# Patient Record
Sex: Female | Born: 1955 | Race: White | Hispanic: No | Smoking: Never smoker
Health system: Southern US, Community
[De-identification: ages and names within clinical notes are randomized; demographics above are authoritative.]

## PROBLEM LIST (undated history)

## (undated) DIAGNOSIS — G4730 Sleep apnea, unspecified: Secondary | ICD-10-CM

## (undated) DIAGNOSIS — B019 Varicella without complication: Secondary | ICD-10-CM

## (undated) DIAGNOSIS — M1990 Unspecified osteoarthritis, unspecified site: Secondary | ICD-10-CM

## (undated) DIAGNOSIS — K219 Gastro-esophageal reflux disease without esophagitis: Secondary | ICD-10-CM

## (undated) DIAGNOSIS — R011 Cardiac murmur, unspecified: Secondary | ICD-10-CM

## (undated) DIAGNOSIS — I340 Nonrheumatic mitral (valve) insufficiency: Secondary | ICD-10-CM

## (undated) DIAGNOSIS — I4819 Other persistent atrial fibrillation: Secondary | ICD-10-CM

## (undated) DIAGNOSIS — G4733 Obstructive sleep apnea (adult) (pediatric): Secondary | ICD-10-CM

## (undated) DIAGNOSIS — I4891 Unspecified atrial fibrillation: Secondary | ICD-10-CM

## (undated) DIAGNOSIS — I428 Other cardiomyopathies: Secondary | ICD-10-CM

## (undated) DIAGNOSIS — K3189 Other diseases of stomach and duodenum: Secondary | ICD-10-CM

## (undated) DIAGNOSIS — Z87442 Personal history of urinary calculi: Secondary | ICD-10-CM

## (undated) DIAGNOSIS — T753XXA Motion sickness, initial encounter: Secondary | ICD-10-CM

## (undated) DIAGNOSIS — E119 Type 2 diabetes mellitus without complications: Secondary | ICD-10-CM

## (undated) DIAGNOSIS — M25559 Pain in unspecified hip: Secondary | ICD-10-CM

## (undated) DIAGNOSIS — E669 Obesity, unspecified: Secondary | ICD-10-CM

## (undated) DIAGNOSIS — I10 Essential (primary) hypertension: Secondary | ICD-10-CM

## (undated) DIAGNOSIS — I499 Cardiac arrhythmia, unspecified: Secondary | ICD-10-CM

## (undated) DIAGNOSIS — I5022 Chronic systolic (congestive) heart failure: Secondary | ICD-10-CM

## (undated) HISTORY — DX: Chronic systolic (congestive) heart failure: I50.22

## (undated) HISTORY — DX: Essential (primary) hypertension: I10

## (undated) HISTORY — DX: Nonrheumatic mitral (valve) insufficiency: I34.0

## (undated) HISTORY — DX: Unspecified osteoarthritis, unspecified site: M19.90

## (undated) HISTORY — DX: Pain in unspecified hip: M25.559

## (undated) HISTORY — DX: Sleep apnea, unspecified: G47.30

## (undated) HISTORY — DX: Other diseases of stomach and duodenum: K31.89

## (undated) HISTORY — DX: Other persistent atrial fibrillation: I48.19

## (undated) HISTORY — DX: Type 2 diabetes mellitus without complications: E11.9

## (undated) HISTORY — DX: Other cardiomyopathies: I42.8

## (undated) HISTORY — DX: Varicella without complication: B01.9

## (undated) HISTORY — DX: Unspecified atrial fibrillation: I48.91

## (undated) HISTORY — DX: Obstructive sleep apnea (adult) (pediatric): G47.33

## (undated) HISTORY — DX: Personal history of urinary calculi: Z87.442

## (undated) HISTORY — PX: CHOLECYSTECTOMY: SHX55

---

## 2009-05-05 ENCOUNTER — Ambulatory Visit: Payer: Self-pay | Admitting: Gastroenterology

## 2010-11-06 LAB — HM MAMMOGRAPHY

## 2011-07-24 ENCOUNTER — Ambulatory Visit: Payer: Self-pay

## 2011-07-24 LAB — RAPID INFLUENZA A&B ANTIGENS

## 2011-07-31 ENCOUNTER — Ambulatory Visit: Payer: Self-pay

## 2011-07-31 LAB — CBC WITH DIFFERENTIAL/PLATELET
Basophil #: 0.1 10*3/uL (ref 0.0–0.1)
Basophil %: 0.9 %
Eosinophil #: 0.2 10*3/uL (ref 0.0–0.7)
Eosinophil %: 2 %
HCT: 50 % — ABNORMAL HIGH (ref 35.0–47.0)
HGB: 17 g/dL — ABNORMAL HIGH (ref 12.0–16.0)
Lymphocyte #: 2.4 10*3/uL (ref 1.0–3.6)
Lymphocyte %: 30.1 %
MCH: 30.6 pg (ref 26.0–34.0)
MCHC: 34 g/dL (ref 32.0–36.0)
MCV: 90 fL (ref 80–100)
Monocyte #: 0.5 10*3/uL (ref 0.0–0.7)
Monocyte %: 6.5 %
Neutrophil #: 4.6 10*3/uL (ref 1.4–6.5)
Neutrophil %: 60.5 %
Platelet: 301 10*3/uL (ref 150–440)
RBC: 5.57 10*6/uL — ABNORMAL HIGH (ref 3.80–5.20)
RDW: 11.8 % (ref 11.5–14.5)
WBC: 7.8 10*3/uL (ref 3.6–11.0)

## 2011-07-31 LAB — COMPREHENSIVE METABOLIC PANEL
Albumin: 4.5 g/dL (ref 3.4–5.0)
Alkaline Phosphatase: 108 U/L (ref 50–136)
Anion Gap: 9 (ref 7–16)
BUN: 11 mg/dL (ref 7–18)
Bilirubin,Total: 0.5 mg/dL (ref 0.2–1.0)
Calcium, Total: 9.7 mg/dL (ref 8.5–10.1)
Chloride: 104 mmol/L (ref 98–107)
Co2: 28 mmol/L (ref 21–32)
Creatinine: 1.04 mg/dL (ref 0.60–1.30)
EGFR (African American): >60
EGFR (Non-African Amer.): 58 — ABNORMAL LOW
Glucose: 122 mg/dL — ABNORMAL HIGH (ref 65–99)
Osmolality: 282 (ref 275–301)
Potassium: 4.8 mmol/L (ref 3.5–5.1)
SGOT(AST): 27 U/L (ref 15–37)
SGPT (ALT): 63 U/L
Sodium: 141 mmol/L (ref 136–145)
Total Protein: 8.4 g/dL — ABNORMAL HIGH (ref 6.4–8.2)

## 2011-07-31 LAB — RAPID INFLUENZA A&B ANTIGENS

## 2012-08-28 ENCOUNTER — Ambulatory Visit: Payer: Self-pay | Admitting: Family Medicine

## 2012-08-28 LAB — RAPID INFLUENZA A&B ANTIGENS

## 2013-07-14 ENCOUNTER — Emergency Department: Payer: Self-pay | Admitting: Emergency Medicine

## 2013-08-19 ENCOUNTER — Ambulatory Visit: Payer: Self-pay | Admitting: Emergency Medicine

## 2013-08-19 LAB — RAPID STREP-A WITH REFLX: Micro Text Report: NEGATIVE

## 2013-08-21 LAB — BETA STREP CULTURE(ARMC)

## 2014-10-14 LAB — HM PAP SMEAR: HM Pap smear: NEGATIVE

## 2016-04-07 ENCOUNTER — Ambulatory Visit
Admission: EM | Admit: 2016-04-07 | Discharge: 2016-04-07 | Disposition: A | Discharge status: Home / Self Care | Payer: BC Managed Care – PPO | Attending: Family Medicine | Admitting: Family Medicine

## 2016-04-07 ENCOUNTER — Encounter: Payer: Self-pay | Admitting: Emergency Medicine

## 2016-04-07 DIAGNOSIS — J40 Bronchitis, not specified as acute or chronic: Secondary | ICD-10-CM

## 2016-04-07 MED ORDER — AZITHROMYCIN 250 MG PO TABS: 250.0000 mg | ORAL_TABLET | Freq: Every day | ORAL | 0 refills | Status: DC

## 2016-04-07 NOTE — ED Triage Notes (Signed)
Patient c/o cough and chest congestion since Tuesday.  Patient denies fevers.

## 2016-04-07 NOTE — ED Provider Notes (Signed)
[facility]    CSN: [id number] Arrival date & time: 04/07/16  [id number]     History   Chief Complaint Chief Complaint  Patient presents with  . Cough    HPI Brandy [name] is a 60 y.o. female.   Subjective:   Brandy [name] is a 60 y.o. female who presents for evaluation of productive cough. Symptoms began 3 day ago. Symptoms have been gradually worsening since that time. Past history is significant for nothing.  The following portions of the patient's history were reviewed and updated as appropriate: allergies, current medications, past family history, past medical history, past social history, past surgical history and problem list.  Review of Systems Pertinent items noted in HPI and remainder of comprehensive ROS otherwise negative.   Objective:  BP (!) 120/98 (BP Location: Left Arm)   Pulse 72   Temp 98.7 F (37.1 C) (Oral)   Resp 16   Ht 5\' 7"  (1.702 m)   Wt 250 lb (113.4 kg)   SpO2 98%   BMI 39.16 kg/m   General Appearance:    Alert, cooperative, no distress, appears stated age Head:    Normocephalic, without obvious abnormality, atraumatic Eyes:    PERRL, conjunctiva/corneas clear, EOM's intact, fundi    benign, both eyes Ears:    Normal TM's and external ear canals, both ears Nose:   Nares normal, septum midline, mucosa normal, no drainage    or sinus tenderness Throat:   Lips, mucosa, and tongue normal; teeth and gums normal Neck:   Supple, symmetrical, trachea midline, no adenopathy;    thyroid:  no enlargement/tenderness/nodules; no carotid   bruit or JVD  Lungs:     Clear to auscultation bilaterally, respirations unlabored Chest Wall:    No tenderness or deformity  Heart:    Regular rate and rhythm, S1 and S2 normal, no murmur, rub   or gallop  Abdomen:     Soft, non-tender, bowel sounds active all four quadrants,    no masses, no organomegaly   Extremities:   Extremities normal, atraumatic, no cyanosis or edema Pulses:   2+ and symmetric all  extremities Skin:   Skin color, texture, turgor normal, no rashes or lesions Lymph nodes:   Cervical, supraclavicular, and axillary nodes norm      Assessment:  Acute Bronchitis   Plan:  Worsening signs and symptoms discussed. Rest, fluids, acetaminophen, and humidification. Follow up as needed for persistent, worsening cough, or appearance of new symptoms.       History reviewed. No pertinent past medical history.  There are no active problems to display for this patient.   Past Surgical History:  Procedure Laterality Date  . CHOLECYSTECTOMY      OB History    No data available       Home Medications    Prior to Admission medications   Medication Sig Start Date End Date Taking? Authorizing Provider  azithromycin (ZITHROMAX) 250 MG tablet Take 1 tablet (250 mg total) by mouth daily. Take first 2 tablets together, then 1 every day until finished. 04/07/16   Juline Patch, MD    Family History History reviewed. No pertinent family history.  Social History Social History  Substance Use Topics  . Smoking status: Never Smoker  . Smokeless tobacco: Never Used  . Alcohol use No     Allergies   Patient has no known allergies.   Review of Systems Review of Systems  Constitutional: Negative for chills, fatigue and fever.  HENT: Negative for congestion, postnasal drip, sinus pain, sinus pressure, sneezing and sore throat.   Eyes: Negative for discharge and redness.  Respiratory: Negative for apnea, choking, chest tightness, shortness of breath and wheezing.   Cardiovascular: Negative for chest pain and leg swelling.  Gastrointestinal: Negative for abdominal distention and abdominal pain.  Endocrine: Negative.   Neurological: Negative for dizziness.  Psychiatric/Behavioral: The patient is not nervous/anxious.      Physical Exam Triage Vital Signs ED Triage Vitals  Enc Vitals Group     BP 04/07/16 0838 (!) 120/98     Pulse Rate 04/07/16 0838 72      Resp 04/07/16 0838 16     Temp 04/07/16 0838 98.7 F (37.1 C)     Temp Source 04/07/16 0838 Oral     SpO2 04/07/16 0838 98 %     Weight 04/07/16 0837 250 lb (113.4 kg)     Height 04/07/16 0837 5\' 7"  (1.702 m)     Head Circumference --      Peak Flow --      Pain Score 04/07/16 0840 2     Pain Loc --      Pain Edu? --      Excl. in [place]? --    No data found.   Updated Vital Signs BP (!) 120/98 (BP Location: Left Arm)   Pulse 72   Temp 98.7 F (37.1 C) (Oral)   Resp 16   Ht 5\' 7"  (1.702 m)   Wt 250 lb (113.4 kg)   SpO2 98%   BMI 39.16 kg/m   Visual Acuity Right Eye Distance:   Left Eye Distance:   Bilateral Distance:    Right Eye Near:   Left Eye Near:    Bilateral Near:     Physical Exam  Constitutional: She appears well-developed.  HENT:  Head: Normocephalic.  Right Ear: External ear normal.  Left Ear: External ear normal.  Nose: Nose normal.  Mouth/Throat: Oropharynx is clear and moist.  Eyes: Conjunctivae and EOM are normal. Pupils are equal, round, and reactive to light.  Neck: Normal range of motion. Neck supple.  Cardiovascular: Normal rate, regular rhythm, normal heart sounds and intact distal pulses.   Pulmonary/Chest: Effort normal and breath sounds normal.  Abdominal: Soft. Bowel sounds are normal. She exhibits no mass. There is no tenderness.     UC Treatments / Results  Labs (all labs ordered are listed, but only abnormal results are displayed) Labs Reviewed - No data to display  EKG  EKG Interpretation None       Radiology No results found.  Procedures Procedures (including critical care time)  Medications Ordered in UC Medications - No data to display   Initial Impression / Assessment and Plan / UC Course  I have reviewed the triage vital signs and the nursing notes.  Pertinent labs & imaging results that were available during my care of the patient were reviewed by me and considered in my medical decision making (see chart for  details).  Clinical Course       Final Clinical Impressions(s) / UC Diagnoses   Final diagnoses:  Bronchitis    New Prescriptions New Prescriptions   AZITHROMYCIN (ZITHROMAX) 250 MG TABLET    Take 1 tablet (250 mg total) by mouth daily. Take first 2 tablets together, then 1 every day until finished.     Juline Patch, MD 04/07/16 [phone]

## 2016-04-12 ENCOUNTER — Emergency Department: Payer: BC Managed Care – PPO

## 2016-04-12 ENCOUNTER — Inpatient Hospital Stay
Admission: EM | Admit: 2016-04-12 | Discharge: 2016-04-18 | DRG: 286 | Disposition: A | Discharge status: Home / Self Care | Payer: BC Managed Care – PPO | Attending: Internal Medicine | Admitting: Internal Medicine

## 2016-04-12 DIAGNOSIS — I428 Other cardiomyopathies: Secondary | ICD-10-CM

## 2016-04-12 DIAGNOSIS — R000 Tachycardia, unspecified: Secondary | ICD-10-CM | POA: Diagnosis present

## 2016-04-12 DIAGNOSIS — J40 Bronchitis, not specified as acute or chronic: Secondary | ICD-10-CM | POA: Diagnosis not present

## 2016-04-12 DIAGNOSIS — I5021 Acute systolic (congestive) heart failure: Secondary | ICD-10-CM | POA: Diagnosis present

## 2016-04-12 DIAGNOSIS — Z6833 Body mass index (BMI) 33.0-33.9, adult: Secondary | ICD-10-CM

## 2016-04-12 DIAGNOSIS — R0603 Acute respiratory distress: Secondary | ICD-10-CM | POA: Diagnosis present

## 2016-04-12 DIAGNOSIS — I4819 Other persistent atrial fibrillation: Secondary | ICD-10-CM | POA: Diagnosis present

## 2016-04-12 DIAGNOSIS — Z6838 Body mass index (BMI) 38.0-38.9, adult: Secondary | ICD-10-CM

## 2016-04-12 DIAGNOSIS — I481 Persistent atrial fibrillation: Secondary | ICD-10-CM | POA: Diagnosis not present

## 2016-04-12 DIAGNOSIS — J811 Chronic pulmonary edema: Secondary | ICD-10-CM | POA: Diagnosis present

## 2016-04-12 DIAGNOSIS — I4891 Unspecified atrial fibrillation: Secondary | ICD-10-CM | POA: Diagnosis not present

## 2016-04-12 DIAGNOSIS — R0602 Shortness of breath: Secondary | ICD-10-CM

## 2016-04-12 DIAGNOSIS — I5042 Chronic combined systolic (congestive) and diastolic (congestive) heart failure: Secondary | ICD-10-CM

## 2016-04-12 DIAGNOSIS — I420 Dilated cardiomyopathy: Secondary | ICD-10-CM | POA: Diagnosis not present

## 2016-04-12 DIAGNOSIS — J810 Acute pulmonary edema: Secondary | ICD-10-CM | POA: Diagnosis not present

## 2016-04-12 DIAGNOSIS — Z9049 Acquired absence of other specified parts of digestive tract: Secondary | ICD-10-CM | POA: Diagnosis not present

## 2016-04-12 HISTORY — DX: Obesity, unspecified: E66.9

## 2016-04-12 LAB — CBC WITH DIFFERENTIAL/PLATELET
Basophils Absolute: 0.1 10*3/uL (ref 0–0.1)
Basophils Relative: 1 %
Eosinophils Absolute: 0.1 10*3/uL (ref 0–0.7)
Eosinophils Relative: 1 %
HCT: 42.7 % (ref 35.0–47.0)
Hemoglobin: 14.6 g/dL (ref 12.0–16.0)
Lymphocytes Relative: 25 %
Lymphs Abs: 2.5 10*3/uL (ref 1.0–3.6)
MCH: 30.3 pg (ref 26.0–34.0)
MCHC: 34.2 g/dL (ref 32.0–36.0)
MCV: 88.7 fL (ref 80.0–100.0)
Monocytes Absolute: 1.1 10*3/uL — ABNORMAL HIGH (ref 0.2–0.9)
Monocytes Relative: 11 %
Neutro Abs: 6.1 10*3/uL (ref 1.4–6.5)
Neutrophils Relative %: 62 %
Platelets: 340 10*3/uL (ref 150–440)
RBC: 4.82 MIL/uL (ref 3.80–5.20)
RDW: 13.2 % (ref 11.5–14.5)
WBC: 10 10*3/uL (ref 3.6–11.0)

## 2016-04-12 LAB — BASIC METABOLIC PANEL
Anion gap: 7 (ref 5–15)
BUN: 16 mg/dL (ref 6–20)
CO2: 25 mmol/L (ref 22–32)
Calcium: 9.2 mg/dL (ref 8.9–10.3)
Chloride: 106 mmol/L (ref 101–111)
Creatinine, Ser: 1.08 mg/dL — ABNORMAL HIGH (ref 0.44–1.00)
GFR calc Af Amer: >60 mL/min (ref >60)
GFR calc non Af Amer: 54 mL/min — ABNORMAL LOW (ref >60)
Glucose, Bld: 122 mg/dL — ABNORMAL HIGH (ref 65–99)
Potassium: 4.1 mmol/L (ref 3.5–5.1)
Sodium: 138 mmol/L (ref 135–145)

## 2016-04-12 LAB — GLUCOSE, CAPILLARY: Glucose-Capillary: 123 mg/dL — ABNORMAL HIGH (ref 65–99)

## 2016-04-12 LAB — TROPONIN I
Troponin I: <0.03 ng/mL (ref <0.03)
Troponin I: <0.03 ng/mL (ref <0.03)

## 2016-04-12 LAB — BRAIN NATRIURETIC PEPTIDE: B Natriuretic Peptide: 297 pg/mL — ABNORMAL HIGH (ref 0.0–100.0)

## 2016-04-12 LAB — TSH: TSH: 3.04 u[IU]/mL (ref 0.350–4.500)

## 2016-04-12 MED ORDER — ASPIRIN 81 MG PO CHEW
324.0000 mg | CHEWABLE_TABLET | Freq: Once | ORAL | Status: AC
  Administered 2016-04-12: 324 mg via ORAL
  Filled 2016-04-12: qty 4

## 2016-04-12 MED ORDER — DILTIAZEM HCL 25 MG/5ML IV SOLN
20.0000 mg | Freq: Once | INTRAVENOUS | Status: AC
  Administered 2016-04-12: 20 mg via INTRAVENOUS
  Filled 2016-04-12: qty 5

## 2016-04-12 MED ORDER — ACETAMINOPHEN 325 MG PO TABS
650.0000 mg | ORAL_TABLET | Freq: Four times a day (QID) | ORAL | Status: DC | PRN
  Administered 2016-04-17: 650 mg via ORAL
  Filled 2016-04-12: qty 2

## 2016-04-12 MED ORDER — ALBUTEROL SULFATE (2.5 MG/3ML) 0.083% IN NEBU
5.0000 mg | INHALATION_SOLUTION | Freq: Once | RESPIRATORY_TRACT | Status: AC
  Administered 2016-04-12: 5 mg via RESPIRATORY_TRACT
  Filled 2016-04-12: qty 6

## 2016-04-12 MED ORDER — ONDANSETRON HCL 4 MG PO TABS: 4.0000 mg | ORAL_TABLET | Freq: Four times a day (QID) | ORAL | Status: DC | PRN

## 2016-04-12 MED ORDER — GUAIFENESIN-DM 100-10 MG/5ML PO SYRP
5.0000 mL | ORAL_SOLUTION | ORAL | Status: DC | PRN
  Administered 2016-04-12 – 2016-04-13 (×3): 5 mL via ORAL
  Filled 2016-04-12 (×3): qty 5

## 2016-04-12 MED ORDER — FUROSEMIDE 10 MG/ML IJ SOLN: 20.0000 mg | Freq: Once | INTRAMUSCULAR | Status: DC

## 2016-04-12 MED ORDER — ONDANSETRON HCL 4 MG/2ML IJ SOLN
4.0000 mg | Freq: Four times a day (QID) | INTRAMUSCULAR | Status: DC | PRN
Start: 2016-04-12 — End: 2016-04-18

## 2016-04-12 MED ORDER — ACETAMINOPHEN 650 MG RE SUPP: 650.0000 mg | Freq: Four times a day (QID) | RECTAL | Status: DC | PRN

## 2016-04-12 MED ORDER — DILTIAZEM HCL 100 MG IV SOLR
5.0000 mg/h | INTRAVENOUS | Status: DC
  Administered 2016-04-12: 5 mg/h via INTRAVENOUS
  Administered 2016-04-13: 13 mg/h via INTRAVENOUS
  Filled 2016-04-12 (×2): qty 100

## 2016-04-12 MED ORDER — IPRATROPIUM-ALBUTEROL 0.5-2.5 (3) MG/3ML IN SOLN
3.0000 mL | RESPIRATORY_TRACT | Status: DC | PRN
  Administered 2016-04-13 (×2): 3 mL via RESPIRATORY_TRACT
  Filled 2016-04-12 (×2): qty 3

## 2016-04-12 MED ORDER — OXYCODONE HCL 5 MG PO TABS: 5.0000 mg | ORAL_TABLET | ORAL | Status: DC | PRN

## 2016-04-12 MED ORDER — HYDROCORTISONE 1 % EX CREA
TOPICAL_CREAM | CUTANEOUS | Status: DC | PRN
  Administered 2016-04-12: via TOPICAL
  Filled 2016-04-12: qty 28

## 2016-04-12 MED ORDER — SODIUM CHLORIDE 0.9 % IV BOLUS (SEPSIS)
500.0000 mL | Freq: Once | INTRAVENOUS | Status: AC
  Administered 2016-04-12: 500 mL via INTRAVENOUS

## 2016-04-12 MED ORDER — SODIUM CHLORIDE 0.9% FLUSH
3.0000 mL | Freq: Two times a day (BID) | INTRAVENOUS | Status: DC
  Administered 2016-04-12 – 2016-04-18 (×9): 3 mL via INTRAVENOUS

## 2016-04-12 MED ORDER — DILTIAZEM HCL 100 MG IV SOLR
5.0000 mg/h | Freq: Once | INTRAVENOUS | Status: DC
  Filled 2016-04-12: qty 100

## 2016-04-12 MED ORDER — ENOXAPARIN SODIUM 120 MG/0.8ML ~~LOC~~ SOLN: 1.0000 mg/kg | Freq: Two times a day (BID) | SUBCUTANEOUS | Status: DC

## 2016-04-12 NOTE — H&P (Signed)
[facility] [place] NAME: Brandy [name]    MR#:  [id number]  DATE OF BIRTH:  1956-04-04   DATE OF ADMISSION:  04/12/2016  PRIMARY CARE PHYSICIAN: Elgie Collard, MD   REQUESTING/REFERRING PHYSICIAN: McShane  CHIEF COMPLAINT:   Chief Complaint  Patient presents with  . Shortness of Breath    HISTORY OF PRESENT ILLNESS:  Brandy [name]  is a 60 y.o. female without significant medical history who is presenting with shortness of breath. She states she recently has recovered from bronchitis received a Z-Pak but shortness of breath continued mainly dyspnea on exertion worsening over the past 2-3 days. She also states "I had a funny feeling in my chest" as her breathing became more symptomatic she is at present to Hospital further workup and evaluation where she was found to have heart rates in the 140s to 150s.    PAST MEDICAL HISTORY:  History reviewed. No pertinent past medical history.  PAST SURGICAL HISTORY:   Past Surgical History:  Procedure Laterality Date  . CHOLECYSTECTOMY      SOCIAL HISTORY:   Social History  Substance Use Topics  . Smoking status: Never Smoker  . Smokeless tobacco: Never Used  . Alcohol use No    FAMILY HISTORY:   Family History  Problem Relation Age of Onset  . Diabetes Neg Hx     DRUG ALLERGIES:  No Known Allergies  REVIEW OF SYSTEMS:  REVIEW OF SYSTEMS:  CONSTITUTIONAL: Denies fevers, chills, fatigue, weakness.  EYES: Denies blurred vision, double vision, or eye pain.  EARS, NOSE, THROAT: Denies tinnitus, ear pain, hearing loss.  RESPIRATORY: denies cough, shortness of breath, wheezing Positive dyspnea on exertion CARDIOVASCULAR: Denies chest pain, positive palpitations, denies edema.  GASTROINTESTINAL: Denies nausea, vomiting, diarrhea, abdominal pain.  GENITOURINARY: Denies dysuria, hematuria.  ENDOCRINE: Denies nocturia or thyroid problems. HEMATOLOGIC AND LYMPHATIC: Denies easy  bruising or bleeding.  SKIN: Denies rash or lesions.  MUSCULOSKELETAL: Denies pain in neck, back, shoulder, knees, hips, or further arthritic symptoms.  NEUROLOGIC: Denies paralysis, paresthesias.  PSYCHIATRIC: Denies anxiety or depressive symptoms. Otherwise full review of systems performed by me is negative.   MEDICATIONS AT HOME:   Prior to Admission medications   Medication Sig Start Date End Date Taking? Authorizing Provider  azithromycin (ZITHROMAX) 250 MG tablet Take 1 tablet (250 mg total) by mouth daily. Take first 2 tablets together, then 1 every day until finished. 04/07/16   Juline Patch, MD      VITAL SIGNS:  Blood pressure 118/89, pulse (!) 106, temperature 97.5 F (36.4 C), temperature source Oral, resp. rate 10, height 5\' 7"  (1.702 m), weight 112.5 kg (248 lb), SpO2 100 %.  PHYSICAL EXAMINATION:  VITAL SIGNS: Vitals:   04/12/16 1859 04/12/16 1901  BP:  118/89  Pulse: (!) 124 (!) 106  Resp: 17 10  Temp:     GENERAL:60 y.o.female currently in no acute distress.  HEAD: Normocephalic, atraumatic.  EYES: Pupils equal, round, reactive to light. Extraocular muscles intact. No scleral icterus.  MOUTH: Moist mucosal membrane. Dentition intact. No abscess noted.  EAR, NOSE, THROAT: Clear without exudates. No external lesions.  NECK: Supple. No thyromegaly. No nodules. No JVD.  PULMONARY: Clear to ascultation, without wheeze rails or rhonci. No use of accessory muscles, Good respiratory effort. good air entry bilaterally CHEST: Nontender to palpation.  CARDIOVASCULAR: S1 and S2. Irregular rate and rhythm. No murmurs, rubs, or gallops. No edema. Pedal pulses 2+ bilaterally.  GASTROINTESTINAL:  Soft, nontender, nondistended. No masses. Positive bowel sounds. No hepatosplenomegaly.  MUSCULOSKELETAL: No swelling, clubbing, or edema. Range of motion full in all extremities.  NEUROLOGIC: Cranial nerves II through XII are intact. No gross focal neurological deficits. Sensation  intact. Reflexes intact.  SKIN: No ulceration, lesions, rashes, or cyanosis. Skin warm and dry. Turgor intact.  PSYCHIATRIC: Mood, affect within normal limits. The patient is awake, alert and oriented x 3. Insight, judgment intact.    LABORATORY PANEL:   CBC  Recent Labs Lab 04/12/16 1818  WBC 10.0  HGB 14.6  HCT 42.7  PLT 340   ------------------------------------------------------------------------------------------------------------------  Chemistries   Recent Labs Lab 04/12/16 1818  NA 138  K 4.1  CL 106  CO2 25  GLUCOSE 122*  BUN 16  CREATININE 1.08*  CALCIUM 9.2   ------------------------------------------------------------------------------------------------------------------  Cardiac Enzymes  Recent Labs Lab 04/12/16 1818  TROPONINI <0.03   ------------------------------------------------------------------------------------------------------------------  RADIOLOGY:  Dg Chest Port 1 View  Result Date: 04/12/2016 CLINICAL DATA:  Bronchitis.  Shortness of breath. EXAM: PORTABLE CHEST 1 VIEW COMPARISON:  Two-view chest x-ray 07/31/2011. FINDINGS: The heart size is exaggerated by low lung volumes. Moderate pulmonary vascular congestion and mild edema are present. Bibasilar airspace disease likely reflects atelectasis. The visualized soft tissues and bony thorax are unremarkable. IMPRESSION: 1. Borderline cardiomegaly with moderate pulmonary vascular congestion and mild bilateral edema. 2. Mild bibasilar airspace disease likely reflects atelectasis. Electronically Signed   By: San Morelle M.D.   On: 04/12/2016 18:48    EKG:   Orders placed or performed during the hospital encounter of 04/12/16  . EKG 12-Lead  . EKG 12-Lead  . ED EKG  . ED EKG    IMPRESSION AND PLAN:   60 year old Caucasian female without significant history presenting with A. fib RVR  1. New onset atrial fibrillation rapid ventricular response: chadsvasc: 1-low risk, start  Cardizem for rate control, cardiology consult, echocardiogram, troponins    All the records are reviewed and case discussed with ED provider. Management plans discussed with the patient, family and they are in agreement.  CODE STATUS: Full  TOTAL TIME TAKING CARE OF THIS PATIENT: 33 critical minutes.    Hower,  Karenann Cai.D on 04/12/2016 at 8:15 PM  Between 7am to 6pm - Pager - [phone]  After 6pm: House Pager: - [phone]  [facility]  Office  [phone]  CC: Primary care physician; Elgie Collard, MD

## 2016-04-12 NOTE — ED Triage Notes (Signed)
Pt states last week she was dx with bronchitis and given z-pack. Finished zpack yesterday. Still have SOB with exertion. Taking mucinex. Has not taken today. HR jumping around in triage, from 120-140. Pt denies CP, N&V, diarrhea, fever. Says if she sits still she is fine.

## 2016-04-12 NOTE — ED Notes (Signed)
Pt lying on side, resting comfortably. Daughter in room.

## 2016-04-12 NOTE — ED Provider Notes (Signed)
[facility] Emergency Department Provider Note  ____________________________________________   I have reviewed the triage vital signs and the nursing notes.   HISTORY  Chief Complaint Shortness of Breath    HPI Brandy [name] is a 60 y.o. female was baseline healthy. Patient has had a cough for the last week. She took antibiotics for this. The cough is actually improving, however, today she noted palpitations. Patient has had palpitations in the past.  She has never had palpitations last for any length of time, however she's felt "weird since this morning and had definite palpitations since this afternoon at around noon. She's had no chest pain. However she does have dyspnea on exertion which is worse than she has had with this cold. She denies fever or chills.      History reviewed. No pertinent past medical history.  There are no active problems to display for this patient.   Past Surgical History:  Procedure Laterality Date  . CHOLECYSTECTOMY      Prior to Admission medications   Medication Sig Start Date End Date Taking? Authorizing Provider  azithromycin (ZITHROMAX) 250 MG tablet Take 1 tablet (250 mg total) by mouth daily. Take first 2 tablets together, then 1 every day until finished. 04/07/16   Juline Patch, MD    Allergies Patient has no known allergies.  History reviewed. No pertinent family history.  Social History Social History  Substance Use Topics  . Smoking status: Never Smoker  . Smokeless tobacco: Never Used  . Alcohol use No    Review of Systems }Constitutional: No fever/chills Eyes: No visual changes. ENT: No sore throat. No stiff neck no neck pain Cardiovascular: Denies chest pain. Respiratory: Positive shortness of breath. Gastrointestinal:   no vomiting.  No diarrhea.  No constipation. Genitourinary: Negative for dysuria. Musculoskeletal: Negative lower extremity swelling Skin: Negative for  rash. Neurological: Negative for severe headaches, focal weakness or numbness. 10-point ROS otherwise negative.  ____________________________________________   PHYSICAL EXAM:  VITAL SIGNS: ED Triage Vitals [04/12/16 1815]  Enc Vitals Group     BP 129/85     Pulse Rate (!) 141     Resp 18     Temp 97.5 F (36.4 C)     Temp Source Oral     SpO2 98 %     Weight 248 lb (112.5 kg)     Height 5\' 7"  (1.702 m)     Head Circumference      Peak Flow      Pain Score 0     Pain Loc      Pain Edu?      Excl. in [place]?     Constitutional: Alert and oriented. Well appearing and in no acute distress. Eyes: Conjunctivae are normal. PERRL. EOMI. Head: Atraumatic. Nose: No congestion/rhinnorhea. Mouth/Throat: Mucous membranes are moist.  Oropharynx non-erythematous. Neck: No stridor.   Nontender with no meningismus Cardiovascular: Riccardi irregularly irregular. Grossly normal heart sounds.  Good peripheral circulation. Respiratory: Normal respiratory effort.  No retractions. Lungs CTAB. Abdominal: Soft and nontender. No distention. No guarding no rebound Back:  There is no focal tenderness or step off.  there is no midline tenderness there are no lesions noted. there is no CVA tenderness Musculoskeletal: No lower extremity tenderness, no upper extremity tenderness. No joint effusions, no DVT signs strong distal pulses no edema Neurologic:  Normal speech and language. No gross focal neurologic deficits are appreciated.  Skin:  Skin is warm, dry and intact. No  rash noted. Psychiatric: Mood and affect are normal. Speech and behavior are normal.  ____________________________________________   LABS (all labs ordered are listed, but only abnormal results are displayed)  Labs Reviewed  TROPONIN I  BRAIN NATRIURETIC PEPTIDE  CBC WITH DIFFERENTIAL/PLATELET  BASIC METABOLIC PANEL   ____________________________________________  EKG  I personally interpreted any EKGs ordered by me or  triage Atrial fibrillation rate 134 bpm no acute ST elevation or acute ST depression normal axis ____________________________________________  RADIOLOGY  I reviewed any imaging ordered by me or triage that were performed during my shift and, if possible, patient and/or family made aware of any abnormal findings. ____________________________________________   PROCEDURES  Procedure(s) performed: None  Procedures  Critical Care performed: None  ____________________________________________   INITIAL IMPRESSION / ASSESSMENT AND PLAN / ED COURSE  Pertinent labs & imaging results that were available during my care of the patient were reviewed by me and considered in my medical decision making (see chart for details).  Patient in A. fib with RVR, has had apparently proximal A. fib in the past presents again with A. fib at this time. She's not having chest pain. She is however having exertional dyspnea. This apparently comes shortly after a URI which is resolving. We will obtain customary blood work including TSH, I will give her Cardizem to bring her heart rate down, her pressures are good. We will give her aspirin to start with that she has a low chads score and we'll reassess.  Clinical Course    ____________________________________________   FINAL CLINICAL IMPRESSION(S) / ED DIAGNOSES  Final diagnoses:  SOB (shortness of breath)      This chart was dictated using voice recognition software.  Despite best efforts to proofread,  errors can occur which can change meaning.      Schuyler Amor, MD 04/12/16 [phone]

## 2016-04-13 ENCOUNTER — Inpatient Hospital Stay (HOSPITAL_COMMUNITY)
Admit: 2016-04-13 | Discharge: 2016-04-13 | Disposition: A | Discharge status: Home / Self Care | Payer: BC Managed Care – PPO | Attending: Internal Medicine | Admitting: Internal Medicine

## 2016-04-13 ENCOUNTER — Encounter: Payer: Self-pay | Admitting: Physician Assistant

## 2016-04-13 DIAGNOSIS — J810 Acute pulmonary edema: Secondary | ICD-10-CM

## 2016-04-13 DIAGNOSIS — J811 Chronic pulmonary edema: Secondary | ICD-10-CM | POA: Diagnosis present

## 2016-04-13 DIAGNOSIS — I4891 Unspecified atrial fibrillation: Secondary | ICD-10-CM

## 2016-04-13 DIAGNOSIS — J40 Bronchitis, not specified as acute or chronic: Secondary | ICD-10-CM | POA: Diagnosis present

## 2016-04-13 DIAGNOSIS — R0603 Acute respiratory distress: Secondary | ICD-10-CM | POA: Diagnosis present

## 2016-04-13 LAB — MRSA PCR SCREENING: MRSA by PCR: NEGATIVE

## 2016-04-13 LAB — CBC
HCT: 42.7 % (ref 35.0–47.0)
Hemoglobin: 14.4 g/dL (ref 12.0–16.0)
MCH: 29.8 pg (ref 26.0–34.0)
MCHC: 33.6 g/dL (ref 32.0–36.0)
MCV: 88.7 fL (ref 80.0–100.0)
Platelets: 310 10*3/uL (ref 150–440)
RBC: 4.82 MIL/uL (ref 3.80–5.20)
RDW: 13.7 % (ref 11.5–14.5)
WBC: 12.6 10*3/uL — ABNORMAL HIGH (ref 3.6–11.0)

## 2016-04-13 LAB — ECHOCARDIOGRAM COMPLETE
Height: 67 in
Weight: 3950.64 oz

## 2016-04-13 LAB — MAGNESIUM: Magnesium: 2.1 mg/dL (ref 1.7–2.4)

## 2016-04-13 LAB — BASIC METABOLIC PANEL
Anion gap: 8 (ref 5–15)
BUN: 15 mg/dL (ref 6–20)
CO2: 20 mmol/L — ABNORMAL LOW (ref 22–32)
Calcium: 8.6 mg/dL — ABNORMAL LOW (ref 8.9–10.3)
Chloride: 111 mmol/L (ref 101–111)
Creatinine, Ser: 0.98 mg/dL (ref 0.44–1.00)
GFR calc Af Amer: >60 mL/min (ref >60)
GFR calc non Af Amer: >60 mL/min (ref >60)
Glucose, Bld: 149 mg/dL — ABNORMAL HIGH (ref 65–99)
Potassium: 3.7 mmol/L (ref 3.5–5.1)
Sodium: 139 mmol/L (ref 135–145)

## 2016-04-13 LAB — TROPONIN I
Troponin I: <0.03 ng/mL (ref <0.03)
Troponin I: <0.03 ng/mL (ref <0.03)

## 2016-04-13 LAB — PROTIME-INR
INR: 1.15
Prothrombin Time: 14.8 seconds (ref 11.4–15.2)

## 2016-04-13 LAB — APTT: aPTT: 31 seconds (ref 24–36)

## 2016-04-13 LAB — HEPARIN LEVEL (UNFRACTIONATED): Heparin Unfractionated: 0.19 IU/mL — ABNORMAL LOW (ref 0.30–0.70)

## 2016-04-13 MED ORDER — HEPARIN BOLUS VIA INFUSION
5000.0000 [IU] | Freq: Once | INTRAVENOUS | Status: AC
  Administered 2016-04-13: 5000 [IU] via INTRAVENOUS
  Filled 2016-04-13: qty 5000

## 2016-04-13 MED ORDER — FUROSEMIDE 10 MG/ML IJ SOLN
20.0000 mg | Freq: Two times a day (BID) | INTRAMUSCULAR | Status: DC
  Administered 2016-04-13 – 2016-04-16 (×7): 20 mg via INTRAVENOUS
  Filled 2016-04-13 (×7): qty 2

## 2016-04-13 MED ORDER — METOPROLOL TARTRATE 25 MG PO TABS
12.5000 mg | ORAL_TABLET | Freq: Four times a day (QID) | ORAL | Status: DC
  Administered 2016-04-13: 12.5 mg via ORAL
  Filled 2016-04-13: qty 1

## 2016-04-13 MED ORDER — POTASSIUM CHLORIDE CRYS ER 10 MEQ PO TBCR
10.0000 meq | EXTENDED_RELEASE_TABLET | Freq: Two times a day (BID) | ORAL | Status: DC
  Administered 2016-04-13 – 2016-04-16 (×8): 10 meq via ORAL
  Filled 2016-04-13 (×8): qty 1

## 2016-04-13 MED ORDER — HEPARIN (PORCINE) IN NACL 100-0.45 UNIT/ML-% IJ SOLN
1600.0000 [IU]/h | INTRAMUSCULAR | Status: DC
  Administered 2016-04-13: 1300 [IU]/h via INTRAVENOUS
  Administered 2016-04-14 – 2016-04-16 (×5): 1600 [IU]/h via INTRAVENOUS
  Filled 2016-04-13 (×7): qty 250

## 2016-04-13 MED ORDER — LORAZEPAM 0.5 MG PO TABS
0.5000 mg | ORAL_TABLET | ORAL | Status: DC | PRN
  Administered 2016-04-13 (×2): 0.5 mg via ORAL
  Filled 2016-04-13 (×2): qty 1

## 2016-04-13 MED ORDER — ESMOLOL HCL-SODIUM CHLORIDE 2000 MG/100ML IV SOLN
25.0000 ug/kg/min | INTRAVENOUS | Status: DC
  Administered 2016-04-13: 50 ug/kg/min via INTRAVENOUS
  Administered 2016-04-13: 500 ug/kg/min via INTRAVENOUS
  Administered 2016-04-13: 25 ug/kg/min via INTRAVENOUS
  Administered 2016-04-14: 50 ug/kg/min via INTRAVENOUS
  Administered 2016-04-14: 25 ug/kg/min via INTRAVENOUS
  Administered 2016-04-14: 50 ug/kg/min via INTRAVENOUS
  Filled 2016-04-13 (×3): qty 100

## 2016-04-13 MED ORDER — HEPARIN BOLUS VIA INFUSION
2600.0000 [IU] | Freq: Once | INTRAVENOUS | Status: AC
  Administered 2016-04-13: 2600 [IU] via INTRAVENOUS
  Filled 2016-04-13: qty 2600

## 2016-04-13 NOTE — Progress Notes (Signed)
   EF found to be reduced at 15% on echo. Discussed with MD. Plan to start esmolol infusion in place of Cardizem gtt and PO Lopressor. Titrate to goal HR < 100 bpm. Will need to continue IV Lasix bid with KCl repletion given pleural effusion seen on echo. Will need R/LHC prior to discharge after her heart rate is improved as well as she has been diuresed. Cardiac cath likely on Monday 12/4.

## 2016-04-13 NOTE — Progress Notes (Signed)
ANTICOAGULATION CONSULT NOTE - Initial Consult  Pharmacy Consult for heparin bolus and drip Indication: atrial fibrillation  No Known Allergies  Patient Measurements: Height: 5\' 7"  (170.2 cm) Weight: 246 lb 14.6 oz (112 kg) IBW/kg (Calculated) : 61.6 Heparin Dosing Weight: 87.5 kg  Vital Signs: Temp: 97.2 F (36.2 C) (11/30 1907) Temp Source: Oral (11/30 1907) BP: 99/70 (11/30 1930) Pulse Rate: 124 (11/30 1930)  Labs:  Recent Labs  04/12/16 1818 04/12/16 2246 04/13/16 0450 04/13/16 1033 04/13/16 1845  HGB 14.6  --  14.4  --   --   HCT 42.7  --  42.7  --   --   PLT 340  --  310  --   --   APTT  --   --   --  31  --   LABPROT  --   --   --  14.8  --   INR  --   --   --  1.15  --   HEPARINUNFRC  --   --   --   --  0.19*  CREATININE 1.08*  --  0.98  --   --   TROPONINI <0.03 <0.03 <0.03 <0.03  --     Estimated Creatinine Clearance: 77.8 mL/min (by C-G formula based on SCr of 0.98 mg/dL).   Medical History: Past Medical History:  Diagnosis Date  . Obesity     Medications:  Scheduled:  . furosemide  20 mg Intravenous BID  . potassium chloride  10 mEq Oral BID  . sodium chloride flush  3 mL Intravenous Q12H   Infusions:  . esmolol 50 mcg/kg/min (04/13/16 1907)  . heparin 1,300 Units/hr (04/13/16 1907)    Assessment: Pharmacy has been consulted to dose heparin bolus and drip in this 17 yoF with ?new onset afib with RVR, uncertain when pt went into Afib as she was having increased SOB throughout the previous week. Rate has been difficult to control. Per cardio note, heparin has been initiated in case pt requires DCCV prior to discharge and due to low CHADS2VASc (1), pt should not require long term anticoagulation. According to PTA medication list, patient was not taking OC prior to admission. Baseline labs WNL.  Goal of Therapy:  Heparin level 0.3-0.7 units/ml Monitor platelets by anticoagulation protocol: Yes   Plan:  Give 5000 units bolus x 1 Start  heparin infusion at 1300 units/hr Check anti-Xa level in 6 hours and daily while on heparin Continue to monitor H&H and platelets   11/30:  HL @ 17:00 = 0.19 Will order heparin 2600 units IV X 1 bolus and increase drip rate to 1600 units.  Will recheck HL 6 hrs after rate change.   [place] Resident 04/13/2016 9:49 PM

## 2016-04-13 NOTE — Progress Notes (Signed)
[facility] [place] NAME: Brandy [name]    MR#:  [id number]  DATE OF BIRTH:  06-16-1955  SUBJECTIVE:seen at bed side.admitted for SOB,afib with RVR.still having cough.HR not controlled with cardizem drip.  CHIEF COMPLAINT:   Chief Complaint  Patient presents with  . Shortness of Breath    REVIEW OF SYSTEMS:   ROS CONSTITUTIONAL: fatigue. EYES: No blurred or double vision.  EARS, NOSE, AND THROAT: No tinnitus or ear pain.  RESPIRATORY: No cough, shortness of breath, wheezing or hemoptysis.  CARDIOVASCULAR: No chest pain, orthopnea, edema.  GASTROINTESTINAL: No nausea, vomiting, diarrhea or abdominal pain.  GENITOURINARY: No dysuria, hematuria.  ENDOCRINE: No polyuria, nocturia,  HEMATOLOGY: No anemia, easy bruising or bleeding SKIN: No rash or lesion. MUSCULOSKELETAL: No joint pain or arthritis.   NEUROLOGIC: No tingling, numbness, weakness.  PSYCHIATRY: No anxiety or depression.   DRUG ALLERGIES:  No Known Allergies  VITALS:  Blood pressure 102/60, pulse (!) 135, temperature 98.3 F (36.8 C), temperature source Axillary, resp. rate (!) 25, height 5\' 7"  (1.702 m), weight 112 kg (246 lb 14.6 oz), SpO2 94 %.  PHYSICAL EXAMINATION:  GENERAL:  60 y.o.-year-old patient lying in the bed with no acute distress.  EYES: Pupils equal, round, reactive to light and accommodation. No scleral icterus. Extraocular muscles intact.  HEENT: Head atraumatic, normocephalic. Oropharynx and nasopharynx clear.  NECK:  Supple, no jugular venous distention. No thyroid enlargement, no tenderness.  LUNGS: bilateral basal creps .  CARDIOVASCULAR: S1, S2 irregular,tachycardic.[name] No murmurs, rubs, or gallops.  ABDOMEN: Soft, nontender, nondistended. Bowel sounds present. No organomegaly or mass.  EXTREMITIES: No pedal edema, cyanosis, or clubbing.  NEUROLOGIC: Cranial nerves II through XII are intact. Muscle strength 5/5 in all extremities. Sensation  intact. Gait not checked.  PSYCHIATRIC: The patient is alert and oriented x 3.  SKIN: No obvious rash, lesion, or ulcer.    LABORATORY PANEL:   CBC  Recent Labs Lab 04/13/16 0450  WBC 12.6*  HGB 14.4  HCT 42.7  PLT 310   ------------------------------------------------------------------------------------------------------------------  Chemistries   Recent Labs Lab 04/13/16 0450 04/13/16 1033  NA 139  --   K 3.7  --   CL 111  --   CO2 20*  --   GLUCOSE 149*  --   BUN 15  --   CREATININE 0.98  --   CALCIUM 8.6*  --   MG  --  2.1   ------------------------------------------------------------------------------------------------------------------  Cardiac Enzymes  Recent Labs Lab 04/13/16 1033  TROPONINI <0.03   ------------------------------------------------------------------------------------------------------------------  RADIOLOGY:  Dg Chest Port 1 View  Result Date: 04/12/2016 CLINICAL DATA:  Bronchitis.  Shortness of breath. EXAM: PORTABLE CHEST 1 VIEW COMPARISON:  Two-view chest x-ray 07/31/2011. FINDINGS: The heart size is exaggerated by low lung volumes. Moderate pulmonary vascular congestion and mild edema are present. Bibasilar airspace disease likely reflects atelectasis. The visualized soft tissues and bony thorax are unremarkable. IMPRESSION: 1. Borderline cardiomegaly with moderate pulmonary vascular congestion and mild bilateral edema. 2. Mild bibasilar airspace disease likely reflects atelectasis. Electronically Signed   By: San Morelle M.D.   On: 04/12/2016 18:48    EKG:   Orders placed or performed during the hospital encounter of 04/12/16  . EKG 12-Lead  . EKG 12-Lead  . ED EKG  . ED EKG    ASSESSMENT AND PLAN:  1.acute systolic chf;EF 0000000.pt needs cadiac cath after  Her chf and afib gets  Better. 2.afib with RVR;started on esmolol  drip.on heparin drip. 3.pulmonary edema;on lasix,replace k 4.acute respiratory distress;;due to  afib with RVR,pulmonary edema. Critical care time;35 min  Appreciate cardio following   All the records are reviewed and case discussed with Care Management/Social Workerr. Management plans discussed with the patient, family and they are in agreement.  CODE STATUS: full  TOTAL TIME TAKING CARE OF THIS PATIENT:35 minutes. CCT  POSSIBLE D/C IN  1-2DAYS, DEPENDING ON CLINICAL CONDITION.   Epifanio Lesches M.D on 04/13/2016 at 5:12 PM  Between 7am to 6pm - Pager - [phone]  After 6pm go to [email or web address] - password [email or web address] [facility]  Office  [phone]  CC: Primary care physician; Elgie Collard, MD   Note: This dictation was prepared with Dragon dictation along with smaller phrase technology. Any transcriptional errors that result from this process are unintentional.

## 2016-04-13 NOTE — Progress Notes (Signed)
Patient had spell of severe shortness of breath while resting in bed. Patient was experiencing tachypnea and anxiety she felt. Patient was reassured and nasal cannula was placed back on patient (patient had removed). Assisted patient to sit on side of bed and dangle. Upon respiratory ausculation the had clear lung sounds, visual increased work of breathing was noted. Reassured and stayed with patient until the shortness of breath was resolved. Respiratory therapy gave an intervention to patient (see eMAR) I notified the hospitalist of the change in patient condition. Will await for new order to be placed in computer. Patient has vital signs within normal limits and resting in bed.

## 2016-04-13 NOTE — Progress Notes (Signed)
ANTICOAGULATION CONSULT NOTE - Initial Consult  Pharmacy Consult for heparin bolus and drip Indication: atrial fibrillation  No Known Allergies  Patient Measurements: Height: 5\' 7"  (170.2 cm) Weight: 246 lb 14.6 oz (112 kg) IBW/kg (Calculated) : 61.6 Heparin Dosing Weight: 87.5 kg  Vital Signs: Temp: 98.3 F (36.8 C) (11/30 1000) Temp Source: Axillary (11/30 1000) BP: 120/62 (11/30 0600) Pulse Rate: 105 (11/30 0600)  Labs:  Recent Labs  04/12/16 1818 04/12/16 2246 04/13/16 0450 04/13/16 1033  HGB 14.6  --  14.4  --   HCT 42.7  --  42.7  --   PLT 340  --  310  --   APTT  --   --   --  31  LABPROT  --   --   --  14.8  INR  --   --   --  1.15  CREATININE 1.08*  --  0.98  --   TROPONINI <0.03 <0.03 <0.03  --     Estimated Creatinine Clearance: 77.8 mL/min (by C-G formula based on SCr of 0.98 mg/dL).   Medical History: Past Medical History:  Diagnosis Date  . Obesity     Medications:  Scheduled:  . diltiazem (CARDIZEM) infusion  5-15 mg/hr Intravenous Once  . furosemide  20 mg Intravenous BID  . heparin  5,000 Units Intravenous Once  . metoprolol tartrate  12.5 mg Oral Q6H  . potassium chloride  10 mEq Oral BID  . sodium chloride flush  3 mL Intravenous Q12H   Infusions:  . diltiazem (CARDIZEM) infusion 15 mg/hr (04/13/16 0625)  . heparin      Assessment: Pharmacy has been consulted to dose heparin bolus and drip in this 19 yoF with ?new onset afib with RVR, uncertain when pt went into Afib as she was having increased SOB throughout the previous week. Rate has been difficult to control. Per cardio note, heparin has been initiated in case pt requires DCCV prior to discharge and due to low CHADS2VASc (1), pt should not require long term anticoagulation. According to PTA medication list, patient was not taking OC prior to admission. Baseline labs WNL.  Goal of Therapy:  Heparin level 0.3-0.7 units/ml Monitor platelets by anticoagulation protocol: Yes    Plan:  Give 5000 units bolus x 1 Start heparin infusion at 1300 units/hr Check anti-Xa level in 6 hours and daily while on heparin Continue to monitor H&H and platelets  Darrow Bussing, PharmD Pharmacy Resident 04/13/2016 11:09 AM

## 2016-04-13 NOTE — Consult Note (Signed)
Cardiology Consultation Note  Patient ID: Brandy [name], MRN: [id number], DOB/AGE: 1955-06-27 60 y.o. Admit date: 04/12/2016   Date of Consult: 04/13/2016 Primary Physician: Elgie Collard, MD Primary Cardiologist: New to [facility] Requesting Physician: Dr. Lavetta Nielsen, MD  Chief Complaint: SOB Reason for Consult: New onset Afib with RVR  HPI: 60 y.o. female with h/o obesity who does not seek medical attention in the outpatient setting routinely was recently diagnosed with bronchitis on 11/24 and started on a Z pack. 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  No previously known cardiac history. Patient noted a URI during the first part of the prior week. Cough and SOB worsened over the Thanksgiving holiday prompting her to be seen as an outpatient on 11/24 and she was diagnosed with bronchitis and started on a Z pack which she finished on 11/28. Cough has improved some. 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 No palpitations, chest pain, diaphoresis, nausea, vomiting, dizziness, presyncope or syncope. Never with orthopnea, early satiety, or LE swelling. Because of her SOB requiring frequent breaks she presented to the hospital for further evaluation. Never with similar symptoms. No recent long travels. No history of blood clots.   Upon the patient's arrival to [facility] they were found to have troponin negative x 3, SCr 0.98, K+ 3.7, WBC 12.6, hgb 14.4, plt 310, tsh 3.040, BNP 297. ECG showed Afib with RVR, 134 bpm, no acute st/t changes, CXR showed moderate pulmonary vascular congestion. She was ordered to received IV Lasix 20 mg x 1 on 11/29, though it appears this was not given upon chart review.  Magnesium is pending. She was started on IV Cardizem gtt currently at 15 mg/hr with heart rates continuing to run in the 1-teen to 130 bpm range. No palpitations this morning. Remains on nasal cannula at 2 L with some SOB.    Past Medical History:  Diagnosis Date  . Obesity       Most Recent Cardiac Studies: Echo pending   Surgical History:  Past Surgical History:  Procedure Laterality Date  . CHOLECYSTECTOMY       Home Meds: Prior to Admission medications   Medication Sig Start Date End Date Taking? Authorizing Provider  Barberry-Oreg Grape-Goldenseal (BERBERINE COMPLEX PO) Take 1 capsule by mouth 2 (two) times daily.   Yes Historical Provider, MD  Cholecalciferol (VITAMIN D3) 5000 units CAPS Take 1 capsule by mouth daily.   Yes Historical Provider, MD  Cranberry 250 MG CAPS Take 1 capsule by mouth 2 (two) times daily.   Yes Historical Provider, MD  Menaquinone-7 (VITAMIN K2 PO) Take 1 tablet by mouth 2 (two) times daily.   Yes Historical Provider, MD    Inpatient Medications:  . diltiazem (CARDIZEM) infusion  5-15 mg/hr Intravenous Once  . furosemide  20 mg Intravenous BID  . heparin  5,000 Units Intravenous Once  . metoprolol tartrate  12.5 mg Oral Q6H  . potassium chloride  10 mEq Oral BID  . sodium chloride flush  3 mL Intravenous Q12H   . diltiazem (CARDIZEM) infusion 15 mg/hr (04/13/16 0625)  . heparin      Allergies: No Known Allergies  Social History  Social History  . Marital status: Widowed    Spouse name: N/A  . Number of children: N/A  . Years of education: N/A   Occupational History  . Not on file.   Social History Main Topics  . Smoking status: Never Smoker  . Smokeless tobacco: Never Used  . Alcohol use No  . Drug use: Unknown  . Sexual activity: Not on file   Other Topics Concern  . Not on file   Social History Narrative  . No narrative on file     Family History  Problem Relation Age of Onset  . COPD Mother   . COPD Father   .  Heart disease Father   . Diabetes Neg Hx      Review of Systems: Review of Systems  Constitutional: Positive for malaise/fatigue. Negative for chills, diaphoresis, fever and weight loss.  HENT: Negative for congestion.   Eyes: Negative for discharge and redness.  Respiratory: Positive for cough, sputum production and shortness of breath. Negative for hemoptysis and wheezing.   Cardiovascular: Negative for chest pain, palpitations, orthopnea, claudication, leg swelling and PND.  Gastrointestinal: Negative for abdominal pain, blood in stool, heartburn, melena, nausea and vomiting.  Genitourinary: Negative for hematuria.  Musculoskeletal: Negative for falls and myalgias.  Skin: Negative for rash.  Neurological: Positive for weakness. Negative for dizziness, tingling, tremors, sensory change, speech change, focal weakness and loss of consciousness.  Endo/Heme/Allergies: Does not bruise/bleed easily.  Psychiatric/Behavioral: Negative for substance abuse. The patient is not nervous/anxious.   All other systems reviewed and are negative.   Labs:  Recent Labs  04/12/16 1818 04/12/16 2246 04/13/16 0450  TROPONINI <0.03 <0.03 <0.03   Lab Results  Component Value Date   WBC 12.6 (H) 04/13/2016   HGB 14.4 04/13/2016   HCT 42.7 04/13/2016   MCV 88.7 04/13/2016   PLT 310 04/13/2016     Recent Labs Lab 04/13/16 0450  NA 139  K 3.7  CL 111  CO2 20*  BUN 15  CREATININE 0.98  CALCIUM 8.6*  GLUCOSE 149*   No results found for: CHOL, HDL, LDLCALC, TRIG No results found for: DDIMER  Radiology/Studies:  Dg Chest Port 1 View  Result Date: 04/12/2016 CLINICAL DATA:  Bronchitis.  Shortness of breath. EXAM: PORTABLE CHEST 1 VIEW COMPARISON:  Two-view chest x-ray 07/31/2011. FINDINGS: The heart size is exaggerated by low lung volumes. Moderate pulmonary vascular congestion and mild edema are present. Bibasilar airspace disease likely reflects atelectasis. The visualized soft tissues  and bony thorax are unremarkable. IMPRESSION: 1. Borderline cardiomegaly with moderate pulmonary vascular congestion and mild bilateral edema. 2. Mild bibasilar airspace disease likely reflects atelectasis. Electronically Signed   By: San Morelle M.D.   On: 04/12/2016 18:48    EKG: Interpreted by me showed: Afib with RVR, 134 bpm, no acute st/t changes Telemetry: Interpreted by me showed: Afib with RVR, 1-teens to 130's bpm  Weights: Filed Weights   04/12/16 1815 04/12/16 2200  Weight: 248 lb (112.5 kg) 246 lb 14.6 oz (112 kg)     Physical Exam: Blood pressure 120/62, pulse (!) 105, temperature 98.1 F (36.7 C), temperature source Axillary, resp. rate (!) 38, height 5\' 7"  (1.702 m), weight 246 lb 14.6 oz (112 kg), SpO2 93 %. Body mass index is 38.67 kg/m. General: Well developed, well nourished, in no acute distress. Head: Normocephalic, atraumatic, sclera non-icteric, no xanthomas, nares are without discharge.  Neck: Negative for carotid bruits. JVD not elevated. Lungs: Clear bilaterally to auscultation without  wheezes, rales, or rhonchi. Breathing is unlabored. On nasal cannula at 2 L.  Heart: Tachycardic, irregularly irregular with S1 S2. No murmurs, rubs, or gallops appreciated. Abdomen: Soft, non-tender, non-distended with normoactive bowel sounds. No hepatomegaly. No rebound/guarding. No obvious abdominal masses. Msk:  Strength and tone appear normal for age. Extremities: No clubbing or cyanosis. No edema. Distal pedal pulses are 2+ and equal bilaterally. Neuro: Alert and oriented X 3. No facial asymmetry. No focal deficit. Moves all extremities spontaneously. Psych:  Responds to questions appropriately with a normal affect.    Assessment and Plan:  Principal Problem:   New onset atrial fibrillation (HCC) Active Problems:   Acute respiratory distress   Pulmonary edema   Bronchitis    1. Acute respiratory distress: -Likely multifactorial including bronchitis,  Afib with RVR, and pulmonary edema -Remains on 2 L oxygen via nasal cannula, wean as able per primary team -No known risk factors for DVT/PE at this time  2. New onset Afib with RVR: -Likely in the setting of recent bronchitis and volume overload with pulmonary edema -Rate is somewhat difficult to control at this time -Currently on IV Cardizem gtt at 15 mg/hr with heart rates continuing to run in the 1-teen to 130 bpm range -Add Lopressor 12.5 mg q 6 hours for added rate control -Uncertain exactly when she went into Afib as she was dealing with increase SOB into the prior week, though this significantly worsened on 11/29 we cannot provide this is when she went into this rhythm -Start heparin gtt in case she requires DCCV prior to discharge -Should she require DCCV to restore sinus rhythm she will require short-term DOAC -In the long run, she should not require long term anticoagulation given her current CHADS2VASc of 1 (sex category) -If echo shows reduced EF she would require anticoagulation until this improves with restoration of sinus rhythm   3. Pulmonary edema: -Likely exacerbated by bronchitis and Afib with RVR -IV Lasix 20 mg bid today with KCl repletion  4. Bronchitis: -Per IM    Signed, Christell Faith, PA-C [facility] Pager: [phone] 04/13/2016, 10:18 AM

## 2016-04-13 NOTE — Progress Notes (Signed)
Spoke with lab regarding HL timed for 1700 - has not been drawn yet.  Lenis Noon, PharmD Clinical Pharmacist 04/13/16 6:59 PM

## 2016-04-13 NOTE — Progress Notes (Signed)
*  PRELIMINARY RESULTS* Echocardiogram 2D Echocardiogram has been performed.  Brandy [name] 04/13/2016, 11:52 AM

## 2016-04-13 NOTE — Plan of Care (Signed)
Problem: Education: Goal: Knowledge of [facility] Education information/materials will improve Outcome: Progressing BP 102/60   Pulse (!) 135   Temp 98.3 F (36.8 C) (Axillary)   Resp (!) 25   Ht 5\' 7"  (1.702 m)   Wt 112 kg (246 lb 14.6 oz)   SpO2 94%   BMI 38.67 kg/m   POC reviewed with patient, cont on q2 turns, vital signs closely monitored and any concerns addressed at this time. Will continue to monitor.    Mental Orientation: A&O x 4 Telemetry: Pt placed on monitor. Central tele and Elink aware of pt's transfer Assessment: Completed Skin: wnl  IV: flushes easily, no pain, no blood return Pain: no pain  Environmental changes completed to facilitate rest and relaxation.  Safety Measures: Bed alarm obn, 2/4 bed rails up.  Unit Orientation: Pt and family oriented to room, has received patient guide, and taught how to use call bell system.  Family: Family  at bedside with pt.   Dc cardizem Will start osmolol

## 2016-04-14 DIAGNOSIS — I428 Other cardiomyopathies: Secondary | ICD-10-CM

## 2016-04-14 DIAGNOSIS — I5042 Chronic combined systolic (congestive) and diastolic (congestive) heart failure: Secondary | ICD-10-CM

## 2016-04-14 DIAGNOSIS — I5021 Acute systolic (congestive) heart failure: Secondary | ICD-10-CM

## 2016-04-14 DIAGNOSIS — Z6833 Body mass index (BMI) 33.0-33.9, adult: Secondary | ICD-10-CM

## 2016-04-14 LAB — BASIC METABOLIC PANEL
Anion gap: 7 (ref 5–15)
BUN: 13 mg/dL (ref 6–20)
CO2: 25 mmol/L (ref 22–32)
Calcium: 8.8 mg/dL — ABNORMAL LOW (ref 8.9–10.3)
Chloride: 107 mmol/L (ref 101–111)
Creatinine, Ser: 0.95 mg/dL (ref 0.44–1.00)
GFR calc Af Amer: >60 mL/min (ref >60)
GFR calc non Af Amer: >60 mL/min (ref >60)
Glucose, Bld: 122 mg/dL — ABNORMAL HIGH (ref 65–99)
Potassium: 3.9 mmol/L (ref 3.5–5.1)
Sodium: 139 mmol/L (ref 135–145)

## 2016-04-14 LAB — CBC
HCT: 41.7 % (ref 35.0–47.0)
Hemoglobin: 14.3 g/dL (ref 12.0–16.0)
MCH: 30.5 pg (ref 26.0–34.0)
MCHC: 34.2 g/dL (ref 32.0–36.0)
MCV: 89.2 fL (ref 80.0–100.0)
Platelets: 259 10*3/uL (ref 150–440)
RBC: 4.68 MIL/uL (ref 3.80–5.20)
RDW: 13.6 % (ref 11.5–14.5)
WBC: 10 10*3/uL (ref 3.6–11.0)

## 2016-04-14 LAB — HEPARIN LEVEL (UNFRACTIONATED)
Heparin Unfractionated: 0.55 IU/mL (ref 0.30–0.70)
Heparin Unfractionated: 0.51 IU/mL (ref 0.30–0.70)

## 2016-04-14 LAB — HEMOGLOBIN A1C
Hgb A1c MFr Bld: 6 % — ABNORMAL HIGH (ref 4.8–5.6)
Mean Plasma Glucose: 126 mg/dL

## 2016-04-14 MED ORDER — METOPROLOL TARTRATE 25 MG PO TABS
12.5000 mg | ORAL_TABLET | Freq: Four times a day (QID) | ORAL | Status: DC
  Administered 2016-04-14 – 2016-04-16 (×9): 12.5 mg via ORAL
  Filled 2016-04-14 (×9): qty 1

## 2016-04-14 MED ORDER — DIGOXIN 250 MCG PO TABS
0.2500 mg | ORAL_TABLET | Freq: Every day | ORAL | Status: DC
Start: 2016-04-15 — End: 2016-04-16
  Administered 2016-04-15 – 2016-04-16 (×2): 0.25 mg via ORAL
  Filled 2016-04-14: qty 1
  Filled 2016-04-14: qty 2

## 2016-04-14 MED ORDER — DIGOXIN 0.25 MG/ML IJ SOLN
0.2500 mg | INTRAMUSCULAR | Status: AC
  Administered 2016-04-14 (×2): 0.25 mg via INTRAVENOUS
  Filled 2016-04-14 (×2): qty 2

## 2016-04-14 NOTE — Progress Notes (Signed)
Patient Name: Brandy [name] Date of Encounter: 04/14/2016  Primary Cardiologist: New (Dr. Fletcher Anon)  [facility] Problem List     Principal Problem:   New onset atrial fibrillation [facility]) Active Problems:   Acute respiratory distress   Pulmonary edema   Bronchitis   Acute systolic CHF (congestive heart failure) (HCC)   Congestive dilated cardiomyopathy ([place])   Morbid obesity ([place])     Subjective   The patient reports improvement in shortness of breath. Ventricular rate was reasonably controlled on esmolol drip but that was discontinued early morning due to blood pressure 82/45. Blood pressure is significantly increased this morning back to the 100 4150 range.   Inpatient Medications    Scheduled Meds: . digoxin  0.25 mg Intravenous Q4H  . [START ON 04/15/2016] digoxin  0.25 mg Oral Daily  . furosemide  20 mg Intravenous BID  . metoprolol tartrate  12.5 mg Oral Q6H  . potassium chloride  10 mEq Oral BID  . sodium chloride flush  3 mL Intravenous Q12H   Continuous Infusions: . esmolol Stopped (04/14/16 0150)  . heparin 1,600 Units/hr (04/14/16 0104)   PRN Meds: acetaminophen **OR** acetaminophen, guaiFENesin-dextromethorphan, hydrocortisone cream, ipratropium-albuterol, LORazepam, ondansetron **OR** ondansetron (ZOFRAN) IV, oxyCODONE   Vital Signs    Vitals:   04/14/16 0230 04/14/16 0300 04/14/16 0330 04/14/16 0600  BP:    123/67  Pulse: (!) 110 (!) 108 (!) 109 (!) 117  Resp: 19 17 19  (!) 21  Temp:      TempSrc:      SpO2: 96% 93% 96% 92%  Weight:      Height:        Intake/Output Summary (Last 24 hours) at 04/14/16 0846 Last data filed at 04/14/16 0600  Gross per 24 hour  Intake          1288.83 ml  Output             3175 ml  Net         -1886.17 ml   Filed Weights   04/12/16 1815 04/12/16 2200  Weight: 248 lb (112.5 kg) 246 lb 14.6 oz (112 kg)    Physical Exam    GEN: Well nourished, well developed, in no acute distress.  HEENT: Grossly normal.  Neck:  Supple, no JVD, carotid bruits, or masses. Cardiac: Irregularly irregular and tachycardic, no murmurs, rubs, or gallops. No clubbing, cyanosis, edema.  Radials/DP/PT 2+ and equal bilaterally.  Respiratory:  Respirations regular and unlabored, slightly diminished breath sounds at the bases GI: Soft, nontender, nondistended, BS + x 4. MS: no deformity or atrophy. Skin: warm and dry, no rash. Neuro:  Strength and sensation are intact. Psych: AAOx3.  Normal affect.  Labs    CBC  Recent Labs  04/12/16 1818 04/13/16 0450 04/14/16 0317  WBC 10.0 12.6* 10.0  NEUTROABS 6.1  --   --   HGB 14.6 14.4 14.3  HCT 42.7 42.7 41.7  MCV 88.7 88.7 89.2  PLT 340 310 [id number]   Basic Metabolic Panel  Recent Labs  04/12/16 1818 04/13/16 0450 04/13/16 1033  NA 138 139  --   K 4.1 3.7  --   CL 106 111  --   CO2 25 20*  --   GLUCOSE 122* 149*  --   BUN 16 15  --   CREATININE 1.08* 0.98  --   CALCIUM 9.2 8.6*  --   MG  --   --  2.1   Liver Function Tests No results  for input(s): AST, ALT, ALKPHOS, BILITOT, PROT, ALBUMIN in the last 72 hours. No results for input(s): LIPASE, AMYLASE in the last 72 hours. Cardiac Enzymes  Recent Labs  04/12/16 2246 04/13/16 0450 04/13/16 1033  TROPONINI <0.03 <0.03 <0.03   BNP Invalid input(s): POCBNP D-Dimer No results for input(s): DDIMER in the last 72 hours. Hemoglobin A1C  Recent Labs  04/12/16 2029  HGBA1C 6.0*   Fasting Lipid Panel No results for input(s): CHOL, HDL, LDLCALC, TRIG, CHOLHDL, LDLDIRECT in the last 72 hours. Thyroid Function Tests  Recent Labs  04/12/16 1818  TSH 3.040    Telemetry    Atrial fibrillation with rapid ventricular response - Personally Reviewed  ECG    Atrial fibrillation with rapid ventricular response. - Personally Reviewed  Radiology    Dg Chest Port 1 View  Result Date: 04/12/2016 CLINICAL DATA:  Bronchitis.  Shortness of breath. EXAM: PORTABLE CHEST 1 VIEW COMPARISON:  Two-view chest x-ray  07/31/2011. FINDINGS: The heart size is exaggerated by low lung volumes. Moderate pulmonary vascular congestion and mild edema are present. Bibasilar airspace disease likely reflects atelectasis. The visualized soft tissues and bony thorax are unremarkable. IMPRESSION: 1. Borderline cardiomegaly with moderate pulmonary vascular congestion and mild bilateral edema. 2. Mild bibasilar airspace disease likely reflects atelectasis. Electronically Signed   By: San Morelle M.D.   On: 04/12/2016 18:48    Cardiac Studies   Echocardiogram was personally reviewed by me which showed moderately dilated left ventricle with severely reduced LV systolic function with an EF of 15% with akinesis of the anterior myocardium, moderate mitral regurgitation, mildly dilated left atrium and left pleural effusion.  Patient Profile     60 year old female with no previous cardiac history who was treated recently for bronchitis and presented with worsening dyspnea. She was found to be in A. fib with RVR and echo showed severely reduced LV systolic function.  Assessment & Plan    1. Atrial fibrillation with rapid ventricular response: Ventricular rate is elevated today after stopping Esmolol drip. We have been somewhat limited by relatively low blood pressure. I resumed Esmolol drip.  I elected to load the patient with IV digoxin and start small dose metoprolol to see if we can wean off the drip. I want to avoid amiodarone for now given that it appears that she has been in atrial fibrillation for a while and I want to avoid the risk of chemical cardioversion without knowing if she has an intracardiac thrombus. Continue anticoagulation with heparin for now. If ventricular rate continues to be difficult to control with medications, she will require a TEE guided cardioversion.  2. Acute systolic heart failure with severely reduced LV systolic function and moderate mitral regurgitation: Most likely, she has  tachycardia-induced cardiomyopathy. However, she does have wall motion abnormalities with akinesis of the anterior myocardium. Due to that, we have to exclude underlying coronary artery disease. I recommend proceeding with a right and left cardiac catheterization which can likely be done on Monday as long as ventricular rate is controlled with no significant orthopnea. Continue gentle diuresis for now. No ACE inhibitor or ARB due to low blood pressure. The priority now is to control ventricular rate.  Signed, Kathlyn Sacramento, MD  04/14/2016, 8:46 AM

## 2016-04-14 NOTE — Progress Notes (Signed)
RN spoke first with Dr. Fletcher Anon with update on patient's condition off esmolol infusion (HR maintaining in 90s-100s, up to 110s when stood up to [facility]). MD wanted RN to wait until 18:00 to move patient but did discontinue esmolol. Then RN spoke with Dr. Vianne Bulls who despite improvement wanted patient to be step down status and re-evualuate tomorrow. Team will continue to monitor.

## 2016-04-14 NOTE — Progress Notes (Signed)
[place] at [place] NAME: Madilyne Limbert    MR#:  [id number]  DATE OF BIRTH:  04-12-1956 says that her SOB is better.esmolol stopped this am due to hypotension.HR up this morning.     Chief Complaint  Patient presents with  . Shortness of Breath    REVIEW OF SYSTEMS:   ROS CONSTITUTIONAL: fatigue. EYES: No blurred or double vision.  EARS, NOSE, AND THROAT: No tinnitus or ear pain.  RESPIRATORY: No cough, shortness of breath, wheezing or hemoptysis.  CARDIOVASCULAR: No chest pain, orthopnea, edema.  GASTROINTESTINAL: No nausea, vomiting, diarrhea or abdominal pain.  GENITOURINARY: No dysuria, hematuria.  ENDOCRINE: No polyuria, nocturia,  HEMATOLOGY: No anemia, easy bruising or bleeding SKIN: No rash or lesion. MUSCULOSKELETAL: No joint pain or arthritis.   NEUROLOGIC: No tingling, numbness, weakness.  PSYCHIATRY: No anxiety or depression.   DRUG ALLERGIES:  No Known Allergies  VITALS:  Blood pressure (!) 96/54, pulse 98, temperature 97.9 F (36.6 C), temperature source Oral, resp. rate (!) 21, height 5\' 7"  (1.702 m), weight 112 kg (246 lb 14.6 oz), SpO2 96 %.  PHYSICAL EXAMINATION:  GENERAL:  60 y.o.-year-old patient lying in the bed with no acute distress.  EYES: Pupils equal, round, reactive to light and accommodation. No scleral icterus. Extraocular muscles intact.  HEENT: Head atraumatic, normocephalic. Oropharynx and nasopharynx clear.  NECK:  Supple, no jugular venous distention. No thyroid enlargement, no tenderness.  LUNGS: bilateral basal creps .  CARDIOVASCULAR: S1, S2 irregular,tachycardic.[name] No murmurs, rubs, or gallops.  ABDOMEN: Soft, nontender, nondistended. Bowel sounds present. No organomegaly or mass.  EXTREMITIES: No pedal edema, cyanosis, or clubbing.  NEUROLOGIC: Cranial nerves II through XII are intact. Muscle strength 5/5 in all extremities. Sensation intact. Gait not checked.  PSYCHIATRIC: The  patient is alert and oriented x 3.  SKIN: No obvious rash, lesion, or ulcer.    LABORATORY PANEL:   CBC  Recent Labs Lab 04/14/16 0317  WBC 10.0  HGB 14.3  HCT 41.7  PLT 259   ------------------------------------------------------------------------------------------------------------------  Chemistries   Recent Labs Lab 04/13/16 1033 04/14/16 0851  NA  --  139  K  --  3.9  CL  --  107  CO2  --  25  GLUCOSE  --  122*  BUN  --  13  CREATININE  --  0.95  CALCIUM  --  8.8*  MG 2.1  --    ------------------------------------------------------------------------------------------------------------------  Cardiac Enzymes  Recent Labs Lab 04/13/16 1033  TROPONINI <0.03   ------------------------------------------------------------------------------------------------------------------  RADIOLOGY:  Dg Chest Port 1 View  Result Date: 04/12/2016 CLINICAL DATA:  Bronchitis.  Shortness of breath. EXAM: PORTABLE CHEST 1 VIEW COMPARISON:  Two-view chest x-ray 07/31/2011. FINDINGS: The heart size is exaggerated by low lung volumes. Moderate pulmonary vascular congestion and mild edema are present. Bibasilar airspace disease likely reflects atelectasis. The visualized soft tissues and bony thorax are unremarkable. IMPRESSION: 1. Borderline cardiomegaly with moderate pulmonary vascular congestion and mild bilateral edema. 2. Mild bibasilar airspace disease likely reflects atelectasis. Electronically Signed   By: San Morelle M.D.   On: 04/12/2016 18:48    EKG:   Orders placed or performed during the hospital encounter of 04/12/16  . EKG 12-Lead  . EKG 12-Lead  . ED EKG  . ED EKG    ASSESSMENT AND PLAN:  1.acute systolic chf;EF 0000000.pt needs cadiac cath after  Her chf and afib gets  Better.no ACe or ARB due to low BP>  2.afib with RVR;started on esmolol drip.on heparin drip.IV digoxin loading.  3.pulmonary edema;on lasix,replace k 4.acute respiratory distress;;due to  afib with RVR,pulmonary edema. Critical care time;35 min  Appreciate cardio following   All the records are reviewed and case discussed with Care Management/Social Workerr. Management plans discussed with the patient, family and they are in agreement.  CODE STATUS: full  no ACE inhibitors or BECAUSE of low blood pressure.  TOTAL TIME TAKING CARE OF THIS PATIENT:35 minutes. CCT  POSSIBLE D/C IN  1-2DAYS, DEPENDING ON CLINICAL CONDITION.   Epifanio Lesches M.D on 04/14/2016 at 3:00 PM  Between 7am to 6pm - Pager - [phone]  After 6pm go to [email or web address] - password [email or web address] [place] [facility]  Office  [phone]  CC: Primary care physician; Elgie Collard, MD   Note: This dictation was prepared with Dragon dictation along with smaller phrase technology. Any transcriptional errors that result from this process are unintentional.

## 2016-04-14 NOTE — Progress Notes (Signed)
Esmolol stopped for SBP 82/48 MAP 59, HR 97.  Spoke to Dr. Ubaldo Glassing. and updated, No new orders at this time.

## 2016-04-14 NOTE — Progress Notes (Signed)
ANTICOAGULATION CONSULT NOTE - Follow Up Consult  Pharmacy Consult for Heparin Drip Indication: atrial fibrillation  No Known Allergies  Patient Measurements: Height: 5\' 7"  (170.2 cm) Weight: 246 lb 14.6 oz (112 kg) IBW/kg (Calculated) : 61.6 Heparin Dosing Weight: 87.5 kg  Vital Signs: BP: 123/67 (12/01 0600) Pulse Rate: 117 (12/01 0600)  Labs:  Recent Labs  04/12/16 1818 04/12/16 2246 04/13/16 0450 04/13/16 1033 04/13/16 1845 04/14/16 0317 04/14/16 0851  HGB 14.6  --  14.4  --   --  14.3  --   HCT 42.7  --  42.7  --   --  41.7  --   PLT 340  --  310  --   --  259  --   APTT  --   --   --  31  --   --   --   LABPROT  --   --   --  14.8  --   --   --   INR  --   --   --  1.15  --   --   --   HEPARINUNFRC  --   --   --   --  0.19* 0.55 0.51  CREATININE 1.08*  --  0.98  --   --   --  0.95  TROPONINI <0.03 <0.03 <0.03 <0.03  --   --   --     Estimated Creatinine Clearance: 80.3 mL/min (by C-G formula based on SCr of 0.95 mg/dL).   Medications:  Infusions:  . esmolol 50 mcg/kg/min (04/14/16 0853)  . heparin 1,600 Units/hr (04/14/16 0104)    Assessment: Pharmacy has been consulted to dose heparin bolus and drip in this 89 yoF with ?new onset afib with RVR, uncertain when pt went into Afib as she was having increased SOB throughout the previous week. Rate has been difficult to control. Per cardio note, heparin has been initiated in case pt requires DCCV prior to discharge and due to low CHADS2VASc (1), pt should not require long term anticoagulation. According to PTA medication list, patient was not taking OC prior to admission. Baseline labs WNL.  12/1 08:51`HL resulted at 0.51  Goal of Therapy:  Heparin level 0.3-0.7 units/ml Monitor platelets by anticoagulation protocol: Yes   Plan:  Will continue with current rate of 1600 units/hr. This is second therapeutic HL, will move to daily monitoring of HL and CBC.  Paulina Fusi, PharmD, BCPS 04/14/2016 9:42 AM

## 2016-04-14 NOTE — Progress Notes (Signed)
ANTICOAGULATION CONSULT NOTE - Initial Consult  Pharmacy Consult for heparin bolus and drip Indication: atrial fibrillation  No Known Allergies  Patient Measurements: Height: 5\' 7"  (170.2 cm) Weight: 246 lb 14.6 oz (112 kg) IBW/kg (Calculated) : 61.6 Heparin Dosing Weight: 87.5 kg  Vital Signs: Temp: 97.2 F (36.2 C) (11/30 1907) Temp Source: Oral (11/30 1907) BP: 133/79 (12/01 0200) Pulse Rate: 110 (12/01 0230)  Labs:  Recent Labs  04/12/16 1818 04/12/16 2246 04/13/16 0450 04/13/16 1033 04/13/16 1845 04/14/16 0317  HGB 14.6  --  14.4  --   --  14.3  HCT 42.7  --  42.7  --   --  41.7  PLT 340  --  310  --   --  259  APTT  --   --   --  31  --   --   LABPROT  --   --   --  14.8  --   --   INR  --   --   --  1.15  --   --   HEPARINUNFRC  --   --   --   --  0.19* 0.55  CREATININE 1.08*  --  0.98  --   --   --   TROPONINI <0.03 <0.03 <0.03 <0.03  --   --     Estimated Creatinine Clearance: 77.8 mL/min (by C-G formula based on SCr of 0.98 mg/dL).   Medical History: Past Medical History:  Diagnosis Date  . Obesity     Medications:  Scheduled:  . furosemide  20 mg Intravenous BID  . potassium chloride  10 mEq Oral BID  . sodium chloride flush  3 mL Intravenous Q12H   Infusions:  . esmolol Stopped (04/14/16 0150)  . heparin 1,600 Units/hr (04/14/16 0104)    Assessment: Pharmacy has been consulted to dose heparin bolus and drip in this 12 yoF with ?new onset afib with RVR, uncertain when pt went into Afib as she was having increased SOB throughout the previous week. Rate has been difficult to control. Per cardio note, heparin has been initiated in case pt requires DCCV prior to discharge and due to low CHADS2VASc (1), pt should not require long term anticoagulation. According to PTA medication list, patient was not taking OC prior to admission. Baseline labs WNL.  Goal of Therapy:  Heparin level 0.3-0.7 units/ml Monitor platelets by anticoagulation protocol:  Yes   Plan:  Give 5000 units bolus x 1 Start heparin infusion at 1300 units/hr Check anti-Xa level in 6 hours and daily while on heparin Continue to monitor H&H and platelets   11/30:  HL @ 17:00 = 0.19 Will order heparin 2600 units IV X 1 bolus and increase drip rate to 1600 units.  Will recheck HL 6 hrs after rate change.   12/1 03:00 heparin level 0.55. Continue current regimen. Recheck in 6 hours to confirm.  [place] Resident 04/14/2016 4:30 AM

## 2016-04-15 DIAGNOSIS — I420 Dilated cardiomyopathy: Secondary | ICD-10-CM

## 2016-04-15 LAB — CBC
HCT: 43.9 % (ref 35.0–47.0)
Hemoglobin: 15 g/dL (ref 12.0–16.0)
MCH: 30.2 pg (ref 26.0–34.0)
MCHC: 34.2 g/dL (ref 32.0–36.0)
MCV: 88.3 fL (ref 80.0–100.0)
Platelets: 283 10*3/uL (ref 150–440)
RBC: 4.97 MIL/uL (ref 3.80–5.20)
RDW: 13.2 % (ref 11.5–14.5)
WBC: 10.1 10*3/uL (ref 3.6–11.0)

## 2016-04-15 LAB — BASIC METABOLIC PANEL
Anion gap: 8 (ref 5–15)
BUN: 16 mg/dL (ref 6–20)
CO2: 24 mmol/L (ref 22–32)
Calcium: 8.8 mg/dL — ABNORMAL LOW (ref 8.9–10.3)
Chloride: 107 mmol/L (ref 101–111)
Creatinine, Ser: 0.89 mg/dL (ref 0.44–1.00)
GFR calc Af Amer: >60 mL/min (ref >60)
GFR calc non Af Amer: >60 mL/min (ref >60)
Glucose, Bld: 103 mg/dL — ABNORMAL HIGH (ref 65–99)
Potassium: 3.6 mmol/L (ref 3.5–5.1)
Sodium: 139 mmol/L (ref 135–145)

## 2016-04-15 LAB — HEPARIN LEVEL (UNFRACTIONATED): Heparin Unfractionated: 0.44 IU/mL (ref 0.30–0.70)

## 2016-04-15 LAB — DIGOXIN LEVEL: Digoxin Level: 0.5 ng/mL — ABNORMAL LOW (ref 0.8–2.0)

## 2016-04-15 NOTE — Progress Notes (Signed)
Pt transferred to room from ICU. Tele box verified with Jeri NT and CCMD #[id number]. Skin assessment with Casey Burkitt. Patient oriented to room, staff, and equipment. Safety plan discussed. Patient has IV heparin drip infusing, but refuses second IV site. Patient resting quietly. Will continue to monitor.

## 2016-04-15 NOTE — Progress Notes (Signed)
[place] at [place] NAME: Brandy [name]    MR#:  [id number]  DATE OF BIRTH:  Jul 15, 1955  SUBJECTIVE:  CHIEF COMPLAINT:   Chief Complaint  Patient presents with  . Shortness of Breath   - admitted with new onset afib, HR still elevated - BP is softer, so meds cannot be increased now - feels asymptomatic - ECHO with wall low EF and wall motion changes- so cath scheduled for Monday - on heparin drip  REVIEW OF SYSTEMS:  Review of Systems  Constitutional: Negative for chills, fever and malaise/fatigue.  HENT: Negative for congestion, ear discharge, hearing loss, nosebleeds and sore throat.   Respiratory: Negative for cough, shortness of breath and wheezing.   Cardiovascular: Negative for chest pain, palpitations and leg swelling.  Gastrointestinal: Negative for abdominal pain, constipation, diarrhea, nausea and vomiting.  Genitourinary: Negative for dysuria and urgency.  Musculoskeletal: Negative for myalgias.  Neurological: Negative for dizziness, sensory change, speech change, focal weakness, seizures and headaches.  Psychiatric/Behavioral: Negative for depression.    DRUG ALLERGIES:  No Known Allergies  VITALS:  Blood pressure 97/65, pulse 96, temperature 98 F (36.7 C), temperature source Oral, resp. rate (!) 22, height 5\' 7"  (1.702 m), weight 112 kg (246 lb 14.6 oz), SpO2 96 %.  PHYSICAL EXAMINATION:  Physical Exam  GENERAL:  60 y.o.-year-old patient lying in the bed with no acute distress.  EYES: Pupils equal, round, reactive to light and accommodation. No scleral icterus. Extraocular muscles intact.  HEENT: Head atraumatic, normocephalic. Oropharynx and nasopharynx clear.  NECK:  Supple, no jugular venous distention. No thyroid enlargement, no tenderness.  LUNGS: Normal breath sounds bilaterally, no wheezing, rales,rhonchi or crepitation. No use of accessory muscles of respiration. Decreased bibasilar breath  sounds CARDIOVASCULAR: S1, S2 normal. No murmurs, rubs, or gallops.  ABDOMEN: Soft, nontender, nondistended. Bowel sounds present. No organomegaly or mass.  EXTREMITIES: No cyanosis, or clubbing. Varicose veins noted on legs. NEUROLOGIC: Cranial nerves II through XII are intact. Muscle strength 5/5 in all extremities. Sensation intact. Gait not checked.  PSYCHIATRIC: The patient is alert and oriented x 3.  SKIN: No obvious rash, lesion, or ulcer.    LABORATORY PANEL:   CBC  Recent Labs Lab 04/15/16 0431  WBC 10.1  HGB 15.0  HCT 43.9  PLT 283   ------------------------------------------------------------------------------------------------------------------  Chemistries   Recent Labs Lab 04/13/16 1033  04/15/16 0431  NA  --   < > 139  K  --   < > 3.6  CL  --   < > 107  CO2  --   < > 24  GLUCOSE  --   < > 103*  BUN  --   < > 16  CREATININE  --   < > 0.89  CALCIUM  --   < > 8.8*  MG 2.1  --   --   < > = values in this interval not displayed. ------------------------------------------------------------------------------------------------------------------  Cardiac Enzymes  Recent Labs Lab 04/13/16 1033  TROPONINI <0.03   ------------------------------------------------------------------------------------------------------------------  RADIOLOGY:  No results found.  EKG:   Orders placed or performed during the hospital encounter of 04/12/16  . EKG 12-Lead  . EKG 12-Lead    ASSESSMENT AND PLAN:   60y/o F with PMH of Obesity and no other medical problems presents to hospital secondary to worsening shortness of breath and noted to have new onset A. fib with RVR.  #1 new onset atrial fibrillation with rapid ventricular response-started after  a viral bronchitis. -Rate is still elevated. Currently on metoprolol low-dose 12.5 mg every 6 hours.  -dose can be adjusted if blood pressure stays up -Currently on IV heparin for anticoagulation. We'll change to oral  agent after cardiac catheterization on Monday. -If rate cannot be controlled, after adequate anticoagulation, cardioversion can be planned -Appreciate cardiology consult. -Also on digoxin.  #2 acute CHF-systolic dysfunction, echo with wall motion abnormalities and EF is 15%. -Was on esmolol drip in the ICU. Will need a right and left heart catheterization, scheduled for Monday. -Since blood pressure is low, currently only on metoprolol. Medications can be adjusted later. -Continue diuresis with Lasix twice a day  #3 acute respiratory distress on admission-secondary to acute pulmonary edema and CHF exacerbation. -Management as mentioned above. On Lasix. Much improved breathing. Currently on room air.  #4 DVT prophylaxis-on heparin drip     All the records are reviewed and case discussed with Care Management/Social Workerr. Management plans discussed with the patient, family and they are in agreement.  CODE STATUS: Full Code  TOTAL TIME TAKING CARE OF THIS PATIENT: 38 minutes.   POSSIBLE D/C IN 2-3 DAYS, DEPENDING ON CLINICAL CONDITION.   [name] M.D on 04/15/2016 at 2:03 PM  Between 7am to 6pm - Pager - [phone]  After 6pm go to [email or web address] - password [email or web address] [place] [facility]  Office  [phone]  CC: Primary care physician; Elgie Collard, MD

## 2016-04-15 NOTE — Progress Notes (Signed)
ANTICOAGULATION CONSULT NOTE - Follow Up Consult  Pharmacy Consult for Heparin Drip Indication: atrial fibrillation  No Known Allergies  Patient Measurements: Height: 5\' 7"  (170.2 cm) Weight: 246 lb 14.6 oz (112 kg) IBW/kg (Calculated) : 61.6 Heparin Dosing Weight: 87.5 kg  Vital Signs: Temp: 97.7 F (36.5 C) (12/02 0200) Temp Source: Oral (12/02 0200) BP: 97/64 (12/02 0600) Pulse Rate: 92 (12/02 0600)  Labs:  Recent Labs  04/12/16 2246 04/13/16 0450 04/13/16 1033  04/14/16 0317 04/14/16 0851 04/15/16 0431  HGB  --  14.4  --   --  14.3  --  15.0  HCT  --  42.7  --   --  41.7  --  43.9  PLT  --  310  --   --  259  --  283  APTT  --   --  31  --   --   --   --   LABPROT  --   --  14.8  --   --   --   --   INR  --   --  1.15  --   --   --   --   HEPARINUNFRC  --   --   --   < > 0.55 0.51 0.44  CREATININE  --  0.98  --   --   --  0.95 0.89  TROPONINI <0.03 <0.03 <0.03  --   --   --   --   < > = values in this interval not displayed.  Estimated Creatinine Clearance: 85.7 mL/min (by C-G formula based on SCr of 0.89 mg/dL).   Medications:  Infusions:  . heparin 1,600 Units/hr (04/15/16 0600)    Assessment: Pharmacy has been consulted to dose heparin bolus and drip in this 67 yoF with ?new onset afib with RVR, uncertain when pt went into Afib as she was having increased SOB throughout the previous week. Rate has been difficult to control. Per cardio note, heparin has been initiated in case pt requires DCCV prior to discharge and due to low CHADS2VASc (1), pt should not require long term anticoagulation. According to PTA medication list, patient was not taking OC prior to admission. Baseline labs WNL.  12/1 08:51`HL resulted at 0.51  Goal of Therapy:  Heparin level 0.3-0.7 units/ml Monitor platelets by anticoagulation protocol: Yes   Plan:  Will continue with current rate of 1600 units/hr. This is second therapeutic HL, will move to daily monitoring of HL and  CBC.  12/2 AM heparin level 0.44. Continue current regimen. Recheck heparin level and CBC tomorrow AM.  Paulina Fusi, PharmD, BCPS 04/15/2016 6:36 AM

## 2016-04-15 NOTE — Progress Notes (Signed)
Patient transferred to room 236. Report called to [employer]. Patient's medications sent through tube system.

## 2016-04-15 NOTE — Progress Notes (Signed)
Patient Name: Brandy [name] Date of Encounter: 04/15/2016  Primary Cardiologist: new (Dr. Fletcher Anon)  [employer] Problem List     Principal Problem:   New onset atrial fibrillation [facility]) Active Problems:   Acute respiratory distress   Pulmonary edema   Bronchitis   Acute systolic CHF (congestive heart failure) (HCC)   Congestive dilated cardiomyopathy (HCC)   Morbid obesity ([place])    Subjective   Feeling well.  Denies lower extremity edema, orthopnea or shortness of breath  Inpatient Medications    Scheduled Meds: . digoxin  0.25 mg Oral Daily  . furosemide  20 mg Intravenous BID  . metoprolol tartrate  12.5 mg Oral Q6H  . potassium chloride  10 mEq Oral BID  . sodium chloride flush  3 mL Intravenous Q12H   Continuous Infusions: . heparin 1,600 Units/hr (04/15/16 1013)   PRN Meds: acetaminophen **OR** acetaminophen, guaiFENesin-dextromethorphan, hydrocortisone cream, ipratropium-albuterol, LORazepam, ondansetron **OR** ondansetron (ZOFRAN) IV, oxyCODONE   Vital Signs    Vitals:   04/15/16 0901 04/15/16 1030 04/15/16 1100 04/15/16 1145  BP: (!) 101/55 111/78 106/65 97/65  Pulse: 99 (!) 106 (!) 101 96  Resp: (!) 31 18 (!) 27 (!) 22  Temp:    98 F (36.7 C)  TempSrc:    Oral  SpO2: 91% 95% 94% 96%  Weight:      Height:        Intake/Output Summary (Last 24 hours) at 04/15/16 1233 Last data filed at 04/15/16 1000  Gross per 24 hour  Intake           828.32 ml  Output             2552 ml  Net         -1723.68 ml   Filed Weights   04/12/16 1815 04/12/16 2200  Weight: 112.5 kg (248 lb) 112 kg (246 lb 14.6 oz)    Physical Exam   GEN: Well nourished, well developed, in no acute distress.  HEENT: Grossly normal.  Neck: Supple, no JVD, carotid bruits, or masses. Cardiac: Irregularly irregular, no murmurs, rubs, or gallops. No clubbing, cyanosis, edema.  Radials/DP/PT 2+ and equal bilaterally.  Respiratory:  Respirations regular and unlabored, clear to auscultation  bilaterally. GI: Soft, nontender, nondistended, BS + x 4. MS: no deformity or atrophy. Skin: warm and dry, no rash. Neuro:  Strength and sensation are intact. Psych: AAOx3.  Normal affect.  Labs    CBC  Recent Labs  04/12/16 1818  04/14/16 0317 04/15/16 0431  WBC 10.0  < > 10.0 10.1  NEUTROABS 6.1  --   --   --   HGB 14.6  < > 14.3 15.0  HCT 42.7  < > 41.7 43.9  MCV 88.7  < > 89.2 88.3  PLT 340  < > 259 283  < > = values in this interval not displayed. Basic Metabolic Panel  Recent Labs  04/13/16 1033 04/14/16 0851 04/15/16 0431  NA  --  139 139  K  --  3.9 3.6  CL  --  107 107  CO2  --  25 24  GLUCOSE  --  122* 103*  BUN  --  13 16  CREATININE  --  0.95 0.89  CALCIUM  --  8.8* 8.8*  MG 2.1  --   --    Liver Function Tests No results for input(s): AST, ALT, ALKPHOS, BILITOT, PROT, ALBUMIN in the last 72 hours. No results for input(s): LIPASE, AMYLASE in the  last 72 hours. Cardiac Enzymes  Recent Labs  04/12/16 2246 04/13/16 0450 04/13/16 1033  TROPONINI <0.03 <0.03 <0.03   BNP Invalid input(s): POCBNP D-Dimer No results for input(s): DDIMER in the last 72 hours. Hemoglobin A1C  Recent Labs  04/12/16 2029  HGBA1C 6.0*   Fasting Lipid Panel No results for input(s): CHOL, HDL, LDLCALC, TRIG, CHOLHDL, LDLDIRECT in the last 72 hours. Thyroid Function Tests  Recent Labs  04/12/16 1818  TSH 3.040    Telemetry    Atrial fibrillation.  Rates 90s-120s.  Today in the 90s.- Personally Reviewed  ECG    n/a  Radiology    No results found.  Cardiac Studies   Echo 04/13/16: Study Conclusions  - Left ventricle: The cavity size was moderately dilated. Wall   thickness was normal. Systolic function was severely reduced. The   estimated ejection fraction was in the range of 15% to 20%.   Akinesis of the anterior myocardium. Diffuse hypokinesis of other   walls. - Mitral valve: There was moderate regurgitation. - Left atrium: The atrium was  moderately dilated. - Right atrium: The atrium was mildly dilated. - Pericardium, extracardiac: There was a left pleural effusion.  Patient Profile     Brandy [name] is a 1F with no known past medical history who presented with shortness of breath in the setting of URI symptoms and newly diagnosed with atrial fibrillation with RVR and acute systolic and diastolic heart failure.  Assessment & Plan    # Newly diagnosed atrial fibrillation:  Brandy [name]'s heart rates are better controlled.  BP remains soft.  She was started with IV digoxin yesterday and received her first oral dose today.  We will check a level on Monday.  Continue heparin for anticoagulation.  She will need to start an oral agent after her heart cath on Monday.   Continue metoprolol 12.5mg  q6h.   This patients CHA2DS2-VASc Score and unadjusted Ischemic Stroke Rate (% per year) is equal to 2.2 % stroke rate/year from a score of 2  Above score calculated as 1 point each if present [CHF, HTN, DM, Vascular=MI/PAD/Aortic Plaque, Age if 65-74, or Female] Above score calculated as 2 points each if present [Age > 75, or Stroke/TIA/TE]  # Acute systolic and diastolic heart failure:  Brandy [name] was found to have LVEF 15-20% this admission.  It is unclear if this is due to viral cardiomyopathy, tachycardia-induced cardiomyopathy, or ischemia.  Plan for [facility] on Monday.  Continue diuresis with IV lasix given that her renal function has been stable and she is diuresing -1.2L.  Likely switch to oral lasix tomorrow.  She is not on an ACE-I/ARB 2/2 hypotension.   Signed, Brandy Latch, MD  04/15/2016, 12:33 PM

## 2016-04-16 LAB — CBC
HCT: 49.6 % — ABNORMAL HIGH (ref 35.0–47.0)
Hemoglobin: 17 g/dL — ABNORMAL HIGH (ref 12.0–16.0)
MCH: 30.1 pg (ref 26.0–34.0)
MCHC: 34.2 g/dL (ref 32.0–36.0)
MCV: 87.9 fL (ref 80.0–100.0)
Platelets: 333 10*3/uL (ref 150–440)
RBC: 5.64 MIL/uL — ABNORMAL HIGH (ref 3.80–5.20)
RDW: 13.8 % (ref 11.5–14.5)
WBC: 11.2 10*3/uL — ABNORMAL HIGH (ref 3.6–11.0)

## 2016-04-16 LAB — BASIC METABOLIC PANEL
Anion gap: 8 (ref 5–15)
BUN: 18 mg/dL (ref 6–20)
CO2: 24 mmol/L (ref 22–32)
Calcium: 9.4 mg/dL (ref 8.9–10.3)
Chloride: 106 mmol/L (ref 101–111)
Creatinine, Ser: 0.95 mg/dL (ref 0.44–1.00)
GFR calc Af Amer: >60 mL/min (ref >60)
GFR calc non Af Amer: >60 mL/min (ref >60)
Glucose, Bld: 107 mg/dL — ABNORMAL HIGH (ref 65–99)
Potassium: 3.8 mmol/L (ref 3.5–5.1)
Sodium: 138 mmol/L (ref 135–145)

## 2016-04-16 LAB — HEPARIN LEVEL (UNFRACTIONATED): Heparin Unfractionated: 0.54 IU/mL (ref 0.30–0.70)

## 2016-04-16 MED ORDER — DIGOXIN 250 MCG PO TABS
0.3750 mg | ORAL_TABLET | Freq: Every day | ORAL | Status: DC
  Administered 2016-04-17: 0.375 mg via ORAL
  Filled 2016-04-16: qty 1

## 2016-04-16 MED ORDER — DIGOXIN 125 MCG PO TABS
0.1250 mg | ORAL_TABLET | Freq: Once | ORAL | Status: AC
Start: 2016-04-16 — End: 2016-04-16
  Administered 2016-04-16: 0.125 mg via ORAL
  Filled 2016-04-16: qty 1

## 2016-04-16 MED ORDER — METOPROLOL TARTRATE 25 MG PO TABS
25.0000 mg | ORAL_TABLET | Freq: Four times a day (QID) | ORAL | Status: DC
  Administered 2016-04-16 – 2016-04-18 (×7): 25 mg via ORAL
  Filled 2016-04-16 (×7): qty 1

## 2016-04-16 MED ORDER — FUROSEMIDE 40 MG PO TABS: 40.0000 mg | ORAL_TABLET | Freq: Every day | ORAL | Status: DC

## 2016-04-16 NOTE — Progress Notes (Signed)
[place] [facility] [place] NAME: Brandy [name]    MR#:  [id number]  DATE OF BIRTH:  12/27/1955  SUBJECTIVE:  CHIEF COMPLAINT:   Chief Complaint  Patient presents with  . Shortness of Breath   - Scheduled for cardiac catheterization tomorrow. -Heart rate still elevated on minimal movement. Remains in A. Fib. -Currently asymptomatic  REVIEW OF SYSTEMS:  Review of Systems  Constitutional: Negative for chills, fever and malaise/fatigue.  HENT: Negative for congestion, ear discharge, hearing loss, nosebleeds and sore throat.   Eyes: Negative for blurred vision and double vision.  Respiratory: Negative for cough, shortness of breath and wheezing.   Cardiovascular: Negative for chest pain, palpitations and leg swelling.  Gastrointestinal: Negative for abdominal pain, constipation, diarrhea, nausea and vomiting.  Genitourinary: Negative for dysuria and urgency.  Musculoskeletal: Negative for myalgias.  Neurological: Negative for dizziness, sensory change, speech change, focal weakness, seizures and headaches.  Psychiatric/Behavioral: Negative for depression.    DRUG ALLERGIES:  No Known Allergies  VITALS:  Blood pressure (!) 117/55, pulse 88, temperature 97.5 F (36.4 C), temperature source Oral, resp. rate 18, height 5\' 7"  (1.702 m), weight 112 kg (246 lb 14.6 oz), SpO2 93 %.  PHYSICAL EXAMINATION:  Physical Exam  GENERAL:  60 y.o.-year-old patient lying in the bed with no acute distress.  EYES: Pupils equal, round, reactive to light and accommodation. No scleral icterus. Extraocular muscles intact.  HEENT: Head atraumatic, normocephalic. Oropharynx and nasopharynx clear.  NECK:  Supple, no jugular venous distention. No thyroid enlargement, no tenderness.  LUNGS: Normal breath sounds bilaterally, no wheezing, rales,rhonchi or crepitation. No use of accessory muscles of respiration. Decreased bibasilar breath sounds CARDIOVASCULAR: S1, S2  Irregular. No murmurs, rubs, or gallops.  ABDOMEN: Soft, nontender, nondistended. Bowel sounds present. No organomegaly or mass.  EXTREMITIES: No cyanosis, or clubbing. Varicose veins noted on legs. NEUROLOGIC: Cranial nerves II through XII are intact. Muscle strength 5/5 in all extremities. Sensation intact. Gait not checked.  PSYCHIATRIC: The patient is alert and oriented x 3.  SKIN: No obvious rash, lesion, or ulcer.    LABORATORY PANEL:   CBC  Recent Labs Lab 04/16/16 0435  WBC 11.2*  HGB 17.0*  HCT 49.6*  PLT 333   ------------------------------------------------------------------------------------------------------------------  Chemistries   Recent Labs Lab 04/13/16 1033  04/16/16 0435  NA  --   < > 138  K  --   < > 3.8  CL  --   < > 106  CO2  --   < > 24  GLUCOSE  --   < > 107*  BUN  --   < > 18  CREATININE  --   < > 0.95  CALCIUM  --   < > 9.4  MG 2.1  --   --   < > = values in this interval not displayed. ------------------------------------------------------------------------------------------------------------------  Cardiac Enzymes  Recent Labs Lab 04/13/16 1033  TROPONINI <0.03   ------------------------------------------------------------------------------------------------------------------  RADIOLOGY:  No results found.  EKG:   Orders placed or performed during the hospital encounter of 04/12/16  . EKG 12-Lead  . EKG 12-Lead    ASSESSMENT AND PLAN:   60y/o F with PMH of Obesity and no other medical problems presents to hospital secondary to worsening shortness of breath and noted to have new onset A. fib with RVR.  #1 new onset atrial fibrillation with rapid ventricular response-started after a viral bronchitis. -Rate is still elevated. Currently on metoprolol low-dose 12.5 mg  every 6 hours.  -dose can be adjusted if blood pressure stays up -Also on IV heparin for anticoagulation. We'll change to oral agent after cardiac  catheterization on Monday. -If rate cannot be controlled, after adequate anticoagulation, cardioversion can be planned -Appreciate cardiology consult. -Also on digoxin.  #2 acute CHF-systolic dysfunction, echo with wall motion abnormalities and EF is 15%. -Was on esmolol drip in the ICU. Will need a right and left heart catheterization, scheduled for Monday to rule out ischemic causes. -Since blood pressure is low, currently only on metoprolol. Medications can be adjusted once blood pressure is improved. -Continue diuresis with Lasix twice a day  #3 acute respiratory distress on admission-secondary to acute pulmonary edema and CHF exacerbation. -Management as mentioned above. On Lasix. Much improved breathing. Currently on room air.  #4 DVT prophylaxis-on heparin drip  Appreciate cardiology consult    All the records are reviewed and case discussed with Care Management/Social Workerr. Management plans discussed with the patient, family and they are in agreement.  CODE STATUS: Full Code  TOTAL TIME TAKING CARE OF THIS PATIENT: 38 minutes.   POSSIBLE D/C IN 1-2 DAYS, DEPENDING ON CLINICAL CONDITION.   [name] M.D on 04/16/2016 at 10:56 AM  Between 7am to 6pm - Pager - [phone]  After 6pm go to [email or web address] - password [email or web address] [facility]  Office  [phone]  CC: Primary care physician; Elgie Collard, MD

## 2016-04-16 NOTE — Progress Notes (Signed)
ANTICOAGULATION CONSULT NOTE - Follow Up Consult  Pharmacy Consult for Heparin Drip Indication: atrial fibrillation  No Known Allergies  Patient Measurements: Height: 5\' 7"  (170.2 cm) Weight: 246 lb 14.6 oz (112 kg) IBW/kg (Calculated) : 61.6 Heparin Dosing Weight: 87.5 kg  Vital Signs: Temp: 97.5 F (36.4 C) (12/03 0428) Temp Source: Oral (12/03 0428) BP: 104/49 (12/03 0428) Pulse Rate: 87 (12/03 0428)  Labs:  Recent Labs  04/13/16 1033  04/14/16 0317 04/14/16 0851 04/15/16 0431 04/16/16 0435  HGB  --   < > 14.3  --  15.0 17.0*  HCT  --   --  41.7  --  43.9 49.6*  PLT  --   --  259  --  283 333  APTT 31  --   --   --   --   --   LABPROT 14.8  --   --   --   --   --   INR 1.15  --   --   --   --   --   HEPARINUNFRC  --   < > 0.55 0.51 0.44 0.54  CREATININE  --   --   --  0.95 0.89 0.95  TROPONINI <0.03  --   --   --   --   --   < > = values in this interval not displayed.  Estimated Creatinine Clearance: 80.3 mL/min (by C-G formula based on SCr of 0.95 mg/dL).   Medications:  Infusions:  . heparin 1,600 Units/hr (04/16/16 0329)    Assessment: Pharmacy has been consulted to dose heparin bolus and drip in this 44 yoF with ?new onset afib with RVR, uncertain when pt went into Afib as she was having increased SOB throughout the previous week. Rate has been difficult to control. Per cardio note, heparin has been initiated in case pt requires DCCV prior to discharge and due to low CHADS2VASc (1), pt should not require long term anticoagulation. According to PTA medication list, patient was not taking OC prior to admission. Baseline labs WNL.  12/1 08:51`HL resulted at 0.51  Goal of Therapy:  Heparin level 0.3-0.7 units/ml Monitor platelets by anticoagulation protocol: Yes   Plan:  Will continue with current rate of 1600 units/hr. This is second therapeutic HL, will move to daily monitoring of HL and CBC.  12/2 AM heparin level 0.44. Continue current regimen.  Recheck heparin level and CBC tomorrow AM.  12/3 AM heparin level 0.54. Continue current regimen. Recheck heparin level and CBC tomorrow AM.  Paulina Fusi, PharmD, BCPS 04/16/2016 5:23 AM

## 2016-04-16 NOTE — Progress Notes (Signed)
Patient Name: Brandy [name] Date of Encounter: 04/16/2016  Primary Cardiologist: new (Dr. Fletcher Anon)  [facility] Problem List     Principal Problem:   New onset atrial fibrillation [facility]) Active Problems:   Acute respiratory distress   Pulmonary edema   Bronchitis   Acute systolic CHF (congestive heart failure) (HCC)   Congestive dilated cardiomyopathy (HCC)   Morbid obesity ([place])    Subjective   Feeling well.  Heart rates increasing with minimal movement. She feels uneasy when her heart rate is elevated. Denies chest pain or shortness of breath.  Inpatient Medications    Scheduled Meds: . digoxin  0.25 mg Oral Daily  . furosemide  20 mg Intravenous BID  . metoprolol tartrate  12.5 mg Oral Q6H  . potassium chloride  10 mEq Oral BID  . sodium chloride flush  3 mL Intravenous Q12H   Continuous Infusions: . heparin 1,600 Units/hr (04/16/16 0329)   PRN Meds: acetaminophen **OR** acetaminophen, guaiFENesin-dextromethorphan, hydrocortisone cream, ipratropium-albuterol, LORazepam, ondansetron **OR** ondansetron (ZOFRAN) IV, oxyCODONE   Vital Signs    Vitals:   04/16/16 0428 04/16/16 0925 04/16/16 1034 04/16/16 1203  BP: (!) 104/49 (!) 104/58 (!) 117/55 112/80  Pulse: 87 97 88 (!) 45  Resp: 18   20  Temp: 97.5 F (36.4 C)   97.7 F (36.5 C)  TempSrc: Oral   Oral  SpO2: 93%   90%  Weight:      Height:        Intake/Output Summary (Last 24 hours) at 04/16/16 1229 Last data filed at 04/16/16 1222  Gross per 24 hour  Intake           498.93 ml  Output             1750 ml  Net         -1251.07 ml   Filed Weights   04/12/16 1815 04/12/16 2200  Weight: 112.5 kg (248 lb) 112 kg (246 lb 14.6 oz)    Physical Exam   GEN: Well nourished, well developed, in no acute distress.  HEENT: Grossly normal.  Neck: Supple, no JVD, carotid bruits, or masses. Cardiac: Irregularly irregular, no murmurs, rubs, or gallops. No clubbing, cyanosis, edema.  Radials/DP/PT 2+ and equal  bilaterally.  Respiratory:  Respirations regular and unlabored, clear to auscultation bilaterally. GI: Soft, nontender, nondistended, BS + x 4. MS: no deformity or atrophy. Skin: warm and dry, no rash. Neuro:  Strength and sensation are intact. Psych: AAOx3.  Normal affect.  Labs    CBC  Recent Labs  04/15/16 0431 04/16/16 0435  WBC 10.1 11.2*  HGB 15.0 17.0*  HCT 43.9 49.6*  MCV 88.3 87.9  PLT 283 0000000   Basic Metabolic Panel  Recent Labs  04/15/16 0431 04/16/16 0435  NA 139 138  K 3.6 3.8  CL 107 106  CO2 24 24  GLUCOSE 103* 107*  BUN 16 18  CREATININE 0.89 0.95  CALCIUM 8.8* 9.4   Liver Function Tests No results for input(s): AST, ALT, ALKPHOS, BILITOT, PROT, ALBUMIN in the last 72 hours. No results for input(s): LIPASE, AMYLASE in the last 72 hours. Cardiac Enzymes No results for input(s): CKTOTAL, CKMB, CKMBINDEX, TROPONINI in the last 72 hours. BNP Invalid input(s): POCBNP D-Dimer No results for input(s): DDIMER in the last 72 hours. Hemoglobin A1C No results for input(s): HGBA1C in the last 72 hours. Fasting Lipid Panel No results for input(s): CHOL, HDL, LDLCALC, TRIG, CHOLHDL, LDLDIRECT in the last 72 hours. Thyroid  Function Tests No results for input(s): TSH, T4TOTAL, T3FREE, THYROIDAB in the last 72 hours.  Invalid input(s): FREET3  Telemetry    Atrial fibrillation.  Rates 90s-120s.  Today in the 90s.- Personally Reviewed  ECG    n/a  Radiology    No results found.  Cardiac Studies   Echo 04/13/16: Study Conclusions  - Left ventricle: The cavity size was moderately dilated. Wall   thickness was normal. Systolic function was severely reduced. The   estimated ejection fraction was in the range of 15% to 20%.   Akinesis of the anterior myocardium. Diffuse hypokinesis of other   walls. - Mitral valve: There was moderate regurgitation. - Left atrium: The atrium was moderately dilated. - Right atrium: The atrium was mildly  dilated. - Pericardium, extracardiac: There was a left pleural effusion.  Patient Profile     Brandy [name] is a 65F with no known past medical history who presented with shortness of breath in the setting of URI symptoms and newly diagnosed with atrial fibrillation with RVR and acute systolic and diastolic heart failure.  Assessment & Plan    # Newly diagnosed atrial fibrillation:  Brandy [name]'s heart rates are not as well-controlled today. Her heart rate is around 100 at rest and increases up to the 140s with exertion. It was better controlled on IV diltiazem. I suspect that her oral dose is inadequate.  We will give an additional dose of 0.125mg  now and increase to 0.375mg  starting tomorrow.  Digoxin level is pending for the morning. This will need to be repeated in a few days if she continues on this higher dose. We will also increase her metoprolol to 25 mg every 6 hours given that her blood pressure is now better. If her heart rates remained poorly controlled she will need TEE and cardioversion this admission. Continue heparin for anticoagulation.  She will need to start an oral agent after her heart cath on Monday.     This patients CHA2DS2-VASc Score and unadjusted Ischemic Stroke Rate (% per year) is equal to 2.2 % stroke rate/year from a score of 2  Above score calculated as 1 point each if present [CHF, HTN, DM, Vascular=MI/PAD/Aortic Plaque, Age if 65-74, or Female] Above score calculated as 2 points each if present [Age > 75, or Stroke/TIA/TE]  # Acute systolic and diastolic heart failure:  Brandy [name] was found to have LVEF 15-20% this admission.  It is unclear if this is due to viral cardiomyopathy, tachycardia-induced cardiomyopathy, or ischemia.  Plan for [facility] tomorrow.  She appears to be euvolemic and her creatinine is starting to rise slightly. We will switch her Lasix from IV to 40 mg oral daily.  She is not on an ACE-I/ARB 2/2 hypotension and the need to titrate her beta blocker.  She  will need a repeat echo in 3 months to assess for improvement in LVEF.   Signed, Skeet Latch, MD  04/16/2016, 12:29 PM

## 2016-04-16 NOTE — Plan of Care (Signed)
Problem: Activity: Goal: Ability to tolerate increased activity will improve Outcome: Progressing Heart rate continues to fluctuate with activity, improved with resting periods.

## 2016-04-17 ENCOUNTER — Encounter: Payer: Self-pay | Admitting: Cardiovascular Disease

## 2016-04-17 ENCOUNTER — Encounter
Admission: EM | Disposition: A | Discharge status: Home / Self Care | Payer: Self-pay | Source: Home / Self Care | Attending: Internal Medicine

## 2016-04-17 HISTORY — PX: CARDIAC CATHETERIZATION: SHX172

## 2016-04-17 LAB — CBC
HCT: 49.7 % — ABNORMAL HIGH (ref 35.0–47.0)
Hemoglobin: 16.6 g/dL — ABNORMAL HIGH (ref 12.0–16.0)
MCH: 29.7 pg (ref 26.0–34.0)
MCHC: 33.5 g/dL (ref 32.0–36.0)
MCV: 88.8 fL (ref 80.0–100.0)
Platelets: 305 10*3/uL (ref 150–440)
RBC: 5.6 MIL/uL — ABNORMAL HIGH (ref 3.80–5.20)
RDW: 13.6 % (ref 11.5–14.5)
WBC: 9.8 10*3/uL (ref 3.6–11.0)

## 2016-04-17 LAB — BASIC METABOLIC PANEL
Anion gap: 6 (ref 5–15)
BUN: 14 mg/dL (ref 6–20)
CO2: 23 mmol/L (ref 22–32)
Calcium: 9.1 mg/dL (ref 8.9–10.3)
Chloride: 107 mmol/L (ref 101–111)
Creatinine, Ser: 0.83 mg/dL (ref 0.44–1.00)
GFR calc Af Amer: >60 mL/min (ref >60)
GFR calc non Af Amer: >60 mL/min (ref >60)
Glucose, Bld: 95 mg/dL (ref 65–99)
Potassium: 4.2 mmol/L (ref 3.5–5.1)
Sodium: 136 mmol/L (ref 135–145)

## 2016-04-17 LAB — HEPARIN LEVEL (UNFRACTIONATED): Heparin Unfractionated: 0.59 [IU]/mL (ref 0.30–0.70)

## 2016-04-17 LAB — DIGOXIN LEVEL: Digoxin Level: 0.7 ng/mL — ABNORMAL LOW (ref 0.8–2.0)

## 2016-04-17 LAB — PROTIME-INR
INR: 1.08
Prothrombin Time: 14 s (ref 11.4–15.2)

## 2016-04-17 SURGERY — LEFT HEART CATH AND CORONARY ANGIOGRAPHY
Anesthesia: Moderate Sedation

## 2016-04-17 MED ORDER — DIGOXIN 250 MCG PO TABS
0.2500 mg | ORAL_TABLET | Freq: Every day | ORAL | Status: DC
  Administered 2016-04-18: 0.25 mg via ORAL
  Filled 2016-04-17 (×2): qty 1

## 2016-04-17 MED ORDER — SODIUM CHLORIDE 0.9% FLUSH: 3.0000 mL | INTRAVENOUS | Status: DC | PRN

## 2016-04-17 MED ORDER — SODIUM CHLORIDE 0.9% FLUSH: 3.0000 mL | Freq: Two times a day (BID) | INTRAVENOUS | Status: DC

## 2016-04-17 MED ORDER — SODIUM CHLORIDE 0.9% FLUSH
3.0000 mL | Freq: Two times a day (BID) | INTRAVENOUS | Status: DC
  Administered 2016-04-17 (×2): 3 mL via INTRAVENOUS

## 2016-04-17 MED ORDER — SODIUM CHLORIDE 0.9 % IV SOLN: INTRAVENOUS | Status: DC

## 2016-04-17 MED ORDER — FENTANYL CITRATE (PF) 100 MCG/2ML IJ SOLN
INTRAMUSCULAR | Status: AC
  Filled 2016-04-17: qty 2

## 2016-04-17 MED ORDER — MIDAZOLAM HCL 2 MG/2ML IJ SOLN
INTRAMUSCULAR | Status: DC | PRN
  Administered 2016-04-17 (×3): 1 mg via INTRAVENOUS

## 2016-04-17 MED ORDER — VERAPAMIL HCL 2.5 MG/ML IV SOLN
INTRAVENOUS | Status: AC
  Filled 2016-04-17: qty 2

## 2016-04-17 MED ORDER — HEPARIN (PORCINE) IN NACL 2-0.9 UNIT/ML-% IJ SOLN
INTRAMUSCULAR | Status: AC
  Filled 2016-04-17: qty 500

## 2016-04-17 MED ORDER — APIXABAN 5 MG PO TABS
5.0000 mg | ORAL_TABLET | Freq: Two times a day (BID) | ORAL | Status: DC
  Administered 2016-04-17 – 2016-04-18 (×2): 5 mg via ORAL
  Filled 2016-04-17 (×2): qty 1

## 2016-04-17 MED ORDER — SODIUM CHLORIDE 0.9 % IV SOLN
INTRAVENOUS | Status: AC
  Administered 2016-04-17: 13:00:00 via INTRAVENOUS

## 2016-04-17 MED ORDER — ASPIRIN 81 MG PO CHEW
81.0000 mg | CHEWABLE_TABLET | ORAL | Status: DC
Start: 2016-04-18 — End: 2016-04-17

## 2016-04-17 MED ORDER — FENTANYL CITRATE (PF) 100 MCG/2ML IJ SOLN
INTRAMUSCULAR | Status: DC | PRN
  Administered 2016-04-17 (×2): 25 ug via INTRAVENOUS

## 2016-04-17 MED ORDER — SODIUM CHLORIDE 0.9 % IV SOLN: 250.0000 mL | INTRAVENOUS | Status: DC | PRN

## 2016-04-17 MED ORDER — HEPARIN SODIUM (PORCINE) 1000 UNIT/ML IJ SOLN
INTRAMUSCULAR | Status: AC
  Filled 2016-04-17: qty 1

## 2016-04-17 MED ORDER — HEPARIN SODIUM (PORCINE) 1000 UNIT/ML IJ SOLN
INTRAMUSCULAR | Status: DC | PRN
  Administered 2016-04-17: 3000 [IU] via INTRAVENOUS

## 2016-04-17 MED ORDER — MIDAZOLAM HCL 2 MG/2ML IJ SOLN
INTRAMUSCULAR | Status: AC
  Filled 2016-04-17: qty 2

## 2016-04-17 MED ORDER — IOPAMIDOL (ISOVUE-300) INJECTION 61%
INTRAVENOUS | Status: DC | PRN
  Administered 2016-04-17: 40 mL via INTRA_ARTERIAL

## 2016-04-17 MED ORDER — FUROSEMIDE 20 MG PO TABS
20.0000 mg | ORAL_TABLET | Freq: Every day | ORAL | Status: DC
  Administered 2016-04-18: 20 mg via ORAL
  Filled 2016-04-17 (×2): qty 1

## 2016-04-17 MED ORDER — POTASSIUM CHLORIDE CRYS ER 20 MEQ PO TBCR
10.0000 meq | EXTENDED_RELEASE_TABLET | Freq: Every day | ORAL | Status: DC
  Administered 2016-04-18: 10 meq via ORAL
  Filled 2016-04-17: qty 1

## 2016-04-17 SURGICAL SUPPLY — 10 items
CATH OPTITORQUE JACKY 4.0 5F (CATHETERS) ×2 IMPLANT
CATH SWANZ 7F THERMO (CATHETERS) IMPLANT
DEVICE RAD TR BAND REGULAR (VASCULAR PRODUCTS) ×2 IMPLANT
GLIDESHEATH SLEND SS 6F .021 (SHEATH) ×2 IMPLANT
KIT MANI 3VAL PERCEP (MISCELLANEOUS) IMPLANT
KIT RIGHT HEART (MISCELLANEOUS) IMPLANT
NEEDLE PERC 18GX7CM (NEEDLE) ×2 IMPLANT
PACK CARDIAC CATH (CUSTOM PROCEDURE TRAY) ×2 IMPLANT
SHEATH PINNACLE 7F 10CM (SHEATH) IMPLANT
WIRE ROSEN-J .035X260CM (WIRE) ×2 IMPLANT

## 2016-04-17 NOTE — Progress Notes (Signed)
ANTICOAGULATION CONSULT NOTE - Follow Up Consult  Pharmacy Consult for Heparin Drip Indication: atrial fibrillation  No Known Allergies  Patient Measurements: Height: 5\' 7"  (170.2 cm) Weight: 246 lb 14.6 oz (112 kg) IBW/kg (Calculated) : 61.6 Heparin Dosing Weight: 87.5 kg  Vital Signs: Temp: 98.2 F (36.8 C) (12/04 0418) Temp Source: Oral (12/04 0418) BP: 108/58 (12/04 0418) Pulse Rate: 86 (12/04 0418)  Labs:  Recent Labs  04/14/16 0851  04/15/16 0431 04/16/16 0435 04/17/16 0420  HGB  --   < > 15.0 17.0* 16.6*  HCT  --   --  43.9 49.6* 49.7*  PLT  --   --  283 333 305  HEPARINUNFRC 0.51  --  0.44 0.54 0.59  CREATININE 0.95  --  0.89 0.95  --   < > = values in this interval not displayed.  Estimated Creatinine Clearance: 80.3 mL/min (by C-G formula based on SCr of 0.95 mg/dL).   Medications:  Infusions:  . [START ON 04/18/2016] sodium chloride    . heparin 1,600 Units/hr (04/16/16 2110)    Assessment: Pharmacy has been consulted to dose heparin bolus and drip in this 29 yoF with ?new onset afib with RVR, uncertain when pt went into Afib as she was having increased SOB throughout the previous week. Rate has been difficult to control. Per cardio note, heparin has been initiated in case pt requires DCCV prior to discharge and due to low CHADS2VASc (1), pt should not require long term anticoagulation. According to PTA medication list, patient was not taking OC prior to admission. Baseline labs WNL.  12/1 08:51`HL resulted at 0.51  Goal of Therapy:  Heparin level 0.3-0.7 units/ml Monitor platelets by anticoagulation protocol: Yes   Plan:  Will continue with current rate of 1600 units/hr. This is second therapeutic HL, will move to daily monitoring of HL and CBC.  12/2 AM heparin level 0.44. Continue current regimen. Recheck heparin level and CBC tomorrow AM.  12/3 AM heparin level 0.54. Continue current regimen. Recheck heparin level and CBC tomorrow AM.  12/4 AM  heparin level 0.59. Continue current regimen. Recheck heparin level and CBC tomorrow AM.  Paulina Fusi, PharmD, BCPS 04/17/2016 6:35 AM

## 2016-04-17 NOTE — Progress Notes (Signed)
Attempted multiple times with another nurse to start second IV for cardiac cath with no success. Will pass along to day shift

## 2016-04-17 NOTE — Care Management (Signed)
Admitted with new onset atrial fib with rvr and  acute diastolic and systolic congestive heart failure.  Required cardizem continuous infusion. Echo showed EF 15-20%  Cath this morning. Received verbal report that coronary arteries were normal.  Anticipate patient will have cardioversion 11.5

## 2016-04-17 NOTE — Progress Notes (Signed)
Pt clinically stable post heart cath, tr band off no bleeding at this time, vitals stable, afib per monitor,

## 2016-04-17 NOTE — Progress Notes (Signed)
Pt clinically stable post heart cath,per DR Fletcher Anon, denies complaints,vitals remain stable, tr band remains on right,no bleeding nor hematoma visible at this time, report called to Georga Hacking on telemetry with plan reviewed, afib per monitor,

## 2016-04-17 NOTE — Progress Notes (Signed)
    Patient with heart rate in the 80's bpm currently while sleeping. With ambulation she still becomes tachycardic with heart rates in the 130's bpm. Small amount of blood noted with urination this afternoon. Continue to monitor this. Have patient ambulate this evening to assess heart rate. If still uncontrolled will need to discuss with MD and anesthesia regarding possible TEE/DCCV prior to discharge.

## 2016-04-17 NOTE — Plan of Care (Addendum)
Patient has vaginal bring red blood (about 1 teaspoon) when urinating?  Dr notified.Dr. Annie Main call - since patient has been on Heparin drip and it's now [facility], RN to continue to assess bleeding amounts. Patient also  informed of need to see and record.

## 2016-04-17 NOTE — Progress Notes (Signed)
[facility] [place] NAME: Brandy [name]    MR#:  [id number]  DATE OF BIRTH:  1955/12/05  SUBJECTIVE:  CHIEF COMPLAINT:   Chief Complaint  Patient presents with  . Shortness of Breath   - Scheduled for cardiac catheterization this morning. -Heart rate still elevated with minimal exertion.   REVIEW OF SYSTEMS:  Review of Systems  Constitutional: Negative for chills, fever and malaise/fatigue.  HENT: Negative for congestion, ear discharge, hearing loss, nosebleeds and sore throat.   Eyes: Negative for blurred vision and double vision.  Respiratory: Negative for cough, shortness of breath and wheezing.   Cardiovascular: Negative for chest pain, palpitations and leg swelling.  Gastrointestinal: Negative for abdominal pain, constipation, diarrhea, nausea and vomiting.  Genitourinary: Negative for dysuria and urgency.  Musculoskeletal: Negative for myalgias.  Neurological: Negative for dizziness, sensory change, speech change, focal weakness, seizures and headaches.  Psychiatric/Behavioral: Negative for depression.    DRUG ALLERGIES:  No Known Allergies  VITALS:  Blood pressure (!) 108/58, pulse 86, temperature 98.2 F (36.8 C), temperature source Oral, resp. rate 18, height 5\' 7"  (1.702 m), weight 112 kg (246 lb 14.6 oz), SpO2 98 %.  PHYSICAL EXAMINATION:  Physical Exam  GENERAL:  60 y.o.-year-old patient lying in the bed with no acute distress.  EYES: Pupils equal, round, reactive to light and accommodation. No scleral icterus. Extraocular muscles intact.  HEENT: Head atraumatic, normocephalic. Oropharynx and nasopharynx clear.  NECK:  Supple, no jugular venous distention. No thyroid enlargement, no tenderness.  LUNGS: Normal breath sounds bilaterally, no wheezing, rales,rhonchi or crepitation. No use of accessory muscles of respiration. Decreased bibasilar breath sounds CARDIOVASCULAR: S1, S2 Irregular. No murmurs, rubs, or gallops.    ABDOMEN: Soft, nontender, nondistended. Bowel sounds present. No organomegaly or mass.  EXTREMITIES: No cyanosis, or clubbing. Varicose veins noted on legs. NEUROLOGIC: Cranial nerves II through XII are intact. Muscle strength 5/5 in all extremities. Sensation intact. Gait not checked.  PSYCHIATRIC: The patient is alert and oriented x 3.  SKIN: No obvious rash, lesion, or ulcer.    LABORATORY PANEL:   CBC  Recent Labs Lab 04/17/16 0420  WBC 9.8  HGB 16.6*  HCT 49.7*  PLT 305   ------------------------------------------------------------------------------------------------------------------  Chemistries   Recent Labs Lab 04/13/16 1033  04/16/16 0435  NA  --   < > 138  K  --   < > 3.8  CL  --   < > 106  CO2  --   < > 24  GLUCOSE  --   < > 107*  BUN  --   < > 18  CREATININE  --   < > 0.95  CALCIUM  --   < > 9.4  MG 2.1  --   --   < > = values in this interval not displayed. ------------------------------------------------------------------------------------------------------------------  Cardiac Enzymes  Recent Labs Lab 04/13/16 1033  TROPONINI <0.03   ------------------------------------------------------------------------------------------------------------------  RADIOLOGY:  No results found.  EKG:   Orders placed or performed during the hospital encounter of 04/12/16  . EKG 12-Lead  . EKG 12-Lead    ASSESSMENT AND PLAN:   60y/o F with PMH of Obesity and no other medical problems presents to hospital secondary to worsening shortness of breath and noted to have new onset A. fib with RVR.  #1 new onset atrial fibrillation with rapid ventricular response-started after a viral bronchitis. -Rate is still elevated. Currently on metoprolol low-dose -dose can be adjusted if  blood pressure stays up -Also on IV heparin for anticoagulation. We'll change to oral agent after cardiac catheterization today -If rate cannot be controlled, after adequate  anticoagulation, cardioversion can be planned -Appreciate cardiology consult. -Also on digoxin.   #2 acute CHF-systolic dysfunction, echo with wall motion abnormalities and EF is 15%. -Was on esmolol drip in the ICU. Will need a right and left heart catheterization, scheduled for today, to rule out ischemic causes. -Since blood pressure is low, currently only on metoprolol. Medications can be adjusted once blood pressure is improved. -Continue diuresis with Lasix  #3 acute respiratory distress on admission-secondary to acute pulmonary edema and CHF exacerbation. -Management as mentioned above. On Lasix. Much improved breathing. Currently on room air.  #4 DVT prophylaxis-on heparin drip  Appreciate cardiology consult.    All the records are reviewed and case discussed with Care Management/Social Workerr. Management plans discussed with the patient, family and they are in agreement.  CODE STATUS: Full Code  TOTAL TIME TAKING CARE OF THIS PATIENT: 38 minutes.   POSSIBLE D/C IN 1-2 DAYS, DEPENDING ON CLINICAL CONDITION.   [name] M.D on 04/17/2016 at 8:12 AM  Between 7am to 6pm - Pager - [phone]  After 6pm go to [email or web address] - password [email or web address]  Office  [phone]  CC: Primary care physician; Elgie Collard, MD

## 2016-04-17 NOTE — Plan of Care (Addendum)
Patient going to specials for Cardiac cath about 0930.  Patient given Digoxin and metoprolol with sip of water.  Patient shaved and CHGx 2 has been completed. All jewelery removed and consents signed. Has been NPO since midnight. Report given to Karin Golden, RN and Hand off completed.  RN assessment and VS revealed stability for transport.

## 2016-04-17 NOTE — Progress Notes (Signed)
Patient Name: Brandy [name] Date of Encounter: 04/17/2016  Primary Cardiologist: new (Dr. Fletcher [name])  Brandy [name] Problem List     Principal Problem:   New onset atrial fibrillation Brandy [name]) Active Problems:   Acute respiratory distress   Pulmonary edema   Bronchitis   Acute systolic CHF (congestive heart failure) (HCC)   Congestive dilated cardiomyopathy (Brandy [name])   Morbid obesity (Brandy [name])    Subjective   He is better overall with less shortness of breath and cough. She slept well overnight. She continues [name] be in atrial fibrillation. Ventricular rate is controlled at rest but not with activities.  Inpatient Medications    Scheduled Meds: . [START ON 04/18/2016] aspirin  81 mg Oral Pre-Cath  . digoxin  0.375 mg Oral Daily  . furosemide  40 mg Oral Daily  . metoprolol tartrate  25 mg Oral Q6H  . potassium chloride  10 mEq Oral BID  . sodium chloride flush  3 mL Intravenous Q12H  . sodium chloride flush  3 mL Intravenous Q12H   Continuous Infusions: . [START ON 04/18/2016] sodium chloride    . heparin 1,600 Units/hr (04/16/16 2110)   PRN Meds: sodium chloride, acetaminophen **OR** acetaminophen, guaiFENesin-dextromethorphan, hydrocortisone cream, ipratropium-albuterol, LORazepam, ondansetron **OR** ondansetron (ZOFRAN) IV, oxyCODONE, sodium chloride flush   Vital Signs    Vitals:   04/16/16 1203 04/16/16 1610 04/16/16 1953 04/17/16 0418  BP: 112/80 (!) 112/56 96/63 (!) 108/58  Pulse: (!) 45 99 85 86  Resp: 20  18 18   Temp: 97.7 F (36.5 C)  97.8 F (36.6 C) 98.2 F (36.8 C)  TempSrc: Oral  Oral Oral  SpO2: 90%  96% 98%  Weight:      Height:        Intake/Output Summary (Last 24 hours) at 04/17/16 0816 Last data filed at 04/17/16 0600  Gross per 24 hour  Intake           735.47 ml  Output             2900 ml  Net         -2164.53 ml   Filed Weights   04/12/16 1815 04/12/16 2200  Weight: 248 lb (112.5 kg) 246 lb 14.6 oz (112 kg)    Physical Exam   GEN: Well  nourished, well developed, in no acute distress.  HEENT: Grossly normal.  Neck: Supple, no JVD, carotid bruits, or masses. Cardiac: Irregularly irregular, no murmurs, rubs, or gallops. No clubbing, cyanosis, edema.  Radials/DP/PT 2+ and equal bilaterally.  Respiratory:  Respirations regular and unlabored, clear [name] auscultation bilaterally. GI: Soft, nontender, nondistended, BS + x 4. MS: no deformity or atrophy. Skin: warm and dry, no rash. Neuro:  Strength and sensation are intact. Psych: AAOx3.  Normal affect.  Labs    CBC  Recent Labs  04/16/16 0435 04/17/16 0420  WBC 11.2* 9.8  HGB 17.0* 16.6*  HCT 49.6* 49.7*  MCV 87.9 88.8  PLT 333 123456   Basic Metabolic Panel  Recent Labs  04/15/16 0431 04/16/16 0435  NA 139 138  K 3.6 3.8  CL 107 106  CO2 24 24  GLUCOSE 103* 107*  BUN 16 18  CREATININE 0.89 0.95  CALCIUM 8.8* 9.4   Liver Function Tests No results for input(s): AST, ALT, ALKPHOS, BILITOT, PROT, ALBUMIN in the last 72 hours. No results for input(s): LIPASE, AMYLASE in the last 72 hours. Cardiac Enzymes No results for input(s): CKTOTAL, CKMB, CKMBINDEX, TROPONINI in the last 72 hours. BNP Invalid  input(s): POCBNP D-Dimer No results for input(s): DDIMER in the last 72 hours. Hemoglobin A1C No results for input(s): HGBA1C in the last 72 hours. Fasting Lipid Panel No results for input(s): CHOL, HDL, LDLCALC, TRIG, CHOLHDL, LDLDIRECT in the last 72 hours. Thyroid Function Tests No results for input(s): TSH, T4TOTAL, T3FREE, THYROIDAB in the last 72 hours.  Invalid input(s): FREET3  Telemetry    Atrial fibrillation.  Rates 90s-120s.  Today in the 90s.- Personally Reviewed  ECG    n/a  Radiology    No results found.  Cardiac Studies   Echo 04/13/16: Study Conclusions  - Left ventricle: The cavity size was moderately dilated. Wall   thickness was normal. Systolic function was severely reduced. The   estimated ejection fraction was in the  range of 15% [name] 20%.   Akinesis of the anterior myocardium. Diffuse hypokinesis of other   walls. - Mitral valve: There was moderate regurgitation. - Left atrium: The atrium was moderately dilated. - Right atrium: The atrium was mildly dilated. - Pericardium, extracardiac: There was a left pleural effusion.  Patient Profile     Brandy [name] is a 64F with no known past medical history who presented with shortness of breath in the setting of URI symptoms and newly diagnosed with atrial fibrillation with RVR and acute systolic and diastolic heart failure.  Assessment & Plan    # Newly diagnosed atrial fibrillation:   Continue rate control and  Anticoagulation for now. She is currently on metoprolol and digoxin. Ventricular rate is not well controlled with activities. Most likely, she will TEE guided cardioversion tomorrow.  This patients CHA2DS2-VASc Score and unadjusted Ischemic Stroke Rate (% per year) is equal [name] 2.2 % stroke rate/year from a score of 2  Above score calculated as 1 point each if present [CHF, HTN, DM, Vascular=MI/PAD/Aortic Plaque, Age if 65-74, or Female] Above score calculated as 2 points each if present [Age > 75, or Stroke/TIA/TE]  # Acute systolic and diastolic heart failure:  Brandy [name] was found [name] have LVEF 15-20% this admission.  It is unclear if this is due [name] viral cardiomyopathy, tachycardia-induced cardiomyopathy, or ischemia.  Plan for [name] today.  She appears [name] be euvolemic. She is not on an ACE-I/ARB 2/2 hypotension and the need [name] titrate her beta blocker.  She will need a repeat echo in 3 months [name] assess for improvement in LVEF.   Signed, Brandy Sacramento, MD  04/17/2016, 8:16 AM

## 2016-04-18 DIAGNOSIS — I4891 Unspecified atrial fibrillation: Secondary | ICD-10-CM

## 2016-04-18 DIAGNOSIS — R0603 Acute respiratory distress: Secondary | ICD-10-CM

## 2016-04-18 DIAGNOSIS — R0602 Shortness of breath: Secondary | ICD-10-CM

## 2016-04-18 DIAGNOSIS — J40 Bronchitis, not specified as acute or chronic: Secondary | ICD-10-CM

## 2016-04-18 DIAGNOSIS — I428 Other cardiomyopathies: Secondary | ICD-10-CM

## 2016-04-18 LAB — BASIC METABOLIC PANEL
Anion gap: 6 (ref 5–15)
BUN: 15 mg/dL (ref 6–20)
CO2: 25 mmol/L (ref 22–32)
Calcium: 9.1 mg/dL (ref 8.9–10.3)
Chloride: 109 mmol/L (ref 101–111)
Creatinine, Ser: 0.82 mg/dL (ref 0.44–1.00)
GFR calc Af Amer: >60 mL/min (ref >60)
GFR calc non Af Amer: >60 mL/min (ref >60)
Glucose, Bld: 95 mg/dL (ref 65–99)
Potassium: 4.1 mmol/L (ref 3.5–5.1)
Sodium: 140 mmol/L (ref 135–145)

## 2016-04-18 LAB — CBC
HCT: 47.3 % — ABNORMAL HIGH (ref 35.0–47.0)
Hemoglobin: 16.1 g/dL — ABNORMAL HIGH (ref 12.0–16.0)
MCH: 30 pg (ref 26.0–34.0)
MCHC: 34.1 g/dL (ref 32.0–36.0)
MCV: 87.9 fL (ref 80.0–100.0)
Platelets: 293 10*3/uL (ref 150–440)
RBC: 5.38 MIL/uL — ABNORMAL HIGH (ref 3.80–5.20)
RDW: 13.6 % (ref 11.5–14.5)
WBC: 7 10*3/uL (ref 3.6–11.0)

## 2016-04-18 MED ORDER — FUROSEMIDE 20 MG PO TABS: 20.0000 mg | ORAL_TABLET | Freq: Every day | ORAL | 2 refills | Status: DC

## 2016-04-18 MED ORDER — APIXABAN 5 MG PO TABS: 5.0000 mg | ORAL_TABLET | Freq: Two times a day (BID) | ORAL | 2 refills | Status: DC

## 2016-04-18 MED ORDER — DIGOXIN 250 MCG PO TABS: 0.2500 mg | ORAL_TABLET | Freq: Every day | ORAL | 2 refills | Status: DC

## 2016-04-18 MED ORDER — METOPROLOL TARTRATE 50 MG PO TABS: 50.0000 mg | ORAL_TABLET | Freq: Two times a day (BID) | ORAL | Status: DC

## 2016-04-18 MED ORDER — METOPROLOL TARTRATE 50 MG PO TABS: 50.0000 mg | ORAL_TABLET | Freq: Two times a day (BID) | ORAL | 2 refills | Status: DC

## 2016-04-18 MED ORDER — METOPROLOL TARTRATE 25 MG PO TABS
25.0000 mg | ORAL_TABLET | Freq: Once | ORAL | Status: AC
Start: 2016-04-18 — End: 2016-04-18
  Administered 2016-04-18: 25 mg via ORAL
  Filled 2016-04-18: qty 1

## 2016-04-18 NOTE — Progress Notes (Signed)
     Brandy [name] was admitted to the [facility] on 04/12/2016 for an acute medical condition and is being Discharged on  04/18/2016 . She will need another 5 days for recovery and so advised to stay away from work until then. So please excuse her from work for the above mentioned  Days. Should be able to return to work without any restrictions from 04/24/16.  Call [name]  MD, [facility] at  [phone] with questions.  [name] M.D on 04/18/2016,at 9:15 AM  [facility] [address] [place] [address]

## 2016-04-18 NOTE — Progress Notes (Signed)
Patient Name: Brandy [name] Date of Encounter: 04/18/2016  Primary Cardiologist: new [facility])  Hospital Problem List     Principal Problem:   New onset atrial fibrillation [facility]) Active Problems:   Acute respiratory distress   Pulmonary edema   Bronchitis   Acute systolic CHF (congestive heart failure) (HCC)   Congestive dilated cardiomyopathy (HCC)   Morbid obesity (HCC)     Subjective   Overall, feels much better. Less SOB. Ambulated in the hallways in the evening of 12/4 with a reasonably well controlled heart rate into the low 100's to 1-teens. Asymptomatic. Ventricular rate better controlled. Labs stable. Status psot R/LHC on 12/4 that showed normal coronary arteries, severely dilated LV with normal LVEDP.  Inpatient Medications    Scheduled Meds: . apixaban  5 mg Oral BID  . digoxin  0.25 mg Oral Daily  . furosemide  20 mg Oral Daily  . metoprolol tartrate  25 mg Oral Q6H  . potassium chloride  10 mEq Oral Daily  . sodium chloride flush  3 mL Intravenous Q12H  . sodium chloride flush  3 mL Intravenous Q12H   Continuous Infusions:  PRN Meds: sodium chloride, acetaminophen **OR** acetaminophen, guaiFENesin-dextromethorphan, hydrocortisone cream, ipratropium-albuterol, LORazepam, ondansetron **OR** ondansetron (ZOFRAN) IV, oxyCODONE, sodium chloride flush   Vital Signs    Vitals:   04/17/16 1449 04/17/16 2003 04/18/16 0020 04/18/16 0453  BP: 110/72 98/65  (!) 119/48  Pulse: 84 84 74 85  Resp:  16  16  Temp:  98 F (36.7 C)  98.2 F (36.8 C)  TempSrc:  Oral  Oral  SpO2:  99%  97%  Weight:      Height:        Intake/Output Summary (Last 24 hours) at 04/18/16 0726 Last data filed at 04/18/16 0457  Gross per 24 hour  Intake              240 ml  Output             1950 ml  Net            -1710 ml   Filed Weights   04/12/16 1815 04/12/16 2200 04/17/16 0845  Weight: 248 lb (112.5 kg) 246 lb 14.6 oz (112 kg) 246 lb (111.6 kg)    Physical Exam    GEN:  Well nourished, well developed, in no acute distress.  HEENT: Grossly normal.  Neck: Supple, no JVD, carotid bruits, or masses. Cardiac: Irregularly irregular, no murmurs, rubs, or gallops. No clubbing, cyanosis, edema.  Radials/DP/PT 2+ and equal bilaterally.  Respiratory:  Respirations regular and unlabored, clear to auscultation bilaterally. GI: Soft, nontender, nondistended, BS + x 4. MS: no deformity or atrophy. Skin: warm and dry, no rash. Neuro:  Strength and sensation are intact. Psych: AAOx3.  Normal affect.  Labs    CBC  Recent Labs  04/17/16 0420 04/18/16 0340  WBC 9.8 7.0  HGB 16.6* 16.1*  HCT 49.7* 47.3*  MCV 88.8 87.9  PLT 305 0000000   Basic Metabolic Panel  Recent Labs  04/17/16 1228 04/18/16 0340  NA 136 140  K 4.2 4.1  CL 107 109  CO2 23 25  GLUCOSE 95 95  BUN 14 15  CREATININE 0.83 0.82  CALCIUM 9.1 9.1   Liver Function Tests No results for input(s): AST, ALT, ALKPHOS, BILITOT, PROT, ALBUMIN in the last 72 hours. No results for input(s): LIPASE, AMYLASE in the last 72 hours. Cardiac Enzymes No results for input(s): CKTOTAL, CKMB,  CKMBINDEX, TROPONINI in the last 72 hours. BNP Invalid input(s): POCBNP D-Dimer No results for input(s): DDIMER in the last 72 hours. Hemoglobin A1C No results for input(s): HGBA1C in the last 72 hours. Fasting Lipid Panel No results for input(s): CHOL, HDL, LDLCALC, TRIG, CHOLHDL, LDLDIRECT in the last 72 hours. Thyroid Function Tests No results for input(s): TSH, T4TOTAL, T3FREE, THYROIDAB in the last 72 hours.  Invalid input(s): FREET3  Telemetry    Afib, 80's bpm currently, short episodes into the 120's bpm, 14 beats of WCT - Personally Reviewed  ECG    n/a - Personally Reviewed  Radiology    No results found.  Cardiac Studies   LHC 04/17/16: Conclusion     LV end diastolic pressure is normal.   1. Normal coronary arteries. 2. Severely dilated left ventricle with Normal left ventricular  end-diastolic pressure. Left ventricular angiography was not performed. EF was severely dilated by echo.  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 Continue medical therapy. I am going to start anticoagulation with Eliquis this evening. If ventricular rate is controlled, the patient can be discharged home with rate control and anticoagulation with plans for outpatient cardioversion in 3 weeks. Otherwise, if the rate is not controlled, recommend TEE guided cardioversion tomorrow.   Echo 04/13/16: Study Conclusions  - Left ventricle: The cavity size was moderately dilated. Wall   thickness was normal. Systolic function was severely reduced. The   estimated ejection fraction was in the range of 15% to 20%.   Akinesis of the anterior myocardium. Diffuse hypokinesis of other   walls. - Mitral valve: There was moderate regurgitation. - Left atrium: The atrium was moderately dilated. - Right atrium: The atrium was mildly dilated. - Pericardium, extracardiac: There was a left pleural effusion.  Patient Profile     60 y.o. female with no known PMH who presented with shortness of breath in the setting of URI symptoms and newly diagnosed with atrial fibrillation with RVR and acute systolic and diastolic heart failure.  Assessment & Plan    1. Newly diagnosed Afib with RVR: -Ventricular rates are better controlled at rest and with ambulation this morning -She will ambulate in the hallway again this morning, if ventricular rate remains reasonably well controlled could possibly go home with planned DCCV s/p 3 weeks of uninterrupted anticoagulation  -Continue rate control with metoprolol and digoxin -Continue Eliquis (no further bleeding) -CHADS2VASc at least 2 (CHF, female)  2. Acute systolic and diastolic CHF: -She was found to have LVEF of 15-20% this admission -Unclear if this was viral cardiomyopathy vs tachycardia  induced -Ischemia was ruled out by cardiac cath on 12/4 -She appears euvolemic  -Not on an ACEi/ARB 2/2 hypotension and need for titration of her beta blocker -Needs repeat echo in 3 months to assess for LVEF improvement   Signed, Christell Faith, PA-C [place] Pager: [phone] 04/18/2016, 7:26 AM

## 2016-04-18 NOTE — Care Management (Signed)
Provide patient with Eliquis 30 day and copay assist coupon

## 2016-04-18 NOTE — Progress Notes (Signed)
Discharge instructions given to patient. Patient verbalized understanding. No further questions.  IV and tele removed. No acute distress at this time. Friend is at bedside and will be transporting patient home.

## 2016-04-20 NOTE — Discharge Summary (Signed)
[facility] [place] NAME: Brandy [name]    MR#:  [id number]  DATE OF BIRTH:  [birth year]  DATE OF ADMISSION:  04/12/2016   ADMITTING PHYSICIAN: Lytle Butte, MD  DATE OF DISCHARGE: 04/18/2016 12:37 PM  PRIMARY CARE PHYSICIAN: Elgie Collard, MD   ADMISSION DIAGNOSIS:   SOB (shortness of breath) [R06.02] Atrial fibrillation with RVR ([place]) [I48.91]  DISCHARGE DIAGNOSIS:   Principal Problem:   New onset atrial fibrillation (HCC) Active Problems:   Acute respiratory distress   Pulmonary edema   Bronchitis   Acute systolic CHF (congestive heart failure) (HCC)   Nonischemic cardiomyopathy (HCC)   Morbid obesity (HCC)   Atrial fibrillation with RVR (HCC)   SOB (shortness of breath)   SECONDARY DIAGNOSIS:   Past Medical History:  Diagnosis Date  . Obesity     HOSPITAL COURSE:   60y/o F with PMH of Obesity and no other medical problems presents to hospital secondary to worsening shortness of breath and noted to have new onset A. fib with RVR.  #1 new onset atrial fibrillation with rapid ventricular response-started after a viral bronchitis. -Rate was hard to control initially. Now much better on metoprolol orally even with ambulation. -Was on IV heparin for anticoagulation which has been changed to eliquis prior to discharge. -Follow up with cardiology as outpatient and if still remains in atrial fibrillation, anti-arrhythmic medication versus cardioversion can be attempted. -Appreciate cardiology consult. -Also on digoxin.   #2 acute CHF-systolic dysfunction, echo with wall motion abnormalities and EF is 15%. -Nonischemic cardiomyopathy as left heart catheterization did not reveal any obstructive coronary artery disease. 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 Patient is on low dose Lasix. Daily weight checks, salt restriction, fluid restriction have been discussed. -Also on metoprolol. Cannot start ACE inhibitor or  ARB due to blood pressure being low and patient might titration and off for metoprolol to control the heart rate better.  #3 acute respiratory distress on admission-secondary to acute pulmonary edema and CHF exacerbation. -Management as mentioned above. On Lasix. Much improved breathing. Currently on room air.  Patient is doing well and will be discharged home today.  DISCHARGE CONDITIONS:   Stable  CONSULTS OBTAINED:   Treatment Team:  Lytle Butte, MD Wellington Hampshire, MD  DRUG ALLERGIES:   No Known Allergies DISCHARGE MEDICATIONS:     Medication List    STOP taking these medications   Cranberry 250 MG Caps   VITAMIN K2 PO     TAKE these medications   apixaban 5 MG Tabs tablet Commonly known as:  ELIQUIS Take 1 tablet (5 mg total) by mouth 2 (two) times daily.   BERBERINE COMPLEX PO Take 1 capsule by mouth 2 (two) times daily.   digoxin 0.25 MG tablet Commonly known as:  LANOXIN Take 1 tablet (0.25 mg total) by mouth daily.   furosemide 20 MG tablet Commonly known as:  LASIX Take 1 tablet (20 mg total) by mouth daily. In the afternoon   metoprolol 50 MG tablet Commonly known as:  LOPRESSOR Take 1 tablet (50 mg total) by mouth 2 (two) times daily.   Vitamin D3 5000 units Caps Take 1 capsule by mouth daily.        DISCHARGE INSTRUCTIONS:   1. Cardiology follow up in 2 weeks 2. PCP follow-up in 1-2 weeks  DIET:   Cardiac diet  ACTIVITY:   Activity as tolerated  OXYGEN:   Home Oxygen: No.  Oxygen Delivery: room air  DISCHARGE LOCATION:   home   If you experience worsening of your admission symptoms, develop shortness of breath, life threatening emergency, suicidal or homicidal thoughts you must seek medical attention immediately by calling 911 or calling your MD immediately  if symptoms less severe.  You Must read complete instructions/literature along with all the possible adverse reactions/side effects for all the Medicines you take  and that have been prescribed to you. Take any new Medicines after you have completely understood and accpet all the possible adverse reactions/side effects.   Please note  You were cared for by a hospitalist during your hospital stay. If you have any questions about your discharge medications or the care you received while you were in the hospital after you are discharged, you can call the unit and asked to speak with the hospitalist on call if the hospitalist that took care of you is not available. Once you are discharged, your primary care physician will handle any further medical issues. Please note that NO REFILLS for any discharge medications will be authorized once you are discharged, as it is imperative that you return to your primary care physician (or establish a relationship with a primary care physician if you do not have one) for your aftercare needs so that they can reassess your need for medications and monitor your lab values.    On the day of Discharge:  VITAL SIGNS:   Blood pressure 122/64, pulse 83, temperature 98.2 F (36.8 C), temperature source Oral, resp. rate 16, height 5\' 7"  (1.702 m), weight 111.6 kg (246 lb), SpO2 98 %.  PHYSICAL EXAMINATION:    GENERAL:  60 y.o.-year-old patient lying in the bed with no acute distress.  EYES: Pupils equal, round, reactive to light and accommodation. No scleral icterus. Extraocular muscles intact.  HEENT: Head atraumatic, normocephalic. Oropharynx and nasopharynx clear.  NECK:  Supple, no jugular venous distention. No thyroid enlargement, no tenderness.  LUNGS: Normal breath sounds bilaterally, no wheezing, rales,rhonchi or crepitation. No use of accessory muscles of respiration. Decreased bibasilar breath sounds CARDIOVASCULAR: S1, S2 Irregular. No murmurs, rubs, or gallops.  ABDOMEN: Soft, nontender, nondistended. Bowel sounds present. No organomegaly or mass.  EXTREMITIES: No cyanosis, or clubbing. Varicose veins noted on  legs. NEUROLOGIC: Cranial nerves II through XII are intact. Muscle strength 5/5 in all extremities. Sensation intact. Gait not checked.  PSYCHIATRIC: The patient is alert and oriented x 3.  SKIN: No obvious rash, lesion, or ulcer.   DATA REVIEW:   CBC  Recent Labs Lab 04/18/16 0340  WBC 7.0  HGB 16.1*  HCT 47.3*  PLT 293    Chemistries   Recent Labs Lab 04/18/16 0340  NA 140  K 4.1  CL 109  CO2 25  GLUCOSE 95  BUN 15  CREATININE 0.82  CALCIUM 9.1     Microbiology Results  Results for orders placed or performed during the hospital encounter of 04/12/16  MRSA PCR Screening     Status: None   Collection Time: 04/12/16 10:40 PM  Result Value Ref Range Status   MRSA by PCR NEGATIVE NEGATIVE Final    Comment:        The GeneXpert MRSA Assay (FDA approved for NASAL specimens only), is one component of a comprehensive MRSA colonization surveillance program. It is not intended to diagnose MRSA infection nor to guide or monitor treatment for MRSA infections.     RADIOLOGY:  No results found.   Management plans discussed with the patient, family and they are  in agreement.  CODE STATUS:  Code Status History    Date Active Date Inactive Code Status Order ID Comments User Context   04/12/2016  7:55 PM 04/18/2016  3:43 PM Full Code [id number]  Lytle Butte, MD ED    Advance Directive Documentation   [place] Most Recent Value  Type of Advance Directive  Living will  Pre-existing out of facility DNR order (yellow form or pink MOST form)  No data  "MOST" Form in Place?  No data      TOTAL TIME TAKING CARE OF THIS PATIENT: 37 minutes.    [name] M.D on 04/20/2016 at 11:21 AM  Between 7am to 6pm - Pager - [phone]  After 6pm go to [email or web address] - Proofreader  [facility]  Office  [phone]  CC: Primary care physician; Elgie Collard, MD   Note: This dictation was prepared with Dragon  dictation along with smaller phrase technology. Any transcriptional errors that result from this process are unintentional.

## 2016-05-01 ENCOUNTER — Ambulatory Visit (INDEPENDENT_AMBULATORY_CARE_PROVIDER_SITE_OTHER): Payer: BC Managed Care – PPO | Admitting: Nurse Practitioner

## 2016-05-01 ENCOUNTER — Encounter: Payer: Self-pay | Admitting: Nurse Practitioner

## 2016-05-01 VITALS — BP 120/78 | HR 81 | Ht 67.0 in | Wt 245.8 lb

## 2016-05-01 DIAGNOSIS — I4819 Other persistent atrial fibrillation: Secondary | ICD-10-CM

## 2016-05-01 DIAGNOSIS — I481 Persistent atrial fibrillation: Secondary | ICD-10-CM | POA: Diagnosis not present

## 2016-05-01 DIAGNOSIS — H539 Unspecified visual disturbance: Secondary | ICD-10-CM

## 2016-05-01 DIAGNOSIS — I5032 Chronic diastolic (congestive) heart failure: Secondary | ICD-10-CM | POA: Diagnosis not present

## 2016-05-01 DIAGNOSIS — I340 Nonrheumatic mitral (valve) insufficiency: Secondary | ICD-10-CM

## 2016-05-01 DIAGNOSIS — I428 Other cardiomyopathies: Secondary | ICD-10-CM | POA: Diagnosis not present

## 2016-05-01 MED ORDER — METOPROLOL SUCCINATE ER 100 MG PO TB24: 100.0000 mg | ORAL_TABLET | Freq: Every day | ORAL | 6 refills | Status: DC

## 2016-05-01 NOTE — Patient Instructions (Signed)
Medication Instructions:  Your physician has recommended you make the following change in your medication:  1. STOP metoprolol 2. START Toprol XL 100 mg Once daily   Labwork: Basic metabolic panel and digoxin levels checked today.   Follow-Up: Your physician recommends that you schedule a follow-up appointment May 16, 2016 with Christell Faith PA, Dr. Fletcher Anon, or Ignacia Bayley NP  It was a pleasure seeing you today here in the office. Please do not hesitate to give [place] a call back if you have any further questions. [place], BSN

## 2016-05-01 NOTE — Progress Notes (Signed)
Office Visit    Patient Name: RHAELYN HAMBLETON Date of Encounter: 05/01/2016  Primary Care Provider:  Elgie Collard, MD Primary Cardiologist:  Jerilynn Mages. Fletcher Anon, MD   Chief Complaint    60 year old female status post recent admission for atrial fibrillation and nonischemic cardiomyopathy, who presents for follow-up.  Past Medical History    Past Medical History:  Diagnosis Date  . Chronic systolic CHF (congestive heart failure) ([facility])    a. 03/2016 Echo: Ef 15-20%, diff HK, ant AK.  . Moderate mitral regurgitation    a. 03/2016 Echo: mod MR in setting of LV dysfxn.  [name] NICM (nonischemic cardiomyopathy) ([place])    a. 03/2016 Echo: EF 15-20%, diff HK, ant AK, mod MR, mod dil LA, mildly dil RA;  b. 04/2016 Cath: nl cors.  . Obesity   . Persistent atrial fibrillation ([place])    a. Dx 03/2016;  b. CHA2DS2VASc = 2-->Eliquis 5mg  BID. Initial plan for rate control and DCCV later if doesn't convert.   Past Surgical History:  Procedure Laterality Date  . CARDIAC CATHETERIZATION N/A 04/17/2016   Procedure: Left Heart Cath and Coronary Angiography;  Surgeon: Wellington Hampshire, MD;  Location: [facility];  Service: Cardiovascular;  Laterality: N/A;  . CHOLECYSTECTOMY      Allergies  No Known Allergies  History of Present Illness    60 year old female with the above complex past medical history. She was recently admitted to [facility] in the setting of progressive dyspnea, which was initially treated as bronchitis. At [facility], she was found be in rapid atrial fibrillation. Echo showed LV dysfunction with an EF of 15-20% with anterior hypokinesis and moderate mitral regurgitation. She underwent diagnostic catheterization revealing normal coronary arteries. Was ultimately felt that her current myopathy was likely tachycardia induced. She was able to be easily rate controlled on beta blocker and digoxin therapy and was placed on eliquis for anticoagulation with a plan for outpatient  cardioversion in 4 weeks if she remained in A. Fib.  Since her discharge, she has done quite well. Initially, she felt somewhat weak and on two separate occasions briefly experience a "geometric" visual field defect effecting the right upper outer quadrant.  Both times that this occurred, it was only fleeting in nature and was not associated with any other neurologic Ss.  Over the past 3 days, she says she feels as though she was never sick. She denies dyspnea on exertion, PND, orthopnea, dizziness, syncope, edema, chest pain, palpitations, or early satiety. Her weight has been stable at home. She also tracks her heart rate using a wrist-based heart rate monitor and she typically runs in the 70s to 80s. She is tolerating her medications well. She does not routinely check her blood pressure at home and therefore does not know how it runs.  Home Medications    Prior to Admission medications   Medication Sig Start Date End Date Taking? Authorizing Provider  apixaban (ELIQUIS) 5 MG TABS tablet Take 1 tablet (5 mg total) by mouth 2 (two) times daily. 04/18/16  Yes [name], MD  Barberry-Oreg Grape-Goldenseal (BERBERINE COMPLEX PO) Take 1 capsule by mouth 2 (two) times daily.   Yes Historical Provider, MD  Cholecalciferol (VITAMIN D3) 5000 units CAPS Take 1 capsule by mouth daily.   Yes Historical Provider, MD  digoxin (LANOXIN) 0.25 MG tablet Take 1 tablet (0.25 mg total) by mouth daily. 04/19/16  Yes [name], MD  furosemide (LASIX) 20 MG tablet Take 1 tablet (20 mg total) by  mouth daily. In the afternoon 04/18/16  Yes [name], MD  metoprolol (LOPRESSOR) 50 MG tablet Take 1 tablet (50 mg total) by mouth 2 (two) times daily. 04/18/16  Yes [name], MD    Review of Systems    Doing well as above.  She denies chest pain, palpitations, dyspnea, pnd, orthopnea, n, v, dizziness, syncope, edema, weight gain, or early satiety.  All other systems reviewed and are otherwise  negative except as noted above.  Physical Exam    VS:  BP 120/78 (BP Location: Left Arm, Patient Position: Sitting, Cuff Size: Large)   Pulse 81   Ht 5\' 7"  (1.702 m)   Wt 245 lb 12 oz (111.5 kg)   BMI 38.49 kg/m  , BMI Body mass index is 38.49 kg/m. GEN: Well nourished, well developed, in no acute distress.  HEENT: normal.  Neck: Supple, obese, difficult to gauge jvp, no carotid bruits, or masses. Cardiac: IR, IR, no murmurs, rubs, or gallops. No clubbing, cyanosis, edema.  Radials/DP/PT 2+ and equal bilaterally.  Respiratory:  Respirations regular and unlabored, clear to auscultation bilaterally. GI: Soft, nontender, nondistended, BS + x 4. MS: no deformity or atrophy. Skin: warm and dry, no rash. Neuro:  Strength and sensation are intact. Psych: Normal affect.  Accessory Clinical Findings    ECG - Afib, 81, PVC, inferolateral twi   Assessment & Plan    1.  Persistent atrial fibrillation:  Pt was recently admitted with progressive dyspnea and rapid AFib.  She has also been found to have LV dysfxn.  Cath showed nl cors.  She remained in Afib and has been rate controlled on  blocker and digoxin.  She is anticoagulated w/ eliquis in the setting of a CHA2DS2VASc of 2.  She is well rate-controlled today.  I am switching lopressor to toprol xl in the setting of LV dysfxn.  Continue digoxin and check level today.  Continue anticoagulation and f/u on 1/2 with plan to arrange DCCV if still in Afib @ that point.  2. Visual disturbance:  Pt noted two very brief periods of a "geometric" visual field defect impacting the right, upper, outer field following discharge.  No recent symptoms.  No associated weakness or other neurologic Ss.  We discussed imaging to assess for CVA but at this point, she wishes to defer.  Cont eliquis in setting of Afib.  She knows that if this happens again, she should contact us sooner and if Ss persist, she should present to the ED for evaluation.   3.  Nonischemic  cardiomyopathy/chronic systolic congestive heart failure: Euvolemic on exam today.  Wt has been stable @ home.  As above, I am switching her from lopressor to toprol xl.  Since I am making that change, I will not yet add low dose ARB.  We can readdress ARB therapy @ her next visit.  Hopefully EF will improve following DCCV, but if it does not, we should look to changing to [facility] @ some point (assuming she tolerates ARB).  She remains on lasix 20 daily.  I will f/u a bmet today.  4. Moderate mitral regurgitation: Potentially functional in the setting of LV dysfunction. Follow-up echo following restoration of sinus rhythm.  5.  Dispo:  She will have completed 3 wks of uninterrupted anticoagulation on 12/26.  She will f/u 1/2 and if she remains in AF @ that point, we can schedule DCCV.   Murray Hodgkins, NP 05/01/2016, 2:29 PM

## 2016-05-02 ENCOUNTER — Telehealth: Payer: Self-pay | Admitting: *Deleted

## 2016-05-02 LAB — BASIC METABOLIC PANEL
BUN/Creatinine Ratio: 12 (ref 12–28)
BUN: 11 mg/dL (ref 8–27)
CO2: 22 mmol/L (ref 18–29)
Calcium: 9.3 mg/dL (ref 8.7–10.3)
Chloride: 105 mmol/L (ref 96–106)
Creatinine, Ser: 0.92 mg/dL (ref 0.57–1.00)
GFR calc Af Amer: 78 mL/min/{1.73_m2} (ref >59)
GFR calc non Af Amer: 67 mL/min/{1.73_m2} (ref >59)
Glucose: 111 mg/dL — ABNORMAL HIGH (ref 65–99)
Potassium: 4.2 mmol/L (ref 3.5–5.2)
Sodium: 139 mmol/L (ref 134–144)

## 2016-05-02 LAB — DIGOXIN LEVEL: Digoxin, Serum: 0.9 ng/mL (ref 0.5–0.9)

## 2016-05-02 NOTE — Telephone Encounter (Signed)
Left voicemail message to call back  

## 2016-05-02 NOTE — Telephone Encounter (Signed)
-----   Message from Rogelia Mire, NP sent at 05/02/2016  7:51 AM EST ----- Renal fxn stable.  Lytes wnl.  Digoxin level right @ the cusp of too high.  Please have her reduce her digoxin dose to 0.125 mg daily (she can likely just halve her current tabs).

## 2016-05-03 MED ORDER — DIGOXIN 125 MCG PO TABS: 0.1250 mg | ORAL_TABLET | Freq: Every day | ORAL | 3 refills | Status: DC

## 2016-05-03 NOTE — Telephone Encounter (Signed)
Reviewed results and recommendations with patient and instructed her to take 1/2 tablet of her current digoxin and then sent in new prescription for her to start on once she runs out of that. She does have upcoming appointment with Christell Faith PA in January and let her know to call if she has any questions or problems before then. She verbalized understanding of our conversation and had no further questions at this time.

## 2016-05-09 ENCOUNTER — Telehealth: Payer: Self-pay | Admitting: Nurse Practitioner

## 2016-05-09 NOTE — Telephone Encounter (Signed)
Spoke w/ pt. She reports 3rd episode of distorted vision while she was walking in [place]. She felt her HR "jump up", so she stopped and tried to relax. Her HR at that time was 112 and her vision was "like looking though glass". She reports that this was very similar to previous episodes, sx only lasted about 5 mins. Her daughter took her home and she feels fine. HR now 77. She reports that sx did not persist, that they seem to come and go. She reports she has not missed any doses of her Eliquis and was advised to call and let Brandy Bayley, Brandy [name] know if her sx recurred. She wanted to call and have this documented in her chart, as she was advised that she would not need to proceed to the ED unless her sx persisted. Advised her that I will make Brandy [name] aware and call her back if he has any further recommendations.  She is sched to see Brandy Faith, Brandy [name] on 05/16/16 for EKG and plan on proceeding w/ DCCV if she is in afib at that time.

## 2016-05-09 NOTE — Telephone Encounter (Signed)
Patient called and vision is blurred and distorted. She was told to call if this happened again and heart rate is up to 112.

## 2016-05-16 ENCOUNTER — Encounter: Payer: Self-pay | Admitting: Physician Assistant

## 2016-05-16 ENCOUNTER — Ambulatory Visit (INDEPENDENT_AMBULATORY_CARE_PROVIDER_SITE_OTHER): Payer: BC Managed Care – PPO | Admitting: Physician Assistant

## 2016-05-16 VITALS — BP 126/62 | HR 99 | Ht 67.5 in | Wt 248.5 lb

## 2016-05-16 DIAGNOSIS — H539 Unspecified visual disturbance: Secondary | ICD-10-CM

## 2016-05-16 DIAGNOSIS — I340 Nonrheumatic mitral (valve) insufficiency: Secondary | ICD-10-CM

## 2016-05-16 DIAGNOSIS — I481 Persistent atrial fibrillation: Secondary | ICD-10-CM | POA: Diagnosis not present

## 2016-05-16 DIAGNOSIS — I428 Other cardiomyopathies: Secondary | ICD-10-CM | POA: Diagnosis not present

## 2016-05-16 DIAGNOSIS — I5022 Chronic systolic (congestive) heart failure: Secondary | ICD-10-CM

## 2016-05-16 DIAGNOSIS — Z01812 Encounter for preprocedural laboratory examination: Secondary | ICD-10-CM

## 2016-05-16 DIAGNOSIS — I4891 Unspecified atrial fibrillation: Secondary | ICD-10-CM

## 2016-05-16 DIAGNOSIS — I4819 Other persistent atrial fibrillation: Secondary | ICD-10-CM

## 2016-05-16 MED ORDER — FUROSEMIDE 20 MG PO TABS: 20.0000 mg | ORAL_TABLET | Freq: Every day | ORAL | 3 refills | Status: DC

## 2016-05-16 MED ORDER — LOSARTAN POTASSIUM 25 MG PO TABS: 25.0000 mg | ORAL_TABLET | Freq: Every day | ORAL | 3 refills | Status: DC

## 2016-05-16 MED ORDER — APIXABAN 5 MG PO TABS
5.0000 mg | ORAL_TABLET | Freq: Two times a day (BID) | ORAL | 3 refills | Status: DC
Start: 2016-05-16 — End: 2016-08-28

## 2016-05-16 NOTE — Patient Instructions (Addendum)
Medication Instructions:  Your physician has recommended you make the following change in your medication:  START taking losartan 25mg  once daily   Labwork: BMET, CBC, PT/INR, digoxin  Testing/Procedures: Your physician has recommended that you have a Cardioversion (DCCV). Electrical Cardioversion uses a jolt of electricity to your heart either through paddles or wired patches attached to your chest. This is a controlled, usually prescheduled, procedure. Defibrillation is done under light anesthesia in the hospital, and you usually go home the day of the procedure. This is done to get your heart back into a normal rhythm. You are not awake for the procedure. Please see the instruction sheet given to you today.  Friday, January 12 Arrival time: 6:30am at [facility] in at registration (first desk on the right) Nothing to eat or drink after midnight You should take your morning medications (except lasix) with a sip of water. You may take lasix after your procedure. Please have someone with you to drive you home.    Your physician has requested that you have an echocardiogram in 5-6 weeks. Echocardiography is a painless test that uses sound waves to create images of your heart. It provides your doctor with information about the size and shape of your heart and how well your heart's chambers and valves are working. This procedure takes approximately one hour. There are no restrictions for this procedure.    Follow-Up: Your physician recommends that you schedule a follow-up appointment in: 6 weeks (after echo) with Dr. Fletcher Anon.    Any Other Special Instructions Will Be Listed Below (If Applicable).     If you need a refill on your cardiac medications before your next appointment, please call your pharmacy.  Echocardiogram An echocardiogram, or echocardiography, uses sound waves (ultrasound) to produce an image of your heart. The echocardiogram is simple, painless,  obtained within a short period of time, and offers valuable information to your health care provider. The images from an echocardiogram can provide information such as:  Evidence of coronary artery disease (CAD).  Heart size.  Heart muscle function.  Heart valve function.  Aneurysm detection.  Evidence of a past heart attack.  Fluid buildup around the heart.  Heart muscle thickening.  Assess heart valve function. Tell a health care provider about:  Any allergies you have.  All medicines you are taking, including vitamins, herbs, eye drops, creams, and over-the-counter medicines.  Any problems you or family members have had with anesthetic medicines.  Any blood disorders you have.  Any surgeries you have had.  Any medical conditions you have.  Whether you are pregnant or may be pregnant. What happens before the procedure? No special preparation is needed. Eat and drink normally. What happens during the procedure?  In order to produce an image of your heart, gel will be applied to your chest and a wand-like tool (transducer) will be moved over your chest. The gel will help transmit the sound waves from the transducer. The sound waves will harmlessly bounce off your heart to allow the heart images to be captured in real-time motion. These images will then be recorded.  You may need an IV to receive a medicine that improves the quality of the pictures. What happens after the procedure? You may return to your normal schedule including diet, activities, and medicines, unless your health care provider tells you otherwise. This information is not intended to replace advice given to you by your health care provider. Make sure you discuss any questions you  have with your health care provider. Document Released: 04/28/2000 Document Revised: 12/18/2015 Document Reviewed: 01/06/2013 Elsevier Interactive Patient Education  2017 [employer].  Hospital doctor  cardioversion is the delivery of a jolt of electricity to restore a normal rhythm to the heart. A rhythm that is too fast or is not regular keeps the heart from pumping well. In this procedure, sticky patches or metal paddles are placed on the chest to deliver electricity to the heart from a device. This procedure may be done in an emergency if:  There is low or no blood pressure as a result of the heart rhythm.  Normal rhythm must be restored as fast as possible to protect the brain and heart from further damage.  It may save a life. This procedure may also be done for irregular or fast heart rhythms that are not immediately life-threatening. Tell a health care provider about:  Any allergies you have.  All medicines you are taking, including vitamins, herbs, eye drops, creams, and over-the-counter medicines.  Any problems you or family members have had with anesthetic medicines.  Any blood disorders you have.  Any surgeries you have had.  Any medical conditions you have.  Whether you are pregnant or may be pregnant. What are the risks? Generally, this is a safe procedure. However, problems may occur, including:  Allergic reactions to medicines.  A blood clot that breaks free and travels to other parts of your body.  The possible return of an abnormal heart rhythm within hours or days after the procedure.  Your heart stopping (cardiac arrest). This is rare. What happens before the procedure? Medicines  Your health care provider may have you start taking:  Blood-thinning medicines (anticoagulants) so your blood does not clot as easily.  Medicines may be given to help stabilize your heart rate and rhythm.  Ask your health care provider about changing or stopping your regular medicines. This is especially important if you are taking diabetes medicines or blood thinners. General instructions  Plan to have someone take you home from the hospital or clinic.  If you will be  going home right after the procedure, plan to have someone with you for 24 hours.  Follow instructions from your health care provider about eating or drinking restrictions. What happens during the procedure?  To lower your risk of infection:  Your health care team will wash or sanitize their hands.  Your skin will be washed with soap.  An IV tube will be inserted into one of your veins.  You will be given a medicine to help you relax (sedative).  Sticky patches (electrodes) or metal paddles may be placed on your chest.  An electrical shock will be delivered. The procedure may vary among health care providers and hospitals. What happens after the procedure?  Your blood pressure, heart rate, breathing rate, and blood oxygen level will be monitored until the medicines you were given have worn off.  Do not drive for 24 hours if you were given a sedative.  Your heart rhythm will be watched to make sure it does not change. This information is not intended to replace advice given to you by your health care provider. Make sure you discuss any questions you have with your health care provider. Document Released: 04/21/2002 Document Revised: 12/29/2015 Document Reviewed: 11/05/2015 Elsevier Interactive Patient Education  2017 [employer].  Electrical Cardioversion, Care After This sheet gives you information about how to care for yourself after your procedure. Your health care provider  may also give you more specific instructions. If you have problems or questions, contact your health care provider. What can I expect after the procedure? After the procedure, it is common to have:  Some redness on the skin where the shocks were given. Follow these instructions at home:  Do not drive for 24 hours if you were given a medicine to help you relax (sedative).  Take over-the-counter and prescription medicines only as told by your health care provider.  Ask your health care provider how to  check your pulse. Check it often.  Rest for 48 hours after the procedure or as told by your health care provider.  Avoid or limit your caffeine use as told by your health care provider. Contact a health care provider if:  You feel like your heart is beating too quickly or your pulse is not regular.  You have a serious muscle cramp that does not go away. Get help right away if:  You have discomfort in your chest.  You are dizzy or you feel faint.  You have trouble breathing or you are short of breath.  Your speech is slurred.  You have trouble moving an arm or leg on one side of your body.  Your fingers or toes turn cold or blue. This information is not intended to replace advice given to you by your health care provider. Make sure you discuss any questions you have with your health care provider. Document Released: 02/19/2013 Document Revised: 12/03/2015 Document Reviewed: 11/05/2015 Elsevier Interactive Patient Education  2017 [employer].

## 2016-05-16 NOTE — Progress Notes (Signed)
Cardiology Office Note Date:  05/16/2016  Patient ID:  Brandy [name], Brandy [name] [birth year], MRN [id number] PCP:  Elgie Collard, MD  Cardiologist:  Dr. Fletcher Anon, MD    Chief Complaint: Afib follow up  History of Present Illness: Brandy [name] is a 61 y.o. female with history of persistent Afib on Eliquis, NICM/chronic systolic CHF, moderate mitral regurgitation, and obesity who presents for follow up of her Afib. She was admitted to [facility] in 03/2016 for progressive dyspnea that was initially treated as bronchitis. In the hospital she was noted to be in rapid Afib. Echo showed LV dysfunction with an EF of 15-20% with anterior hypokinesis and moderate mitral regurgitation. She underwent diagnostic cardiac cath that revealed normal coronary arteries. It was ultimately felt her cardiomyopathy was likely tachycardia induced. Her heart rate was well controlled with beta blocker and digoxin. She was started on Eliquis for anticoagulation. In follow up on 05/01/16 she had done quite well. She did note some "geometric" visual field disturbances in the right upper quadrant of her vision x 2 without any other symptoms. She declined imaging. She remained in Afib with controlled ventricular response. Her Lopressor was changed to Toprol XL given her cardiomyopathy. Digoxin level was checked and found to be 0.9 leading to a reduced dose of digoxin of 0.125 mg daily. She called on 12/26 with a reported 3rd episode of distorted vision. Symptoms lasted approximately 1-2 minutes and self resolved.  She comes in today doing well. Heart rate at home has ranged from the 80's bpm to 110's bpm with exertion. Asymptomatic. No further vision disturbances. Has not missed any doses of Eliquis and is taking this medication twice daily. Tolerating Toprol XL and digoxin without issues. BP at home has been in the [id number] to [id number] systolic. Weight stable. She does snore at nighttime and has never had a sleep study. Rarely drinks alcohol. Drinks  one cup of coffee on the weekends. Enjoys chocolate. She does relate a significant amount of stress at work when she was diagnosed with Afib. Overall, feels much better than when she was admitted to [facility].     Past Medical History:  Diagnosis Date  . Chronic systolic CHF (congestive heart failure) ([place])    a. 03/2016 Echo: Ef 15-20%, diff HK, ant AK.  . Moderate mitral regurgitation    a. 03/2016 Echo: mod MR in setting of LV dysfxn.  Brandy [name] NICM (nonischemic cardiomyopathy) ([place])    a. 03/2016 Echo: EF 15-20%, diff HK, ant AK, mod MR, mod dil LA, mildly dil RA;  b. 04/2016 Cath: nl cors.  . Obesity   . Persistent atrial fibrillation (Brandy [name])    a. Dx 03/2016;  b. CHA2DS2VASc = 2-->Eliquis 5mg  BID. Initial plan for rate control and DCCV later if doesn't convert.    Past Surgical History:  Procedure Laterality Date  . CARDIAC CATHETERIZATION N/A 04/17/2016   Procedure: Left Heart Cath and Coronary Angiography;  Surgeon: Wellington Hampshire, MD;  Location: [facility];  Service: Cardiovascular;  Laterality: N/A;  . CHOLECYSTECTOMY      Current Outpatient Prescriptions  Medication Sig Dispense Refill  . apixaban (ELIQUIS) 5 MG TABS tablet Take 1 tablet (5 mg total) by mouth 2 (two) times daily. 60 tablet 2  . Barberry-Oreg Grape-Goldenseal (BERBERINE COMPLEX PO) Take 1 capsule by mouth 2 (two) times daily.    . Cholecalciferol (VITAMIN D3) 5000 units CAPS Take 1 capsule by mouth daily.    . digoxin (LANOXIN) 0.125 MG tablet Take 1  tablet (0.125 mg total) by mouth daily. 30 tablet 3  . furosemide (LASIX) 20 MG tablet Take 1 tablet (20 mg total) by mouth daily. In the afternoon 30 tablet 2  . metoprolol succinate (TOPROL-XL) 100 MG 24 hr tablet Take 1 tablet (100 mg total) by mouth daily. Take with or immediately following a meal. 30 tablet 6   No current facility-administered medications for this visit.     Allergies:   Patient has no known allergies.   Social History:  The patient   reports that she has never smoked. She has never used smokeless tobacco. She reports that she does not drink alcohol.   Family History:  The patient's family history includes COPD in her father and mother; Heart disease in her father.  ROS:   Review of Systems  Constitutional: Positive for malaise/fatigue. Negative for chills, diaphoresis, fever and weight loss.  HENT: Negative for congestion.   Eyes: Negative for discharge and redness.  Respiratory: Negative for cough, hemoptysis, sputum production, shortness of breath and wheezing.   Cardiovascular: Positive for leg swelling. Negative for chest pain, palpitations, orthopnea, claudication and PND.  Gastrointestinal: Negative for abdominal pain, blood in stool, heartburn, melena, nausea and vomiting.  Genitourinary: Negative for hematuria.  Musculoskeletal: Negative for falls and myalgias.  Skin: Negative for rash.  Neurological: Positive for weakness. Negative for dizziness, tingling, tremors, sensory change, speech change, focal weakness and loss of consciousness.  Endo/Heme/Allergies: Does not bruise/bleed easily.  Psychiatric/Behavioral: Negative for substance abuse. The patient is not nervous/anxious.   All other systems reviewed and are negative.    PHYSICAL EXAM:  VS:  BP 126/62 (BP Location: Left Arm, Patient Position: Sitting, Cuff Size: Normal)   Pulse 99   Ht 5' 7.5" (1.715 m)   Wt 248 lb 8 oz (112.7 kg)   BMI 38.35 kg/m  BMI: Body mass index is 38.35 kg/m.  Physical Exam  Constitutional: She is oriented to person, place, and time. She appears well-developed and well-nourished.  HENT:  Head: Normocephalic and atraumatic.  Eyes: Right eye exhibits no discharge. Left eye exhibits no discharge.  Neck: Normal range of motion. No JVD present.  Cardiovascular: Normal rate, S1 normal and S2 normal.  An irregularly irregular rhythm present. Exam reveals no distant heart sounds, no friction rub, no midsystolic click and no  opening snap.   Murmur heard. High-pitched blowing holosystolic murmur is present with a grade of 2/6  at the apex Pulmonary/Chest: Effort normal and breath sounds normal. No respiratory distress. She has no decreased breath sounds. She has no wheezes. She has no rales. She exhibits no tenderness.  Abdominal: Soft. She exhibits no distension. There is no tenderness.  Musculoskeletal: She exhibits no edema.  Trace bilateral pre-tibial edema.   Neurological: She is alert and oriented to person, place, and time.  Skin: Skin is warm and dry. No cyanosis. Nails show no clubbing.  Psychiatric: She has a normal mood and affect. Her speech is normal and behavior is normal. Judgment and thought content normal.     EKG:  Was ordered and interpreted by me today. Shows Afib, 99 bpm, low voltage QRS, non specific inferolateral st/t changes (old)  Recent Labs: 04/12/2016: B Natriuretic Peptide 297.0; TSH 3.040 04/13/2016: Magnesium 2.1 04/18/2016: Hemoglobin 16.1; Platelets 293 05/01/2016: BUN 11; Creatinine, Ser 0.92; Potassium 4.2; Sodium 139    Wt Readings from Last 3 Encounters:  05/16/16 248 lb 8 oz (112.7 kg)  05/01/16 245 lb 12 oz (111.5 kg)  04/17/16  246 lb (111.6 kg)     Other studies reviewed: Additional studies/records reviewed today include: summarized above  ASSESSMENT AND PLAN:  1. Persistent Afib: Currently in Afib with heart rates in the 80's to 90's bpm. Continue Toprol XL 100 mg daily and digoxin 0.125 mg daily for rate control. Continue Eliquis 5 mg bid (she has not missed any doses of this medications and has been taking it twice daily). CHADS2VASc at least 2 (CHF, sex category). Plan for DCCV the following week for restore sinus rhythm. I did not titrate rate controlling medication at this time as we do not know what her sinus rate is at baseline and there is concern for possible bradycardia upon restoring sinus rhythm. I did not add CCB for the above reason as well as 2/2 her  cardiomyopathy. Schedule pulmonary evaluation for possible sleep apnea. Weight loss is advised. Check digoxin level today.   2. NICM/chronic systolic CHF: She does not appear to be volume overloaded at this time. Continue Toprol XL 100 mg daily and digoxin. Add losartan 25 mg daily. Plan to recheck echo 4 weeks after cardioversion to evaluate for improved LV systolic function. If her EF remains reduced at less than 40% plan for placing on Entresto in place of losartan. Continue Lasix 20 mg daily. Check renal function today.   3. Vision disturbances: Resolved. Does not want workup at this time.   4. Moderate mitral regurgitation: Check echo as above.   Disposition: F/u with me in 5-6 weeks s/p DCCV and follow up echo as above.   Current medicines are reviewed at length with the patient today.  The patient did not have any concerns regarding medicines.  Melvern Banker PA-C 05/16/2016 3:27 PM     [address] [place], [place] [address] [phone]

## 2016-05-17 LAB — BASIC METABOLIC PANEL
BUN/Creatinine Ratio: 16 (ref 12–28)
BUN: 12 mg/dL (ref 8–27)
CO2: 22 mmol/L (ref 18–29)
Calcium: 9.8 mg/dL (ref 8.7–10.3)
Chloride: 104 mmol/L (ref 96–106)
Creatinine, Ser: 0.77 mg/dL (ref 0.57–1.00)
GFR calc Af Amer: 96 mL/min/{1.73_m2} (ref >59)
GFR calc non Af Amer: 84 mL/min/{1.73_m2} (ref >59)
Glucose: 120 mg/dL — ABNORMAL HIGH (ref 65–99)
Potassium: 4.2 mmol/L (ref 3.5–5.2)
Sodium: 142 mmol/L (ref 134–144)

## 2016-05-17 LAB — PROTIME-INR
INR: 1 (ref 0.8–1.2)
Prothrombin Time: 10.8 s (ref 9.1–12.0)

## 2016-05-17 LAB — CBC
Hematocrit: 44 % (ref 34.0–46.6)
Hemoglobin: 14.7 g/dL (ref 11.1–15.9)
MCH: 29.2 pg (ref 26.6–33.0)
MCHC: 33.4 g/dL (ref 31.5–35.7)
MCV: 88 fL (ref 79–97)
Platelets: 258 10*3/uL (ref 150–379)
RBC: 5.03 x10E6/uL (ref 3.77–5.28)
RDW: 13.9 % (ref 12.3–15.4)
WBC: 9.8 10*3/uL (ref 3.4–10.8)

## 2016-05-17 LAB — DIGOXIN LEVEL: Digoxin, Serum: 0.4 ng/mL — ABNORMAL LOW (ref 0.5–0.9)

## 2016-05-25 ENCOUNTER — Telehealth: Payer: Self-pay | Admitting: *Deleted

## 2016-05-25 NOTE — Telephone Encounter (Signed)
No answer. Left detailed message, ok per DPR, with instructions for DCCV tomorrow and to arrive at 0630 at [facility] mall and to call back if she has any questions.

## 2016-05-26 ENCOUNTER — Ambulatory Visit: Payer: BC Managed Care – PPO | Admitting: Anesthesiology

## 2016-05-26 ENCOUNTER — Ambulatory Visit
Admission: RE | Admit: 2016-05-26 | Discharge: 2016-05-26 | Disposition: A | Discharge status: Home / Self Care | Payer: BC Managed Care – PPO | Source: Ambulatory Visit | Attending: Cardiovascular Disease | Admitting: Cardiovascular Disease

## 2016-05-26 ENCOUNTER — Encounter
Admission: RE | Disposition: A | Discharge status: Home / Self Care | Payer: Self-pay | Source: Ambulatory Visit | Attending: Cardiovascular Disease

## 2016-05-26 DIAGNOSIS — Z7901 Long term (current) use of anticoagulants: Secondary | ICD-10-CM | POA: Insufficient documentation

## 2016-05-26 DIAGNOSIS — H539 Unspecified visual disturbance: Secondary | ICD-10-CM | POA: Insufficient documentation

## 2016-05-26 DIAGNOSIS — E669 Obesity, unspecified: Secondary | ICD-10-CM | POA: Diagnosis not present

## 2016-05-26 DIAGNOSIS — I4811 Longstanding persistent atrial fibrillation: Secondary | ICD-10-CM

## 2016-05-26 DIAGNOSIS — I340 Nonrheumatic mitral (valve) insufficiency: Secondary | ICD-10-CM | POA: Insufficient documentation

## 2016-05-26 DIAGNOSIS — I5022 Chronic systolic (congestive) heart failure: Secondary | ICD-10-CM | POA: Diagnosis not present

## 2016-05-26 DIAGNOSIS — Z6838 Body mass index (BMI) 38.0-38.9, adult: Secondary | ICD-10-CM | POA: Diagnosis not present

## 2016-05-26 DIAGNOSIS — I429 Cardiomyopathy, unspecified: Secondary | ICD-10-CM | POA: Insufficient documentation

## 2016-05-26 DIAGNOSIS — I4819 Other persistent atrial fibrillation: Secondary | ICD-10-CM

## 2016-05-26 DIAGNOSIS — I481 Persistent atrial fibrillation: Secondary | ICD-10-CM

## 2016-05-26 HISTORY — PX: ELECTROPHYSIOLOGIC STUDY: SHX172A

## 2016-05-26 SURGERY — CARDIOVERSION (CATH LAB)
Anesthesia: General

## 2016-05-26 MED ORDER — PROPOFOL 10 MG/ML IV BOLUS
INTRAVENOUS | Status: AC
  Filled 2016-05-26: qty 20

## 2016-05-26 MED ORDER — SODIUM CHLORIDE 0.9 % IV SOLN
INTRAVENOUS | Status: DC
  Administered 2016-05-26: 07:00:00 via INTRAVENOUS

## 2016-05-26 MED ORDER — LIDOCAINE 2% (20 MG/ML) 5 ML SYRINGE
INTRAMUSCULAR | Status: AC
  Filled 2016-05-26: qty 5

## 2016-05-26 MED ORDER — LIDOCAINE HCL (PF) 2 % IJ SOLN
INTRAMUSCULAR | Status: DC | PRN
  Administered 2016-05-26: 60 mL via INTRADERMAL

## 2016-05-26 MED ORDER — PROPOFOL 10 MG/ML IV BOLUS
INTRAVENOUS | Status: DC | PRN
Start: 2016-05-26 — End: 2016-05-26
  Administered 2016-05-26: 30 mg via INTRAVENOUS
  Administered 2016-05-26: 50 mg via INTRAVENOUS
  Administered 2016-05-26: 30 mg via INTRAVENOUS

## 2016-05-26 NOTE — CV Procedure (Signed)
Cardioversion note: A standard informed consent was obtained. Timeout was performed. The pads were placed in the anterior posterior fashion. The patient was given propofol by the anesthesia team.  Attempted cardioversion was performed with a 200 J X 4 without restoration of sinus rhythm. The patient tolerated the procedure with no immediate complications.  Recommendations:  Unsuccessful cardioversion to sinus rhythm after 4 X 200 J shocks. After the first 2 shocks, the patient remained in atrial fibrillation. After the third and fourth shocks, the patient converted to sinus rhythm for only about 5 beats and then went back in atrial fibrillation.  I recommend continuing current medications and referring the patient to EP for evaluation. She will probably require an antiarrhythmic medication.

## 2016-05-26 NOTE — Interval H&P Note (Signed)
History and Physical Interval Note:  05/26/2016 8:15 AM  Brandy [name]  has presented today for surgery, with the diagnosis of Cardioversion due to A-Fib  The various methods of treatment have been discussed with the patient and family. After consideration of risks, benefits and other options for treatment, the patient has consented to  Procedure(s): Cardioversion (N/A) as a surgical intervention .  The patient's history has been reviewed, patient examined, no change in status, stable for surgery.  I have reviewed the patient's chart and labs.  Questions were answered to the patient's satisfaction.     Kathlyn [place]

## 2016-05-26 NOTE — Anesthesia Preprocedure Evaluation (Signed)
Anesthesia Evaluation  Patient identified by MRN, date of birth, ID band Patient awake    Reviewed: Allergy & Precautions, NPO status , Patient's Chart, lab work & pertinent test results  History of Anesthesia Complications Negative for: history of anesthetic complications  Airway Mallampati: III  TM Distance: >3 FB Neck ROM: Full    Dental no notable dental hx.    Pulmonary neg pulmonary ROS, neg sleep apnea, neg COPD,    breath sounds clear to auscultation- rhonchi (-) wheezing      Cardiovascular Exercise Tolerance: Good hypertension, Pt. on medications (-) CAD and (-) Past MI + dysrhythmias Atrial Fibrillation  Rhythm:Regular Rate:Normal - Systolic murmurs and - Diastolic murmurs L heart cath 04/17/16: 1. Normal coronary arteries. 2. Severely dilated left ventricle with Normal left ventricular end-diastolic pressure. Left ventricular angiography was not performed. EF was severely dilated by echo.  Echo 04/13/16: - Left ventricle: The cavity size was moderately dilated. Wall   thickness was normal. Systolic function was severely reduced. The   estimated ejection fraction was in the range of 15% to 20%.   Akinesis of the anterior myocardium. Diffuse hypokinesis of other   walls. - Mitral valve: There was moderate regurgitation. - Left atrium: The atrium was moderately dilated. - Right atrium: The atrium was mildly dilated. - Pericardium, extracardiac: There was a left pleural effusion.   Neuro/Psych negative neurological ROS  negative psych ROS   GI/Hepatic negative GI ROS, Neg liver ROS,   Endo/Other  negative endocrine ROSneg diabetes  Renal/GU negative Renal ROS     Musculoskeletal negative musculoskeletal ROS (+)   Abdominal (+) + obese,   Peds  Hematology negative hematology ROS (+)   Anesthesia Other Findings Past Medical History: No date: Chronic systolic CHF (congestive heart failure*     Comment:  a. 03/2016 Echo: Ef 15-20%, diff HK, ant AK. No date: Moderate mitral regurgitation     Comment: a. 03/2016 Echo: mod MR in setting of LV               dysfxn. No date: NICM (nonischemic cardiomyopathy) ([place])     Comment: a. 03/2016 Echo: EF 15-20%, diff HK, ant AK,               mod MR, mod dil LA, mildly dil RA;  b. 04/2016               Cath: nl cors. No date: Obesity No date: Persistent atrial fibrillation ([place])     Comment: a. Dx 03/2016;  b. CHA2DS2VASc = 2-->Eliquis               5mg  BID. Initial plan for rate control and DCCV              later if doesn't convert.   Reproductive/Obstetrics                             Anesthesia Physical Anesthesia Plan  ASA: III  Anesthesia Plan: General   Post-op Pain Management:    Induction: Intravenous  Airway Management Planned: Natural Airway  Additional Equipment:   Intra-op Plan:   Post-operative Plan:   Informed Consent: I have reviewed the patients History and Physical, chart, labs and discussed the procedure including the risks, benefits and alternatives for the proposed anesthesia with the patient or authorized representative who has indicated his/her understanding and acceptance.   Dental advisory given  Plan Discussed with: CRNA and Anesthesiologist  Anesthesia Plan Comments:         Anesthesia Quick Evaluation

## 2016-05-26 NOTE — Progress Notes (Signed)
MD notified that orders need to be placed for procedure. Acknowledged.

## 2016-05-26 NOTE — Discharge Instructions (Signed)
moder Moderate Conscious Sedation, Adult, Care After These instructions provide you with information about caring for yourself after your procedure. Your health care provider may also give you more specific instructions. Your treatment has been planned according to current medical practices, but problems sometimes occur. Call your health care provider if you have any problems or questions after your procedure. What can I expect after the procedure? After your procedure, it is common:  To feel sleepy for several hours.  To feel clumsy and have poor balance for several hours.  To have poor judgment for several hours.  To vomit if you eat too soon. Follow these instructions at home: For at least 24 hours after the procedure:   Do not:  Participate in activities where you could fall or become injured.  Drive.  Use heavy machinery.  Drink alcohol.  Take sleeping pills or medicines that cause drowsiness.  Make important decisions or sign legal documents.  Take care of children on your own.  Rest. Eating and drinking  Follow the diet recommended by your health care provider.  If you vomit:  Drink water, juice, or soup when you can drink without vomiting.  Make sure you have little or no nausea before eating solid foods. General instructions  Have a responsible adult stay with you until you are awake and alert.  Take over-the-counter and prescription medicines only as told by your health care provider.  If you smoke, do not smoke without supervision.  Keep all follow-up visits as told by your health care provider. This is important. Contact a health care provider if:  You keep feeling nauseous or you keep vomiting.  You feel light-headed.  You develop a rash.  You have a fever. Get help right away if:  You have trouble breathing. This information is not intended to replace advice given to you by your health care provider. Make sure you discuss any questions you  have with your health care provider. Document Released: 02/19/2013 Document Revised: 10/04/2015 Document Reviewed: 08/21/2015 Elsevier Interactive Patient Education  2017 [employer]. Electrical Cardioversion, Care After This sheet gives you information about how to care for yourself after your procedure. Your health care provider may also give you more specific instructions. If you have problems or questions, contact your health care provider. What can I expect after the procedure? After the procedure, it is common to have:  Some redness on the skin where the shocks were given. Follow these instructions at home:  Do not drive for 24 hours if you were given a medicine to help you relax (sedative).  Take over-the-counter and prescription medicines only as told by your health care provider.  Ask your health care provider how to check your pulse. Check it often.  Rest for 48 hours after the procedure or as told by your health care provider.  Avoid or limit your caffeine use as told by your health care provider. Contact a health care provider if:  You feel like your heart is beating too quickly or your pulse is not regular.  You have a serious muscle cramp that does not go away. Get help right away if:  You have discomfort in your chest.  You are dizzy or you feel faint.  You have trouble breathing or you are short of breath.  Your speech is slurred.  You have trouble moving an arm or leg on one side of your body.  Your fingers or toes turn cold or blue. This information is not intended to  replace advice given to you by your health care provider. Make sure you discuss any questions you have with your health care provider. Document Released: 02/19/2013 Document Revised: 12/03/2015 Document Reviewed: 11/05/2015 Elsevier Interactive Patient Education  2017 [employer].

## 2016-05-26 NOTE — H&P (View-Only) (Signed)
Cardiology Office Note Date:  05/16/2016  Patient ID:  Brandy, [name] February 15, 1956, MRN [id number] PCP:  Brandy Collard, MD  Cardiologist:  Dr. Fletcher Anon, MD    Chief Complaint: Afib follow up  History of Present Illness: Brandy [name] is a 61 y.o. female with history of persistent Afib on Eliquis, NICM/chronic systolic CHF, moderate mitral regurgitation, and obesity who presents for follow up of her Afib. She was admitted to [facility] in 03/2016 for progressive dyspnea that was initially treated as bronchitis. In the hospital she was noted to be in rapid Afib. Echo showed LV dysfunction with an EF of 15-20% with anterior hypokinesis and moderate mitral regurgitation. She underwent diagnostic cardiac cath that revealed normal coronary arteries. It was ultimately felt her cardiomyopathy was likely tachycardia induced. Her heart rate was well controlled with beta blocker and digoxin. She was started on Eliquis for anticoagulation. In follow up on 05/01/16 she had done quite well. She did note some "geometric" visual field disturbances in the right upper quadrant of her vision x 2 without any other symptoms. She declined imaging. She remained in Afib with controlled ventricular response. Her Lopressor was changed to Toprol XL given her cardiomyopathy. Digoxin level was checked and found to be 0.9 leading to a reduced dose of digoxin of 0.125 mg daily. She called on 12/26 with a reported 3rd episode of distorted vision. Symptoms lasted approximately 1-2 minutes and self resolved.  She comes in today doing well. Heart rate at home has ranged from the 80's bpm to 110's bpm with exertion. Asymptomatic. No further vision disturbances. Has not missed any doses of Eliquis and is taking this medication twice daily. Tolerating Toprol XL and digoxin without issues. BP at home has been in the [id number] to [id number] systolic. Weight stable. She does snore at nighttime and has never had a sleep study. Rarely drinks alcohol. Drinks  one cup of coffee on the weekends. Enjoys chocolate. She does relate a significant amount of stress at work when she was diagnosed with Afib. Overall, feels much better than when she was admitted to [facility].     Past Medical History:  Diagnosis Date  . Chronic systolic CHF (congestive heart failure) ([place])    a. 03/2016 Echo: Ef 15-20%, diff HK, ant AK.  . Moderate mitral regurgitation    a. 03/2016 Echo: mod MR in setting of LV dysfxn.  [name] NICM (nonischemic cardiomyopathy) ([place])    a. 03/2016 Echo: EF 15-20%, diff HK, ant AK, mod MR, mod dil LA, mildly dil RA;  b. 04/2016 Cath: nl cors.  . Obesity   . Persistent atrial fibrillation ([place])    a. Dx 03/2016;  b. CHA2DS2VASc = 2-->Eliquis 5mg  BID. Initial plan for rate control and DCCV later if doesn't convert.    Past Surgical History:  Procedure Laterality Date  . CARDIAC CATHETERIZATION N/A 04/17/2016   Procedure: Left Heart Cath and Coronary Angiography;  Surgeon: [place] Hampshire, MD;  Location: [place] [facility];  Service: Cardiovascular;  Laterality: N/A;  . CHOLECYSTECTOMY      Current Outpatient Prescriptions  Medication Sig Dispense Refill  . apixaban (ELIQUIS) 5 MG TABS tablet Take 1 tablet (5 mg total) by mouth 2 (two) times daily. 60 tablet 2  . Barberry-Oreg Grape-Goldenseal (BERBERINE COMPLEX PO) Take 1 capsule by mouth 2 (two) times daily.    . Cholecalciferol (VITAMIN D3) 5000 units CAPS Take 1 capsule by mouth daily.    . digoxin (LANOXIN) 0.125 MG tablet Take 1  tablet (0.125 mg total) by mouth daily. 30 tablet 3  . furosemide (LASIX) 20 MG tablet Take 1 tablet (20 mg total) by mouth daily. In the afternoon 30 tablet 2  . metoprolol succinate (TOPROL-XL) 100 MG 24 hr tablet Take 1 tablet (100 mg total) by mouth daily. Take with or immediately following a meal. 30 tablet 6   No current facility-administered medications for this visit.     Allergies:   Patient has no known allergies.   Social History:  The patient   reports that she has never smoked. She has never used smokeless tobacco. She reports that she does not drink alcohol.   Family History:  The patient's family history includes COPD in her father and mother; Heart disease in her father.  ROS:   Review of Systems  Constitutional: Positive for malaise/fatigue. Negative for chills, diaphoresis, fever and weight loss.  HENT: Negative for congestion.   Eyes: Negative for discharge and redness.  Respiratory: Negative for cough, hemoptysis, sputum production, shortness of breath and wheezing.   Cardiovascular: Positive for leg swelling. Negative for chest pain, palpitations, orthopnea, claudication and PND.  Gastrointestinal: Negative for abdominal pain, blood in stool, heartburn, melena, nausea and vomiting.  Genitourinary: Negative for hematuria.  Musculoskeletal: Negative for falls and myalgias.  Skin: Negative for rash.  Neurological: Positive for weakness. Negative for dizziness, tingling, tremors, sensory change, speech change, focal weakness and loss of consciousness.  Endo/Heme/Allergies: Does not bruise/bleed easily.  Psychiatric/Behavioral: Negative for substance abuse. The patient is not nervous/anxious.   All other systems reviewed and are negative.    PHYSICAL EXAM:  VS:  BP 126/62 (BP Location: Left Arm, Patient Position: Sitting, Cuff Size: Normal)   Pulse 99   Ht 5' 7.5" (1.715 m)   Wt 248 lb 8 oz (112.7 kg)   BMI 38.35 kg/m  BMI: Body mass index is 38.35 kg/m.  Physical Exam  Constitutional: She is oriented to person, place, and time. She appears well-developed and well-nourished.  HENT:  Head: Normocephalic and atraumatic.  Eyes: Right eye exhibits no discharge. Left eye exhibits no discharge.  Neck: Normal range of motion. No JVD present.  Cardiovascular: Normal rate, S1 normal and S2 normal.  An irregularly irregular rhythm present. Exam reveals no distant heart sounds, no friction rub, no midsystolic click and no  opening snap.   Murmur heard. High-pitched blowing holosystolic murmur is present with a grade of 2/6  at the apex Pulmonary/Chest: Effort normal and breath sounds normal. No respiratory distress. She has no decreased breath sounds. She has no wheezes. She has no rales. She exhibits no tenderness.  Abdominal: Soft. She exhibits no distension. There is no tenderness.  Musculoskeletal: She exhibits no edema.  Trace bilateral pre-tibial edema.   Neurological: She is alert and oriented to person, place, and time.  Skin: Skin is warm and dry. No cyanosis. Nails show no clubbing.  Psychiatric: She has a normal mood and affect. Her speech is normal and behavior is normal. Judgment and thought content normal.     EKG:  Was ordered and interpreted by me today. Shows Afib, 99 bpm, low voltage QRS, non specific inferolateral st/t changes (old)  Recent Labs: 04/12/2016: B Natriuretic Peptide 297.0; TSH 3.040 04/13/2016: Magnesium 2.1 04/18/2016: Hemoglobin 16.1; Platelets 293 05/01/2016: BUN 11; Creatinine, Ser 0.92; Potassium 4.2; Sodium 139    Wt Readings from Last 3 Encounters:  05/16/16 248 lb 8 oz (112.7 kg)  05/01/16 245 lb 12 oz (111.5 kg)  04/17/16  246 lb (111.6 kg)     Other studies reviewed: Additional studies/records reviewed today include: summarized above  ASSESSMENT AND PLAN:  1. Persistent Afib: Currently in Afib with heart rates in the 80's to 90's bpm. Continue Toprol XL 100 mg daily and digoxin 0.125 mg daily for rate control. Continue Eliquis 5 mg bid (she has not missed any doses of this medications and has been taking it twice daily). CHADS2VASc at least 2 (CHF, sex category). Plan for DCCV the following week for restore sinus rhythm. I did not titrate rate controlling medication at this time as we do not know what her sinus rate is at baseline and there is concern for possible bradycardia upon restoring sinus rhythm. I did not add CCB for the above reason as well as 2/2 her  cardiomyopathy. Schedule pulmonary evaluation for possible sleep apnea. Weight loss is advised. Check digoxin level today.   2. NICM/chronic systolic CHF: She does not appear to be volume overloaded at this time. Continue Toprol XL 100 mg daily and digoxin. Add losartan 25 mg daily. Plan to recheck echo 4 weeks after cardioversion to evaluate for improved LV systolic function. If her EF remains reduced at less than 40% plan for placing on Entresto in place of losartan. Continue Lasix 20 mg daily. Check renal function today.   3. Vision disturbances: Resolved. Does not want workup at this time.   4. Moderate mitral regurgitation: Check echo as above.   Disposition: F/u with me in 5-6 weeks s/p DCCV and follow up echo as above.   Current medicines are reviewed at length with the patient today.  The patient did not have any concerns regarding medicines.  Melvern Banker PA-C 05/16/2016 3:27 PM     [address], [place] [address] [phone]

## 2016-05-26 NOTE — Progress Notes (Signed)
Dr. Fletcher Anon in to talk to pt and daughter. Discharge instructions given to pt and daughter. Follow up plan of care discussed. meds discussed. All questions answered pt states she understands. Discharge instructions and conscious sedation instructions given to pt and daughter. Pt tol procedure well. Home with daughter who is the driver.

## 2016-05-26 NOTE — Transfer of Care (Signed)
Immediate Anesthesia Transfer of Care Note  Patient: Brandy [name]  Procedure(s) Performed: Procedure(s): Cardioversion (N/A)  Patient Location: PACU and Nursing Unit  Anesthesia Type:General  Level of Consciousness: awake, alert  and oriented  Airway & Oxygen Therapy: Patient Spontanous Breathing and Patient connected to nasal cannula oxygen  Post-op Assessment: Report given to RN and Post -op Vital signs reviewed and stable  Post vital signs: Reviewed and stable  Last Vitals:  Vitals:   05/26/16 0758 05/26/16 0759  BP:  115/68  Pulse: 92 95  Resp: (!) 28 (!) 25  Temp:      Last Pain:  Vitals:   05/26/16 0643  TempSrc: Oral  PainSc: 1          Complications: No apparent anesthesia complications

## 2016-05-26 NOTE — Anesthesia Postprocedure Evaluation (Signed)
Anesthesia Post Note  Patient: Brandy [name]  Procedure(s) Performed: Procedure(s) (LRB): Cardioversion (N/A)  Patient location during evaluation: Other Anesthesia Type: General Level of consciousness: awake and alert and oriented Pain management: pain level controlled Vital Signs Assessment: post-procedure vital signs reviewed and stable Respiratory status: spontaneous breathing, nonlabored ventilation and respiratory function stable Cardiovascular status: blood pressure returned to baseline and stable Postop Assessment: no signs of nausea or vomiting Anesthetic complications: no     Last Vitals:  Vitals:   05/26/16 0759 05/26/16 0800  BP: 115/68 106/80  Pulse: 95 88  Resp: (!) 25 (!) 24  Temp:      Last Pain:  Vitals:   05/26/16 0643  TempSrc: Oral  PainSc: 1                  Verta Riedlinger

## 2016-05-30 ENCOUNTER — Other Ambulatory Visit: Payer: Self-pay

## 2016-05-30 ENCOUNTER — Ambulatory Visit (INDEPENDENT_AMBULATORY_CARE_PROVIDER_SITE_OTHER): Payer: BC Managed Care – PPO

## 2016-05-30 DIAGNOSIS — I4819 Other persistent atrial fibrillation: Secondary | ICD-10-CM

## 2016-05-30 DIAGNOSIS — I481 Persistent atrial fibrillation: Secondary | ICD-10-CM

## 2016-06-01 ENCOUNTER — Ambulatory Visit: Payer: BC Managed Care – PPO | Admitting: Internal Medicine

## 2016-06-02 ENCOUNTER — Encounter: Payer: Self-pay | Admitting: Nurse Practitioner

## 2016-06-02 ENCOUNTER — Ambulatory Visit (INDEPENDENT_AMBULATORY_CARE_PROVIDER_SITE_OTHER): Payer: BC Managed Care – PPO | Admitting: Nurse Practitioner

## 2016-06-02 ENCOUNTER — Telehealth: Payer: Self-pay | Admitting: Nurse Practitioner

## 2016-06-02 VITALS — BP 120/60 | HR 106 | Ht 67.5 in | Wt 249.5 lb

## 2016-06-02 DIAGNOSIS — I428 Other cardiomyopathies: Secondary | ICD-10-CM

## 2016-06-02 DIAGNOSIS — I4819 Other persistent atrial fibrillation: Secondary | ICD-10-CM

## 2016-06-02 DIAGNOSIS — I5022 Chronic systolic (congestive) heart failure: Secondary | ICD-10-CM | POA: Diagnosis not present

## 2016-06-02 DIAGNOSIS — I481 Persistent atrial fibrillation: Secondary | ICD-10-CM | POA: Diagnosis not present

## 2016-06-02 NOTE — Telephone Encounter (Signed)
Pt forgot to ask a question when she was here today, she would like to know if it is ok for her to eat grapefruit with the medication she is taking. Please call and advise

## 2016-06-02 NOTE — Patient Instructions (Addendum)
Medication Instructions:  Your physician recommends that you continue on your current medications as directed. Please refer to the Current Medication list given to you today.   Labwork: none  Testing/Procedures: none  Follow-Up: Keep your appointment with Dr. Caryl Comes on February 8 at 11am.  Any Other Special Instructions Will Be Listed Below (If Applicable).     If you need a refill on your cardiac medications before your next appointment, please call your pharmacy.

## 2016-06-02 NOTE — Progress Notes (Signed)
Office Visit    Patient Name: Brandy [name] Date of Encounter: 06/02/2016  Primary Care Provider:  Elgie Collard, MD Primary Cardiologist:  Johnny Bridge, MD   Chief Complaint    61 y/o ? dx with AF and NICM in late 2017, who presents for f/u after recent failed DCCV.  Past Medical History    Past Medical History:  Diagnosis Date  . Chronic systolic CHF (congestive heart failure) ([place])    a. 03/2016 Echo: Ef 15-20%, diff HK, ant AK;  b. 05/2016 Echo: Ef 30-35%, diff HK, mildly dil LA/RA.  [name] Moderate mitral regurgitation    a. 03/2016 Echo: mod MR in setting of LV dysfxn.  [name] NICM (nonischemic cardiomyopathy) ([place])    a. 03/2016 Echo: EF 15-20%, diff HK, ant AK, mod MR, mod dil LA, mildly dil RA;  b. 04/2016 Cath: nl cors;  c. 05/2016 Echo: EF 30-35%, diff HK.  . Obesity   . Persistent atrial fibrillation ([place])    a. Dx 03/2016;  b. CHA2DS2VASc = 2-->Eliquis 5mg  BID;  b. 05/2016 Failed DCCV x 4.   Past Surgical History:  Procedure Laterality Date  . CARDIAC CATHETERIZATION N/A 04/17/2016   Procedure: Left Heart Cath and Coronary Angiography;  Surgeon: [place] Hampshire, MD;  Location: [place] [facility];  Service: Cardiovascular;  Laterality: N/A;  . CHOLECYSTECTOMY    . ELECTROPHYSIOLOGIC STUDY N/A 05/26/2016   Procedure: Cardioversion;  Surgeon: [place] Hampshire, MD;  Location: [facility];  Service: Cardiovascular;  Laterality: N/A;    Allergies  No Known Allergies  History of Present Illness    61 y/o ? with the above PMH.  She was admitted to [facility] in 04/2016 with dyspnea and found to be in Afib with LV dysfxn - initially EF 15-20%.  She was rate controlled on  blocker and digoxin and also placed on eliquis.  She did well following d/c, though did have 2 brief episodes of visual field disturbances, which she did not immediately report.  She f/u in early January and remained in AF and thus she underwent DCCV, which was unsuccessful x 4.  Since then, she has been feeling  reasonably well.  She does tire out easier than she might normally expect but says that if she paces herself, she does quite well.  She tracks her hr's with a fitness band and says that she usually trends in the 70's to mid 80's.  She denies chest pain, palpitations, dyspnea, pnd, orthopnea, n, v, dizziness, syncope, edema, weight gain, or early satiety.  She has f/u with Dr. Caryl Comes on 2/8 to discuss antiarrhythmic options.  This was originally scheduled for yesterday but was rescheduled due to snow/office closures.  Home Medications    Prior to Admission medications   Medication Sig Start Date End Date Taking? Authorizing Provider  apixaban (ELIQUIS) 5 MG TABS tablet Take 1 tablet (5 mg total) by mouth 2 (two) times daily. 05/16/16  Yes Ryan M Dunn, PA-C  Barberry-Oreg Grape-Goldenseal (BERBERINE COMPLEX PO) Take 1 capsule by mouth 2 (two) times daily.   Yes Historical Provider, MD  Cholecalciferol (VITAMIN D3) 5000 units CAPS Take 1 capsule by mouth daily.   Yes Historical Provider, MD  digoxin (LANOXIN) 0.125 MG tablet Take 1 tablet (0.125 mg total) by mouth daily. 05/03/16  Yes Rogelia Mire, NP  furosemide (LASIX) 20 MG tablet Take 1 tablet (20 mg total) by mouth daily. In the afternoon 05/16/16  Yes Ryan M Dunn, PA-C  losartan (COZAAR)  25 MG tablet Take 1 tablet (25 mg total) by mouth daily. 05/16/16 08/14/16 Yes Ryan M Dunn, PA-C  metoprolol succinate (TOPROL-XL) 100 MG 24 hr tablet Take 1 tablet (100 mg total) by mouth daily. Take with or immediately following a meal. 05/01/16 07/30/16 Yes Rogelia Mire, NP    Review of Systems    Tires more easily than she might normally expect but overall doing well.  She denies chest pain, palpitations, dyspnea, pnd, orthopnea, n, v, dizziness, syncope, edema, weight gain, or early satiety.  All other systems reviewed and are otherwise negative except as noted above.  Physical Exam    VS:  BP 120/60 (BP Location: Left Arm, Patient Position: Sitting,  Cuff Size: Large)   Pulse (!) 106   Ht 5' 7.5" (1.715 m)   Wt 249 lb 8 oz (113.2 kg)   BMI 38.50 kg/m  , BMI Body mass index is 38.5 kg/m. GEN: Well nourished, well developed, in no acute distress.  HEENT: normal.  Neck: Supple, no JVD, carotid bruits, or masses. Cardiac: IR, IR, no murmurs, rubs, or gallops. No clubbing, cyanosis, edema.  Radials/DP/PT 2+ and equal bilaterally.  Respiratory:  Respirations regular and unlabored, clear to auscultation bilaterally. GI: Soft, nontender, nondistended, BS + x 4. MS: no deformity or atrophy. Skin: warm and dry, no rash. Neuro:  Strength and sensation are intact. Psych: Normal affect.  Accessory Clinical Findings    ECG - afib, 106, pvc, non-specific st/t changes.  Assessment & Plan    1.  Persistent Afib:  First dx in 03/2016 with unsuccessful DCCV earlier this month.  Rates have been in the 70's to 80's @ home.  HR today 106, which she says is unusual.  BP 120/60.  We discussed possibly titrating  blocker but in light of nl HRs @ home, we will stick with current dose and if she notes higher trends @ home, she will contact us, @ which point, we can move to toprol xl 150 mg daily.  Cont eliquis (CHA2DS2VASc =   2 ).  F/u EP as scheduled to discuss antiarrhythmic options.  LV dysfxn makes her a poor candidate for flecainide, sotalol, dronedarone.  Youth makes amio poor choice.  QTc is nl.  May do well w/ tikosyn.  2.  NICM/chronic systolic chf:  Nl cors on cath in November. Euvolemic on exam.  Cont  blocker, arb, digoxin, and lasix.  EF improved some with rate control (30-35% by recent echo).  Would plan to f/u echo once she is in sinus rhythm.  3.  Mod MR:  Noted on echo in Nov.  Triv MR on f/u echo 1/16.  4.  Dispo:  F/u with Dr. Caryl Comes on 2/8 as scheduled.     Murray Hodgkins, NP 06/02/2016, 4:53 PM

## 2016-06-02 NOTE — Telephone Encounter (Signed)
Pt inquired if there is an interaction w/grapefruit and her cardiac medications. Ignacia Bayley, NP reviewed medication list and states it is acceptable to eat grapefruit. Reviewed w/pt who verbalized understanding.

## 2016-06-12 ENCOUNTER — Telehealth: Payer: Self-pay | Admitting: Cardiovascular Disease

## 2016-06-12 NOTE — Telephone Encounter (Signed)
Pt reports she read berberine could decrease effectiveness of losartan and questions if she should take both.   Losartan prescribed @ Jan 2 OV for CHF. Plans were to recheck EF after DCCV and switch to entresto if EF<40%. DCCV  unsuccessful x 4. Pt has Feb 8 appt w/Dr. Caryl Comes to discuss antiarrythmic.  Recent echo 30-35% w/plans to recheck once she is in NSR.  Advised pt to continue all medications as instructed and will forward to Christell Faith, PA-C to further advise.

## 2016-06-12 NOTE — Telephone Encounter (Signed)
Pt has a question regarding her Losartin. She wants to make sure she is going to continue to take this before she refills it. She states she has done some research on her Berberine, and saw that it affects the effectiveness of Losartan. Asks if she should keep taking the Berberine.   Please call.

## 2016-06-13 NOTE — Telephone Encounter (Signed)
Would recommend discussing this with her pharmacist.

## 2016-06-13 NOTE — Telephone Encounter (Signed)
Left detailed message w/CB number if questions on pt cell VM

## 2016-06-22 ENCOUNTER — Telehealth: Payer: Self-pay | Admitting: Pharmacist

## 2016-06-22 ENCOUNTER — Encounter: Payer: Self-pay | Admitting: Internal Medicine

## 2016-06-22 ENCOUNTER — Ambulatory Visit (INDEPENDENT_AMBULATORY_CARE_PROVIDER_SITE_OTHER): Payer: BC Managed Care – PPO | Admitting: Internal Medicine

## 2016-06-22 VITALS — BP 136/79 | HR 103 | Ht 67.5 in | Wt 247.8 lb

## 2016-06-22 DIAGNOSIS — I428 Other cardiomyopathies: Secondary | ICD-10-CM | POA: Diagnosis not present

## 2016-06-22 DIAGNOSIS — I481 Persistent atrial fibrillation: Secondary | ICD-10-CM | POA: Diagnosis not present

## 2016-06-22 DIAGNOSIS — I4819 Other persistent atrial fibrillation: Secondary | ICD-10-CM

## 2016-06-22 DIAGNOSIS — I5022 Chronic systolic (congestive) heart failure: Secondary | ICD-10-CM | POA: Diagnosis not present

## 2016-06-22 MED ORDER — METOPROLOL TARTRATE 50 MG PO TABS: ORAL_TABLET | ORAL | Status: DC

## 2016-06-22 NOTE — Progress Notes (Signed)
ELECTROPHYSIOLOGY CONSULT NOTE  Patient ID: Brandy [name], MRN: [id number], DOB/AGE: Jun 07, 1955 61 y.o. Admit date: (Not on file) Date of Consult: 06/22/2016  Primary Physician: Elgie Collard, MD Primary Cardiologist: MA Consulting Physician ma  Chief Complaint: aTRIAL FIBRILLATION   HPI Brandy [name] is a 61 y.o. female  Referred because of persistent atrial fibrillation.  This came to attention 11/17 when she presented with acute systolic heart failure atrial fibrillation and an ejection fraction of 20%. She was rate controlled with beta blockers and digoxin. She was treated with anticoagulation and underwent cardioversion which was unsuccessful.  Follow-up echocardiogram 1/18 demonstrated interval improvement  DATE TEST    12/17 Cath EF not measured Normal CAs   12/17    Echo   EF 20-25 %   1/18    Echo   EF 30-35 % L a E (44/1.86/30)        \  CHADS-VASc score 2 (gender-1 heart failure-1)  She's had no palpitations.  Functional status remains modestly impaired. She does not have peripheral edema orthopnea; mostly dyspnea on exertion   Past Medical History:  Diagnosis Date  . Chronic systolic CHF (congestive heart failure) ([place])    a. 03/2016 Echo: Ef 15-20%, diff HK, ant AK;  b. 05/2016 Echo: Ef 30-35%, diff HK, mildly dil LA/RA.  [name] Moderate mitral regurgitation    a. 03/2016 Echo: mod MR in setting of LV dysfxn.  [name] NICM (nonischemic cardiomyopathy) ([place])    a. 03/2016 Echo: EF 15-20%, diff HK, ant AK, mod MR, mod dil LA, mildly dil RA;  b. 04/2016 Cath: nl cors;  c. 05/2016 Echo: EF 30-35%, diff HK.  . Obesity   . Persistent atrial fibrillation ([place])    a. Dx 03/2016;  b. CHA2DS2VASc = 2-->Eliquis 5mg  BID;  b. 05/2016 Failed DCCV x 4.      Surgical History:  Past Surgical History:  Procedure Laterality Date  . CARDIAC CATHETERIZATION N/A 04/17/2016   Procedure: Left Heart Cath and Coronary Angiography;  Surgeon: Wellington Hampshire, MD;  Location: [place]  [facility];  Service: Cardiovascular;  Laterality: N/A;  . CHOLECYSTECTOMY    . ELECTROPHYSIOLOGIC STUDY N/A 05/26/2016   Procedure: Cardioversion;  Surgeon: Wellington Hampshire, MD;  Location: [facility] ORS;  Service: Cardiovascular;  Laterality: N/A;     Home Meds: Prior to Admission medications   Medication Sig Start Date End Date Taking? Authorizing Provider  apixaban (ELIQUIS) 5 MG TABS tablet Take 1 tablet (5 mg total) by mouth 2 (two) times daily. 05/16/16  Yes Ryan M Dunn, PA-C  Cholecalciferol (VITAMIN D3) 5000 units CAPS Take 1 capsule by mouth daily.   Yes Historical Provider, MD  digoxin (LANOXIN) 0.125 MG tablet Take 1 tablet (0.125 mg total) by mouth daily. 05/03/16  Yes Rogelia Mire, NP  furosemide (LASIX) 20 MG tablet Take 1 tablet (20 mg total) by mouth daily. In the afternoon 05/16/16  Yes Ryan M Dunn, PA-C  losartan (COZAAR) 25 MG tablet Take 1 tablet (25 mg total) by mouth daily. 05/16/16 08/14/16 Yes Ryan M Dunn, PA-C  metoprolol succinate (TOPROL-XL) 100 MG 24 hr tablet Take 1 tablet (100 mg total) by mouth daily. Take with or immediately following a meal. 05/01/16 07/30/16 Yes Rogelia Mire, NP  vitamin C (ASCORBIC ACID) 500 MG tablet Take 500 mg by mouth daily.   Yes Historical Provider, MD    Allergies: No Known Allergies  Social History   Social History  .  Marital status: Widowed    Spouse name: N/A  . Number of children: N/A  . Years of education: N/A   Occupational History  . Not on file.   Social History Main Topics  . Smoking status: Never Smoker  . Smokeless tobacco: Never Used  . Alcohol use No  . Drug use: No  . Sexual activity: Not on file   Other Topics Concern  . Not on file   Social History Narrative  . No narrative on file     Family History  Problem Relation Age of Onset  . COPD Mother   . COPD Father   . Heart disease Father   . Diabetes Neg Hx      ROS:  Please see the history of present illness.     All other systems reviewed and  negative.    Physical Exam: Blood pressure 136/79, pulse (!) 103, height 5' 7.5" (1.715 m), weight 247 lb 12 oz (112.4 kg). General: Well developed, well nourished female in no acute distress. Head: Normocephalic, atraumatic, sclera non-icteric, no xanthomas, nares are without discharge. EENT: normal  Lymph Nodes:  none Neck: Negative for carotid bruits. JVD not elevated. Back:without scoliosis kyphosis  Lungs: Clear bilaterally to auscultation without wheezes, rales, or rhonchi. Breathing is unlabored. Heart: Irregularly irregular rate and rhythm with normal S1 S2.  2/6 systolic  murmur . With RR interval. No rubs, or gallops appreciated. Abdomen: Soft, non-tender, non-distended with normoactive bowel sounds. No hepatomegaly. No rebound/guarding. No obvious abdominal masses. Msk:  Strength and tone appear normal for age. Extremities: No clubbing or cyanosis. No edema.  Distal pedal pulses are 2+ and equal bilaterally. Skin: Warm and Dry Neuro: Alert and oriented X 3. CN III-XII intact Grossly normal sensory and motor function . Psych:  Responds to questions appropriately with a normal affect.      Labs: Cardiac Enzymes No results for input(s): CKTOTAL, CKMB, TROPONINI in the last 72 hours. CBC Lab Results  Component Value Date   WBC 9.8 05/16/2016   HGB 16.1 (H) 04/18/2016   HCT 44.0 05/16/2016   MCV 88 05/16/2016   PLT 258 05/16/2016   PROTIME: No results for input(s): LABPROT, INR in the last 72 hours. Chemistry No results for input(s): NA, K, CL, CO2, BUN, CREATININE, CALCIUM, PROT, BILITOT, ALKPHOS, ALT, AST, GLUCOSE in the last 168 hours.  Invalid input(s): LABALBU Lipids No results found for: CHOL, HDL, LDLCALC, TRIG BNP No results found for: PROBNP Thyroid Function Tests: No results for input(s): TSH, T4TOTAL, T3FREE, THYROIDAB in the last 72 hours.  Invalid input(s): FREET3 Miscellaneous No results found for: DDIMER  Radiology/Studies:  No results  found.  EKG: Atrial fibrillation at 103 Intervals-/08/32   Assessment and Plan:  Atrial fibrillation with poorly contolled rates  Cardiomyopathy-- nonischemic prob rate related  CHF chronic systolic  Stage 2   Pt was unable to hold sinus, reviewed with Dr MA  Given the albeit brief restoration of sinus with DCCV, and ongoing CHF and cardiomyopathy in her young age, I would recommend cardioversion   If this is insufficient, would recommend for RFCA for Afib Will use dofetilide, have reviewed risks and benefits.  She is agreeable    Further, rates are uncontrolled and BP allows uptitration  Add metoprolol tartrate (as at home) 50 q am   Will continue current meds   Long term anticoagulation is appropriate      Virl Axe

## 2016-06-22 NOTE — Patient Instructions (Signed)
Medication Instructions: - Your physician has recommended you make the following change in your medication:  1) resume metoprolol tartrate 50 mg- take one tablet every morning with your long acting metoprolol.  Labwork: - none ordered  Procedures/Testing: - Please talk with you PCP about the possibility of arranging a sleep study for you  Follow-Up: - pending Tikosyn loading- please call Dr. Olin Pia nurse, Nira Conn, when you have some dates in mind for this.   Any Additional Special Instructions Will Be Listed Below (If Applicable).     If you need a refill on your cardiac medications before your next appointment, please call your pharmacy.

## 2016-06-22 NOTE — Telephone Encounter (Signed)
Called pt to schedule Tikosyn admit. She missed 1 dose of Eliquis 1 week ago. Earliest pt can come in while having 3 weeks of therapeutic anticoagulation is on March 12th. She will not miss any doses in the next month. No other contraindicating meds. Advised pt to call her pharmacy to make sure copay is affordable.

## 2016-06-23 ENCOUNTER — Emergency Department: Payer: BC Managed Care – PPO

## 2016-06-23 ENCOUNTER — Emergency Department
Admission: EM | Admit: 2016-06-23 | Discharge: 2016-06-23 | Disposition: A | Discharge status: Home / Self Care | Payer: BC Managed Care – PPO | Attending: Emergency Medicine | Admitting: Emergency Medicine

## 2016-06-23 ENCOUNTER — Encounter: Payer: Self-pay | Admitting: Emergency Medicine

## 2016-06-23 ENCOUNTER — Telehealth: Payer: Self-pay | Admitting: Internal Medicine

## 2016-06-23 ENCOUNTER — Ambulatory Visit (INDEPENDENT_AMBULATORY_CARE_PROVIDER_SITE_OTHER)
Admission: EM | Admit: 2016-06-23 | Discharge: 2016-06-23 | Disposition: A | Discharge status: Other Acute Inpatient Hospital | Payer: BC Managed Care – PPO | Source: Home / Self Care | Attending: Family Medicine | Admitting: Family Medicine

## 2016-06-23 DIAGNOSIS — Z79899 Other long term (current) drug therapy: Secondary | ICD-10-CM

## 2016-06-23 DIAGNOSIS — Z7901 Long term (current) use of anticoagulants: Secondary | ICD-10-CM

## 2016-06-23 DIAGNOSIS — J111 Influenza due to unidentified influenza virus with other respiratory manifestations: Secondary | ICD-10-CM | POA: Diagnosis not present

## 2016-06-23 DIAGNOSIS — I4891 Unspecified atrial fibrillation: Secondary | ICD-10-CM | POA: Diagnosis not present

## 2016-06-23 DIAGNOSIS — R0981 Nasal congestion: Secondary | ICD-10-CM | POA: Insufficient documentation

## 2016-06-23 DIAGNOSIS — R6889 Other general symptoms and signs: Secondary | ICD-10-CM

## 2016-06-23 DIAGNOSIS — I5022 Chronic systolic (congestive) heart failure: Secondary | ICD-10-CM | POA: Insufficient documentation

## 2016-06-23 DIAGNOSIS — I481 Persistent atrial fibrillation: Secondary | ICD-10-CM

## 2016-06-23 DIAGNOSIS — R05 Cough: Secondary | ICD-10-CM | POA: Insufficient documentation

## 2016-06-23 DIAGNOSIS — R509 Fever, unspecified: Secondary | ICD-10-CM | POA: Insufficient documentation

## 2016-06-23 LAB — CBC
HCT: 42 % (ref 35.0–47.0)
Hemoglobin: 14.5 g/dL (ref 12.0–16.0)
MCH: 30.1 pg (ref 26.0–34.0)
MCHC: 34.5 g/dL (ref 32.0–36.0)
MCV: 87.2 fL (ref 80.0–100.0)
Platelets: 279 10*3/uL (ref 150–440)
RBC: 4.82 MIL/uL (ref 3.80–5.20)
RDW: 13.6 % (ref 11.5–14.5)
WBC: 15.6 10*3/uL — ABNORMAL HIGH (ref 3.6–11.0)

## 2016-06-23 LAB — BASIC METABOLIC PANEL
Anion gap: 7 (ref 5–15)
BUN: 12 mg/dL (ref 6–20)
CO2: 26 mmol/L (ref 22–32)
Calcium: 9.2 mg/dL (ref 8.9–10.3)
Chloride: 102 mmol/L (ref 101–111)
Creatinine, Ser: 0.89 mg/dL (ref 0.44–1.00)
GFR calc Af Amer: >60 mL/min (ref >60)
GFR calc non Af Amer: >60 mL/min (ref >60)
Glucose, Bld: 133 mg/dL — ABNORMAL HIGH (ref 65–99)
Potassium: 3.7 mmol/L (ref 3.5–5.1)
Sodium: 135 mmol/L (ref 135–145)

## 2016-06-23 LAB — DIGOXIN LEVEL: Digoxin Level: 0.7 ng/mL — ABNORMAL LOW (ref 0.8–2.0)

## 2016-06-23 MED ORDER — SODIUM CHLORIDE 0.9 % IV BOLUS (SEPSIS)
500.0000 mL | Freq: Once | INTRAVENOUS | Status: AC
  Administered 2016-06-23: 500 mL via INTRAVENOUS

## 2016-06-23 MED ORDER — SODIUM CHLORIDE 0.9 % IV SOLN
Freq: Once | INTRAVENOUS | Status: AC
  Administered 2016-06-23: 23:00:00 via INTRAVENOUS

## 2016-06-23 MED ORDER — ACETAMINOPHEN 325 MG PO TABS
650.0000 mg | ORAL_TABLET | Freq: Once | ORAL | Status: AC
  Administered 2016-06-23: 650 mg via ORAL
  Filled 2016-06-23: qty 2

## 2016-06-23 NOTE — ED Provider Notes (Addendum)
[facility] Emergency Department Provider Note  ____________________________________________   I have reviewed the triage vital signs and the nursing notes.   HISTORY  Chief Complaint Irregular Heart Beat and Flu like symptoms    HPI Brandy [name] is a 61 y.o. female presents today complaining of flu symptoms. She has been on multiple different people with the flu. She has runny nose, cough, slight sore throat. Patient states that she has not yet taken her evening medications. She is on Lasix and beta blocker as well as a half dose of diltiazem. Patient does have a history of atrial fibrillation and is on anticoagulation for that. She has a history of nonischemic myopathy with an EF of approximately 30-35% as of January. HEENT states that she went to [facility] with her flu symptoms and was sent here because her heart rate was 118 or so. Patient has no chest pain or shortness of breath. She does as noted have a mild cough. Rhinorrhea, fever etc. Symptoms started last night and got worse today.      Past Medical History:  Diagnosis Date  . Chronic systolic CHF (congestive heart failure) ([place])    a. 03/2016 Echo: Ef 15-20%, diff HK, ant AK;  b. 05/2016 Echo: Ef 30-35%, diff HK, mildly dil LA/RA.  [name] Moderate mitral regurgitation    a. 03/2016 Echo: mod MR in setting of LV dysfxn.  [name] NICM (nonischemic cardiomyopathy) ([place])    a. 03/2016 Echo: EF 15-20%, diff HK, ant AK, mod MR, mod dil LA, mildly dil RA;  b. 04/2016 Cath: nl cors;  c. 05/2016 Echo: EF 30-35%, diff HK.  . Obesity   . Persistent atrial fibrillation ([place])    a. Dx 03/2016;  b. CHA2DS2VASc = 2-->Eliquis 5mg  BID;  b. 05/2016 Failed DCCV x 4.    Patient Active Problem List   Diagnosis Date Noted  . Persistent atrial fibrillation ([place])   . Atrial fibrillation with RVR ([place])   . SOB (shortness of breath)   . Acute systolic CHF (congestive heart failure) ([place]) 04/14/2016  . Nonischemic cardiomyopathy  ([place]) 04/14/2016  . Morbid obesity ([place]) 04/14/2016  . Acute respiratory distress 04/13/2016  . Pulmonary edema 04/13/2016  . Bronchitis 04/13/2016  . New onset atrial fibrillation ([place]) 04/12/2016    Past Surgical History:  Procedure Laterality Date  . CARDIAC CATHETERIZATION N/A 04/17/2016   Procedure: Left Heart Cath and Coronary Angiography;  Surgeon: Wellington Hampshire, MD;  Location: [place] [facility];  Service: Cardiovascular;  Laterality: N/A;  . CHOLECYSTECTOMY    . ELECTROPHYSIOLOGIC STUDY N/A 05/26/2016   Procedure: Cardioversion;  Surgeon: Wellington Hampshire, MD;  Location: [facility] ORS;  Service: Cardiovascular;  Laterality: N/A;    Prior to Admission medications   Medication Sig Start Date End Date Taking? Authorizing Provider  apixaban (ELIQUIS) 5 MG TABS tablet Take 1 tablet (5 mg total) by mouth 2 (two) times daily. 05/16/16   Rise Mu, PA-C  Cholecalciferol (VITAMIN D3) 5000 units CAPS Take 1 capsule by mouth daily.    Historical Provider, MD  digoxin (LANOXIN) 0.125 MG tablet Take 1 tablet (0.125 mg total) by mouth daily. 05/03/16   Rogelia Mire, NP  furosemide (LASIX) 20 MG tablet Take 1 tablet (20 mg total) by mouth daily. In the afternoon 05/16/16   Areta Haber Dunn, PA-C  losartan (COZAAR) 25 MG tablet Take 1 tablet (25 mg total) by mouth daily. 05/16/16 08/14/16  Areta Haber Dunn, PA-C  metoprolol (LOPRESSOR) 50  MG tablet Take one tablet (50 mg) by mouth once daily with your dose of long acting metoprolol 06/22/16   Deboraha Sprang, MD  vitamin C (ASCORBIC ACID) 500 MG tablet Take 500 mg by mouth daily.    Historical Provider, MD    Allergies Patient has no known allergies.  Family History  Problem Relation Age of Onset  . COPD Mother   . COPD Father   . Heart disease Father   . Diabetes Neg Hx     Social History Social History  Substance Use Topics  . Smoking status: Never Smoker  . Smokeless tobacco: Never Used  . Alcohol use No    Review of  Systems Constitutional: Positive fever/chills Eyes: No visual changes. ENT: Positive sore throat. No stiff neck no neck pain Cardiovascular: Denies chest pain. Respiratory: Denies shortness of breath. Positive cough Gastrointestinal:   no vomiting.  No diarrhea.  No constipation. Genitourinary: Negative for dysuria. Musculoskeletal: Negative lower extremity swelling Skin: Negative for rash. Neurological: Negative for severe headaches, focal weakness or numbness. 10-point ROS otherwise negative.  ____________________________________________   PHYSICAL EXAM:  VITAL SIGNS: ED Triage Vitals [06/23/16 1706]  Enc Vitals Group     BP 124/74     Pulse Rate (!) 108     Resp 18     Temp 100 F (37.8 C)     Temp Source Oral     SpO2 100 %     Weight 245 lb (111.1 kg)     Height 5\' 8"  (1.727 m)     Head Circumference      Peak Flow      Pain Score 8     Pain Loc      Pain Edu?      Excl. in [place]?     Constitutional: Alert and oriented. Well appearing and in no acute distress.Patient looks as if she has the flu but she doesn't look medically toxic Eyes: Conjunctivae are normal. PERRL. EOMI. Head: Atraumatic. Nose: No congestion/rhinnorhea. Mouth/Throat: Mucous membranes are moist.  Oropharynx non-erythematous. Neck: No stridor.   Nontender with no meningismus Cardiovascular: Regularly irregular, mild tachycardia noted rate 114. Grossly normal heart sounds.  Good peripheral circulation. Respiratory: Normal respiratory effort.  No retractions. Lungs CTAB. Abdominal: Soft and nontender. No distention. No guarding no rebound Back:  There is no focal tenderness or step off.  there is no midline tenderness there are no lesions noted. there is no CVA tenderness Musculoskeletal: No lower extremity tenderness, no upper extremity tenderness. No joint effusions, no DVT signs strong distal pulses no edema Neurologic:  Normal speech and language. No gross focal neurologic deficits are  appreciated.  Skin:  Skin is warm, dry and intact. No rash noted. Psychiatric: Mood and affect are normal. Speech and behavior are normal.  ____________________________________________   LABS (all labs ordered are listed, but only abnormal results are displayed)  Labs Reviewed  BASIC METABOLIC PANEL - Abnormal; Notable for the following:       Result Value   Glucose, Bld 133 (*)    All other components within normal limits  CBC - Abnormal; Notable for the following:    WBC 15.6 (*)    All other components within normal limits  DIGOXIN LEVEL - Abnormal; Notable for the following:    Digoxin Level 0.7 (*)    All other components within normal limits   ____________________________________________  EKG  I personally interpreted any EKGs ordered by me or triage A fibrillation rate 105  no acute ST elevation or depression is having ST changes ____________________________________________  RADIOLOGY  I reviewed any imaging ordered by me or triage that were performed during my shift and, if possible, patient and/or family made aware of any abnormal findings. ____________________________________________   PROCEDURES  Procedure(s) performed: None  Procedures  Critical Care performed: None  ____________________________________________   INITIAL IMPRESSION / ASSESSMENT AND PLAN / ED COURSE  Pertinent labs & imaging results that were available during my care of the patient were reviewed by me and considered in my medical decision making (see chart for details).  Patient with a known history of atrial fibrillation presents with mild tachycardia in the context of not having taken her blood pressure medications yet seeding and what appears clearly to be the flu with multiple different patients contacts and very large community burden of influenza. No evidence of acute bacterial infection such as strep, pneumonia, etc. Chest x-ray and blood work are reassuring. White count is elevated  as expected. Patient does not appear to be septic. We are going to give her ginger IV fluids in response to her elevated heart rate which is again multifactorial likely reactive. No evidence of a primary cardiac pathology today. Digoxin level is 107 but she takes a low dose, certainly nontoxic. It is my hope that we can get her feeling better with IV fluids. Patient would prefer to go home. We will start with a half liter and reassess. Chasing her heart rate around in this context and don't think is going to be of any great benefit to her depending on how fast it is and what her pressures are  ----------------------------------------- 10:52 PM on 06/23/2016 -----------------------------------------  Rate is 95 patient feels much better after IV fluid, eager to go home. Does likely have the flu. . No evidence of other acute pathology with runny nose, cough, fever, body aches etc. Is much better after IV fluid. Have encouraged return precautions and follow-up and advised her to take plenty of fluids at home.    ____________________________________________   FINAL CLINICAL IMPRESSION(S) / ED DIAGNOSES  Final diagnoses:  None      This chart was dictated using voice recognition software.  Despite best efforts to proofread,  errors can occur which can change meaning.      Schuyler Amor, MD 06/23/16 2114    Schuyler Amor, MD 06/23/16 [phone]

## 2016-06-23 NOTE — Telephone Encounter (Signed)
Spoke with patient and let her know that she may want to check with her primary care provider based on her symptoms. Let her know that since she is on blood pressure medications she could try over the counter coricidin HBP or generic equivalent since these are designed for patients with blood pressure problems. She was appreciative for the call back and had no further questions at this time.

## 2016-06-23 NOTE — ED Provider Notes (Signed)
CSN: [id number]     Arrival date & time 06/23/16  1549 History   First MD Initiated Contact with Patient 06/23/16 1610     Chief Complaint  Patient presents with  . Generalized Body Aches  . Cough  . Nasal Congestion   (Consider location/radiation/quality/duration/timing/severity/associated sxs/prior Treatment) Pt in for flu like sx. States that she has cough, congestion, fever, generalized body aches since yesterday. Has not taken anything today. Pt was seen at cardiology office yesterday for an echo. Has a significant hx of a fib and having cardioversion also a heart cath. Denies any current chest pain.       Past Medical History:  Diagnosis Date  . Chronic systolic CHF (congestive heart failure) ([place])    a. 03/2016 Echo: Ef 15-20%, diff HK, ant AK;  b. 05/2016 Echo: Ef 30-35%, diff HK, mildly dil LA/RA.  [name] Moderate mitral regurgitation    a. 03/2016 Echo: mod MR in setting of LV dysfxn.  [name] NICM (nonischemic cardiomyopathy) ([place])    a. 03/2016 Echo: EF 15-20%, diff HK, ant AK, mod MR, mod dil LA, mildly dil RA;  b. 04/2016 Cath: nl cors;  c. 05/2016 Echo: EF 30-35%, diff HK.  . Obesity   . Persistent atrial fibrillation ([place])    a. Dx 03/2016;  b. CHA2DS2VASc = 2-->Eliquis 5mg  BID;  b. 05/2016 Failed DCCV x 4.   Past Surgical History:  Procedure Laterality Date  . CARDIAC CATHETERIZATION N/A 04/17/2016   Procedure: Left Heart Cath and Coronary Angiography;  Surgeon: Wellington Hampshire, MD;  Location: [place] [facility];  Service: Cardiovascular;  Laterality: N/A;  . CHOLECYSTECTOMY    . ELECTROPHYSIOLOGIC STUDY N/A 05/26/2016   Procedure: Cardioversion;  Surgeon: Wellington Hampshire, MD;  Location: [facility] ORS;  Service: Cardiovascular;  Laterality: N/A;   Family History  Problem Relation Age of Onset  . COPD Mother   . COPD Father   . Heart disease Father   . Diabetes Neg Hx    Social History  Substance Use Topics  . Smoking status: Never Smoker  . Smokeless tobacco: Never Used   . Alcohol use No   OB History    No data available     Review of Systems  Constitutional: Positive for appetite change, chills, fatigue and fever.  HENT: Positive for postnasal drip, rhinorrhea, sneezing and sore throat.   Respiratory: Positive for cough and shortness of breath.   Cardiovascular: Negative.   Gastrointestinal: Positive for nausea.  Genitourinary: Negative.   Musculoskeletal:       Body ache  Skin: Negative.   Neurological: Positive for weakness.    Allergies  Patient has no known allergies.  Home Medications   Prior to Admission medications   Medication Sig Start Date End Date Taking? Authorizing Provider  apixaban (ELIQUIS) 5 MG TABS tablet Take 1 tablet (5 mg total) by mouth 2 (two) times daily. 05/16/16   Rise Mu, PA-C  Cholecalciferol (VITAMIN D3) 5000 units CAPS Take 1 capsule by mouth daily.    Historical Provider, MD  digoxin (LANOXIN) 0.125 MG tablet Take 1 tablet (0.125 mg total) by mouth daily. 05/03/16   Rogelia Mire, NP  furosemide (LASIX) 20 MG tablet Take 1 tablet (20 mg total) by mouth daily. In the afternoon 05/16/16   Areta Haber Dunn, PA-C  losartan (COZAAR) 25 MG tablet Take 1 tablet (25 mg total) by mouth daily. 05/16/16 08/14/16  Areta Haber Dunn, PA-C  metoprolol (LOPRESSOR) 50 MG tablet Take one  tablet (50 mg) by mouth once daily with your dose of long acting metoprolol 06/22/16   Deboraha Sprang, MD  vitamin C (ASCORBIC ACID) 500 MG tablet Take 500 mg by mouth daily.    Historical Provider, MD   Meds Ordered and Administered this Visit  Medications - No data to display  BP 132/66 (BP Location: Left Arm)   Pulse (!) 118   Temp 99.8 F (37.7 C) (Oral)   Resp 16   Ht 5' 7.5" (1.715 m)   Wt 247 lb (112 kg)   SpO2 100%   BMI 38.11 kg/m  No data found.   Physical Exam  Constitutional: She is oriented to person, place, and time. She appears well-developed.  HENT:  Head: Normocephalic.  Right Ear: External ear normal.  Left Ear: External  ear normal.  Post nasal drip   Eyes: Pupils are equal, round, and reactive to light.  Neck: Normal range of motion.  Cardiovascular:  Irregular rate, manual 120,   Pulmonary/Chest: Effort normal.  Abdominal: Soft. Bowel sounds are normal.  Musculoskeletal: Normal range of motion.  Neurological: She is alert and oriented to person, place, and time.  Skin: Skin is warm. Capillary refill takes less than 2 seconds.    Urgent Care Course     .EKG Date/Time: 06/23/2016 4:42 PM Performed by: Morley Kos A Authorized by: Frederich Cha   ECG reviewed by ED Physician in the absence of a cardiologist: yes   Previous ECG:    Previous ECG:  Compared to current   Similarity:  Changes noted Interpretation:    Interpretation: abnormal   Rate:    ECG rate:  118   ECG rate assessment: tachycardic   Rhythm:    Rhythm: atrial fibrillation   Ectopy:    Ectopy: none   Comments:     Reviewed previous charts and ekg of pt.    (including critical care time)  Labs Review Labs Reviewed - No data to display  Imaging Review No results found.           MDM   1. Flu-like symptoms   2. Atrial fibrillation with rapid ventricular response (HCC)    Go to the ER, offered to call transport pt wants to drive herself. Educated the risk of driving. Pt ekg shown a fib rvr needs to be evaluated and seen by cardiology Discussed pt with md wade and verified treatment  Reviewed previous charts and ekg of pt    Melanee Left, NP 06/23/16 1641    Melanee Left, NP 06/23/16 1644

## 2016-06-23 NOTE — Telephone Encounter (Signed)
Pt states she has a sore throat, neck ache, joints hurt, and is very cold, runny nose. She would like to know what she can take. Please advise.

## 2016-06-23 NOTE — ED Triage Notes (Signed)
Patient c/o bodyaches, chills, runny nose, and cough that started this morning.

## 2016-06-23 NOTE — ED Notes (Signed)
Patient has a hx of a-fib. Patient noticed last night throat was a little scratchy, this morning throat was worse, drank some hot tea. Patient overall has generalized body aches, scratchy throat, cough with yellow sputum. Patient was sent here from [facility] walk in due to her a-fib and possible flu.

## 2016-06-23 NOTE — Discharge Instructions (Signed)
Go to the ER, offered to call transport pt wants to drive herself. Educated the risk of driving.

## 2016-06-23 NOTE — ED Triage Notes (Signed)
Pt reports flu like sx, sore throat. Pt sent from urgent care for irregular HB. Pt hx of irregular HB, sees Dr. Fletcher Anon. Pt taking digoxin and eliquis. Denies CP or SOB. Pt alert and oriented X4, active, cooperative, pt in NAD. RR even and unlabored, color WNL.

## 2016-06-26 ENCOUNTER — Other Ambulatory Visit: Payer: BC Managed Care – PPO

## 2016-06-26 ENCOUNTER — Telehealth: Payer: Self-pay | Admitting: Cardiovascular Disease

## 2016-06-26 NOTE — Telephone Encounter (Signed)
Spoke with patient and let her know that heart rate can fluctuate especially when she is sick and not feeling well. Instructed her to monitor her blood pressure and heart rate and that hopefully as she starts to feel better that it will stabilize. She verbalized understanding of our conversation, agreement with plan, and has no further questions at this time.

## 2016-06-26 NOTE — Telephone Encounter (Signed)
Pt calling stating she had the flu over the weekend Her HR was 105  She was then sent to ER  She would like to know since she is a bit better now But is wanting to make sure if there is not anything she needs to do  Since her HR shot up that high Please advise

## 2016-07-03 ENCOUNTER — Ambulatory Visit: Payer: BC Managed Care – PPO | Admitting: Cardiovascular Disease

## 2016-07-11 ENCOUNTER — Ambulatory Visit: Payer: BC Managed Care – PPO

## 2016-07-24 ENCOUNTER — Inpatient Hospital Stay (HOSPITAL_COMMUNITY)
Admission: AD | Admit: 2016-07-24 | Discharge: 2016-07-27 | DRG: 309 | Disposition: A | Discharge status: Home / Self Care | Payer: BC Managed Care – PPO | Source: Ambulatory Visit | Attending: Internal Medicine | Admitting: Internal Medicine

## 2016-07-24 ENCOUNTER — Ambulatory Visit (HOSPITAL_BASED_OUTPATIENT_CLINIC_OR_DEPARTMENT_OTHER)
Admission: RE | Admit: 2016-07-24 | Discharge: 2016-07-24 | Disposition: A | Discharge status: Home / Self Care | Payer: BC Managed Care – PPO | Source: Ambulatory Visit | Attending: Nurse Practitioner | Admitting: Nurse Practitioner

## 2016-07-24 ENCOUNTER — Ambulatory Visit: Payer: BC Managed Care – PPO

## 2016-07-24 ENCOUNTER — Encounter (HOSPITAL_COMMUNITY): Payer: Self-pay | Admitting: Nurse Practitioner

## 2016-07-24 ENCOUNTER — Other Ambulatory Visit: Payer: Self-pay

## 2016-07-24 ENCOUNTER — Encounter (HOSPITAL_COMMUNITY): Payer: Self-pay | Admitting: General Practice

## 2016-07-24 VITALS — BP 110/68 | HR 88 | Ht 68.0 in | Wt 242.0 lb

## 2016-07-24 DIAGNOSIS — I5042 Chronic combined systolic (congestive) and diastolic (congestive) heart failure: Secondary | ICD-10-CM | POA: Diagnosis present

## 2016-07-24 DIAGNOSIS — I428 Other cardiomyopathies: Secondary | ICD-10-CM | POA: Diagnosis present

## 2016-07-24 DIAGNOSIS — I481 Persistent atrial fibrillation: Principal | ICD-10-CM | POA: Diagnosis present

## 2016-07-24 DIAGNOSIS — I4811 Longstanding persistent atrial fibrillation: Secondary | ICD-10-CM | POA: Diagnosis present

## 2016-07-24 DIAGNOSIS — E876 Hypokalemia: Secondary | ICD-10-CM

## 2016-07-24 DIAGNOSIS — Z7901 Long term (current) use of anticoagulants: Secondary | ICD-10-CM

## 2016-07-24 DIAGNOSIS — Z5181 Encounter for therapeutic drug level monitoring: Secondary | ICD-10-CM

## 2016-07-24 DIAGNOSIS — Z8249 Family history of ischemic heart disease and other diseases of the circulatory system: Secondary | ICD-10-CM | POA: Diagnosis not present

## 2016-07-24 DIAGNOSIS — Z6836 Body mass index (BMI) 36.0-36.9, adult: Secondary | ICD-10-CM

## 2016-07-24 DIAGNOSIS — E669 Obesity, unspecified: Secondary | ICD-10-CM | POA: Diagnosis present

## 2016-07-24 DIAGNOSIS — I5022 Chronic systolic (congestive) heart failure: Secondary | ICD-10-CM | POA: Insufficient documentation

## 2016-07-24 DIAGNOSIS — I4819 Other persistent atrial fibrillation: Secondary | ICD-10-CM

## 2016-07-24 DIAGNOSIS — Z79899 Other long term (current) drug therapy: Secondary | ICD-10-CM

## 2016-07-24 DIAGNOSIS — I340 Nonrheumatic mitral (valve) insufficiency: Secondary | ICD-10-CM | POA: Diagnosis present

## 2016-07-24 HISTORY — DX: Encounter for therapeutic drug level monitoring: Z51.81

## 2016-07-24 HISTORY — DX: Other long term (current) drug therapy: Z79.899

## 2016-07-24 LAB — MAGNESIUM: Magnesium: 2.2 mg/dL (ref 1.7–2.4)

## 2016-07-24 LAB — BASIC METABOLIC PANEL
Anion gap: 6 (ref 5–15)
BUN: 12 mg/dL (ref 6–20)
CO2: 24 mmol/L (ref 22–32)
Calcium: 9.4 mg/dL (ref 8.9–10.3)
Chloride: 107 mmol/L (ref 101–111)
Creatinine, Ser: 0.9 mg/dL (ref 0.44–1.00)
GFR calc Af Amer: >60 mL/min (ref >60)
GFR calc non Af Amer: >60 mL/min (ref >60)
Glucose, Bld: 109 mg/dL — ABNORMAL HIGH (ref 65–99)
Potassium: 4 mmol/L (ref 3.5–5.1)
Sodium: 137 mmol/L (ref 135–145)

## 2016-07-24 MED ORDER — FUROSEMIDE 20 MG PO TABS
20.0000 mg | ORAL_TABLET | Freq: Every day | ORAL | Status: DC
  Administered 2016-07-24 – 2016-07-25 (×2): 20 mg via ORAL
  Filled 2016-07-24 (×2): qty 1

## 2016-07-24 MED ORDER — LOSARTAN POTASSIUM 25 MG PO TABS
25.0000 mg | ORAL_TABLET | Freq: Every day | ORAL | Status: DC
  Administered 2016-07-25 – 2016-07-27 (×3): 25 mg via ORAL
  Filled 2016-07-24 (×3): qty 1

## 2016-07-24 MED ORDER — METOPROLOL SUCCINATE ER 100 MG PO TB24
100.0000 mg | ORAL_TABLET | Freq: Every day | ORAL | Status: DC
  Administered 2016-07-25 – 2016-07-26 (×2): 100 mg via ORAL
  Filled 2016-07-24 (×2): qty 1

## 2016-07-24 MED ORDER — METOPROLOL TARTRATE 50 MG PO TABS
50.0000 mg | ORAL_TABLET | Freq: Every day | ORAL | Status: DC
  Administered 2016-07-25: 50 mg via ORAL
  Filled 2016-07-24: qty 1

## 2016-07-24 MED ORDER — APIXABAN 5 MG PO TABS
5.0000 mg | ORAL_TABLET | Freq: Two times a day (BID) | ORAL | Status: DC
  Administered 2016-07-24 – 2016-07-27 (×6): 5 mg via ORAL
  Filled 2016-07-24 (×6): qty 1

## 2016-07-24 MED ORDER — DIGOXIN 125 MCG PO TABS
0.1250 mg | ORAL_TABLET | Freq: Every day | ORAL | Status: DC
  Administered 2016-07-25 – 2016-07-26 (×2): 0.125 mg via ORAL
  Filled 2016-07-24 (×2): qty 1

## 2016-07-24 MED ORDER — SODIUM CHLORIDE 0.9% FLUSH
3.0000 mL | Freq: Two times a day (BID) | INTRAVENOUS | Status: DC
  Administered 2016-07-24 – 2016-07-27 (×5): 3 mL via INTRAVENOUS

## 2016-07-24 MED ORDER — SODIUM CHLORIDE 0.9% FLUSH: 3.0000 mL | INTRAVENOUS | Status: DC | PRN

## 2016-07-24 MED ORDER — SODIUM CHLORIDE 0.9 % IV SOLN: 250.0000 mL | INTRAVENOUS | Status: DC | PRN

## 2016-07-24 MED ORDER — DOFETILIDE 500 MCG PO CAPS
500.0000 ug | ORAL_CAPSULE | Freq: Two times a day (BID) | ORAL | Status: DC
  Administered 2016-07-24 – 2016-07-27 (×6): 500 ug via ORAL
  Filled 2016-07-24 (×6): qty 1

## 2016-07-24 NOTE — H&P (Signed)
H&P    Patient ID: Brandy [name] MRN: [id number], DOB/AGE: 11/04/1955 61 y.o.  Admit date: 07/24/2016 Date of Admit: 07/24/2016  Primary Physician: Elgie Collard, MD Primary Cardiologist: Dr. Rockey Situ Electrophysiologist: Dr. Caryl Comes  Reason for visit: Tikosyn initiation  HPI: Brandy [name] is a 61 y.o. female with persistent AFib (failed DCCV), chronic CHF (systolic), NICM, obesity and is being admitted to undergo Tikosyn initiation. The patient was seen in the AFib clinic, she had teaching regarding Tikosyn, need for in-hospital start, and protocol, the patient confirmed no missed doses of her Eliquis in > 3 weeks.  AF clinic reports med list reviewed and cleared for Tikosyn with no QT prolonging medicines.   The patient has also confirmed cost of drug is acceptable.  The patient is feeling well today, denies any illness, fever of late, no N/V/D, no CP.  She does have DOE that is at her baseline. She again confirms with me, no missed doses of her a/c in > 3 weeks, we discussed Tikosyn protocol, plans for DCCV Wed if not in SR  Past Medical History:  Diagnosis Date  . Chronic systolic CHF (congestive heart failure) ([place])    a. 03/2016 Echo: Ef 15-20%, diff HK, ant AK;  b. 05/2016 Echo: Ef 30-35%, diff HK, mildly dil LA/RA.  [name] Moderate mitral regurgitation    a. 03/2016 Echo: mod MR in setting of LV dysfxn.  [name] NICM (nonischemic cardiomyopathy) ([place])    a. 03/2016 Echo: EF 15-20%, diff HK, ant AK, mod MR, mod dil LA, mildly dil RA;  b. 04/2016 Cath: nl cors;  c. 05/2016 Echo: EF 30-35%, diff HK.  . Obesity   . Persistent atrial fibrillation ([place])    a. Dx 03/2016;  b. CHA2DS2VASc = 2-->Eliquis 5mg  BID;  b. 05/2016 Failed DCCV x 4.     Surgical History:  Past Surgical History:  Procedure Laterality Date  . CARDIAC CATHETERIZATION N/A 04/17/2016   Procedure: Left Heart Cath and Coronary Angiography;  Surgeon: Wellington Hampshire, MD;  Location: [facility];  Service: Cardiovascular;   Laterality: N/A;  . CHOLECYSTECTOMY    . ELECTROPHYSIOLOGIC STUDY N/A 05/26/2016   Procedure: Cardioversion;  Surgeon: Wellington Hampshire, MD;  Location: [facility];  Service: Cardiovascular;  Laterality: N/A;     Prescriptions Prior to Admission  Medication Sig Dispense Refill Last Dose  . apixaban (ELIQUIS) 5 MG TABS tablet Take 1 tablet (5 mg total) by mouth 2 (two) times daily. 60 tablet 3 07/24/2016 at 0800  . Cholecalciferol (VITAMIN D3) 5000 units CAPS Take 5,000 Units by mouth daily.   Past Week at Unknown time  . digoxin (LANOXIN) 0.125 MG tablet Take 1 tablet (0.125 mg total) by mouth daily. 30 tablet 3 07/24/2016 at Unknown time  . furosemide (LASIX) 20 MG tablet Take 1 tablet (20 mg total) by mouth daily. In the afternoon 30 tablet 3 07/23/2016 at Unknown time  . losartan (COZAAR) 25 MG tablet Take 1 tablet (25 mg total) by mouth daily. 30 tablet 3 07/24/2016 at Unknown time  . metoprolol (LOPRESSOR) 50 MG tablet Take one tablet (50 mg) by mouth once daily with your dose of long acting metoprolol   07/24/2016 at Unknown time  . metoprolol succinate (TOPROL-XL) 100 MG 24 hr tablet Take 100 mg by mouth daily.    07/24/2016 at 0800  . OIL OF OREGANO PO Take 1 application by mouth daily. To left big toe   07/23/2016 at Unknown time  . vitamin  C (ASCORBIC ACID) 500 MG tablet Take 500 mg by mouth daily.   07/24/2016 at Unknown time    Inpatient Medications:   Allergies: No Known Allergies  Social History   Social History  . Marital status: Widowed    Spouse name: N/A  . Number of children: N/A  . Years of education: N/A   Occupational History  . Not on file.   Social History Main Topics  . Smoking status: Never Smoker  . Smokeless tobacco: Never Used  . Alcohol use No  . Drug use: No  . Sexual activity: Not on file   Other Topics Concern  . Not on file   Social History Narrative  . No narrative on file     Family History  Problem Relation Age of Onset  . COPD Mother   .  COPD Father   . Heart disease Father   . Diabetes Neg Hx      Review of Systems: All other systems reviewed and are otherwise negative except as noted above.  Physical Exam: Vitals:   07/24/16 1239 07/24/16 1247  BP:  (!) 100/57  Temp:  97.7 F (36.5 C)  TempSrc:  Oral  Weight: 241 lb 13.5 oz (109.7 kg)   Height: 5\' 8"  (1.727 m)     GEN- The patient is well appearing, alert and oriented x 3 today.   HEENT: normocephalic, atraumatic; sclera clear, conjunctiva pink; hearing intact; oropharynx clear; neck supple, no JVP Lymph- no cervical lymphadenopathy Lungs- CTA b/l, normal work of breathing.  No wheezes, rales, rhonchi Heart- IRRR, no murmurs, rubs or gallops, PMI not laterally displaced GI- soft, non-tender, non-distended Extremities- no clubbing, cyanosis, trace edema, chronic looking skin changes MS- no significant deformity or atrophy Skin- warm and dry, no rash or lesion Psych- euthymic mood, full affect Neuro- no gross deficits observed  Labs:   Lab Results  Component Value Date   WBC 15.6 (H) 06/23/2016   HGB 14.5 06/23/2016   HCT 42.0 06/23/2016   MCV 87.2 06/23/2016   PLT 279 06/23/2016    Recent Labs Lab 07/24/16 1012  NA 137  K 4.0  CL 107  CO2 24  BUN 12  CREATININE 0.90  CALCIUM 9.4  GLUCOSE 109*      Radiology/Studies: No results found.  Reviewed by myself EKG: AFib, 88bpm, QRS 23ms, QTc 360ms TELEMETRY: AFib, 70'-90's 05/30/16: TTE Study Conclusions - Left ventricle: The cavity size was mildly dilated. Wall   thickness was normal. Systolic function was moderately to   severely reduced. The estimated ejection fraction was in the   range of 30% to 35%. Diffuse hypokinesis. - Left atrium: The atrium was mildly dilated.(5mm, 70.34ml) - Right atrium: The atrium was mildly dilated.   Assessment and Plan:   1. Persistent AFib, Tikosyn initiation     CHA2DS2Vasc is 2, on Eliquis     K+ 4.0     Mag 2.2     Creat 0.90 (calc Cr. Cl =  114)     QTc is acceptable to start     Start at 597mcg BID  06/23/16: Dig level 0.7, discussed with [facility], Ok to start with her dig level, and historically 0.5/0.7.  Recommends AM draw tomorrow and OK to start Tikosyn tonight  2. NICM     Felt to be tachy mediated     Appears compensated by exam    Signed, Tommye Standard, PA-C 07/24/2016 1:50 PM    Pt interviewed and examined  NICM  presumably rate related with failed drug free cardioversion now admitted for dofetilide  Anticipate DCCV on Wed if not spontaneously before then

## 2016-07-24 NOTE — Progress Notes (Signed)
Primary Care Physician: Elgie Collard, MD Referring Physician:Dr. Caydence Koenig is a 61 y.o. female with a h/o persistent atrial fibrillation that is in the atrial fibrillation clinic for initiation of tikosyn. She has had persistent afib since last fall. Last Echo showed showed  EF of 30-35% with left atrium 44% mm. Current drug list has been screened by Fuller [place], PharmD, without any qtc prolonging drugs. Precautions with QTC drugs discussed with the pt as well as cost of drug. Labs checked today at 4.0 and mag at 2.2. Creat clear cal at 119.96. QTC acceptable. No missed doses of DOAC for at least 3 weeks. She is prepared for today's admission. Bed assignment as been requested.   Today, she denies symptoms of palpitations, chest pain, shortness of breath, orthopnea, PND, lower extremity edema, dizziness, presyncope, syncope, or neurologic sequela. The patient is tolerating medications without difficulties and is otherwise without complaint today.   Past Medical History:  Diagnosis Date  . Chronic systolic CHF (congestive heart failure) ([place])    a. 03/2016 Echo: Ef 15-20%, diff HK, ant AK;  b. 05/2016 Echo: Ef 30-35%, diff HK, mildly dil LA/RA.  [name] Moderate mitral regurgitation    a. 03/2016 Echo: mod MR in setting of LV dysfxn.  [name] NICM (nonischemic cardiomyopathy) ([place])    a. 03/2016 Echo: EF 15-20%, diff HK, ant AK, mod MR, mod dil LA, mildly dil RA;  b. 04/2016 Cath: nl cors;  c. 05/2016 Echo: EF 30-35%, diff HK.  . Obesity   . Persistent atrial fibrillation ([place])    a. Dx 03/2016;  b. CHA2DS2VASc = 2-->Eliquis 5mg  BID;  b. 05/2016 Failed DCCV x 4.   Past Surgical History:  Procedure Laterality Date  . CARDIAC CATHETERIZATION N/A 04/17/2016   Procedure: Left Heart Cath and Coronary Angiography;  Surgeon: Wellington Hampshire, MD;  Location: [facility];  Service: Cardiovascular;  Laterality: N/A;  . CHOLECYSTECTOMY    . ELECTROPHYSIOLOGIC STUDY N/A 05/26/2016   Procedure: Cardioversion;  Surgeon: Wellington Hampshire, MD;  Location: [facility];  Service: Cardiovascular;  Laterality: N/A;    Current Outpatient Prescriptions  Medication Sig Dispense Refill  . apixaban (ELIQUIS) 5 MG TABS tablet Take 1 tablet (5 mg total) by mouth 2 (two) times daily. 60 tablet 3  . digoxin (LANOXIN) 0.125 MG tablet Take 1 tablet (0.125 mg total) by mouth daily. 30 tablet 3  . furosemide (LASIX) 20 MG tablet Take 1 tablet (20 mg total) by mouth daily. In the afternoon 30 tablet 3  . losartan (COZAAR) 25 MG tablet Take 1 tablet (25 mg total) by mouth daily. 30 tablet 3  . metoprolol (LOPRESSOR) 50 MG tablet Take one tablet (50 mg) by mouth once daily with your dose of long acting metoprolol    . metoprolol succinate (TOPROL-XL) 100 MG 24 hr tablet Take 1 tablet by mouth daily.     . vitamin C (ASCORBIC ACID) 500 MG tablet Take 500 mg by mouth daily.     No current facility-administered medications for this encounter.     No Known Allergies  Social History   Social History  . Marital status: Widowed    Spouse name: N/A  . Number of children: N/A  . Years of education: N/A   Occupational History  . Not on file.   Social History Main Topics  . Smoking status: Never Smoker  . Smokeless tobacco: Never Used  . Alcohol use No  . Drug use: No  .  Sexual activity: Not on file   Other Topics Concern  . Not on file   Social History Narrative  . No narrative on file    Family History  Problem Relation Age of Onset  . COPD Mother   . COPD Father   . Heart disease Father   . Diabetes Neg Hx     ROS- All systems are reviewed and negative except as per the HPI above  Physical Exam: Vitals:   07/24/16 1013  BP: 110/68  Pulse: 88  Weight: 242 lb (109.8 kg)  Height: 5\' 8"  (1.727 m)   Wt Readings from Last 3 Encounters:  07/24/16 242 lb (109.8 kg)  06/23/16 245 lb (111.1 kg)  06/23/16 247 lb (112 kg)    Labs: Lab Results  Component Value Date   NA 137  07/24/2016   K 4.0 07/24/2016   CL 107 07/24/2016   CO2 24 07/24/2016   GLUCOSE 109 (H) 07/24/2016   BUN 12 07/24/2016   CREATININE 0.90 07/24/2016   CALCIUM 9.4 07/24/2016   MG 2.2 07/24/2016   Lab Results  Component Value Date   INR 1.0 05/16/2016   No results found for: CHOL, HDL, LDLCALC, TRIG   GEN- The patient is well appearing, alert and oriented x 3 today.   Head- normocephalic, atraumatic Eyes-  Sclera clear, conjunctiva pink Ears- hearing intact Oropharynx- clear Neck- supple, no JVP Lymph- no cervical lymphadenopathy Lungs- Clear to ausculation bilaterally, normal work of breathing Heart- irregular rate and rhythm, no murmurs, rubs or gallops, PMI not laterally displaced GI- soft, NT, ND, + BS Extremities- no clubbing, cyanosis, or edema MS- no significant deformity or atrophy Skin- no rash or lesion Psych- euthymic mood, full affect Neuro- strength and sensation are intact  EKG- afib at 88 bpm, qrs int 72 ms, qtc 396 ms Epic records reviewed Echo-Study Conclusions  - Left ventricle: The cavity size was mildly dilated. Wall   thickness was normal. Systolic function was moderately to   severely reduced. The estimated ejection fraction was in the   range of 30% to 35%. Diffuse hypokinesis. - Left atrium: The atrium was mildly dilated. - Right atrium: The atrium was mildly dilated.    Assessment and Plan: 1. Persistent afib To be admitted to hospital for tikosyn initiation  Bed requested Qtc and renal function acceptable K+/mag at appropriate levels No missed doses of Doac for at least 3 weeks Understands cost of drug Since she has private insurance, her insurance company has recommended for her to be d/c on brand tikosyn with co pay coupon(which she will print off from web site) to present to pharmacy Will probably need Metoprolol tartrate d/ced in hospital and continue on succinate  Afib clinic as needed  Butch Penny C. Destine Ambroise, [facility] [address] [place], [place] [address] [phone]

## 2016-07-24 NOTE — Progress Notes (Signed)
Pharmacy Review for Dofetilide (Tikosyn) Initiation  Admit Complaint: 61 y.o. female admitted 07/24/2016 with atrial fibrillation to be initiated on dofetilide.   Assessment: 62 y.o. female with persistent AFib (failed DCCV), chronic CHF (systolic), NICM, obesity and is being admitted to undergo Tikosyn initiation.  Patient Exclusion Criteria: If any screening criteria checked as "Yes", then  patient  should NOT receive dofetilide until criteria item is corrected. If "Yes" please indicate correction plan.  YES  NO Patient  Exclusion Criteria Correction Plan  []  [x]  Baseline QTc interval is greater than or equal to 440 msec. IF above YES box checked dofetilide contraindicated unless patient has ICD; then may proceed if QTc 500-550 msec or with known ventricular conduction abnormalities may proceed with QTc 550-600 msec. QTc = 396   []  [x]  Magnesium level is less than 1.8 mEq/l : Last magnesium:  Lab Results  Component Value Date   MG 2.2 07/24/2016         []  [x]  Potassium level is less than 4 mEq/l : Last potassium:  Lab Results  Component Value Date   K 4.0 07/24/2016         []  [x]  Patient is known or suspected to have a digoxin level greater than 2 ng/ml: Lab Results  Component Value Date   DIGOXIN 0.7 (L) 06/23/2016    New level being checked  []  [x]  Creatinine clearance less than 20 ml/min (calculated using Cockcroft-Gault, actual body weight and serum creatinine): Estimated Creatinine Clearance: 85.2 mL/min (by C-G formula based on SCr of 0.9 mg/dL).    []  [x]  Patient has received drugs known to prolong the QT intervals within the last 48 hours (phenothiazines, tricyclics or tetracyclic antidepressants, erythromycin, H-1 antihistamines, cisapride, fluoroquinolones, azithromycin). Drugs not listed above may have an, as yet, undetected potential to prolong the QT interval, updated information on QT prolonging agents is available at this website:QT prolonging agents   []  [x]   Patient received a dose of hydrochlorothiazide (Oretic) alone or in any combination including triamterene (Dyazide, Maxzide) in the last 48 hours.   []  [x]  Patient received a medication known to increase dofetilide plasma concentrations prior to initial dofetilide dose:  . Trimethoprim (Primsol, Proloprim) in the last 36 hours . Verapamil (Calan, Verelan) in the last 36 hours or a sustained release dose in the last 72 hours . Megestrol (Megace) in the last 5 days  . Cimetidine (Tagamet) in the last 6 hours . Ketoconazole (Nizoral) in the last 24 hours . Itraconazole (Sporanox) in the last 48 hours  . Prochlorperazine (Compazine) in the last 36 hours    []  [x]  Patient is known to have a history of torsades de pointes; congenital or acquired long QT syndromes.   []  [x]  Patient has received a Class 1 antiarrhythmic with less than 2 half-lives since last dose. (Disopyramide, Quinidine, Procainamide, Lidocaine, Mexiletine, Flecainide, Propafenone)   []  [x]  Patient has received amiodarone therapy in the past 3 months or amiodarone level is greater than 0.3 ng/ml.    Patient has been appropriately anticoagulated with apixaban.  Ordering provider was confirmed at [email or web address] if they are not listed on the [place] Prescribers list.  Goal of Therapy: Follow renal function, electrolytes, potential drug interactions, and dose adjustment. Provide education and 1 week supply at discharge.  Plan:  [x]   Physician selected initial dose within range recommended for patients level of renal function - will monitor for response.  []   Physician selected initial dose outside of range recommended for patients level  of renal function - will discuss if the dose should be altered at this time.   Select One Calculated CrCl  Dose q12h  [x]  > 60 ml/min 500 mcg  []  40-60 ml/min 250 mcg  []  20-40 ml/min 125 mcg   2. Follow up QTc after the first 5 doses, renal function, electrolytes (K & Mg) daily x  3 days, dose adjustment, success of initiation and facilitate 1 week discharge supply as clinically indicated.  Erin Hearing PharmD., BCPS Clinical Pharmacist Pager [phone] 07/24/2016 2:45 PM

## 2016-07-25 DIAGNOSIS — Z79899 Other long term (current) drug therapy: Secondary | ICD-10-CM

## 2016-07-25 DIAGNOSIS — Z5181 Encounter for therapeutic drug level monitoring: Secondary | ICD-10-CM

## 2016-07-25 LAB — DIGOXIN LEVEL: Digoxin Level: 0.4 ng/mL — ABNORMAL LOW (ref 0.8–2.0)

## 2016-07-25 LAB — BASIC METABOLIC PANEL
Anion gap: 8 (ref 5–15)
BUN: 12 mg/dL (ref 6–20)
CO2: 24 mmol/L (ref 22–32)
Calcium: 8.9 mg/dL (ref 8.9–10.3)
Chloride: 106 mmol/L (ref 101–111)
Creatinine, Ser: 0.85 mg/dL (ref 0.44–1.00)
GFR calc Af Amer: >60 mL/min (ref >60)
GFR calc non Af Amer: >60 mL/min (ref >60)
Glucose, Bld: 125 mg/dL — ABNORMAL HIGH (ref 65–99)
Potassium: 3.7 mmol/L (ref 3.5–5.1)
Sodium: 138 mmol/L (ref 135–145)

## 2016-07-25 LAB — MAGNESIUM: Magnesium: 2.2 mg/dL (ref 1.7–2.4)

## 2016-07-25 MED ORDER — POTASSIUM CHLORIDE CRYS ER 20 MEQ PO TBCR
20.0000 meq | EXTENDED_RELEASE_TABLET | Freq: Once | ORAL | Status: AC
Start: 2016-07-25 — End: 2016-07-25
  Administered 2016-07-25: 20 meq via ORAL

## 2016-07-25 NOTE — Progress Notes (Signed)
    SUBJECTIVE: The patient is doing well today.  At this time, she denies chest pain, shortness of breath, or any new concerns.  CURRENT MEDICATIONS: . apixaban  5 mg Oral BID  . digoxin  0.125 mg Oral Daily  . dofetilide  500 mcg Oral BID  . furosemide  20 mg Oral Daily  . losartan  25 mg Oral Daily  . metoprolol  50 mg Oral Daily  . metoprolol succinate  100 mg Oral Daily  . potassium chloride  20 mEq Oral Once  . sodium chloride flush  3 mL Intravenous Q12H     OBJECTIVE: Physical Exam: Vitals:   07/25/16 0051 07/25/16 0458 07/25/16 0754 07/25/16 0755  BP: 128/63 122/72 (!) 111/54 133/62  Pulse: 88 83 86 67  Resp:   18 (!) 26  Temp: 97.7 F (36.5 C) 97.5 F (36.4 C) 97.6 F (36.4 C) 97.8 F (36.6 C)  TempSrc: Oral Oral Oral Oral  SpO2: 96% 96% 99% 99%  Weight:  240 lb 8 oz (109.1 kg)    Height:        Intake/Output Summary (Last 24 hours) at 07/25/16 0855 Last data filed at 07/25/16 0757  Gross per 24 hour  Intake              240 ml  Output              100 ml  Net              140 ml    Telemetry reveals atrial fibrillation, V rates around 100  GEN- The patient is well appearing, alert and oriented x 3 today.   Head- normocephalic, atraumatic Eyes-  Sclera clear, conjunctiva pink Ears- hearing intact Oropharynx- clear Neck- supple Lungs- Clear to ausculation bilaterally, normal work of breathing Heart- Tachycardic irregular rate and rhythm, no murmurs, rubs or gallops GI- soft, NT, ND, + BS Extremities- no clubbing, cyanosis, or edema Skin- no rash or lesion Psych- euthymic mood, full affect Neuro- strength and sensation are intact  LABS: Basic Metabolic Panel:  Recent Labs  07/24/16 1012 07/25/16 0336  NA 137 138  K 4.0 3.7  CL 107 106  CO2 24 24  GLUCOSE 109* 125*  BUN 12 12  CREATININE 0.90 0.85  CALCIUM 9.4 8.9  MG 2.2 2.2    ASSESSMENT AND PLAN:  Active Problems:   Visit for monitoring Tikosyn therapy  1.  Persistent atrial  fibrillation Continue Tikosyn Supplement K this morning Mg, QTc ok Plan DCCV tomorrow if still in AF Continue Eliquis long term for CHADS2VASC of 2 NPO after midnight tonight   2. NICM Euvolemic on exam Hopefully EF will improve with restoration of SR Continue current therapy  Chanetta Marshall, NP 07/25/2016 8:59 AM  Patient seen and examined. Tolerating dofetilide Still in atrial fibrillation with somewhat rapid rates Will increase rate control Anticipate cardioversion tomorrow

## 2016-07-25 NOTE — Care Management Note (Addendum)
Case Management Note  Patient Details  Name: Brandy [name] MRN: [id number] Date of Birth: Apr 30, 1956  Subjective/Objective:  Pt Presented for Tikosyn Load. Benefits check completed and pt will need prior authorization. Please call [phone] in order for co pay to be provided.            Action/Plan: Pt will need Rx for 7 day supply of Tikosyn without refills. CM will assist with Rx from [facility]. Pt uses [facility] in [place] and medication can be ordered. CM will try to provide pt with the Tikosyn [employer] pay card to see if medication cost could be lowered to $4.00.  Pt can always sign up online. No further needs at this time.    Expected Discharge Date:                  Expected Discharge Plan:  Home/Self Care  In-House Referral:  NA  Discharge planning Services  CM Consult, Medication Assistance  Post Acute Care Choice:  NA Choice offered to:  NA  DME Arranged:  N/A DME Agency:  NA  HH Arranged:  NA HH Agency:  NA  Status of Service:  Completed, signed off  If discussed at [place] of Stay Meetings, dates discussed:    Additional Comments: 1052 07-27-16 Jacqlyn Krauss, RN,BSN [phone] CM did call [facility] in [place] and Rx for Tikosyn will be ordered. Pt has downloaded Tikosyn Co pay coupon for $4.00. No further needs from CM at this time.   Bethena Roys, RN 07/25/2016, 3:58 PM

## 2016-07-25 NOTE — Progress Notes (Signed)
Pt converted to SB Will stop short acting Metoprolol DCCV for tomorrow cancelled  Chanetta Marshall, NP 07/25/2016 3:13 PM

## 2016-07-25 NOTE — Progress Notes (Signed)
Pt converted to SB. Pt states "feeling like she is put together now". Pt walking in hall without difficulty. Cont to monitor. Carroll Kinds RN

## 2016-07-25 NOTE — Plan of Care (Signed)
Problem: Cardiac: Goal: Ability to achieve and maintain adequate cardiopulmonary perfusion will improve Outcome: Progressing Pt returned to SR. Cont to monitor. Pt given education on Tikosyn with understanding

## 2016-07-26 ENCOUNTER — Encounter (HOSPITAL_COMMUNITY)
Admission: AD | Disposition: A | Discharge status: Home / Self Care | Payer: Self-pay | Source: Ambulatory Visit | Attending: Internal Medicine

## 2016-07-26 DIAGNOSIS — I5042 Chronic combined systolic (congestive) and diastolic (congestive) heart failure: Secondary | ICD-10-CM

## 2016-07-26 DIAGNOSIS — E876 Hypokalemia: Secondary | ICD-10-CM

## 2016-07-26 LAB — BASIC METABOLIC PANEL WITH GFR
Anion gap: 7 (ref 5–15)
BUN: 12 mg/dL (ref 6–20)
CO2: 26 mmol/L (ref 22–32)
Calcium: 8.9 mg/dL (ref 8.9–10.3)
Chloride: 105 mmol/L (ref 101–111)
Creatinine, Ser: 0.85 mg/dL (ref 0.44–1.00)
GFR calc Af Amer: >60 mL/min
GFR calc non Af Amer: >60 mL/min
Glucose, Bld: 124 mg/dL — ABNORMAL HIGH (ref 65–99)
Potassium: 3.7 mmol/L (ref 3.5–5.1)
Sodium: 138 mmol/L (ref 135–145)

## 2016-07-26 LAB — MAGNESIUM: Magnesium: 2.2 mg/dL (ref 1.7–2.4)

## 2016-07-26 SURGERY — CARDIOVERSION
Anesthesia: Monitor Anesthesia Care

## 2016-07-26 MED ORDER — POTASSIUM CHLORIDE CRYS ER 20 MEQ PO TBCR
40.0000 meq | EXTENDED_RELEASE_TABLET | Freq: Once | ORAL | Status: AC
  Administered 2016-07-26: 40 meq via ORAL
  Filled 2016-07-26: qty 2

## 2016-07-26 NOTE — Progress Notes (Signed)
Pt's potassium is 3.7, MD on call notified, awaiting call back.

## 2016-07-26 NOTE — Progress Notes (Signed)
Pt given 40MEq of KCL PO given per order.

## 2016-07-26 NOTE — Plan of Care (Signed)
Problem: Physical Regulation: Goal: Ability to maintain clinical measurements within normal limits will improve Outcome: Progressing ADL's at baseline/independent

## 2016-07-26 NOTE — Discharge Instructions (Signed)
You have an appointment set up with the [facility].  Multiple studies have shown that being followed by a dedicated atrial fibrillation clinic in addition to the standard care you receive from your other physicians improves health. We believe that enrollment in the atrial fibrillation clinic will allow [place] to better care for you.   The phone number to the [facility] is [phone]. The clinic is staffed Monday through Friday from 8:30am to 5pm.  Parking Directions: The clinic is located in the Heart and Vascular Building connected to [facility]. 1)From [address] turn on to [address] and go to the 3rd entrance  (Heart and Vascular entrance) on the right. 2)Look to the right for Heart &Vascular Parking Garage. 3)A code for the entrance is 5002.   4)Take the elevators to the 1st floor. Registration is in the room with the glass walls at the end of the hallway.  If you have any trouble parking or locating the clinic, please dont hesitate to call [phone].

## 2016-07-26 NOTE — Progress Notes (Signed)
    SUBJECTIVE: The patient is doing well today.  At this time, she denies chest pain, shortness of breath, or any new concerns.  She can tell an improvement in her overall energy in SR  CURRENT MEDICATIONS: . apixaban  5 mg Oral BID  . digoxin  0.125 mg Oral Daily  . dofetilide  500 mcg Oral BID  . furosemide  20 mg Oral Daily  . losartan  25 mg Oral Daily  . metoprolol succinate  100 mg Oral Daily  . sodium chloride flush  3 mL Intravenous Q12H     OBJECTIVE: Physical Exam: Vitals:   07/25/16 1043 07/25/16 2014 07/26/16 0030 07/26/16 0500  BP: 110/64 (!) 113/54 (!) 114/42 (!) 102/37  Pulse: 65 67 62 (!) 59  Resp: 18 18 18 18   Temp: 97.5 F (36.4 C) 98.9 F (37.2 C) 98.7 F (37.1 C) 98.1 F (36.7 C)  TempSrc: Oral Oral Oral Oral  SpO2: 99% 99%  98%  Weight:    241 lb (109.3 kg)  Height:        Intake/Output Summary (Last 24 hours) at 07/26/16 1245 Last data filed at 07/26/16 0600  Gross per 24 hour  Intake              480 ml  Output                0 ml  Net              480 ml    Telemetry is reviewed by myself: SB/SR 50's-70's, no V ectopy or arrhythmia  GEN- The patient is well appearing, alert and oriented x 3 today.   Head- normocephalic, atraumatic Eyes-  Sclera clear, conjunctiva pink Ears- hearing intact Oropharynx- clear Neck- supple Lungs- CTA b/l, normal work of breathing Heart- RRR, no murmurs, rubs or gallops GI- soft, NT, ND Extremities- no clubbing, cyanosis, or edema Skin- no rash or lesion Psych- euthymic mood, full affect Neuro- strength and sensation are intact  LABS: Basic Metabolic Panel:  Recent Labs  07/25/16 0336 07/26/16 0333  NA 138 138  K 3.7 3.7  CL 106 105  CO2 24 26  GLUCOSE 125* 124*  BUN 12 12  CREATININE 0.85 0.85  CALCIUM 8.9 8.9  MG 2.2 2.2    ASSESSMENT AND PLAN:  Active Problems:   Chronic combined systolic and diastolic heart failure, NYHA class 2 (HCC)   Persistent atrial fibrillation (HCC)   Visit  for monitoring Tikosyn therapy   Hypokalemia  1.  Persistent atrial fibrillation Continue Tikosyn s/p 3 doses Supplement K again  this morning, may require going home Mg, QTc ok SR yesterday, remains SB/SR Continue Eliquis long term for CHADS2VASC of 2 Anticipate d/c tomorrow  Patient is OK with brand drug cost, she will print out co-pay coupon from home  2. NICM Euvolemic on exam Hopefully EF will improve with restoration of SR Continue current therapy  Tommye Standard, PA-C 07/26/2016 12:45 PM  PT seen and examined ECG personally reviewed QTc about 420 msec K low needs repletion  In sinus rhythm and feeling better Will discontinue diuretic feeding hypokalemia Can use prn on discharge Also discontinue dig-- more mortality risk data Anticipate repeat echo in about 8 weeks Sinus brad may prompt decrease of meto and uptitration of losartan  There may also be benefit for aldactone if recurrent hypokalemia and persistent LV dysfunction

## 2016-07-27 ENCOUNTER — Telehealth (HOSPITAL_COMMUNITY): Payer: Self-pay | Admitting: *Deleted

## 2016-07-27 LAB — BASIC METABOLIC PANEL
Anion gap: 7 (ref 5–15)
BUN: 8 mg/dL (ref 6–20)
CO2: 25 mmol/L (ref 22–32)
Calcium: 9 mg/dL (ref 8.9–10.3)
Chloride: 106 mmol/L (ref 101–111)
Creatinine, Ser: 0.84 mg/dL (ref 0.44–1.00)
GFR calc Af Amer: >60 mL/min (ref >60)
GFR calc non Af Amer: >60 mL/min (ref >60)
Glucose, Bld: 114 mg/dL — ABNORMAL HIGH (ref 65–99)
Potassium: 3.9 mmol/L (ref 3.5–5.1)
Sodium: 138 mmol/L (ref 135–145)

## 2016-07-27 LAB — MAGNESIUM: Magnesium: 2.1 mg/dL (ref 1.7–2.4)

## 2016-07-27 MED ORDER — METOPROLOL SUCCINATE ER 50 MG PO TB24: 50.0000 mg | ORAL_TABLET | Freq: Every day | ORAL | 3 refills | Status: DC

## 2016-07-27 MED ORDER — DOFETILIDE 500 MCG PO CAPS: 500.0000 ug | ORAL_CAPSULE | Freq: Two times a day (BID) | ORAL | 3 refills | Status: DC

## 2016-07-27 MED ORDER — POTASSIUM CHLORIDE CRYS ER 20 MEQ PO TBCR
40.0000 meq | EXTENDED_RELEASE_TABLET | Freq: Once | ORAL | Status: AC
  Administered 2016-07-27: 40 meq via ORAL
  Filled 2016-07-27: qty 2

## 2016-07-27 MED ORDER — POTASSIUM CHLORIDE CRYS ER 20 MEQ PO TBCR: 20.0000 meq | EXTENDED_RELEASE_TABLET | Freq: Every day | ORAL | 1 refills | Status: DC

## 2016-07-27 MED ORDER — POTASSIUM CHLORIDE CRYS ER 20 MEQ PO TBCR: 20.0000 meq | EXTENDED_RELEASE_TABLET | Freq: Every day | ORAL | Status: DC

## 2016-07-27 MED ORDER — METOPROLOL SUCCINATE ER 50 MG PO TB24
50.0000 mg | ORAL_TABLET | Freq: Every day | ORAL | Status: DC
  Administered 2016-07-27: 50 mg via ORAL
  Filled 2016-07-27: qty 1

## 2016-07-27 NOTE — Discharge Summary (Signed)
ELECTROPHYSIOLOGY PROCEDURE DISCHARGE SUMMARY    Patient ID: Brandy [name],  MRN: [id number], DOB/AGE: [birth year] 61 y.o.  Admit date: 07/24/2016 Discharge date: 07/27/2016  Primary Care Physician: Elgie Collard, MD Primary Cardiologist: Rockey Situ Electrophysiologist: Caryl Comes  Primary Discharge Diagnosis:  1.  Persistent atrial fibrillation status post Tikosyn loading this admission  Secondary Discharge Diagnosis:  1.  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  Obesity 3.  Moderate MR 4.  Chronic systolic heart failure  No Known Allergies   Procedures This Admission:  1.  Tikosyn loading  Brief HPI: Brandy [name] is a 61 y.o. female with a past medical history as noted above.  They were referred to EP in the outpatient setting for treatment options of atrial fibrillation.  Risks, benefits, and alternatives to Tikosyn were reviewed with the patient who wished to proceed.    Hospital Course:  The patient was admitted and Tikosyn was initiated.  Renal function and electrolytes were followed during the hospitalization.  Their QTc remained stable. They were monitored until discharge on telemetry which demonstrated sinus rhythm.  On the day of discharge, they were examined by Dr Caryl Comes who considered them stable for discharge to home.  Follow-up has been arranged with the AF clinic in 1 week and with Dr Caryl Comes in 4 weeks.   Will need repeat echo in 8 weeks.   Physical Exam: Vitals:   07/26/16 0500 07/26/16 1409 07/26/16 2225 07/27/16 0600  BP: (!) 102/37 (!) 111/42 (!) 94/40 (!) 95/32  Pulse: (!) 59 63    Resp: 18 20 16 16   Temp: 98.1 F (36.7 C) 98 F (36.7 C) 97.9 F (36.6 C) 97.8 F (36.6 C)  TempSrc: Oral Oral Oral Oral  SpO2: 98% 100% 99% 100%  Weight: 241 lb (109.3 kg)   240 lb 11.2 oz (109.2 kg)  Height:       Labs:   Lab Results  Component Value Date   WBC 15.6 (H) 06/23/2016   HGB 14.5 06/23/2016   HCT 42.0 06/23/2016   MCV 87.2 06/23/2016   PLT 279  06/23/2016     Recent Labs Lab 07/27/16 0305  NA 138  K 3.9  CL 106  CO2 25  BUN 8  CREATININE 0.84  CALCIUM 9.0  GLUCOSE 114*     Discharge Medications:  Allergies as of 07/27/2016   No Known Allergies     Medication List    STOP taking these medications   digoxin 0.125 MG tablet Commonly known as:  LANOXIN   metoprolol 50 MG tablet Commonly known as:  LOPRESSOR     TAKE these medications   apixaban 5 MG Tabs tablet Commonly known as:  ELIQUIS Take 1 tablet (5 mg total) by mouth 2 (two) times daily.   dofetilide 500 MCG capsule Commonly known as:  TIKOSYN Take 1 capsule (500 mcg total) by mouth 2 (two) times daily.   furosemide 20 MG tablet Commonly known as:  LASIX Take 1 tablet (20 mg total) by mouth daily. In the afternoon   losartan 25 MG tablet Commonly known as:  COZAAR Take 1 tablet (25 mg total) by mouth daily.   metoprolol succinate 50 MG 24 hr tablet Commonly known as:  TOPROL-XL Take 1 tablet (50 mg total) by mouth daily. Take with or immediately following a meal. What changed:  medication strength  how much to take  additional instructions   OIL OF OREGANO PO Take 1 application by mouth daily. To left big  toe   potassium chloride SA 20 MEQ tablet Commonly known as:  K-DUR,KLOR-CON Take 1 tablet (20 mEq total) by mouth daily.   vitamin C 500 MG tablet Commonly known as:  ASCORBIC ACID Take 500 mg by mouth daily.   Vitamin D3 5000 units Caps Take 5,000 Units by mouth daily.       Disposition:  Discharge Instructions    Diet - low sodium heart healthy    Complete by:  As directed    Increase activity slowly    Complete by:  As directed      Follow-up Information    [facility] Follow up on 08/03/2016.   Specialty:  Cardiology Why:  10:30AM Contact information: [address] [id number] mc [place] [phone]       Virl Axe, MD Follow up on 08/28/2016.    Specialty:  Cardiology Why:  9:30AM Contact information: [address] [place] [address] [phone]           Duration of Discharge Encounter: Greater than 30 minutes including physician time.  Signed, Chanetta Marshall, NP 07/27/2016 7:59 AM

## 2016-07-27 NOTE — Progress Notes (Signed)
NURSING PROGRESS NOTE  Brandy [name] [id number] Discharge Data: 07/27/2016 1:59 PM Attending Provider: No att. providers found IDC:[id number], MD     Tilda Franco to be D/C'd Home per MD order.  Discussed with the patient the After Visit Summary and all questions fully answered. All IV's discontinued with no bleeding noted. All belongings returned to patient for patient to take home. Pt given discharge instructions on her new medications. Pt taken to car via wheelchair accompanied by a staff member.  Last Vital Signs:  Blood pressure (!) 117/43, pulse 62, temperature 97.8 F (36.6 C), temperature source Oral, resp. rate 16, height 5\' 8"  (1.727 m), weight 109.2 kg (240 lb 11.2 oz), SpO2 100 %.  Discharge Medication List Allergies as of 07/27/2016   No Known Allergies     Medication List    STOP taking these medications   digoxin 0.125 MG tablet Commonly known as:  LANOXIN   metoprolol 50 MG tablet Commonly known as:  LOPRESSOR     TAKE these medications   apixaban 5 MG Tabs tablet Commonly known as:  ELIQUIS Take 1 tablet (5 mg total) by mouth 2 (two) times daily.   dofetilide 500 MCG capsule Commonly known as:  TIKOSYN Take 1 capsule (500 mcg total) by mouth 2 (two) times daily.   furosemide 20 MG tablet Commonly known as:  LASIX Take 1 tablet (20 mg total) by mouth daily. In the afternoon   losartan 25 MG tablet Commonly known as:  COZAAR Take 1 tablet (25 mg total) by mouth daily.   metoprolol succinate 50 MG 24 hr tablet Commonly known as:  TOPROL-XL Take 1 tablet (50 mg total) by mouth daily. Take with or immediately following a meal. What changed:  medication strength  how much to take  additional instructions   OIL OF OREGANO PO Take 1 application by mouth daily. To left big toe   potassium chloride SA 20 MEQ tablet Commonly known as:  K-DUR,KLOR-CON Take 1 tablet (20 mEq total) by mouth daily.   vitamin C 500 MG tablet Commonly known as:   ASCORBIC ACID Take 500 mg by mouth daily.   Vitamin D3 5000 units Caps Take 5,000 Units by mouth daily.

## 2016-07-27 NOTE — Progress Notes (Signed)
    SUBJECTIVE: Without complanint and without dizziness with lower than normal BP  Likes sinus    CURRENT MEDICATIONS: . apixaban  5 mg Oral BID  . dofetilide  500 mcg Oral BID  . losartan  25 mg Oral Daily  . metoprolol succinate  100 mg Oral Daily  . sodium chloride flush  3 mL Intravenous Q12H     OBJECTIVE: Physical Exam: Vitals:   07/26/16 0500 07/26/16 1409 07/26/16 2225 07/27/16 0600  BP: (!) 102/37 (!) 111/42 (!) 94/40 (!) 95/32  Pulse: (!) 59 63    Resp: 18 20 16 16   Temp: 98.1 F (36.7 C) 98 F (36.7 C) 97.9 F (36.6 C) 97.8 F (36.6 C)  TempSrc: Oral Oral Oral Oral  SpO2: 98% 100% 99% 100%  Weight: 241 lb (109.3 kg)   240 lb 11.2 oz (109.2 kg)  Height:        Intake/Output Summary (Last 24 hours) at 07/27/16 4650 Last data filed at 07/26/16 2100  Gross per 24 hour  Intake              720 ml  Output                0 ml  Net              720 ml    Telemetry Personally reviewed  -- sinus with VEA Well developed and nourished in no acute distress HENT normal Neck supple with JVP-flat Clear Regular rate and rhythm, no murmurs or gallops Abd-soft with active BS No Clubbing cyanosis edema Skin-warm and dry A & Oriented  Grossly normal sensory and motor function  LABS:  Personally reviewed   Basic Metabolic Panel:  Recent Labs  07/26/16 0333 07/27/16 0305  NA 138 138  K 3.7 3.9  CL 105 106  CO2 26 25  GLUCOSE 124* 114*  BUN 12 8  CREATININE 0.85 0.84  CALCIUM 8.9 9.0  MG 2.2 2.1    ECG personally reviewed QT 440 QTc 480   ASSESSMENT AND PLAN:  Active Problems:   Chronic combined systolic and diastolic heart failure, NYHA class 2 (HCC)   Persistent atrial fibrillation (HCC)   Visit for monitoring Tikosyn therapy   Hypokalemia  1.  Persistent atrial fibrillation Continue Tikosyn s/p 3 doses Supplement K again  this morning, may require going home  e will print out co-pay coupon from home  2. NICM Euvolemic on exam   PT seen  and examined ECG personally reviewed QTc about 480 msec K low needs repletion will replete further Will need BMet  At next week ECG followup    Can use diuretic prn on discharge Also discontinued dig-- more mortality risk data Anticipate repeat echo in about 8 weeks With low BP will decrease metoprolol  Keep losartan at present dose

## 2016-07-27 NOTE — Plan of Care (Signed)
Problem: Education: Goal: Understanding of medication regimen will improve Outcome: Completed/Met Date Met: 07/27/16 Patient has received education on Tikosyn and verbalizes understanding.

## 2016-07-27 NOTE — Telephone Encounter (Signed)
Preauthorization for dofetilide approved through 07/27/17. Insurance would not approve branded tikosyn.

## 2016-07-29 ENCOUNTER — Telehealth: Payer: Self-pay | Admitting: Cardiovascular Disease

## 2016-07-29 NOTE — Telephone Encounter (Signed)
Pt called to report that her HR is intermittently increasing to the 130s, staying there for several minutes, and then returning to the 70-80bpm range.  She feels "uneasy" during these periods, but denies SOB/CP/presyncopal symptoms.  I suggested that she take an additional 25mg  of Toprol XL this evening, but to report to the ED should her HR increase again to the 130-140s and not come back down.  She expressed understanding.  I have also routed this encounter to the pt's primary cardiologist and NP for further evaluation and management after the weekend.  Clayborne Dana MD

## 2016-07-31 ENCOUNTER — Telehealth (HOSPITAL_COMMUNITY): Payer: Self-pay | Admitting: *Deleted

## 2016-07-31 NOTE — Telephone Encounter (Signed)
I cld pt to check on her since she called the after hours Saturday not feeling well with racing heart rate.  Pt was told to take extra Toprol 25 mg.  Pt did this and reported that helped a lot.  She feels much better and her rates are normal.  She will call us if anything else should come up.

## 2016-08-03 ENCOUNTER — Inpatient Hospital Stay (HOSPITAL_COMMUNITY): Admit: 2016-08-03 | Payer: BC Managed Care – PPO | Admitting: Nurse Practitioner

## 2016-08-03 ENCOUNTER — Encounter (HOSPITAL_COMMUNITY): Payer: Self-pay | Admitting: Nurse Practitioner

## 2016-08-03 ENCOUNTER — Ambulatory Visit (HOSPITAL_COMMUNITY)
Admission: RE | Admit: 2016-08-03 | Discharge: 2016-08-03 | Disposition: A | Discharge status: Home / Self Care | Payer: BC Managed Care – PPO | Source: Ambulatory Visit | Attending: Nurse Practitioner | Admitting: Nurse Practitioner

## 2016-08-03 VITALS — BP 124/82 | HR 69 | Ht 68.0 in | Wt 245.6 lb

## 2016-08-03 DIAGNOSIS — Z79899 Other long term (current) drug therapy: Secondary | ICD-10-CM | POA: Diagnosis not present

## 2016-08-03 DIAGNOSIS — I4819 Other persistent atrial fibrillation: Secondary | ICD-10-CM

## 2016-08-03 DIAGNOSIS — I4891 Unspecified atrial fibrillation: Secondary | ICD-10-CM | POA: Diagnosis present

## 2016-08-03 DIAGNOSIS — I481 Persistent atrial fibrillation: Secondary | ICD-10-CM | POA: Diagnosis not present

## 2016-08-03 DIAGNOSIS — Z7901 Long term (current) use of anticoagulants: Secondary | ICD-10-CM | POA: Insufficient documentation

## 2016-08-03 LAB — BASIC METABOLIC PANEL
Anion gap: 8 (ref 5–15)
BUN: 13 mg/dL (ref 6–20)
CO2: 23 mmol/L (ref 22–32)
Calcium: 9.3 mg/dL (ref 8.9–10.3)
Chloride: 106 mmol/L (ref 101–111)
Creatinine, Ser: 0.76 mg/dL (ref 0.44–1.00)
GFR calc Af Amer: >60 mL/min (ref >60)
GFR calc non Af Amer: >60 mL/min (ref >60)
Glucose, Bld: 94 mg/dL (ref 65–99)
Potassium: 3.9 mmol/L (ref 3.5–5.1)
Sodium: 137 mmol/L (ref 135–145)

## 2016-08-03 LAB — MAGNESIUM: Magnesium: 2 mg/dL (ref 1.7–2.4)

## 2016-08-03 NOTE — Progress Notes (Signed)
Primary Care Physician: Elgie Collard, MD Referring Physician:Dr. Takesha Steger is a 61 y.o. female with a h/o persistent atrial fibrillation that is in the atrial fibrillation clinic for initiation of tikosyn. She has had persistent afib since last fall. Last Echo showed showed  EF of 30-35% with left atrium 44% mm. Current drug list has been screened by Fuller [place], PharmD, without any qtc prolonging drugs. Precautions with QTC drugs discussed with the pt as well as cost of drug. Labs checked today at 4.0 and mag at 2.2. Creat clear cal at 119.96. QTC acceptable. No missed doses of DOAC for at least 3 weeks. She is prepared for today's admission. Bed assignment as been requested.   F/u afib clinic after tikosyn load, 3/22. Drug converted patient, DCCV not required. QTc stayed stable. She had a episode of afib Saturday pm. Called on call line and was told to take an extra 1/2 tab of metoprolol with return of normal rhythm within 30 mins. Otherwise is doing well. Has tikosyn and knows to take on a regular basis, no missed doses.   Today, she denies symptoms of palpitations, chest pain, shortness of breath, orthopnea, PND, lower extremity edema, dizziness, presyncope, syncope, or neurologic sequela. The patient is tolerating medications without difficulties and is otherwise without complaint today.   Past Medical History:  Diagnosis Date  . Chronic systolic CHF (congestive heart failure) ([place])    a. 03/2016 Echo: Ef 15-20%, diff HK, ant AK;  b. 05/2016 Echo: Ef 30-35%, diff HK, mildly dil LA/RA.  [name] Moderate mitral regurgitation    a. 03/2016 Echo: mod MR in setting of LV dysfxn.  [name] NICM (nonischemic cardiomyopathy) ([place])    a. 03/2016 Echo: EF 15-20%, diff HK, ant AK, mod MR, mod dil LA, mildly dil RA;  b. 04/2016 Cath: nl cors;  c. 05/2016 Echo: EF 30-35%, diff HK.  . Obesity   . Persistent atrial fibrillation ([place])    a. Dx 03/2016;  b. CHA2DS2VASc = 2-->Eliquis 5mg  BID;  b.  05/2016 Failed DCCV x 4.   Past Surgical History:  Procedure Laterality Date  . CARDIAC CATHETERIZATION N/A 04/17/2016   Procedure: Left Heart Cath and Coronary Angiography;  Surgeon: Wellington Hampshire, MD;  Location: [facility];  Service: Cardiovascular;  Laterality: N/A;  . CHOLECYSTECTOMY    . ELECTROPHYSIOLOGIC STUDY N/A 05/26/2016   Procedure: Cardioversion;  Surgeon: Wellington Hampshire, MD;  Location: [facility];  Service: Cardiovascular;  Laterality: N/A;    Current Outpatient Prescriptions  Medication Sig Dispense Refill  . apixaban (ELIQUIS) 5 MG TABS tablet Take 1 tablet (5 mg total) by mouth 2 (two) times daily. 60 tablet 3  . Cholecalciferol (VITAMIN D3) 5000 units CAPS Take 5,000 Units by mouth daily.    [name] dofetilide (TIKOSYN) 500 MCG capsule Take 1 capsule (500 mcg total) by mouth 2 (two) times daily. 180 capsule 3  . furosemide (LASIX) 20 MG tablet Take 1 tablet (20 mg total) by mouth daily. In the afternoon 30 tablet 3  . losartan (COZAAR) 25 MG tablet Take 1 tablet (25 mg total) by mouth daily. 30 tablet 3  . metoprolol succinate (TOPROL-XL) 50 MG 24 hr tablet Take 1 tablet (50 mg total) by mouth daily. Take with or immediately following a meal. 90 tablet 3  . OIL OF OREGANO PO Take 1 application by mouth daily. To left big toe    . potassium chloride SA (K-DUR,KLOR-CON) 20 MEQ tablet Take 1 tablet (  20 mEq total) by mouth daily. 90 tablet 1  . vitamin C (ASCORBIC ACID) 500 MG tablet Take 500 mg by mouth daily.     No current facility-administered medications for this encounter.     No Known Allergies  Social History   Social History  . Marital status: Widowed    Spouse name: N/A  . Number of children: N/A  . Years of education: N/A   Occupational History  . Not on file.   Social History Main Topics  . Smoking status: Never Smoker  . Smokeless tobacco: Never Used  . Alcohol use No  . Drug use: No  . Sexual activity: Not on file   Other Topics Concern    . Not on file   Social History Narrative  . No narrative on file    Family History  Problem Relation Age of Onset  . COPD Mother   . COPD Father   . Heart disease Father   . Diabetes Neg Hx     ROS- All systems are reviewed and negative except as per the HPI above  Physical Exam: Vitals:   08/03/16 1546  BP: 124/82  Pulse: 69  Weight: 245 lb 9.6 oz (111.4 kg)  Height: 5\' 8"  (1.727 m)   Wt Readings from Last 3 Encounters:  08/03/16 245 lb 9.6 oz (111.4 kg)  07/27/16 240 lb 11.2 oz (109.2 kg)  07/24/16 242 lb (109.8 kg)    Labs: Lab Results  Component Value Date   NA 138 07/27/2016   K 3.9 07/27/2016   CL 106 07/27/2016   CO2 25 07/27/2016   GLUCOSE 114 (H) 07/27/2016   BUN 8 07/27/2016   CREATININE 0.84 07/27/2016   CALCIUM 9.0 07/27/2016   MG 2.1 07/27/2016   Lab Results  Component Value Date   INR 1.0 05/16/2016   No results found for: CHOL, HDL, LDLCALC, TRIG   GEN- The patient is well appearing, alert and oriented x 3 today.   Head- normocephalic, atraumatic Eyes-  Sclera clear, conjunctiva pink Ears- hearing intact Oropharynx- clear Neck- supple, no JVP Lymph- no cervical lymphadenopathy Lungs- Clear to ausculation bilaterally, normal work of breathing Heart- irregular rate and rhythm, no murmurs, rubs or gallops, PMI not laterally displaced GI- soft, NT, ND, + BS Extremities- no clubbing, cyanosis, or edema MS- no significant deformity or atrophy Skin- no rash or lesion Psych- euthymic mood, full affect Neuro- strength and sensation are intact  EKG-NSR at 67 bpm, pr int 178 ms, qrs int 80 ms, qtc 462 ms Epic records reviewed Echo-Study Conclusions  - Left ventricle: The cavity size was mildly dilated. Wall   thickness was normal. Systolic function was moderately to   severely reduced. The estimated ejection fraction was in the   range of 30% to 35%. Diffuse hypokinesis. - Left atrium: The atrium was mildly dilated. - Right atrium: The  atrium was mildly dilated.    Assessment and Plan: 1. Persistent afib  In SR after tikosyn initiation Qtc  stable Bmet/mag drawn Continue Eliquis without missed doses Assistance forms given to pt  for tikosyn price assistance Continue on metoprolol succinate 50 mg a day and can take an extra 1/2 dose  if needed for fast heart episodes  F/u with Dr. Caryl Comes 4/16 Afib clinic as needed  Butch Penny C. Aneliese Beaudry, [facility] [address] [place], [place] [address] [phone]

## 2016-08-04 ENCOUNTER — Ambulatory Visit: Payer: BC Managed Care – PPO | Attending: Internal Medicine

## 2016-08-04 DIAGNOSIS — G4733 Obstructive sleep apnea (adult) (pediatric): Secondary | ICD-10-CM | POA: Diagnosis not present

## 2016-08-04 DIAGNOSIS — I491 Atrial premature depolarization: Secondary | ICD-10-CM | POA: Diagnosis not present

## 2016-08-04 DIAGNOSIS — R0683 Snoring: Secondary | ICD-10-CM | POA: Diagnosis present

## 2016-08-07 NOTE — Telephone Encounter (Signed)
Noted  Seen in Afib cllinic after call

## 2016-08-09 ENCOUNTER — Telehealth: Payer: Self-pay | Admitting: Internal Medicine

## 2016-08-09 ENCOUNTER — Encounter: Payer: Self-pay | Admitting: Internal Medicine

## 2016-08-09 NOTE — Telephone Encounter (Signed)
Brandy [name] called the after hours cardiology service to discuss yellow flashes in her vision that has been going on for around an one hour when we spoke.  She denied any other symptoms such as blurry vision, weakness, or difficulty with speaking.  Patient reports she had similar symptoms before in the past but they only lasted a few minutes.  She did bring this up with her optometrist and primary care physician before in the past.  Informed patient that her symptoms are atypical for a stroke.  If she continues to have symptoms, I informed patient to potentially be seen in the ER for further evaluation.  Liborio Nixon, MD

## 2016-08-14 ENCOUNTER — Ambulatory Visit (HOSPITAL_COMMUNITY)
Admission: RE | Admit: 2016-08-14 | Discharge: 2016-08-14 | Disposition: A | Discharge status: Home / Self Care | Payer: BC Managed Care – PPO | Source: Ambulatory Visit | Attending: Nurse Practitioner | Admitting: Nurse Practitioner

## 2016-08-14 ENCOUNTER — Encounter: Payer: Self-pay | Admitting: Internal Medicine

## 2016-08-14 DIAGNOSIS — I481 Persistent atrial fibrillation: Secondary | ICD-10-CM | POA: Insufficient documentation

## 2016-08-14 LAB — BASIC METABOLIC PANEL
Anion gap: 5 (ref 5–15)
BUN: 8 mg/dL (ref 6–20)
CO2: 28 mmol/L (ref 22–32)
Calcium: 9.3 mg/dL (ref 8.9–10.3)
Chloride: 105 mmol/L (ref 101–111)
Creatinine, Ser: 0.76 mg/dL (ref 0.44–1.00)
GFR calc Af Amer: >60 mL/min (ref >60)
GFR calc non Af Amer: >60 mL/min (ref >60)
Glucose, Bld: 102 mg/dL — ABNORMAL HIGH (ref 65–99)
Potassium: 4.2 mmol/L (ref 3.5–5.1)
Sodium: 138 mmol/L (ref 135–145)

## 2016-08-15 ENCOUNTER — Other Ambulatory Visit (HOSPITAL_COMMUNITY): Payer: Self-pay | Admitting: *Deleted

## 2016-08-15 MED ORDER — POTASSIUM CHLORIDE CRYS ER 20 MEQ PO TBCR: 20.0000 meq | EXTENDED_RELEASE_TABLET | Freq: Two times a day (BID) | ORAL | 3 refills | Status: DC

## 2016-08-28 ENCOUNTER — Ambulatory Visit (INDEPENDENT_AMBULATORY_CARE_PROVIDER_SITE_OTHER): Payer: BC Managed Care – PPO | Admitting: Internal Medicine

## 2016-08-28 ENCOUNTER — Encounter: Payer: Self-pay | Admitting: Internal Medicine

## 2016-08-28 VITALS — BP 126/72 | HR 67 | Ht 67.0 in | Wt 246.0 lb

## 2016-08-28 DIAGNOSIS — Z79899 Other long term (current) drug therapy: Secondary | ICD-10-CM | POA: Diagnosis not present

## 2016-08-28 DIAGNOSIS — I5022 Chronic systolic (congestive) heart failure: Secondary | ICD-10-CM | POA: Diagnosis not present

## 2016-08-28 DIAGNOSIS — I481 Persistent atrial fibrillation: Secondary | ICD-10-CM | POA: Diagnosis not present

## 2016-08-28 DIAGNOSIS — I428 Other cardiomyopathies: Secondary | ICD-10-CM | POA: Diagnosis not present

## 2016-08-28 DIAGNOSIS — I4819 Other persistent atrial fibrillation: Secondary | ICD-10-CM

## 2016-08-28 MED ORDER — APIXABAN 5 MG PO TABS: 5.0000 mg | ORAL_TABLET | Freq: Two times a day (BID) | ORAL | 3 refills | Status: DC

## 2016-08-28 MED ORDER — LOSARTAN POTASSIUM 25 MG PO TABS: 25.0000 mg | ORAL_TABLET | Freq: Every day | ORAL | 3 refills | Status: DC

## 2016-08-28 NOTE — Progress Notes (Signed)
Patient Care Team: Elgie Collard, MD as PCP - General (Obstetrics and Gynecology)   HPI  Brandy [name] is a 61 y.o. female Seen in follow-up for atrial fibrillation for which dofetilide was started 2/18 with spontaneous conversion. She has a history of nonischemic cardiomyopathy with persistent left ventricular dysfunction occurring in the context of her atrial fibrillation with rapid and poorly controlled rates. The hope was that it was rate related.  She has had  3 interval episodes of tachypalpitations, most recently last Friday. She was left with some residual fatigue.  Otherwise she has felt quite good. There've been no peripheral edema. She underwent a sleep study but has not yet heard the results.  Date Cr K Mg  3/18  0.76 3.9    4/18  0.76 4.2 2.0        Past Medical History:  Diagnosis Date  . Chronic systolic CHF (congestive heart failure) ([place])    a. 03/2016 Echo: Ef 15-20%, diff HK, ant AK;  b. 05/2016 Echo: Ef 30-35%, diff HK, mildly dil LA/RA.  [name] Moderate mitral regurgitation    a. 03/2016 Echo: mod MR in setting of LV dysfxn.  [name] NICM (nonischemic cardiomyopathy) ([place])    a. 03/2016 Echo: EF 15-20%, diff HK, ant AK, mod MR, mod dil LA, mildly dil RA;  b. 04/2016 Cath: nl cors;  c. 05/2016 Echo: EF 30-35%, diff HK.  . Obesity   . Persistent atrial fibrillation ([place])    a. Dx 03/2016;  b. CHA2DS2VASc = 2-->Eliquis 5mg  BID;  b. 05/2016 Failed DCCV x 4.    Past Surgical History:  Procedure Laterality Date  . CARDIAC CATHETERIZATION N/A 04/17/2016   Procedure: Left Heart Cath and Coronary Angiography;  Surgeon: Wellington Hampshire, MD;  Location: [facility];  Service: Cardiovascular;  Laterality: N/A;  . CHOLECYSTECTOMY    . ELECTROPHYSIOLOGIC STUDY N/A 05/26/2016   Procedure: Cardioversion;  Surgeon: Wellington Hampshire, MD;  Location: [facility];  Service: Cardiovascular;  Laterality: N/A;    Current Outpatient Prescriptions  Medication Sig Dispense  Refill  . apixaban (ELIQUIS) 5 MG TABS tablet Take 1 tablet (5 mg total) by mouth 2 (two) times daily. 60 tablet 3  . Cholecalciferol (VITAMIN D3) 5000 units CAPS Take 2,000 Units by mouth daily.     [name] dofetilide (TIKOSYN) 500 MCG capsule Take 1 capsule (500 mcg total) by mouth 2 (two) times daily. 180 capsule 3  . furosemide (LASIX) 20 MG tablet Take 20 mg by mouth daily as needed for fluid or edema.    [name] losartan (COZAAR) 25 MG tablet Take 1 tablet (25 mg total) by mouth daily. 30 tablet 3  . metoprolol succinate (TOPROL-XL) 50 MG 24 hr tablet Take 1 tablet (50 mg total) by mouth daily. Take with or immediately following a meal. 90 tablet 3  . OIL OF OREGANO PO Take 1 application by mouth daily. To left big toe    . potassium chloride SA (K-DUR,KLOR-CON) 20 MEQ tablet Take 1 tablet (20 mEq total) by mouth 2 (two) times daily. 60 tablet 3  . vitamin C (ASCORBIC ACID) 500 MG tablet Take 500 mg by mouth daily.     No current facility-administered medications for this visit.     No Known Allergies    Review of Systems negative except from HPI and PMH  Physical Exam BP 126/72   Pulse 67   Ht 5\' 7"  (1.702 m)   Wt 246 lb (111.6  kg)   SpO2 92%   BMI 38.53 kg/m  Well developed and well nourished in no acute distress HENT normal E scleral and icterus clear Neck Supple JVP flat; carotids brisk and full Clear to ausculation  Regular rate and rhythm, no murmurs gallops or rub Soft with active bowel sounds No clubbing cyanosis  Edema Alert and oriented, grossly normal motor and sensory function Skin Warm and Dry  ECG demonstrates sinus rhythm at 61 Intervals 18/07/44   Assessment and  Plan  Atrial fibrillatio-persistent  Cardiomyopathy-- nonischemic prob rate related  CHF chronic systolic  Stage 2  High Risk Medication Surveillance  Potassium supplementation  She is now in sinus rhythm for most of 2 months. We will anticipate an echocardiogram to assess left ventricular  function  Euvolemic continue current meds  We'll try to simplify the regime once we get the echocardiogram. The hope is that her cardiomyopathy has improved. Spironolactone may be worth undertaking to avoid the need for potassium supplementation and the need for when necessary furosemide.  We wait the results of her sleep study.       Current medicines are reviewed at length with the patient today .  The patient does not  have concerns regarding medicines.

## 2016-08-28 NOTE — Patient Instructions (Signed)
Medication Instructions: - Your physician recommends that you continue on your current medications as directed. Please refer to the Current Medication list given to you today.  Labwork: - Your physician recommends that you return for lab work (the day of your echo in [place])- [employer]  Procedures/Testing: - Your physician has requested that you have an echocardiogram- in [place] (early AM or late day). Echocardiography is a painless test that uses sound waves to create images of your heart. It provides your doctor with information about the size and shape of your heart and how well your heart's chambers and valves are working. This procedure takes approximately one hour. There are no restrictions for this procedure.  Follow-Up: - Your physician wants you to follow-up in: 3-4 months with Dr. Caryl Comes in [place]. You will receive a reminder letter in the mail two months in advance. If you don't receive a letter, please call our office to schedule the follow-up appointment.   Any Additional Special Instructions Will Be Listed Below (If Applicable).     If you need a refill on your cardiac medications before your next appointment, please call your pharmacy.

## 2016-09-08 ENCOUNTER — Telehealth: Payer: Self-pay | Admitting: Internal Medicine

## 2016-09-08 DIAGNOSIS — R002 Palpitations: Secondary | ICD-10-CM

## 2016-09-08 NOTE — Telephone Encounter (Signed)
Returned call to patient-patient reports she is having increased episode of rapid heart rate/palpitations since she has started the [place].  Reports having episode Sunday, Monday, and today.  When episodes occur she feels "really weird, funny" and exhausted.   Sometimes feels "balloon in chest or palpitations".   Reports episode that occurred earlier today, Hr was registering at 78-120 on her activity tracker, went to see the nurse at work and HR was taken manually 78 and BP 134/75.   Reports she did not take extra 12.5 metoprolol (as previously instructed as needed) but reports she is feeling better, "more settled".   Reports she is concerned as this is happening more frequently.  States if she was home when she had an episode she would lay down and take a nap and it would resolve.     Offered to make appt with Afib clinic asap as seen previously and route to Dr. Caryl Comes to see what recommendations he suggest.  Patient request to monitor s/sx over the weekend and will call if she changes her mind.   Advised if symptoms become worsening to go to ER for evaluation.    Advised that we have a 24/7 hour line that she may call over the weekend if needed.    Routed to MD for advice.

## 2016-09-08 NOTE — Telephone Encounter (Signed)
New message   Patient calling C/O in Afib now . Heart rate is between 48 to 120. Very concerns wants to know should she take a half of metoprolol.   Blood pressure taken at work   Pt c/o BP issue: STAT if pt c/o blurred vision, one-sided weakness or slurred speech  1. What are your last 5 BP readings? 134/75 @ 12:05 pm taken by a school nurse.    2. Are you having any other symptoms (ex. Dizziness, headache, blurred vision, passed out)? Feel like she exhausted   3. What is your BP issue?  Wants to discuss with a nurse.

## 2016-09-11 NOTE — Telephone Encounter (Signed)
wopuld suggest either 30 d monitor ( or [employer]) to ascertain the rhythm issue

## 2016-09-12 ENCOUNTER — Other Ambulatory Visit (HOSPITAL_COMMUNITY): Payer: Self-pay | Admitting: *Deleted

## 2016-09-12 MED ORDER — POTASSIUM CHLORIDE CRYS ER 20 MEQ PO TBCR: 20.0000 meq | EXTENDED_RELEASE_TABLET | Freq: Two times a day (BID) | ORAL | 6 refills | Status: DC

## 2016-09-13 NOTE — Telephone Encounter (Signed)
I left a message for the patient to call. 

## 2016-09-13 NOTE — Telephone Encounter (Signed)
I spoke with the patient. She states she did not have another episode of palpitations/ rapid heart since calling the office on 4/27, until last night. She is aware of Dr. Olin Pia recommendations for an event monitor vs [employer]. She would prefer to wear an event monitor.  I advised her I will order this and this monitor should be shipped to her home. She is agreeable.

## 2016-09-13 NOTE — Telephone Encounter (Signed)
New Message ° ° pt verbalized that she is returning call for rn °

## 2016-09-21 ENCOUNTER — Ambulatory Visit: Payer: BC Managed Care – PPO | Attending: Neurology

## 2016-09-21 DIAGNOSIS — G4733 Obstructive sleep apnea (adult) (pediatric): Secondary | ICD-10-CM | POA: Insufficient documentation

## 2016-09-21 DIAGNOSIS — I4891 Unspecified atrial fibrillation: Secondary | ICD-10-CM | POA: Diagnosis present

## 2016-09-25 ENCOUNTER — Ambulatory Visit (INDEPENDENT_AMBULATORY_CARE_PROVIDER_SITE_OTHER): Payer: BC Managed Care – PPO

## 2016-09-25 DIAGNOSIS — R002 Palpitations: Secondary | ICD-10-CM

## 2016-09-26 ENCOUNTER — Telehealth: Payer: Self-pay | Admitting: Internal Medicine

## 2016-09-26 DIAGNOSIS — I4891 Unspecified atrial fibrillation: Secondary | ICD-10-CM

## 2016-09-26 DIAGNOSIS — Z01818 Encounter for other preprocedural examination: Secondary | ICD-10-CM

## 2016-09-26 MED ORDER — METOPROLOL SUCCINATE ER 100 MG PO TB24: 100.0000 mg | ORAL_TABLET | Freq: Every day | ORAL | 3 refills | Status: DC

## 2016-09-26 NOTE — Telephone Encounter (Signed)
[employer] calling with a Serious EKG  Please advise

## 2016-09-26 NOTE — Telephone Encounter (Signed)
Pt is calling regarding more information she has gotten regarding her Metroprolol.

## 2016-09-26 NOTE — Telephone Encounter (Signed)
Received incoming call from [employer]. Patient's monitor is transmitting Atrial flutter with rates between 160-170.  They are faxing over the rhythm strips shortly. They are in the process of contacting that patient now.

## 2016-09-26 NOTE — Telephone Encounter (Signed)
Discussed patient strips and reviewed patient's chart with Dr Caryl Comes. Rhythm strips from [employer] show patient is in Atrial fibrillation. Dr Caryl Comes advises for patient to increase Toprol to 100 mg daily and arrange for cardioversion.

## 2016-09-26 NOTE — Telephone Encounter (Signed)
No answer at scheduling. Left message to call back to schedule cardioversion.

## 2016-09-26 NOTE — Telephone Encounter (Signed)
Called patient to check on her. 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 She possible was having a coughing spell as she has had a cough since January when she had the flu. Denies chest pain, SOB, dizziness, palpitations, nausea, sweating or blurred vision. She feels hot but states her classroom does not have good air conditioning. Her activity tracker currently shows her heart rate is 90.  Her main complaint is she has not felt well since Sunday. She means she has been very tired and no energy. States she's not getting adequate sleep at night and is due for a sleep study. Her cough also keep her up at night so she took Robitussin last night to see if that would work.  [employer] strips received. Will show to Dr Caryl Comes and give him this information.

## 2016-09-26 NOTE — Telephone Encounter (Signed)
Spoke with patient. Gave her Dr Olin Pia recommendations and she verbalized understanding. New RX sent to patient's pharmacy. Patient stated her job and insurance end on October 27, 2016, so she would like to have the cardioversion prior to that. Advised patient I will be in the process of scheduling and notify her with date and time. She verbalized understanding to not miss any doses of her Eliquis and that she would need preoperative lab work.

## 2016-09-26 NOTE — Telephone Encounter (Signed)
Returned call to patient. She wanted to make sure I had sent an Rx for Toprol to her pharmacy because she only had a few pills left. A rx was sent to correct pharmacy, so she will go and pick it up tomorrow. She said 10/06/16 was a good day for her cardioversion. Let her know I will verify dates and times tomorrow with scheduling and give her a call.

## 2016-09-27 NOTE — Telephone Encounter (Signed)
Returned call to patient. Let her know recommendations. Patient is due to come in for echo and labs on 10/02/16 and would like to get 12-lead EKG that day if possible as she works the rest of this week. Patient is unable to come in tomorrow or Friday due to she is working. Patient added on for EKG on 10/02/16. Patient will notify us if she has any changes.

## 2016-09-27 NOTE — Telephone Encounter (Signed)
Spoke with scheduling. Cardioversion scheduled for 10/06/16 at 0730.

## 2016-09-27 NOTE — Telephone Encounter (Signed)
Noted  

## 2016-09-27 NOTE — Telephone Encounter (Signed)
Received updated EKG reports from [employer] from yesterday around 11:18 and today around 0840.  Rhythm strips shown to [employer], PA who determined patient is back in Sinus rhythm.  He advised to cancel the cardioversion and have patient come in for EKG check to confirm rhythm in office.

## 2016-09-27 NOTE — Telephone Encounter (Signed)
Spoke with patient. She verbalized understanding of the following instructions:  You are scheduled for a Cardioversion on __05/25/18______ with Dr.__Gollan__ Please arrive at the [place] [facility] at __06:30 a.m. on the day of your procedure.  DIET INSTRUCTIONS:  Nothing to eat or drink after midnight except your medications with a small sip of water.         1) Labs: _over the next week______  2) Medications:  YOU MAY TAKE ALL of your remaining medications with a small amount of water.  3) Must have a responsible person to drive you home.  4) Bring a current list of your medications and current insurance cards.    If you have any questions after you get home, please call the office at [phone]    Patient also noted this morning she woke up and "feels rested today." She feels she is back in rhythm because she feels better and her pulse is regular, not irregular and thready as it was over the past few days. Advised patient I would call [employer] to see if they can given me updated report of her heart rhythm and also will make Dr Caryl Comes aware of information.

## 2016-10-02 ENCOUNTER — Ambulatory Visit (INDEPENDENT_AMBULATORY_CARE_PROVIDER_SITE_OTHER): Payer: BC Managed Care – PPO

## 2016-10-02 ENCOUNTER — Other Ambulatory Visit (INDEPENDENT_AMBULATORY_CARE_PROVIDER_SITE_OTHER): Payer: BC Managed Care – PPO | Admitting: *Deleted

## 2016-10-02 ENCOUNTER — Ambulatory Visit (INDEPENDENT_AMBULATORY_CARE_PROVIDER_SITE_OTHER): Payer: BC Managed Care – PPO | Admitting: *Deleted

## 2016-10-02 ENCOUNTER — Other Ambulatory Visit: Payer: Self-pay

## 2016-10-02 VITALS — BP 112/72 | HR 56 | Ht 67.0 in | Wt 243.5 lb

## 2016-10-02 DIAGNOSIS — I4819 Other persistent atrial fibrillation: Secondary | ICD-10-CM

## 2016-10-02 DIAGNOSIS — I428 Other cardiomyopathies: Secondary | ICD-10-CM | POA: Diagnosis not present

## 2016-10-02 DIAGNOSIS — I4891 Unspecified atrial fibrillation: Secondary | ICD-10-CM

## 2016-10-02 DIAGNOSIS — Z79899 Other long term (current) drug therapy: Secondary | ICD-10-CM

## 2016-10-02 DIAGNOSIS — I481 Persistent atrial fibrillation: Secondary | ICD-10-CM | POA: Diagnosis not present

## 2016-10-02 NOTE — Progress Notes (Signed)
1.) Reason for visit: EKG visit  2.) Name of MD requesting visit: Dr Caryl Comes  3.) H&P: hx atrial fib, tachypalpitations  4.) ROS related to problem: Patient wearing 30-day event monitor that showed Atrial fib. Patient was scheduled for cardioversion; however, patient felt she was back in normal rhythm. [employer] monitoring then showed patient to be back in sinus rhythm when shown to PA. Christell Faith, PA advised for patient to come in for a 12-lead EKG to verify. Patient with no other complaints today and she is feeling rested and much better.  5.) Assessment and plan per MD: EKG performed and shown to Dr Rockey Situ who confirmed it to be sinus bradycardia. Patient to follow up with Dr Caryl Comes as planned in 3-4 months. Routing chart to Dr Caryl Comes for review.

## 2016-10-03 LAB — BASIC METABOLIC PANEL
BUN/Creatinine Ratio: 12 (ref 12–28)
BUN: 10 mg/dL (ref 8–27)
CO2: 17 mmol/L — ABNORMAL LOW (ref 18–29)
Calcium: 9.8 mg/dL (ref 8.7–10.3)
Chloride: 107 mmol/L — ABNORMAL HIGH (ref 96–106)
Creatinine, Ser: 0.83 mg/dL (ref 0.57–1.00)
GFR calc Af Amer: 87 mL/min/{1.73_m2} (ref >59)
GFR calc non Af Amer: 76 mL/min/{1.73_m2} (ref >59)
Glucose: 105 mg/dL — ABNORMAL HIGH (ref 65–99)
Potassium: 4.9 mmol/L (ref 3.5–5.2)
Sodium: 140 mmol/L (ref 134–144)

## 2016-10-06 ENCOUNTER — Ambulatory Visit
Admission: RE | Admit: 2016-10-06 | Payer: BC Managed Care – PPO | Source: Ambulatory Visit | Admitting: Internal Medicine

## 2016-10-06 ENCOUNTER — Encounter: Admission: RE | Payer: Self-pay | Source: Ambulatory Visit

## 2016-10-06 SURGERY — CARDIOVERSION (CATH LAB)
Anesthesia: General

## 2016-10-10 ENCOUNTER — Other Ambulatory Visit: Payer: Self-pay | Admitting: *Deleted

## 2016-10-10 DIAGNOSIS — I480 Paroxysmal atrial fibrillation: Secondary | ICD-10-CM

## 2016-10-31 ENCOUNTER — Encounter: Payer: Self-pay | Admitting: Family Medicine

## 2016-10-31 ENCOUNTER — Ambulatory Visit (INDEPENDENT_AMBULATORY_CARE_PROVIDER_SITE_OTHER): Payer: BC Managed Care – PPO

## 2016-10-31 ENCOUNTER — Ambulatory Visit (INDEPENDENT_AMBULATORY_CARE_PROVIDER_SITE_OTHER): Payer: BC Managed Care – PPO | Admitting: Family Medicine

## 2016-10-31 VITALS — BP 110/62 | HR 70 | Temp 98.3°F | Resp 12 | Ht 66.0 in | Wt 248.6 lb

## 2016-10-31 DIAGNOSIS — M19041 Primary osteoarthritis, right hand: Secondary | ICD-10-CM | POA: Diagnosis not present

## 2016-10-31 DIAGNOSIS — R5383 Other fatigue: Secondary | ICD-10-CM

## 2016-10-31 DIAGNOSIS — I481 Persistent atrial fibrillation: Secondary | ICD-10-CM

## 2016-10-31 DIAGNOSIS — I4819 Other persistent atrial fibrillation: Secondary | ICD-10-CM

## 2016-10-31 DIAGNOSIS — I5042 Chronic combined systolic (congestive) and diastolic (congestive) heart failure: Secondary | ICD-10-CM

## 2016-10-31 DIAGNOSIS — E559 Vitamin D deficiency, unspecified: Secondary | ICD-10-CM | POA: Diagnosis not present

## 2016-10-31 DIAGNOSIS — Z13220 Encounter for screening for lipoid disorders: Secondary | ICD-10-CM | POA: Diagnosis not present

## 2016-10-31 DIAGNOSIS — R7303 Prediabetes: Secondary | ICD-10-CM | POA: Diagnosis not present

## 2016-10-31 DIAGNOSIS — Z1211 Encounter for screening for malignant neoplasm of colon: Secondary | ICD-10-CM

## 2016-10-31 DIAGNOSIS — Z0000 Encounter for general adult medical examination without abnormal findings: Secondary | ICD-10-CM | POA: Insufficient documentation

## 2016-10-31 LAB — HEMOGLOBIN A1C: Hgb A1c MFr Bld: 6.2 % (ref 4.6–6.5)

## 2016-10-31 LAB — VITAMIN D 25 HYDROXY (VIT D DEFICIENCY, FRACTURES): VITD: 38.71 ng/mL (ref 30.00–100.00)

## 2016-10-31 LAB — LIPID PANEL
Cholesterol: 191 mg/dL (ref 0–200)
HDL: 41.3 mg/dL (ref >39.00)
NonHDL: 150.1
Total CHOL/HDL Ratio: 5
Triglycerides: 216 mg/dL — ABNORMAL HIGH (ref 0.0–149.0)
VLDL: 43.2 mg/dL — ABNORMAL HIGH (ref 0.0–40.0)

## 2016-10-31 LAB — VITAMIN B12: Vitamin B-12: 193 pg/mL — ABNORMAL LOW (ref 211–911)

## 2016-10-31 LAB — LDL CHOLESTEROL, DIRECT: Direct LDL: 131 mg/dL

## 2016-10-31 NOTE — Patient Instructions (Signed)
We will call regarding the [place] and the colonoscopy.  We will call with your lab results and the xray results.  Continue your medications.  Follow up in 6 months.  Take care  Dr. Adriana Simas   Health Maintenance, Female Adopting a healthy lifestyle and getting preventive care can go a long way to promote health and wellness. Talk with your health care provider about what schedule of regular examinations is right for you. This is a good chance for you to check in with your provider about disease prevention and staying healthy. In between checkups, there are plenty of things you can do on your own. Experts have done a lot of research about which lifestyle changes and preventive measures are most likely to keep you healthy. Ask your health care provider for more information. Weight and diet Eat a healthy diet  Be sure to include plenty of vegetables, fruits, low-fat dairy products, and lean protein.  Do not eat a lot of foods high in solid fats, added sugars, or salt.  Get regular exercise. This is one of the most important things you can do for your health. ? Most adults should exercise for at least 150 minutes each week. The exercise should increase your heart rate and make you sweat (moderate-intensity exercise). ? Most adults should also do strengthening exercises at least twice a week. This is in addition to the moderate-intensity exercise.  Maintain a healthy weight  Body mass index (BMI) is a measurement that can be used to identify possible weight problems. It estimates body fat based on height and weight. Your health care provider can help determine your BMI and help you achieve or maintain a healthy weight.  For females 57 years of age and older: ? A BMI below 18.5 is considered underweight. ? A BMI of 18.5 to 24.9 is normal. ? A BMI of 25 to 29.9 is considered overweight. ? A BMI of 30 and above is considered obese.  Watch levels of cholesterol and blood lipids  You should start  having your blood tested for lipids and cholesterol at 62 years of age, then have this test every 5 years.  You may need to have your cholesterol levels checked more often if: ? Your lipid or cholesterol levels are high. ? You are older than 62 years of age. ? You are at high risk for heart disease.  Cancer screening Lung Cancer  Lung cancer screening is recommended for adults 63-27 years old who are at high risk for lung cancer because of a history of smoking.  A yearly low-dose CT scan of the lungs is recommended for people who: ? Currently smoke. ? Have quit within the past 15 years. ? Have at least a 30-pack-year history of smoking. A pack year is smoking an average of one pack of cigarettes a day for 1 year.  Yearly screening should continue until it has been 15 years since you quit.  Yearly screening should stop if you develop a health problem that would prevent you from having lung cancer treatment.  Breast Cancer  Practice breast self-awareness. This means understanding how your breasts normally appear and feel.  It also means doing regular breast self-exams. Let your health care provider know about any changes, no matter how small.  If you are in your 20s or 30s, you should have a clinical breast exam (CBE) by a health care provider every 1-3 years as part of a regular health exam.  If you are 40 or older, have  a CBE every year. Also consider having a breast X-ray (mammogram) every year.  If you have a family history of breast cancer, talk to your health care provider about genetic screening.  If you are at high risk for breast cancer, talk to your health care provider about having an MRI and a mammogram every year.  Breast cancer gene (BRCA) assessment is recommended for women who have family members with BRCA-related cancers. BRCA-related cancers include: ? Breast. ? Ovarian. ? Tubal. ? Peritoneal cancers.  Results of the assessment will determine the need for  genetic counseling and BRCA1 and BRCA2 testing.  Cervical Cancer Your health care provider may recommend that you be screened regularly for cancer of the pelvic organs (ovaries, uterus, and vagina). This screening involves a pelvic examination, including checking for microscopic changes to the surface of your cervix (Pap test). You may be encouraged to have this screening done every 3 years, beginning at age 49.  For women ages 14-65, health care providers may recommend pelvic exams and Pap testing every 3 years, or they may recommend the Pap and pelvic exam, combined with testing for human papilloma virus (HPV), every 5 years. Some types of HPV increase your risk of cervical cancer. Testing for HPV may also be done on women of any age with unclear Pap test results.  Other health care providers may not recommend any screening for nonpregnant women who are considered low risk for pelvic cancer and who do not have symptoms. Ask your health care provider if a screening pelvic exam is right for you.  If you have had past treatment for cervical cancer or a condition that could lead to cancer, you need Pap tests and screening for cancer for at least 20 years after your treatment. If Pap tests have been discontinued, your risk factors (such as having a new sexual partner) need to be reassessed to determine if screening should resume. Some women have medical problems that increase the chance of getting cervical cancer. In these cases, your health care provider may recommend more frequent screening and Pap tests.  Colorectal Cancer  This type of cancer can be detected and often prevented.  Routine colorectal cancer screening usually begins at 62 years of age and continues through 62 years of age.  Your health care provider may recommend screening at an earlier age if you have risk factors for colon cancer.  Your health care provider may also recommend using home test kits to check for hidden blood in the  stool.  A small camera at the end of a tube can be used to examine your colon directly (sigmoidoscopy or colonoscopy). This is done to check for the earliest forms of colorectal cancer.  Routine screening usually begins at age 70.  Direct examination of the colon should be repeated every 5-10 years through 62 years of age. However, you may need to be screened more often if early forms of precancerous polyps or small growths are found.  Skin Cancer  Check your skin from head to toe regularly.  Tell your health care provider about any new moles or changes in moles, especially if there is a change in a mole's shape or color.  Also tell your health care provider if you have a mole that is larger than the size of a pencil eraser.  Always use sunscreen. Apply sunscreen liberally and repeatedly throughout the day.  Protect yourself by wearing long sleeves, pants, a wide-brimmed hat, and sunglasses whenever you are outside.  Heart  disease, diabetes, and high blood pressure  High blood pressure causes heart disease and increases the risk of stroke. High blood pressure is more likely to develop in: ? People who have blood pressure in the high end of the normal range (130-139/85-89 mm Hg). ? People who are overweight or obese. ? People who are African American.  If you are 86-35 years of age, have your blood pressure checked every 3-5 years. If you are 73 years of age or older, have your blood pressure checked every year. You should have your blood pressure measured twice-once when you are at a hospital or clinic, and once when you are not at a hospital or clinic. Record the average of the two measurements. To check your blood pressure when you are not at a hospital or clinic, you can use: ? An automated blood pressure machine at a pharmacy. ? A home blood pressure monitor.  If you are between 28 years and 37 years old, ask your health care provider if you should take aspirin to prevent  strokes.  Have regular diabetes screenings. This involves taking a blood sample to check your fasting blood sugar level. ? If you are at a normal weight and have a low risk for diabetes, have this test once every three years after 62 years of age. ? If you are overweight and have a high risk for diabetes, consider being tested at a younger age or more often. Preventing infection Hepatitis B  If you have a higher risk for hepatitis B, you should be screened for this virus. You are considered at high risk for hepatitis B if: ? You were born in a country where hepatitis B is common. Ask your health care provider which countries are considered high risk. ? Your parents were born in a high-risk country, and you have not been immunized against hepatitis B (hepatitis B vaccine). ? You have HIV or AIDS. ? You use needles to inject street drugs. ? You live with someone who has hepatitis B. ? You have had sex with someone who has hepatitis B. ? You get hemodialysis treatment. ? You take certain medicines for conditions, including cancer, organ transplantation, and autoimmune conditions.  Hepatitis C  Blood testing is recommended for: ? Everyone born from [birth year] through 1966. ? Anyone with known risk factors for hepatitis C.  Sexually transmitted infections (STIs)  You should be screened for sexually transmitted infections (STIs) including gonorrhea and chlamydia if: ? You are sexually active and are younger than 62 years of age. ? You are older than 62 years of age and your health care provider tells you that you are at risk for this type of infection. ? Your sexual activity has changed since you were last screened and you are at an increased risk for chlamydia or gonorrhea. Ask your health care provider if you are at risk.  If you do not have HIV, but are at risk, it may be recommended that you take a prescription medicine daily to prevent HIV infection. This is called pre-exposure prophylaxis  (PrEP). You are considered at risk if: ? You are sexually active and do not regularly use condoms or know the HIV status of your partner(s). ? You take drugs by injection. ? You are sexually active with a partner who has HIV.  Talk with your health care provider about whether you are at high risk of being infected with HIV. If you choose to begin PrEP, you should first be tested for HIV. You  should then be tested every 3 months for as long as you are taking PrEP. Pregnancy  If you are premenopausal and you may become pregnant, ask your health care provider about preconception counseling.  If you may become pregnant, take 400 to 800 micrograms (mcg) of folic acid every day.  If you want to prevent pregnancy, talk to your health care provider about birth control (contraception). Osteoporosis and menopause  Osteoporosis is a disease in which the bones lose minerals and strength with aging. This can result in serious bone fractures. Your risk for osteoporosis can be identified using a bone density scan.  If you are 69 years of age or older, or if you are at risk for osteoporosis and fractures, ask your health care provider if you should be screened.  Ask your health care provider whether you should take a calcium or vitamin D supplement to lower your risk for osteoporosis.  Menopause may have certain physical symptoms and risks.  Hormone replacement therapy may reduce some of these symptoms and risks. Talk to your health care provider about whether hormone replacement therapy is right for you. Follow these instructions at home:  Schedule regular health, dental, and eye exams.  Stay current with your immunizations.  Do not use any tobacco products including cigarettes, chewing tobacco, or electronic cigarettes.  If you are pregnant, do not drink alcohol.  If you are breastfeeding, limit how much and how often you drink alcohol.  Limit alcohol intake to no more than 1 drink per day for  nonpregnant women. One drink equals 12 ounces of beer, 5 ounces of wine, or 1 ounces of hard liquor.  Do not use street drugs.  Do not share needles.  Ask your health care provider for help if you need support or information about quitting drugs.  Tell your health care provider if you often feel depressed.  Tell your health care provider if you have ever been abused or do not feel safe at home. This information is not intended to replace advice given to you by your health care provider. Make sure you discuss any questions you have with your health care provider. Document Released: 11/14/2010 Document Revised: 10/07/2015 Document Reviewed: 02/02/2015 Elsevier Interactive Patient Education  [employer].

## 2016-10-31 NOTE — Progress Notes (Signed)
Subjective:  Patient ID: Brandy [name], female    DOB: Sep 18, 1955  Age: 61 y.o. MRN: [id number]  CC: Establish care  HPI Brandy [name] is a 61 y.o. female presents to the clinic today to establish care. Issues are below.  CHF  Stable currently  Ejection fraction markedly improved as of late. Current EF 50-55%.  Follows with cardiology.  Currently on metoprolol, losartan, Lasix.  Atrial fibrillation  Follows with the EP.  Currently on Tikosyn, metoprolol, Eliquis.  He currently feels poorly as she is not in sinus rhythm.  She states that EP is considering ablation.  Preventative health care  Unsure of last tetanus. Was done at the health department. We'll inquire about this.  Desires breast cancer screening but only wants ultrasound.  Needs colonoscopy and desires.  Declines pneumococcal vaccine as well as shingles vaccine.  Pap smear performed 3 years ago by GYN. She follows with GYN.  Needs screening lipid panel as well as follow-up A1c due to prediabetes.  Hand pain, particularly the PIP (right middle finger).  Has been going on for years.  Decreased range of motion of the middle finger. She also has intermittent pain of the other digits at times.  She is quite concerned about this. She wants to see what can be done.  Moderate in severity. Doesn't appear with activity. No known relieving factors.  PMH, Surgical Hx, Family Hx, Social History reviewed and updated as below.  Past Medical History:  Diagnosis Date  . Chicken pox   . Chronic systolic CHF (congestive heart failure) ([place])    a. 03/2016 Echo: Ef 15-20%, diff HK, ant AK;  b. 05/2016 Echo: Ef 30-35%, diff HK, mildly dil LA/RA.  [name] History of kidney stones   . Moderate mitral regurgitation    a. 03/2016 Echo: mod MR in setting of LV dysfxn.  [name] NICM (nonischemic cardiomyopathy) ([place])    a. 03/2016 Echo: EF 15-20%, diff HK, ant AK, mod MR, mod dil LA, mildly dil RA;  b. 04/2016 Cath: nl cors;  c. 05/2016  Echo: EF 30-35%, diff HK.  . Obesity   . Persistent atrial fibrillation ([place])    a. Dx 03/2016;  b. CHA2DS2VASc = 2-->Eliquis 5mg  BID;  b. 05/2016 Failed DCCV x 4.   Past Surgical History:  Procedure Laterality Date  . CARDIAC CATHETERIZATION N/A 04/17/2016   Procedure: Left Heart Cath and Coronary Angiography;  Surgeon: Wellington Hampshire, MD;  Location: [facility];  Service: Cardiovascular;  Laterality: N/A;  . CHOLECYSTECTOMY    . ELECTROPHYSIOLOGIC STUDY N/A 05/26/2016   Procedure: Cardioversion;  Surgeon: Wellington Hampshire, MD;  Location: [facility];  Service: Cardiovascular;  Laterality: N/A;   Family History  Problem Relation Age of Onset  . COPD Mother   . COPD Father   . Heart disease Father   . Diabetes Neg Hx    Social History  Substance Use Topics  . Smoking status: Never Smoker  . Smokeless tobacco: Never Used  . Alcohol use No    Review of Systems  Cardiovascular: Positive for palpitations.  Gastrointestinal: Positive for diarrhea.  Genitourinary:       Urinary incontinence.  Musculoskeletal: Positive for arthralgias.  Neurological: Positive for dizziness.  Psychiatric/Behavioral: The patient is nervous/anxious.   All other systems reviewed and are negative.   Objective:   Today's Vitals: BP 110/62 (BP Location: Left Arm, Patient Position: Sitting, Cuff Size: Large)   Pulse 70   Temp 98.3 F (36.8 C) (  Oral)   Resp 12   Ht 5\' 6"  (1.676 m)   Wt 248 lb 9.6 oz (112.8 kg)   SpO2 96%   BMI 40.13 kg/m   Physical Exam  Constitutional: She is oriented to person, place, and time.  Morbid obesity female in no acute distress.  HENT:  Head: Normocephalic and atraumatic.  Mouth/Throat: Oropharynx is clear and moist.  Normal TM's bilaterally.    Eyes: Conjunctivae are normal. No scleral icterus.  Neck: Neck supple. No thyromegaly present.  Cardiovascular: Normal rate.   Irregular.  Pulmonary/Chest: Effort normal. She has no wheezes. She has no rales.    Abdominal: Soft. She exhibits no distension. There is no tenderness. There is no rebound and no guarding.  Musculoskeletal:  Right hand - enlargement/swelling of the PIP joint of the right middle finger. Decreased range of motion particularly with flexion. No surrounding erythema or warmth.  Neurological: She is alert and oriented to person, place, and time.  Skin: Skin is warm. No rash noted.  Lower extremities with varicosities.  Psychiatric: She has a normal mood and affect.  Vitals reviewed.  Assessment & Plan:   Problem List Items Addressed This Visit      Cardiovascular and Mediastinum   Chronic combined systolic and diastolic heart failure, NYHA class 2 ([place]) - Primary    Currently stable and improving. Continue metoprolol and losartan as well as Lasix, per cardiology.      Persistent atrial fibrillation [facility])    Patient will likely need ablation. She is not in sinus today. Continue Tikosyn, metoprolol, Eliquis. Continue following with EP.        Musculoskeletal and Integument   Osteoarthritis of right hand    New problem. X-ray revealed mild to moderate osteoarthritis. Advising Tylenol for pain. Do not recommend NSAIDs given her cardiac history.      Relevant Orders   DG Hand Complete Right (Completed)     Other   Preventative health care    Labs today. Declines immunizations. Wants [place] for breast cancer screening. [facility] will not allow this (they state that they do not do complete ultrasounds). Referral coordinator to discuss with patient. Referring to GI for colonoscopy.       Other Visit Diagnoses    Screening, lipid       Relevant Orders   Lipid panel   Prediabetes       Relevant Orders   Hemoglobin A1c   Vitamin D deficiency       Relevant Orders   Vitamin D (25 hydroxy)   Fatigue, unspecified type       Relevant Orders   Vitamin B12   Colon cancer screening       Relevant Orders   Ambulatory referral to Gastroenterology     Follow-up: 6  months  [place]

## 2016-10-31 NOTE — Assessment & Plan Note (Signed)
Currently stable and improving. Continue metoprolol and losartan as well as Lasix, per cardiology.

## 2016-10-31 NOTE — Assessment & Plan Note (Addendum)
Labs today. Declines immunizations. Wants [place] for breast cancer screening. [facility] will not allow this (they state that they do not do complete ultrasounds). Referral coordinator to discuss with patient. Referring to GI for colonoscopy.

## 2016-10-31 NOTE — Progress Notes (Signed)
Pre-visit discussion using our clinic review tool. No additional management support is needed unless otherwise documented below in the visit note.  

## 2016-10-31 NOTE — Assessment & Plan Note (Signed)
New problem. X-ray revealed mild to moderate osteoarthritis. Advising Tylenol for pain. Do not recommend NSAIDs given her cardiac history.

## 2016-10-31 NOTE — Assessment & Plan Note (Signed)
Patient will likely need ablation. She is not in sinus today. Continue Tikosyn, metoprolol, Eliquis. Continue following with EP.

## 2016-11-01 ENCOUNTER — Telehealth: Payer: Self-pay | Admitting: Family Medicine

## 2016-11-01 NOTE — Telephone Encounter (Signed)
Patient notified

## 2016-11-01 NOTE — Telephone Encounter (Signed)
Pt called back returning your call. Please advise, thank you!  Call pt @ [phone]

## 2016-11-08 ENCOUNTER — Other Ambulatory Visit: Payer: Self-pay

## 2016-11-08 ENCOUNTER — Telehealth: Payer: Self-pay

## 2016-11-08 DIAGNOSIS — Z1211 Encounter for screening for malignant neoplasm of colon: Secondary | ICD-10-CM

## 2016-11-08 NOTE — Telephone Encounter (Signed)
Gastroenterology Pre-Procedure Review  Request Date:  Requesting Physician: Dr.   PATIENT REVIEW QUESTIONS: The patient responded to the following health history questions as indicated:    1. Are you having any GI issues? no 2. Do you have a personal history of Polyps? no 3. Do you have a family history of Colon Cancer or Polyps? no 4. Diabetes Mellitus? no 5. Joint replacements in the past 12 months?no 6. Major health problems in the past 3 months?no 7. Any artificial heart valves, MVP, or defibrillator?no    MEDICATIONS & ALLERGIES:    Patient reports the following regarding taking any anticoagulation/antiplatelet therapy:   Plavix, Coumadin, Eliquis, Xarelto, Lovenox, Pradaxa, Brilinta, or Effient? yes (Eliquis) Aspirin? no  Patient confirms/reports the following medications:  Current Outpatient Prescriptions  Medication Sig Dispense Refill  . apixaban (ELIQUIS) 5 MG TABS tablet Take 1 tablet (5 mg total) by mouth 2 (two) times daily. 180 tablet 3  . Cholecalciferol (VITAMIN D3) 5000 units CAPS Take 2,000 Units by mouth daily.     [name] dofetilide (TIKOSYN) 500 MCG capsule Take 1 capsule (500 mcg total) by mouth 2 (two) times daily. 180 capsule 3  . fluticasone (FLONASE) 50 MCG/ACT nasal spray Place into both nostrils 2 (two) times daily as needed for allergies or rhinitis.    . furosemide (LASIX) 20 MG tablet Take 20 mg by mouth daily as needed for fluid or edema.    [name] loratadine (CLARITIN) 10 MG tablet Take 10 mg by mouth daily.    [name] losartan (COZAAR) 25 MG tablet Take 1 tablet (25 mg total) by mouth daily. 90 tablet 3  . metoprolol succinate (TOPROL-XL) 100 MG 24 hr tablet Take 1 tablet (100 mg total) by mouth daily. Take with or immediately following a meal. 90 tablet 3  . OIL OF OREGANO PO Take 1 application by mouth daily. To left big toe    . potassium chloride SA (K-DUR,KLOR-CON) 20 MEQ tablet Take 1 tablet (20 mEq total) by mouth 2 (two) times daily. 60 tablet 6  . vitamin C  (ASCORBIC ACID) 500 MG tablet Take 500 mg by mouth daily.     No current facility-administered medications for this visit.     Patient confirms/reports the following allergies:  No Known Allergies  No orders of the defined types were placed in this encounter.   AUTHORIZATION INFORMATION Primary Insurance: 1D#: Group #:  Secondary Insurance: 1D#: Group #:  SCHEDULE INFORMATION: Date: 12/01/16 Time: Location: [place]

## 2016-11-30 ENCOUNTER — Ambulatory Visit (INDEPENDENT_AMBULATORY_CARE_PROVIDER_SITE_OTHER): Payer: BC Managed Care – PPO | Admitting: Internal Medicine

## 2016-11-30 ENCOUNTER — Encounter: Payer: Self-pay | Admitting: Internal Medicine

## 2016-11-30 VITALS — BP 130/62 | HR 62 | Ht 67.0 in | Wt 250.2 lb

## 2016-11-30 DIAGNOSIS — I428 Other cardiomyopathies: Secondary | ICD-10-CM | POA: Diagnosis not present

## 2016-11-30 DIAGNOSIS — I4819 Other persistent atrial fibrillation: Secondary | ICD-10-CM

## 2016-11-30 DIAGNOSIS — I5022 Chronic systolic (congestive) heart failure: Secondary | ICD-10-CM | POA: Diagnosis not present

## 2016-11-30 DIAGNOSIS — Z79899 Other long term (current) drug therapy: Secondary | ICD-10-CM

## 2016-11-30 DIAGNOSIS — I481 Persistent atrial fibrillation: Secondary | ICD-10-CM | POA: Diagnosis not present

## 2016-11-30 MED ORDER — METOPROLOL SUCCINATE ER 50 MG PO TB24: 50.0000 mg | ORAL_TABLET | Freq: Every day | ORAL | 3 refills | Status: DC

## 2016-11-30 NOTE — Progress Notes (Signed)
Patient Care Team: Coral Spikes, DO as PCP - General (Family Medicine)   HPI  Brandy [name] is a 61 y.o. female Seen in follow-up for atrial fibrillation for which dofetilide was started 2/18 with spontaneous conversion. She has a history of nonischemic cardiomyopathy with persistent left ventricular dysfunction occurring in the context of her atrial fibrillation with rapid and poorly controlled rates. The hope was that it was rate related. LV function has normalized as below  She has had  3 interval episodes of tachypalpitations, most recently last Friday. She was left with some residual fatigue.  Otherwise she has felt quite good. There've been no peripheral edema. She underwent a sleep study and is currently on CPAP. It has not made a significant difference yet    She is also complaining of fatigue. She wonders whether it is related to her metoprolol.  Date Cr K Mg Hgb  3/18  0.76 3.9   14.5 (2/18)  4/18  0.76 4.2 2.0   5/18 0.83 4.9           DATE TEST    11/17    Echo   EF 15-20 %   1/18  Echo   EF 35 %   5/18 Echo EF 50-55% LA size-normal     6/18 LDL 119   Thromboembolic risk factors ( HTN-1, CHF-1, Gender-1) for a CHADSVASc Score of  3  Past Medical History:  Diagnosis Date  . Chicken pox   . Chronic systolic CHF (congestive heart failure) ([place])    a. 03/2016 Echo: Ef 15-20%, diff HK, ant AK;  b. 05/2016 Echo: Ef 30-35%, diff HK, mildly dil LA/RA.  [name] History of kidney stones   . Moderate mitral regurgitation    a. 03/2016 Echo: mod MR in setting of LV dysfxn.  [name] NICM (nonischemic cardiomyopathy) ([place])    a. 03/2016 Echo: EF 15-20%, diff HK, ant AK, mod MR, mod dil LA, mildly dil RA;  b. 04/2016 Cath: nl cors;  c. 05/2016 Echo: EF 30-35%, diff HK.  . Obesity   . Persistent atrial fibrillation ([place])    a. Dx 03/2016;  b. CHA2DS2VASc = 2-->Eliquis 5mg  BID;  b. 05/2016 Failed DCCV x 4.    Past Surgical History:  Procedure Laterality Date  . CARDIAC  CATHETERIZATION N/A 04/17/2016   Procedure: Left Heart Cath and Coronary Angiography;  Surgeon: Wellington Hampshire, MD;  Location: [facility];  Service: Cardiovascular;  Laterality: N/A;  . CHOLECYSTECTOMY    . ELECTROPHYSIOLOGIC STUDY N/A 05/26/2016   Procedure: Cardioversion;  Surgeon: Wellington Hampshire, MD;  Location: [facility];  Service: Cardiovascular;  Laterality: N/A;    Current Outpatient Prescriptions  Medication Sig Dispense Refill  . apixaban (ELIQUIS) 5 MG TABS tablet Take 1 tablet (5 mg total) by mouth 2 (two) times daily. 180 tablet 3  . Cholecalciferol (VITAMIN D3) 5000 units CAPS Take 2,000 Units by mouth daily.     [name] dofetilide (TIKOSYN) 500 MCG capsule Take 1 capsule (500 mcg total) by mouth 2 (two) times daily. 180 capsule 3  . fluticasone (FLONASE) 50 MCG/ACT nasal spray Place into both nostrils 2 (two) times daily as needed for allergies or rhinitis.    . furosemide (LASIX) 20 MG tablet Take 20 mg by mouth daily as needed for fluid or edema.    [name] loratadine (CLARITIN) 10 MG tablet Take 10 mg by mouth daily.    . metoprolol succinate (TOPROL-XL) 100 MG 24  hr tablet Take 1 tablet (100 mg total) by mouth daily. Take with or immediately following a meal. 90 tablet 3  . OIL OF OREGANO PO Take 1 application by mouth daily. To left big toe    . potassium chloride SA (K-DUR,KLOR-CON) 20 MEQ tablet Take 1 tablet (20 mEq total) by mouth 2 (two) times daily. 60 tablet 6  . vitamin B-12 (CYANOCOBALAMIN) 1000 MCG tablet Take 1,000 mcg by mouth 2 (two) times daily.    . vitamin C (ASCORBIC ACID) 500 MG tablet Take 500 mg by mouth daily.    [name] losartan (COZAAR) 25 MG tablet Take 1 tablet (25 mg total) by mouth daily. 90 tablet 3   No current facility-administered medications for this visit.     No Known Allergies    Review of Systems negative except from HPI and PMH  Physical Exam BP 130/62 (BP Location: Left Arm, Patient Position: Sitting, Cuff Size: Normal)   Pulse 62    Ht 5\' 7"  (1.702 m)   Wt 250 lb 4 oz (113.5 kg)   BMI 39.19 kg/m  Well developed and nourished in no acute distress HENT normal Neck supple with JVP-flat Carotids brisk and full without bruits Clear Regular rate and rhythm, no murmurs or gallops Abd-soft with active BS without hepatomegaly No Clubbing cyanosis edema Skin-warm and dry A & Oriented  Grossly normal sensory and motor function y  ECG demonstrates sinus rhythm at 62 Intervals 16/07/45    Assessment and  Plan  Atrial fibrillation-persistent  Cardiomyopathy-- nonischemic-rate related resolved  High Risk Medication Surveillance  Hypertension  Hypokalemia intermittent  Fatigue  Acidemia   Fatigue may be secondary to her beta blocker. We will start by decreasing the dose 100--50. There may be a bradycardic components of this which should attenuate. If it does not resolve with this down titration we'll try different beta blocker.   Current medicines are reviewed at length with the patient today .  The patient does not  have concerns regarding medicines.  She would like to consider catheter ablation. Her left atrial size is normal. She has treated  OSA.  We will arrange a consultation with Dr. [name]  We will continue her on dofetilide for now. She will continue her anticoagulation  No bleeding issues  we will check her CBC and her magnesium  We will also continue her beta blocker/ARB given her cardiomyopathy which was so severe   Her last metabolic profile had a low bicarbonate. We will recheck that today. That was an isolated event.

## 2016-11-30 NOTE — Patient Instructions (Addendum)
Medication Instructions: - Your physician has recommended you make the following change in your medication:  1) Decrease toprol (metoprolol succinate) to 50 mg- take 1 tablet daily at BEDTIME   Labwork: - Your physician recommends that you have lab work today: BMP/ CBC/ Magnesium  Procedures/Testing: - none ordered  Follow-Up: - You have been referred to Dr. Rayann Heman (in [place])- evaluation for a-fib ablation  (Dr. Jackalyn Lombard scheduler, Lenna Sciara, will be in touch with you to arrange)   Any Additional Special Instructions Will Be Listed Below (If Applicable).     If you need a refill on your cardiac medications before your next appointment, please call your pharmacy.

## 2016-12-01 ENCOUNTER — Ambulatory Visit
Admission: RE | Admit: 2016-12-01 | Payer: BC Managed Care – PPO | Source: Ambulatory Visit | Admitting: Gastroenterology

## 2016-12-01 ENCOUNTER — Encounter: Admission: RE | Payer: Self-pay | Source: Ambulatory Visit

## 2016-12-01 LAB — CBC WITH DIFFERENTIAL/PLATELET
Basophils Absolute: 0.1 10*3/uL (ref 0.0–0.2)
Basos: 1 %
EOS (ABSOLUTE): 0.2 10*3/uL (ref 0.0–0.4)
Eos: 2 %
Hematocrit: 44.2 % (ref 34.0–46.6)
Hemoglobin: 15.1 g/dL (ref 11.1–15.9)
Immature Grans (Abs): 0 10*3/uL (ref 0.0–0.1)
Immature Granulocytes: 0 %
Lymphocytes Absolute: 2.7 10*3/uL (ref 0.7–3.1)
Lymphs: 40 %
MCH: 30.9 pg (ref 26.6–33.0)
MCHC: 34.2 g/dL (ref 31.5–35.7)
MCV: 90 fL (ref 79–97)
Monocytes Absolute: 0.8 10*3/uL (ref 0.1–0.9)
Monocytes: 12 %
Neutrophils Absolute: 3.1 10*3/uL (ref 1.4–7.0)
Neutrophils: 45 %
Platelets: 292 10*3/uL (ref 150–379)
RBC: 4.89 x10E6/uL (ref 3.77–5.28)
RDW: 13.3 % (ref 12.3–15.4)
WBC: 6.8 10*3/uL (ref 3.4–10.8)

## 2016-12-01 LAB — BASIC METABOLIC PANEL
BUN/Creatinine Ratio: 13 (ref 12–28)
BUN: 11 mg/dL (ref 8–27)
CO2: 21 mmol/L (ref 20–29)
Calcium: 9.8 mg/dL (ref 8.7–10.3)
Chloride: 103 mmol/L (ref 96–106)
Creatinine, Ser: 0.85 mg/dL (ref 0.57–1.00)
GFR calc Af Amer: 85 mL/min/{1.73_m2} (ref >59)
GFR calc non Af Amer: 74 mL/min/{1.73_m2} (ref >59)
Glucose: 100 mg/dL — ABNORMAL HIGH (ref 65–99)
Potassium: 4.9 mmol/L (ref 3.5–5.2)
Sodium: 142 mmol/L (ref 134–144)

## 2016-12-01 LAB — MAGNESIUM: Magnesium: 2.2 mg/dL (ref 1.6–2.3)

## 2016-12-01 SURGERY — COLONOSCOPY WITH PROPOFOL
Anesthesia: Choice

## 2016-12-04 ENCOUNTER — Telehealth: Payer: Self-pay

## 2016-12-04 NOTE — Telephone Encounter (Signed)
lmtcb for lab results

## 2016-12-04 NOTE — Telephone Encounter (Signed)
New message      Pt was calling back with lab results

## 2016-12-11 ENCOUNTER — Ambulatory Visit (INDEPENDENT_AMBULATORY_CARE_PROVIDER_SITE_OTHER): Payer: BC Managed Care – PPO | Admitting: Internal Medicine

## 2016-12-11 ENCOUNTER — Encounter: Payer: Self-pay | Admitting: Internal Medicine

## 2016-12-11 VITALS — BP 126/74 | HR 62 | Ht 67.0 in | Wt 253.6 lb

## 2016-12-11 DIAGNOSIS — I428 Other cardiomyopathies: Secondary | ICD-10-CM | POA: Diagnosis not present

## 2016-12-11 DIAGNOSIS — I4819 Other persistent atrial fibrillation: Secondary | ICD-10-CM

## 2016-12-11 DIAGNOSIS — I481 Persistent atrial fibrillation: Secondary | ICD-10-CM | POA: Diagnosis not present

## 2016-12-11 NOTE — Progress Notes (Signed)
Electrophysiology Office Note   Date:  12/11/2016   ID:  Brandy [name], DOB Sep 29, 1955, MRN [id number]  PCP:  Coral Spikes, DO   Primary Electrophysiologist:  Dr Caryl Comes   Chief Complaint  Patient presents with  . Atrial Fibrillation     History of Present Illness: Brandy [name] is a 61 y.o. female who presents today for electrophysiology evaluation.   She has recurrent symptomatic persistent afib.   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  She was evaluated by Dr Caryl Comes and initiated on tikosyn.  Her EF has recovered.  She has done well.  Unfortunately, she continues to have afib.  Recent event monitor revealed both afib and atrial flutter.  She has palpitations.  She is referred for discussions regarding ablation.  Today, she denies symptoms of palpitations, chest pain, shortness of breath, orthopnea, PND, lower extremity edema, claudication, dizziness, presyncope, syncope, bleeding, or neurologic sequela. The patient is tolerating medications without difficulties and is otherwise without complaint today.    Past Medical History:  Diagnosis Date  . Chicken pox   . Chronic systolic CHF (congestive heart failure) ([place])    a. 03/2016 Echo: Ef 15-20%, diff HK, ant AK;  b. 05/2016 Echo: Ef 30-35%, diff HK, mildly dil LA/RA.  [name] History of kidney stones   . Moderate mitral regurgitation    a. 03/2016 Echo: mod MR in setting of LV dysfxn.  [name] NICM (nonischemic cardiomyopathy) ([place])    a. 03/2016 Echo: EF 15-20%, diff HK, ant AK, mod MR, mod dil LA, mildly dil RA;  b. 04/2016 Cath: nl cors;  c. 05/2016 Echo: EF 30-35%, diff HK.  . Obesity   . Obstructive sleep apnea    compliant with CPAP  . Persistent atrial fibrillation ([place])    a. Dx 03/2016;  b. CHA2DS2VASc = 2-->Eliquis 5mg  BID;  b. 05/2016 Failed DCCV x 4.   Past Surgical History:  Procedure Laterality Date  . CARDIAC CATHETERIZATION N/A 04/17/2016   Procedure: Left Heart Cath and Coronary Angiography;  Surgeon: Wellington Hampshire, MD;  Location: [facility];  Service: Cardiovascular;  Laterality: N/A;  . CHOLECYSTECTOMY    . ELECTROPHYSIOLOGIC STUDY N/A 05/26/2016   Procedure: Cardioversion;  Surgeon: Wellington Hampshire, MD;  Location: [facility];  Service: Cardiovascular;  Laterality: N/A;     Current Outpatient Prescriptions  Medication Sig Dispense Refill  . apixaban (ELIQUIS) 5 MG TABS tablet Take 1 tablet (5 mg total) by mouth 2 (two) times daily. 180 tablet 3  . Cholecalciferol (VITAMIN D3) 1000 units CAPS Take 1 capsule by mouth daily.    [name] dofetilide (TIKOSYN) 500 MCG capsule Take 1 capsule (500 mcg total) by mouth 2 (two) times daily. 180 capsule 3  . fluticasone (FLONASE) 50 MCG/ACT nasal spray Place into both nostrils 2 (two) times daily as needed for allergies or rhinitis.    . furosemide (LASIX) 20 MG tablet Take 20 mg by mouth daily as needed for fluid or edema.    [name] loratadine (CLARITIN) 10 MG tablet Take 10 mg by mouth daily as needed for allergies.     [name] losartan (COZAAR) 25 MG tablet Take 1 tablet (25 mg total) by mouth daily. 90 tablet 3  . metoprolol succinate (TOPROL-XL) 50 MG 24 hr tablet Take 1 tablet (50 mg total) by mouth daily. Take with or immediately following a meal. 90 tablet 3  . OIL OF OREGANO PO Take 1 application by mouth daily. To left big toe    .  potassium chloride SA (K-DUR,KLOR-CON) 20 MEQ tablet Take 1 tablet (20 mEq total) by mouth 2 (two) times daily. 60 tablet 6  . vitamin B-12 (CYANOCOBALAMIN) 1000 MCG tablet Take 1,000 mcg by mouth 2 (two) times daily.    . vitamin C (ASCORBIC ACID) 500 MG tablet Take 500 mg by mouth daily.     No current facility-administered medications for this visit.     Allergies:   Patient has no known allergies.   Social History:  The patient  reports that she has never smoked. She has never used smokeless tobacco. She reports that she does not drink alcohol or use drugs.   Family History:  The patient's  family history includes COPD  in her father and mother; Heart disease in her father.    ROS:  Please see the history of present illness.   All other systems are personally reviewed and negative.    PHYSICAL EXAM: VS:  BP 126/74   Pulse 62   Ht 5\' 7"  (1.702 m)   Wt 253 lb 9.6 oz (115 kg)   SpO2 97%   BMI 39.72 kg/m  , BMI Body mass index is 39.72 kg/m. GEN: overweight, in no acute distress  HEENT: normal  Neck: no JVD, carotid bruits, or masses Cardiac: RRR; no murmurs, rubs, or gallops,no edema  Respiratory:  clear to auscultation bilaterally, normal work of breathing GI: soft, nontender, nondistended, + BS MS: no deformity or atrophy  Skin: warm and dry  Neuro:  Strength and sensation are intact Psych: euthymic mood, full affect  EKG:  EKG is ordered today. The ekg ordered today is personally reviewed and shows sinus rhythm 62 bpm< PR 194 msec, QRS 76 msec, otherwise normal ekg   Recent Labs: 04/12/2016: B Natriuretic Peptide 297.0; TSH 3.040 11/30/2016: BUN 11; Creatinine, Ser 0.85; Hemoglobin 15.1; Magnesium 2.2; Platelets 292; Potassium 4.9; Sodium 142  personally reviewed   Lipid Panel     Component Value Date/Time   CHOL 191 10/31/2016 1457   TRIG 216.0 (H) 10/31/2016 1457   HDL 41.30 10/31/2016 1457   CHOLHDL 5 10/31/2016 1457   VLDL 43.2 (H) 10/31/2016 1457   LDLDIRECT 131.0 10/31/2016 1457   personally reviewed   Wt Readings from Last 3 Encounters:  12/11/16 253 lb 9.6 oz (115 kg)  11/30/16 250 lb 4 oz (113.5 kg)  10/31/16 248 lb 9.6 oz (112.8 kg)      Other studies personally reviewed: Additional studies/ records that were reviewed today include: Dr Aquilla Hacker notes, recent event monitor, echo, sleep study   Review of the above records today demonstrates: as above   ASSESSMENT AND PLAN:  1.  Persistent afib/ atrial flutter The patient has symptomatic atrial arrhythmias.  She has failed medical therapy with tikosyn. Therapeutic strategies for afib/ atrial flutter including  medicine and ablation were discussed in detail with the patient today. Risk, benefits, and alternatives to EP study and radiofrequency ablation for afib were also discussed in detail today. These risks include but are not limited to stroke, bleeding, vascular damage, tamponade, perforation, damage to the esophagus, lungs, and other structures, pulmonary vein stenosis, worsening renal function, and death. The patient understands these risk and wishes to think about this further.  She will contact our office if she wishes to proceed.  We would plan cardiac CT within 1 week of ablation to exclude LAA thrombus should she decide to proceed.  2. Obesity Body mass index is 39.72 kg/m.  I have reviewed the patients BMI  and decreased success rates with ablation at length today.  Weight loss is strongly advised.  Per Guijian et al (PACE 2013; 36: 161-096), patients with BMI 25-29.9 (obese) have a 27% increase in AF recurrence post ablation.  Patients with BMI >30 have a 31% increase in AF recurrence post ablation when compared to those with BMI <25. Lifestyle modification is encouraged  3. OSA Compliant with CPAP  4. Nonischemic CM Resolved with maintenance of sinus rhythm   Follow-up with Dr Caryl Comes as scheduled I am happy to see her again should she decide to proceed with ablation.  Current medicines are reviewed at length with the patient today.   The patient does not have concerns regarding her medicines.  The following changes were made today:  none  Labs/ tests ordered today include:  Orders Placed This Encounter  Procedures  . EKG 12-Lead     Signed, Thompson Grayer, MD  12/11/2016 4:10 PM     [address] [place] [address] [phone] (office) [phone] (fax)

## 2016-12-11 NOTE — Patient Instructions (Addendum)
Medication Instructions:  Your physician recommends that you continue on your current medications as directed. Please refer to the Current Medication list given to you today.   Labwork: none  Testing/Procedures: none  Follow-Up: Please call our office if/when you so decided to schedule your Atrial Fib ablation. Phone: [phone]   Any Other Special Instructions Will Be Listed Below (If Applicable).     If you need a refill on your cardiac medications before your next appointment, please call your pharmacy.

## 2016-12-12 ENCOUNTER — Telehealth: Payer: Self-pay

## 2016-12-12 NOTE — Telephone Encounter (Signed)
Pt is aware and agreeable to normal results. Aware that blood sugar has been trending on the elevated side and she said she is trying to work on getting that down

## 2017-02-19 ENCOUNTER — Encounter: Payer: Self-pay | Admitting: Internal Medicine

## 2017-04-02 ENCOUNTER — Ambulatory Visit: Payer: BC Managed Care – PPO | Admitting: Internal Medicine

## 2017-04-02 NOTE — Progress Notes (Deleted)
Patient Care Team: Coral Spikes, DO as PCP - General (Family Medicine)   HPI  Brandy [name] is a 61 y.o. female Seen in follow-up for atrial fibrillation for which dofetilide was started 2/18 with spontaneous conversion. She has a history of nonischemic cardiomyopathy with persistent left ventricular dysfunction occurring in the context of her atrial fibrillation with rapid and poorly controlled rates. The hope was that it was rate related. LV function has normalized as below  She continued with Palps; referred for consideration of ablation, but was not ready to make decision  Date Cr K Mg Hgb  3/18  0.76 3.9   14.5 (2/18)  4/18  0.76 4.2 2.0   5/18 0.83 4.9           DATE TEST    11/17 Echo   EF 15-20 %   1/18 Echo   EF 35 %   5/18 Echo EF 50-55% LA size-normal     6/18 LDL 124   Thromboembolic risk factors ( HTN-1, CHF-1, Gender-1) for a CHADSVASc Score of  3  Past Medical History:  Diagnosis Date  . Chicken pox   . Chronic systolic CHF (congestive heart failure) ([place])    a. 03/2016 Echo: Ef 15-20%, diff HK, ant AK;  b. 05/2016 Echo: Ef 30-35%, diff HK, mildly dil LA/RA.  [name] History of kidney stones   . Moderate mitral regurgitation    a. 03/2016 Echo: mod MR in setting of LV dysfxn.  [name] NICM (nonischemic cardiomyopathy) ([place])    a. 03/2016 Echo: EF 15-20%, diff HK, ant AK, mod MR, mod dil LA, mildly dil RA;  b. 04/2016 Cath: nl cors;  c. 05/2016 Echo: EF 30-35%, diff HK.  . Obesity   . Obstructive sleep apnea    compliant with CPAP  . Persistent atrial fibrillation ([place])    a. Dx 03/2016;  b. CHA2DS2VASc = 2-->Eliquis 5mg  BID;  b. 05/2016 Failed DCCV x 4.    Past Surgical History:  Procedure Laterality Date  . Cardioversion N/A 05/26/2016   Performed by Wellington Hampshire, MD at [facility]  . CHOLECYSTECTOMY    . Left Heart Cath and Coronary Angiography N/A 04/17/2016   Performed by Wellington Hampshire, MD at [place] [facility]    Current Outpatient Medications    Medication Sig Dispense Refill  . apixaban (ELIQUIS) 5 MG TABS tablet Take 1 tablet (5 mg total) by mouth 2 (two) times daily. 180 tablet 3  . Cholecalciferol (VITAMIN D3) 1000 units CAPS Take 1 capsule by mouth daily.    [name] dofetilide (TIKOSYN) 500 MCG capsule Take 1 capsule (500 mcg total) by mouth 2 (two) times daily. 180 capsule 3  . fluticasone (FLONASE) 50 MCG/ACT nasal spray Place into both nostrils 2 (two) times daily as needed for allergies or rhinitis.    . furosemide (LASIX) 20 MG tablet Take 20 mg by mouth daily as needed for fluid or edema.    [name] loratadine (CLARITIN) 10 MG tablet Take 10 mg by mouth daily as needed for allergies.     [name] losartan (COZAAR) 25 MG tablet Take 1 tablet (25 mg total) by mouth daily. 90 tablet 3  . metoprolol succinate (TOPROL-XL) 50 MG 24 hr tablet Take 1 tablet (50 mg total) by mouth daily. Take with or immediately following a meal. 90 tablet 3  . OIL OF OREGANO PO Take 1 application by mouth daily. To left big toe    .  potassium chloride SA (K-DUR,KLOR-CON) 20 MEQ tablet Take 1 tablet (20 mEq total) by mouth 2 (two) times daily. 60 tablet 6  . vitamin B-12 (CYANOCOBALAMIN) 1000 MCG tablet Take 1,000 mcg by mouth 2 (two) times daily.    . vitamin C (ASCORBIC ACID) 500 MG tablet Take 500 mg by mouth daily.     No current facility-administered medications for this visit.     No Known Allergies    Review of Systems negative except from HPI and PMH  Physical Exam There were no vitals taken for this visit. Well developed and nourished in no acute distress HENT normal Neck supple with JVP-flat Carotids brisk and full without bruits Clear Regular rate and rhythm, no murmurs or gallops Abd-soft with active BS without hepatomegaly No Clubbing cyanosis edema Skin-warm and dry A & Oriented  Grossly normal sensory and motor function y  ECG demonstrates sinus rhythm at 62 Intervals 16/07/45    Assessment and  Plan  Atrial  fibrillation-persistent  Cardiomyopathy-- nonischemic-rate related resolved  High Risk Medication Surveillance  Hypertension  Hypokalemia intermittent  Fatigue  Acidemia       Fatigue may be secondary to her beta blocker. We will start by decreasing the dose 100--50. There may be a bradycardic components of this which should attenuate. If it does not resolve with this down titration we'll try different beta blocker.   Current medicines are reviewed at length with the patient today .  The patient does not  have concerns regarding medicines.  She would like to consider catheter ablation. Her left atrial size is normal. She has treated  OSA.  We will arrange a consultation with Dr. [name]  We will continue her on dofetilide for now. She will continue her anticoagulation  No bleeding issues  we will check her CBC and her magnesium  We will also continue her beta blocker/ARB given her cardiomyopathy which was so severe   Her last metabolic profile had a low bicarbonate. We will recheck that today. That was an isolated event.

## 2017-04-03 ENCOUNTER — Ambulatory Visit: Payer: BC Managed Care – PPO | Admitting: Internal Medicine

## 2017-04-03 ENCOUNTER — Encounter: Payer: Self-pay | Admitting: Internal Medicine

## 2017-04-03 VITALS — BP 122/64 | HR 100 | Ht 67.0 in | Wt 258.4 lb

## 2017-04-03 DIAGNOSIS — I428 Other cardiomyopathies: Secondary | ICD-10-CM | POA: Diagnosis not present

## 2017-04-03 DIAGNOSIS — I481 Persistent atrial fibrillation: Secondary | ICD-10-CM | POA: Diagnosis not present

## 2017-04-03 DIAGNOSIS — I4819 Other persistent atrial fibrillation: Secondary | ICD-10-CM

## 2017-04-03 NOTE — Patient Instructions (Signed)
Medication Instructions: Your physician recommends that you continue on your current medications as directed. Please refer to the Current Medication list given to you today.  Labwork: Your physician has recommended that you have lab work today: Art gallery manager and Mag  Procedures/Testing: None Ordered  Follow-Up: Your physician recommends that you schedule a follow-up appointment in: 3 MONTHS and Brandy Palau, NP   Any Additional Special Instructions Will Be Listed Below (If Applicable).  AliveCor  FDA-cleared EKG at your fingertips. - [employer]   Agricultural engineer, [employer]. [email or web address]   FDA-cleared, clinical grade mobile EKG monitor: Brandy [name] is the most clinically-validated mobile EKG used by the world's leading cardiac care medical professionals.  This may be useful in monitoring palpitations.  We do not have access to have them emailed and reviewed but will be glad to review while in the office.      If you need a refill on your cardiac medications before your next appointment, please call your pharmacy.

## 2017-04-03 NOTE — Progress Notes (Signed)
Patient Care Team: Coral Spikes, DO as PCP - General (Family Medicine)   HPI  Brandy [name] is a 61 y.o. female Seen in follow-up for atrial fibrillation for which dofetilide was started 2/18 with spontaneous conversion. She has a history of nonischemic cardiomyopathy with persistent left ventricular dysfunction occurring in the context of her atrial fibrillation with rapid and poorly controlled rates. The hope was that it was rate related. LV function has normalized as below  She had recurrent episodes of atrial fibrillation.  She was referred to Dr. [name] to consider ablation.  It was elected to defer.  Date Cr K Mg Hgb  3/18  0.76 3.9   14.5 (2/18)  4/18  0.76 4.2 2.0   5/18 0.83 4.9           DATE TEST    11/17    Echo   EF 15-20 %   1/18  Echo   EF 35 %   5/18 Echo EF 50-55% LA size-normal     6/18 LDL 510   Thromboembolic risk factors ( HTN-1, CHF-1, Gender-1) for a CHADSVASc Score of  3  Past Medical History:  Diagnosis Date  . Chicken pox   . Chronic systolic CHF (congestive heart failure) ([place])    a. 03/2016 Echo: Ef 15-20%, diff HK, ant AK;  b. 05/2016 Echo: Ef 30-35%, diff HK, mildly dil LA/RA.  [name] History of kidney stones   . Moderate mitral regurgitation    a. 03/2016 Echo: mod MR in setting of LV dysfxn.  [name] NICM (nonischemic cardiomyopathy) ([place])    a. 03/2016 Echo: EF 15-20%, diff HK, ant AK, mod MR, mod dil LA, mildly dil RA;  b. 04/2016 Cath: nl cors;  c. 05/2016 Echo: EF 30-35%, diff HK.  . Obesity   . Obstructive sleep apnea    compliant with CPAP  . Persistent atrial fibrillation ([place])    a. Dx 03/2016;  b. CHA2DS2VASc = 2-->Eliquis 5mg  BID;  b. 05/2016 Failed DCCV x 4.    Past Surgical History:  Procedure Laterality Date  . CARDIAC CATHETERIZATION N/A 04/17/2016   Procedure: Left Heart Cath and Coronary Angiography;  Surgeon: Wellington Hampshire, MD;  Location: [place] [facility];  Service: Cardiovascular;  Laterality: N/A;  . CHOLECYSTECTOMY    .  ELECTROPHYSIOLOGIC STUDY N/A 05/26/2016   Procedure: Cardioversion;  Surgeon: Wellington Hampshire, MD;  Location: [facility] ORS;  Service: Cardiovascular;  Laterality: N/A;    Current Outpatient Medications  Medication Sig Dispense Refill  . apixaban (ELIQUIS) 5 MG TABS tablet Take 1 tablet (5 mg total) by mouth 2 (two) times daily. 180 tablet 3  . Cholecalciferol (VITAMIN D3) 1000 units CAPS Take 1 capsule by mouth 2 (two) times daily.     [name] dofetilide (TIKOSYN) 500 MCG capsule Take 1 capsule (500 mcg total) by mouth 2 (two) times daily. 180 capsule 3  . fluticasone (FLONASE) 50 MCG/ACT nasal spray Place into both nostrils 2 (two) times daily as needed for allergies or rhinitis.    . furosemide (LASIX) 20 MG tablet Take 20 mg by mouth daily as needed for fluid or edema.    [name] loratadine (CLARITIN) 10 MG tablet Take 10 mg by mouth daily as needed for allergies.     . OIL OF OREGANO PO Take 1 application by mouth daily. To left big toe    . potassium chloride SA (K-DUR,KLOR-CON) 20 MEQ tablet Take 1 tablet (20 mEq total) by  mouth 2 (two) times daily. 60 tablet 6  . vitamin B-12 (CYANOCOBALAMIN) 1000 MCG tablet Take 1,000 mcg by mouth 2 (two) times daily.    . vitamin C (ASCORBIC ACID) 500 MG tablet Take 500 mg by mouth daily.    [name] zinc gluconate 50 MG tablet Take 50 mg by mouth daily.    [name] losartan (COZAAR) 25 MG tablet Take 1 tablet (25 mg total) by mouth daily. 90 tablet 3  . metoprolol succinate (TOPROL-XL) 50 MG 24 hr tablet Take 1 tablet (50 mg total) by mouth daily. Take with or immediately following a meal. 90 tablet 3   No current facility-administered medications for this visit.     No Known Allergies    Review of Systems negative except from HPI and PMH  Physical Exam BP 122/64   Pulse 100   Ht 5\' 7"  (1.702 m)   Wt 258 lb 6.4 oz (117.2 kg)   SpO2 98%   BMI 40.47 kg/m  Well developed and nourished in no acute distress HENT normal Neck supple with JVP-flat Carotids brisk and full  without bruits Clear Regular rate and rhythm, no murmurs or gallops Abd-soft with active BS without hepatomegaly No Clubbing cyanosis edema Skin-warm and dry A & Oriented  Grossly normal sensory and motor function y  ECG demonstrates atrial fib @ 100 -/07/35   Assessment and  Plan  Atrial fibrillation-persistent  Cardiomyopathy-- nonischemic-rate related resolved  High Risk Medication Surveillance  Hypertension  Hypokalemia intermittent  Fatigue  Acidemia   OSA on therapy  Obesity    The patient turns out to not be able to distinguish atrial fibrillation.  We reviewed this at some length.  We will use AliveCor to help augment her ability to discern the presence or absence of atrial fibrillation.  If atrial fibrillation is persisting, we will undertake cardioversion.  If it is in fact paroxysmal, we will use ranolazine to augment therapy.  The issue of her weight which was quite bothersome when she saw Dr. Rayann Heman we reviewed with data from 2017.  BMI is of greater than 35 for persistent atrial fibrillation and greater than 40 for paroxysmal atrial fibrillation are associated with a significantly decreased effectiveness of catheter ablation  She is currently unable to exercise because of the atrial fibrillation.  Hopefully we can restore sinus rhythm and she can work on that.  Blood pressure is reasonably controlled.  We will check dofetilide surveillance laboratories.  More than 50% of 40  min was spent in counseling related to the above

## 2017-04-04 LAB — BASIC METABOLIC PANEL
BUN/Creatinine Ratio: 13 (ref 12–28)
BUN: 11 mg/dL (ref 8–27)
CO2: 24 mmol/L (ref 20–29)
Calcium: 10.1 mg/dL (ref 8.7–10.3)
Chloride: 103 mmol/L (ref 96–106)
Creatinine, Ser: 0.84 mg/dL (ref 0.57–1.00)
GFR calc Af Amer: 86 mL/min/{1.73_m2} (ref >59)
GFR calc non Af Amer: 75 mL/min/{1.73_m2} (ref >59)
Glucose: 94 mg/dL (ref 65–99)
Potassium: 4.3 mmol/L (ref 3.5–5.2)
Sodium: 141 mmol/L (ref 134–144)

## 2017-04-04 LAB — MAGNESIUM: Magnesium: 2.2 mg/dL (ref 1.6–2.3)

## 2017-04-09 ENCOUNTER — Encounter: Payer: Self-pay | Admitting: Internal Medicine

## 2017-04-12 ENCOUNTER — Telehealth: Payer: Self-pay | Admitting: Internal Medicine

## 2017-04-12 NOTE — Telephone Encounter (Signed)
New message     Patient calling to request Alive Core monitor order. Please call

## 2017-04-13 NOTE — Telephone Encounter (Signed)
Pt asking for prescription for AliveCor to turn into her insurance to have them cover it.  Advised pt normally insurance will not cover these.  Pt states she will just plan to write it off on her taxes then.  Pt appreciative for call.

## 2017-05-04 ENCOUNTER — Telehealth: Payer: Self-pay | Admitting: Internal Medicine

## 2017-05-04 NOTE — Telephone Encounter (Signed)
New message   Patient calling to confirm best method to send over her at home EKG results.  Please call

## 2017-05-04 NOTE — Telephone Encounter (Signed)
afib lasts 12-36 hours most instances.  Has been experiencing this often.  She was troubleshooting AliveCor device.  Asked about a referral code.  Advised there is a way she could upload attachment into her MyChart message to Dr. Caryl Comes but that we are not able to provide direction for this. Gave her customer service number for [employer].

## 2017-07-04 ENCOUNTER — Ambulatory Visit (HOSPITAL_COMMUNITY)
Admission: RE | Admit: 2017-07-04 | Discharge: 2017-07-04 | Disposition: A | Discharge status: Home / Self Care | Payer: BLUE CROSS/BLUE SHIELD | Source: Ambulatory Visit | Attending: Nurse Practitioner | Admitting: Nurse Practitioner

## 2017-07-04 ENCOUNTER — Encounter (HOSPITAL_COMMUNITY): Payer: Self-pay | Admitting: Nurse Practitioner

## 2017-07-04 VITALS — BP 108/68 | HR 72 | Ht 67.0 in | Wt 264.4 lb

## 2017-07-04 DIAGNOSIS — Z79899 Other long term (current) drug therapy: Secondary | ICD-10-CM | POA: Insufficient documentation

## 2017-07-04 DIAGNOSIS — I480 Paroxysmal atrial fibrillation: Secondary | ICD-10-CM | POA: Diagnosis not present

## 2017-07-04 DIAGNOSIS — G4733 Obstructive sleep apnea (adult) (pediatric): Secondary | ICD-10-CM | POA: Insufficient documentation

## 2017-07-04 DIAGNOSIS — Z9889 Other specified postprocedural states: Secondary | ICD-10-CM | POA: Insufficient documentation

## 2017-07-04 DIAGNOSIS — Z8619 Personal history of other infectious and parasitic diseases: Secondary | ICD-10-CM | POA: Diagnosis not present

## 2017-07-04 DIAGNOSIS — Z955 Presence of coronary angioplasty implant and graft: Secondary | ICD-10-CM | POA: Diagnosis not present

## 2017-07-04 DIAGNOSIS — Z7902 Long term (current) use of antithrombotics/antiplatelets: Secondary | ICD-10-CM | POA: Insufficient documentation

## 2017-07-04 DIAGNOSIS — Z9049 Acquired absence of other specified parts of digestive tract: Secondary | ICD-10-CM | POA: Diagnosis not present

## 2017-07-04 DIAGNOSIS — Z8249 Family history of ischemic heart disease and other diseases of the circulatory system: Secondary | ICD-10-CM | POA: Diagnosis not present

## 2017-07-04 DIAGNOSIS — I5022 Chronic systolic (congestive) heart failure: Secondary | ICD-10-CM | POA: Diagnosis not present

## 2017-07-04 DIAGNOSIS — I481 Persistent atrial fibrillation: Secondary | ICD-10-CM | POA: Insufficient documentation

## 2017-07-04 DIAGNOSIS — Z836 Family history of other diseases of the respiratory system: Secondary | ICD-10-CM | POA: Insufficient documentation

## 2017-07-04 DIAGNOSIS — E669 Obesity, unspecified: Secondary | ICD-10-CM | POA: Diagnosis not present

## 2017-07-04 DIAGNOSIS — Z6841 Body Mass Index (BMI) 40.0 and over, adult: Secondary | ICD-10-CM | POA: Insufficient documentation

## 2017-07-04 DIAGNOSIS — Z87442 Personal history of urinary calculi: Secondary | ICD-10-CM | POA: Insufficient documentation

## 2017-07-04 LAB — BASIC METABOLIC PANEL
Anion gap: 10 (ref 5–15)
BUN: 10 mg/dL (ref 6–20)
CO2: 24 mmol/L (ref 22–32)
Calcium: 9.7 mg/dL (ref 8.9–10.3)
Chloride: 104 mmol/L (ref 101–111)
Creatinine, Ser: 0.84 mg/dL (ref 0.44–1.00)
GFR calc Af Amer: >60 mL/min (ref >60)
GFR calc non Af Amer: >60 mL/min (ref >60)
Glucose, Bld: 150 mg/dL — ABNORMAL HIGH (ref 65–99)
Potassium: 4.1 mmol/L (ref 3.5–5.1)
Sodium: 138 mmol/L (ref 135–145)

## 2017-07-04 LAB — MAGNESIUM: Magnesium: 2.1 mg/dL (ref 1.7–2.4)

## 2017-07-04 NOTE — Progress Notes (Signed)
Primary Care Physician: Coral Spikes, DO Referring Physician:Dr. [name] is a 62 y.o. female with a h/o persistent atrial fibrillation that is in the atrial fibrillation clinic for f/u on Tikosyn therapy. Previous tachycardia mediated cardiomyopathy. Previous EF reduced at 35% but since in SR, EF has increased to 50-55%.  She reports that Brandy [name] is working fairly  well but has afib 1-2 times a week, usually responds well with metoprolol. She has seen Dr. Rayann Heman  In the past for possibility of ablation. He offered ablation but she deferred and he encouraged her to lose weight. She is still not quite ready to proceed to ablation.  Today, she denies symptoms of palpitations, chest pain, shortness of breath, orthopnea, PND, lower extremity edema, dizziness, presyncope, syncope, or neurologic sequela. The patient is tolerating medications without difficulties and is otherwise without complaint today.   Past Medical History:  Diagnosis Date  . Chicken pox   . Chronic systolic CHF (congestive heart failure) ([place])    a. 03/2016 Echo: Ef 15-20%, diff HK, ant AK;  b. 05/2016 Echo: Ef 30-35%, diff HK, mildly dil LA/RA.  [name] History of kidney stones   . Moderate mitral regurgitation    a. 03/2016 Echo: mod MR in setting of LV dysfxn.  [name] NICM (nonischemic cardiomyopathy) ([place])    a. 03/2016 Echo: EF 15-20%, diff HK, ant AK, mod MR, mod dil LA, mildly dil RA;  b. 04/2016 Cath: nl cors;  c. 05/2016 Echo: EF 30-35%, diff HK.  . Obesity   . Obstructive sleep apnea    compliant with CPAP  . Persistent atrial fibrillation ([place])    a. Dx 03/2016;  b. CHA2DS2VASc = 2-->Eliquis 5mg  BID;  b. 05/2016 Failed DCCV x 4.   Past Surgical History:  Procedure Laterality Date  . CARDIAC CATHETERIZATION N/A 04/17/2016   Procedure: Left Heart Cath and Coronary Angiography;  Surgeon: Wellington Hampshire, MD;  Location: [place] [facility];  Service: Cardiovascular;  Laterality: N/A;  . CHOLECYSTECTOMY    .  ELECTROPHYSIOLOGIC STUDY N/A 05/26/2016   Procedure: Cardioversion;  Surgeon: Wellington Hampshire, MD;  Location: [facility] ORS;  Service: Cardiovascular;  Laterality: N/A;    Current Outpatient Medications  Medication Sig Dispense Refill  . apixaban (ELIQUIS) 5 MG TABS tablet Take 1 tablet (5 mg total) by mouth 2 (two) times daily. 180 tablet 3  . dofetilide (TIKOSYN) 500 MCG capsule Take 1 capsule (500 mcg total) by mouth 2 (two) times daily. 180 capsule 3  . fluticasone (FLONASE) 50 MCG/ACT nasal spray Place into both nostrils 2 (two) times daily as needed for allergies or rhinitis.    . furosemide (LASIX) 20 MG tablet Take 20 mg by mouth daily as needed for fluid or edema.    [name] loratadine (CLARITIN) 10 MG tablet Take 10 mg by mouth daily as needed for allergies.     [name] losartan (COZAAR) 25 MG tablet Take 1 tablet (25 mg total) by mouth daily. 90 tablet 3  . magnesium gluconate (MAGONATE) 500 MG tablet Take 500 mg by mouth daily.    . metoprolol succinate (TOPROL-XL) 50 MG 24 hr tablet Take 1 tablet (50 mg total) by mouth daily. Take with or immediately following a meal. 90 tablet 3  . potassium chloride SA (K-DUR,KLOR-CON) 20 MEQ tablet Take 1 tablet (20 mEq total) by mouth 2 (two) times daily. 60 tablet 6  . zinc gluconate 50 MG tablet Take 50 mg by mouth daily.    [name]  Cholecalciferol (VITAMIN D3) 1000 units CAPS Take 1 capsule by mouth 2 (two) times daily.     . OIL OF OREGANO PO Take 1 application by mouth daily. To left big toe    . vitamin B-12 (CYANOCOBALAMIN) 1000 MCG tablet Take 1,000 mcg by mouth 2 (two) times daily.    . vitamin C (ASCORBIC ACID) 500 MG tablet Take 500 mg by mouth daily.     No current facility-administered medications for this encounter.     No Known Allergies  Social History   Socioeconomic History  . Marital status: Widowed    Spouse name: Not on file  . Number of children: Not on file  . Years of education: Not on file  . Highest education level: Not on file    Social Needs  . Financial resource strain: Not on file  . Food insecurity - worry: Not on file  . Food insecurity - inability: Not on file  . Transportation needs - medical: Not on file  . Transportation needs - non-medical: Not on file  Occupational History  . Not on file  Tobacco Use  . Smoking status: Never Smoker  . Smokeless tobacco: Never Used  Substance and Sexual Activity  . Alcohol use: No  . Drug use: No  . Sexual activity: Not on file  Other Topics Concern  . Not on file  Social History Narrative  . Not on file    Family History  Problem Relation Age of Onset  . COPD Mother   . COPD Father   . Heart disease Father   . Diabetes Neg Hx     ROS- All systems are reviewed and negative except as per the HPI above  Physical Exam: Vitals:   07/04/17 1329  BP: 108/68  Pulse: 72  Weight: 264 lb 6.4 oz (119.9 kg)  Height: 5\' 7"  (1.702 m)   Wt Readings from Last 3 Encounters:  07/04/17 264 lb 6.4 oz (119.9 kg)  04/03/17 258 lb 6.4 oz (117.2 kg)  12/11/16 253 lb 9.6 oz (115 kg)    Labs: Lab Results  Component Value Date   NA 138 07/04/2017   K 4.1 07/04/2017   CL 104 07/04/2017   CO2 24 07/04/2017   GLUCOSE 150 (H) 07/04/2017   BUN 10 07/04/2017   CREATININE 0.84 07/04/2017   CALCIUM 9.7 07/04/2017   MG 2.1 07/04/2017   Lab Results  Component Value Date   INR 1.0 05/16/2016   Lab Results  Component Value Date   CHOL 191 10/31/2016   HDL 41.30 10/31/2016   TRIG 216.0 (H) 10/31/2016     GEN- The patient is well appearing, alert and oriented x 3 today.   Head- normocephalic, atraumatic Eyes-  Sclera clear, conjunctiva pink Ears- hearing intact Oropharynx- clear Neck- supple, no JVP Lymph- no cervical lymphadenopathy Lungs- Clear to ausculation bilaterally, normal work of breathing Heart-regular rate and rhythm, no murmurs, rubs or gallops, PMI not laterally displaced GI- soft, NT, ND, + BS Extremities- no clubbing, cyanosis, or edema MS-  no significant deformity or atrophy Skin- no rash or lesion Psych- euthymic mood, full affect Neuro- strength and sensation are intact  EKG-NSR at 72 bpm, pr int 176 ms, qrs int 70 ms, qtc 394 ms Epic records reviewed Echo-Study Conclusions  Study Conclusions  - Left ventricle: The cavity size was normal. Systolic function was   normal. The estimated ejection fraction was in the range of 50%   to 55%. Wall motion was normal;  there were no regional wall   motion abnormalities. Doppler parameters are consistent with   abnormal left ventricular relaxation (grade 1 diastolic   dysfunction). - Left atrium: The atrium was normal in size. - Right ventricle: Systolic function was normal. - Pulmonary arteries: Systolic pressure was within the normal   range.  Impressions:  - Rhythm is NSR.   Assessment and Plan: 1. Persistent afib Continues with dofetilide 500 mcg bid  In SR  Qtc  stable Bmet/mag   Continue Eliquis without missed doses for chadsvasc score of 2   Pt continues to get  dofetilide thru assistance program Continue on metoprolol succinate 50 mg a day and can take an extra 1/2 dose  if needed for fast heart episodes Dr. Caryl Comes mentioned in his last note, if afib burden increases he may try ranolazine, but pt does not wish to make change today   F/u with Dr. Caryl Comes in 3 months Afib clinic as needed  Butch Penny C. Ihor Meinzer, [facility] [address] [place], [place] [address] [phone]

## 2017-08-23 ENCOUNTER — Other Ambulatory Visit (HOSPITAL_COMMUNITY): Payer: Self-pay | Admitting: Nurse Practitioner

## 2017-09-19 ENCOUNTER — Other Ambulatory Visit: Payer: Self-pay | Admitting: Internal Medicine

## 2017-09-21 ENCOUNTER — Other Ambulatory Visit: Payer: Self-pay | Admitting: Internal Medicine

## 2017-11-20 ENCOUNTER — Other Ambulatory Visit: Payer: Self-pay | Admitting: Internal Medicine

## 2017-12-26 ENCOUNTER — Other Ambulatory Visit (HOSPITAL_COMMUNITY): Payer: Self-pay | Admitting: Nurse Practitioner

## 2018-01-23 ENCOUNTER — Encounter: Payer: Self-pay | Admitting: Internal Medicine

## 2018-01-23 ENCOUNTER — Ambulatory Visit: Payer: BLUE CROSS/BLUE SHIELD | Admitting: Internal Medicine

## 2018-01-23 VITALS — BP 120/68 | HR 113 | Ht 67.0 in | Wt 267.4 lb

## 2018-01-23 DIAGNOSIS — I4891 Unspecified atrial fibrillation: Secondary | ICD-10-CM | POA: Diagnosis not present

## 2018-01-23 DIAGNOSIS — Z79899 Other long term (current) drug therapy: Secondary | ICD-10-CM

## 2018-01-23 DIAGNOSIS — I428 Other cardiomyopathies: Secondary | ICD-10-CM | POA: Diagnosis not present

## 2018-01-23 MED ORDER — METOPROLOL SUCCINATE ER 50 MG PO TB24: 100.0000 mg | ORAL_TABLET | Freq: Every evening | ORAL | 2 refills | Status: DC | PRN

## 2018-01-23 NOTE — Progress Notes (Signed)
Patient Care Team: Coral Spikes, DO as PCP - General (Family Medicine)   HPI  Brandy [name] is a 62 y.o. female Seen in follow-up for atrial fibrillation for which dofetilide was started 2/18 with spontaneous conversion. She has a history of nonischemic cardiomyopathy with persistent left ventricular dysfunction occurring in the context of her atrial fibrillation with rapid and poorly controlled rates. The hope was that it was rate related. LV function has normalized as below  She had recurrent episodes of atrial fibrillation.  She was referred to Dr. [name] to consider ablation.  It was elected to defer.   She continues to have episodes of atrial fibrillation which are associated with impaired exercise tolerance.  These can be particularly aggravated at night.  Her heart rate monitor notes when she is in A. fib that her sleeping rates are in the 110 range.  She awakens unrested.  No edema.  No chest pain.  It is her impression that her stomach is related to her heart and her lungs may be contributing as well.  She would like to take some essential oils and deal with her stomach issues to see if her atrial fibrillation can be attenuated prior to initiating on any further therapies.  No bleeding issues  Date Cr K Mg Hgb  3/18  0.76 3.9   14.5 (2/18)  4/18  0.76 4.2 2.0   5/18 0.83 4.9  15.1  2/19 0.84 4.1 2.1    DATE TEST    11/17    Echo   EF 15-20 %   1/18  Echo   EF 35 %   5/18 Echo EF 50-55% LA size-normal       Thromboembolic risk factors ( HTN-1, CHF-1, Gender-1) for a CHADSVASc Score of  3  Past Medical History:  Diagnosis Date  . Chicken pox   . Chronic systolic CHF (congestive heart failure) ([place])    a. 03/2016 Echo: Ef 15-20%, diff HK, ant AK;  b. 05/2016 Echo: Ef 30-35%, diff HK, mildly dil LA/RA.  [name] History of kidney stones   . Moderate mitral regurgitation    a. 03/2016 Echo: mod MR in setting of LV dysfxn.  [name] NICM (nonischemic cardiomyopathy) ([place])    a.  03/2016 Echo: EF 15-20%, diff HK, ant AK, mod MR, mod dil LA, mildly dil RA;  b. 04/2016 Cath: nl cors;  c. 05/2016 Echo: EF 30-35%, diff HK.  . Obesity   . Obstructive sleep apnea    compliant with CPAP  . Persistent atrial fibrillation ([place])    a. Dx 03/2016;  b. CHA2DS2VASc = 2-->Eliquis 5mg  BID;  b. 05/2016 Failed DCCV x 4.    Past Surgical History:  Procedure Laterality Date  . CARDIAC CATHETERIZATION N/A 04/17/2016   Procedure: Left Heart Cath and Coronary Angiography;  Surgeon: Wellington Hampshire, MD;  Location: [place] [facility];  Service: Cardiovascular;  Laterality: N/A;  . CHOLECYSTECTOMY    . ELECTROPHYSIOLOGIC STUDY N/A 05/26/2016   Procedure: Cardioversion;  Surgeon: Wellington Hampshire, MD;  Location: [facility];  Service: Cardiovascular;  Laterality: N/A;    Current Outpatient Medications  Medication Sig Dispense Refill  . Cholecalciferol (VITAMIN D3) 1000 units CAPS Take 1 capsule by mouth 2 (two) times daily.     [name] dofetilide (TIKOSYN) 500 MCG capsule Take 1 capsule (500 mcg total) by mouth 2 (two) times daily. 180 capsule 3  . ELIQUIS 5 MG TABS tablet TAKE 1 TABLET BY MOUTH  TWICE DAILY 60 tablet 5  . furosemide (LASIX) 20 MG tablet Take 20 mg by mouth daily as needed for fluid or edema.    [name] losartan (COZAAR) 25 MG tablet TAKE 1 TABLET BY MOUTH ONCE DAILY 90 tablet 1  . magnesium gluconate (MAGONATE) 500 MG tablet Take 500 mg by mouth daily.    . metoprolol succinate (TOPROL-XL) 50 MG 24 hr tablet TAKE 1 TABLET BY MOUTH ONCE DAILY TAKE  WITH  OR  IMMEDIATELY  FOLLOWING  A  MEAL 90 tablet 2  . OIL OF OREGANO PO Take 1 application by mouth daily. To left big toe    . potassium chloride SA (K-DUR,KLOR-CON) 20 MEQ tablet Take 1 tablet (20 mEq total) by mouth 2 (two) times daily. 60 tablet 6  . vitamin B-12 (CYANOCOBALAMIN) 1000 MCG tablet Take 1,000 mcg by mouth 2 (two) times daily.    . vitamin C (ASCORBIC ACID) 500 MG tablet Take 500 mg by mouth daily.    [name] zinc gluconate 50 MG  tablet Take 50 mg by mouth daily.     No current facility-administered medications for this visit.     No Known Allergies    Review of Systems negative except from HPI and PMH  Physical Exam BP 120/68   Pulse (!) 113   Ht 5\' 7"  (1.702 m)   Wt 267 lb 6.4 oz (121.3 kg)   SpO2 95%   BMI 41.88 kg/m  Well developed and nourished in no acute distress HENT normal Neck supple with JVP-flat Carotids brisk and full without bruits Clear Irregularly irregular rate and rhythm with rapid ventricular response, no murmurs or gallops Abd-soft with active BS without hepatomegaly No Clubbing cyanosis edema Skin-warm and dry A & Oriented  Grossly normal sensory and motor function   ECG demonstrates atrial fibrillation at 113 Intervals-/zero 8/32  Assessment and  Plan  Atrial fibrillation-persistent  Cardiomyopathy-- nonischemic-rate related resolved  High Risk Medication Surveillance  Hypertension  Hypokalemia intermittent  Fatigue  Acidemia   OSA on therapy  Obesity     OSA therapy is compliant  Her atrial fibrillation remains intermittent and problematic.  As noted above, she has a holistic approach that she would like to undertake prior to any further medication changes.  We will plan to see her in January  We will need to check her surveillance laboratories and her hemoglobin.  Given her nocturnal tachycardia, I suggested she take an extra metoprolol succinate at bedtime when she is in atrial fibrillation.  Blood pressure is reasonably controlled  We spent more than 50% of our >25 min visit in face to face counseling regarding the above    t

## 2018-01-23 NOTE — Patient Instructions (Signed)
Medication Instructions:  Your physician has recommended you make the following change in your medication:   1. Increase your metoprolol to 100mg , two tablets, as needed at night for periods of AFib  Labwork: Your physician recommends that you return for lab work at any [facility]: CBC, BMP, Mg  Testing/Procedures: None ordered.  Follow-Up: Your physician recommends that you schedule a follow-up appointment in: January with Dr Caryl Comes.   Any Other Special Instructions Will Be Listed Below (If Applicable).     If you need a refill on your cardiac medications before your next appointment, please call your pharmacy. [name]

## 2018-01-27 ENCOUNTER — Other Ambulatory Visit (HOSPITAL_COMMUNITY): Payer: Self-pay | Admitting: Nurse Practitioner

## 2018-01-28 NOTE — Telephone Encounter (Signed)
Pt's pharmacy is requesting a refill on potassium meq 20 BID. This medication was D/C. I do not see where a provider D/C this medication. Would Dr. Caryl Comes like to refill this medication? Please address

## 2018-02-11 NOTE — Telephone Encounter (Signed)
Continue p.o. potassium as per previously prescribed.  Patient Brandy [name] need basic metabolic when Dr. Caryl Comes returns.

## 2018-02-25 ENCOUNTER — Other Ambulatory Visit (HOSPITAL_COMMUNITY): Payer: Self-pay | Admitting: Internal Medicine

## 2018-03-07 ENCOUNTER — Telehealth: Payer: Self-pay | Admitting: Internal Medicine

## 2018-03-07 NOTE — Telephone Encounter (Signed)
New Message:    Pt would like for you to mail her a lab order please, she lost the one she was given at the office.

## 2018-03-14 ENCOUNTER — Other Ambulatory Visit: Payer: Self-pay | Admitting: Internal Medicine

## 2018-03-15 LAB — BASIC METABOLIC PANEL
BUN/Creatinine Ratio: 10 — ABNORMAL LOW (ref 12–28)
BUN: 10 mg/dL (ref 8–27)
CO2: 23 mmol/L (ref 20–29)
Calcium: 9.6 mg/dL (ref 8.7–10.3)
Chloride: 103 mmol/L (ref 96–106)
Creatinine, Ser: 0.96 mg/dL (ref 0.57–1.00)
GFR calc Af Amer: 73 mL/min/{1.73_m2} (ref >59)
GFR calc non Af Amer: 63 mL/min/{1.73_m2} (ref >59)
Glucose: 118 mg/dL — ABNORMAL HIGH (ref 65–99)
Potassium: 4.5 mmol/L (ref 3.5–5.2)
Sodium: 140 mmol/L (ref 134–144)

## 2018-03-15 LAB — CBC
Hematocrit: 40.1 % (ref 34.0–46.6)
Hemoglobin: 13.6 g/dL (ref 11.1–15.9)
MCH: 30.1 pg (ref 26.6–33.0)
MCHC: 33.9 g/dL (ref 31.5–35.7)
MCV: 89 fL (ref 79–97)
Platelets: 288 10*3/uL (ref 150–450)
RBC: 4.52 x10E6/uL (ref 3.77–5.28)
RDW: 12.4 % (ref 12.3–15.4)
WBC: 8.1 10*3/uL (ref 3.4–10.8)

## 2018-03-15 LAB — MAGNESIUM: Magnesium: 2.3 mg/dL (ref 1.6–2.3)

## 2018-04-01 ENCOUNTER — Encounter (HOSPITAL_COMMUNITY): Payer: Self-pay

## 2018-04-02 ENCOUNTER — Other Ambulatory Visit: Payer: Self-pay | Admitting: Internal Medicine

## 2018-04-10 ENCOUNTER — Telehealth (HOSPITAL_COMMUNITY): Payer: Self-pay | Admitting: *Deleted

## 2018-04-10 MED ORDER — DOFETILIDE 500 MCG PO CAPS: 500.0000 ug | ORAL_CAPSULE | Freq: Two times a day (BID) | ORAL | 3 refills | Status: DC

## 2018-04-10 NOTE — Telephone Encounter (Signed)
Patient called in today stating that at her last visit with Dr. Caryl Comes she discussed possibly stopping tikosyn as she feels her afib and stomach issues are related to one another. Patient states she will be stopping tikosyn when she runs out of tikosyn the end of this week. Re-educated patient that once she stops tikosyn she cannot resume as an outpatient she must go back into the hospital to be loaded. Also reminded patient that she may go back into afib once she stops tikosyn. Patient verbalized understanding and would still prefer to stop tikosyn at this time. Patient has a follow up appointment with Dr. Caryl Comes in early January but will call if issues/questions arise in the interim.

## 2018-05-09 ENCOUNTER — Encounter: Payer: Self-pay | Admitting: Internal Medicine

## 2018-05-09 ENCOUNTER — Ambulatory Visit: Payer: BLUE CROSS/BLUE SHIELD | Admitting: Internal Medicine

## 2018-05-09 VITALS — BP 126/80 | HR 95 | Ht 67.0 in | Wt 267.0 lb

## 2018-05-09 DIAGNOSIS — I4819 Other persistent atrial fibrillation: Secondary | ICD-10-CM | POA: Diagnosis not present

## 2018-05-09 DIAGNOSIS — I5022 Chronic systolic (congestive) heart failure: Secondary | ICD-10-CM | POA: Diagnosis not present

## 2018-05-09 DIAGNOSIS — I428 Other cardiomyopathies: Secondary | ICD-10-CM | POA: Diagnosis not present

## 2018-05-09 MED ORDER — METOPROLOL SUCCINATE ER 50 MG PO TB24: ORAL_TABLET | ORAL | 3 refills | Status: DC

## 2018-05-09 NOTE — Progress Notes (Signed)
Patient Care Team: Coral Spikes, DO as PCP - General (Family Medicine)   HPI  Brandy [name] is a 62 y.o. female Seen in follow-up for atrial fibrillation for which dofetilide was started 2/18 with spontaneous conversion. She has a history of nonischemic cardiomyopathy with persistent left ventricular dysfunction occurring in the context of her atrial fibrillation with rapid and poorly controlled rates. The hope was that it was rate related. LV function has normalized as below  She had recurrent episodes of atrial fibrillation.  She was referred to Dr. [name] to consider ablation.  It was elected to defer.   Now with permanent atrial fibrillation so have stopped dofetilide    Still with some rapid HR and DOE \  Exercise limited by hip pain    Has been working on portion cont    Date Cr K Mg Hgb  3/18  0.76 3.9   14.5 (2/18)  4/18  0.76 4.2 2.0   5/18 0.83 4.9  15.1  2/19 0.84 4.1 2.1   10/19 0.96 4.5 2.3 13.6     DATE TEST EF   11/17 Echo  15-20 %   1/18 Echo  35 %   5/18 Echo  50-55% LA size-normal       Thromboembolic risk factors ( HTN-1, CHF-1, Gender-1) for a CHADSVASc Score of  3  Past Medical History:  Diagnosis Date  . Chicken pox   . Chronic systolic CHF (congestive heart failure) ([place])    a. 03/2016 Echo: Ef 15-20%, diff HK, ant AK;  b. 05/2016 Echo: Ef 30-35%, diff HK, mildly dil LA/RA.  [name] History of kidney stones   . Moderate mitral regurgitation    a. 03/2016 Echo: mod MR in setting of LV dysfxn.  [name] NICM (nonischemic cardiomyopathy) ([place])    a. 03/2016 Echo: EF 15-20%, diff HK, ant AK, mod MR, mod dil LA, mildly dil RA;  b. 04/2016 Cath: nl cors;  c. 05/2016 Echo: EF 30-35%, diff HK.  . Obesity   . Obstructive sleep apnea    compliant with CPAP  . Persistent atrial fibrillation    a. Dx 03/2016;  b. CHA2DS2VASc = 2-->Eliquis 5mg  BID;  b. 05/2016 Failed DCCV x 4.    Past Surgical History:  Procedure Laterality Date  . CARDIAC CATHETERIZATION N/A  04/17/2016   Procedure: Left Heart Cath and Coronary Angiography;  Surgeon: Wellington Hampshire, MD;  Location: [place] [facility];  Service: Cardiovascular;  Laterality: N/A;  . CHOLECYSTECTOMY    . ELECTROPHYSIOLOGIC STUDY N/A 05/26/2016   Procedure: Cardioversion;  Surgeon: Wellington Hampshire, MD;  Location: [facility];  Service: Cardiovascular;  Laterality: N/A;    Current Outpatient Medications  Medication Sig Dispense Refill  . Cholecalciferol (VITAMIN D3) 1000 units CAPS Take 1 capsule by mouth 2 (two) times daily.     [name] ELIQUIS 5 MG TABS tablet TAKE 1 TABLET BY MOUTH TWICE DAILY 60 tablet 5  . furosemide (LASIX) 20 MG tablet Take 20 mg by mouth daily as needed for fluid or edema.    [name] losartan (COZAAR) 25 MG tablet TAKE 1 TABLET BY MOUTH ONCE DAILY 90 tablet 2  . magnesium gluconate (MAGONATE) 500 MG tablet Take 500 mg by mouth daily.    . metoprolol succinate (TOPROL-XL) 50 MG 24 hr tablet Take 2 tablets (100 mg total) by mouth at bedtime as needed. Take with or immediately following a meal. 90 tablet 2  . OIL OF OREGANO PO  Take 1 application by mouth daily. To left big toe    . potassium chloride SA (K-DUR,KLOR-CON) 20 MEQ tablet TAKE 1 TABLET BY MOUTH TWICE DAILY. NEED APPOINTMENT WITH Dantavious Snowball 60 tablet 11  . vitamin B-12 (CYANOCOBALAMIN) 1000 MCG tablet Take 1,000 mcg by mouth 2 (two) times daily.    . vitamin C (ASCORBIC ACID) 500 MG tablet Take 500 mg by mouth daily.    [name] zinc gluconate 50 MG tablet Take 50 mg by mouth daily.     No current facility-administered medications for this visit.     No Known Allergies    Review of Systems negative except from HPI and PMH  Physical Exam BP 126/80   Pulse 95   Ht 5\' 7"  (1.702 m)   Wt 267 lb (121.1 kg)   SpO2 98%   BMI 41.82 kg/m  Well developed and nourished in no acute distress HENT normal Neck supple with JVP-flat Carotids brisk and full without bruits Clear Irregularly irregular rate and rhythm with controlled  ventricular  response, no murmurs or gallops Abd-soft with active BS without hepatomegaly No Clubbing cyanosis edema Skin-warm and dry A & Oriented  Grossly normal sensory and motor function   Grossly normal sensory and motor function   ECG afib 95 -/08/32  Assessment and  Plan  Atrial fibrillation-persistent  Cardiomyopathy-- nonischemic-rate related resolved  High Risk Medication Surveillance  Hypertension  Fatigue   OSA on therapy  Obesity   Compliant with CPAP  Working on weight loss by portion control.  Have encouraged her to undertake exercise.  Has stopped dofetilide; we will stop potassium  Exercise tolerance may be limited by heart rate excursion; we will undertake treadmill testing.  In anticipation we will re-dose her metoprolol from 25/50--50/25.   We spent more than 50% of our >25 min visit in face to face counseling regarding the above

## 2018-05-09 NOTE — Patient Instructions (Addendum)
Medication Instructions:  Your physician has recommended you make the following change in your medication:   1.  Stop taking potassium  2.  Change how you take your Toprol XL 50 mg tablets-- Take 50 mg tablet in the AM and 25 mg (1/2 tablet) in the PM  Labwork: None ordered.  Testing/Procedures: Your physician has requested that you have an exercise tolerance test. For further information please visit [email or web address]. Please also follow instruction sheet, as given.  Please schedule for treadmill stress test in [place].  Follow-Up: Your physician wants you to follow-up in: one year with Dr. Caryl Comes EP APP. You will receive a reminder letter in the mail two months in advance. If you don't receive a letter, please call our office to schedule the follow-up appointment.  Any Other Special Instructions Will Be Listed Below (If Applicable).  If you need a refill on your cardiac medications before your next appointment, please call your pharmacy.

## 2018-05-21 ENCOUNTER — Ambulatory Visit: Payer: BLUE CROSS/BLUE SHIELD | Admitting: Internal Medicine

## 2018-05-21 ENCOUNTER — Ambulatory Visit: Payer: BLUE CROSS/BLUE SHIELD

## 2018-05-21 ENCOUNTER — Encounter: Payer: Self-pay | Admitting: Internal Medicine

## 2018-05-21 VITALS — BP 103/72 | HR 123 | Ht 67.0 in | Wt 265.0 lb

## 2018-05-21 DIAGNOSIS — I4819 Other persistent atrial fibrillation: Secondary | ICD-10-CM | POA: Diagnosis not present

## 2018-05-21 MED ORDER — METOPROLOL SUCCINATE ER 100 MG PO TB24: 100.0000 mg | ORAL_TABLET | Freq: Every day | ORAL | Status: DC

## 2018-05-21 MED ORDER — METOPROLOL SUCCINATE ER 100 MG PO TB24: 100.0000 mg | ORAL_TABLET | Freq: Every day | ORAL | 3 refills | Status: DC

## 2018-05-21 NOTE — Patient Instructions (Addendum)
Medication Instructions:  - Your physician has recommended you make the following change in your medication:   1) Metoprolol succinate 100 mg- take 1 tablet by mouth every morning  2) STOP losartan  If you need a refill on your cardiac medications before your next appointment, please call your pharmacy.   Lab work: - none ordered  If you have labs (blood work) drawn today and your tests are completely normal, you will receive your results only by: [name] MyChart Message (if you have MyChart) OR . A paper copy in the mail If you have any lab test that is abnormal or we need to change your treatment, we will call you to review the results.  Testing/Procedures: - none ordered  Follow-Up: At [facility], you and your health needs are our priority.  As part of our continuing mission to provide you with exceptional heart care, we have created designated Provider Care Teams.  These Care Teams include your primary Cardiologist (physician) and Advanced Practice Providers (APPs -  Physician Assistants and Nurse Practitioners) who all work together to provide you with the care you need, when you need it. . 3 months wtih Dr. Caryl Comes  Any Other Special Instructions Will Be Listed Below (If Applicable). - N/A

## 2018-05-21 NOTE — Progress Notes (Signed)
Patient Care Team: Coral Spikes, DO as PCP - General (Family Medicine)   HPI  Brandy [name] is a 64 y.o. female Seen in follow-up for atrial fibrillation for which dofetilide was started 2/18 with spontaneous conversion. She has a history of nonischemic cardiomyopathy with persistent left ventricular dysfunction occurring in the context of her atrial fibrillation with rapid and poorly controlled rates. The hope was that it was rate related. LV function has normalized as below  She had recurrent episodes of atrial fibrillation.  She was referred to Dr. [name] to consider ablation.  It was elected to defer.   Now with permanent atrial fibrillation so have stopped dofetilide    Still with some rapid HR and DOE \  Exercise limited by hip pain       Date Cr K Mg Hgb  3/18  0.76 3.9   14.5 (2/18)  4/18  0.76 4.2 2.0   5/18 0.83 4.9  15.1  2/19 0.84 4.1 2.1   10/19 0.96 4.5 2.3 13.6     DATE TEST EF   11/17 Echo  15-20 %   1/18 Echo  35 %   5/18 Echo  50-55% LA size-normal    A little bit of interval weight loss.  Has been feeling much better on the metoprolol.  Came for treadmill testing today.  Resting heart rate was in the 110-20 range.  Canceled.   Thromboembolic risk factors ( HTN-1, CHF-1, Gender-1) for a CHADSVASc Score of  3  Past Medical History:  Diagnosis Date  . Chicken pox   . Chronic systolic CHF (congestive heart failure) ([place])    a. 03/2016 Echo: Ef 15-20%, diff HK, ant AK;  b. 05/2016 Echo: Ef 30-35%, diff HK, mildly dil LA/RA.  [name] History of kidney stones   . Moderate mitral regurgitation    a. 03/2016 Echo: mod MR in setting of LV dysfxn.  [name] NICM (nonischemic cardiomyopathy) ([place])    a. 03/2016 Echo: EF 15-20%, diff HK, ant AK, mod MR, mod dil LA, mildly dil RA;  b. 04/2016 Cath: nl cors;  c. 05/2016 Echo: EF 30-35%, diff HK.  . Obesity   . Obstructive sleep apnea    compliant with CPAP  . Persistent atrial fibrillation    a. Dx 03/2016;  b.  CHA2DS2VASc = 2-->Eliquis 5mg  BID;  b. 05/2016 Failed DCCV x 4.    Past Surgical History:  Procedure Laterality Date  . CARDIAC CATHETERIZATION N/A 04/17/2016   Procedure: Left Heart Cath and Coronary Angiography;  Surgeon: Wellington Hampshire, MD;  Location: [place] [facility];  Service: Cardiovascular;  Laterality: N/A;  . CHOLECYSTECTOMY    . ELECTROPHYSIOLOGIC STUDY N/A 05/26/2016   Procedure: Cardioversion;  Surgeon: Wellington Hampshire, MD;  Location: [facility];  Service: Cardiovascular;  Laterality: N/A;    Current Outpatient Medications  Medication Sig Dispense Refill  . Cholecalciferol (VITAMIN D3) 1000 units CAPS Take 1 capsule by mouth 2 (two) times daily.     [name] ELIQUIS 5 MG TABS tablet TAKE 1 TABLET BY MOUTH TWICE DAILY 60 tablet 5  . furosemide (LASIX) 20 MG tablet Take 20 mg by mouth daily as needed for fluid or edema.    [name] losartan (COZAAR) 25 MG tablet TAKE 1 TABLET BY MOUTH ONCE DAILY 90 tablet 2  . magnesium gluconate (MAGONATE) 500 MG tablet Take 500 mg by mouth daily.    . metoprolol succinate (TOPROL XL) 50 MG 24 hr tablet Take  50 mg in the morning and 25 mg in the evening 135 tablet 3  . OIL OF OREGANO PO Take 1 application by mouth daily. To left big toe    . vitamin C (ASCORBIC ACID) 500 MG tablet Take 500 mg by mouth daily.    . vitamin B-12 (CYANOCOBALAMIN) 1000 MCG tablet Take 1,000 mcg by mouth 2 (two) times daily.    [name] zinc gluconate 50 MG tablet Take 50 mg by mouth daily.     No current facility-administered medications for this visit.     No Known Allergies    Review of Systems negative except from HPI and PMH  Physical Exam BP 103/72 (BP Location: Left Arm, Patient Position: Standing, Cuff Size: Normal)   Pulse (!) 123   Ht 5\' 7"  (1.702 m)   Wt 265 lb (120.2 kg)   BMI 41.50 kg/m  Well developed and nourished in no acute distress HENT normal Neck supple with JVP-flat Carotids brisk and full without bruits Clear Irregularly irregular rate and rhythm  with controlled ventricular response, no murmurs or gallops Abd-soft with active BS without hepatomegaly No Clubbing cyanosis edema Skin-warm and dry A & Oriented  Grossly normal sensory and motor function sly normal sensory and motor function   ECG afib 123 Assessment and  Plan  Atrial fibrillation-persistent  Cardiomyopathy-- nonischemic-rate related resolved  High Risk Medication Surveillance  Hypertension  OSA on therapy  Obesity    She has felt better on the higher dose of metoprolol.  Heart rate excursion may be better; we will decrease her discontinue her losartan and increase her metoprolol to 75--100 mg a day.  Blood pressure is well controlled

## 2018-05-28 ENCOUNTER — Ambulatory Visit: Payer: BLUE CROSS/BLUE SHIELD | Admitting: Internal Medicine

## 2018-07-14 IMAGING — DX DG CHEST 1V PORT
1 series · 1 of 1 positions shown · non-contrast
Comparison: Two-view chest x-ray 07/31/2011.

CLINICAL DATA: Bronchitis.  Shortness of breath.

EXAM:
PORTABLE CHEST 1 VIEW

[chest ap]
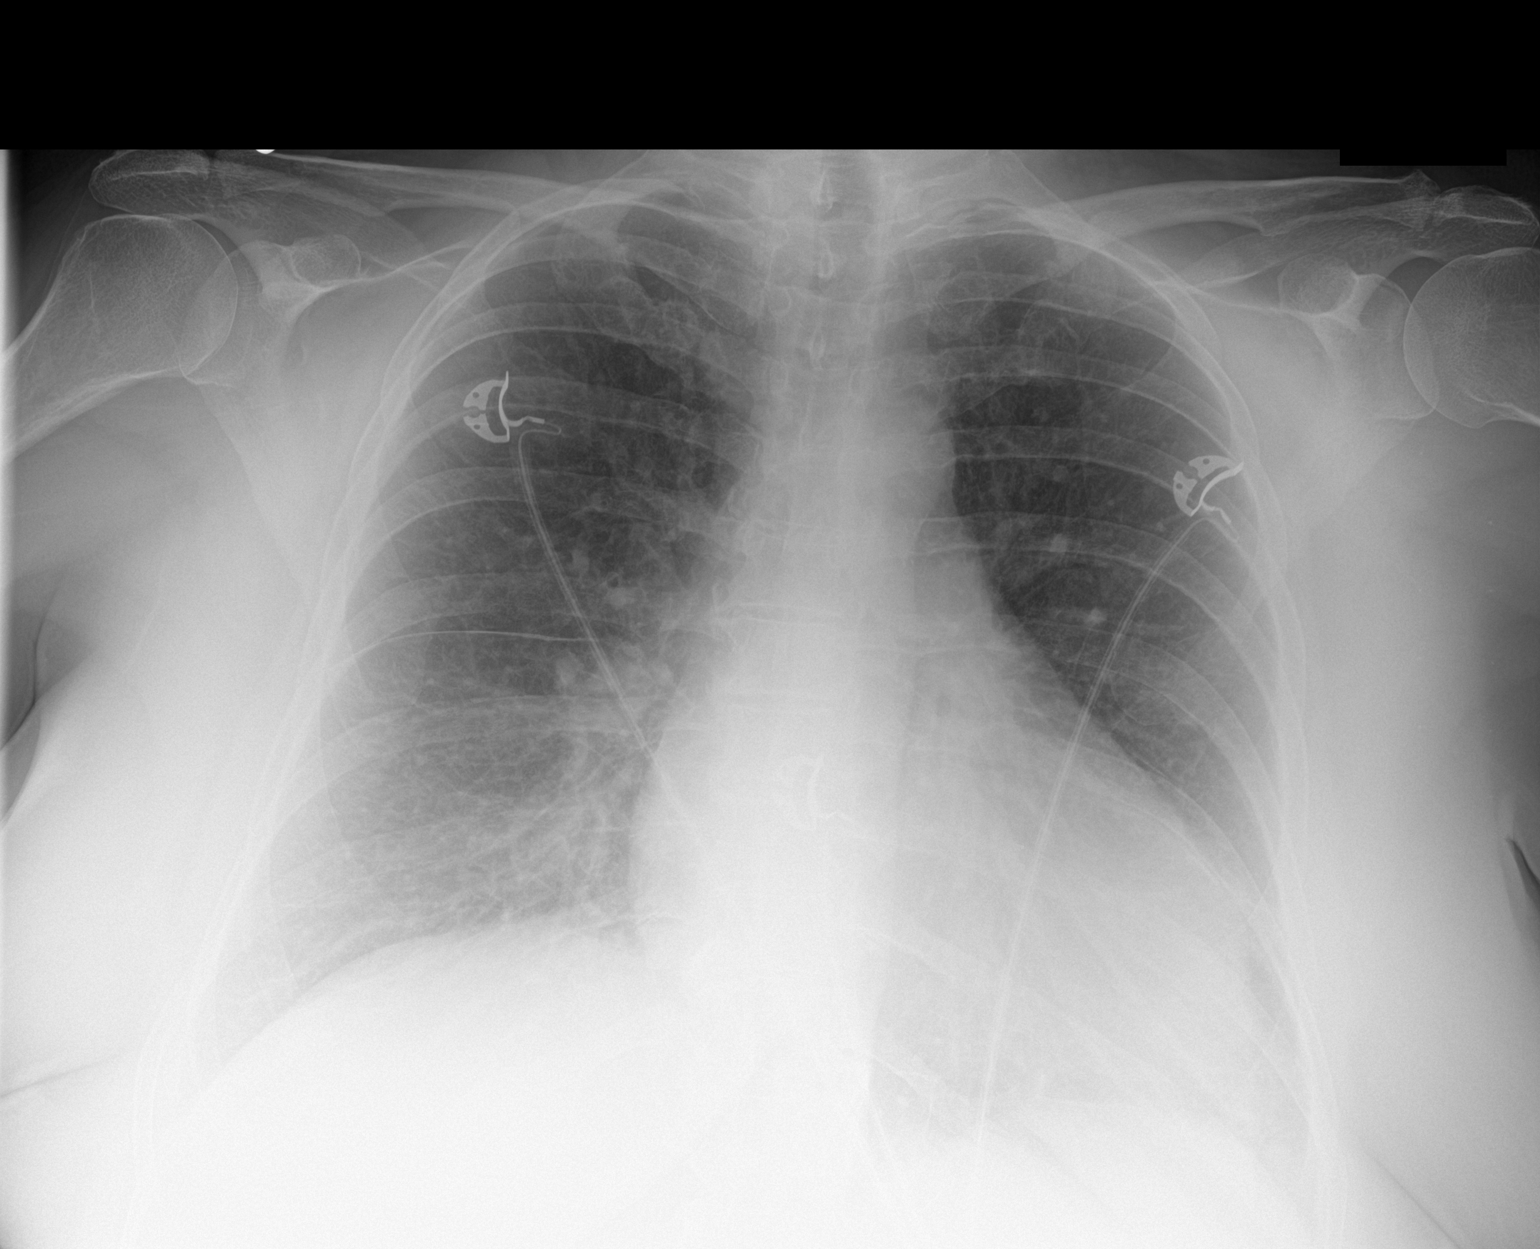

[1 of 1 positions shown; findings below may reference images not displayed]

FINDINGS: The heart size is exaggerated by low lung volumes. Moderate
pulmonary vascular congestion and mild edema are present. Bibasilar
airspace disease likely reflects atelectasis. The visualized soft
tissues and bony thorax are unremarkable.
IMPRESSION: 1. Borderline cardiomegaly with moderate pulmonary vascular
congestion and mild bilateral edema.
2. Mild bibasilar airspace disease likely reflects atelectasis.

## 2018-09-05 ENCOUNTER — Telehealth: Payer: Self-pay

## 2018-09-05 NOTE — Telephone Encounter (Signed)
Virtual Visit Pre-Appointment Phone Call   Confirm consent - "In the setting of the current Covid19 crisis, you are scheduled for a (phone or video) visit with your provider on (date) at (time).  Just as we do with many in-office visits, in order for you to participate in this visit, we must obtain consent.  If you'd like, I can send this to your mychart (if signed up) or email for you to review.  Otherwise, I can obtain your verbal consent now.  All virtual visits are billed to your insurance company just like a normal visit would be.  By agreeing to a virtual visit, we'd like you to understand that the technology does not allow for your provider to perform an examination, and thus may limit your provider's ability to fully assess your condition. If your provider identifies any concerns that need to be evaluated in person, we will make arrangements to do so.  Finally, though the technology is pretty good, we cannot assure that it will always work on either your or our end, and in the setting of a video visit, we may have to convert it to a phone-only visit.  In either situation, we cannot ensure that we have a secure connection.  Are you willing to proceed?"  YES   TELEPHONE CALL NOTE  Brandy [name] has been deemed a candidate for a follow-up tele-health visit to limit community exposure during the Covid-19 pandemic. I spoke with the patient via phone to ensure availability of phone/video source, confirm preferred email & phone number, and discuss instructions and expectations.  I reminded Brandy [name] to be prepared with any vital sign and/or heart rhythm information that could potentially be obtained via home monitoring, at the time of her visit. I reminded Brandy [name] to expect a phone call prior to her visit.  Alba Destine, RMA 09/05/2018 10:04 AM  IF USING DOXIMITY or DOXY.ME - The patient will receive a link just prior to their visit by text.     FULL LENGTH CONSENT FOR TELE-HEALTH  VISIT   I hereby voluntarily request, consent and authorize [facility] and its employed or contracted physicians, physician assistants, nurse practitioners or other licensed health care professionals (the Practitioner), to provide me with telemedicine health care services (the "Services") as deemed necessary by the treating Practitioner. I acknowledge and consent to receive the Services by the Practitioner via telemedicine. I understand that the telemedicine visit will involve communicating with the Practitioner through live audiovisual communication technology and the disclosure of certain medical information by electronic transmission. I acknowledge that I have been given the opportunity to request an in-person assessment or other available alternative prior to the telemedicine visit and am voluntarily participating in the telemedicine visit.  I understand that I have the right to withhold or withdraw my consent to the use of telemedicine in the course of my care at any time, without affecting my right to future care or treatment, and that the Practitioner or I may terminate the telemedicine visit at any time. I understand that I have the right to inspect all information obtained and/or recorded in the course of the telemedicine visit and may receive copies of available information for a reasonable fee.  I understand that some of the potential risks of receiving the Services via telemedicine include:  [name] Delay or interruption in medical evaluation due to technological equipment failure or disruption; . Information transmitted may not be sufficient (e.g. poor resolution of images)  to allow for appropriate medical decision making by the Practitioner; and/or  . In rare instances, security protocols could fail, causing a breach of personal health information.  Furthermore, I acknowledge that it is my responsibility to provide information about my medical history, conditions and care that is complete and  accurate to the best of my ability. I acknowledge that Practitioner's advice, recommendations, and/or decision may be based on factors not within their control, such as incomplete or inaccurate data provided by me or distortions of diagnostic images or specimens that may result from electronic transmissions. I understand that the practice of medicine is not an exact science and that Practitioner makes no warranties or guarantees regarding treatment outcomes. I acknowledge that I will receive a copy of this consent concurrently upon execution via email to the email address I last provided but may also request a printed copy by calling the office of [facility].    I understand that my insurance will be billed for this visit.   I have read or had this consent read to me. . I understand the contents of this consent, which adequately explains the benefits and risks of the Services being provided via telemedicine.  . I have been provided ample opportunity to ask questions regarding this consent and the Services and have had my questions answered to my satisfaction. . I give my informed consent for the services to be provided through the use of telemedicine in my medical care  By participating in this telemedicine visit I agree to the above.

## 2018-09-19 ENCOUNTER — Telehealth (INDEPENDENT_AMBULATORY_CARE_PROVIDER_SITE_OTHER): Payer: BLUE CROSS/BLUE SHIELD | Admitting: Internal Medicine

## 2018-09-19 ENCOUNTER — Other Ambulatory Visit: Payer: Self-pay

## 2018-09-19 VITALS — BP 117/75 | HR 96 | Wt 264.0 lb

## 2018-09-19 DIAGNOSIS — I480 Paroxysmal atrial fibrillation: Secondary | ICD-10-CM

## 2018-09-19 NOTE — Progress Notes (Signed)
Electrophysiology TeleHealth Note   Due to national recommendations of social distancing due to COVID 19, an audio/video telehealth visit is felt to be most appropriate for this patient at this time.  See MyChart message from today for the patient's consent to telehealth for [name].   Date:  09/19/2018   ID:  Brandy [name], DOB 12/29/1955, MRN [id number]  Location: patient's home  Provider location: [address] [place]  Evaluation Performed: Follow-up visit  PCP:  Coral Spikes, DO  Cardiologist:   * Electrophysiologist:  SK   Chief Complaint: atrial fibrillation   History of Present Illness:    Brandy [name] is a 63 y.o. female who presents via audio/video conferencing for a telehealth visit today.  Since last being seen in our clinic for permanent atrial fibrillation nonischemic cardiomyopathy with interval resolution, anticoagulation with apixoban   the patient reports mostly doing really quite well.  However, has noted increasing fatigue over the last few days.  This is unassociated with systemic symptoms peripheral edema orthopnea.  She does use CPAP; which she has not recently been read.  More significantly, however, she has noted that her heart rates are faster.  Today on her AliveCor band heart rates were recorded greater than 130.  They have been settled down into the high 90s.  The patient denies chest pain,  nocturnal dyspnea, orthopnea or peripheral edema.  There have been no   lightheadedness or syncope.    Labs See Below   The patient denies symptoms of fevers, chills, cough, or new SOB worrisome for COVID 19.    Past Medical History:  Diagnosis Date  . Chicken pox   . Chronic systolic CHF (congestive heart failure) ([place])    a. 03/2016 Echo: Ef 15-20%, diff HK, ant AK;  b. 05/2016 Echo: Ef 30-35%, diff HK, mildly dil LA/RA.  [name] History of kidney stones   . Moderate mitral regurgitation    a. 03/2016 Echo: mod MR in setting of LV dysfxn.  [name]  NICM (nonischemic cardiomyopathy) ([place])    a. 03/2016 Echo: EF 15-20%, diff HK, ant AK, mod MR, mod dil LA, mildly dil RA;  b. 04/2016 Cath: nl cors;  c. 05/2016 Echo: EF 30-35%, diff HK.  . Obesity   . Obstructive sleep apnea    compliant with CPAP  . Persistent atrial fibrillation    a. Dx 03/2016;  b. CHA2DS2VASc = 2-->Eliquis 5mg  BID;  b. 05/2016 Failed DCCV x 4.    Past Surgical History:  Procedure Laterality Date  . CARDIAC CATHETERIZATION N/A 04/17/2016   Procedure: Left Heart Cath and Coronary Angiography;  Surgeon: Wellington Hampshire, MD;  Location: [facility];  Service: Cardiovascular;  Laterality: N/A;  . CHOLECYSTECTOMY    . ELECTROPHYSIOLOGIC STUDY N/A 05/26/2016   Procedure: Cardioversion;  Surgeon: Wellington Hampshire, MD;  Location: [facility];  Service: Cardiovascular;  Laterality: N/A;    Current Outpatient Medications  Medication Sig Dispense Refill  . Cholecalciferol (VITAMIN D3) 1000 units CAPS Take 1 capsule by mouth 2 (two) times daily.     [name] ELIQUIS 5 MG TABS tablet TAKE 1 TABLET BY MOUTH TWICE DAILY 60 tablet 5  . furosemide (LASIX) 20 MG tablet Take 20 mg by mouth daily as needed for fluid or edema.    . magnesium gluconate (MAGONATE) 500 MG tablet Take 500 mg by mouth daily.    . metoprolol succinate (TOPROL-XL) 100 MG 24 hr tablet Take 1  tablet (100 mg total) by mouth daily. Take with or immediately following a meal. 90 tablet 3  . OIL OF OREGANO PO Take 1 application by mouth daily. To left big toe    . vitamin B-12 (CYANOCOBALAMIN) 1000 MCG tablet Take 1,000 mcg by mouth 2 (two) times daily.    . vitamin C (ASCORBIC ACID) 500 MG tablet Take 500 mg by mouth daily.    [name] zinc gluconate 50 MG tablet Take 50 mg by mouth daily.     No current facility-administered medications for this visit.     Allergies:   Patient has no known allergies.   Social History:  The patient  reports that she has never smoked. She has never used smokeless tobacco. She reports that  she does not drink alcohol or use drugs.   Family History:  The patient's   family history includes COPD in her father and mother; Heart disease in her father.   ROS:  Please see the history of present illness.   All other systems are personally reviewed and negative.    Exam:    Vital Signs:  BP 117/75 (BP Location: Left Arm, Patient Position: Sitting, Cuff Size: Normal)   Pulse 96   Wt 264 lb (119.7 kg)   BMI 41.35 kg/m     Well appearing, alert and conversant, regular work of breathing,  good skin color Eyes- anicteric, neuro- grossly intact, skin- no apparent rash or lesions or cyanosis, mouth- oral mucosa is pink   Labs/Other Tests and Data Reviewed:    Recent Labs: 03/14/2018: BUN 10; Creatinine, Ser 0.96; Hemoglobin 13.6; Magnesium 2.3; Platelets 288; Potassium 4.5; Sodium 140  Personally reviewed    Wt Readings from Last 3 Encounters:  09/19/18 264 lb (119.7 kg)  05/21/18 265 lb (120.2 kg)  05/09/18 267 lb (121.1 kg)     Other studies personally reviewed: Additional studies/ records that were reviewed today include: As above   Review of the above records today demonstrates: As above       ASSESSMENT & PLAN:   Atrial fibrillation permanent  Dyspnea on exertion  Nonischemic cardiomyopathy, interval resolution  Morbidly obese   The patient's resting heart rate and AliveCor readings are concerning related to adequacy of heart rate control.  Reviewing her monitor 7/18, sinus rates were slow but atrial fibrillation rates were 100-120; both these data points remained less of a concern of heart rate control.  It is hard to imagine that this is responsible for her acute deterioration; however, she has been extremely vigorous of late.  It may just be fatigue.  Degree of tachycardia is of heightened concern in the context of resolved cardiomyopathy.  Data have shown a high degree of susceptibility for recurrent LV dysfunction in patients with hsitory of rate related  myopathy;  I am not aware of data that this would apply to other causes of resolved nonischemic myopathy. Yet the concern is heightened  5-day ZIO monitor  Rate control decisions thereafter,  We will have a low threshold for obtaining an echo to look at LV function which we did for medication options both for rate control as well as risk reduction  COVID 19 screen The patient denies symptoms of COVID 19 at this time.  The importance of social distancing was discussed today.  Follow-up:  87m    Current medicines are reviewed at length with the patient today.   The patient has concerns regarding her medicines: Whether her metoprolol dose is sufficient for rate  control .  The following changes were made today none  Labs/ tests ordered today include:ZIO patch x 5 d   No orders of the defined types were placed in this encounter.    s  Patient Risk:  after full review of this patients clinical status, I feel that they are at moderate  risk at this time.  Today, I have spent 18  minutes with the patient with telehealth technology discussing the above.  Signed, Virl Axe, MD  09/19/2018 10:45 AM     [facility] [address] [place] [address] [phone] (office) [phone] (fax)

## 2018-09-19 NOTE — Patient Instructions (Signed)
Medication Instructions:  - Your physician recommends that you continue on your current medications as directed. Please refer to the Current Medication list given to you today.  If you need a refill on your cardiac medications before your next appointment, please call your pharmacy.   Lab work: - none ordered  If you have labs (blood work) drawn today and your tests are completely normal, you will receive your results only by: [name] MyChart Message (if you have MyChart) OR . A paper copy in the mail If you have any lab test that is abnormal or we need to change your treatment, we will call you to review the results.  Testing/Procedures: - Your physician has recommended that you wear a 5 day heart monitor (ZIO patch) - This will be mailed directly to your home address - You may receive a call directly from the company for [place], which is [employer] within the next few day- if you see an 800# or 224# calling, please answer - Once the monitor is applied and activated it will record your heart rate/ rhythm for the duration that you are wearing it - If you are having any symptoms (dizziness/ lightheadedness/ palpitations/ racing heart/ anything that just doesn't feel right) then you will push the button in the center of the monitor to mark you were having a symptom - Please DO NOT shower for 24 hours after the monitor is placed - No tub baths/ swimming pools/ hot tubs while wearing the monitor - Do not put any lotions, oils, or ointments around the monitor - If you have any issues with the monitor itself, then please call the 800 # for the company - After 5 days please take the monitor off at home and mail it back ([place] mail) in the box provided by [employer], postage is already paid.   Follow-Up: At [facility], you and your health needs are our priority.  As part of our continuing mission to provide you with exceptional heart care, we have created designated Provider Care Teams.  These Care Teams  include your primary Cardiologist (physician) and Advanced Practice Providers (APPs -  Physician Assistants and Nurse Practitioners) who all work together to provide you with the care you need, when you need it.  You will need a follow up appointment in 6 months (November) with Dr. Caryl Comes. Please call our office 2 months in advance to schedule this appointment. (call the office in early September to schedule)   Any Other Special Instructions Will Be Listed Below (If Applicable). - N/A

## 2018-09-28 ENCOUNTER — Ambulatory Visit (INDEPENDENT_AMBULATORY_CARE_PROVIDER_SITE_OTHER): Payer: BLUE CROSS/BLUE SHIELD

## 2018-09-28 DIAGNOSIS — I480 Paroxysmal atrial fibrillation: Secondary | ICD-10-CM

## 2018-10-23 ENCOUNTER — Other Ambulatory Visit: Payer: Self-pay | Admitting: Internal Medicine

## 2018-10-23 ENCOUNTER — Other Ambulatory Visit: Payer: Self-pay

## 2018-10-23 NOTE — Telephone Encounter (Signed)
Pt's wt 119.7, age 63, SCr 0.96, CrCl 111.87.

## 2018-11-25 ENCOUNTER — Telehealth: Payer: Self-pay | Admitting: Internal Medicine

## 2018-11-25 DIAGNOSIS — Z79899 Other long term (current) drug therapy: Secondary | ICD-10-CM

## 2018-11-25 DIAGNOSIS — I480 Paroxysmal atrial fibrillation: Secondary | ICD-10-CM

## 2018-11-25 NOTE — Telephone Encounter (Signed)
I spoke with the patient regarding her ZIO monitor.  She is aware of Dr. Olin Pia recommendations to:  1) Get an echo 2) Start digoxin 0.25 mg- BID x 2 days, then once daily 3) get a digoxin level/ BMP in 2 weeks  Per the patient, she has some digoxin at home from previous but cannot recall the dose- in reviewing her chart, she has had 0.25 mg & 0.125 mg tablets before. She will try to locate these and send me a MyChart message with the dose she has at home, or if she cannot find them.   She is aware we can set her echo for about 2 weeks out and get her BMP/ digoxin level the same day.   Will await a MyChart message from the patient prior to sending in more digoxin or placing her lab/ echo orders.

## 2018-11-25 NOTE — Telephone Encounter (Signed)
Reading Physician Reading Date Result Priority  Brandy Sprang, MD [phone] 11/22/2018 Routine    Narrative & Impression    Indication afib and rate control  Duration:5d  Findings Avg HR  102 , range 60-180 Mean during day and with activity 110-140  Rec  1) 2 d Echo to reassess LV function with rapid rates 2) begin dig 0.25 mg, bid x 2 days then qd 3) dig level and Bmet in 2 weeks

## 2018-11-29 MED ORDER — DIGOXIN 250 MCG PO TABS: ORAL_TABLET | ORAL | 1 refills | Status: DC

## 2018-11-29 NOTE — Telephone Encounter (Signed)
Patient called back and did not have digoxin at home. RX for digoxin 0.25 mg sent to the pharmacy.   Message to scheduling to please call to for an echo and labs in about 2 weeks.

## 2018-12-02 NOTE — Telephone Encounter (Signed)
She hasnt started dig yet . . . Going  to get it today and scheduled labs 8/4 echo 8/18

## 2018-12-02 NOTE — Telephone Encounter (Signed)
That's a bummer- she should at least get the labs done 2 weeks after starting the digoxin.  We need to make sure she is on the right dose.

## 2018-12-02 NOTE — Telephone Encounter (Signed)
Next echo is in 4 weeks.  Ok to wait for both ?

## 2018-12-03 NOTE — Telephone Encounter (Signed)
Patient calling to check on status  Please advise

## 2018-12-17 ENCOUNTER — Other Ambulatory Visit: Payer: BLUE CROSS/BLUE SHIELD

## 2018-12-31 ENCOUNTER — Ambulatory Visit (INDEPENDENT_AMBULATORY_CARE_PROVIDER_SITE_OTHER): Payer: BLUE CROSS/BLUE SHIELD

## 2018-12-31 ENCOUNTER — Other Ambulatory Visit: Payer: Self-pay

## 2018-12-31 DIAGNOSIS — Z79899 Other long term (current) drug therapy: Secondary | ICD-10-CM | POA: Diagnosis not present

## 2018-12-31 DIAGNOSIS — I480 Paroxysmal atrial fibrillation: Secondary | ICD-10-CM

## 2019-02-02 ENCOUNTER — Other Ambulatory Visit: Payer: Self-pay | Admitting: Internal Medicine

## 2019-03-10 ENCOUNTER — Other Ambulatory Visit: Payer: Self-pay

## 2019-03-11 ENCOUNTER — Telehealth: Payer: Self-pay | Admitting: Internal Medicine

## 2019-03-11 NOTE — Telephone Encounter (Signed)
Manufacturers has changed for Digoxin. Please call to discuss alternatives. Patient is watiting

## 2019-03-11 NOTE — Telephone Encounter (Signed)
I spoke with the pharmacy staff at [facility]. They are needing an ok from [place] for them to give her digoxin from a different manufacturer- for this particular drug they have to have an ok from the physician.   They have tried to obtain digoxin from her previous manufacturer and are unable to get this from them. She will need to be changed to the manufacturer- [employer].  Verbal ok given to give digoxin from a different manufacturer.

## 2019-04-23 ENCOUNTER — Telehealth: Payer: Self-pay | Admitting: Internal Medicine

## 2019-04-23 NOTE — Telephone Encounter (Signed)
I spoke with the patient. She states she will be going on Medicare in April. She is requesting a 3 month supply of Eliquis to be sent in now- this will get her to the middle of March. I advised I will pull about 3 weeks worth of eliquis samples for her to bridge the gap at the end of her refill.  The patient is also aware she is due for follow up with Dr. Caryl Comes. I have offered her to be seen this Friday morning at 8:40 am. The patient is agreeable.  She is aware I will give her her samples at that time.   Eliquis 5 mg Lot: [id number] Exp: 12/22 # 3 boxes given

## 2019-04-23 NOTE — Telephone Encounter (Signed)
Please call to discuss Eliquis. States her insurance is changing next year.

## 2019-04-23 NOTE — Telephone Encounter (Signed)
Attempted to call the patient. No answer- I left a message to please call back.  

## 2019-04-24 ENCOUNTER — Other Ambulatory Visit: Payer: Self-pay | Admitting: *Deleted

## 2019-04-24 MED ORDER — APIXABAN 5 MG PO TABS: 5.0000 mg | ORAL_TABLET | Freq: Two times a day (BID) | ORAL | 3 refills | Status: DC

## 2019-04-24 NOTE — Progress Notes (Signed)
Patient Care Team: Coral Spikes, DO as PCP - General (Family Medicine)   HPI  [name] [name] is a 63 y.o. female Seen in follow-up for atrial fibrillation for which dofetilide was started 2/18 with spontaneous conversion. She has a history of nonischemic cardiomyopathy with persistent left ventricular dysfunction occurring in the context of her atrial fibrillation with rapid and poorly controlled rates. The hope was that it was rate related. LV function has normalized as below  She had recurrent episodes of atrial fibrillation.  She was referred to Dr. Greggory [name] to consider ablation.  It was elected to defer.   Now with permanent atrial fibrillation so have stopped dofetilide     Exercise tolerance is improved although avg HR are still elevated ( resting 80s and exertional 120's) but trend over 6 months neg  The patient denies chest pain, nocturnal dyspnea, orthopnea.  There have been, lightheadedness or syncope  Scant palps, mostly at night; sob still with moderate exertion and has lost 30 lbs--" God is good."  Less stress    Tolerating metoprolol--- not sure she says benefit with dig, esp on exertional average     Date Cr K Mg Hgb  3/18  0.76 3.9   14.5 (2/18)  4/18  0.76 4.2 2.0   5/18 0.83 4.9  15.1  2/19 0.84 4.1 2.1   10/19 0.96 4.5 2.3 13.6           DATE TEST EF   11/17 Echo  15-20 %   1/18 Echo  35 %   5/18 Echo  50-55% LA size-normal   8/20 Echo 60-65%         Thromboembolic risk factors ( HTN-1, CHF-1, Gender-1) for a CHADSVASc Score of  3  Past Medical History:  Diagnosis Date  . Chicken pox   . Chronic systolic CHF (congestive heart failure) ([place])    a. 03/2016 Echo: Ef 15-20%, diff HK, ant AK;  b. 05/2016 Echo: Ef 30-35%, diff HK, mildly dil LA/RA.  [name] History of kidney stones   . Moderate mitral regurgitation    a. 03/2016 Echo: mod MR in setting of LV dysfxn.  [name] NICM (nonischemic cardiomyopathy) ([place])    a. 03/2016 Echo: EF 15-20%, diff HK, ant AK, mod  MR, mod dil LA, mildly dil RA;  b. 04/2016 Cath: nl cors;  c. 05/2016 Echo: EF 30-35%, diff HK.  . Obesity   . Obstructive sleep apnea    compliant with CPAP  . Persistent atrial fibrillation ([place])    a. Dx 03/2016;  b. CHA2DS2VASc = 2-->Eliquis 5mg  BID;  b. 05/2016 Failed DCCV x 4.    Past Surgical History:  Procedure Laterality Date  . CARDIAC CATHETERIZATION N/A 04/17/2016   Procedure: Left Heart Cath and Coronary Angiography;  Surgeon: [name] Hampshire, MD;  Location: [facility];  Service: Cardiovascular;  Laterality: N/A;  . CHOLECYSTECTOMY    . ELECTROPHYSIOLOGIC STUDY N/A 05/26/2016   Procedure: Cardioversion;  Surgeon: [name] Hampshire, MD;  Location: [facility];  Service: Cardiovascular;  Laterality: N/A;    Current Outpatient Medications  Medication Sig Dispense Refill  . apixaban (ELIQUIS) 5 MG TABS tablet Take 1 tablet (5 mg total) by mouth 2 (two) times daily. 180 tablet 3  . Cholecalciferol (VITAMIN D3) 1000 units CAPS Take 1 capsule by mouth 2 (two) times daily.     . digoxin (LANOXIN) 0.25 MG tablet Take 1 tablet (0.25 mg total) by mouth daily. [place]  tablet 8  . furosemide (LASIX) 20 MG tablet Take 20 mg by mouth daily as needed for fluid or edema.    . magnesium gluconate (MAGONATE) 500 MG tablet Take 500 mg by mouth daily.    . metoprolol succinate (TOPROL-XL) 100 MG 24 hr tablet Take 1 tablet (100 mg total) by mouth daily. Take with or immediately following a meal. 90 tablet 3  . OIL OF OREGANO PO Take 1 application by mouth daily. To left big toe    . vitamin B-12 (CYANOCOBALAMIN) 1000 MCG tablet Take 1,000 mcg by mouth 2 (two) times daily.    . vitamin C (ASCORBIC ACID) 500 MG tablet Take 500 mg by mouth daily.    [name] zinc gluconate 50 MG tablet Take 50 mg by mouth daily.     No current facility-administered medications for this visit.    No Known Allergies    Review of Systems negative except from HPI and PMH  Physical Exam BP 126/70 (BP Location: Left  Arm, Patient Position: Sitting, Cuff Size: Normal)   Pulse 86   Ht 5\' 7"  (1.702 m)   Wt 232 lb 8 oz (105.5 kg)   SpO2 98%   BMI 36.41 kg/m  Well developed and nourished in no acute distress HENT normal Neck supple with JVP-flat Carotids brisk and full without bruits Clear Irregularly irregular rate and rhythm with controlled ventricular response, no murmurs or gallops Abd-soft with active BS without hepatomegaly No Clubbing cyanosis edema Skin-warm and dry A & Oriented  Grossly normal sensory and motor function  ECG: AF  @ 86            Intervals  -/06/33  Axis 40      Assessment and  Plan  Atrial fibrillation-persistent  Cardiomyopathy-- nonischemic-rate related resolved  High Risk Medication Surveillance-dig  Hypertension  Obesity    BP is better  Will continue to increase metop 100>>150  Check dig level and CBC; BMET

## 2019-04-25 ENCOUNTER — Other Ambulatory Visit: Payer: Self-pay

## 2019-04-25 ENCOUNTER — Encounter: Payer: Self-pay | Admitting: Internal Medicine

## 2019-04-25 ENCOUNTER — Ambulatory Visit (INDEPENDENT_AMBULATORY_CARE_PROVIDER_SITE_OTHER): Payer: BLUE CROSS/BLUE SHIELD | Admitting: Internal Medicine

## 2019-04-25 VITALS — BP 126/70 | HR 86 | Ht 67.0 in | Wt 232.5 lb

## 2019-04-25 DIAGNOSIS — I428 Other cardiomyopathies: Secondary | ICD-10-CM

## 2019-04-25 DIAGNOSIS — I5022 Chronic systolic (congestive) heart failure: Secondary | ICD-10-CM

## 2019-04-25 DIAGNOSIS — I4821 Permanent atrial fibrillation: Secondary | ICD-10-CM | POA: Diagnosis not present

## 2019-04-25 DIAGNOSIS — Z79899 Other long term (current) drug therapy: Secondary | ICD-10-CM | POA: Diagnosis not present

## 2019-04-25 MED ORDER — METOPROLOL SUCCINATE ER 100 MG PO TB24: ORAL_TABLET | ORAL | 3 refills | Status: DC

## 2019-04-25 NOTE — Patient Instructions (Addendum)
Medication Instructions:  - Your physician has recommended you make the following change in your medication:   1) Increase metoprolol succinate 100 mg- take 1 & 1/2 tablets (150 mg) by mouth once daily  *If you need a refill on your cardiac medications before your next appointment, please call your pharmacy*  Lab Work: - Your physician recommends that you have lab work today: BMP/ CBC/ Digoxin  If you have labs (blood work) drawn today and your tests are completely normal, you will receive your results only by: [name] MyChart Message (if you have MyChart) OR . A paper copy in the mail If you have any lab test that is abnormal or we need to change your treatment, we will call you to review the results.  Testing/Procedures: - none ordered  Follow-Up: At [facility], you and your health needs are our priority.  As part of our continuing mission to provide you with exceptional heart care, we have created designated Provider Care Teams.  These Care Teams include your primary Cardiologist (physician) and Advanced Practice Providers (APPs -  Physician Assistants and Nurse Practitioners) who all work together to provide you with the care you need, when you need it.  Your next appointment:   8 week(s)  The format for your next appointment:   Virtual Visit   Provider:   Virl Axe, MD  Other Instructions n/a

## 2019-04-26 LAB — CBC WITH DIFFERENTIAL/PLATELET
Basophils Absolute: 0.1 10*3/uL (ref 0.0–0.2)
Basos: 1 %
EOS (ABSOLUTE): 0.2 10*3/uL (ref 0.0–0.4)
Eos: 3 %
Hematocrit: 44.2 % (ref 34.0–46.6)
Hemoglobin: 15.1 g/dL (ref 11.1–15.9)
Immature Grans (Abs): 0 10*3/uL (ref 0.0–0.1)
Immature Granulocytes: 0 %
Lymphocytes Absolute: 2.5 10*3/uL (ref 0.7–3.1)
Lymphs: 36 %
MCH: 30.9 pg (ref 26.6–33.0)
MCHC: 34.2 g/dL (ref 31.5–35.7)
MCV: 91 fL (ref 79–97)
Monocytes Absolute: 0.8 10*3/uL (ref 0.1–0.9)
Monocytes: 11 %
Neutrophils Absolute: 3.5 10*3/uL (ref 1.4–7.0)
Neutrophils: 49 %
Platelets: 266 10*3/uL (ref 150–450)
RBC: 4.88 x10E6/uL (ref 3.77–5.28)
RDW: 11.6 % — ABNORMAL LOW (ref 11.7–15.4)
WBC: 7.1 10*3/uL (ref 3.4–10.8)

## 2019-04-26 LAB — BASIC METABOLIC PANEL
BUN/Creatinine Ratio: 10 — ABNORMAL LOW (ref 12–28)
BUN: 8 mg/dL (ref 8–27)
CO2: 18 mmol/L — ABNORMAL LOW (ref 20–29)
Calcium: 9.5 mg/dL (ref 8.7–10.3)
Chloride: 99 mmol/L (ref 96–106)
Creatinine, Ser: 0.84 mg/dL (ref 0.57–1.00)
GFR calc Af Amer: 85 mL/min/{1.73_m2} (ref >59)
GFR calc non Af Amer: 74 mL/min/{1.73_m2} (ref >59)
Glucose: 388 mg/dL — ABNORMAL HIGH (ref 65–99)
Potassium: 4.4 mmol/L (ref 3.5–5.2)
Sodium: 134 mmol/L (ref 134–144)

## 2019-04-26 LAB — DIGOXIN LEVEL: Digoxin, Serum: 0.5 ng/mL (ref 0.5–0.9)

## 2019-05-16 HISTORY — PX: COLON SURGERY: SHX602

## 2019-06-19 ENCOUNTER — Telehealth: Payer: BLUE CROSS/BLUE SHIELD | Admitting: Internal Medicine

## 2019-08-12 ENCOUNTER — Telehealth: Payer: Self-pay | Admitting: Internal Medicine

## 2019-08-12 NOTE — Telephone Encounter (Signed)
Spoke with patient and she was returning a call from our billing office requesting updated insurance information. Reviewed that I work in [employer] office and so this was probably sent to wrong person but that I would forward to our billing team to see if they can assist. She was appreciative for the call back with no further questions at this time.

## 2019-08-12 NOTE — Telephone Encounter (Signed)
New Message  Patient called and said that she is calling back to speak with Pam.  Please call back

## 2019-08-21 ENCOUNTER — Other Ambulatory Visit: Payer: Self-pay

## 2019-08-21 ENCOUNTER — Telehealth: Payer: Self-pay

## 2019-08-21 ENCOUNTER — Telehealth (INDEPENDENT_AMBULATORY_CARE_PROVIDER_SITE_OTHER): Payer: Medicare Other | Admitting: Internal Medicine

## 2019-08-21 VITALS — BP 123/67 | HR 74 | Ht 67.0 in | Wt 223.0 lb

## 2019-08-21 DIAGNOSIS — E669 Obesity, unspecified: Secondary | ICD-10-CM

## 2019-08-21 DIAGNOSIS — I4819 Other persistent atrial fibrillation: Secondary | ICD-10-CM

## 2019-08-21 DIAGNOSIS — I10 Essential (primary) hypertension: Secondary | ICD-10-CM | POA: Diagnosis not present

## 2019-08-21 DIAGNOSIS — I428 Other cardiomyopathies: Secondary | ICD-10-CM

## 2019-08-21 NOTE — Progress Notes (Signed)
.does not have     Electrophysiology TeleHealth Note   Due to national recommendations of social distancing due to [place] 19, an audio/video telehealth visit is felt to be most appropriate for this patient at this time.  See MyChart message from today for the patient's consent to telehealth for [name].   Date:  08/21/2019   ID:  Brandy [name], DOB 09-13-1955, MRN [id number]  Location: patient's home  Provider location: [address] [place]  Evaluation Performed: Follow-up visit  PCP:  Coral Spikes, DO  Cardiologist:    Electrophysiologist:  SK   Chief Complaint:  Atrial fib  History of Present Illness:    Brandy [name] is a 64 y.o. female who presents via audio/video conferencing for a telehealth visit today.  Since last being seen in our clinic for atrial fib now permanent but with rapid rates and recent uptitration of betablockers  the patient reports improved HR resting at exertion More energy, not sure whether the sun but definitely progress   Date Cr K Dig Mg Hgb  3/18  0.76 3.9    14.5 (2/18)  4/18  0.76 4.2  2.0   5/18 0.83 4.9   15.1  2/19 0.84 4.1  2.1   10/19 0.96 4.5  2.3 13.6    12/20 0.84 4.4 0.4     DATE TEST EF   11/17 Echo  15-20 %   1/18 Echo  35 %   5/18 Echo  50-55% LA size-normal   8/20 Echo 60-65%         Thromboembolic risk factors ( HTN-1, CHF-1, Gender-1) for a CHADSVASc Score of  3       The patient denies symptoms of fevers, chills, cough, or new SOB worrisome for COVID 19.  *  Past Medical History:  Diagnosis Date  . Chicken pox   . Chronic systolic CHF (congestive heart failure) ([place])    a. 03/2016 Echo: Ef 15-20%, diff HK, ant AK;  b. 05/2016 Echo: Ef 30-35%, diff HK, mildly dil LA/RA.  [name] History of kidney stones   . Moderate mitral regurgitation    a. 03/2016 Echo: mod MR in setting of LV dysfxn.  [name] NICM (nonischemic cardiomyopathy) ([place])    a. 03/2016 Echo: EF 15-20%, diff HK, ant AK, mod MR, mod dil  LA, mildly dil RA;  b. 04/2016 Cath: nl cors;  c. 05/2016 Echo: EF 30-35%, diff HK.  . Obesity   . Obstructive sleep apnea    compliant with CPAP  . Persistent atrial fibrillation ([place])    a. Dx 03/2016;  b. CHA2DS2VASc = 2-->Eliquis 5mg  BID;  b. 05/2016 Failed DCCV x 4.    Past Surgical History:  Procedure Laterality Date  . CARDIAC CATHETERIZATION N/A 04/17/2016   Procedure: Left Heart Cath and Coronary Angiography;  Surgeon: Wellington Hampshire, MD;  Location: [facility];  Service: Cardiovascular;  Laterality: N/A;  . CHOLECYSTECTOMY    . ELECTROPHYSIOLOGIC STUDY N/A 05/26/2016   Procedure: Cardioversion;  Surgeon: Wellington Hampshire, MD;  Location: [facility] ORS;  Service: Cardiovascular;  Laterality: N/A;    Current Outpatient Medications  Medication Sig Dispense Refill  . apixaban (ELIQUIS) 5 MG TABS tablet Take 1 tablet (5 mg total) by mouth 2 (two) times daily. 180 tablet 3  . Cholecalciferol (VITAMIN D3) 1000 units CAPS Take 1 capsule by mouth 2 (two) times daily.     . digoxin (LANOXIN) 0.25 MG tablet Take 1 tablet (0.25  mg total) by mouth daily. 30 tablet 8  . furosemide (LASIX) 20 MG tablet Take 20 mg by mouth daily as needed for fluid or edema.    . magnesium gluconate (MAGONATE) 500 MG tablet Take 500 mg by mouth daily.    . metoprolol succinate (TOPROL-XL) 100 MG 24 hr tablet Take 1.5 tablets (150 mg) by mouth once daily. Take with or immediately following a meal. 135 tablet 3  . Multiple Vitamins-Minerals (MULTIVITAMIN WITH MINERALS) tablet Take 1 tablet by mouth daily.    . vitamin B-12 (CYANOCOBALAMIN) 1000 MCG tablet Take 1,000 mcg by mouth 2 (two) times daily.    . vitamin C (ASCORBIC ACID) 500 MG tablet Take 500 mg by mouth daily.    [name] zinc gluconate 50 MG tablet Take 50 mg by mouth daily.    . OIL OF OREGANO PO Take 1 application by mouth daily. To left big toe     No current facility-administered medications for this visit.    Allergies:   Patient has no known  allergies.   Social History:  The patient  reports that she has never smoked. She has never used smokeless tobacco. She reports that she does not drink alcohol or use drugs.   Family History:  The patient's   family history includes COPD in her father and mother; Heart disease in her father.   ROS:  Please see the history of present illness.   All other systems are personally reviewed and negative.    Exam:    Vital Signs:  BP 123/67   Pulse 74   Ht 5\' 7"  (1.702 m)   Wt 223 lb (101.2 kg)   BMI 34.93 kg/m       Labs/Other Tests and Data Reviewed:    Recent Labs: 04/25/2019: BUN 8; Creatinine, Ser 0.84; Hemoglobin 15.1; Platelets 266; Potassium 4.4; Sodium 134   Wt Readings from Last 3 Encounters:  08/21/19 223 lb (101.2 kg)  04/25/19 232 lb 8 oz (105.5 kg)  09/19/18 264 lb (119.7 kg)     Other studies personally reviewed: Additional studies/ records that were reviewed today include:  .    ASSESSMENT & PLAN:    Atrial fibrillation-persistent  Cardiomyopathy-- nonischemic-rate related resolved  High Risk Medication Surveillance-dig  Hypertension  Obesity   Discussed [place] as an alternative.  CHADSVAS score somewhere between 3-4 ( how do we consider previous LV dysfunction?)  Continue betablocker with improvement in rate control  Dig dose within range  BP better control, perhaps with wt loss  No bleeding on anticoagulation    COVID 19 screen The patient denies symptoms of COVID 19 at this time.  The importance of social distancing was discussed today.  Follow-up:  43m*   Current medicines are reviewed at length with the patient today.   The patient does not have concerns regarding her medicines.  The following changes were made today:  none  Labs/ tests ordered today include:   No orders of the defined types were placed in this encounter.   Future tests ( post COVID )     Patient Risk:  after full review of this patients clinical status, I  feel that they are at moderate  risk at this time.  Today, I have spent 16 minutes with the patient with telehealth technology discussing the above.  Signed, Virl Axe, MD  08/21/2019 3:35 PM     [address] [place] [address] [phone] (office) [phone] (fax)

## 2019-08-21 NOTE — Patient Instructions (Signed)
Medication Instructions:  Your physician recommends that you continue on your current medications as directed. Please refer to the Current Medication list given to you today.   *If you need a refill on your cardiac medications before your next appointment, please call your pharmacy*   Lab Work: None ordered.  If you have labs (blood work) drawn today and your tests are completely normal, you will receive your results only by: [name] MyChart Message (if you have MyChart) OR . A paper copy in the mail If you have any lab test that is abnormal or we need to change your treatment, we will call you to review the results.   Testing/Procedures: None ordered.    Follow-Up: At [facility], you and your health needs are our priority.  As part of our continuing mission to provide you with exceptional heart care, we have created designated Provider Care Teams.  These Care Teams include your primary Cardiologist (physician) and Advanced Practice Providers (APPs -  Physician Assistants and Nurse Practitioners) who all work together to provide you with the care you need, when you need it.  We recommend signing up for the patient portal called "MyChart".  Sign up information is provided on this After Visit Summary.  MyChart is used to connect with patients for Virtual Visits (Telemedicine).  Patients are able to view lab/test results, encounter notes, upcoming appointments, etc.  Non-urgent messages can be sent to your provider as well.   To learn more about what you can do with MyChart, go to [email or web address].    Your next appointment:   6 months with Dr Caryl Comes.  You will receive a letter to remind you to schedule.  The format for your next appointment:  In person

## 2019-08-21 NOTE — Telephone Encounter (Signed)
  Patient Consent for Virtual Visit         Brandy [name] has provided verbal consent on 08/21/2019 for a virtual visit (video or telephone).   CONSENT FOR VIRTUAL VISIT FOR:  Brandy [name]  By participating in this virtual visit I agree to the following:  I hereby voluntarily request, consent and authorize [facility] and its employed or contracted physicians, physician assistants, nurse practitioners or other licensed health care professionals (the Practitioner), to provide me with telemedicine health care services (the "Services") as deemed necessary by the treating Practitioner. I acknowledge and consent to receive the Services by the Practitioner via telemedicine. I understand that the telemedicine visit will involve communicating with the Practitioner through live audiovisual communication technology and the disclosure of certain medical information by electronic transmission. I acknowledge that I have been given the opportunity to request an in-person assessment or other available alternative prior to the telemedicine visit and am voluntarily participating in the telemedicine visit.  I understand that I have the right to withhold or withdraw my consent to the use of telemedicine in the course of my care at any time, without affecting my right to future care or treatment, and that the Practitioner or I may terminate the telemedicine visit at any time. I understand that I have the right to inspect all information obtained and/or recorded in the course of the telemedicine visit and may receive copies of available information for a reasonable fee.  I understand that some of the potential risks of receiving the Services via telemedicine include:  [name] Delay or interruption in medical evaluation due to technological equipment failure or disruption; . Information transmitted may not be sufficient (e.g. poor resolution of images) to allow for appropriate medical decision making by the Practitioner; and/or    . In rare instances, security protocols could fail, causing a breach of personal health information.  Furthermore, I acknowledge that it is my responsibility to provide information about my medical history, conditions and care that is complete and accurate to the best of my ability. I acknowledge that Practitioner's advice, recommendations, and/or decision may be based on factors not within their control, such as incomplete or inaccurate data provided by me or distortions of diagnostic images or specimens that may result from electronic transmissions. I understand that the practice of medicine is not an exact science and that Practitioner makes no warranties or guarantees regarding treatment outcomes. I acknowledge that a copy of this consent can be made available to me via my patient portal ([facility]), or I can request a printed copy by calling the office of [facility].    I understand that my insurance will be billed for this visit.   I have read or had this consent read to me. . I understand the contents of this consent, which adequately explains the benefits and risks of the Services being provided via telemedicine.  . I have been provided ample opportunity to ask questions regarding this consent and the Services and have had my questions answered to my satisfaction. . I give my informed consent for the services to be provided through the use of telemedicine in my medical care

## 2019-10-17 ENCOUNTER — Ambulatory Visit: Payer: Self-pay | Admitting: Internal Medicine

## 2019-10-20 ENCOUNTER — Other Ambulatory Visit: Payer: Self-pay

## 2019-10-20 ENCOUNTER — Ambulatory Visit (INDEPENDENT_AMBULATORY_CARE_PROVIDER_SITE_OTHER): Payer: Medicare Other | Admitting: Internal Medicine

## 2019-10-20 ENCOUNTER — Encounter: Payer: Self-pay | Admitting: Internal Medicine

## 2019-10-20 VITALS — BP 108/76 | HR 73 | Temp 98.5°F | Ht 67.0 in | Wt 217.0 lb

## 2019-10-20 DIAGNOSIS — R634 Abnormal weight loss: Secondary | ICD-10-CM

## 2019-10-20 DIAGNOSIS — Z1211 Encounter for screening for malignant neoplasm of colon: Secondary | ICD-10-CM | POA: Diagnosis not present

## 2019-10-20 DIAGNOSIS — R1310 Dysphagia, unspecified: Secondary | ICD-10-CM | POA: Insufficient documentation

## 2019-10-20 DIAGNOSIS — I4819 Other persistent atrial fibrillation: Secondary | ICD-10-CM

## 2019-10-20 DIAGNOSIS — R739 Hyperglycemia, unspecified: Secondary | ICD-10-CM

## 2019-10-20 DIAGNOSIS — R0981 Nasal congestion: Secondary | ICD-10-CM | POA: Insufficient documentation

## 2019-10-20 DIAGNOSIS — I8393 Asymptomatic varicose veins of bilateral lower extremities: Secondary | ICD-10-CM | POA: Insufficient documentation

## 2019-10-20 NOTE — Progress Notes (Signed)
Date:  10/20/2019   Name:  Brandy [name]   DOB:  03/06/1956   MRN:  [id number]   Chief Complaint: Establish Care (tdap/ colonscopy/ weight loss)  Hip Pain  There was no injury mechanism. The pain is present in the right hip. The quality of the pain is described as aching. The pain is mild. The pain has been fluctuating since onset. Pertinent negatives include no numbness. Treatments tried: topical essential oils and chiropractic care. The treatment provided moderate relief.    Atrial fibrillation - still having some Afib but it is improved. On metoprolol for rate control and also on Eliquis.  Also on digoxin with good effect.  Originally had cardiomyopathy and CHF but most recent ECHO showed EF 60-65%.  Weight loss - she reports losing 50 lbs in the past year with minimal effort.  She is concerned that there is some other problem.  Of note, her glucose was 388 on labs done by cardiology in December.  She denies blurred vision, excessive thirst, excessive urination, numbness or weakness.  Varicose veins - she has very large veins in the left leg.  Some have been there since she was in her 46's.  They are minimally symptomatic but they are continuing to enlarge and she would like to have an evaluation.  Dysphagia - she has noticed occasional food hanging up in her upper esophagus, especially with bread or chicken.  She can stop eating, relax and the food will move on down.  She has not had to regurgitate or go to the ED.  CRC screening - last colonoscopy was done in 2006 and was normal.  She is ready to do another.  Sinus congestion - she has some facial pressure and only clear nasal discharge. No productive cough, wheezing or shortness of breath.  Mild ear congestion but no pain.  Using essential oils - peppermint - with benefit.  Has not tried claritin or Flonase.  Lab Results  Component Value Date   CREATININE 0.84 04/25/2019   BUN 8 04/25/2019   NA 134 04/25/2019   K 4.4 04/25/2019   CL  99 04/25/2019   CO2 18 (L) 04/25/2019   Lab Results  Component Value Date   CHOL 191 10/31/2016   HDL 41.30 10/31/2016   LDLDIRECT 131.0 10/31/2016   TRIG 216.0 (H) 10/31/2016   CHOLHDL 5 10/31/2016   Lab Results  Component Value Date   TSH 3.040 04/12/2016   Lab Results  Component Value Date   HGBA1C 6.2 10/31/2016   Lab Results  Component Value Date   WBC 7.1 04/25/2019   HGB 15.1 04/25/2019   HCT 44.2 04/25/2019   MCV 91 04/25/2019   PLT 266 04/25/2019   Lab Results  Component Value Date   ALT 63 07/31/2011   AST 27 07/31/2011   ALKPHOS 108 07/31/2011   BILITOT 0.5 07/31/2011    Review of Systems  Constitutional: Positive for unexpected weight change. Negative for appetite change, chills, fatigue and fever.  HENT: Positive for congestion, sinus pressure and trouble swallowing. Negative for facial swelling, hearing loss and sore throat.   Eyes: Negative for visual disturbance.  Respiratory: Negative for cough, chest tightness and shortness of breath.   Cardiovascular: Positive for leg swelling. Negative for chest pain and palpitations.  Endocrine: Negative for polydipsia and polyuria.  Genitourinary: Negative for difficulty urinating, frequency and urgency.  Skin: Negative for color change and rash.  Neurological: Negative for dizziness, light-headedness, numbness and headaches.  Psychiatric/Behavioral:  Negative for dysphoric mood and sleep disturbance. The patient is not nervous/anxious.     Patient Active Problem List   Diagnosis Date Noted  . Preventative health care 10/31/2016  . Osteoarthritis of right hand 10/31/2016  . Visit for monitoring Tikosyn therapy 07/24/2016  . Persistent atrial fibrillation ([place])   . Chronic combined systolic and diastolic heart failure, NYHA class 2 ([place]) 04/14/2016  . Nonischemic cardiomyopathy ([place]) 04/14/2016  . Morbid obesity ([place]) 04/14/2016    No Known Allergies  Past Surgical History:  Procedure Laterality Date    . CARDIAC CATHETERIZATION N/A 04/17/2016   Procedure: Left Heart Cath and Coronary Angiography;  Surgeon: Wellington Hampshire, MD;  Location: [facility];  Service: Cardiovascular;  Laterality: N/A;  . CHOLECYSTECTOMY    . ELECTROPHYSIOLOGIC STUDY N/A 05/26/2016   Procedure: Cardioversion;  Surgeon: Wellington Hampshire, MD;  Location: [facility] ORS;  Service: Cardiovascular;  Laterality: N/A;    Social History   Tobacco Use  . Smoking status: Never Smoker  . Smokeless tobacco: Never Used  Substance Use Topics  . Alcohol use: No  . Drug use: No     Medication list has been reviewed and updated.  Current Meds  Medication Sig  . apixaban (ELIQUIS) 5 MG TABS tablet Take 1 tablet (5 mg total) by mouth 2 (two) times daily.  [name] CINNAMON PO Take by mouth.  . CRANBERRY PO Take 1 capsule by mouth. Soft gel  . digoxin (LANOXIN) 0.25 MG tablet Take 1 tablet (0.25 mg total) by mouth daily.  . furosemide (LASIX) 20 MG tablet Take 20 mg by mouth daily as needed for fluid or edema.  Laurina Bustle Oil OIL by Does not apply route.  Koren Bound Oil OIL by Does not apply route.  . Lemongrass Oil OIL by Does not apply route.  . magnesium gluconate (MAGONATE) 500 MG tablet Take 500 mg by mouth daily.  [name] MELALEUCA [place] Inject into the skin. Oil  . metoprolol succinate (TOPROL-XL) 100 MG 24 hr tablet Take 1.5 tablets (150 mg) by mouth once daily. Take with or immediately following a meal.  . Multiple Vitamins-Minerals (MULTIVITAMIN WITH MINERALS) tablet Take 1 tablet by mouth daily.  . peppermint oil liquid by Does not apply route as needed.  . Tea Tree Oil OIL by Does not apply route. On big toe  . zinc gluconate 50 MG tablet Take 50 mg by mouth daily.    PHQ 2/9 Scores 10/20/2019  PHQ - 2 Score 0  PHQ- 9 Score 0   GAD 7 : Generalized Anxiety Score 10/20/2019  Nervous, Anxious, on Edge 1  Control/stop worrying 0  Worry too much - different things 0  Trouble relaxing 0  Restless 0  Easily annoyed or  irritable 0  Afraid - awful might happen 0  Total GAD 7 Score 1  Anxiety Difficulty Not difficult at all      BP Readings from Last 3 Encounters:  10/20/19 108/76  08/21/19 123/67  04/25/19 126/70    Physical Exam Vitals and nursing note reviewed.  Constitutional:      General: She is not in acute distress.    Appearance: Normal appearance. She is well-developed.  HENT:     Head: Normocephalic and atraumatic.  Neck:     Vascular: No carotid bruit.  Cardiovascular:     Rate and Rhythm: Normal rate. Rhythm irregular.     Pulses: Normal pulses.  Pulmonary:     Effort: Pulmonary effort is normal. No  respiratory distress.     Breath sounds: No wheezing or rhonchi.  Musculoskeletal:     Cervical back: Normal range of motion.     Right lower leg: No edema.     Left lower leg: No edema.     Comments: Large VV on left LE  Lymphadenopathy:     Cervical: No cervical adenopathy.  Skin:    General: Skin is warm and dry.     Capillary Refill: Capillary refill takes less than 2 seconds.     Findings: No rash.  Neurological:     General: No focal deficit present.     Mental Status: She is alert and oriented to person, place, and time.     Gait: Gait normal.  Psychiatric:        Mood and Affect: Mood normal.        Behavior: Behavior normal.        Thought Content: Thought content normal.     Wt Readings from Last 3 Encounters:  10/20/19 217 lb (98.4 kg)  08/21/19 223 lb (101.2 kg)  04/25/19 232 lb 8 oz (105.5 kg)    BP 108/76   Pulse 73   Temp 98.5 F (36.9 C) (Oral)   Ht 5\' 7"  (1.702 m)   Wt 217 lb (98.4 kg)   SpO2 97%   BMI 33.99 kg/m   Assessment and Plan: 1. Persistent atrial fibrillation (HCC) In Afib today - suspect it is persistent Rate controlled; on DOAC anticoagulation for life with no bleeding issues  2. Dysphagia, unspecified type New problem that needs GI evaluation - will refer - Ambulatory referral to Gastroenterology - CBC with  Differential/Platelet  3. Colon cancer screening - Ambulatory referral to Gastroenterology  4. Varicose veins of both lower extremities, unspecified whether complicated Minimally sx - recommend compression stockings and elevation as needed - Ambulatory referral to Vascular Surgery  5. Hyperglycemia Along with significant unintended weight loss I suspect new onset DM Will obtain labs and instruct pt in appropriate follow up - Hemoglobin A1c - Comprehensive metabolic panel  6. Weight loss, non-intentional - TSH + free T4  7. Nasal sinus congestion No evidence of sinus infection Recommend Flonase Nasal spray as needed for congestion Return if sx worsen.   Partially dictated using Editor, commissioning. Any errors are unintentional.  Halina Maidens, MD [facility]  10/20/2019

## 2019-10-21 LAB — COMPREHENSIVE METABOLIC PANEL
ALT: 89 IU/L — ABNORMAL HIGH (ref 0–32)
AST: 87 IU/L — ABNORMAL HIGH (ref 0–40)
Albumin/Globulin Ratio: 1.6 (ref 1.2–2.2)
Albumin: 4.8 g/dL (ref 3.8–4.8)
Alkaline Phosphatase: 133 IU/L — ABNORMAL HIGH (ref 48–121)
BUN/Creatinine Ratio: 12 (ref 12–28)
BUN: 12 mg/dL (ref 8–27)
Bilirubin Total: 0.5 mg/dL (ref 0.0–1.2)
CO2: 23 mmol/L (ref 20–29)
Calcium: 10 mg/dL (ref 8.7–10.3)
Chloride: 95 mmol/L — ABNORMAL LOW (ref 96–106)
Creatinine, Ser: 1.01 mg/dL — ABNORMAL HIGH (ref 0.57–1.00)
GFR calc Af Amer: 68 mL/min/{1.73_m2} (ref >59)
GFR calc non Af Amer: 59 mL/min/{1.73_m2} — ABNORMAL LOW (ref >59)
Globulin, Total: 3 g/dL (ref 1.5–4.5)
Glucose: 365 mg/dL — ABNORMAL HIGH (ref 65–99)
Potassium: 4.7 mmol/L (ref 3.5–5.2)
Sodium: 133 mmol/L — ABNORMAL LOW (ref 134–144)
Total Protein: 7.8 g/dL (ref 6.0–8.5)

## 2019-10-21 LAB — CBC WITH DIFFERENTIAL/PLATELET
Basophils Absolute: 0.1 10*3/uL (ref 0.0–0.2)
Basos: 1 %
EOS (ABSOLUTE): 0.2 10*3/uL (ref 0.0–0.4)
Eos: 2 %
Hematocrit: 46.6 % (ref 34.0–46.6)
Hemoglobin: 16.1 g/dL — ABNORMAL HIGH (ref 11.1–15.9)
Immature Grans (Abs): 0 10*3/uL (ref 0.0–0.1)
Immature Granulocytes: 0 %
Lymphocytes Absolute: 3.4 10*3/uL — ABNORMAL HIGH (ref 0.7–3.1)
Lymphs: 37 %
MCH: 31.2 pg (ref 26.6–33.0)
MCHC: 34.5 g/dL (ref 31.5–35.7)
MCV: 90 fL (ref 79–97)
Monocytes Absolute: 0.9 10*3/uL (ref 0.1–0.9)
Monocytes: 10 %
Neutrophils Absolute: 4.5 10*3/uL (ref 1.4–7.0)
Neutrophils: 50 %
Platelets: 266 10*3/uL (ref 150–450)
RBC: 5.16 x10E6/uL (ref 3.77–5.28)
RDW: 11.7 % (ref 11.7–15.4)
WBC: 9.2 10*3/uL (ref 3.4–10.8)

## 2019-10-21 LAB — HEMOGLOBIN A1C
Est. average glucose Bld gHb Est-mCnc: 298 mg/dL
Hgb A1c MFr Bld: 12 % — ABNORMAL HIGH (ref 4.8–5.6)

## 2019-10-21 LAB — TSH+FREE T4
Free T4: 1.18 ng/dL (ref 0.82–1.77)
TSH: 1.88 u[IU]/mL (ref 0.450–4.500)

## 2019-10-24 ENCOUNTER — Encounter: Payer: Self-pay | Admitting: Internal Medicine

## 2019-10-24 ENCOUNTER — Ambulatory Visit (INDEPENDENT_AMBULATORY_CARE_PROVIDER_SITE_OTHER): Payer: Medicare Other | Admitting: Internal Medicine

## 2019-10-24 ENCOUNTER — Other Ambulatory Visit: Payer: Self-pay

## 2019-10-24 VITALS — BP 120/72 | HR 105 | Temp 97.9°F | Ht 67.0 in | Wt 215.0 lb

## 2019-10-24 DIAGNOSIS — Z23 Encounter for immunization: Secondary | ICD-10-CM

## 2019-10-24 DIAGNOSIS — E118 Type 2 diabetes mellitus with unspecified complications: Secondary | ICD-10-CM | POA: Diagnosis not present

## 2019-10-24 MED ORDER — BLOOD GLUCOSE MONITOR KIT: PACK | 0 refills | Status: DC

## 2019-10-24 MED ORDER — METFORMIN HCL ER 500 MG PO TB24: 500.0000 mg | ORAL_TABLET | Freq: Two times a day (BID) | ORAL | 2 refills | Status: DC

## 2019-10-24 NOTE — Patient Instructions (Signed)
Start Metformin with breakfast for about one week, then if no side effects, increase to twice a day.

## 2019-10-24 NOTE — Progress Notes (Signed)
Date:  10/24/2019   Name:  Brandy [name]   DOB:  1955/09/11   MRN:  [id number]   Chief Complaint: Diabetes (discuss diabetes/medication)  Diabetes She presents for her initial diabetic visit. She has type 2 diabetes mellitus. Pertinent negatives for hypoglycemia include no dizziness, headaches or nervousness/anxiousness. Associated symptoms include weight loss. Pertinent negatives for diabetes include no chest pain, no fatigue, no polydipsia and no polyuria.  New diagnosis from recent visit to establish care.  She is here to start medication and discuss the findings. Her brother has DM but lives [place] of state. She is not familiar with FSBS but is willing to learn. She does want DM education due to general lack of knowledge and many questions.  Lab Results  Component Value Date   CREATININE 1.01 (H) 10/20/2019   BUN 12 10/20/2019   NA 133 (L) 10/20/2019   K 4.7 10/20/2019   CL 95 (L) 10/20/2019   CO2 23 10/20/2019   Lab Results  Component Value Date   CHOL 191 10/31/2016   HDL 41.30 10/31/2016   LDLDIRECT 131.0 10/31/2016   TRIG 216.0 (H) 10/31/2016   CHOLHDL 5 10/31/2016   Lab Results  Component Value Date   TSH 1.880 10/20/2019   Lab Results  Component Value Date   HGBA1C 12.0 (H) 10/20/2019   Lab Results  Component Value Date   WBC 9.2 10/20/2019   HGB 16.1 (H) 10/20/2019   HCT 46.6 10/20/2019   MCV 90 10/20/2019   PLT 266 10/20/2019   Lab Results  Component Value Date   ALT 89 (H) 10/20/2019   AST 87 (H) 10/20/2019   ALKPHOS 133 (H) 10/20/2019   BILITOT 0.5 10/20/2019     Review of Systems  Constitutional: Positive for unexpected weight change and weight loss. Negative for appetite change, chills, fatigue and fever.  HENT: Positive for congestion and trouble swallowing. Negative for facial swelling, hearing loss and sore throat.   Eyes: Negative for visual disturbance.  Respiratory: Negative for cough, chest tightness and shortness of breath.     Cardiovascular: Positive for leg swelling. Negative for chest pain and palpitations.  Endocrine: Negative for polydipsia and polyuria.  Genitourinary: Negative for difficulty urinating, frequency and urgency.  Skin: Negative for color change and rash.  Neurological: Negative for dizziness, light-headedness, numbness and headaches.  Psychiatric/Behavioral: Negative for dysphoric mood and sleep disturbance. The patient is not nervous/anxious.     Patient Active Problem List   Diagnosis Date Noted  . Nasal sinus congestion 10/20/2019  . Weight loss, non-intentional 10/20/2019  . Hyperglycemia 10/20/2019  . Varicose veins of both lower extremities 10/20/2019  . Dysphagia 10/20/2019  . Osteoarthritis of right hand 10/31/2016  . Persistent atrial fibrillation ([place])   . Chronic combined systolic and diastolic heart failure, NYHA class 2 ([place]) 04/14/2016  . Nonischemic cardiomyopathy ([place]) 04/14/2016  . BMI 33.0-33.9,adult 04/14/2016    No Known Allergies  Past Surgical History:  Procedure Laterality Date  . CARDIAC CATHETERIZATION N/A 04/17/2016   Procedure: Left Heart Cath and Coronary Angiography;  Surgeon: Wellington Hampshire, MD;  Location: [facility];  Service: Cardiovascular;  Laterality: N/A;  . CHOLECYSTECTOMY    . ELECTROPHYSIOLOGIC STUDY N/A 05/26/2016   Procedure: Cardioversion;  Surgeon: Wellington Hampshire, MD;  Location: [facility];  Service: Cardiovascular;  Laterality: N/A;    Social History   Tobacco Use  . Smoking status: Never Smoker  . Smokeless tobacco: Never Used  Vaping Use  .  Vaping Use: Never used  Substance Use Topics  . Alcohol use: No  . Drug use: No     Medication list has been reviewed and updated.  Current Meds  Medication Sig  . apixaban (ELIQUIS) 5 MG TABS tablet Take 1 tablet (5 mg total) by mouth 2 (two) times daily.  . Cholecalciferol (VITAMIN D3) 1000 units CAPS Take 1 capsule by mouth 2 (two) times daily.   [name] CINNAMON PO Take by  mouth.  . CRANBERRY PO Take 1 capsule by mouth. Soft gel  . digoxin (LANOXIN) 0.25 MG tablet Take 1 tablet (0.25 mg total) by mouth daily.  . furosemide (LASIX) 20 MG tablet Take 20 mg by mouth daily as needed for fluid or edema.  Laurina Bustle Oil OIL by Does not apply route.  Koren Bound Oil OIL by Does not apply route.  . Lemongrass Oil OIL by Does not apply route.  . magnesium gluconate (MAGONATE) 500 MG tablet Take 500 mg by mouth daily.  [name] MELALEUCA [place] Inject into the skin. Oil  . metoprolol succinate (TOPROL-XL) 100 MG 24 hr tablet Take 1.5 tablets (150 mg) by mouth once daily. Take with or immediately following a meal.  . Multiple Vitamins-Minerals (MULTIVITAMIN WITH MINERALS) tablet Take 1 tablet by mouth daily.  . OIL OF OREGANO PO Take 1 application by mouth daily. To left big toe  . peppermint oil liquid by Does not apply route as needed.  . Tea Tree Oil OIL by Does not apply route. On big toe  . vitamin B-12 (CYANOCOBALAMIN) 1000 MCG tablet Take 1,000 mcg by mouth 2 (two) times daily.  . vitamin C (ASCORBIC ACID) 500 MG tablet Take 500 mg by mouth daily.  [name] zinc gluconate 50 MG tablet Take 50 mg by mouth daily.    PHQ 2/9 Scores 10/24/2019 10/20/2019  PHQ - 2 Score 0 0  PHQ- 9 Score 0 0    BP Readings from Last 3 Encounters:  10/24/19 120/72  10/20/19 108/76  08/21/19 123/67    Physical Exam Vitals and nursing note reviewed.  Constitutional:      General: She is not in acute distress.    Appearance: She is well-developed.  HENT:     Head: Normocephalic and atraumatic.  Pulmonary:     Effort: Pulmonary effort is normal. No respiratory distress.  Musculoskeletal:        General: Normal range of motion.  Skin:    General: Skin is warm and dry.     Findings: No rash.  Neurological:     Mental Status: She is alert and oriented to person, place, and time.  Psychiatric:        Attention and Perception: Attention normal.        Mood and Affect: Affect is tearful.         Behavior: Behavior normal.     Wt Readings from Last 3 Encounters:  10/24/19 215 lb (97.5 kg)  10/20/19 217 lb (98.4 kg)  08/21/19 223 lb (101.2 kg)    BP 120/72   Pulse (!) 105   Temp 97.9 F (36.6 C) (Oral)   Ht '5\' 7"'  (1.702 m)   Wt 215 lb (97.5 kg)   SpO2 95%   BMI 33.67 kg/m   Assessment and Plan: 1. Type II diabetes mellitus with complication (HCC) Discussed general diet changes needed and the rationale for starting metformin. She will call if the medication is not tolerated. Education referral done. At the next visit we will  obtain micro albumin and discuss statin therapy. - metFORMIN (GLUCOPHAGE-XR) 500 MG 24 hr tablet; Take 1 tablet (500 mg total) by mouth 2 (two) times daily before a meal.  Dispense: 60 tablet; Refill: 2 - blood glucose meter kit and supplies KIT; Dispense based on patient and insurance preference. Use 1-2 times per day to test BS  Dispense: 1 each; Refill: 0 - Ambulatory referral to diabetic education  2. Need for vaccination for pneumococcus Will give [id number] today and [id number] in one year - Pneumococcal conjugate vaccine 13-valent IM   Partially dictated using Editor, commissioning. Any errors are unintentional.  Halina Maidens, MD [facility]  10/24/2019

## 2019-11-03 ENCOUNTER — Telehealth: Payer: Self-pay | Admitting: Internal Medicine

## 2019-11-03 NOTE — Telephone Encounter (Signed)
Copied from [place] [id number]. Topic: General - Other >> Nov 03, 2019  4:14 PM Oneta Rack wrote: Reason for CRM:   patient received a letter from [facility] stating will cover Accu Check kit for 90 days. Once the 90 days is up insurance will cover [employer] and one touch glucose meter  Patient also wanted PCP to be aware her BS has dropped from 345 to 199 since her last appt

## 2019-11-04 NOTE — Telephone Encounter (Signed)
Noted  KP 

## 2019-11-14 ENCOUNTER — Encounter (INDEPENDENT_AMBULATORY_CARE_PROVIDER_SITE_OTHER): Payer: Self-pay

## 2019-11-14 ENCOUNTER — Encounter (INDEPENDENT_AMBULATORY_CARE_PROVIDER_SITE_OTHER): Payer: Self-pay | Admitting: Nurse Practitioner

## 2019-11-25 ENCOUNTER — Other Ambulatory Visit: Payer: Self-pay

## 2019-11-25 ENCOUNTER — Encounter: Payer: Medicare Other | Attending: Internal Medicine | Admitting: *Deleted

## 2019-11-25 ENCOUNTER — Encounter: Payer: Self-pay | Admitting: *Deleted

## 2019-11-25 VITALS — BP 118/74 | Ht 67.0 in | Wt 211.4 lb

## 2019-11-25 DIAGNOSIS — E118 Type 2 diabetes mellitus with unspecified complications: Secondary | ICD-10-CM | POA: Insufficient documentation

## 2019-11-25 DIAGNOSIS — E119 Type 2 diabetes mellitus without complications: Secondary | ICD-10-CM

## 2019-11-25 DIAGNOSIS — Z713 Dietary counseling and surveillance: Secondary | ICD-10-CM | POA: Insufficient documentation

## 2019-11-25 NOTE — Progress Notes (Signed)
Diabetes Self-Management Education  Visit Type: First/Initial  Appt. Start Time: 1045 Appt. End Time: 4128  11/25/2019  [name], identified by name and date of birth, is a 64 y.o. female with a diagnosis of Diabetes: Type 2.   ASSESSMENT  Blood pressure 118/74, height 5\' 7"  (1.702 m), weight 211 lb 6.4 oz (95.9 kg). Body mass index is 33.11 kg/m.   Diabetes Self-Management Education - 11/25/19 1340      Visit Information   Visit Type First/Initial      Initial Visit   Diabetes Type Type 2    Are you currently following a meal plan? Yes    What type of meal plan do you follow? "cut portions, more veggies, avoiding pure sugar"    Are you taking your medications as prescribed? Yes    Date Diagnosed October 20, 2019      Health Coping   How would you rate your overall health? Good      Psychosocial Assessment   Patient Belief/Attitude about Diabetes Motivated to manage diabetes   "defeated, ashamed, uncertain, celebratory when numbers are low"   Self-care barriers None    Self-management support Doctor's office    Patient Concerns Nutrition/Meal planning;Glycemic Control;Medication;Monitoring;Weight Control;Healthy Lifestyle    Special Needs None    Preferred Learning Style Visual;Hands on    Learning Readiness Change in progress    How often do you need to have someone help you when you read instructions, pamphlets, or other written materials from your doctor or pharmacy? 1 - Never    What is the last grade level you completed in school? BA      Pre-Education Assessment   Patient understands the diabetes disease and treatment process. Needs Instruction    Patient understands incorporating nutritional management into lifestyle. Needs Instruction    Patient undertands incorporating physical activity into lifestyle. Needs Instruction    Patient understands using medications safely. Needs Instruction    Patient understands monitoring blood glucose, interpreting and using  results Needs Review    Patient understands prevention, detection, and treatment of acute complications. Needs Instruction    Patient understands prevention, detection, and treatment of chronic complications. Needs Instruction    Patient understands how to develop strategies to address psychosocial issues. Needs Instruction    Patient understands how to develop strategies to promote health/change behavior. Needs Instruction      Complications   Last HgB A1C per patient/outside source 12 %   10/20/2019   How often do you check your blood sugar? 1-2 times/day    Fasting Blood glucose range (mg/dL) 130-179   Pt reports fasting blood sugars of 140-170 mg/dL.   Have you had a dilated eye exam in the past 12 months? No    Have you had a dental exam in the past 12 months? No    Are you checking your feet? Yes    How many days per week are you checking your feet? 1      Dietary Intake   Breakfast Greek yogurt with few blueberries and little ganola    Snack (morning) nuts, carrots, celery    Lunch 1/2 meat sandwich and fruit (cheeries, blueberries, peach, strawberries, cantaloupe    Snack (afternoon) fruit or nuts    Dinner beef, chicken, occasional fish with beans, peas, pasta, occaionsl potatoes, green beans, lettuce, tomatoes, broccoli, asparagus, zucchini, squash, brussells sprouts, cucumbers    Snack (evening) fruit or nuts, dry cereal    Beverage(s) water, coffee with minimal cream  Exercise   Exercise Type Light (walking / raking leaves)   walking, yardwork, stretching   How many days per week to you exercise? 2    How many minutes per day do you exercise? 15    Total minutes per week of exercise 30      Patient Education   Previous Diabetes Education No    Disease state  Definition of diabetes, type 1 and 2, and the diagnosis of diabetes;Factors that contribute to the development of diabetes    Nutrition management  Role of diet in the treatment of diabetes and the relationship  between the three main macronutrients and blood glucose level;Food label reading, portion sizes and measuring food.;Reviewed blood glucose goals for pre and post meals and how to evaluate the patients' food intake on their blood glucose level.    Physical activity and exercise  Role of exercise on diabetes management, blood pressure control and cardiac health.    Medications Reviewed patients medication for diabetes, action, purpose, timing of dose and side effects.    Monitoring Purpose and frequency of SMBG.;Taught/discussed recording of test results and interpretation of SMBG.;Identified appropriate SMBG and/or A1C goals.    Chronic complications Relationship between chronic complications and blood glucose control;Retinopathy and reason for yearly dilated eye exams    Psychosocial adjustment Identified and addressed patients feelings and concerns about diabetes      Individualized Goals (developed by patient)   Reducing Risk Other (comment)   improve blood sugars, decrease medications, prevent diabetes complications, lose weight, lead a healthier lifestyle, become more fit     Outcomes   Expected Outcomes Demonstrated interest in learning. Expect positive outcomes           Individualized Plan for Diabetes Self-Management Training:   Learning Objective:  Patient will have a greater understanding of diabetes self-management. Patient education plan is to attend individual and/or group sessions per assessed needs and concerns.   Plan:   Patient Instructions  Check blood sugars 1 x day before breakfast or 2 hrs after one meal every day Bring blood sugar records to the next class Exercise: Continue walking, yard work and stretching for  10-15 minutes 2-3 days a week and increase as tolerated Eat 3 meals day,  2  snacks a day Space meals 4-6 hours apart Limit fried foods May need to avoid fruit at bedtime if fasting blood sugars are elevated Make an eye doctor appointment  Expected  Outcomes:  Demonstrated interest in learning. Expect positive outcomes  Education material provided:  General Meal Planning Guidelines Simple Meal Plan  If problems or questions, patient to contact team via:  Johny Drilling, RN, [place] [phone]  Future DSME appointment:  July 19., 2021 for Diabetes Class 1

## 2019-11-25 NOTE — Patient Instructions (Signed)
Check blood sugars 1 x day before breakfast or 2 hrs after one meal every day Bring blood sugar records to the next class  Exercise: Continue walking, yard work and stretching for  10-15 minutes 2-3 days a week and increase as tolerated  Eat 3 meals day,  2  snacks a day Space meals 4-6 hours apart Limit fried foods May need to avoid fruit at bedtime if fasting blood sugars are elevated  Make an eye doctor appointment  Return for classes on:

## 2019-12-01 ENCOUNTER — Encounter: Payer: Self-pay | Admitting: Dietician

## 2019-12-01 ENCOUNTER — Encounter: Payer: Medicare Other | Admitting: Dietician

## 2019-12-01 ENCOUNTER — Other Ambulatory Visit: Payer: Self-pay

## 2019-12-01 VITALS — Ht 67.0 in | Wt 210.5 lb

## 2019-12-01 DIAGNOSIS — Z713 Dietary counseling and surveillance: Secondary | ICD-10-CM | POA: Diagnosis not present

## 2019-12-01 DIAGNOSIS — E118 Type 2 diabetes mellitus with unspecified complications: Secondary | ICD-10-CM | POA: Diagnosis not present

## 2019-12-01 DIAGNOSIS — E119 Type 2 diabetes mellitus without complications: Secondary | ICD-10-CM

## 2019-12-01 NOTE — Progress Notes (Signed)
Appt. Start Time: 1730 Appt. End Time: 2030  Class 1 Diabetes Overview - define DM; state own type of DM; identify functions of pancreas and insulin; define insulin deficiency vs insulin resistance  Psychosocial - identify DM as a source of stress; state the effects of stress on BG control  Nutritional Management - describe effects of food on blood glucose; identify sources of carbohydrate, protein and fat; verbalize the importance of balance meals in controlling blood glucose  Exercise - describe the effects of exercise on blood glucose and importance of regular exercise in controlling diabetes; state a plan for personal exercise; verbalize contraindications for exercise  Self-Monitoring - state importance of SMBG; use SMBG results to effectively manage diabetes; identify importance of regular HbA1C testing and goals for results  Acute Complications - recognize hyperglycemia and hypoglycemia with causes and effects; identify blood glucose results as high, low or in control; list steps in treating and preventing high and low blood glucose  Chronic Complications/Foot, Skin, Eye Dental Care - identify possible long-term complications of diabetes (retinopathy, neuropathy, nephropathy, cardiovascular disease, infections); state importance of daily self-foot exams; describe how to examine feet and what to look for; explain appropriate eye and dental care  Lifestyle Changes/Goals & Health/Community Resources - state benefits of making appropriate lifestyle changes; identify habits that need to change (meals, tobacco, alcohol); identify strategies to reduce risk factors for personal health  Pregnancy/Sexual Health - define gestational diabetes; state importance of good blood glucose control and birth control prior to pregnancy; state importance of good blood glucose control in preventing sexual problems (impotence, vaginal dryness, infections, loss of desire); state relationship of blood glucose control  and pregnancy outcome; describe risk of maternal and fetal complications  Teaching Materials Used: Class 1 Slides/Notebook Diabetes Booklet ID Card  Medic Alert/Medic ID Forms Sleep Evaluation Exercise Handout Planning a Balanced Meal Goals for Class 1   

## 2019-12-04 ENCOUNTER — Encounter (INDEPENDENT_AMBULATORY_CARE_PROVIDER_SITE_OTHER): Payer: Self-pay | Admitting: Nurse Practitioner

## 2019-12-04 ENCOUNTER — Encounter (INDEPENDENT_AMBULATORY_CARE_PROVIDER_SITE_OTHER): Payer: Self-pay

## 2019-12-08 ENCOUNTER — Encounter: Payer: Medicare Other | Admitting: *Deleted

## 2019-12-08 ENCOUNTER — Other Ambulatory Visit: Payer: Self-pay

## 2019-12-08 ENCOUNTER — Encounter: Payer: Self-pay | Admitting: *Deleted

## 2019-12-08 VITALS — Wt 212.9 lb

## 2019-12-08 DIAGNOSIS — Z713 Dietary counseling and surveillance: Secondary | ICD-10-CM | POA: Diagnosis not present

## 2019-12-08 DIAGNOSIS — E119 Type 2 diabetes mellitus without complications: Secondary | ICD-10-CM

## 2019-12-08 DIAGNOSIS — E118 Type 2 diabetes mellitus with unspecified complications: Secondary | ICD-10-CM | POA: Diagnosis not present

## 2019-12-08 NOTE — Progress Notes (Signed)
Appt. Start Time: 1730 Appt. End Time: 2030  Class 2 Nutritional Management - identify sources of carbohydrate, protein and fat; plan balanced meals; estimate servings of carbohydrates in meals  Psychosocial - identify DM as a source of stress; state the effects of stress on BG control  Exercise - describe the effects of exercise on blood glucose and importance of regular exercise in controlling diabetes; state a plan for personal exercise; verbalize contraindications for exercise  Self-Monitoring - state importance of SMBG; use SMBG results to effectively manage diabetes; identify importance of regular HbA1C testing and goals for results  Acute Complications - recognize hyperglycemia and hypoglycemia with causes and effects; identify blood glucose results as high, low or in control; list steps in treating and preventing high and low blood glucose  Sick Day Guidelines: state appropriate measure to manage blood glucose when ill (need for meds, HBGM plan, when to call physician, need for fluids)  Chronic Complications/Foot, Skin, Eye Dental Care - identify possible long-term complications of diabetes (retinopathy, neuropathy, nephropathy, cardiovascular disease, infections); explain steps in prevention and treatment of chronic complications; state importance of daily self-foot exams; describe how to examine feet and what to look for; explain appropriate eye and dental care  Lifestyle Changes/Goals - state benefits of making appropriate lifestyle changes; identify habits that need to change (meals, tobacco, alcohol); identify strategies to reduce risk factors for personal health  Pregnancy/Sexual Health - state importance of good blood glucose control in preventing sexual problems (impotence, vaginal dryness, infections, loss of desire)  Teaching Materials Used: Class 2 Slide Packet A1C Pamphlet Foot Care Literature Kidney Test Handout Stroke Card Quick and "Balanced" Meal Ideas Carb  Counting and Meal Planning Book Goals for Class 2 

## 2019-12-11 ENCOUNTER — Other Ambulatory Visit (INDEPENDENT_AMBULATORY_CARE_PROVIDER_SITE_OTHER): Payer: Self-pay | Admitting: Nurse Practitioner

## 2019-12-11 DIAGNOSIS — I8393 Asymptomatic varicose veins of bilateral lower extremities: Secondary | ICD-10-CM

## 2019-12-12 ENCOUNTER — Ambulatory Visit (INDEPENDENT_AMBULATORY_CARE_PROVIDER_SITE_OTHER): Payer: Medicare Other | Admitting: Nurse Practitioner

## 2019-12-12 ENCOUNTER — Encounter (INDEPENDENT_AMBULATORY_CARE_PROVIDER_SITE_OTHER): Payer: Self-pay | Admitting: Nurse Practitioner

## 2019-12-12 ENCOUNTER — Other Ambulatory Visit: Payer: Self-pay

## 2019-12-12 ENCOUNTER — Ambulatory Visit (INDEPENDENT_AMBULATORY_CARE_PROVIDER_SITE_OTHER): Payer: Medicare Other

## 2019-12-12 VITALS — BP 120/72 | HR 96 | Resp 16 | Ht 67.0 in | Wt 208.4 lb

## 2019-12-12 DIAGNOSIS — M19041 Primary osteoarthritis, right hand: Secondary | ICD-10-CM | POA: Diagnosis not present

## 2019-12-12 DIAGNOSIS — I8312 Varicose veins of left lower extremity with inflammation: Secondary | ICD-10-CM

## 2019-12-12 DIAGNOSIS — I8393 Asymptomatic varicose veins of bilateral lower extremities: Secondary | ICD-10-CM

## 2019-12-12 DIAGNOSIS — I8311 Varicose veins of right lower extremity with inflammation: Secondary | ICD-10-CM

## 2019-12-12 NOTE — Progress Notes (Signed)
Subjective:    Patient ID: Brandy [name], female    DOB: Apr 25, 1956, 64 y.o.   MRN: [id number] Chief Complaint  Patient presents with  . New Patient (Initial Visit)    ref [employer] [facility] varicose vein    The patient is seen for evaluation of symptomatic varicose veins. The patient relates burning and stinging which worsened steadily throughout the course of the day, particularly with standing. The patient also notes an aching and throbbing pain over the varicosities, particularly with prolonged dependent positions. The symptoms are significantly improved with elevation.  The patient also notes that during hot weather the symptoms are greatly intensified. The patient states the pain from the varicose veins interferes with work, daily exercise, shopping and household maintenance. At this point, the symptoms are persistent and severe enough that they're having a negative impact on lifestyle and are interfering with daily activities.  There is no history of DVT, PE or superficial thrombophlebitis. There is no history of ulceration or hemorrhage. The patient endorses a significant family history of varicose veins.   The patient has worn graduated compression in the past. At the present time the patient has been using over-the-counter analgesics.   Noninvasive studies today show no evidence of DVT or superficial venous thrombosis bilaterally.  The patient's right lower extremity has evidence of reflux in the great saphenous vein at the mid thigh extending to the proximal calf.  Vein diameters range from 0.21 cm to 0.33 cm.  The left lower extremity has reflux in the great saphenous vein at the saphenofemoral junction extending to the proximal calf.  The vein diameters range from 0.57 cm to 1.01.  There is also evidence of deep venous insufficiency in the left common femoral vein.   Review of Systems  Cardiovascular: Positive for leg swelling.  All other systems reviewed and are negative.        Objective:   Physical Exam Vitals reviewed.  HENT:     Head: Normocephalic.  Cardiovascular:     Rate and Rhythm: Normal rate and regular rhythm.     Pulses: Normal pulses.     Comments: Large varicosities on left leg from groin to ankle ranging from 4-5 mm Pulmonary:     Effort: Pulmonary effort is normal.  Neurological:     Mental Status: She is alert and oriented to person, place, and time.  Psychiatric:        Mood and Affect: Mood normal.        Behavior: Behavior normal.        Thought Content: Thought content normal.        Judgment: Judgment normal.     BP 120/72 (BP Location: Right Arm)   Pulse 96   Resp 16   Ht '5\' 7"'  (1.702 m)   Wt (!) 208 lb 6.4 oz (94.5 kg)   BMI 32.64 kg/m   Past Medical History:  Diagnosis Date  . Chicken pox   . Chronic systolic CHF (congestive heart failure) ([place])    a. 03/2016 Echo: Ef 15-20%, diff HK, ant AK;  b. 05/2016 Echo: Ef 30-35%, diff HK, mildly dil LA/RA.  . Diabetes mellitus without complication ([place])   . Hip discomfort    been going on for 40 years   . History of kidney stones   . Moderate mitral regurgitation    a. 03/2016 Echo: mod MR in setting of LV dysfxn.  [name] NICM (nonischemic cardiomyopathy) ([place])    a. 03/2016 Echo: EF 15-20%, diff HK,  ant AK, mod MR, mod dil LA, mildly dil RA;  b. 04/2016 Cath: nl cors;  c. 05/2016 Echo: EF 30-35%, diff HK.  . Obesity   . Obstructive sleep apnea    compliant with CPAP  . Persistent atrial fibrillation ([place])    a. Dx 03/2016;  b. CHA2DS2VASc = 2-->Eliquis 46m BID;  b. 05/2016 Failed DCCV x 4.  . Sleep apnea   . Visit for monitoring Tikosyn therapy 07/24/2016    Social History   Socioeconomic History  . Marital status: Widowed    Spouse name: Deceased  . Number of children: 1  . Years of education: Not on file  . Highest education level: Not on file  Occupational History  . Not on file  Tobacco Use  . Smoking status: Never Smoker  . Smokeless tobacco: Never Used  Vaping  Use  . Vaping Use: Never used  Substance and Sexual Activity  . Alcohol use: Not Currently  . Drug use: No  . Sexual activity: Not Currently    Partners: Male  Other Topics Concern  . Not on file  Social History Narrative   1 by birth 2 adopted   Social Determinants of Health   Financial Resource Strain:   . Difficulty of Paying Living Expenses:   Food Insecurity:   . Worried About RCharity fundraiserin the Last Year:   . RArboriculturistin the Last Year:   Transportation Needs:   . LFilm/video editor(Medical):   .Marland KitchenLack of Transportation (Non-Medical):   Physical Activity:   . Days of Exercise per Week:   . Minutes of Exercise per Session:   Stress:   . Feeling of Stress :   Social Connections:   . Frequency of Communication with Friends and Family:   . Frequency of Social Gatherings with Friends and Family:   . Attends Religious Services:   . Active Member of Clubs or Organizations:   . Attends CArchivistMeetings:   .Marland KitchenMarital Status:   Intimate Partner Violence:   . Fear of Current or Ex-Partner:   . Emotionally Abused:   .Marland KitchenPhysically Abused:   . Sexually Abused:     Past Surgical History:  Procedure Laterality Date  . CARDIAC CATHETERIZATION N/A 04/17/2016   Procedure: Left Heart Cath and Coronary Angiography;  Surgeon: MWellington Hampshire MD;  [facility];  Service: Cardiovascular;  Laterality: N/A;  . CHOLECYSTECTOMY    . ELECTROPHYSIOLOGIC STUDY N/A 05/26/2016   Procedure: Cardioversion;  Surgeon: MWellington Hampshire MD;  Location: [facility];  Service: Cardiovascular;  Laterality: N/A;    Family History  Problem Relation Age of Onset  . COPD Mother   . COPD Father   . Heart disease Father   . Hypertension Father   . Diabetes Paternal Aunt     No Known Allergies     Assessment & Plan:   1. Varicose veins of both lower extremities with inflammation Recommend  I have reviewed my previous  discussion with the patient  regarding  varicose veins and why they cause symptoms. Patient will continue  wearing graduated compression stockings class 1 on a daily basis, beginning first thing in the morning and removing them in the evening.    In addition, behavioral modification including elevation during the day was again discussed and this will continue.  The patient has utilized over the counter pain medications and has been exercising.  However, at this time conservative  therapy has not alleviated the patient's symptoms of leg pain and swelling  Recommend: laser ablation of the left followed by right great saphenous veins to eliminate the symptoms of pain and swelling of the lower extremities caused by the severe superficial venous reflux disease.   2. Osteoarthritis of right hand, unspecified osteoarthritis type Continue NSAID medications as already ordered, these medications have been reviewed and there are no changes at this time.  Continued activity and therapy was stressed.    Current Outpatient Medications on File Prior to Visit  Medication Sig Dispense Refill  . apixaban (ELIQUIS) 5 MG TABS tablet Take 1 tablet (5 mg total) by mouth 2 (two) times daily. 180 tablet 3  . blood glucose meter kit and supplies KIT Dispense based on patient and insurance preference. Use 1-2 times per day to test BS 1 each 0  . Cholecalciferol (VITAMIN D3) 1000 units CAPS Take 1 capsule by mouth 2 (two) times daily.     [name] CINNAMON PO Take 2 capsules by mouth daily.     [name] CRANBERRY PO Take 1 capsule by mouth daily as needed. Soft gel     . digoxin (LANOXIN) 0.25 MG tablet Take 1 tablet (0.25 mg total) by mouth daily. 30 tablet 8  . furosemide (LASIX) 20 MG tablet Take 20 mg by mouth daily as needed for fluid or edema.    Laurina Bustle Oil OIL Apply 1 application topically daily as needed.     Koren Bound Oil OIL Take 1 application by mouth daily as needed.     Cindy Hazy Oil OIL Apply 1 application topically daily as needed.     .  magnesium gluconate (MAGONATE) 500 MG tablet Take 200 mg by mouth daily.     [name] MELALEUCA [place] Apply 1 application topically daily as needed. Oil     . metFORMIN (GLUCOPHAGE-XR) 500 MG 24 hr tablet Take 1 tablet (500 mg total) by mouth 2 (two) times daily before a meal. 60 tablet 2  . metoprolol succinate (TOPROL-XL) 100 MG 24 hr tablet Take 1.5 tablets (150 mg) by mouth once daily. Take with or immediately following a meal. 135 tablet 3  . Multiple Vitamins-Minerals (MULTIVITAMIN WITH MINERALS) tablet Take 1 tablet by mouth daily.    . OIL OF OREGANO PO Take 1 application by mouth daily. To left big toe    . peppermint oil liquid Apply 1 application topically daily as needed.     . zinc gluconate 50 MG tablet Take 50 mg by mouth daily.    . vitamin C (ASCORBIC ACID) 500 MG tablet Take 500 mg by mouth daily. (Patient not taking: Reported on 11/25/2019)     No current facility-administered medications on file prior to visit.    There are no Patient Instructions on file for this visit. No follow-ups on file.   Kris Hartmann, NP

## 2019-12-15 ENCOUNTER — Telehealth: Payer: Self-pay

## 2019-12-15 ENCOUNTER — Encounter: Payer: Self-pay | Admitting: Gastroenterology

## 2019-12-15 ENCOUNTER — Encounter: Payer: Self-pay | Admitting: Dietician

## 2019-12-15 ENCOUNTER — Ambulatory Visit: Payer: Medicare Other | Admitting: Gastroenterology

## 2019-12-15 ENCOUNTER — Other Ambulatory Visit: Payer: Self-pay

## 2019-12-15 ENCOUNTER — Telehealth: Payer: Self-pay | Admitting: *Deleted

## 2019-12-15 ENCOUNTER — Encounter: Payer: Medicare Other | Attending: Internal Medicine | Admitting: Dietician

## 2019-12-15 VITALS — BP 100/70 | Ht 67.0 in | Wt 210.1 lb

## 2019-12-15 DIAGNOSIS — R748 Abnormal levels of other serum enzymes: Secondary | ICD-10-CM

## 2019-12-15 DIAGNOSIS — R1310 Dysphagia, unspecified: Secondary | ICD-10-CM

## 2019-12-15 DIAGNOSIS — R1319 Other dysphagia: Secondary | ICD-10-CM

## 2019-12-15 DIAGNOSIS — E118 Type 2 diabetes mellitus with unspecified complications: Secondary | ICD-10-CM | POA: Insufficient documentation

## 2019-12-15 DIAGNOSIS — E119 Type 2 diabetes mellitus without complications: Secondary | ICD-10-CM

## 2019-12-15 DIAGNOSIS — Z713 Dietary counseling and surveillance: Secondary | ICD-10-CM | POA: Diagnosis not present

## 2019-12-15 DIAGNOSIS — Z1211 Encounter for screening for malignant neoplasm of colon: Secondary | ICD-10-CM

## 2019-12-15 MED ORDER — PEG 3350-KCL-NA BICARB-NACL 420 G PO SOLR
ORAL | 0 refills | Status: DC
Start: 2019-12-15 — End: 2020-01-26

## 2019-12-15 NOTE — Telephone Encounter (Signed)
    [facility] Pre-operative Risk Assessment    HEARTCARE STAFF: - Please ensure there is not already an duplicate clearance open for this procedure. - Under Visit Info/Reason for Call, type in Other and utilize the format Clearance MM/DD/YY or Clearance TBD. Do not use dashes or single digits. - If request is for dental extraction, please clarify the # of teeth to be extracted.  Request for surgical clearance:  1. What type of surgery is being performed? EGD AND COLONOSCOPY   2. When is this surgery scheduled? 01/12/20   3. What type of clearance is required (medical clearance vs. Pharmacy clearance to hold med vs. Both)? PHARM  4. Are there any medications that need to be held prior to surgery and how long? ELIQUIS   5. Practice name and name of physician performing surgery? [facility]; DR. Margretta Sidle [name]   6. What is the office phone number? [phone]   7.   What is the office fax number? [place]: Brandy [name]  8.   Anesthesia type (None, local, MAC, general) ? NOT LISTED   Brandy [name] 12/15/2019, 5:44 PM  _________________________________________________________________   (provider comments below)

## 2019-12-15 NOTE — Telephone Encounter (Signed)
I faxed an Eliquis clearance to Dr. Loyal Jacobson office to stop patient's medication before her procedure. Now awaiting on his response and then I will call the patient to let her know.

## 2019-12-15 NOTE — Progress Notes (Signed)

## 2019-12-15 NOTE — Progress Notes (Signed)
Brandy [name] [address]  [place]  [place], [place] [address]  Main: [phone]  Fax: [phone]   Gastroenterology Consultation  Referring Provider:     Glean Hess, MD Primary Care Physician:  Glean Hess, MD Reason for Consultation:     Dysphagia        HPI:    Chief Complaint  Patient presents with  . Dysphagia    Patient has difficulty swallowing her food from time to time on/off.    Brandy [name] is a 64 y.o. y/o female referred for consultation & management  by Dr. Army Melia, Jesse Sans, MD.  Patient reports intermittent history of solid food dysphagia for 3 to 4 years intermittently.  Occurs to potatoes, meats.  No episodes of food impaction.  States it occurs she eventually passes.  Reports 60 pound intentional weight loss over the last 1 year, through portion control.  No blood in stool.  Reports history of a colonoscopy 15 years ago she was told she has hemorrhoids.  No family history of colon cancer.  No prior upper endoscopy.  Past Medical History:  Diagnosis Date  . Chicken pox   . Chronic systolic CHF (congestive heart failure) ([place])    a. 03/2016 Echo: Ef 15-20%, diff HK, ant AK;  b. 05/2016 Echo: Ef 30-35%, diff HK, mildly dil LA/RA.  . Diabetes mellitus without complication ([place])   . Hip discomfort    been going on for 40 years   . History of kidney stones   . Moderate mitral regurgitation    a. 03/2016 Echo: mod MR in setting of LV dysfxn.  [name] NICM (nonischemic cardiomyopathy) ([place])    a. 03/2016 Echo: EF 15-20%, diff HK, ant AK, mod MR, mod dil LA, mildly dil RA;  b. 04/2016 Cath: nl cors;  c. 05/2016 Echo: EF 30-35%, diff HK.  . Obesity   . Obstructive sleep apnea    compliant with CPAP  . Persistent atrial fibrillation ([place])    a. Dx 03/2016;  b. CHA2DS2VASc = 2-->Eliquis 75m BID;  b. 05/2016 Failed DCCV x 4.  . Sleep apnea   . Visit for monitoring Tikosyn therapy 07/24/2016    Past Surgical History:  Procedure Laterality Date   . CARDIAC CATHETERIZATION N/A 04/17/2016   Procedure: Left Heart Cath and Coronary Angiography;  Surgeon: MWellington Hampshire MD;  Location: [facility];  Service: Cardiovascular;  Laterality: N/A;  . CHOLECYSTECTOMY    . ELECTROPHYSIOLOGIC STUDY N/A 05/26/2016   Procedure: Cardioversion;  Surgeon: MWellington Hampshire MD;  Location: [facility];  Service: Cardiovascular;  Laterality: N/A;    Prior to Admission medications   Medication Sig Start Date End Date Taking? Authorizing Provider  apixaban (ELIQUIS) 5 MG TABS tablet Take 1 tablet (5 mg total) by mouth 2 (two) times daily. 04/24/19  Yes KDeboraha Sprang MD  blood glucose meter kit and supplies KIT Dispense based on patient and insurance preference. Use 1-2 times per day to test BS 10/24/19  Yes BGlean Hess MD  Cholecalciferol (VITAMIN D3) 1000 units CAPS Take 1 capsule by mouth 2 (two) times daily.    Yes [provider]  CINNAMON PO Take 2 capsules by mouth daily.    Yes [provider]  CRANBERRY PO Take 1 capsule by mouth daily as needed. Soft gel    Yes [provider]  digoxin (LANOXIN) 0.25 MG tablet Take 1 tablet (0.25 mg total) by mouth daily. 02/04/19  Yes Deboraha Sprang, MD  furosemide (LASIX) 20 MG tablet Take 20 mg by mouth daily as needed for fluid or edema.   Yes [provider]  Lavender Oil OIL Apply 1 application topically daily as needed.    Yes [provider]  Lovell Sheehan OIL Take 1 application by mouth daily as needed.    Yes [provider]  Lemongrass Oil OIL Apply 1 application topically daily as needed.    Yes [provider]  magnesium gluconate (MAGONATE) 500 MG tablet Take 200 mg by mouth daily.    Yes [provider]  MELALEUCA [place] Apply 1 application topically daily as needed. Oil    Yes [provider]  metFORMIN (GLUCOPHAGE-XR) 500 MG 24 hr tablet Take 1 tablet (500 mg total) by mouth 2 (two) times daily before a meal.  10/24/19  Yes Glean Hess, MD  metoprolol succinate (TOPROL-XL) 100 MG 24 hr tablet Take 1.5 tablets (150 mg) by mouth once daily. Take with or immediately following a meal. 04/25/19  Yes Deboraha Sprang, MD  Multiple Vitamins-Minerals (MULTIVITAMIN WITH MINERALS) tablet Take 1 tablet by mouth daily.   Yes [provider]  OIL OF OREGANO PO Take 1 application by mouth daily. To left big toe   Yes [provider]  peppermint oil liquid Apply 1 application topically daily as needed.    Yes [provider]  vitamin C (ASCORBIC ACID) 500 MG tablet Take 500 mg by mouth daily.    Yes [provider]  zinc gluconate 50 MG tablet Take 50 mg by mouth daily.   Yes [provider]  polyethylene glycol-electrolytes (NULYTELY) 420 g solution Prepare according to package instructions. Starting at 5:00 PM: Drink one 8 oz glass of mixture every 15 minutes until you finish half of the jug. Five hours prior to procedure, drink 8 oz glass of mixture every 15 minutes until it is all gone. Make sure you do not drink anything 4 hours prior to your procedure. 12/15/19   Virgel Manifold, MD    Family History  Problem Relation Age of Onset  . COPD Mother   . COPD Father   . Heart disease Father   . Hypertension Father   . Diabetes Paternal Aunt      Social History   Tobacco Use  . Smoking status: Never Smoker  . Smokeless tobacco: Never Used  Vaping Use  . Vaping Use: Never used  Substance Use Topics  . Alcohol use: Not Currently  . Drug use: No    Allergies as of 12/15/2019  . (No Known Allergies)    Review of Systems:    All systems reviewed and negative except where noted in HPI.   Physical Exam:  There were no vitals taken for this visit. No LMP recorded. Patient is postmenopausal. Psych:  Alert and cooperative. Normal mood and affect. General:   Alert,  Well-developed, well-nourished, pleasant and cooperative in NAD Head:  Normocephalic and  atraumatic. Eyes:  Sclera clear, no icterus.   Conjunctiva pink. Ears:  Normal auditory acuity. Nose:  No deformity, discharge, or lesions. Mouth:  No deformity or lesions,oropharynx pink & moist. Neck:  Supple; no masses or thyromegaly. Abdomen:  Normal bowel sounds.  No bruits.  Soft, non-tender and non-distended without masses, hepatosplenomegaly or hernias noted.  No guarding or rebound tenderness.    Msk:  Symmetrical without gross deformities. Good, equal movement & strength bilaterally. Pulses:  Normal pulses noted. Extremities:  No clubbing or edema.  No cyanosis. Neurologic:  Alert and oriented x3;  grossly normal neurologically. Skin:  Intact without significant lesions or rashes. No jaundice. Lymph Nodes:  No significant cervical adenopathy. Psych:  Alert and cooperative. Normal mood and affect.   Labs: CBC    Component Value Date/Time   WBC 9.2 10/20/2019 1520   WBC 15.6 (H) 06/23/2016 1704   RBC 5.16 10/20/2019 1520   RBC 4.82 06/23/2016 1704   HGB 16.1 (H) 10/20/2019 1520   HCT 46.6 10/20/2019 1520   PLT 266 10/20/2019 1520   MCV 90 10/20/2019 1520   MCV 90 07/31/2011 0930   MCH 31.2 10/20/2019 1520   MCH 30.1 06/23/2016 1704   MCHC 34.5 10/20/2019 1520   MCHC 34.5 06/23/2016 1704   RDW 11.7 10/20/2019 1520   RDW 11.8 07/31/2011 0930   LYMPHSABS 3.4 (H) 10/20/2019 1520   LYMPHSABS 2.4 07/31/2011 0930   MONOABS 1.1 (H) 04/12/2016 1818   MONOABS 0.5 07/31/2011 0930   EOSABS 0.2 10/20/2019 1520   EOSABS 0.2 07/31/2011 0930   BASOSABS 0.1 10/20/2019 1520   BASOSABS 0.1 07/31/2011 0930   CMP     Component Value Date/Time   NA 133 (L) 10/20/2019 1520   NA 141 07/31/2011 0930   K 4.7 10/20/2019 1520   K 4.8 07/31/2011 0930   CL 95 (L) 10/20/2019 1520   CL 104 07/31/2011 0930   CO2 23 10/20/2019 1520   CO2 28 07/31/2011 0930   GLUCOSE 365 (H) 10/20/2019 1520   GLUCOSE 150 (H) 07/04/2017 1325   GLUCOSE 122 (H) 07/31/2011 0930   BUN 12 10/20/2019 1520    BUN 11 07/31/2011 0930   CREATININE 1.01 (H) 10/20/2019 1520   CREATININE 1.04 07/31/2011 0930   CALCIUM 10.0 10/20/2019 1520   CALCIUM 9.7 07/31/2011 0930   PROT 7.8 10/20/2019 1520   PROT 8.4 (H) 07/31/2011 0930   ALBUMIN 4.8 10/20/2019 1520   ALBUMIN 4.5 07/31/2011 0930   AST 87 (H) 10/20/2019 1520   AST 27 07/31/2011 0930   ALT 89 (H) 10/20/2019 1520   ALT 63 07/31/2011 0930   ALKPHOS 133 (H) 10/20/2019 1520   ALKPHOS 108 07/31/2011 0930   BILITOT 0.5 10/20/2019 1520   BILITOT 0.5 07/31/2011 0930   GFRNONAA 59 (L) 10/20/2019 1520   GFRNONAA 58 (L) 07/31/2011 0930   GFRAA 68 10/20/2019 1520   GFRAA >60 07/31/2011 0930    Imaging Studies: VAS [place] LOWER EXTREMITY VENOUS REFLUX  Result Date: 12/12/2019  Lower Venous Reflux Study Indications: Varicosities.  Performing Technologist: Blondell Reveal RT, RDMS, RVT  Examination Guidelines: A complete evaluation includes B-mode imaging, spectral Doppler, color Doppler, and power Doppler as needed of all accessible portions of each vessel. Bilateral testing is considered an integral part of a complete examination. Limited examinations for reoccurring indications may be performed as noted. The reflux portion of the exam is performed with the patient in reverse Trendelenburg. Significant venous reflux is defined as >500 ms in the superficial venous system, and >1 second in the deep venous system.  Venous Reflux Times +--------------+---------+------+-----------+------------+--------+ RIGHT         Reflux NoRefluxReflux TimeDiameter cmsComments                         Yes                                  +--------------+---------+------+-----------+------------+--------+  CFV           no                                             +--------------+---------+------+-----------+------------+--------+ FV mid        no                                             +--------------+---------+------+-----------+------------+--------+  Popliteal     no                                             +--------------+---------+------+-----------+------------+--------+ GSV at [facility]    no                            0.51             +--------------+---------+------+-----------+------------+--------+ GSV prox thighno                            0.37             +--------------+---------+------+-----------+------------+--------+ GSV mid thigh           yes    >500 ms      0.33             +--------------+---------+------+-----------+------------+--------+ GSV dist thigh          yes    >500 ms      0.29             +--------------+---------+------+-----------+------------+--------+ GSV at knee             yes    >500 ms      0.27             +--------------+---------+------+-----------+------------+--------+ GSV prox calf           yes    >500 ms      0.21             +--------------+---------+------+-----------+------------+--------+ SSV prox calf no                                             +--------------+---------+------+-----------+------------+--------+  +--------------+---------+------+-----------+------------+---------------------+ LEFT          Reflux NoRefluxReflux TimeDiameter cmsComments                                      Yes                                               +--------------+---------+------+-----------+------------+---------------------+ CFV                     yes   >1 second                                   +--------------+---------+------+-----------+------------+---------------------+  FV mid        no                                                          +--------------+---------+------+-----------+------------+---------------------+ Popliteal     no                                                          +--------------+---------+------+-----------+------------+---------------------+ GSV at SFJ              yes    >500 ms       0.93                          +--------------+---------+------+-----------+------------+---------------------+ GSV prox thigh          yes    >500 ms      0.87                          +--------------+---------+------+-----------+------------+---------------------+ GSV mid thigh           yes    >500 ms      1.01    mid/distal thigh                                                          varicosities          +--------------+---------+------+-----------+------------+---------------------+ GSV dist thighno                            0.17                          +--------------+---------+------+-----------+------------+---------------------+ GSV at knee             yes    >500 ms      0.86                          +--------------+---------+------+-----------+------------+---------------------+ GSV prox calf           yes                 0.57    varicosities          +--------------+---------+------+-----------+------------+---------------------+ SSV prox calf no                                                          +--------------+---------+------+-----------+------------+---------------------+  Summary: Right: - No evidence of deep vein thrombosis seen in the right lower extremity, from the common femoral through the popliteal veins. - No evidence of superficial venous thrombosis in the right lower extremity. - No evidence of superficial venous reflux seen in the right short  saphenous vein. - Venous reflux is noted in the right greater saphenous vein in the thigh. - Venous reflux is noted in the right greater saphenous vein in the calf.  Left: - No evidence of deep vein thrombosis seen in the left lower extremity, from the common femoral through the popliteal veins. - No evidence of superficial venous thrombosis in the left lower extremity. - No evidence of superficial venous reflux seen in the left short saphenous vein. - Venous reflux is noted in the left  common femoral vein. - Venous reflux is noted in the left greater saphenous vein in the thigh. - Venous reflux is noted in the left greater saphenous vein in the calf.  *See table(s) above for measurements and observations. Electronically signed by Leotis Pain MD on 12/12/2019 at 10:51:23 AM.    Final     Assessment and Plan:   Brandy [name] is a 64 y.o. y/o female has been referred for dysphagia  EGD indicated to rule obstructive lesions  Colonoscopy indicated for screening  Patient also found to have elevated liver enzymes and has been taking over-the-counter supplements.  Doterra lifelong vitality pack is one thing she mentioned.   We discussed that over-the-counter supplements like these canhave unknown adverse effects including repeat liver enzymes.  His liver enzymes are still elevated on repeat testing, I would advise her to stop this supplement  Avoid hepatotoxic drugs.  Patient only reports very occasional alcohol use  I have discussed alternative options, risks & benefits,  which include, but are not limited to, bleeding, infection, perforation,respiratory complication & drug reaction.  The patient agrees with this plan & written consent will be obtained.      Dr Brandy [name]  Speech recognition software was used to dictate the above note.

## 2019-12-16 LAB — HEPATIC FUNCTION PANEL
ALT: 40 IU/L — ABNORMAL HIGH (ref 0–32)
AST: 41 IU/L — ABNORMAL HIGH (ref 0–40)
Albumin: 4.3 g/dL (ref 3.8–4.8)
Alkaline Phosphatase: 87 IU/L (ref 48–121)
Bilirubin Total: 0.5 mg/dL (ref 0.0–1.2)
Bilirubin, Direct: 0.11 mg/dL (ref 0.00–0.40)
Total Protein: 7.8 g/dL (ref 6.0–8.5)

## 2019-12-16 MED ORDER — APIXABAN 5 MG PO TABS: 5.0000 mg | ORAL_TABLET | Freq: Two times a day (BID) | ORAL | 1 refills | Status: DC

## 2019-12-16 NOTE — Telephone Encounter (Signed)
Patient with diagnosis of afib on Eliquis for anticoagulation.    Procedure: EGD and colonoscopy Date of procedure: 01/12/20  CHADS2-VASc score of 3 (age, sex, CHF)  CrCl 42mL/min Platelet count 266K  Per office protocol, patient can hold Eliquis for 1-2 days prior to procedure.

## 2019-12-16 NOTE — Telephone Encounter (Signed)
Clinical pharmacist to review Eliquis 

## 2019-12-17 ENCOUNTER — Encounter: Payer: Self-pay | Admitting: *Deleted

## 2019-12-17 NOTE — Telephone Encounter (Signed)
We received a letter from Dr. Olin Pia office letting us know that the patient is able to stop her Eliquis 1-2 days prior to her procedure on 01/12/2020 and restart after her procedure. I then called patient to let her know and she understood and had no further questions.

## 2019-12-19 ENCOUNTER — Telehealth: Payer: Self-pay

## 2019-12-19 DIAGNOSIS — R748 Abnormal levels of other serum enzymes: Secondary | ICD-10-CM

## 2019-12-19 NOTE — Telephone Encounter (Signed)
Called patient and left her a detailed message letting her know of her lab results and what Dr. Bonna Gains recommended for her. I will place the lab order today and patient is to have it drawn in 3-4 weeks. I will also send her a MyChart message with this information.

## 2019-12-19 NOTE — Telephone Encounter (Signed)
Your liver enzymes are still elevated, but have improved from before. I would recommend that you stop the dietary supplement that you have been taking. Plan would be to repeat your liver enzymes in the next few weeks and if still elevated, obtain further work-up.

## 2019-12-19 NOTE — Telephone Encounter (Signed)
-----   Message from Virgel Manifold, MD sent at 12/18/2019  1:02 PM EDT ----- Herb Grays please order CMP to be done in 3 to 4 weeks.  Please see my chart, comment that I attached to this result that I sent to the patient already

## 2019-12-23 ENCOUNTER — Telehealth: Payer: Self-pay | Admitting: Internal Medicine

## 2019-12-23 NOTE — Telephone Encounter (Signed)
Pt c/o medication issue:  1. Name of Medication: digoxin (LANOXIN) 0.25 MG tablet Metformin 500 MG  2. How are you currently taking this medication (dosage and times per day)? Digoxin 1 tablet by mouth daily & Metformin 1 tablet by mouth two times daily    3. Are you having a reaction (difficulty breathing--STAT)? No   4. What is your medication issue? Soo is calling stating she is about to run out of this medication and would like to know if Dr. Caryl Comes still feels she needs to be on it before having it filled. Annaleigh also wanted to inform Dr. Caryl Comes she is a diagnosed diabetic and has been taking metformin for about two months and it has been helping tremendously. Her next appointment with her Doctor in regards to this is in the beginning of September. Please advise.

## 2019-12-24 MED ORDER — DIGOXIN 250 MCG PO TABS: 0.2500 mg | ORAL_TABLET | Freq: Every day | ORAL | 11 refills | Status: DC

## 2019-12-24 NOTE — Telephone Encounter (Signed)
Spoke with pt who states she thought Digoxin was only a temporary medication.  Pt advised there is no mention in Dr Olin Pia last note from 08/2019 of plans to discontinue Digoxin.  Pt's next appointment scheduled for 03/2020. Pt advised to continue medication as prescribed and keep November appointment.  Will refill Digoxin.  Informed pt glad the Metformin is helping her as well.  Continue follow up appointment's with that prescribing provider as well.  Pt verbalizes understanding and agrees with current plan.

## 2020-01-01 ENCOUNTER — Other Ambulatory Visit: Payer: Self-pay

## 2020-01-01 ENCOUNTER — Encounter: Payer: Self-pay | Admitting: Gastroenterology

## 2020-01-02 ENCOUNTER — Telehealth: Payer: Self-pay | Admitting: Internal Medicine

## 2020-01-02 NOTE — Telephone Encounter (Signed)
Spoke with pt who reports her HR has been elevated between 110-130 at times since yesterday.  Pt reports she was receiving instructions yesterday for up coming EGD procedure 08/30 and feels the elevated heart rate could be related to anxiety but wanted to make Dr Caryl Comes aware so if she needed to cancel procedure she could.  Pt reports no additional symptoms at this time but did take an extra 1/4 tablet of her Metoprolol be cause she "just felt like she needed something extra."  Pt states she is taking other medications as prescribed without any missed doses.  Pt advised to continue to monitor HR and take medications as prescribed.  Will forward information to Dr Caryl Comes for review.  Reviewed ED precautions with pt who verbalizes understanding and agrees with current plan.

## 2020-01-02 NOTE — Telephone Encounter (Signed)
° ° °  STAT if HR is under 50 or over 120 (normal HR is 60-100 beats per minute)  1) What is your heart rate? 110  2) Do you have a log of your heart rate readings (document readings)?   3) Do you have any other symptoms? Pt feels shaky, she said she has upcoming procedure and thinking if she needs to see Dr. Caryl [name] first since her HR is elevated.

## 2020-01-06 NOTE — Telephone Encounter (Signed)
Ntoed

## 2020-01-08 ENCOUNTER — Other Ambulatory Visit: Payer: Self-pay

## 2020-01-08 ENCOUNTER — Other Ambulatory Visit
Admission: RE | Admit: 2020-01-08 | Discharge: 2020-01-08 | Disposition: A | Discharge status: Home / Self Care | Payer: Medicare Other | Source: Ambulatory Visit | Attending: Gastroenterology | Admitting: Gastroenterology

## 2020-01-08 DIAGNOSIS — Z01812 Encounter for preprocedural laboratory examination: Secondary | ICD-10-CM | POA: Insufficient documentation

## 2020-01-08 DIAGNOSIS — Z20822 Contact with and (suspected) exposure to covid-19: Secondary | ICD-10-CM | POA: Insufficient documentation

## 2020-01-08 LAB — SARS CORONAVIRUS 2 (TAT 6-24 HRS): SARS Coronavirus 2: NEGATIVE

## 2020-01-09 ENCOUNTER — Telehealth: Payer: Self-pay | Admitting: Internal Medicine

## 2020-01-09 NOTE — Telephone Encounter (Signed)
Called pt left VM to call back.  Need fax # to send it to. Last Mamm. Is from 04/2018.  Will route result note to [facility] Nurse Triage for follow up when patient returns call to clinic.    KP

## 2020-01-09 NOTE — Telephone Encounter (Signed)
Pt. Would like last mammogram and PAP faxed to [facility] [phone] to Dr. Georga Bora.

## 2020-01-09 NOTE — Telephone Encounter (Signed)
Copied from [place] [phone]. Topic: General - Other >> Jan 09, 2020 10:09 AM Keene Breath wrote: Reason for CRM: Patient would like the nurse to call her to have her recent mammo report faxed to a doctor's office.  Please advise and call patient to get more details.  CB# [phone]

## 2020-01-09 NOTE — Telephone Encounter (Signed)
Mamm. Is faxed.  KP

## 2020-01-09 NOTE — Discharge Instructions (Signed)
General Anesthesia, Adult, Care After This sheet gives you information about how to care for yourself after your procedure. Your health care provider may also give you more specific instructions. If you have problems or questions, contact your health care provider. What can I expect after the procedure? After the procedure, the following side effects are common:  Pain or discomfort at the IV site.  Nausea.  Vomiting.  Sore throat.  Trouble concentrating.  Feeling cold or chills.  Weak or tired.  Sleepiness and fatigue.  Soreness and body aches. These side effects can affect parts of the body that were not involved in surgery. Follow these instructions at home:  For at least 24 hours after the procedure:  Have a responsible adult stay with you. It is important to have someone help care for you until you are awake and alert.  Rest as needed.  Do not: ? Participate in activities in which you could fall or become injured. ? Drive. ? Use heavy machinery. ? Drink alcohol. ? Take sleeping pills or medicines that cause drowsiness. ? Make important decisions or sign legal documents. ? Take care of children on your own. Eating and drinking  Follow any instructions from your health care provider about eating or drinking restrictions.  When you feel hungry, start by eating small amounts of foods that are soft and easy to digest (bland), such as toast. Gradually return to your regular diet.  Drink enough fluid to keep your urine pale yellow.  If you vomit, rehydrate by drinking water, juice, or clear broth. General instructions  If you have sleep apnea, surgery and certain medicines can increase your risk for breathing problems. Follow instructions from your health care provider about wearing your sleep device: ? Anytime you are sleeping, including during daytime naps. ? While taking prescription pain medicines, sleeping medicines, or medicines that make you drowsy.  Return to  your normal activities as told by your health care provider. Ask your health care provider what activities are safe for you.  Take over-the-counter and prescription medicines only as told by your health care provider.  If you smoke, do not smoke without supervision.  Keep all follow-up visits as told by your health care provider. This is important. Contact a health care provider if:  You have nausea or vomiting that does not get better with medicine.  You cannot eat or drink without vomiting.  You have pain that does not get better with medicine.  You are unable to pass urine.  You develop a skin rash.  You have a fever.  You have redness around your IV site that gets worse. Get help right away if:  You have difficulty breathing.  You have chest pain.  You have blood in your urine or stool, or you vomit blood. Summary  After the procedure, it is common to have a sore throat or nausea. It is also common to feel tired.  Have a responsible adult stay with you for the first 24 hours after general anesthesia. It is important to have someone help care for you until you are awake and alert.  When you feel hungry, start by eating small amounts of foods that are soft and easy to digest (bland), such as toast. Gradually return to your regular diet.  Drink enough fluid to keep your urine pale yellow.  Return to your normal activities as told by your health care provider. Ask your health care provider what activities are safe for you. This information is not   intended to replace advice given to you by your health care provider. Make sure you discuss any questions you have with your health care provider. Document Revised: 05/04/2017 Document Reviewed: 12/15/2016 Elsevier Patient Education  2020 [employer]  

## 2020-01-12 ENCOUNTER — Ambulatory Visit
Admission: RE | Admit: 2020-01-12 | Discharge: 2020-01-12 | Disposition: A | Discharge status: Home / Self Care | Payer: Medicare Other | Attending: Gastroenterology | Admitting: Gastroenterology

## 2020-01-12 ENCOUNTER — Ambulatory Visit: Payer: Medicare Other | Admitting: Anesthesiology

## 2020-01-12 ENCOUNTER — Other Ambulatory Visit: Payer: Self-pay

## 2020-01-12 ENCOUNTER — Encounter
Admission: RE | Disposition: A | Discharge status: Home / Self Care | Payer: Self-pay | Source: Home / Self Care | Attending: Gastroenterology

## 2020-01-12 DIAGNOSIS — K3189 Other diseases of stomach and duodenum: Secondary | ICD-10-CM

## 2020-01-12 DIAGNOSIS — K5730 Diverticulosis of large intestine without perforation or abscess without bleeding: Secondary | ICD-10-CM | POA: Diagnosis not present

## 2020-01-12 DIAGNOSIS — E669 Obesity, unspecified: Secondary | ICD-10-CM | POA: Diagnosis not present

## 2020-01-12 DIAGNOSIS — Z6831 Body mass index (BMI) 31.0-31.9, adult: Secondary | ICD-10-CM | POA: Insufficient documentation

## 2020-01-12 DIAGNOSIS — Z1211 Encounter for screening for malignant neoplasm of colon: Secondary | ICD-10-CM | POA: Insufficient documentation

## 2020-01-12 DIAGNOSIS — D125 Benign neoplasm of sigmoid colon: Secondary | ICD-10-CM | POA: Insufficient documentation

## 2020-01-12 DIAGNOSIS — D120 Benign neoplasm of cecum: Secondary | ICD-10-CM | POA: Diagnosis not present

## 2020-01-12 DIAGNOSIS — E119 Type 2 diabetes mellitus without complications: Secondary | ICD-10-CM | POA: Diagnosis not present

## 2020-01-12 DIAGNOSIS — I5022 Chronic systolic (congestive) heart failure: Secondary | ICD-10-CM | POA: Insufficient documentation

## 2020-01-12 DIAGNOSIS — R1310 Dysphagia, unspecified: Secondary | ICD-10-CM

## 2020-01-12 DIAGNOSIS — G4733 Obstructive sleep apnea (adult) (pediatric): Secondary | ICD-10-CM | POA: Insufficient documentation

## 2020-01-12 DIAGNOSIS — D122 Benign neoplasm of ascending colon: Secondary | ICD-10-CM | POA: Diagnosis not present

## 2020-01-12 DIAGNOSIS — K2970 Gastritis, unspecified, without bleeding: Secondary | ICD-10-CM | POA: Diagnosis not present

## 2020-01-12 DIAGNOSIS — K317 Polyp of stomach and duodenum: Secondary | ICD-10-CM | POA: Diagnosis not present

## 2020-01-12 DIAGNOSIS — K635 Polyp of colon: Secondary | ICD-10-CM

## 2020-01-12 DIAGNOSIS — I4819 Other persistent atrial fibrillation: Secondary | ICD-10-CM | POA: Insufficient documentation

## 2020-01-12 HISTORY — PX: ESOPHAGOGASTRODUODENOSCOPY (EGD) WITH PROPOFOL: SHX5813

## 2020-01-12 HISTORY — PX: POLYPECTOMY: SHX5525

## 2020-01-12 HISTORY — PX: BIOPSY: SHX5522

## 2020-01-12 HISTORY — PX: COLONOSCOPY WITH PROPOFOL: SHX5780

## 2020-01-12 HISTORY — DX: Motion sickness, initial encounter: T75.3XXA

## 2020-01-12 LAB — GLUCOSE, CAPILLARY
Glucose-Capillary: 113 mg/dL — ABNORMAL HIGH (ref 70–99)
Glucose-Capillary: 111 mg/dL — ABNORMAL HIGH (ref 70–99)

## 2020-01-12 SURGERY — ESOPHAGOGASTRODUODENOSCOPY (EGD) WITH PROPOFOL
Anesthesia: General | Site: Throat

## 2020-01-12 MED ORDER — MEPERIDINE HCL 25 MG/ML IJ SOLN: 6.2500 mg | INTRAMUSCULAR | Status: DC | PRN

## 2020-01-12 MED ORDER — PROMETHAZINE HCL 25 MG/ML IJ SOLN: 6.2500 mg | INTRAMUSCULAR | Status: DC | PRN

## 2020-01-12 MED ORDER — LIDOCAINE HCL (CARDIAC) PF 100 MG/5ML IV SOSY
PREFILLED_SYRINGE | INTRAVENOUS | Status: DC | PRN
  Administered 2020-01-12: 40 mg via INTRAVENOUS

## 2020-01-12 MED ORDER — FENTANYL CITRATE (PF) 100 MCG/2ML IJ SOLN: 25.0000 ug | INTRAMUSCULAR | Status: DC | PRN

## 2020-01-12 MED ORDER — STERILE WATER FOR IRRIGATION IR SOLN
Status: DC | PRN
  Administered 2020-01-12: 100 mL

## 2020-01-12 MED ORDER — GLYCOPYRROLATE 0.2 MG/ML IJ SOLN
INTRAMUSCULAR | Status: DC | PRN
  Administered 2020-01-12: .1 mg via INTRAVENOUS

## 2020-01-12 MED ORDER — PROPOFOL 10 MG/ML IV BOLUS
INTRAVENOUS | Status: DC | PRN
  Administered 2020-01-12: 30 mg via INTRAVENOUS
  Administered 2020-01-12: 20 mg via INTRAVENOUS
  Administered 2020-01-12: 30 mg via INTRAVENOUS
  Administered 2020-01-12: 20 mg via INTRAVENOUS
  Administered 2020-01-12: 30 mg via INTRAVENOUS
  Administered 2020-01-12: 20 mg via INTRAVENOUS
  Administered 2020-01-12: 30 mg via INTRAVENOUS
  Administered 2020-01-12: 20 mg via INTRAVENOUS
  Administered 2020-01-12: 140 mg via INTRAVENOUS
  Administered 2020-01-12 (×2): 20 mg via INTRAVENOUS
  Administered 2020-01-12: 30 mg via INTRAVENOUS
  Administered 2020-01-12: 40 mg via INTRAVENOUS
  Administered 2020-01-12 (×2): 30 mg via INTRAVENOUS
  Administered 2020-01-12: 20 mg via INTRAVENOUS
  Administered 2020-01-12: 40 mg via INTRAVENOUS
  Administered 2020-01-12 (×2): 20 mg via INTRAVENOUS
  Administered 2020-01-12: 40 mg via INTRAVENOUS
  Administered 2020-01-12 (×2): 20 mg via INTRAVENOUS
  Administered 2020-01-12 (×2): 30 mg via INTRAVENOUS
  Administered 2020-01-12: 20 mg via INTRAVENOUS
  Administered 2020-01-12: 40 mg via INTRAVENOUS
  Administered 2020-01-12: 30 mg via INTRAVENOUS
  Administered 2020-01-12 (×3): 20 mg via INTRAVENOUS
  Administered 2020-01-12: 30 mg via INTRAVENOUS
  Administered 2020-01-12: 40 mg via INTRAVENOUS
  Administered 2020-01-12: 30 mg via INTRAVENOUS
  Administered 2020-01-12: 20 mg via INTRAVENOUS

## 2020-01-12 MED ORDER — OXYCODONE HCL 5 MG/5ML PO SOLN: 5.0000 mg | Freq: Once | ORAL | Status: DC | PRN

## 2020-01-12 MED ORDER — LACTATED RINGERS IV SOLN: INTRAVENOUS | Status: DC

## 2020-01-12 MED ORDER — OXYCODONE HCL 5 MG PO TABS: 5.0000 mg | ORAL_TABLET | Freq: Once | ORAL | Status: DC | PRN

## 2020-01-12 SURGICAL SUPPLY — 17 items
BLOCK BITE 60FR ADLT L/F GRN (MISCELLANEOUS) ×4 IMPLANT
ELECT REM PT RETURN 9FT ADLT (ELECTROSURGICAL) ×4
ELECTRODE REM PT RTRN 9FT ADLT (ELECTROSURGICAL) ×3 IMPLANT
FORCEPS BIOP RAD 4 LRG CAP 4 (CUTTING FORCEPS) ×4 IMPLANT
GOWN CVR UNV OPN BCK APRN NK (MISCELLANEOUS) ×6 IMPLANT
GOWN ISOL THUMB LOOP REG UNIV (MISCELLANEOUS) ×8
KIT ENDO PROCEDURE OLY (KITS) ×8 IMPLANT
MANIFOLD NEPTUNE II (INSTRUMENTS) ×4 IMPLANT
MARKER SPOT ENDO TATTOO 5ML (MISCELLANEOUS) ×3 IMPLANT
NEEDLE CARR LOCKE SCLERO (NEEDLE) ×4 IMPLANT
RETRIEVER NET ROTH 2.5X230 LF (MISCELLANEOUS) ×4 IMPLANT
SNARE COLD EXACTO (MISCELLANEOUS) ×4 IMPLANT
SNARE LASSO HEX 3 IN 1 (INSTRUMENTS) ×4 IMPLANT
SPONGE GAUZE 4X4 16PLY NS LF (MISCELLANEOUS) ×8 IMPLANT
SPOT EX ENDOSCOPIC TATTOO (MISCELLANEOUS) ×1
TRAP ETRAP POLY (MISCELLANEOUS) ×4 IMPLANT
WATER STERILE IRR 250ML POUR (IV SOLUTION) ×4 IMPLANT

## 2020-01-12 NOTE — Anesthesia Preprocedure Evaluation (Signed)
Anesthesia Evaluation  Patient identified by MRN, date of birth, ID band Patient awake    Reviewed: Allergy & Precautions, H&P , NPO status , Patient's Chart, lab work & pertinent test results, reviewed documented beta blocker date and time   Airway Mallampati: II  TM Distance: >3 FB Neck ROM: full    Dental no notable dental hx.    Pulmonary sleep apnea ,    Pulmonary exam normal breath sounds clear to auscultation       Cardiovascular Exercise Tolerance: Good +CHF   Rhythm:regular Rate:Normal     Neuro/Psych negative neurological ROS  negative psych ROS   GI/Hepatic negative GI ROS, Neg liver ROS,   Endo/Other  negative endocrine ROSdiabetes  Renal/GU negative Renal ROS  negative genitourinary   Musculoskeletal   Abdominal   Peds  Hematology negative hematology ROS (+)   Anesthesia Other Findings   Reproductive/Obstetrics negative OB ROS                             Anesthesia Physical Anesthesia Plan  ASA: II  Anesthesia Plan: General   Post-op Pain Management:    Induction:   PONV Risk Score and Plan: 3 and Propofol infusion and TIVA  Airway Management Planned:   Additional Equipment:   Intra-op Plan:   Post-operative Plan:   Informed Consent: I have reviewed the patients History and Physical, chart, labs and discussed the procedure including the risks, benefits and alternatives for the proposed anesthesia with the patient or authorized representative who has indicated his/her understanding and acceptance.     Dental Advisory Given  Plan Discussed with: CRNA  Anesthesia Plan Comments:         Anesthesia Quick Evaluation

## 2020-01-12 NOTE — Op Note (Signed)
[facility] Gastroenterology Patient Name: Brandy [name] Procedure Date: 01/12/2020 11:17 AM MRN: [id number] Account #: [id number] Date of Birth: [birth year] Admit Type: Outpatient Age: 64 Room: [facility] OR ROOM 01 Gender: Female Note Status: Finalized Procedure:             Colonoscopy Indications:           Screening for colorectal malignant neoplasm Providers:             Blake Goya B. Bonna Gains MD, MD Medicines:             Monitored Anesthesia Care Complications:         No immediate complications. Procedure:             Pre-Anesthesia Assessment:                        - ASA Grade Assessment: II - A patient with mild                         systemic disease.                        - Prior to the procedure, a History and Physical was                         performed, and patient medications, allergies and                         sensitivities were reviewed. The patient's tolerance                         of previous anesthesia was reviewed.                        - The risks and benefits of the procedure and the                         sedation options and risks were discussed with the                         patient. All questions were answered and informed                         consent was obtained.                        - Patient identification and proposed procedure were                         verified prior to the procedure by the physician, the                         nurse, the anesthesiologist, the anesthetist and the                         technician. The procedure was verified in the                         procedure room.  After obtaining informed consent, the colonoscope was                         passed under direct vision. Throughout the procedure,                         the patient's blood pressure, pulse, and oxygen                         saturations were monitored continuously. The was                         introduced  through the anus and advanced to the the                         cecum, identified by appendiceal orifice and ileocecal                         valve. The colonoscopy was performed with ease. The                         patient tolerated the procedure well. The quality of                         the bowel preparation was good. Findings:      The perianal and digital rectal examinations were normal.      A 8 mm polyp was found in the ileocecal valve. The polyp was sessile.       The polyp was removed with a cold snare. Resection and retrieval were       complete.      A 12 mm polyp was found in the ascending colon. The polyp was sessile.       The polyp was removed with a hot snare. Resection was complete, but the       polyp tissue was only partially retrieved. It was retrieved via roth net       but part of the polyp may have fallen off during retrieval      A large polyp was found in the ascending colon. The polyp was       multi-lobulated. Biopsies were taken with a cold forceps for histology.       Area was tattooed with an injection of Spot (carbon black). The biopsy       was taken from the top of the polyp and NOT at the base or edges to       prevent scarring from intervention.      A 15 mm polyp was found in the ascending colon. The polyp was flat. The       polyp was removed with a piecemeal technique using a cold snare.       Resection and retrieval were complete. Area was tattooed with an       injection of Spot (carbon black).      Two semi-pedunculated polyps were found in the sigmoid colon. The polyps       were 9 to 12 mm in size. These polyps were removed with a cold snare.       Resection and retrieval were complete.      Multiple diverticula were found in the sigmoid colon.  The exam was otherwise without abnormality.      The rectum, sigmoid colon, descending colon, transverse colon, ascending       colon and cecum appeared normal.      The retroflexed view of  the distal rectum and anal verge was normal and       showed no anal or rectal abnormalities. Impression:            - One 8 mm polyp at the ileocecal valve, removed with                         a cold snare. Resected and retrieved.                        - One 12 mm polyp in the ascending colon, removed with                         a hot snare. Complete resection. Partial retrieval.                        - One large polyp in the ascending colon. Biopsied.                         Tattooed.                        - One 15 mm polyp in the ascending colon, removed                         piecemeal using a cold snare. Resected and retrieved.                         Tattooed.                        - Two 9 to 12 mm polyps in the sigmoid colon, removed                         with a cold snare. Resected and retrieved.                        - Diverticulosis in the sigmoid colon.                        - The examination was otherwise normal.                        - The rectum, sigmoid colon, descending colon,                         transverse colon, ascending colon and cecum are normal.                        - The distal rectum and anal verge are normal on                         retroflexion view. Recommendation:        - Discharge patient to home (with escort).                        -  Advance diet as tolerated.                        - Continue present medications.                        - Await pathology results.                        - Repeat colonoscopy date to be determined after                         pending pathology results are reviewed.                        - The findings and recommendations were discussed with                         the patient.                        - The findings and recommendations were discussed with                         the patient's family.                        - Return to primary care physician as previously                          scheduled.                        - High fiber diet.                        - Pt will need referral to tertiary care center for                         polyp removal vs surgical intervention                        - Return to my office in 2 weeks. Procedure Code(s):     --- Professional ---                        [phone], Colonoscopy, flexible; with removal of                         tumor(s), polyp(s), or other lesion(s) by snare                         technique                        45381, Colonoscopy, flexible; with directed submucosal                         injection(s), any substance                        40086, 59, Colonoscopy, flexible; with biopsy, single  or multiple Diagnosis Code(s):     --- Professional ---                        K63.5, Polyp of colon                        Z12.11, Encounter for screening for malignant neoplasm                         of colon CPT copyright 2019 American Medical Association. All rights reserved. The codes documented in this report are preliminary and upon coder review may  be revised to meet current compliance requirements.  Vonda Antigua, MD Margretta Sidle B. Bonna Gains MD, MD 01/12/2020 12:59:26 PM This report has been signed electronically. Number of Addenda: 0 Note Initiated On: 01/12/2020 11:17 AM Scope Withdrawal Time: 0 hours 51 minutes 8 seconds  Total Procedure Duration: 0 hours 56 minutes 15 seconds  Estimated Blood Loss:  Estimated blood loss: none.      [facility]

## 2020-01-12 NOTE — Transfer of Care (Signed)
Immediate Anesthesia Transfer of Care Note  Patient: Brandy [name]  Procedure(s) Performed: ESOPHAGOGASTRODUODENOSCOPY (EGD) WITH PROPOFOL (N/A Throat) COLONOSCOPY WITH PROPOFOL (N/A ) BIOPSY (N/A Throat) POLYPECTOMY (N/A Rectum)  Patient Location: PACU  Anesthesia Type: General  Level of Consciousness: awake, alert  and patient cooperative  Airway and Oxygen Therapy: Patient Spontanous Breathing and Patient connected to supplemental oxygen  Post-op Assessment: Post-op Vital signs reviewed, Patient's Cardiovascular Status Stable, Respiratory Function Stable, Patent Airway and No signs of Nausea or vomiting  Post-op Vital Signs: Reviewed and stable  Complications: No complications documented.

## 2020-01-12 NOTE — H&P (Signed)
Vonda Antigua, MD [address] [place], [place], [place], [address] [address], [place], [place], [place], [address] Phone: [phone]  Fax: [phone]  Primary Care Physician:  Glean Hess, MD   Pre-Procedure History & Physical: HPI:  Brandy [name] is a 64 y.o. female is here for a colonoscopy and EGD.   Past Medical History:  Diagnosis Date  . Chicken pox   . Chronic systolic CHF (congestive heart failure) ([place])    a. 03/2016 Echo: Ef 15-20%, diff HK, ant AK;  b. 05/2016 Echo: Ef 30-35%, diff HK, mildly dil LA/RA.  . Diabetes mellitus without complication ([place])   . Hip discomfort    been going on for 40 years   . History of kidney stones   . Moderate mitral regurgitation    a. 03/2016 Echo: mod MR in setting of LV dysfxn.  . Motion sickness    car - back seat  . NICM (nonischemic cardiomyopathy) ([place])    a. 03/2016 Echo: EF 15-20%, diff HK, ant AK, mod MR, mod dil LA, mildly dil RA;  b. 04/2016 Cath: nl cors;  c. 05/2016 Echo: EF 30-35%, diff HK.  . Obesity   . Obstructive sleep apnea    compliant with CPAP  . Persistent atrial fibrillation ([place])    a. Dx 03/2016;  b. CHA2DS2VASc = 2-->Eliquis 13m BID;  b. 05/2016 Failed DCCV x 4.  . Sleep apnea   . Visit for monitoring Tikosyn therapy 07/24/2016    Past Surgical History:  Procedure Laterality Date  . CARDIAC CATHETERIZATION N/A 04/17/2016   Procedure: Left Heart Cath and Coronary Angiography;  Surgeon: MWellington Hampshire MD;  [facility];  Service: Cardiovascular;  Laterality: N/A;  . CHOLECYSTECTOMY    . ELECTROPHYSIOLOGIC STUDY N/A 05/26/2016   Procedure: Cardioversion;  Surgeon: MWellington Hampshire MD;  Location: [facility];  Service: Cardiovascular;  Laterality: N/A;    Prior to Admission medications   Medication Sig Start Date End Date Taking? Authorizing Provider  apixaban (ELIQUIS) 5 MG TABS tablet Take 1 tablet (5 mg total) by mouth 2 (two) times daily. 12/16/19  Yes KDeboraha Sprang  MD  Cholecalciferol (VITAMIN D3) 1000 units CAPS Take 1 capsule by mouth 2 (two) times daily.    Yes [provider]  CINNAMON PO Take 2 capsules by mouth daily.    Yes [provider]  CRANBERRY PO Take 1 capsule by mouth daily as needed. Soft gel    Yes [provider]  digoxin (LANOXIN) 0.25 MG tablet Take 1 tablet (0.25 mg total) by mouth daily. 12/24/19  Yes KDeboraha Sprang MD  furosemide (LASIX) 20 MG tablet Take 20 mg by mouth daily as needed for fluid or edema.   Yes [provider]  Lavender Oil OIL Apply 1 application topically daily as needed.    Yes [provider]  LLovell SheehanOIL Take 1 application by mouth daily as needed.    Yes [provider]  Lemongrass Oil OIL Apply 1 application topically daily as needed.    Yes [provider]  magnesium gluconate (MAGONATE) 500 MG tablet Take 200 mg by mouth daily.    Yes [provider]  MELALEUCA [place] Apply 1 application topically daily as needed. Oil    Yes [provider]  metFORMIN (GLUCOPHAGE-XR) 500 MG 24 hr tablet Take 1 tablet (500 mg total) by mouth 2 (two) times daily before a meal. 10/24/19  Yes BGlean Hess  MD  metoprolol succinate (TOPROL-XL) 100 MG 24 hr tablet Take 1.5 tablets (150 mg) by mouth once daily. Take with or immediately following a meal. 04/25/19  Yes Deboraha Sprang, MD  Multiple Vitamins-Minerals (MULTIVITAMIN WITH MINERALS) tablet Take 1 tablet by mouth daily.   Yes [provider]  OIL OF OREGANO PO Take 1 application by mouth daily. To left big toe   Yes [provider]  peppermint oil liquid Apply 1 application topically daily as needed.    Yes [provider]  vitamin C (ASCORBIC ACID) 500 MG tablet Take 500 mg by mouth daily.    Yes [provider]  zinc gluconate 50 MG tablet Take 50 mg by mouth daily.   Yes [provider]  blood glucose meter kit and supplies KIT Dispense based on  patient and insurance preference. Use 1-2 times per day to test [facility] 10/24/19   Glean Hess, MD  polyethylene glycol-electrolytes (NULYTELY) 420 g solution Prepare according to package instructions. Starting at 5:00 PM: Drink one 8 oz glass of mixture every 15 minutes until you finish half of the jug. Five hours prior to procedure, drink 8 oz glass of mixture every 15 minutes until it is all gone. Make sure you do not drink anything 4 hours prior to your procedure. 12/15/19   Virgel Manifold, MD    Allergies as of 12/15/2019  . (No Known Allergies)    Family History  Problem Relation Age of Onset  . COPD Mother   . COPD Father   . Heart disease Father   . Hypertension Father   . Diabetes Paternal Aunt     Social History   Socioeconomic History  . Marital status: Widowed    Spouse name: Deceased  . Number of children: 1  . Years of education: Not on file  . Highest education level: Not on file  Occupational History  . Not on file  Tobacco Use  . Smoking status: Never Smoker  . Smokeless tobacco: Never Used  Vaping Use  . Vaping Use: Never used  Substance and Sexual Activity  . Alcohol use: Not Currently  . Drug use: No  . Sexual activity: Not Currently    Partners: Male  Other Topics Concern  . Not on file  Social History Narrative   1 by birth 2 adopted   Social Determinants of Health   Financial Resource Strain:   . Difficulty of Paying Living Expenses: Not on file  Food Insecurity:   . Worried About Charity fundraiser in the Last Year: Not on file  . Ran Out of Food in the Last Year: Not on file  Transportation Needs:   . Lack of Transportation (Medical): Not on file  . Lack of Transportation (Non-Medical): Not on file  Physical Activity:   . Days of Exercise per Week: Not on file  . Minutes of Exercise per Session: Not on file  Stress:   . Feeling of Stress : Not on file  Social Connections:   . Frequency of Communication with Friends and Family:  Not on file  . Frequency of Social Gatherings with Friends and Family: Not on file  . Attends Religious Services: Not on file  . Active Member of Clubs or Organizations: Not on file  . Attends Archivist Meetings: Not on file  . Marital Status: Not on file  Intimate Partner Violence:   . Fear of Current or Ex-Partner: Not on file  . Emotionally  Abused: Not on file  . Physically Abused: Not on file  . Sexually Abused: Not on file    Review of Systems: See HPI, otherwise negative ROS  Physical Exam: BP 131/84   Pulse 87   Temp (!) 97.3 F (36.3 C) (Temporal)   Resp 18   Ht _0  (1.702 m)   Wt 92.1 kg   SpO2 97%   BMI 31.79 kg/m  General:   Alert,  pleasant and cooperative in NAD Head:  Normocephalic and atraumatic. Neck:  Supple; no masses or thyromegaly. Lungs:  Clear throughout to auscultation, normal respiratory effort.    Heart:  +S1, +S2, Regular rate and rhythm, No edema. Abdomen:  Soft, nontender and nondistended. Normal bowel sounds, without guarding, and without rebound.   Neurologic:  Alert and  oriented x4;  grossly normal neurologically.  Impression/Plan: Brandy [name] is here for a colonoscopy to be performed for average risk screening and EGD for dysphagia  Risks, benefits, limitations, and alternatives regarding the procedures have been reviewed with the patient.  Questions have been answered.  All parties agreeable.   Virgel Manifold, MD  01/12/2020, 11:25 AM

## 2020-01-12 NOTE — Anesthesia Procedure Notes (Signed)
Date/Time: 01/12/2020 11:35 AM Performed by: Cameron Ali, CRNA Pre-anesthesia Checklist: Patient identified, Emergency Drugs available, Suction available, Timeout performed and Patient being monitored Patient Re-evaluated:Patient Re-evaluated prior to induction Oxygen Delivery Method: Nasal cannula Placement Confirmation: positive ETCO2

## 2020-01-12 NOTE — Anesthesia Postprocedure Evaluation (Signed)
Anesthesia Post Note  Patient: Brandy [name]  Procedure(s) Performed: ESOPHAGOGASTRODUODENOSCOPY (EGD) WITH PROPOFOL (N/A Throat) COLONOSCOPY WITH PROPOFOL (N/A ) BIOPSY (N/A Throat) POLYPECTOMY (N/A Rectum)     Patient location during evaluation: PACU Anesthesia Type: General Level of consciousness: awake and alert Pain management: pain level controlled Vital Signs Assessment: post-procedure vital signs reviewed and stable Respiratory status: spontaneous breathing, nonlabored ventilation, respiratory function stable and patient connected to nasal cannula oxygen Cardiovascular status: blood pressure returned to baseline and stable Postop Assessment: no apparent nausea or vomiting Anesthetic complications: no   No complications documented.  Rody Keadle, [place]

## 2020-01-12 NOTE — Op Note (Signed)
[facility] Gastroenterology Patient Name: Brandy [name] Procedure Date: 01/12/2020 11:20 AM MRN: [id number] Account #: [id number] Date of Birth: 05/24/1955 Admit Type: Outpatient Age: 64 Room: [facility] OR ROOM 01 Gender: Female Note Status: Finalized Procedure:             Upper GI endoscopy Indications:           Dysphagia Providers:             Makynli Stills B. Bonna Gains MD, MD Referring MD:          Halina Maidens, MD (Referring MD) Medicines:             Monitored Anesthesia Care Complications:         No immediate complications. Procedure:             Pre-Anesthesia Assessment:                        - Prior to the procedure, a History and Physical was                         performed, and patient medications, allergies and                         sensitivities were reviewed. The patient's tolerance                         of previous anesthesia was reviewed.                        - The risks and benefits of the procedure and the                         sedation options and risks were discussed with the                         patient. All questions were answered and informed                         consent was obtained.                        - Patient identification and proposed procedure were                         verified prior to the procedure by the physician, the                         nurse, the anesthesiologist, the anesthetist and the                         technician. The procedure was verified in the                         procedure room.                        - ASA Grade Assessment: II - A patient with mild                         systemic disease.  After obtaining informed consent, the endoscope was                         passed under direct vision. Throughout the procedure,                         the patient's blood pressure, pulse, and oxygen                         saturations were monitored continuously. The was                          introduced through the mouth, and advanced to the                         second part of duodenum. The upper GI endoscopy was                         accomplished with ease. The patient tolerated the                         procedure well. Findings:      The examined esophagus was normal. Biopsies were obtained from the       proximal and distal esophagus with cold forceps for histology of       suspected eosinophilic esophagitis.      Patchy mildly erythematous mucosa without bleeding was found in the       gastric antrum. Biopsies were taken with a cold forceps for histology.       Biopsies were obtained in the gastric body, at the incisura and in the       gastric antrum with cold forceps for histology.      A few 3 to 4 mm sessile polyps with no bleeding and no stigmata of       recent bleeding were found in the gastric body. Biopsies were taken with       a cold forceps for histology.      The exam of the stomach was otherwise normal.      The duodenal bulb, second portion of the duodenum and examined duodenum       were normal. Impression:            - Normal esophagus. Biopsied.                        - Erythematous mucosa in the antrum. Biopsied.                        - A few gastric polyps. Biopsied.                        - Normal duodenal bulb, second portion of the duodenum                         and examined duodenum.                        - Biopsies were obtained in the gastric body, at the  incisura and in the gastric antrum. Recommendation:        - Await pathology results.                        - Discharge patient to home (with escort).                        - Advance diet as tolerated.                        - Continue present medications.                        - Patient has a contact number available for                         emergencies. The signs and symptoms of potential                         delayed complications  were discussed with the patient.                         Return to normal activities tomorrow. Written                         discharge instructions were provided to the patient.                        - Discharge patient to home (with escort).                        - The findings and recommendations were discussed with                         the patient.                        - The findings and recommendations were discussed with                         the patient's family. Procedure Code(s):     --- Professional ---                        [phone], Esophagogastroduodenoscopy, flexible,                         transoral; with biopsy, single or multiple Diagnosis Code(s):     --- Professional ---                        K31.89, Other diseases of stomach and duodenum                        K31.7, Polyp of stomach and duodenum                        R13.10, Dysphagia, unspecified CPT copyright 2019 American Medical Association. All rights reserved. The codes documented in this report are preliminary and upon coder review may  be revised to meet current compliance requirements.  Vonda Antigua, MD Margretta Sidle B. Iziah Cates MD,  MD 01/12/2020 11:51:38 AM This report has been signed electronically. Number of Addenda: 0 Note Initiated On: 01/12/2020 11:20 AM Total Procedure Duration: 0 hours 7 minutes 0 seconds  Estimated Blood Loss:  Estimated blood loss: none.      [facility]

## 2020-01-13 ENCOUNTER — Encounter: Payer: Self-pay | Admitting: Gastroenterology

## 2020-01-14 DIAGNOSIS — E119 Type 2 diabetes mellitus without complications: Secondary | ICD-10-CM | POA: Diagnosis not present

## 2020-01-14 LAB — SURGICAL PATHOLOGY

## 2020-01-15 ENCOUNTER — Telehealth: Payer: Self-pay

## 2020-01-15 NOTE — Telephone Encounter (Signed)
Called patient to let her know about her large ascending colon polyp needing to be removed at [facility]. I told her that I would be faxing the order form and that they will be contacting her to schedule it. Patient agreed.

## 2020-01-15 NOTE — Telephone Encounter (Signed)
-----   Message from Virgel Manifold, MD sent at 01/14/2020  3:18 PM EDT ----- Herb Grays please let the patient know, one of the polyps in her colon was particularly large and was not able to be removed. She will need to be referred to [facility] for removal of this polyp. Please refer to [facility] for large ascending colon polyp.

## 2020-01-16 ENCOUNTER — Ambulatory Visit: Payer: Medicare Other | Admitting: Internal Medicine

## 2020-01-18 ENCOUNTER — Other Ambulatory Visit: Payer: Self-pay

## 2020-01-18 ENCOUNTER — Encounter: Payer: Self-pay | Admitting: Gastroenterology

## 2020-01-18 DIAGNOSIS — D649 Anemia, unspecified: Secondary | ICD-10-CM | POA: Diagnosis present

## 2020-01-18 DIAGNOSIS — K91840 Postprocedural hemorrhage and hematoma of a digestive system organ or structure following a digestive system procedure: Principal | ICD-10-CM | POA: Diagnosis present

## 2020-01-18 DIAGNOSIS — I4821 Permanent atrial fibrillation: Secondary | ICD-10-CM | POA: Diagnosis not present

## 2020-01-18 DIAGNOSIS — I428 Other cardiomyopathies: Secondary | ICD-10-CM | POA: Diagnosis present

## 2020-01-18 DIAGNOSIS — I959 Hypotension, unspecified: Secondary | ICD-10-CM | POA: Diagnosis present

## 2020-01-18 DIAGNOSIS — I5042 Chronic combined systolic (congestive) and diastolic (congestive) heart failure: Secondary | ICD-10-CM | POA: Diagnosis not present

## 2020-01-18 DIAGNOSIS — Z9889 Other specified postprocedural states: Secondary | ICD-10-CM | POA: Diagnosis not present

## 2020-01-18 DIAGNOSIS — Z79899 Other long term (current) drug therapy: Secondary | ICD-10-CM | POA: Diagnosis not present

## 2020-01-18 DIAGNOSIS — Z7984 Long term (current) use of oral hypoglycemic drugs: Secondary | ICD-10-CM

## 2020-01-18 DIAGNOSIS — F419 Anxiety disorder, unspecified: Secondary | ICD-10-CM | POA: Diagnosis present

## 2020-01-18 DIAGNOSIS — G4733 Obstructive sleep apnea (adult) (pediatric): Secondary | ICD-10-CM | POA: Diagnosis not present

## 2020-01-18 DIAGNOSIS — K635 Polyp of colon: Secondary | ICD-10-CM | POA: Diagnosis present

## 2020-01-18 DIAGNOSIS — K625 Hemorrhage of anus and rectum: Secondary | ICD-10-CM | POA: Diagnosis not present

## 2020-01-18 DIAGNOSIS — Y838 Other surgical procedures as the cause of abnormal reaction of the patient, or of later complication, without mention of misadventure at the time of the procedure: Secondary | ICD-10-CM | POA: Diagnosis present

## 2020-01-18 DIAGNOSIS — K921 Melena: Secondary | ICD-10-CM | POA: Diagnosis present

## 2020-01-18 DIAGNOSIS — Z825 Family history of asthma and other chronic lower respiratory diseases: Secondary | ICD-10-CM

## 2020-01-18 DIAGNOSIS — K219 Gastro-esophageal reflux disease without esophagitis: Secondary | ICD-10-CM | POA: Diagnosis present

## 2020-01-18 DIAGNOSIS — I5022 Chronic systolic (congestive) heart failure: Secondary | ICD-10-CM | POA: Diagnosis not present

## 2020-01-18 DIAGNOSIS — Z9049 Acquired absence of other specified parts of digestive tract: Secondary | ICD-10-CM | POA: Diagnosis not present

## 2020-01-18 DIAGNOSIS — I950 Idiopathic hypotension: Secondary | ICD-10-CM | POA: Diagnosis not present

## 2020-01-18 DIAGNOSIS — I110 Hypertensive heart disease with heart failure: Secondary | ICD-10-CM | POA: Diagnosis present

## 2020-01-18 DIAGNOSIS — E669 Obesity, unspecified: Secondary | ICD-10-CM | POA: Diagnosis not present

## 2020-01-18 DIAGNOSIS — Z833 Family history of diabetes mellitus: Secondary | ICD-10-CM

## 2020-01-18 DIAGNOSIS — Z7901 Long term (current) use of anticoagulants: Secondary | ICD-10-CM | POA: Diagnosis not present

## 2020-01-18 DIAGNOSIS — Z87442 Personal history of urinary calculi: Secondary | ICD-10-CM | POA: Diagnosis not present

## 2020-01-18 DIAGNOSIS — K922 Gastrointestinal hemorrhage, unspecified: Secondary | ICD-10-CM | POA: Diagnosis not present

## 2020-01-18 DIAGNOSIS — E1169 Type 2 diabetes mellitus with other specified complication: Secondary | ICD-10-CM | POA: Diagnosis not present

## 2020-01-18 DIAGNOSIS — Z8249 Family history of ischemic heart disease and other diseases of the circulatory system: Secondary | ICD-10-CM | POA: Diagnosis not present

## 2020-01-18 DIAGNOSIS — I4891 Unspecified atrial fibrillation: Secondary | ICD-10-CM | POA: Diagnosis not present

## 2020-01-18 DIAGNOSIS — E119 Type 2 diabetes mellitus without complications: Secondary | ICD-10-CM | POA: Diagnosis present

## 2020-01-18 DIAGNOSIS — E876 Hypokalemia: Secondary | ICD-10-CM | POA: Diagnosis not present

## 2020-01-18 DIAGNOSIS — Z20822 Contact with and (suspected) exposure to covid-19: Secondary | ICD-10-CM | POA: Diagnosis not present

## 2020-01-18 DIAGNOSIS — M47816 Spondylosis without myelopathy or radiculopathy, lumbar region: Secondary | ICD-10-CM | POA: Diagnosis not present

## 2020-01-18 LAB — TYPE AND SCREEN
ABO/RH(D): O POS
Antibody Screen: NEGATIVE

## 2020-01-18 LAB — COMPREHENSIVE METABOLIC PANEL
ALT: 28 U/L (ref 0–44)
AST: 27 U/L (ref 15–41)
Albumin: 4.3 g/dL (ref 3.5–5.0)
Alkaline Phosphatase: 72 U/L (ref 38–126)
Anion gap: 10 (ref 5–15)
BUN: 19 mg/dL (ref 8–23)
CO2: 23 mmol/L (ref 22–32)
Calcium: 9.7 mg/dL (ref 8.9–10.3)
Chloride: 108 mmol/L (ref 98–111)
Creatinine, Ser: 0.84 mg/dL (ref 0.44–1.00)
GFR calc Af Amer: >60 mL/min (ref >60)
GFR calc non Af Amer: >60 mL/min (ref >60)
Glucose, Bld: 170 mg/dL — ABNORMAL HIGH (ref 70–99)
Potassium: 4 mmol/L (ref 3.5–5.1)
Sodium: 141 mmol/L (ref 135–145)
Total Bilirubin: 0.7 mg/dL (ref 0.3–1.2)
Total Protein: 8 g/dL (ref 6.5–8.1)

## 2020-01-18 LAB — CBC
HCT: 41.4 % (ref 36.0–46.0)
Hemoglobin: 14.3 g/dL (ref 12.0–15.0)
MCH: 31.4 pg (ref 26.0–34.0)
MCHC: 34.5 g/dL (ref 30.0–36.0)
MCV: 90.8 fL (ref 80.0–100.0)
Platelets: 284 10*3/uL (ref 150–400)
RBC: 4.56 MIL/uL (ref 3.87–5.11)
RDW: 12.4 % (ref 11.5–15.5)
WBC: 9.9 10*3/uL (ref 4.0–10.5)
nRBC: 0 % (ref 0.0–0.2)

## 2020-01-18 NOTE — ED Triage Notes (Signed)
Patient reports colonoscopy on Monday. Patient reports heavy rectal bleeding beginning at 1400 today. Patient c/o mild lower abdominal pain.

## 2020-01-18 NOTE — Progress Notes (Signed)
Received page from operator regarding this patient who had a colonoscopy on Monday by Dr Bonna Gains, several large polyps were removed, eliquis was resumed on Monday evening  Patient felt stomach cramps today evening, followed by 2 episodes of large bloody BMs. She also took eliquis today. Advices her to go to ER right away and she agreed to do so  Sherri Sear, MD

## 2020-01-19 ENCOUNTER — Emergency Department: Payer: Medicare Other

## 2020-01-19 ENCOUNTER — Encounter: Payer: Self-pay | Admitting: Radiology

## 2020-01-19 ENCOUNTER — Inpatient Hospital Stay
Admission: EM | Admit: 2020-01-19 | Discharge: 2020-01-24 | DRG: 920 | Disposition: A | Discharge status: Home / Self Care | Payer: Medicare Other | Attending: Internal Medicine | Admitting: Internal Medicine

## 2020-01-19 DIAGNOSIS — I959 Hypotension, unspecified: Secondary | ICD-10-CM

## 2020-01-19 DIAGNOSIS — Z9049 Acquired absence of other specified parts of digestive tract: Secondary | ICD-10-CM | POA: Diagnosis not present

## 2020-01-19 DIAGNOSIS — I5042 Chronic combined systolic (congestive) and diastolic (congestive) heart failure: Secondary | ICD-10-CM | POA: Diagnosis present

## 2020-01-19 DIAGNOSIS — K922 Gastrointestinal hemorrhage, unspecified: Secondary | ICD-10-CM

## 2020-01-19 DIAGNOSIS — I428 Other cardiomyopathies: Secondary | ICD-10-CM | POA: Diagnosis present

## 2020-01-19 DIAGNOSIS — I482 Chronic atrial fibrillation, unspecified: Secondary | ICD-10-CM | POA: Insufficient documentation

## 2020-01-19 DIAGNOSIS — E119 Type 2 diabetes mellitus without complications: Secondary | ICD-10-CM | POA: Diagnosis present

## 2020-01-19 DIAGNOSIS — D649 Anemia, unspecified: Secondary | ICD-10-CM | POA: Diagnosis present

## 2020-01-19 DIAGNOSIS — Z79899 Other long term (current) drug therapy: Secondary | ICD-10-CM | POA: Diagnosis not present

## 2020-01-19 DIAGNOSIS — Z8249 Family history of ischemic heart disease and other diseases of the circulatory system: Secondary | ICD-10-CM | POA: Diagnosis not present

## 2020-01-19 DIAGNOSIS — Z833 Family history of diabetes mellitus: Secondary | ICD-10-CM | POA: Diagnosis not present

## 2020-01-19 DIAGNOSIS — F419 Anxiety disorder, unspecified: Secondary | ICD-10-CM | POA: Diagnosis present

## 2020-01-19 DIAGNOSIS — Z7901 Long term (current) use of anticoagulants: Secondary | ICD-10-CM

## 2020-01-19 DIAGNOSIS — G4733 Obstructive sleep apnea (adult) (pediatric): Secondary | ICD-10-CM | POA: Diagnosis present

## 2020-01-19 DIAGNOSIS — I4891 Unspecified atrial fibrillation: Secondary | ICD-10-CM

## 2020-01-19 DIAGNOSIS — Z825 Family history of asthma and other chronic lower respiratory diseases: Secondary | ICD-10-CM | POA: Diagnosis not present

## 2020-01-19 DIAGNOSIS — K219 Gastro-esophageal reflux disease without esophagitis: Secondary | ICD-10-CM | POA: Diagnosis present

## 2020-01-19 DIAGNOSIS — Z87442 Personal history of urinary calculi: Secondary | ICD-10-CM | POA: Diagnosis not present

## 2020-01-19 DIAGNOSIS — E876 Hypokalemia: Secondary | ICD-10-CM | POA: Diagnosis not present

## 2020-01-19 DIAGNOSIS — K635 Polyp of colon: Secondary | ICD-10-CM | POA: Diagnosis present

## 2020-01-19 DIAGNOSIS — E118 Type 2 diabetes mellitus with unspecified complications: Secondary | ICD-10-CM | POA: Diagnosis present

## 2020-01-19 DIAGNOSIS — E669 Obesity, unspecified: Secondary | ICD-10-CM | POA: Diagnosis present

## 2020-01-19 DIAGNOSIS — Z20822 Contact with and (suspected) exposure to covid-19: Secondary | ICD-10-CM | POA: Diagnosis present

## 2020-01-19 DIAGNOSIS — I110 Hypertensive heart disease with heart failure: Secondary | ICD-10-CM | POA: Diagnosis present

## 2020-01-19 DIAGNOSIS — K625 Hemorrhage of anus and rectum: Secondary | ICD-10-CM | POA: Diagnosis present

## 2020-01-19 DIAGNOSIS — E1169 Type 2 diabetes mellitus with other specified complication: Secondary | ICD-10-CM | POA: Diagnosis present

## 2020-01-19 DIAGNOSIS — Z7984 Long term (current) use of oral hypoglycemic drugs: Secondary | ICD-10-CM | POA: Diagnosis not present

## 2020-01-19 DIAGNOSIS — K91840 Postprocedural hemorrhage and hematoma of a digestive system organ or structure following a digestive system procedure: Secondary | ICD-10-CM | POA: Diagnosis present

## 2020-01-19 DIAGNOSIS — Y838 Other surgical procedures as the cause of abnormal reaction of the patient, or of later complication, without mention of misadventure at the time of the procedure: Secondary | ICD-10-CM | POA: Diagnosis present

## 2020-01-19 DIAGNOSIS — I950 Idiopathic hypotension: Secondary | ICD-10-CM | POA: Diagnosis not present

## 2020-01-19 DIAGNOSIS — K921 Melena: Secondary | ICD-10-CM | POA: Diagnosis present

## 2020-01-19 DIAGNOSIS — Z6833 Body mass index (BMI) 33.0-33.9, adult: Secondary | ICD-10-CM

## 2020-01-19 DIAGNOSIS — I4821 Permanent atrial fibrillation: Secondary | ICD-10-CM | POA: Diagnosis present

## 2020-01-19 HISTORY — DX: Cardiac arrhythmia, unspecified: I49.9

## 2020-01-19 HISTORY — DX: Gastro-esophageal reflux disease without esophagitis: K21.9

## 2020-01-19 HISTORY — DX: Gastrointestinal hemorrhage, unspecified: K92.2

## 2020-01-19 HISTORY — DX: Cardiac murmur, unspecified: R01.1

## 2020-01-19 LAB — HEMOGLOBIN AND HEMATOCRIT, BLOOD
HCT: 37.1 % (ref 36.0–46.0)
HCT: 29.2 % — ABNORMAL LOW (ref 36.0–46.0)
HCT: 29.8 % — ABNORMAL LOW (ref 36.0–46.0)
Hemoglobin: 12.8 g/dL (ref 12.0–15.0)
Hemoglobin: 9.8 g/dL — ABNORMAL LOW (ref 12.0–15.0)
Hemoglobin: 10.3 g/dL — ABNORMAL LOW (ref 12.0–15.0)

## 2020-01-19 LAB — BASIC METABOLIC PANEL
Anion gap: 10 (ref 5–15)
BUN: 14 mg/dL (ref 8–23)
CO2: 20 mmol/L — ABNORMAL LOW (ref 22–32)
Calcium: 8.5 mg/dL — ABNORMAL LOW (ref 8.9–10.3)
Chloride: 107 mmol/L (ref 98–111)
Creatinine, Ser: 0.68 mg/dL (ref 0.44–1.00)
GFR calc Af Amer: >60 mL/min (ref >60)
GFR calc non Af Amer: >60 mL/min (ref >60)
Glucose, Bld: 182 mg/dL — ABNORMAL HIGH (ref 70–99)
Potassium: 3.9 mmol/L (ref 3.5–5.1)
Sodium: 137 mmol/L (ref 135–145)

## 2020-01-19 LAB — HEMOGLOBIN A1C
Hgb A1c MFr Bld: 6.5 % — ABNORMAL HIGH (ref 4.8–5.6)
Mean Plasma Glucose: 139.85 mg/dL

## 2020-01-19 LAB — MAGNESIUM: Magnesium: 2.1 mg/dL (ref 1.7–2.4)

## 2020-01-19 LAB — HIV ANTIBODY (ROUTINE TESTING W REFLEX): HIV Screen 4th Generation wRfx: NONREACTIVE

## 2020-01-19 LAB — SARS CORONAVIRUS 2 BY RT PCR (HOSPITAL ORDER, PERFORMED IN ~~LOC~~ HOSPITAL LAB): SARS Coronavirus 2: NEGATIVE

## 2020-01-19 LAB — GLUCOSE, CAPILLARY
Glucose-Capillary: 149 mg/dL — ABNORMAL HIGH (ref 70–99)
Glucose-Capillary: 189 mg/dL — ABNORMAL HIGH (ref 70–99)

## 2020-01-19 MED ORDER — POLYETHYLENE GLYCOL 3350 17 GM/SCOOP PO POWD
1.0000 | Freq: Once | ORAL | Status: AC
  Administered 2020-01-19: 255 g via ORAL
  Filled 2020-01-19: qty 255

## 2020-01-19 MED ORDER — METFORMIN HCL ER 500 MG PO TB24
500.0000 mg | ORAL_TABLET | Freq: Two times a day (BID) | ORAL | Status: DC
  Filled 2020-01-19 (×2): qty 1

## 2020-01-19 MED ORDER — MUPIROCIN 2 % EX OINT
1.0000 "application " | TOPICAL_OINTMENT | Freq: Two times a day (BID) | CUTANEOUS | Status: DC
  Administered 2020-01-20 – 2020-01-23 (×5): 1 via NASAL
  Filled 2020-01-19 (×2): qty 22

## 2020-01-19 MED ORDER — SODIUM CHLORIDE 0.9 % IV SOLN: Freq: Once | INTRAVENOUS | Status: AC

## 2020-01-19 MED ORDER — INSULIN ASPART 100 UNIT/ML ~~LOC~~ SOLN: 0.0000 [IU] | Freq: Every day | SUBCUTANEOUS | Status: DC

## 2020-01-19 MED ORDER — METOPROLOL SUCCINATE ER 50 MG PO TB24: 150.0000 mg | ORAL_TABLET | Freq: Every day | ORAL | Status: DC

## 2020-01-19 MED ORDER — AMIODARONE IV BOLUS ONLY 150 MG/100ML
150.0000 mg | Freq: Once | INTRAVENOUS | Status: AC
  Administered 2020-01-19: 150 mg via INTRAVENOUS
  Filled 2020-01-19: qty 100

## 2020-01-19 MED ORDER — INSULIN ASPART 100 UNIT/ML ~~LOC~~ SOLN
0.0000 [IU] | Freq: Three times a day (TID) | SUBCUTANEOUS | Status: DC
  Administered 2020-01-20 (×2): 3 [IU] via SUBCUTANEOUS
  Administered 2020-01-21 – 2020-01-24 (×6): 2 [IU] via SUBCUTANEOUS
  Administered 2020-01-24: 3 [IU] via SUBCUTANEOUS
  Filled 2020-01-19 (×9): qty 1

## 2020-01-19 MED ORDER — LACTATED RINGERS IV BOLUS
1000.0000 mL | Freq: Once | INTRAVENOUS | Status: AC
  Administered 2020-01-19: 1000 mL via INTRAVENOUS

## 2020-01-19 MED ORDER — DIGOXIN 250 MCG PO TABS
0.2500 mg | ORAL_TABLET | Freq: Every day | ORAL | Status: DC
  Administered 2020-01-19 – 2020-01-24 (×6): 0.25 mg via ORAL
  Filled 2020-01-19 (×7): qty 1

## 2020-01-19 MED ORDER — FUROSEMIDE 20 MG PO TABS: 20.0000 mg | ORAL_TABLET | Freq: Every day | ORAL | Status: DC | PRN

## 2020-01-19 MED ORDER — SODIUM CHLORIDE 0.9 % IV SOLN: INTRAVENOUS | Status: DC

## 2020-01-19 NOTE — Progress Notes (Signed)
PROGRESS NOTE    Brandy [name]  FTD:[id number] DOB: [birth year] DOA: 01/19/2020 PCP: Glean Hess, MD    Assessment & Plan:   Principal Problem:   Acute lower GI bleeding Active Problems:   Chronic combined systolic and diastolic heart failure, NYHA class 2 (HCC)   BMI 33.0-33.9,adult   Atrial fibrillation, chronic ([place])   Diabetes mellitus type 2 in obese [facility])    Brandy [name] is a 64 y.o. Caucasian female with medical history significant for atrial fibrillation, diastolic CHF, diabetes mellitus, GERD, obesity, OSA presents to the hospital for evaluation of bright red blood per rectum.  She reports she had a colonoscopy and upper endoscopy a week ago by gastroenterology as an outpatient.  She had polyps biopsied.  She has been doing well at home until last night when she developed bright red blood per rectum.   Acute lower GI bleeding 2/2 delayed post-polypectomy bleeding --initial hemoglobin was 14.3 and repeat hemoglobin 12.8 after having multiple bowel movements of blood and blood clots during the night in the emergency room. --GI consulted PLAN: --H&H q8h and transfuse for Hgb <7 --Hold home Eliquis --bowel prep and colonoscopy tomorrow --continue MIVF  Hx of combined systolic and diastolic heart failure, not currently active --Last TTE in Aug 2020 showed normal systolic fxn and undetermined diastolic fxn PLAN: --Hold home lasix PRN due to low BP  Atrial fibrillation, chronic (HCC) on Eliquis --Rate controlled. PLAN: --continue home digoxin --Hold home metop due to low BP --Hold home Eliquis due to bleeding    Diabetes mellitus type 2 ([place]) Patient was diagnosed with diabetes mellitus a few months ago.  She reports her hemoglobin A1c was over 12 in June when she was diagnosised.  --check A1c --SSI for now    BMI 33.0-33.9, adult Follow-up with PCP for dietary last intervention for weight loss.   DVT prophylaxis: SCD/Compression stockings Code Status: Full  code  Family Communication:  Status is: inpatient Dispo:   The patient is from: home Anticipated d/c is to: home Anticipated d/c date is: 2 days Patient currently is not medically stable to d/c due to: Active GI bleeding, pending colonoscopy   Subjective and Interval History:  Pt reported continued bloody BM's.  No abdominal pain.   Objective: Vitals:   01/19/20 1304 01/19/20 1345 01/19/20 1619 01/19/20 1658  BP:  (!) 99/56 113/68 (!) 97/59  Pulse: (!) 101 (!) 129 (!) 105 97  Resp:   18 16  Temp:    97.7 F (36.5 C)  TempSrc:    Oral  SpO2:  97% 96% 96%  Weight:      Height:        Intake/Output Summary (Last 24 hours) at 01/19/2020 1851 Last data filed at 01/19/2020 1800 Gross per 24 hour  Intake 1053.33 ml  Output --  Net 1053.33 ml   Filed Weights   01/18/20 2151  Weight: 92 kg    Examination:   Constitutional: NAD, AAOx3 HEENT: conjunctivae and lids normal, EOMI CV: No cyanosis.   RESP: normal respiratory effort, on RA Extremities: No effusions, edema in BLE SKIN: warm, dry and intact Neuro: II - XII grossly intact.   Psych: Normal mood and affect.  Appropriate judgement and reason   Data Reviewed: I have personally reviewed following labs and imaging studies  CBC: Recent Labs  Lab 01/18/20 2203 01/19/20 0511  WBC 9.9  --   HGB 14.3 12.8  HCT 41.4 37.1  MCV 90.8  --  PLT 284  --    Basic Metabolic Panel: Recent Labs  Lab 01/18/20 2203  NA 141  K 4.0  CL 108  CO2 23  GLUCOSE 170*  BUN 19  CREATININE 0.84  CALCIUM 9.7   GFR: Estimated Creatinine Clearance: 77.8 mL/min (by C-G formula based on SCr of 0.84 mg/dL). Liver Function Tests: Recent Labs  Lab 01/18/20 2203  AST 27  ALT 28  ALKPHOS 72  BILITOT 0.7  PROT 8.0  ALBUMIN 4.3   No results for input(s): LIPASE, AMYLASE in the last 168 hours. No results for input(s): AMMONIA in the last 168 hours. Coagulation Profile: No results for input(s): INR, PROTIME in the last 168  hours. Cardiac Enzymes: No results for input(s): CKTOTAL, CKMB, CKMBINDEX, TROPONINI in the last 168 hours. BNP (last 3 results) No results for input(s): PROBNP in the last 8760 hours. HbA1C: No results for input(s): HGBA1C in the last 72 hours. CBG: No results for input(s): GLUCAP in the last 168 hours. Lipid Profile: No results for input(s): CHOL, HDL, LDLCALC, TRIG, CHOLHDL, LDLDIRECT in the last 72 hours. Thyroid Function Tests: No results for input(s): TSH, T4TOTAL, FREET4, T3FREE, THYROIDAB in the last 72 hours. Anemia Panel: No results for input(s): VITAMINB12, FOLATE, FERRITIN, TIBC, IRON, RETICCTPCT in the last 72 hours. Sepsis Labs: No results for input(s): PROCALCITON, LATICACIDVEN in the last 168 hours.  Recent Results (from the past 240 hour(s))  SARS Coronavirus 2 by RT PCR (hospital order, performed in [facility] hospital lab) Nasopharyngeal Nasopharyngeal Swab     Status: None   Collection Time: 01/19/20  5:47 AM   Specimen: Nasopharyngeal Swab  Result Value Ref Range Status   SARS Coronavirus 2 NEGATIVE NEGATIVE Final    Comment: (NOTE) SARS-CoV-2 target nucleic acids are NOT DETECTED.  The SARS-CoV-2 RNA is generally detectable in upper and lower respiratory specimens during the acute phase of infection. The lowest concentration of SARS-CoV-2 viral copies this assay can detect is 250 copies / mL. A negative result does not preclude SARS-CoV-2 infection and should not be used as the sole basis for treatment or other patient management decisions.  A negative result may occur with improper specimen collection / handling, submission of specimen other than nasopharyngeal swab, presence of viral mutation(s) within the areas targeted by this assay, and inadequate number of viral copies (<250 copies / mL). A negative result must be combined with clinical observations, patient history, and epidemiological information.  Fact Sheet for Patients:     StrictlyIdeas.no  Fact Sheet for Healthcare Providers: BankingDealers.co.za  This test is not yet approved or  cleared by the [place] FDA and has been authorized for detection and/or diagnosis of SARS-CoV-2 by FDA under an Emergency Use Authorization (EUA).  This EUA will remain in effect (meaning this test can be used) for the duration of the COVID-19 declaration under Section 564(b)(1) of the Act, 21 U.S.C. section 360bbb-3(b)(1), unless the authorization is terminated or revoked sooner.  Performed at [facility], [address], [place], [place] [address]       Radiology Studies: DG Abdomen Acute W/Chest  Result Date: 01/19/2020 CLINICAL DATA:  Rectal bleeding after colonoscopy, evaluate for free air EXAM: DG ABDOMEN ACUTE W/ 1V CHEST COMPARISON:  None. FINDINGS: Lungs are clear.  No pleural effusion or pneumothorax. The heart is normal in size. Nonobstructive bowel gas pattern. No evidence of free air under the diaphragm on the upright view. Cholecystectomy clips. Degenerative changes of the lumbar spine. Visualized bony  pelvis appears intact. IMPRESSION: No evidence of acute cardiopulmonary disease. No evidence of small bowel obstruction or free air. Electronically Signed   By: Julian Hy M.D.   On: 01/19/2020 06:17     Scheduled Meds: . digoxin  0.25 mg Oral Daily  . [START ON 01/20/2020] insulin aspart  0-15 Units Subcutaneous TID WC  . insulin aspart  0-5 Units Subcutaneous QHS   Continuous Infusions: . sodium chloride 100 mL/hr at 01/19/20 1128     LOS: 0 days     Enzo Bi, MD [facility] If 7PM-7AM, please contact night-coverage 01/19/2020, 6:51 PM

## 2020-01-19 NOTE — ED Notes (Signed)
Patient resting quietly at this time.

## 2020-01-19 NOTE — Plan of Care (Signed)
Patient oriented to room an dcall bell.  Patient alert and oriented and verbalized understanding to call for help if needed.  Call bell within reach and telephone within reach.IVF infusing. Telemetry placed on patient. Denies pain at this time.

## 2020-01-19 NOTE — ED Notes (Signed)
Patient reports had colonoscopy week ago today and had some polyps removed.  States on Sunday had a normal bowel movement then after that noticed bright red blood instead of stool.  Patient also reports she has had several episodes of bleeding while awaiting treatment room.

## 2020-01-19 NOTE — H&P (Signed)
History and Physical    SARAY CAPASSO GLO:[id number] DOB: February 26, 1956 DOA: 01/19/2020  PCP: Glean Hess, MD   Patient coming from: Home  Chief Complaint: Bleeding from rectum  HPI: Brandy [name] is a 64 y.o. female with medical history significant for atrial fibrillation, diastolic CHF, diabetes mellitus, GERD, obesity, OSA presents to the hospital for evaluation of bright red blood per rectum.  She reports she had a colonoscopy and upper endoscopy a week ago by gastroenterology as an outpatient.  She had polyps biopsied.  She has been doing well at home until last night when she developed bright red blood per rectum.  She reports she had normal bowel movement yesterday evening and a few hours later had to go to the bathroom with a feeling of rectal urgency and she had bright red blood and blood clots per rectum.  She has had multiple episodes of bleeding per rectum and so came to the emergency room.  She has not had any abdominal pain, nausea, vomiting.  She denies any chest pain, palpitations, cough, shortness of breath.  She has not had any fever.  She denies any injury or trauma to her abdomen.  States she has never had GI bleeding in the past.  Her Eliquis was held for 2 days before colonoscopy and was resumed the night after no colonoscopy was performed.  ED Course: In the emergency room patient is a multiple episodes of bleeding with blood clots from her rectum.  Initial hemoglobin was 14.3.  Hemoglobin is rechecked and is now 12.8.  She has no abdominal pain.  ER provider discussed case with on-call gastroenterology who recommended holding Eliquis and having patient admitted to the hospitalist service for further evaluation and monitoring.  Review of Systems:  General: Denies weakness, fever, chills, weight loss, night sweats.  Denies dizziness.  Denies change in appetite HENT: Denies head trauma, headache, denies change in hearing, tinnitus.  Denies nasal congestion or bleeding.  Denies sore  throat, sores in mouth.  Denies difficulty swallowing Eyes: Denies blurry vision, pain in eye, drainage.  Denies discoloration of eyes. Neck: Denies pain.  Denies swelling.  Denies pain with movement. Cardiovascular: Denies chest pain, palpitations.  Denies edema.  Denies orthopnea Respiratory: Denies shortness of breath, cough.  Denies wheezing.  Denies sputum production Gastrointestinal: Reports multiple bouts of bright red bowel movements per rectum.  Denies abdominal pain, swelling.  Denies nausea, vomiting, diarrhea.  Denies melena.  Denies hematemesis. Musculoskeletal: Denies limitation of movement.  Denies deformity or swelling.  Denies pain.  Denies arthralgias or myalgias. Genitourinary: Denies pelvic pain.  Denies urinary frequency or hesitancy.  Denies dysuria.  Skin: Denies rash.  Denies petechiae, purpura, ecchymosis. Neurological: Denies headache.  Denies syncope.  Denies seizure activity.  Denies weakness or paresthesia.  Denies slurred speech, drooping face.  Denies visual change. Psychiatric: Denies depression, anxiety.  Denies suicidal thoughts or ideation.  Denies hallucinations.  Past Medical History:  Diagnosis Date  . Chicken pox   . Chronic systolic CHF (congestive heart failure) ([place])    a. 03/2016 Echo: Ef 15-20%, diff HK, ant AK;  b. 05/2016 Echo: Ef 30-35%, diff HK, mildly dil LA/RA.  . Diabetes mellitus without complication ([place])   . Dysrhythmia   . GERD (gastroesophageal reflux disease)   . Heart murmur   . Hip discomfort    been going on for 40 years   . History of kidney stones   . Moderate mitral regurgitation    a. 03/2016  Echo: mod MR in setting of LV dysfxn.  . Motion sickness    car - back seat  . NICM (nonischemic cardiomyopathy) ([place])    a. 03/2016 Echo: EF 15-20%, diff HK, ant AK, mod MR, mod dil LA, mildly dil RA;  b. 04/2016 Cath: nl cors;  c. 05/2016 Echo: EF 30-35%, diff HK.  . Obesity   . Obstructive sleep apnea    compliant with CPAP  .  Persistent atrial fibrillation ([place])    a. Dx 03/2016;  b. CHA2DS2VASc = 2-->Eliquis 35m BID;  b. 05/2016 Failed DCCV x 4.  . Sleep apnea   . Visit for monitoring Tikosyn therapy 07/24/2016    Past Surgical History:  Procedure Laterality Date  . BIOPSY N/A 01/12/2020   Procedure: BIOPSY;  Surgeon: TVirgel Manifold MD;  Location: [facility]  Service: Endoscopy;  Laterality: N/A;  . CARDIAC CATHETERIZATION N/A 04/17/2016   Procedure: Left Heart Cath and Coronary Angiography;  Surgeon: MWellington Hampshire MD;  [facility];  Service: Cardiovascular;  Laterality: N/A;  . CHOLECYSTECTOMY    . COLONOSCOPY WITH PROPOFOL N/A 01/12/2020   Procedure: COLONOSCOPY WITH PROPOFOL;  Surgeon: TVirgel Manifold MD;  [facility]  Service: Endoscopy;  Laterality: N/A;  Diabetic - oral meds sleep apnea  . ELECTROPHYSIOLOGIC STUDY N/A 05/26/2016   Procedure: Cardioversion;  Surgeon: MWellington Hampshire MD;  Location: [facility];  Service: Cardiovascular;  Laterality: N/A;  . ESOPHAGOGASTRODUODENOSCOPY (EGD) WITH PROPOFOL N/A 01/12/2020   Procedure: ESOPHAGOGASTRODUODENOSCOPY (EGD) WITH PROPOFOL;  Surgeon: TVirgel Manifold MD;  [facility]  Service: Endoscopy;  Laterality: N/A;  . POLYPECTOMY N/A 01/12/2020   Procedure: POLYPECTOMY;  Surgeon: TVirgel Manifold MD;  Location: [facility]  Service: Endoscopy;  Laterality: N/A;    Social History  reports that she has never smoked. She has never used smokeless tobacco. She reports previous alcohol use. She reports that she does not use drugs.  No Known Allergies  Family History  Problem Relation Age of Onset  . COPD Mother   . COPD Father   . Heart disease Father   . Hypertension Father   . Diabetes Paternal Aunt      Prior to Admission medications   Medication Sig Start Date End Date Taking? Authorizing Provider  apixaban (ELIQUIS) 5 MG TABS tablet Take 1 tablet (5 mg  total) by mouth 2 (two) times daily. 12/16/19  Yes KDeboraha Sprang MD  blood glucose meter kit and supplies KIT Dispense based on patient and insurance preference. Use 1-2 times per day to test BS 10/24/19  Yes BGlean Hess MD  digoxin (LANOXIN) 0.25 MG tablet Take 1 tablet (0.25 mg total) by mouth daily. 12/24/19  Yes KDeboraha Sprang MD  furosemide (LASIX) 20 MG tablet Take 20 mg by mouth daily as needed for fluid or edema.   Yes [provider]  MAGNESIUM GLUCONATE PO Take 200 mg by mouth daily.    Yes [provider]  metFORMIN (GLUCOPHAGE-XR) 500 MG 24 hr tablet Take 1 tablet (500 mg total) by mouth 2 (two) times daily before a meal. 10/24/19  Yes BGlean Hess MD  metoprolol succinate (TOPROL-XL) 100 MG 24 hr tablet Take 1.5 tablets (150 mg) by mouth once daily. Take with or immediately following a meal. 04/25/19  Yes KDeboraha Sprang MD  zinc gluconate 50 MG tablet Take 50 mg by mouth daily.   Yes [provider]  Cholecalciferol (VITAMIN D3) 1000 units CAPS Take 1 capsule by mouth 2 (two) times daily.  Patient not taking: Reported on 01/19/2020    [provider]  CINNAMON PO Take 2 capsules by mouth daily.  Patient not taking: Reported on 01/19/2020    [provider]  CRANBERRY PO Take 1 capsule by mouth daily as needed. Soft gel  Patient not taking: Reported on 01/19/2020    [provider]  [place] 1 application topically daily as needed.  Patient not taking: Reported on 01/19/2020    [provider]  Lovell [place] OIL Take 1 application by mouth daily as needed.  Patient not taking: Reported on 01/19/2020    [provider]  Lemongrass Oil OIL Apply 1 application topically daily as needed.  Patient not taking: Reported on 01/19/2020    [provider]  MELALEUCA [place] Apply 1 application topically daily as needed. Oil  Patient not taking: Reported on 01/19/2020    [provider]  Multiple  Vitamins-Minerals (MULTIVITAMIN WITH MINERALS) tablet Take 1 tablet by mouth daily. Patient not taking: Reported on 01/19/2020    [provider]  OIL OF [place]O PO Take 1 application by mouth daily. To left big toe    [provider]  peppermint oil liquid Apply 1 application topically daily as needed.  Patient not taking: Reported on 01/19/2020    [provider]  polyethylene glycol-electrolytes (NULYTELY) 420 g solution Prepare according to package instructions. Starting at 5:00 PM: Drink one 8 oz glass of mixture every 15 minutes until you finish half of the jug. Five hours prior to procedure, drink 8 oz glass of mixture every 15 minutes until it is all gone. Make sure you do not drink anything 4 hours prior to your procedure. Patient not taking: Reported on 01/19/2020 12/15/19   Virgel Manifold, MD  vitamin C (ASCORBIC ACID) 500 MG tablet Take 500 mg by mouth daily.  Patient not taking: Reported on 01/19/2020    [provider]    Physical Exam: Vitals:   01/18/20 2151 01/19/20 0037  BP: 139/60 (!) 132/59  Pulse: 90 76  Resp: 18 18  Temp: 98.3 F (36.8 C) 98.3 F (36.8 C)  TempSrc:  Oral  SpO2: 98% 97%  Weight: 92 kg   Height: 5' 7"  (1.702 m)     Constitutional: NAD, calm, comfortable Vitals:   01/18/20 2151 01/19/20 0037  BP: 139/60 (!) 132/59  Pulse: 90 76  Resp: 18 18  Temp: 98.3 F (36.8 C) 98.3 F (36.8 C)  TempSrc:  Oral  SpO2: 98% 97%  Weight: 92 kg   Height: 5' 7"  (1.702 m)    General: WDWN, Alert and oriented x3.  Eyes: EOMI, PERRL, lids and conjunctivae normal.  Sclera nonicteric HENT:  [place]/AT, external ears normal.  Nares patent without epistasis.  Mucous membranes are moist. Posterior pharynx clear of any exudate or lesions Neck: Soft, normal range of motion, supple, no masses, no thyromegaly.  Trachea midline Respiratory: clear to auscultation bilaterally, no wheezing, no crackles. Normal respiratory effort. No accessory  muscle use.  Cardiovascular: Irregular regular rhythm with normal rate. 2/6 systolic murmurs. No rubs / gallops. No extremity edema. 2+ pedal pulses Abdomen: Soft, no tenderness, nondistended, Obese. no rebound or guarding.  No masses palpated. Bowel sounds normoactive Musculoskeletal: FROM. no clubbing / cyanosis. No joint deformity upper and lower extremities. Normal muscle tone.  Skin: Warm, dry, intact no rashes, lesions, ulcers. No induration  Neurologic: CN 2-12 grossly intact.  Normal speech.  Sensation intact, patella DTR +1 bilaterally. Strength 5/5 in all extremities.   Psychiatric: Normal judgment and insight.  Normal mood.    Labs on Admission: I have personally reviewed following labs and imaging studies  CBC: Recent Labs  Lab 01/18/20 2203 01/19/20 0511  WBC 9.9  --   HGB 14.3 12.8  HCT 41.4 37.1  MCV 90.8  --   PLT 284  --     Basic Metabolic Panel: Recent Labs  Lab 01/18/20 2203  NA 141  K 4.0  CL 108  CO2 23  GLUCOSE 170*  BUN 19  CREATININE 0.84  CALCIUM 9.7    GFR: Estimated Creatinine Clearance: 77.8 mL/min (by C-G formula based on SCr of 0.84 mg/dL).  Liver Function Tests: Recent Labs  Lab 01/18/20 2203  AST 27  ALT 28  ALKPHOS 72  BILITOT 0.7  PROT 8.0  ALBUMIN 4.3    Urine analysis: No results found for: COLORURINE, APPEARANCEUR, LABSPEC, PHURINE, GLUCOSEU, HGBUR, BILIRUBINUR, KETONESUR, PROTEINUR, UROBILINOGEN, NITRITE, LEUKOCYTESUR  Radiological Exams on Admission: No results found.  Assessment/Plan Principal Problem:   Acute lower GI bleeding Ms. Pint is admitted to medical surgical floor.  His initial hemoglobin was 14.3 and repeat hemoglobin 12.8 after having multiple bowel movements of blood and blood clots during the night in the emergency room. ER provider discussed with on-call gastroenterology, Dr. Alice Reichert who stated he will see the patient in the morning for further evaluation. Eliquis is held with acute GI  bleeding. Will check H&H every 6 hours.  IVF hydration provided.  Pt will be kept NPO until GI evaluates pt in case intervention is required.   Active Problems:   Chronic combined systolic and diastolic heart failure, NYHA class 2 ([place]) Patient continued on Lasix and digoxin.  Continue metoprolol.  Patient not fluid overloaded at this time    Atrial fibrillation, chronic ([place]) Patient be continued on metoprolol and digoxin.  Eliquis is held with acute GI bleeding.  Patient understands the rationale for this and understands the risk of cardioembolic stroke while being off of anticoagulation and understands that while she is having an active GI bleed anticoagulation therapy needs to be held.    Diabetes mellitus type 2 in obese [facility]) Patient was diagnosed with diabetes mellitus a few months ago.  She reports her hemoglobin A1c was over 12 in June when she was diagnosised. Will recheck HgbA1c so that she will have value known when she has follow up with PCP.  She supposed to have a follow-up appointment with her PCP tomorrow for recheck but this will be rescheduled as she is being admitted for bleed    BMI 33.0-33.9,adult Follow-up with PCP for dietary last intervention for weight loss.    DVT prophylaxis: Ms. Weathersby has been on Eliquis which is now held secondary to GI bleed.  Placed on SCDs for DVT prophylaxis Code Status:   Full code Family Communication:  Diagnosis and plan discussed with patient.  Patient verbalized understanding agrees with plan.  Questions answered.  Further recommendation to follow as clinical indicated Disposition Plan:   Patient is from:  Home  Anticipated DC to:  Home  Anticipated DC date:  Anticipate 2 midnight stay in the hospital to treat acute medical condition  Anticipated DC barriers: No barriers to discharge identified at this time  Consults called:  Gastroenterology, Dr. Alice Reichert.  Consult orders placed and secure chart message sent to Dr. Alice Reichert.  ER  provider did discuss case with Dr. Alice Reichert Admission status:  Inpatient  Severity of Illness: The appropriate patient status for this patient is INPATIENT. Inpatient status is judged to be reasonable and necessary in order to provide the required intensity of service to ensure the patient's safety. The patient's presenting symptoms, physical exam findings, and initial radiographic and laboratory data in the context of their chronic comorbidities is felt to place them at high risk for further clinical deterioration. Furthermore, it is not anticipated that the patient will be medically stable for discharge from the hospital within 2 midnights of admission. The following factors support the patient status of inpatient.   * I certify that at the point of admission it is my clinical judgment that the patient will require inpatient hospital care spanning beyond 2 midnights from the point of admission due to high intensity of service, high risk for further deterioration and high frequency of surveillance required.Yevonne Aline Jetaun Colbath MD [facility]  How to contact the [facility] Attending or Consulting provider [place] or covering provider during after hours [place], for this patient?   1. Check the care team in [facility] and look for a) attending/consulting [facility] provider listed and b) the [facility] team listed 2. Log into [email or web address] and use [place] universal password to access. If you do not have the password, please contact the hospital operator. 3. Locate the [facility] provider you are looking for under [facility] and page to a number that you can be directly reached. 4. If you still have difficulty reaching the provider, please page the [facility] (Director on Call) for the Hospitalists listed on amion for assistance.  01/19/2020, 5:58 AM

## 2020-01-19 NOTE — ED Notes (Signed)
Pt had large amount of bright red blood during bowel movement with clots.

## 2020-01-19 NOTE — ED Provider Notes (Signed)
[facility] Emergency Department Provider Note  ____________________________________________   First MD Initiated Contact with Patient 01/19/20 [phone]     (approximate)  I have reviewed the triage vital signs and the nursing notes.   HISTORY  Chief Complaint Rectal Bleeding and Post-op Problem    HPI Brandy [name] is a 64 y.o. female with medical history as listed below which notably includes paroxysmal atrial fibrillation on Eliquis and colonoscopy by Dr. Bonna Gains about 5 to 6 days ago.  She presents tonight for acute onset and severe rectal bleeding.  She stopped taking the Eliquis about 2 days before her procedure and at the doctor's recommendation she resumed it the night after her procedure.  She reports that multiple polyps were removed.  She had no abnormalities of her bowel movements including no blood until yesterday when she had bowel urgency and some mild lower abdominal cramping.  She went to the bathroom and had a large volume bowel movement with gross blood.  She called the GI doctor on call and was told to come to the emergency department.  She waited for an extended period of time in the waiting room due to overwhelming ED and hospital patient volumes.  During that time she says that she had at least 5 bowel movements that were "pure blood".  She is not having any abdominal pain at this time although she has an occasional lower abdominal cramping.  She denies fever/chills, sore throat, chest pain, shortness of breath, nausea, and vomiting.  Nothing in particular makes the symptoms better or worse.  She last had a dose of Eliquis yesterday.         Past Medical History:  Diagnosis Date  . Chicken pox   . Chronic systolic CHF (congestive heart failure) ([place])    a. 03/2016 Echo: Ef 15-20%, diff HK, ant AK;  b. 05/2016 Echo: Ef 30-35%, diff HK, mildly dil LA/RA.  . Diabetes mellitus without complication ([place])   . Dysrhythmia   . GERD (gastroesophageal  reflux disease)   . Heart murmur   . Hip discomfort    been going on for 40 years   . History of kidney stones   . Moderate mitral regurgitation    a. 03/2016 Echo: mod MR in setting of LV dysfxn.  . Motion sickness    car - back seat  . NICM (nonischemic cardiomyopathy) ([place])    a. 03/2016 Echo: EF 15-20%, diff HK, ant AK, mod MR, mod dil LA, mildly dil RA;  b. 04/2016 Cath: nl cors;  c. 05/2016 Echo: EF 30-35%, diff HK.  . Obesity   . Obstructive sleep apnea    compliant with CPAP  . Persistent atrial fibrillation ([place])    a. Dx 03/2016;  b. CHA2DS2VASc = 2-->Eliquis 45m BID;  b. 05/2016 Failed DCCV x 4.  . Sleep apnea   . Visit for monitoring Tikosyn therapy 07/24/2016    Patient Active Problem List   Diagnosis Date Noted  . Acute lower GI bleeding 01/19/2020  . Atrial fibrillation, chronic ([place] 01/19/2020  . Diabetes mellitus type 2 in obese ([place] 01/19/2020  . Gastric polyp   . Stomach irritation   . Special screening for malignant neoplasms, colon   . Polyp of colon   . Nasal sinus congestion 10/20/2019  . Weight loss, non-intentional 10/20/2019  . Hyperglycemia 10/20/2019  . Varicose veins of both lower extremities 10/20/2019  . Dysphagia 10/20/2019  . Osteoarthritis of right hand 10/31/2016  . Persistent atrial  fibrillation ([place])   . Chronic combined systolic and diastolic heart failure, NYHA class 2 ([place]) 04/14/2016  . Nonischemic cardiomyopathy ([place]) 04/14/2016  . BMI 33.0-33.9,adult 04/14/2016    Past Surgical History:  Procedure Laterality Date  . BIOPSY N/A 01/12/2020   Procedure: BIOPSY;  Surgeon: Virgel Manifold, MD;  Location: [facility];  Service: Endoscopy;  Laterality: N/A;  . CARDIAC CATHETERIZATION N/A 04/17/2016   Procedure: Left Heart Cath and Coronary Angiography;  Surgeon: Wellington Hampshire, MD;  Location: [facility];  Service: Cardiovascular;  Laterality: N/A;  . CHOLECYSTECTOMY    . COLONOSCOPY WITH PROPOFOL N/A 01/12/2020     Procedure: COLONOSCOPY WITH PROPOFOL;  Surgeon: Virgel Manifold, MD;  Location: [place];  Service: Endoscopy;  Laterality: N/A;  Diabetic - oral meds sleep apnea  . ELECTROPHYSIOLOGIC STUDY N/A 05/26/2016   Procedure: Cardioversion;  Surgeon: Wellington Hampshire, MD;  Location: [facility];  Service: Cardiovascular;  Laterality: N/A;  . ESOPHAGOGASTRODUODENOSCOPY (EGD) WITH PROPOFOL N/A 01/12/2020   Procedure: ESOPHAGOGASTRODUODENOSCOPY (EGD) WITH PROPOFOL;  Surgeon: Virgel Manifold, MD;  Location: [facility];  Service: Endoscopy;  Laterality: N/A;  . POLYPECTOMY N/A 01/12/2020   Procedure: POLYPECTOMY;  Surgeon: Virgel Manifold, MD;  Location: [place];  Service: Endoscopy;  Laterality: N/A;    Prior to Admission medications   Medication Sig Start Date End Date Taking? Authorizing Provider  apixaban (ELIQUIS) 5 MG TABS tablet Take 1 tablet (5 mg total) by mouth 2 (two) times daily. 12/16/19  Yes Deboraha Sprang, MD  blood glucose meter kit and supplies KIT Dispense based on patient and insurance preference. Use 1-2 times per day to test BS 10/24/19  Yes Glean Hess, MD  digoxin (LANOXIN) 0.25 MG tablet Take 1 tablet (0.25 mg total) by mouth daily. 12/24/19  Yes Deboraha Sprang, MD  furosemide (LASIX) 20 MG tablet Take 20 mg by mouth daily as needed for fluid or edema.   Yes [provider]  MAGNESIUM GLUCONATE PO Take 200 mg by mouth daily.    Yes [provider]  metFORMIN (GLUCOPHAGE-XR) 500 MG 24 hr tablet Take 1 tablet (500 mg total) by mouth 2 (two) times daily before a meal. 10/24/19  Yes Glean Hess, MD  metoprolol succinate (TOPROL-XL) 100 MG 24 hr tablet Take 1.5 tablets (150 mg) by mouth once daily. Take with or immediately following a meal. 04/25/19  Yes Deboraha Sprang, MD  zinc gluconate 50 MG tablet Take 50 mg by mouth daily.   Yes [provider]  Cholecalciferol (VITAMIN D3) 1000 units CAPS Take 1 capsule  by mouth 2 (two) times daily.  Patient not taking: Reported on 01/19/2020    [provider]  CINNAMON PO Take 2 capsules by mouth daily.  Patient not taking: Reported on 01/19/2020    [provider]  CRANBERRY PO Take 1 capsule by mouth daily as needed. Soft gel  Patient not taking: Reported on 01/19/2020    [provider]  [place] 1 application topically daily as needed.  Patient not taking: Reported on 01/19/2020    [provider]  Lovell Sheehan OIL Take 1 application by mouth daily as needed.  Patient not taking: Reported on 01/19/2020    [provider]  [place]rass Oil OIL Apply 1 application topically daily as needed.  Patient not taking: Reported on 01/19/2020    [provider]  MELALEUCA [place] Apply 1 application topically  daily as needed. Oil  Patient not taking: Reported on 01/19/2020    [provider]  Multiple Vitamins-Minerals (MULTIVITAMIN WITH MINERALS) tablet Take 1 tablet by mouth daily. Patient not taking: Reported on 01/19/2020    [provider]  OIL OF OREGANO PO Take 1 application by mouth daily. To left big toe    [provider]  peppermint oil liquid Apply 1 application topically daily as needed.  Patient not taking: Reported on 01/19/2020    [provider]  polyethylene glycol-electrolytes (NULYTELY) 420 g solution Prepare according to package instructions. Starting at 5:00 PM: Drink one 8 oz glass of mixture every 15 minutes until you finish half of the jug. Five hours prior to procedure, drink 8 oz glass of mixture every 15 minutes until it is all gone. Make sure you do not drink anything 4 hours prior to your procedure. Patient not taking: Reported on 01/19/2020 12/15/19   Virgel Manifold, MD  vitamin C (ASCORBIC ACID) 500 MG tablet Take 500 mg by mouth daily.  Patient not taking: Reported on 01/19/2020    [provider]    Allergies Patient has no known  allergies.  Family History  Problem Relation Age of Onset  . COPD Mother   . COPD Father   . Heart disease Father   . Hypertension Father   . Diabetes Paternal Aunt     Social History Social History   Tobacco Use  . Smoking status: Never Smoker  . Smokeless tobacco: Never Used  Vaping Use  . Vaping Use: Never used  Substance Use Topics  . Alcohol use: Not Currently  . Drug use: No    Review of Systems Constitutional: No fever/chills Eyes: No visual changes. ENT: No sore throat. Cardiovascular: Denies chest pain. Respiratory: Denies shortness of breath. Gastrointestinal: Gross blood per rectum and bowel urgency.  Intermittent lower abdominal cramping. Genitourinary: Negative for dysuria. Musculoskeletal: Negative for neck pain.  Negative for back pain. Integumentary: Negative for rash. Neurological: Negative for headaches, focal weakness or numbness.   ____________________________________________   PHYSICAL EXAM:  VITAL SIGNS: ED Triage Vitals  Enc Vitals Group     BP 01/18/20 2151 139/60     Pulse Rate 01/18/20 2151 90     Resp 01/18/20 2151 18     Temp 01/18/20 2151 98.3 F (36.8 C)     Temp Source 01/19/20 0037 Oral     SpO2 01/18/20 2151 98 %     Weight 01/18/20 2151 92 kg (202 lb 13.2 oz)     Height 01/18/20 2151 1.702 m ('5\' 7"' )     Head Circumference --      Peak Flow --      Pain Score 01/18/20 2151 1     Pain Loc --      Pain Edu? --      Excl. in [place]? --     Constitutional: Alert and oriented.  Eyes: Conjunctivae are normal.  Head: Atraumatic. Nose: No congestion/rhinnorhea. Mouth/Throat: Patient is wearing a mask. Neck: No stridor.  No meningeal signs.   Cardiovascular: Normal rate, regular rhythm. Good peripheral circulation. Grossly normal heart sounds. Respiratory: Normal respiratory effort.  No retractions. Gastrointestinal: Soft and nontender. No distention.  Rectal exam is notable for bright red blood and clots, also some  melena. Musculoskeletal: No lower extremity tenderness nor edema. No gross deformities of extremities. Neurologic:  Normal speech and language. No gross focal neurologic deficits are appreciated.  Skin:  Skin is warm, dry  and intact. Psychiatric: Mood and affect are normal. Speech and behavior are normal.  ____________________________________________   LABS (all labs ordered are listed, but only abnormal results are displayed)  Labs Reviewed  COMPREHENSIVE METABOLIC PANEL - Abnormal; Notable for the following components:      Result Value   Glucose, Bld 170 (*)    All other components within normal limits  SARS CORONAVIRUS 2 BY RT PCR (HOSPITAL ORDER, [facility])  CBC  HEMOGLOBIN AND HEMATOCRIT, BLOOD  TYPE AND SCREEN   ____________________________________________  EKG  No indication for emergent EKG ____________________________________________  RADIOLOGY Ursula Alert, personally viewed and evaluated these images (plain radiographs) as part of my medical decision making, as well as reviewing the written report by the radiologist.  ED MD interpretation: No acute abnormalities identified on acute abdomen series of x-rays.  Specifically no evidence of free air or SBO.  Official radiology report(s): DG Abdomen Acute W/Chest  Result Date: 01/19/2020 CLINICAL DATA:  Rectal bleeding after colonoscopy, evaluate for free air EXAM: DG ABDOMEN ACUTE W/ 1V CHEST COMPARISON:  None. FINDINGS: Lungs are clear.  No pleural effusion or pneumothorax. The heart is normal in size. Nonobstructive bowel gas pattern. No evidence of free air under the diaphragm on the upright view. Cholecystectomy clips. Degenerative changes of the lumbar spine. Visualized bony pelvis appears intact. IMPRESSION: No evidence of acute cardiopulmonary disease. No evidence of small bowel obstruction or free air. Electronically Signed   By: Julian Hy M.D.   On: 01/19/2020 06:17     ____________________________________________   PROCEDURES   Procedure(s) performed (including Critical Care):  Procedures   ____________________________________________   INITIAL IMPRESSION / MDM / [place] / ED COURSE  As part of my medical decision making, I reviewed the following data within the [place] notes reviewed and incorporated, Labs reviewed , Old chart reviewed, Radiograph reviewed , Discussed with admitting physician (Dr. Tonie Griffith), Discussed with gastroenterologist (Dr. Alice Reichert) and Notes from prior ED visits   Differential diagnosis includes, but is not limited to, post polypectomy bleed, AV malformation, hemorrhoid, hemorrhagic shock, neoplasm.  The patient was fine post colonoscopy until the last 24 hours and she has had multiple episodes of bloody stool since then.  I suspect she has bleeding from one of her polypectomy sites.  She has no significant tenderness to palpation of the abdomen but I will obtain acute abdomen series to make sure there is no sign of free air.  I do not think she would benefit from a CT scan at this time.  She is on Eliquis for A. fib but given that she is hemodynamically stable with normal labs at triage and even after 9 hours in the emergency department she has no tachycardia and is normotensive, I do not believe she would benefit from emergent anticoagulation reversal.  I have asked her to remain n.p.o. and I will contact the gastroenterologist on call to discuss the case but anticipate admission for conservative management.  I will repeat a hemoglobin and hematocrit now that an extended period of time has passed since the initial study and she has had more bowel movements.       Clinical Course as of Jan 18 725  Mon Jan 19, 2020  7017 I discussed the case by phone with Dr. Alice Reichert who is on-call for gastroenterology.  After discussing the case he agreed with the plan for hospitalist admission and  holding Eliquis but he agreed that  no other aggressive measures such as anticoagulation reversal were indicated at this time.  He has the patient's name and is anticipating a consult in the morning.  Repeat H&H is pending.  I will keep the patient n.p.o. and start an infusion of normal saline for hydration.   [CF]  [id number] Consulted the hospitalist for admission.   [CF]  [phone] Discussed case by phone with Dr. Tonie Griffith who will admit.   [CF]  [phone] Patient has had a 1.5 g/dl drop in Hb since the first blood test.  Hemoglobin: 12.8 [CF]    Clinical Course User Index [CF] Hinda Kehr, MD     ____________________________________________  FINAL CLINICAL IMPRESSION(S) / ED DIAGNOSES  Final diagnoses:  Rectal bleeding     MEDICATIONS GIVEN DURING THIS VISIT:  Medications  0.9 %  sodium chloride infusion ( Intravenous New Bag/Given 01/19/20 0558)     ED Discharge Orders    None      *Please note:  Brandy [name] was evaluated in Emergency Department on 01/19/2020 for the symptoms described in the history of present illness. She was evaluated in the context of the global COVID-19 pandemic, which necessitated consideration that the patient might be at risk for infection with the SARS-CoV-2 virus that causes COVID-19. Institutional protocols and algorithms that pertain to the evaluation of patients at risk for COVID-19 are in a state of rapid change based on information released by regulatory bodies including the CDC and federal and state organizations. These policies and algorithms were followed during the patient's care in the ED.  Some ED evaluations and interventions may be delayed as a result of limited staffing during and after the pandemic.*  Note:  This document was prepared using Dragon voice recognition software and may include unintentional dictation errors.   Hinda Kehr, MD 01/19/20 [phone]

## 2020-01-19 NOTE — Consult Note (Signed)
GI Inpatient Consult Note  Reason for Consult: Lower GI Bleeding   Attending Requesting Consult: Dr. Enzo Bi  History of Present Illness: Brandy [name] is a 64 y.o. female seen for evaluation of lower GI bleed s/p recent colonoscopy at the request of Dr. Enzo Bi. Patient has a PMH of PAF on chronic anticoagulation with Eliquis, chronic systolic CHF, NICM, R4WN, obesity, and OSA. Patient had screening colonoscopy and EGD performed by Dr. Bonna Gains last Monday (08/30) at [facility] where four right-sided colon polyps were found and two polyps in sigmoid colon. One large polyp in ascending colon was not removed, but was tattooed. See below for full procedure details. She resumed her Eliquis on Monday night and her last dose was yesterday. She reports she started to feel some lower abdominal cramping and urgency to have a BM and had two episodes of hematochezia last night. She called in to her primary gastroenterology office and she was advised to go to the ED immediately for further evaluation by Dr. Marius Ditch. She unfortunately had to wait an extended amount of time (>7.5 hours) in the waiting room due to the overwhelming ED and hospital patient volumes. She reports while waiting in the ED she had at least 5 additional episodes of hematochezia where she was just passing frank blood. Upon admission to the ED, she had hemoglobin 14.3 and was rechecked hours later and found to be 12.8. She was admitted for further observation and evaluation. She reports she has had 5 additional episodes of hematochezia with passing gross blood and some clots. No further abdominal cramping. She denies fevers, chills, nausea, vomiting, chest pain, shortness of breath, or dizziness. She denies any previous episodes of GI bleeding. No known family history of colorectal cancer or advanced adenomas. She reports one previous colonoscopy at age 35 which was reportedly normal. She took her last dose of Eliquis around 6:30 PM last night. She reports  she was told she was referred to [facility] for polypectomy due to large polyp at advise of Dr. Bonna Gains. She has follow-up with her next week to go over the pathology results from her colonoscopy and endoscopy.     Last Colonoscopy:  01/12/20 (Dr. Bonna Gains) -one 8 mm polyp at IC valve removed with cold snare with path showing TA -one 12 mm polyp in ascending colon removed with hot snare with path showing TA -one large polyp in ascending colon. Tattooed. Path showing TA. -one 15 mm polyp in ascending colon removed piecemeal with cold snare. Tattooed. Path showing fragments of TVA. -two 9-12 mm polyps in sigmoid colon removed with cold snare with path showing TVA -sigmoid diverticulosis   Last Endoscopy:  01/12/20 (Dr. Bonna Gains) -Normal esophagus. Biopsies negative for EoE or intestinal metaplasia.  -Gastritis -Few benign gastric polyps -Normal duodenal bulb  Past Medical History:  Past Medical History:  Diagnosis Date  . Chicken pox   . Chronic systolic CHF (congestive heart failure) ([place])    a. 03/2016 Echo: Ef 15-20%, diff HK, ant AK;  b. 05/2016 Echo: Ef 30-35%, diff HK, mildly dil LA/RA.  . Diabetes mellitus without complication ([place])   . Dysrhythmia   . GERD (gastroesophageal reflux disease)   . Heart murmur   . Hip discomfort    been going on for 40 years   . History of kidney stones   . Moderate mitral regurgitation    a. 03/2016 Echo: mod MR in setting of LV dysfxn.  . Motion sickness    car - back seat  .  NICM (nonischemic cardiomyopathy) ([place])    a. 03/2016 Echo: EF 15-20%, diff HK, ant AK, mod MR, mod dil LA, mildly dil RA;  b. 04/2016 Cath: nl cors;  c. 05/2016 Echo: EF 30-35%, diff HK.  . Obesity   . Obstructive sleep apnea    compliant with CPAP  . Persistent atrial fibrillation ([place])    a. Dx 03/2016;  b. CHA2DS2VASc = 2-->Eliquis 5mg  BID;  b. 05/2016 Failed DCCV x 4.  . Sleep apnea   . Visit for monitoring Tikosyn therapy 07/24/2016    Problem List: Patient  Active Problem List   Diagnosis Date Noted  . Acute lower GI bleeding 01/19/2020  . Atrial fibrillation, chronic ([place]) 01/19/2020  . Diabetes mellitus type 2 in obese ([place]) 01/19/2020  . Gastric polyp   . Stomach irritation   . Special screening for malignant neoplasms, colon   . Polyp of colon   . Nasal sinus congestion 10/20/2019  . Weight loss, non-intentional 10/20/2019  . Hyperglycemia 10/20/2019  . Varicose veins of both lower extremities 10/20/2019  . Dysphagia 10/20/2019  . Osteoarthritis of right hand 10/31/2016  . Persistent atrial fibrillation ([place])   . Chronic combined systolic and diastolic heart failure, NYHA class 2 ([place]) 04/14/2016  . Nonischemic cardiomyopathy ([place]) 04/14/2016  . BMI 33.0-33.9,adult 04/14/2016    Past Surgical History: Past Surgical History:  Procedure Laterality Date  . BIOPSY N/A 01/12/2020   Procedure: BIOPSY;  Surgeon: Virgel Manifold, MD;  Location: [facility];  Service: Endoscopy;  Laterality: N/A;  . CARDIAC CATHETERIZATION N/A 04/17/2016   Procedure: Left Heart Cath and Coronary Angiography;  Surgeon: Wellington Hampshire, MD;  Location: [facility];  Service: Cardiovascular;  Laterality: N/A;  . CHOLECYSTECTOMY    . COLONOSCOPY WITH PROPOFOL N/A 01/12/2020   Procedure: COLONOSCOPY WITH PROPOFOL;  Surgeon: Virgel Manifold, MD;  Location: [facility];  Service: Endoscopy;  Laterality: N/A;  Diabetic - oral meds sleep apnea  . ELECTROPHYSIOLOGIC STUDY N/A 05/26/2016   Procedure: Cardioversion;  Surgeon: Wellington Hampshire, MD;  Location: [facility];  Service: Cardiovascular;  Laterality: N/A;  . ESOPHAGOGASTRODUODENOSCOPY (EGD) WITH PROPOFOL N/A 01/12/2020   Procedure: ESOPHAGOGASTRODUODENOSCOPY (EGD) WITH PROPOFOL;  Surgeon: Virgel Manifold, MD;  Location: [facility];  Service: Endoscopy;  Laterality: N/A;  . POLYPECTOMY N/A 01/12/2020   Procedure: POLYPECTOMY;  Surgeon: Virgel Manifold, MD;   Location: [facility];  Service: Endoscopy;  Laterality: N/A;    Allergies: No Known Allergies  Home Medications: (Not in a hospital admission)  Home medication reconciliation was completed with the patient.   Scheduled Inpatient Medications:    Continuous Inpatient Infusions:    PRN Inpatient Medications:    Family History: family history includes COPD in her father and mother; Diabetes in her paternal aunt; Heart disease in her father; Hypertension in her father.  The patient's family history is negative for inflammatory bowel disorders, GI malignancy, or solid organ transplantation.  Social History:   reports that she has never smoked. She has never used smokeless tobacco. She reports previous alcohol use. She reports that she does not use drugs. The patient denies ETOH, tobacco, or drug use.   Review of Systems: Constitutional: Weight is stable.  Eyes: No changes in vision. ENT: No oral lesions, sore throat.  GI: see HPI.  Heme/Lymph: No easy bruising.  CV: No chest pain.  GU: No hematuria.  Integumentary: No rashes.  Neuro: No headaches.  Psych: No depression/anxiety.  Endocrine:  No heat/cold intolerance.  Allergic/Immunologic: No urticaria.  Resp: No cough, SOB.  Musculoskeletal: No joint swelling.    Physical Examination: BP 108/67   Pulse (!) 105   Temp 98.3 F (36.8 C) (Oral)   Resp 18   Ht 5\' 7"  (1.702 m)   Wt 92 kg   SpO2 99%   BMI 31.77 kg/m  Gen: NAD, alert and oriented x 4 HEENT: PEERLA, EOMI, Neck: supple, no JVD or thyromegaly Chest: CTA bilaterally, no wheezes, crackles, or other adventitious sounds CV: RRR, no m/g/c/r Abd: soft, NT, ND, +BS in all four quadrants; no HSM, guarding, ridigity, or rebound tenderness Ext: no edema, well perfused with 2+ pulses, Skin: no rash or lesions noted Lymph: no LAD  Data: Lab Results  Component Value Date   WBC 9.9 01/18/2020   HGB 12.8 01/19/2020   HCT 37.1 01/19/2020   MCV 90.8  01/18/2020   PLT 284 01/18/2020   Recent Labs  Lab 01/18/20 2203 01/19/20 0511  HGB 14.3 12.8   Lab Results  Component Value Date   NA 141 01/18/2020   K 4.0 01/18/2020   CL 108 01/18/2020   CO2 23 01/18/2020   BUN 19 01/18/2020   CREATININE 0.84 01/18/2020   Lab Results  Component Value Date   ALT 28 01/18/2020   AST 27 01/18/2020   ALKPHOS 72 01/18/2020   BILITOT 0.7 01/18/2020   No results for input(s): APTT, INR, PTT in the last 168 hours.   Last Colonoscopy:  01/12/20 (Dr. Bonna Gains) -one 8 mm polyp at IC valve removed with cold snare with path showing TA -one 12 mm polyp in ascending colon removed with hot snare with path showing TA -one large polyp in ascending colon. Tattooed. Path showing TA. -one 15 mm polyp in ascending colon removed piecemeal with cold snare. Tattooed. Path showing fragments of TVA. -two 9-12 mm polyps in sigmoid colon removed with cold snare with path showing TVA -sigmoid diverticulosis   Last Endoscopy:  01/12/20 (Dr. Bonna Gains) -Normal esophagus. Biopsies negative for EoE or intestinal metaplasia.  -Gastritis -Few benign gastric polyps -Normal duodenal bulb  Assessment/Plan:  64 y/o Caucasian female with a PMH of PAF on chronic anticoagulation with Eliquis, chronic systolic CHF, NICM, G9JM, obesity, and OSA presents for post-polypectomy lower GI bleed  1. Lower GI bleeding 2/2 delayed post-polypectomy bleeding  2. Chronic anticoagulation - last dose Eliquis 6:30 PM last night  -Clinical presentation and history is highly consistent with delayed post-polypectomy bleeding. She has multiple risk factors for PPB, including size (>10 mm) and location (right-sided) colon polyps, age (64 years old), and chronic anticoagulation -No clips were placed at time of colonoscopy on 08/30 -She is hemodynamically stable with no significant drop in her hemoglobin -Agree with checking H&H q6h and transfuse for hemoglobin <7.0 -Hold Eliquis and all  NSAIDs -Advise colonoscopy for luminal evaluation and potentnial hemostasis. We discussed procedure details and indications in ED room today. She consents to proceed. -Clear liquid diet today. She will complete bowel prep 1600 today. -NPO after midnight -If she has signs of active bleeding, would advise NM GI Bleeding scan -Plan for colonoscopy tomorrow  I reviewed the risks (including bleeding, perforation, infection, anesthesia complications, cardiac/respiratory complications), benefits and alternatives of colonoscopy. Patient consents to proceed.     Thank you for the consult. Please call with questions or concerns.  [facility] For[facility] [phone] (Cell)

## 2020-01-19 NOTE — Progress Notes (Addendum)
    BRIEF OVERNIGHT PROGRESS REPORT  SUBJECTIVE: Rapid response initiated for hypertension and tachycardia following ambulation.  Patient was noted to be in A. fib with RVR heart rate in the upper 170s up to 180 bpm.  She was also hypotensive and noted to have large bloody stool.  Denies associated symptoms of shortness of breath, palpitation, chest pain, nausea or vomiting or dizziness.  She however endorses feeling lethargic.  OBJECTIVE: On arrival to the bedside, patient was sitting in a large pool of blood. She was afebrile with blood pressure 84/64 mm Hg and pulse rate 160 beats/min. There were no focal neurological deficits; she was alert and oriented x4.   ASSESSMENT AND PLAN:  AFib+RVR, Unstable (hypotensive) resolved with Amiodarone Bolus Patient with history of A. fib on anticoagulation currently being held for acute GI bleed. -Transfer to PCU  - Amiodarone 150mg  IV bolus administered with improvement, will consider starting Amiodarone gtt if persistent for rate control. - Cardiology consult  evaluation for procedure clearance. Patient is followed by [facility] Dr. Caryl Comes, Remo Lipps for persistent afib   Acute Lower GI Bleed Secondary to delayed post-polypectomy bleeding - IVF resuscitation to maintain MAP>65 - H&H monitoring q6h - Blood Consent.  Transfuse PRN Hgb<7 - NPO for pending Colonscopy for investigation and likely hemostasis of postpolyp sites in am - GI Consult for EGD/Colonoscopy - Continue Bowel prep  - Hold NSAIDs, steroids, ASA, Anticoagulation     Rufina Falco, BSN, MSN, DNP, CCRN,FNP-C  [facility] Nurse Practitioner  [facility]

## 2020-01-19 NOTE — Progress Notes (Signed)
   01/19/20 2155  Assess: MEWS Score  BP (!) 84/64  ECG Heart Rate (!) 160  Resp (!) 22  SpO2 98 %  Assess: MEWS Score  MEWS Temp 0  MEWS Systolic 1  MEWS Pulse 3  MEWS RR 1  MEWS LOC 0  MEWS Score 5  MEWS Score Color Red  Assess: if the MEWS score is Yellow or Red  Were vital signs taken at a resting state? No  Focused Assessment Change from prior assessment (see assessment flowsheet)  MEWS guidelines implemented *See Row Information* Yes  Treat  MEWS Interventions Escalated (See documentation below)  Pain Scale 0-10  Pain Score 0  Escalate  MEWS: Escalate Red: discuss with charge nurse/RN and provider, consider discussing with RRT  Notify: Charge Nurse/RN  Name of Charge Nurse/RN Notified Erika   Date Charge Nurse/RN Notified 01/19/20  Time Charge Nurse/RN Notified 2155  Notify: Provider  Provider Name/Title Rufina Falco, NP  Date Provider Notified 01/19/20  Time Provider Notified 2200  Notification Type Call  Notification Reason Change in status  Response See new orders  Date of Provider Response 01/19/20  Time of Provider Response 2205  Notify: Rapid Response  Name of Rapid Response RN Notified Harrell  Date Rapid Response Notified 01/19/20  Time Rapid Response Notified 2200  Document  Patient Outcome Transferred/level of care increased  Patient transferred to progressive care unit to monitor need for possible continuous drip

## 2020-01-19 NOTE — Progress Notes (Signed)
Ch arrived at room in response to RR page. RN reported that Pt was having heart rate concerns but was okay. No family around at this time. RN will page Ch if further support is needed.

## 2020-01-19 NOTE — Significant Event (Addendum)
Rapid Response Event Note   Reason for Call :  RR called for patient with hypotension and tachycardia after ambulating from bed and having large bloody BM.  Initial Focused Assessment:  Patient is alert and oriented, no complaints of pain, dizziness, or any distress.  EKG shows pt in afib RVR HR 160s-180s.  Initial BP 84/64 but increased to 112/65 shortly after seated in bed.    Interventions: EKG ordered per protocol.  Ouma, NP to bedside; orders for stat H&H, bmp, and mag. amio bolus ordered  Plan of Care: stayed in room to give amio bolus.  HR decreased from 170s to 100s after bolus given.    Event Summary:  Patient to transfer to PCU given history of afib.  Will follow up with patient once transferred.  MD Notified: 2157 Call Time: 2153 Arrival Time: 2155 End Time: 2240  Farris Has D, RN    FOLLOW UP:  Patient's HR remains controlled in the 90s. BP stable at 109/60.  Labs reviewed.  Continue to monitor and call if further assistance is needed.

## 2020-01-19 NOTE — ED Notes (Signed)
Pharmacy contacted to verify pt's digoxin.

## 2020-01-20 ENCOUNTER — Encounter: Payer: Self-pay | Admitting: Family Medicine

## 2020-01-20 ENCOUNTER — Inpatient Hospital Stay: Payer: Medicare Other | Admitting: Anesthesiology

## 2020-01-20 ENCOUNTER — Encounter
Admission: EM | Disposition: A | Discharge status: Home / Self Care | Payer: Self-pay | Source: Home / Self Care | Attending: Internal Medicine

## 2020-01-20 ENCOUNTER — Ambulatory Visit: Payer: Medicare Other | Admitting: Internal Medicine

## 2020-01-20 DIAGNOSIS — Z7901 Long term (current) use of anticoagulants: Secondary | ICD-10-CM

## 2020-01-20 DIAGNOSIS — I4821 Permanent atrial fibrillation: Secondary | ICD-10-CM

## 2020-01-20 DIAGNOSIS — K921 Melena: Secondary | ICD-10-CM

## 2020-01-20 HISTORY — PX: COLONOSCOPY: SHX5424

## 2020-01-20 LAB — BASIC METABOLIC PANEL
Anion gap: 7 (ref 5–15)
BUN: 10 mg/dL (ref 8–23)
CO2: 23 mmol/L (ref 22–32)
Calcium: 8.5 mg/dL — ABNORMAL LOW (ref 8.9–10.3)
Chloride: 109 mmol/L (ref 98–111)
Creatinine, Ser: 0.72 mg/dL (ref 0.44–1.00)
GFR calc Af Amer: >60 mL/min (ref >60)
GFR calc non Af Amer: >60 mL/min (ref >60)
Glucose, Bld: 123 mg/dL — ABNORMAL HIGH (ref 70–99)
Potassium: 3.8 mmol/L (ref 3.5–5.1)
Sodium: 139 mmol/L (ref 135–145)

## 2020-01-20 LAB — HEMOGLOBIN AND HEMATOCRIT, BLOOD
HCT: 25.4 % — ABNORMAL LOW (ref 36.0–46.0)
HCT: 24.4 % — ABNORMAL LOW (ref 36.0–46.0)
Hemoglobin: 9 g/dL — ABNORMAL LOW (ref 12.0–15.0)
Hemoglobin: 8.3 g/dL — ABNORMAL LOW (ref 12.0–15.0)

## 2020-01-20 LAB — GLUCOSE, CAPILLARY
Glucose-Capillary: 152 mg/dL — ABNORMAL HIGH (ref 70–99)
Glucose-Capillary: 118 mg/dL — ABNORMAL HIGH (ref 70–99)
Glucose-Capillary: 111 mg/dL — ABNORMAL HIGH (ref 70–99)
Glucose-Capillary: 127 mg/dL — ABNORMAL HIGH (ref 70–99)
Glucose-Capillary: 164 mg/dL — ABNORMAL HIGH (ref 70–99)

## 2020-01-20 LAB — MAGNESIUM: Magnesium: 2 mg/dL (ref 1.7–2.4)

## 2020-01-20 SURGERY — COLONOSCOPY
Anesthesia: General

## 2020-01-20 MED ORDER — LIDOCAINE HCL (CARDIAC) PF 100 MG/5ML IV SOSY
PREFILLED_SYRINGE | INTRAVENOUS | Status: DC | PRN
  Administered 2020-01-20: 100 mg via INTRAVENOUS

## 2020-01-20 MED ORDER — EPHEDRINE SULFATE 50 MG/ML IJ SOLN
INTRAMUSCULAR | Status: DC | PRN
  Administered 2020-01-20: 5 mg via INTRAVENOUS

## 2020-01-20 MED ORDER — PHENYLEPHRINE HCL (PRESSORS) 10 MG/ML IV SOLN
INTRAVENOUS | Status: DC | PRN
  Administered 2020-01-20 (×3): 100 ug via INTRAVENOUS

## 2020-01-20 MED ORDER — PROPOFOL 500 MG/50ML IV EMUL
INTRAVENOUS | Status: AC
  Filled 2020-01-20: qty 50

## 2020-01-20 MED ORDER — PROPOFOL 10 MG/ML IV BOLUS
INTRAVENOUS | Status: DC | PRN
  Administered 2020-01-20: 50 mg via INTRAVENOUS

## 2020-01-20 MED ORDER — PROPOFOL 500 MG/50ML IV EMUL
INTRAVENOUS | Status: DC | PRN
  Administered 2020-01-20: 155 ug/kg/min via INTRAVENOUS

## 2020-01-20 MED ORDER — GLYCOPYRROLATE 0.2 MG/ML IJ SOLN
INTRAMUSCULAR | Status: DC | PRN
  Administered 2020-01-20: .1 mg via INTRAVENOUS

## 2020-01-20 NOTE — Progress Notes (Signed)
Patient transferring to PCU as ordered, report called to receiving RN, Levada Dy

## 2020-01-20 NOTE — Interval H&P Note (Signed)
History and Physical Interval Note:  01/20/2020 3:27 PM  Brandy [name]  has presented today for surgery, with the diagnosis of Post-polypectomy bleed, hematochezia.  The various methods of treatment have been discussed with the patient and family. After consideration of risks, benefits and other options for treatment, the patient has consented to  Procedure(s): COLONOSCOPY (N/A) as a surgical intervention.  The patient's history has been reviewed, patient examined, no change in status, stable for surgery.  I have reviewed the patient's chart and labs.  Questions were answered to the patient's satisfaction.     Lesly Rubenstein  Ok to proceed with colonoscopy.

## 2020-01-20 NOTE — Progress Notes (Deleted)
Date:  01/20/2020   Name:  Brandy [name]   DOB:  [birth year]   MRN:  [id number]   Chief Complaint: No chief complaint on file.  Diabetes    Lab Results  Component Value Date   CREATININE 0.72 01/20/2020   BUN 10 01/20/2020   NA 139 01/20/2020   K 3.8 01/20/2020   CL 109 01/20/2020   CO2 23 01/20/2020   Lab Results  Component Value Date   CHOL 191 10/31/2016   HDL 41.30 10/31/2016   LDLDIRECT 131.0 10/31/2016   TRIG 216.0 (H) 10/31/2016   CHOLHDL 5 10/31/2016   Lab Results  Component Value Date   TSH 1.880 10/20/2019   Lab Results  Component Value Date   HGBA1C 6.5 (H) 01/19/2020   Lab Results  Component Value Date   WBC 9.9 01/18/2020   HGB 9.0 (L) 01/20/2020   HCT 25.4 (L) 01/20/2020   MCV 90.8 01/18/2020   PLT 284 01/18/2020   Lab Results  Component Value Date   ALT 28 01/18/2020   AST 27 01/18/2020   ALKPHOS 72 01/18/2020   BILITOT 0.7 01/18/2020     Review of Systems  Patient Active Problem List   Diagnosis Date Noted  . Acute lower GI bleeding 01/19/2020  . Atrial fibrillation, chronic ([place]) 01/19/2020  . Diabetes mellitus type 2 in obese ([place]) 01/19/2020  . Gastric polyp   . Stomach irritation   . Special screening for malignant neoplasms, colon   . Polyp of colon   . Nasal sinus congestion 10/20/2019  . Weight loss, non-intentional 10/20/2019  . Hyperglycemia 10/20/2019  . Varicose veins of both lower extremities 10/20/2019  . Dysphagia 10/20/2019  . Osteoarthritis of right hand 10/31/2016  . Persistent atrial fibrillation ([place])   . Nonischemic cardiomyopathy ([place]) 04/14/2016  . BMI 33.0-33.9,adult 04/14/2016    No Known Allergies  Past Surgical History:  Procedure Laterality Date  . BIOPSY N/A 01/12/2020   Procedure: BIOPSY;  Surgeon: Virgel Manifold, MD;  Location: [facility];  Service: Endoscopy;  Laterality: N/A;  . CARDIAC CATHETERIZATION N/A 04/17/2016   Procedure: Left Heart Cath and Coronary Angiography;   Surgeon: Wellington Hampshire, MD;  Location: [facility];  Service: Cardiovascular;  Laterality: N/A;  . CHOLECYSTECTOMY    . COLONOSCOPY WITH PROPOFOL N/A 01/12/2020   Procedure: COLONOSCOPY WITH PROPOFOL;  Surgeon: Virgel Manifold, MD;  Location: [facility];  Service: Endoscopy;  Laterality: N/A;  Diabetic - oral meds sleep apnea  . ELECTROPHYSIOLOGIC STUDY N/A 05/26/2016   Procedure: Cardioversion;  Surgeon: Wellington Hampshire, MD;  Location: [facility];  Service: Cardiovascular;  Laterality: N/A;  . ESOPHAGOGASTRODUODENOSCOPY (EGD) WITH PROPOFOL N/A 01/12/2020   Procedure: ESOPHAGOGASTRODUODENOSCOPY (EGD) WITH PROPOFOL;  Surgeon: Virgel Manifold, MD;  Location: [facility];  Service: Endoscopy;  Laterality: N/A;  . POLYPECTOMY N/A 01/12/2020   Procedure: POLYPECTOMY;  Surgeon: Virgel Manifold, MD;  Location: [facility];  Service: Endoscopy;  Laterality: N/A;    Social History   Tobacco Use  . Smoking status: Never Smoker  . Smokeless tobacco: Never Used  Vaping Use  . Vaping Use: Never used  Substance Use Topics  . Alcohol use: Not Currently  . Drug use: No     Medication list has been reviewed and updated.  No outpatient medications have been marked as taking for the 01/20/20 encounter (Appointment) with Glean Hess, MD.    [facility] 2/9 Scores 11/25/2019 10/24/2019 10/20/2019  PHQ - 2 Score 0 0 0  PHQ- 9 Score - 0 0    GAD 7 : Generalized Anxiety Score 10/24/2019 10/20/2019  Nervous, Anxious, on Edge 1 1  Control/stop worrying 0 0  Worry too much - different things 0 0  Trouble relaxing 0 0  Restless 0 0  Easily annoyed or irritable 0 0  Afraid - awful might happen 0 0  Total GAD 7 Score 1 1  Anxiety Difficulty Not difficult at all Not difficult at all    BP Readings from Last 3 Encounters:  01/20/20 116/65  01/12/20 (!) 106/54  12/15/19 100/70    Physical Exam  Wt Readings from Last 3 Encounters:  01/18/20 202 lb 13.2 oz  (92 kg)  01/12/20 203 lb (92.1 kg)  12/15/19 210 lb 1.6 oz (95.3 kg)    There were no vitals taken for this visit.  Assessment and Plan:

## 2020-01-20 NOTE — Progress Notes (Signed)
PROGRESS NOTE    Brandy [name]  NWG:[id number] DOB: October 26, 1955 DOA: 01/19/2020 PCP: Glean Hess, MD    Assessment & Plan:   Principal Problem:   Acute lower GI bleeding Active Problems:   BMI 33.0-33.9,adult   Atrial fibrillation, chronic ([name])   Diabetes mellitus type 2 in obese [facility])    Brandy [name] is a 64 y.o. Caucasian female with medical history significant for atrial fibrillation, diastolic CHF, diabetes mellitus, GERD, obesity, OSA presents to the hospital for evaluation of bright red blood per rectum.  She reports she had a colonoscopy and upper endoscopy a week ago by gastroenterology as an outpatient.  She had polyps biopsied.  She has been doing well at home until last night when she developed bright red blood per rectum.   Acute lower GI bleeding 2/2 delayed post-polypectomy bleeding --initial hemoglobin was 14.3 and repeat hemoglobin 12.8 after having multiple bowel movements of blood and blood clots during the night in the emergency room. --GI consulted PLAN: --H&H q8h and transfuse for Hgb <7 --Hold home Eliquis --colonoscopy today showed "Bleeding at the hepatic flexure secondary to previous polypectomy. Clips were placed." --continue MIVF for now at reduced 50 ml/hr --clear liquid diet today  Hx of combined systolic and diastolic heart failure, not currently active --Last TTE in Aug 2020 showed normal systolic fxn and undetermined diastolic fxn PLAN: --continue to hold home Lasix and metop due to low BP   Permanent Atrial fibrillation with RVR Chronic anticoagulation with Eliquis --went into Afib w RVR last night, s/p IV amio bolus.  Pt is on home digoxin and metop at home, however, metop was held on presentation due to low BP. PLAN: --Cardiology consult today, cleared pt for colonoscopy --continue home digoxin --check digoxin level --Hold home metop due to low BP --Hold home Eliquis due to bleeding --If pt went into RVR again, resume IV  amiodarine    Diabetes mellitus type 2 ([place]) Patient was diagnosed with diabetes mellitus a few months ago.  She reports her hemoglobin A1c was over 12 in June when she was diagnosised.  --A1c 6.5 during this admission. --SSI for now --follow up with outpatient doctor to recheck A1c in 3 months.  A1c >12 back in June 2021 may be false result.    BMI 33.0-33.9, adult Follow-up with PCP for dietary last intervention for weight loss.   DVT prophylaxis: SCD/Compression stockings Code Status: Full code  Family Communication:  Status is: inpatient Dispo:   The patient is from: home Anticipated d/c is to: home Anticipated d/c date is: tomorrow Patient currently is not medically stable to d/c due to: Need to ensure Hgb stable and no more lower GI bleeding.   Subjective and Interval History:  Pt went into Afib RVR last night, and hypotensive.  Rate controlled after IV amiodarone bolus.  Pt felt tired this morning.  No dyspnea or increased swelling.  Continued to have bloody BM's.    Went for colonoscopy today.   Objective: Vitals:   01/20/20 0500 01/20/20 0753 01/20/20 1125 01/20/20 1500  BP: (!) 108/59 116/65 (!) 125/48 (!) 128/45  Pulse: 91 88 89 92  Resp: 19 17 17 20   Temp:  (!) 97.5 F (36.4 C) 98.6 F (37 C) (!) 97 F (36.1 C)  TempSrc:  Oral Oral Temporal  SpO2: 96% 99% 100% 100%  Weight:      Height:        Intake/Output Summary (Last 24 hours) at 01/20/2020 1512 Last data  filed at 01/20/2020 0500 Gross per 24 hour  Intake 2222.16 ml  Output --  Net 2222.16 ml   Filed Weights   01/18/20 2151  Weight: 92 kg    Examination:   Constitutional: NAD, AAOx3 HEENT: conjunctivae and lids normal, EOMI CV: irregular. Distal pulses +2.  No cyanosis.   RESP: CTA B/L, normal respiratory effort  GI: +BS, NTND Extremities: No effusions, edema in BLE SKIN: warm, dry and intact Neuro: II - XII grossly intact.  Sensation intact Psych: Normal mood and affect.  Appropriate  judgement and reason   Data Reviewed: I have personally reviewed following labs and imaging studies  CBC: Recent Labs  Lab 01/18/20 2203 01/19/20 0511 01/19/20 1923 01/19/20 2214 01/20/20 0621  WBC 9.9  --   --   --   --   HGB 14.3 12.8 9.8* 10.3* 9.0*  HCT 41.4 37.1 29.2* 29.8* 25.4*  MCV 90.8  --   --   --   --   PLT 284  --   --   --   --    Basic Metabolic Panel: Recent Labs  Lab 01/18/20 2203 01/19/20 2217 01/20/20 0621  NA 141 137 139  K 4.0 3.9 3.8  CL 108 107 109  CO2 23 20* 23  GLUCOSE 170* 182* 123*  BUN 19 14 10   CREATININE 0.84 0.68 0.72  CALCIUM 9.7 8.5* 8.5*  MG  --  2.1 2.0   GFR: Estimated Creatinine Clearance: 81.7 mL/min (by C-G formula based on SCr of 0.72 mg/dL). Liver Function Tests: Recent Labs  Lab 01/18/20 2203  AST 27  ALT 28  ALKPHOS 72  BILITOT 0.7  PROT 8.0  ALBUMIN 4.3   No results for input(s): LIPASE, AMYLASE in the last 168 hours. No results for input(s): AMMONIA in the last 168 hours. Coagulation Profile: No results for input(s): INR, PROTIME in the last 168 hours. Cardiac Enzymes: No results for input(s): CKTOTAL, CKMB, CKMBINDEX, TROPONINI in the last 168 hours. BNP (last 3 results) No results for input(s): PROBNP in the last 8760 hours. HbA1C: Recent Labs    01/19/20 1129  HGBA1C 6.5*   CBG: Recent Labs  Lab 01/19/20 1755 01/19/20 2138 01/20/20 0754 01/20/20 1126 01/20/20 1457  GLUCAP 149* 189* 152* 118* 111*   Lipid Profile: No results for input(s): CHOL, HDL, LDLCALC, TRIG, CHOLHDL, LDLDIRECT in the last 72 hours. Thyroid Function Tests: No results for input(s): TSH, T4TOTAL, FREET4, T3FREE, THYROIDAB in the last 72 hours. Anemia Panel: No results for input(s): VITAMINB12, FOLATE, FERRITIN, TIBC, IRON, RETICCTPCT in the last 72 hours. Sepsis Labs: No results for input(s): PROCALCITON, LATICACIDVEN in the last 168 hours.  Recent Results (from the past 240 hour(s))  SARS Coronavirus 2 by RT PCR  (hospital order, performed in [facility] hospital lab) Nasopharyngeal Nasopharyngeal Swab     Status: None   Collection Time: 01/19/20  5:47 AM   Specimen: Nasopharyngeal Swab  Result Value Ref Range Status   SARS Coronavirus 2 NEGATIVE NEGATIVE Final    Comment: (NOTE) SARS-CoV-2 target nucleic acids are NOT DETECTED.  The SARS-CoV-2 RNA is generally detectable in upper and lower respiratory specimens during the acute phase of infection. The lowest concentration of SARS-CoV-2 viral copies this assay can detect is 250 copies / mL. A negative result does not preclude SARS-CoV-2 infection and should not be used as the sole basis for treatment or other patient management decisions.  A negative result may occur with improper specimen collection / handling,  submission of specimen other than nasopharyngeal swab, presence of viral mutation(s) within the areas targeted by this assay, and inadequate number of viral copies (<250 copies / mL). A negative result must be combined with clinical observations, patient history, and epidemiological information.  Fact Sheet for Patients:   StrictlyIdeas.no  Fact Sheet for Healthcare Providers: BankingDealers.co.za  This test is not yet approved or  cleared by the [place] FDA and has been authorized for detection and/or diagnosis of SARS-CoV-2 by FDA under an Emergency Use Authorization (EUA).  This EUA will remain in effect (meaning this test can be used) for the duration of the COVID-19 declaration under Section 564(b)(1) of the Act, 21 U.S.C. section 360bbb-3(b)(1), unless the authorization is terminated or revoked sooner.  Performed at [facility], [address] [place], [place] [address]       Radiology Studies: DG Abdomen Acute W/Chest  Result Date: 01/19/2020 CLINICAL DATA:  Rectal bleeding after colonoscopy, evaluate for free air EXAM: DG ABDOMEN ACUTE W/ 1V CHEST  COMPARISON:  None. FINDINGS: Lungs are clear.  No pleural effusion or pneumothorax. The heart is normal in size. Nonobstructive bowel gas pattern. No evidence of free air under the diaphragm on the upright view. Cholecystectomy clips. Degenerative changes of the lumbar spine. Visualized bony pelvis appears intact. IMPRESSION: No evidence of acute cardiopulmonary disease. No evidence of small bowel obstruction or free air. Electronically Signed   By: Julian Hy M.D.   On: 01/19/2020 06:17     Scheduled Meds: . [MAR Hold] digoxin  0.25 mg Oral Daily  . [MAR Hold] insulin aspart  0-15 Units Subcutaneous TID WC  . [MAR Hold] insulin aspart  0-5 Units Subcutaneous QHS  . [MAR Hold] mupirocin ointment  1 application Nasal BID   Continuous Infusions: . sodium chloride 100 mL/hr at 01/20/20 1038     LOS: 1 day     Enzo Bi, MD [facility] If 7PM-7AM, please contact night-coverage 01/20/2020, 3:12 PM

## 2020-01-20 NOTE — Anesthesia Procedure Notes (Signed)
Procedure Name: General with mask airway Performed by: Fletcher-Harrison, Alucard Fearnow, CRNA Pre-anesthesia Checklist: Patient identified, Emergency Drugs available, Suction available and Patient being monitored Patient Re-evaluated:Patient Re-evaluated prior to induction Oxygen Delivery Method: Simple face mask Induction Type: IV induction Placement Confirmation: positive ETCO2 and CO2 detector Dental Injury: Teeth and Oropharynx as per pre-operative assessment        

## 2020-01-20 NOTE — Anesthesia Postprocedure Evaluation (Signed)
Anesthesia Post Note  Patient: Brandy [name]  Procedure(s) Performed: COLONOSCOPY (N/A )  Patient location during evaluation: Endoscopy Anesthesia Type: General Level of consciousness: awake and alert Pain management: pain level controlled Vital Signs Assessment: post-procedure vital signs reviewed and stable Respiratory status: spontaneous breathing, nonlabored ventilation, respiratory function stable and patient connected to nasal cannula oxygen Cardiovascular status: blood pressure returned to baseline and stable Postop Assessment: no apparent nausea or vomiting Anesthetic complications: no   No complications documented.   Last Vitals:  Vitals:   01/20/20 1621 01/20/20 1637  BP: (!) 115/53 99/74  Pulse: (!) 108 81  Resp: (!) 23 17  Temp:  36.6 C  SpO2: 98% 95%    Last Pain:  Vitals:   01/20/20 1637  TempSrc: Oral  PainSc:                  Precious Haws Timmie Dugue

## 2020-01-20 NOTE — Consult Note (Signed)
Cardiology Consultation:   Patient ID: Brandy [name] MRN: [id number]; DOB: Mar 05, 1956  Admit date: 01/19/2020 Date of Consult: 01/20/2020  Primary Care Provider: Glean Hess, MD [facility] Cardiologist: Jolyn Nap, MD [facility] Electrophysiologist:  Richardson Landry   Patient Profile:   Brandy [name] is a 64 y.o. female with a hx of permanent atrial fibrillation, chronic systolic heart failure with normalization of LVEF due to nonischemic cardiomyopathy, and obstructive sleep apnea who is being seen today for the evaluation of atrial fibrillation at the request of Dr. Billie Ruddy.  History of Present Illness:   [name] underwent screening colonoscopy a week ago, during which polypectomy was performed.  Apixaban has been held for 2 days before the procedure and was restarted that night.  She did not have any bowel movements for several days.  2 days ago, she had a firm but otherwise normal-appearing bowel movement.  Shortly thereafter, she began having successive episodes of bright red blood per rectum, which have continued until today.  Other than feeling weak for the last 2 days, [name] has been in her usual state of health.  She denies chest pain, shortness of breath, and lightheadedness.  She also has not noticed any palpitations up until being hospitalized with the aforementioned rectal bleeding.  She typically takes digoxin and metoprolol at home for rate control, which she has been compliant with.  Overnight, [name] had rapid ventricular response for which she was given an amiodarone bolus.  She has been maintained on digoxin.  Metoprolol has been held in the setting of hypotension.  She is scheduled for repeat colonoscopy this afternoon.   Past Medical History:  Diagnosis Date  . Chicken pox   . Chronic systolic CHF (congestive heart failure) ([place])    a. 03/2016 Echo: Ef 15-20%, diff HK, ant AK;  b. 05/2016 Echo: Ef 30-35%, diff HK, mildly dil LA/RA.  . Diabetes mellitus without  complication ([place])   . Dysrhythmia   . GERD (gastroesophageal reflux disease)   . Heart murmur   . Hip discomfort    been going on for 40 years   . History of kidney stones   . Moderate mitral regurgitation    a. 03/2016 Echo: mod MR in setting of LV dysfxn.  . Motion sickness    car - back seat  . NICM (nonischemic cardiomyopathy) ([place])    a. 03/2016 Echo: EF 15-20%, diff HK, ant AK, mod MR, mod dil LA, mildly dil RA;  b. 04/2016 Cath: nl cors;  c. 05/2016 Echo: EF 30-35%, diff HK.  . Obesity   . Obstructive sleep apnea    compliant with CPAP  . Persistent atrial fibrillation ([place])    a. Dx 03/2016;  b. CHA2DS2VASc = 2-->Eliquis 73m BID;  b. 05/2016 Failed DCCV x 4.  . Sleep apnea   . Visit for monitoring Tikosyn therapy 07/24/2016    Past Surgical History:  Procedure Laterality Date  . BIOPSY N/A 01/12/2020   Procedure: BIOPSY;  Surgeon: TVirgel Manifold MD;  [facility] [facility]  Service: Endoscopy;  Laterality: N/A;  . CARDIAC CATHETERIZATION N/A 04/17/2016   Procedure: Left Heart Cath and Coronary Angiography;  Surgeon: MWellington Hampshire MD;  [place] [facility];  Service: Cardiovascular;  Laterality: N/A;  . CHOLECYSTECTOMY    . COLONOSCOPY WITH PROPOFOL N/A 01/12/2020   Procedure: COLONOSCOPY WITH PROPOFOL;  Surgeon: TVirgel Manifold MD;  [place] [facility]  Service: Endoscopy;  Laterality: N/A;  Diabetic -  oral meds sleep apnea  . ELECTROPHYSIOLOGIC STUDY N/A 05/26/2016   Procedure: Cardioversion;  Surgeon: Wellington Hampshire, MD;  Location: [facility];  Service: Cardiovascular;  Laterality: N/A;  . ESOPHAGOGASTRODUODENOSCOPY (EGD) WITH PROPOFOL N/A 01/12/2020   Procedure: ESOPHAGOGASTRODUODENOSCOPY (EGD) WITH PROPOFOL;  Surgeon: Virgel Manifold, MD;  Location: [place];  Service: Endoscopy;  Laterality: N/A;  . POLYPECTOMY N/A 01/12/2020   Procedure: POLYPECTOMY;  Surgeon: Virgel Manifold, MD;  Location: [place];  Service: Endoscopy;  Laterality: N/A;     Home Medications:  Prior to Admission medications   Medication Sig Start Date Ingram Onnen Date Taking? Authorizing Provider  apixaban (ELIQUIS) 5 MG TABS tablet Take 1 tablet (5 mg total) by mouth 2 (two) times daily. 12/16/19  Yes Deboraha Sprang, MD  blood glucose meter kit and supplies KIT Dispense based on patient and insurance preference. Use 1-2 times per day to test BS 10/24/19  Yes Glean Hess, MD  digoxin (LANOXIN) 0.25 MG tablet Take 1 tablet (0.25 mg total) by mouth daily. 12/24/19  Yes Deboraha Sprang, MD  furosemide (LASIX) 20 MG tablet Take 20 mg by mouth daily as needed for fluid or edema.   Yes [provider]  MAGNESIUM GLUCONATE PO Take 200 mg by mouth daily.    Yes [provider]  metFORMIN (GLUCOPHAGE-XR) 500 MG 24 hr tablet Take 1 tablet (500 mg total) by mouth 2 (two) times daily before a meal. 10/24/19  Yes Glean Hess, MD  metoprolol succinate (TOPROL-XL) 100 MG 24 hr tablet Take 1.5 tablets (150 mg) by mouth once daily. Take with or immediately following a meal. 04/25/19  Yes Deboraha Sprang, MD  zinc gluconate 50 MG tablet Take 50 mg by mouth daily.   Yes [provider]  Cholecalciferol (VITAMIN D3) 1000 units CAPS Take 1 capsule by mouth 2 (two) times daily.  Patient not taking: Reported on 01/19/2020    [provider]  CINNAMON PO Take 2 capsules by mouth daily.  Patient not taking: Reported on 01/19/2020    [provider]  CRANBERRY PO Take 1 capsule by mouth daily as needed. Soft gel  Patient not taking: Reported on 01/19/2020    [provider]  [place] 1 application topically daily as needed.  Patient not taking: Reported on 01/19/2020    [provider]  Lovell Sheehan OIL Take 1 application by mouth daily as needed.  Patient not taking: Reported on 01/19/2020    [provider]  Lemongrass Oil OIL Apply 1 application topically daily as  needed.  Patient not taking: Reported on 01/19/2020    [provider]  MELALEUCA [place] Apply 1 application topically daily as needed. Oil  Patient not taking: Reported on 01/19/2020    [provider]  Multiple Vitamins-Minerals (MULTIVITAMIN WITH MINERALS) tablet Take 1 tablet by mouth daily. Patient not taking: Reported on 01/19/2020    [provider]  OIL OF OREGANO PO Take 1 application by mouth daily. To left big toe    [provider]  peppermint oil liquid Apply 1 application topically daily as needed.  Patient not taking: Reported on 01/19/2020    [provider]  polyethylene glycol-electrolytes (NULYTELY) 420 g solution Prepare according to package instructions. Starting at 5:00 PM: Drink one 8 oz glass of mixture every 15 minutes until you finish half of the jug. Five hours prior to procedure, drink 8 oz glass of mixture every  15 minutes until it is all gone. Make sure you do not drink anything 4 hours prior to your procedure. Patient not taking: Reported on 01/19/2020 12/15/19   Virgel Manifold, MD  vitamin C (ASCORBIC ACID) 500 MG tablet Take 500 mg by mouth daily.  Patient not taking: Reported on 01/19/2020    [provider]    Inpatient Medications: Scheduled Meds: . digoxin  0.25 mg Oral Daily  . insulin aspart  0-15 Units Subcutaneous TID WC  . insulin aspart  0-5 Units Subcutaneous QHS  . mupirocin ointment  1 application Nasal BID   Continuous Infusions: . sodium chloride 100 mL/hr at 01/20/20 1038   PRN Meds: furosemide  Allergies:   No Known Allergies  Social History:   Social History   Tobacco Use  . Smoking status: Never Smoker  . Smokeless tobacco: Never Used  Vaping Use  . Vaping Use: Never used  Substance Use Topics  . Alcohol use: Not Currently  . Drug use: No    Family History:   Family History  Problem Relation Age of Onset  . COPD Mother   . COPD Father   . Heart disease Father   . Hypertension  Father   . Diabetes Paternal Aunt      ROS:  Please see the history of present illness. All other ROS reviewed and negative.     Physical Exam/Data:   Vitals:   01/20/20 0400 01/20/20 0500 01/20/20 0753 01/20/20 1125  BP: 98/60 (!) 108/59 116/65 (!) 125/48  Pulse: (!) 102 91 88 89  Resp: (!) '21 19 17 17  ' Temp:   (!) 97.5 F (36.4 C) 98.6 F (37 C)  TempSrc:   Oral Oral  SpO2: 96% 96% 99% 100%  Weight:      Height:        Intake/Output Summary (Last 24 hours) at 01/20/2020 1328 Last data filed at 01/20/2020 0500 Gross per 24 hour  Intake 2222.16 ml  Output --  Net 2222.16 ml   Last 3 Weights 01/18/2020 01/12/2020 01/01/2020  Weight (lbs) 202 lb 13.2 oz 203 lb 210 lb  Weight (kg) 92 kg 92.08 kg 95.255 kg     Body mass index is 31.77 kg/m.  General:  Well nourished, well developed, in no acute distress HEENT: normal Lymph: no adenopathy Neck: no JVD Endocrine:  No thryomegaly Vascular: No carotid bruits; FA pulses 2+ bilaterally without bruits  Cardiac: Tachycardic and irregularly irregular without murmurs, rubs, or gallops. Lungs:  clear to auscultation bilaterally, no wheezing, rhonchi or rales  Abd: soft, nontender, no hepatomegaly  Ext: no edema Musculoskeletal:  No deformities, BUE and BLE strength normal and equal Skin: warm and dry  Neuro:  CNs 2-12 intact, no focal abnormalities noted Psych:  Normal affect   EKG:  The EKG was personally reviewed and demonstrates: Atrial fibrillation with rapid ventricular response as well as anterolateral and inferior T wave inversions. Telemetry:  Telemetry was personally reviewed and demonstrates: Atrial fibrillation with ventricular rate of 90 to 185 bpm.  Most ventricular rates are in the 90 to 110 bpm range.  Relevant CV Studies: TTE (12/31/2018): 1. The left ventricle has normal systolic function with an ejection  fraction of 60-65%. The cavity size was normal. There is mildly increased  left ventricular wall thickness.  Left ventricular diastolic function could  not be evaluated secondary to atrial  fibrillation. No evidence of left ventricular regional wall motion  abnormalities.  2. The right ventricle has normal  systolic function. The cavity was  normal. There is no increase in right ventricular wall thickness. Right  ventricular systolic pressure is upper normal to mildly elevated (25-30  mmHg plus central venous pressure).  3. Right atrial size was mildly dilated.  4. The aortic valve is tricuspid.  5. The aorta is normal unless otherwise noted.  6. The interatrial septum was not well visualized.   Laboratory Data:  High Sensitivity Troponin:  No results for input(s): TROPONINIHS in the last 720 hours.   Chemistry Recent Labs  Lab 01/18/20 2203 01/19/20 2217 01/20/20 0621  NA 141 137 139  K 4.0 3.9 3.8  CL 108 107 109  CO2 23 20* 23  GLUCOSE 170* 182* 123*  BUN '19 14 10  ' CREATININE 0.84 0.68 0.72  CALCIUM 9.7 8.5* 8.5*  GFRNONAA >60 >60 >60  GFRAA >60 >60 >60  ANIONGAP '10 10 7    ' Recent Labs  Lab 01/18/20 2203  PROT 8.0  ALBUMIN 4.3  AST 27  ALT 28  ALKPHOS 72  BILITOT 0.7   Hematology Recent Labs  Lab 01/18/20 2203 01/19/20 0511 01/19/20 1923 01/19/20 2214 01/20/20 0621  WBC 9.9  --   --   --   --   RBC 4.56  --   --   --   --   HGB 14.3   < > 9.8* 10.3* 9.0*  HCT 41.4   < > 29.2* 29.8* 25.4*  MCV 90.8  --   --   --   --   MCH 31.4  --   --   --   --   MCHC 34.5  --   --   --   --   RDW 12.4  --   --   --   --   PLT 284  --   --   --   --    < > = values in this interval not displayed.   BNPNo results for input(s): BNP, PROBNP in the last 168 hours.  DDimer No results for input(s): DDIMER in the last 168 hours.   Radiology/Studies:  DG Abdomen Acute W/Chest  Result Date: 01/19/2020 CLINICAL DATA:  Rectal bleeding after colonoscopy, evaluate for free air EXAM: DG ABDOMEN ACUTE W/ 1V CHEST COMPARISON:  None. FINDINGS: Lungs are clear.  No pleural  effusion or pneumothorax. The heart is normal in size. Nonobstructive bowel gas pattern. No evidence of free air under the diaphragm on the upright view. Cholecystectomy clips. Degenerative changes of the lumbar spine. Visualized bony pelvis appears intact. IMPRESSION: No evidence of acute cardiopulmonary disease. No evidence of small bowel obstruction or free air. Electronically Signed   By: Julian Hy M.D.   On: 01/19/2020 06:17   Assessment and Plan:   Hematochezia: Patient with multiple episodes of bright red blood per rectum and declining hemoglobin in the setting of anticoagulation for permanent atrial fibrillation and colonoscopy with polypectomy just over a week ago.  Hold all anticoagulation.  Continue to monitor serial hemoglobins and transfuse for hemoglobin less than 8 or symptoms.  Agree with proceeding with colonoscopy as soon as possible.  Patient does not have any active cardiac symptoms that should prevent this emergent, low risk procedure.  Permanent atrial fibrillation: Patient typically rate controlled with metoprolol and digoxin.  Metoprolol currently on hold due to hypotension, which I agree with.  Continue digoxin 250 mcg daily.  Once blood pressure allows, recommend gentle reintroduction of metoprolol.  If patient develops rapid ventricular response,  short course of IV amiodarone for rate control would be reasonable.  Recommend deferring reinitiation of anticoagulation during this admission.  Risks and benefits of outpatient anticoagulation can be readdressed at follow-up.  Question if left atrial occlusion would be an answer down the road as well.  Check digoxin level with morning labs tomorrow.  For questions or updates, please contact [place] Please consult [email or web address] for contact info under [facility].  Signed, Nelva Bush, MD  01/20/2020 1:28 PM

## 2020-01-20 NOTE — Anesthesia Preprocedure Evaluation (Addendum)
Anesthesia Evaluation  Patient identified by MRN, date of birth, ID band Patient awake    Reviewed: Allergy & Precautions, NPO status , Patient's Chart, lab work & pertinent test results  History of Anesthesia Complications Negative for: history of anesthetic complications  Airway Mallampati: III       Dental   Pulmonary sleep apnea and Continuous Positive Airway Pressure Ventilation , neg COPD, Not current smoker,           Cardiovascular (-) hypertension+CHF  (-) Past MI + dysrhythmias Atrial Fibrillation      Neuro/Psych neg Seizures    GI/Hepatic Neg liver ROS, GERD  Medicated and Controlled,  Endo/Other  diabetes, Type 2, Oral Hypoglycemic Agents  Renal/GU negative Renal ROS     Musculoskeletal   Abdominal   Peds  Hematology   Anesthesia Other Findings   Reproductive/Obstetrics                            Anesthesia Physical Anesthesia Plan  ASA: III and emergent  Anesthesia Plan: General   Post-op Pain Management:    Induction: Intravenous  PONV Risk Score and Plan: 3 and Propofol infusion, TIVA and Treatment may vary due to age or medical condition  Airway Management Planned: Nasal Cannula  Additional Equipment:   Intra-op Plan:   Post-operative Plan:   Informed Consent: I have reviewed the patients History and Physical, chart, labs and discussed the procedure including the risks, benefits and alternatives for the proposed anesthesia with the patient or authorized representative who has indicated his/her understanding and acceptance.       Plan Discussed with:   Anesthesia Plan Comments:         Anesthesia Quick Evaluation

## 2020-01-20 NOTE — Op Note (Addendum)
[facility] Gastroenterology Patient Name: Annia Gomm Procedure Date: 01/20/2020 3:30 PM MRN: [id number] Account #: [id number] Date of Birth: 1955-06-20 Admit Type: Outpatient Age: 64 Room: [facility] ENDO ROOM 3 Gender: Female Note Status: Finalized Procedure:             Colonoscopy Indications:           Treatment of bleeding from polypectomy site Providers:             Andrey Farmer MD, MD Referring MD:          Halina Maidens, MD (Referring MD) Medicines:             Monitored Anesthesia Care Complications:         No immediate complications. Procedure:             Pre-Anesthesia Assessment:                        - Prior to the procedure, a History and Physical was                         performed, and patient medications and allergies were                         reviewed. The patient is competent. The risks and                         benefits of the procedure and the sedation options and                         risks were discussed with the patient. All questions                         were answered and informed consent was obtained.                         Patient identification and proposed procedure were                         verified by the physician, the nurse, the anesthetist                         and the technician in the endoscopy suite. Mental                         Status Examination: alert and oriented. Airway                         Examination: normal oropharyngeal airway and neck                         mobility. Respiratory Examination: clear to                         auscultation. CV Examination: normal. Prophylactic                         Antibiotics: The patient does not require prophylactic  antibiotics. Prior Anticoagulants: The patient has                         taken Eliquis (apixaban), last dose was 2 days prior                         to procedure. ASA Grade Assessment: III - A patient                          with severe systemic disease. After reviewing the                         risks and benefits, the patient was deemed in                         satisfactory condition to undergo the procedure. The                         anesthesia plan was to use monitored anesthesia care                         (MAC). Immediately prior to administration of                         medications, the patient was re-assessed for adequacy                         to receive sedatives. The heart rate, respiratory                         rate, oxygen saturations, blood pressure, adequacy of                         pulmonary ventilation, and response to care were                         monitored throughout the procedure. The physical                         status of the patient was re-assessed after the                         procedure.                        After obtaining informed consent, the colonoscope was                         passed under direct vision. Throughout the procedure,                         the patient's blood pressure, pulse, and oxygen                         saturations were monitored continuously. The                         Colonoscope was introduced through the anus and  advanced to the the cecum, identified by appendiceal                         orifice and ileocecal valve. The colonoscopy was                         technically difficult and complex due to inadequate                         bowel prep. The patient tolerated the procedure well.                         The quality of the bowel preparation was fair. Findings:      The perianal and digital rectal examinations were normal.      A large polyp was found in the ascending colon. The polyp was       multi-lobulated.      Stigmata of recent bleeding was seen at the hepatic flexure, secondary       to previous polypectomy procedure. For hemostasis, three hemostatic       clips were successfully  placed. There was no bleeding at the end of the       procedure.      A post polypectomy scar was found in the recto-sigmoid colon. The scar       tissue was healthy in appearance. Impression:            - Preparation of the colon was fair.                        - Bleeding at the hepatic flexure secondary to                         previous polypectomy. Clips were placed.                        - Post-polypectomy scar in the recto-sigmoid colon.                        - One large polyp in the ascending colon.                        - No specimens collected. Recommendation:        - Return patient to hospital ward for ongoing care.                        - Advance diet as tolerated. Would start clear liquids                         first.                        - Continue present medications. Would not restart                         anticoagulation at this time until no further episodes                         of bleeding. Procedure Code(s):     --- Professional ---  45382, Colonoscopy, flexible; with control of                         bleeding, any method Diagnosis Code(s):     --- Professional ---                        K91.840, Postprocedural hemorrhage of a digestive                         system organ or structure following a digestive system                         procedure                        Z98.890, Other specified postprocedural states                        K63.5, Polyp of colon CPT copyright 2019 American Medical Association. All rights reserved. The codes documented in this report are preliminary and upon coder review may  be revised to meet current compliance requirements. Andrey Farmer, MD Andrey Farmer MD, MD 01/20/2020 4:05:01 PM Number of Addenda: 0 Note Initiated On: 01/20/2020 3:30 PM Scope Withdrawal Time: 0 hours 13 minutes 41 seconds  Total Procedure Duration: 0 hours 19 minutes 21 seconds  Estimated Blood Loss:  Estimated blood  loss: none.      [facility]

## 2020-01-20 NOTE — Transfer of Care (Signed)
Immediate Anesthesia Transfer of Care Note  Patient: Brandy [name]  Procedure(s) Performed: COLONOSCOPY (N/A )  Patient Location: Endoscopy Unit  Anesthesia Type:General  Level of Consciousness: drowsy and patient cooperative  Airway & Oxygen Therapy: Patient Spontanous Breathing  Post-op Assessment: Report given to RN and Post -op Vital signs reviewed and stable  Post vital signs: Reviewed and stable  Last Vitals:  Vitals Value Taken Time  BP    Temp    Pulse 91 01/20/20 1602  Resp 20 01/20/20 1602  SpO2 98 % 01/20/20 1602  Vitals shown include unvalidated device data.  Last Pain:  Vitals:   01/20/20 1500  TempSrc: Temporal  PainSc: 0-No pain         Complications: No complications documented.

## 2020-01-20 NOTE — H&P (Signed)
Outpatient short stay form Pre-procedure 01/20/2020 3:24 PM Raylene Miyamoto MD, MPH  Primary Physician: Dr. Army Melia  Reason for visit:  Post-polypectomy bleeding  History of present illness:   64 y/o lady with colonoscopy on 8/30 where several polyps were removed. Started having post-procedural bleeding after resuming anticoagulation with drop in hemoglobin. Here for colonoscopy to locate site of bleeding. Last took blood thinner on Sunday night.    Current Facility-Administered Medications:  .  0.9 %  sodium chloride infusion, , Intravenous, Continuous, Chotiner, Yevonne Aline, MD, Last Rate: 100 mL/hr at 01/20/20 1038, New Bag at 01/20/20 1038 .  [MAR Hold] digoxin (LANOXIN) tablet 0.25 mg, 0.25 mg, Oral, Daily, Chotiner, Yevonne Aline, MD, 0.25 mg at 01/20/20 1050 .  [MAR Hold] furosemide (LASIX) tablet 20 mg, 20 mg, Oral, Daily PRN, Chotiner, Yevonne Aline, MD .  Doug Sou Hold] insulin aspart (novoLOG) injection 0-15 Units, 0-15 Units, Subcutaneous, TID WC, Enzo Bi, MD, 3 Units at 01/20/20 0813 .  [MAR Hold] insulin aspart (novoLOG) injection 0-5 Units, 0-5 Units, Subcutaneous, QHS, Enzo Bi, MD .  Doug Sou Hold] mupirocin ointment (BACTROBAN) 2 % 1 application, 1 application, Nasal, BID, Enzo Bi, MD, 1 application at 69/62/95 1045  Medications Prior to Admission  Medication Sig Dispense Refill Last Dose  . apixaban (ELIQUIS) 5 MG TABS tablet Take 1 tablet (5 mg total) by mouth 2 (two) times daily. 180 tablet 1 01/18/2020 at 1830  . blood glucose meter kit and supplies KIT Dispense based on patient and insurance preference. Use 1-2 times per day to test BS 1 each 0 01/18/2020 at Unknown time  . digoxin (LANOXIN) 0.25 MG tablet Take 1 tablet (0.25 mg total) by mouth daily. 30 tablet 11 01/20/2020 at 0730  . furosemide (LASIX) 20 MG tablet Take 20 mg by mouth daily as needed for fluid or edema.   prn at prn  . MAGNESIUM GLUCONATE PO Take 200 mg by mouth daily.    01/18/2020 at 1830  . metFORMIN (GLUCOPHAGE-XR)  500 MG 24 hr tablet Take 1 tablet (500 mg total) by mouth 2 (two) times daily before a meal. 60 tablet 2 01/18/2020 at 1830  . metoprolol succinate (TOPROL-XL) 100 MG 24 hr tablet Take 1.5 tablets (150 mg) by mouth once daily. Take with or immediately following a meal. 135 tablet 3 01/18/2020 at 0730  . zinc gluconate 50 MG tablet Take 50 mg by mouth daily.   Past Week at Unknown time  . Cholecalciferol (VITAMIN D3) 1000 units CAPS Take 1 capsule by mouth 2 (two) times daily.  (Patient not taking: Reported on 01/19/2020)   Not Taking at Unknown time  . CINNAMON PO Take 2 capsules by mouth daily.  (Patient not taking: Reported on 01/19/2020)   Not Taking at Unknown time  . CRANBERRY PO Take 1 capsule by mouth daily as needed. Soft gel  (Patient not taking: Reported on 01/19/2020)   Not Taking at Unknown time  . Lavender Oil OIL Apply 1 application topically daily as needed.  (Patient not taking: Reported on 01/19/2020)   Not Taking at Unknown time  . Lemon Oil OIL Take 1 application by mouth daily as needed.  (Patient not taking: Reported on 01/19/2020)   Not Taking at Unknown time  . Lemongrass Oil OIL Apply 1 application topically daily as needed.  (Patient not taking: Reported on 01/19/2020)   Not Taking at Unknown time  . MELALEUCA [place] Apply 1 application topically daily as needed. Oil  (Patient not taking: Reported  on 01/19/2020)   Not Taking at Unknown time  . Multiple Vitamins-Minerals (MULTIVITAMIN WITH MINERALS) tablet Take 1 tablet by mouth daily. (Patient not taking: Reported on 01/19/2020)   Not Taking at Unknown time  . OIL OF OREGANO PO Take 1 application by mouth daily. To left big toe     . peppermint oil liquid Apply 1 application topically daily as needed.  (Patient not taking: Reported on 01/19/2020)   Not Taking at Unknown time  . polyethylene glycol-electrolytes (NULYTELY) 420 g solution Prepare according to package instructions. Starting at 5:00 PM: Drink one 8 oz glass of mixture every 15 minutes until  you finish half of the jug. Five hours prior to procedure, drink 8 oz glass of mixture every 15 minutes until it is all gone. Make sure you do not drink anything 4 hours prior to your procedure. (Patient not taking: Reported on 01/19/2020) 4000 mL 0 Completed Course at Unknown time  . vitamin C (ASCORBIC ACID) 500 MG tablet Take 500 mg by mouth daily.  (Patient not taking: Reported on 01/19/2020)   Not Taking at Unknown time     No Known Allergies   Past Medical History:  Diagnosis Date  . Chicken pox   . Chronic systolic CHF (congestive heart failure) ([place])    a. 03/2016 Echo: Ef 15-20%, diff HK, ant AK;  b. 05/2016 Echo: Ef 30-35%, diff HK, mildly dil LA/RA.  . Diabetes mellitus without complication ([place])   . Dysrhythmia   . GERD (gastroesophageal reflux disease)   . Heart murmur   . Hip discomfort    been going on for 40 years   . History of kidney stones   . Moderate mitral regurgitation    a. 03/2016 Echo: mod MR in setting of LV dysfxn.  . Motion sickness    car - back seat  . NICM (nonischemic cardiomyopathy) ([employer])    a. 03/2016 Echo: EF 15-20%, diff HK, ant AK, mod MR, mod dil LA, mildly dil RA;  b. 04/2016 Cath: nl cors;  c. 05/2016 Echo: EF 30-35%, diff HK.  . Obesity   . Obstructive sleep apnea    compliant with CPAP  . Persistent atrial fibrillation ([place])    a. Dx 03/2016;  b. CHA2DS2VASc = 2-->Eliquis 64m BID;  b. 05/2016 Failed DCCV x 4.  . Sleep apnea   . Visit for monitoring Tikosyn therapy 07/24/2016    Review of systems:  Otherwise negative.    Physical Exam  Gen: Alert, oriented. Appears stated age.  HEENT: [place]/AT. PERRLA. Lungs: No respiratory distress Abd: soft, benign, no masses. BS+ Ext: No edema. Pulses 2+    Planned procedures: Proceed with colonoscopy. The patient understands the nature of the planned procedure, indications, risks, alternatives and potential complications including but not limited to bleeding, infection, perforation, damage to internal  organs and possible oversedation/side effects from anesthesia. The patient agrees and gives consent to proceed.  Please refer to procedure notes for findings, recommendations and patient disposition/instructions.     TRaylene MiyamotoMD, MPH Gastroenterology 01/20/2020  3:24 PM

## 2020-01-20 NOTE — Progress Notes (Signed)
Nutrition Brief Note  Patient identified on the Malnutrition Screening Tool (MST) Report  Met with pt bedside this afternoon. Patient endorses intentional weight loss after making healthy lifestyle changes s/p diabetes diagnosis a few months ago.  She is eating small frequent meals throughout the day, recalling fresh fruits and vegetables, whole grains, and lean meats, snacks on nuts and occasionally she has popcorn, drinks 3 bottles of water (20oz) daily and sun tea made with Stevia. Per labs A1c 12.0 on 10/20/19 significantly improved, noted 6.5 on 01/19/20.   Wt Readings from Last 15 Encounters:  01/18/20 92 kg  01/12/20 92.1 kg  12/15/19 95.3 kg  12/12/19 (!) 94.5 kg  12/08/19 (!) 96.6 kg  12/01/19 95.5 kg  11/25/19 95.9 kg  10/24/19 97.5 kg  10/20/19 98.4 kg  08/21/19 101.2 kg  04/25/19 105.5 kg  09/19/18 119.7 kg  05/21/18 120.2 kg  05/09/18 121.1 kg  01/23/18 121.3 kg    Body mass index is 31.77 kg/m. Patient meets criteria for overweight based on current BMI.   Patient is currently NPO for procedure. Labs and medications reviewed.   No nutrition interventions warranted at this time. If nutrition issues arise, please consult RD.   Lajuan Lines, RD, LDN Clinical Nutrition After Hours/Weekend Pager # in [place]

## 2020-01-21 ENCOUNTER — Encounter: Payer: Self-pay | Admitting: Gastroenterology

## 2020-01-21 DIAGNOSIS — I5042 Chronic combined systolic (congestive) and diastolic (congestive) heart failure: Secondary | ICD-10-CM

## 2020-01-21 DIAGNOSIS — I4891 Unspecified atrial fibrillation: Secondary | ICD-10-CM

## 2020-01-21 DIAGNOSIS — Z7901 Long term (current) use of anticoagulants: Secondary | ICD-10-CM

## 2020-01-21 LAB — VITAMIN B12: Vitamin B-12: 186 pg/mL (ref 180–914)

## 2020-01-21 LAB — HEMOGLOBIN AND HEMATOCRIT, BLOOD
HCT: 24.1 % — ABNORMAL LOW (ref 36.0–46.0)
HCT: 26.1 % — ABNORMAL LOW (ref 36.0–46.0)
Hemoglobin: 8.4 g/dL — ABNORMAL LOW (ref 12.0–15.0)
Hemoglobin: 9.2 g/dL — ABNORMAL LOW (ref 12.0–15.0)

## 2020-01-21 LAB — GLUCOSE, CAPILLARY
Glucose-Capillary: 128 mg/dL — ABNORMAL HIGH (ref 70–99)
Glucose-Capillary: 104 mg/dL — ABNORMAL HIGH (ref 70–99)
Glucose-Capillary: 109 mg/dL — ABNORMAL HIGH (ref 70–99)
Glucose-Capillary: 119 mg/dL — ABNORMAL HIGH (ref 70–99)

## 2020-01-21 LAB — RETICULOCYTES
Immature Retic Fract: 20.6 % — ABNORMAL HIGH (ref 2.3–15.9)
RBC.: 2.58 MIL/uL — ABNORMAL LOW (ref 3.87–5.11)
Retic Count, Absolute: 82 10*3/uL (ref 19.0–186.0)
Retic Ct Pct: 3.2 % — ABNORMAL HIGH (ref 0.4–3.1)

## 2020-01-21 LAB — BASIC METABOLIC PANEL
Anion gap: 7 (ref 5–15)
BUN: 5 mg/dL — ABNORMAL LOW (ref 8–23)
CO2: 25 mmol/L (ref 22–32)
Calcium: 8.4 mg/dL — ABNORMAL LOW (ref 8.9–10.3)
Chloride: 109 mmol/L (ref 98–111)
Creatinine, Ser: 0.6 mg/dL (ref 0.44–1.00)
GFR calc Af Amer: >60 mL/min (ref >60)
GFR calc non Af Amer: >60 mL/min (ref >60)
Glucose, Bld: 127 mg/dL — ABNORMAL HIGH (ref 70–99)
Potassium: 3.6 mmol/L (ref 3.5–5.1)
Sodium: 141 mmol/L (ref 135–145)

## 2020-01-21 LAB — IRON AND TIBC
Iron: 66 ug/dL (ref 28–170)
Saturation Ratios: 22 % (ref 10.4–31.8)
TIBC: 301 ug/dL (ref 250–450)
UIBC: 235 ug/dL

## 2020-01-21 LAB — DIGOXIN LEVEL: Digoxin Level: 0.8 ng/mL (ref 0.8–2.0)

## 2020-01-21 LAB — FERRITIN: Ferritin: 33 ng/mL (ref 11–307)

## 2020-01-21 LAB — FOLATE: Folate: 15.5 ng/mL (ref >5.9)

## 2020-01-21 LAB — MAGNESIUM: Magnesium: 2.2 mg/dL (ref 1.7–2.4)

## 2020-01-21 MED ORDER — VITAMIN D 25 MCG (1000 UNIT) PO TABS
1000.0000 [IU] | ORAL_TABLET | Freq: Two times a day (BID) | ORAL | Status: DC
  Administered 2020-01-21 – 2020-01-24 (×7): 1000 [IU] via ORAL
  Filled 2020-01-21 (×7): qty 1

## 2020-01-21 MED ORDER — METOPROLOL TARTRATE 25 MG PO TABS
12.5000 mg | ORAL_TABLET | Freq: Two times a day (BID) | ORAL | Status: DC
  Administered 2020-01-21 – 2020-01-23 (×5): 12.5 mg via ORAL
  Filled 2020-01-21 (×5): qty 1

## 2020-01-21 NOTE — Hospital Course (Signed)
Brandy [name] is a 64 y.o. Caucasian female with medical history significant for atrial fibrillation, diastolic CHF, diabetes mellitus, GERD, obesity, OSA presented to the ED on evening of 01/18/20 for evaluation of bright red blood per rectum in the setting of recent outpatient a colonoscopy with multiple polypectomies and EGD on 8/30.  Since the procedures, had been doing well at home until she developed bright red blood per rectum the night before presenting.  Patient takes Eliquis for stroke prevention given A-fib, it was held for 2 days prior to procedures and resume the night after colonoscopy was performed.     In the ED, patient hemodynamically stable with hemoglobin 12.8 (down from 14.3).  GI was consulted in the ED, recommended to hold Eliquis and admit for further evaluation and close monitoring.  Patient underwent repeat colonoscopy on 9/6 where bleeding was found at the hepatic flexure, hemostatic clips placed.

## 2020-01-21 NOTE — Progress Notes (Signed)
Pts resp and HR counted for a full minute and WDL. MEWS is green. I will continue to assess.

## 2020-01-21 NOTE — Progress Notes (Signed)
Physical Therapy Evaluation Patient Details Name: Brandy [name] MRN: [id number] DOB: 1955/11/23 Today's Date: 01/21/2020   History of Present Illness  Per MD note:64 y/o lady with colonoscopy on 8/30 where several polyps were removed. Started having post-procedural bleeding after resuming anticoagulation with drop in hemoglobin. Here for colonoscopy to locate site of bleeding. Last took blood thinner on Sunday night  Clinical Impression  Patient agrees to PT evaluation. Pt reports no pain. Pt lives alone with 5 steps into her home. Pt ambulated without AD prior to this hospital admission. Pt has WFL strength BLE hip and knee and is I for bed mobility, I for transfers sit to stand. Pt ambulates without AD 150 feet and no reports of pain. Pt has WNL static/dynamic standing balance. Pt has no skilled PT needs and no equipment needs.    Follow Up Recommendations No PT follow up    Equipment Recommendations  None recommended by PT    Recommendations for Other Services       Precautions / Restrictions Precautions Precautions: None Restrictions Weight Bearing Restrictions: No      Mobility  Bed Mobility Overal bed mobility: Independent                Transfers Overall transfer level: Independent               General transfer comment: no cues necessary  Ambulation/Gait Ambulation/Gait assistance: Independent Gait Distance (Feet): 150 Feet Assistive device: IV Pole Gait Pattern/deviations: Step-through pattern        Stairs            Wheelchair Mobility    Modified Rankin (Stroke Patients Only)       Balance Overall balance assessment: Independent                                           Pertinent Vitals/Pain Pain Assessment: No/denies pain    Home Living Family/patient expects to be discharged to:: Private residence Living Arrangements: Alone Available Help at Discharge: Friend(s) Type of Home: House Home Access: Stairs to  enter Entrance Stairs-Rails: Right Entrance Stairs-Number of Steps: 5   Home Equipment: None      Prior Function Level of Independence: Independent               Hand Dominance        Extremity/Trunk Assessment   Upper Extremity Assessment Upper Extremity Assessment: Overall WFL for tasks assessed    Lower Extremity Assessment Lower Extremity Assessment: Overall WFL for tasks assessed       Communication   Communication: No difficulties  Cognition Arousal/Alertness: Awake/alert Behavior During Therapy: WFL for tasks assessed/performed                                          General Comments      Exercises     Assessment/Plan    PT Assessment Patent does not need any further PT services  PT Problem List         PT Treatment Interventions      PT Goals (Current goals can be found in the Care Plan section)       Frequency     Barriers to discharge        Co-evaluation  AM-PAC PT "6 Clicks" Mobility  Outcome Measure Help needed turning from your back to your side while in a flat bed without using bedrails?: None Help needed moving from lying on your back to sitting on the side of a flat bed without using bedrails?: None Help needed moving to and from a bed to a chair (including a wheelchair)?: None Help needed standing up from a chair using your arms (e.g., wheelchair or bedside chair)?: None Help needed to walk in hospital room?: None Help needed climbing 3-5 steps with a railing? : None 6 Click Score: 24    End of Session Equipment Utilized During Treatment: Gait belt Activity Tolerance: Patient tolerated treatment well Patient left: in chair Nurse Communication: Mobility status      Time: 1545-1600 PT Time Calculation (min) (ACUTE ONLY): 15 min   Charges:   PT Evaluation $PT Eval Low Complexity: 1 Low           [place], Sherryl Barters, PT DPT 01/21/2020, 4:32 PM

## 2020-01-21 NOTE — Progress Notes (Signed)
Progress Note  Patient Name: Brandy [name] Date of Encounter: 01/21/2020  [facility] Cardiologist: Virl Axe, MD  Subjective   Patient feeling better compared to admission.  Had colonoscopy yesterday which showed bleeding from polypectomy site, hemostatic clips were successfully placed.  Inpatient Medications    Scheduled Meds: . digoxin  0.25 mg Oral Daily  . insulin aspart  0-15 Units Subcutaneous TID WC  . insulin aspart  0-5 Units Subcutaneous QHS  . metoprolol tartrate  12.5 mg Oral BID  . mupirocin ointment  1 application Nasal BID   Continuous Infusions: . sodium chloride 50 mL/hr at 01/20/20 2048   PRN Meds: furosemide   Vital Signs    Vitals:   01/20/20 2005 01/21/20 0555 01/21/20 0825 01/21/20 0830  BP: (!) 117/54 117/64 (!) 134/53   Pulse: 77 94    Resp: 20 20 (!) 27 17  Temp: 98.3 F (36.8 C) 97.7 F (36.5 C) 97.7 F (36.5 C)   TempSrc: Oral Oral Oral   SpO2: 97% 96% 99% 99%  Weight:      Height:        Intake/Output Summary (Last 24 hours) at 01/21/2020 1027 Last data filed at 01/21/2020 0554 Gross per 24 hour  Intake 976 ml  Output 500 ml  Net 476 ml   Last 3 Weights 01/18/2020 01/12/2020 01/01/2020  Weight (lbs) 202 lb 13.2 oz 203 lb 210 lb  Weight (kg) 92 kg 92.08 kg 95.255 kg      Telemetry    Atrial fibrillation heart rate 109-120- Personally Reviewed  ECG    No new tracing- Personally Reviewed  Physical Exam   GEN: No acute distress.   Neck: No JVD Cardiac:  Irregular irregular Respiratory: Clear to auscultation bilaterally. GI: Soft, nontender, non-distended  MS: No edema; No deformity. Neuro:  Nonfocal  Psych: Normal affect   Labs    High Sensitivity Troponin:  No results for input(s): TROPONINIHS in the last 720 hours.    Chemistry Recent Labs  Lab 01/18/20 2203 01/18/20 2203 01/19/20 2217 01/20/20 0621 01/21/20 0435  NA 141   < > 137 139 141  K 4.0   < > 3.9 3.8 3.6  CL 108   < > 107 109 109  CO2 23   < >  20* 23 25  GLUCOSE 170*   < > 182* 123* 127*  BUN 19   < > 14 10 5*  CREATININE 0.84   < > 0.68 0.72 0.60  CALCIUM 9.7   < > 8.5* 8.5* 8.4*  PROT 8.0  --   --   --   --   ALBUMIN 4.3  --   --   --   --   AST 27  --   --   --   --   ALT 28  --   --   --   --   ALKPHOS 72  --   --   --   --   BILITOT 0.7  --   --   --   --   GFRNONAA >60   < > >60 >60 >60  GFRAA >60   < > >60 >60 >60  ANIONGAP 10   < > 10 7 7    < > = values in this interval not displayed.     Hematology Recent Labs  Lab 01/18/20 2203 01/19/20 0511 01/20/20 0621 01/20/20 1906 01/21/20 0435  WBC 9.9  --   --   --   --  RBC 4.56  --   --   --   --   HGB 14.3   < > 9.0* 8.3* 8.4*  HCT 41.4   < > 25.4* 24.4* 24.1*  MCV 90.8  --   --   --   --   MCH 31.4  --   --   --   --   MCHC 34.5  --   --   --   --   RDW 12.4  --   --   --   --   PLT 284  --   --   --   --    < > = values in this interval not displayed.    BNPNo results for input(s): BNP, PROBNP in the last 168 hours.   DDimer No results for input(s): DDIMER in the last 168 hours.   Radiology    No results found.  Cardiac Studies   TTE (12/31/2018): 1. The left ventricle has normal systolic function with an ejection  fraction of 60-65%. The cavity size was normal. There is mildly increased  left ventricular wall thickness. Left ventricular diastolic function could  not be evaluated secondary to atrial  fibrillation. No evidence of left ventricular regional wall motion  abnormalities.  2. The right ventricle has normal systolic function. The cavity was  normal. There is no increase in right ventricular wall thickness. Right  ventricular systolic pressure is upper normal to mildly elevated (25-30  mmHg plus central venous pressure).  3. Right atrial size was mildly dilated.  4. The aortic valve is tricuspid.  5. The aorta is normal unless otherwise noted.  6. The interatrial septum was not well visualized.  Patient Profile     64  y.o. female with history of permanent A. fib on Eliquis, previous nonischemic cardiomyopathy last EF normal at 60 to 65%, OSA presenting with weakness, diagnosed with hematochezia being seen for A. fib RVR.  Assessment & Plan    1.  Permanent atrial fibrillation -Currently in atrial fibrillation -Heart rate elevated 117 to 120 bpm -Blood pressure better, start low-dose beta-blocker, Lopressor 12.5 mg twice daily. -Titrate Lopressor as blood pressure permits and for adequate heart rate control. -Anemia, hypotension also contributing to tachycardia. -Consider switch to Toprol XL upon discharge. -Continue PTA digoxin 0.25 mg daily. -Continue to hold Eliquis until okay given by gastroenterology.  2.  Hematochezia, recent polypectomy -Status post colonoscopy with hemostatic clips placed. -Monitor H&H -Follow GI recommendations.    Signed, Kate Sable, MD  01/21/2020, 10:27 AM

## 2020-01-21 NOTE — Progress Notes (Signed)
PROGRESS NOTE    DENECE SHEARER   KDT:[id number]  DOB: 03-26-1956  PCP: Glean Hess, MD    DOA: 01/19/2020 LOS: 2   Brief Narrative   Brandy [name] is a 64 y.o. Caucasian female with medical history significant for atrial fibrillation, diastolic CHF, diabetes mellitus, GERD, obesity, OSA presented to the ED on evening of 01/18/20 for evaluation of bright red blood per rectum in the setting of recent outpatient a colonoscopy with multiple polypectomies and EGD on 8/30.  Since the procedures, had been doing well at home until she developed bright red blood per rectum the night before presenting.  Patient takes Eliquis for stroke prevention given A-fib, it was held for 2 days prior to procedures and resume the night after colonoscopy was performed.     In the ED, patient hemodynamically stable with hemoglobin 12.8 (down from 14.3).  GI was consulted in the ED, recommended to hold Eliquis and admit for further evaluation and close monitoring.  Patient underwent repeat colonoscopy on 9/6 where bleeding was found at the hepatic flexure, hemostatic clips placed.     Assessment & Plan   Principal Problem:   Acute lower GI bleeding Active Problems:   BMI 33.0-33.9,adult   Atrial fibrillation with rapid ventricular response (HCC)   Colon polyps   Atrial fibrillation, chronic (HCC)   Diabetes mellitus type 2 in obese (HCC)   Chronic anticoagulation   Acute lower GI bleeding secondary to delayed post polypectomy bleeding -present on admission. Underwent colonoscopy on 9/7, showed bleeding at the hepatic flexure due to recent polypectomy, hemostasis clips were placed. Anemia panel was obtained, iron studies, folate normal, B12 pending. --GI is following --Trend H&H and transfuse for Hbg <7 --Hold Eliquis --Continue gentle maintenance IV fluid --Tolerating clear liquids, will advance to full liquids, possibly soft diet for dinner if tolerating  Generalized weakness -due to above.  Will  have PT evaluate to ensure safe discharge plans as patient does live alone.  Chronic combined systolic/diastolic CHF -not acutely decompensated.  Last echo in August 2020 showed normal EF and undetermined diastolic parameters. --Lasix and metoprolol are held due to low BP --Resume these once BP tolerates  Permanent A. Fib / A. fib with RVR -patient is on digoxin and metoprolol at home.  Metoprolol was held due to low BP and patient subsequently developed RVR.   Heart rates may remain elevated especially with any exertion.  Her anemia and hypotension are contributing to tachycardia. --Cardiology following --Continue home digoxin --Follow dig level --Eliquis on hold due to bleeding --Cardiology resumed low dose of Lopressor today with improved BP --Transition to Toprol-XL on discharge --If RVR recurs, resume IV amiodarone --Telemetry monitoring  Type 2 diabetes -recently diagnosed few months ago.  A1c in June was above 12, this admission A1c is 6.5%.  Cover with sliding scale NovoLog, titrate insulin as needed for goal of glucose 140-180 while inpatient.  Repeat A1c in 3 months with primary care.  Obesity: Body mass index is 31.77 kg/m.  Complicates overall care and prognosis.  Counseled on lifestyle modifications for weight loss and overall health.  DVT prophylaxis: Place and maintain sequential compression device Start: 01/20/20 1216 SCDs Start: 01/19/20 1118   Diet:  Diet Orders (From admission, onward)    Start     Ordered   01/20/20 1734  Diet clear liquid Room service appropriate? Yes; Fluid consistency: Thin  Diet effective now       Question Answer Comment  Room service appropriate?  Yes   Fluid consistency: Thin      01/20/20 1733            Code Status: Full Code    Subjective 01/21/20    Patient seen this morning at bedside.  Says she overall feels good but generally weak.  Has only been up on her feet to the sink and bedside commode.  Expresses some concern because  she lives alone.  She denies any blood in her stool.  No acute events reported.   Disposition Plan & Communication   Status is: Inpatient  Remains inpatient appropriate because:Inpatient level of care appropriate due to severity of illness, ongoing A. fib with RVR and declining hemoglobin.   Dispo: The patient is from: Home              Anticipated d/c is to: Home              Anticipated d/c date is: 2 days              Patient currently is not medically stable to d/c.   Family Communication: None at bedside during encounter, will attempt to call   Consults, Procedures, Significant Events   Consultants:   Cardiology  Gastroenterology  Procedures:   Colonoscopy on 9/7    Objective   Vitals:   01/20/20 2005 01/21/20 0555 01/21/20 0825 01/21/20 0830  BP: (!) 117/54 117/64 (!) 134/53   Pulse: 77 94    Resp: 20 20 (!) 27 17  Temp: 98.3 F (36.8 C) 97.7 F (36.5 C) 97.7 F (36.5 C)   TempSrc: Oral Oral Oral   SpO2: 97% 96% 99% 99%  Weight:      Height:        Intake/Output Summary (Last 24 hours) at 01/21/2020 0907 Last data filed at 01/21/2020 0554 Gross per 24 hour  Intake 976 ml  Output 500 ml  Net 476 ml   Filed Weights   01/18/20 2151  Weight: 92 kg    Physical Exam:  General exam: awake, alert, no acute distress Respiratory system: CTAB, no wheezes, rales or rhonchi, normal respiratory effort. Cardiovascular system: normal S1/S2, irregular rhythm, regular rate, no pedal edema.   Gastrointestinal system: soft, NT, ND, no HSM felt, +bowel sounds. Central nervous system: A&O x4. no gross focal neurologic deficits, normal speech Extremities: moves all, no cyanosis, normal tone Skin: dry, intact, normal temperature, normal color Psychiatry: normal mood, congruent affect, judgement and insight appear normal  Labs   Data Reviewed: I have personally reviewed following labs and imaging studies  CBC: Recent Labs  Lab 01/18/20 2203 01/19/20 0511  01/19/20 1923 01/19/20 2214 01/20/20 0621 01/20/20 1906 01/21/20 0435  WBC 9.9  --   --   --   --   --   --   HGB 14.3   < > 9.8* 10.3* 9.0* 8.3* 8.4*  HCT 41.4   < > 29.2* 29.8* 25.4* 24.4* 24.1*  MCV 90.8  --   --   --   --   --   --   PLT 284  --   --   --   --   --   --    < > = values in this interval not displayed.   Basic Metabolic Panel: Recent Labs  Lab 01/18/20 2203 01/19/20 2217 01/20/20 0621 01/21/20 0435  NA 141 137 139 141  K 4.0 3.9 3.8 3.6  CL 108 107 109 109  CO2 23 20* 23 25  GLUCOSE 170* 182* 123* 127*  BUN 19 14 10  5*  CREATININE 0.84 0.68 0.72 0.60  CALCIUM 9.7 8.5* 8.5* 8.4*  MG  --  2.1 2.0 2.2   GFR: Estimated Creatinine Clearance: 81.7 mL/min (by C-G formula based on SCr of 0.6 mg/dL). Liver Function Tests: Recent Labs  Lab 01/18/20 2203  AST 27  ALT 28  ALKPHOS 72  BILITOT 0.7  PROT 8.0  ALBUMIN 4.3   No results for input(s): LIPASE, AMYLASE in the last 168 hours. No results for input(s): AMMONIA in the last 168 hours. Coagulation Profile: No results for input(s): INR, PROTIME in the last 168 hours. Cardiac Enzymes: No results for input(s): CKTOTAL, CKMB, CKMBINDEX, TROPONINI in the last 168 hours. BNP (last 3 results) No results for input(s): PROBNP in the last 8760 hours. HbA1C: Recent Labs    01/19/20 1129  HGBA1C 6.5*   CBG: Recent Labs  Lab 01/20/20 1126 01/20/20 1457 01/20/20 1658 01/20/20 2205 01/21/20 0821  GLUCAP 118* 111* 127* 164* 128*   Lipid Profile: No results for input(s): CHOL, HDL, LDLCALC, TRIG, CHOLHDL, LDLDIRECT in the last 72 hours. Thyroid Function Tests: No results for input(s): TSH, T4TOTAL, FREET4, T3FREE, THYROIDAB in the last 72 hours. Anemia Panel: No results for input(s): VITAMINB12, FOLATE, FERRITIN, TIBC, IRON, RETICCTPCT in the last 72 hours. Sepsis Labs: No results for input(s): PROCALCITON, LATICACIDVEN in the last 168 hours.  Recent Results (from the past 240 hour(s))  SARS  Coronavirus 2 by RT PCR (hospital order, performed in [facility] hospital lab) Nasopharyngeal Nasopharyngeal Swab     Status: None   Collection Time: 01/19/20  5:47 AM   Specimen: Nasopharyngeal Swab  Result Value Ref Range Status   SARS Coronavirus 2 NEGATIVE NEGATIVE Final    Comment: (NOTE) SARS-CoV-2 target nucleic acids are NOT DETECTED.  The SARS-CoV-2 RNA is generally detectable in upper and lower respiratory specimens during the acute phase of infection. The lowest concentration of SARS-CoV-2 viral copies this assay can detect is 250 copies / mL. A negative result does not preclude SARS-CoV-2 infection and should not be used as the sole basis for treatment or other patient management decisions.  A negative result may occur with improper specimen collection / handling, submission of specimen other than nasopharyngeal swab, presence of viral mutation(s) within the areas targeted by this assay, and inadequate number of viral copies (<250 copies / mL). A negative result must be combined with clinical observations, patient history, and epidemiological information.  Fact Sheet for Patients:   StrictlyIdeas.no  Fact Sheet for Healthcare Providers: BankingDealers.co.za  This test is not yet approved or  cleared by the [place] FDA and has been authorized for detection and/or diagnosis of SARS-CoV-2 by FDA under an Emergency Use Authorization (EUA).  This EUA will remain in effect (meaning this test can be used) for the duration of the COVID-19 declaration under Section 564(b)(1) of the Act, 21 U.S.C. section 360bbb-3(b)(1), unless the authorization is terminated or revoked sooner.  Performed at [facility], [address], [place], [place] [address]       Imaging Studies   No results found.   Medications   Scheduled Meds: . digoxin  0.25 mg Oral Daily  . insulin aspart  0-15 Units Subcutaneous TID WC  .  insulin aspart  0-5 Units Subcutaneous QHS  . metoprolol tartrate  12.5 mg Oral BID  . mupirocin ointment  1 application Nasal BID   Continuous Infusions: . sodium chloride 50 mL/hr at  01/20/20 2048       LOS: 2 days    Time spent: 30 minutes    Ezekiel Slocumb, DO [facility]  01/21/2020, 9:07 AM    If 7PM-7AM, please contact night-coverage. How to contact the [facility] Attending or Consulting provider [place] or covering provider during after hours [place], for this patient?    1. Check the care team in [facility] and look for a) attending/consulting [facility] provider listed and b) the [facility] team listed 2. Log into [email or web address] and use [place]'s universal password to access. If you do not have the password, please contact the hospital operator. 3. Locate the [facility] provider you are looking for under [facility] and page to a number that you can be directly reached. 4. If you still have difficulty reaching the provider, please page the [facility] (Director on Call) for the Hospitalists listed on amion for assistance.

## 2020-01-22 DIAGNOSIS — E669 Obesity, unspecified: Secondary | ICD-10-CM

## 2020-01-22 DIAGNOSIS — E1169 Type 2 diabetes mellitus with other specified complication: Secondary | ICD-10-CM

## 2020-01-22 LAB — BASIC METABOLIC PANEL
Anion gap: 7 (ref 5–15)
BUN: <5 mg/dL — ABNORMAL LOW (ref 8–23)
CO2: 26 mmol/L (ref 22–32)
Calcium: 8.7 mg/dL — ABNORMAL LOW (ref 8.9–10.3)
Chloride: 108 mmol/L (ref 98–111)
Creatinine, Ser: 0.77 mg/dL (ref 0.44–1.00)
GFR calc Af Amer: >60 mL/min (ref >60)
GFR calc non Af Amer: >60 mL/min (ref >60)
Glucose, Bld: 141 mg/dL — ABNORMAL HIGH (ref 70–99)
Potassium: 3.5 mmol/L (ref 3.5–5.1)
Sodium: 141 mmol/L (ref 135–145)

## 2020-01-22 LAB — CBC
HCT: 26 % — ABNORMAL LOW (ref 36.0–46.0)
Hemoglobin: 8.8 g/dL — ABNORMAL LOW (ref 12.0–15.0)
MCH: 31.5 pg (ref 26.0–34.0)
MCHC: 33.8 g/dL (ref 30.0–36.0)
MCV: 93.2 fL (ref 80.0–100.0)
Platelets: 212 10*3/uL (ref 150–400)
RBC: 2.79 MIL/uL — ABNORMAL LOW (ref 3.87–5.11)
RDW: 13 % (ref 11.5–15.5)
WBC: 7.3 10*3/uL (ref 4.0–10.5)
nRBC: 0 % (ref 0.0–0.2)

## 2020-01-22 LAB — MAGNESIUM: Magnesium: 2.2 mg/dL (ref 1.7–2.4)

## 2020-01-22 LAB — GLUCOSE, CAPILLARY
Glucose-Capillary: 134 mg/dL — ABNORMAL HIGH (ref 70–99)
Glucose-Capillary: 142 mg/dL — ABNORMAL HIGH (ref 70–99)
Glucose-Capillary: 110 mg/dL — ABNORMAL HIGH (ref 70–99)
Glucose-Capillary: 109 mg/dL — ABNORMAL HIGH (ref 70–99)

## 2020-01-22 MED ORDER — MIDODRINE HCL 5 MG PO TABS
5.0000 mg | ORAL_TABLET | Freq: Two times a day (BID) | ORAL | Status: DC
  Administered 2020-01-22 – 2020-01-24 (×5): 5 mg via ORAL
  Filled 2020-01-22 (×5): qty 1

## 2020-01-22 NOTE — Progress Notes (Signed)
PROGRESS NOTE    Brandy [name]   AYT:[id number]  DOB: 05/05/1956  PCP: Glean Hess, MD    DOA: 01/19/2020 LOS: 3   Brief Narrative   Brandy [name] is a 64 y.o. Caucasian female with medical history significant for atrial fibrillation, diastolic CHF, diabetes mellitus, GERD, obesity, OSA presented to the ED on evening of 01/18/20 for evaluation of bright red blood per rectum in the setting of recent outpatient a colonoscopy with multiple polypectomies and EGD on 8/30.  Since the procedures, had been doing well at home until she developed bright red blood per rectum the night before presenting.  Patient takes Eliquis for stroke prevention given A-fib, it was held for 2 days prior to procedures and resume the night after colonoscopy was performed.     In the ED, patient hemodynamically stable with hemoglobin 12.8 (down from 14.3).  GI was consulted in the ED, recommended to hold Eliquis and admit for further evaluation and close monitoring.  Patient underwent repeat colonoscopy on 9/6 where bleeding was found at the hepatic flexure, hemostatic clips placed.     Assessment & Plan   Principal Problem:   Acute lower GI bleeding Active Problems:   BMI 33.0-33.9,adult   Atrial fibrillation with rapid ventricular response (HCC)   Colon polyps   Atrial fibrillation, chronic (HCC)   Diabetes mellitus type 2 in obese (HCC)   Chronic anticoagulation   Hypotension - initially was due to bleeding and acute anemia.  Bleeding has stopped, patient continues with intermittent hypotension, requiring rate control med to be held.  Started midodrine 5 mg BID WC today.  Monitor BP.  Maintain MAP>=65. Unclear exact etiology, but afebrile, no leukocytosis, and asymptomatic.  Acute lower GI bleeding secondary to delayed post polypectomy bleeding -present on admission. Underwent colonoscopy on 9/7, showed bleeding at the hepatic flexure due to recent polypectomy, hemostasis clips were placed. Anemia panel  was obtained, iron studies, folate normal, B12 pending. Hbg stable around 9 past couple days. --GI is following --Trend H&H and transfuse for Hbg <7 --Hold Eliquis --Tolerating diet, off IV fluids  Generalized weakness -due to above.  Will have PT evaluate to ensure safe discharge plans as patient does live alone.  Chronic combined systolic/diastolic CHF -not acutely decompensated.  Last echo in August 2020 showed normal EF and undetermined diastolic parameters. --Lasix and metoprolol are held due to low BP --Resume these once BP tolerates  Permanent A. Fib / A. fib with RVR -patient is on digoxin and metoprolol at home.  Metoprolol was held due to low BP and patient subsequently developed RVR.   Heart rates may remain elevated especially with any exertion.  Her anemia and hypotension are contributing to tachycardia.  Rate control currently limited by hypotension. --Cardiology following --Added lose dose midodrine for BP to tolerate beta blocker --Continue home digoxin --Follow dig level --Eliquis on hold due to bleeding --Low dose of Lopressor resumed 9/8, but unable to titrate up due to low BP (was on high dose metop at home) --Transition to Toprol-XL on discharge --If RVR recurs, resume IV amiodarone --Telemetry monitoring  Type 2 diabetes -recently diagnosed few months ago.  A1c in June was above 12, this admission A1c is 6.5%.  Cover with sliding scale NovoLog, titrate insulin as needed for goal of glucose 140-180 while inpatient.  Repeat A1c in 3 months with primary care.  Obesity: Body mass index is 31.77 kg/m.  Complicates overall care and prognosis.  Counseled on lifestyle modifications for  weight loss and overall health.  DVT prophylaxis: Place and maintain sequential compression device Start: 01/20/20 1216 SCDs Start: 01/19/20 1118   Diet:  Diet Orders (From admission, onward)    Start     Ordered   01/22/20 0813  Diet heart healthy/carb modified Room service  appropriate? Yes; Fluid consistency: Thin  Diet effective now       Question Answer Comment  Diet-HS Snack? Nothing   Room service appropriate? Yes   Fluid consistency: Thin      01/22/20 0812            Code Status: Full Code    Subjective 01/22/20    Patient seen up in chair this morning.  Reports that she feels well.  Tolerating diet without nausea or vomiting.  No bleeding seen.  Asks about going home.  We discussed her BP on the low side and continues to have HR's too high.  No chest pain, SOB or other complaints.  Does not feel dizzy or lightheaded.   Disposition Plan & Communication   Status is: Inpatient  Remains inpatient appropriate because:Inpatient level of care appropriate due to severity of illness, ongoing A. fib with RVR and hypotension.   Dispo: The patient is from: Home              Anticipated d/c is to: Home              Anticipated d/c date is: 1-2 days              Patient currently is not medically stable to d/c.   Family Communication: None at bedside during encounter, will attempt to call   Consults, Procedures, Significant Events   Consultants:   Cardiology  Gastroenterology  Procedures:   Colonoscopy on 9/7    Objective   Vitals:   01/21/20 2043 01/22/20 0611 01/22/20 0735 01/22/20 0804  BP: (!) 122/55 (!) 102/47 94/71   Pulse:    99  Resp: 20 11 17    Temp: 98.3 F (36.8 C)  98.1 F (36.7 C)   TempSrc: Oral  Oral   SpO2:      Weight:      Height:        Intake/Output Summary (Last 24 hours) at 01/22/2020 0847 Last data filed at 01/22/2020 0800 Gross per 24 hour  Intake 1486 ml  Output 0 ml  Net 1486 ml   Filed Weights   01/18/20 2151  Weight: 92 kg    Physical Exam:  General exam: awake, alert, no acute distress Respiratory system: CTAB, no wheezes, rales or rhonchi, normal respiratory effort. Cardiovascular system: normal S1/S2, irregular rhythm, regular rate, no pedal edema.   Gastrointestinal system: soft, NT,  ND, no HSM felt, +bowel sounds. Central nervous system: A&O x4. no gross focal neurologic deficits, normal speech Extremities: moves all, no cyanosis, normal tone Skin: dry, intact, normal temperature, normal color Psychiatry: normal mood, congruent affect, judgement and insight appear normal  Labs   Data Reviewed: I have personally reviewed following labs and imaging studies  CBC: Recent Labs  Lab 01/18/20 2203 01/19/20 0511 01/20/20 0621 01/20/20 1906 01/21/20 0435 01/21/20 1910 01/22/20 0542  WBC 9.9  --   --   --   --   --  7.3  HGB 14.3   < > 9.0* 8.3* 8.4* 9.2* 8.8*  HCT 41.4   < > 25.4* 24.4* 24.1* 26.1* 26.0*  MCV 90.8  --   --   --   --   --  93.2  PLT 284  --   --   --   --   --  212   < > = values in this interval not displayed.   Basic Metabolic Panel: Recent Labs  Lab 01/18/20 2203 01/19/20 2217 01/20/20 0621 01/21/20 0435 01/22/20 0542  NA 141 137 139 141 141  K 4.0 3.9 3.8 3.6 3.5  CL 108 107 109 109 108  CO2 23 20* 23 25 26   GLUCOSE 170* 182* 123* 127* 141*  BUN 19 14 10  5* <5*  CREATININE 0.84 0.68 0.72 0.60 0.77  CALCIUM 9.7 8.5* 8.5* 8.4* 8.7*  MG  --  2.1 2.0 2.2 2.2   GFR: Estimated Creatinine Clearance: 81.7 mL/min (by C-G formula based on SCr of 0.77 mg/dL). Liver Function Tests: Recent Labs  Lab 01/18/20 2203  AST 27  ALT 28  ALKPHOS 72  BILITOT 0.7  PROT 8.0  ALBUMIN 4.3   No results for input(s): LIPASE, AMYLASE in the last 168 hours. No results for input(s): AMMONIA in the last 168 hours. Coagulation Profile: No results for input(s): INR, PROTIME in the last 168 hours. Cardiac Enzymes: No results for input(s): CKTOTAL, CKMB, CKMBINDEX, TROPONINI in the last 168 hours. BNP (last 3 results) No results for input(s): PROBNP in the last 8760 hours. HbA1C: Recent Labs    01/19/20 1129  HGBA1C 6.5*   CBG: Recent Labs  Lab 01/21/20 0821 01/21/20 1151 01/21/20 1609 01/21/20 2206 01/22/20 0742  GLUCAP 128* 104* 109* 119*  134*   Lipid Profile: No results for input(s): CHOL, HDL, LDLCALC, TRIG, CHOLHDL, LDLDIRECT in the last 72 hours. Thyroid Function Tests: No results for input(s): TSH, T4TOTAL, FREET4, T3FREE, THYROIDAB in the last 72 hours. Anemia Panel: Recent Labs    01/21/20 0435  VITAMINB12 186  FOLATE 15.5  FERRITIN 33  TIBC 301  IRON 66  RETICCTPCT 3.2*   Sepsis Labs: No results for input(s): PROCALCITON, LATICACIDVEN in the last 168 hours.  Recent Results (from the past 240 hour(s))  SARS Coronavirus 2 by RT PCR (hospital order, performed in [facility] hospital lab) Nasopharyngeal Nasopharyngeal Swab     Status: None   Collection Time: 01/19/20  5:47 AM   Specimen: Nasopharyngeal Swab  Result Value Ref Range Status   SARS Coronavirus 2 NEGATIVE NEGATIVE Final    Comment: (NOTE) SARS-CoV-2 target nucleic acids are NOT DETECTED.  The SARS-CoV-2 RNA is generally detectable in upper and lower respiratory specimens during the acute phase of infection. The lowest concentration of SARS-CoV-2 viral copies this assay can detect is 250 copies / mL. A negative result does not preclude SARS-CoV-2 infection and should not be used as the sole basis for treatment or other patient management decisions.  A negative result may occur with improper specimen collection / handling, submission of specimen other than nasopharyngeal swab, presence of viral mutation(s) within the areas targeted by this assay, and inadequate number of viral copies (<250 copies / mL). A negative result must be combined with clinical observations, patient history, and epidemiological information.  Fact Sheet for Patients:   StrictlyIdeas.no  Fact Sheet for Healthcare Providers: BankingDealers.co.za  This test is not yet approved or  cleared by the [place] FDA and has been authorized for detection and/or diagnosis of SARS-CoV-2 by FDA under an Emergency Use Authorization  (EUA).  This EUA will remain in effect (meaning this test can be used) for the duration of the COVID-19 declaration under Section 564(b)(1) of the Act, 21 U.S.C. section  360bbb-3(b)(1), unless the authorization is terminated or revoked sooner.  Performed at [facility], [address] [place], [place] [address]       Imaging Studies   No results found.   Medications   Scheduled Meds: . cholecalciferol  1,000 Units Oral BID  . digoxin  0.25 mg Oral Daily  . insulin aspart  0-15 Units Subcutaneous TID WC  . insulin aspart  0-5 Units Subcutaneous QHS  . metoprolol tartrate  12.5 mg Oral BID  . midodrine  5 mg Oral BID WC  . mupirocin ointment  1 application Nasal BID   Continuous Infusions:      LOS: 3 days    Time spent: 25 minutes    Ezekiel Slocumb, DO [facility]  01/22/2020, 8:47 AM    If 7PM-7AM, please contact night-coverage. How to contact the [facility] Attending or Consulting provider [place] or covering provider during after hours [place], for this patient?    1. Check the care team in [facility] and look for a) attending/consulting [facility] provider listed and b) the [facility] team listed 2. Log into [email or web address] and use [place]'s universal password to access. If you do not have the password, please contact the hospital operator. 3. Locate the [facility] provider you are looking for under [facility] and page to a number that you can be directly reached. 4. If you still have difficulty reaching the provider, please page the [facility] (Director on Call) for the Hospitalists listed on amion for assistance.

## 2020-01-22 NOTE — Care Management Important Message (Signed)
Important Message  Patient Details  Name: Brandy [name] MRN: [id number] Date of Birth: 04-Mar-1956   Medicare Important Message Given:  Yes     Dannette Barbara 01/22/2020, 2:53 PM

## 2020-01-22 NOTE — Progress Notes (Signed)
Progress Note  Patient Name: Brandy [name] Date of Encounter: 01/22/2020  Primary Cardiologist: Dr. Caryl Comes, MD  Subjective   Denies current chest pain or shortness of breath.  Reports she feels her atrial fibrillation if rates high enough.    On review of telemetry, ventricular rates elevated into the 170s earlier this morning, which the patient states that she felt at that time.  Rates are currently well controlled wit pt asx.  Inpatient Medications    Scheduled Meds: . cholecalciferol  1,000 Units Oral BID  . digoxin  0.25 mg Oral Daily  . insulin aspart  0-15 Units Subcutaneous TID WC  . insulin aspart  0-5 Units Subcutaneous QHS  . metoprolol tartrate  12.5 mg Oral BID  . midodrine  5 mg Oral BID WC  . mupirocin ointment  1 application Nasal BID   Continuous Infusions:  PRN Meds: furosemide   Vital Signs    Vitals:   01/22/20 0735 01/22/20 0804 01/22/20 1005 01/22/20 1158  BP: 94/71  138/69 99/66  Pulse:  99  80  Resp: 17   18  Temp: 98.1 F (36.7 C)   97.9 F (36.6 C)  TempSrc: Oral   Oral  SpO2:    100%  Weight:      Height:        Intake/Output Summary (Last 24 hours) at 01/22/2020 1533 Last data filed at 01/22/2020 1410 Gross per 24 hour  Intake 1846 ml  Output 0 ml  Net 1846 ml   Last 3 Weights 01/18/2020 01/12/2020 01/01/2020  Weight (lbs) 202 lb 13.2 oz 203 lb 210 lb  Weight (kg) 92 kg 92.08 kg 95.255 kg      Telemetry    Atrial fibrillation with rates 170s to 70s- Personally Reviewed  ECG    No new tracings- Personally Reviewed  Physical Exam   GEN: No acute distress.  Lying in bed watching television Neck: No JVD Cardiac:  IR IR, no murmurs, rubs, or gallops.  Respiratory: Clear to auscultation bilaterally. GI: Soft, nontender, non-distended  MS: No edema with left leg larger than that of right and patient reporting that this is chronic; No deformity. Neuro:  Nonfocal  Psych: Normal affect   Labs    High Sensitivity Troponin:  No  results for input(s): TROPONINIHS in the last 720 hours.    Chemistry Recent Labs  Lab 01/18/20 2203 01/19/20 2217 01/20/20 0621 01/21/20 0435 01/22/20 0542  NA 141   < > 139 141 141  K 4.0   < > 3.8 3.6 3.5  CL 108   < > 109 109 108  CO2 23   < > 23 25 26   GLUCOSE 170*   < > 123* 127* 141*  BUN 19   < > 10 5* <5*  CREATININE 0.84   < > 0.72 0.60 0.77  CALCIUM 9.7   < > 8.5* 8.4* 8.7*  PROT 8.0  --   --   --   --   ALBUMIN 4.3  --   --   --   --   AST 27  --   --   --   --   ALT 28  --   --   --   --   ALKPHOS 72  --   --   --   --   BILITOT 0.7  --   --   --   --   GFRNONAA >60   < > >60 >60 >60  GFRAA >60   < > >60 >60 >60  ANIONGAP 10   < > 7 7 7    < > = values in this interval not displayed.     Hematology Recent Labs  Lab 01/18/20 2203 01/19/20 0511 01/21/20 0435 01/21/20 1910 01/22/20 0542  WBC 9.9  --   --   --  7.3  RBC 4.56  --  2.58*  --  2.79*  HGB 14.3   < > 8.4* 9.2* 8.8*  HCT 41.4   < > 24.1* 26.1* 26.0*  MCV 90.8  --   --   --  93.2  MCH 31.4  --   --   --  31.5  MCHC 34.5  --   --   --  33.8  RDW 12.4  --   --   --  13.0  PLT 284  --   --   --  212   < > = values in this interval not displayed.    BNPNo results for input(s): BNP, PROBNP in the last 168 hours.   DDimer No results for input(s): DDIMER in the last 168 hours.   Radiology    No results found.  Cardiac Studies   TTE (12/31/2018): 1. The left ventricle has normal systolic function with an ejection  fraction of 60-65%. The cavity size was normal. There is mildly increased  left ventricular wall thickness. Left ventricular diastolic function could  not be evaluated secondary to atrial  fibrillation. No evidence of left ventricular regional wall motion  abnormalities.  2. The right ventricle has normal systolic function. The cavity was  normal. There is no increase in right ventricular wall thickness. Right  ventricular systolic pressure is upper normal to mildly elevated  (25-30  mmHg plus central venous pressure).  3. Right atrial size was mildly dilated.  4. The aortic valve is tricuspid.  5. The aorta is normal unless otherwise noted.  6. The interatrial septum was not well visualized.  Patient Profile     64 y.o. female with history of permanent atrial fibrillation on Eliquis, previous nonischemic cardiomyopathy with last EF 60 to 65% 12/2018, OSA, and being evaluated today for atrial fibrillation with RVR in the setting of hematochezia/GI bleed.  Assessment & Plan    Permanent atrial fibrillation with RVR --Asymptomatic at this time.  Reports symptoms only when ventricular rates are elevated, such as earlier this morning. --Given soft BP, will need to monitor pressures and titrate Lopressor as BP allows for goal ventricular rates below 110.  Current rates well controlled in the 80s.  BP 110/54. If consistent SBP 90s, recommend reduce dose of Lopressor to avoid prerenal AKI. --Replete electrolytes. --Continue PTA digoxin 0.25 mg daily.  Renal function stable. --Anticoagulation on hold due to GI bleed.  Restart once safe to do so per GI.  HFpEF, NICM --Euvolemic on exam.  No shortness of breath currently.  Previous echo as above with EF 60 to 65%, mild LVH, RVSP mildly elevated 25 to 30 mmHg plus CVP and remainder of findings as above.  Continue as needed Lasix 20 mg as needed for lower extremity edema.  Hypokalemia --Potassium 3.5.  Replete with goal 4.0.  Magnesium at goal 2.2 with goal 2.0.  Hematochezia --S/p colonoscopy with hemostatic clips placed.  Continue to monitor H&H per CBC.  Further recommendations per GI.  OSA --CPAP recommended.  For questions or updates, please contact [place] Please consult [email or web address] for contact info under  Signed, Arvil Chaco, PA-C  01/22/2020, 3:33 PM

## 2020-01-23 LAB — CBC
HCT: 25.5 % — ABNORMAL LOW (ref 36.0–46.0)
Hemoglobin: 9 g/dL — ABNORMAL LOW (ref 12.0–15.0)
MCH: 31.7 pg (ref 26.0–34.0)
MCHC: 35.3 g/dL (ref 30.0–36.0)
MCV: 89.8 fL (ref 80.0–100.0)
Platelets: 232 10*3/uL (ref 150–400)
RBC: 2.84 MIL/uL — ABNORMAL LOW (ref 3.87–5.11)
RDW: 13.2 % (ref 11.5–15.5)
WBC: 7.1 10*3/uL (ref 4.0–10.5)
nRBC: 0 % (ref 0.0–0.2)

## 2020-01-23 LAB — BASIC METABOLIC PANEL
Anion gap: 7 (ref 5–15)
BUN: 8 mg/dL (ref 8–23)
CO2: 28 mmol/L (ref 22–32)
Calcium: 8.6 mg/dL — ABNORMAL LOW (ref 8.9–10.3)
Chloride: 107 mmol/L (ref 98–111)
Creatinine, Ser: 0.77 mg/dL (ref 0.44–1.00)
GFR calc Af Amer: >60 mL/min (ref >60)
GFR calc non Af Amer: >60 mL/min (ref >60)
Glucose, Bld: 136 mg/dL — ABNORMAL HIGH (ref 70–99)
Potassium: 3.5 mmol/L (ref 3.5–5.1)
Sodium: 142 mmol/L (ref 135–145)

## 2020-01-23 LAB — GLUCOSE, CAPILLARY
Glucose-Capillary: 128 mg/dL — ABNORMAL HIGH (ref 70–99)
Glucose-Capillary: 148 mg/dL — ABNORMAL HIGH (ref 70–99)
Glucose-Capillary: 104 mg/dL — ABNORMAL HIGH (ref 70–99)
Glucose-Capillary: 124 mg/dL — ABNORMAL HIGH (ref 70–99)

## 2020-01-23 LAB — DIGOXIN LEVEL: Digoxin Level: 0.7 ng/mL — ABNORMAL LOW (ref 0.8–2.0)

## 2020-01-23 MED ORDER — APIXABAN 5 MG PO TABS
5.0000 mg | ORAL_TABLET | Freq: Two times a day (BID) | ORAL | Status: DC
  Administered 2020-01-23 – 2020-01-24 (×2): 5 mg via ORAL
  Filled 2020-01-23 (×2): qty 1

## 2020-01-23 MED ORDER — METOPROLOL TARTRATE 25 MG PO TABS
25.0000 mg | ORAL_TABLET | Freq: Two times a day (BID) | ORAL | Status: DC
  Administered 2020-01-23 – 2020-01-24 (×2): 25 mg via ORAL
  Filled 2020-01-23 (×2): qty 1

## 2020-01-23 MED ORDER — POTASSIUM CHLORIDE CRYS ER 20 MEQ PO TBCR
40.0000 meq | EXTENDED_RELEASE_TABLET | Freq: Once | ORAL | Status: AC
  Administered 2020-01-23: 40 meq via ORAL
  Filled 2020-01-23: qty 2

## 2020-01-23 MED ORDER — METOPROLOL TARTRATE 25 MG PO TABS
12.5000 mg | ORAL_TABLET | Freq: Once | ORAL | Status: AC
  Administered 2020-01-23: 12.5 mg via ORAL
  Filled 2020-01-23: qty 1

## 2020-01-23 NOTE — Progress Notes (Signed)
Progress Note  Patient Name: Brandy [name] Date of Encounter: 01/23/2020  Primary Cardiologist: Jolyn Nap, MD  Subjective   No CP or SOB. Eating lunch. Slept well overnight.   Walked earlier in the day and did well with ventricular rates into the 120s max per patient.   Telemetry showed rates into the 160s earlier in the AM with pt confirming she did feel her heart racing earlier in the AM and at that time. She stated this was right before her medications, and since they have kicked in, she has been feeling fine with rates controlled.  Inpatient Medications    Scheduled Meds: . apixaban  5 mg Oral BID  . cholecalciferol  1,000 Units Oral BID  . digoxin  0.25 mg Oral Daily  . insulin aspart  0-15 Units Subcutaneous TID WC  . insulin aspart  0-5 Units Subcutaneous QHS  . metoprolol tartrate  25 mg Oral BID  . midodrine  5 mg Oral BID WC  . mupirocin ointment  1 application Nasal BID   Continuous Infusions:  PRN Meds: furosemide   Vital Signs    Vitals:   01/22/20 1551 01/22/20 1937 01/23/20 0352 01/23/20 0823  BP: (!) 110/54 123/64 (!) 108/91 117/63  Pulse: 87 98 74 82  Resp: 20   19  Temp: 98.5 F (36.9 C) 97.7 F (36.5 C) 98.2 F (36.8 C) 98.1 F (36.7 C)  TempSrc: Oral Oral Oral Oral  SpO2: 100% 100% 98% 97%  Weight:   91.6 kg   Height:        Intake/Output Summary (Last 24 hours) at 01/23/2020 1208 Last data filed at 01/23/2020 0126 Gross per 24 hour  Intake 240 ml  Output 0 ml  Net 240 ml   Last 3 Weights 01/23/2020 01/18/2020 01/12/2020  Weight (lbs) 202 lb 202 lb 13.2 oz 203 lb  Weight (kg) 91.627 kg 92 kg 92.08 kg      Telemetry    Atrial fibrillation with ventricular rates vary in the 70-80s and earlier episode of ventricular rates into the 170s- Personally Reviewed  ECG    No new tracings- Personally Reviewed  Physical Exam   GEN: No acute distress. Sitting in recliner next to bed and enjoying lunch. Neck: No JVD Cardiac: IRIR, no  murmurs, rubs, or gallops.  Respiratory: Clear to auscultation bilaterally. GI: Soft, nontender, non-distended  MS: No edema; No deformity. left leg larger than that of right and patient reporting that this is chronic Neuro:  Nonfocal  Psych: Normal affect   Labs    High Sensitivity Troponin:  No results for input(s): TROPONINIHS in the last 720 hours.    Chemistry Recent Labs  Lab 01/18/20 2203 01/19/20 2217 01/21/20 0435 01/22/20 0542 01/23/20 0557  NA 141   < > 141 141 142  K 4.0   < > 3.6 3.5 3.5  CL 108   < > 109 108 107  CO2 23   < > 25 26 28   GLUCOSE 170*   < > 127* 141* 136*  BUN 19   < > 5* <5* 8  CREATININE 0.84   < > 0.60 0.77 0.77  CALCIUM 9.7   < > 8.4* 8.7* 8.6*  PROT 8.0  --   --   --   --   ALBUMIN 4.3  --   --   --   --   AST 27  --   --   --   --   ALT 28  --   --   --   --  ALKPHOS 72  --   --   --   --   BILITOT 0.7  --   --   --   --   GFRNONAA >60   < > >60 >60 >60  GFRAA >60   < > >60 >60 >60  ANIONGAP 10   < > 7 7 7    < > = values in this interval not displayed.     Hematology Recent Labs  Lab 01/18/20 2203 01/19/20 0511 01/21/20 0435 01/21/20 0435 01/21/20 1910 01/22/20 0542 01/23/20 0557  WBC 9.9  --   --   --   --  7.3 7.1  RBC 4.56  --  2.58*  --   --  2.79* 2.84*  HGB 14.3   < > 8.4*   < > 9.2* 8.8* 9.0*  HCT 41.4   < > 24.1*   < > 26.1* 26.0* 25.5*  MCV 90.8  --   --   --   --  93.2 89.8  MCH 31.4  --   --   --   --  31.5 31.7  MCHC 34.5  --   --   --   --  33.8 35.3  RDW 12.4  --   --   --   --  13.0 13.2  PLT 284  --   --   --   --  212 232   < > = values in this interval not displayed.    BNPNo results for input(s): BNP, PROBNP in the last 168 hours.   DDimer No results for input(s): DDIMER in the last 168 hours.   Radiology    No results found.  Cardiac Studies   TTE (12/31/2018): 1. The left ventricle has normal systolic function with an ejection  fraction of 60-65%. The cavity size was normal. There is  mildly increased  left ventricular wall thickness. Left ventricular diastolic function could  not be evaluated secondary to atrial  fibrillation. No evidence of left ventricular regional wall motion  abnormalities.  2. The right ventricle has normal systolic function. The cavity was  normal. There is no increase in right ventricular wall thickness. Right  ventricular systolic pressure is upper normal to mildly elevated (25-30  mmHg plus central venous pressure).  3. Right atrial size was mildly dilated.  4. The aortic valve is tricuspid.  5. The aorta is normal unless otherwise noted.  6. The interatrial septum was not well visualized.  Patient Profile     64 y.o. female  with history of permanent atrial fibrillation on Eliquis, previous nonischemic cardiomyopathy with last EF 60 to 65% 12/2018, OSA, and being evaluated today for atrial fibrillation with RVR in the setting of hematochezia/GI bleed.  Assessment & Plan    Permanent atrial fibrillation with RVR --Asymptomatic with controlled rates. Sx earlier this morning with ventricular rates into the 160s at that time. Given soft BP, will need to monitor pressures and titrate Lopressor as BP allows for goal ventricular rates below 110.  Current rates well controlled in the 70-80s.  Ambulated earlier with rates max 120s. Replete electrolytes with potassium goal 4.0 magnesium goal 2.0. --Continue increased dose metoprolol tartrate 25 mg twice daily. Consider consolidation to Toprol-XL in the outpatient setting once rates are well controlled and appropriate dose of metoprolol established. --Continue digoxin 0.25 mg daily.  Renal function stable. --Restart Eliquis 5 mg twice daily GI and with first dose to occur at 1800 today. She will stay overnight while we monitor  her response with restart of anticoagulation and plan for a repeat CBC tomorrow morning.  HFpEF, NICM --Euvolemic on exam.  No shortness of breath currently.  Previous echo  as above with EF 60 to 65%, mild LVH, RVSP mildly elevated 25 to 30 mmHg plus CVP and remainder of findings as above.  Continue as needed Lasix 20 mg as needed for lower extremity edema.  Hypokalemia --K+ 3.5. Replete with goal 4.0. Magnesium goal 2.0..  Hematochezia --S/p colonoscopy with hemostatic clips placed.  Continue to monitor H&H per CBC, especially with restart of [place] as above. Further recommendations per GI.  OSA --CPAP recommended.  For questions or updates, please contact [place] Please consult [email or web address] for contact info under        Signed, Arvil Chaco, PA-C  01/23/2020, 12:08 PM

## 2020-01-23 NOTE — Progress Notes (Signed)
Pt ambulated around nurses station 4 times. No c/o of dizziness, chest pain or SOB. HR elevated to 127, then quickly recovered to 107 and sustaining. MD made aware.

## 2020-01-23 NOTE — Progress Notes (Addendum)
PROGRESS NOTE    Brandy [name]   KXF:[id number]  DOB: [birth year]  PCP: Glean Hess, MD    DOA: 01/19/2020 LOS: 4   Brief Narrative   Brandy [name] is a 64 y.o. Caucasian female with medical history significant for atrial fibrillation, diastolic CHF, diabetes mellitus, GERD, obesity, OSA presented to the ED on evening of 01/18/20 for evaluation of bright red blood per rectum in the setting of recent outpatient a colonoscopy with multiple polypectomies and EGD on 8/30.  Since the procedures, had been doing well at home until she developed bright red blood per rectum the night before presenting.  Patient takes Eliquis for stroke prevention given A-fib, it was held for 2 days prior to procedures and resume the night after colonoscopy was performed.     In the ED, patient hemodynamically stable with hemoglobin 12.8 (down from 14.3).  GI was consulted in the ED, recommended to hold Eliquis and admit for further evaluation and close monitoring.  Patient underwent repeat colonoscopy on 9/6 where bleeding was found at the hepatic flexure, hemostatic clips placed.     Assessment & Plan   Principal Problem:   Acute lower GI bleeding Active Problems:   BMI 33.0-33.9,adult   Atrial fibrillation with rapid ventricular response (HCC)   Colon polyps   Atrial fibrillation, chronic (HCC)   Diabetes mellitus type 2 in obese (HCC)   Chronic anticoagulation   Hypotension - Improved with midodrine.  Initially hypotension due to bleeding and acute anemia.  Bleeding has stopped, patient continues with intermittent hypotension, requiring rate control med to be held at times.  Unclear exact etiology, but afebrile, no leukocytosis, and asymptomatic so low suspicion for underlying infection.  Likely better diet control and less sodium intake here.   --Continue midodrine 5 mg BID WC  --Monitor BP.  Maintain MAP>=65. --Patient agrees to monitor BP at home and have close follow up   Acute lower GI bleeding  secondary to delayed post polypectomy bleeding -present on admission.  Resolved. Underwent colonoscopy on 9/7, showed bleeding at the hepatic flexure due to recent polypectomy, hemostasis clips were placed. Anemia panel was obtained, iron studies, folate normal, B12 pending. Hbg stable around 9 past several days. --GI consulted --Trend H&H and transfuse for Hbg <7 --Per GI, resume Eliquis this evening --Monitor overnight for any recurrent bleeding --CBC in AM   Permanent A. Fib / A. fib with RVR -patient is on digoxin and metoprolol at home.  Metoprolol was held due to low BP and patient subsequently developed RVR.   Heart rates may remain elevated especially with any exertion.  Her anemia and hypotension are contributing to tachycardia.  Rate control currently limited by hypotension. --Cardiology following --Added lose dose midodrine for BP to tolerate beta blocker --Continue home digoxin --Follow dig level --Eliquis on hold due to bleeding --Low dose of Lopressor resumed 9/8, but unable to titrate up due to low BP (was on high dose metop at home) --Transition to Toprol-XL on discharge --If RVR recurs, resume IV amiodarone --Telemetry monitoring   Chronic combined systolic/diastolic CHF -not acutely decompensated.  Last echo in August 2020 showed normal EF and undetermined diastolic parameters. --Lasix held due to low BP --Continue metoprolol --Resume these once BP tolerates   Generalized weakness -due to above.  PT evaluated, HH PT recommended.    Type 2 diabetes -recently diagnosed few months ago.  A1c in June was above 12, this admission A1c is 6.5%.  Cover with sliding scale NovoLog,  titrate insulin as needed for goal of glucose 140-180 while inpatient.  Repeat A1c in 3 months with primary care.  Obesity: Body mass index is 31.64 kg/m.  Complicates overall care and prognosis.  Counseled on lifestyle modifications for weight loss and overall health.   DVT prophylaxis: Place  and maintain sequential compression device Start: 01/20/20 1216 SCDs Start: 01/19/20 1118   Diet:  Diet Orders (From admission, onward)    Start     Ordered   01/22/20 0813  Diet heart healthy/carb modified Room service appropriate? Yes; Fluid consistency: Thin  Diet effective now       Question Answer Comment  Diet-HS Snack? Nothing   Room service appropriate? Yes   Fluid consistency: Thin      01/22/20 0812            Code Status: Full Code    Subjective 01/23/20    Patient seen up in chair this morning.  Says she feels well, no acute complaints.  We discussed BP monitoring at home, to which she is agreeable.  Does get rapid HR with ambulation, she reports that at home, she will commonly have HR 110's or so, but comes down quickly with rest.  With GI okay for resuming Eliquis, some worry about bleeding again.   Disposition Plan & Communication   Status is: Inpatient  Remains inpatient appropriate because:Inpatient level of care appropriate due to severity of illness, ongoing A. fib with RVR and hypotension.  Monitor for recurrence of bleeding with resumption of Eliquis this evening.   Dispo: The patient is from: Home              Anticipated d/c is to: Home              Anticipated d/c date is: 1 day              Patient currently is not medically stable to d/c.   Family Communication: None at bedside during encounter, will attempt to call   Consults, Procedures, Significant Events   Consultants:   Cardiology  Gastroenterology  Procedures:   Colonoscopy on 9/7    Objective   Vitals:   01/23/20 0352 01/23/20 0823 01/23/20 1219 01/23/20 1629  BP: (!) 108/91 117/63 (!) 101/54 115/69  Pulse: 74 82 82 79  Resp:  19 17 19   Temp: 98.2 F (36.8 C) 98.1 F (36.7 C) 98.2 F (36.8 C) 97.8 F (36.6 C)  TempSrc: Oral Oral Oral Oral  SpO2: 98% 97% 98% 100%  Weight: 91.6 kg     Height:        Intake/Output Summary (Last 24 hours) at 01/23/2020 1801 Last  data filed at 01/23/2020 0126 Gross per 24 hour  Intake --  Output 0 ml  Net 0 ml   Filed Weights   01/18/20 2151 01/23/20 0352  Weight: 92 kg 91.6 kg    Physical Exam:  General exam: awake, alert, no acute distress Respiratory system: CTAB, no wheezes, rales or rhonchi, normal respiratory effort. Cardiovascular system: normal S1/S2, irregular rhythm, regular rate, no pedal edema.   Extremities: moves all, no cyanosis, normal tone Psychiatry: normal mood, congruent affect, judgement and insight appear normal  Labs   Data Reviewed: I have personally reviewed following labs and imaging studies  CBC: Recent Labs  Lab 01/18/20 2203 01/19/20 0511 01/20/20 1906 01/21/20 0435 01/21/20 1910 01/22/20 0542 01/23/20 0557  WBC 9.9  --   --   --   --  7.3 7.1  HGB 14.3   < > 8.3* 8.4* 9.2* 8.8* 9.0*  HCT 41.4   < > 24.4* 24.1* 26.1* 26.0* 25.5*  MCV 90.8  --   --   --   --  93.2 89.8  PLT 284  --   --   --   --  212 232   < > = values in this interval not displayed.   Basic Metabolic Panel: Recent Labs  Lab 01/19/20 2217 01/20/20 0621 01/21/20 0435 01/22/20 0542 01/23/20 0557  NA 137 139 141 141 142  K 3.9 3.8 3.6 3.5 3.5  CL 107 109 109 108 107  CO2 20* 23 25 26 28   GLUCOSE 182* 123* 127* 141* 136*  BUN 14 10 5* <5* 8  CREATININE 0.68 0.72 0.60 0.77 0.77  CALCIUM 8.5* 8.5* 8.4* 8.7* 8.6*  MG 2.1 2.0 2.2 2.2  --    GFR: Estimated Creatinine Clearance: 81.5 mL/min (by C-G formula based on SCr of 0.77 mg/dL). Liver Function Tests: Recent Labs  Lab 01/18/20 2203  AST 27  ALT 28  ALKPHOS 72  BILITOT 0.7  PROT 8.0  ALBUMIN 4.3   No results for input(s): LIPASE, AMYLASE in the last 168 hours. No results for input(s): AMMONIA in the last 168 hours. Coagulation Profile: No results for input(s): INR, PROTIME in the last 168 hours. Cardiac Enzymes: No results for input(s): CKTOTAL, CKMB, CKMBINDEX, TROPONINI in the last 168 hours. BNP (last 3 results) No results  for input(s): PROBNP in the last 8760 hours. HbA1C: No results for input(s): HGBA1C in the last 72 hours. CBG: Recent Labs  Lab 01/22/20 1625 01/22/20 2110 01/23/20 0821 01/23/20 1220 01/23/20 1629  GLUCAP 110* 109* 128* 148* 104*   Lipid Profile: No results for input(s): CHOL, HDL, LDLCALC, TRIG, CHOLHDL, LDLDIRECT in the last 72 hours. Thyroid Function Tests: No results for input(s): TSH, T4TOTAL, FREET4, T3FREE, THYROIDAB in the last 72 hours. Anemia Panel: Recent Labs    01/21/20 0435  VITAMINB12 186  FOLATE 15.5  FERRITIN 33  TIBC 301  IRON 66  RETICCTPCT 3.2*   Sepsis Labs: No results for input(s): PROCALCITON, LATICACIDVEN in the last 168 hours.  Recent Results (from the past 240 hour(s))  SARS Coronavirus 2 by RT PCR (hospital order, performed in [facility] hospital lab) Nasopharyngeal Nasopharyngeal Swab     Status: None   Collection Time: 01/19/20  5:47 AM   Specimen: Nasopharyngeal Swab  Result Value Ref Range Status   SARS Coronavirus 2 NEGATIVE NEGATIVE Final    Comment: (NOTE) SARS-CoV-2 target nucleic acids are NOT DETECTED.  The SARS-CoV-2 RNA is generally detectable in upper and lower respiratory specimens during the acute phase of infection. The lowest concentration of SARS-CoV-2 viral copies this assay can detect is 250 copies / mL. A negative result does not preclude SARS-CoV-2 infection and should not be used as the sole basis for treatment or other patient management decisions.  A negative result may occur with improper specimen collection / handling, submission of specimen other than nasopharyngeal swab, presence of viral mutation(s) within the areas targeted by this assay, and inadequate number of viral copies (<250 copies / mL). A negative result must be combined with clinical observations, patient history, and epidemiological information.  Fact Sheet for Patients:   StrictlyIdeas.no  Fact Sheet for Healthcare  Providers: BankingDealers.co.za  This test is not yet approved or  cleared by the [place] FDA and has been authorized for detection and/or diagnosis of SARS-CoV-2 by FDA  under an Emergency Use Authorization (EUA).  This EUA will remain in effect (meaning this test can be used) for the duration of the COVID-19 declaration under Section 564(b)(1) of the Act, 21 U.S.C. section 360bbb-3(b)(1), unless the authorization is terminated or revoked sooner.  Performed at [facility], [address], [place], [place] [address]       Imaging Studies   No results found.   Medications   Scheduled Meds: . apixaban  5 mg Oral BID  . cholecalciferol  1,000 Units Oral BID  . digoxin  0.25 mg Oral Daily  . insulin aspart  0-15 Units Subcutaneous TID WC  . insulin aspart  0-5 Units Subcutaneous QHS  . metoprolol tartrate  25 mg Oral BID  . midodrine  5 mg Oral BID WC  . mupirocin ointment  1 application Nasal BID   Continuous Infusions:      LOS: 4 days    Time spent: 25 minutes with > 50% spent in coordination of care and direct patient contact.    Ezekiel Slocumb, DO [facility]  01/23/2020, 6:01 PM    If 7PM-7AM, please contact night-coverage. How to contact the [facility] Attending or Consulting provider [place] or covering provider during after hours [place], for this patient?    1. Check the care team in [facility] and look for a) attending/consulting TRH provider listed and b) the [facility] team listed 2. Log into [email or web address] and use [place]'s universal password to access. If you do not have the password, please contact the hospital operator. 3. Locate the [facility] provider you are looking for under [facility] and page to a number that you can be directly reached. 4. If you still have difficulty reaching the provider, please page the [facility] (Director on Call) for the Hospitalists listed on amion for assistance.

## 2020-01-23 NOTE — Progress Notes (Signed)
Physical Therapy Treatment Patient Details Name: Brandy [name] MRN: [id number] DOB: [birth year] Today's Date: 01/23/2020    History of Present Illness Per MD note:64 y/o lady with colonoscopy on 8/30 where several polyps were removed. Started having post-procedural bleeding after resuming anticoagulation with drop in hemoglobin. Here for colonoscopy to locate site of bleeding. Last took blood thinner on Sunday night    PT Comments    On arrival to room, patient was lying in bed with elevated head. Closely monitored vitals throughout session due to HR ranging from 110-157 seated with exercises and standing and RR around 27 standing. Did not ambulate due to sporadic vitals. Performed some seated therapeutic exercises with manual resistance and frequent rest breaks in between exercises. Patient ambulated to recliner chair where we left her with call bell. Pt making progress toward goals. Pt continues to benefit from skilled PT services to maximize return to PLOF and minimize risk of future falls, injury, and readmission. Will continue to follow POC. Discharge recommendation of Home Health remains appropriate.   Follow Up Recommendations  Home health PT     Equipment Recommendations  None recommended by PT    Recommendations for Other Services       Precautions / Restrictions Precautions Precautions: None Restrictions Weight Bearing Restrictions: No    Mobility  Bed Mobility Overal bed mobility: Modified Independent             General bed mobility comments: Uses bedrail and increased time to come into sitting.  Transfers Overall transfer level: Independent Equipment used: None Transfers: Sit to/from Stand Sit to Stand: Min guard         General transfer comment: no cues necessary, but CGA due to minimal reports for dizziness  Ambulation/Gait Ambulation/Gait assistance: Min guard (Took 2 steps to get from the bed to the chair) Gait Distance (Feet): 2 Feet Assistive  device: None       General Gait Details: Did not ambulate more than 2 feet due to fluctuating HR and RR   Stairs             Wheelchair Mobility    Modified Rankin (Stroke Patients Only)       Balance Overall balance assessment: Independent Sitting-balance support: Feet supported Sitting balance-Leahy Scale: Good Sitting balance - Comments: no overt LOB with unsupported back   Standing balance support: No upper extremity supported Standing balance-Leahy Scale: Good Standing balance comment: Able to stand with good balance, wide base of support                            Cognition Arousal/Alertness: Awake/alert Behavior During Therapy: WFL for tasks assessed/performed Overall Cognitive Status: Within Functional Limits for tasks assessed                                        Exercises Other Exercises Other Exercises: Pt performed all exercises sitting on edge of bed, alternating sitting marches x 15 each LE, LAQ x 15 with 3 sec holds, Clam shells with manual resistance x 15, Hip adductions c fist squeezes x 15, plantarflexion with manual resistance x 15 with legs straight, dorsiflexion with manual resistance x 15 with legs straight. Verbal cues utilized for proper positioning and to perform exercises slower.    General Comments        Pertinent Vitals/Pain Pain Assessment: No/denies pain  Home Living                      Prior Function            PT Goals (current goals can now be found in the care plan section) Acute Rehab PT Goals Patient Stated Goal: to go home PT Goal Formulation: With patient Time For Goal Achievement: 02/04/20 Potential to Achieve Goals: Good Progress towards PT goals: Progressing toward goals    Frequency    Min 2X/week      PT Plan Current plan remains appropriate    Co-evaluation              AM-PAC PT "6 Clicks" Mobility   Outcome Measure  Help needed turning from your  back to your side while in a flat bed without using bedrails?: None Help needed moving from lying on your back to sitting on the side of a flat bed without using bedrails?: None Help needed moving to and from a bed to a chair (including a wheelchair)?: A Little Help needed standing up from a chair using your arms (e.g., wheelchair or bedside chair)?: None Help needed to walk in hospital room?: A Little Help needed climbing 3-5 steps with a railing? : A Little 6 Click Score: 21    End of Session Equipment Utilized During Treatment: Gait belt Activity Tolerance: Patient tolerated treatment well Patient left: in chair   PT Visit Diagnosis: Unsteadiness on feet (R26.81);Muscle weakness (generalized) (M62.81);Dizziness and giddiness (R42)     Time: 8828-0034 PT Time Calculation (min) (ACUTE ONLY): 25 min  Charges:                        Noemi Chapel, SPT Bernita Raisin 01/23/2020, 12:17 PM

## 2020-01-23 NOTE — Progress Notes (Signed)
01/21/20 1035  PT Visit Information  Last PT Received On 01/21/20  Assistance Needed +1  History of Present Illness Per MD note:64 y/o lady with colonoscopy on 8/30 where several polyps were removed. Started having post-procedural bleeding after resuming anticoagulation with drop in hemoglobin. Here for colonoscopy to locate site of bleeding. Last took blood thinner on Sunday night  Precautions  Precautions Fall  Restrictions  Weight Bearing Restrictions No  Pain Assessment  Pain Assessment No/denies pain  Cognition  Arousal/Alertness Awake/alert  Behavior During Therapy WFL for tasks assessed/performed  Overall Cognitive Status Within Functional Limits for tasks assessed  Bed Mobility  Overal bed mobility Modified Independent  General bed mobility comments Nod I for use of bedrail and increased time to come into sitting.  Transfers  Overall transfer level Needs assistance  Equipment used None  Transfers Sit to/from Stand  Sit to Stand Min guard  General transfer comment CGA for initial sit <> stand transfer for steadying as pt with minimal reports of dizziness. Mod I for stand to sit for use of arms on armrests.  Ambulation/Gait  Ambulation/Gait assistance Min guard  Gait Distance (Feet) 75 Feet  Assistive device IV Pole  Gait Pattern/deviations Step-through pattern  General Gait Details Pt ambulated 75 feet pushing IV pole initially with CGa then progressed to SBA. Noted decreased gait speed and slightly guarded gait pattern. Pt deferred further ambulation distance secondary to fatigue.  Gait velocity decreased  Balance  Overall balance assessment Needs assistance  Sitting-balance support Feet supported  Sitting balance-Leahy Scale Good  Sitting balance - Comments no overt LOB with unsupported back  Standing balance support Single extremity supported  Standing balance-Leahy Scale Fair  Standing balance comment noted slight truncal sway requiring CGA for steadying and safety   Exercises  Exercises Other exercises  Other Exercises  Other Exercises pt performed alternating marches x 10 bilat and LAQ x 10 reps, 2 sets while sitting in chair; verbal cues for correct technique and controlled movements concentrically/eccentrically  Other Exercises 5xSTS performed in 20.12 sec; SBA for safety  PT - End of Session  Equipment Utilized During Treatment Gait belt  Activity Tolerance Patient limited by fatigue  Patient left in chair;with call bell/phone within reach;with SCD's reapplied  Nurse Communication Mobility status  PT Assessment  PT Recommendation/Assessment Patient needs continued PT services  PT Visit Diagnosis Unsteadiness on feet (R26.81);Muscle weakness (generalized) (M62.81);Dizziness and giddiness (R42)  PT Problem List Decreased strength;Decreased activity tolerance;Decreased balance;Decreased mobility;Cardiopulmonary status limiting activity  PT Plan  PT Frequency (ACUTE ONLY) Min 2X/week  PT Treatment/Interventions (ACUTE ONLY) DME instruction;Gait training;Stair training;Functional mobility training;Therapeutic activities;Therapeutic exercise;Balance training;Patient/family education  AM-PAC PT "6 Clicks" Mobility Outcome Measure (Version 2)  Help needed turning from your back to your side while in a flat bed without using bedrails? 4  Help needed moving from lying on your back to sitting on the side of a flat bed without using bedrails? 4  Help needed moving to and from a bed to a chair (including a wheelchair)? 3  Help needed standing up from a chair using your arms (e.g., wheelchair or bedside chair)? 3  Help needed to walk in hospital room? 3  Help needed climbing 3-5 steps with a railing?  3  6 Click Score 20  Consider Recommendation of Discharge To: Home with no services  PT Recommendation  Follow Up Recommendations Home health PT  PT equipment None recommended by PT  Individuals Consulted  Consulted and Agree with Results and  Recommendations  Patient  Acute Rehab PT Goals  Patient Stated Goal to go home  PT Goal Formulation With patient  Time For Goal Achievement 02/04/20  Potential to Achieve Goals Good  PT Time Calculation  PT Start Time (ACUTE ONLY) 1353  PT Stop Time (ACUTE ONLY) 1439  PT Time Calculation (min) (ACUTE ONLY) 46 min  PT General Charges  $$ ACUTE PT VISIT 1 Visit   Late entry  Clinical impression: Pt in bed upon arrival to room and agreeable to participate in PT session. Pt performed supine to sit with mod I for use of bedrails and pt required increased time to perform task. Pt with minimal reports of dizziness with bed mobility and transfers. Pt ambulated 75 feet pushing IV pole with CGA for steadying. Pt reported increased weakness and deferred further ambulation attempts. Pt's HR noted to reach 137 beats/minute during ambulation. Pt seated in recliner chair then performed therex for promotion of BLE strengthening. Pt with decreased functional activity tolerance today.  Recommend HHPT at discharge to optimize return to PLOF and maximize functional mobility.  Vitals: Semi supine: 89HR, 23RR, BP 113/61 Sitting: 105/57 Standing: 117/49  Vale Haven, SPT

## 2020-01-24 ENCOUNTER — Encounter: Payer: Self-pay | Admitting: Family Medicine

## 2020-01-24 ENCOUNTER — Other Ambulatory Visit: Payer: Self-pay | Admitting: Physician Assistant

## 2020-01-24 DIAGNOSIS — I950 Idiopathic hypotension: Secondary | ICD-10-CM

## 2020-01-24 DIAGNOSIS — K921 Melena: Secondary | ICD-10-CM

## 2020-01-24 DIAGNOSIS — I959 Hypotension, unspecified: Secondary | ICD-10-CM

## 2020-01-24 LAB — GLUCOSE, CAPILLARY
Glucose-Capillary: 162 mg/dL — ABNORMAL HIGH (ref 70–99)
Glucose-Capillary: 139 mg/dL — ABNORMAL HIGH (ref 70–99)

## 2020-01-24 LAB — BASIC METABOLIC PANEL
Anion gap: 8 (ref 5–15)
BUN: 11 mg/dL (ref 8–23)
CO2: 26 mmol/L (ref 22–32)
Calcium: 8.8 mg/dL — ABNORMAL LOW (ref 8.9–10.3)
Chloride: 105 mmol/L (ref 98–111)
Creatinine, Ser: 0.78 mg/dL (ref 0.44–1.00)
GFR calc Af Amer: >60 mL/min (ref >60)
GFR calc non Af Amer: >60 mL/min (ref >60)
Glucose, Bld: 121 mg/dL — ABNORMAL HIGH (ref 70–99)
Potassium: 3.7 mmol/L (ref 3.5–5.1)
Sodium: 139 mmol/L (ref 135–145)

## 2020-01-24 LAB — CBC
HCT: 25.3 % — ABNORMAL LOW (ref 36.0–46.0)
Hemoglobin: 8.9 g/dL — ABNORMAL LOW (ref 12.0–15.0)
MCH: 31.6 pg (ref 26.0–34.0)
MCHC: 35.2 g/dL (ref 30.0–36.0)
MCV: 89.7 fL (ref 80.0–100.0)
Platelets: 281 10*3/uL (ref 150–400)
RBC: 2.82 MIL/uL — ABNORMAL LOW (ref 3.87–5.11)
RDW: 13.5 % (ref 11.5–15.5)
WBC: 9.4 10*3/uL (ref 4.0–10.5)
nRBC: 0 % (ref 0.0–0.2)

## 2020-01-24 LAB — MAGNESIUM: Magnesium: 2.4 mg/dL (ref 1.7–2.4)

## 2020-01-24 MED ORDER — POTASSIUM CHLORIDE CRYS ER 20 MEQ PO TBCR
40.0000 meq | EXTENDED_RELEASE_TABLET | Freq: Once | ORAL | Status: AC
  Administered 2020-01-24: 40 meq via ORAL
  Filled 2020-01-24: qty 2

## 2020-01-24 MED ORDER — MIDODRINE HCL 5 MG PO TABS
5.0000 mg | ORAL_TABLET | Freq: Three times a day (TID) | ORAL | Status: DC
  Administered 2020-01-24: 5 mg via ORAL
  Filled 2020-01-24: qty 1

## 2020-01-24 MED ORDER — METOPROLOL TARTRATE 25 MG PO TABS: 25.0000 mg | ORAL_TABLET | Freq: Two times a day (BID) | ORAL | 2 refills | Status: DC

## 2020-01-24 MED ORDER — MIDODRINE HCL 5 MG PO TABS
5.0000 mg | ORAL_TABLET | Freq: Three times a day (TID) | ORAL | 2 refills | Status: DC
Start: 2020-01-24 — End: 2020-01-26

## 2020-01-24 NOTE — Progress Notes (Signed)
Patient discharged to home. Tele and IV d/c'd. Patient verbalizes understanding of discharge instructions. 

## 2020-01-24 NOTE — TOC Transition Note (Signed)
Transition of Care [facility]) - CM/SW Discharge Note   Patient Details  Name: Brandy [name] MRN: [id number] Date of Birth: Oct 06, 1955  [facility] CM/SW Contact:  Harriet Masson, RN Phone Number: 01/24/2020, 1:10 PM   Clinical Narrative:     Spoke with pt today and verified HHPT. Pt open to an agency. RN spoke with Jackquline [place] Tillie Rung) who accepted pt for HHPT to start on Thursday. Pt aware is anticipating a call for the initial home visit. MD and bedside RN aware. Pt has family to transport home the anticipated discharge day.    Final next level of care: [place] Barriers to Discharge: No Barriers Identified   Patient Goals and CMS Choice        Discharge Placement                       Discharge Plan and Services                        Representative spoke with at DME Agency: Tillie Rung HH Arranged: PT [facility] Agency: [facility] Date [place]: 01/24/20 Time Table [place]: 56 Representative spoke with at [place]: [place] ([place]) Interventions     Readmission Risk Interventions No flowsheet data found.

## 2020-01-24 NOTE — Discharge Summary (Signed)
Physician Discharge Summary  Brandy [name] BTD:[id number] DOB: 10-10-1955 DOA: 01/19/2020  PCP: Brandy Hess, MD  Admit date: 01/19/2020 Discharge date: 01/24/2020  Admitted From: home Disposition:  home  Recommendations for Outpatient Follow-up:  1. Follow up with PCP in 1-2 weeks 2. Please obtain BMP/CBC in one week - future order for CBC placed 3. Please follow up with cardiology next week 4. Follow up with gastroenterology 5. Please follow up on patient's blood pressure.  Her BP was low during admission, had to start midodrine for BP support in order to use metoprolol for HR control.  No sign of infection or other cause.  It is possible her recent weight loss and dietary improvements have improved her BP overall.  Follow up on whether midodrine is still needed.  Home Health: PT  Equipment/Devices: None   Discharge Condition: Stable  CODE STATUS: Full  Diet recommendation: Heart Healthy / Carb Modified  Discharge Diagnoses: Principal Problem:   Acute lower GI bleeding Active Problems:   BMI 33.0-33.9,adult   Atrial fibrillation with rapid ventricular response (HCC)   Colon polyps   Atrial fibrillation, chronic (HCC)   Diabetes mellitus type 2 in obese (HCC)   Chronic anticoagulation    Summary of HPI and Hospital Course:  Brandy [name] is a 64 y.o. Caucasian female with medical history significant for atrial fibrillation, diastolic CHF, diabetes mellitus, GERD, obesity, OSA presented to the ED on evening of 01/18/20 for evaluation of bright red blood per rectum in the setting of recent outpatient a colonoscopy with multiple polypectomies and EGD on 8/30.  Since the procedures, had been doing well at home until she developed bright red blood per rectum the night before presenting.  Patient takes Eliquis for stroke prevention given A-fib, it was held for 2 days prior to procedures and resume the night after colonoscopy was performed.     In the ED, patient hemodynamically stable  with hemoglobin 12.8 (down from 14.3).  GI was consulted in the ED, recommended to hold Eliquis and admit for further evaluation and close monitoring.  Patient underwent repeat colonoscopy on 9/6 where bleeding was found at the hepatic flexure, hemostatic clips placed.    Hypotension - Improved with midodrine.  Initially hypotension due to bleeding and acute anemia.  Once bleeding has stopped, patient continued with intermittent hypotension, requiring rate control med to be held at times.  Unclear exact etiology, but afebrile, no leukocytosis, and asymptomatic so low suspicion for underlying infection.  Likely better diet control and less sodium intake here.  Patient has been very focused on weight loss and nutrition since recent diabetes diagnosis.  Possibly lifestyle changes have improved her baseline BP.   --Continue midodrine 5 mg BID WC  --Monitor BP at home and bring to follow up.     Acute lower GI bleeding secondary to delayed post polypectomy bleeding -present on admission.  Resolved. GI was consulted. Underwent colonoscopy on 9/7, showed bleeding at the hepatic flexure due to recent polypectomy, hemostasis clips were placed. Anemia panel was obtained, iron studies, folate and B12 are normal.  Hbg remained stable around 9.   Permanent A. Fib / A. fib with RVR -patient is on digoxin and metoprolol at home.  Metoprolol was held due to low BP and patient subsequently developed RVR.   Anemia and hypotension are contributing to tachycardia.  Rate control was challenging due hypotension.  Patient was started on midodrine for BP to tolerate beta blocker.   --Cardiology was consulted --Continued  on digoxin --Eliquis held due to bleeding --Low dose of Lopressor resumed 9/8, but unable to titrate up due to low BP (was on high dose metop at home) --close cardiology follow up after d/c --patient to monitor BP and HR's at home  Chronic combined systolic/diastolic CHF -not acutely  decompensated.  Last echo in August 2020 showed normal EF and undetermined diastolic parameters. --Lasix held due to low BP --Continue metoprolol but reduced dose due to soft BP's  --Resume Lasix once BP tolerates   Generalized weakness -due to above.  PT evaluated, HH PT recommended.    Type 2 diabetes -recently diagnosed few months ago.   A1c in June was above 12, this admission A1c is 6.5%.   Covered with sliding scale insulin during admission. Repeat A1c in 3 months with primary care.  Obesity: Body mass index is 31.64 kg/m.  Complicates overall care and prognosis.  Patient actively engaged in lifestyle modifications and weight loss, encouraged to continue.    Discharge Instructions   Discharge Instructions    Call MD for:  extreme fatigue   Complete by: As directed    Call MD for:  persistant dizziness or light-headedness   Complete by: As directed    Call MD for:  temperature >100.4   Complete by: As directed    Diet - low sodium heart healthy   Complete by: As directed    Discharge instructions   Complete by: As directed    Please monitor your BP at home twice daily, before taking your metoprolol, write them down to bring to follow up with your doctors.  Dr. Rockey Situ with cardiology will follow up closely with you.    He recommended taking the midodrine (to help raise BP) first thing in the AM along with your metoprolol.    If your heart rate is fast, staying above 110-120 even when you are resting, please call cardiology office.   Increase activity slowly   Complete by: As directed      Allergies as of 01/24/2020   No Known Allergies     Medication List    STOP taking these medications   metoprolol succinate 100 MG 24 hr tablet Commonly known as: TOPROL-XL     TAKE these medications   apixaban 5 MG Tabs tablet Commonly known as: Eliquis Take 1 tablet (5 mg total) by mouth 2 (two) times daily.   blood glucose meter kit and supplies Kit Dispense based  on patient and insurance preference. Use 1-2 times per day to test BS   CRANBERRY PO Take 1 capsule by mouth daily as needed. Soft gel   digoxin 0.25 MG tablet Commonly known as: LANOXIN Take 1 tablet (0.25 mg total) by mouth daily.   furosemide 20 MG tablet Commonly known as: LASIX Take 20 mg by mouth daily as needed for fluid or edema.   Lavender Oil Oil Apply 1 application topically daily as needed.   Lemon Oil Oil Take 1 application by mouth daily as needed.   Lemongrass Oil Oil Apply 1 application topically daily as needed.   MAGNESIUM GLUCONATE PO Take 200 mg by mouth daily.   metFORMIN 500 MG 24 hr tablet Commonly known as: GLUCOPHAGE-XR Take 1 tablet (500 mg total) by mouth 2 (two) times daily before a meal.   metoprolol tartrate 25 MG tablet Commonly known as: LOPRESSOR Take 1 tablet (25 mg total) by mouth 2 (two) times daily.   midodrine 5 MG tablet Commonly known as: PROAMATINE Take 1 tablet (  5 mg total) by mouth 3 (three) times daily with meals.   multivitamin with minerals tablet Take 1 tablet by mouth daily.   OIL OF OREGANO PO Take 1 application by mouth daily. To left big toe   peppermint oil liquid Apply 1 application topically daily as needed.   polyethylene glycol-electrolytes 420 g solution Commonly known as: NuLYTELY Prepare according to package instructions. Starting at 5:00 PM: Drink one 8 oz glass of mixture every 15 minutes until you finish half of the jug. Five hours prior to procedure, drink 8 oz glass of mixture every 15 minutes until it is all gone. Make sure you do not drink anything 4 hours prior to your procedure.   vitamin C 500 MG tablet Commonly known as: ASCORBIC ACID Take 500 mg by mouth daily.   Vitamin D3 25 MCG (1000 UT) Caps Take 1 capsule by mouth 2 (two) times daily.   zinc gluconate 50 MG tablet Take 50 mg by mouth daily.       No Known  Allergies  Consultations:  Gastroenterology  Cardiology   Procedures/Studies: DG Abdomen Acute W/Chest  Result Date: 01/19/2020 CLINICAL DATA:  Rectal bleeding after colonoscopy, evaluate for free air EXAM: DG ABDOMEN ACUTE W/ 1V CHEST COMPARISON:  None. FINDINGS: Lungs are clear.  No pleural effusion or pneumothorax. The heart is normal in size. Nonobstructive bowel gas pattern. No evidence of free air under the diaphragm on the upright view. Cholecystectomy clips. Degenerative changes of the lumbar spine. Visualized bony pelvis appears intact. IMPRESSION: No evidence of acute cardiopulmonary disease. No evidence of small bowel obstruction or free air. Electronically Signed   By: Julian Hy M.D.   On: 01/19/2020 06:17       Subjective: Patient seen this AM.  Reports she feels well.  Heart rates better controlled.  Discussed keeping an eye on BP at home.     Discharge Exam: Vitals:   01/24/20 0853 01/24/20 1225  BP: (!) 109/47 (!) 108/46  Pulse: 74 75  Resp: 18 19  Temp: 98.1 F (36.7 C) 98.1 F (36.7 C)  SpO2: 100% 100%   Vitals:   01/23/20 1936 01/24/20 0333 01/24/20 0853 01/24/20 1225  BP: (!) 116/58 (!) 105/48 (!) 109/47 (!) 108/46  Pulse: 98 76 74 75  Resp: _0 Temp: 97.8 F (36.6 C) 98.1 F (36.7 C) 98.1 F (36.7 C) 98.1 F (36.7 C)  TempSrc: Oral Oral Oral Oral  SpO2: 98% 97% 100% 100%  Weight:  90.7 kg    Height:        General: Pt is alert, awake, not in acute distress Cardiovascular: irregular rhythm, regular rate, S1/S2 +, no rubs, no gallops Respiratory: CTA bilaterally, no wheezing, no rhonchi Abdominal: Soft, NT, ND, bowel sounds + Extremities: no edema, no cyanosis    The results of significant diagnostics from this hospitalization (including imaging, microbiology, ancillary and laboratory) are listed below for reference.     Microbiology: Recent Results (from the past 240 hour(s))  SARS Coronavirus 2 by RT PCR (hospital  order, performed in [facility] hospital lab) Nasopharyngeal Nasopharyngeal Swab     Status: None   Collection Time: 01/19/20  5:47 AM   Specimen: Nasopharyngeal Swab  Result Value Ref Range Status   SARS Coronavirus 2 NEGATIVE NEGATIVE Final    Comment: (NOTE) SARS-CoV-2 target nucleic acids are NOT DETECTED.  The SARS-CoV-2 RNA is generally detectable in upper and lower respiratory specimens during the acute phase of infection.  The lowest concentration of SARS-CoV-2 viral copies this assay can detect is 250 copies / mL. A negative result does not preclude SARS-CoV-2 infection and should not be used as the sole basis for treatment or other patient management decisions.  A negative result may occur with improper specimen collection / handling, submission of specimen other than nasopharyngeal swab, presence of viral mutation(s) within the areas targeted by this assay, and inadequate number of viral copies (<250 copies / mL). A negative result must be combined with clinical observations, patient history, and epidemiological information.  Fact Sheet for Patients:   StrictlyIdeas.no  Fact Sheet for Healthcare Providers: BankingDealers.co.za  This test is not yet approved or  cleared by the [place] FDA and has been authorized for detection and/or diagnosis of SARS-CoV-2 by FDA under an Emergency Use Authorization (EUA).  This EUA will remain in effect (meaning this test can be used) for the duration of the COVID-19 declaration under Section 564(b)(1) of the Act, 21 U.S.C. section 360bbb-3(b)(1), unless the authorization is terminated or revoked sooner.  Performed at [facility], [address] [place], [place] [address]      Labs: BNP (last 3 results) No results for input(s): BNP in the last 8760 hours. Basic Metabolic Panel: Recent Labs  Lab 01/19/20 2217 01/19/20 2217 01/20/20 0621 01/21/20 0435  01/22/20 0542 01/23/20 0557 01/24/20 0348  NA 137   < > 139 141 141 142 139  K 3.9   < > 3.8 3.6 3.5 3.5 3.7  CL 107   < > 109 109 108 107 105  CO2 20*   < > _0 GLUCOSE 182*   < > 123* 127* 141* 136* 121*  BUN 14   < > 10 5* <5* 8 11  CREATININE 0.68   < > 0.72 0.60 0.77 0.77 0.78  CALCIUM 8.5*   < > 8.5* 8.4* 8.7* 8.6* 8.8*  MG 2.1  --  2.0 2.2 2.2  --  2.4   < > = values in this interval not displayed.   Liver Function Tests: Recent Labs  Lab 01/18/20 2203  AST 27  ALT 28  ALKPHOS 72  BILITOT 0.7  PROT 8.0  ALBUMIN 4.3   No results for input(s): LIPASE, AMYLASE in the last 168 hours. No results for input(s): AMMONIA in the last 168 hours. CBC: Recent Labs  Lab 01/18/20 2203 01/19/20 0511 01/21/20 0435 01/21/20 1910 01/22/20 0542 01/23/20 0557 01/24/20 0348  WBC 9.9  --   --   --  7.3 7.1 9.4  HGB 14.3   < > 8.4* 9.2* 8.8* 9.0* 8.9*  HCT 41.4   < > 24.1* 26.1* 26.0* 25.5* 25.3*  MCV 90.8  --   --   --  93.2 89.8 89.7  PLT 284  --   --   --  212 232 281   < > = values in this interval not displayed.   Cardiac Enzymes: No results for input(s): CKTOTAL, CKMB, CKMBINDEX, TROPONINI in the last 168 hours. BNP: Invalid input(s): POCBNP CBG: Recent Labs  Lab 01/23/20 1220 01/23/20 1629 01/23/20 2105 01/24/20 0852 01/24/20 1219  GLUCAP 148* 104* 124* 162* 139*   D-Dimer No results for input(s): DDIMER in the last 72 hours. Hgb A1c No results for input(s): HGBA1C in the last 72 hours. Lipid Profile No results for input(s): CHOL, HDL, LDLCALC, TRIG, CHOLHDL, LDLDIRECT in the last 72 hours. Thyroid function studies No results for input(s): TSH, T4TOTAL,  T3FREE, THYROIDAB in the last 72 hours.  Invalid input(s): FREET3 Anemia work up No results for input(s): VITAMINB12, FOLATE, FERRITIN, TIBC, IRON, RETICCTPCT in the last 72 hours. Urinalysis No results found for: COLORURINE, APPEARANCEUR, [place], [place], [place], [place], [place],  [place], PROTEINUR, UROBILINOGEN, NITRITE, LEUKOCYTESUR Sepsis Labs Invalid input(s): PROCALCITONIN,  WBC,  LACTICIDVEN Microbiology Recent Results (from the past 240 hour(s))  SARS Coronavirus 2 by RT PCR (hospital order, performed in [facility] hospital lab) Nasopharyngeal Nasopharyngeal Swab     Status: None   Collection Time: 01/19/20  5:47 AM   Specimen: Nasopharyngeal Swab  Result Value Ref Range Status   SARS Coronavirus 2 NEGATIVE NEGATIVE Final    Comment: (NOTE) SARS-CoV-2 target nucleic acids are NOT DETECTED.  The SARS-CoV-2 RNA is generally detectable in upper and lower respiratory specimens during the acute phase of infection. The lowest concentration of SARS-CoV-2 viral copies this assay can detect is 250 copies / mL. A negative result does not preclude SARS-CoV-2 infection and should not be used as the sole basis for treatment or other patient management decisions.  A negative result may occur with improper specimen collection / handling, submission of specimen other than nasopharyngeal swab, presence of viral mutation(s) within the areas targeted by this assay, and inadequate number of viral copies (<250 copies / mL). A negative result must be combined with clinical observations, patient history, and epidemiological information.  Fact Sheet for Patients:   StrictlyIdeas.no  Fact Sheet for Healthcare Providers: BankingDealers.co.za  This test is not yet approved or  cleared by the [place] FDA and has been authorized for detection and/or diagnosis of SARS-CoV-2 by FDA under an Emergency Use Authorization (EUA).  This EUA will remain in effect (meaning this test can be used) for the duration of the COVID-19 declaration under Section 564(b)(1) of the Act, 21 U.S.C. section 360bbb-3(b)(1), unless the authorization is terminated or revoked sooner.  Performed at [facility], [address]  [place], [place] [address]      Time coordinating discharge: Over 30 minutes  SIGNED:   Ezekiel Slocumb, DO [facility] 01/24/2020, 1:14 PM   If 7PM-7AM, please contact night-coverage [email or web address]

## 2020-01-24 NOTE — Progress Notes (Signed)
Progress Note  Patient Name: Brandy [name] Date of Encounter: 01/24/2020  Primary Cardiologist: Caryl Comes  Subjective   Restarted Eliquis in the evening of 9/10 with stable HGB this morning of 8.9. Has ambulated around the nurse station without cardiac limitation. No chest pain, dyspnea, palpitations, hematochezia, melena, or dizziness.   Inpatient Medications    Scheduled Meds: . apixaban  5 mg Oral BID  . cholecalciferol  1,000 Units Oral BID  . digoxin  0.25 mg Oral Daily  . insulin aspart  0-15 Units Subcutaneous TID WC  . insulin aspart  0-5 Units Subcutaneous QHS  . metoprolol tartrate  25 mg Oral BID  . midodrine  5 mg Oral BID WC  . mupirocin ointment  1 application Nasal BID   Continuous Infusions:  PRN Meds: furosemide   Vital Signs    Vitals:   01/23/20 1219 01/23/20 1629 01/23/20 1936 01/24/20 0333  BP: (!) 101/54 115/69 (!) 116/58 (!) 105/48  Pulse: 82 79 98 76  Resp: 17 19 16 18   Temp: 98.2 F (36.8 C) 97.8 F (36.6 C) 97.8 F (36.6 C) 98.1 F (36.7 C)  TempSrc: Oral Oral Oral Oral  SpO2: 98% 100% 98% 97%  Weight:    90.7 kg  Height:        Intake/Output Summary (Last 24 hours) at 01/24/2020 0805 Last data filed at 01/24/2020 0115 Gross per 24 hour  Intake 240 ml  Output 0 ml  Net 240 ml   Filed Weights   01/18/20 2151 01/23/20 0352 01/24/20 0333  Weight: 92 kg 91.6 kg 90.7 kg    Telemetry    Afib 70s to 80s bpm with rare episodes into the 110s bpm - Personally Reviewed  ECG    No new tracings - Personally Reviewed  Physical Exam   GEN: No acute distress.   Neck: No JVD. Cardiac: Irregularly irregular, no murmurs, rubs, or gallops.  Respiratory: Clear to auscultation bilaterally.  GI: Soft, nontender, non-distended.   MS: No edema; No deformity. Neuro:  Alert and oriented x 3; Nonfocal.  Psych: Normal affect.  Labs    Chemistry Recent Labs  Lab 01/18/20 2203 01/19/20 2217 01/22/20 0542 01/23/20 0557 01/24/20 0348  NA  141   < > 141 142 139  K 4.0   < > 3.5 3.5 3.7  CL 108   < > 108 107 105  CO2 23   < > 26 28 26   GLUCOSE 170*   < > 141* 136* 121*  BUN 19   < > <5* 8 11  CREATININE 0.84   < > 0.77 0.77 0.78  CALCIUM 9.7   < > 8.7* 8.6* 8.8*  PROT 8.0  --   --   --   --   ALBUMIN 4.3  --   --   --   --   AST 27  --   --   --   --   ALT 28  --   --   --   --   ALKPHOS 72  --   --   --   --   BILITOT 0.7  --   --   --   --   GFRNONAA >60   < > >60 >60 >60  GFRAA >60   < > >60 >60 >60  ANIONGAP 10   < > 7 7 8    < > = values in this interval not displayed.     Hematology Recent Labs  Lab 01/22/20 0542 01/23/20 0557 01/24/20 0348  WBC 7.3 7.1 9.4  RBC 2.79* 2.84* 2.82*  HGB 8.8* 9.0* 8.9*  HCT 26.0* 25.5* 25.3*  MCV 93.2 89.8 89.7  MCH 31.5 31.7 31.6  MCHC 33.8 35.3 35.2  RDW 13.0 13.2 13.5  PLT 212 232 281    Cardiac EnzymesNo results for input(s): TROPONINI in the last 168 hours. No results for input(s): TROPIPOC in the last 168 hours.   BNPNo results for input(s): BNP, PROBNP in the last 168 hours.   DDimer No results for input(s): DDIMER in the last 168 hours.   Radiology    No results found.  Cardiac Studies   2D echo 12/2018: 1. The left ventricle has normal systolic function with an ejection  fraction of 60-65%. The cavity size was normal. There is mildly increased  left ventricular wall thickness. Left ventricular diastolic function could  not be evaluated secondary to atrial  fibrillation. No evidence of left ventricular regional wall motion  abnormalities.  2. The right ventricle has normal systolic function. The cavity was  normal. There is no increase in right ventricular wall thickness. Right  ventricular systolic pressure is upper normal to mildly elevated (25-30  mmHg plus central venous pressure).  3. Right atrial size was mildly dilated.  4. The aortic valve is tricuspid.  5. The aorta is normal unless otherwise noted.  6. The interatrial septum was not  well visualized.  Patient Profile     64 y.o. female with history of permanent Afib on Eliquis, HFrEF secondary to NICM with subsequent normalization of EF, DM, and OSA who we are seeing for Afib with RVR in the setting of GI bleeding.   Assessment & Plan    1. Permanent Afib with RVR: -Ventricular rates well controlled in the 70s to 80s bpm predominantly  -Continue rate control with Lopressor and digoxin -Last digoxin level 0.7 from 01/23/2020 -CHADS2VASc at least 4 (CHF, age x 1, DM, gender) -Eliquis 5 mg bid with close monitoring of HGB  2. HFrEF secondary to NICM with subsequent normalization of LVSF: -Euvolemic and well compensated  -Continue prn Lasix -Lopressor   3. Hematochezia: -S/p colonoscopy with hemostatic clips placed -HGB low, though stable s/p restarting of Eliquis on the evening of 9/10 -Will have the patient follow up with a CBC next week, future order placed  4. Hypotension: -Stable -Remains on midodrine, taper as able in the outpatient setting  -Metoprolol for rate control as above   Dispo: -Ok for discharge from a cardiac perspective today, once she has been seen by Dr. Rockey Situ  For questions or updates, please contact [facility] Please consult [email or web address] for contact info under Cardiology/STEMI.    Signed, Christell Faith, PA-C [place] Pager: [phone] 01/24/2020, 8:05 AM

## 2020-01-26 ENCOUNTER — Other Ambulatory Visit: Payer: Self-pay

## 2020-01-26 ENCOUNTER — Other Ambulatory Visit: Payer: Self-pay | Admitting: Internal Medicine

## 2020-01-26 ENCOUNTER — Encounter: Payer: Self-pay | Admitting: Gastroenterology

## 2020-01-26 ENCOUNTER — Telehealth: Payer: Self-pay | Admitting: Cardiovascular Disease

## 2020-01-26 ENCOUNTER — Telehealth (INDEPENDENT_AMBULATORY_CARE_PROVIDER_SITE_OTHER): Payer: Medicare Other | Admitting: Gastroenterology

## 2020-01-26 DIAGNOSIS — R1310 Dysphagia, unspecified: Secondary | ICD-10-CM | POA: Diagnosis not present

## 2020-01-26 DIAGNOSIS — E118 Type 2 diabetes mellitus with unspecified complications: Secondary | ICD-10-CM

## 2020-01-26 DIAGNOSIS — D122 Benign neoplasm of ascending colon: Secondary | ICD-10-CM

## 2020-01-26 MED ORDER — MIDODRINE HCL 5 MG PO TABS: 10.0000 mg | ORAL_TABLET | Freq: Three times a day (TID) | ORAL | 2 refills | Status: DC

## 2020-01-26 MED ORDER — METOPROLOL TARTRATE 25 MG PO TABS
25.0000 mg | ORAL_TABLET | Freq: Three times a day (TID) | ORAL | 2 refills | Status: DC
Start: 2020-01-26 — End: 2020-04-07

## 2020-01-26 NOTE — Telephone Encounter (Signed)
Requested Prescriptions  Pending Prescriptions Disp Refills  . metFORMIN (GLUCOPHAGE-XR) 500 MG 24 hr tablet [Pharmacy Med Name: metFORMIN HCl ER 500 MG Oral Tablet Extended Release 24 Hour] 180 tablet 0    Sig: TAKE 1 TABLET BY MOUTH TWICE DAILY BEFORE MEAL(S)     Endocrinology:  Diabetes - Biguanides Passed - 01/26/2020  5:30 AM      Passed - Cr in normal range and within 360 days    Creatinine  Date Value Ref Range Status  07/31/2011 1.04 0.60 - 1.30 mg/dL Final   Creatinine, Ser  Date Value Ref Range Status  01/24/2020 0.78 0.44 - 1.00 mg/dL Final         Passed - HBA1C is between 0 and 7.9 and within 180 days    Hgb A1c MFr Bld  Date Value Ref Range Status  01/19/2020 6.5 (H) 4.8 - 5.6 % Final    Comment:    (NOTE) Pre diabetes:          5.7%-6.4%  Diabetes:              >6.4%  Glycemic control for   <7.0% adults with diabetes          Passed - eGFR in normal range and within 360 days    EGFR (African American)  Date Value Ref Range Status  07/31/2011 >60 >20m/min Final   GFR calc Af Amer  Date Value Ref Range Status  01/24/2020 >60 >60 mL/min Final   EGFR (Non-African Amer.)  Date Value Ref Range Status  07/31/2011 58 (L) >670mmin Final    Comment:    eGFR values <6058min/1.73 m2 may be an indication of chronic kidney disease (CKD). Calculated eGFR, using the MRDR Study equation, is useful in  patients with stable renal function. The eGFR calculation will not be reliable in acutely ill patients when serum creatinine is changing rapidly. It is not useful in patients on dialysis. The eGFR calculation may not be applicable to patients at the low and high extremes of body sizes, pregnant women, and vegetarians.    GFR calc non Af Amer  Date Value Ref Range Status  01/24/2020 >60 >60 mL/min Final         Passed - Valid encounter within last 6 months    Recent Outpatient Visits          3 months ago [facility]   3 months [facility]      Future Appointments            In 1 week BerArmy Mel[facility]uJesse SansD [facility]

## 2020-01-26 NOTE — Telephone Encounter (Signed)
STAT if HR is under 50 or over 120 (normal HR is 60-100 beats per minute)  1) What is your heart rate? 100-120  2) Do you have a log of your heart rate readings (document readings)?   3) Do you have any other symptoms? Shaky, BP is 115/60   Patient saw Dr Rockey Situ in the hospital this weekend, states that HR has been high even with taking medications as directed.  Would like to discuss.

## 2020-01-26 NOTE — Telephone Encounter (Signed)
Call to patient to discuss 6 AM lying 116/61, 78 sitting 112/62, 93 standing 95/70 119 all before medication. 11A 115/60 HR 98. One episode at rest, HR at lunch 137.  At rest 100.  Having dizziness when standing too quickly, no chest pain or SOB.   Pt has appt next week.

## 2020-01-26 NOTE — Telephone Encounter (Signed)
Verbal discussion with Dr. Rockey Situ to add midday dose of lopressor and increase midodrine to 10 mg TID.  Pt to continue watching for melena and [facility]. Will bring BP values to next appt.   Will keep appt Monday and call if any other questions arise.

## 2020-01-26 NOTE — Telephone Encounter (Signed)
TCM....  Patient iDc 9/11 from [facility]    They are scheduled to see  Gilford Rile 9/20 at 0830   They were seen for PAF  They need to be seen within 7-10 days      Please call

## 2020-01-27 ENCOUNTER — Telehealth: Payer: Self-pay | Admitting: Internal Medicine

## 2020-01-27 ENCOUNTER — Other Ambulatory Visit
Admission: RE | Admit: 2020-01-27 | Discharge: 2020-01-27 | Disposition: A | Discharge status: Home / Self Care | Payer: Medicare Other | Attending: Physician Assistant | Admitting: Physician Assistant

## 2020-01-27 ENCOUNTER — Other Ambulatory Visit: Payer: Self-pay

## 2020-01-27 DIAGNOSIS — K921 Melena: Secondary | ICD-10-CM | POA: Insufficient documentation

## 2020-01-27 LAB — CBC
HCT: 28.3 % — ABNORMAL LOW (ref 36.0–46.0)
Hemoglobin: 9.4 g/dL — ABNORMAL LOW (ref 12.0–15.0)
MCH: 31 pg (ref 26.0–34.0)
MCHC: 33.2 g/dL (ref 30.0–36.0)
MCV: 93.4 fL (ref 80.0–100.0)
Platelets: 358 10*3/uL (ref 150–400)
RBC: 3.03 MIL/uL — ABNORMAL LOW (ref 3.87–5.11)
RDW: 13.5 % (ref 11.5–15.5)
WBC: 9.1 10*3/uL (ref 4.0–10.5)
nRBC: 0 % (ref 0.0–0.2)

## 2020-01-27 NOTE — Progress Notes (Signed)
Brandy Antigua, MD [address]  [place], [place] [address]  Main: [phone]  Fax: [phone]   Primary Care Physician: Glean Hess, MD  Virtual Visit via Telephone Note  I connected with patient on 01/27/20 at 11:15 AM EDT by telephone and verified that I am speaking with the correct person using two identifiers.   I discussed the limitations, risks, security and privacy concerns of performing an evaluation and management service by telephone and the availability of in person appointments. I also discussed with the patient that there may be a patient responsible charge related to this service. The patient expressed understanding and agreed to proceed.  Location of Patient: Home Location of Provider: Home Persons involved: Patient and provider only during the visit (nursing staff and front desk staff was involved in communicating with the patient prior to the appointment, reviewing medications and checking them in)   History of Present Illness: Chief Complaint  Patient presents with  . GI Bleeding     HPI: Brandy [name] is a 64 y.o. female here for follow-up after recent hospitalization for post polypectomy bleed.  Patient underwent screening colonoscopy on August 30th 2021 with multiple colon polyps removed.  Patient was admitted on January 20, 2020 as she started having bright red blood per rectum that day.  She had resumed her Eliquis post procedure.  She underwent colonoscopy during hospitalization and a right-sided colon polyp with stigmata of recent bleeding was seen at the hepatic flexure and 3 clips were placed.  Patient has not had any recurrent bleeding since the procedure.  Patient was discharged with a hemoglobin of 8.9 and this has improved to 9.4.  During her colonoscopy on August 30 a multilobulated large polyp was also seen that was not removed and a tattoo was placed at this site.  Patient has been referred to [facility] and is awaiting their  call.  Patient states her dysphagia has resolved since her last visit.  She underwent EGD on August 30 for this as well, with normal esophagus.  Gastric erythema and few gastric polyps were seen.  Pathology did not show EOE.  Current Outpatient Medications  Medication Sig Dispense Refill  . apixaban (ELIQUIS) 5 MG TABS tablet Take 1 tablet (5 mg total) by mouth 2 (two) times daily. 180 tablet 1  . blood glucose meter kit and supplies KIT Dispense based on patient and insurance preference. Use 1-2 times per day to test BS 1 each 0  . Cholecalciferol (VITAMIN D3) 1000 units CAPS Take 1 capsule by mouth 2 (two) times daily.     . digoxin (LANOXIN) 0.25 MG tablet Take 1 tablet (0.25 mg total) by mouth daily. 30 tablet 11  . MAGNESIUM GLUCONATE PO Take 200 mg by mouth daily.     . Multiple Vitamins-Minerals (MULTIVITAMIN WITH MINERALS) tablet Take 1 tablet by mouth daily.     . vitamin C (ASCORBIC ACID) 500 MG tablet Take 500 mg by mouth daily.     [name] zinc gluconate 50 MG tablet Take 50 mg by mouth daily.    [name] CRANBERRY PO Take 1 capsule by mouth daily as needed. Soft gel  (Patient not taking: Reported on 01/26/2020)    . furosemide (LASIX) 20 MG tablet Take 20 mg by mouth daily as needed for fluid or edema. (Patient not taking: Reported on 01/26/2020)    . Lavender Oil OIL Apply 1 application topically daily as needed.  (Patient not taking: Reported on 01/26/2020)    .  Lemon Oil OIL Take 1 application by mouth daily as needed.  (Patient not taking: Reported on 01/26/2020)    . Lemongrass Oil OIL Apply 1 application topically daily as needed.  (Patient not taking: Reported on 01/19/2020)    . metFORMIN (GLUCOPHAGE-XR) 500 MG 24 hr tablet TAKE 1 TABLET BY MOUTH TWICE DAILY BEFORE MEAL(S) 180 tablet 0  . metoprolol tartrate (LOPRESSOR) 25 MG tablet Take 1 tablet (25 mg total) by mouth in the morning, at noon, and at bedtime. 60 tablet 2  . midodrine (PROAMATINE) 5 MG tablet Take 2 tablets (10 mg total) by  mouth 3 (three) times daily with meals. 90 tablet 2  . OIL OF OREGANO PO Take 1 application by mouth daily. To left big toe (Patient not taking: Reported on 01/26/2020)    . peppermint oil liquid Apply 1 application topically daily as needed.  (Patient not taking: Reported on 01/19/2020)     No current facility-administered medications for this visit.    Allergies as of 01/26/2020  . (No Known Allergies)    Review of Systems:    All systems reviewed and negative except where noted in HPI.   Observations/Objective:  Labs: CMP     Component Value Date/Time   NA 139 01/24/2020 0348   NA 133 (L) 10/20/2019 1520   NA 141 07/31/2011 0930   K 3.7 01/24/2020 0348   K 4.8 07/31/2011 0930   CL 105 01/24/2020 0348   CL 104 07/31/2011 0930   CO2 26 01/24/2020 0348   CO2 28 07/31/2011 0930   GLUCOSE 121 (H) 01/24/2020 0348   GLUCOSE 122 (H) 07/31/2011 0930   BUN 11 01/24/2020 0348   BUN 12 10/20/2019 1520   BUN 11 07/31/2011 0930   CREATININE 0.78 01/24/2020 0348   CREATININE 1.04 07/31/2011 0930   CALCIUM 8.8 (L) 01/24/2020 0348   CALCIUM 9.7 07/31/2011 0930   PROT 8.0 01/18/2020 2203   PROT 7.8 12/15/2019 1148   PROT 8.4 (H) 07/31/2011 0930   ALBUMIN 4.3 01/18/2020 2203   ALBUMIN 4.3 12/15/2019 1148   ALBUMIN 4.5 07/31/2011 0930   AST 27 01/18/2020 2203   AST 27 07/31/2011 0930   ALT 28 01/18/2020 2203   ALT 63 07/31/2011 0930   ALKPHOS 72 01/18/2020 2203   ALKPHOS 108 07/31/2011 0930   BILITOT 0.7 01/18/2020 2203   BILITOT 0.5 12/15/2019 1148   BILITOT 0.5 07/31/2011 0930   GFRNONAA >60 01/24/2020 0348   GFRNONAA 58 (L) 07/31/2011 0930   GFRAA >60 01/24/2020 0348   GFRAA >60 07/31/2011 0930   Lab Results  Component Value Date   WBC 9.1 01/27/2020   HGB 9.4 (L) 01/27/2020   HCT 28.3 (L) 01/27/2020   MCV 93.4 01/27/2020   PLT 358 01/27/2020    Imaging Studies: DG Abdomen Acute W/Chest  Result Date: 01/19/2020 CLINICAL DATA:  Rectal bleeding after colonoscopy,  evaluate for free air EXAM: DG ABDOMEN ACUTE W/ 1V CHEST COMPARISON:  None. FINDINGS: Lungs are clear.  No pleural effusion or pneumothorax. The heart is normal in size. Nonobstructive bowel gas pattern. No evidence of free air under the diaphragm on the upright view. Cholecystectomy clips. Degenerative changes of the lumbar spine. Visualized bony pelvis appears intact. IMPRESSION: No evidence of acute cardiopulmonary disease. No evidence of small bowel obstruction or free air. Electronically Signed   By: Julian Hy M.D.   On: 01/19/2020 06:17    Assessment and Plan:   Brandy [name] is a 64 y.o.  y/o female here for follow-up of dysphagia, multiple colon polyps  Assessment and Plan: Dysphagia has resolved  Patient has a large multilobulated colon polyp that was not removed during August 30 colonoscopy.  Patient is awaiting to hear from [facility] as referral has been placed.  I have asked her to call us back next week if she does not hear from them by then.  Risk of this growing into cancer and possible cancer cells already present were discussed and importance of follow-up with [facility] discussed in detail and she verbalized understanding  After [facility] evaluate cerumen removed the above polyp opening (if possible), she will continue to need close surveillance colonoscopies, which is to be determined after her next colonoscopy at [facility].  No further evidence of bleeding since her hospital discharge.  Patient has resumed Eliquis  If active bleeding reoccurs, patient was advised to let us know immediately or go to the ER  Follow Up Instructions:    I discussed the assessment and treatment plan with the patient. The patient was provided an opportunity to ask questions and all were answered. The patient agreed with the plan and demonstrated an understanding of the instructions.   The patient was advised to call back or seek an in-person evaluation if the symptoms worsen or if the condition fails to improve as  anticipated.  I provided 22 minutes of non-face-to-face time during this encounter. Additional time was spent in reviewing patient's chart, placing orders etc.   Virgel Manifold, MD  Speech recognition software was used to dictate this note.

## 2020-01-27 NOTE — Telephone Encounter (Signed)
Copied from [place] [id number]. Topic: General - Other >> Jan 27, 2020  1:57 PM Celene Kras wrote: Reason for CRM: Pt called stating that when she was released from the hospital she was advised to contact PCP regarding outpatient therapy. Please advise.

## 2020-01-27 NOTE — Addendum Note (Signed)
Addended by: Santiago Bur on: 01/27/2020 11:21 AM   Modules accepted: Orders

## 2020-01-27 NOTE — Telephone Encounter (Signed)
Pt called stating that the current meter and strips that she is using the insurance is not going to cover them anymore. Pt is requesting to know which strips and meter she should be using from now on. Please advise.      [place], [place]  [place] [address]  Phone: [phone] Fax: [phone]  Hours: Not open 24 hours

## 2020-01-28 NOTE — Telephone Encounter (Signed)
Will cc Dr. Caryl [name] Seen in the hospital for GI bleed after colonoscopy, polypectomy Had repeat colonoscopy with clipping Persistent hypotension, atrial fibrillation with elevated rate, making titration of metoprolol challenging Placed on midodrine in the hospital She does report 50 to 60 pound weight loss over the past 6 months to year likely contributing to low blood pressure Scheduled to see our office next week, hospital follow-up and Dr. Caryl [name] in November

## 2020-01-29 DIAGNOSIS — L821 Other seborrheic keratosis: Secondary | ICD-10-CM | POA: Diagnosis not present

## 2020-01-29 DIAGNOSIS — Z872 Personal history of diseases of the skin and subcutaneous tissue: Secondary | ICD-10-CM | POA: Diagnosis not present

## 2020-01-29 DIAGNOSIS — Z86018 Personal history of other benign neoplasm: Secondary | ICD-10-CM | POA: Diagnosis not present

## 2020-01-29 DIAGNOSIS — L578 Other skin changes due to chronic exposure to nonionizing radiation: Secondary | ICD-10-CM | POA: Diagnosis not present

## 2020-02-02 ENCOUNTER — Ambulatory Visit: Payer: Medicare Other | Admitting: Family

## 2020-02-05 ENCOUNTER — Encounter: Payer: Self-pay | Admitting: Internal Medicine

## 2020-02-05 ENCOUNTER — Other Ambulatory Visit: Payer: Self-pay

## 2020-02-05 ENCOUNTER — Ambulatory Visit (INDEPENDENT_AMBULATORY_CARE_PROVIDER_SITE_OTHER): Payer: Medicare Other | Admitting: Internal Medicine

## 2020-02-05 VITALS — BP 110/70 | HR 58 | Ht 67.0 in | Wt 202.4 lb

## 2020-02-05 DIAGNOSIS — I428 Other cardiomyopathies: Secondary | ICD-10-CM | POA: Diagnosis not present

## 2020-02-05 DIAGNOSIS — I5022 Chronic systolic (congestive) heart failure: Secondary | ICD-10-CM | POA: Diagnosis not present

## 2020-02-05 DIAGNOSIS — I4819 Other persistent atrial fibrillation: Secondary | ICD-10-CM

## 2020-02-05 MED ORDER — MIDODRINE HCL 5 MG PO TABS: ORAL_TABLET | ORAL | 2 refills | Status: DC

## 2020-02-05 NOTE — Progress Notes (Signed)
Patient Care Team: Glean Hess, MD as PCP - General (Internal Medicine) Deboraha Sprang, MD as Consulting Physician (Cardiology) Lucilla Lame, MD as Consulting Physician (Gastroenterology) Elgie Collard, MD as Referring Physician (Obstetrics and Gynecology)   HPI  Brandy [name] is a 64 y.o. female Seen in follow-up for atrial fibrillation for which dofetilide was started 2/18 with spontaneous conversion. She has a history of nonischemic cardiomyopathy with persistent left ventricular dysfunction occurring in the context of her atrial fibrillation with rapid and poorly controlled rates. . LV function has normalized with rate control  She had recurrent episodes of atrial fibrillation.  She was referred to Dr. [name] to consider ablation.  It was elected to defer.  Now with permanent atrial fibrillation     Exercise tolerance is improved although avg HR are still elevated ( resting 80s and exertional 120's) but trend over 6 months neg  The patient denies chest pain, shortness of breath, nocturnal dyspnea, orthopnea or peripheral edema.  Some positional lightheadedness.  This is been worse since discharge from the hospital.  9/21 for hematochezia status post polypectomy Hypotension treated with midodrine; metoprolol decreased.  Dig continued  Has continued to lose weight now down about 70 pounds.  Target is another 25 pounds.  Date Cr K Mg Dig Hgb  3/18  0.76 3.9    14.5 (2/18)  4/18  0.76 4.2 2.0    5/18 0.83 4.9   15.1  2/19 0.84 4.1 2.1    10/19 0.96 4.5 2.3  13.6    9/21 0.78` 3.7  0.7 9.4   DATE TEST EF   11/17 Echo  15-20 %   1/18 Echo  35 %   5/18 Echo  50-55% LA size-normal   8/20 Echo 60-65%         Thromboembolic risk factors ( HTN-1, CHF-1, Gender-1) for a CHADSVASc Score of  3  Past Medical History:  Diagnosis Date  . Chicken pox   . Chronic systolic CHF (congestive heart failure) ([place])    a. 03/2016 Echo: Ef 15-20%, diff HK, ant AK;  b. 05/2016  Echo: Ef 30-35%, diff HK, mildly dil LA/RA.  . Diabetes mellitus without complication ([employer])   . GERD (gastroesophageal reflux disease)   . Heart murmur   . Hip discomfort    been going on for 40 years   . History of kidney stones   . Moderate mitral regurgitation    a. 03/2016 Echo: mod MR in setting of LV dysfxn.  . Motion sickness    car - back seat  . NICM (nonischemic cardiomyopathy) ([place])    a. 03/2016 Echo: EF 15-20%, diff HK, ant AK, mod MR, mod dil LA, mildly dil RA;  b. 04/2016 Cath: nl cors;  c. 05/2016 Echo: EF 30-35%, diff HK.  . Obesity   . Obstructive sleep apnea    compliant with CPAP  . Persistent atrial fibrillation ([place])    a. Dx 03/2016;  b. CHA2DS2VASc = 2-->Eliquis 66m BID;  b. 05/2016 Failed DCCV x 4.  . Sleep apnea   . Visit for monitoring Tikosyn therapy 07/24/2016    Past Surgical History:  Procedure Laterality Date  . BIOPSY N/A 01/12/2020   Procedure: BIOPSY;  Surgeon: TVirgel Manifold MD;  [place]  Service: Endoscopy;  Laterality: N/A;  . CARDIAC CATHETERIZATION N/A 04/17/2016   Procedure: Left Heart Cath and Coronary Angiography;  Surgeon: MWellington Hampshire MD;  [facility];  Service: Cardiovascular;  Laterality: N/A;  . CHOLECYSTECTOMY    . COLONOSCOPY N/A 01/20/2020   Procedure: COLONOSCOPY;  Surgeon: Lesly Rubenstein, MD;  [facility] ENDOSCOPY;  Service: Endoscopy;  Laterality: N/A;  . COLONOSCOPY WITH PROPOFOL N/A 01/12/2020   Procedure: COLONOSCOPY WITH PROPOFOL;  Surgeon: Virgel Manifold, MD;  Location: [place];  Service: Endoscopy;  Laterality: N/A;  Diabetic - oral meds sleep apnea  . ELECTROPHYSIOLOGIC STUDY N/A 05/26/2016   Procedure: Cardioversion;  Surgeon: Wellington Hampshire, MD;  Location: [facility] ORS;  Service: Cardiovascular;  Laterality: N/A;  . ESOPHAGOGASTRODUODENOSCOPY (EGD) WITH PROPOFOL N/A 01/12/2020   Procedure: ESOPHAGOGASTRODUODENOSCOPY (EGD) WITH PROPOFOL;  Surgeon:  Virgel Manifold, MD;  Location: [place];  Service: Endoscopy;  Laterality: N/A;  . POLYPECTOMY N/A 01/12/2020   Procedure: POLYPECTOMY;  Surgeon: Virgel Manifold, MD;  Location: [place];  Service: Endoscopy;  Laterality: N/A;    Current Outpatient Medications  Medication Sig Dispense Refill  . apixaban (ELIQUIS) 5 MG TABS tablet Take 1 tablet (5 mg total) by mouth 2 (two) times daily. 180 tablet 1  . blood glucose meter kit and supplies KIT Dispense based on patient and insurance preference. Use 1-2 times per day to test BS 1 each 0  . Cholecalciferol (VITAMIN D3) 1000 units CAPS Take 1 capsule by mouth 2 (two) times daily.     [name] CRANBERRY PO Take 1 capsule by mouth daily as needed. Soft gel     . digoxin (LANOXIN) 0.25 MG tablet Take 1 tablet (0.25 mg total) by mouth daily. 30 tablet 11  . furosemide (LASIX) 20 MG tablet Take 20 mg by mouth daily as needed for fluid or edema.     Laurina Bustle Oil OIL Apply 1 application topically daily as needed.     Koren Bound Oil OIL Take 1 application by mouth daily as needed.     Cindy Hazy Oil OIL Apply 1 application topically daily as needed.     [name] MAGNESIUM GLUCONATE PO Take 50 mg by mouth 4 (four) times daily.    . metFORMIN (GLUCOPHAGE-XR) 500 MG 24 hr tablet TAKE 1 TABLET BY MOUTH TWICE DAILY BEFORE MEAL(S) 180 tablet 0  . metoprolol tartrate (LOPRESSOR) 25 MG tablet Take 1 tablet (25 mg total) by mouth in the morning, at noon, and at bedtime. 60 tablet 2  . midodrine (PROAMATINE) 5 MG tablet Take 2 tablets (10 mg total) by mouth 3 (three) times daily with meals. 90 tablet 2  . Multiple Vitamins-Minerals (MULTIVITAMIN WITH MINERALS) tablet Take 1 tablet by mouth daily.     . OIL OF OREGANO PO Take 1 application by mouth daily. To left big toe     . peppermint oil liquid Apply 1 application topically daily as needed.     . vitamin C (ASCORBIC ACID) 500 MG tablet Take 500 mg by mouth daily.     [name] zinc gluconate 50 MG  tablet Take 50 mg by mouth daily.    [name] MAGNESIUM GLUCONATE PO Take 200 mg by mouth daily.      No current facility-administered medications for this visit.    No Known Allergies    Review of Systems negative except from HPI and PMH  Physical Exam BP 110/70 (BP Location: Right Arm, Patient Position: Sitting, Cuff Size: Normal)   Pulse (!) 58   Ht '5\' 7"'  (1.702 m)   Wt 202 lb 6 oz (91.8 kg)   SpO2 99%  BMI 31.70 kg/m  Well developed and nourished in no acute distress HENT normal Neck supple with JVP-  flat   Clear Regular rate and rhythm, no murmurs or gallops Abd-soft with active BS No Clubbing cyanosis edema Skin-warm and dry A & Oriented  Grossly normal sensory and motor function  ECG atrial fibrillation at 58 Intervals-/08/38    Assessment and  Plan  Atrial fibrillation-persistent  Cardiomyopathy-- nonischemic-rate related resolved  High Risk Medication Surveillance-dig  Hypertension  Anemia  Obesity resolving    Indeed sometimes low.  Heart rates are also better, but I cannot explain why it so much better in the context of her anemia.  I do not know to what degree the obesity contributed to the rapid rates.  We will decrease her ProAmatine from 10--5 started in the hospital.  Hypertension currently not an issue.  She remains quite anemic.  She is taking a homeopathic remedy the best that I can tell facilitates iron absorption.  She is increasing her iron oral intake; have asked her to speak with her PCP tomorrow regarding iron supplementation  Discussion regarding vaccine is currently not vaccinated.  Reviewed some of the data.

## 2020-02-05 NOTE — Patient Instructions (Signed)
Medication Instructions:  - Your physician has recommended you make the following change in your medication:   1) DECREASE midodrine 5 mg- take 1 tablet (5 mg) by mouth three times a day at 6 am, 10 am, & 2 pm  *If you need a refill on your cardiac medications before your next appointment, please call your pharmacy*   Lab Work: - none ordered  If you have labs (blood work) drawn today and your tests are completely normal, you will receive your results only by: [name] MyChart Message (if you have MyChart) OR . A paper copy in the mail If you have any lab test that is abnormal or we need to change your treatment, we will call you to review the results.   Testing/Procedures: - none ordered   Follow-Up: At [facility], you and your health needs are our priority.  As part of our continuing mission to provide you with exceptional heart care, we have created designated Provider Care Teams.  These Care Teams include your primary Cardiologist (physician) and Advanced Practice Providers (APPs -  Physician Assistants and Nurse Practitioners) who all work together to provide you with the care you need, when you need it.  We recommend signing up for the patient portal called "MyChart".  Sign up information is provided on this After Visit Summary.  MyChart is used to connect with patients for Virtual Visits (Telemedicine).  Patients are able to view lab/test results, encounter notes, upcoming appointments, etc.  Non-urgent messages can be sent to your provider as well.   To learn more about what you can do with MyChart, go to [email or web address].    Your next appointment:   As scheduled   The format for your next appointment:   In Person  Provider:   Virl Axe, MD   Other Instructions

## 2020-02-06 ENCOUNTER — Telehealth: Payer: Self-pay | Admitting: Internal Medicine

## 2020-02-06 ENCOUNTER — Other Ambulatory Visit: Payer: Self-pay

## 2020-02-06 ENCOUNTER — Encounter: Payer: Self-pay | Admitting: Internal Medicine

## 2020-02-06 ENCOUNTER — Ambulatory Visit (INDEPENDENT_AMBULATORY_CARE_PROVIDER_SITE_OTHER): Payer: Medicare Other | Admitting: Internal Medicine

## 2020-02-06 ENCOUNTER — Other Ambulatory Visit: Payer: Self-pay | Admitting: Internal Medicine

## 2020-02-06 VITALS — BP 122/82 | HR 76 | Temp 98.2°F | Ht 67.0 in | Wt 204.0 lb

## 2020-02-06 DIAGNOSIS — E118 Type 2 diabetes mellitus with unspecified complications: Secondary | ICD-10-CM | POA: Diagnosis not present

## 2020-02-06 DIAGNOSIS — K922 Gastrointestinal hemorrhage, unspecified: Secondary | ICD-10-CM | POA: Diagnosis not present

## 2020-02-06 MED ORDER — BLOOD GLUCOSE METER KIT: PACK | 0 refills | Status: DC

## 2020-02-06 MED ORDER — BLOOD GLUCOSE MONITOR KIT: PACK | 0 refills | Status: DC

## 2020-02-06 NOTE — Progress Notes (Signed)
Date:  02/06/2020   Name:  Brandy [name]   DOB:  1955/07/30   MRN:  [id number]   Chief Complaint: Diabetes (follow up/ foot exam/ eye exam done 01/14/2020/ has covid question / last night 148)  Diabetes She presents for her follow-up diabetic visit. She has type 2 diabetes mellitus. The initial diagnosis of diabetes was made 6 months ago. Her disease course has been improving. Pertinent negatives for hypoglycemia include no headaches or tremors. Pertinent negatives for diabetes include no chest pain, no fatigue, no polydipsia and no polyuria. Symptoms are stable. Current diabetic treatment includes oral agent (monotherapy) (metformin bid). She is following a generally healthy diet. Her home blood glucose trend is decreasing steadily. An ACE inhibitor/angiotensin II receptor blocker is not being taken. She does not see a podiatrist.Eye exam is not current.   GI bleed - from colopectomy; now resolved and back on Eliquis.  Stools are normal, not dark. She is eating an iron rich diet but not taking any supplements. Still has some anxiety about the bleeding but overall feels well.  Abnormal labs - done in initial visit with high glucose, abnormal LFTs, low calcium reviewed.  These were repeated recently in the hospital and were all normal.   Lab Results  Component Value Date   CREATININE 0.78 01/24/2020   BUN 11 01/24/2020   NA 139 01/24/2020   K 3.7 01/24/2020   CL 105 01/24/2020   CO2 26 01/24/2020   Lab Results  Component Value Date   CHOL 191 10/31/2016   HDL 41.30 10/31/2016   LDLDIRECT 131.0 10/31/2016   TRIG 216.0 (H) 10/31/2016   CHOLHDL 5 10/31/2016   Lab Results  Component Value Date   TSH 1.880 10/20/2019   Lab Results  Component Value Date   HGBA1C 6.5 (H) 01/19/2020   Lab Results  Component Value Date   WBC 9.1 01/27/2020   HGB 9.4 (L) 01/27/2020   HCT 28.3 (L) 01/27/2020   MCV 93.4 01/27/2020   PLT 358 01/27/2020   Lab Results  Component Value Date   ALT 28  01/18/2020   AST 27 01/18/2020   ALKPHOS 72 01/18/2020   BILITOT 0.7 01/18/2020     Review of Systems  Constitutional: Negative for appetite change, fatigue, fever and unexpected weight change.  HENT: Negative for tinnitus and trouble swallowing.   Eyes: Negative for visual disturbance.  Respiratory: Negative for cough, chest tightness and shortness of breath.   Cardiovascular: Negative for chest pain, palpitations and leg swelling.  Gastrointestinal: Negative for abdominal pain.  Endocrine: Negative for polydipsia and polyuria.  Genitourinary: Negative for dysuria and hematuria.  Musculoskeletal: Negative for arthralgias.  Neurological: Negative for tremors, numbness and headaches.  Psychiatric/Behavioral: Negative for dysphoric mood.    Patient Active Problem List   Diagnosis Date Noted  . Hypotension 01/24/2020  . Chronic anticoagulation 01/20/2020  . Acute lower GI bleeding 01/19/2020  . Atrial fibrillation, chronic ([place]) 01/19/2020  . Diabetes mellitus type 2 in obese ([place]) 01/19/2020  . Gastric polyp   . Stomach irritation   . Special screening for malignant neoplasms, colon   . Colon polyps   . Nasal sinus congestion 10/20/2019  . Weight loss, non-intentional 10/20/2019  . Hyperglycemia 10/20/2019  . Varicose veins of both lower extremities 10/20/2019  . Dysphagia 10/20/2019  . Osteoarthritis of right hand 10/31/2016  . Persistent atrial fibrillation ([place])   . Atrial fibrillation with rapid ventricular response ([place])   . Nonischemic cardiomyopathy ([place]) 04/14/2016  .  BMI 33.0-33.9,adult 04/14/2016    No Known Allergies  Past Surgical History:  Procedure Laterality Date  . BIOPSY N/A 01/12/2020   Procedure: BIOPSY;  Surgeon: Virgel Manifold, MD;  Location: [facility];  Service: Endoscopy;  Laterality: N/A;  . CARDIAC CATHETERIZATION N/A 04/17/2016   Procedure: Left Heart Cath and Coronary Angiography;  Surgeon: Wellington Hampshire, MD;  Location: [place] [facility];  Service: Cardiovascular;  Laterality: N/A;  . CHOLECYSTECTOMY    . COLONOSCOPY N/A 01/20/2020   Procedure: COLONOSCOPY;  Surgeon: Lesly Rubenstein, MD;  Location: [facility] ENDOSCOPY;  Service: Endoscopy;  Laterality: N/A;  . COLONOSCOPY WITH PROPOFOL N/A 01/12/2020   Procedure: COLONOSCOPY WITH PROPOFOL;  Surgeon: Virgel Manifold, MD;  Location: [place];  Service: Endoscopy;  Laterality: N/A;  Diabetic - oral meds sleep apnea  . ELECTROPHYSIOLOGIC STUDY N/A 05/26/2016   Procedure: Cardioversion;  Surgeon: Wellington Hampshire, MD;  Location: [facility];  Service: Cardiovascular;  Laterality: N/A;  . ESOPHAGOGASTRODUODENOSCOPY (EGD) WITH PROPOFOL N/A 01/12/2020   Procedure: ESOPHAGOGASTRODUODENOSCOPY (EGD) WITH PROPOFOL;  Surgeon: Virgel Manifold, MD;  Location: [place];  Service: Endoscopy;  Laterality: N/A;  . POLYPECTOMY N/A 01/12/2020   Procedure: POLYPECTOMY;  Surgeon: Virgel Manifold, MD;  Location: [place];  Service: Endoscopy;  Laterality: N/A;    Social History   Tobacco Use  . Smoking status: Never Smoker  . Smokeless tobacco: Never Used  Vaping Use  . Vaping Use: Never used  Substance Use Topics  . Alcohol use: Not Currently  . Drug use: No     Medication list has been reviewed and updated.  Current Meds  Medication Sig  . apixaban (ELIQUIS) 5 MG TABS tablet Take 1 tablet (5 mg total) by mouth 2 (two) times daily.  . blood glucose meter kit and supplies KIT Dispense based on patient and insurance preference. Use 1-2 times per day to test BS  . Cholecalciferol (VITAMIN D3) 1000 units CAPS Take 1 capsule by mouth 2 (two) times daily.   [name] CRANBERRY PO Take 1 capsule by mouth daily as needed. Soft gel   . digoxin (LANOXIN) 0.25 MG tablet Take 1 tablet (0.25 mg total) by mouth daily.  Laurina Bustle Oil OIL Apply 1 application topically daily as needed.   Koren Bound Oil OIL Take 1 application by mouth daily as needed.   Cindy Hazy Oil OIL Apply 1 application topically daily as needed.   [name] MAGNESIUM GLUCONATE PO Take 50 mg by mouth 4 (four) times daily.  . metFORMIN (GLUCOPHAGE-XR) 500 MG 24 hr tablet TAKE 1 TABLET BY MOUTH TWICE DAILY BEFORE MEAL(S)  . metoprolol tartrate (LOPRESSOR) 25 MG tablet Take 1 tablet (25 mg total) by mouth in the morning, at noon, and at bedtime.  . midodrine (PROAMATINE) 5 MG tablet Take 1 tablet (5 mg) by mouth three times a day at 6 am, 10 am, & 2 pm  . Multiple Vitamins-Minerals (MULTIVITAMIN WITH MINERALS) tablet Take 1 tablet by mouth daily.   . OIL OF OREGANO PO Take 1 application by mouth daily. To left big toe   . peppermint oil liquid Apply 1 application topically daily as needed.   . vitamin C (ASCORBIC ACID) 500 MG tablet Take 500 mg by mouth daily.   [name] zinc gluconate 50 MG tablet Take 50 mg by mouth daily.  . [DISCONTINUED] MAGNESIUM GLUCONATE PO Take 200 mg by mouth daily.     PHQ 2/9 Scores 11/25/2019  10/24/2019 10/20/2019  PHQ - 2 Score 0 0 0  PHQ- 9 Score - 0 0    GAD 7 : Generalized Anxiety Score 10/24/2019 10/20/2019  Nervous, Anxious, on Edge 1 1  Control/stop worrying 0 0  Worry too much - different things 0 0  Trouble relaxing 0 0  Restless 0 0  Easily annoyed or irritable 0 0  Afraid - awful might happen 0 0  Total GAD 7 Score 1 1  Anxiety Difficulty Not difficult at all Not difficult at all    BP Readings from Last 3 Encounters:  02/06/20 122/82  02/05/20 110/70  01/24/20 (!) 108/46    Physical Exam Vitals and nursing note reviewed.  Constitutional:      General: She is not in acute distress.    Appearance: She is well-developed.  HENT:     Head: Normocephalic and atraumatic.  Pulmonary:     Effort: Pulmonary effort is normal. No respiratory distress.  Musculoskeletal:        General: Normal range of motion.  Skin:    General: Skin is warm and dry.     Findings: No rash.  Neurological:     Mental Status: She is alert and oriented to  person, place, and time.  Psychiatric:        Behavior: Behavior normal.        Thought Content: Thought content normal.     Wt Readings from Last 3 Encounters:  02/06/20 204 lb (92.5 kg)  02/05/20 202 lb 6 oz (91.8 kg)  01/24/20 200 lb (90.7 kg)    BP 122/82 (BP Location: Right Arm, Patient Position: Sitting)   Pulse 76   Temp 98.2 F (36.8 C) (Oral)   Ht '5\' 7"'  (1.702 m)   Wt 204 lb (92.5 kg)   SpO2 98%   BMI 31.95 kg/m   Assessment and Plan: 1. Type II diabetes mellitus with complication (HCC) Much improved with metformin and diet changes Continue current regimen - Microalbumin / creatinine urine ratio - blood glucose meter kit and supplies KIT; Dispense based on patient and insurance preference. Use up to four times daily as directed. (FOR ICD-9 250.00, 250.01).  Dispense: 1 each; Refill: 0  2. Acute lower GI bleeding Check CBC in about 10 days Continue iron rich diet; can also get over the counter iron supplement - CBC with Differential/Platelet; Future   3. Morbid (severe) obesity due to excess calories ([place]) Improving with diet changes Recommend that she pursue getting the Covid-19 vaccines   Partially dictated using [place]. Any errors are unintentional.  Halina Maidens, MD [facility]  02/06/2020

## 2020-02-06 NOTE — Telephone Encounter (Signed)
Copied from [place] [id number]. Topic: General - Other >> Feb 06, 2020 12:20 PM Rainey Pines A wrote: Patient was seen today and stated that the prescription she was given for her diabetes supply did not include her lancets so the pharmacy was unable to fill the prescription. Patient would like lancets prescription sent to pharmacy today . [place], [place]  Phone:  [phone] Fax:  [phone]

## 2020-02-06 NOTE — Telephone Encounter (Signed)
Called pt and left VM informing of new RX faxed to the pharmacy for her.   CM

## 2020-02-09 LAB — MICROALBUMIN / CREATININE URINE RATIO
Creatinine, Urine: 125.3 mg/dL
Microalb/Creat Ratio: 227 mg/g creat — ABNORMAL HIGH (ref 0–29)
Microalbumin, Urine: 284.3 ug/mL

## 2020-02-10 ENCOUNTER — Telehealth: Payer: Self-pay

## 2020-02-10 NOTE — Telephone Encounter (Signed)
Brandy [name] from [facility] procedures called wanting results and pathology report to be faxed [phone]. This information was faxed.

## 2020-02-12 DIAGNOSIS — K922 Gastrointestinal hemorrhage, unspecified: Secondary | ICD-10-CM | POA: Diagnosis not present

## 2020-02-13 ENCOUNTER — Telehealth: Payer: Self-pay

## 2020-02-13 LAB — CBC WITH DIFFERENTIAL/PLATELET
Basophils Absolute: 0.1 10*3/uL (ref 0.0–0.2)
Basos: 1 %
EOS (ABSOLUTE): 0.2 10*3/uL (ref 0.0–0.4)
Eos: 3 %
Hematocrit: 33.9 % — ABNORMAL LOW (ref 34.0–46.6)
Hemoglobin: 10.7 g/dL — ABNORMAL LOW (ref 11.1–15.9)
Immature Grans (Abs): 0 10*3/uL (ref 0.0–0.1)
Immature Granulocytes: 0 %
Lymphocytes Absolute: 3.1 10*3/uL (ref 0.7–3.1)
Lymphs: 40 %
MCH: 28.5 pg (ref 26.6–33.0)
MCHC: 31.6 g/dL (ref 31.5–35.7)
MCV: 90 fL (ref 79–97)
Monocytes Absolute: 0.9 10*3/uL (ref 0.1–0.9)
Monocytes: 11 %
Neutrophils Absolute: 3.5 10*3/uL (ref 1.4–7.0)
Neutrophils: 45 %
Platelets: 301 10*3/uL (ref 150–450)
RBC: 3.76 x10E6/uL — ABNORMAL LOW (ref 3.77–5.28)
RDW: 13.6 % (ref 11.7–15.4)
WBC: 7.8 10*3/uL (ref 3.4–10.8)

## 2020-02-13 NOTE — Telephone Encounter (Unsigned)
Copied from [place] [phone]. Topic: General - Inquiry >> Feb 13, 2020 11:01 AM [name] wrote: Reason for CRM: Pt wants to know when she needs to get her hemoglobin drawn again.  She said she is trying to watch it to see how much it is going up.  She has an appt on Oct 12th for a consult to have more polyps removed CB#  [phone]

## 2020-02-16 ENCOUNTER — Other Ambulatory Visit: Payer: Self-pay | Admitting: Internal Medicine

## 2020-02-16 DIAGNOSIS — E1321 Other specified diabetes mellitus with diabetic nephropathy: Secondary | ICD-10-CM

## 2020-02-16 MED ORDER — LISINOPRIL 5 MG PO TABS: 5.0000 mg | ORAL_TABLET | Freq: Every day | ORAL | 3 refills | Status: DC

## 2020-02-16 NOTE — Telephone Encounter (Signed)
Left VM informing patient of this information.  CM

## 2020-02-16 NOTE — Telephone Encounter (Signed)
Hgb was almost normal last check so I do not think she needs another CBC until she has bleeding again.

## 2020-02-17 ENCOUNTER — Other Ambulatory Visit: Payer: Self-pay

## 2020-02-17 ENCOUNTER — Telehealth: Payer: Self-pay | Admitting: Internal Medicine

## 2020-02-17 ENCOUNTER — Ambulatory Visit: Payer: Self-pay

## 2020-02-17 DIAGNOSIS — E118 Type 2 diabetes mellitus with unspecified complications: Secondary | ICD-10-CM

## 2020-02-17 NOTE — Telephone Encounter (Signed)
It has been shown to be helpful for people with proteinuria to protect kidneys  Thanks SK

## 2020-02-17 NOTE — Telephone Encounter (Signed)
Provider reviewed and placed referral for patient to see Diabetes Management in [facility] for Dietitian help.  CM

## 2020-02-17 NOTE — Telephone Encounter (Signed)
To Dr. Klein to review. 

## 2020-02-17 NOTE — Telephone Encounter (Signed)
Pt c/o medication issue:  1. Name of Medication: lisinopril   2. How are you currently taking this medication (dosage and times per day)? 5 mg po q d   3. Are you having a reaction (difficulty breathing--STAT)? No   4. What is your medication issue? Per patient pcp started this new med for protein in urine   She wants Dr. Caryl Comes to be aware please call to discuss.

## 2020-02-17 NOTE — Telephone Encounter (Signed)
Pt stated that she is concerned about being on 2 HTN meds. Pt stated that she is not having dizziness or side effects. Pt stated that she didn't know why she was spilling protein in urine. Advised pt that diseases like diabetes and HTN can damage the blood vessels in the kidney that filters waste out of the blood. Protein can leak into urine if vessels are damaged. Advised pt that many doctors want to protect the kidneys and will add low dose high BP med to help prevent further damage. Advised pt that she needs to limit protein, potassium, sodium and phosphorus in diet. Examples foods of each nutrient given to pt. Pt advised to look at kidney friendly diet on [employer] site. Pt would like a referral to a dietician. Pt tearful during call and emotional support given. Routing to office for provider to review.  Reason for Disposition . Health Information question, no triage required and triager able to answer question  Answer Assessment - Initial Assessment Questions 1. REASON FOR CALL or QUESTION: "What is your reason for calling today?" or "How can I best help you?" or "What question do you have that I can help answer?"   Message from Oneta Rack sent at 02/17/2020 2:43 PM EDT  Summary: Clinical Advice   Patient is taking lisinopril (ZESTRIL) 5 MG tablet and unsure if she should be taking with metoprolol tartrate (LOPRESSOR) 25 MG tablet due to both medications being blood pressure, please advise   Patient would like to know if there is anything she can do to remove the protein from her urine.  Protocols used: INFORMATION ONLY CALL - NO TRIAGE-A-AH

## 2020-02-18 ENCOUNTER — Ambulatory Visit (INDEPENDENT_AMBULATORY_CARE_PROVIDER_SITE_OTHER): Payer: Medicare Other | Admitting: Vascular Surgery

## 2020-02-18 NOTE — Telephone Encounter (Signed)
I spoke with the patient. I have advised her Dr. Caryl Comes has reviewed her message about Dr. Army Melia starting her on lisinopril.  She is aware he is ok with her taking this as it does help people with proteinuria to protect the kidneys.   The patient voices understanding and was appreciative for the call back.

## 2020-02-24 ENCOUNTER — Telehealth: Payer: Self-pay

## 2020-02-24 NOTE — Telephone Encounter (Signed)
Copied from [place] [id number]. Topic: Referral - Medical Records >> Feb 24, 2020  9:56 AM [name] wrote: Reason for CRM: Brandy [name] GI procedures dept calling to get a copy of patients last Colonoscopy 01/14/20 and her medical history  Fax number [phone]  CB#  [phone]

## 2020-02-25 NOTE — Telephone Encounter (Signed)
Faxed patients Colonoscopy to [facility].  CM

## 2020-03-02 ENCOUNTER — Telehealth: Payer: Self-pay | Admitting: Internal Medicine

## 2020-03-02 NOTE — Telephone Encounter (Signed)
   Primary Cardiologist: Virl Axe, MD  Chart reviewed as part of pre-operative protocol coverage. Patient was contacted 03/02/2020 in reference to pre-operative risk assessment for pending surgery as outlined below.  Brandy [name] was last seen on 02/05/20 by Dr. Caryl Comes.  Since that day, Brandy [name] has done well.  Left heart cath 2017 with angiographically normal coronaries. She can complete more than 4.0 METS without angina.   Per our clinical pharmacist: Patient with diagnosis of A Fib on Eliquis for anticoagulation.    Procedure: colonoscopy Date of procedure: TBD      :[id number]}  CHA2DS2-VASc Score = 5  This indicates a 7.2% annual risk of stroke. The patient's score is based upon: CHF History: 1 HTN History: 1 Diabetes History: 1 Stroke History: 0 Vascular Disease History: 0 Age Score: 1 Gender Score: 1    CrCl 83.44mL/min using adjusted body weight Platelet count 301K  Per office protocol, patient can hold Eliquis for 2 days prior to procedure.  Therefore, based on ACC/AHA guidelines, the patient would be at acceptable risk for the planned procedure without further cardiovascular testing.   The patient was advised that if she develops new symptoms prior to surgery to contact our office to arrange for a follow-up visit, and she verbalized understanding.  I will route this recommendation to the requesting party via Epic fax function and remove from pre-op pool. Please call with questions.  Tami Lin Collyn Ribas, PA 03/02/2020, 2:47 PM

## 2020-03-02 NOTE — Telephone Encounter (Signed)
   [facility] Pre-operative Risk Assessment    HEARTCARE STAFF: - Please ensure there is not already an duplicate clearance open for this procedure. - Under Visit Info/Reason for Call, type in Other and utilize the format Clearance MM/DD/YY or Clearance TBD. Do not use dashes or single digits. - If request is for dental extraction, please clarify the # of teeth to be extracted.  Request for surgical clearance:  1. What type of surgery is being performed? Colonoscopy   2. When is this surgery scheduled? TBD   3. What type of clearance is required (medical clearance vs. Pharmacy clearance to hold med vs. Both)? BOTH  4. Are there any medications that need to be held prior to surgery and how long?please Eliquis please advise    5. Practice name and name of physician performing surgery? [facility]    6. What is the office phone number? [phone]   7.   What is the office fax number? [phone]  8.   Anesthesia type (None, local, MAC, general) ? Not noted    Clarisse Gouge 03/02/2020, 9:00 AM  _________________________________________________________________   (provider comments below)

## 2020-03-02 NOTE — Telephone Encounter (Signed)
Patient with diagnosis of A Fib on Eliquis for anticoagulation.    Procedure: colonoscopy Date of procedure: TBD      :[id number]}  CHA2DS2-VASc Score = 5  This indicates a 7.2% annual risk of stroke. The patient's score is based upon: CHF History: 1 HTN History: 1 Diabetes History: 1 Stroke History: 0 Vascular Disease History: 0 Age Score: 1 Gender Score: 1    CrCl 83.67mL/min using adjusted body weight Platelet count 301K  Per office protocol, patient can hold Eliquis for 2 days prior to procedure.

## 2020-03-09 ENCOUNTER — Encounter: Payer: Self-pay | Admitting: Dietician

## 2020-03-09 ENCOUNTER — Other Ambulatory Visit: Payer: Self-pay

## 2020-03-09 ENCOUNTER — Encounter: Payer: Medicare Other | Attending: Internal Medicine | Admitting: Dietician

## 2020-03-09 VITALS — Ht 67.0 in | Wt 197.8 lb

## 2020-03-09 DIAGNOSIS — E119 Type 2 diabetes mellitus without complications: Secondary | ICD-10-CM | POA: Diagnosis not present

## 2020-03-09 NOTE — Progress Notes (Signed)
Medical Nutrition Therapy: Visit start time: 1100  end time: 1230  Assessment:  Diagnosis: type 2 diabetes  Past medical history: sleep apnea Psychosocial issues/ stress concerns: pt rates stress level as "high" and feels "ok" about stress management skills   Preferred learning method:   Auditory  Visual  Hands-on  Current weight: 197.8 lbs  Height: 5'7" Medications, supplements: reconciled in medical record  Labs Microalb/Creat ratio 227 (ref range 0-29)  Progress and evaluation:   Pt checking BGs at least once a day; FBGs: 111- 130s  Pt diagnosed with type 2 diabetes in June 2021; HgA1c 12.0 on 10/20/2019  Pt completed the comprehensive diabetes education classes on site starting in late July 2021  HgA1c 6.5 as of 01/19/2020  Pt has not been formally diagnosed with kidney disease but states her nurse advised her to watch her potassium, phosphorus, and sodium intake due to lab work showing high levels protein metabolites in her urine   Pt states her goal is to learn how to manage diabetes and preserve kidney function/health  Pt weighed 240+ lbs about a year ago, stating she has lost about 60 lbs since starting her weight loss journey   Pt notes cutting out excess snacking and emotional eating have been some of the changes she has made   Physical activity: 10 min walking/streching, 2-3 days/week   Dietary Intake:  Usual eating pattern includes 3 meals and 1-2 snacks per day. Dining out frequency: <2 meals per month.  Breakfast: fruit with PB, yogurt  Snack: apple/fruit with PB; grapes with cheese  Lunch: 1/2 sandwich; baked chicken with brown rice and kidney bean salad; [employer] bread with PB   Snack: same as above  Supper: soup (vegetable soup, tomato soup) Beverages: 60+ oz water, <8 oz whole milk couple times a week, 1 cup coffee with cream    Nutrition Care Education: Basic nutrition: basic food groups, appropriate nutrient balance, appropriate meal and snack schedule,  general nutrition guidelines    Weight control: importance of low sugar and low fat choices, portion control strategies Diabetes:  goals for BGs, appropriate meal and snack schedule, appropriate carb intake and balance, healthy carb choices, role of fiber, protein, fat Renal nutrition: moderate protein intake, quality protein sources, identifying high sodium foods, identifying food sources of potassium and phosphorus  Nutritional Diagnosis:  NB-1.1 Food and nutrition-related knowledge deficit As related to lack of prior exposure to accurate nutrition-related information about renal nutrition.  As evidenced by pt discussion and diet recall. [place]-2.2 Altered nutrition-related laboratory As related to kidney function.  As evidenced by microalb/creat ratio of 227(H) on 02/06/2020.  Intervention:  Discussion and instruction as noted above.  Emphasized importance of keeping F/V in diet and how to manage kidney function along with diabetes management.  Established goals for additional change.   Maintain adequate fruit and vegetable intake   Incorporate vegetables at lunch and dinner   Incorporate more F/V at snacks  Try frozen veggies that come in microwaveable pouches (may be tender than fresh and low prep time)  Try canned fruit NOT in heavy syrup (mandarin oranges, peaches, pears, applesauce) for snacks + protein (cheese, PB)   Increase physical activity   150 minutes per week is recommendation    Increase fiber intake   Eat at least 3 servings of whole grains a day  Increase fruit and vegetable intake  Maintain fluid intake   Drink at least 64oz water daily   Decrease sodium intake   Reduce sodium intake  to recommended 1500mg /day   Read nutrition labels on packaged foods to monitor sodium intake   400-600 mg/meal  <200 mg/snack   Decrease fast food frequency   Avoid processed meats    Decrease saturated fat intake   Try more plant-based sources of protein  Limit  processed meats   Switch to low fat dairy products   Education Materials given:   General diet guidelines for Diabetes  Food lists for high sources of K, Phos, and Na  Mediterranean diet handout   My Personal Meal Plan  Goals/ instructions  Learner/ who was taught:   Patient   Level of understanding:  Verbalizes/ demonstrates competency  Demonstrated degree of understanding via:   Teach back Learning barriers:  None  Willingness to learn/ readiness for change:  Eager, change in progress  Monitoring and Evaluation:  Dietary intake, exercise, BGs and A1c, and body weight      follow up: December 21 at 10am

## 2020-03-09 NOTE — Patient Instructions (Addendum)
   Continue to avoid high sodium foods and read the labels for sodium   Focus on carbs that are complex not refined   Continue to listen to hunger/fullness cues

## 2020-03-29 DIAGNOSIS — G4733 Obstructive sleep apnea (adult) (pediatric): Secondary | ICD-10-CM | POA: Diagnosis not present

## 2020-04-07 ENCOUNTER — Encounter: Payer: Self-pay | Admitting: Internal Medicine

## 2020-04-07 ENCOUNTER — Other Ambulatory Visit: Payer: Self-pay

## 2020-04-07 ENCOUNTER — Ambulatory Visit: Payer: Medicare Other | Admitting: Internal Medicine

## 2020-04-07 VITALS — BP 100/64 | HR 78 | Ht 67.0 in | Wt 190.6 lb

## 2020-04-07 DIAGNOSIS — I428 Other cardiomyopathies: Secondary | ICD-10-CM | POA: Diagnosis not present

## 2020-04-07 DIAGNOSIS — E1321 Other specified diabetes mellitus with diabetic nephropathy: Secondary | ICD-10-CM

## 2020-04-07 DIAGNOSIS — I482 Chronic atrial fibrillation, unspecified: Secondary | ICD-10-CM

## 2020-04-07 DIAGNOSIS — E118 Type 2 diabetes mellitus with unspecified complications: Secondary | ICD-10-CM | POA: Diagnosis not present

## 2020-04-07 LAB — CBC
Hematocrit: 42.1 % (ref 34.0–46.6)
Hemoglobin: 14.6 g/dL (ref 11.1–15.9)
MCH: 30.5 pg (ref 26.6–33.0)
MCHC: 34.7 g/dL (ref 31.5–35.7)
MCV: 88 fL (ref 79–97)
Platelets: 308 10*3/uL (ref 150–450)
RBC: 4.79 x10E6/uL (ref 3.77–5.28)
RDW: 14.5 % (ref 11.7–15.4)
WBC: 10.1 10*3/uL (ref 3.4–10.8)

## 2020-04-07 MED ORDER — LISINOPRIL 2.5 MG PO TABS: 2.5000 mg | ORAL_TABLET | Freq: Every day | ORAL | 3 refills | Status: DC

## 2020-04-07 MED ORDER — METOPROLOL TARTRATE 50 MG PO TABS
50.0000 mg | ORAL_TABLET | Freq: Two times a day (BID) | ORAL | 3 refills | Status: DC
Start: 2020-04-07 — End: 2020-11-01

## 2020-04-07 MED ORDER — METFORMIN HCL ER 500 MG PO TB24: ORAL_TABLET | ORAL | 3 refills | Status: DC

## 2020-04-07 MED ORDER — MIDODRINE HCL 2.5 MG PO TABS
ORAL_TABLET | ORAL | Status: DC
Start: 2020-04-07 — End: 2020-04-13

## 2020-04-07 NOTE — Progress Notes (Signed)
Patient Care Team: Glean Hess, MD as PCP - General (Internal Medicine) Deboraha Sprang, MD as PCP - Cardiology (Cardiology) Deboraha Sprang, MD as Consulting Physician (Cardiology) Lucilla Lame, MD as Consulting Physician (Gastroenterology) Elgie Collard, MD as Referring Physician (Obstetrics and Gynecology)   HPI  Brandy [name] is a 64 y.o. female Seen in follow-up for atrial fibrillation for which dofetilide was started 2/18 with spontaneous conversion.She had recurrent episodes of atrial fibrillation.  She was referred to Dr. [name] to consider ablation.  It was elected to defer.  Now with permanent atrial fibrillation    She has a history of nonischemic cardiomyopathy with persistent left ventricular dysfunction occurring in the context of her atrial fibrillation with rapid and poorly controlled rates. . LV function has normalized with rate control  Exercising with heart rates in the 110s.  Minimal dyspnea.  No orthopnea nocturnal dyspnea or peripheral edema.  No chest pain.   Has continued to lose weight.  Most recent hemoglobin A1c about 6  GI blood loss related to a polypectomy.  Has been on iron.  Date Cr K Mg Dig Hgb  3/18  0.76 3.9    14.5 (2/18)  4/18  0.76 4.2 2.0    5/18 0.83 4.9   15.1  2/19 0.84 4.1 2.1    10/19 0.96 4.5 2.3  13.6    9/21 0.78 3.7  0.7 9.4 >>10.7   DATE TEST EF   11/17 Echo  15-20 %   1/18 Echo  35 %   5/18 Echo  50-55% LA size-normal   8/20 Echo 60-65%         Thromboembolic risk factors ( HTN-1, CHF-1, Gender-1) for a CHADSVASc Score of  3  Past Medical History:  Diagnosis Date  . Chicken pox   . Chronic systolic CHF (congestive heart failure) ([place])    a. 03/2016 Echo: Ef 15-20%, diff HK, ant AK;  b. 05/2016 Echo: Ef 30-35%, diff HK, mildly dil LA/RA.  . Diabetes mellitus without complication ([place])   . GERD (gastroesophageal reflux disease)   . Heart murmur   . Hip discomfort    been going on for 40 years   . History  of kidney stones   . Moderate mitral regurgitation    a. 03/2016 Echo: mod MR in setting of LV dysfxn.  . Motion sickness    car - back seat  . NICM (nonischemic cardiomyopathy) ([place])    a. 03/2016 Echo: EF 15-20%, diff HK, ant AK, mod MR, mod dil LA, mildly dil RA;  b. 04/2016 Cath: nl cors;  c. 05/2016 Echo: EF 30-35%, diff HK.  . Obesity   . Obstructive sleep apnea    compliant with CPAP  . Persistent atrial fibrillation ([place])    a. Dx 03/2016;  b. CHA2DS2VASc = 2-->Eliquis 31m BID;  b. 05/2016 Failed DCCV x 4.  . Sleep apnea   . Visit for monitoring Tikosyn therapy 07/24/2016    Past Surgical History:  Procedure Laterality Date  . BIOPSY N/A 01/12/2020   Procedure: BIOPSY;  Surgeon: TVirgel Manifold MD;  [facility]  Service: Endoscopy;  Laterality: N/A;  . CARDIAC CATHETERIZATION N/A 04/17/2016   Procedure: Left Heart Cath and Coronary Angiography;  Surgeon: MWellington Hampshire MD;  [facility];  Service: Cardiovascular;  Laterality: N/A;  . CHOLECYSTECTOMY    . COLONOSCOPY N/A 01/20/2020   Procedure: COLONOSCOPY;  Surgeon: LLesly Rubenstein MD;  Location: [facility];  Service: Endoscopy;  Laterality: N/A;  . COLONOSCOPY WITH PROPOFOL N/A 01/12/2020   Procedure: COLONOSCOPY WITH PROPOFOL;  Surgeon: Virgel Manifold, MD;  Location: [place];  Service: Endoscopy;  Laterality: N/A;  Diabetic - oral meds sleep apnea  . ELECTROPHYSIOLOGIC STUDY N/A 05/26/2016   Procedure: Cardioversion;  Surgeon: Wellington Hampshire, MD;  Location: [facility];  Service: Cardiovascular;  Laterality: N/A;  . ESOPHAGOGASTRODUODENOSCOPY (EGD) WITH PROPOFOL N/A 01/12/2020   Procedure: ESOPHAGOGASTRODUODENOSCOPY (EGD) WITH PROPOFOL;  Surgeon: Virgel Manifold, MD;  Location: [place];  Service: Endoscopy;  Laterality: N/A;  . POLYPECTOMY N/A 01/12/2020   Procedure: POLYPECTOMY;  Surgeon: Virgel Manifold, MD;  Location: [place];   Service: Endoscopy;  Laterality: N/A;    Current Outpatient Medications  Medication Sig Dispense Refill  . apixaban (ELIQUIS) 5 MG TABS tablet Take 1 tablet (5 mg total) by mouth 2 (two) times daily. 180 tablet 1  . blood glucose meter kit and supplies KIT Dispense based on patient and insurance preference. Use 1-2 times per day to test BS 1 each 0  . blood glucose meter kit and supplies Dispense based on patient and insurance preference. Use up to four times daily as directed. (FOR ICD-10 E10.9, E11.9). 1 each 0  . Cholecalciferol (VITAMIN D3) 1000 units CAPS Take 1 capsule by mouth 2 (two) times daily.     [name] CRANBERRY PO Take 1 capsule by mouth daily as needed. Soft gel     . digoxin (LANOXIN) 0.25 MG tablet Take 1 tablet (0.25 mg total) by mouth daily. 30 tablet 11  . furosemide (LASIX) 20 MG tablet Take 20 mg by mouth daily as needed for fluid or edema.     Laurina Bustle Oil OIL Apply 1 application topically daily as needed.     Koren Bound Oil OIL Take 1 application by mouth daily as needed.     Cindy Hazy Oil OIL Apply 1 application topically daily as needed.     [name] lisinopril (ZESTRIL) 5 MG tablet Take 1 tablet (5 mg total) by mouth daily. 30 tablet 3  . MAGNESIUM GLUCONATE PO Take 50 mg by mouth 4 (four) times daily.    . metFORMIN (GLUCOPHAGE-XR) 500 MG 24 hr tablet TAKE 1 TABLET BY MOUTH TWICE DAILY BEFORE MEAL(S) 180 tablet 0  . metoprolol tartrate (LOPRESSOR) 25 MG tablet Take 1 tablet (25 mg total) by mouth in the morning, at noon, and at bedtime. 60 tablet 2  . midodrine (PROAMATINE) 5 MG tablet Take 1 tablet (5 mg) by mouth three times a day at 6 am, 10 am, & 2 pm  2  . Multiple Vitamins-Minerals (MULTIVITAMIN WITH MINERALS) tablet Take 1 tablet by mouth daily.     . OIL OF OREGANO PO Take 1 application by mouth daily. To left big toe     . peppermint oil liquid Apply 1 application topically daily as needed.     . vitamin C (ASCORBIC ACID) 500 MG tablet Take 500 mg by mouth daily.       [name] zinc gluconate 50 MG tablet Take 50 mg by mouth daily.     No current facility-administered medications for this visit.    No Known Allergies    Review of Systems negative except from HPI and PMH  Physical Exam BP 100/64   Pulse 78   Ht '5\' 7"'  (1.702 m)   Wt 190 lb 9.6 oz (86.5 kg)   SpO2 95%  BMI 29.85 kg/m  Well developed and nourished in no acute distress HENT normal Neck supple with JVP-flat Carotids brisk and full without bruits Clear Irregularly irregular rate and rhythm with controlled  ventricular response, no murmurs or gallops Abd-soft with active BS without hepatomegaly No Clubbing cyanosis edema Skin-warm and dry A & Oriented  Grossly normal sensory and motor function  ECG demonstrates atrial fibrillation at 78 Intervals-/09/34 ST-T changes consistent with in affect   Assessment and  Plan  Atrial fibrillation-permanent  Cardiomyopathy-- nonischemic-rate related resolved  High Risk Medication Surveillance-dig  Hypertension  Anemia  Obesity resolving  Atrial fibrillation is permanent.  Rate control seems adequate.  We discussed the issue of plethysmography as a tool for measuring heart rate excursion.  She will let us know if her heart rates with exercise are the 130s-40s at which time either Zio patch or treadmill may be of value to assess heart rate excursion.  Given normalization of her LV function, will discontinue her digoxin.  We will decrease her lisinopril from 5--2.5 and increase her metoprolol from 25--50 to allow for adequate rate control.  She has been anemic.  We will reassess her IMA globin today and this may inform recommendations regarding iron.  I have asked her to decrease her Glucophage from 500--250 twice daily as with her weight loss she may well become nondiabetic.  Blood pressure is well controlled indeed low.

## 2020-04-07 NOTE — Patient Instructions (Signed)
Medication Instructions:  Your physician has recommended you make the following change in your medication:   1. STOP: digoxin  2. DECREASE: lisinopril to 2.5 mg once a day  3. DECREASE: metformin to 250 mg twice a day  4. INCREASE: metoprolol tartrate to 50 mg twice a day  5. DECREASE: midodrine to 2.5 mg three times a day   *If you need a refill on your cardiac medications before your next appointment, please call your pharmacy*   Lab Work: TODAY: CBC  If you have labs (blood work) drawn today and your tests are completely normal, you will receive your results only by:  [place] (if you have MyChart) OR  A paper copy in the mail If you have any lab test that is abnormal or we need to change your treatment, we will call you to review the results.   Testing/Procedures: None  Follow-Up: At [facility], you and your health needs are our priority.  As part of our continuing mission to provide you with exceptional heart care, we have created designated Provider Care Teams.  These Care Teams include your primary Cardiologist (physician) and Advanced Practice Providers (APPs -  Physician Assistants and Nurse Practitioners) who all work together to provide you with the care you need, when you need it.  We recommend signing up for the patient portal called "MyChart".  Sign up information is provided on this After Visit Summary.  MyChart is used to connect with patients for Virtual Visits (Telemedicine).  Patients are able to view lab/test results, encounter notes, upcoming appointments, etc.  Non-urgent messages can be sent to your provider as well.   To learn more about what you can do with MyChart, go to [email or web address].    Your next appointment:   6 month(s)  The format for your next appointment:   In Person  Provider:   You may see Jolyn Nap, MD or one of the following Advanced Practice Providers on your designated Care Team:    Chanetta Marshall, NP  Tommye Standard, [place]  Legrand Como "Jonni Sanger" Datto, [place]    Other Instructions None

## 2020-04-12 ENCOUNTER — Other Ambulatory Visit: Payer: Self-pay | Admitting: Internal Medicine

## 2020-04-12 NOTE — Telephone Encounter (Signed)
Requested medication (s) are due for refill today: na  Requested medication (s) are on the active medication list: no  Last refill:  na  Future visit scheduled: yes  Notes to clinic: Rx not on medication list for specific lancets and test strip supplies. Do you want to order onetouch delica lancet 69I mis and onetouch verico in vitro strips?     Requested Prescriptions  Pending Prescriptions Disp Refills   Lancets (ONETOUCH DELICA PLUS [id number]) [place] [Pharmacy Med Name: ONETOUCH DELICA LAN 03K MIS] 917 each 0    Sig: UP TO 4 TIMES DAILY AS DIRECTED      Endocrinology: Diabetes - Testing Supplies Passed - 04/12/2020  1:09 PM      Passed - Valid encounter within last 12 months    Recent Outpatient Visits           2 months ago Type II diabetes mellitus with complication [facility])   [facility] Glean Hess, MD   5 months ago Type II diabetes mellitus with complication [facility])   [facility] Glean Hess, MD   5 months ago Persistent atrial fibrillation [facility])   [facility] Glean Hess, MD       Future Appointments             In 2 months Army Melia Jesse Sans, MD [facility] [place] test strip [place] Med Name: OneTouch Verio In Vitro Strip] 100 each 0    Sig: USE UP TO 4 TIMES DAILY AS DIRECTED      Endocrinology: Diabetes - Testing Supplies Passed - 04/12/2020  1:09 PM      Passed - Valid encounter within last 12 months    Recent Outpatient Visits           2 months ago Type II diabetes mellitus with complication [facility])   [facility] Glean Hess, MD   5 months ago Type II diabetes mellitus with complication [facility])   [facility] Glean Hess, MD   5 months ago Persistent atrial fibrillation [facility])   [facility] Glean Hess, MD       Future Appointments             In 2 months Army Melia Jesse Sans, MD [facility], [facility]

## 2020-04-13 ENCOUNTER — Other Ambulatory Visit: Payer: Self-pay | Admitting: Internal Medicine

## 2020-04-13 MED ORDER — MIDODRINE HCL 2.5 MG PO TABS: ORAL_TABLET | ORAL | 2 refills | Status: DC

## 2020-04-13 NOTE — Telephone Encounter (Signed)
This is a [place] pt 

## 2020-04-13 NOTE — Telephone Encounter (Signed)
*  STAT* If patient is at the pharmacy, call can be transferred to refill team.   1. Which medications need to be refilled? (please list name of each medication and dose if known)  midodrine (PROAMATINE) 2.5 MG tablet  2. Which pharmacy/location (including street and city if local pharmacy) is medication to be sent to? [place], [place]  3. Do they need a 30 day or 90 day supply? 90 day

## 2020-04-13 NOTE — Telephone Encounter (Signed)
Requested Prescriptions   Signed Prescriptions Disp Refills   midodrine (PROAMATINE) 2.5 MG tablet 270 tablet 2    Sig: Take 1 tablet (2.5 mg) by mouth three times a day at 6 am, 10 am, & 2 pm    Authorizing Provider: Deboraha Sprang    Ordering User: Britt Bottom

## 2020-04-20 DIAGNOSIS — H524 Presbyopia: Secondary | ICD-10-CM | POA: Diagnosis not present

## 2020-04-29 DIAGNOSIS — Z9989 Dependence on other enabling machines and devices: Secondary | ICD-10-CM | POA: Diagnosis not present

## 2020-04-29 DIAGNOSIS — Z7984 Long term (current) use of oral hypoglycemic drugs: Secondary | ICD-10-CM | POA: Diagnosis not present

## 2020-04-29 DIAGNOSIS — I4891 Unspecified atrial fibrillation: Secondary | ICD-10-CM | POA: Diagnosis not present

## 2020-04-29 DIAGNOSIS — Z79899 Other long term (current) drug therapy: Secondary | ICD-10-CM | POA: Diagnosis not present

## 2020-04-29 DIAGNOSIS — K635 Polyp of colon: Secondary | ICD-10-CM | POA: Diagnosis not present

## 2020-04-29 DIAGNOSIS — D122 Benign neoplasm of ascending colon: Secondary | ICD-10-CM | POA: Diagnosis not present

## 2020-04-29 DIAGNOSIS — E119 Type 2 diabetes mellitus without complications: Secondary | ICD-10-CM | POA: Diagnosis not present

## 2020-04-29 DIAGNOSIS — Z7901 Long term (current) use of anticoagulants: Secondary | ICD-10-CM | POA: Diagnosis not present

## 2020-04-29 DIAGNOSIS — G4730 Sleep apnea, unspecified: Secondary | ICD-10-CM | POA: Diagnosis not present

## 2020-04-29 DIAGNOSIS — I509 Heart failure, unspecified: Secondary | ICD-10-CM | POA: Diagnosis not present

## 2020-04-29 DIAGNOSIS — D126 Benign neoplasm of colon, unspecified: Secondary | ICD-10-CM | POA: Diagnosis not present

## 2020-05-04 ENCOUNTER — Encounter: Payer: Medicare Other | Attending: Internal Medicine | Admitting: Dietician

## 2020-05-04 ENCOUNTER — Encounter: Payer: Self-pay | Admitting: Dietician

## 2020-05-04 ENCOUNTER — Other Ambulatory Visit: Payer: Self-pay

## 2020-05-04 VITALS — Ht 67.0 in | Wt 194.8 lb

## 2020-05-04 DIAGNOSIS — E119 Type 2 diabetes mellitus without complications: Secondary | ICD-10-CM | POA: Insufficient documentation

## 2020-05-04 DIAGNOSIS — Z01419 Encounter for gynecological examination (general) (routine) without abnormal findings: Secondary | ICD-10-CM | POA: Diagnosis not present

## 2020-05-04 NOTE — Progress Notes (Signed)
Medical Nutrition Therapy: Visit start time: 1000  end time: 1030  Assessment:  Diagnosis: Type 2 DM Medical history changes: recent removal of colon polyp Psychosocial issues/ stress concerns: none  Current weight: 194.8lbs Height: 5'7" Medications, supplement changes: reconciled list in medical record  Progress and evaluation:  . Patient reports fasting BGs ranging 97-123. She denies any hypoglycemia. Has occasionally felt like "needing to eat"  . Had polyp removal last week and sipped on regular soda  . She has now lost close to 80lbs in the past 6 months, since diabetes diagnosis.  . Due to HTN and recent detection of protein in urine, she is following a low sodium diet and restricting phosphorus from food additives-- has not had processed meats or canned soups. Eats frozen veg. Makes homemade soups. Uses pink salt d/t 1/3 less sodium.    Physical activity: walking, gradually resuming after surgical procedure  Dietary Intake:  Usual eating pattern includes 3 meals and 2-3 snacks per day. Dining out frequency:   Breakfast: yogurt with granola (usually also blueberries, flax seed, bran buds); cottage cheese; oatmeal when off work Snack: apple with crunchy peanut butter Lunch: leftovers; cottage cheese if not at breakfast Snack: cheese and crackers; mandarin oranges with pecans Supper: (eating earlier than in past, done by 7:30); soup; protein + brown rice + veg Snack: occ spoonful peanut butter; pecans Beverages: water, coffee in am with creamer (reduced portion)  Nutrition Care Education: Topics covered:     Weight control: reviewed progress since previous visit; general intuitive eating principles; appropriate nutritional balance Diabetes:  healthy carb choices, role of fiber, protein Renal health:  identifying high sodium foods, goals for sodium intake and low sodium options; identifying food sources of phosphorus  Nutritional Diagnosis:  [place]-2.2 Altered nutrition-related  laboratory As related to Type 2 diabetes.  As evidenced by patient with recent HbA1C of 6.5, improved from 12.0% in 6 months.  Intervention:  . Instruction and discussion as noted above. . Patient is doing well with managing her diet, she continues to lose weight and control food portions and choices.  . Her success so far has motivated her to continue with her healthy habits.  . No additional follow up needed at this time; patient will schedule later if needed.    Learner/ who was taught:  . Patient   Level of understanding: [name] Verbalizes/ demonstrates competency   Demonstrated degree of understanding via:   Teach back Learning barriers: . None  Willingness to learn/ readiness for change: . Eager, change in progress   Monitoring and Evaluation:  Dietary intake, exercise, BG control, renal health, and body weight      follow up: prn

## 2020-05-12 DIAGNOSIS — K635 Polyp of colon: Secondary | ICD-10-CM | POA: Diagnosis not present

## 2020-05-12 DIAGNOSIS — K921 Melena: Secondary | ICD-10-CM | POA: Diagnosis not present

## 2020-05-12 DIAGNOSIS — E119 Type 2 diabetes mellitus without complications: Secondary | ICD-10-CM | POA: Diagnosis not present

## 2020-05-12 DIAGNOSIS — R710 Precipitous drop in hematocrit: Secondary | ICD-10-CM | POA: Diagnosis not present

## 2020-05-12 DIAGNOSIS — Z7901 Long term (current) use of anticoagulants: Secondary | ICD-10-CM | POA: Diagnosis not present

## 2020-05-12 DIAGNOSIS — Z9049 Acquired absence of other specified parts of digestive tract: Secondary | ICD-10-CM | POA: Diagnosis not present

## 2020-05-12 DIAGNOSIS — K922 Gastrointestinal hemorrhage, unspecified: Secondary | ICD-10-CM | POA: Diagnosis not present

## 2020-05-12 DIAGNOSIS — D62 Acute posthemorrhagic anemia: Secondary | ICD-10-CM | POA: Diagnosis not present

## 2020-05-12 DIAGNOSIS — Z20822 Contact with and (suspected) exposure to covid-19: Secondary | ICD-10-CM | POA: Diagnosis not present

## 2020-05-12 DIAGNOSIS — K5730 Diverticulosis of large intestine without perforation or abscess without bleeding: Secondary | ICD-10-CM | POA: Diagnosis not present

## 2020-05-12 DIAGNOSIS — I10 Essential (primary) hypertension: Secondary | ICD-10-CM | POA: Diagnosis not present

## 2020-05-12 DIAGNOSIS — I4821 Permanent atrial fibrillation: Secondary | ICD-10-CM | POA: Diagnosis not present

## 2020-05-12 DIAGNOSIS — I4891 Unspecified atrial fibrillation: Secondary | ICD-10-CM | POA: Diagnosis not present

## 2020-05-12 DIAGNOSIS — K625 Hemorrhage of anus and rectum: Secondary | ICD-10-CM | POA: Diagnosis not present

## 2020-05-12 DIAGNOSIS — Z7984 Long term (current) use of oral hypoglycemic drugs: Secondary | ICD-10-CM | POA: Diagnosis not present

## 2020-05-13 DIAGNOSIS — K625 Hemorrhage of anus and rectum: Secondary | ICD-10-CM | POA: Diagnosis not present

## 2020-05-13 DIAGNOSIS — I4821 Permanent atrial fibrillation: Secondary | ICD-10-CM | POA: Diagnosis not present

## 2020-05-18 ENCOUNTER — Other Ambulatory Visit (INDEPENDENT_AMBULATORY_CARE_PROVIDER_SITE_OTHER): Payer: Medicare Other | Admitting: Vascular Surgery

## 2020-05-21 ENCOUNTER — Other Ambulatory Visit (INDEPENDENT_AMBULATORY_CARE_PROVIDER_SITE_OTHER): Payer: Medicare Other | Admitting: Vascular Surgery

## 2020-05-24 ENCOUNTER — Other Ambulatory Visit: Payer: Self-pay | Admitting: Internal Medicine

## 2020-05-24 ENCOUNTER — Encounter: Payer: Self-pay | Admitting: Internal Medicine

## 2020-05-24 ENCOUNTER — Encounter (INDEPENDENT_AMBULATORY_CARE_PROVIDER_SITE_OTHER): Payer: Medicare Other

## 2020-05-24 ENCOUNTER — Ambulatory Visit (INDEPENDENT_AMBULATORY_CARE_PROVIDER_SITE_OTHER): Payer: Medicare Other | Admitting: Internal Medicine

## 2020-05-24 ENCOUNTER — Other Ambulatory Visit: Payer: Self-pay

## 2020-05-24 VITALS — BP 116/78 | HR 84 | Ht 67.0 in | Wt 195.0 lb

## 2020-05-24 DIAGNOSIS — D6869 Other thrombophilia: Secondary | ICD-10-CM | POA: Diagnosis not present

## 2020-05-24 DIAGNOSIS — I4819 Other persistent atrial fibrillation: Secondary | ICD-10-CM

## 2020-05-24 DIAGNOSIS — R809 Proteinuria, unspecified: Secondary | ICD-10-CM | POA: Diagnosis not present

## 2020-05-24 DIAGNOSIS — B070 Plantar wart: Secondary | ICD-10-CM

## 2020-05-24 DIAGNOSIS — R609 Edema, unspecified: Secondary | ICD-10-CM

## 2020-05-24 DIAGNOSIS — Z8719 Personal history of other diseases of the digestive system: Secondary | ICD-10-CM

## 2020-05-24 DIAGNOSIS — E118 Type 2 diabetes mellitus with unspecified complications: Secondary | ICD-10-CM

## 2020-05-24 MED ORDER — FUROSEMIDE 20 MG PO TABS: 20.0000 mg | ORAL_TABLET | Freq: Every day | ORAL | 0 refills | Status: DC | PRN

## 2020-05-24 NOTE — Progress Notes (Signed)
Date:  05/24/2020   Name:  Brandy [name]   DOB:  1955/08/31   MRN:  [id number]   Chief Complaint: Hospitalization Follow-up and Proteinuria (Wants to recheck her urine since she has been taking her lisinopril since September.) Hospital follow up.  Admitted to [facility] 05/12/20 to 05/13/20 for GI bleed after a colonoscopy with polypectomy. She was monitored overnight and had colonoscopy planned for the next day but this was deferred when bleeding stopped. She did not receive a TOC call.  Discharge Diagnoses:  Principal Problem: Colonoscopy causing post-procedural bleeding POA: Unknown Active Problems: T2DM (type 2 diabetes mellitus) (CMS-HCC) POA: Unknown Permanent atrial fibrillation (CMS-HCC) POA: Unknown Gastrointestinal hemorrhage associated with anorectal source POA: Unknown Anticoagulated POA: Not Applicable Hypertension POA: Unknown  Discharge plan: [facility]  S/p polypectomy on 12/16 of several large polyps > 5cm, now presenting with bright/maroon colored stool after ramping back up to her full dose eliquis in recent days PTA. Had a similar presentation after polypectomy to OSH about 3 months ago when her hemoglobin dropped to 7. Reassuringly vitals remained stable . Initial plan was to trend H/H overnight with no planned intervention, however she had additional bleeding in ED and Hemoglobin down to 10.4 from 13.5, so she underwent bowel prep in anticipation for colonoscopy. Bleeding resolved with her prep and hemoglobin the following day returned to > 13, so colonoscopy was deferred. This now represents her 2nd admission for GI bleeding while on anticoagulation. She was advised to hold A/c for 7 more days then resume eliquis at 2.5mg  until she sees cardiology. Arranged for close cardiology follow up in 2 weeks to discuss anticoagulation further  >>> pt denies further bleeding since discharge.  Titrating back up on eliquis - now on half dose.   Diabetes She presents for her follow-up diabetic  visit. She has type 2 diabetes mellitus. Pertinent negatives for hypoglycemia include no headaches or tremors. Pertinent negatives for diabetes include no chest pain, no fatigue, no polydipsia and no polyuria. Current diabetic treatment includes oral agent (monotherapy). She is compliant with treatment all of the time. An ACE inhibitor/angiotensin II receptor blocker is being taken.  Foot Injury  There was no injury mechanism. The pain is present in the left foot. Pain severity now: callus or wart causing pressure. Pertinent negatives include no numbness. The symptoms are aggravated by weight bearing.    Lab Results  Component Value Date   CREATININE 0.78 01/24/2020   BUN 11 01/24/2020   NA 139 01/24/2020   K 3.7 01/24/2020   CL 105 01/24/2020   CO2 26 01/24/2020   Lab Results  Component Value Date   CHOL 191 10/31/2016   HDL 41.30 10/31/2016   LDLDIRECT 131.0 10/31/2016   TRIG 216.0 (H) 10/31/2016   CHOLHDL 5 10/31/2016   Lab Results  Component Value Date   TSH 1.880 10/20/2019   Lab Results  Component Value Date   HGBA1C 6.5 (H) 01/19/2020   Lab Results  Component Value Date   WBC 10.1 04/07/2020   HGB 14.6 04/07/2020   HCT 42.1 04/07/2020   MCV 88 04/07/2020   PLT 308 04/07/2020   Lab Results  Component Value Date   ALT 28 01/18/2020   AST 27 01/18/2020   ALKPHOS 72 01/18/2020   BILITOT 0.7 01/18/2020     Review of Systems  Constitutional: Negative for appetite change, fatigue, fever and unexpected weight change.  HENT: Negative for tinnitus and trouble swallowing.   Eyes: Negative for visual  disturbance.  Respiratory: Negative for cough, chest tightness and shortness of breath.   Cardiovascular: Negative for chest pain, palpitations and leg swelling.  Gastrointestinal: Negative for abdominal pain.  Endocrine: Negative for polydipsia and polyuria.  Genitourinary: Negative for dysuria and hematuria.  Musculoskeletal: Positive for arthralgias (left foot - ball  of foot uncomfortable).  Neurological: Negative for tremors, numbness and headaches.  Psychiatric/Behavioral: Negative for dysphoric mood.    Patient Active Problem List   Diagnosis Date Noted  . Acquired thrombophilia ([place]) 05/24/2020  . Morbid (severe) obesity due to excess calories ([place]) 02/06/2020  . Hypotension 01/24/2020  . Chronic anticoagulation 01/20/2020  . Acute lower GI bleeding 01/19/2020  . Atrial fibrillation, chronic ([place]) 01/19/2020  . Diabetes mellitus type 2 in obese ([place]) 01/19/2020  . Gastric polyp   . Stomach irritation   . Special screening for malignant neoplasms, colon   . Colon polyps   . Nasal sinus congestion 10/20/2019  . Weight loss, non-intentional 10/20/2019  . Hyperglycemia 10/20/2019  . Varicose veins of both lower extremities 10/20/2019  . Dysphagia 10/20/2019  . Osteoarthritis of right hand 10/31/2016  . Persistent atrial fibrillation ([place])   . Atrial fibrillation with rapid ventricular response ([place])   . Nonischemic cardiomyopathy ([place]) 04/14/2016  . BMI 33.0-33.9,adult 04/14/2016    No Known Allergies  Past Surgical History:  Procedure Laterality Date  . BIOPSY N/A 01/12/2020   Procedure: BIOPSY;  Surgeon: Virgel Manifold, MD;  Location: [place];  Service: Endoscopy;  Laterality: N/A;  . CARDIAC CATHETERIZATION N/A 04/17/2016   Procedure: Left Heart Cath and Coronary Angiography;  Surgeon: Wellington Hampshire, MD;  Location: [place] CV LAB;  Service: Cardiovascular;  Laterality: N/A;  . CHOLECYSTECTOMY    . COLONOSCOPY N/A 01/20/2020   Procedure: COLONOSCOPY;  Surgeon: Lesly Rubenstein, MD;  Location: [facility] ENDOSCOPY;  Service: Endoscopy;  Laterality: N/A;  . COLONOSCOPY WITH PROPOFOL N/A 01/12/2020   Procedure: COLONOSCOPY WITH PROPOFOL;  Surgeon: Virgel Manifold, MD;  Location: [place];  Service: Endoscopy;  Laterality: N/A;  Diabetic - oral meds sleep apnea  . ELECTROPHYSIOLOGIC STUDY N/A 05/26/2016    Procedure: Cardioversion;  Surgeon: Wellington Hampshire, MD;  Location: [facility];  Service: Cardiovascular;  Laterality: N/A;  . ESOPHAGOGASTRODUODENOSCOPY (EGD) WITH PROPOFOL N/A 01/12/2020   Procedure: ESOPHAGOGASTRODUODENOSCOPY (EGD) WITH PROPOFOL;  Surgeon: Virgel Manifold, MD;  Location: [place];  Service: Endoscopy;  Laterality: N/A;  . POLYPECTOMY N/A 01/12/2020   Procedure: POLYPECTOMY;  Surgeon: Virgel Manifold, MD;  Location: [place];  Service: Endoscopy;  Laterality: N/A;    Social History   Tobacco Use  . Smoking status: Never Smoker  . Smokeless tobacco: Never Used  Vaping Use  . Vaping Use: Never used  Substance Use Topics  . Alcohol use: Not Currently  . Drug use: No     Medication list has been reviewed and updated.  Current Meds  Medication Sig  . apixaban (ELIQUIS) 5 MG TABS tablet Take 1 tablet (5 mg total) by mouth 2 (two) times daily. (Patient taking differently: Take 2.5 mg by mouth 2 (two) times daily.)  . Cholecalciferol (VITAMIN D3) 1000 units CAPS Take 1 capsule by mouth 2 (two) times daily.   . furosemide (LASIX) 20 MG tablet Take 20 mg by mouth daily as needed for fluid or edema.   . Lancets (ONETOUCH DELICA PLUS [id number]) MISC UP TO 4 TIMES DAILY AS DIRECTED  . Lavender Oil OIL Apply 1 application  topically daily as needed.   Koren Bound Oil OIL Take 1 application by mouth daily as needed.   Cindy Hazy Oil OIL Apply 1 application topically daily as needed.   [name] lisinopril (ZESTRIL) 2.5 MG tablet Take 1 tablet (2.5 mg total) by mouth daily.  [name] MAGNESIUM GLUCONATE PO Take 50 mg by mouth 4 (four) times daily.  . metFORMIN (GLUCOPHAGE-XR) 500 MG 24 hr tablet Take 1/2 tablet by mouth twice a day  . metoprolol tartrate (LOPRESSOR) 50 MG tablet Take 1 tablet (50 mg total) by mouth 2 (two) times daily.  . midodrine (PROAMATINE) 2.5 MG tablet Take 1 tablet (2.5 mg) by mouth three times a day at 6 am, 10 am, & 2 pm  . Multiple  Vitamins-Minerals (MULTIVITAMIN WITH MINERALS) tablet Take 1 tablet by mouth daily.   . OIL OF OREGANO PO Take 1 application by mouth daily. To left big toe   . ONETOUCH VERIO test strip USE UP TO 4 TIMES DAILY AS DIRECTED  . peppermint oil liquid Apply 1 application topically daily as needed.   . vitamin C (ASCORBIC ACID) 500 MG tablet Take 500 mg by mouth daily.   [name] zinc gluconate 50 MG tablet Take 50 mg by mouth daily.    PHQ 2/9 Scores 05/24/2020 11/25/2019 10/24/2019 10/20/2019  PHQ - 2 Score 0 0 0 0  PHQ- 9 Score 0 - 0 0    GAD 7 : Generalized Anxiety Score 05/24/2020 10/24/2019 10/20/2019  Nervous, Anxious, on Edge 0 1 1  Control/stop worrying 0 0 0  Worry too much - different things 0 0 0  Trouble relaxing 0 0 0  Restless 0 0 0  Easily annoyed or irritable 0 0 0  Afraid - awful might happen 0 0 0  Total GAD 7 Score 0 1 1  Anxiety Difficulty Not difficult at all Not difficult at all Not difficult at all    BP Readings from Last 3 Encounters:  05/24/20 116/78  04/07/20 100/64  02/06/20 122/82    Physical Exam Vitals and nursing note reviewed.  Constitutional:      General: She is not in acute distress.    Appearance: Normal appearance. She is well-developed.  HENT:     Head: Normocephalic and atraumatic.  Cardiovascular:     Rate and Rhythm: Rhythm irregular.     Pulses: Normal pulses.  Pulmonary:     Effort: Pulmonary effort is normal. No respiratory distress.     Breath sounds: No wheezing or rhonchi.  Musculoskeletal:        General: Normal range of motion.     Cervical back: Normal range of motion.     Right lower leg: Edema present.     Left lower leg: Edema present.       Feet:  Feet:     Comments: Warty lesion   Lymphadenopathy:     Cervical: No cervical adenopathy.  Skin:    General: Skin is warm and dry.     Findings: No rash.  Neurological:     General: No focal deficit present.     Mental Status: She is alert and oriented to person, place, and  time.  Psychiatric:        Mood and Affect: Mood and affect and mood normal.     Wt Readings from Last 3 Encounters:  05/24/20 195 lb (88.5 kg)  05/04/20 194 lb 12.8 oz (88.4 kg)  04/07/20 190 lb 9.6 oz (86.5 kg)    BP  116/78   Pulse 84   Ht 5\' 7"  (1.702 m)   Wt 195 lb (88.5 kg)   SpO2 99%   BMI 30.54 kg/m   Assessment and Plan: 1. History of lower GI bleeding Now resolved; resumed 1/2 dose Eliquis - CBC with Differential/Platelet  2. Acquired thrombophilia ([place]) On Eliquis  3. Proteinuria, unspecified type Tolerating low dose ACEI for previous proteinuria - Microalbumin / creatinine urine ratio  4. Type II diabetes mellitus with complication (HCC) Clinically stable by exam and report without s/s of hypoglycemia. DM complicated by CAD. Tolerating medications - metformin 250 mg bid well without side effects or other concerns. - Hemoglobin A1c  5. Persistent atrial fibrillation (HCC) titrating back up on Eliquis Seeing Cardiology later this week  6. Dependent edema Continue lasix PRN - furosemide (LASIX) 20 MG tablet; Take 1 tablet (20 mg total) by mouth daily as needed for fluid or edema.  Dispense: 30 tablet; Refill: 0  7. Plantar wart of left foot - Ambulatory referral to Podiatry   Partially dictated using Editor, commissioning. Any errors are unintentional.  Halina Maidens, MD [facility]  05/24/2020

## 2020-05-25 ENCOUNTER — Ambulatory Visit (INDEPENDENT_AMBULATORY_CARE_PROVIDER_SITE_OTHER): Payer: Medicare Other | Admitting: Vascular Surgery

## 2020-05-25 ENCOUNTER — Encounter (INDEPENDENT_AMBULATORY_CARE_PROVIDER_SITE_OTHER): Payer: Self-pay | Admitting: Vascular Surgery

## 2020-05-25 VITALS — BP 105/69 | HR 101 | Resp 16 | Wt 192.4 lb

## 2020-05-25 DIAGNOSIS — I8312 Varicose veins of left lower extremity with inflammation: Secondary | ICD-10-CM

## 2020-05-25 DIAGNOSIS — I8311 Varicose veins of right lower extremity with inflammation: Secondary | ICD-10-CM | POA: Diagnosis not present

## 2020-05-25 LAB — HEMOGLOBIN A1C
Est. average glucose Bld gHb Est-mCnc: 128 mg/dL
Hgb A1c MFr Bld: 6.1 % — ABNORMAL HIGH (ref 4.8–5.6)

## 2020-05-25 LAB — CBC WITH DIFFERENTIAL/PLATELET
Basophils Absolute: 0.1 10*3/uL (ref 0.0–0.2)
Basos: 1 %
EOS (ABSOLUTE): 0.2 10*3/uL (ref 0.0–0.4)
Eos: 3 %
Hematocrit: 36.6 % (ref 34.0–46.6)
Hemoglobin: 12 g/dL (ref 11.1–15.9)
Immature Grans (Abs): 0 10*3/uL (ref 0.0–0.1)
Immature Granulocytes: 0 %
Lymphocytes Absolute: 3.8 10*3/uL — ABNORMAL HIGH (ref 0.7–3.1)
Lymphs: 46 %
MCH: 28.3 pg (ref 26.6–33.0)
MCHC: 32.8 g/dL (ref 31.5–35.7)
MCV: 86 fL (ref 79–97)
Monocytes Absolute: 0.8 10*3/uL (ref 0.1–0.9)
Monocytes: 10 %
Neutrophils Absolute: 3.2 10*3/uL (ref 1.4–7.0)
Neutrophils: 40 %
Platelets: 336 10*3/uL (ref 150–450)
RBC: 4.24 x10E6/uL (ref 3.77–5.28)
RDW: 14.8 % (ref 11.7–15.4)
WBC: 8 10*3/uL (ref 3.4–10.8)

## 2020-05-25 LAB — MICROALBUMIN / CREATININE URINE RATIO
Creatinine, Urine: 13.6 mg/dL
Microalb/Creat Ratio: <22 mg/g creat (ref 0–29)
Microalbumin, Urine: <3 ug/mL

## 2020-05-25 NOTE — Progress Notes (Signed)
Brandy [name] is a 65 y.o. female who presents with symptomatic venous reflux  Past Medical History:  Diagnosis Date  . Chicken pox   . Chronic systolic CHF (congestive heart failure) ([place])    a. 03/2016 Echo: Ef 15-20%, diff HK, ant AK;  b. 05/2016 Echo: Ef 30-35%, diff HK, mildly dil LA/RA.  . Diabetes mellitus without complication ([place])   . GERD (gastroesophageal reflux disease)   . Heart murmur   . Hip discomfort    been going on for 40 years   . History of kidney stones   . Moderate mitral regurgitation    a. 03/2016 Echo: mod MR in setting of LV dysfxn.  . Motion sickness    car - back seat  . NICM (nonischemic cardiomyopathy) ([place])    a. 03/2016 Echo: EF 15-20%, diff HK, ant AK, mod MR, mod dil LA, mildly dil RA;  b. 04/2016 Cath: nl cors;  c. 05/2016 Echo: EF 30-35%, diff HK.  . Obesity   . Obstructive sleep apnea    compliant with CPAP  . Persistent atrial fibrillation ([place])    a. Dx 03/2016;  b. CHA2DS2VASc = 2-->Eliquis 5mg  BID;  b. 05/2016 Failed DCCV x 4.  . Sleep apnea   . Visit for monitoring Tikosyn therapy 07/24/2016    Past Surgical History:  Procedure Laterality Date  . BIOPSY N/A 01/12/2020   Procedure: BIOPSY;  Surgeon: Virgel Manifold, MD;  Location: [place];  Service: Endoscopy;  Laterality: N/A;  . CARDIAC CATHETERIZATION N/A 04/17/2016   Procedure: Left Heart Cath and Coronary Angiography;  Surgeon: Wellington Hampshire, MD;  Location: [place] [facility];  Service: Cardiovascular;  Laterality: N/A;  . CHOLECYSTECTOMY    . COLONOSCOPY N/A 01/20/2020   Procedure: COLONOSCOPY;  Surgeon: Lesly Rubenstein, MD;  Location: [facility] ENDOSCOPY;  Service: Endoscopy;  Laterality: N/A;  . COLONOSCOPY WITH PROPOFOL N/A 01/12/2020   Procedure: COLONOSCOPY WITH PROPOFOL;  Surgeon: Virgel Manifold, MD;  Location: [place];  Service: Endoscopy;  Laterality: N/A;  Diabetic - oral meds sleep apnea  . ELECTROPHYSIOLOGIC STUDY N/A 05/26/2016    Procedure: Cardioversion;  Surgeon: Wellington Hampshire, MD;  Location: [facility] OR[facility];  Service: Cardiovascular;  Laterality: N/A;  . ESOPHAGOGASTRODUODENOSCOPY (EGD) WITH PROPOFOL N/A 01/12/2020   Procedure: ESOPHAGOGASTRODUODENOSCOPY (EGD) WITH PROPOFOL;  Surgeon: Virgel Manifold, MD;  Location: [place];  Service: Endoscopy;  Laterality: N/A;  . POLYPECTOMY N/A 01/12/2020   Procedure: POLYPECTOMY;  Surgeon: Virgel Manifold, MD;  Location: [place];  Service: Endoscopy;  Laterality: N/A;     Current Outpatient Medications:  .  apixaban (ELIQUIS) 5 MG TABS tablet, Take 1 tablet (5 mg total) by mouth 2 (two) times daily. (Patient taking differently: Take 2.5 mg by mouth 2 (two) times daily.), Disp: 180 tablet, Rfl: 1 .  Cholecalciferol (VITAMIN D3) 1000 units CAPS, Take 1 capsule by mouth 2 (two) times daily. , Disp: , Rfl:  .  furosemide (LASIX) 20 MG tablet, Take 1 tablet (20 mg total) by mouth daily as needed for fluid or edema., Disp: 30 tablet, Rfl: 0 .  Lancets (ONETOUCH DELICA PLUS [id number]) MISC, UP TO 4 TIMES DAILY AS DIRECTED, Disp: 100 each, Rfl: 0 .  Lavender Oil OIL, Apply 1 application topically daily as needed. , Disp: , Rfl:  .  Lemon Oil OIL, Take 1 application by mouth daily as needed. , Disp: , Rfl:  .  Lemongrass Oil OIL, Apply 1 application  topically daily as needed. , Disp: , Rfl:  .  lisinopril (ZESTRIL) 2.5 MG tablet, Take 1 tablet (2.5 mg total) by mouth daily., Disp: 90 tablet, Rfl: 3 .  MAGNESIUM GLUCONATE PO, Take 50 mg by mouth 4 (four) times daily., Disp: , Rfl:  .  metFORMIN (GLUCOPHAGE-XR) 500 MG 24 hr tablet, Take 1/2 tablet by mouth twice a day, Disp: 90 tablet, Rfl: 3 .  metoprolol tartrate (LOPRESSOR) 50 MG tablet, Take 1 tablet (50 mg total) by mouth 2 (two) times daily., Disp: 180 tablet, Rfl: 3 .  midodrine (PROAMATINE) 2.5 MG tablet, Take 1 tablet (2.5 mg) by mouth three times a day at 6 am, 10 am, & 2 pm, Disp: 270 tablet, Rfl: 2 .   Multiple Vitamins-Minerals (MULTIVITAMIN WITH MINERALS) tablet, Take 1 tablet by mouth daily. , Disp: , Rfl:  .  OIL OF OREGANO PO, Take 1 application by mouth daily. To left big toe , Disp: , Rfl:  .  ONETOUCH VERIO test strip, USE UP TO 4 TIMES DAILY AS DIRECTED, Disp: 100 each, Rfl: 0 .  peppermint oil liquid, Apply 1 application topically daily as needed. , Disp: , Rfl:  .  vitamin C (ASCORBIC ACID) 500 MG tablet, Take 500 mg by mouth daily. , Disp: , Rfl:  .  zinc gluconate 50 MG tablet, Take 50 mg by mouth daily., Disp: , Rfl:   No Known Allergies   Varicose veins of both lower extremities     PLAN: The patient's left lower extremity was sterilely prepped and draped. The ultrasound machine was used to visualize the saphenous vein throughout its course. A segment in the mid to upper calf was selected for access. The saphenous vein was accessed without difficulty using ultrasound guidance with a micropuncture needle. A 0.018 wire was then placed beyond the saphenofemoral junction and the needle was removed. The 65 cm sheath was then placed over the wire and the wire and dilator were removed. The laser fiber was then placed through the sheath and its tip was placed approximately 5 centimeters below the saphenofemoral junction. Tumescent anesthesia was then created with a dilute lidocaine solution. Laser energy was then delivered with constant withdrawal of the sheath and laser fiber. Approximately 1598 joules of energy were delivered over a length of 39 centimeters using a 1470 Hz VenaCure machine at 7 W. Sterile dressings were placed. The patient tolerated the procedure well without obvious complications.   Follow-up in 1 week with post-laser duplex.

## 2020-05-26 NOTE — Progress Notes (Signed)
Cardiology Office Note Date:  05/26/2020  Patient ID:  Shelbie, Mallette 09/29/1955, MRN [id number] PCP:  Glean Hess, MD  Cardiologist/Electrophysiologist: Dr. Caryl Comes    Chief Complaint:  post hospital  History of Present Illness: HARLEE ABERNATHEY is a 65 y.o. female with history of NICM with improvement in EF with rate control, permanent AFib, DM, OSA w/CPAP.  She comes in today to be seen for Dr. Caryl Comes, last seen by him Nov 2021, at that time doing well, losing weight and exercising.  Getting low BPs He discussed normalization of her LVEF and continued her dig. Her lisinopril reduced as well as her metoprolol. He also reduced her metformin, and planned labs to revisit her anemia.  She was in the ER at [facility] 05/12/20, s/p colonoscopy and removal of several  large polyps on 04/29/20, developed BRBPR. H/H dropped from 13 > 10. Planned for colonoscopy though deferred with no further bleeding She was discharged the folowing day with instructions to hold a/c for 7 days and resume Eliquis at 2.5mg  until seen by cardiology. They note this the 2nd hospitalization for GIB and recommend consideration for watchman/lariat or other intervention.  She saw PMD service 05/24/20, on Eliquis 2.5mg  BID she had not had further bleeding.  TODAY She reports that back in lat Aug had a colonoscopy and some small polyps removed, she was resumed on Eliquis some days later and a few days after that started to bleed. Some time after that back on her usual Eliquis dose. This time again, had gotten the large polyp removed and resumed her Eliquis, she can not recall but was some days after the procedure.  And then perhaps was a week-ish that she noted the bleeding. She did not get transfusion, was planned for repeat colonoscopy to intervene on the bleed though had no further bleeding after the bowel prep.  She has been a week on the low dose Eliquis with no signs of bleeding.  Prior to these events she had not had  any bleeding or issues with her Eliquis.  OUTSIDE of this, otherwise, she feels like the current regime of the lowered lisinopril and metoprolol dose with the midodrine has kept her at a pretty good steady state with her HR and BPs. No near syncope or syncope. She mentions her A1c is now in the 6's and very prod of her accomplishments.    Past Medical History:  Diagnosis Date   Chicken pox    Chronic systolic CHF (congestive heart failure) ([place])    a. 03/2016 Echo: Ef 15-20%, diff HK, ant AK;  b. 05/2016 Echo: Ef 30-35%, diff HK, mildly dil LA/RA.   Diabetes mellitus without complication (HCC)    GERD (gastroesophageal reflux disease)    Heart murmur    Hip discomfort    been going on for 40 years    History of kidney stones    Moderate mitral regurgitation    a. 03/2016 Echo: mod MR in setting of LV dysfxn.   Motion sickness    car - back seat   NICM (nonischemic cardiomyopathy) ([place])    a. 03/2016 Echo: EF 15-20%, diff HK, ant AK, mod MR, mod dil LA, mildly dil RA;  b. 04/2016 Cath: nl cors;  c. 05/2016 Echo: EF 30-35%, diff HK.   Obesity    Obstructive sleep apnea    compliant with CPAP   Persistent atrial fibrillation ([place])    a. Dx 03/2016;  b. CHA2DS2VASc = 2-->Eliquis 5mg  BID;  b. 05/2016 Failed DCCV x 4.   Sleep apnea    Visit for monitoring Tikosyn therapy 07/24/2016    Past Surgical History:  Procedure Laterality Date   BIOPSY N/A 01/12/2020   Procedure: BIOPSY;  Surgeon: Virgel Manifold, MD;  Location: [place];  Service: Endoscopy;  Laterality: N/A;   CARDIAC CATHETERIZATION N/A 04/17/2016   Procedure: Left Heart Cath and Coronary Angiography;  Surgeon: Wellington Hampshire, MD;  Location: [place] [facility];  Service: Cardiovascular;  Laterality: N/A;   CHOLECYSTECTOMY     COLONOSCOPY N/A 01/20/2020   Procedure: COLONOSCOPY;  Surgeon: Lesly Rubenstein, MD;  Location: [facility] ENDOSCOPY;  Service: Endoscopy;  Laterality: N/A;    COLONOSCOPY WITH PROPOFOL N/A 01/12/2020   Procedure: COLONOSCOPY WITH PROPOFOL;  Surgeon: Virgel Manifold, MD;  Location: [place];  Service: Endoscopy;  Laterality: N/A;  Diabetic - oral meds sleep apnea   ELECTROPHYSIOLOGIC STUDY N/A 05/26/2016   Procedure: Cardioversion;  Surgeon: Wellington Hampshire, MD;  Location: [facility];  Service: Cardiovascular;  Laterality: N/A;   ESOPHAGOGASTRODUODENOSCOPY (EGD) WITH PROPOFOL N/A 01/12/2020   Procedure: ESOPHAGOGASTRODUODENOSCOPY (EGD) WITH PROPOFOL;  Surgeon: Virgel Manifold, MD;  Location: [place];  Service: Endoscopy;  Laterality: N/A;   POLYPECTOMY N/A 01/12/2020   Procedure: POLYPECTOMY;  Surgeon: Virgel Manifold, MD;  Location: [place];  Service: Endoscopy;  Laterality: N/A;    Current Outpatient Medications  Medication Sig Dispense Refill   apixaban (ELIQUIS) 5 MG TABS tablet Take 1 tablet (5 mg total) by mouth 2 (two) times daily. (Patient taking differently: Take 2.5 mg by mouth 2 (two) times daily.) 180 tablet 1   Cholecalciferol (VITAMIN D3) 1000 units CAPS Take 1 capsule by mouth 2 (two) times daily.      furosemide (LASIX) 20 MG tablet Take 1 tablet (20 mg total) by mouth daily as needed for fluid or edema. 30 tablet 0   Lancets (ONETOUCH DELICA PLUS [id number]) MISC UP TO 4 TIMES DAILY AS DIRECTED 100 each 0   Lavender Oil OIL Apply 1 application topically daily as needed.      Lemon Oil OIL Take 1 application by mouth daily as needed.      Lemongrass Oil OIL Apply 1 application topically daily as needed.      lisinopril (ZESTRIL) 2.5 MG tablet Take 1 tablet (2.5 mg total) by mouth daily. 90 tablet 3   MAGNESIUM GLUCONATE PO Take 50 mg by mouth 4 (four) times daily.     metFORMIN (GLUCOPHAGE-XR) 500 MG 24 hr tablet Take 1/2 tablet by mouth twice a day 90 tablet 3   metoprolol tartrate (LOPRESSOR) 50 MG tablet Take 1 tablet (50 mg total) by mouth 2 (two) times daily. 180 tablet 3    midodrine (PROAMATINE) 2.5 MG tablet Take 1 tablet (2.5 mg) by mouth three times a day at 6 am, 10 am, & 2 pm 270 tablet 2   Multiple Vitamins-Minerals (MULTIVITAMIN WITH MINERALS) tablet Take 1 tablet by mouth daily.      OIL OF OREGANO PO Take 1 application by mouth daily. To left big toe      ONETOUCH VERIO test strip USE UP TO 4 TIMES DAILY AS DIRECTED 100 each 0   peppermint oil liquid Apply 1 application topically daily as needed.      vitamin C (ASCORBIC ACID) 500 MG tablet Take 500 mg by mouth daily.      zinc gluconate 50 MG tablet Take 50 mg by  mouth daily.     No current facility-administered medications for this visit.    Allergies:   Patient has no known allergies.   Social History:  The patient  reports that she has never smoked. She has never used smokeless tobacco. She reports previous alcohol use. She reports that she does not use drugs.   Family History:  The patient's family history includes COPD in her father and mother; Diabetes in her paternal aunt; Heart disease in her father; Hypertension in her father.  ROS:  Please see the history of present illness.    All other systems are reviewed and otherwise negative.   PHYSICAL EXAM:  VS:  There were no vitals taken for this visit. BMI: There is no height or weight on file to calculate BMI. Well nourished, well developed, in no acute distress HEENT: normocephalic, atraumatic Neck: no JVD, carotid bruits or masses Cardiac:  irreg-irreg; no significant murmurs, no rubs, or gallops Lungs:   CTA b/l, no wheezing, rhonchi or rales Abd: soft, nontender MS: no deformity or atrophy Ext:  no edema Skin: warm and dry, no rash Neuro:  No gross deficits appreciated Psych: euthymic mood, full affect   EKG:  Not done today 04/07/20 reviewed, was AFib 78bpm 05/12/20 in care everywhere is read as Afib 121bpm, unable to see image  12/31/2018; TTE IMPRESSIONS  1. The left ventricle has normal systolic function with an  ejection  fraction of 60-65%. The cavity size was normal. There is mildly increased  left ventricular wall thickness. Left ventricular diastolic function could  not be evaluated secondary to atrial  fibrillation. No evidence of left ventricular regional wall motion  abnormalities.  2. The right ventricle has normal systolic function. The cavity was  normal. There is no increase in right ventricular wall thickness. Right  ventricular systolic pressure is upper normal to mildly elevated (25-30  mmHg plus central venous pressure).  3. Right atrial size was mildly dilated.  4. The aortic valve is tricuspid.  5. The aorta is normal unless otherwise noted.  6. The interatrial septum was not well visualized.     04/17/2016; LHC  LV end diastolic pressure is normal.   1. Normal coronary arteries. 2. Severely dilated left ventricle with Normal left ventricular end-diastolic pressure. Left ventricular angiography was not performed. EF was severely dilated by echo.  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 Continue medical therapy. I am going to start anticoagulation with Eliquis this evening. If ventricular rate is controlled, the patient can be discharged home with rate control and anticoagulation with plans for outpatient cardioversion in 3 weeks. Otherwise, if the rate is not controlled, recommend TEE guided cardioversion tomorrow.    Recent Labs: 10/20/2019: TSH 1.880 01/18/2020: ALT 28 01/24/2020: BUN 11; Creatinine, Ser 0.78; Magnesium 2.4; Potassium 3.7; Sodium 139 05/24/2020: Hemoglobin 12.0; Platelets 336  No results found for requested labs within last 8760 hours.   CrCl cannot be calculated (Patient's most recent lab result is older than the maximum 21 days allowed.).   Wt Readings from Last 3 Encounters:  05/25/20 192 lb 6.4 oz (87.3 kg)  05/24/20 195 lb (88.5 kg)  05/04/20 194 lb 12.8 oz (88.4 kg)      Other studies reviewed: Additional studies/records reviewed today include: summarized above  ASSESSMENT AND PLAN:  1. Permanent AFib     CHA2DS2Vasc is 67. (age, female, DM, h/o NICM)     recurrent GIB      BOTH of her  LGIB occurred s/p polyp removal after restart of her a/c. The 1st about 4-5 days after the procedure and a couple days after restart of her eliquis The 2nd almost 2 weeks after the procedure and a week or so after restart of Eliquis, though (?was a large polyp and she says the GI MD did mention that bringing the edges together was incomplete given the size)  We discussed a few things 1. Eliquis 2.5 is not providing her stroke reduction given her appropriate dose is 5mg  BID 2. She has not had bleeding issues with eliquis outside of her polyp removals 3. Watchman     I am not entirely convinced she needs this at this juncture given it seems her LGIB were post procedure/polyp removal     To her knowledge she has no other polyps, and that pathology of her polyps were all benign  She asked 1. Is there any medicines/procedures that can be done to stop the AFib     I discussed with her that her Afib is felt to be permanent at this juncture anddid not think she had AAD or procedural options, and that we had no cure for AFib   2. She asks about vitamin supplement options instead of Eliquis, blood thinners and if she needs to stay on blood thinner, an she uses Omegas      I suggested that she not use Omegas       I told her there are no OTC/suplements that offer stroke reduction risk for Afib   At the end of our discussion, recommend now that she is nearly 2 weeks after her GIB and a month post procedure, she resume Eliquis 5mg  BID. She will have a virtual visit with Dr. Caryl Comes Monday to discuss further She is comfortable with the plan    2. NICM     Felt tachy-mediated     Normalization of her EF by last echo  3. Hypotension     She is on lisinopril it seems for renal  protection/diabetes     She is on metoprolol for rate copntrol     On midodrine for low BPs     She is happy with this regime, I will defer to Dr. Caryl Comes       Disposition: F/u with as above  Current medicines are reviewed at length with the patient today.  The patient did not have any concerns regarding medicines.  Venetia Night, PA-C 05/26/2020 7:29 PM     [place] [address] [phone] (office)  [phone] (fax)

## 2020-05-27 ENCOUNTER — Ambulatory Visit: Payer: Medicare Other | Admitting: Physician Assistant

## 2020-05-27 ENCOUNTER — Other Ambulatory Visit: Payer: Self-pay

## 2020-05-27 VITALS — BP 102/66 | HR 68 | Ht 67.0 in | Wt 192.0 lb

## 2020-05-27 DIAGNOSIS — I952 Hypotension due to drugs: Secondary | ICD-10-CM

## 2020-05-27 DIAGNOSIS — I428 Other cardiomyopathies: Secondary | ICD-10-CM | POA: Diagnosis not present

## 2020-05-27 DIAGNOSIS — I4821 Permanent atrial fibrillation: Secondary | ICD-10-CM

## 2020-05-27 NOTE — Patient Instructions (Signed)
Medication Instructions:    START TAKING ELIQUIS 5 MG TWICE A DAY   *If you need a refill on your cardiac medications before your next appointment, please call your pharmacy*   Lab Work: NONE ORDERED  TODAY   If you have labs (blood work) drawn today and your tests are completely normal, you will receive your results only by: [name] MyChart Message (if you have MyChart) OR . A paper copy in the mail If you have any lab test that is abnormal or we need to change your treatment, we will call you to review the results.   Testing/Procedures: NONE ORDERED  TODAY   Follow-Up: At [facility], you and your health needs are our priority.  As part of our continuing mission to provide you with exceptional heart care, we have created designated Provider Care Teams.  These Care Teams include your primary Cardiologist (physician) and Advanced Practice Providers (APPs -  Physician Assistants and Nurse Practitioners) who all work together to provide you with the care you need, when you need it.  We recommend signing up for the patient portal called "MyChart".  Sign up information is provided on this After Visit Summary.  MyChart is used to connect with patients for Virtual Visits (Telemedicine).  Patients are able to view lab/test results, encounter notes, upcoming appointments, etc.  Non-urgent messages can be sent to your provider as well.   To learn more about what you can do with MyChart, go to [email or web address].    Your next appointment:  AS SCHEDULED WITH DR Caryl Comes

## 2020-05-28 ENCOUNTER — Ambulatory Visit (INDEPENDENT_AMBULATORY_CARE_PROVIDER_SITE_OTHER): Payer: Medicare Other

## 2020-05-28 DIAGNOSIS — I8312 Varicose veins of left lower extremity with inflammation: Secondary | ICD-10-CM

## 2020-05-28 DIAGNOSIS — I8311 Varicose veins of right lower extremity with inflammation: Secondary | ICD-10-CM

## 2020-05-31 ENCOUNTER — Encounter: Payer: Self-pay | Admitting: Internal Medicine

## 2020-05-31 ENCOUNTER — Telehealth (INDEPENDENT_AMBULATORY_CARE_PROVIDER_SITE_OTHER): Payer: Medicare Other | Admitting: Internal Medicine

## 2020-05-31 ENCOUNTER — Other Ambulatory Visit: Payer: Self-pay

## 2020-05-31 VITALS — BP 117/71 | HR 87 | Ht 67.0 in | Wt 194.0 lb

## 2020-05-31 DIAGNOSIS — I4819 Other persistent atrial fibrillation: Secondary | ICD-10-CM | POA: Diagnosis not present

## 2020-05-31 DIAGNOSIS — I428 Other cardiomyopathies: Secondary | ICD-10-CM

## 2020-05-31 NOTE — Progress Notes (Signed)
Electrophysiology TeleHealth Note   Due to national recommendations of social distancing due to COVID 19, an audio/video telehealth visit is felt to be most appropriate for this patient at this time.  See MyChart message from today for the patient's consent to telehealth for [facility].   Date:  05/31/2020   ID:  Brandy [name], DOB 10/01/1955, MRN [id number]  Location: patient's home  Provider location: [address], [place]  Evaluation Performed: Follow-up visit  PCP:  Glean Hess, MD  Cardiologist:    Electrophysiologist:  SK   Chief Complaint:   atrail fib  History of Present Illness:    Brandy [name] is a 65 y.o. female who presents via audio/video conferencing for a telehealth visit today.  Since last being seen in our clinic for atrial fib, permanent v longstanding persistent  Afib and resolved cardiomyopathy the patient reports undergoing polypectomy 12/21 with loss of Hgb with instructions for low dose Apixoban    The patient denies chest pain , shortness of breath, nocturnal dyspnea , orthopnea  or peripheral edema  There have been some palpitations w exertion No lightheadedness  or syncope .   HR >> 135 with light activity    Date Cr K Mg Dig Hgb  3/18  0.76 3.9    14.5 (2/18)  4/18  0.76 4.2 2.0    5/18 0.83 4.9   15.1  2/19 0.84 4.1 2.1    10/19 0.96 4.5 2.3  13.6    9/21 0.78 3.7  0.7 9.4 >>10.7  1/22     12.0   DATE TEST EF   11/17 Echo  15-20 %   1/18 Echo  35 %   5/18 Echo  50-55% LA size-normal   8/20 Echo [id number]     Thromboembolic risk factors ( age -48, CHF +/-1 , Gender-1) for a CHADSVASc Score of >=2   The patient denies symptoms of fevers, chills, cough, or new SOB worrisome for COVID 19.    Past Medical History:  Diagnosis Date  . Chicken pox   . Chronic systolic CHF (congestive heart failure) ([place])    a. 03/2016 Echo: Ef 15-20%, diff HK, ant AK;  b. 05/2016 Echo: Ef 30-35%, diff HK, mildly dil LA/RA.  .  Diabetes mellitus without complication ([place])   . GERD (gastroesophageal reflux disease)   . Heart murmur   . Hip discomfort    been going on for 40 years   . History of kidney stones   . Moderate mitral regurgitation    a. 03/2016 Echo: mod MR in setting of LV dysfxn.  . Motion sickness    car - back seat  . NICM (nonischemic cardiomyopathy) ([place])    a. 03/2016 Echo: EF 15-20%, diff HK, ant AK, mod MR, mod dil LA, mildly dil RA;  b. 04/2016 Cath: nl cors;  c. 05/2016 Echo: EF 30-35%, diff HK.  . Obesity   . Obstructive sleep apnea    compliant with CPAP  . Persistent atrial fibrillation ([place])    a. Dx 03/2016;  b. CHA2DS2VASc = 2-->Eliquis 5mg  BID;  b. 05/2016 Failed DCCV x 4.  . Sleep apnea   . Visit for monitoring Tikosyn therapy 07/24/2016    Past Surgical History:  Procedure Laterality Date  . BIOPSY N/A 01/12/2020   Procedure: BIOPSY;  Surgeon: Virgel Manifold, MD;  Location: [place];  Service: Endoscopy;  Laterality: N/A;  . CARDIAC CATHETERIZATION N/A 04/17/2016  Procedure: Left Heart Cath and Coronary Angiography;  Surgeon: Wellington Hampshire, MD;  Location: [place] [facility];  Service: Cardiovascular;  Laterality: N/A;  . CHOLECYSTECTOMY    . COLONOSCOPY N/A 01/20/2020   Procedure: COLONOSCOPY;  Surgeon: Lesly Rubenstein, MD;  [facility]COPY;  Service: Endoscopy;  Laterality: N/A;  . COLONOSCOPY WITH PROPOFOL N/A 01/12/2020   Procedure: COLONOSCOPY WITH PROPOFOL;  Surgeon: Virgel Manifold, MD;  Location: [place];  Service: Endoscopy;  Laterality: N/A;  Diabetic - oral meds sleep apnea  . ELECTROPHYSIOLOGIC STUDY N/A 05/26/2016   Procedure: Cardioversion;  Surgeon: Wellington Hampshire, MD;  Location: [facility];  Service: Cardiovascular;  Laterality: N/A;  . ESOPHAGOGASTRODUODENOSCOPY (EGD) WITH PROPOFOL N/A 01/12/2020   Procedure: ESOPHAGOGASTRODUODENOSCOPY (EGD) WITH PROPOFOL;  Surgeon: Virgel Manifold, MD;  Location: [place];  Service: Endoscopy;  Laterality: N/A;  . POLYPECTOMY N/A 01/12/2020   Procedure: POLYPECTOMY;  Surgeon: Virgel Manifold, MD;  Location: [place];  Service: Endoscopy;  Laterality: N/A;    Current Outpatient Medications  Medication Sig Dispense Refill  . apixaban (ELIQUIS) 5 MG TABS tablet Take 1 tablet (5 mg total) by mouth 2 (two) times daily. 180 tablet 1  . Cholecalciferol (VITAMIN D3) 1000 units CAPS Take 1 capsule by mouth 2 (two) times daily.     . furosemide (LASIX) 20 MG tablet Take 1 tablet (20 mg total) by mouth daily as needed for fluid or edema. 30 tablet 0  . Lancets (ONETOUCH DELICA PLUS [id number]) MISC UP TO 4 TIMES DAILY AS DIRECTED 100 each 0  . Lavender Oil OIL Apply 1 application topically daily as needed.     Koren Bound Oil OIL Take 1 application by mouth daily as needed.     Cindy Hazy Oil OIL Apply 1 application topically daily as needed.     [name] lisinopril (ZESTRIL) 2.5 MG tablet Take 1 tablet (2.5 mg total) by mouth daily. 90 tablet 3  . MAGNESIUM GLUCONATE PO Take 50 mg by mouth 4 (four) times daily.    . metFORMIN (GLUCOPHAGE-XR) 500 MG 24 hr tablet Take 1/2 tablet by mouth twice a day 90 tablet 3  . metoprolol tartrate (LOPRESSOR) 50 MG tablet Take 1 tablet (50 mg total) by mouth 2 (two) times daily. 180 tablet 3  . midodrine (PROAMATINE) 2.5 MG tablet Take 1 tablet (2.5 mg) by mouth three times a day at 6 am, 10 am, & 2 pm 270 tablet 2  . Multiple Vitamins-Minerals (MULTIVITAMIN WITH MINERALS) tablet Take 1 tablet by mouth daily.     . OIL OF OREGANO PO Take 1 application by mouth daily. To left big toe     . ONETOUCH VERIO test strip USE UP TO 4 TIMES DAILY AS DIRECTED 100 each 0  . peppermint oil liquid Apply 1 application topically daily as needed.     . vitamin C (ASCORBIC ACID) 500 MG tablet Take 500 mg by mouth daily.     [name] zinc gluconate 50 MG tablet Take 50 mg by mouth daily.     No current facility-administered medications for this  visit.    Allergies:   Patient has no known allergies.   Social History:  The patient  reports that she has never smoked. She has never used smokeless tobacco. She reports previous alcohol use. She reports that she does not use drugs.   Family History:  The patient's   family history includes COPD in her father and mother;  Diabetes in her paternal aunt; Heart disease in her father; Hypertension in her father.   ROS:  Please see the history of present illness.   All other systems are personally reviewed and negative.    Exam:    Vital Signs:  BP 117/71   Pulse 87   Ht 5\' 7"  (1.702 m)   Wt 194 lb (88 kg)   BMI 30.38 kg/m     Well appearing, alert and conversant, regular work of breathing,  good skin color Eyes- anicteric, neuro- grossly intact, skin- no apparent rash or lesions or cyanosis, mouth- oral mucosa is pink   Labs/Other Tests and Data Reviewed:    Recent Labs: 10/20/2019: TSH 1.880 01/18/2020: ALT 28 01/24/2020: BUN 11; Creatinine, Ser 0.78; Magnesium 2.4; Potassium 3.7; Sodium 139 05/24/2020: Hemoglobin 12.0; Platelets 336   Wt Readings from Last 3 Encounters:  05/31/20 194 lb (88 kg)  05/27/20 192 lb (87.1 kg)  05/25/20 192 lb 6.4 oz (87.3 kg)     Other studies personally reviewed: Additional studies/ records that were reviewed today include:    ASSESSMENT & PLAN:    Atrial fibrillation-permanent  Cardiomyopathy-- nonischemic-rate related resolved  Anemia  Obesity resolving  Watchman lengthy discussion regarding the role v anticoagulation, particularly NOACs  Will plan to revisit when she returns to the office  AFib permanent v long standing persistent    ? Candidate for ablation  Will review LA size and LVEF   With rapid HR options inclu adjunctive CCB and the possibility of ablation in the setting of longstanding persistent afib  Hgb stable   The issue of anticoagulation long term is not clear to me,  Her CHADSVASC score currently measures is  1(2)-- NO HX  Of HTN as previously reported, and I dont know how to count TE risk wtith ecovered LVEF  Midodrine started in the context ofGI bleed assoc hypotension >>  will stop and monitor BP     COVID 19 screen The patient denies symptoms of COVID 19 at this time.  The importance of social distancing was discussed today.  Follow-up:  As Scheduled On recall for 5/22    Current medicines are reviewed at length with the patient today.   The patient has concerns regarding her medicines.  The following changes were made today: metoprolol 25 prn added to the metoprolol 50 bid Hold midodrine   Labs/ tests ordered today include: 2 d echo No orders of the defined types were placed in this encounter.   Future tests ( post COVID )     Patient Risk:  after full review of this patients clinical status, I feel that they are at moderate  risk at this time.  Today, I have spent  25   minutes with the patient with telehealth technology discussing the above.  Signed, Virl Axe, MD  05/31/2020 11:48 AM     [place] [address] [phone] (office) [phone] (fax)

## 2020-05-31 NOTE — Patient Instructions (Signed)
Medication Instructions:  **Hold Midodrine  **You may take Metoprolol 25mg  as needed for rapid heart rate.  *If you need a refill on your cardiac medications before your next appointment, please call your pharmacy*   Lab Work: None ordered.  If you have labs (blood work) drawn today and your tests are completely normal, you will receive your results only by: [name] MyChart Message (if you have MyChart) OR . A paper copy in the mail If you have any lab test that is abnormal or we need to change your treatment, we will call you to review the results.   Testing/Procedures:  You will receive a call to schedule echo Your physician has requested that you have an echocardiogram. Echocardiography is a painless test that uses sound waves to create images of your heart. It provides your doctor with information about the size and shape of your heart and how well your heart's chambers and valves are working. This procedure takes approximately one hour. There are no restrictions for this procedure.    Follow-Up: At [facility], you and your health needs are our priority.  As part of our continuing mission to provide you with exceptional heart care, we have created designated Provider Care Teams.  These Care Teams include your primary Cardiologist (physician) and Advanced Practice Providers (APPs -  Physician Assistants and Nurse Practitioners) who all work together to provide you with the care you need, when you need it.  We recommend signing up for the patient portal called "MyChart".  Sign up information is provided on this After Visit Summary.  MyChart is used to connect with patients for Virtual Visits (Telemedicine).  Patients are able to view lab/test results, encounter notes, upcoming appointments, etc.  Non-urgent messages can be sent to your provider as well.   To learn more about what you can do with MyChart, go to [email or web address].    Your next appointment:  Recall scheduled for May.   We will send you a reminder letter when it is time to schedule.

## 2020-06-04 ENCOUNTER — Other Ambulatory Visit: Payer: Self-pay

## 2020-06-04 ENCOUNTER — Encounter (INDEPENDENT_AMBULATORY_CARE_PROVIDER_SITE_OTHER): Payer: Self-pay | Admitting: Vascular Surgery

## 2020-06-04 ENCOUNTER — Ambulatory Visit (INDEPENDENT_AMBULATORY_CARE_PROVIDER_SITE_OTHER): Payer: Medicare Other | Admitting: Vascular Surgery

## 2020-06-04 VITALS — BP 103/66 | HR 89 | Ht 67.0 in | Wt 195.0 lb

## 2020-06-04 DIAGNOSIS — I8311 Varicose veins of right lower extremity with inflammation: Secondary | ICD-10-CM | POA: Diagnosis not present

## 2020-06-04 DIAGNOSIS — I8312 Varicose veins of left lower extremity with inflammation: Secondary | ICD-10-CM | POA: Diagnosis not present

## 2020-06-04 NOTE — Progress Notes (Signed)
Brandy [name] is a 65 y.o. female who presents with symptomatic venous reflux  Past Medical History:  Diagnosis Date  . Chicken pox   . Chronic systolic CHF (congestive heart failure) ([place])    a. 03/2016 Echo: Ef 15-20%, diff HK, ant AK;  b. 05/2016 Echo: Ef 30-35%, diff HK, mildly dil LA/RA.  . Diabetes mellitus without complication ([place])   . GERD (gastroesophageal reflux disease)   . Heart murmur   . Hip discomfort    been going on for 40 years   . History of kidney stones   . Moderate mitral regurgitation    a. 03/2016 Echo: mod MR in setting of LV dysfxn.  . Motion sickness    car - back seat  . NICM (nonischemic cardiomyopathy) ([place])    a. 03/2016 Echo: EF 15-20%, diff HK, ant AK, mod MR, mod dil LA, mildly dil RA;  b. 04/2016 Cath: nl cors;  c. 05/2016 Echo: EF 30-35%, diff HK.  . Obesity   . Obstructive sleep apnea    compliant with CPAP  . Persistent atrial fibrillation ([place])    a. Dx 03/2016;  b. CHA2DS2VASc = 2-->Eliquis 5mg  BID;  b. 05/2016 Failed DCCV x 4.  . Sleep apnea   . Visit for monitoring Tikosyn therapy 07/24/2016    Past Surgical History:  Procedure Laterality Date  . BIOPSY N/A 01/12/2020   Procedure: BIOPSY;  Surgeon: Virgel Manifold, MD;  Location: [place];  Service: Endoscopy;  Laterality: N/A;  . CARDIAC CATHETERIZATION N/A 04/17/2016   Procedure: Left Heart Cath and Coronary Angiography;  Surgeon: Wellington Hampshire, MD;  Location: [place] [facility];  Service: Cardiovascular;  Laterality: N/A;  . CHOLECYSTECTOMY    . COLONOSCOPY N/A 01/20/2020   Procedure: COLONOSCOPY;  Surgeon: Lesly Rubenstein, MD;  Location: [facility] END[facility]COPY;  Service: Endoscopy;  Laterality: N/A;  . COLONOSCOPY WITH PROPOFOL N/A 01/12/2020   Procedure: COLONOSCOPY WITH PROPOFOL;  Surgeon: Virgel Manifold, MD;  Location: [place];  Service: Endoscopy;  Laterality: N/A;  Diabetic - oral meds sleep apnea  . ELECTROPHYSIOLOGIC STUDY N/A 05/26/2016    Procedure: Cardioversion;  Surgeon: Wellington Hampshire, MD;  Location: [facility];  Service: Cardiovascular;  Laterality: N/A;  . ESOPHAGOGASTRODUODENOSCOPY (EGD) WITH PROPOFOL N/A 01/12/2020   Procedure: ESOPHAGOGASTRODUODENOSCOPY (EGD) WITH PROPOFOL;  Surgeon: Virgel Manifold, MD;  Location: [place];  Service: Endoscopy;  Laterality: N/A;  . POLYPECTOMY N/A 01/12/2020   Procedure: POLYPECTOMY;  Surgeon: Virgel Manifold, MD;  Location: [place];  Service: Endoscopy;  Laterality: N/A;     Current Outpatient Medications:  .  apixaban (ELIQUIS) 5 MG TABS tablet, Take 1 tablet (5 mg total) by mouth 2 (two) times daily., Disp: 180 tablet, Rfl: 1 .  Cholecalciferol (VITAMIN D3) 1000 units CAPS, Take 1 capsule by mouth 2 (two) times daily. , Disp: , Rfl:  .  furosemide (LASIX) 20 MG tablet, Take 1 tablet (20 mg total) by mouth daily as needed for fluid or edema., Disp: 30 tablet, Rfl: 0 .  Lancets (ONETOUCH DELICA PLUS [id number]) MISC, UP TO 4 TIMES DAILY AS DIRECTED, Disp: 100 each, Rfl: 0 .  Lavender Oil OIL, Apply 1 application topically daily as needed. , Disp: , Rfl:  .  Lemon Oil OIL, Take 1 application by mouth daily as needed. , Disp: , Rfl:  .  Lemongrass Oil OIL, Apply 1 application topically daily as needed. , Disp: , Rfl:  .  lisinopril (  ZESTRIL) 2.5 MG tablet, Take 1 tablet (2.5 mg total) by mouth daily., Disp: 90 tablet, Rfl: 3 .  MAGNESIUM GLUCONATE PO, Take 50 mg by mouth 4 (four) times daily., Disp: , Rfl:  .  metFORMIN (GLUCOPHAGE-XR) 500 MG 24 hr tablet, Take 1/2 tablet by mouth twice a day, Disp: 90 tablet, Rfl: 3 .  metoprolol tartrate (LOPRESSOR) 50 MG tablet, Take 1 tablet (50 mg total) by mouth 2 (two) times daily., Disp: 180 tablet, Rfl: 3 .  Multiple Vitamins-Minerals (MULTIVITAMIN WITH MINERALS) tablet, Take 1 tablet by mouth daily. , Disp: , Rfl:  .  OIL OF OREGANO PO, Take 1 application by mouth daily. To left big toe , Disp: , Rfl:  .  ONETOUCH  VERIO test strip, USE UP TO 4 TIMES DAILY AS DIRECTED, Disp: 100 each, Rfl: 0 .  peppermint oil liquid, Apply 1 application topically daily as needed. , Disp: , Rfl:  .  vitamin C (ASCORBIC ACID) 500 MG tablet, Take 500 mg by mouth daily. , Disp: , Rfl:  .  zinc gluconate 50 MG tablet, Take 50 mg by mouth daily., Disp: , Rfl:  .  midodrine (PROAMATINE) 2.5 MG tablet, Take 1 tablet (2.5 mg) by mouth three times a day at 6 am, 10 am, & 2 pm (Patient not taking: Reported on 06/04/2020), Disp: 270 tablet, Rfl: 2  No Known Allergies   Varicose veins of both lower extremities     PLAN: The patient's right lower extremity was sterilely prepped and draped. The ultrasound machine was used to visualize the saphenous vein throughout its course. A segment in the upper calf was selected for access. The saphenous vein was accessed without difficulty using ultrasound guidance with a micropuncture needle. A 0.018 wire was then placed beyond the saphenofemoral junction and the needle was removed. The 65 cm sheath was then placed over the wire and the wire and dilator were removed. The laser fiber was then placed through the sheath and its tip was placed approximately 5 centimeters below the saphenofemoral junction. Tumescent anesthesia was then created with a dilute lidocaine solution. Laser energy was then delivered with constant withdrawal of the sheath and laser fiber. Approximately 1444 joules of energy were delivered over a length of 35 centimeters using a 1470 Hz VenaCure machine at 7 W. Sterile dressings were placed. The patient tolerated the procedure well without obvious complications.   Follow-up in 1 week with post-laser duplex.

## 2020-06-07 ENCOUNTER — Other Ambulatory Visit: Payer: Self-pay

## 2020-06-07 ENCOUNTER — Ambulatory Visit (INDEPENDENT_AMBULATORY_CARE_PROVIDER_SITE_OTHER): Payer: Medicare Other

## 2020-06-07 DIAGNOSIS — I8312 Varicose veins of left lower extremity with inflammation: Secondary | ICD-10-CM

## 2020-06-07 DIAGNOSIS — I8311 Varicose veins of right lower extremity with inflammation: Secondary | ICD-10-CM

## 2020-06-20 ENCOUNTER — Other Ambulatory Visit: Payer: Self-pay | Admitting: Internal Medicine

## 2020-06-21 NOTE — Telephone Encounter (Signed)
Eliquis 5mg  refill request received. Patient is 65 years old, weight-88.5kg, Crea-0.78 on 01/24/2020, Diagnosis-Afib, and last seen by Dr. Caryl Comes on 05/31/2020 via Telemedicine Visit. Dose is appropriate based on dosing criteria. Will send in refill to requested pharmacy.

## 2020-06-25 ENCOUNTER — Ambulatory Visit: Payer: Medicare Other | Admitting: Internal Medicine

## 2020-07-01 DIAGNOSIS — L84 Corns and callosities: Secondary | ICD-10-CM | POA: Diagnosis not present

## 2020-07-01 DIAGNOSIS — L851 Acquired keratosis [keratoderma] palmaris et plantaris: Secondary | ICD-10-CM | POA: Diagnosis not present

## 2020-07-01 DIAGNOSIS — Q828 Other specified congenital malformations of skin: Secondary | ICD-10-CM | POA: Diagnosis not present

## 2020-07-01 DIAGNOSIS — D2372 Other benign neoplasm of skin of left lower limb, including hip: Secondary | ICD-10-CM | POA: Diagnosis not present

## 2020-07-02 ENCOUNTER — Telehealth: Payer: Self-pay | Admitting: Internal Medicine

## 2020-07-02 NOTE — Telephone Encounter (Signed)
R  good morning   you are seeing this lady next week and the issues relate to High HR and low BP   using midodrine started during hospitalization for GI bleed and tehn I stopped to see what happened and BP went down  so two thoughts 1) resume proamatine 2) an event recorder to assess HR excursion

## 2020-07-02 NOTE — Telephone Encounter (Signed)
Called patient about her message. Patient stated that she has been feeling like she is going to faint. She has been checking her BP, which has been running low and HR has been high. Patient stated she has been taking her metoprolol 50 mg BID, but was told to stop Midodrine 2.5 mg TID in January. Patient stated she felt fine when taking the midodrine and wondering if she could go back on it, and plus this would bring her BP up. Consulted DOD, Dr. Johney Frame, you recommend patient go back on midodrine and monitor her BPs and see Dr. Caryl Comes in the next week. Called patient back with recommendations. Patient agreed to plan. Will send message to Dr. Caryl Comes, his nurse and his scheduler to get patient in next week.

## 2020-07-02 NOTE — Telephone Encounter (Signed)
STAT if patient feels like he/she is going to faint   1) Are you dizzy now? No   2) Do you feel faint or have you passed out? No   3) Do you have any other symptoms? No   4) Have you checked your HR and BP (record if available)?   06/30/20: BP- 78/64 HR-96      BP-103/64  07/01/20: HR- 145  07/02/20: BP- 119/70 (patient states HR was normal)      BP- 98/62 (patient states HR was normal)      BP- 96/61 (patient states HR was normal)

## 2020-07-05 NOTE — Telephone Encounter (Signed)
Pt requested to schedule appointment for 07/26/2020 with APP due to just starting Midodrine.

## 2020-07-06 ENCOUNTER — Encounter (INDEPENDENT_AMBULATORY_CARE_PROVIDER_SITE_OTHER): Payer: Self-pay | Admitting: Vascular Surgery

## 2020-07-06 ENCOUNTER — Ambulatory Visit (INDEPENDENT_AMBULATORY_CARE_PROVIDER_SITE_OTHER): Payer: Medicare Other | Admitting: Vascular Surgery

## 2020-07-06 ENCOUNTER — Other Ambulatory Visit: Payer: Self-pay

## 2020-07-06 VITALS — BP 116/74 | HR 118 | Ht 67.0 in | Wt 194.0 lb

## 2020-07-06 DIAGNOSIS — I4819 Other persistent atrial fibrillation: Secondary | ICD-10-CM | POA: Diagnosis not present

## 2020-07-06 DIAGNOSIS — E669 Obesity, unspecified: Secondary | ICD-10-CM

## 2020-07-06 DIAGNOSIS — I83813 Varicose veins of bilateral lower extremities with pain: Secondary | ICD-10-CM | POA: Insufficient documentation

## 2020-07-06 DIAGNOSIS — E1169 Type 2 diabetes mellitus with other specified complication: Secondary | ICD-10-CM | POA: Diagnosis not present

## 2020-07-06 NOTE — Progress Notes (Signed)
MRN : [id number]  Brandy [name] is a 65 y.o. ([birth year]) female who presents with chief complaint of  Chief Complaint  Patient presents with  . Follow-up    4 wk post laser   .  History of Present Illness: Patient returns today in follow up of her venous disease.  She has undergone bilateral great saphenous vein laser ablations without complication.  She is doing well.  She has no specific complaints today other than residual painful varicosities in the lower leg bilaterally, worse on the left than the right.  Current Outpatient Medications  Medication Sig Dispense Refill  . Cholecalciferol (VITAMIN D3) 1000 units CAPS Take 1 capsule by mouth 2 (two) times daily.     [name] ELIQUIS 5 MG TABS tablet Take 1 tablet by mouth twice daily 180 tablet 1  . Flavoring Agent (LEMON FLAVOR) OIL Take by mouth.    . furosemide (LASIX) 20 MG tablet Take 1 tablet (20 mg total) by mouth daily as needed for fluid or edema. 30 tablet 0  . Lancets (ONETOUCH DELICA PLUS [id number]) MISC UP TO 4 TIMES DAILY AS DIRECTED 100 each 0  . Lavender Oil OIL Apply 1 application topically daily as needed.     Koren Bound Oil OIL Take 1 application by mouth daily as needed.     Cindy Hazy Oil OIL Apply 1 application topically daily as needed.     [name] lisinopril (ZESTRIL) 2.5 MG tablet Take 1 tablet (2.5 mg total) by mouth daily. 90 tablet 3  . Magnesium Bisglycinate (MAG GLYCINATE) 100 MG TABS Take by mouth.    [name] MAGNESIUM GLUCONATE PO Take 50 mg by mouth 4 (four) times daily.    . metFORMIN (GLUCOPHAGE-XR) 500 MG 24 hr tablet Take 1/2 tablet by mouth twice a day 90 tablet 3  . metoprolol tartrate (LOPRESSOR) 50 MG tablet Take 1 tablet (50 mg total) by mouth 2 (two) times daily. 180 tablet 3  . midodrine (PROAMATINE) 2.5 MG tablet Take 1 tablet (2.5 mg) by mouth three times a day at 6 am, 10 am, & 2 pm 270 tablet 2  . Multiple Vitamins-Minerals (MULTIVITAMIN ADULT EXTRA C PO) Take 1 tablet by mouth daily.    . Multiple  Vitamins-Minerals (MULTIVITAMIN WITH MINERALS) tablet Take 1 tablet by mouth daily.     . Oil of Oregano 1500 MG CAPS Take by mouth.    . OIL OF OREGANO PO Take 1 application by mouth daily. To left big toe     . ONETOUCH VERIO test strip USE UP TO 4 TIMES DAILY AS DIRECTED 100 each 0  . peppermint oil liquid Apply 1 application topically daily as needed.     . Tea Tree Oil OIL Apply topically.    . vitamin C (ASCORBIC ACID) 500 MG tablet Take 500 mg by mouth daily.     [name] zinc gluconate 50 MG tablet Take 50 mg by mouth daily.     No current facility-administered medications for this visit.    Past Medical History:  Diagnosis Date  . Chicken pox   . Chronic systolic CHF (congestive heart failure) ([place])    a. 03/2016 Echo: Ef 15-20%, diff HK, ant AK;  b. 05/2016 Echo: Ef 30-35%, diff HK, mildly dil LA/RA.  . Diabetes mellitus without complication ([place])   . GERD (gastroesophageal reflux disease)   . Heart murmur   . Hip discomfort    been going on for 40 years   . History of  kidney stones   . Moderate mitral regurgitation    a. 03/2016 Echo: mod MR in setting of LV dysfxn.  . Motion sickness    car - back seat  . NICM (nonischemic cardiomyopathy) ([place])    a. 03/2016 Echo: EF 15-20%, diff HK, ant AK, mod MR, mod dil LA, mildly dil RA;  b. 04/2016 Cath: nl cors;  c. 05/2016 Echo: EF 30-35%, diff HK.  . Obesity   . Obstructive sleep apnea    compliant with CPAP  . Persistent atrial fibrillation ([place])    a. Dx 03/2016;  b. CHA2DS2VASc = 2-->Eliquis 5mg  BID;  b. 05/2016 Failed DCCV x 4.  . Sleep apnea   . Visit for monitoring Tikosyn therapy 07/24/2016    Past Surgical History:  Procedure Laterality Date  . BIOPSY N/A 01/12/2020   Procedure: BIOPSY;  Surgeon: Virgel Manifold, MD;  Location: [place];  Service: Endoscopy;  Laterality: N/A;  . CARDIAC CATHETERIZATION N/A 04/17/2016   Procedure: Left Heart Cath and Coronary Angiography;  Surgeon: Wellington Hampshire, MD;   Location: [place] [facility];  Service: Cardiovascular;  Laterality: N/A;  . CHOLECYSTECTOMY    . COLONOSCOPY N/A 01/20/2020   Procedure: COLONOSCOPY;  Surgeon: Lesly Rubenstein, MD;  Location: [facility] ENDOSCOPY;  Service: Endoscopy;  Laterality: N/A;  . COLONOSCOPY WITH PROPOFOL N/A 01/12/2020   Procedure: COLONOSCOPY WITH PROPOFOL;  Surgeon: Virgel Manifold, MD;  Location: [place];  Service: Endoscopy;  Laterality: N/A;  Diabetic - oral meds sleep apnea  . ELECTROPHYSIOLOGIC STUDY N/A 05/26/2016   Procedure: Cardioversion;  Surgeon: Wellington Hampshire, MD;  Location: [facility];  Service: Cardiovascular;  Laterality: N/A;  . ESOPHAGOGASTRODUODENOSCOPY (EGD) WITH PROPOFOL N/A 01/12/2020   Procedure: ESOPHAGOGASTRODUODENOSCOPY (EGD) WITH PROPOFOL;  Surgeon: Virgel Manifold, MD;  Location: [place];  Service: Endoscopy;  Laterality: N/A;  . POLYPECTOMY N/A 01/12/2020   Procedure: POLYPECTOMY;  Surgeon: Virgel Manifold, MD;  Location: [place];  Service: Endoscopy;  Laterality: N/A;     Social History   Tobacco Use  . Smoking status: Never Smoker  . Smokeless tobacco: Never Used  Vaping Use  . Vaping Use: Never used  Substance Use Topics  . Alcohol use: Not Currently  . Drug use: No      Family History  Problem Relation Age of Onset  . COPD Mother   . COPD Father   . Heart disease Father   . Hypertension Father   . Diabetes Paternal Aunt      No Known Allergies   REVIEW OF SYSTEMS (Negative unless checked)  Constitutional: [] Weight loss  [] Fever  [] Chills Cardiac: [] Chest pain   [] Chest pressure   [x] Palpitations   [] Shortness of breath when laying flat   [] Shortness of breath at rest   [] Shortness of breath with exertion. Vascular:  [] Pain in legs with walking   [] Pain in legs at rest   [] Pain in legs when laying flat   [] Claudication   [] Pain in feet when walking  [] Pain in feet at rest  [] Pain in feet when laying flat    [] History of DVT   [] Phlebitis   [x] Swelling in legs   [x] Varicose veins   [] Non-healing ulcers Pulmonary:   [] Uses home oxygen   [] Productive cough   [] Hemoptysis   [] Wheeze  [] COPD   [] Asthma Neurologic:  [] Dizziness  [] Blackouts   [] Seizures   [] History of stroke   [] History of TIA  [] Aphasia   [] Temporary blindness   []   Dysphagia   [] Weakness or numbness in arms   [] Weakness or numbness in legs Musculoskeletal:  [x] Arthritis   [] Joint swelling   [] Joint pain   [] Low back pain Hematologic:  [] Easy bruising  [] Easy bleeding   [] Hypercoagulable state   [] Anemic   Gastrointestinal:  [] Blood in stool   [] Vomiting blood  [] Gastroesophageal reflux/heartburn   [] Abdominal pain Genitourinary:  [] Chronic kidney disease   [] Difficult urination  [] Frequent urination  [] Burning with urination   [] Hematuria Skin:  [] Rashes   [] Ulcers   [] Wounds Psychological:  [] History of anxiety   []  History of major depression.  Physical Examination  BP 116/74   Pulse (!) 118   Ht 5\' 7"  (1.702 m)   Wt 194 lb (88 kg)   BMI 30.38 kg/m  Gen:  WD/WN, NAD Head: [place]/AT, No temporalis wasting. Ear/Nose/Throat: Hearing grossly intact, nares w/o erythema or drainage Eyes: Conjunctiva clear. Sclera non-icteric Neck: Supple.  Trachea midline Pulmonary:  Good air movement, no use of accessory muscles.  Cardiac: irregular Vascular:  Vessel Right Left  Radial Palpable Palpable                          PT Palpable Palpable  DP Palpable Palpable   Gastrointestinal: soft, non-tender/non-distended. No guarding/reflex.  Musculoskeletal: M/S 5/5 throughout.  No deformity or atrophy.  Scattered varicosities on the right, diffuse and large varicosities on the left.  Trace lower extremity edema. Neurologic: Sensation grossly intact in extremities.  Symmetrical.  Speech is fluent.  Psychiatric: Judgment intact, Mood & affect appropriate for pt's clinical situation. Dermatologic: No rashes or ulcers noted.  No cellulitis or  open wounds.       Labs Recent Results (from the past 2160 hour(s))  Microalbumin / creatinine urine ratio     Status: None   Collection Time: 05/24/20 12:00 AM  Result Value Ref Range   Creatinine, Urine 13.6 Not Estab. mg/dL   Microalbumin, Urine <3.0 Not Estab. ug/mL   Microalb/Creat Ratio <22 0 - 29 mg/g creat    Comment:                        Normal:                0 -  29                        Moderately increased: 30 - 300                        Severely increased:       >300   Hemoglobin A1c     Status: Abnormal   Collection Time: 05/24/20  3:43 PM  Result Value Ref Range   Hgb A1c MFr Bld 6.1 (H) 4.8 - 5.6 %    Comment:          Prediabetes: 5.7 - 6.4          Diabetes: >6.4          Glycemic control for adults with diabetes: <7.0    Est. average glucose Bld gHb Est-mCnc 128 mg/dL  CBC with Differential/Platelet     Status: Abnormal   Collection Time: 05/24/20  3:43 PM  Result Value Ref Range   WBC 8.0 3.4 - 10.8 x10E3/uL   RBC 4.24 3.77 - 5.28 x10E6/uL   Hemoglobin 12.0 11.1 - 15.9 g/dL   Hematocrit 36.6  34.0 - 46.6 %   MCV 86 79 - 97 fL   MCH 28.3 26.6 - 33.0 pg   MCHC 32.8 31.5 - 35.7 g/dL   RDW 14.8 11.7 - 15.4 %   Platelets 336 150 - 450 x10E3/uL   Neutrophils 40 Not Estab. %   Lymphs 46 Not Estab. %   Monocytes 10 Not Estab. %   Eos 3 Not Estab. %   Basos 1 Not Estab. %   Neutrophils Absolute 3.2 1.4 - 7.0 x10E3/uL   Lymphocytes Absolute 3.8 (H) 0.7 - 3.1 x10E3/uL   Monocytes Absolute 0.8 0.1 - 0.9 x10E3/uL   EOS (ABSOLUTE) 0.2 0.0 - 0.4 x10E3/uL   Basophils Absolute 0.1 0.0 - 0.2 x10E3/uL   Immature Granulocytes 0 Not Estab. %   Immature Grans (Abs) 0.0 0.0 - 0.1 x10E3/uL    Radiology VAS [place] LASER ABLATION OF SUPERFICIAL VEI  Result Date: 06/08/2020  Lower Venous Study Other Indications: Rt gsv ablation. Performing Technologist: Concha Norway RVT  Examination Guidelines: A complete evaluation includes B-mode imaging, spectral Doppler, color  Doppler, and power Doppler as needed of all accessible portions of each vessel. Bilateral testing is considered an integral part of a complete examination. Limited examinations for reoccurring indications may be performed as noted.    Summary: Right: - There is no evidence of deep vein thrombosis in the lower extremity. However, portions of this examination were limited- see technologist comments above. Right GSV closed post ablation 2.85cm from SFJ.  *See table(s) above for measurements and observations. Electronically signed by Leotis Pain MD on 06/08/2020 at 1:29:34 PM.    Final     Assessment/Plan  Persistent atrial fibrillation ([place]) On anticoagulation  Diabetes mellitus type 2 in obese (HCC) blood glucose control important in reducing the progression of atherosclerotic disease. Also, involved in wound healing. On appropriate medications.   Varicose veins of leg with pain, bilateral Recommend:  The patient has had successful ablation of the previously incompetent saphenous venous system but still has persistent symptoms of pain and swelling that are having a negative impact on daily life and daily activities.  Patient should undergo injection sclerotherapy to treat the residual varicosities.  This will be a combination of foam sclerotherapy and conventional sclerotherapy.  The risks, benefits and alternative therapies were reviewed in detail with the patient.  All questions were answered.  The patient agrees to proceed with sclerotherapy at their convenience.  The patient will continue wearing the graduated compression stockings and using the over-the-counter pain medications to treat her symptoms.         Leotis Pain, MD  07/06/2020 4:02 PM    This note was created with Dragon medical transcription system.  Any errors from dictation are purely unintentional

## 2020-07-06 NOTE — Assessment & Plan Note (Signed)
blood glucose control important in reducing the progression of atherosclerotic disease. Also, involved in wound healing. On appropriate medications.  

## 2020-07-06 NOTE — Patient Instructions (Signed)
Sclerotherapy Sclerotherapy is a procedure that is done to improve the appearance of varicose veins and spider veins and to help relieve aching, swelling, cramping, and pain in the legs. Varicose veins are veins that have become enlarged, bulging, and twisted due to a damaged valve that causes blood to collect (pool) in the veins. Spider veins are small varicose veins. Sclerotherapy usually works best for smaller spider and varicose veins. This procedure involves injecting a chemical into the vein to close it off. You may need more than one treatment to close a vein all the way. Sclerotherapy is usually performed on the legs because that is where varicose and spider veins most often occur. Tell a health care provider about:  Any allergies you have.  All medicines you are taking, including vitamins, herbs, eye drops, creams, and over-the-counter medicines.  Any blood disorders you have.  Any surgeries you have had.  Any medical conditions you have.  Whether you are pregnant or may be pregnant. What are the risks? Generally, this is a safe procedure. However, problems may occur, including:  Infection.  Bleeding.  Allergic reactions to medicines or dyes.  Blood clots.  Nerve damage.  Bruising and scarring.  Darkened skin around the area. What happens before the procedure?  Do not use lotions or creams on your legs unless your health care provider approves.  Follow instructions from your health care provider about eating and drinking restrictions.  Do not use any products that contain nicotine or tobacco, such as cigarettes and e-cigarettes. If you need help quitting, ask your health care provider.  Ask your health care provider about: ? Changing or stopping your regular medicines. This is especially important if you are taking diabetes medicines or blood thinners. ? Taking medicines such as aspirin and ibuprofen. These medicines can thin your blood. Do not take these medicines  before your procedure if your health care provider instructs you not to.  You may have an ultrasound of the affected area to check for blood clots and to check blood flow.  In rare cases, you may have an X-ray procedure to check how blood flows through your veins (angiogram). For an angiogram, a dye is injected to outline your veins on X-rays. What happens during the procedure?  To lower your risk of infection: ? Your health care team will wash or sanitize their hands. ? Your skin will be washed with soap. ? Hair may be removed from the treatment area.  A small, thin needle will be used to inject a chemical (sclerosant) into your varicose vein. The sclerosant will irritate the lining of the vein and cause the vein to close below the injection site. You may feel some stinging, burning, or irritation.  The injection may be repeated for more than one varicose vein.  The injection area will be wrapped with elastic bandages. The procedure may vary among health care providers and hospitals.   What happens after the procedure?  Your injection area will be wrapped with elastic bandages. If there is bleeding, the bandages may be changed.  Do not drive until your health care provider approves. You may need to wait 1-2 days before driving.  You will need to wear compression stockings for about a week, or as long as your health care provider recommends. Summary  Sclerotherapy is a procedure that is done to improve the appearance of varicose veins and spider veins and to help relieve aching, swelling, cramping, and pain in the legs.  A small, thin needle   is used to inject a chemical (sclerosant) into a spider vein or varicose vein to close it off.  Elastic bandages will be wrapped around the injection area after the procedure. This information is not intended to replace advice given to you by your health care provider. Make sure you discuss any questions you have with your health care  provider. Document Revised: 09/15/2019 Document Reviewed: 09/15/2019 Elsevier Patient Education  2021 Elsevier Inc.  

## 2020-07-06 NOTE — Assessment & Plan Note (Signed)
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  Patient should undergo injection sclerotherapy to treat the residual varicosities.  This will be a combination of foam sclerotherapy and conventional sclerotherapy.  The risks, benefits and alternative therapies were reviewed in detail with the patient.  All questions were answered.  The patient agrees to proceed with sclerotherapy at their convenience.  The patient will continue wearing the graduated compression stockings and using the over-the-counter pain medications to treat her symptoms.

## 2020-07-06 NOTE — Assessment & Plan Note (Signed)
On anticoagulation 

## 2020-07-12 DIAGNOSIS — G4733 Obstructive sleep apnea (adult) (pediatric): Secondary | ICD-10-CM | POA: Diagnosis not present

## 2020-07-13 ENCOUNTER — Telehealth: Payer: Self-pay | Admitting: Internal Medicine

## 2020-07-13 MED ORDER — ONETOUCH DELICA PLUS LANCET33G MISC: 1 refills | Status: DC

## 2020-07-13 NOTE — Telephone Encounter (Signed)
Requested Prescriptions  Pending Prescriptions Disp Refills  . Lancets (ONETOUCH DELICA PLUS [id number]) [place] 100 each 1    Sig: UP TO 4 TIMES DAILY AS DIRECTED     Endocrinology: Diabetes - Testing Supplies Passed - 07/13/2020 10:28 AM      Passed - Valid encounter within last 12 months    Recent Outpatient Visits          1 month ago History of lower GI bleeding   [facility] Glean Hess, MD   5 months ago Type II diabetes mellitus with complication [facility])   [facility] Glean Hess, MD   8 months ago Type II diabetes mellitus with complication [facility])   [facility] Glean Hess, MD   8 months ago Persistent atrial fibrillation [facility])   [facility] Glean Hess, MD      Future Appointments            In 1 week Tillery, Satira Mccallum, PA-C [place]   In 4 months Army Melia, Jesse Sans, MD [facility], [place] [facility]

## 2020-07-13 NOTE — Telephone Encounter (Signed)
Medication Refill - Medication: Lancets (ONETOUCH DELICA PLUS [id number]) MISC ONETOUCH VERIO test strip     Preferred Pharmacy (with phone number or street name):  [place], [place] - [address] Phone:  [phone]  Fax:  [phone]       Agent: Please be advised that RX refills may take up to 3 business days. We ask that you follow-up with your pharmacy.

## 2020-07-19 ENCOUNTER — Other Ambulatory Visit: Payer: Self-pay

## 2020-07-19 MED ORDER — ONETOUCH VERIO VI STRP: ORAL_STRIP | 1 refills | Status: DC

## 2020-07-19 NOTE — Telephone Encounter (Signed)
Test strips sent to pharmacy.  KP

## 2020-07-19 NOTE — Telephone Encounter (Signed)
Pt calling again to request to have the one touch verio strips sent in for her as well. Please advise.

## 2020-07-20 DIAGNOSIS — L851 Acquired keratosis [keratoderma] palmaris et plantaris: Secondary | ICD-10-CM | POA: Diagnosis not present

## 2020-07-20 DIAGNOSIS — Q828 Other specified congenital malformations of skin: Secondary | ICD-10-CM | POA: Diagnosis not present

## 2020-07-20 DIAGNOSIS — D2372 Other benign neoplasm of skin of left lower limb, including hip: Secondary | ICD-10-CM | POA: Diagnosis not present

## 2020-07-20 DIAGNOSIS — M79672 Pain in left foot: Secondary | ICD-10-CM | POA: Diagnosis not present

## 2020-07-26 ENCOUNTER — Ambulatory Visit: Payer: Medicare Other | Admitting: Student

## 2020-07-26 ENCOUNTER — Encounter: Payer: Self-pay | Admitting: Student

## 2020-07-26 ENCOUNTER — Other Ambulatory Visit: Payer: Self-pay

## 2020-07-26 VITALS — BP 112/66 | HR 72 | Ht 67.0 in | Wt 198.0 lb

## 2020-07-26 DIAGNOSIS — I428 Other cardiomyopathies: Secondary | ICD-10-CM

## 2020-07-26 DIAGNOSIS — I4819 Other persistent atrial fibrillation: Secondary | ICD-10-CM | POA: Diagnosis not present

## 2020-07-26 DIAGNOSIS — Z79899 Other long term (current) drug therapy: Secondary | ICD-10-CM

## 2020-07-26 DIAGNOSIS — I952 Hypotension due to drugs: Secondary | ICD-10-CM

## 2020-07-26 DIAGNOSIS — E118 Type 2 diabetes mellitus with unspecified complications: Secondary | ICD-10-CM

## 2020-07-26 NOTE — Progress Notes (Signed)
PCP:  Glean Hess, MD Primary Cardiologist: Virl Axe, MD  Brandy [name] is a 65 y.o. female seen today for Virl Axe, MD for routine electrophysiology followup since restarting on midodrine.  Since last being seen in our clinic the patient reports doing better.  Her BP has been much better managed on midodrine. She has had pressures in the 90-110s for the post part. She is taking magnesium which has helped with her HR excursions. She is able to walk on the TM for 30 minutes with HRs up to the 120 range, she then slows down herself.  she denies chest pain, dyspnea, PND, orthopnea, nausea, vomiting, syncope, edema, weight gain, or early satiety.  Past Medical History:  Diagnosis Date  . Chicken pox   . Chronic systolic CHF (congestive heart failure) ([place])    a. 03/2016 Echo: Ef 15-20%, diff HK, ant AK;  b. 05/2016 Echo: Ef 30-35%, diff HK, mildly dil LA/RA.  . Diabetes mellitus without complication ([place])   . GERD (gastroesophageal reflux disease)   . Heart murmur   . Hip discomfort    been going on for 40 years   . History of kidney stones   . Moderate mitral regurgitation    a. 03/2016 Echo: mod MR in setting of LV dysfxn.  . Motion sickness    car - back seat  . NICM (nonischemic cardiomyopathy) ([place])    a. 03/2016 Echo: EF 15-20%, diff HK, ant AK, mod MR, mod dil LA, mildly dil RA;  b. 04/2016 Cath: nl cors;  c. 05/2016 Echo: EF 30-35%, diff HK.  . Obesity   . Obstructive sleep apnea    compliant with CPAP  . Persistent atrial fibrillation ([place])    a. Dx 03/2016;  b. CHA2DS2VASc = 2-->Eliquis 5mg  BID;  b. 05/2016 Failed DCCV x 4.  . Sleep apnea   . Visit for monitoring Tikosyn therapy 07/24/2016   Past Surgical History:  Procedure Laterality Date  . BIOPSY N/A 01/12/2020   Procedure: BIOPSY;  Surgeon: Virgel Manifold, MD;  Location: [place];  Service: Endoscopy;  Laterality: N/A;  . CARDIAC CATHETERIZATION N/A 04/17/2016   Procedure: Left Heart Cath and  Coronary Angiography;  Surgeon: Wellington Hampshire, MD;  Location: [place] [facility];  Service: Cardiovascular;  Laterality: N/A;  . CHOLECYSTECTOMY    . COLONOSCOPY N/A 01/20/2020   Procedure: COLONOSCOPY;  Surgeon: Lesly Rubenstein, MD;  Location[facility] END[facility]COPY;  Service: Endoscopy;  Laterality: N/A;  . COLONOSCOPY WITH PROPOFOL N/A 01/12/2020   Procedure: COLONOSCOPY WITH PROPOFOL;  Surgeon: Virgel Manifold, MD;  Location: [place];  Service: Endoscopy;  Laterality: N/A;  Diabetic - oral meds sleep apnea  . ELECTROPHYSIOLOGIC STUDY N/A 05/26/2016   Procedure: Cardioversion;  Surgeon: Wellington Hampshire, MD;  Location: [facility];  Service: Cardiovascular;  Laterality: N/A;  . ESOPHAGOGASTRODUODENOSCOPY (EGD) WITH PROPOFOL N/A 01/12/2020   Procedure: ESOPHAGOGASTRODUODENOSCOPY (EGD) WITH PROPOFOL;  Surgeon: Virgel Manifold, MD;  Location: [place];  Service: Endoscopy;  Laterality: N/A;  . POLYPECTOMY N/A 01/12/2020   Procedure: POLYPECTOMY;  Surgeon: Virgel Manifold, MD;  Location: [place];  Service: Endoscopy;  Laterality: N/A;    Current Outpatient Medications  Medication Sig Dispense Refill  . Cholecalciferol (VITAMIN D3) 1000 units CAPS Take 1 capsule by mouth 2 (two) times daily.     [name] ELIQUIS 5 MG TABS tablet Take 1 tablet by mouth twice daily 180 tablet 1  . Flavoring Agent (  LEMON FLAVOR) OIL Take by mouth.    . furosemide (LASIX) 20 MG tablet Take 1 tablet (20 mg total) by mouth daily as needed for fluid or edema. 30 tablet 0  . glucose blood (ONETOUCH VERIO) test strip USE UP TO 4 TIMES DAILY AS DIRECTED 100 each 1  . Lancets (ONETOUCH DELICA PLUS [id number]) MISC UP TO 4 TIMES DAILY AS DIRECTED 100 each 1  . Lavender Oil OIL Apply 1 application topically daily as needed.     Koren Bound Oil OIL Take 1 application by mouth daily as needed.     Cindy Hazy Oil OIL Apply 1 application topically daily as needed.     [name] lisinopril (ZESTRIL) 2.5  MG tablet Take 1 tablet (2.5 mg total) by mouth daily. 90 tablet 3  . Magnesium Bisglycinate (MAG GLYCINATE) 100 MG TABS Take 400 mg by mouth daily.    . metFORMIN (GLUCOPHAGE-XR) 500 MG 24 hr tablet Take 1/2 tablet by mouth twice a day 90 tablet 3  . metoprolol tartrate (LOPRESSOR) 50 MG tablet Take 1 tablet (50 mg total) by mouth 2 (two) times daily. 180 tablet 3  . midodrine (PROAMATINE) 2.5 MG tablet Take 1 tablet (2.5 mg) by mouth three times a day at 6 am, 10 am, & 2 pm 270 tablet 2  . Multiple Vitamins-Minerals (MULTIVITAMIN ADULT EXTRA C PO) Take 1 tablet by mouth daily.    . Multiple Vitamins-Minerals (MULTIVITAMIN WITH MINERALS) tablet Take 1 tablet by mouth daily.     . Oil of Oregano 1500 MG CAPS Take by mouth.    . peppermint oil liquid Apply 1 application topically daily as needed.     . Tea Tree Oil OIL Apply topically.    . vitamin C (ASCORBIC ACID) 500 MG tablet Take 500 mg by mouth daily.     [name] zinc gluconate 50 MG tablet Take 50 mg by mouth daily.     No current facility-administered medications for this visit.    No Known Allergies  Social History   Socioeconomic History  . Marital status: Widowed    Spouse name: Deceased  . Number of children: 1  . Years of education: Not on file  . Highest education level: Not on file  Occupational History  . Not on file  Tobacco Use  . Smoking status: Never Smoker  . Smokeless tobacco: Never Used  Vaping Use  . Vaping Use: Never used  Substance and Sexual Activity  . Alcohol use: Not Currently  . Drug use: No  . Sexual activity: Not Currently    Partners: Male  Other Topics Concern  . Not on file  Social History Narrative   1 by birth 2 adopted   Social Determinants of Health   Financial Resource Strain: Not on file  Food Insecurity: Not on file  Transportation Needs: Not on file  Physical Activity: Not on file  Stress: Not on file  Social Connections: Not on file  Intimate Partner Violence: Not on file    Review of Systems: All other systems reviewed and are otherwise negative except as noted above.  Physical Exam: Vitals:   07/26/20 1044  BP: 112/66  Pulse: 72  SpO2: 100%  Weight: 198 lb (89.8 kg)  Height: 5\' 7"  (1.702 m)    GEN- The patient is well appearing, alert and oriented x 3 today.   HEENT: normocephalic, atraumatic; sclera clear, conjunctiva pink; hearing intact; oropharynx clear; neck supple, no JVP Lymph- no cervical lymphadenopathy Lungs- Clear to  ausculation bilaterally, normal work of breathing.  No wheezes, rales, rhonchi Heart- Regular rate and rhythm, no murmurs, rubs or gallops, PMI not laterally displaced GI- soft, non-tender, non-distended, bowel sounds present, no hepatosplenomegaly Extremities- no clubbing, cyanosis, or edema; DP/PT/radial pulses 2+ bilaterally MS- no significant deformity or atrophy Skin- warm and dry, no rash or lesion Psych- euthymic mood, full affect Neuro- strength and sensation are intact  EKG is not ordered.   Additional studies reviewed include: Dr. Aquilla Hacker notes  Assessment and Plan:  Atrial fibrillation-permanent  Cardiomyopathy-- nonischemic-rate related resolved  Anemia  Obesity resolving  Dr. Caryl Comes and she have discussed [facility] and plan to revisit at next office visit. She is also in AFib permanent v long standing persistent and ? Ablation candidacy. CHA2DS2VASC of 3 (Female, DM, and 64). She is on metformin for her diabetes. (possible plus 1 with h/o NICM though EF had recovered, repeat Echo pending and +/- official diagnosis of HTN, but CHA2DS2VASC already >2 without either of these.)  Her HR excursions are now few and far between. She would like to defer monitoring and I agree. She can call back if she changes her mind.   Hgb stable.   BP had been low, now back on midodrine.   Keep call back with Dr. Caryl Comes 5/22. After echo to review candidacy for ablation.     Will add labs to Echo visit so that these can  be reviewed at Dr. Aquilla Hacker visit.    Shirley Friar, PA-C  07/26/20 12:38 PM

## 2020-07-26 NOTE — Patient Instructions (Signed)
Medication Instructions:  Your physician recommends that you continue on your current medications as directed. Please refer to the Current Medication list given to you today.  *If you need a refill on your cardiac medications before your next appointment, please call your pharmacy*   Lab Work: None If you have labs (blood work) drawn today and your tests are completely normal, you will receive your results only by: [name] MyChart Message (if you have MyChart) OR . A paper copy in the mail If you have any lab test that is abnormal or we need to change your treatment, we will call you to review the results.  Follow-Up: At [facility], you and your health needs are our priority.  As part of our continuing mission to provide you with exceptional heart care, we have created designated Provider Care Teams.  These Care Teams include your primary Cardiologist (physician) and Advanced Practice Providers (APPs -  Physician Assistants and Nurse Practitioners) who all work together to provide you with the care you need, when you need it.  Your next appointment:   11/01/2020  The format for your next appointment:   In Person  Provider:   Virl Axe, MD

## 2020-07-29 ENCOUNTER — Encounter: Payer: Self-pay | Admitting: *Deleted

## 2020-08-02 ENCOUNTER — Encounter: Payer: Self-pay | Admitting: Internal Medicine

## 2020-08-02 ENCOUNTER — Ambulatory Visit (INDEPENDENT_AMBULATORY_CARE_PROVIDER_SITE_OTHER): Payer: Medicare Other | Admitting: Internal Medicine

## 2020-08-02 ENCOUNTER — Other Ambulatory Visit: Payer: Self-pay

## 2020-08-02 VITALS — BP 130/74 | HR 106 | Temp 98.6°F | Ht 67.0 in | Wt 198.0 lb

## 2020-08-02 DIAGNOSIS — R059 Cough, unspecified: Secondary | ICD-10-CM

## 2020-08-02 DIAGNOSIS — Z20822 Contact with and (suspected) exposure to covid-19: Secondary | ICD-10-CM

## 2020-08-02 MED ORDER — AZITHROMYCIN 250 MG PO TABS: ORAL_TABLET | ORAL | 0 refills | Status: AC

## 2020-08-02 MED ORDER — PROMETHAZINE-DM 6.25-15 MG/5ML PO SYRP: 5.0000 mL | ORAL_SOLUTION | Freq: Four times a day (QID) | ORAL | 0 refills | Status: DC | PRN

## 2020-08-02 MED ORDER — ALBUTEROL SULFATE HFA 108 (90 BASE) MCG/ACT IN AERS: 2.0000 | INHALATION_SPRAY | Freq: Four times a day (QID) | RESPIRATORY_TRACT | 0 refills | Status: DC | PRN

## 2020-08-02 NOTE — Progress Notes (Signed)
Date:  08/02/2020   Name:  Brandy [name]   DOB:  08/01/1955   MRN:  [id number]   Chief Complaint: Cough (X4 days,Body aches, diarrhea, sore throat, runny nose 5 months, ear hurts, cough with clear mucous)  Cough This is a new problem. The current episode started in the past 7 days. The problem has been gradually worsening. The problem occurs every few minutes. The cough is productive of sputum (clear mucus now turning yellow). Associated symptoms include chills, ear congestion, nasal congestion, postnasal drip, a sore throat, shortness of breath, sweats and wheezing. Pertinent negatives include no chest pain, fever or headaches. Treatments tried: antihistamine. The treatment provided no relief. Her past medical history is significant for environmental allergies.    Lab Results  Component Value Date   CREATININE 0.78 01/24/2020   BUN 11 01/24/2020   NA 139 01/24/2020   K 3.7 01/24/2020   CL 105 01/24/2020   CO2 26 01/24/2020   Lab Results  Component Value Date   CHOL 191 10/31/2016   HDL 41.30 10/31/2016   LDLDIRECT 131.0 10/31/2016   TRIG 216.0 (H) 10/31/2016   CHOLHDL 5 10/31/2016   Lab Results  Component Value Date   TSH 1.880 10/20/2019   Lab Results  Component Value Date   HGBA1C 6.1 (H) 05/24/2020   Lab Results  Component Value Date   WBC 8.0 05/24/2020   HGB 12.0 05/24/2020   HCT 36.6 05/24/2020   MCV 86 05/24/2020   PLT 336 05/24/2020   Lab Results  Component Value Date   ALT 28 01/18/2020   AST 27 01/18/2020   ALKPHOS 72 01/18/2020   BILITOT 0.7 01/18/2020     Review of Systems  Constitutional: Positive for chills. Negative for fever.  HENT: Positive for congestion, postnasal drip and sore throat. Negative for trouble swallowing.   Eyes: Negative for visual disturbance.  Respiratory: Positive for cough, shortness of breath and wheezing.   Cardiovascular: Negative for chest pain, palpitations and leg swelling.  Gastrointestinal: Positive for diarrhea  (alternating with formed stools). Negative for abdominal pain.  Allergic/Immunologic: Positive for environmental allergies.  Neurological: Negative for numbness and headaches.    Patient Active Problem List   Diagnosis Date Noted  . Varicose veins of leg with pain, bilateral 07/06/2020  . Acquired thrombophilia ([place]) 05/24/2020  . Morbid (severe) obesity due to excess calories ([place]) 02/06/2020  . Hypotension 01/24/2020  . Chronic anticoagulation 01/20/2020  . Acute lower GI bleeding 01/19/2020  . Atrial fibrillation, chronic ([place]) 01/19/2020  . Diabetes mellitus type 2 in obese ([place]) 01/19/2020  . Gastric polyp   . Stomach irritation   . Special screening for malignant neoplasms, colon   . Colon polyps   . Nasal sinus congestion 10/20/2019  . Weight loss, non-intentional 10/20/2019  . Hyperglycemia 10/20/2019  . Varicose veins of both lower extremities 10/20/2019  . Dysphagia 10/20/2019  . Osteoarthritis of right hand 10/31/2016  . Persistent atrial fibrillation ([place])   . Atrial fibrillation with rapid ventricular response ([place])   . Nonischemic cardiomyopathy ([place]) 04/14/2016  . BMI 33.0-33.9,adult 04/14/2016    No Known Allergies  Past Surgical History:  Procedure Laterality Date  . BIOPSY N/A 01/12/2020   Procedure: BIOPSY;  Surgeon: Virgel Manifold, MD;  Location: [place];  Service: Endoscopy;  Laterality: N/A;  . CARDIAC CATHETERIZATION N/A 04/17/2016   Procedure: Left Heart Cath and Coronary Angiography;  Surgeon: Wellington Hampshire, MD;  Location: [place] [facility];  Service: Cardiovascular;  Laterality: N/A;  . CHOLECYSTECTOMY    . COLONOSCOPY N/A 01/20/2020   Procedure: COLONOSCOPY;  Surgeon: Lesly Rubenstein, MD;  Location[facility] ENDOSCOPY;  Service: Endoscopy;  Laterality: N/A;  . COLONOSCOPY WITH PROPOFOL N/A 01/12/2020   Procedure: COLONOSCOPY WITH PROPOFOL;  Surgeon: Virgel Manifold, MD;  Location: [facility];  Service: Endoscopy;   Laterality: N/A;  Diabetic - oral meds sleep apnea  . ELECTROPHYSIOLOGIC STUDY N/A 05/26/2016   Procedure: Cardioversion;  Surgeon: Wellington Hampshire, MD;  Location: [facility];  Service: Cardiovascular;  Laterality: N/A;  . ESOPHAGOGASTRODUODENOSCOPY (EGD) WITH PROPOFOL N/A 01/12/2020   Procedure: ESOPHAGOGASTRODUODENOSCOPY (EGD) WITH PROPOFOL;  Surgeon: Virgel Manifold, MD;  Location: [place];  Service: Endoscopy;  Laterality: N/A;  . POLYPECTOMY N/A 01/12/2020   Procedure: POLYPECTOMY;  Surgeon: Virgel Manifold, MD;  Location: [place];  Service: Endoscopy;  Laterality: N/A;    Social History   Tobacco Use  . Smoking status: Never Smoker  . Smokeless tobacco: Never Used  Vaping Use  . Vaping Use: Never used  Substance Use Topics  . Alcohol use: Not Currently  . Drug use: No     Medication list has been reviewed and updated.  Current Meds  Medication Sig  . Cholecalciferol (VITAMIN D3) 1000 units CAPS Take 1 capsule by mouth 2 (two) times daily.   [name] ELIQUIS 5 MG TABS tablet Take 1 tablet by mouth twice daily  . Flavoring Agent (LEMON FLAVOR) OIL Take by mouth.  . furosemide (LASIX) 20 MG tablet Take 1 tablet (20 mg total) by mouth daily as needed for fluid or edema.  [name] glucose blood (ONETOUCH VERIO) test strip USE UP TO 4 TIMES DAILY AS DIRECTED  . Lancets (ONETOUCH DELICA PLUS [id number]) MISC UP TO 4 TIMES DAILY AS DIRECTED  . Lavender Oil OIL Apply 1 application topically daily as needed.   Koren Bound Oil OIL Take 1 application by mouth daily as needed.   Cindy Hazy Oil OIL Apply 1 application topically daily as needed.   [name] lisinopril (ZESTRIL) 2.5 MG tablet Take 1 tablet (2.5 mg total) by mouth daily.  . Magnesium Bisglycinate (MAG GLYCINATE) 100 MG TABS Take 400 mg by mouth daily.  . metFORMIN (GLUCOPHAGE-XR) 500 MG 24 hr tablet Take 1/2 tablet by mouth twice a day  . metoprolol tartrate (LOPRESSOR) 50 MG tablet Take 1 tablet (50 mg total) by  mouth 2 (two) times daily.  . midodrine (PROAMATINE) 2.5 MG tablet Take 1 tablet (2.5 mg) by mouth three times a day at 6 am, 10 am, & 2 pm  . Multiple Vitamins-Minerals (MULTIVITAMIN ADULT EXTRA C PO) Take 1 tablet by mouth daily.  . Oil of Oregano 1500 MG CAPS Take by mouth.  . peppermint oil liquid Apply 1 application topically daily as needed.   . Tea Tree Oil OIL Apply topically.  . vitamin C (ASCORBIC ACID) 500 MG tablet Take 500 mg by mouth daily.   [name] zinc gluconate 50 MG tablet Take 50 mg by mouth daily.    PHQ 2/9 Scores 08/02/2020 05/24/2020 11/25/2019 10/24/2019  PHQ - 2 Score 0 0 0 0  PHQ- 9 Score 4 0 - 0    GAD 7 : Generalized Anxiety Score 08/02/2020 05/24/2020 10/24/2019 10/20/2019  Nervous, Anxious, on Edge 0 0 1 1  Control/stop worrying 0 0 0 0  Worry too much - different things 0 0 0 0  Trouble relaxing 0 0 0 0  Restless 0 0 0 0  Easily annoyed or irritable 0 0 0 0  Afraid - awful might happen 0 0 0 0  Total GAD 7 Score 0 0 1 1  Anxiety Difficulty - Not difficult at all Not difficult at all Not difficult at all    BP Readings from Last 3 Encounters:  08/02/20 130/74  07/26/20 112/66  07/06/20 116/74    Physical Exam Constitutional:      Appearance: She is ill-appearing.  HENT:     Right Ear: Tympanic membrane and ear canal normal.     Left Ear: Tympanic membrane and ear canal normal.     Nose:     Right Sinus: No maxillary sinus tenderness or frontal sinus tenderness.     Left Sinus: No maxillary sinus tenderness or frontal sinus tenderness.     Mouth/Throat:     Pharynx: Posterior oropharyngeal erythema present. No oropharyngeal exudate.  Cardiovascular:     Rate and Rhythm: Tachycardia present. Rhythm irregular.     Pulses: Normal pulses.  Pulmonary:     Effort: Pulmonary effort is normal.     Breath sounds: Normal air entry. Examination of the right-upper field reveals wheezing. Examination of the left-upper field reveals wheezing. Wheezing present. No  rhonchi.  Musculoskeletal:     Cervical back: Normal range of motion.     Right lower leg: No edema.     Left lower leg: No edema.  Lymphadenopathy:     Cervical: No cervical adenopathy.  Neurological:     Mental Status: She is alert.     Wt Readings from Last 3 Encounters:  08/02/20 198 lb (89.8 kg)  07/26/20 198 lb (89.8 kg)  07/06/20 194 lb (88 kg)    BP 130/74   Pulse (!) 106   Temp 98.6 F (37 C) (Oral)   Ht 5\' 7"  (1.702 m)   Wt 198 lb (89.8 kg)   SpO2 96%   BMI 31.01 kg/m   Assessment and Plan: 1. Suspected COVID-19 virus infection May also be side effects from the booster vaccine she received last week - Novel Coronavirus, NAA ([facility])  2. Cough Suspect sinus vs early bronchitis VS are good - will treat with broad spectrum antibiotics and cough suppressant Albuterol MDI to use as needed for wheezing Push fluids Call if not improving - promethazine-dextromethorphan (PROMETHAZINE-DM) 6.25-15 MG/5ML syrup; Take 5 mLs by mouth 4 (four) times daily as needed for cough.  Dispense: 118 mL; Refill: 0 - azithromycin (ZITHROMAX Z-PAK) 250 MG tablet; UAD  Dispense: 6 each; Refill: 0 - albuterol (VENTOLIN HFA) 108 (90 Base) MCG/ACT inhaler; Inhale 2 puffs into the lungs every 6 (six) hours as needed for wheezing or shortness of breath.  Dispense: 1 each; Refill: 0   Partially dictated using Editor, commissioning. Any errors are unintentional.  Halina Maidens, MD [facility]  08/02/2020

## 2020-08-02 NOTE — Patient Instructions (Signed)
Stop lisinopril for now.

## 2020-08-03 LAB — SARS-COV-2, NAA 2 DAY TAT

## 2020-08-03 LAB — NOVEL CORONAVIRUS, NAA: SARS-CoV-2, NAA: NOT DETECTED

## 2020-09-20 ENCOUNTER — Telehealth: Payer: Self-pay | Admitting: Internal Medicine

## 2020-09-20 NOTE — Telephone Encounter (Addendum)
Patient scheduled with Health Advisor on 09/22/2020 in office for her AWV

## 2020-09-20 NOTE — Telephone Encounter (Signed)
Copied from [place] [id number]. Topic: Medicare AWV >> Sep 20, 2020  1:58 PM Cher Nakai R wrote: Reason for CRM:  Left message for patient to call back and schedule Medicare Annual Wellness Visit (AWV) in office.   If unable to come into the office for AWV,  please offer to do virtually or by telephone.  No hx of AWV eligible as of 08/13/2020  Please schedule at anytime with [facility] Health Advisor.      40 Minutes appointment   Any questions, please call me at [phone]

## 2020-09-21 ENCOUNTER — Other Ambulatory Visit: Payer: Self-pay

## 2020-09-21 ENCOUNTER — Other Ambulatory Visit: Payer: Medicare Other | Admitting: *Deleted

## 2020-09-21 ENCOUNTER — Ambulatory Visit (HOSPITAL_COMMUNITY): Payer: Medicare Other | Attending: Cardiology

## 2020-09-21 DIAGNOSIS — I428 Other cardiomyopathies: Secondary | ICD-10-CM

## 2020-09-21 DIAGNOSIS — I4819 Other persistent atrial fibrillation: Secondary | ICD-10-CM

## 2020-09-21 DIAGNOSIS — Z79899 Other long term (current) drug therapy: Secondary | ICD-10-CM | POA: Diagnosis not present

## 2020-09-21 DIAGNOSIS — I952 Hypotension due to drugs: Secondary | ICD-10-CM

## 2020-09-21 DIAGNOSIS — E118 Type 2 diabetes mellitus with unspecified complications: Secondary | ICD-10-CM

## 2020-09-21 LAB — CBC
Hematocrit: 47.8 % — ABNORMAL HIGH (ref 34.0–46.6)
Hemoglobin: 16.1 g/dL — ABNORMAL HIGH (ref 11.1–15.9)
MCH: 30.1 pg (ref 26.6–33.0)
MCHC: 33.7 g/dL (ref 31.5–35.7)
MCV: 89 fL (ref 79–97)
Platelets: 310 10*3/uL (ref 150–450)
RBC: 5.35 x10E6/uL — ABNORMAL HIGH (ref 3.77–5.28)
RDW: 13.2 % (ref 11.7–15.4)
WBC: 8 10*3/uL (ref 3.4–10.8)

## 2020-09-21 LAB — BASIC METABOLIC PANEL
BUN/Creatinine Ratio: 17 (ref 12–28)
BUN: 15 mg/dL (ref 8–27)
CO2: 22 mmol/L (ref 20–29)
Calcium: 10.1 mg/dL (ref 8.7–10.3)
Chloride: 102 mmol/L (ref 96–106)
Creatinine, Ser: 0.88 mg/dL (ref 0.57–1.00)
Glucose: 95 mg/dL (ref 65–99)
Potassium: 4.6 mmol/L (ref 3.5–5.2)
Sodium: 139 mmol/L (ref 134–144)
eGFR: 72 mL/min/{1.73_m2} (ref >59)

## 2020-09-21 LAB — ECHOCARDIOGRAM COMPLETE: S' Lateral: 3.1 cm

## 2020-09-21 LAB — MAGNESIUM: Magnesium: 2.3 mg/dL (ref 1.6–2.3)

## 2020-09-22 ENCOUNTER — Ambulatory Visit (INDEPENDENT_AMBULATORY_CARE_PROVIDER_SITE_OTHER): Payer: Medicare Other

## 2020-09-22 VITALS — BP 112/72 | HR 92 | Temp 98.1°F | Resp 16 | Ht 67.0 in | Wt 208.0 lb

## 2020-09-22 DIAGNOSIS — Z0000 Encounter for general adult medical examination without abnormal findings: Secondary | ICD-10-CM

## 2020-09-22 NOTE — Patient Instructions (Signed)
Brandy [name] , Thank you for taking time to come for your Medicare Wellness Visit. I appreciate your ongoing commitment to your health goals. Please review the following plan we discussed and let me know if I can assist you in the future.   Screening recommendations/referrals: Colonoscopy: done 01/20/20. Follow up with [facility] as recommended.  Mammogram: done 05/13/18; due Bone Density: done 05/13/18; due Recommended yearly ophthalmology/optometry visit for glaucoma screening and checkup Recommended yearly dental visit for hygiene and checkup  Vaccinations: Influenza vaccine: declined Pneumococcal vaccine: done 10/24/19 Tdap vaccine: due Shingles vaccine: Shingrix discussed. Please contact your pharmacy for coverage information.  Covid-19: done 02/06/20, 02/27/20, 07/29/20  Conditions/risks identified: Recommend healthy eating and physical activity for desired weight loss  Next appointment: Follow up in one year for your annual wellness visit    Preventive Care 65 Years and Older, Female Preventive care refers to lifestyle choices and visits with your health care provider that can promote health and wellness. What does preventive care include?  A yearly physical exam. This is also called an annual well check.  Dental exams once or twice a year.  Routine eye exams. Ask your health care provider how often you should have your eyes checked.  Personal lifestyle choices, including:  Daily care of your teeth and gums.  Regular physical activity.  Eating a healthy diet.  Avoiding tobacco and drug use.  Limiting alcohol use.  Practicing safe sex.  Taking low-dose aspirin every day.  Taking vitamin and mineral supplements as recommended by your health care provider. What happens during an annual well check? The services and screenings done by your health care provider during your annual well check will depend on your age, overall health, lifestyle risk factors, and family history of  disease. Counseling  Your health care provider may ask you questions about your:  Alcohol use.  Tobacco use.  Drug use.  Emotional well-being.  Home and relationship well-being.  Sexual activity.  Eating habits.  History of falls.  Memory and ability to understand (cognition).  Work and work Statistician.  Reproductive health. Screening  You may have the following tests or measurements:  Height, weight, and BMI.  Blood pressure.  Lipid and cholesterol levels. These may be checked every 5 years, or more frequently if you are over 37 years old.  Skin check.  Lung cancer screening. You may have this screening every year starting at age 66 if you have a 30-pack-year history of smoking and currently smoke or have quit within the past 15 years.  Fecal occult blood test (FOBT) of the stool. You may have this test every year starting at age 60.  Flexible sigmoidoscopy or colonoscopy. You may have a sigmoidoscopy every 5 years or a colonoscopy every 10 years starting at age 71.  Hepatitis C blood test.  Hepatitis B blood test.  Sexually transmitted disease (STD) testing.  Diabetes screening. This is done by checking your blood sugar (glucose) after you have not eaten for a while (fasting). You may have this done every 1-3 years.  Bone density scan. This is done to screen for osteoporosis. You may have this done starting at age 61.  Mammogram. This may be done every 1-2 years. Talk to your health care provider about how often you should have regular mammograms. Talk with your health care provider about your test results, treatment options, and if necessary, the need for more tests. Vaccines  Your health care provider may recommend certain vaccines, such as:  Influenza vaccine.  This is recommended every year.  Tetanus, diphtheria, and acellular pertussis (Tdap, Td) vaccine. You may need a Td booster every 10 years.  Zoster vaccine. You may need this after age  40.  Pneumococcal 13-valent conjugate (PCV13) vaccine. One dose is recommended after age 73.  Pneumococcal polysaccharide (PPSV23) vaccine. One dose is recommended after age 31. Talk to your health care provider about which screenings and vaccines you need and how often you need them. This information is not intended to replace advice given to you by your health care provider. Make sure you discuss any questions you have with your health care provider. Document Released: 05/28/2015 Document Revised: 01/19/2016 Document Reviewed: 03/02/2015 Elsevier Interactive Patient Education  2017 [place] Prevention in the Home Falls can cause injuries. They can happen to people of all ages. There are many things you can do to make your home safe and to help prevent falls. What can I do on the outside of my home?  Regularly fix the edges of walkways and driveways and fix any cracks.  Remove anything that might make you trip as you walk through a door, such as a raised step or threshold.  Trim any bushes or trees on the path to your home.  Use bright outdoor lighting.  Clear any walking paths of anything that might make someone trip, such as rocks or tools.  Regularly check to see if handrails are loose or broken. Make sure that both sides of any steps have handrails.  Any raised decks and porches should have guardrails on the edges.  Have any leaves, snow, or ice cleared regularly.  Use sand or salt on walking paths during winter.  Clean up any spills in your garage right away. This includes oil or grease spills. What can I do in the bathroom?  Use night lights.  Install grab bars by the toilet and in the tub and shower. Do not use towel bars as grab bars.  Use non-skid mats or decals in the tub or shower.  If you need to sit down in the shower, use a plastic, non-slip stool.  Keep the floor dry. Clean up any water that spills on the floor as soon as it happens.  Remove  soap buildup in the tub or shower regularly.  Attach bath mats securely with double-sided non-slip rug tape.  Do not have throw rugs and other things on the floor that can make you trip. What can I do in the bedroom?  Use night lights.  Make sure that you have a light by your bed that is easy to reach.  Do not use any sheets or blankets that are too big for your bed. They should not hang down onto the floor.  Have a firm chair that has side arms. You can use this for support while you get dressed.  Do not have throw rugs and other things on the floor that can make you trip. What can I do in the kitchen?  Clean up any spills right away.  Avoid walking on wet floors.  Keep items that you use a lot in easy-to-reach places.  If you need to reach something above you, use a strong step stool that has a grab bar.  Keep electrical cords out of the way.  Do not use floor polish or wax that makes floors slippery. If you must use wax, use non-skid floor wax.  Do not have throw rugs and other things on the floor that can make  you trip. What can I do with my stairs?  Do not leave any items on the stairs.  Make sure that there are handrails on both sides of the stairs and use them. Fix handrails that are broken or loose. Make sure that handrails are as long as the stairways.  Check any carpeting to make sure that it is firmly attached to the stairs. Fix any carpet that is loose or worn.  Avoid having throw rugs at the top or bottom of the stairs. If you do have throw rugs, attach them to the floor with carpet tape.  Make sure that you have a light switch at the top of the stairs and the bottom of the stairs. If you do not have them, ask someone to add them for you. What else can I do to help prevent falls?  Wear shoes that:  Do not have high heels.  Have rubber bottoms.  Are comfortable and fit you well.  Are closed at the toe. Do not wear sandals.  If you use a  stepladder:  Make sure that it is fully opened. Do not climb a closed stepladder.  Make sure that both sides of the stepladder are locked into place.  Ask someone to hold it for you, if possible.  Clearly mark and make sure that you can see:  Any grab bars or handrails.  First and last steps.  Where the edge of each step is.  Use tools that help you move around (mobility aids) if they are needed. These include:  Canes.  Walkers.  Scooters.  Crutches.  Turn on the lights when you go into a dark area. Replace any light bulbs as soon as they burn out.  Set up your furniture so you have a clear path. Avoid moving your furniture around.  If any of your floors are uneven, fix them.  If there are any pets around you, be aware of where they are.  Review your medicines with your doctor. Some medicines can make you feel dizzy. This can increase your chance of falling. Ask your doctor what other things that you can do to help prevent falls. This information is not intended to replace advice given to you by your health care provider. Make sure you discuss any questions you have with your health care provider. Document Released: 02/25/2009 Document Revised: 10/07/2015 Document Reviewed: 06/05/2014 Elsevier Interactive Patient Education  2017 [employer].

## 2020-09-22 NOTE — Progress Notes (Signed)
Subjective:   Brandy [name] is a 65 y.o. female who presents for an Initial Medicare Annual Wellness Visit.  Review of Systems     Cardiac Risk Factors include: advanced age (>36men, >83 women);diabetes mellitus;hypertension     Objective:    Today's Vitals   09/22/20 1528  BP: 112/72  Pulse: 92  Resp: 16  Temp: 98.1 F (36.7 C)  TempSrc: Oral  SpO2: 98%  Weight: 208 lb (94.3 kg)  Height: 5\' 7"  (1.702 m)   Body mass index is 32.58 kg/m.  Advanced Directives 09/22/2020 01/20/2020 01/19/2020 01/19/2020 01/18/2020 11/25/2019 07/24/2016  Does Patient Have a Medical Advance Directive? Yes Yes Yes - Yes No Yes  Type of Paramedic of [place];Living will Healthcare Power of [place];Living will [place];Living will [place];Living will - Living will  Does patient want to make changes to medical advance directive? - - No - Patient declined - - - No - Patient declined  Copy of [place] in Chart? Yes - validated most recent copy scanned in chart (See row information) - No - copy requested - - - -  Would patient like information on creating a medical advance directive? - - - - - Yes (MAU/Ambulatory/Procedural Areas - Information given) -    Current Medications (verified) Outpatient Encounter Medications as of 09/22/2020  Medication Sig  . amoxicillin (AMOXIL) 500 MG capsule Take 500 mg by mouth every 8 (eight) hours.  . chlorhexidine (PERIDEX) 0.12 % solution 2 (two) times daily.  . Cholecalciferol (VITAMIN D3) 1000 units CAPS Take 1 capsule by mouth 2 (two) times daily.   [name] ELIQUIS 5 MG TABS tablet Take 1 tablet by mouth twice daily  . furosemide (LASIX) 20 MG tablet Take 1 tablet (20 mg total) by mouth daily as needed for fluid or edema.  [name] glucose blood (ONETOUCH VERIO) test strip USE UP TO 4 TIMES DAILY AS DIRECTED  . Lancets (ONETOUCH DELICA PLUS [id number]) MISC UP TO 4 TIMES  DAILY AS DIRECTED  . Lavender Oil OIL Apply 1 application topically daily as needed.   Koren Bound Oil OIL Take 1 application by mouth daily as needed.   Cindy Hazy Oil OIL Apply 1 application topically daily as needed.   . Magnesium Bisglycinate (MAG GLYCINATE) 100 MG TABS Take 400 mg by mouth daily.  . metFORMIN (GLUCOPHAGE-XR) 500 MG 24 hr tablet Take 1/2 tablet by mouth twice a day  . metoprolol tartrate (LOPRESSOR) 50 MG tablet Take 1 tablet (50 mg total) by mouth 2 (two) times daily.  . midodrine (PROAMATINE) 2.5 MG tablet Take 1 tablet (2.5 mg) by mouth three times a day at 6 am, 10 am, & 2 pm  . Multiple Vitamins-Minerals (MULTIVITAMIN ADULT EXTRA C PO) Take 1 tablet by mouth daily.  . Oil of Oregano 1500 MG CAPS Take by mouth.  . peppermint oil liquid Apply 1 application topically daily as needed.   . Tea Tree Oil OIL Apply topically.  . vitamin C (ASCORBIC ACID) 500 MG tablet Take 500 mg by mouth daily.   [name] zinc gluconate 50 MG tablet Take 50 mg by mouth daily.  . [DISCONTINUED] albuterol (VENTOLIN HFA) 108 (90 Base) MCG/ACT inhaler Inhale 2 puffs into the lungs every 6 (six) hours as needed for wheezing or shortness of breath.  . [DISCONTINUED] Flavoring Agent (LEMON FLAVOR) OIL Take by mouth.  . [DISCONTINUED] lisinopril (ZESTRIL) 2.5 MG tablet Take 1  tablet (2.5 mg total) by mouth daily.  . [DISCONTINUED] promethazine-dextromethorphan (PROMETHAZINE-DM) 6.25-15 MG/5ML syrup Take 5 mLs by mouth 4 (four) times daily as needed for cough.   No facility-administered encounter medications on file as of 09/22/2020.    Allergies (verified) Patient has no known allergies.   History: Past Medical History:  Diagnosis Date  . Arthritis   . Chicken pox   . Chronic systolic CHF (congestive heart failure) ([place])    a. 03/2016 Echo: Ef 15-20%, diff HK, ant AK;  b. 05/2016 Echo: Ef 30-35%, diff HK, mildly dil LA/RA.  . Diabetes mellitus without complication ([place])   . GERD (gastroesophageal  reflux disease)   . Heart murmur   . Hip discomfort    been going on for 40 years   . History of kidney stones   . Hypertension   . Moderate mitral regurgitation    a. 03/2016 Echo: mod MR in setting of LV dysfxn.  . Motion sickness    car - back seat  . NICM (nonischemic cardiomyopathy) ([place])    a. 03/2016 Echo: EF 15-20%, diff HK, ant AK, mod MR, mod dil LA, mildly dil RA;  b. 04/2016 Cath: nl cors;  c. 05/2016 Echo: EF 30-35%, diff HK.  . Obesity   . Obstructive sleep apnea    compliant with CPAP  . Persistent atrial fibrillation ([place])    a. Dx 03/2016;  b. CHA2DS2VASc = 2-->Eliquis 5mg  BID;  b. 05/2016 Failed DCCV x 4.  . Sleep apnea   . Visit for monitoring Tikosyn therapy 07/24/2016   Past Surgical History:  Procedure Laterality Date  . BIOPSY N/A 01/12/2020   Procedure: BIOPSY;  Surgeon: Virgel Manifold, MD;  Location: [place];  Service: Endoscopy;  Laterality: N/A;  . CARDIAC CATHETERIZATION N/A 04/17/2016   Procedure: Left Heart Cath and Coronary Angiography;  Surgeon: [place] Hampshire, MD;  Location: [place] [facility];  Service: Cardiovascular;  Laterality: N/A;  . CHOLECYSTECTOMY    . COLON SURGERY  2021  . COLONOSCOPY N/A 01/20/2020   Procedure: COLONOSCOPY;  Surgeon: Lesly Rubenstein, MD;  Location[facility] ENDOSCOPY;  Service: Endoscopy;  Laterality: N/A;  . COLONOSCOPY WITH PROPOFOL N/A 01/12/2020   Procedure: COLONOSCOPY WITH PROPOFOL;  Surgeon: Virgel Manifold, MD;  Location: [place];  Service: Endoscopy;  Laterality: N/A;  Diabetic - oral meds sleep apnea  . ELECTROPHYSIOLOGIC STUDY N/A 05/26/2016   Procedure: Cardioversion;  Surgeon: [place] Hampshire, MD;  Location: [facility];  Service: Cardiovascular;  Laterality: N/A;  . ESOPHAGOGASTRODUODENOSCOPY (EGD) WITH PROPOFOL N/A 01/12/2020   Procedure: ESOPHAGOGASTRODUODENOSCOPY (EGD) WITH PROPOFOL;  Surgeon: Virgel Manifold, MD;  Location: [place];  Service: Endoscopy;   Laterality: N/A;  . POLYPECTOMY N/A 01/12/2020   Procedure: POLYPECTOMY;  Surgeon: Virgel Manifold, MD;  Location: [place];  Service: Endoscopy;  Laterality: N/A;   Family History  Problem Relation Age of Onset  . COPD Mother   . Anxiety disorder Mother   . Arthritis Mother   . Asthma Mother   . Depression Mother   . COPD Father   . Heart disease Father   . Hypertension Father   . Arthritis Father   . Varicose Veins Father   . Diabetes Paternal Aunt   . Cancer Maternal Aunt    Social History   Socioeconomic History  . Marital status: Widowed    Spouse name: Deceased  . Number of children: 1  . Years of education: Not  on file  . Highest education level: Not on file  Occupational History  . Not on file  Tobacco Use  . Smoking status: Never Smoker  . Smokeless tobacco: Never Used  Vaping Use  . Vaping Use: Never used  Substance and Sexual Activity  . Alcohol use: Never  . Drug use: Never  . Sexual activity: Not Currently    Partners: Male  Other Topics Concern  . Not on file  Social History Narrative   1 by birth 2 adopted   Pt lives alone   Social Determinants of Health   Financial Resource Strain: Low Risk   . Difficulty of Paying Living Expenses: Not hard at all  Food Insecurity: No Food Insecurity  . Worried About Charity fundraiser in the Last Year: Never true  . Ran Out of Food in the Last Year: Never true  Transportation Needs: No Transportation Needs  . Lack of Transportation (Medical): No  . Lack of Transportation (Non-Medical): No  Physical Activity: Insufficiently Active  . Days of Exercise per Week: 2 days  . Minutes of Exercise per Session: 60 min  Stress: No Stress Concern Present  . Feeling of Stress : Only a little  Social Connections: Moderately Integrated  . Frequency of Communication with Friends and Family: More than three times a week  . Frequency of Social Gatherings with Friends and Family: More than three times a week   . Attends Religious Services: More than 4 times per year  . Active Member of Clubs or Organizations: Yes  . Attends Archivist Meetings: More than 4 times per year  . Marital Status: Widowed    Tobacco Counseling Counseling given: Not Answered   Clinical Intake:  Pre-visit preparation completed: Yes  Pain : No/denies pain     BMI - recorded: 32.58 Nutritional Status: BMI > 30  Obese Nutritional Risks: None Diabetes: Yes CBG done?: No Did pt. bring in CBG monitor from home?: No  How often do you need to have someone help you when you read instructions, pamphlets, or other written materials from your doctor or pharmacy?: 1 - Never  Nutrition Risk Assessment:  Has the patient had any N/V/D within the last 2 months?  No  Does the patient have any non-healing wounds?  No  Has the patient had any unintentional weight loss or weight gain?  No   Diabetes:  Is the patient diabetic?  Yes  If diabetic, was a CBG obtained today?  No  Did the patient bring in their glucometer from home?  No  How often do you monitor your CBG's? 1-2 times daily.   Financial Strains and Diabetes Management:  Are you having any financial strains with the device, your supplies or your medication? No .  Does the patient want to be seen by Chronic Care Management for management of their diabetes?  No  Would the patient like to be referred to a Nutritionist or for Diabetic Management?  No   Diabetic Exams:  Diabetic Eye Exam: Completed 01/14/20.  Diabetic Foot Exam: Completed 02/06/20.     Interpreter Needed?: No  Information entered by :: Clemetine Marker LPN   Activities of Daily Living In your present state of health, do you have any difficulty performing the following activities: 09/22/2020 01/19/2020  Hearing? N -  Comment declines hearing aids -  Vision? N -  Difficulty concentrating or making decisions? N -  Walking or climbing stairs? N -  Dressing or bathing? N -  Doing  errands, shopping? N N  Preparing Food and eating ? N -  Using the Toilet? N -  In the past six months, have you accidently leaked urine? N -  Do you have problems with loss of bowel control? N -  Managing your Medications? N -  Managing your Finances? N -  Housekeeping or managing your Housekeeping? N -  Some recent data might be hidden    Patient Care Team: Glean Hess, MD as PCP - General (Internal Medicine) Deboraha Sprang, MD as Consulting Physician (Cardiology) Elgie Collard, MD as Referring Physician (Obstetrics and Gynecology) Lesly Rubenstein, MD as Consulting Physician (Gastroenterology) Lucky Cowboy Erskine Squibb, MD as Referring Physician (Vascular Surgery) Caroline More, DPM as Consulting Physician (Podiatry) Arelia Sneddon, OD (Optometry)  Indicate any recent Medical Services you may have received from other than [facility] providers in the past year (date may be approximate).     Assessment:   This is a routine wellness examination for [facility].  Hearing/Vision screen  Hearing Screening   125Hz  250Hz  500Hz  1000Hz  2000Hz  3000Hz  4000Hz  6000Hz  8000Hz   Right ear:           Left ear:           Comments: Pt denies hearing difficulty  Vision Screening Comments: Annual vision screenings done at [facility] Dr. Annamaria [facility]  Dietary issues and exercise activities discussed: Current Exercise Habits: Home exercise routine, Type of exercise: strength training/weights;calisthenics, Time (Minutes): 60, Frequency (Times/Week): 2, Weekly Exercise (Minutes/Week): 120, Intensity: Moderate, Exercise limited by: None identified  Goals Addressed            This Visit's Progress   . Weight (lb) < 190 lb (86.2 kg)   208 lb (94.3 kg)    Pt states she would like to lose weight with healthy eating and physical activity       Depression Screen PHQ 2/9 Scores 09/22/2020 08/02/2020 05/24/2020 11/25/2019 10/24/2019 10/20/2019  PHQ - 2 Score 2 0 0 0 0 0  PHQ- 9 Score 3 4 0 - 0 0    Fall  Risk Fall Risk  09/22/2020 08/02/2020 05/24/2020 05/04/2020 12/15/2019  Falls in the past year? 0 0 0 0 0  Number falls in past yr: 0 - 0 - -  Injury with Fall? 0 - 0 - -  Risk for fall due to : No Fall Risks - - - -  Follow up Falls prevention discussed Falls evaluation completed Falls evaluation completed - -    FALL RISK PREVENTION PERTAINING TO THE HOME:  Any stairs in or around the home? Yes  If so, are there any without handrails? No  Home free of loose throw rugs in walkways, pet beds, electrical cords, etc? Yes  Adequate lighting in your home to reduce risk of falls? Yes   ASSISTIVE DEVICES UTILIZED TO PREVENT FALLS:  Life alert? No  Use of a cane, walker or w/c? No  Grab bars in the bathroom? No  Shower chair or bench in shower? No  Elevated toilet seat or a handicapped toilet? No   TIMED UP AND GO:  Was the test performed? Yes .  Length of time to ambulate 10 feet: 4 sec.   Gait steady and fast without use of assistive device  Cognitive Function: Normal cognitive status assessed by direct observation by this Nurse Health Advisor. No abnormalities found.          Immunizations Immunization History  Administered Date(s) Administered  . PFIZER(Purple Top)SARS-COV-2  Vaccination 02/06/2020, 02/27/2020, 07/29/2020  . Pneumococcal Conjugate-13 10/24/2019  . Td 12/10/2001    TDAP status: Due, Education has been provided regarding the importance of this vaccine. Advised may receive this vaccine at local pharmacy or Health Dept. Aware to provide a copy of the vaccination record if obtained from local pharmacy or Health Dept. Verbalized acceptance and understanding.  Flu Vaccine status: Declined, Education has been provided regarding the importance of this vaccine but patient still declined. Advised may receive this vaccine at local pharmacy or Health Dept. Aware to provide a copy of the vaccination record if obtained from local pharmacy or Health Dept. Verbalized acceptance  and understanding.  Pneumococcal vaccine status: Up to date  Covid-19 vaccine status: Completed vaccines  Qualifies for Shingles Vaccine? Yes   Zostavax completed No   Shingrix Completed?: No.    Education has been provided regarding the importance of this vaccine. Patient has been advised to call insurance company to determine out of pocket expense if they have not yet received this vaccine. Advised may also receive vaccine at local pharmacy or Health Dept. Verbalized acceptance and understanding.  Screening Tests Health Maintenance  Topic Date Due  . MAMMOGRAM  05/14/2020  . TETANUS/TDAP  02/05/2021 (Originally 12/11/2011)  . PNA vac Low Risk Adult (2 of 2 - PPSV23) 10/23/2020  . HEMOGLOBIN A1C  11/21/2020  . INFLUENZA VACCINE  12/13/2020  . OPHTHALMOLOGY EXAM  01/13/2021  . COLONOSCOPY (Pts 45-70yrs Insurance coverage will need to be confirmed)  01/19/2021  . COVID-19 Vaccine (4 - Booster for Pfizer series) 01/29/2021  . FOOT EXAM  02/05/2021  . URINE MICROALBUMIN  05/24/2021  . DEXA SCAN  Completed  . Hepatitis C Screening  Completed  . HPV VACCINES  Aged Out    Health Maintenance  Health Maintenance Due  Topic Date Due  . MAMMOGRAM  05/14/2020    Colorectal cancer screening: Type of screening: Colonoscopy. Completed 01/20/20. Repeat every 1 years  Mammogram status: Completed 05/13/18. Repeat every year  Bone Density status: Completed 05/13/18. Results reflect: Bone density results: OSTEOPENIA. Repeat every 2 years.  Lung Cancer Screening: (Low Dose CT Chest recommended if Age 56-80 years, 30 pack-year currently smoking OR have quit w/in 15years.) does not qualify.   Additional Screening:  Hepatitis C Screening: does qualify; Completed 10/31/16  Vision Screening: Recommended annual ophthalmology exams for early detection of glaucoma and other disorders of the eye. Is the patient up to date with their annual eye exam?  Yes  Who is the provider or what is the name of  the office in which the patient attends annual eye exams? [place] Screening: Recommended annual dental exams for proper oral hygiene  Community Resource Referral / Chronic Care Management: CRR required this visit?  No   CCM required this visit?  No      Plan:     I have personally reviewed and noted the following in the patient's chart:   . Medical and social history . Use of alcohol, tobacco or illicit drugs  . Current medications and supplements including opioid prescriptions. Patient is not currently taking opioid prescriptions. . Functional ability and status . Nutritional status . Physical activity . Advanced directives . List of other physicians . Hospitalizations, surgeries, and ER visits in previous 12 months . Vitals . Screenings to include cognitive, depression, and falls . Referrals and appointments  In addition, I have reviewed and discussed with patient certain preventive protocols, quality metrics, and best practice recommendations.  A written personalized care plan for preventive services as well as general preventive health recommendations were provided to patient.     Clemetine Marker, LPN   1/61/0960   Nurse Notes: none

## 2020-09-28 ENCOUNTER — Telehealth: Payer: Self-pay

## 2020-09-28 NOTE — Telephone Encounter (Signed)
Spoke with pt and advised per Dr Caryl Comes echo shows normal heart muscle function and normal LA size.  Pt will receive call from [facility], scheduler to arrange for appointment with Dr Rayann Heman or Dr Quentin Ore.  Pt verbalizes understanding and agrees with current plan.

## 2020-09-28 NOTE — Telephone Encounter (Signed)
-----   Message from Deboraha Sprang, MD sent at 09/27/2020  3:26 PM EDT ----- Please Inform Patient Echo showed  normal#  heart muscle function with norma LA szie  Please arrange a consult with either JA or CL  to consider ablation

## 2020-10-14 ENCOUNTER — Telehealth: Payer: Self-pay | Admitting: Internal Medicine

## 2020-10-14 NOTE — Telephone Encounter (Signed)
STAT if HR is under 50 or over 120 (normal HR is 60-100 beats per minute)  1) What is your heart rate? 111  2) Do you have a log of your heart rate readings (document readings)? 144, 111, 157  3) Do you have any other symptoms? Frequent palpitations, shaky

## 2020-10-14 NOTE — Telephone Encounter (Signed)
PT with known chronic AFIB and AFIB with RVR.  Spoke with pt who reports she has had episodes of HR running between 111 and 157 bpm over the past week.  Pt reports current resting HR is 102.  She has been taking an extra Metoprolol 50mg  1/2 tablet mid day but continues to have difficulty controlling her HR.  She denies any additional symptoms other that increased anxiousness.  B/P has been WNL and she is staying well hydrated.  She reports she is compliant with medications.  Pt does have an appointment with Dr Quentin Ore 10/20/2020 to discuss AFIB ablation and watchman device.  Pt advised Dr Caryl Comes is out of the office for the next couple of weeks and she requests that someone provide her with further advisement.  Will forward information to Tommye Standard, PA-C for further review.  Reviewed ED precautions.  Pt verbalizes understanding and agrees with current plan.

## 2020-10-15 ENCOUNTER — Ambulatory Visit (INDEPENDENT_AMBULATORY_CARE_PROVIDER_SITE_OTHER): Payer: Medicare Other | Admitting: Vascular Surgery

## 2020-10-15 ENCOUNTER — Encounter (INDEPENDENT_AMBULATORY_CARE_PROVIDER_SITE_OTHER): Payer: Self-pay | Admitting: Vascular Surgery

## 2020-10-15 ENCOUNTER — Other Ambulatory Visit: Payer: Self-pay

## 2020-10-15 VITALS — BP 104/68 | HR 79 | Resp 16 | Wt 205.0 lb

## 2020-10-15 DIAGNOSIS — I83813 Varicose veins of bilateral lower extremities with pain: Secondary | ICD-10-CM | POA: Diagnosis not present

## 2020-10-15 NOTE — Progress Notes (Signed)
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 Arthritis   . Chicken pox   . Chronic systolic CHF (congestive heart failure) ([place])    a. 03/2016 Echo: Ef 15-20%, diff HK, ant AK;  b. 05/2016 Echo: Ef 30-35%, diff HK, mildly dil LA/RA.  . Diabetes mellitus without complication ([place])   . GERD (gastroesophageal reflux disease)   . Heart murmur   . Hip discomfort    been going on for 40 years   . History of kidney stones   . Hypertension   . Moderate mitral regurgitation    a. 03/2016 Echo: mod MR in setting of LV dysfxn.  . Motion sickness    car - back seat  . NICM (nonischemic cardiomyopathy) ([place])    a. 03/2016 Echo: EF 15-20%, diff HK, ant AK, mod MR, mod dil LA, mildly dil RA;  b. 04/2016 Cath: nl cors;  c. 05/2016 Echo: EF 30-35%, diff HK.  . Obesity   . Obstructive sleep apnea    compliant with CPAP  . Persistent atrial fibrillation ([place])    a. Dx 03/2016;  b. CHA2DS2VASc = 2-->Eliquis 5mg  BID;  b. 05/2016 Failed DCCV x 4.  . Sleep apnea   . Visit for monitoring Tikosyn therapy 07/24/2016    Past Surgical History:  Procedure Laterality Date  . BIOPSY N/A 01/12/2020   Procedure: BIOPSY;  Surgeon: Virgel Manifold, MD;  Location: [place];  Service: Endoscopy;  Laterality: N/A;  . CARDIAC CATHETERIZATION N/A 04/17/2016   Procedure: Left Heart Cath and Coronary Angiography;  Surgeon: [place] Hampshire, MD;  Location: [place] CV LAB;  Service: Cardiovascular;  Laterality: N/A;  . CHOLECYSTECTOMY    . COLON SURGERY  2021  . COLONOSCOPY N/A 01/20/2020   Procedure: COLONOSCOPY;  Surgeon: Lesly Rubenstein, MD;  Location: [facility] ENDOSCOPY;  Service: Endoscopy;  Laterality: N/A;  . COLONOSCOPY WITH PROPOFOL N/A 01/12/2020   Procedure: COLONOSCOPY WITH PROPOFOL;  Surgeon: Virgel Manifold, MD;  Location: [place];  Service: Endoscopy;  Laterality: N/A;  Diabetic - oral  meds sleep apnea  . ELECTROPHYSIOLOGIC STUDY N/A 05/26/2016   Procedure: Cardioversion;  Surgeon: [place] Hampshire, MD;  Location: [facility] ORS;  Service: Cardiovascular;  Laterality: N/A;  . ESOPHAGOGASTRODUODENOSCOPY (EGD) WITH PROPOFOL N/A 01/12/2020   Procedure: ESOPHAGOGASTRODUODENOSCOPY (EGD) WITH PROPOFOL;  Surgeon: Virgel Manifold, MD;  Location: [place];  Service: Endoscopy;  Laterality: N/A;  . POLYPECTOMY N/A 01/12/2020   Procedure: POLYPECTOMY;  Surgeon: Virgel Manifold, MD;  Location: [place];  Service: Endoscopy;  Laterality: N/A;    Current Outpatient Medications  Medication Sig Dispense Refill  . amoxicillin (AMOXIL) 500 MG capsule Take 500 mg by mouth every 8 (eight) hours.    . chlorhexidine (PERIDEX) 0.12 % solution 2 (two) times daily.    . Cholecalciferol (VITAMIN D3) 1000 units CAPS Take 1 capsule by mouth 2 (two) times daily.     [name] ELIQUIS 5 MG TABS tablet Take 1 tablet by mouth twice daily 180 tablet 1  . furosemide (LASIX) 20 MG tablet Take 1 tablet (20 mg total) by mouth daily as needed for fluid or edema. 30 tablet 0  . glucose blood (ONETOUCH VERIO) test strip USE UP TO 4 TIMES DAILY AS DIRECTED 100 each 1  . Lancets (ONETOUCH DELICA PLUS [id number]) MISC UP TO 4 TIMES DAILY AS DIRECTED 100 each  1  . Lavender Oil OIL Apply 1 application topically daily as needed.     Koren Bound Oil OIL Take 1 application by mouth daily as needed.     Cindy Hazy Oil OIL Apply 1 application topically daily as needed.     . Magnesium Bisglycinate (MAG GLYCINATE) 100 MG TABS Take 400 mg by mouth daily.    . metFORMIN (GLUCOPHAGE-XR) 500 MG 24 hr tablet Take 1/2 tablet by mouth twice a day 90 tablet 3  . metoprolol tartrate (LOPRESSOR) 50 MG tablet Take 1 tablet (50 mg total) by mouth 2 (two) times daily. 180 tablet 3  . midodrine (PROAMATINE) 2.5 MG tablet Take 1 tablet (2.5 mg) by mouth three times a day at 6 am, 10 am, & 2 pm 270 tablet 2  . Multiple  Vitamins-Minerals (MULTIVITAMIN ADULT EXTRA C PO) Take 1 tablet by mouth daily.    . Oil of Oregano 1500 MG CAPS Take by mouth.    . peppermint oil liquid Apply 1 application topically daily as needed.     . Tea Tree Oil OIL Apply topically.    . vitamin C (ASCORBIC ACID) 500 MG tablet Take 500 mg by mouth daily.     [name] zinc gluconate 50 MG tablet Take 50 mg by mouth daily.     No current facility-administered medications for this visit.    No Known Allergies  Indication: Patient presents with symptomatic varicose veins of the left lower extremity.  Procedure: Foam sclerotherapy was performed on the left lower extremity. Using ultrasound guidance, 5 mL of foam Sotradecol was used to inject the varicosities of the left lower extremity. Compression wraps were placed. The patient tolerated the procedure well.

## 2020-10-15 NOTE — Telephone Encounter (Signed)
Spoke with Brandy [name] and advised of Brandy Standard PA's recommendations as below.  Brandy [name] states she finally feels heart rate is calming down as of today and she herself fells much calmer.  Brandy [name] is scheduled to see Dr Quentin Ore 10/20/2020 for consideration of ablation.  Brandy [name] verbalizes understanding of all instructions provided and will contact office if she feels she needs to be seen in AFIB clinic for further evaluation.  Per Brandy [name]: She can continue as she is with an additional 1/2 tab of her metoprolol.  If feeling poorly and persistent rates >110 at rest could take it 50mg  TID as long as her blood pressure tolerates.  She can also be seen in the AFib clinic if needed.   Renee

## 2020-10-17 ENCOUNTER — Telehealth: Payer: Self-pay | Admitting: Internal Medicine

## 2020-10-17 NOTE — Telephone Encounter (Signed)
Cardiology Moonl[facility] Note  Returned page from patient. HR has been in the 130's intermittently today. In AF. Having anxiety about her HR. No other symptoms. No CP, SOB, syncope, palpitations. Recommended patient take another 50mg  dose of metoprolol. SBP currently in the 120's. Advised her to monitor for symptoms. Do deep breathing exercises. Has appt on Wednesday of this week. She will call back if she develops symptoms or if HR does not improve.   Marcie Mowers, MD Cardiology Fellow, PGY-8

## 2020-10-19 ENCOUNTER — Telehealth: Payer: Self-pay | Admitting: Cardiology

## 2020-10-19 NOTE — Telephone Encounter (Signed)
  Patient Consent for Virtual Visit         [name] has provided verbal consent on 10/19/2020 for a virtual visit (video or telephone).   CONSENT FOR VIRTUAL VISIT FOR:  Brandy [name]  By participating in this virtual visit I agree to the following:  I hereby voluntarily request, consent and authorize [facility] and its employed or contracted physicians, physician assistants, nurse practitioners or other licensed health care professionals (the Practitioner), to provide me with telemedicine health care services (the "Services") as deemed necessary by the treating Practitioner. I acknowledge and consent to receive the Services by the Practitioner via telemedicine. I understand that the telemedicine visit will involve communicating with the Practitioner through live audiovisual communication technology and the disclosure of certain medical information by electronic transmission. I acknowledge that I have been given the opportunity to request an in-person assessment or other available alternative prior to the telemedicine visit and am voluntarily participating in the telemedicine visit.  I understand that I have the right to withhold or withdraw my consent to the use of telemedicine in the course of my care at any time, without affecting my right to future care or treatment, and that the Practitioner or I may terminate the telemedicine visit at any time. I understand that I have the right to inspect all information obtained and/or recorded in the course of the telemedicine visit and may receive copies of available information for a reasonable fee.  I understand that some of the potential risks of receiving the Services via telemedicine include:  [name] Delay or interruption in medical evaluation due to technological equipment failure or disruption; . Information transmitted may not be sufficient (e.g. poor resolution of images) to allow for appropriate medical decision making by the Practitioner; and/or   . In rare instances, security protocols could fail, causing a breach of personal health information.  Furthermore, I acknowledge that it is my responsibility to provide information about my medical history, conditions and care that is complete and accurate to the best of my ability. I acknowledge that Practitioner's advice, recommendations, and/or decision may be based on factors not within their control, such as incomplete or inaccurate data provided by me or distortions of diagnostic images or specimens that may result from electronic transmissions. I understand that the practice of medicine is not an exact science and that Practitioner makes no warranties or guarantees regarding treatment outcomes. I acknowledge that a copy of this consent can be made available to me via my patient portal ([place]), or I can request a printed copy by calling the office of [facility].    I understand that my insurance will be billed for this visit.   I have read or had this consent read to me. . I understand the contents of this consent, which adequately explains the benefits and risks of the Services being provided via telemedicine.  . I have been provided ample opportunity to ask questions regarding this consent and the Services and have had my questions answered to my satisfaction. . I give my informed consent for the services to be provided through the use of telemedicine in my medical care

## 2020-10-20 ENCOUNTER — Telehealth (INDEPENDENT_AMBULATORY_CARE_PROVIDER_SITE_OTHER): Payer: Medicare Other | Admitting: Cardiology

## 2020-10-20 ENCOUNTER — Other Ambulatory Visit
Admission: RE | Admit: 2020-10-20 | Discharge: 2020-10-20 | Disposition: A | Discharge status: Home / Self Care | Payer: Medicare Other | Attending: Cardiology | Admitting: Cardiology

## 2020-10-20 VITALS — BP 118/72 | HR 80

## 2020-10-20 DIAGNOSIS — I428 Other cardiomyopathies: Secondary | ICD-10-CM

## 2020-10-20 DIAGNOSIS — I4811 Longstanding persistent atrial fibrillation: Secondary | ICD-10-CM

## 2020-10-20 LAB — CBC WITH DIFFERENTIAL/PLATELET
Abs Immature Granulocytes: 0.01 10*3/uL (ref 0.00–0.07)
Basophils Absolute: 0.1 10*3/uL (ref 0.0–0.1)
Basophils Relative: 1 %
Eosinophils Absolute: 0.1 10*3/uL (ref 0.0–0.5)
Eosinophils Relative: 2 %
HCT: 42.3 % (ref 36.0–46.0)
Hemoglobin: 14.6 g/dL (ref 12.0–15.0)
Immature Granulocytes: 0 %
Lymphocytes Relative: 41 %
Lymphs Abs: 3.1 10*3/uL (ref 0.7–4.0)
MCH: 31 pg (ref 26.0–34.0)
MCHC: 34.5 g/dL (ref 30.0–36.0)
MCV: 89.8 fL (ref 80.0–100.0)
Monocytes Absolute: 1 10*3/uL (ref 0.1–1.0)
Monocytes Relative: 13 %
Neutro Abs: 3.3 10*3/uL (ref 1.7–7.7)
Neutrophils Relative %: 43 %
Platelets: 238 10*3/uL (ref 150–400)
RBC: 4.71 MIL/uL (ref 3.87–5.11)
RDW: 12.8 % (ref 11.5–15.5)
WBC: 7.6 10*3/uL (ref 4.0–10.5)
nRBC: 0 % (ref 0.0–0.2)

## 2020-10-20 LAB — BASIC METABOLIC PANEL
Anion gap: 8 (ref 5–15)
BUN: 15 mg/dL (ref 8–23)
CO2: 26 mmol/L (ref 22–32)
Calcium: 9.5 mg/dL (ref 8.9–10.3)
Chloride: 104 mmol/L (ref 98–111)
Creatinine, Ser: 0.77 mg/dL (ref 0.44–1.00)
GFR, Estimated: >60 mL/min (ref >60)
Glucose, Bld: 95 mg/dL (ref 70–99)
Potassium: 4.2 mmol/L (ref 3.5–5.1)
Sodium: 138 mmol/L (ref 135–145)

## 2020-10-20 NOTE — H&P (View-Only) (Signed)
Virtual Visit via Telephone Note   This visit type was conducted due to national recommendations for restrictions regarding the COVID-19 Pandemic (e.g. social distancing) in an effort to limit this patient's exposure and mitigate transmission in our community.  Due to her co-morbid illnesses, this patient is at least at moderate risk for complications without adequate follow up.  This format is felt to be most appropriate for this patient at this time.  The patient did not have access to video technology/had technical difficulties with video requiring transitioning to audio format only (telephone).  All issues noted in this document were discussed and addressed.  No physical exam could be performed with this format.  Please refer to the patient's chart for her  consent to telehealth for [facility]    Date:  10/20/2020   ID:  Brandy [name], DOB [birth year], MRN [id number] The patient was identified using 2 identifiers.  Patient Location: Home Provider Location: Home Office   PCP:  Glean Hess, MD   [facility] Providers Cardiologist:  Jolyn Nap, MD    Evaluation Performed:  Follow-Up Visit  Chief Complaint:  Atrial fibrillation  History of Present Illness:    Brandy [name] is a 65 y.o. female who presents for an evaluation of atrial fibrillation at the request of Dr Caryl Comes.  She has a history of nonischemic cardiomyopathy with a nadir ejection fraction of 15 to 20%.  She previously been evaluated with a left heart catheterization in 2017 which showed normal coronary arteries.  She also has a history of obstructive sleep apnea treated with CPAP.  Her history of atrial fibrillation dates back to November 2017.  She is maintained on Eliquis 5 mg twice daily for stroke prophylaxis.  Dr. Caryl Comes has had discussions with the patient in the past about her atrial fibrillation.  There have been discussions in the past about the possibility of restoring normal rhythm given her normal  left atrial dimensions on echo.  They have discussed ablation in the past and have referred to me to discuss this further.  Patient was last seen by Oda Kilts on July 26, 2020. She was seen by Dr. Caryl Comes on May 31, 2020 in a video visit.  At that appointment she reported having a polypectomy in December 2021 for loss of blood.  He had previously seen her in November 2021 in person.  At that visit he reported her atrial fibrillation is permanent.  Previously on dofetilide. Last cardioversion was in 2018 with 3 shocks that were not successful.  It was around the time that her ejection fraction was moderate to severely reduced at 30%. He is previously taken dofetilide but this medication was stopped sometime in early 2019 because of ineffectiveness.  Today she tells me she is overall doing okay.  She is quite active.  She tells me that sometimes she will notice that her heart rates are elevated greater than 130 bpm.  It is during these times that she has no symptoms of palpitations.  No syncope or presyncope.  She tells me that since her colonoscopy and polypectomy she has not had any more issues with bleeding.  She has been taking her Eliquis uninterrupted.  Past Medical History:  Diagnosis Date  . Arthritis   . Chicken pox   . Chronic systolic CHF (congestive heart failure) ([place])    a. 03/2016 Echo: Ef 15-20%, diff HK, ant AK;  b. 05/2016 Echo: Ef 30-35%, diff HK, mildly dil LA/RA.  [name]  Diabetes mellitus without complication ([place])   . GERD (gastroesophageal reflux disease)   . Heart murmur   . Hip discomfort    been going on for 40 years   . History of kidney stones   . Hypertension   . Moderate mitral regurgitation    a. 03/2016 Echo: mod MR in setting of LV dysfxn.  . Motion sickness    car - back seat  . NICM (nonischemic cardiomyopathy) ([place])    a. 03/2016 Echo: EF 15-20%, diff HK, ant AK, mod MR, mod dil LA, mildly dil RA;  b. 04/2016 Cath: nl cors;  c. 05/2016 Echo: EF 30-35%,  diff HK.  . Obesity   . Obstructive sleep apnea    compliant with CPAP  . Persistent atrial fibrillation ([place])    a. Dx 03/2016;  b. CHA2DS2VASc = 2-->Eliquis 5mg  BID;  b. 05/2016 Failed DCCV x 4.  . Sleep apnea   . Visit for monitoring Tikosyn therapy 07/24/2016   Past Surgical History:  Procedure Laterality Date  . BIOPSY N/A 01/12/2020   Procedure: BIOPSY;  Surgeon: Virgel Manifold, MD;  Location: [place];  Service: Endoscopy;  Laterality: N/A;  . CARDIAC CATHETERIZATION N/A 04/17/2016   Procedure: Left Heart Cath and Coronary Angiography;  Surgeon: Wellington Hampshire, MD;  Location: [facility];  Service: Cardiovascular;  Laterality: N/A;  . CHOLECYSTECTOMY    . COLON SURGERY  2021  . COLONOSCOPY N/A 01/20/2020   Procedure: COLONOSCOPY;  Surgeon: Lesly Rubenstein, MD;  [facility];  Service: Endoscopy;  Laterality: N/A;  . COLONOSCOPY WITH PROPOFOL N/A 01/12/2020   Procedure: COLONOSCOPY WITH PROPOFOL;  Surgeon: Virgel Manifold, MD;  Location: [place];  Service: Endoscopy;  Laterality: N/A;  Diabetic - oral meds sleep apnea  . ELECTROPHYSIOLOGIC STUDY N/A 05/26/2016   Procedure: Cardioversion;  Surgeon: Wellington Hampshire, MD;  Location: [facility];  Service: Cardiovascular;  Laterality: N/A;  . ESOPHAGOGASTRODUODENOSCOPY (EGD) WITH PROPOFOL N/A 01/12/2020   Procedure: ESOPHAGOGASTRODUODENOSCOPY (EGD) WITH PROPOFOL;  Surgeon: Virgel Manifold, MD;  Location: [place];  Service: Endoscopy;  Laterality: N/A;  . POLYPECTOMY N/A 01/12/2020   Procedure: POLYPECTOMY;  Surgeon: Virgel Manifold, MD;  Location: [place];  Service: Endoscopy;  Laterality: N/A;     Current Meds  Medication Sig  . Cholecalciferol (VITAMIN D3) 1000 units CAPS Take 1 capsule by mouth 2 (two) times daily.   [name] ELIQUIS 5 MG TABS tablet Take 1 tablet by mouth twice daily  . furosemide (LASIX) 20 MG tablet Take 1 tablet (20 mg total) by  mouth daily as needed for fluid or edema.  [name] glucose blood (ONETOUCH VERIO) test strip USE UP TO 4 TIMES DAILY AS DIRECTED  . Lancets (ONETOUCH DELICA PLUS [id number]) MISC UP TO 4 TIMES DAILY AS DIRECTED  . Lavender Oil OIL Apply 1 application topically daily as needed.   Koren Bound Oil OIL Take 1 application by mouth daily as needed.   Cindy Hazy Oil OIL Apply 1 application topically daily as needed.   . Magnesium Bisglycinate (MAG GLYCINATE) 100 MG TABS Take 600 mg by mouth daily.  . metFORMIN (GLUCOPHAGE-XR) 500 MG 24 hr tablet Take 1/2 tablet by mouth twice a day  . metoprolol tartrate (LOPRESSOR) 50 MG tablet Take 1 tablet (50 mg total) by mouth 2 (two) times daily.  . midodrine (PROAMATINE) 2.5 MG tablet Take 1 tablet (2.5 mg) by mouth three times a day at 6 am,  10 am, & 2 pm  . Multiple Vitamins-Minerals (MULTIVITAMIN ADULT EXTRA C PO) Take 1 tablet by mouth daily.  . Oil of Oregano 1500 MG CAPS Take by mouth.  . peppermint oil liquid Apply 1 application topically daily as needed.   . Tea Tree Oil OIL Apply topically.  . vitamin C (ASCORBIC ACID) 500 MG tablet Take 500 mg by mouth daily.      Allergies:   Patient has no known allergies.   Social History   Tobacco Use  . Smoking status: Never Smoker  . Smokeless tobacco: Never Used  Vaping Use  . Vaping Use: Never used  Substance Use Topics  . Alcohol use: Never  . Drug use: Never     Family Hx: The patient's family history includes Anxiety disorder in her mother; Arthritis in her father and mother; Asthma in her mother; COPD in her father and mother; Cancer in her maternal aunt; Depression in her mother; Diabetes in her paternal aunt; Heart disease in her father; Hypertension in her father; Varicose Veins in her father.  ROS:   Please see the history of present illness.     All other systems reviewed and are negative.   Prior CV studies:   The following studies were reviewed today:   09/21/2020 Echo personally  reviewed Left ventricular function normal, 55% Mild LVH Mildly reduced right ventricular function No MR Normal left and right atrial size  04/07/2020 ECG - AF 02/05/2020 ECG - AF 01/19/2020 ECG - AF w/ RVR 04/25/2019 ECG - AF 07/04/2017 ECG - sinus rhythm 04/03/2017 ECG - AF 12/11/2016 ECG - sinus 11/30/2016 ECG - sinus 10/02/2016 ECG - sinus  11/17/2016 Holter - AFL/AF    Labs/Other Tests and Data Reviewed:    Recent Labs: 01/18/2020: ALT 28 09/21/2020: BUN 15; Creatinine, Ser 0.88; Hemoglobin 16.1; Magnesium 2.3; Platelets 310; Potassium 4.6; Sodium 139   Recent Lipid Panel Lab Results  Component Value Date/Time   CHOL 191 10/31/2016 02:57 PM   TRIG 216.0 (H) 10/31/2016 02:57 PM   HDL 41.30 10/31/2016 02:57 PM   CHOLHDL 5 10/31/2016 02:57 PM   LDLDIRECT 131.0 10/31/2016 02:57 PM    Wt Readings from Last 3 Encounters:  10/15/20 205 lb (93 kg)  09/22/20 208 lb (94.3 kg)  08/02/20 198 lb (89.8 kg)      Objective:    Vital Signs:  BP 118/72   Pulse 80    VITAL SIGNS:  reviewed NEURO:  alert and oriented x 3, no obvious focal deficit PSYCH:  normal affect  ASSESSMENT & PLAN:    1. Atrial fibrillation Unclear to me at this point whether or not she has longstanding persistent atrial fibrillation or permanent atrial fibrillation.  Her last attempt at restoring normal rhythm was in January 2018.  This was around the time that her left ventricular function was reduced.  Her recent echocardiogram shows surprisingly normal left atrial size.  For this reason, I think it is reasonable to consider restoration of sinus rhythm.  To help guide her strategy, I would like to trial another cardioversion.  I will plan to see her back in clinic about 1 week after the cardioversion.  If she is able to have any sinus rhythm after the cardioversion, we will plan to proceed with atrial fibrillation ablation.  If the cardioversion is not acutely successful or if she has very early return atrial  fibrillation, I would deem her atrial fibrillation to be permanent and focus on rate control and stroke  prophylaxis.  We discussed stroke prophylaxis strategies for her atrial fibrillation during today's visit.  We discussed using anticoagulation and left atrial appendage occlusion.  She has experienced 1 episode of GI bleeding that occurred shortly after a polyp was removed during colonoscopy.  She thinks this was related to starting her blood thinner too soon after the colonoscopy.  Since it was restarted, she has not had any more trouble with GI bleeding.  Recent blood work shows a normal hemoglobin level.  I have recommended to Ms. Bert that we continue with oral anticoagulation for stroke prophylaxis.  If she develops recurrent issues with GI bleeding in the future, we can revisit left atrial appendage occlusion.  2.  Nonischemic cardiomyopathy Ejection fraction is normalized.  NYHA class II.  Warm and dry by history. Continue metoprolol and as needed Lasix   Follow-up 1 week after cardioversion.   Time:   Today, I have spent 25 minutes with the patient with telehealth technology discussing the above problems.     Medication Adjustments/Labs and Tests Ordered: Current medicines are reviewed at length with the patient today.  Concerns regarding medicines are outlined above.   Tests Ordered: Orders Placed This Encounter  Procedures  . Basic Metabolic Panel (BMET)  . CBC w/Diff    Medication Changes: No orders of the defined types were placed in this encounter.  Signed, Vickie Epley, MD  10/20/2020 12:08 PM    [facility]

## 2020-10-20 NOTE — Progress Notes (Signed)
Virtual Visit via Telephone Note   This visit type was conducted due to national recommendations for restrictions regarding the COVID-19 Pandemic (e.g. social distancing) in an effort to limit this patient's exposure and mitigate transmission in our community.  Due to her co-morbid illnesses, this patient is at least at moderate risk for complications without adequate follow up.  This format is felt to be most appropriate for this patient at this time.  The patient did not have access to video technology/had technical difficulties with video requiring transitioning to audio format only (telephone).  All issues noted in this document were discussed and addressed.  No physical exam could be performed with this format.  Please refer to the patient's chart for her  consent to telehealth for [facility].    Date:  10/20/2020   ID:  Brandy [name], DOB 05-10-1956, MRN [id number] The patient was identified using 2 identifiers.  Patient Location: Home Provider Location: Home Office   PCP:  Glean Hess, MD   [facility] Providers Cardiologist:  Jolyn Nap, MD    Evaluation Performed:  Follow-Up Visit  Chief Complaint:  Atrial fibrillation  History of Present Illness:    Brandy [name] is a 65 y.o. female who presents for an evaluation of atrial fibrillation at the request of Dr Caryl Comes.  She has a history of nonischemic cardiomyopathy with a nadir ejection fraction of 15 to 20%.  She previously been evaluated with a left heart catheterization in 2017 which showed normal coronary arteries.  She also has a history of obstructive sleep apnea treated with CPAP.  Her history of atrial fibrillation dates back to November 2017.  She is maintained on Eliquis 5 mg twice daily for stroke prophylaxis.  Dr. Caryl Comes has had discussions with the patient in the past about her atrial fibrillation.  There have been discussions in the past about the possibility of restoring normal rhythm given her normal  left atrial dimensions on echo.  They have discussed ablation in the past and have referred to me to discuss this further.  Patient was last seen by Oda Kilts on July 26, 2020. She was seen by Dr. Caryl Comes on May 31, 2020 in a video visit.  At that appointment she reported having a polypectomy in December 2021 for loss of blood.  He had previously seen her in November 2021 in person.  At that visit he reported her atrial fibrillation is permanent.  Previously on dofetilide. Last cardioversion was in 2018 with 3 shocks that were not successful.  It was around the time that her ejection fraction was moderate to severely reduced at 30%. He is previously taken dofetilide but this medication was stopped sometime in early 2019 because of ineffectiveness.  Today she tells me she is overall doing okay.  She is quite active.  She tells me that sometimes she will notice that her heart rates are elevated greater than 130 bpm.  It is during these times that she has no symptoms of palpitations.  No syncope or presyncope.  She tells me that since her colonoscopy and polypectomy she has not had any more issues with bleeding.  She has been taking her Eliquis uninterrupted.  Past Medical History:  Diagnosis Date  . Arthritis   . Chicken pox   . Chronic systolic CHF (congestive heart failure) ([place])    a. 03/2016 Echo: Ef 15-20%, diff HK, ant AK;  b. 05/2016 Echo: Ef 30-35%, diff HK, mildly dil LA/RA.  [name]  Diabetes mellitus without complication ([place])   . GERD (gastroesophageal reflux disease)   . Heart murmur   . Hip discomfort    been going on for 40 years   . History of kidney stones   . Hypertension   . Moderate mitral regurgitation    a. 03/2016 Echo: mod MR in setting of LV dysfxn.  . Motion sickness    car - back seat  . NICM (nonischemic cardiomyopathy) ([place])    a. 03/2016 Echo: EF 15-20%, diff HK, ant AK, mod MR, mod dil LA, mildly dil RA;  b. 04/2016 Cath: nl cors;  c. 05/2016 Echo: EF 30-35%,  diff HK.  . Obesity   . Obstructive sleep apnea    compliant with CPAP  . Persistent atrial fibrillation ([place])    a. Dx 03/2016;  b. CHA2DS2VASc = 2-->Eliquis 5mg  BID;  b. 05/2016 Failed DCCV x 4.  . Sleep apnea   . Visit for monitoring Tikosyn therapy 07/24/2016   Past Surgical History:  Procedure Laterality Date  . BIOPSY N/A 01/12/2020   Procedure: BIOPSY;  Surgeon: Virgel Manifold, MD;  Location: [place];  Service: Endoscopy;  Laterality: N/A;  . CARDIAC CATHETERIZATION N/A 04/17/2016   Procedure: Left Heart Cath and Coronary Angiography;  Surgeon: Wellington Hampshire, MD;  Location: [place] CV LAB;  Service: Cardiovascular;  Laterality: N/A;  . CHOLECYSTECTOMY    . COLON SURGERY  2021  . COLONOSCOPY N/A 01/20/2020   Procedure: COLONOSCOPY;  Surgeon: Lesly Rubenstein, MD;  Location: [facility] ENDOSCOPY;  Service: Endoscopy;  Laterality: N/A;  . COLONOSCOPY WITH PROPOFOL N/A 01/12/2020   Procedure: COLONOSCOPY WITH PROPOFOL;  Surgeon: Virgel Manifold, MD;  Location: [place];  Service: Endoscopy;  Laterality: N/A;  Diabetic - oral meds sleep apnea  . ELECTROPHYSIOLOGIC STUDY N/A 05/26/2016   Procedure: Cardioversion;  Surgeon: Wellington Hampshire, MD;  Location: [facility] ORS;  Service: Cardiovascular;  Laterality: N/A;  . ESOPHAGOGASTRODUODENOSCOPY (EGD) WITH PROPOFOL N/A 01/12/2020   Procedure: ESOPHAGOGASTRODUODENOSCOPY (EGD) WITH PROPOFOL;  Surgeon: Virgel Manifold, MD;  Location: [place];  Service: Endoscopy;  Laterality: N/A;  . POLYPECTOMY N/A 01/12/2020   Procedure: POLYPECTOMY;  Surgeon: Virgel Manifold, MD;  Location: [place];  Service: Endoscopy;  Laterality: N/A;     Current Meds  Medication Sig  . Cholecalciferol (VITAMIN D3) 1000 units CAPS Take 1 capsule by mouth 2 (two) times daily.   [name] ELIQUIS 5 MG TABS tablet Take 1 tablet by mouth twice daily  . furosemide (LASIX) 20 MG tablet Take 1 tablet (20 mg total) by  mouth daily as needed for fluid or edema.  [name] glucose blood (ONETOUCH VERIO) test strip USE UP TO 4 TIMES DAILY AS DIRECTED  . Lancets (ONETOUCH DELICA PLUS [id number]) MISC UP TO 4 TIMES DAILY AS DIRECTED  . Lavender Oil OIL Apply 1 application topically daily as needed.   Koren Bound Oil OIL Take 1 application by mouth daily as needed.   Cindy Hazy Oil OIL Apply 1 application topically daily as needed.   . Magnesium Bisglycinate (MAG GLYCINATE) 100 MG TABS Take 600 mg by mouth daily.  . metFORMIN (GLUCOPHAGE-XR) 500 MG 24 hr tablet Take 1/2 tablet by mouth twice a day  . metoprolol tartrate (LOPRESSOR) 50 MG tablet Take 1 tablet (50 mg total) by mouth 2 (two) times daily.  . midodrine (PROAMATINE) 2.5 MG tablet Take 1 tablet (2.5 mg) by mouth three times a day at 6 am,  10 am, & 2 pm  . Multiple Vitamins-Minerals (MULTIVITAMIN ADULT EXTRA C PO) Take 1 tablet by mouth daily.  . Oil of Oregano 1500 MG CAPS Take by mouth.  . peppermint oil liquid Apply 1 application topically daily as needed.   . Tea Tree Oil OIL Apply topically.  . vitamin C (ASCORBIC ACID) 500 MG tablet Take 500 mg by mouth daily.      Allergies:   Patient has no known allergies.   Social History   Tobacco Use  . Smoking status: Never Smoker  . Smokeless tobacco: Never Used  Vaping Use  . Vaping Use: Never used  Substance Use Topics  . Alcohol use: Never  . Drug use: Never     Family Hx: The patient's family history includes Anxiety disorder in her mother; Arthritis in her father and mother; Asthma in her mother; COPD in her father and mother; Cancer in her maternal aunt; Depression in her mother; Diabetes in her paternal aunt; Heart disease in her father; Hypertension in her father; Varicose Veins in her father.  ROS:   Please see the history of present illness.     All other systems reviewed and are negative.   Prior CV studies:   The following studies were reviewed today:   09/21/2020 Echo personally  reviewed Left ventricular function normal, 55% Mild LVH Mildly reduced right ventricular function No MR Normal left and right atrial size  04/07/2020 ECG - AF 02/05/2020 ECG - AF 01/19/2020 ECG - AF w/ RVR 04/25/2019 ECG - AF 07/04/2017 ECG - sinus rhythm 04/03/2017 ECG - AF 12/11/2016 ECG - sinus 11/30/2016 ECG - sinus 10/02/2016 ECG - sinus  11/17/2016 Holter - AFL/AF    Labs/Other Tests and Data Reviewed:    Recent Labs: 01/18/2020: ALT 28 09/21/2020: BUN 15; Creatinine, Ser 0.88; Hemoglobin 16.1; Magnesium 2.3; Platelets 310; Potassium 4.6; Sodium 139   Recent Lipid Panel Lab Results  Component Value Date/Time   CHOL 191 10/31/2016 02:57 PM   TRIG 216.0 (H) 10/31/2016 02:57 PM   HDL 41.30 10/31/2016 02:57 PM   CHOLHDL 5 10/31/2016 02:57 PM   LDLDIRECT 131.0 10/31/2016 02:57 PM    Wt Readings from Last 3 Encounters:  10/15/20 205 lb (93 kg)  09/22/20 208 lb (94.3 kg)  08/02/20 198 lb (89.8 kg)      Objective:    Vital Signs:  BP 118/72   Pulse 80    VITAL SIGNS:  reviewed NEURO:  alert and oriented x 3, no obvious focal deficit PSYCH:  normal affect  ASSESSMENT & PLAN:    1. Atrial fibrillation Unclear to me at this point whether or not she has longstanding persistent atrial fibrillation or permanent atrial fibrillation.  Her last attempt at restoring normal rhythm was in January 2018.  This was around the time that her left ventricular function was reduced.  Her recent echocardiogram shows surprisingly normal left atrial size.  For this reason, I think it is reasonable to consider restoration of sinus rhythm.  To help guide her strategy, I would like to trial another cardioversion.  I will plan to see her back in clinic about 1 week after the cardioversion.  If she is able to have any sinus rhythm after the cardioversion, we will plan to proceed with atrial fibrillation ablation.  If the cardioversion is not acutely successful or if she has very early return atrial  fibrillation, I would deem her atrial fibrillation to be permanent and focus on rate control and stroke  prophylaxis.  We discussed stroke prophylaxis strategies for her atrial fibrillation during today's visit.  We discussed using anticoagulation and left atrial appendage occlusion.  She has experienced 1 episode of GI bleeding that occurred shortly after a polyp was removed during colonoscopy.  She thinks this was related to starting her blood thinner too soon after the colonoscopy.  Since it was restarted, she has not had any more trouble with GI bleeding.  Recent blood work shows a normal hemoglobin level.  I have recommended to Ms. Andrades that we continue with oral anticoagulation for stroke prophylaxis.  If she develops recurrent issues with GI bleeding in the future, we can revisit left atrial appendage occlusion.  2.  Nonischemic cardiomyopathy Ejection fraction is normalized.  NYHA class II.  Warm and dry by history. Continue metoprolol and as needed Lasix   Follow-up 1 week after cardioversion.   Time:   Today, I have spent 25 minutes with the patient with telehealth technology discussing the above problems.     Medication Adjustments/Labs and Tests Ordered: Current medicines are reviewed at length with the patient today.  Concerns regarding medicines are outlined above.   Tests Ordered: Orders Placed This Encounter  Procedures  . Basic Metabolic Panel (BMET)  . CBC w/Diff    Medication Changes: No orders of the defined types were placed in this encounter.  Signed, Vickie Epley, MD  10/20/2020 12:08 PM    [facility]

## 2020-10-20 NOTE — Patient Instructions (Addendum)
Medication Instructions:  Your physician recommends that you continue on your current medications as directed. Please refer to the Current Medication list given to you today. *If you need a refill on your cardiac medications before your next appointment, please call your pharmacy*  Lab Work: You will get lab work at the [facility] June 8 or 9, 2022  If you have labs (blood work) drawn today and your tests are completely normal, you will receive your results only by: [name] MyChart Message (if you have MyChart) OR . A paper copy in the mail If you have any lab test that is abnormal or we need to change your treatment, we will call you to review the results.  Testing/Procedures: Your physician has recommended that you have a Cardioversion (DCCV). Electrical Cardioversion uses a jolt of electricity to your heart either through paddles or wired patches attached to your chest. This is a controlled, usually prescheduled, procedure. Defibrillation is done under light anesthesia in the hospital, and you usually go home the day of the procedure. This is done to get your heart back into a normal rhythm. You are not awake for the procedure. Please see the instruction sheet given to you today.  Follow-Up: At [facility], you and your health needs are our priority.  As part of our continuing mission to provide you with exceptional heart care, we have created designated Provider Care Teams.  These Care Teams include your primary Cardiologist (physician) and Advanced Practice Providers (APPs -  Physician Assistants and Nurse Practitioners) who all work together to provide you with the care you need, when you need it.  Your next appointment:   Your physician wants you to follow-up in: one week with Dr. Quentin Ore in [place].  You are scheduled for a Cardioversion on October 22, 2020 with Dr.End. Please arrive at the [place] [name] at 6:30 a.m. on the day of your procedure.  DIET INSTRUCTIONS:   Nothing to eat or drink after midnight except your medications with a              sip of water.         Labs: June 8 or 9  1) Medications:  YOU MAY TAKE ALL of your remaining medications with a small amount of water EXCEPT do not take your metformin until you are able to eat.  2) Must have a responsible person to drive you home.  3) Bring a current list of your medications and current insurance cards.    If you have any questions after you get home, please call the office at [phone]

## 2020-10-22 ENCOUNTER — Encounter
Admission: RE | Disposition: A | Discharge status: Home / Self Care | Payer: Self-pay | Source: Home / Self Care | Attending: Internal Medicine

## 2020-10-22 ENCOUNTER — Ambulatory Visit
Admission: RE | Admit: 2020-10-22 | Discharge: 2020-10-22 | Disposition: A | Discharge status: Home / Self Care | Payer: Medicare Other | Attending: Internal Medicine | Admitting: Internal Medicine

## 2020-10-22 ENCOUNTER — Encounter: Payer: Self-pay | Admitting: Internal Medicine

## 2020-10-22 ENCOUNTER — Ambulatory Visit: Payer: Medicare Other | Admitting: Anesthesiology

## 2020-10-22 DIAGNOSIS — E669 Obesity, unspecified: Secondary | ICD-10-CM | POA: Diagnosis not present

## 2020-10-22 DIAGNOSIS — Z809 Family history of malignant neoplasm, unspecified: Secondary | ICD-10-CM | POA: Diagnosis not present

## 2020-10-22 DIAGNOSIS — I5022 Chronic systolic (congestive) heart failure: Secondary | ICD-10-CM | POA: Diagnosis not present

## 2020-10-22 DIAGNOSIS — Z833 Family history of diabetes mellitus: Secondary | ICD-10-CM | POA: Diagnosis not present

## 2020-10-22 DIAGNOSIS — Z825 Family history of asthma and other chronic lower respiratory diseases: Secondary | ICD-10-CM | POA: Diagnosis not present

## 2020-10-22 DIAGNOSIS — I4811 Longstanding persistent atrial fibrillation: Secondary | ICD-10-CM

## 2020-10-22 DIAGNOSIS — I4891 Unspecified atrial fibrillation: Secondary | ICD-10-CM | POA: Diagnosis not present

## 2020-10-22 DIAGNOSIS — Z7984 Long term (current) use of oral hypoglycemic drugs: Secondary | ICD-10-CM | POA: Insufficient documentation

## 2020-10-22 DIAGNOSIS — I428 Other cardiomyopathies: Secondary | ICD-10-CM | POA: Diagnosis not present

## 2020-10-22 DIAGNOSIS — Z7901 Long term (current) use of anticoagulants: Secondary | ICD-10-CM | POA: Insufficient documentation

## 2020-10-22 DIAGNOSIS — K219 Gastro-esophageal reflux disease without esophagitis: Secondary | ICD-10-CM | POA: Insufficient documentation

## 2020-10-22 DIAGNOSIS — Z79899 Other long term (current) drug therapy: Secondary | ICD-10-CM | POA: Insufficient documentation

## 2020-10-22 DIAGNOSIS — I4819 Other persistent atrial fibrillation: Secondary | ICD-10-CM | POA: Diagnosis not present

## 2020-10-22 DIAGNOSIS — Z8249 Family history of ischemic heart disease and other diseases of the circulatory system: Secondary | ICD-10-CM | POA: Diagnosis not present

## 2020-10-22 DIAGNOSIS — I110 Hypertensive heart disease with heart failure: Secondary | ICD-10-CM | POA: Insufficient documentation

## 2020-10-22 DIAGNOSIS — Z6832 Body mass index (BMI) 32.0-32.9, adult: Secondary | ICD-10-CM | POA: Diagnosis not present

## 2020-10-22 DIAGNOSIS — E119 Type 2 diabetes mellitus without complications: Secondary | ICD-10-CM | POA: Insufficient documentation

## 2020-10-22 DIAGNOSIS — E118 Type 2 diabetes mellitus with unspecified complications: Secondary | ICD-10-CM

## 2020-10-22 DIAGNOSIS — Z8261 Family history of arthritis: Secondary | ICD-10-CM | POA: Diagnosis not present

## 2020-10-22 HISTORY — PX: CARDIOVERSION: SHX1299

## 2020-10-22 LAB — GLUCOSE, CAPILLARY: Glucose-Capillary: 101 mg/dL — ABNORMAL HIGH (ref 70–99)

## 2020-10-22 SURGERY — CARDIOVERSION
Anesthesia: General

## 2020-10-22 MED ORDER — ONDANSETRON HCL 4 MG/2ML IJ SOLN: 4.0000 mg | Freq: Once | INTRAMUSCULAR | Status: DC | PRN

## 2020-10-22 MED ORDER — PROPOFOL 10 MG/ML IV BOLUS
INTRAVENOUS | Status: AC
  Filled 2020-10-22: qty 20

## 2020-10-22 MED ORDER — PROPOFOL 10 MG/ML IV BOLUS
INTRAVENOUS | Status: DC | PRN
  Administered 2020-10-22: 70 mg via INTRAVENOUS

## 2020-10-22 MED ORDER — SODIUM CHLORIDE 0.9 % IV SOLN
INTRAVENOUS | Status: DC
  Administered 2020-10-22: 1000 mL via INTRAVENOUS

## 2020-10-22 NOTE — Interval H&P Note (Signed)
History and Physical Interval Note:  10/22/2020 7:26 AM  Brandy [name]  has presented today for surgery, with the diagnosis of atrial fibrillation.  The various methods of treatment have been discussed with the patient and family. After consideration of risks, benefits and other options for treatment, the patient has consented to  Procedure(s): CARDIOVERSION (N/A) as a surgical intervention.  The patient's history has been reviewed, patient examined, no change in status, stable for surgery.  I have reviewed the patient's chart and labs.  Questions were answered to the patient's satisfaction.     Lynnel Zanetti

## 2020-10-22 NOTE — Anesthesia Procedure Notes (Signed)
Procedure Name: MAC Date/Time: 10/22/2020 7:28 AM Performed by: Jerrye Noble, CRNA Pre-anesthesia Checklist: Patient identified, Emergency Drugs available, Suction available and Patient being monitored Patient Re-evaluated:Patient Re-evaluated prior to induction Oxygen Delivery Method: Nasal cannula

## 2020-10-22 NOTE — Anesthesia Preprocedure Evaluation (Addendum)
Anesthesia Evaluation  Patient identified by MRN, date of birth, ID band Patient awake    Reviewed: Allergy & Precautions, NPO status , Patient's Chart, lab work & pertinent test results  History of Anesthesia Complications Negative for: history of anesthetic complications  Airway Mallampati: II  TM Distance: >3 FB Neck ROM: Full    Dental no notable dental hx. (+) Teeth Intact   Pulmonary sleep apnea , neg COPD, Patient abstained from smoking.Not current smoker,    Pulmonary exam normal breath sounds clear to auscultation       Cardiovascular Exercise Tolerance: Good METShypertension, (-) CAD, (-) Past MI and (-) CHF + dysrhythmias Atrial Fibrillation  Rhythm:Regular Rate:Normal - Systolic murmurs 1. Left ventricular ejection fraction, by estimation, is 55 to 60%. Left  ventricular ejection fraction by 3D volume is 57 %. The left ventricle has  normal function. The left ventricle has no regional wall motion  abnormalities. There is mild left  ventricular hypertrophy. Left ventricular diastolic parameters are  indeterminate.  2. Right ventricular systolic function is mildly reduced. The right  ventricular size is normal. There is normal pulmonary artery systolic  pressure. The estimated right ventricular systolic pressure is 62.8 mmHg.  3. The mitral valve is normal in structure. No evidence of mitral valve  regurgitation.  4. The aortic valve is tricuspid. Aortic valve regurgitation is not  visualized. No aortic stenosis is present.  5. The inferior vena cava is dilated in size with >50% respiratory  variability, suggesting right atrial pressure of 8 mmHg.   Comparison(s): 12/31/18 EF 60-65%.    Neuro/Psych negative neurological ROS  negative psych ROS   GI/Hepatic GERD  ,(+)     (-) substance abuse  ,   Endo/Other  diabetes  Renal/GU negative Renal ROS     Musculoskeletal   Abdominal   Peds   Hematology   Anesthesia Other Findings Past Medical History: No date: Arthritis No date: Chicken pox No date: Chronic systolic CHF (congestive heart failure) ([place])     Comment:  a. 03/2016 Echo: Ef 15-20%, diff HK, ant AK;  b. 05/2016               Echo: Ef 30-35%, diff HK, mildly dil LA/RA. No date: Diabetes mellitus without complication (H[facility]) No date: Dysrhythmia No date: GERD (gastroesophageal reflux disease) No date: Heart murmur No date: Hip discomfort     Comment:  been going on for 40 years  No date: History of kidney stones No date: Hypertension No date: Moderate mitral regurgitation     Comment:  a. 03/2016 Echo: mod MR in setting of LV dysfxn. No date: Motion sickness     Comment:  car - back seat No date: NICM (nonischemic cardiomyopathy) ([place])     Comment:  a. 03/2016 Echo: EF 15-20%, diff HK, ant AK, mod MR, mod              dil LA, mildly dil RA;  b. 04/2016 Cath: nl cors;  c.               05/2016 Echo: EF 30-35%, diff HK. No date: Obesity No date: Obstructive sleep apnea     Comment:  compliant with CPAP No date: Persistent atrial fibrillation ([place])     Comment:  a. Dx 03/2016;  b. CHA2DS2VASc = 2-->Eliquis 5mg  BID;                b. 05/2016 Failed DCCV x 4. No date: Sleep apnea 07/24/2016: Visit  for monitoring Tikosyn therapy  Reproductive/Obstetrics                            Anesthesia Physical Anesthesia Plan  ASA: 3  Anesthesia Plan: General   Post-op Pain Management:    Induction: Intravenous  PONV Risk Score and Plan: 3 and Ondansetron, Propofol infusion and TIVA  Airway Management Planned: Nasal Cannula  Additional Equipment: None  Intra-op Plan:   Post-operative Plan:   Informed Consent: I have reviewed the patients History and Physical, chart, labs and discussed the procedure including the risks, benefits and alternatives for the proposed anesthesia with the patient or authorized representative who has indicated  his/her understanding and acceptance.     Dental advisory given  Plan Discussed with: CRNA and Surgeon  Anesthesia Plan Comments: (Discussed risks of anesthesia with patient, including possibility of difficulty with spontaneous ventilation under anesthesia necessitating airway intervention, PONV, and rare risks such as cardiac or respiratory or neurological events. Patient understands.)        Anesthesia Quick Evaluation

## 2020-10-22 NOTE — CV Procedure (Signed)
    Cardioversion Note  Brandy [name] [id number] [birth year]  Procedure: DC Cardioversion Indications: Persistent atrial fibrillation  Procedure Details Consent: Obtained Time Out: Verified patient identification, verified procedure, site/side was marked, verified correct patient position, special equipment/implants available, Radiology Safety Procedures followed,  medications/allergies/relevent history reviewed, required imaging and test results available.  Performed  The patient has been on adequate anticoagulation.  The patient received IV propofol by anesthesia services for sedation.  Synchronous cardioversion was performed at 200 joules x 1.  The cardioversion was successful with restoration of sinus rhythm.  Complications: No apparent complications Patient did tolerate procedure well.  Nelva Bush., MD 10/22/2020, 7:32 AM

## 2020-10-22 NOTE — Anesthesia Postprocedure Evaluation (Signed)
Anesthesia Post Note  Patient: Brandy [name]  Procedure(s) Performed: CARDIOVERSION  Patient location during evaluation: Specials Recovery Anesthesia Type: General Level of consciousness: awake and alert Pain management: pain level controlled Vital Signs Assessment: post-procedure vital signs reviewed and stable Respiratory status: spontaneous breathing, nonlabored ventilation, respiratory function stable and patient connected to nasal cannula oxygen Cardiovascular status: blood pressure returned to baseline and stable Postop Assessment: no apparent nausea or vomiting Anesthetic complications: no   No notable events documented.   Last Vitals:  Vitals:   10/22/20 0735 10/22/20 0745  BP: (!) 104/57 105/65  Pulse: (!) 53 (!) 56  Resp: (!) 22 19  Temp:    SpO2: 96% 96%    Last Pain:  Vitals:   10/22/20 0745  TempSrc:   PainSc: 0-No pain                 [name]

## 2020-10-22 NOTE — Transfer of Care (Signed)
Immediate Anesthesia Transfer of Care Note  Patient: Brandy [name]  Procedure(s) Performed: CARDIOVERSION  Patient Location: PACU and Endoscopy Unit  Anesthesia Type:General  Level of Consciousness: drowsy and patient cooperative  Airway & Oxygen Therapy: Patient Spontanous Breathing and Patient connected to nasal cannula oxygen  Post-op Assessment: Report given to RN and Post -op Vital signs reviewed and stable  Post vital signs: Reviewed and stable  Last Vitals:  Vitals Value Taken Time  BP 111/68 10/22/20 0730  Temp    Pulse 52 10/22/20 0733  Resp 22 10/22/20 0733  SpO2 96 % 10/22/20 0733  Vitals shown include unvalidated device data.  Last Pain:  Vitals:   10/22/20 0653  TempSrc: Oral  PainSc: 0-No pain         Complications: No notable events documented.

## 2020-10-23 ENCOUNTER — Other Ambulatory Visit: Payer: Self-pay

## 2020-10-23 ENCOUNTER — Encounter: Payer: Self-pay | Admitting: Internal Medicine

## 2020-10-26 ENCOUNTER — Encounter (INDEPENDENT_AMBULATORY_CARE_PROVIDER_SITE_OTHER): Payer: Self-pay | Admitting: Vascular Surgery

## 2020-10-26 ENCOUNTER — Ambulatory Visit (INDEPENDENT_AMBULATORY_CARE_PROVIDER_SITE_OTHER): Payer: Medicare Other | Admitting: Vascular Surgery

## 2020-10-26 ENCOUNTER — Other Ambulatory Visit: Payer: Self-pay

## 2020-10-26 VITALS — BP 107/60 | HR 54 | Resp 16 | Wt 207.8 lb

## 2020-10-26 DIAGNOSIS — I83813 Varicose veins of bilateral lower extremities with pain: Secondary | ICD-10-CM

## 2020-10-26 NOTE — Progress Notes (Signed)
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 03/2016 Echo: Ef 15-20%, diff HK, ant AK;  b. 05/2016 Echo: Ef 30-35%, diff HK, mildly dil LA/RA.   Diabetes mellitus without complication (HCC)    Dysrhythmia    GERD (gastroesophageal reflux disease)    Heart murmur    Hip discomfort    been going on for 40 years    History of kidney stones    Hypertension    Moderate mitral regurgitation    a. 03/2016 Echo: mod MR in setting of LV dysfxn.   Motion sickness    car - back seat   NICM (nonischemic cardiomyopathy) ([place])    a. 03/2016 Echo: EF 15-20%, diff HK, ant AK, mod MR, mod dil LA, mildly dil RA;  b. 04/2016 Cath: nl cors;  c. 05/2016 Echo: EF 30-35%, diff HK.   Obesity    Obstructive sleep apnea    compliant with CPAP   Persistent atrial fibrillation ([place])    a. Dx 03/2016;  b. CHA2DS2VASc = 2-->Eliquis 5mg  BID;  b. 05/2016 Failed DCCV x 4.   Sleep apnea    Visit for monitoring Tikosyn therapy 07/24/2016    Past Surgical History:  Procedure Laterality Date   BIOPSY N/A 01/12/2020   Procedure: BIOPSY;  Surgeon: Virgel Manifold, MD;  Location: [place];  Service: Endoscopy;  Laterality: N/A;   CARDIAC CATHETERIZATION N/A 04/17/2016   Procedure: Left Heart Cath and Coronary Angiography;  Surgeon: Wellington Hampshire, MD;  Location: [place] [facility];  Service: Cardiovascular;  Laterality: N/A;   CARDIOVERSION N/A 10/22/2020   Procedure: CARDIOVERSION;  Surgeon: Nelva Bush, MD;  Location: [facility];  Service: Cardiovascular;  Laterality: N/A;   CHOLECYSTECTOMY     COLON SURGERY  2021   COLONOSCOPY N/A 01/20/2020   Procedure: COLONOSCOPY;  Surgeon: Lesly Rubenstein, MD;  Location: [facility];  Service: Endoscopy;  Laterality: N/A;   COLONOSCOPY WITH PROPOFOL N/A 01/12/2020   Procedure:  COLONOSCOPY WITH PROPOFOL;  Surgeon: Virgel Manifold, MD;  Location: [place];  Service: Endoscopy;  Laterality: N/A;  Diabetic - oral meds sleep apnea   ELECTROPHYSIOLOGIC STUDY N/A 05/26/2016   Procedure: Cardioversion;  Surgeon: Wellington Hampshire, MD;  Location: [facility];  Service: Cardiovascular;  Laterality: N/A;   ESOPHAGOGASTRODUODENOSCOPY (EGD) WITH PROPOFOL N/A 01/12/2020   Procedure: ESOPHAGOGASTRODUODENOSCOPY (EGD) WITH PROPOFOL;  Surgeon: Virgel Manifold, MD;  Location: [place];  Service: Endoscopy;  Laterality: N/A;   POLYPECTOMY N/A 01/12/2020   Procedure: POLYPECTOMY;  Surgeon: Virgel Manifold, MD;  Location: [place];  Service: Endoscopy;  Laterality: N/A;    Current Outpatient Medications  Medication Sig Dispense Refill   Cholecalciferol (VITAMIN D3) 1000 units CAPS Take 1,000 Units by mouth 2 (two) times daily.     ELIQUIS 5 MG TABS tablet Take 1 tablet by mouth twice daily 180 tablet 1   furosemide (LASIX) 20 MG tablet Take 1 tablet (20 mg total) by mouth daily as needed for fluid or edema. 30 tablet 0   glucose blood (ONETOUCH VERIO) test strip USE UP TO 4 TIMES DAILY AS DIRECTED 100 each 1   Lancets (ONETOUCH DELICA PLUS [id number]) MISC UP TO 4 TIMES DAILY AS DIRECTED 100 each 1   Lavender  Oil OIL Apply 1 application topically daily as needed (stress).     Lemon Oil OIL Take 1 application by mouth daily as needed (add to water).     Lemongrass Oil OIL Apply 1 application topically daily as needed (multiple uses).     Magnesium Bisglycinate (MAG GLYCINATE) 100 MG TABS Take 200 mg by mouth in the morning, at noon, and at bedtime.     metFORMIN (GLUCOPHAGE-XR) 500 MG 24 hr tablet Take 1/2 tablet by mouth twice a day 90 tablet 3   metoprolol tartrate (LOPRESSOR) 50 MG tablet Take 1 tablet (50 mg total) by mouth 2 (two) times daily. 180 tablet 3   midodrine (PROAMATINE) 2.5 MG tablet Take 1 tablet (2.5 mg) by mouth three times a day  at 6 am, 10 am, & 2 pm 270 tablet 2   Multiple Vitamins-Minerals (MULTIVITAMIN WITH MINERALS) tablet Take 1 tablet by mouth daily.     Oil of Oregano 1500 MG CAPS Take 1,500 mg by mouth daily as needed (cold symptoms).     peppermint oil liquid Apply 1 application topically daily as needed (itchy/ cold symptoms).     Tea Tree Oil OIL Apply 1 application topically daily as needed (fungus).     vitamin C (ASCORBIC ACID) 500 MG tablet Take 500 mg by mouth daily.      No current facility-administered medications for this visit.    No Known Allergies  Indication: Patient presents with symptomatic varicose veins of the left lower extremity.  Procedure: Foam sclerotherapy was performed on the left lower extremity. Using ultrasound guidance, 5 mL of foam Sotradecol was used to inject the varicosities of the left lower extremity. Compression wraps were placed. The patient tolerated the procedure well.

## 2020-10-26 NOTE — Progress Notes (Signed)
Electrophysiology Office Follow up Visit Note:    Date:  10/27/2020   ID:  Brandy [name], DOB 02-Apr-1956, MRN [id number]  PCP:  Glean Hess, MD  [facility] Cardiologist:  None  [facility] Electrophysiologist: Dr. Caryl Comes   Interval History:    Brandy [name] is a 65 y.o. female who presents for a follow up visit. They were last seen in clinic October 20, 2020 for longstanding persistent atrial fibrillation.  At that appointment we scheduled her for a cardioversion which was performed on October 22, 2020 by Dr. Saunders Revel.  The patient successfully cardioverted to normal rhythm with 1 shock.  At the time of our last appointment, we elected to use the cardioversion to help determine whether or not she would stay in rhythm now that her left ventricular function had normalized.  We discussed potentially pursuing ablation if she was able to maintain normal rhythm for any significant period of time.  We also discussed using the cardioversion to determine whether or not she had symptoms attributable to her atrial fibrillation.  Today she presents normal sinus rhythm.  She is feeling stronger now that she is normal rhythm.  For several days after the cardioversion though she was weaker and she attributes that to the sedation/anesthesia.    Past Medical History:  Diagnosis Date   Arthritis    Chicken pox    Chronic systolic CHF (congestive heart failure) ([place])    a. 03/2016 Echo: Ef 15-20%, diff HK, ant AK;  b. 05/2016 Echo: Ef 30-35%, diff HK, mildly dil LA/RA.   Diabetes mellitus without complication (HCC)    Dysrhythmia    GERD (gastroesophageal reflux disease)    Heart murmur    Hip discomfort    been going on for 40 years    History of kidney stones    Hypertension    Moderate mitral regurgitation    a. 03/2016 Echo: mod MR in setting of LV dysfxn.   Motion sickness    car - back seat   NICM (nonischemic cardiomyopathy) ([place])    a. 03/2016 Echo: EF 15-20%, diff HK, ant AK, mod MR, mod dil LA,  mildly dil RA;  b. 04/2016 Cath: nl cors;  c. 05/2016 Echo: EF 30-35%, diff HK.   Obesity    Obstructive sleep apnea    compliant with CPAP   Persistent atrial fibrillation ([place])    a. Dx 03/2016;  b. CHA2DS2VASc = 2-->Eliquis 5mg  BID;  b. 05/2016 Failed DCCV x 4.   Sleep apnea    Visit for monitoring Tikosyn therapy 07/24/2016    Past Surgical History:  Procedure Laterality Date   BIOPSY N/A 01/12/2020   Procedure: BIOPSY;  Surgeon: Virgel Manifold, MD;  Location: [place];  Service: Endoscopy;  Laterality: N/A;   CARDIAC CATHETERIZATION N/A 04/17/2016   Procedure: Left Heart Cath and Coronary Angiography;  Surgeon: Wellington Hampshire, MD;  Location: [place] [facility];  Service: Cardiovascular;  Laterality: N/A;   CARDIOVERSION N/A 10/22/2020   Procedure: CARDIOVERSION;  Surgeon: Nelva Bush, MD;  Location: [facility];  Service: Cardiovascular;  Laterality: N/A;   CHOLECYSTECTOMY     COLON SURGERY  2021   COLONOSCOPY N/A 01/20/2020   Procedure: COLONOSCOPY;  Surgeon: Lesly Rubenstein, MD;  Location: [facility];  Service: Endoscopy;  Laterality: N/A;   COLONOSCOPY WITH PROPOFOL N/A 01/12/2020   Procedure: COLONOSCOPY WITH PROPOFOL;  Surgeon: Virgel Manifold, MD;  Location: [place];  Service: Endoscopy;  Laterality: N/A;  Diabetic - oral meds sleep apnea   ELECTROPHYSIOLOGIC STUDY N/A 05/26/2016   Procedure: Cardioversion;  Surgeon: Wellington Hampshire, MD;  Location: [facility];  Service: Cardiovascular;  Laterality: N/A;   ESOPHAGOGASTRODUODENOSCOPY (EGD) WITH PROPOFOL N/A 01/12/2020   Procedure: ESOPHAGOGASTRODUODENOSCOPY (EGD) WITH PROPOFOL;  Surgeon: Virgel Manifold, MD;  Location: [place];  Service: Endoscopy;  Laterality: N/A;   POLYPECTOMY N/A 01/12/2020   Procedure: POLYPECTOMY;  Surgeon: Virgel Manifold, MD;  Location: [place];  Service: Endoscopy;  Laterality: N/A;    Current Medications: Current Meds   Medication Sig   Cholecalciferol (VITAMIN D3) 1000 units CAPS Take 1,000 Units by mouth 2 (two) times daily.   ELIQUIS 5 MG TABS tablet Take 1 tablet by mouth twice daily   furosemide (LASIX) 20 MG tablet Take 1 tablet (20 mg total) by mouth daily as needed for fluid or edema.   glucose blood (ONETOUCH VERIO) test strip USE UP TO 4 TIMES DAILY AS DIRECTED   Lancets (ONETOUCH DELICA PLUS [id number]) MISC UP TO 4 TIMES DAILY AS DIRECTED   Lavender Oil OIL Apply 1 application topically daily as needed (stress).   Lemon Oil OIL Take 1 application by mouth daily as needed (add to water).   Lemongrass Oil OIL Apply 1 application topically daily as needed (multiple uses).   Magnesium Bisglycinate (MAG GLYCINATE) 100 MG TABS Take 200 mg by mouth in the morning, at noon, and at bedtime.   metFORMIN (GLUCOPHAGE-XR) 500 MG 24 hr tablet Take 1/2 tablet by mouth twice a day   metoprolol tartrate (LOPRESSOR) 50 MG tablet Take 1 tablet (50 mg total) by mouth 2 (two) times daily.   midodrine (PROAMATINE) 2.5 MG tablet Take 1 tablet (2.5 mg) by mouth three times a day at 6 am, 10 am, & 2 pm   Multiple Vitamins-Minerals (MULTIVITAMIN WITH MINERALS) tablet Take 1 tablet by mouth daily.   Oil of Oregano 1500 MG CAPS Take 1,500 mg by mouth daily as needed (cold symptoms).   peppermint oil liquid Apply 1 application topically daily as needed (itchy/ cold symptoms).   Tea Tree Oil OIL Apply 1 application topically daily as needed (fungus).   vitamin C (ASCORBIC ACID) 500 MG tablet Take 500 mg by mouth daily.      Allergies:   Patient has no known allergies.   Social History   Socioeconomic History   Marital status: Widowed    Spouse name: Deceased   Number of children: 1   Years of education: Not on file   Highest education level: Not on file  Occupational History   Not on file  Tobacco Use   Smoking status: Never   Smokeless tobacco: Never  Vaping Use   Vaping Use: Never used  Substance and Sexual  Activity   Alcohol use: Never   Drug use: Never   Sexual activity: Not Currently    Partners: Male  Other Topics Concern   Not on file  Social History Narrative   1 by birth 2 adopted   Pt lives alone   Social Determinants of Health   Financial Resource Strain: Low Risk    Difficulty of Paying Living Expenses: Not hard at all  Food Insecurity: No Food Insecurity   Worried About Charity fundraiser in the Last Year: Never true   Arboriculturist in the Last Year: Never true  Transportation Needs: No Transportation Needs   Lack of Transportation (Medical): No   Lack of Transportation (  Non-Medical): No  Physical Activity: Insufficiently Active   Days of Exercise per Week: 2 days   Minutes of Exercise per Session: 60 min  Stress: No Stress Concern Present   Feeling of Stress : Only a little  Social Connections: Moderately Integrated   Frequency of Communication with Friends and Family: More than three times a week   Frequency of Social Gatherings with Friends and Family: More than three times a week   Attends Religious Services: More than 4 times per year   Active Member of [employer] or Organizations: Yes   Attends Archivist Meetings: More than 4 times per year   Marital Status: Widowed     Family History: The patient's family history includes Anxiety disorder in her mother; Arthritis in her father and mother; Asthma in her mother; COPD in her father and mother; Cancer in her maternal aunt; Depression in her mother; Diabetes in her paternal aunt; Heart disease in her father; Hypertension in her father; Varicose Veins in her father.  ROS:   Please see the history of present illness.    All other systems reviewed and are negative.  EKGs/Labs/Other Studies Reviewed:    The following studies were reviewed today: Cardioversion records  EKG:  The ekg ordered today demonstrates sinus rhythm.  Ventricular rate 57 bpm.  Recent Labs: 01/18/2020: ALT 28 09/21/2020: Magnesium  2.3 10/20/2020: BUN 15; Creatinine, Ser 0.77; Hemoglobin 14.6; Platelets 238; Potassium 4.2; Sodium 138  Recent Lipid Panel    Component Value Date/Time   CHOL 191 10/31/2016 1457   TRIG 216.0 (H) 10/31/2016 1457   HDL 41.30 10/31/2016 1457   CHOLHDL 5 10/31/2016 1457   VLDL 43.2 (H) 10/31/2016 1457   LDLDIRECT 131.0 10/31/2016 1457    Physical Exam:    VS:  BP 130/72 (BP Location: Left Arm, Patient Position: Sitting, Cuff Size: Normal)   Pulse (!) 57   Ht 5\' 7"  (1.702 m)   Wt 209 lb (94.8 kg)   SpO2 98%   BMI 32.73 kg/m     Wt Readings from Last 3 Encounters:  10/27/20 209 lb (94.8 kg)  10/26/20 207 lb 12.8 oz (94.3 kg)  10/22/20 206 lb (93.4 kg)     GEN:  Well nourished, well developed in no acute distress.  Obese HEENT: Normal NECK: No JVD; No carotid bruits LYMPHATICS: No lymphadenopathy CARDIAC: RRR, no murmurs, rubs, gallops RESPIRATORY:  Clear to auscultation without rales, wheezing or rhonchi  ABDOMEN: Soft, non-tender, non-distended MUSCULOSKELETAL:  No edema; No deformity  SKIN: Warm and dry NEUROLOGIC:  Alert and oriented x 3 PSYCHIATRIC:  Normal affect   ASSESSMENT:    1. Longstanding persistent atrial fibrillation ([place])   2. NICM (nonischemic cardiomyopathy) ([place])   3. Pre-procedure lab exam   4. Persistent atrial fibrillation (HCC)    PLAN:    In order of problems listed above:  Persistent atrial fibrillation Patient is maintaining normal rhythm after recent cardioversion.  Given she has held onto normal rhythm for about a week now, I do not think she has permanent atrial fibrillation.  I suspect her 100 normalized left ventricular function is helping her maintain normal rhythm.  For this reason, I do think it is reasonable to pursue a rhythm control strategy.  We discussed the options as we had at our prior visit and the patient would like to pursue ablation.  I reviewed the ablation procedure, recovery, efficacy and risks.  I discussed the need for a  preprocedural CT pulmonary vein protocol  to assess her left atrial anatomy.  She would like to proceed with scheduling.  She is maintained on Eliquis for stroke prophylaxis.  Risk, benefits, and alternatives to EP study and radiofrequency ablation for afib were also discussed in detail today. These risks include but are not limited to stroke, bleeding, vascular damage, tamponade, perforation, damage to the esophagus, lungs, and other structures, pulmonary vein stenosis, worsening renal function, and death. The patient understands these risk and wishes to proceed.  We will therefore proceed with catheter ablation at the next available time.  Carto, ICE, anesthesia are requested for the procedure.  Will also obtain CT PV protocol prior to the procedure to exclude LAA thrombus and further evaluate atrial anatomy.  2.  Nonischemic cardiomyopathy Normalized left trickle function with most recent echo showing EF of 55%.  NYHA class II.  A rhythm control strategy is indicated because of her history of decreased ejection fraction.  Ablation as above.  Continue Lasix, metoprolol.   Total time spent with patient today 45 minutes. This includes reviewing records, evaluating the patient and coordinating care.   Medication Adjustments/Labs and Tests Ordered: Current medicines are reviewed at length with the patient today.  Concerns regarding medicines are outlined above.  Orders Placed This Encounter  Procedures   CT CARDIAC MORPH/PULM VEIN W/CM&W/O CA SCORE   Basic metabolic panel   CBC   EKG 12-Lead   No orders of the defined types were placed in this encounter.    Signed, Lars Mage, MD, [facility], [facility] 10/27/2020 6:49 PM    Electrophysiology [facility]

## 2020-10-27 ENCOUNTER — Ambulatory Visit: Payer: Medicare Other | Admitting: Cardiology

## 2020-10-27 ENCOUNTER — Encounter: Payer: Self-pay | Admitting: Cardiology

## 2020-10-27 VITALS — BP 130/72 | HR 57 | Ht 67.0 in | Wt 209.0 lb

## 2020-10-27 DIAGNOSIS — I4819 Other persistent atrial fibrillation: Secondary | ICD-10-CM | POA: Diagnosis not present

## 2020-10-27 DIAGNOSIS — Z01818 Encounter for other preprocedural examination: Secondary | ICD-10-CM

## 2020-10-27 DIAGNOSIS — I4811 Longstanding persistent atrial fibrillation: Secondary | ICD-10-CM | POA: Diagnosis not present

## 2020-10-27 DIAGNOSIS — Z01812 Encounter for preprocedural laboratory examination: Secondary | ICD-10-CM

## 2020-10-27 DIAGNOSIS — I428 Other cardiomyopathies: Secondary | ICD-10-CM

## 2020-10-27 NOTE — Patient Instructions (Addendum)
Medication Instructions:  Your physician recommends that you continue on your current medications as directed. Please refer to the Current Medication list given to you today.  *If you need a refill on your cardiac medications before your next appointment, please call your pharmacy*   Lab Work: None ordered If you have labs (blood work) drawn today and your tests are completely normal, you will receive your results only by: [place] (if you have MyChart) OR A paper copy in the mail If you have any lab test that is abnormal or we need to change your treatment, we will call you to review the results.   Testing/Procedures: Your physician has requested that you have cardiac CT (within 7 days PRIOR to ablation). Cardiac computed tomography (CT) is a painless test that uses an x-ray machine to take clear, detailed pictures of your heart. Please follow instruction sheet below under "other instructions".   Your physician has recommended that you have an ablation. Catheter ablation is a medical procedure used to treat some cardiac arrhythmias (irregular heartbeats). During catheter ablation, a long, thin, flexible tube is put into a blood vessel in your groin (upper thigh), or neck. This tube is called an ablation catheter. It is then guided to your heart through the blood vessel. Radio frequency waves destroy small areas of heart tissue where abnormal heartbeats may cause an arrhythmia to start. Please follow instruction sheet below under "other instructions".     Follow-Up: At [facility], you and your health needs are our priority.  As part of our continuing mission to provide you with exceptional heart care, we have created designated Provider Care Teams.  These Care Teams include your primary Cardiologist (physician) and Advanced Practice Providers (APPs -  Physician Assistants and Nurse Practitioners) who all work together to provide you with the care you need, when you need it.  Your  next appointment:   4 week(s) after your ablation  The format for your next appointment:   In Person  Provider:   You will follow up in the [facility] located at [facility]. Your provider will be: Roderic Palau, NP or Clint R. Fenton, PA-C    Thank you for choosing [facility]!!   Trinidad Curet, RN [phone]   Other Instructions  Your cardiac CT will be scheduled at one of the below locations:   [facility] [address] [place], [place] [address] [phone]  [place] [address] [place], [place] [address] [phone]  If scheduled at [facility], please arrive at the [facility] main entrance (entrance A) of [facility] 30 minutes prior to test start time. Proceed to the [facility] Radiology Department (first floor) to check-in and test prep.  If scheduled at [facility], please arrive 15 mins early for check-in and test prep.  Please follow these instructions carefully (unless otherwise directed):  On the Night Before the Test: Be sure to Drink plenty of water. Do not consume any caffeinated/decaffeinated beverages or chocolate 12 hours prior to your test. Do not take any antihistamines 12 hours prior to your test.  On the Day of the Test: Drink plenty of water until 1 hour prior to the test. Do not eat any food 4 hours prior to the test. You may take your regular medications prior to the test.  HOLD Furosemide/Hydrochlorothiazide morning of the test. FEMALES- please wear underwire-free bra if available  After the Test: Drink plenty of water. After receiving IV contrast, you may experience a mild flushed feeling. This is normal. On occasion, you may experience a mild rash up to 24 hours after the test. This is not dangerous. If this occurs, you can take Benadryl 25 mg and increase your fluid intake. If you  experience trouble breathing, this can be serious. If it is severe call 911 IMMEDIATELY. If it is mild, please call our office. If you take any of these medications: Glipizide/Metformin, Avandament, Glucavance, please do not take 48 hours after completing test unless otherwise instructed.   Once we have confirmed authorization from your insurance company, we will call you to set up a date and time for your test. Based on how quickly your insurance processes prior authorizations requests, please allow up to 4 weeks to be contacted for scheduling your Cardiac CT appointment. Be advised that routine Cardiac CT appointments could be scheduled as many as 8 weeks after your provider has ordered it.  For non-scheduling related questions, please contact the cardiac imaging nurse navigator should you have any questions/concerns: Marchia Bond, Cardiac Imaging Nurse Navigator Gordy Clement, Cardiac Imaging Nurse Navigator [facility] Direct Office Dial: [phone]   For scheduling needs, including cancellations and rescheduling, please call [place], [phone].      Electrophysiology/Ablation Procedure Instructions   You are scheduled for a(n)  ablation on 12/09/2020 with Dr. Allegra Lai.   1.   Pre procedure testing-             A.  LAB WORK --- between 7/5 - 7/15  for your pre procedure blood work.  You do NOT need to be fasting. You can stop by the medical mall.   PROCEDURE DAY: On the day of your procedure 12/09/2020 you will go to [facility] [phone]) at 5:30 am.  Dennis Bast will go to the main entrance A [employer]) and enter where the [employer] are.  Your driver will drop you off and you will head down the hallway to ADMITTING.  You may have one support person come in to the hospital with you.  They will be asked to wait in the waiting room.  It is OK to have someone drop you off and come back when you are ready to be discharged.   3.    Do not eat or drink after midnight prior to your procedure.   4.   Do not miss any doses of your blood thinner prior to the morning of your procedure or your procedure will need to be rescheduled.       Do NOT take any medications the morning of your procedure.   5.  Plan for an overnight stay, but you may be discharged home after your procedure. If you use your phone frequently bring your phone charger, in case you have to stay.  If you are discharged after your procedure you will need someone to drive you home and be with your for 24 hours after your procedure.   6. You will follow up with the AFIB clinic 4 weeks after your procedure.  You will follow up with Dr. Curt Bears  3 months after your procedure.  These appointments will be made for you.   * If you have ANY questions please call the office [phone] and ask for Dallana Mavity RN or send me a MyChart message   * Occasionally, EP Studies and ablations can become lengthy.  Please make your family aware of this before your procedure starts.  Average time ranges from 2-8 hours for EP studies/ablations.  Your physician will call your family after the procedure with the results.                                   Cardiac Ablation Cardiac ablation is a procedure to destroy (ablate) some heart tissue that is sending bad signals. These bad signals causeproblems in heart rhythm. The heart has many areas that make these signals. If there are problems in these areas, they can make the heart beat in a way that is not normal.Destroying some tissues can help make the heart rhythm normal. Tell your doctor about: Any allergies you have. All medicines you are taking. These include vitamins, herbs, eye drops, creams, and over-the-counter medicines. Any problems you or family members have had with medicines that make you fall asleep (anesthetics). Any blood disorders you have. Any surgeries you have had. Any medical conditions you have, such as kidney  failure. Whether you are pregnant or may be pregnant. What are the risks? This is a safe procedure. But problems may occur, including: Infection. Bruising and bleeding. Bleeding into the chest. Stroke or blood clots. Damage to nearby areas of your body. Allergies to medicines or dyes. The need for a pacemaker if the normal system is damaged. Failure of the procedure to treat the problem. What happens before the procedure? Medicines Ask your doctor about: Changing or stopping your normal medicines. This is important. Taking aspirin and ibuprofen. Do not take these medicines unless your doctor tells you to take them. Taking other medicines, vitamins, herbs, and supplements. General instructions Follow instructions from your doctor about what you cannot eat or drink. Plan to have someone take you home from the hospital or clinic. If you will be going home right after the procedure, plan to have someone with you for 24 hours. Ask your doctor what steps will be taken to prevent infection. What happens during the procedure?  An IV tube will be put into one of your veins. You will be given a medicine to help you relax. The skin on your neck or groin will be numbed. A cut (incision) will be made in your neck or groin. A needle will be put through your cut and into a large vein. A tube (catheter) will be put into the needle. The tube will be moved to your heart. Dye may be put through the tube. This helps your doctor see your heart. Small devices (electrodes) on the tube will send out signals. A type of energy will be used to destroy some heart tissue. The tube will be taken out. Pressure will be held on your cut. This helps stop bleeding. A bandage will be put over your cut. The exact procedure may vary among doctors and hospitals. What happens after the procedure? You will be watched until you leave the hospital or clinic. This includes checking your heart rate, breathing rate, oxygen,  and blood pressure. Your cut will be watched for bleeding. You will need to lie still for a few hours. Do not drive for 24 hours or as long as your doctor tells you. Summary Cardiac ablation is a procedure to destroy some heart tissue. This is done to treat heart rhythm problems. Tell your doctor about any medical conditions you may have. Tell him or her about all medicines you are  taking to treat them. This is a safe procedure. But problems may occur. These include infection, bruising, bleeding, and damage to nearby areas of your body. Follow what your doctor tells you about food and drink. You may also be told to change or stop some of your medicines. After the procedure, do not drive for 24 hours or as long as your doctor tells you. This information is not intended to replace advice given to you by your health care provider. Make sure you discuss any questions you have with your healthcare provider. Document Revised: 04/03/2019 Document Reviewed: 04/03/2019 Elsevier Patient Education  2022 [employer].

## 2020-11-01 ENCOUNTER — Encounter: Payer: Self-pay | Admitting: Internal Medicine

## 2020-11-01 ENCOUNTER — Other Ambulatory Visit: Payer: Self-pay

## 2020-11-01 ENCOUNTER — Ambulatory Visit: Payer: Medicare Other | Admitting: Internal Medicine

## 2020-11-01 VITALS — BP 106/66 | HR 54 | Ht 67.0 in | Wt 210.2 lb

## 2020-11-01 DIAGNOSIS — I428 Other cardiomyopathies: Secondary | ICD-10-CM

## 2020-11-01 DIAGNOSIS — I482 Chronic atrial fibrillation, unspecified: Secondary | ICD-10-CM | POA: Diagnosis not present

## 2020-11-01 MED ORDER — METOPROLOL TARTRATE 50 MG PO TABS: 25.0000 mg | ORAL_TABLET | Freq: Two times a day (BID) | ORAL | 3 refills | Status: DC

## 2020-11-01 NOTE — Progress Notes (Signed)
Patient ID: Brandy [name], female   DOB: [birth year], 65 y.o.   MRN: [id number]      Patient Care Team: Glean Hess, MD as PCP - General (Internal Medicine) Deboraha Sprang, MD as Consulting Physician (Cardiology) Elgie Collard, MD as Referring Physician (Obstetrics and Gynecology) Haig Prophet Hilton Cork, MD as Consulting Physician (Gastroenterology) Lucky Cowboy Erskine Squibb, MD as Referring Physician (Vascular Surgery) Caroline More, DPM as Consulting Physician (Podiatry) Arelia Sneddon, OD (Optometry)   HPI  Brandy [name] is a 65 y.o. female Seen in follow-up for atrial fibrillation for which dofetilide was started 2/18 with spontaneous conversion.She had recurrent episodes of atrial fibrillation.  She was referred to Dr. [name] to consider ablation.  It was elected to defer.  The decision was made for permanent atrial fibrillation.  There has been interval normalization of LV function with rate control.  She saw Dr. Daune Perch who undertook cardioversion.  She felt considerably better in sinus rhythm and the decision has been made to pursue catheter ablation  She has a history of nonischemic cardiomyopathy with persistent left ventricular dysfunction occurring in the context of her atrial fibrillation with rapid and poorly controlled rates. .     Today, the patient denies chest pain, nocturnal dyspnea, orthopnea or peripheral edema.  There have been no palpitations, lightheadedness or syncope.  Complains of occasional dizziness.and shortness of breath    She have been doing well but have experienced occasional dizziness and fatigue otherwise she is well and get excited when she notice she is in sinus rhythm via her I watch details  She notes her dizziness has improved  She does not have stairs ,she can walk 2/10 th of a mile to her mail box without getting exceptionally out of breath:she notes if she  mossies   stop to pick a weed or two while on her walk she does better  She notes she will start a  gym regimen 11/02/20  LV function has normalized with rate control Thromboembolic risk factors ( HTN-1, CHF-1, Gender-1) for a CHADSVASc Score of  3   She is a Pharmacist, 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  Date Cr K Mg Dig Hgb  3/18  0.76 3.9    14.5 (2/18)  4/18  0.76 4.2 2.0    5/18 0.83 4.9   15.1  2/19 0.84 4.1 2.1    10/19 0.96 4.5 2.3  13.6    9/21 0.78 3.7  0.7 9.4 >>10.7  6/22 0.77 4.2   14.6   DATE TEST EF   11/17 Echo  15-20 %   1/18 Echo  35 %   5/18 Echo  50-55% LA size-normal   8/20 Echo 60-65%   5/22 Echo 55-60%        Past Medical History:  Diagnosis Date   Arthritis    Chicken pox    Chronic systolic CHF (congestive heart failure) ([place])    a. 03/2016 Echo: Ef 15-20%, diff HK, ant AK;  b. 05/2016 Echo: Ef 30-35%, diff HK, mildly dil LA/RA.   Diabetes mellitus without complication (HCC)    Dysrhythmia    GERD (gastroesophageal reflux disease)    Heart murmur    Hip discomfort    been going on for 40 years    History of kidney stones    Hypertension    Moderate mitral regurgitation    a. 03/2016 Echo: mod MR in setting of LV dysfxn.   Motion sickness    car - back seat  NICM (nonischemic cardiomyopathy) ([place])    a. 03/2016 Echo: EF 15-20%, diff HK, ant AK, mod MR, mod dil LA, mildly dil RA;  b. 04/2016 Cath: nl cors;  c. 05/2016 Echo: EF 30-35%, diff HK.   Obesity    Obstructive sleep apnea    compliant with CPAP   Persistent atrial fibrillation ([place])    a. Dx 03/2016;  b. CHA2DS2VASc = 2-->Eliquis 5mg  BID;  b. 05/2016 Failed DCCV x 4.   Sleep apnea    Visit for monitoring Tikosyn therapy 07/24/2016    Past Surgical History:  Procedure Laterality Date   BIOPSY N/A 01/12/2020   Procedure: BIOPSY;  Surgeon: Virgel Manifold, MD;  Location: [facility];  Service: Endoscopy;  Laterality: N/A;   CARDIAC CATHETERIZATION N/A 04/17/2016   Procedure: Left Heart Cath and Coronary Angiography;  Surgeon: Wellington Hampshire, MD;  Location: [place]  [facility];  Service: Cardiovascular;  Laterality: N/A;   CARDIOVERSION N/A 10/22/2020   Procedure: CARDIOVERSION;  Surgeon: Nelva Bush, MD;  Location: [facility];  Service: Cardiovascular;  Laterality: N/A;   CHOLECYSTECTOMY     COLON SURGERY  2021   COLONOSCOPY N/A 01/20/2020   Procedure: COLONOSCOPY;  Surgeon: Lesly Rubenstein, MD;  Location: [facility];  Service: Endoscopy;  Laterality: N/A;   COLONOSCOPY WITH PROPOFOL N/A 01/12/2020   Procedure: COLONOSCOPY WITH PROPOFOL;  Surgeon: Virgel Manifold, MD;  Location: [facility];  Service: Endoscopy;  Laterality: N/A;  Diabetic - oral meds sleep apnea   ELECTROPHYSIOLOGIC STUDY N/A 05/26/2016   Procedure: Cardioversion;  Surgeon: Wellington Hampshire, MD;  Location: [facility];  Service: Cardiovascular;  Laterality: N/A;   ESOPHAGOGASTRODUODENOSCOPY (EGD) WITH PROPOFOL N/A 01/12/2020   Procedure: ESOPHAGOGASTRODUODENOSCOPY (EGD) WITH PROPOFOL;  Surgeon: Virgel Manifold, MD;  Location: [place] [facility];  Service: Endoscopy;  Laterality: N/A;   POLYPECTOMY N/A 01/12/2020   Procedure: POLYPECTOMY;  Surgeon: Virgel Manifold, MD;  Location: [facility];  Service: Endoscopy;  Laterality: N/A;    Current Outpatient Medications  Medication Sig Dispense Refill   Cholecalciferol (VITAMIN D3) 1000 units CAPS Take 1,000 Units by mouth 2 (two) times daily.     ELIQUIS 5 MG TABS tablet Take 1 tablet by mouth twice daily 180 tablet 1   furosemide (LASIX) 20 MG tablet Take 1 tablet (20 mg total) by mouth daily as needed for fluid or edema. 30 tablet 0   glucose blood (ONETOUCH VERIO) test strip USE UP TO 4 TIMES DAILY AS DIRECTED 100 each 1   Lancets (ONETOUCH DELICA PLUS [id number]) MISC UP TO 4 TIMES DAILY AS DIRECTED 100 each 1   Lavender Oil OIL Apply 1 application topically daily as needed (stress).     Lemon Oil OIL Take 1 application by mouth daily as needed (add to water).     Lemongrass Oil OIL Apply 1 application  topically daily as needed (multiple uses).     Magnesium Bisglycinate (MAG GLYCINATE) 100 MG TABS Take 200 mg by mouth in the morning, at noon, and at bedtime.     metFORMIN (GLUCOPHAGE-XR) 500 MG 24 hr tablet Take 1/2 tablet by mouth twice a day 90 tablet 3   metoprolol tartrate (LOPRESSOR) 50 MG tablet Take 1 tablet (50 mg total) by mouth 2 (two) times daily. 180 tablet 3   midodrine (PROAMATINE) 2.5 MG tablet Take 1 tablet (2.5 mg) by mouth three times a day at 6 am, 10 am, & 2 pm 270 tablet 2  Multiple Vitamins-Minerals (MULTIVITAMIN WITH MINERALS) tablet Take 1 tablet by mouth daily.     Oil of Oregano 1500 MG CAPS Take 1,500 mg by mouth daily as needed (cold symptoms).     peppermint oil liquid Apply 1 application topically daily as needed (itchy/ cold symptoms).     Tea Tree Oil OIL Apply 1 application topically daily as needed (fungus).     vitamin C (ASCORBIC ACID) 500 MG tablet Take 500 mg by mouth daily.      No current facility-administered medications for this visit.    No Known Allergies  Physical Exam: BP 106/66   Pulse (!) 54   Ht 5\' 7"  (1.702 m)   Wt 210 lb 3.2 oz (95.3 kg)   SpO2 92%   BMI 32.92 kg/m  Well developed and Morbidly obese in no acute distress HENT normal Neck supple with JVP-flat Lungs Clear Slow rate and regular rhythm,  No  gallop No  murmur Abd-soft with active BS No Clubbing cyanosis No edema Skin-warm and dry A & Oriented  Grossly normal sensory and motor function   EKG: Sinus at 54 122/07/43 Otherwise normal  Assessment and  Plan  Atrial fibrillation-persistent long-term   Cardiomyopathy-- nonischemic-rate related resolved  Hypertension/orthostasis  Sinus bradycardia  Obesity resolving  Atrial fibrillation previously thought to be permanent was successfully cardioverted, this in the context of interval normalization of LV function.  She feels considerably better.  She Dr. Quentin Ore and had discussions and will be proceeding with  catheter ablation 7/22.  We will continue her on her Eliquis 5 mg twice daily.  No clinical bleeding.  Lightheadedness is much improved with the restoration of sinus rhythm.  We will discontinue her ProAmatine.  Sinus bradycardia is now evident.  Not a big surprise after all these years of atrial fibrillation.  We will decrease her metoprolol from 50--25 mg twice daily.  I have asked her to follow-up with her PCP regarding switching her Glucophage to an SGLT2 for her diabetes given her dyspnea and her mild persistent left ventricular dysfunction    Acute   I,Stephanie Williams,acting as a scribe for Virl Axe, MD.,have documented all relevant documentation on the behalf of Virl Axe, MD,as directed by  Virl Axe, MD while in the presence of Virl Axe, MD.  I, Virl Axe, MD, have reviewed all documentation for this visit. The documentation on 11/01/20 for the exam, diagnosis, procedures, and orders are all accurate and complete.

## 2020-11-01 NOTE — Patient Instructions (Signed)
Medication Instructions:  Your physician has recommended you make the following change in your medication:  ** Stop Proamatine  ** Decrease Metoprolol Tart from 50mg  - 1 tablet twice daily to Maetoprolol Tart 25mg  - 1/2 tablet by mouth twice daily.  *If you need a refill on your cardiac medications before your next appointment, please call your pharmacy*   Lab Work: None ordered.  If you have labs (blood work) drawn today and your tests are completely normal, you will receive your results only by: [place] (if you have MyChart) OR A paper copy in the mail If you have any lab test that is abnormal or we need to change your treatment, we will call you to review the results.   Testing/Procedures: None ordered.    Follow-Up: At [facility], you and your health needs are our priority.  As part of our continuing mission to provide you with exceptional heart care, we have created designated Provider Care Teams.  These Care Teams include your primary Cardiologist (physician) and Advanced Practice Providers (APPs -  Physician Assistants and Nurse Practitioners) who all work together to provide you with the care you need, when you need it.  We recommend signing up for the patient portal called "MyChart".  Sign up information is provided on this After Visit Summary.  MyChart is used to connect with patients for Virtual Visits (Telemedicine).  Patients are able to view lab/test results, encounter notes, upcoming appointments, etc.  Non-urgent messages can be sent to your provider as well.   To learn more about what you can do with MyChart, go to [email or web address].    Your next appointment:   6 month(s)  The format for your next appointment:   In Person  Provider:   Virl Axe, MD

## 2020-11-16 ENCOUNTER — Other Ambulatory Visit
Admission: RE | Admit: 2020-11-16 | Discharge: 2020-11-16 | Disposition: A | Discharge status: Home / Self Care | Payer: Medicare Other | Attending: Cardiology | Admitting: Cardiology

## 2020-11-16 DIAGNOSIS — I4811 Longstanding persistent atrial fibrillation: Secondary | ICD-10-CM | POA: Diagnosis not present

## 2020-11-16 DIAGNOSIS — Z01812 Encounter for preprocedural laboratory examination: Secondary | ICD-10-CM

## 2020-11-16 LAB — BASIC METABOLIC PANEL
Anion gap: 4 — ABNORMAL LOW (ref 5–15)
BUN: 15 mg/dL (ref 8–23)
CO2: 27 mmol/L (ref 22–32)
Calcium: 9.4 mg/dL (ref 8.9–10.3)
Chloride: 109 mmol/L (ref 98–111)
Creatinine, Ser: 0.76 mg/dL (ref 0.44–1.00)
GFR, Estimated: >60 mL/min (ref >60)
Glucose, Bld: 89 mg/dL (ref 70–99)
Potassium: 3.9 mmol/L (ref 3.5–5.1)
Sodium: 140 mmol/L (ref 135–145)

## 2020-11-16 LAB — CBC
HCT: 42.7 % (ref 36.0–46.0)
Hemoglobin: 14.6 g/dL (ref 12.0–15.0)
MCH: 31.6 pg (ref 26.0–34.0)
MCHC: 34.2 g/dL (ref 30.0–36.0)
MCV: 92.4 fL (ref 80.0–100.0)
Platelets: 267 10*3/uL (ref 150–400)
RBC: 4.62 MIL/uL (ref 3.87–5.11)
RDW: 12.6 % (ref 11.5–15.5)
WBC: 6.5 10*3/uL (ref 4.0–10.5)
nRBC: 0 % (ref 0.0–0.2)

## 2020-11-22 ENCOUNTER — Ambulatory Visit (INDEPENDENT_AMBULATORY_CARE_PROVIDER_SITE_OTHER): Payer: Medicare Other | Admitting: Internal Medicine

## 2020-11-22 ENCOUNTER — Other Ambulatory Visit: Payer: Self-pay

## 2020-11-22 ENCOUNTER — Encounter: Payer: Self-pay | Admitting: Internal Medicine

## 2020-11-22 VITALS — BP 124/72 | HR 58 | Temp 97.9°F | Ht 67.0 in | Wt 207.0 lb

## 2020-11-22 DIAGNOSIS — I482 Chronic atrial fibrillation, unspecified: Secondary | ICD-10-CM

## 2020-11-22 DIAGNOSIS — D6869 Other thrombophilia: Secondary | ICD-10-CM

## 2020-11-22 DIAGNOSIS — E118 Type 2 diabetes mellitus with unspecified complications: Secondary | ICD-10-CM

## 2020-11-22 LAB — POCT GLYCOSYLATED HEMOGLOBIN (HGB A1C): Hemoglobin A1C: 5.3 % (ref 4.0–5.6)

## 2020-11-22 MED ORDER — ONETOUCH DELICA PLUS LANCET33G MISC: 1 refills | Status: DC

## 2020-11-22 MED ORDER — ONETOUCH VERIO VI STRP: ORAL_STRIP | 1 refills | Status: DC
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
## 2020-11-30 ENCOUNTER — Telehealth (HOSPITAL_COMMUNITY): Payer: Self-pay | Admitting: *Deleted

## 2020-11-30 NOTE — Telephone Encounter (Signed)
Reaching out to patient to offer assistance regarding upcoming cardiac imaging study; pt verbalizes understanding of appt date/time, parking situation and where to check in, pre-test NPO status, and verified current allergies; name and call back number provided for further questions should they arise  Annibelle Brazie RN Navigator Cardiac Imaging [facility] [phone] office [phone] cell  

## 2020-12-02 ENCOUNTER — Other Ambulatory Visit: Payer: Self-pay

## 2020-12-02 ENCOUNTER — Ambulatory Visit
Admission: RE | Admit: 2020-12-02 | Discharge: 2020-12-02 | Disposition: A | Discharge status: Home / Self Care | Payer: Medicare Other | Source: Ambulatory Visit | Attending: Cardiology | Admitting: Cardiology

## 2020-12-02 DIAGNOSIS — I4819 Other persistent atrial fibrillation: Secondary | ICD-10-CM | POA: Insufficient documentation

## 2020-12-02 MED ORDER — IOHEXOL 350 MG/ML SOLN
100.0000 mL | Freq: Once | INTRAVENOUS | Status: AC | PRN
  Administered 2020-12-02: 100 mL via INTRAVENOUS

## 2020-12-08 NOTE — Pre-Procedure Instructions (Signed)
Attempted to contact patient regarding procedure instructions for tomorrow. Left voicemail on the following items: Arrival time 0530 Nothing to eat or drink after midnight No meds AM of procedure Responsible person to drive you home and stay with you for 24 hrs  Have you missed any doses of anti-coagulant Eliquis- take both doses today

## 2020-12-09 ENCOUNTER — Encounter (HOSPITAL_COMMUNITY)
Admission: RE | Disposition: A | Discharge status: Home / Self Care | Payer: Medicare Other | Source: Home / Self Care | Attending: Cardiology

## 2020-12-09 ENCOUNTER — Other Ambulatory Visit: Payer: Self-pay

## 2020-12-09 ENCOUNTER — Ambulatory Visit (HOSPITAL_COMMUNITY): Payer: Medicare Other | Admitting: Anesthesiology

## 2020-12-09 ENCOUNTER — Encounter (HOSPITAL_COMMUNITY): Payer: Self-pay | Admitting: Cardiology

## 2020-12-09 ENCOUNTER — Ambulatory Visit (HOSPITAL_COMMUNITY)
Admission: RE | Admit: 2020-12-09 | Discharge: 2020-12-09 | Disposition: A | Discharge status: Home / Self Care | Payer: Medicare Other | Attending: Cardiology | Admitting: Cardiology

## 2020-12-09 DIAGNOSIS — I340 Nonrheumatic mitral (valve) insufficiency: Secondary | ICD-10-CM | POA: Insufficient documentation

## 2020-12-09 DIAGNOSIS — I110 Hypertensive heart disease with heart failure: Secondary | ICD-10-CM | POA: Diagnosis not present

## 2020-12-09 DIAGNOSIS — Z9049 Acquired absence of other specified parts of digestive tract: Secondary | ICD-10-CM | POA: Insufficient documentation

## 2020-12-09 DIAGNOSIS — Z6832 Body mass index (BMI) 32.0-32.9, adult: Secondary | ICD-10-CM | POA: Diagnosis not present

## 2020-12-09 DIAGNOSIS — I4819 Other persistent atrial fibrillation: Secondary | ICD-10-CM | POA: Diagnosis not present

## 2020-12-09 DIAGNOSIS — Z7901 Long term (current) use of anticoagulants: Secondary | ICD-10-CM | POA: Diagnosis not present

## 2020-12-09 DIAGNOSIS — Z79899 Other long term (current) drug therapy: Secondary | ICD-10-CM | POA: Insufficient documentation

## 2020-12-09 DIAGNOSIS — G4733 Obstructive sleep apnea (adult) (pediatric): Secondary | ICD-10-CM | POA: Insufficient documentation

## 2020-12-09 DIAGNOSIS — Z8619 Personal history of other infectious and parasitic diseases: Secondary | ICD-10-CM | POA: Diagnosis not present

## 2020-12-09 DIAGNOSIS — E119 Type 2 diabetes mellitus without complications: Secondary | ICD-10-CM | POA: Insufficient documentation

## 2020-12-09 DIAGNOSIS — K317 Polyp of stomach and duodenum: Secondary | ICD-10-CM | POA: Diagnosis not present

## 2020-12-09 DIAGNOSIS — I428 Other cardiomyopathies: Secondary | ICD-10-CM | POA: Diagnosis not present

## 2020-12-09 DIAGNOSIS — Z8249 Family history of ischemic heart disease and other diseases of the circulatory system: Secondary | ICD-10-CM | POA: Insufficient documentation

## 2020-12-09 DIAGNOSIS — I482 Chronic atrial fibrillation, unspecified: Secondary | ICD-10-CM | POA: Diagnosis not present

## 2020-12-09 DIAGNOSIS — I5022 Chronic systolic (congestive) heart failure: Secondary | ICD-10-CM | POA: Diagnosis not present

## 2020-12-09 DIAGNOSIS — E669 Obesity, unspecified: Secondary | ICD-10-CM | POA: Insufficient documentation

## 2020-12-09 DIAGNOSIS — Z833 Family history of diabetes mellitus: Secondary | ICD-10-CM | POA: Insufficient documentation

## 2020-12-09 DIAGNOSIS — K219 Gastro-esophageal reflux disease without esophagitis: Secondary | ICD-10-CM | POA: Diagnosis not present

## 2020-12-09 HISTORY — PX: ATRIAL FIBRILLATION ABLATION: EP1191

## 2020-12-09 LAB — GLUCOSE, CAPILLARY
Glucose-Capillary: 105 mg/dL — ABNORMAL HIGH (ref 70–99)
Glucose-Capillary: 141 mg/dL — ABNORMAL HIGH (ref 70–99)

## 2020-12-09 LAB — POCT ACTIVATED CLOTTING TIME
Activated Clotting Time: 317 seconds
Activated Clotting Time: 329 seconds
Activated Clotting Time: 347 seconds
Activated Clotting Time: 300 seconds

## 2020-12-09 SURGERY — ATRIAL FIBRILLATION ABLATION
Anesthesia: General

## 2020-12-09 MED ORDER — SODIUM CHLORIDE 0.9 % IV SOLN: INTRAVENOUS | Status: DC

## 2020-12-09 MED ORDER — ONDANSETRON HCL 4 MG/2ML IJ SOLN: 4.0000 mg | Freq: Four times a day (QID) | INTRAMUSCULAR | Status: DC | PRN

## 2020-12-09 MED ORDER — COLCHICINE 0.6 MG PO TABS
0.6000 mg | ORAL_TABLET | Freq: Two times a day (BID) | ORAL | Status: DC
  Administered 2020-12-09: 0.6 mg via ORAL
  Filled 2020-12-09: qty 1

## 2020-12-09 MED ORDER — HEPARIN SODIUM (PORCINE) 1000 UNIT/ML IJ SOLN
INTRAMUSCULAR | Status: DC | PRN
  Administered 2020-12-09: 3000 [IU] via INTRAVENOUS
  Administered 2020-12-09: 4000 [IU] via INTRAVENOUS
  Administered 2020-12-09: 16000 [IU] via INTRAVENOUS

## 2020-12-09 MED ORDER — ISOPROTERENOL HCL 0.2 MG/ML IJ SOLN
INTRAMUSCULAR | Status: AC
  Filled 2020-12-09: qty 5

## 2020-12-09 MED ORDER — SODIUM CHLORIDE 0.9% FLUSH: 3.0000 mL | Freq: Two times a day (BID) | INTRAVENOUS | Status: DC

## 2020-12-09 MED ORDER — HEPARIN SODIUM (PORCINE) 1000 UNIT/ML IJ SOLN
INTRAMUSCULAR | Status: AC
  Filled 2020-12-09: qty 1

## 2020-12-09 MED ORDER — MIDAZOLAM HCL 2 MG/2ML IJ SOLN
INTRAMUSCULAR | Status: DC | PRN
  Administered 2020-12-09: 2 mg via INTRAVENOUS

## 2020-12-09 MED ORDER — LIDOCAINE 2% (20 MG/ML) 5 ML SYRINGE
INTRAMUSCULAR | Status: DC | PRN
  Administered 2020-12-09: 60 mg via INTRAVENOUS

## 2020-12-09 MED ORDER — ONDANSETRON HCL 4 MG/2ML IJ SOLN
INTRAMUSCULAR | Status: DC | PRN
  Administered 2020-12-09: 4 mg via INTRAVENOUS

## 2020-12-09 MED ORDER — METOPROLOL TARTRATE 25 MG PO TABS
25.0000 mg | ORAL_TABLET | Freq: Two times a day (BID) | ORAL | Status: DC
  Administered 2020-12-09: 25 mg via ORAL
  Filled 2020-12-09: qty 1

## 2020-12-09 MED ORDER — PHENYLEPHRINE HCL-NACL 10-0.9 MG/250ML-% IV SOLN
INTRAVENOUS | Status: DC | PRN
  Administered 2020-12-09: 25 ug/min via INTRAVENOUS

## 2020-12-09 MED ORDER — PANTOPRAZOLE SODIUM 40 MG PO TBEC: 40.0000 mg | DELAYED_RELEASE_TABLET | Freq: Every day | ORAL | 0 refills | Status: DC

## 2020-12-09 MED ORDER — HEPARIN (PORCINE) IN NACL 1000-0.9 UT/500ML-% IV SOLN
INTRAVENOUS | Status: DC | PRN
  Administered 2020-12-09 (×4): 500 mL

## 2020-12-09 MED ORDER — APIXABAN 5 MG PO TABS
5.0000 mg | ORAL_TABLET | Freq: Two times a day (BID) | ORAL | Status: DC
  Administered 2020-12-09: 5 mg via ORAL
  Filled 2020-12-09: qty 1

## 2020-12-09 MED ORDER — ACETAMINOPHEN 325 MG PO TABS
650.0000 mg | ORAL_TABLET | ORAL | Status: DC | PRN
  Filled 2020-12-09: qty 2

## 2020-12-09 MED ORDER — FENTANYL CITRATE (PF) 250 MCG/5ML IJ SOLN
INTRAMUSCULAR | Status: DC | PRN
  Administered 2020-12-09: 100 ug via INTRAVENOUS

## 2020-12-09 MED ORDER — ROCURONIUM BROMIDE 10 MG/ML (PF) SYRINGE
PREFILLED_SYRINGE | INTRAVENOUS | Status: DC | PRN
  Administered 2020-12-09: 60 mg via INTRAVENOUS

## 2020-12-09 MED ORDER — COLCHICINE 0.6 MG PO TABS: 0.6000 mg | ORAL_TABLET | Freq: Two times a day (BID) | ORAL | 0 refills | Status: DC

## 2020-12-09 MED ORDER — DEXAMETHASONE SODIUM PHOSPHATE 10 MG/ML IJ SOLN
INTRAMUSCULAR | Status: DC | PRN
  Administered 2020-12-09: 5 mg via INTRAVENOUS

## 2020-12-09 MED ORDER — SODIUM CHLORIDE 0.9 % IV SOLN: 250.0000 mL | INTRAVENOUS | Status: DC | PRN

## 2020-12-09 MED ORDER — ISOPROTERENOL HCL 0.2 MG/ML IJ SOLN
INTRAVENOUS | Status: DC | PRN
  Administered 2020-12-09: 2 ug/min via INTRAVENOUS

## 2020-12-09 MED ORDER — PANTOPRAZOLE SODIUM 40 MG PO TBEC
40.0000 mg | DELAYED_RELEASE_TABLET | Freq: Every day | ORAL | Status: DC
  Administered 2020-12-09: 40 mg via ORAL
  Filled 2020-12-09: qty 1

## 2020-12-09 MED ORDER — SUGAMMADEX SODIUM 200 MG/2ML IV SOLN
INTRAVENOUS | Status: DC | PRN
  Administered 2020-12-09: 200 mg via INTRAVENOUS

## 2020-12-09 MED ORDER — SODIUM CHLORIDE 0.9% FLUSH: 3.0000 mL | INTRAVENOUS | Status: DC | PRN

## 2020-12-09 MED ORDER — ACETAMINOPHEN 325 MG PO TABS
ORAL_TABLET | ORAL | Status: AC
  Administered 2020-12-09: 650 mg via ORAL
  Filled 2020-12-09: qty 2

## 2020-12-09 MED ORDER — PROTAMINE SULFATE 10 MG/ML IV SOLN
INTRAVENOUS | Status: DC | PRN
  Administered 2020-12-09: 30 mg via INTRAVENOUS

## 2020-12-09 MED ORDER — HEPARIN (PORCINE) IN NACL 1000-0.9 UT/500ML-% IV SOLN
INTRAVENOUS | Status: AC
  Filled 2020-12-09: qty 1500

## 2020-12-09 MED ORDER — HEPARIN SODIUM (PORCINE) 1000 UNIT/ML IJ SOLN
INTRAMUSCULAR | Status: DC | PRN
  Administered 2020-12-09: 1000 [IU] via INTRAVENOUS

## 2020-12-09 MED ORDER — PROPOFOL 10 MG/ML IV BOLUS
INTRAVENOUS | Status: DC | PRN
  Administered 2020-12-09: 150 mg via INTRAVENOUS
  Administered 2020-12-09: 50 mg via INTRAVENOUS

## 2020-12-09 SURGICAL SUPPLY — 19 items
BLANKET WARM UNDERBOD FULL ACC (MISCELLANEOUS) ×2 IMPLANT
CATH OCTARAY 2.0 F 3-3-3-3-3 (CATHETERS) ×1 IMPLANT
CATH S CIRCA THERM PROBE 10F (CATHETERS) ×1 IMPLANT
CATH SMTCH THERMOCOOL SF DF (CATHETERS) ×1 IMPLANT
CATH SOUNDSTAR ECO 8FR (CATHETERS) ×1 IMPLANT
CATH WEBSTER BI DIR CS D-F CRV (CATHETERS) ×1 IMPLANT
CLOSURE PERCLOSE PROSTYLE (VASCULAR PRODUCTS) ×3 IMPLANT
COVER SWIFTLINK CONNECTOR (BAG) ×2 IMPLANT
KIT VERSACROSS LRG ACCESS (CATHETERS) ×1 IMPLANT
PACK EP LATEX FREE (CUSTOM PROCEDURE TRAY) ×2
PACK EP LF (CUSTOM PROCEDURE TRAY) ×1 IMPLANT
PAD PRO RADIOLUCENT 2001M-C (PAD) ×2 IMPLANT
PATCH CARTO3 (PAD) ×1 IMPLANT
SHEATH CARTO VIZIGO SM CVD (SHEATH) ×1 IMPLANT
SHEATH PINNACLE 7F 10CM (SHEATH) ×1 IMPLANT
SHEATH PINNACLE 8F 10CM (SHEATH) ×1 IMPLANT
SHEATH PINNACLE 9F 10CM (SHEATH) ×1 IMPLANT
SHEATH PROBE COVER 6X72 (BAG) ×1 IMPLANT
TUBING SMART ABLATE COOLFLOW (TUBING) ×1 IMPLANT

## 2020-12-09 NOTE — Progress Notes (Signed)
Pt Pt ambulated without difficulty or bleeding.   Discharged home with daughter who will drive and stay with pt x 24 hrs

## 2020-12-09 NOTE — H&P (Signed)
Electrophysiology Office Follow up Visit Note:    Date:  10/27/2020    ID:  Brandy [name], DOB January 25, 1956, MRN [id number]   PCP:  Glean Hess, MD             [facility] Cardiologist:  None  [facility] Electrophysiologist: Dr. Caryl Comes     Interval History:     Brandy [name] is a 65 y.o. female who presents for a follow up visit. They were last seen in clinic October 20, 2020 for longstanding persistent atrial fibrillation.  At that appointment we scheduled her for a cardioversion which was performed on October 22, 2020 by Dr. Saunders Revel.  The patient successfully cardioverted to normal rhythm with 1 shock.  At the time of our last appointment, we elected to use the cardioversion to help determine whether or not she would stay in rhythm now that her left ventricular function had normalized.  We discussed potentially pursuing ablation if she was able to maintain normal rhythm for any significant period of time.  We also discussed using the cardioversion to determine whether or not she had symptoms attributable to her atrial fibrillation.   Today she presents normal sinus rhythm.  She is feeling stronger now that she is normal rhythm.  For several days after the cardioversion though she was weaker and she attributes that to the sedation/anesthesia.           Past Medical History:  Diagnosis Date   Arthritis     Chicken pox     Chronic systolic CHF (congestive heart failure) ([place])      a. 03/2016 Echo: Ef 15-20%, diff HK, ant AK;  b. 05/2016 Echo: Ef 30-35%, diff HK, mildly dil LA/RA.   Diabetes mellitus without complication (HCC)     Dysrhythmia     GERD (gastroesophageal reflux disease)     Heart murmur     Hip discomfort      been going on for 40 years   History of kidney stones     Hypertension     Moderate mitral regurgitation      a. 03/2016 Echo: mod MR in setting of LV dysfxn.   Motion sickness      car - back seat   NICM (nonischemic cardiomyopathy) ([place])      a. 03/2016 Echo: EF  15-20%, diff HK, ant AK, mod MR, mod dil LA, mildly dil RA;  b. 04/2016 Cath: nl cors;  c. 05/2016 Echo: EF 30-35%, diff HK.   Obesity     Obstructive sleep apnea      compliant with CPAP   Persistent atrial fibrillation ([place])      a. Dx 03/2016;  b. CHA2DS2VASc = 2-->Eliquis '5mg'$  BID;  b. 05/2016 Failed DCCV x 4.   Sleep apnea     Visit for monitoring Tikosyn therapy 07/24/2016           Past Surgical History:  Procedure Laterality Date   BIOPSY N/A 01/12/2020    Procedure: BIOPSY;  Surgeon: Virgel Manifold, MD;  Location: [place];  Service: Endoscopy;  Laterality: N/A;   CARDIAC CATHETERIZATION N/A 04/17/2016    Procedure: Left Heart Cath and Coronary Angiography;  Surgeon: Wellington Hampshire, MD;  Location: [place] [facility];  Service: Cardiovascular;  Laterality: N/A;   CARDIOVERSION N/A 10/22/2020    Procedure: CARDIOVERSION;  Surgeon: Nelva Bush, MD;  Location: [facility];  Service: Cardiovascular;  Laterality: N/A;   CHOLECYSTECTOMY  COLON SURGERY   2021   COLONOSCOPY N/A 01/20/2020    Procedure: COLONOSCOPY;  Surgeon: Lesly Rubenstein, MD;  Location: [facility];  Service: Endoscopy;  Laterality: N/A;   COLONOSCOPY WITH PROPOFOL N/A 01/12/2020    Procedure: COLONOSCOPY WITH PROPOFOL;  Surgeon: Virgel Manifold, MD;  Location: [place];  Service: Endoscopy;  Laterality: N/A;  Diabetic - oral meds sleep apnea   ELECTROPHYSIOLOGIC STUDY N/A 05/26/2016    Procedure: Cardioversion;  Surgeon: Wellington Hampshire, MD;  Location: [facility];  Service: Cardiovascular;  Laterality: N/A;   ESOPHAGOGASTRODUODENOSCOPY (EGD) WITH PROPOFOL N/A 01/12/2020    Procedure: ESOPHAGOGASTRODUODENOSCOPY (EGD) WITH PROPOFOL;  Surgeon: Virgel Manifold, MD;  Location: [place];  Service: Endoscopy;  Laterality: N/A;   POLYPECTOMY N/A 01/12/2020    Procedure: POLYPECTOMY;  Surgeon: Virgel Manifold, MD;  Location: [place];  Service: Endoscopy;   Laterality: N/A;      Current Medications: Active Medications      Current Meds  Medication Sig   Cholecalciferol (VITAMIN D3) 1000 units CAPS Take 1,000 Units by mouth 2 (two) times daily.   ELIQUIS 5 MG TABS tablet Take 1 tablet by mouth twice daily   furosemide (LASIX) 20 MG tablet Take 1 tablet (20 mg total) by mouth daily as needed for fluid or edema.   glucose blood (ONETOUCH VERIO) test strip USE UP TO 4 TIMES DAILY AS DIRECTED   Lancets (ONETOUCH DELICA PLUS [id number]) MISC UP TO 4 TIMES DAILY AS DIRECTED   Lavender Oil OIL Apply 1 application topically daily as needed (stress).   Lemon Oil OIL Take 1 application by mouth daily as needed (add to water).   Lemongrass Oil OIL Apply 1 application topically daily as needed (multiple uses).   Magnesium Bisglycinate (MAG GLYCINATE) 100 MG TABS Take 200 mg by mouth in the morning, at noon, and at bedtime.   metFORMIN (GLUCOPHAGE-XR) 500 MG 24 hr tablet Take 1/2 tablet by mouth twice a day   metoprolol tartrate (LOPRESSOR) 50 MG tablet Take 1 tablet (50 mg total) by mouth 2 (two) times daily.   midodrine (PROAMATINE) 2.5 MG tablet Take 1 tablet (2.5 mg) by mouth three times a day at 6 am, 10 am, & 2 pm   Multiple Vitamins-Minerals (MULTIVITAMIN WITH MINERALS) tablet Take 1 tablet by mouth daily.   Oil of Oregano 1500 MG CAPS Take 1,500 mg by mouth daily as needed (cold symptoms).   peppermint oil liquid Apply 1 application topically daily as needed (itchy/ cold symptoms).   Tea Tree Oil OIL Apply 1 application topically daily as needed (fungus).   vitamin C (ASCORBIC ACID) 500 MG tablet Take 500 mg by mouth daily.         Allergies:   Patient has no known allergies.    Social History         Socioeconomic History   Marital status: Widowed      Spouse name: Deceased   Number of children: 1   Years of education: Not on file   Highest education level: Not on file  Occupational History   Not on file  Tobacco Use   Smoking  status: Never   Smokeless tobacco: Never  Vaping Use   Vaping Use: Never used  Substance and Sexual Activity   Alcohol use: Never   Drug use: Never   Sexual activity: Not Currently      Partners: Male  Other Topics Concern   Not on file  Social  History Narrative    1 by birth 2 adopted    Pt lives alone    Social Determinants of Health       Financial Resource Strain: Low Risk   Difficulty of Paying Living Expenses: Not hard at all  Food Insecurity: No Food Insecurity   Worried About Charity fundraiser in the Last Year: Never true   Arboriculturist in the Last Year: Never true  Transportation Needs: No Transportation Needs   Lack of Transportation (Medical): No   Lack of Transportation (Non-Medical): No  Physical Activity: Insufficiently Active   Days of Exercise per Week: 2 days   Minutes of Exercise per Session: 60 min  Stress: No Stress Concern Present   Feeling of Stress : Only a little  Social Connections: Moderately Integrated   Frequency of Communication with Friends and Family: More than three times a week   Frequency of Social Gatherings with Friends and Family: More than three times a week   Attends Religious Services: More than 4 times per year   Active Member of [employer] or Organizations: Yes   Attends Archivist Meetings: More than 4 times per year   Marital Status: Widowed      Family History: The patient's family history includes Anxiety disorder in her mother; Arthritis in her father and mother; Asthma in her mother; COPD in her father and mother; Cancer in her maternal aunt; Depression in her mother; Diabetes in her paternal aunt; Heart disease in her father; Hypertension in her father; Varicose Veins in her father.   ROS:   Please see the history of present illness.    All other systems reviewed and are negative.   EKGs/Labs/Other Studies Reviewed:     The following studies were reviewed today: Cardioversion records   EKG:  The ekg ordered  today demonstrates sinus rhythm.  Ventricular rate 57 bpm.   Recent Labs: 01/18/2020: ALT 28 09/21/2020: Magnesium 2.3 10/20/2020: BUN 15; Creatinine, Ser 0.77; Hemoglobin 14.6; Platelets 238; Potassium 4.2; Sodium 138  Recent Lipid Panel Labs (Brief)          Component Value Date/Time    CHOL 191 10/31/2016 1457    TRIG 216.0 (H) 10/31/2016 1457    HDL 41.30 10/31/2016 1457    CHOLHDL 5 10/31/2016 1457    VLDL 43.2 (H) 10/31/2016 1457    LDLDIRECT 131.0 10/31/2016 1457        Physical Exam:    VS:  BP 130/72 (BP Location: Left Arm, Patient Position: Sitting, Cuff Size: Normal)   Pulse (!) 57   Ht '5\' 7"'$  (1.702 m)   Wt 209 lb (94.8 kg)   SpO2 98%   BMI 32.73 kg/m         Wt Readings from Last 3 Encounters:  10/27/20 209 lb (94.8 kg)  10/26/20 207 lb 12.8 oz (94.3 kg)  10/22/20 206 lb (93.4 kg)      GEN:  Well nourished, well developed in no acute distress.  Obese HEENT: Normal NECK: No JVD; No carotid bruits LYMPHATICS: No lymphadenopathy CARDIAC: RRR, no murmurs, rubs, gallops RESPIRATORY:  Clear to auscultation without rales, wheezing or rhonchi ABDOMEN: Soft, non-tender, non-distended MUSCULOSKELETAL:  No edema; No deformity SKIN: Warm and dry NEUROLOGIC:  Alert and oriented x 3 PSYCHIATRIC:  Normal affect   ASSESSMENT:     1. Longstanding persistent atrial fibrillation ([place])  2. NICM (nonischemic cardiomyopathy) ([place])  3. Pre-procedure lab exam  4. Persistent atrial fibrillation ([place])  PLAN:    In order of problems listed above:   Persistent atrial fibrillation Patient is maintaining normal rhythm after recent cardioversion.  Given she has held onto normal rhythm for about a week now, I do not think she has permanent atrial fibrillation.  I suspect her 100 normalized left ventricular function is helping her maintain normal rhythm.  For this reason, I do think it is reasonable to pursue a rhythm control strategy.  We discussed the options as we had at our  prior visit and the patient would like to pursue ablation.  I reviewed the ablation procedure, recovery, efficacy and risks.  I discussed the need for a preprocedural CT pulmonary vein protocol to assess her left atrial anatomy.  She would like to proceed with scheduling.  She is maintained on Eliquis for stroke prophylaxis.   Risk, benefits, and alternatives to EP study and radiofrequency ablation for afib were also discussed in detail today. These risks include but are not limited to stroke, bleeding, vascular damage, tamponade, perforation, damage to the esophagus, lungs, and other structures, pulmonary vein stenosis, worsening renal function, and death. The patient understands these risk and wishes to proceed.  We will therefore proceed with catheter ablation at the next available time.  Carto, ICE, anesthesia are requested for the procedure.  Will also obtain CT PV protocol prior to the procedure to exclude LAA thrombus and further evaluate atrial anatomy.   2.  Nonischemic cardiomyopathy Normalized left trickle function with most recent echo showing EF of 55%.  NYHA class II.  A rhythm control strategy is indicated because of her history of decreased ejection fraction.  Ablation as above.  Continue Lasix, metoprolol.    Total time spent with patient today 45 minutes. This includes reviewing records, evaluating the patient and coordinating care.   Medication Adjustments/Labs and Tests Ordered: Current medicines are reviewed at length with the patient today.  Concerns regarding medicines are outlined above.    Orders Placed This Encounter  Procedures   CT CARDIAC MORPH/PULM VEIN W/CM&W/O CA SCORE   Basic metabolic panel   CBC   EKG 12-Lead    No orders of the defined types were placed in this encounter.       Signed, Lars Mage, MD, [facility], [facility] 10/27/2020 6:49 PM    Electrophysiology [facility]  HeartCare   --------------------------------------------------------------------------  I have seen, examined the patient, and reviewed the above assessment and plan.    Plan for PVI today.   Vickie Epley, MD 12/09/2020 7:26 AM

## 2020-12-09 NOTE — Progress Notes (Signed)
Pure-wick removed. Pt did not void but was able to ambulate to the BR and void without difficulty

## 2020-12-09 NOTE — Progress Notes (Signed)
Pt unable to void in bedpan/ Pure-wick applied

## 2020-12-09 NOTE — Discharge Instructions (Addendum)
Post procedure care instructions No driving for 4 days. No lifting over 5 lbs for 1 week. No vigorous or sexual activity for 1 week. You may return to work/your usual activities on 12/17/20. Keep procedure site clean & dry. If you notice increased pain, swelling, bleeding or pus, call/return!  You may shower after 24 hours, but no soaking in baths/hot tubs/pools for 1 week.    You have an appointment set up with the [facility].  Multiple studies have shown that being followed by a dedicated atrial fibrillation clinic in addition to the standard care you receive from your other physicians improves health. We believe that enrollment in the atrial fibrillation clinic will allow [place] to better care for you.   The phone number to the [facility] is [phone]. The clinic is staffed Monday through Friday from 8:30am to 5pm.  Parking Directions: The clinic is located in the Heart and Vascular Building connected to [facility]. 1)From [address] turn on to [address] and go to the 3rd entrance  (Heart and Vascular entrance) on the right. 2)Look to the right for Heart &Vascular Parking Garage. 3)A code for the entrance is require, for August is 5544.   4)Take the elevators to the 1st floor. Registration is in the room with the glass walls at the end of the hallway.  If you have any trouble parking or locating the clinic, please don't hesitate to call [phone].   Cardiac Ablation, Care After  This sheet gives you information about how to care for yourself after your procedure. Your health care provider may also give you more specific instructions. If you have problems or questions, contact your health care provider. What can I expect after the procedure? After the procedure, it is common to have: Bruising around your puncture site. Tenderness around your puncture site. Skipped heartbeats. Tiredness (fatigue).  Follow these instructions at  home: Puncture site care  Follow instructions from your health care provider about how to take care of your puncture site. Make sure you: If present, leave stitches (sutures), skin glue, or adhesive strips in place. These skin closures may need to stay in place for up to 2 weeks. If adhesive strip edges start to loosen and curl up, you may trim the loose edges. Do not remove adhesive strips completely unless your health care provider tells you to do that. If a large square bandage is present, this may be removed 24 hours after surgery.  Check your puncture site every day for signs of infection. Check for: Redness, swelling, or pain. Fluid or blood. If your puncture site starts to bleed, lie down on your back, apply firm pressure to the area, and contact your health care provider. Warmth. Pus or a bad smell. Driving Do not drive for at least 4 days after your procedure or however long your health care provider recommends. (Do not resume driving if you have previously been instructed not to drive for other health reasons.) Do not drive or use heavy machinery while taking prescription pain medicine. Activity Avoid activities that take a lot of effort for at least 7 days after your procedure. Do not lift anything that is heavier than 5 lb (4.5 kg) for one week.  No sexual activity for 1 week.  Return to your normal activities as told by your health care provider. Ask your health care provider what activities are safe for you. General instructions Take over-the-counter and prescription medicines only as told by your health care  provider. Do not use any products that contain nicotine or tobacco, such as cigarettes and e-cigarettes. If you need help quitting, ask your health care provider. You may shower after 24 hours, but Do not take baths, swim, or use a hot tub for 1 week.  Do not drink alcohol for 24 hours after your procedure. Keep all follow-up visits as told by your health care provider. This  is important. Contact a health care provider if: You have redness, mild swelling, or pain around your puncture site. You have fluid or blood coming from your puncture site that stops after applying firm pressure to the area. Your puncture site feels warm to the touch. You have pus or a bad smell coming from your puncture site. You have a fever. You have chest pain or discomfort that spreads to your neck, jaw, or arm. You are sweating a lot. You feel nauseous. You have a fast or irregular heartbeat. You have shortness of breath. You are dizzy or light-headed and feel the need to lie down. You have pain or numbness in the arm or leg closest to your puncture site. Get help right away if: Your puncture site suddenly swells. Your puncture site is bleeding and the bleeding does not stop after applying firm pressure to the area. These symptoms may represent a serious problem that is an emergency. Do not wait to see if the symptoms will go away. Get medical help right away. Call your local emergency services (911 in the [place] Do not drive yourself to the hospital. Summary After the procedure, it is normal to have bruising and tenderness at the puncture site in your groin, neck, or forearm. Check your puncture site every day for signs of infection. Get help right away if your puncture site is bleeding and the bleeding does not stop after applying firm pressure to the area. This is a medical emergency. This information is not intended to replace advice given to you by your health care provider. Make sure you discuss any questions you have with your health care provider.

## 2020-12-09 NOTE — Anesthesia Procedure Notes (Signed)
Procedure Name: Intubation Date/Time: 12/09/2020 7:36 AM Performed by: Rande Brunt, CRNA Pre-anesthesia Checklist: Patient identified, Emergency Drugs available, Suction available and Patient being monitored Patient Re-evaluated:Patient Re-evaluated prior to induction Oxygen Delivery Method: Circle System Utilized Preoxygenation: Pre-oxygenation with 100% oxygen Induction Type: IV induction Ventilation: Mask ventilation without difficulty Laryngoscope Size: Mac and 3 Grade View: Grade I Tube type: Oral Number of attempts: 1 Airway Equipment and Method: Stylet Placement Confirmation: ETT inserted through vocal cords under direct vision, positive ETCO2 and breath sounds checked- equal and bilateral Secured at: 23 cm Tube secured with: Tape Dental Injury: Teeth and Oropharynx as per pre-operative assessment

## 2020-12-09 NOTE — Transfer of Care (Signed)
Immediate Anesthesia Transfer of Care Note  Patient: Brandy [name]  Procedure(s) Performed: ATRIAL FIBRILLATION ABLATION  Patient Location: PACU  Anesthesia Type:General  Level of Consciousness: awake, alert  and patient cooperative  Airway & Oxygen Therapy: Patient Spontanous Breathing and Patient connected to nasal cannula oxygen  Post-op Assessment: Report given to RN, Post -op Vital signs reviewed and stable and Patient moving all extremities  Post vital signs: Reviewed and stable  Last Vitals:  Vitals Value Taken Time  BP 136/40 12/09/20 1027  Temp 36.7 C 12/09/20 1027  Pulse 94 12/09/20 1027  Resp 16 12/09/20 1028  SpO2 93 % 12/09/20 1027  Vitals shown include unvalidated device data.  Last Pain:  Vitals:   12/09/20 1027  TempSrc: Temporal  PainSc: Asleep      Patients Stated Pain Goal: 3 ([id number])  Complications: No notable events documented.

## 2020-12-09 NOTE — Anesthesia Preprocedure Evaluation (Signed)
Anesthesia Evaluation  Patient identified by MRN, date of birth, ID band Patient awake    Reviewed: Allergy & Precautions, H&P , NPO status , Patient's Chart, lab work & pertinent test results  Airway Mallampati: II   Neck ROM: full    Dental   Pulmonary sleep apnea ,    breath sounds clear to auscultation       Cardiovascular hypertension, +CHF  + dysrhythmias Atrial Fibrillation  Rhythm:irregular Rate:Normal  EF 30%   Neuro/Psych    GI/Hepatic GERD  ,  Endo/Other  diabetes, Type 2obese  Renal/GU      Musculoskeletal  (+) Arthritis ,   Abdominal   Peds  Hematology   Anesthesia Other Findings   Reproductive/Obstetrics                             Anesthesia Physical Anesthesia Plan  ASA: 3  Anesthesia Plan: General   Post-op Pain Management:    Induction: Intravenous  PONV Risk Score and Plan: 3 and Ondansetron, Dexamethasone and Treatment may vary due to age or medical condition  Airway Management Planned: Oral ETT  Additional Equipment:   Intra-op Plan:   Post-operative Plan: Extubation in OR  Informed Consent: I have reviewed the patients History and Physical, chart, labs and discussed the procedure including the risks, benefits and alternatives for the proposed anesthesia with the patient or authorized representative who has indicated his/her understanding and acceptance.     Dental advisory given  Plan Discussed with: CRNA, Anesthesiologist and Surgeon  Anesthesia Plan Comments:         Anesthesia Quick Evaluation

## 2020-12-10 NOTE — Anesthesia Postprocedure Evaluation (Signed)
Anesthesia Post Note  Patient: Brandy [name]  Procedure(s) Performed: ATRIAL FIBRILLATION ABLATION     Patient location during evaluation: Cath Lab Anesthesia Type: General Level of consciousness: awake and alert Pain management: pain level controlled Vital Signs Assessment: post-procedure vital signs reviewed and stable Respiratory status: spontaneous breathing, nonlabored ventilation, respiratory function stable and patient connected to nasal cannula oxygen Cardiovascular status: blood pressure returned to baseline and stable Postop Assessment: no apparent nausea or vomiting Anesthetic complications: no   No notable events documented.  Last Vitals:  Vitals:   12/09/20 1400 12/09/20 1440  BP: (!) 133/52 140/60  Pulse: 88 85  Resp: 18 (!) 29  Temp:    SpO2: 96% 94%    Last Pain:  Vitals:   12/09/20 1440  TempSrc:   PainSc: 0-No pain                 Reilly Blades S

## 2020-12-14 ENCOUNTER — Telehealth: Payer: Self-pay | Admitting: Cardiology

## 2020-12-14 NOTE — Telephone Encounter (Signed)
STAT if HR is under 50 or over 120 (normal HR is 60-100 beats per minute)  What is your heart rate? This morning 117  Do you have a log of your heart rate readings (document readings)? Yesterday 145  Do you have any other symptoms? BP this morning 79/67 HR 105. A little dizzy.   Patient had ablation on Thursday 7/28 by Dr. Quentin Ore

## 2020-12-14 NOTE — Telephone Encounter (Signed)
Returned call to Pt.  Pt has had some intermittent elevated heart rates that her Jodelle Red is classifying as "tachycardia".  Pt states she has felt fine since her ablation.  She states there are times where she feels she needs to stop and take a deep breath, as if she was holding her breath.  Pt has previously taken midodrine for low blood pressures, this was recently discontinued.  She will continue to monitor her BP and heart rates and send to this nurse via [place].  She will also send any kardia strips that are concerning.  She knows to call afib clinic if she has chest pain or sob.  Dr. Quentin Ore aware.  Will continue to monitor.

## 2020-12-20 ENCOUNTER — Other Ambulatory Visit: Payer: Self-pay | Admitting: Internal Medicine

## 2020-12-20 NOTE — Telephone Encounter (Signed)
Pt's age 65, wt 94.3 kg, SCr 0.76, CrCl 108.4, last ov w/ SK 11/01/20.

## 2020-12-22 NOTE — H&P (Signed)
Electrophysiology Office Follow up Visit Note:    Date:  10/27/2020    ID:  Brandy [name], DOB [birth year], MRN [id number]   PCP:  Glean Hess, MD             [facility] Cardiologist:  None  [facility] Electrophysiologist: Dr. Caryl Comes     Interval History:     Brandy [name] is a 65 y.o. female who presents for a follow up visit. They were last seen in clinic October 20, 2020 for longstanding persistent atrial fibrillation.  At that appointment we scheduled her for a cardioversion which was performed on October 22, 2020 by Dr. Saunders Revel.  The patient successfully cardioverted to normal rhythm with 1 shock.  At the time of our last appointment, we elected to use the cardioversion to help determine whether or not she would stay in rhythm now that her left ventricular function had normalized.  We discussed potentially pursuing ablation if she was able to maintain normal rhythm for any significant period of time.  We also discussed using the cardioversion to determine whether or not she had symptoms attributable to her atrial fibrillation.   Today she presents normal sinus rhythm.  She is feeling stronger now that she is normal rhythm.  For several days after the cardioversion though she was weaker and she attributes that to the sedation/anesthesia.           Past Medical History:  Diagnosis Date   Arthritis     Chicken pox     Chronic systolic CHF (congestive heart failure) ([place])      a. 03/2016 Echo: Ef 15-20%, diff HK, ant AK;  b. 05/2016 Echo: Ef 30-35%, diff HK, mildly dil LA/RA.   Diabetes mellitus without complication (HCC)     Dysrhythmia     GERD (gastroesophageal reflux disease)     Heart murmur     Hip discomfort      been going on for 40 years   History of kidney stones     Hypertension     Moderate mitral regurgitation      a. 03/2016 Echo: mod MR in setting of LV dysfxn.   Motion sickness      car - back seat   NICM (nonischemic cardiomyopathy) ([place])      a. 03/2016 Echo: EF  15-20%, diff HK, ant AK, mod MR, mod dil LA, mildly dil RA;  b. 04/2016 Cath: nl cors;  c. 05/2016 Echo: EF 30-35%, diff HK.   Obesity     Obstructive sleep apnea      compliant with CPAP   Persistent atrial fibrillation ([place])      a. Dx 03/2016;  b. CHA2DS2VASc = 2-->Eliquis '5mg'$  BID;  b. 05/2016 Failed DCCV x 4.   Sleep apnea     Visit for monitoring Tikosyn therapy 07/24/2016           Past Surgical History:  Procedure Laterality Date   BIOPSY N/A 01/12/2020    Procedure: BIOPSY;  Surgeon: Virgel Manifold, MD;  Location: [facility];  Service: Endoscopy;  Laterality: N/A;   CARDIAC CATHETERIZATION N/A 04/17/2016    Procedure: Left Heart Cath and Coronary Angiography;  Surgeon: Wellington Hampshire, MD;  Location: [facility];  Service: Cardiovascular;  Laterality: N/A;   CARDIOVERSION N/A 10/22/2020    Procedure: CARDIOVERSION;  Surgeon: Nelva Bush, MD;  Location: [facility];  Service: Cardiovascular;  Laterality: N/A;   CHOLECYSTECTOMY  COLON SURGERY   2021   COLONOSCOPY N/A 01/20/2020    Procedure: COLONOSCOPY;  Surgeon: Lesly Rubenstein, MD;  Location: [facility] ENDOSCOPY;  Service: Endoscopy;  Laterality: N/A;   COLONOSCOPY WITH PROPOFOL N/A 01/12/2020    Procedure: COLONOSCOPY WITH PROPOFOL;  Surgeon: Virgel Manifold, MD;  Location: [facility];  Service: Endoscopy;  Laterality: N/A;  Diabetic - oral meds sleep apnea   ELECTROPHYSIOLOGIC STUDY N/A 05/26/2016    Procedure: Cardioversion;  Surgeon: Wellington Hampshire, MD;  Location: [facility];  Service: Cardiovascular;  Laterality: N/A;   ESOPHAGOGASTRODUODENOSCOPY (EGD) WITH PROPOFOL N/A 01/12/2020    Procedure: ESOPHAGOGASTRODUODENOSCOPY (EGD) WITH PROPOFOL;  Surgeon: Virgel Manifold, MD;  Location: [facility];  Service: Endoscopy;  Laterality: N/A;   POLYPECTOMY N/A 01/12/2020    Procedure: POLYPECTOMY;  Surgeon: Virgel Manifold, MD;  Location: [facility];  Service: Endoscopy;   Laterality: N/A;      Current Medications: Active Medications      Current Meds  Medication Sig   Cholecalciferol (VITAMIN D3) 1000 units CAPS Take 1,000 Units by mouth 2 (two) times daily.   ELIQUIS 5 MG TABS tablet Take 1 tablet by mouth twice daily   furosemide (LASIX) 20 MG tablet Take 1 tablet (20 mg total) by mouth daily as needed for fluid or edema.   glucose blood (ONETOUCH VERIO) test strip USE UP TO 4 TIMES DAILY AS DIRECTED   Lancets (ONETOUCH DELICA PLUS 123XX123) MISC UP TO 4 TIMES DAILY AS DIRECTED   Lavender Oil OIL Apply 1 application topically daily as needed (stress).   Lemon Oil OIL Take 1 application by mouth daily as needed (add to water).   Lemongrass Oil OIL Apply 1 application topically daily as needed (multiple uses).   Magnesium Bisglycinate (MAG GLYCINATE) 100 MG TABS Take 200 mg by mouth in the morning, at noon, and at bedtime.   metFORMIN (GLUCOPHAGE-XR) 500 MG 24 hr tablet Take 1/2 tablet by mouth twice a day   metoprolol tartrate (LOPRESSOR) 50 MG tablet Take 1 tablet (50 mg total) by mouth 2 (two) times daily.   midodrine (PROAMATINE) 2.5 MG tablet Take 1 tablet (2.5 mg) by mouth three times a day at 6 am, 10 am, & 2 pm   Multiple Vitamins-Minerals (MULTIVITAMIN WITH MINERALS) tablet Take 1 tablet by mouth daily.   Oil of Oregano 1500 MG CAPS Take 1,500 mg by mouth daily as needed (cold symptoms).   peppermint oil liquid Apply 1 application topically daily as needed (itchy/ cold symptoms).   Tea Tree Oil OIL Apply 1 application topically daily as needed (fungus).   vitamin C (ASCORBIC ACID) 500 MG tablet Take 500 mg by mouth daily.         Allergies:   Patient has no known allergies.    Social History         Socioeconomic History   Marital status: Widowed      Spouse name: Deceased   Number of children: 1   Years of education: Not on file   Highest education level: Not on file  Occupational History   Not on file  Tobacco Use   Smoking  status: Never   Smokeless tobacco: Never  Vaping Use   Vaping Use: Never used  Substance and Sexual Activity   Alcohol use: Never   Drug use: Never   Sexual activity: Not Currently      Partners: Male  Other Topics Concern   Not on file  Social  History Narrative    1 by birth 2 adopted    Pt lives alone    Social Determinants of Health       Financial Resource Strain: Low Risk   Difficulty of Paying Living Expenses: Not hard at all  Food Insecurity: No Food Insecurity   Worried About Charity fundraiser in the Last Year: Never true   Arboriculturist in the Last Year: Never true  Transportation Needs: No Transportation Needs   Lack of Transportation (Medical): No   Lack of Transportation (Non-Medical): No  Physical Activity: Insufficiently Active   Days of Exercise per Week: 2 days   Minutes of Exercise per Session: 60 min  Stress: No Stress Concern Present   Feeling of Stress : Only a little  Social Connections: Moderately Integrated   Frequency of Communication with Friends and Family: More than three times a week   Frequency of Social Gatherings with Friends and Family: More than three times a week   Attends Religious Services: More than 4 times per year   Active Member of [employer] or Organizations: Yes   Attends Archivist Meetings: More than 4 times per year   Marital Status: Widowed      Family History: The patient's family history includes Anxiety disorder in her mother; Arthritis in her father and mother; Asthma in her mother; COPD in her father and mother; Cancer in her maternal aunt; Depression in her mother; Diabetes in her paternal aunt; Heart disease in her father; Hypertension in her father; Varicose Veins in her father.   ROS:   Please see the history of present illness.    All other systems reviewed and are negative.   EKGs/Labs/Other Studies Reviewed:     The following studies were reviewed today: Cardioversion records   EKG:  The ekg ordered  today demonstrates sinus rhythm.  Ventricular rate 57 bpm.   Recent Labs: 01/18/2020: ALT 28 09/21/2020: Magnesium 2.3 10/20/2020: BUN 15; Creatinine, Ser 0.77; Hemoglobin 14.6; Platelets 238; Potassium 4.2; Sodium 138  Recent Lipid Panel Labs (Brief)          Component Value Date/Time    CHOL 191 10/31/2016 1457    TRIG 216.0 (H) 10/31/2016 1457    HDL 41.30 10/31/2016 1457    CHOLHDL 5 10/31/2016 1457    VLDL 43.2 (H) 10/31/2016 1457    LDLDIRECT 131.0 10/31/2016 1457        Physical Exam:    VS:  BP 130/72 (BP Location: Left Arm, Patient Position: Sitting, Cuff Size: Normal)   Pulse (!) 57   Ht '5\' 7"'$  (1.702 m)   Wt 209 lb (94.8 kg)   SpO2 98%   BMI 32.73 kg/m         Wt Readings from Last 3 Encounters:  10/27/20 209 lb (94.8 kg)  10/26/20 207 lb 12.8 oz (94.3 kg)  10/22/20 206 lb (93.4 kg)      GEN:  Well nourished, well developed in no acute distress.  Obese HEENT: Normal NECK: No JVD; No carotid bruits LYMPHATICS: No lymphadenopathy CARDIAC: RRR, no murmurs, rubs, gallops RESPIRATORY:  Clear to auscultation without rales, wheezing or rhonchi ABDOMEN: Soft, non-tender, non-distended MUSCULOSKELETAL:  No edema; No deformity SKIN: Warm and dry NEUROLOGIC:  Alert and oriented x 3 PSYCHIATRIC:  Normal affect   ASSESSMENT:     1. Longstanding persistent atrial fibrillation ([place])  2. NICM (nonischemic cardiomyopathy) ([place])  3. Pre-procedure lab exam  4. Persistent atrial fibrillation ([place])  PLAN:    In order of problems listed above:   Persistent atrial fibrillation Patient is maintaining normal rhythm after recent cardioversion.  Given she has held onto normal rhythm for about a week now, I do not think she has permanent atrial fibrillation.  I suspect her 100 normalized left ventricular function is helping her maintain normal rhythm.  For this reason, I do think it is reasonable to pursue a rhythm control strategy.  We discussed the options as we had at our  prior visit and the patient would like to pursue ablation.  I reviewed the ablation procedure, recovery, efficacy and risks.  I discussed the need for a preprocedural CT pulmonary vein protocol to assess her left atrial anatomy.  She would like to proceed with scheduling.  She is maintained on Eliquis for stroke prophylaxis.   Risk, benefits, and alternatives to EP study and radiofrequency ablation for afib were also discussed in detail today. These risks include but are not limited to stroke, bleeding, vascular damage, tamponade, perforation, damage to the esophagus, lungs, and other structures, pulmonary vein stenosis, worsening renal function, and death. The patient understands these risk and wishes to proceed.  We will therefore proceed with catheter ablation at the next available time.  Carto, ICE, anesthesia are requested for the procedure.  Will also obtain CT PV protocol prior to the procedure to exclude LAA thrombus and further evaluate atrial anatomy.   2.  Nonischemic cardiomyopathy Normalized left trickle function with most recent echo showing EF of 55%.  NYHA class II.  A rhythm control strategy is indicated because of her history of decreased ejection fraction.  Ablation as above.  Continue Lasix, metoprolol.       ---------------------------------------------------  I have seen, examined the patient, and reviewed the above assessment and plan.    Date of service December 09, 2020.  Presents for pulmonary vein isolation procedure.   Vickie Epley, MD 12/22/2020 9:38 PM

## 2021-01-04 ENCOUNTER — Encounter: Payer: Self-pay | Admitting: Pharmacist

## 2021-01-04 NOTE — Progress Notes (Signed)
Dear Dr. Army Melia,  I am a [facility] clinical pharmacist that reviews patients for statin quality initiatives.     Per review of chart and payor information, patient has a diagnosis of diabetes but is not currently filling a statin prescription.  This places patient into the SUPD (Statin Use In Patients with Diabetes) measure for CMS.    I could not find any documentation of previous trial of a statin or a history of statin intolerance.    10-year ASCVD risk:  %  The ASCVD Risk score Mikey Bussing DC Jr., et al., 2013) failed to calculate for the following reasons:   Cannot find a previous HDL lab   Cannot find a previous total cholesterol lab  Please consider the following recommendations:   1) Consider a lipid panel for calculation of ASCVD risk and if applicable a trial of statin therapy. GDMT recommends at least a moderate intensity level statin in patients with diabetes and ASCVD risk > 7.5%.     Examples of moderate intensity statin therapy include the following:  Atorvastatin '10mg'$  once daily #90 3 refills  Rosuvastatin '5mg'$  once daily #90 3 refills  Simvastatin '20mg'$  once daily #90 3 refills  Pravastatin '40mg'$  once daily #90 3 refills  Lovastatin '40mg'$  once daily #90 3 refills  Fluvastatin '40mg'$  BID vs XL '80mg'$  once daily #90 3 refills   ------------------------------------------------------------------------------------   2) If intolerance is a concern, could consider a once, twice or three times weekly regimen as follows:  -Once weekly #13 (90DS) #3 refills  -Twice weekly #26 (90DS) 3 refills  -Three times weekly #39 (90DS) #3 refills   -----------------------------------------------------------------------------------   3) If appropriate, consider adding one the following SUPD exclusion CPT codes.  This is not an all inclusive list.    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  Loretha Brasil, PharmD [place] Clinical Pharmacist Direct Dial: [phone]

## 2021-01-06 ENCOUNTER — Other Ambulatory Visit: Payer: Self-pay

## 2021-01-06 ENCOUNTER — Encounter (HOSPITAL_COMMUNITY): Payer: Self-pay | Admitting: Nurse Practitioner

## 2021-01-06 ENCOUNTER — Ambulatory Visit (HOSPITAL_COMMUNITY)
Admission: RE | Admit: 2021-01-06 | Discharge: 2021-01-06 | Disposition: A | Discharge status: Home / Self Care | Payer: Medicare Other | Source: Ambulatory Visit | Attending: Nurse Practitioner | Admitting: Nurse Practitioner

## 2021-01-06 VITALS — BP 116/70 | HR 79 | Ht 67.0 in | Wt 214.0 lb

## 2021-01-06 DIAGNOSIS — I340 Nonrheumatic mitral (valve) insufficiency: Secondary | ICD-10-CM | POA: Insufficient documentation

## 2021-01-06 DIAGNOSIS — Z7901 Long term (current) use of anticoagulants: Secondary | ICD-10-CM | POA: Insufficient documentation

## 2021-01-06 DIAGNOSIS — I4819 Other persistent atrial fibrillation: Secondary | ICD-10-CM | POA: Diagnosis not present

## 2021-01-06 DIAGNOSIS — D6869 Other thrombophilia: Secondary | ICD-10-CM

## 2021-01-06 DIAGNOSIS — I5022 Chronic systolic (congestive) heart failure: Secondary | ICD-10-CM | POA: Insufficient documentation

## 2021-01-06 DIAGNOSIS — Z79899 Other long term (current) drug therapy: Secondary | ICD-10-CM | POA: Insufficient documentation

## 2021-01-06 DIAGNOSIS — I110 Hypertensive heart disease with heart failure: Secondary | ICD-10-CM | POA: Insufficient documentation

## 2021-01-06 DIAGNOSIS — I482 Chronic atrial fibrillation, unspecified: Secondary | ICD-10-CM

## 2021-01-06 DIAGNOSIS — Z8249 Family history of ischemic heart disease and other diseases of the circulatory system: Secondary | ICD-10-CM | POA: Diagnosis not present

## 2021-01-06 NOTE — Progress Notes (Signed)
Primary Care Physician: Glean Hess, MD Referring Physician:Dr.[name]   Brandy [name] is a 65 y.o. female with a h/o afib that is one month s/p ablation. She is in SR today. Has not noted any afib since ablation. Has some shortness of breath with exertion  since the procedure but is trying to go to the gym on a more regular basis to build up her conditioning. She is having some issues with constipation but is addressing this. No swallowing or groin issues.   Today, she denies symptoms of palpitations, chest pain, shortness of breath, orthopnea, PND, lower extremity edema, dizziness, presyncope, syncope, or neurologic sequela. The patient is tolerating medications without difficulties and is otherwise without complaint today.   Past Medical History:  Diagnosis Date   Acute lower GI bleeding 01/19/2020   Arthritis    Chicken pox    Chronic systolic CHF (congestive heart failure) ([place])    a. 03/2016 Echo: Ef 15-20%, diff HK, ant AK;  b. 05/2016 Echo: Ef 30-35%, diff HK, mildly dil LA/RA.   Diabetes mellitus without complication (HCC)    Dysrhythmia    GERD (gastroesophageal reflux disease)    Heart murmur    Hip discomfort    been going on for 40 years    History of kidney stones    Hypertension    Moderate mitral regurgitation    a. 03/2016 Echo: mod MR in setting of LV dysfxn.   Motion sickness    car - back seat   NICM (nonischemic cardiomyopathy) ([place])    a. 03/2016 Echo: EF 15-20%, diff HK, ant AK, mod MR, mod dil LA, mildly dil RA;  b. 04/2016 Cath: nl cors;  c. 05/2016 Echo: EF 30-35%, diff HK.   Obesity    Obstructive sleep apnea    compliant with CPAP   Persistent atrial fibrillation ([place])    a. Dx 03/2016;  b. CHA2DS2VASc = 2-->Eliquis '5mg'$  BID;  b. 05/2016 Failed DCCV x 4.   Sleep apnea    Visit for monitoring Tikosyn therapy 07/24/2016   Past Surgical History:  Procedure Laterality Date   ATRIAL FIBRILLATION ABLATION N/A 12/09/2020   Procedure: ATRIAL FIBRILLATION  ABLATION;  Surgeon: Vickie Epley, MD;  Location: [place] [facility];  Service: Cardiovascular;  Laterality: N/A;   BIOPSY N/A 01/12/2020   Procedure: BIOPSY;  Surgeon: Virgel Manifold, MD;  Location: [place];  Service: Endoscopy;  Laterality: N/A;   CARDIAC CATHETERIZATION N/A 04/17/2016   Procedure: Left Heart Cath and Coronary Angiography;  Surgeon: Wellington Hampshire, MD;  Location: [place] [facility];  Service: Cardiovascular;  Laterality: N/A;   CARDIOVERSION N/A 10/22/2020   Procedure: CARDIOVERSION;  Surgeon: Nelva Bush, MD;  Location: [place] [facility];  Service: Cardiovascular;  Laterality: N/A;   CHOLECYSTECTOMY     COLON SURGERY  2021   COLONOSCOPY N/A 01/20/2020   Procedure: COLONOSCOPY;  Surgeon: Lesly Rubenstein, MD;  Location: [facility];  Service: Endoscopy;  Laterality: N/A;   COLONOSCOPY WITH PROPOFOL N/A 01/12/2020   Procedure: COLONOSCOPY WITH PROPOFOL;  Surgeon: Virgel Manifold, MD;  Location: [place];  Service: Endoscopy;  Laterality: N/A;  Diabetic - oral meds sleep apnea   ELECTROPHYSIOLOGIC STUDY N/A 05/26/2016   Procedure: Cardioversion;  Surgeon: Wellington Hampshire, MD;  Location: [facility];  Service: Cardiovascular;  Laterality: N/A;   ESOPHAGOGASTRODUODENOSCOPY (EGD) WITH PROPOFOL N/A 01/12/2020   Procedure: ESOPHAGOGASTRODUODENOSCOPY (EGD) WITH PROPOFOL;  Surgeon: Virgel Manifold, MD;  Location: [place]  [facility];  Service: Endoscopy;  Laterality: N/A;   POLYPECTOMY N/A 01/12/2020   Procedure: POLYPECTOMY;  Surgeon: Virgel Manifold, MD;  Location: [facility];  Service: Endoscopy;  Laterality: N/A;    Current Outpatient Medications  Medication Sig Dispense Refill   apixaban (ELIQUIS) 5 MG TABS tablet Take 1 tablet by mouth twice daily 180 tablet 1   Celery Seed OIL Using as needed to help with constipation     Cholecalciferol (VITAMIN D3) 1000 units CAPS Take 1,000 Units by mouth 2 (two) times daily.      furosemide (LASIX) 20 MG tablet Take 1 tablet (20 mg total) by mouth daily as needed for fluid or edema. 30 tablet 0   glucose blood (ONETOUCH VERIO) test strip USE UP TO 4 TIMES DAILY AS DIRECTED 100 each 1   Lancets (ONETOUCH DELICA PLUS [id number]) MISC UP TO 4 TIMES DAILY AS DIRECTED 100 each 1   Lavender Oil OIL Apply 1 application topically daily as needed (stress).     Lemon Oil OIL Take 1 application by mouth daily as needed (add to water).     Lemongrass Oil OIL Apply 1 application topically daily as needed (multiple uses).     Magnesium Bisglycinate (MAG GLYCINATE) 100 MG TABS Take 270 mg by mouth in the morning, at noon, and at bedtime.     metFORMIN (GLUCOPHAGE-XR) 500 MG 24 hr tablet Take 1/2 tablet by mouth twice a day 90 tablet 3   metoprolol tartrate (LOPRESSOR) 50 MG tablet Take 0.5 tablets (25 mg total) by mouth 2 (two) times daily. 90 tablet 3   Multiple Vitamins-Minerals (MULTIVITAMIN WITH MINERALS) tablet Take 1 tablet by mouth daily.     Oil Base OIL Using breathe oil as needed for constipation     OIL OF OREGANO PO Uses as needed     pantoprazole (PROTONIX) 40 MG tablet Take 1 tablet (40 mg total) by mouth daily. 45 tablet 0   peppermint oil liquid Apply 1 application topically daily as needed (itchy/ cold symptoms).     Tea Tree Oil OIL Apply 1 application topically daily as needed (fungus).     vitamin C (ASCORBIC ACID) 500 MG tablet Take 500 mg by mouth daily.      No current facility-administered medications for this encounter.    No Known Allergies  Social History   Socioeconomic History   Marital status: Widowed    Spouse name: Deceased   Number of children: 1   Years of education: Not on file   Highest education level: Not on file  Occupational History   Not on file  Tobacco Use   Smoking status: Never   Smokeless tobacco: Never  Vaping Use   Vaping Use: Never used  Substance and Sexual Activity   Alcohol use: Never   Drug use: Never   Sexual  activity: Not Currently    Partners: Male  Other Topics Concern   Not on file  Social History Narrative   1 by birth 2 adopted   Pt lives alone   Social Determinants of Health   Financial Resource Strain: Low Risk    Difficulty of Paying Living Expenses: Not hard at all  Food Insecurity: No Food Insecurity   Worried About Charity fundraiser in the Last Year: Never true   Arboriculturist in the Last Year: Never true  Transportation Needs: No Transportation Needs   Lack of Transportation (Medical): No   Lack of Transportation (Non-Medical): No  Physical Activity: Insufficiently Active   Days of Exercise per Week: 2 days   Minutes of Exercise per Session: 60 min  Stress: No Stress Concern Present   Feeling of Stress : Only a little  Social Connections: Moderately Integrated   Frequency of Communication with Friends and Family: More than three times a week   Frequency of Social Gatherings with Friends and Family: More than three times a week   Attends Religious Services: More than 4 times per year   Active Member of [employer] or Organizations: Yes   Attends Archivist Meetings: More than 4 times per year   Marital Status: Widowed  Human resources officer Violence: Not At Risk   Fear of Current or Ex-Partner: No   Emotionally Abused: No   Physically Abused: No   Sexually Abused: No    Family History  Problem Relation Age of Onset   COPD Mother    Anxiety disorder Mother    Arthritis Mother    Asthma Mother    Depression Mother    COPD Father    Heart disease Father    Hypertension Father    Arthritis Father    Varicose Veins Father    Diabetes Paternal Aunt    Cancer Maternal Aunt     ROS- All systems are reviewed and negative except as per the HPI above  Physical Exam: Vitals:   01/06/21 1531  BP: 116/70  Pulse: 79  Weight: 97.1 kg  Height: '5\' 7"'$  (1.702 m)   Wt Readings from Last 3 Encounters:  01/06/21 97.1 kg  12/09/20 94.3 kg  11/22/20 93.9 kg     Labs: Lab Results  Component Value Date   NA 140 11/16/2020   K 3.9 11/16/2020   CL 109 11/16/2020   CO2 27 11/16/2020   GLUCOSE 89 11/16/2020   BUN 15 11/16/2020   CREATININE 0.76 11/16/2020   CALCIUM 9.4 11/16/2020   MG 2.3 09/21/2020   Lab Results  Component Value Date   INR 1.0 05/16/2016   Lab Results  Component Value Date   CHOL 191 10/31/2016   HDL 41.30 10/31/2016   TRIG 216.0 (H) 10/31/2016     GEN- The patient is well appearing, alert and oriented x 3 today.   Head- normocephalic, atraumatic Eyes-  Sclera clear, conjunctiva pink Ears- hearing intact Oropharynx- clear Neck- supple, no JVP Lymph- no cervical lymphadenopathy Lungs- Clear to ausculation bilaterally, normal work of breathing Heart- Regular rate and rhythm, no murmurs, rubs or gallops, PMI not laterally displaced GI- soft, NT, ND, + BS Extremities- no clubbing, cyanosis, or edema MS- no significant deformity or atrophy Skin- no rash or lesion Psych- euthymic mood, full affect Neuro- strength and sensation are intact  EKG-NSR at 79 bpm, pr int 182 ms, qrs int 74 ms, qtc 435 ms    Assessment and Plan:  1. Afib S/p ablation one month ago  Maintaining SR  Has not noted any afib  2. CHA2DS2VASc  score of at least 4 Continue eliquis 5 mg bid   Do not interrupt anticoagulation   F/u with Dr. Quentin Ore  03/16/21  Geroge Baseman. Natale Barba, [facility] [address] [place], [place] [address] [phone]

## 2021-02-10 DIAGNOSIS — E119 Type 2 diabetes mellitus without complications: Secondary | ICD-10-CM | POA: Diagnosis not present

## 2021-02-10 LAB — HM DIABETES EYE EXAM

## 2021-02-22 ENCOUNTER — Encounter: Payer: Self-pay | Admitting: Internal Medicine

## 2021-02-22 ENCOUNTER — Ambulatory Visit (INDEPENDENT_AMBULATORY_CARE_PROVIDER_SITE_OTHER): Payer: Medicare Other | Admitting: Internal Medicine

## 2021-02-22 ENCOUNTER — Other Ambulatory Visit: Payer: Self-pay

## 2021-02-22 VITALS — BP 116/64 | HR 83 | Temp 98.8°F | Ht 67.0 in | Wt 213.0 lb

## 2021-02-22 DIAGNOSIS — I428 Other cardiomyopathies: Secondary | ICD-10-CM

## 2021-02-22 DIAGNOSIS — Z23 Encounter for immunization: Secondary | ICD-10-CM

## 2021-02-22 DIAGNOSIS — E118 Type 2 diabetes mellitus with unspecified complications: Secondary | ICD-10-CM | POA: Diagnosis not present

## 2021-02-22 DIAGNOSIS — N3000 Acute cystitis without hematuria: Secondary | ICD-10-CM | POA: Diagnosis not present

## 2021-02-22 LAB — POC URINALYSIS WITH MICROSCOPIC (NON AUTO)MANUAL RESULT
Bilirubin, UA: NEGATIVE
Epithelial cells, urine per micros: 0
Glucose, UA: NEGATIVE
Ketones, UA: NEGATIVE
Leukocytes, UA: NEGATIVE
Mucus, UA: 0
Nitrite, UA: NEGATIVE
Protein, UA: NEGATIVE
RBC: 0 M/uL — AB (ref 4.04–5.48)
Spec Grav, UA: >=1.03 — AB (ref 1.010–1.025)
Urobilinogen, UA: 0.2 E.U./dL
WBC Casts, UA: 4
pH, UA: 5 (ref 5.0–8.0)

## 2021-02-22 MED ORDER — NITROFURANTOIN MONOHYD MACRO 100 MG PO CAPS: 100.0000 mg | ORAL_CAPSULE | Freq: Two times a day (BID) | ORAL | 0 refills | Status: AC

## 2021-02-22 NOTE — Progress Notes (Signed)
Date:  02/22/2021   Name:  Brandy [name]   DOB:  Oct 03, 1955   MRN:  [id number]   Chief Complaint: Urinary Tract Infection (Yesterday started with urgency but little output. Burning, and slight blood in urine. No discharge, no fever, but feels malaise. ) and Diabetes  Urinary Tract Infection  This is a new problem. The current episode started yesterday. The problem occurs intermittently. The problem has been waxing and waning. The quality of the pain is described as burning. The pain is moderate. There has been no fever. She is Not sexually active. There is No history of pyelonephritis. Associated symptoms include frequency, hematuria (none today) and urgency. Pertinent negatives include no chills, discharge, nausea or vomiting. She has tried nothing for the symptoms.  Diabetes She presents for her follow-up diabetic visit. She has type 2 diabetes mellitus. Her disease course has been stable. Pertinent negatives for diabetes include no chest pain and no fatigue. Current diabetic treatment includes oral agent (monotherapy) (on metformin but should consider change to [place]).  Nonischemic CM - on beta blocker, lasix and Eliquis.Cardiology would like her to be on an SGLT-2. We will discuss at next visit after A1C.  Lab Results  Component Value Date   CREATININE 0.76 11/16/2020   BUN 15 11/16/2020   NA 140 11/16/2020   K 3.9 11/16/2020   CL 109 11/16/2020   CO2 27 11/16/2020   Lab Results  Component Value Date   CHOL 191 10/31/2016   HDL 41.30 10/31/2016   LDLDIRECT 131.0 10/31/2016   TRIG 216.0 (H) 10/31/2016   CHOLHDL 5 10/31/2016   Lab Results  Component Value Date   TSH 1.880 10/20/2019   Lab Results  Component Value Date   HGBA1C 5.3 11/22/2020   Lab Results  Component Value Date   WBC 6.5 11/16/2020   HGB 14.6 11/16/2020   HCT 42.7 11/16/2020   MCV 92.4 11/16/2020   PLT 267 11/16/2020   Lab Results  Component Value Date   ALT 28 01/18/2020   AST 27 01/18/2020    ALKPHOS 72 01/18/2020   BILITOT 0.7 01/18/2020     Review of Systems  Constitutional:  Negative for chills, fatigue and fever.  Respiratory:  Positive for shortness of breath. Negative for chest tightness and wheezing.   Cardiovascular:  Positive for palpitations. Negative for chest pain.  Gastrointestinal:  Negative for nausea and vomiting.  Genitourinary:  Positive for frequency, hematuria (none today) and urgency.   Patient Active Problem List   Diagnosis Date Noted   Varicose veins of leg with pain, bilateral 07/06/2020   Acquired thrombophilia ([place]) 05/24/2020   Atrial fibrillation, chronic ([place]) 01/19/2020   Type II diabetes mellitus with complication ([place]) 29/51/8841   Gastric polyp    Stomach irritation    Special screening for malignant neoplasms, colon    Colon polyps    Varicose veins of both lower extremities 10/20/2019   Dysphagia 10/20/2019   Osteoarthritis of right hand 10/31/2016   Longstanding persistent atrial fibrillation ([place])    Atrial fibrillation with rapid ventricular response ([place])    Nonischemic cardiomyopathy ([place]) 04/14/2016   BMI 33.0-33.9,adult 04/14/2016    No Known Allergies  Past Surgical History:  Procedure Laterality Date   ATRIAL FIBRILLATION ABLATION N/A 12/09/2020   Procedure: ATRIAL FIBRILLATION ABLATION;  Surgeon: Vickie Epley, MD;  Location: [place] [facility];  Service: Cardiovascular;  Laterality: N/A;   BIOPSY N/A 01/12/2020   Procedure: BIOPSY;  Surgeon: Virgel Manifold,  MD;  Location: [place];  Service: Endoscopy;  Laterality: N/A;   CARDIAC CATHETERIZATION N/A 04/17/2016   Procedure: Left Heart Cath and Coronary Angiography;  Surgeon: Wellington Hampshire, MD;  Location: [place] [facility];  Service: Cardiovascular;  Laterality: N/A;   CARDIOVERSION N/A 10/22/2020   Procedure: CARDIOVERSION;  Surgeon: Nelva Bush, MD;  Location: [place] [facility];  Service: Cardiovascular;  Laterality: N/A;   CHOLECYSTECTOMY      COLON SURGERY  2021   COLONOSCOPY N/A 01/20/2020   Procedure: COLONOSCOPY;  Surgeon: Lesly Rubenstein, MD;  Location: [facility];  Service: Endoscopy;  Laterality: N/A;   COLONOSCOPY WITH PROPOFOL N/A 01/12/2020   Procedure: COLONOSCOPY WITH PROPOFOL;  Surgeon: Virgel Manifold, MD;  Location: [place];  Service: Endoscopy;  Laterality: N/A;  Diabetic - oral meds sleep apnea   ELECTROPHYSIOLOGIC STUDY N/A 05/26/2016   Procedure: Cardioversion;  Surgeon: Wellington Hampshire, MD;  Location: [facility];  Service: Cardiovascular;  Laterality: N/A;   ESOPHAGOGASTRODUODENOSCOPY (EGD) WITH PROPOFOL N/A 01/12/2020   Procedure: ESOPHAGOGASTRODUODENOSCOPY (EGD) WITH PROPOFOL;  Surgeon: Virgel Manifold, MD;  Location: [place];  Service: Endoscopy;  Laterality: N/A;   POLYPECTOMY N/A 01/12/2020   Procedure: POLYPECTOMY;  Surgeon: Virgel Manifold, MD;  Location: [place];  Service: Endoscopy;  Laterality: N/A;    Social History   Tobacco Use   Smoking status: Never   Smokeless tobacco: Never  Vaping Use   Vaping Use: Never used  Substance Use Topics   Alcohol use: Never   Drug use: Never     Medication list has been reviewed and updated.  Current Meds  Medication Sig   apixaban (ELIQUIS) 5 MG TABS tablet Take 1 tablet by mouth twice daily   Celery Seed OIL Using as needed to help with constipation   Cholecalciferol (VITAMIN D3) 1000 units CAPS Take 1,000 Units by mouth 2 (two) times daily.   furosemide (LASIX) 20 MG tablet Take 1 tablet (20 mg total) by mouth daily as needed for fluid or edema.   glucose blood (ONETOUCH VERIO) test strip USE UP TO 4 TIMES DAILY AS DIRECTED   Lancets (ONETOUCH DELICA PLUS [id number]) MISC UP TO 4 TIMES DAILY AS DIRECTED   Lavender Oil OIL Apply 1 application topically daily as needed (stress).   Lemon Oil OIL Take 1 application by mouth daily as needed (add to water).   Lemongrass Oil OIL Apply 1 application  topically daily as needed (multiple uses).   metFORMIN (GLUCOPHAGE-XR) 500 MG 24 hr tablet Take 1/2 tablet by mouth twice a day   metoprolol tartrate (LOPRESSOR) 50 MG tablet Take 0.5 tablets (25 mg total) by mouth 2 (two) times daily.   Multiple Vitamins-Minerals (MULTIVITAMIN WITH MINERALS) tablet Take 1 tablet by mouth daily.   Oil Base OIL Using breathe oil as needed for constipation   OIL OF OREGANO PO Uses as needed   peppermint oil liquid Apply 1 application topically daily as needed (itchy/ cold symptoms).   Tea Tree Oil OIL Apply 1 application topically daily as needed (fungus).   vitamin C (ASCORBIC ACID) 500 MG tablet Take 500 mg by mouth daily.     PHQ 2/9 Scores 02/22/2021 11/22/2020 09/22/2020 08/02/2020  PHQ - 2 Score 2 1 2  0  PHQ- 9 Score 7 2 3 4     GAD 7 : Generalized Anxiety Score 02/22/2021 11/22/2020 08/02/2020 05/24/2020  Nervous, Anxious, on Edge 1 1 0 0  Control/stop worrying 1 0 0 0  Worry too much - different things 1 0 0 0  Trouble relaxing 1 0 0 0  Restless 1 0 0 0  Easily annoyed or irritable 0 0 0 0  Afraid - awful might happen 1 0 0 0  Total GAD 7 Score 6 1 0 0  Anxiety Difficulty Not difficult at all Not difficult at all - Not difficult at all    BP Readings from Last 3 Encounters:  02/22/21 116/64  01/06/21 116/70  12/09/20 140/60    Physical Exam Vitals and nursing note reviewed.  Constitutional:      General: She is not in acute distress.    Appearance: Normal appearance. She is well-developed.  HENT:     Head: Normocephalic and atraumatic.  Cardiovascular:     Rate and Rhythm: Normal rate and regular rhythm.  Pulmonary:     Effort: Pulmonary effort is normal. No respiratory distress.     Breath sounds: No wheezing or rhonchi.  Abdominal:     Tenderness: There is no abdominal tenderness. There is no right CVA tenderness or left CVA tenderness.  Musculoskeletal:     Right lower leg: No edema.     Left lower leg: No edema.  Skin:     General: Skin is warm and dry.     Findings: No rash.  Neurological:     Mental Status: She is alert and oriented to person, place, and time.  Psychiatric:        Mood and Affect: Mood normal.        Behavior: Behavior normal.    Wt Readings from Last 3 Encounters:  02/22/21 213 lb (96.6 kg)  01/06/21 214 lb (97.1 kg)  12/09/20 208 lb (94.3 kg)    BP 116/64   Pulse 83   Temp 98.8 F (37.1 C) (Oral)   Ht 5\' 7"  (1.702 m)   Wt 213 lb (96.6 kg)   SpO2 94%   BMI 33.36 kg/m   Assessment and Plan: 1. Acute cystitis without hematuria Continue to push fluids Follow up if no improvement - POC urinalysis w microscopic (non auto) - nitrofurantoin, macrocrystal-monohydrate, (MACROBID) 100 MG capsule; Take 1 capsule (100 mg total) by mouth 2 (two) times daily for 5 days.  Dispense: 10 capsule; Refill: 0  2. Nonischemic cardiomyopathy ([place]) Followed by cardiology SGLT-2 therapy is recommended  3. Type II diabetes mellitus with complication (HCC) Continue metformin for now Will check A1C next visit and discuss change in therapy  4. Need for vaccination for pneumococcus - Pneumococcal conjugate vaccine 20-valent   Partially dictated using Editor, commissioning. Any errors are unintentional.  Halina Maidens, MD [facility]  02/22/2021

## 2021-03-15 NOTE — Progress Notes (Signed)
Electrophysiology Office Follow up Visit Note:    Date:  03/16/2021   ID:  Brandy [name], DOB 02-21-1956, MRN [id number]  PCP:  Glean Hess, MD  [facility] Cardiologist:  None  [facility] Electrophysiologist:  Vickie Epley, MD    Interval History:    Brandy [name] is a 65 y.o. female who presents for a follow up visit. She underwent a successful AF ablation on 12/09/2020 during which the PV and posterior wall were isolated. I also performed a CFAE ablation.  She saw Brandy [name] on 01/06/2021 and was maintaining sinus rhythm. She has done well since the ablation without recurrence of atrial fibrillation.  She continues to take Eliquis for stroke prevention without bleeding issues.  She brings her apple watch today and we reviewed the tracings.    Past Medical History:  Diagnosis Date   Acute lower GI bleeding 01/19/2020   Arthritis    Chicken pox    Chronic systolic CHF (congestive heart failure) ([place])    a. 03/2016 Echo: Ef 15-20%, diff HK, ant AK;  b. 05/2016 Echo: Ef 30-35%, diff HK, mildly dil LA/RA.   Diabetes mellitus without complication (HCC)    Dysrhythmia    GERD (gastroesophageal reflux disease)    Heart murmur    Hip discomfort    been going on for 40 years    History of kidney stones    Hypertension    Moderate mitral regurgitation    a. 03/2016 Echo: mod MR in setting of LV dysfxn.   Motion sickness    car - back seat   NICM (nonischemic cardiomyopathy) ([place])    a. 03/2016 Echo: EF 15-20%, diff HK, ant AK, mod MR, mod dil LA, mildly dil RA;  b. 04/2016 Cath: nl cors;  c. 05/2016 Echo: EF 30-35%, diff HK.   Obesity    Obstructive sleep apnea    compliant with CPAP   Persistent atrial fibrillation ([place])    a. Dx 03/2016;  b. CHA2DS2VASc = 2-->Eliquis 5mg  BID;  b. 05/2016 Failed DCCV x 4.   Sleep apnea    Visit for monitoring Tikosyn therapy 07/24/2016    Past Surgical History:  Procedure Laterality Date   ATRIAL FIBRILLATION ABLATION N/A 12/09/2020    Procedure: ATRIAL FIBRILLATION ABLATION;  Surgeon: Vickie Epley, MD;  Location: [place] [facility];  Service: Cardiovascular;  Laterality: N/A;   BIOPSY N/A 01/12/2020   Procedure: BIOPSY;  Surgeon: Virgel Manifold, MD;  Location: [place];  Service: Endoscopy;  Laterality: N/A;   CARDIAC CATHETERIZATION N/A 04/17/2016   Procedure: Left Heart Cath and Coronary Angiography;  Surgeon: Wellington Hampshire, MD;  Location: [place] [facility];  Service: Cardiovascular;  Laterality: N/A;   CARDIOVERSION N/A 10/22/2020   Procedure: CARDIOVERSION;  Surgeon: Nelva Bush, MD;  Location: [place] [facility];  Service: Cardiovascular;  Laterality: N/A;   CHOLECYSTECTOMY     COLON SURGERY  2021   COLONOSCOPY N/A 01/20/2020   Procedure: COLONOSCOPY;  Surgeon: Lesly Rubenstein, MD;  Location: [facility];  Service: Endoscopy;  Laterality: N/A;   COLONOSCOPY WITH PROPOFOL N/A 01/12/2020   Procedure: COLONOSCOPY WITH PROPOFOL;  Surgeon: Virgel Manifold, MD;  Location: [place];  Service: Endoscopy;  Laterality: N/A;  Diabetic - oral meds sleep apnea   ELECTROPHYSIOLOGIC STUDY N/A 05/26/2016   Procedure: Cardioversion;  Surgeon: Wellington Hampshire, MD;  Location: [facility];  Service: Cardiovascular;  Laterality: N/A;   ESOPHAGOGASTRODUODENOSCOPY (EGD) WITH PROPOFOL N/A 01/12/2020  Procedure: ESOPHAGOGASTRODUODENOSCOPY (EGD) WITH PROPOFOL;  Surgeon: Virgel Manifold, MD;  Location: [place];  Service: Endoscopy;  Laterality: N/A;   POLYPECTOMY N/A 01/12/2020   Procedure: POLYPECTOMY;  Surgeon: Virgel Manifold, MD;  Location: [place];  Service: Endoscopy;  Laterality: N/A;    Current Medications: Current Meds  Medication Sig   apixaban (ELIQUIS) 5 MG TABS tablet Take 1 tablet by mouth twice daily   Celery Seed OIL Using as needed to help with constipation   Cholecalciferol (VITAMIN D3) 1000 units CAPS Take 1,000 Units by mouth 2 (two) times daily.    furosemide (LASIX) 20 MG tablet Take 1 tablet (20 mg total) by mouth daily as needed for fluid or edema.   glucose blood (ONETOUCH VERIO) test strip USE UP TO 4 TIMES DAILY AS DIRECTED   Lancets (ONETOUCH DELICA PLUS [id number]) MISC UP TO 4 TIMES DAILY AS DIRECTED   Lavender Oil OIL Apply 1 application topically daily as needed (stress).   Lemon Oil OIL Take 1 application by mouth daily as needed (add to water).   Lemongrass Oil OIL Apply 1 application topically daily as needed (multiple uses).   Magnesium Bisglycinate (MAG GLYCINATE) 100 MG TABS Take 270 mg by mouth in the morning, at noon, and at bedtime.   metFORMIN (GLUCOPHAGE-XR) 500 MG 24 hr tablet Take 1/2 tablet by mouth twice a day   metoprolol tartrate (LOPRESSOR) 50 MG tablet Take 0.5 tablets (25 mg total) by mouth 2 (two) times daily.   Multiple Vitamins-Minerals (MULTIVITAMIN WITH MINERALS) tablet Take 1 tablet by mouth daily.   Oil Base OIL Using breathe oil as needed for congestion   OIL OF OREGANO PO Uses as needed   peppermint oil liquid Apply 1 application topically daily as needed (itchy/ cold symptoms).   Tea Tree Oil OIL Apply 1 application topically daily as needed (fungus).   vitamin C (ASCORBIC ACID) 500 MG tablet Take 500 mg by mouth daily.      Allergies:   Patient has no known allergies.   Social History   Socioeconomic History   Marital status: Widowed    Spouse name: Deceased   Number of children: 1   Years of education: Not on file   Highest education level: Not on file  Occupational History   Not on file  Tobacco Use   Smoking status: Never   Smokeless tobacco: Never  Vaping Use   Vaping Use: Never used  Substance and Sexual Activity   Alcohol use: Never   Drug use: Never   Sexual activity: Not Currently    Partners: Male  Other Topics Concern   Not on file  Social History Narrative   1 by birth 2 adopted   Pt lives alone   Social Determinants of Health   Financial Resource Strain: Low  Risk    Difficulty of Paying Living Expenses: Not hard at all  Food Insecurity: No Food Insecurity   Worried About Charity fundraiser in the Last Year: Never true   Arboriculturist in the Last Year: Never true  Transportation Needs: No Transportation Needs   Lack of Transportation (Medical): No   Lack of Transportation (Non-Medical): No  Physical Activity: Insufficiently Active   Days of Exercise per Week: 2 days   Minutes of Exercise per Session: 60 min  Stress: No Stress Concern Present   Feeling of Stress : Only a little  Social Connections: Moderately Integrated   Frequency of Communication with Friends  and Family: More than three times a week   Frequency of Social Gatherings with Friends and Family: More than three times a week   Attends Religious Services: More than 4 times per year   Active Member of Clubs or Organizations: Yes   Attends Archivist Meetings: More than 4 times per year   Marital Status: Widowed     Family History: The patient's family history includes Anxiety disorder in her mother; Arthritis in her father and mother; Asthma in her mother; COPD in her father and mother; Cancer in her maternal aunt; Depression in her mother; Diabetes in her paternal aunt; Heart disease in her father; Hypertension in her father; Varicose Veins in her father.  ROS:   Please see the history of present illness.    All other systems reviewed and are negative.  EKGs/Labs/Other Studies Reviewed:    The following studies were reviewed today:  Apple watch tracings reviewed  EKG:  The ekg ordered today demonstrates sinus rhythm  Recent Labs: 09/21/2020: Magnesium 2.3 11/16/2020: BUN 15; Creatinine, Ser 0.76; Hemoglobin 14.6; Platelets 267; Potassium 3.9; Sodium 140  Recent Lipid Panel    Component Value Date/Time   CHOL 191 10/31/2016 1457   TRIG 216.0 (H) 10/31/2016 1457   HDL 41.30 10/31/2016 1457   CHOLHDL 5 10/31/2016 1457   VLDL 43.2 (H) 10/31/2016 1457    LDLDIRECT 131.0 10/31/2016 1457    Physical Exam:    VS:  BP 128/80 (BP Location: Left Arm, Patient Position: Sitting, Cuff Size: Normal)   Pulse 67   Ht 5\' 7"  (1.702 m)   Wt 217 lb (98.4 kg)   SpO2 98%   BMI 33.99 kg/m     Wt Readings from Last 3 Encounters:  03/16/21 217 lb (98.4 kg)  02/22/21 213 lb (96.6 kg)  01/06/21 214 lb (97.1 kg)     GEN:  Well nourished, well developed in no acute distress HEENT: Normal NECK: No JVD; No carotid bruits LYMPHATICS: No lymphadenopathy CARDIAC: RRR, no murmurs, rubs, gallops RESPIRATORY:  Clear to auscultation without rales, wheezing or rhonchi  ABDOMEN: Soft, non-tender, non-distended MUSCULOSKELETAL:  No edema; No deformity  SKIN: Warm and dry NEUROLOGIC:  Alert and oriented x 3 PSYCHIATRIC:  Normal affect        ASSESSMENT:    1. Persistent atrial fibrillation ([place])   2. Chronic systolic CHF (congestive heart failure) ([place])   3. NICM (nonischemic cardiomyopathy) ([place])    PLAN:    In order of problems listed above:   #Persistent atrial fibrillation Doing well after her A. fib ablation.  Maintaining sinus rhythm.  Continue Eliquis 5 mg by mouth twice daily indefinitely.  #Chronic systolic heart failure NYHA class II.  Warm and dry on exam.  Continue Lasix, metoprolol.  Rhythm control is indicated.  Follow-up 9 months with APP.  Echo to be performed around that time.    Medication Adjustments/Labs and Tests Ordered: Current medicines are reviewed at length with the patient today.  Concerns regarding medicines are outlined above.  Orders Placed This Encounter  Procedures   EKG 12-Lead    No orders of the defined types were placed in this encounter.    Signed, Lars Mage, MD, [facility], [facility] 03/16/2021 10:34 AM    Electrophysiology [facility]

## 2021-03-16 ENCOUNTER — Encounter: Payer: Self-pay | Admitting: Cardiology

## 2021-03-16 ENCOUNTER — Other Ambulatory Visit: Payer: Self-pay

## 2021-03-16 ENCOUNTER — Ambulatory Visit: Payer: Medicare Other | Admitting: Cardiology

## 2021-03-16 VITALS — BP 128/80 | HR 67 | Ht 67.0 in | Wt 217.0 lb

## 2021-03-16 DIAGNOSIS — I4819 Other persistent atrial fibrillation: Secondary | ICD-10-CM | POA: Diagnosis not present

## 2021-03-16 DIAGNOSIS — I5022 Chronic systolic (congestive) heart failure: Secondary | ICD-10-CM

## 2021-03-16 DIAGNOSIS — I428 Other cardiomyopathies: Secondary | ICD-10-CM

## 2021-03-16 NOTE — Patient Instructions (Addendum)
Medication Instructions:  Your physician recommends that you continue on your current medications as directed. Please refer to the Current Medication list given to you today. *If you need a refill on your cardiac medications before your next appointment, please call your pharmacy*  Lab Work: None ordered. If you have labs (blood work) drawn today and your tests are completely normal, you will receive your results only by: [place] (if you have MyChart) OR A paper copy in the mail If you have any lab test that is abnormal or we need to change your treatment, we will call you to review the results.  Testing/Procedures: None ordered.  Follow-Up: At [facility], you and your health needs are our priority.  As part of our continuing mission to provide you with exceptional heart care, we have created designated Provider Care Teams.  These Care Teams include your primary Cardiologist (physician) and Advanced Practice Providers (APPs -  Physician Assistants and Nurse Practitioners) who all work together to provide you with the care you need, when you need it.  Your next appointment:   Your physician wants you to follow-up in: 9 months with one of the following Advanced Practice Providers on your designated Care Team:   Murray Hodgkins, NP Christell Faith, PA-C Marrianne Mood, PA-C Cadence Kathlen Mody, PA-C You will receive a reminder letter in the mail two months in advance. If you don't receive a letter, please call our office to schedule the follow-up appointment.

## 2021-04-01 ENCOUNTER — Other Ambulatory Visit: Payer: Self-pay

## 2021-04-01 ENCOUNTER — Encounter: Payer: Self-pay | Admitting: Internal Medicine

## 2021-04-01 ENCOUNTER — Ambulatory Visit (INDEPENDENT_AMBULATORY_CARE_PROVIDER_SITE_OTHER): Payer: Medicare Other | Admitting: Internal Medicine

## 2021-04-01 VITALS — BP 124/68 | HR 98 | Ht 67.0 in | Wt 216.8 lb

## 2021-04-01 DIAGNOSIS — M720 Palmar fascial fibromatosis [Dupuytren]: Secondary | ICD-10-CM

## 2021-04-01 DIAGNOSIS — I482 Chronic atrial fibrillation, unspecified: Secondary | ICD-10-CM | POA: Diagnosis not present

## 2021-04-01 DIAGNOSIS — E118 Type 2 diabetes mellitus with unspecified complications: Secondary | ICD-10-CM | POA: Diagnosis not present

## 2021-04-01 DIAGNOSIS — D126 Benign neoplasm of colon, unspecified: Secondary | ICD-10-CM | POA: Diagnosis not present

## 2021-04-01 DIAGNOSIS — Z23 Encounter for immunization: Secondary | ICD-10-CM

## 2021-04-01 DIAGNOSIS — Z13820 Encounter for screening for osteoporosis: Secondary | ICD-10-CM

## 2021-04-01 DIAGNOSIS — Z1231 Encounter for screening mammogram for malignant neoplasm of breast: Secondary | ICD-10-CM

## 2021-04-01 MED ORDER — SHINGRIX 50 MCG/0.5ML IM SUSR: 0.5000 mL | Freq: Once | INTRAMUSCULAR | 1 refills | Status: AC

## 2021-04-01 MED ORDER — METFORMIN HCL ER 500 MG PO TB24: ORAL_TABLET | ORAL | 3 refills | Status: DC

## 2021-04-01 NOTE — Progress Notes (Addendum)
Date:  04/01/2021   Name:  Brandy [name]   DOB:  [birth year]   MRN:  [id number]   Chief Complaint: Diabetes (BS this morning was 107.)  Diabetes She presents for her follow-up diabetic visit. She has type 2 diabetes mellitus. Her disease course has been stable. Pertinent negatives for hypoglycemia include no dizziness, headaches, nervousness/anxiousness or tremors. Pertinent negatives for diabetes include no chest pain, no fatigue, no polydipsia, no polyuria and no weakness. Current diabetic treatment includes oral agent (monotherapy). She is compliant with treatment all of the time. She is following a diabetic diet. Her breakfast blood glucose is taken between 7-8 am. Her breakfast blood glucose range is generally 110-130 mg/dl. An ACE inhibitor/angiotensin II receptor blocker is not being taken.  Chronic Afib - followed closely by cardiology.  On beta blocker and Eliquis.  No bleeding issues. Has been in [place] since a recent ablation.  Mild DOE which is chronic.  She would like a renewal on her handicapped parking. Hand pain - has some contractures of both palms, mild pain and unable to fully grip.  No previous evaluation or treatment.  Lab Results  Component Value Date   CREATININE 0.76 11/16/2020   BUN 15 11/16/2020   NA 140 11/16/2020   K 3.9 11/16/2020   CL 109 11/16/2020   CO2 27 11/16/2020   Lab Results  Component Value Date   CHOL 191 10/31/2016   HDL 41.30 10/31/2016   LDLDIRECT 131.0 10/31/2016   TRIG 216.0 (H) 10/31/2016   CHOLHDL 5 10/31/2016   Lab Results  Component Value Date   TSH 1.880 10/20/2019   Lab Results  Component Value Date   HGBA1C 5.3 11/22/2020   Lab Results  Component Value Date   WBC 6.5 11/16/2020   HGB 14.6 11/16/2020   HCT 42.7 11/16/2020   MCV 92.4 11/16/2020   PLT 267 11/16/2020   Lab Results  Component Value Date   ALT 28 01/18/2020   AST 27 01/18/2020   ALKPHOS 72 01/18/2020   BILITOT 0.7 01/18/2020   No components found for:  VITD  Review of Systems  Constitutional:  Negative for appetite change, fatigue, fever and unexpected weight change.  HENT:  Negative for nosebleeds, tinnitus and trouble swallowing.   Eyes:  Negative for visual disturbance.  Respiratory:  Positive for shortness of breath. Negative for cough, chest tightness and wheezing.   Cardiovascular:  Negative for chest pain, palpitations and leg swelling.  Gastrointestinal:  Negative for abdominal pain, constipation and diarrhea.  Endocrine: Negative for polydipsia and polyuria.  Genitourinary:  Negative for dysuria and hematuria.  Musculoskeletal:  Positive for arthralgias (both hands).  Neurological:  Negative for dizziness, tremors, weakness, light-headedness, numbness and headaches.  Psychiatric/Behavioral:  Negative for dysphoric mood and sleep disturbance. The patient is not nervous/anxious.    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   Varicose veins of leg with pain, bilateral 07/06/2020   Acquired thrombophilia ([place]) 05/24/2020   Atrial fibrillation, chronic ([place]) 01/19/2020   Type II diabetes mellitus with complication ([place]) 53/29/9242   Gastric polyp    Stomach irritation    Special screening for malignant neoplasms, colon    Colon polyps    Varicose veins of both lower extremities 10/20/2019   Dysphagia 10/20/2019   Osteoarthritis of right hand 10/31/2016   Longstanding persistent atrial fibrillation ([place])    Nonischemic cardiomyopathy ([place]) 04/14/2016   BMI 33.0-33.9,adult 04/14/2016  No Known Allergies  Past Surgical History:  Procedure Laterality Date   ATRIAL FIBRILLATION ABLATION N/A 12/09/2020   Procedure: ATRIAL FIBRILLATION ABLATION;  Surgeon: Vickie Epley, MD;  Location: [place] [facility];  Service: Cardiovascular;  Laterality: N/A;   BIOPSY N/A 01/12/2020   Procedure: BIOPSY;  Surgeon: Virgel Manifold, MD;  Location: [place];   Service: Endoscopy;  Laterality: N/A;   CARDIAC CATHETERIZATION N/A 04/17/2016   Procedure: Left Heart Cath and Coronary Angiography;  Surgeon: Wellington Hampshire, MD;  Location: [place] [facility];  Service: Cardiovascular;  Laterality: N/A;   CARDIOVERSION N/A 10/22/2020   Procedure: CARDIOVERSION;  Surgeon: Nelva Bush, MD;  Location: [place] [facility];  Service: Cardiovascular;  Laterality: N/A;   CHOLECYSTECTOMY     COLON SURGERY  2021   COLONOSCOPY N/A 01/20/2020   Procedure: COLONOSCOPY;  Surgeon: Lesly Rubenstein, MD;  Location: [facility];  Service: Endoscopy;  Laterality: N/A;   COLONOSCOPY WITH PROPOFOL N/A 01/12/2020   Procedure: COLONOSCOPY WITH PROPOFOL;  Surgeon: Virgel Manifold, MD;  Location: [place];  Service: Endoscopy;  Laterality: N/A;  Diabetic - oral meds sleep apnea   ELECTROPHYSIOLOGIC STUDY N/A 05/26/2016   Procedure: Cardioversion;  Surgeon: Wellington Hampshire, MD;  Location: [facility];  Service: Cardiovascular;  Laterality: N/A;   ESOPHAGOGASTRODUODENOSCOPY (EGD) WITH PROPOFOL N/A 01/12/2020   Procedure: ESOPHAGOGASTRODUODENOSCOPY (EGD) WITH PROPOFOL;  Surgeon: Virgel Manifold, MD;  Location: [place];  Service: Endoscopy;  Laterality: N/A;   POLYPECTOMY N/A 01/12/2020   Procedure: POLYPECTOMY;  Surgeon: Virgel Manifold, MD;  Location: [place];  Service: Endoscopy;  Laterality: N/A;    Social History   Tobacco Use   Smoking status: Never   Smokeless tobacco: Never  Vaping Use   Vaping Use: Never used  Substance Use Topics   Alcohol use: Never   Drug use: Never     Medication list has been reviewed and updated.  Current Meds  Medication Sig   apixaban (ELIQUIS) 5 MG TABS tablet Take 1 tablet by mouth twice daily   Celery Seed OIL Using as needed to help with constipation   Cholecalciferol (VITAMIN D3) 1000 units CAPS Take 1,000 Units by mouth 2 (two) times daily.   furosemide (LASIX) 20 MG tablet Take 1  tablet (20 mg total) by mouth daily as needed for fluid or edema.   glucose blood (ONETOUCH VERIO) test strip USE UP TO 4 TIMES DAILY AS DIRECTED   Lancets (ONETOUCH DELICA PLUS [id number]) MISC UP TO 4 TIMES DAILY AS DIRECTED   Lavender Oil OIL Apply 1 application topically daily as needed (stress).   Lemon Oil OIL Take 1 application by mouth daily as needed (add to water).   Lemongrass Oil OIL Apply 1 application topically daily as needed (multiple uses).   Magnesium Bisglycinate (MAG GLYCINATE) 100 MG TABS Take 270 mg by mouth in the morning, at noon, and at bedtime.   metoprolol tartrate (LOPRESSOR) 50 MG tablet Take 0.5 tablets (25 mg total) by mouth 2 (two) times daily.   Multiple Vitamins-Minerals (MULTIVITAMIN WITH MINERALS) tablet Take 1 tablet by mouth daily.   Oil Base OIL Using breathe oil as needed for congestion   OIL OF OREGANO PO Uses as needed   peppermint oil liquid Apply 1 application topically daily as needed (itchy/ cold symptoms).   Tea Tree Oil OIL Apply 1 application topically daily as needed (fungus).   vitamin C (ASCORBIC ACID) 500 MG tablet Take 500  mg by mouth daily.    Zoster Vaccine Adjuvanted [facility]) injection Inject 0.5 mLs into the muscle once for 1 dose.   [DISCONTINUED] metFORMIN (GLUCOPHAGE-XR) 500 MG 24 hr tablet Take 1/2 tablet by mouth twice a day    PHQ 2/9 Scores 04/01/2021 02/22/2021 11/22/2020 09/22/2020  PHQ - 2 Score 0 2 1 2   PHQ- 9 Score 2 7 2 3     GAD 7 : Generalized Anxiety Score 04/01/2021 02/22/2021 11/22/2020 08/02/2020  Nervous, Anxious, on Edge 1 1 1  0  Control/stop worrying 0 1 0 0  Worry too much - different things 0 1 0 0  Trouble relaxing 0 1 0 0  Restless 1 1 0 0  Easily annoyed or irritable 0 0 0 0  Afraid - awful might happen 0 1 0 0  Total GAD 7 Score 2 6 1  0  Anxiety Difficulty Not difficult at all Not difficult at all Not difficult at all -    BP Readings from Last 3 Encounters:  04/01/21 124/68  03/16/21 128/80   02/22/21 116/64    Physical Exam Vitals and nursing note reviewed.  Constitutional:      General: She is not in acute distress.    Appearance: She is well-developed.  HENT:     Head: Normocephalic and atraumatic.  Cardiovascular:     Rate and Rhythm: Normal rate and regular rhythm.     Pulses: Normal pulses.  Pulmonary:     Effort: Pulmonary effort is normal. No respiratory distress.     Breath sounds: No wheezing or rhonchi.  Musculoskeletal:     Right hand: Tenderness present. Decreased range of motion.     Left hand: Tenderness present. Decreased range of motion.     Right lower leg: No edema.     Left lower leg: No edema.     Comments: Nodules both palms - base of middle on right and base of ring bilaterally  Skin:    General: Skin is warm and dry.     Capillary Refill: Capillary refill takes less than 2 seconds.     Findings: No rash.  Neurological:     Mental Status: She is alert and oriented to person, place, and time.  Psychiatric:        Mood and Affect: Mood normal.        Behavior: Behavior normal.    Wt Readings from Last 3 Encounters:  04/01/21 216 lb 12.8 oz (98.3 kg)  03/16/21 217 lb (98.4 kg)  02/22/21 213 lb (96.6 kg)    BP 124/68   Pulse 98   Ht 5\' 7"  (1.702 m)   Wt 216 lb 12.8 oz (98.3 kg)   SpO2 98%   BMI 33.96 kg/m   Assessment and Plan: 1. Type II diabetes mellitus with complication (HCC) Clinically stable by exam and report without s/s of hypoglycemia. DM complicated by hypertension and dyslipidemia. Tolerating medications well without side effects or other concerns. Wilder Glade is too expensive for her at this time. - Hemoglobin A1c - Lipid panel - Comprehensive metabolic panel - metFORMIN (GLUCOPHAGE-XR) 500 MG 24 hr tablet; Take 1/2 tablet by mouth twice a day  Dispense: 90 tablet; Refill: 3  2. Adenomatous polyp of colon, unspecified part of colon Due for one year follow up due to the size of the polyp - Ambulatory referral to  Gastroenterology  3. Palmar fascial fibromatosis (dupuytren) Recommend Hand Orthopedic evaluation - she will call if referral is desired  4. Atrial fibrillation, chronic ([place])  Now in [place] and maintained s/p ablation Handicapped parking placard application signed  5. Need for shingles vaccine - Zoster Vaccine Adjuvanted [facility]) injection; Inject 0.5 mLs into the muscle once for 1 dose.  Dispense: 0.5 mL; Refill: 1  6. Encounter for screening mammogram for breast cancer Schedule at [place]  7. Encounter for screening for osteoporosis Schedule at [place]   Partially dictated using [employer]. Any errors are unintentional.  Halina Maidens, MD [facility]  04/01/2021

## 2021-04-02 LAB — COMPREHENSIVE METABOLIC PANEL
ALT: 18 IU/L (ref 0–32)
AST: 19 IU/L (ref 0–40)
Albumin/Globulin Ratio: 1.8 (ref 1.2–2.2)
Albumin: 4.6 g/dL (ref 3.8–4.8)
Alkaline Phosphatase: 111 IU/L (ref 44–121)
BUN/Creatinine Ratio: 12 (ref 12–28)
BUN: 11 mg/dL (ref 8–27)
Bilirubin Total: 0.4 mg/dL (ref 0.0–1.2)
CO2: 24 mmol/L (ref 20–29)
Calcium: 9.7 mg/dL (ref 8.7–10.3)
Chloride: 105 mmol/L (ref 96–106)
Creatinine, Ser: 0.9 mg/dL (ref 0.57–1.00)
Globulin, Total: 2.5 g/dL (ref 1.5–4.5)
Glucose: 105 mg/dL — ABNORMAL HIGH (ref 70–99)
Potassium: 4.5 mmol/L (ref 3.5–5.2)
Sodium: 141 mmol/L (ref 134–144)
Total Protein: 7.1 g/dL (ref 6.0–8.5)
eGFR: 71 mL/min/{1.73_m2} (ref >59)

## 2021-04-02 LAB — LIPID PANEL
Chol/HDL Ratio: 3.9 ratio (ref 0.0–4.4)
Cholesterol, Total: 166 mg/dL (ref 100–199)
HDL: 43 mg/dL (ref >39)
LDL Chol Calc (NIH): 103 mg/dL — ABNORMAL HIGH (ref 0–99)
Triglycerides: 112 mg/dL (ref 0–149)
VLDL Cholesterol Cal: 20 mg/dL (ref 5–40)

## 2021-04-02 LAB — HEMOGLOBIN A1C
Est. average glucose Bld gHb Est-mCnc: 114 mg/dL
Hgb A1c MFr Bld: 5.6 % (ref 4.8–5.6)

## 2021-04-11 ENCOUNTER — Telehealth: Payer: Self-pay

## 2021-04-11 NOTE — Telephone Encounter (Signed)
Copied from [place] [id number]. Topic: Referral - Status >> Apr 11, 2021 11:49 AM Robina Ade, Helene Kelp D wrote: Reason for CRM: Patient called and said that [facility] on [address]. Needs order fax to them so she can make an appt for it. Please call patient is any questions.

## 2021-04-13 NOTE — Telephone Encounter (Signed)
[facility] called and stated the order needed is for a bone density   Fax# [phone]

## 2021-04-13 NOTE — Telephone Encounter (Signed)
Faxed Dexa order to [facility]

## 2021-04-18 ENCOUNTER — Ambulatory Visit: Payer: Medicare Other | Admitting: Internal Medicine

## 2021-04-18 ENCOUNTER — Other Ambulatory Visit: Payer: Self-pay

## 2021-04-18 ENCOUNTER — Emergency Department
Admission: EM | Admit: 2021-04-18 | Discharge: 2021-04-18 | Disposition: A | Discharge status: Home / Self Care | Payer: Medicare Other | Attending: Emergency Medicine | Admitting: Emergency Medicine

## 2021-04-18 ENCOUNTER — Encounter: Payer: Self-pay | Admitting: Emergency Medicine

## 2021-04-18 ENCOUNTER — Emergency Department: Payer: Medicare Other

## 2021-04-18 ENCOUNTER — Ambulatory Visit: Payer: Self-pay

## 2021-04-18 DIAGNOSIS — I110 Hypertensive heart disease with heart failure: Secondary | ICD-10-CM | POA: Diagnosis not present

## 2021-04-18 DIAGNOSIS — R051 Acute cough: Secondary | ICD-10-CM

## 2021-04-18 DIAGNOSIS — Z7984 Long term (current) use of oral hypoglycemic drugs: Secondary | ICD-10-CM | POA: Insufficient documentation

## 2021-04-18 DIAGNOSIS — I5022 Chronic systolic (congestive) heart failure: Secondary | ICD-10-CM | POA: Insufficient documentation

## 2021-04-18 DIAGNOSIS — U071 COVID-19: Secondary | ICD-10-CM | POA: Diagnosis not present

## 2021-04-18 DIAGNOSIS — E119 Type 2 diabetes mellitus without complications: Secondary | ICD-10-CM | POA: Insufficient documentation

## 2021-04-18 DIAGNOSIS — Z7901 Long term (current) use of anticoagulants: Secondary | ICD-10-CM | POA: Insufficient documentation

## 2021-04-18 DIAGNOSIS — Z79899 Other long term (current) drug therapy: Secondary | ICD-10-CM | POA: Insufficient documentation

## 2021-04-18 DIAGNOSIS — R0981 Nasal congestion: Secondary | ICD-10-CM

## 2021-04-18 DIAGNOSIS — R059 Cough, unspecified: Secondary | ICD-10-CM | POA: Diagnosis not present

## 2021-04-18 NOTE — Discharge Instructions (Signed)
Use Tylenol for pain and fevers.  Up to 1000 mg per dose, up to 4 times per day.  Do not take more than 4000 mg of Tylenol/acetaminophen within 24 hours..  

## 2021-04-18 NOTE — ED Provider Notes (Signed)
[facility] Emergency Department Provider Note ____________________________________________   Event Date/Time   First MD Initiated Contact with Patient 04/18/21 1418     (approximate)  I have reviewed the triage vital signs and the nursing notes.  HISTORY  Chief Complaint Cough and Nasal Congestion   HPI Brandy [name] is a 65 y.o. femalewho presents to the ED for evaluation of cough, congestion and covid.   Chart review indicates A. fib on Eliquis.  CHF.  Patient presents to the ED for evaluation of finger pulse oximeter readings of 93-94% at home.  She reports testing positive for COVID over the weekend and has been feeling badly for about 4 to 5 days.  Reports atraumatic myalgias diffusely, upper respiratory congestion and nonproductive cough.  Poor appetite without emesis, abdominal pain, dysuria.  She has a finger pulse ox at home and reports readings as low as 93-94%.  She had called her doctors nurse line and she was urged to go to the ED for evaluation due to these "low" readings.  She reports that she really feels okay, just feels that she has a bad cold.  She thought the recommendations to go to the ED with a little aggressive, but she presents to the ED to be safe.  Denies orthopnea or increased swelling to her lower extremities.  Reports compliance with her Eliquis.  Denies chest pain, syncope or falls.  Past Medical History:  Diagnosis Date   Acute lower GI bleeding 01/19/2020   Arthritis    Atrial fibrillation with rapid ventricular response (HCC)    Chicken pox    Chronic systolic CHF (congestive heart failure) ([place])    a. 03/2016 Echo: Ef 15-20%, diff HK, ant AK;  b. 05/2016 Echo: Ef 30-35%, diff HK, mildly dil LA/RA.   Diabetes mellitus without complication (HCC)    Dysrhythmia    GERD (gastroesophageal reflux disease)    Heart murmur    Hip discomfort    been going on for 40 years    History of kidney stones    Hypertension    Moderate  mitral regurgitation    a. 03/2016 Echo: mod MR in setting of LV dysfxn.   Motion sickness    car - back seat   NICM (nonischemic cardiomyopathy) ([place])    a. 03/2016 Echo: EF 15-20%, diff HK, ant AK, mod MR, mod dil LA, mildly dil RA;  b. 04/2016 Cath: nl cors;  c. 05/2016 Echo: EF 30-35%, diff HK.   Obesity    Obstructive sleep apnea    compliant with CPAP   Persistent atrial fibrillation ([place])    a. Dx 03/2016;  b. CHA2DS2VASc = 2-->Eliquis 5mg  BID;  b. 05/2016 Failed DCCV x 4.   Sleep apnea    Visit for monitoring Tikosyn therapy 07/24/2016    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   Varicose veins of leg with pain, bilateral 07/06/2020   Acquired thrombophilia ([place]) 05/24/2020   Atrial fibrillation, chronic ([place]) 01/19/2020   Type II diabetes mellitus with complication ([place]) 81/27/5170   Gastric polyp    Stomach irritation    Special screening for malignant neoplasms, colon    Colon polyps    Varicose veins of both lower extremities 10/20/2019   Dysphagia 10/20/2019   Osteoarthritis of right hand 10/31/2016   Longstanding persistent atrial fibrillation ([place])    Nonischemic cardiomyopathy ([place]) 04/14/2016   BMI 33.0-33.9,adult 04/14/2016    Past Surgical  History:  Procedure Laterality Date   ATRIAL FIBRILLATION ABLATION N/A 12/09/2020   Procedure: ATRIAL FIBRILLATION ABLATION;  Surgeon: Vickie Epley, MD;  Location: [place] [facility];  Service: Cardiovascular;  Laterality: N/A;   BIOPSY N/A 01/12/2020   Procedure: BIOPSY;  Surgeon: Virgel Manifold, MD;  Location: [place];  Service: Endoscopy;  Laterality: N/A;   CARDIAC CATHETERIZATION N/A 04/17/2016   Procedure: Left Heart Cath and Coronary Angiography;  Surgeon: Wellington Hampshire, MD;  Location: [place] [facility];  Service: Cardiovascular;  Laterality: N/A;   CARDIOVERSION N/A 10/22/2020   Procedure: CARDIOVERSION;  Surgeon: Nelva Bush, MD;  Location: [place] [facility];  Service: Cardiovascular;  Laterality: N/A;   CHOLECYSTECTOMY     COLON SURGERY  2021   COLONOSCOPY N/A 01/20/2020   Procedure: COLONOSCOPY;  Surgeon: Lesly Rubenstein, MD;  Location: [facility];  Service: Endoscopy;  Laterality: N/A;   COLONOSCOPY WITH PROPOFOL N/A 01/12/2020   Procedure: COLONOSCOPY WITH PROPOFOL;  Surgeon: Virgel Manifold, MD;  Location: [place];  Service: Endoscopy;  Laterality: N/A;  Diabetic - oral meds sleep apnea   ELECTROPHYSIOLOGIC STUDY N/A 05/26/2016   Procedure: Cardioversion;  Surgeon: Wellington Hampshire, MD;  Location: [facility];  Service: Cardiovascular;  Laterality: N/A;   ESOPHAGOGASTRODUODENOSCOPY (EGD) WITH PROPOFOL N/A 01/12/2020   Procedure: ESOPHAGOGASTRODUODENOSCOPY (EGD) WITH PROPOFOL;  Surgeon: Virgel Manifold, MD;  Location: [place];  Service: Endoscopy;  Laterality: N/A;   POLYPECTOMY N/A 01/12/2020   Procedure: POLYPECTOMY;  Surgeon: Virgel Manifold, MD;  Location: [place];  Service: Endoscopy;  Laterality: N/A;    Prior to Admission medications   Medication Sig Start Date End Date Taking? Authorizing Provider  apixaban (ELIQUIS) 5 MG TABS tablet Take 1 tablet by mouth twice daily 12/20/20   Deboraha Sprang, MD  Celery Seed OIL Using as needed to help with constipation    [provider]  Cholecalciferol (VITAMIN D3) 1000 units CAPS Take 1,000 Units by mouth 2 (two) times daily.    [provider]  furosemide (LASIX) 20 MG tablet Take 1 tablet (20 mg total) by mouth daily as needed for fluid or edema. 05/24/20   Glean Hess, MD  glucose blood (ONETOUCH VERIO) test strip USE UP TO 4 TIMES DAILY AS DIRECTED 11/22/20   Glean Hess, MD  Lancets [facility] DELICA PLUS [id number]) [place] UP TO 4 TIMES DAILY AS DIRECTED 11/22/20   Glean Hess, MD  Lavender Oil OIL Apply 1 application topically daily as needed (stress).    [provider]  Lovell Sheehan OIL Take 1 application by mouth daily as needed (add to water).    [provider]  Lemongrass Oil OIL Apply 1 application topically daily as needed (multiple uses).    [provider]  Magnesium Bisglycinate (MAG GLYCINATE) 100 MG TABS Take 270 mg by mouth in the morning, at noon, and at bedtime.    [provider]  metFORMIN (GLUCOPHAGE-XR) 500 MG 24 hr tablet Take 1/2 tablet by mouth twice a day 04/01/21   Glean Hess, MD  metoprolol tartrate (LOPRESSOR) 50 MG tablet Take 0.5 tablets (25 mg total) by mouth 2 (two) times daily. 11/01/20   Deboraha Sprang, MD  Multiple Vitamins-Minerals (MULTIVITAMIN WITH MINERALS) tablet Take 1 tablet by mouth daily.    [provider]  Oil Base OIL Using breathe oil as needed for congestion    [provider]  OIL OF  OREGANO PO Uses as needed    [provider]  peppermint oil liquid Apply 1 application topically daily as needed (itchy/ cold symptoms).    [provider]  Tea Tree Oil OIL Apply 1 application topically daily as needed (fungus).    [provider]  vitamin C (ASCORBIC ACID) 500 MG tablet Take 500 mg by mouth daily.     [provider]    Allergies Patient has no known allergies.  Family History  Problem Relation Age of Onset   COPD Mother    Anxiety disorder Mother    Arthritis Mother    Asthma Mother    Depression Mother    COPD Father    Heart disease Father    Hypertension Father    Arthritis Father    Varicose Veins Father    Diabetes Paternal Aunt    Cancer Maternal Aunt     Social History Social History   Tobacco Use   Smoking status: Never   Smokeless tobacco: Never  Vaping Use   Vaping Use: Never used  Substance Use Topics   Alcohol use: Never   Drug use: Never    Review of Systems  Constitutional: No fever/chills Eyes: No visual changes. ENT: No sore throat. Cardiovascular: Denies chest  pain. Respiratory: Denies shortness of breath.  Positive for cough Gastrointestinal: No abdominal pain.  No nausea, no vomiting.  No diarrhea.  No constipation. Positive for poor appetite Genitourinary: Negative for dysuria. Musculoskeletal: Negative for back pain. Positive for diffuse atraumatic myalgias. Skin: Negative for rash. Neurological: Negative for headaches, focal weakness or numbness.  ____________________________________________   PHYSICAL EXAM:  VITAL SIGNS: Vitals:   04/18/21 1314  BP: 132/75  Pulse: 80  Resp: 20  Temp: 99.4 F (37.4 C)  SpO2: 96%    Constitutional: Alert and oriented. Well appearing and in no acute distress.  Pleasant and conversational in full sentences. Throughout our conversation and my evaluation, I placed her on finger pulse oximeter.  With good waveform, she maintains readings of 95-99%.  Same with standing. Eyes: Conjunctivae are normal. PERRL. EOMI. Head: Atraumatic. Nose: Some mild congestion/rhinnorhea, and occasional sniffling during our conversation. Mouth/Throat: Mucous membranes are moist.  Oropharynx non-erythematous. Neck: No stridor. No cervical spine tenderness to palpation. Cardiovascular: Normal rate, regular rhythm. Grossly normal heart sounds.  Good peripheral circulation. Respiratory: Normal respiratory effort.  No retractions. Lungs CTAB. Gastrointestinal: Soft , nondistended, nontender to palpation. No CVA tenderness. Musculoskeletal: No lower extremity tenderness nor edema.  No joint effusions. No signs of acute trauma. Neurologic:  Normal speech and language. No gross focal neurologic deficits are appreciated. No gait instability noted. Skin:  Skin is warm, dry and intact. No rash noted. Psychiatric: Mood and affect are normal. Speech and behavior are normal. ____________________________________________   LABS (all labs ordered are listed, but only abnormal results are displayed)  Labs Reviewed - No data to  display ____________________________________________  12 Lead EKG   ____________________________________________  RADIOLOGY  ED MD interpretation:  CXR reviewed by me without evidence of acute cardiopulmonary pathology.  Official radiology report(s): DG Chest 2 View  Result Date: 04/18/2021 CLINICAL DATA:  Body aches with cough and nasal congestion. EXAM: CHEST - 2 VIEW COMPARISON:  June 23, 2016 FINDINGS: The heart size and mediastinal contours are within normal limits. Both lungs are clear. The visualized skeletal structures are unremarkable. IMPRESSION: No active cardiopulmonary disease. Electronically Signed   By: Virgina Norfolk M.D.   On: 04/18/2021 15:09  ____________________________________________   PROCEDURES and INTERVENTIONS  Procedure(s) performed (including Critical Care):  Procedures  Medications - No data to display  ____________________________________________   MDM / ED COURSE   65 year old female presents to the ED to get checked out in setting of acute COVID-19 infection, well-appearing and without barriers to outpatient management.  Normal vitals on room air.  No hypoxia during our conversation or with ambulation.  CXR without infiltrate, PTX or evidence of pulmonary congestion.  No evidence of CHF exacerbation.  No barriers to outpatient management.  We reviewed management at home and return precautions for the ED.  Clinical Course as of 04/18/21 1522  Mon Apr 18, 2021  1521 Reassessed.  Feeling well.  We discussed reassuring x-ray and management at home.  Discussed return precautions. [DS]    Clinical Course User Index [DS] Vladimir Crofts, MD    ____________________________________________   FINAL CLINICAL IMPRESSION(S) / ED DIAGNOSES  Final diagnoses:  COVID-19  Nasal congestion  Acute cough     ED Discharge Orders     None        Eulalia Ellerman Tamala Julian   Note:  This document was prepared using Dragon voice recognition software and  may include unintentional dictation errors.    Vladimir Crofts, MD 04/18/21 [phone]

## 2021-04-18 NOTE — ED Notes (Signed)
COVID SWAB SENT TO LAB

## 2021-04-18 NOTE — Telephone Encounter (Signed)
Sx began Friday and self tested Friday. Pt c/o fatigue, chest congestion, SOB with activity and 93-94 % O2 saturation. Pt using acetaminophen for relief of body aches.  Care advice given and pt verbalized understanding. Appt accepted this afternoon with PCP.        Reason for Disposition  Oxygen level (e.g., pulse oximetry) 91 to 94 percent  Answer Assessment - Initial Assessment Questions 1. COVID-19 DIAGNOSIS: "Who made your COVID-19 diagnosis?" "Was it confirmed by a positive lab test or self-test?" If not diagnosed by a doctor (or NP/PA), ask "Are there lots of cases (community spread) where you live?" Note: See public health department website, if unsure.     Last Friday-elf test 2. COVID-19 EXPOSURE: "Was there any known exposure to COVID before the symptoms began?" CDC Definition of close contact: within 6 feet (2 meters) for a total of 15 minutes or more over a 24-hour period.      no 3. ONSET: "When did the COVID-19 symptoms start?"      Last Friday 4. WORST SYMPTOM: "What is your worst symptom?" (e.g., cough, fever, shortness of breath, muscle aches)    fatigue 5. COUGH: "Do you have a cough?" If Yes, ask: "How bad is the cough?"       Yes- productive cloudy to yellow then cloudy 6. FEVER: "Do you have a fever?" If Yes, ask: "What is your temperature, how was it measured, and when did it start?"     no 7. RESPIRATORY STATUS: "Describe your breathing?" (e.g., shortness of breath, wheezing, unable to speak)      SOB with activity, congested chest 8. BETTER-SAME-WORSE: "Are you getting better, staying the same or getting worse compared to yesterday?"  If getting worse, ask, "In what way?"     better 9. HIGH RISK DISEASE: "Do you have any chronic medical problems?" (e.g., asthma, heart or lung disease, weak immune system, obesity, etc.)     Diabetic, h/o afib, obesity 10. VACCINE: "Have you had the COVID-19 vaccine?" If Yes, ask: "Which one, how many shots, when did you get it?"        2- 2021 11. BOOSTER: "Have you received your COVID-19 booster?" If Yes, ask: "Which one and when did you get it?"       1 booster 2022 12. PREGNANCY: "Is there any chance you are pregnant?" "When was your last menstrual period?"       N/a 13. OTHER SYMPTOMS: "Do you have any other symptoms?"  (e.g., chills, fatigue, headache, loss of smell or taste, muscle pain, sore throat)       Runny nose, fatigue, chest congestion, joints ache and headaches 14. O2 SATURATION MONITOR:  "Do you use an oxygen saturation monitor (pulse oximeter) at home?" If Yes, ask "What is your reading (oxygen level) today?" "What is your usual oxygen saturation reading?" (e.g., 95%)       93%  Protocols used: Coronavirus (COVID-19) Diagnosed or Suspected-A-AH

## 2021-04-18 NOTE — ED Triage Notes (Signed)
Pt via POV from home. Pt c/o cough, nasal congestions, and generalized body aches since Saturday. Pt states she said her O2 was 93% so her PCP told her to come here. Pt O2 currently 96%. States she test positive for COVID with and at home test. Pt is A&OX4 and NAD.

## 2021-04-27 ENCOUNTER — Telehealth: Payer: Self-pay

## 2021-04-27 NOTE — Telephone Encounter (Signed)
Called pt as a reminder to call and schedule mammogram and bone density. Pt has appts scheduled for Jan. Pt verbalized understanding.  KP

## 2021-04-28 ENCOUNTER — Ambulatory Visit: Payer: Medicare Other | Admitting: Internal Medicine

## 2021-04-28 ENCOUNTER — Encounter: Payer: Self-pay | Admitting: Internal Medicine

## 2021-04-28 ENCOUNTER — Other Ambulatory Visit: Payer: Self-pay

## 2021-04-28 VITALS — BP 128/70 | HR 75 | Ht 67.0 in | Wt 220.0 lb

## 2021-04-28 DIAGNOSIS — I428 Other cardiomyopathies: Secondary | ICD-10-CM

## 2021-04-28 DIAGNOSIS — I5022 Chronic systolic (congestive) heart failure: Secondary | ICD-10-CM | POA: Diagnosis not present

## 2021-04-28 DIAGNOSIS — I4819 Other persistent atrial fibrillation: Secondary | ICD-10-CM

## 2021-04-28 NOTE — Progress Notes (Signed)
Patient ID: Brandy [name], female   DOB: 02/11/1956, 65 y.o.   MRN: [id number]      Patient Care Team: Glean Hess, MD as PCP - General (Internal Medicine) Vickie Epley, MD as PCP - Electrophysiology (Cardiology) Deboraha Sprang, MD as Consulting Physician (Cardiology) Elgie Collard, MD as Referring Physician (Obstetrics and Gynecology) Haig Prophet Hilton Cork, MD as Consulting Physician (Gastroenterology) Lucky Cowboy Erskine Squibb, MD as Referring Physician (Vascular Surgery) Caroline More, DPM as Consulting Physician (Podiatry) Arelia Sneddon, OD (Optometry)   HPI  Brandy [name] is a 65 y.o. female Seen in follow-up for atrial fibrillation for which dofetilide was started 2/18 with spontaneous conversion.She had recurrent episodes of atrial fibrillation.  She was referred to Dr. [name] to consider ablation.  It was elected to defer.  The decision >> permanent atrial fibrillation but...   Nonischemic cardiomyopathy with persistent left ventricular dysfunction occurring in the context of her atrial fibrillation with rapid and poorly controlled rates. .   There has been interval normalization of LV function with rate control.    She saw Dr. Daune Perch who undertook cardioversion.  She felt considerably better in sinus rhythm and the decision has been made to pursue catheter ablation 7/22 (PVI + CFAE)  anticoagulation with Apixoban  without bleeding  The patient denies chest pain, shortness of breath, nocturnal dyspnea, orthopnea or peripheral edema.  There have been no palpitations, lightheadedness or syncope.       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  Date Cr K Mg Dig Hgb  3/18  0.76 3.9    14.5 (2/18)  4/18  0.76 4.2 2.0    5/18 0.83 4.9   15.1  2/19 0.84 4.1 2.1    10/19 0.96 4.5 2.3  13.6    9/21 0.78 3.7  0.7 9.4 >>10.7  6/22 0.77 4.2    14.6   DATE TEST EF   11/17 Echo  15-20 %   1/18 Echo  35 %   5/18 Echo  50-55% LA size-normal   8/20 Echo 60-65%   5/22 Echo 55-60%        Past Medical History:  Diagnosis Date   Acute lower GI bleeding 01/19/2020   Arthritis    Atrial fibrillation with rapid ventricular response (HCC)    Chicken pox    Chronic systolic CHF (congestive heart failure) ([place])    a. 03/2016 Echo: Ef 15-20%, diff HK, ant AK;  b. 05/2016 Echo: Ef 30-35%, diff HK, mildly dil LA/RA.   Diabetes mellitus without complication (HCC)    Dysrhythmia    GERD (gastroesophageal reflux disease)    Heart murmur    Hip discomfort    been going on for 40 years    History of kidney stones    Hypertension    Moderate mitral regurgitation    a. 03/2016 Echo: mod MR in setting of LV dysfxn.   Motion sickness    car - back seat   NICM (nonischemic cardiomyopathy) ([place])    a. 03/2016 Echo: EF 15-20%, diff HK, ant AK, mod MR, mod dil LA, mildly dil RA;  b. 04/2016 Cath: nl cors;  c. 05/2016 Echo: EF 30-35%, diff HK.   Obesity    Obstructive sleep apnea    compliant with CPAP   Persistent atrial fibrillation ([place])    a. Dx  03/2016;  b. CHA2DS2VASc = 2-->Eliquis 5mg  BID;  b. 05/2016 Failed DCCV x 4.   Sleep apnea    Visit for monitoring Tikosyn therapy 07/24/2016    Past Surgical History:  Procedure Laterality Date   ATRIAL FIBRILLATION ABLATION N/A 12/09/2020   Procedure: ATRIAL FIBRILLATION ABLATION;  Surgeon: Vickie Epley, MD;  Location: [place] [facility];  Service: Cardiovascular;  Laterality: N/A;   BIOPSY N/A 01/12/2020   Procedure: BIOPSY;  Surgeon: Virgel Manifold, MD;  Location: [place];  Service: Endoscopy;  Laterality: N/A;   CARDIAC CATHETERIZATION N/A 04/17/2016   Procedure: Left Heart Cath and Coronary Angiography;  Surgeon: Wellington Hampshire, MD;  Location: [place] [facility];  Service: Cardiovascular;  Laterality: N/A;   CARDIOVERSION N/A 10/22/2020   Procedure: CARDIOVERSION;   Surgeon: Nelva Bush, MD;  Location: [place] [facility];  Service: Cardiovascular;  Laterality: N/A;   CHOLECYSTECTOMY     COLON SURGERY  2021   COLONOSCOPY N/A 01/20/2020   Procedure: COLONOSCOPY;  Surgeon: Lesly Rubenstein, MD;  Location: [facility];  Service: Endoscopy;  Laterality: N/A;   COLONOSCOPY WITH PROPOFOL N/A 01/12/2020   Procedure: COLONOSCOPY WITH PROPOFOL;  Surgeon: Virgel Manifold, MD;  Location: [place];  Service: Endoscopy;  Laterality: N/A;  Diabetic - oral meds sleep apnea   ELECTROPHYSIOLOGIC STUDY N/A 05/26/2016   Procedure: Cardioversion;  Surgeon: Wellington Hampshire, MD;  Location: [facility];  Service: Cardiovascular;  Laterality: N/A;   ESOPHAGOGASTRODUODENOSCOPY (EGD) WITH PROPOFOL N/A 01/12/2020   Procedure: ESOPHAGOGASTRODUODENOSCOPY (EGD) WITH PROPOFOL;  Surgeon: Virgel Manifold, MD;  Location: [place];  Service: Endoscopy;  Laterality: N/A;   POLYPECTOMY N/A 01/12/2020   Procedure: POLYPECTOMY;  Surgeon: Virgel Manifold, MD;  Location: [place];  Service: Endoscopy;  Laterality: N/A;    Current Outpatient Medications  Medication Sig Dispense Refill   apixaban (ELIQUIS) 5 MG TABS tablet Take 1 tablet by mouth twice daily 180 tablet 1   Celery Seed OIL Using as needed to help with constipation     Cholecalciferol (VITAMIN D3) 1000 units CAPS Take 1,000 Units by mouth 2 (two) times daily.     furosemide (LASIX) 20 MG tablet Take 1 tablet (20 mg total) by mouth daily as needed for fluid or edema. 30 tablet 0   glucose blood (ONETOUCH VERIO) test strip USE UP TO 4 TIMES DAILY AS DIRECTED 100 each 1   Lancets (ONETOUCH DELICA PLUS [id number]) M[place] UP TO 4 TIMES DAILY AS DIRECTED 100 each 1   Lavender Oil OIL Apply 1 application topically daily as needed (stress).     Lemon Oil OIL Take 1 application by mouth daily as needed (add to water).     Lemongrass Oil OIL Apply 1 application topically daily as needed (multiple uses).      Magnesium Bisglycinate (MAG GLYCINATE) 100 MG TABS Take 270 mg by mouth in the morning, at noon, and at bedtime.     metFORMIN (GLUCOPHAGE-XR) 500 MG 24 hr tablet Take 1/2 tablet by mouth twice a day 90 tablet 3   metoprolol tartrate (LOPRESSOR) 50 MG tablet Take 0.5 tablets (25 mg total) by mouth 2 (two) times daily. 90 tablet 3   Multiple Vitamins-Minerals (MULTIVITAMIN WITH MINERALS) tablet Take 1 tablet by mouth daily.     Oil Base OIL Breathe oil as needed for congestion     OIL OF OREGANO PO Uses as needed     peppermint oil liquid Apply 1 application topically  daily as needed (itchy/ cold symptoms).     Tea Tree Oil OIL Apply 1 application topically daily as needed (fungus).     vitamin C (ASCORBIC ACID) 500 MG tablet Take 500 mg by mouth daily.      No current facility-administered medications for this visit.    No Known Allergies  Physical Exam: BP 128/70 (BP Location: Left Arm, Patient Position: Sitting, Cuff Size: Normal)    Pulse 75    Ht 5\' 7"  (1.702 m)    Wt 220 lb (99.8 kg)    SpO2 98%    BMI 34.46 kg/m  Well developed and nourished in no acute distress HENT normal Neck supple with JVP-  flat   Clear Regular rate and rhythm, no murmurs or gallops Abd-soft with active BS No Clubbing cyanosis edema Skin-warm and dry A & Oriented  Grossly normal sensory and motor function  ECG sinus @ 75 19/07/38   Assessment and  Plan  Atrial fibrillation-persistent long-term   Cardiomyopathy-- nonischemic-rate related resolved  Hypertension/orthostasis  Sinus bradycardia  Obesity resolving    Atrial fibrillation has been quiescient even through recent COVID.  Dr. Quentin Ore has discussed watchman and his last note says Eliquis indefinitely.  Lengthy discussion today regarding the role of watchman and anticoagulation the patient's post ablation and observed that while this is part of the guidelines we continue to await data as to how we might better choose the subpopulations  post ablation who need anticoagulation.  Hence will defer any decisions regarding watchman at this time and continue anticoagulation with Eliquis 5 mg twice daily.  It is noteworthy that Pradaxa is currently generic but is still more than $200 a month given on [employer]  Blood pressure is well controlled.  Continue metoprolol 25 twice daily.

## 2021-04-28 NOTE — Patient Instructions (Signed)
Medication Instructions:  - Your physician recommends that you continue on your current medications as directed. Please refer to the Current Medication list given to you today.  *If you need a refill on your cardiac medications before your next appointment, please call your pharmacy*   Lab Work: - none ordered  If you have labs (blood work) drawn today and your tests are completely normal, you will receive your results only by: [place] (if you have MyChart) OR A paper copy in the mail If you have any lab test that is abnormal or we need to change your treatment, we will call you to review the results.   Testing/Procedures: - Your physician has requested that you have an echocardiogram- in 6 months. Echocardiography is a painless test that uses sound waves to create images of your heart. It provides your doctor with information about the size and shape of your heart and how well your hearts chambers and valves are working. This procedure takes approximately one hour. There are no restrictions for this procedure. There is a possibility that an IV may need to be started during your test to inject an image enhancing agent. This is done to obtain more optimal pictures of your heart. Therefore we ask that you do at least drink some water prior to coming in to hydrate your veins.     Follow-Up: At [facility], you and your health needs are our priority.  As part of our continuing mission to provide you with exceptional heart care, we have created designated Provider Care Teams.  These Care Teams include your primary Cardiologist (physician) and Advanced Practice Providers (APPs -  Physician Assistants and Nurse Practitioners) who all work together to provide you with the care you need, when you need it.  We recommend signing up for the patient portal called "MyChart".  Sign up information is provided on this After Visit Summary.  MyChart is used to connect with patients for Virtual Visits  (Telemedicine).  Patients are able to view lab/test results, encounter notes, upcoming appointments, etc.  Non-urgent messages can be sent to your provider as well.   To learn more about what you can do with MyChart, go to [email or web address].    Your next appointment:   6 month(s)/ after the echocardiogram is completed  The format for your next appointment:   In Person  Provider:   Virl Axe, MD    Other Instructions  Echocardiogram An echocardiogram is a test that uses sound waves (ultrasound) to produce images of the heart. Images from an echocardiogram can provide important information about: Heart size and shape. The size and thickness and movement of your heart's walls. Heart muscle function and strength. Heart valve function or if you have stenosis. Stenosis is when the heart valves are too narrow. If blood is flowing backward through the heart valves (regurgitation). A tumor or infectious growth around the heart valves. Areas of heart muscle that are not working well because of poor blood flow or injury from a heart attack. Aneurysm detection. An aneurysm is a weak or damaged part of an artery wall. The wall bulges out from the normal force of blood pumping through the body. Tell a health care provider about: Any allergies you have. All medicines you are taking, including vitamins, herbs, eye drops, creams, and over-the-counter medicines. Any blood disorders you have. Any surgeries you have had. Any medical conditions you have. Whether you are pregnant or may be pregnant. What are the risks? Generally, this is  a safe test. However, problems may occur, including an allergic reaction to dye (contrast) that may be used during the test. What happens before the test? No specific preparation is needed. You may eat and drink normally. What happens during the test?  You will take off your clothes from the waist up and put on a hospital gown. Electrodes or  electrocardiogram (ECG)patches may be placed on your chest. The electrodes or patches are then connected to a device that monitors your heart rate and rhythm. You will lie down on a table for an ultrasound exam. A gel will be applied to your chest to help sound waves pass through your skin. A handheld device, called a transducer, will be pressed against your chest and moved over your heart. The transducer produces sound waves that travel to your heart and bounce back (or "echo" back) to the transducer. These sound waves will be captured in real-time and changed into images of your heart that can be viewed on a video monitor. The images will be recorded on a computer and reviewed by your health care provider. You may be asked to change positions or hold your breath for a short time. This makes it easier to get different views or better views of your heart. In some cases, you may receive contrast through an IV in one of your veins. This can improve the quality of the pictures from your heart. The procedure may vary among health care providers and hospitals. What can I expect after the test? You may return to your normal, everyday life, including diet, activities, and medicines, unless your health care provider tells you not to do that. Follow these instructions at home: It is up to you to get the results of your test. Ask your health care provider, or the department that is doing the test, when your results will be ready. Keep all follow-up visits. This is important. Summary An echocardiogram is a test that uses sound waves (ultrasound) to produce images of the heart. Images from an echocardiogram can provide important information about the size and shape of your heart, heart muscle function, heart valve function, and other possible heart problems. You do not need to do anything to prepare before this test. You may eat and drink normally. After the echocardiogram is completed, you may return to your  normal, everyday life, unless your health care provider tells you not to do that. This information is not intended to replace advice given to you by your health care provider. Make sure you discuss any questions you have with your health care provider. Document Revised: 01/12/2021 Document Reviewed: 12/23/2019 Elsevier Patient Education  2022 [employer].

## 2021-05-04 DIAGNOSIS — Z124 Encounter for screening for malignant neoplasm of cervix: Secondary | ICD-10-CM | POA: Diagnosis not present

## 2021-05-10 LAB — HM PAP SMEAR: HM Pap smear: NEGATIVE

## 2021-05-11 DIAGNOSIS — M8588 Other specified disorders of bone density and structure, other site: Secondary | ICD-10-CM | POA: Diagnosis not present

## 2021-05-11 DIAGNOSIS — Z1231 Encounter for screening mammogram for malignant neoplasm of breast: Secondary | ICD-10-CM | POA: Diagnosis not present

## 2021-05-11 DIAGNOSIS — E2839 Other primary ovarian failure: Secondary | ICD-10-CM | POA: Diagnosis not present

## 2021-05-12 ENCOUNTER — Encounter: Payer: Self-pay | Admitting: Internal Medicine

## 2021-05-12 DIAGNOSIS — E1169 Type 2 diabetes mellitus with other specified complication: Secondary | ICD-10-CM

## 2021-05-12 DIAGNOSIS — E785 Hyperlipidemia, unspecified: Secondary | ICD-10-CM | POA: Insufficient documentation

## 2021-05-12 HISTORY — DX: Type 2 diabetes mellitus with other specified complication: E11.69

## 2021-05-13 ENCOUNTER — Telehealth: Payer: Self-pay | Admitting: Internal Medicine

## 2021-05-13 NOTE — Telephone Encounter (Signed)
Bone density is decreased, esp at the hip.  Recommend calcium 1200 mg per day and Vitamin D 1000 IU daily plus weight bearing exercise.  Will repeat DEXA in 2 years.

## 2021-05-13 NOTE — Telephone Encounter (Signed)
Patient informed - will only do Vitamin D due to kidney stones and calcium deposits.

## 2021-05-24 ENCOUNTER — Other Ambulatory Visit: Payer: Self-pay | Admitting: Internal Medicine

## 2021-05-24 ENCOUNTER — Telehealth: Payer: Self-pay | Admitting: Internal Medicine

## 2021-05-24 DIAGNOSIS — E1169 Type 2 diabetes mellitus with other specified complication: Secondary | ICD-10-CM

## 2021-05-24 DIAGNOSIS — E785 Hyperlipidemia, unspecified: Secondary | ICD-10-CM

## 2021-05-24 DIAGNOSIS — E118 Type 2 diabetes mellitus with unspecified complications: Secondary | ICD-10-CM

## 2021-05-24 NOTE — Telephone Encounter (Signed)
° ° ° °  Pre-operative Risk Assessment    Patient Name: Brandy [name]  DOB: 1955/12/13 MRN: [id number]      Request for Surgical Clearance    Procedure:   colonoscopy  Date of Surgery:  Clearance 05/31/21                                 Surgeon:  Dr. Stephanie Acre  Surgeon's Group or Practice Name: [facility] Phone number:  [phone] Fax number:  [phone]   Type of Clearance Requested:   - Medical  - Pharmacy:  Hold Apixaban (Eliquis) up to cards   Type of Anesthesia:   propofol   Additional requests/questions:    Signed, Selinda Orion   05/24/2021, 12:45 PM

## 2021-05-24 NOTE — Telephone Encounter (Signed)
Patient with diagnosis of A Fib on eliquis for anticoagulation.    Procedure: colonoscopy Date of procedure: 05/31/21   CHA2DS2-VASc Score = 6  This indicates a 9.7% annual risk of stroke. The patient's score is based upon: CHF History: 1 HTN History: 1 Diabetes History: 1 Stroke History: 0 Vascular Disease History: 1 Age Score: 1 Gender Score: 1   CrCl 75 mL/min using adjusted body weight Platelet count 267K   Per office protocol, patient can hold Eliquis for 1 day prior to procedure.

## 2021-05-24 NOTE — Telephone Encounter (Signed)
Requested Prescriptions  Pending Prescriptions Disp Refills   glucose blood (ONETOUCH VERIO) test strip [Pharmacy Med Name: OneTouch Verio In Vitro Strip] 100 each 0    Sig: USE 1 STRIP TO CHECK GLUCOSE UP TO 4 TIMES DAILY AS DIRECTED     Endocrinology: Diabetes - Testing Supplies Passed - 05/24/2021  9:42 AM      Passed - Valid encounter within last 12 months    Recent Outpatient Visits          1 month ago Type II diabetes mellitus with complication [facility])   [facility] Glean Hess, MD   3 months ago Acute cystitis without hematuria   [facility] Glean Hess, MD   6 months ago Type II diabetes mellitus with complication [facility])   [facility] Glean Hess, MD   9 months ago Suspected COVID-19 virus infection   [facility] Glean Hess, MD   1 year ago History of lower GI bleeding   [facility] Glean Hess, MD      Future Appointments            In 2 months Army Melia Jesse Sans, MD [facility], [place]   In 5 months Deboraha Sprang, MD [facility], [place] (ONETOUCH DELICA PLUS [id number]) [place] [Pharmacy Med Name: Jonetta Speak LAN 38O MIS] 875 each 0    Sig: USE  1 TO CHECK GLUCOSE UP TO 4 TIMES DAILY AS DIRECTED     Endocrinology: Diabetes - Testing Supplies Passed - 05/24/2021  9:42 AM      Passed - Valid encounter within last 12 months    Recent Outpatient Visits          1 month ago Type II diabetes mellitus with complication [facility])   [facility] Glean Hess, MD   3 months ago Acute cystitis without hematuria   [facility] Glean Hess, MD   6 months ago Type II diabetes mellitus with complication [facility])   [facility] Glean Hess, MD   9 months ago Suspected COVID-19 virus infection   [facility] Glean Hess, MD   1 year ago History of lower GI bleeding   [facility] Glean Hess, MD      Future Appointments            In 2 months Army Melia Jesse Sans, MD [facility], [place]   In 5 months Deboraha Sprang, MD [facility], [place]

## 2021-05-24 NOTE — Telephone Encounter (Signed)
° °  Primary Cardiologist: Vickie Epley, MD  Chart reviewed as part of pre-operative protocol coverage. Given past medical history and time since last visit, based on ACC/AHA guidelines, KELSHA OLDER would be at acceptable risk for the planned procedure without further cardiovascular testing.   Patient with diagnosis of A Fib on eliquis for anticoagulation.     Procedure: colonoscopy Date of procedure: 05/31/21     CHA2DS2-VASc Score = 6  This indicates a 9.7% annual risk of stroke. The patient's score is based upon: CHF History: 1 HTN History: 1 Diabetes History: 1 Stroke History: 0 Vascular Disease History: 1 Age Score: 1 Gender Score: 1   CrCl 75 mL/min using adjusted body weight Platelet count 267K     Per office protocol, patient can hold Eliquis for 1 day prior to procedure.  I will route this recommendation to the requesting party via Epic fax function and remove from pre-op pool.  Please call with questions.  Jossie Ng. Tyller Bowlby NP-C    05/24/2021, 3:32 PM [place] [address] Office [phone] Fax [phone]

## 2021-05-27 ENCOUNTER — Telehealth: Payer: Self-pay

## 2021-05-27 NOTE — Telephone Encounter (Signed)
Called pt left VM that he disability parking placard is ready for pick up.  PEC nurse may give results to patient if they return call to clinic, a CRM has been created.  KP

## 2021-05-31 DIAGNOSIS — E119 Type 2 diabetes mellitus without complications: Secondary | ICD-10-CM | POA: Diagnosis not present

## 2021-05-31 DIAGNOSIS — I4891 Unspecified atrial fibrillation: Secondary | ICD-10-CM | POA: Diagnosis not present

## 2021-05-31 DIAGNOSIS — I110 Hypertensive heart disease with heart failure: Secondary | ICD-10-CM | POA: Diagnosis not present

## 2021-05-31 DIAGNOSIS — Z1211 Encounter for screening for malignant neoplasm of colon: Secondary | ICD-10-CM | POA: Diagnosis not present

## 2021-05-31 DIAGNOSIS — Z6833 Body mass index (BMI) 33.0-33.9, adult: Secondary | ICD-10-CM | POA: Diagnosis not present

## 2021-05-31 DIAGNOSIS — M1990 Unspecified osteoarthritis, unspecified site: Secondary | ICD-10-CM | POA: Diagnosis not present

## 2021-05-31 DIAGNOSIS — K219 Gastro-esophageal reflux disease without esophagitis: Secondary | ICD-10-CM | POA: Diagnosis not present

## 2021-05-31 DIAGNOSIS — I509 Heart failure, unspecified: Secondary | ICD-10-CM | POA: Diagnosis not present

## 2021-05-31 DIAGNOSIS — Z86010 Personal history of colonic polyps: Secondary | ICD-10-CM | POA: Diagnosis not present

## 2021-05-31 DIAGNOSIS — Z79899 Other long term (current) drug therapy: Secondary | ICD-10-CM | POA: Diagnosis not present

## 2021-05-31 DIAGNOSIS — Z7901 Long term (current) use of anticoagulants: Secondary | ICD-10-CM | POA: Diagnosis not present

## 2021-05-31 DIAGNOSIS — G4730 Sleep apnea, unspecified: Secondary | ICD-10-CM | POA: Diagnosis not present

## 2021-05-31 DIAGNOSIS — Z7984 Long term (current) use of oral hypoglycemic drugs: Secondary | ICD-10-CM | POA: Diagnosis not present

## 2021-05-31 DIAGNOSIS — E669 Obesity, unspecified: Secondary | ICD-10-CM | POA: Diagnosis not present

## 2021-05-31 LAB — HM COLONOSCOPY

## 2021-06-20 ENCOUNTER — Other Ambulatory Visit: Payer: Self-pay | Admitting: Internal Medicine

## 2021-06-21 NOTE — Telephone Encounter (Signed)
Pt last saw Dr Caryl Comes 04/28/21, last labs 04/01/21 Creat 0.90, age 66, weight 99.8kg, based on specified criteria pt is on appropriate dosage of Eliquis 5mg  BID for afib.  Will refill rx.

## 2021-07-27 DIAGNOSIS — Z872 Personal history of diseases of the skin and subcutaneous tissue: Secondary | ICD-10-CM | POA: Diagnosis not present

## 2021-07-27 DIAGNOSIS — Z86018 Personal history of other benign neoplasm: Secondary | ICD-10-CM | POA: Diagnosis not present

## 2021-07-27 DIAGNOSIS — L920 Granuloma annulare: Secondary | ICD-10-CM | POA: Diagnosis not present

## 2021-07-27 DIAGNOSIS — L578 Other skin changes due to chronic exposure to nonionizing radiation: Secondary | ICD-10-CM | POA: Diagnosis not present

## 2021-08-01 ENCOUNTER — Other Ambulatory Visit: Payer: Self-pay

## 2021-08-01 ENCOUNTER — Encounter: Payer: Self-pay | Admitting: Internal Medicine

## 2021-08-01 ENCOUNTER — Ambulatory Visit (INDEPENDENT_AMBULATORY_CARE_PROVIDER_SITE_OTHER): Payer: Medicare Other | Admitting: Internal Medicine

## 2021-08-01 VITALS — BP 122/68 | HR 72 | Ht 67.0 in | Wt 220.0 lb

## 2021-08-01 DIAGNOSIS — D6869 Other thrombophilia: Secondary | ICD-10-CM | POA: Diagnosis not present

## 2021-08-01 DIAGNOSIS — E118 Type 2 diabetes mellitus with unspecified complications: Secondary | ICD-10-CM | POA: Diagnosis not present

## 2021-08-01 DIAGNOSIS — I428 Other cardiomyopathies: Secondary | ICD-10-CM

## 2021-08-01 DIAGNOSIS — E1169 Type 2 diabetes mellitus with other specified complication: Secondary | ICD-10-CM | POA: Diagnosis not present

## 2021-08-01 DIAGNOSIS — E785 Hyperlipidemia, unspecified: Secondary | ICD-10-CM

## 2021-08-01 MED ORDER — ONETOUCH VERIO VI STRP: ORAL_STRIP | 3 refills | Status: DC

## 2021-08-01 MED ORDER — ROSUVASTATIN CALCIUM 5 MG PO TABS: 5.0000 mg | ORAL_TABLET | ORAL | 3 refills | Status: DC

## 2021-08-01 MED ORDER — ONETOUCH DELICA PLUS LANCET33G MISC: 3 refills | Status: DC

## 2021-08-01 NOTE — Progress Notes (Signed)
? ? ?Date:  08/01/2021  ? ?Name:  Brandy [name]   DOB:  03/12/1956   MRN:  [id number] ? ? ?Chief Complaint: Diabetes (MICRO.) ? ?Diabetes ?She presents for her follow-up diabetic visit. She has type 2 diabetes mellitus. Her disease course has been stable. Pertinent negatives for hypoglycemia include no headaches or tremors. Pertinent negatives for diabetes include no chest pain, no fatigue, no polydipsia and no polyuria. Current diabetic treatment includes oral agent (monotherapy) (metformin). She is compliant with treatment all of the time. There is no change in her home blood glucose trend. Her breakfast blood glucose is taken between 6-7 am. Her breakfast blood glucose range is generally 110-130 mg/dl. An ACE inhibitor/angiotensin II receptor blocker is not being taken.  ?Hyperlipidemia ?This is a chronic problem. The problem is uncontrolled. Pertinent negatives include no chest pain or shortness of breath. She is currently on no antihyperlipidemic treatment.  ?Afib - has been doing well s/p ablation.  Still on Eliquis but may be able to stop it in the future. ? ?Lab Results  ?Component Value Date  ? NA 141 04/01/2021  ? K 4.5 04/01/2021  ? CO2 24 04/01/2021  ? GLUCOSE 105 (H) 04/01/2021  ? BUN 11 04/01/2021  ? CREATININE 0.90 04/01/2021  ? CALCIUM 9.7 04/01/2021  ? EGFR 71 04/01/2021  ? GFRNONAA >60 11/16/2020  ? ?Lab Results  ?Component Value Date  ? CHOL 166 04/01/2021  ? HDL 43 04/01/2021  ? LDLCALC 103 (H) 04/01/2021  ? LDLDIRECT 131.0 10/31/2016  ? TRIG 112 04/01/2021  ? CHOLHDL 3.9 04/01/2021  ? ?Lab Results  ?Component Value Date  ? TSH 1.880 10/20/2019  ? ?Lab Results  ?Component Value Date  ? HGBA1C 5.6 04/01/2021  ? ?Lab Results  ?Component Value Date  ? WBC 6.5 11/16/2020  ? HGB 14.6 11/16/2020  ? HCT 42.7 11/16/2020  ? MCV 92.4 11/16/2020  ? PLT 267 11/16/2020  ? ?Lab Results  ?Component Value Date  ? ALT 18 04/01/2021  ? AST 19 04/01/2021  ? ALKPHOS 111 04/01/2021  ? BILITOT 0.4 04/01/2021  ? ?Lab  Results  ?Component Value Date  ? VD25OH 38.71 10/31/2016  ?  ? ?Review of Systems  ?Constitutional:  Negative for appetite change, fatigue, fever and unexpected weight change.  ?HENT:  Negative for tinnitus and trouble swallowing.   ?Eyes:  Negative for visual disturbance.  ?Respiratory:  Negative for cough, chest tightness and shortness of breath.   ?Cardiovascular:  Negative for chest pain, palpitations and leg swelling.  ?Gastrointestinal:  Negative for abdominal pain.  ?Endocrine: Negative for polydipsia and polyuria.  ?Genitourinary:  Negative for dysuria and hematuria.  ?Musculoskeletal:  Negative for arthralgias.  ?Neurological:  Negative for tremors, numbness and headaches.  ?Psychiatric/Behavioral:  Negative for dysphoric mood.   ? ?Patient Active Problem List  ? Diagnosis Date Noted  ? Hyperlipidemia associated with type 2 diabetes mellitus ([place]) 05/12/2021  ? 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 Varicose veins of leg with pain, bilateral 07/06/2020  ? Acquired thrombophilia ([place]) 05/24/2020  ? Type II diabetes mellitus with complication ([place]) 58/85/0277  ? Gastric polyp   ? Colon polyps   ? Varicose veins of both lower extremities 10/20/2019  ? Dysphagia 10/20/2019  ? Osteoarthritis of right hand 10/31/2016  ? Longstanding persistent atrial fibrillation ([place])   ? Nonischemic cardiomyopathy ([place]) 04/14/2016  ? BMI 33.0-33.9,adult 04/14/2016  ? ? ?No Known Allergies ? ?Past Surgical History:  ?Procedure Laterality Date  ? ATRIAL FIBRILLATION  ABLATION N/A 12/09/2020  ? Procedure: ATRIAL FIBRILLATION ABLATION;  Surgeon: Vickie Epley, MD;  Location: [place] [facility];  Service: Cardiovascular;  Laterality: N/A;  ? BIOPSY N/A 01/12/2020  ? Procedure: BIOPSY;  Surgeon: Virgel Manifold, MD;  Location: [place];  Service: Endoscopy;  Laterality: N/A;  ? CARDIAC CATHETERIZATION N/A 04/17/2016  ? Procedure: Left Heart Cath and Coronary Angiography;  Surgeon: Wellington Hampshire,  MD;  Location: [place] [facility];  Service: Cardiovascular;  Laterality: N/A;  ? CARDIOVERSION N/A 10/22/2020  ? Procedure: CARDIOVERSION;  Surgeon: Nelva Bush, MD;  Location: [facility];  Service: Cardiovascular;  Laterality: N/A;  ? CHOLECYSTECTOMY    ? COLON SURGERY  2021  ? COLONOSCOPY N/A 01/20/2020  ? Procedure: COLONOSCOPY;  Surgeon: Lesly Rubenstein, MD;  Location: [facility];  Service: Endoscopy;  Laterality: N/A;  ? COLONOSCOPY WITH PROPOFOL N/A 01/12/2020  ? Procedure: COLONOSCOPY WITH PROPOFOL;  Surgeon: Virgel Manifold, MD;  Location: [place];  Service: Endoscopy;  Laterality: N/A;  Diabetic - oral meds ?sleep apnea  ? ELECTROPHYSIOLOGIC STUDY N/A 05/26/2016  ? Procedure: Cardioversion;  Surgeon: Wellington Hampshire, MD;  Location: [facility];  Service: Cardiovascular;  Laterality: N/A;  ? ESOPHAGOGASTRODUODENOSCOPY (EGD) WITH PROPOFOL N/A 01/12/2020  ? Procedure: ESOPHAGOGASTRODUODENOSCOPY (EGD) WITH PROPOFOL;  Surgeon: Virgel Manifold, MD;  Location: [place];  Service: Endoscopy;  Laterality: N/A;  ? POLYPECTOMY N/A 01/12/2020  ? Procedure: POLYPECTOMY;  Surgeon: Virgel Manifold, MD;  Location: [place];  Service: Endoscopy;  Laterality: N/A;  ? ? ?Social History  ? ?Tobacco Use  ? Smoking status: Never  ? Smokeless tobacco: Never  ?Vaping Use  ? Vaping Use: Never used  ?Substance Use Topics  ? Alcohol use: Never  ? Drug use: Never  ? ? ? ?Medication list has been reviewed and updated. ? ?Current Meds  ?Medication Sig  ? apixaban (ELIQUIS) 5 MG TABS tablet Take 1 tablet by mouth twice daily  ? Celery Seed OIL Using as needed to help with constipation  ? Cholecalciferol (VITAMIN D3) 1000 units CAPS Take 1,000 Units by mouth 2 (two) times daily.  ? furosemide (LASIX) 20 MG tablet Take 1 tablet (20 mg total) by mouth daily as needed for fluid or edema.  ? glucose blood (ONETOUCH VERIO) test strip USE 1 STRIP TO CHECK GLUCOSE UP TO 4 TIMES DAILY AS  DIRECTED  ? Lancets (ONETOUCH DELICA PLUS [id number]) MISC USE  1 TO CHECK GLUCOSE UP TO 4 TIMES DAILY AS DIRECTED  ? Lavender Oil OIL Apply 1 application topically daily as needed (stress).  ? Lemon Oil OIL Take 1 application by mouth daily as needed (add to water).  ? Lemongrass Oil OIL Apply 1 application topically daily as needed (multiple uses).  ? Magnesium Bisglycinate (MAG GLYCINATE) 100 MG TABS Take 270 mg by mouth in the morning, at noon, and at bedtime.  ? metFORMIN (GLUCOPHAGE-XR) 500 MG 24 hr tablet Take 1/2 tablet by mouth twice a day  ? metoprolol tartrate (LOPRESSOR) 50 MG tablet Take 0.5 tablets (25 mg total) by mouth 2 (two) times daily.  ? Multiple Vitamins-Minerals (MULTIVITAMIN WITH MINERALS) tablet Take 1 tablet by mouth daily.  ? Oil Base OIL Breathe oil as needed for congestion  ? OIL OF OREGANO PO Uses as needed  ? peppermint oil liquid Apply 1 application topically daily as needed (itchy/ cold symptoms).  ? Tea Tree Oil OIL Apply 1 application topically daily as needed (  fungus).  ? vitamin B-12 (CYANOCOBALAMIN) 500 MCG tablet Take 500 mcg by mouth daily.  ? vitamin C (ASCORBIC ACID) 500 MG tablet Take 500 mg by mouth daily.   ? ? ?PHQ 2/9 Scores 08/01/2021 04/01/2021 02/22/2021 11/22/2020  ?PHQ - 2 Score 0 0 2 1  ?PHQ- 9 Score '4 2 7 2  ' ? ? ?GAD 7 : Generalized Anxiety Score 08/01/2021 04/01/2021 02/22/2021 11/22/2020  ?Nervous, Anxious, on Edge '1 1 1 1  ' ?Control/stop worrying 1 0 1 0  ?Worry too much - different things 1 0 1 0  ?Trouble relaxing 0 0 1 0  ?Restless 0 1 1 0  ?Easily annoyed or irritable 0 0 0 0  ?Afraid - awful might happen 0 0 1 0  ?Total GAD 7 Score '3 2 6 1  ' ?Anxiety Difficulty Not difficult at all Not difficult at all Not difficult at all Not difficult at all  ? ? ?BP Readings from Last 3 Encounters:  ?08/01/21 122/68  ?04/28/21 128/70  ?04/18/21 132/75  ? ? ?Physical Exam ?Vitals and nursing note reviewed.  ?Constitutional:   ?   General: She is not in acute distress. ?    Appearance: She is well-developed.  ?HENT:  ?   Head: Normocephalic and atraumatic.  ?Cardiovascular:  ?   Rate and Rhythm: Normal rate and regular rhythm.  ?   Pulses: Normal pulses.  ?   Heart sounds: No murmur hear

## 2021-08-02 LAB — BASIC METABOLIC PANEL
BUN/Creatinine Ratio: 18 (ref 12–28)
BUN: 14 mg/dL (ref 8–27)
CO2: 24 mmol/L (ref 20–29)
Calcium: 9.8 mg/dL (ref 8.7–10.3)
Chloride: 101 mmol/L (ref 96–106)
Creatinine, Ser: 0.77 mg/dL (ref 0.57–1.00)
Glucose: 89 mg/dL (ref 70–99)
Potassium: 4.2 mmol/L (ref 3.5–5.2)
Sodium: 137 mmol/L (ref 134–144)
eGFR: 85 mL/min/{1.73_m2} (ref >59)

## 2021-08-02 LAB — MICROALBUMIN / CREATININE URINE RATIO
Creatinine, Urine: 87.1 mg/dL
Microalb/Creat Ratio: 4 mg/g creat (ref 0–29)
Microalbumin, Urine: 3.2 ug/mL

## 2021-08-02 LAB — HEMOGLOBIN A1C
Est. average glucose Bld gHb Est-mCnc: 114 mg/dL
Hgb A1c MFr Bld: 5.6 % (ref 4.8–5.6)

## 2021-08-02 LAB — TSH: TSH: 1.46 u[IU]/mL (ref 0.450–4.500)

## 2021-08-03 ENCOUNTER — Encounter: Payer: Self-pay | Admitting: Internal Medicine

## 2021-08-04 ENCOUNTER — Other Ambulatory Visit: Payer: Self-pay | Admitting: Internal Medicine

## 2021-09-26 ENCOUNTER — Ambulatory Visit (INDEPENDENT_AMBULATORY_CARE_PROVIDER_SITE_OTHER): Payer: Medicare Other

## 2021-09-26 DIAGNOSIS — Z0000 Encounter for general adult medical examination without abnormal findings: Secondary | ICD-10-CM

## 2021-09-26 NOTE — Progress Notes (Signed)
? ?Subjective:  ? Brandy [name] is a 66 y.o. female who presents for Medicare Annual (Subsequent) preventive examination. ? ?Virtual Visit via Telephone Note ? ?I connected with  Brandy [name] on 09/26/21 at  3:30 PM EDT by telephone and verified that I am speaking with the correct person using two identifiers. ? ?Location: ?Patient: home ?Provider: [facility] ?Persons participating in the virtual visit: patient/Nurse Health Advisor ?  ?I discussed the limitations, risks, security and privacy concerns of performing an evaluation and management service by telephone and the availability of in person appointments. The patient expressed understanding and agreed to proceed. ? ?Interactive audio and video telecommunications were attempted between this nurse and patient, however failed, due to patient having technical difficulties OR patient did not have access to video capability.  We continued and completed visit with audio only. ? ?Some vital signs may be absent or patient reported.  ? ?Clemetine Marker, LPN ? ? ?Review of Systems    ? ?Cardiac Risk Factors include: advanced age (>35mn, >>95women);diabetes mellitus;hypertension;dyslipidemia ? ?   ?Objective:  ?  ?Today's Vitals  ? 09/26/21 1542  ?PainSc: 5   ? ?There is no height or weight on file to calculate BMI. ? ? ?  09/26/2021  ?  3:46 PM 04/18/2021  ?  1:15 PM 12/09/2020  ?  5:44 AM 09/22/2020  ?  3:37 PM 01/20/2020  ?  2:57 PM 01/19/2020  ?  7:00 PM 01/19/2020  ?  6:55 PM  ?Advanced Directives  ?Does Patient Have a Medical Advance Directive? Yes No Yes Yes Yes Yes   ?Type of [place]amed[place]of [place] will  [place] will [place] will Healthcare Power of AFort Oglethor[place]Living will [place]Living will  ?Does patient want to make changes to medical advance directive?   No - Patient declined   No - Patient declined   ?Copy of HHowein Chart?  Yes - validated most recent copy scanned in chart (See row information)  No - copy requested Yes - validated most recent copy scanned in chart (See row information)  No - copy requested   ? ? ?Current Medications (verified) ?Outpatient Encounter Medications as of 09/26/2021  ?Medication Sig  ? apixaban (ELIQUIS) 5 MG TABS tablet Take 1 tablet by mouth twice daily  ? Celery Seed OIL Using as needed to help with constipation  ? Cholecalciferol (VITAMIN D3) 1000 units CAPS Take 1,000 Units by mouth 2 (two) times daily.  ? furosemide (LASIX) 20 MG tablet Take 1 tablet (20 mg total) by mouth daily as needed for fluid or edema.  ? glucose blood (ONETOUCH VERIO) test strip USE 1 STRIP TO CHECK GLUCOSE UP TO 4 TIMES DAILY AS DIRECTED  ? Lancets (ONETOUCH DELICA PLUS [id number] MISC USE  1 TO CHECK GLUCOSE UP TO 4 TIMES DAILY AS DIRECTED  ? Lavender Oil OIL Apply 1 application topically daily as needed (stress).  ? Lemon Oil OIL Take 1 application by mouth daily as needed (add to water).  ? Lemongrass Oil OIL Apply 1 application topically daily as needed (multiple uses).  ? Magnesium Bisglycinate (MAG GLYCINATE) 100 MG TABS Take 270 mg by mouth in the morning, at noon, and at bedtime.  ? metFORMIN (GLUCOPHAGE-XR) 500 MG 24 hr tablet Take 1/2 tablet by mouth twice a day  ? metoprolol tartrate (LOPRESSOR) 50 MG tablet Take 0.5 tablets (25 mg total) by mouth 2 (  two) times daily.  ? Multiple Vitamins-Minerals (MULTIVITAMIN WITH MINERALS) tablet Take 1 tablet by mouth daily.  ? Oil Base OIL Breathe oil as needed for congestion  ? OIL OF OREGANO PO Uses as needed  ? peppermint oil liquid Apply 1 application topically daily as needed (itchy/ cold symptoms).  ? rosuvastatin (CRESTOR) 5 MG tablet Take 1 tablet (5 mg total) by mouth once a week.  ? Tea Tree Oil OIL Apply 1 application topically daily as needed (fungus).  ? vitamin B-12 (CYANOCOBALAMIN) 500 MCG tablet Take 500 mcg by mouth daily.  ? vitamin C (ASCORBIC ACID) 500 MG  tablet Take 500 mg by mouth daily.   ? ?No facility-administered encounter medications on file as of 09/26/2021.  ? ? ?Allergies (verified) ?Patient has no known allergies.  ? ?History: ?Past Medical History:  ?Diagnosis Date  ? Acute lower GI bleeding 01/19/2020  ? Arthritis   ? Atrial fibrillation with rapid ventricular response ([place])   ? Chicken pox   ? Chronic systolic CHF (congestive heart failure) ([place])   ? a. 03/2016 Echo: Ef 15-20%, diff HK, ant AK;  b. 05/2016 Echo: Ef 30-35%, diff HK, mildly dil LA/RA.  ? Diabetes mellitus without complication ([place])   ? Dysrhythmia   ? GERD (gastroesophageal reflux disease)   ? Heart murmur   ? Hip discomfort   ? been going on for 40 years   ? History of kidney stones   ? Hyperlipidemia associated with type 2 diabetes mellitus ([place]) 05/12/2021  ? Hypertension   ? Moderate mitral regurgitation   ? a. 03/2016 Echo: mod MR in setting of LV dysfxn.  ? Motion sickness   ? car - back seat  ? NICM (nonischemic cardiomyopathy) ([place])   ? a. 03/2016 Echo: EF 15-20%, diff HK, ant AK, mod MR, mod dil LA, mildly dil RA;  b. 04/2016 Cath: nl cors;  c. 05/2016 Echo: EF 30-35%, diff HK.  ? Obesity   ? Obstructive sleep apnea   ? compliant with CPAP  ? Persistent atrial fibrillation ([place])   ? a. Dx 03/2016;  b. CHA2DS2VASc = 2-->Eliquis '5mg'$  BID;  b. 05/2016 Failed DCCV x 4.  ? Sleep apnea   ? Stomach irritation   ? Visit for monitoring Tikosyn therapy 07/24/2016  ? ?Past Surgical History:  ?Procedure Laterality Date  ? ATRIAL FIBRILLATION ABLATION N/A 12/09/2020  ? Procedure: ATRIAL FIBRILLATION ABLATION;  Surgeon: Vickie Epley, MD;  Location: [facility];  Service: Cardiovascular;  Laterality: N/A;  ? BIOPSY N/A 01/12/2020  ? Procedure: BIOPSY;  Surgeon: Virgel Manifold, MD;  Location: [place];  Service: Endoscopy;  Laterality: N/A;  ? CARDIAC CATHETERIZATION N/A 04/17/2016  ? Procedure: Left Heart Cath and Coronary Angiography;  Surgeon: Wellington Hampshire, MD;   Location: [facility];  Service: Cardiovascular;  Laterality: N/A;  ? CARDIOVERSION N/A 10/22/2020  ? Procedure: CARDIOVERSION;  Surgeon: Nelva Bush, MD;  Location: [facility];  Service: Cardiovascular;  Laterality: N/A;  ? CHOLECYSTECTOMY    ? COLON SURGERY  2021  ? COLONOSCOPY N/A 01/20/2020  ? Procedure: COLONOSCOPY;  Surgeon: Lesly Rubenstein, MD;  Location: [facility];  Service: Endoscopy;  Laterality: N/A;  ? COLONOSCOPY WITH PROPOFOL N/A 01/12/2020  ? Procedure: COLONOSCOPY WITH PROPOFOL;  Surgeon: Virgel Manifold, MD;  Location: [place];  Service: Endoscopy;  Laterality: N/A;  Diabetic - oral meds ?sleep apnea  ? ELECTROPHYSIOLOGIC STUDY N/A 05/26/2016  ? Procedure: Cardioversion;  Surgeon:  Wellington Hampshire, MD;  Location: [facility];  Service: Cardiovascular;  Laterality: N/A;  ? ESOPHAGOGASTRODUODENOSCOPY (EGD) WITH PROPOFOL N/A 01/12/2020  ? Procedure: ESOPHAGOGASTRODUODENOSCOPY (EGD) WITH PROPOFOL;  Surgeon: Virgel Manifold, MD;  Location: [place];  Service: Endoscopy;  Laterality: N/A;  ? POLYPECTOMY N/A 01/12/2020  ? Procedure: POLYPECTOMY;  Surgeon: Virgel Manifold, MD;  Location: [place];  Service: Endoscopy;  Laterality: N/A;  ? ?Family History  ?Problem Relation Age of Onset  ? COPD Mother   ? Anxiety disorder Mother   ? Arthritis Mother   ? Asthma Mother   ? Depression Mother   ? COPD Father   ? Heart disease Father   ? Hypertension Father   ? Arthritis Father   ? Varicose Veins Father   ? Diabetes Paternal Aunt   ? Cancer Maternal Aunt   ? ?Social History  ? ?Socioeconomic History  ? Marital status: Widowed  ?  Spouse name: Deceased  ? Number of children: 1  ? Years of education: Not on file  ? Highest education level: Not on file  ?Occupational History  ? Not on file  ?Tobacco Use  ? Smoking status: Never  ? Smokeless tobacco: Never  ?Vaping Use  ? Vaping Use: Never used  ?Substance and Sexual Activity  ? Alcohol use: Never  ? Drug  use: Never  ? Sexual activity: Not Currently  ?  Partners: Male  ?Other Topics Concern  ? Not on file  ?Social History Narrative  ? 1 by birth 2 adopted  ? Pt lives alone  ? ?Social Determinants of Health  ? ?[place]

## 2021-09-26 NOTE — Patient Instructions (Signed)
Brandy [name] , ?Thank you for taking time to come for your Medicare Wellness Visit. I appreciate your ongoing commitment to your health goals. Please review the following plan we discussed and let me know if I can assist you in the future.  ? ?Screening recommendations/referrals: ?Colonoscopy: done 05/31/21. Due 05/2024 ?Mammogram: done 05/11/21 ?Bone Density: done 05/11/21 ?Recommended yearly ophthalmology/optometry visit for glaucoma screening and checkup ?Recommended yearly dental visit for hygiene and checkup ? ?Vaccinations: ?Influenza vaccine: due fall 2023 ?Pneumococcal vaccine: done 10/11/2 ?Tdap vaccine: due ?Shingles vaccine: done 06/10/21; due for second dose   ?Covid-19:done 02/06/20, 02/27/20 & 07/29/20 ? ?Conditions/risks identified: Keep up the great work! ? ?Next appointment: Follow up in one year for your annual wellness visit  ? ? ?Preventive Care 40 Years and Older, Female ? ?Preventive care refers to lifestyle choices and visits with your health care provider that can promote health and wellness. ?What does preventive care include? ?A yearly physical exam. This is also called an annual well check. ?Dental exams once or twice a year. ?Routine eye exams. Ask your health care provider how often you should have your eyes checked. ?Personal lifestyle choices, including: ?Daily care of your teeth and gums. ?Regular physical activity. ?Eating a healthy diet. ?Avoiding tobacco and drug use. ?Limiting alcohol use. ?Practicing safe sex. ?Taking low-dose aspirin every day. ?Taking vitamin and mineral supplements as recommended by your health care provider. ?What happens during an annual well check? ?The services and screenings done by your health care provider during your annual well check will depend on your age, overall health, lifestyle risk factors, and family history of disease. ?Counseling  ?Your health care provider may ask you questions about your: ?Alcohol use. ?Tobacco use. ?Drug use. ?Emotional  well-being. ?Home and relationship well-being. ?Sexual activity. ?Eating habits. ?History of falls. ?Memory and ability to understand (cognition). ?Work and work Statistician. ?Reproductive health. ?Screening  ?You may have the following tests or measurements: ?Height, weight, and BMI. ?Blood pressure. ?Lipid and cholesterol levels. These may be checked every 5 years, or more frequently if you are over 36 years old. ?Skin check. ?Lung cancer screening. You may have this screening every year starting at age 43 if you have a 30-pack-year history of smoking and currently smoke or have quit within the past 15 years. ?Fecal occult blood test (FOBT) of the stool. You may have this test every year starting at age 64. ?Flexible sigmoidoscopy or colonoscopy. You may have a sigmoidoscopy every 5 years or a colonoscopy every 10 years starting at age 5. ?Hepatitis C blood test. ?Hepatitis B blood test. ?Sexually transmitted disease (STD) testing. ?Diabetes screening. This is done by checking your blood sugar (glucose) after you have not eaten for a while (fasting). You may have this done every 1-3 years. ?Bone density scan. This is done to screen for osteoporosis. You may have this done starting at age 72. ?Mammogram. This may be done every 1-2 years. Talk to your health care provider about how often you should have regular mammograms. ?Talk with your health care provider about your test results, treatment options, and if necessary, the need for more tests. ?Vaccines  ?Your health care provider may recommend certain vaccines, such as: ?Influenza vaccine. This is recommended every year. ?Tetanus, diphtheria, and acellular pertussis (Tdap, Td) vaccine. You may need a Td booster every 10 years. ?Zoster vaccine. You may need this after age 58. ?Pneumococcal 13-valent conjugate (PCV13) vaccine. One dose is recommended after age 76. ?Pneumococcal polysaccharide (PPSV23) vaccine. One  dose is recommended after age 65. ?Talk to your  health care provider about which screenings and vaccines you need and how often you need them. ?This information is not intended to replace advice given to you by your health care provider. Make sure you discuss any questions you have with your health care provider. ?Document Released: 05/28/2015 Document Revised: 01/19/2016 Document Reviewed: 03/02/2015 ?Elsevier Interactive Patient Education ? 2017 [place]. ? ?Fall Prevention in the Home ?Falls can cause injuries. They can happen to people of all ages. There are many things you can do to make your home safe and to help prevent falls. ?What can I do on the outside of my home? ?Regularly fix the edges of walkways and driveways and fix any cracks. ?Remove anything that might make you trip as you walk through a door, such as a raised step or threshold. ?Trim any bushes or trees on the path to your home. ?Use bright outdoor lighting. ?Clear any walking paths of anything that might make someone trip, such as rocks or tools. ?Regularly check to see if handrails are loose or broken. Make sure that both sides of any steps have handrails. ?Any raised decks and porches should have guardrails on the edges. ?Have any leaves, snow, or ice cleared regularly. ?Use sand or salt on walking paths during winter. ?Clean up any spills in your garage right away. This includes oil or grease spills. ?What can I do in the bathroom? ?Use night lights. ?Install grab bars by the toilet and in the tub and shower. Do not use towel bars as grab bars. ?Use non-skid mats or decals in the tub or shower. ?If you need to sit down in the shower, use a plastic, non-slip stool. ?Keep the floor dry. Clean up any water that spills on the floor as soon as it happens. ?Remove soap buildup in the tub or shower regularly. ?Attach bath mats securely with double-sided non-slip rug tape. ?Do not have throw rugs and other things on the floor that can make you trip. ?What can I do in the bedroom? ?Use night  lights. ?Make sure that you have a light by your bed that is easy to reach. ?Do not use any sheets or blankets that are too big for your bed. They should not hang down onto the floor. ?Have a firm chair that has side arms. You can use this for support while you get dressed. ?Do not have throw rugs and other things on the floor that can make you trip. ?What can I do in the kitchen? ?Clean up any spills right away. ?Avoid walking on wet floors. ?Keep items that you use a lot in easy-to-reach places. ?If you need to reach something above you, use a strong step stool that has a grab bar. ?Keep electrical cords out of the way. ?Do not use floor polish or wax that makes floors slippery. If you must use wax, use non-skid floor wax. ?Do not have throw rugs and other things on the floor that can make you trip. ?What can I do with my stairs? ?Do not leave any items on the stairs. ?Make sure that there are handrails on both sides of the stairs and use them. Fix handrails that are broken or loose. Make sure that handrails are as long as the stairways. ?Check any carpeting to make sure that it is firmly attached to the stairs. Fix any carpet that is loose or worn. ?Avoid having throw rugs at the top or bottom of the  stairs. If you do have throw rugs, attach them to the floor with carpet tape. ?Make sure that you have a light switch at the top of the stairs and the bottom of the stairs. If you do not have them, ask someone to add them for you. ?What else can I do to help prevent falls? ?Wear shoes that: ?Do not have high heels. ?Have rubber bottoms. ?Are comfortable and fit you well. ?Are closed at the toe. Do not wear sandals. ?If you use a stepladder: ?Make sure that it is fully opened. Do not climb a closed stepladder. ?Make sure that both sides of the stepladder are locked into place. ?Ask someone to hold it for you, if possible. ?Clearly mark and make sure that you can see: ?Any grab bars or handrails. ?First and last  steps. ?Where the edge of each step is. ?Use tools that help you move around (mobility aids) if they are needed. These include: ?Canes. ?Walkers. ?Scooters. ?Crutches. ?Turn on the lights when you go into a dark area.

## 2021-10-26 ENCOUNTER — Telehealth: Payer: Self-pay | Admitting: Internal Medicine

## 2021-10-26 NOTE — Telephone Encounter (Signed)
Copied from [place] [id number]. Topic: General - Other >> Oct 26, 2021  4:22 PM Rudene Anda wrote: Reason for CRM: Pt called in regarding her upcoming appt 6/16, pt wanted to know about bloodwork and if she needs to fast, please advise.

## 2021-10-28 ENCOUNTER — Encounter: Payer: Self-pay | Admitting: Internal Medicine

## 2021-10-28 ENCOUNTER — Ambulatory Visit (INDEPENDENT_AMBULATORY_CARE_PROVIDER_SITE_OTHER): Payer: Medicare Other | Admitting: Internal Medicine

## 2021-10-28 ENCOUNTER — Ambulatory Visit (INDEPENDENT_AMBULATORY_CARE_PROVIDER_SITE_OTHER): Payer: Medicare Other

## 2021-10-28 VITALS — BP 130/70 | HR 75 | Ht 67.0 in | Wt 225.0 lb

## 2021-10-28 DIAGNOSIS — E118 Type 2 diabetes mellitus with unspecified complications: Secondary | ICD-10-CM

## 2021-10-28 DIAGNOSIS — I4819 Other persistent atrial fibrillation: Secondary | ICD-10-CM

## 2021-10-28 DIAGNOSIS — I428 Other cardiomyopathies: Secondary | ICD-10-CM | POA: Diagnosis not present

## 2021-10-28 DIAGNOSIS — M720 Palmar fascial fibromatosis [Dupuytren]: Secondary | ICD-10-CM | POA: Diagnosis not present

## 2021-10-28 DIAGNOSIS — E1169 Type 2 diabetes mellitus with other specified complication: Secondary | ICD-10-CM

## 2021-10-28 DIAGNOSIS — E785 Hyperlipidemia, unspecified: Secondary | ICD-10-CM | POA: Diagnosis not present

## 2021-10-28 DIAGNOSIS — I5022 Chronic systolic (congestive) heart failure: Secondary | ICD-10-CM | POA: Diagnosis not present

## 2021-10-28 LAB — POCT GLYCOSYLATED HEMOGLOBIN (HGB A1C): Hemoglobin A1C: 5.5 % (ref 4.0–5.6)

## 2021-10-28 LAB — ECHOCARDIOGRAM COMPLETE
Area-P 1/2: 3.91 cm2
Calc EF: 52 %
S' Lateral: 3.2 cm
Single Plane A2C EF: 53.1 %
Single Plane A4C EF: 50.9 %

## 2021-10-28 MED ORDER — ROSUVASTATIN CALCIUM 5 MG PO TABS: 5.0000 mg | ORAL_TABLET | ORAL | 3 refills | Status: DC

## 2021-10-28 MED ORDER — METOPROLOL TARTRATE 50 MG PO TABS: 25.0000 mg | ORAL_TABLET | Freq: Two times a day (BID) | ORAL | 0 refills | Status: DC

## 2021-10-28 NOTE — Progress Notes (Signed)
Date:  10/28/2021   Name:  Brandy [name]   DOB:  06-May-1956   MRN:  [id number]   Chief Complaint: Diabetes and Hypertension  Diabetes She presents for her follow-up diabetic visit. She has type 2 diabetes mellitus. Her disease course has been stable. Pertinent negatives for hypoglycemia include no headaches, nervousness/anxiousness or tremors. Pertinent negatives for diabetes include no chest pain, no fatigue, no polydipsia and no polyuria. Pertinent negatives for diabetic complications include no CVA. Current diabetic treatment includes oral agent (monotherapy) (metformin 250 mg).  Hypertension The problem is controlled. Pertinent negatives include no chest pain, headaches, palpitations or shortness of breath. Past treatments include diuretics and beta blockers. Hypertensive end-organ damage includes CAD/MI. There is no history of kidney disease or CVA.  Hyperlipidemia This is a chronic problem. Pertinent negatives include no chest pain or shortness of breath. Current antihyperlipidemic treatment includes statins (tolerating once a week statin).  Hand pain - esp base of right 4th finger.  Lab Results  Component Value Date   NA 137 08/01/2021   K 4.2 08/01/2021   CO2 24 08/01/2021   GLUCOSE 89 08/01/2021   BUN 14 08/01/2021   CREATININE 0.77 08/01/2021   CALCIUM 9.8 08/01/2021   EGFR 85 08/01/2021   GFRNONAA >60 11/16/2020   Lab Results  Component Value Date   CHOL 166 04/01/2021   HDL 43 04/01/2021   LDLCALC 103 (H) 04/01/2021   LDLDIRECT 131.0 10/31/2016   TRIG 112 04/01/2021   CHOLHDL 3.9 04/01/2021   Lab Results  Component Value Date   TSH 1.460 08/01/2021   Lab Results  Component Value Date   HGBA1C 5.6 08/01/2021   Lab Results  Component Value Date   WBC 6.5 11/16/2020   HGB 14.6 11/16/2020   HCT 42.7 11/16/2020   MCV 92.4 11/16/2020   PLT 267 11/16/2020   Lab Results  Component Value Date   ALT 18 04/01/2021   AST 19 04/01/2021   ALKPHOS 111 04/01/2021    BILITOT 0.4 04/01/2021   Lab Results  Component Value Date   VD25OH 38.71 10/31/2016     Review of Systems  Constitutional:  Negative for appetite change, fatigue, fever and unexpected weight change.  HENT:  Negative for tinnitus and trouble swallowing.   Eyes:  Negative for visual disturbance.  Respiratory:  Negative for cough, chest tightness and shortness of breath.   Cardiovascular:  Negative for chest pain, palpitations and leg swelling.  Gastrointestinal:  Negative for abdominal pain.  Endocrine: Negative for polydipsia and polyuria.  Genitourinary:  Negative for dysuria and hematuria.  Musculoskeletal:  Positive for arthralgias (right hand).  Neurological:  Negative for tremors, numbness and headaches.  Psychiatric/Behavioral:  Negative for dysphoric mood and sleep disturbance. The patient is not nervous/anxious.     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   Varicose veins of leg with pain, bilateral 07/06/2020   Acquired thrombophilia ([place]) 05/24/2020   Type II diabetes mellitus with complication ([place]) 68/03/5725   Gastric polyp    Colon polyps    Varicose veins of both lower extremities 10/20/2019   Dysphagia 10/20/2019   Osteoarthritis of right hand 10/31/2016   Nonischemic cardiomyopathy ([place]) 04/14/2016   BMI 33.0-33.9,adult 04/14/2016    No Known Allergies  Past Surgical History:  Procedure Laterality Date   ATRIAL FIBRILLATION ABLATION N/A 12/09/2020   Procedure: ATRIAL FIBRILLATION  ABLATION;  Surgeon: Vickie Epley, MD;  Location: [place] [facility];  Service: Cardiovascular;  Laterality: N/A;   BIOPSY N/A 01/12/2020   Procedure: BIOPSY;  Surgeon: Virgel Manifold, MD;  Location: [place];  Service: Endoscopy;  Laterality: N/A;   CARDIAC CATHETERIZATION N/A 04/17/2016   Procedure: Left Heart Cath and  Coronary Angiography;  Surgeon: Wellington Hampshire, MD;  Location: [place] [facility];  Service: Cardiovascular;  Laterality: N/A;   CARDIOVERSION N/A 10/22/2020   Procedure: CARDIOVERSION;  Surgeon: Nelva Bush, MD;  Location: [place] [facility];  Service: Cardiovascular;  Laterality: N/A;   CHOLECYSTECTOMY     COLON SURGERY  2021   COLONOSCOPY N/A 01/20/2020   Procedure: COLONOSCOPY;  Surgeon: Lesly Rubenstein, MD;  Location: [facility];  Service: Endoscopy;  Laterality: N/A;   COLONOSCOPY WITH PROPOFOL N/A 01/12/2020   Procedure: COLONOSCOPY WITH PROPOFOL;  Surgeon: Virgel Manifold, MD;  Location: [place];  Service: Endoscopy;  Laterality: N/A;  Diabetic - oral meds sleep apnea   ELECTROPHYSIOLOGIC STUDY N/A 05/26/2016   Procedure: Cardioversion;  Surgeon: Wellington Hampshire, MD;  Location: [facility];  Service: Cardiovascular;  Laterality: N/A;   ESOPHAGOGASTRODUODENOSCOPY (EGD) WITH PROPOFOL N/A 01/12/2020   Procedure: ESOPHAGOGASTRODUODENOSCOPY (EGD) WITH PROPOFOL;  Surgeon: Virgel Manifold, MD;  Location: [place];  Service: Endoscopy;  Laterality: N/A;   POLYPECTOMY N/A 01/12/2020   Procedure: POLYPECTOMY;  Surgeon: Virgel Manifold, MD;  Location: [place];  Service: Endoscopy;  Laterality: N/A;    Social History   Tobacco Use   Smoking status: Never   Smokeless tobacco: Never  Vaping Use   Vaping Use: Never used  Substance Use Topics   Alcohol use: Never   Drug use: Never     Medication list has been reviewed and updated.  Current Meds  Medication Sig   apixaban (ELIQUIS) 5 MG TABS tablet Take 1 tablet by mouth twice daily   CALCIUM PO Take by mouth daily.   Celery Seed OIL Using as needed to help with constipation   Cholecalciferol (VITAMIN D3) 1000 units CAPS Take 1,000 Units by mouth 2 (two) times daily.   furosemide (LASIX) 20 MG tablet Take 1 tablet (20 mg total) by mouth daily as needed for fluid or edema.   glucose blood  (ONETOUCH VERIO) test strip USE 1 STRIP TO CHECK GLUCOSE UP TO 4 TIMES DAILY AS DIRECTED   Lancets (ONETOUCH DELICA PLUS [id number]) MISC USE  1 TO CHECK GLUCOSE UP TO 4 TIMES DAILY AS DIRECTED   Lavender Oil OIL Apply 1 application topically daily as needed (stress).   Lemon Oil OIL Take 1 application by mouth daily as needed (add to water).   Lemongrass Oil OIL Apply 1 application topically daily as needed (multiple uses).   Magnesium Bisglycinate (MAG GLYCINATE) 100 MG TABS Take 270 mg by mouth in the morning, at noon, and at bedtime.   metFORMIN (GLUCOPHAGE-XR) 500 MG 24 hr tablet Take 1/2 tablet by mouth twice a day   metoprolol tartrate (LOPRESSOR) 50 MG tablet Take 0.5 tablets (25 mg total) by mouth 2 (two) times daily.   Multiple Vitamins-Minerals (MULTIVITAMIN WITH MINERALS) tablet Take 1 tablet by mouth daily.   Oil Base OIL Breathe oil as needed for congestion   OIL OF OREGANO PO Uses as needed   peppermint oil liquid Apply 1 application topically daily as needed (itchy/ cold symptoms).   rosuvastatin (CRESTOR) 5 MG tablet Take 1 tablet (5 mg total)  by mouth once a week.   Tea Tree Oil OIL Apply 1 application topically daily as needed (fungus).   vitamin B-12 (CYANOCOBALAMIN) 500 MCG tablet Take 500 mcg by mouth daily.   vitamin C (ASCORBIC ACID) 500 MG tablet Take 500 mg by mouth daily.        10/28/2021    1:36 PM 08/01/2021    2:53 PM 04/01/2021    9:50 AM 02/22/2021    1:51 PM  GAD 7 : Generalized Anxiety Score  Nervous, Anxious, on Edge 0 _0 Control/stop worrying 0 1 0 1  Worry too much - different things 0 1 0 1  Trouble relaxing 0 0 0 1  Restless 0 0 1 1  Easily annoyed or irritable 0 0 0 0  Afraid - awful might happen 0 0 0 1  Total GAD 7 Score 0 _1 Anxiety Difficulty Not difficult at all Not difficult at all Not difficult at all Not difficult at all       10/28/2021    1:36 PM  Depression screen PHQ 2/9  Decreased Interest 1  Down, Depressed, Hopeless  1  PHQ - 2 Score 2  Altered sleeping 0  Tired, decreased energy 1  Change in appetite 1  Feeling bad or failure about yourself  0  Trouble concentrating 0  Moving slowly or fidgety/restless 0  Suicidal thoughts 0  PHQ-9 Score 4  Difficult doing work/chores Not difficult at all    BP Readings from Last 3 Encounters:  10/28/21 130/70  08/01/21 122/68  04/28/21 128/70    Physical Exam Vitals and nursing note reviewed.  Constitutional:      General: She is not in acute distress.    Appearance: Normal appearance. She is well-developed.  HENT:     Head: Normocephalic and atraumatic.  Cardiovascular:     Rate and Rhythm: Normal rate and regular rhythm.  Pulmonary:     Effort: Pulmonary effort is normal. No respiratory distress.     Breath sounds: No wheezing or rhonchi.  Musculoskeletal:     Right hand: Tenderness (base of 4th finger on the palm - no nodule or thickening, no triggering) present.     Cervical back: Normal range of motion.     Right lower leg: No edema.     Left lower leg: No edema.  Lymphadenopathy:     Cervical: No cervical adenopathy.  Skin:    General: Skin is warm and dry.     Findings: No rash.  Neurological:     Mental Status: She is alert and oriented to person, place, and time.  Psychiatric:        Mood and Affect: Mood normal.        Behavior: Behavior normal.     Wt Readings from Last 3 Encounters:  10/28/21 225 lb (102.1 kg)  08/01/21 220 lb (99.8 kg)  04/28/21 220 lb (99.8 kg)    BP 130/70   Pulse 75   Ht _2  (1.702 m)   Wt 225 lb (102.1 kg)   SpO2 95%   BMI 35.24 kg/m   Assessment and Plan: 1. Type II diabetes mellitus with complication (HCC) Clinically stable by exam and report without s/s of hypoglycemia. DM complicated by hypertension and dyslipidemia. Tolerating medications well without side effects or other concerns.  Continue metformin 250 mg daily. - POCT HgB A1C  2. Hyperlipidemia associated with type 2 diabetes  mellitus (HCC) Tolerating Crestor once a week.  Will check labs next visit. - rosuvastatin (CRESTOR) 5 MG tablet; Take 1 tablet (5 mg total) by mouth once a week.  Dispense: 13 tablet; Refill: 3  3. Palmar fascial fibromatosis (dupuytren) Recommend tylenol tid and topical rubs If needed, can refer to Ortho hand specialist.   Partially dictated using Editor, commissioning. Any errors are unintentional.  Halina Maidens, MD [facility]  10/28/2021

## 2021-11-01 ENCOUNTER — Other Ambulatory Visit: Payer: Self-pay | Admitting: Internal Medicine

## 2021-11-03 ENCOUNTER — Ambulatory Visit: Payer: Medicare Other | Admitting: Internal Medicine

## 2021-12-02 ENCOUNTER — Ambulatory Visit: Payer: Medicare Other | Admitting: Internal Medicine

## 2021-12-02 ENCOUNTER — Encounter: Payer: Self-pay | Admitting: Internal Medicine

## 2021-12-02 VITALS — BP 118/64 | HR 81 | Ht 67.0 in | Wt 227.0 lb

## 2021-12-02 DIAGNOSIS — Z79899 Other long term (current) drug therapy: Secondary | ICD-10-CM

## 2021-12-02 DIAGNOSIS — I4819 Other persistent atrial fibrillation: Secondary | ICD-10-CM

## 2021-12-02 DIAGNOSIS — I5022 Chronic systolic (congestive) heart failure: Secondary | ICD-10-CM | POA: Diagnosis not present

## 2021-12-02 DIAGNOSIS — I428 Other cardiomyopathies: Secondary | ICD-10-CM | POA: Diagnosis not present

## 2021-12-02 NOTE — Patient Instructions (Signed)
Medication Instructions:  - Your physician recommends that you continue on your current medications as directed. Please refer to the Current Medication list given to you today.  *If you need a refill on your cardiac medications before your next appointment, please call your pharmacy*   Lab Work: - none ordered  If you have labs (blood work) drawn today and your tests are completely normal, you will receive your results only by: [place] (if you have MyChart) OR A paper copy in the mail If you have any lab test that is abnormal or we need to change your treatment, we will call you to review the results.   Testing/Procedures:   Loop Recorder Implant in September with Dr. Caryl Comes - Your physician has recommended that you have an Implantable Loop Recorder (LINQ device) placed.   1) Please shower the night before and morning of your procedure with an antibacterial soap (any brand). 2) You may eat a light breakfast/ lunch prior to coming in. 3) You may take all of your regular medications as normal the day of your procedure 4) You may drive yourself home from the procedure 5) If you have someone with you, they will be asked to step out of the room during the implant    Follow-Up: At [facility], you and your health needs are our priority.  As part of our continuing mission to provide you with exceptional heart care, we have created designated Provider Care Teams.  These Care Teams include your primary Cardiologist (physician) and Advanced Practice Providers (APPs -  Physician Assistants and Nurse Practitioners) who all work together to provide you with the care you need, when you need it.  We recommend signing up for the patient portal called "MyChart".  Sign up information is provided on this After Visit Summary.  MyChart is used to connect with patients for Virtual Visits (Telemedicine).  Patients are able to view lab/test results, encounter notes, upcoming appointments, etc.   Non-urgent messages can be sent to your provider as well.   To learn more about what you can do with MyChart, go to [email or web address].    Your next appointment:   September 2023   The format for your next appointment:   In Person  Provider:   Virl Axe, MD    Other Instructions N/a  Important Information About Sugar

## 2021-12-02 NOTE — Progress Notes (Signed)
Patient ID: Brandy [name], female   DOB: [birth year], 66 y.o.   MRN: [id number]      Patient Care Team: Glean Hess, MD as PCP - General (Internal Medicine) Deboraha Sprang, MD as Consulting Physician (Cardiology) Haig Prophet Hilton Cork, MD as Consulting Physician (Gastroenterology) Arelia Sneddon, OD (Optometry)   HPI  Brandy [name] is a 66 y.o. female Seen in follow-up for atrial fibrillation for which dofetilide was started 2/18 with spontaneous conversion.She had recurrent episodes of atrial fibrillation.  She was referred to Dr. [name] to consider ablation.  It was elected to defer.  The decision >> permanent atrial fibrillation but... Underwent catheter ablation with Dr. Daune Perch 7/22 (PVI + CFAE)  Nonischemic cardiomyopathy with persistent left ventricular dysfunction occurring in the context of her atrial fibrillation with rapid and poorly controlled rates and subsequent normalization    anticoagulation with Apixoban without bleeding; worries about brain bleeds as her husband died of a brain bleed The patient denies chest pain, shortness of breath, nocturnal dyspnea, orthopnea or peripheral edema.  There have been no   lightheadedness or syncope.   Occasional palpitations that are fleeting feels in her throat as a bubble      Thromboembolic risk factors ( HTN-1, CHF-1, Gender-1) for a CHADSVASc Score of  3    Date Cr K Mg Dig Hgb  3/18  0.76 3.9    14.5 (2/18)  4/18  0.76 4.2 2.0    5/18 0.83 4.9   15.1  2/19 0.84 4.1 2.1    10/19 0.96 4.5 2.3  13.6    9/21 0.78 3.7  0.7 9.4 >>10.7  6/22 0.77 4.2   14.6  3/23 0.77 4.2      DATE TEST EF   11/17 Echo  15-20 %   1/18 Echo  35 %   5/18 Echo  50-55% LA size-normal   8/20 Echo 60-65%   5/22 Echo 55-60%   6/23 Echo  55-60%        Past Medical History:  Diagnosis Date   Acute lower GI bleeding 01/19/2020   Arthritis    Atrial fibrillation with rapid ventricular response (HCC)    Chicken pox    Chronic systolic CHF (congestive  heart failure) ([place])    a. 03/2016 Echo: Ef 15-20%, diff HK, ant AK;  b. 05/2016 Echo: Ef 30-35%, diff HK, mildly dil LA/RA.   Diabetes mellitus without complication (HCC)    Dysrhythmia    GERD (gastroesophageal reflux disease)    Heart murmur    Hip discomfort    been going on for 40 years    History of kidney stones    Hyperlipidemia associated with type 2 diabetes mellitus ([place]) 05/12/2021   Hypertension    Moderate mitral regurgitation    a. 03/2016 Echo: mod MR in setting of LV dysfxn.   Motion sickness    car - back seat   NICM (nonischemic cardiomyopathy) ([place])    a. 03/2016 Echo: EF 15-20%, diff HK, ant AK, mod MR, mod dil LA, mildly dil RA;  b. 04/2016 Cath: nl cors;  c. 05/2016 Echo: EF 30-35%, diff HK.   Obesity    Obstructive sleep apnea    compliant with CPAP   Persistent atrial fibrillation ([place])    a. Dx 03/2016;  b. CHA2DS2VASc = 2-->Eliquis '5mg'$  BID;  b. 05/2016 Failed DCCV x 4.   Sleep apnea    Stomach irritation    Visit for monitoring Tikosyn therapy 07/24/2016  Past Surgical History:  Procedure Laterality Date   ATRIAL FIBRILLATION ABLATION N/A 12/09/2020   Procedure: ATRIAL FIBRILLATION ABLATION;  Surgeon: Vickie Epley, MD;  Location: [place] [facility];  Service: Cardiovascular;  Laterality: N/A;   BIOPSY N/A 01/12/2020   Procedure: BIOPSY;  Surgeon: Virgel Manifold, MD;  Location: [place];  Service: Endoscopy;  Laterality: N/A;   CARDIAC CATHETERIZATION N/A 04/17/2016   Procedure: Left Heart Cath and Coronary Angiography;  Surgeon: Wellington Hampshire, MD;  Location: [place] [facility];  Service: Cardiovascular;  Laterality: N/A;   CARDIOVERSION N/A 10/22/2020   Procedure: CARDIOVERSION;  Surgeon: Nelva Bush, MD;  Location: [place] [facility];  Service: Cardiovascular;  Laterality: N/A;   CHOLECYSTECTOMY     COLON SURGERY  2021   COLONOSCOPY N/A 01/20/2020   Procedure: COLONOSCOPY;  Surgeon: Lesly Rubenstein, MD;  Location: [facility];   Service: Endoscopy;  Laterality: N/A;   COLONOSCOPY WITH PROPOFOL N/A 01/12/2020   Procedure: COLONOSCOPY WITH PROPOFOL;  Surgeon: Virgel Manifold, MD;  Location: [place];  Service: Endoscopy;  Laterality: N/A;  Diabetic - oral meds sleep apnea   ELECTROPHYSIOLOGIC STUDY N/A 05/26/2016   Procedure: Cardioversion;  Surgeon: Wellington Hampshire, MD;  Location: [facility];  Service: Cardiovascular;  Laterality: N/A;   ESOPHAGOGASTRODUODENOSCOPY (EGD) WITH PROPOFOL N/A 01/12/2020   Procedure: ESOPHAGOGASTRODUODENOSCOPY (EGD) WITH PROPOFOL;  Surgeon: Virgel Manifold, MD;  Location: [place];  Service: Endoscopy;  Laterality: N/A;   POLYPECTOMY N/A 01/12/2020   Procedure: POLYPECTOMY;  Surgeon: Virgel Manifold, MD;  Location: [place];  Service: Endoscopy;  Laterality: N/A;    Current Outpatient Medications  Medication Sig Dispense Refill   apixaban (ELIQUIS) 5 MG TABS tablet Take 1 tablet by mouth twice daily 180 tablet 1   CALCIUM PO Take by mouth daily.     Celery Seed OIL Using as needed to help with constipation     Cholecalciferol (VITAMIN D3) 1000 units CAPS Take 1,000 Units by mouth 2 (two) times daily.     furosemide (LASIX) 20 MG tablet Take 1 tablet (20 mg total) by mouth daily as needed for fluid or edema. 30 tablet 0   glucose blood (ONETOUCH VERIO) test strip USE 1 STRIP TO CHECK GLUCOSE UP TO 4 TIMES DAILY AS DIRECTED 100 each 3   Lancets (ONETOUCH DELICA PLUS [id number]) MISC USE  1 TO CHECK GLUCOSE UP TO 4 TIMES DAILY AS DIRECTED 100 each 3   Lavender Oil OIL Apply 1 application topically daily as needed (stress).     Lemon Oil OIL Take 1 application by mouth daily as needed (add to water).     Lemongrass Oil OIL Apply 1 application topically daily as needed (multiple uses).     Magnesium Bisglycinate (MAG GLYCINATE) 100 MG TABS Take 270 mg by mouth in the morning, at noon, and at bedtime.     metFORMIN (GLUCOPHAGE-XR) 500 MG 24 hr tablet  Take 1/2 tablet by mouth twice a day 90 tablet 3   metoprolol tartrate (LOPRESSOR) 50 MG tablet Take 0.5 tablets (25 mg total) by mouth 2 (two) times daily. 90 tablet 0   Multiple Vitamins-Minerals (MULTIVITAMIN WITH MINERALS) tablet Take 1 tablet by mouth daily.     Oil Base OIL Breathe oil as needed for congestion     OIL OF OREGANO PO Uses as needed     peppermint oil liquid Apply 1 application topically daily as needed (itchy/ cold symptoms).  rosuvastatin (CRESTOR) 5 MG tablet Take 1 tablet (5 mg total) by mouth once a week. 13 tablet 3   Tea Tree Oil OIL Apply 1 application topically daily as needed (fungus).     vitamin B-12 (CYANOCOBALAMIN) 500 MCG tablet Take 500 mcg by mouth daily.     vitamin C (ASCORBIC ACID) 500 MG tablet Take 500 mg by mouth daily.      No current facility-administered medications for this visit.    No Known Allergies  Physical Exam: BP 118/64   Pulse 81   Ht '5\' 7"'$  (1.702 m)   Wt 227 lb (103 kg)   SpO2 95%   BMI 35.55 kg/m  Well developed and nourished in no acute distress HENT normal Neck supple with JVP-  flat   Clear Regular rate and rhythm, no murmurs or gallops Abd-soft with active BS No Clubbing cyanosis edema Skin-warm and dry A & Oriented  Grossly normal sensory and motor function  ECG sinus at 81 Interval 17/08/39 Nonspecific T wave changes Inverted P waves inferiorly and a positive P wave in aVR  Assessment and  Plan  Atrial fibrillation-persistent status post ablation/PVI-caf without recurrence   Cardiomyopathy-- nonischemic-rate related resolved  Hypertension   Sinus bradycardia ectopic atrial rhythm  Obesity resolving  Palpitations  No interval atrial fibrillation of which she is aware.  No longer on dofetilide no bleeding on apixaban. Lengthy discussion regarding alternatives for long-term anticoagulation and the role of monitoring.  Recent study suggested that the Apple technology was not sufficient.  Implantable  loop recorder I think would be better. I wonder whether palpitations or PVCs.  If we end up with an implantable loop recorder we can identify that.  I have also suggested that she take her pulse as sometimes they come in concert with each other and a pause will confirm the diagnosis.  Blood pressure is well controlled.  Continue the metoprolol 25 twice daily

## 2021-12-26 ENCOUNTER — Other Ambulatory Visit: Payer: Self-pay

## 2021-12-26 MED ORDER — APIXABAN 5 MG PO TABS: 5.0000 mg | ORAL_TABLET | Freq: Two times a day (BID) | ORAL | 1 refills | Status: DC

## 2021-12-26 NOTE — Telephone Encounter (Signed)
Prescription refill request for Eliquis received. Indication:Afib Last office visit:7/23 Scr:0.7 Age: 66 Weight:103 kg  Prescription refilled

## 2022-02-07 ENCOUNTER — Other Ambulatory Visit: Payer: Self-pay | Admitting: Internal Medicine

## 2022-02-07 ENCOUNTER — Ambulatory Visit: Payer: Medicare Other | Admitting: Internal Medicine

## 2022-02-07 NOTE — Telephone Encounter (Signed)
Requested Prescriptions  Pending Prescriptions Disp Refills  . metoprolol tartrate (LOPRESSOR) 50 MG tablet [Pharmacy Med Name: Metoprolol Tartrate 50 MG Oral Tablet] 90 tablet 0    Sig: Take 1/2 (one-half) tablet by mouth twice daily     Cardiovascular:  Beta Blockers Passed - 02/07/2022  7:24 AM      Passed - Last BP in normal range    BP Readings from Last 1 Encounters:  12/02/21 118/64         Passed - Last Heart Rate in normal range    Pulse Readings from Last 1 Encounters:  12/02/21 81         Passed - Valid encounter within last 6 months    Recent Outpatient Visits          3 months ago Type II diabetes mellitus with complication ([name])   [name] [facility] at [facility], Jesse Sans, MD   6 months ago Type II diabetes mellitus with complication [name])   [name], Jesse Sans, MD   10 months ago Type II diabetes mellitus with complication [name])   [name], Jesse Sans, MD   11 months ago Acute cystitis without hematuria   [name] [facility] at [name], Jesse Sans, MD   1 year ago Type II diabetes mellitus with complication [name])   [name] [facility] at [facility], Jesse Sans, MD      Future Appointments            In 2 days Deboraha Sprang, MD [place]. [place]   In 3 weeks Army Melia, Jesse Sans, MD [facility] at [name]

## 2022-02-09 ENCOUNTER — Encounter: Payer: Self-pay | Admitting: Internal Medicine

## 2022-02-09 ENCOUNTER — Ambulatory Visit: Payer: Medicare Other | Attending: Internal Medicine | Admitting: Internal Medicine

## 2022-02-09 VITALS — BP 108/62 | HR 62 | Ht 66.0 in | Wt 225.5 lb

## 2022-02-09 DIAGNOSIS — I5022 Chronic systolic (congestive) heart failure: Secondary | ICD-10-CM | POA: Diagnosis not present

## 2022-02-09 DIAGNOSIS — I428 Other cardiomyopathies: Secondary | ICD-10-CM

## 2022-02-09 DIAGNOSIS — I4819 Other persistent atrial fibrillation: Secondary | ICD-10-CM | POA: Diagnosis not present

## 2022-02-09 NOTE — Patient Instructions (Addendum)
Medication Instructions:  - Your physician has recommended you make the following change in your medication:   1) STOP Eliquis  *If you need a refill on your cardiac medications before your next appointment, please call your pharmacy*   Lab Work: - none ordered  If you have labs (blood work) drawn today and your tests are completely normal, you will receive your results only by: [place] (if you have MyChart) OR A paper copy in the mail If you have any lab test that is abnormal or we need to change your treatment, we will call you to review the results.   Testing/Procedures: - none ordered   Follow-Up: At [facility], you and your health needs are our priority.  As part of our continuing mission to provide you with exceptional heart care, we have created designated Provider Care Teams.  These Care Teams include your primary Cardiologist (physician) and Advanced Practice Providers (APPs -  Physician Assistants and Nurse Practitioners) who all work together to provide you with the care you need, when you need it.  We recommend signing up for the patient portal called "MyChart".  Sign up information is provided on this After Visit Summary.  MyChart is used to connect with patients for Virtual Visits (Telemedicine).  Patients are able to view lab/test results, encounter notes, upcoming appointments, etc.  Non-urgent messages can be sent to your provider as well.   To learn more about what you can do with MyChart, go to [email or web address].    Your next appointment:   1 year(s)  The format for your next appointment:   In Person  Provider:   Virl Axe, MD    Other Instructions  Post Loop Recorder implant instructions:  1) You may shower tomorrow night 2) You may remove your tegaderm (top) dressing on Day 4 post procedure:02/13/22 3) You may remove your steri strips on Day 6 (02/15/22) post procedure if they have not fallen off on their own. 4) If you have  any bleeding issues or concerns with your implant site after getting home, please call our [facility] directly at [phone].    Important Information About Sugar

## 2022-02-09 NOTE — Progress Notes (Signed)
Patient ID: Brandy [name], female   DOB: 1955-09-21, 66 y.o.   MRN: [id number]      Patient Care Team: Glean Hess, MD as PCP - General (Internal Medicine) Deboraha Sprang, MD as Consulting Physician (Cardiology) Haig Prophet Hilton Cork, MD as Consulting Physician (Gastroenterology) Arelia Sneddon, OD (Optometry)   HPI  BRADEE COMMON is a 66 y.o. female Seen in follow-up for atrial fibrillation for which dofetilide was started 2/18 with spontaneous conversion.She had recurrent episodes of atrial fibrillation.  She was referred to Dr. [name] to consider ablation.  It was elected to defer.  The decision >> permanent atrial fibrillation but... Underwent catheter ablation with Dr. Daune Perch 7/22 (PVI + CFAE)  Nonischemic cardiomyopathy with persistent left ventricular dysfunction occurring in the context of her atrial fibrillation with rapid and poorly controlled rates and subsequent normalization    Anticoagulation with Apixoban without bleeding; worries about brain bleeds as her husband died of a brain bleed.  Given this, she comes in today for loop recorder insertion for follow-up of her atrial fibrillation burden in light of recent report suggesting that the sensitivity of wearable technology is inadequate    The patient denies chest pain, shortness of breath, nocturnal dyspnea, orthopnea or peripheral edema.  There have been no palpitations, lightheadedness or syncope.        Thromboembolic risk factors ( HTN-1, CHF-1, Gender-1) for a CHADSVASc Score of  3    Date Cr K Mg Dig Hgb  3/18  0.76 3.9    14.5 (2/18)  4/18  0.76 4.2 2.0    5/18 0.83 4.9   15.1  2/19 0.84 4.1 2.1    10/19 0.96 4.5 2.3  13.6    9/21 0.78 3.7  0.7 9.4 >>10.7  6/22 0.77 4.2   14.6  3/23 0.77 4.2      DATE TEST EF   11/17 Echo  15-20 %   1/18 Echo  35 %   5/18 Echo  50-55% LA size-normal   8/20 Echo 60-65%   5/22 Echo 55-60%   6/23 Echo  55-60%        Past Medical History:  Diagnosis Date   Acute lower GI  bleeding 01/19/2020   Arthritis    Atrial fibrillation with rapid ventricular response (HCC)    Chicken pox    Chronic systolic CHF (congestive heart failure) ([place])    a. 03/2016 Echo: Ef 15-20%, diff HK, ant AK;  b. 05/2016 Echo: Ef 30-35%, diff HK, mildly dil LA/RA.   Diabetes mellitus without complication (HCC)    Dysrhythmia    GERD (gastroesophageal reflux disease)    Heart murmur    Hip discomfort    been going on for 40 years    History of kidney stones    Hyperlipidemia associated with type 2 diabetes mellitus ([place]) 05/12/2021   Hypertension    Moderate mitral regurgitation    a. 03/2016 Echo: mod MR in setting of LV dysfxn.   Motion sickness    car - back seat   NICM (nonischemic cardiomyopathy) ([place])    a. 03/2016 Echo: EF 15-20%, diff HK, ant AK, mod MR, mod dil LA, mildly dil RA;  b. 04/2016 Cath: nl cors;  c. 05/2016 Echo: EF 30-35%, diff HK.   Obesity    Obstructive sleep apnea    compliant with CPAP   Persistent atrial fibrillation ([place])    a. Dx 03/2016;  b. CHA2DS2VASc = 2-->Eliquis '5mg'$  BID;  b. 05/2016 Failed DCCV  x 4.   Sleep apnea    Stomach irritation    Visit for monitoring Tikosyn therapy 07/24/2016    Past Surgical History:  Procedure Laterality Date   ATRIAL FIBRILLATION ABLATION N/A 12/09/2020   Procedure: ATRIAL FIBRILLATION ABLATION;  Surgeon: Vickie Epley, MD;  Location: [place] [facility];  Service: Cardiovascular;  Laterality: N/A;   BIOPSY N/A 01/12/2020   Procedure: BIOPSY;  Surgeon: Virgel Manifold, MD;  Location: [place];  Service: Endoscopy;  Laterality: N/A;   CARDIAC CATHETERIZATION N/A 04/17/2016   Procedure: Left Heart Cath and Coronary Angiography;  Surgeon: Wellington Hampshire, MD;  Location: [place] [facility];  Service: Cardiovascular;  Laterality: N/A;   CARDIOVERSION N/A 10/22/2020   Procedure: CARDIOVERSION;  Surgeon: Nelva Bush, MD;  Location: [facility];  Service: Cardiovascular;  Laterality: N/A;    CHOLECYSTECTOMY     COLON SURGERY  2021   COLONOSCOPY N/A 01/20/2020   Procedure: COLONOSCOPY;  Surgeon: Lesly Rubenstein, MD;  Location: [facility];  Service: Endoscopy;  Laterality: N/A;   COLONOSCOPY WITH PROPOFOL N/A 01/12/2020   Procedure: COLONOSCOPY WITH PROPOFOL;  Surgeon: Virgel Manifold, MD;  Location: [place];  Service: Endoscopy;  Laterality: N/A;  Diabetic - oral meds sleep apnea   ELECTROPHYSIOLOGIC STUDY N/A 05/26/2016   Procedure: Cardioversion;  Surgeon: Wellington Hampshire, MD;  Location: [facility];  Service: Cardiovascular;  Laterality: N/A;   ESOPHAGOGASTRODUODENOSCOPY (EGD) WITH PROPOFOL N/A 01/12/2020   Procedure: ESOPHAGOGASTRODUODENOSCOPY (EGD) WITH PROPOFOL;  Surgeon: Virgel Manifold, MD;  Location: [place];  Service: Endoscopy;  Laterality: N/A;   POLYPECTOMY N/A 01/12/2020   Procedure: POLYPECTOMY;  Surgeon: Virgel Manifold, MD;  Location: [place];  Service: Endoscopy;  Laterality: N/A;    Current Outpatient Medications  Medication Sig Dispense Refill   apixaban (ELIQUIS) 5 MG TABS tablet Take 1 tablet (5 mg total) by mouth 2 (two) times daily. 180 tablet 1   CALCIUM PO Take by mouth daily.     Celery Seed OIL Using as needed to help with constipation     Cholecalciferol (VITAMIN D3) 1000 units CAPS Take 1,000 Units by mouth 2 (two) times daily.     furosemide (LASIX) 20 MG tablet Take 1 tablet (20 mg total) by mouth daily as needed for fluid or edema. 30 tablet 0   glucose blood (ONETOUCH VERIO) test strip USE 1 STRIP TO CHECK GLUCOSE UP TO 4 TIMES DAILY AS DIRECTED 100 each 3   Lancets (ONETOUCH DELICA PLUS [id number]) MISC USE  1 TO CHECK GLUCOSE UP TO 4 TIMES DAILY AS DIRECTED 100 each 3   Lavender Oil OIL Apply 1 application topically daily as needed (stress).     Lemon Oil OIL Take 1 application by mouth daily as needed (add to water).     Lemongrass Oil OIL Apply 1 application topically daily as needed  (multiple uses).     Magnesium Bisglycinate (MAG GLYCINATE) 100 MG TABS Take 270 mg by mouth in the morning, at noon, and at bedtime.     metFORMIN (GLUCOPHAGE-XR) 500 MG 24 hr tablet Take 1/2 tablet by mouth twice a day 90 tablet 3   metoprolol tartrate (LOPRESSOR) 50 MG tablet Take 1/2 (one-half) tablet by mouth twice daily 90 tablet 0   Multiple Vitamins-Minerals (MULTIVITAMIN WITH MINERALS) tablet Take 1 tablet by mouth daily.     Oil Base OIL Breathe oil as needed for congestion     OIL OF OREGANO PO  Uses as needed     peppermint oil liquid Apply 1 application topically daily as needed (itchy/ cold symptoms).     rosuvastatin (CRESTOR) 5 MG tablet Take 1 tablet (5 mg total) by mouth once a week. 13 tablet 3   Tea Tree Oil OIL Apply 1 application topically daily as needed (fungus).     vitamin B-12 (CYANOCOBALAMIN) 500 MCG tablet Take 500 mcg by mouth daily.     vitamin C (ASCORBIC ACID) 500 MG tablet Take 500 mg by mouth daily.      No current facility-administered medications for this visit.    No Known Allergies  Physical Exam: BP 108/62 (BP Location: Left Arm, Patient Position: Sitting, Cuff Size: Normal)   Pulse 62   Ht '5\' 6"'$  (1.676 m)   Wt 225 lb 8 oz (102.3 kg)   SpO2 98%   BMI 36.40 kg/m  Well developed and nourished in no acute distress HENT normal Neck supple with JVP-  flat   Clear Regular rate and rhythm, no murmurs or gallops Abd-soft with active BS No Clubbing cyanosis edema Skin-warm and dry A & Oriented  Grossly normal sensory and motor function  ECG sinus @ 62 19/08/40   Assessment and  Plan  Atrial fibrillation-persistent status post ablation/PVI-caf without recurrence   Cardiomyopathy-- nonischemic-rate related resolved  Hypertension   Sinus bradycardia ectopic atrial rhythm  Obesity resolving  Palpitations  No symptoms of afib.  Will proceed with ILR implantation which we will use to guide the resuming of Apixaban   so will stop  today  BP well controlled continue metoprolol   She will resume her multiple supplements  In the past, ACE/ARB limited by hypotension,  with normal LVEF - albeit recovered will hold off on resuming for now, although would probably favor low dose ARB at return visit    Pre op Dx atrial fib Post op Dx Same  Procedure  Loop Recorder implantation  After routine prep and drape of the left parasternal area, a small incision was created. A Med[employer]onic LINQ Reveal Loop Recorder  Serial Number  [id number] was inserted.    SteriStrip dressing was  applied.  The patient tolerated the procedure without apparent complication.  EBL < 10cc

## 2022-02-10 DIAGNOSIS — H5213 Myopia, bilateral: Secondary | ICD-10-CM | POA: Diagnosis not present

## 2022-02-10 LAB — HM DIABETES EYE EXAM

## 2022-02-11 DIAGNOSIS — H524 Presbyopia: Secondary | ICD-10-CM | POA: Diagnosis not present

## 2022-02-28 ENCOUNTER — Ambulatory Visit: Payer: Medicare Other | Admitting: Internal Medicine

## 2022-03-10 ENCOUNTER — Telehealth: Payer: Self-pay | Admitting: Internal Medicine

## 2022-03-10 ENCOUNTER — Ambulatory Visit
Admission: RE | Admit: 2022-03-10 | Discharge: 2022-03-10 | Disposition: A | Discharge status: Home / Self Care | Payer: Medicare Other | Source: Ambulatory Visit | Attending: Internal Medicine | Admitting: Internal Medicine

## 2022-03-10 ENCOUNTER — Ambulatory Visit: Payer: Self-pay | Admitting: *Deleted

## 2022-03-10 ENCOUNTER — Encounter: Payer: Self-pay | Admitting: Internal Medicine

## 2022-03-10 ENCOUNTER — Ambulatory Visit (INDEPENDENT_AMBULATORY_CARE_PROVIDER_SITE_OTHER): Payer: Medicare Other | Admitting: Internal Medicine

## 2022-03-10 ENCOUNTER — Other Ambulatory Visit
Admission: RE | Admit: 2022-03-10 | Discharge: 2022-03-10 | Disposition: A | Discharge status: Home / Self Care | Payer: Medicare Other | Source: Home / Self Care | Attending: Internal Medicine | Admitting: Internal Medicine

## 2022-03-10 VITALS — BP 124/76 | HR 73 | Ht 66.0 in | Wt 223.0 lb

## 2022-03-10 DIAGNOSIS — M25561 Pain in right knee: Secondary | ICD-10-CM | POA: Diagnosis not present

## 2022-03-10 NOTE — Telephone Encounter (Signed)
Summary: med question   Patient called in asking if she should still take her meds, while fasting before her appt on 11/06.       Patient in office visit now. Requesting information regarding if she can take medications prior to fasting labs. Please advise while patient at appt now.

## 2022-03-10 NOTE — Telephone Encounter (Signed)
   Chief Complaint: R knee pain Symptoms: pain in knee- worse Frequency: worse over last 2 weeks Pertinent Negatives: Patient denies chest pain, difficulty breathing, fever, calf pain Disposition: '[]'$ ED /'[]'$ Urgent Care (no appt availability in office) / '[x]'$ Appointment(In office/virtual)/ '[]'$  [place] [facility]/ '[]'$ Home Care/ '[]'$ Refused Recommended Disposition /'[]'$ [place] [facility]/ '[]'$  Follow-up with PCP Additional Notes:    Reason for Disposition  [1] MILD pain (e.g., does not interfere with normal activities) AND [2] present > 7 days  Answer Assessment - Initial Assessment Questions 1. LOCATION and RADIATION: "Where is the pain located?"      Right knee- outer knee- below knee 2. QUALITY: "What does the pain feel like?"  (e.g., sharp, dull, aching, burning)     Shooting burning pain 3. SEVERITY: "How bad is the pain?" "What does it keep you from doing?"   (Scale 1-10; or mild, moderate, severe)   -  MILD (1-3): doesn't interfere with normal activities    -  MODERATE (4-7): interferes with normal activities (e.g., work or school) or awakens from sleep, limping    -  SEVERE (8-10): excruciating pain, unable to do any normal activities, unable to walk     mild 4. ONSET: "When did the pain start?" "Does it come and go, or is it there all the time?"     Last couple weeks- worse- at least 2 months 5. RECURRENT: "Have you had this pain before?" If Yes, ask: "When, and what happened then?"     Left knee has bothered her in the past- but not right 6. SETTING: "Has there been any recent work, exercise or other activity that involved that part of the body?"      no 7. AGGRAVATING FACTORS: "What makes the knee pain worse?" (e.g., walking, climbing stairs, running)     Driving makes it worse 8. ASSOCIATED SYMPTOMS: "Is there any swelling or redness of the knee?"     no 9. OTHER SYMPTOMS: "Do you have any other symptoms?" (e.g., chest pain, difficulty breathing, fever, calf pain)     No- thigh  muscle gets tight  Protocols used: Knee Pain-A-AH

## 2022-03-10 NOTE — Progress Notes (Signed)
Date:  03/10/2022   Name:  Brandy [name]   DOB:  [birth year]   MRN:  [id number]   Chief Complaint: Knee Pain  Knee Pain  Incident onset: 2 months. There was no injury mechanism. The pain is present in the right knee. The quality of the pain is described as burning, shooting and aching. The pain has been Intermittent since onset. Associated symptoms include an inability to bear weight. Pertinent negatives include no numbness or tingling.    Lab Results  Component Value Date   NA 137 08/01/2021   K 4.2 08/01/2021   CO2 24 08/01/2021   GLUCOSE 89 08/01/2021   BUN 14 08/01/2021   CREATININE 0.77 08/01/2021   CALCIUM 9.8 08/01/2021   EGFR 85 08/01/2021   GFRNONAA >60 11/16/2020   Lab Results  Component Value Date   CHOL 166 04/01/2021   HDL 43 04/01/2021   LDLCALC 103 (H) 04/01/2021   LDLDIRECT 131.0 10/31/2016   TRIG 112 04/01/2021   CHOLHDL 3.9 04/01/2021   Lab Results  Component Value Date   TSH 1.460 08/01/2021   Lab Results  Component Value Date   HGBA1C 5.5 10/28/2021   Lab Results  Component Value Date   WBC 6.5 11/16/2020   HGB 14.6 11/16/2020   HCT 42.7 11/16/2020   MCV 92.4 11/16/2020   PLT 267 11/16/2020   Lab Results  Component Value Date   ALT 18 04/01/2021   AST 19 04/01/2021   ALKPHOS 111 04/01/2021   BILITOT 0.4 04/01/2021   Lab Results  Component Value Date   VD25OH 38.71 10/31/2016     Review of Systems  Constitutional:  Negative for chills, fatigue and fever.  Respiratory:  Negative for chest tightness and shortness of breath.   Cardiovascular:  Negative for chest pain.  Musculoskeletal:  Positive for arthralgias (right knee pain).  Neurological:  Negative for tingling and numbness.    Patient Active Problem List   Diagnosis Date Noted   Hyperlipidemia associated with type 2 diabetes mellitus ([place]) 05/12/2021   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   Varicose veins of leg with pain, bilateral 07/06/2020   Acquired  thrombophilia ([place]) 05/24/2020   Type II diabetes mellitus with complication ([place]) 76/54/6503   Gastric polyp    Colon polyps    Varicose veins of both lower extremities 10/20/2019   Dysphagia 10/20/2019   Osteoarthritis of right hand 10/31/2016   Nonischemic cardiomyopathy ([place]) 04/14/2016   BMI 33.0-33.9,adult 04/14/2016    No Known Allergies  Past Surgical History:  Procedure Laterality Date   ATRIAL FIBRILLATION ABLATION N/A 12/09/2020   Procedure: ATRIAL FIBRILLATION ABLATION;  Surgeon: Vickie Epley, MD;  Location: [place] [facility];  Service: Cardiovascular;  Laterality: N/A;   BIOPSY N/A 01/12/2020   Procedure: BIOPSY;  Surgeon: Virgel Manifold, MD;  Location: [place];  Service: Endoscopy;  Laterality: N/A;   CARDIAC CATHETERIZATION N/A 04/17/2016   Procedure: Left Heart Cath and Coronary Angiography;  Surgeon: Wellington Hampshire, MD;  Location: [place] [facility];  Service: Cardiovascular;  Laterality: N/A;   CARDIOVERSION N/A 10/22/2020   Procedure: CARDIOVERSION;  Surgeon: Nelva Bush, MD;  Location: [facility];  Service: Cardiovascular;  Laterality: N/A;   CHOLECYSTECTOMY     COLON SURGERY  2021   COLONOSCOPY N/A 01/20/2020   Procedure: COLONOSCOPY;  Surgeon: Lesly Rubenstein, MD;  Location: [facility];  Service: Endoscopy;  Laterality: N/A;   COLONOSCOPY WITH PROPOFOL N/A 01/12/2020   Procedure:  COLONOSCOPY WITH PROPOFOL;  Surgeon: Virgel Manifold, MD;  Location: [place];  Service: Endoscopy;  Laterality: N/A;  Diabetic - oral meds sleep apnea   ELECTROPHYSIOLOGIC STUDY N/A 05/26/2016   Procedure: Cardioversion;  Surgeon: Wellington Hampshire, MD;  Location: [facility];  Service: Cardiovascular;  Laterality: N/A;   ESOPHAGOGASTRODUODENOSCOPY (EGD) WITH PROPOFOL N/A 01/12/2020   Procedure: ESOPHAGOGASTRODUODENOSCOPY (EGD) WITH PROPOFOL;  Surgeon: Virgel Manifold, MD;  Location: [place];  Service: Endoscopy;  Laterality:  N/A;   POLYPECTOMY N/A 01/12/2020   Procedure: POLYPECTOMY;  Surgeon: Virgel Manifold, MD;  Location: [place];  Service: Endoscopy;  Laterality: N/A;    Social History   Tobacco Use   Smoking status: Never   Smokeless tobacco: Never  Vaping Use   Vaping Use: Never used  Substance Use Topics   Alcohol use: Never   Drug use: Never     Medication list has been reviewed and updated.  Current Meds  Medication Sig   CALCIUM PO Take by mouth daily.   Celery Seed OIL Using as needed to help with constipation   Cholecalciferol (VITAMIN D3) 1000 units CAPS Take 1,000 Units by mouth 2 (two) times daily.   furosemide (LASIX) 20 MG tablet Take 1 tablet (20 mg total) by mouth daily as needed for fluid or edema.   glucose blood (ONETOUCH VERIO) test strip USE 1 STRIP TO CHECK GLUCOSE UP TO 4 TIMES DAILY AS DIRECTED   Lancets (ONETOUCH DELICA PLUS [id number]) MISC USE  1 TO CHECK GLUCOSE UP TO 4 TIMES DAILY AS DIRECTED   Lavender Oil OIL Apply 1 application topically daily as needed (stress).   Lemon Oil OIL Take 1 application by mouth daily as needed (add to water).   Lemongrass Oil OIL Apply 1 application topically daily as needed (multiple uses).   Magnesium Bisglycinate (MAG GLYCINATE) 100 MG TABS Take 270 mg by mouth in the morning, at noon, and at bedtime.   metFORMIN (GLUCOPHAGE-XR) 500 MG 24 hr tablet Take 1/2 tablet by mouth twice a day   metoprolol tartrate (LOPRESSOR) 50 MG tablet Take 1/2 (one-half) tablet by mouth twice daily   Multiple Vitamins-Minerals (MULTIVITAMIN WITH MINERALS) tablet Take 1 tablet by mouth daily.   Oil Base OIL Breathe oil as needed for congestion   OIL OF OREGANO PO Uses as needed   peppermint oil liquid Apply 1 application topically daily as needed (itchy/ cold symptoms).   Tea Tree Oil OIL Apply 1 application topically daily as needed (fungus).   vitamin B-12 (CYANOCOBALAMIN) 500 MCG tablet Take 500 mcg by mouth daily.   vitamin C  (ASCORBIC ACID) 500 MG tablet Take 500 mg by mouth daily.        03/10/2022   11:15 AM 10/28/2021    1:36 PM 08/01/2021    2:53 PM 04/01/2021    9:50 AM  GAD 7 : Generalized Anxiety Score  Nervous, Anxious, on Edge 0 0 1 1  Control/stop worrying 0 0 1 0  Worry too much - different things 0 0 1 0  Trouble relaxing 0 0 0 0  Restless 0 0 0 1  Easily annoyed or irritable 0 0 0 0  Afraid - awful might happen 0 0 0 0  Total GAD 7 Score 0 0 3 2  Anxiety Difficulty Not difficult at all Not difficult at all Not difficult at all Not difficult at all       03/10/2022   11:15 AM 10/28/2021  1:36 PM 09/26/2021    3:45 PM  Depression screen PHQ 2/9  Decreased Interest 1 1 0  Down, Depressed, Hopeless 1 1 0  PHQ - 2 Score 2 2 0  Altered sleeping 2 0 0  Tired, decreased energy 1 1 0  Change in appetite 0 1 0  Feeling bad or failure about yourself  1 0 0  Trouble concentrating 0 0 1  Moving slowly or fidgety/restless 0 0 0  Suicidal thoughts 0 0 0  PHQ-9 Score _0 Difficult doing work/chores Somewhat difficult Not difficult at all Not difficult at all    BP Readings from Last 3 Encounters:  03/10/22 124/76  02/09/22 108/62  12/02/21 118/64    Physical Exam Vitals and nursing note reviewed.  Constitutional:      General: She is not in acute distress.    Appearance: She is well-developed.  HENT:     Head: Normocephalic and atraumatic.  Pulmonary:     Effort: Pulmonary effort is normal. No respiratory distress.  Musculoskeletal:     Right knee: Swelling (laterally) and effusion (small) present. No crepitus. Normal range of motion. Tenderness present over the LCL and patellar tendon. Normal patellar mobility.     Instability Tests: Anterior drawer test negative.     Comments: No instability on exam  Skin:    General: Skin is warm and dry.     Findings: No rash.  Neurological:     Mental Status: She is alert and oriented to person, place, and time.  Psychiatric:         Mood and Affect: Mood normal.        Behavior: Behavior normal.     Wt Readings from Last 3 Encounters:  03/10/22 223 lb (101.2 kg)  02/09/22 225 lb 8 oz (102.3 kg)  12/02/21 227 lb (103 kg)    BP 124/76 (BP Location: Left Arm, Patient Position: Sitting, Cuff Size: Normal)   Pulse 73   Ht 5' 6" (1.676 m)   Wt 223 lb (101.2 kg)   SpO2 96%   BMI 35.99 kg/m   Assessment and Plan: 1. Acute pain of right knee Recommend imaging and NSAID, ice and brace (Advil 400 mg tid) Will make further recommendations based on imaging and response to Advil. - DG Knee Complete 4 Views Right   Partially dictated using Editor, commissioning. Any errors are unintentional.  Halina Maidens, MD [facility]  03/10/2022

## 2022-03-10 NOTE — Telephone Encounter (Signed)
Called pt left VM to that she can take her medication before her appt. Sated she can have water or black coffee without cream or sugar. Name stated on VM.  KP

## 2022-03-10 NOTE — Patient Instructions (Signed)
Take Advil 200 mg - 2 three times a day.

## 2022-03-13 ENCOUNTER — Encounter (INDEPENDENT_AMBULATORY_CARE_PROVIDER_SITE_OTHER): Payer: Self-pay

## 2022-03-14 ENCOUNTER — Ambulatory Visit (INDEPENDENT_AMBULATORY_CARE_PROVIDER_SITE_OTHER): Payer: Medicare Other

## 2022-03-14 DIAGNOSIS — I428 Other cardiomyopathies: Secondary | ICD-10-CM

## 2022-03-15 LAB — CUP PACEART REMOTE DEVICE CHECK
Date Time Interrogation Session: 20231031161212
Implantable Pulse Generator Implant Date: 20230928

## 2022-03-20 ENCOUNTER — Encounter: Payer: Self-pay | Admitting: Internal Medicine

## 2022-03-20 ENCOUNTER — Ambulatory Visit (INDEPENDENT_AMBULATORY_CARE_PROVIDER_SITE_OTHER): Payer: Medicare Other | Admitting: Internal Medicine

## 2022-03-20 VITALS — BP 125/70 | HR 74 | Ht 66.0 in | Wt 227.4 lb

## 2022-03-20 DIAGNOSIS — E785 Hyperlipidemia, unspecified: Secondary | ICD-10-CM | POA: Diagnosis not present

## 2022-03-20 DIAGNOSIS — E118 Type 2 diabetes mellitus with unspecified complications: Secondary | ICD-10-CM | POA: Diagnosis not present

## 2022-03-20 DIAGNOSIS — E1169 Type 2 diabetes mellitus with other specified complication: Secondary | ICD-10-CM

## 2022-03-20 DIAGNOSIS — M25551 Pain in right hip: Secondary | ICD-10-CM

## 2022-03-20 DIAGNOSIS — I428 Other cardiomyopathies: Secondary | ICD-10-CM

## 2022-03-20 NOTE — Progress Notes (Signed)
Date:  03/20/2022   Name:  Brandy [name]   DOB:  05-27-1955   MRN:  [id number]   Chief Complaint: Diabetes  Diabetes She presents for her follow-up diabetic visit. She has type 2 diabetes mellitus. Her disease course has been stable. Pertinent negatives for hypoglycemia include no headaches or tremors. Pertinent negatives for diabetes include no chest pain, no fatigue, no polydipsia and no polyuria. Current diabetic treatment includes oral agent (monotherapy) (metformin). She is compliant with treatment all of the time. Her home blood glucose trend is decreasing steadily. Her breakfast blood glucose is taken between 6-7 am.  Hyperlipidemia This is a chronic problem. The problem is controlled. Pertinent negatives include no chest pain or shortness of breath. Current antihyperlipidemic treatment includes statins. The current treatment provides significant improvement of lipids.  She stopped the Crestor when her knee started hurting.  It is improved and she will resume the Crestor in about another month if her hip and knee are doing well.   Lab Results  Component Value Date   NA 137 08/01/2021   K 4.2 08/01/2021   CO2 24 08/01/2021   GLUCOSE 89 08/01/2021   BUN 14 08/01/2021   CREATININE 0.77 08/01/2021   CALCIUM 9.8 08/01/2021   EGFR 85 08/01/2021   GFRNONAA >60 11/16/2020   Lab Results  Component Value Date   CHOL 166 04/01/2021   HDL 43 04/01/2021   LDLCALC 103 (H) 04/01/2021   LDLDIRECT 131.0 10/31/2016   TRIG 112 04/01/2021   CHOLHDL 3.9 04/01/2021   Lab Results  Component Value Date   TSH 1.460 08/01/2021   Lab Results  Component Value Date   HGBA1C 5.5 10/28/2021   Lab Results  Component Value Date   WBC 6.5 11/16/2020   HGB 14.6 11/16/2020   HCT 42.7 11/16/2020   MCV 92.4 11/16/2020   PLT 267 11/16/2020   Lab Results  Component Value Date   ALT 18 04/01/2021   AST 19 04/01/2021   ALKPHOS 111 04/01/2021   BILITOT 0.4 04/01/2021   Lab Results  Component  Value Date   VD25OH 38.71 10/31/2016     Review of Systems  Constitutional:  Negative for appetite change, fatigue, fever and unexpected weight change.  HENT:  Negative for tinnitus and trouble swallowing.   Eyes:  Negative for visual disturbance.  Respiratory:  Negative for cough, chest tightness, shortness of breath and wheezing.   Cardiovascular:  Positive for palpitations (occasional PVC). Negative for chest pain and leg swelling.  Gastrointestinal:  Negative for abdominal pain.  Endocrine: Negative for polydipsia and polyuria.  Genitourinary:  Negative for dysuria and hematuria.  Musculoskeletal:  Negative for arthralgias.  Neurological:  Negative for tremors, numbness and headaches.  Psychiatric/Behavioral:  Negative for dysphoric mood.     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   Varicose veins of leg with pain, bilateral 07/06/2020   Acquired thrombophilia ([place]) 05/24/2020   Type II diabetes mellitus with complication ([place]) 78/29/5621   Gastric polyp    Colon polyps    Varicose veins of both lower extremities 10/20/2019   Dysphagia 10/20/2019   Osteoarthritis of right hand 10/31/2016   Nonischemic cardiomyopathy ([place]) 04/14/2016   BMI 33.0-33.9,adult 04/14/2016    No Known Allergies  Past Surgical History:  Procedure Laterality Date   ATRIAL FIBRILLATION ABLATION N/A 12/09/2020   Procedure: ATRIAL FIBRILLATION ABLATION;  Surgeon: Vickie Epley, MD;  Location: [facility];  Service: Cardiovascular;  Laterality: N/A;   BIOPSY N/A 01/12/2020   Procedure: BIOPSY;  Surgeon: Virgel Manifold, MD;  Location: [facility];  Service: Endoscopy;  Laterality: N/A;   CARDIAC CATHETERIZATION N/A 04/17/2016   Procedure: Left Heart Cath and Coronary Angiography;  Surgeon: Wellington Hampshire, MD;  Location: [facility];  Service: Cardiovascular;  Laterality: N/A;   CARDIOVERSION N/A 10/22/2020   Procedure: CARDIOVERSION;  Surgeon: Nelva Bush, MD;  Location: [facility];  Service: Cardiovascular;  Laterality: N/A;   CHOLECYSTECTOMY     COLON SURGERY  2021   COLONOSCOPY N/A 01/20/2020   Procedure: COLONOSCOPY;  Surgeon: Lesly Rubenstein, MD;  Location: [facility];  Service: Endoscopy;  Laterality: N/A;   COLONOSCOPY WITH PROPOFOL N/A 01/12/2020   Procedure: COLONOSCOPY WITH PROPOFOL;  Surgeon: Virgel Manifold, MD;  Location: [facility];  Service: Endoscopy;  Laterality: N/A;  Diabetic - oral meds sleep apnea   ELECTROPHYSIOLOGIC STUDY N/A 05/26/2016   Procedure: Cardioversion;  Surgeon: Wellington Hampshire, MD;  Location: [facility];  Service: Cardiovascular;  Laterality: N/A;   ESOPHAGOGASTRODUODENOSCOPY (EGD) WITH PROPOFOL N/A 01/12/2020   Procedure: ESOPHAGOGASTRODUODENOSCOPY (EGD) WITH PROPOFOL;  Surgeon: Virgel Manifold, MD;  Location: [facility];  Service: Endoscopy;  Laterality: N/A;   POLYPECTOMY N/A 01/12/2020   Procedure: POLYPECTOMY;  Surgeon: Virgel Manifold, MD;  Location: [facility];  Service: Endoscopy;  Laterality: N/A;    Social History   Tobacco Use   Smoking status: Never   Smokeless tobacco: Never  Vaping Use   Vaping Use: Never used  Substance Use Topics   Alcohol use: Never   Drug use: Never     Medication list has been reviewed and updated.  Current Meds  Medication Sig   CALCIUM PO Take by mouth daily.   Celery Seed OIL Using as needed to help with constipation   Cholecalciferol (VITAMIN D3) 1000 units CAPS Take 1,000 Units by mouth 2 (two) times daily.   furosemide (LASIX) 20 MG tablet Take 1 tablet (20 mg total) by mouth daily as needed for fluid or edema.   glucose blood (ONETOUCH VERIO) test strip USE 1 STRIP TO CHECK GLUCOSE UP TO 4 TIMES DAILY AS DIRECTED   Lancets (ONETOUCH DELICA PLUS [id number]) MISC USE  1 TO CHECK  GLUCOSE UP TO 4 TIMES DAILY AS DIRECTED   Lavender Oil OIL Apply 1 application topically daily as needed (stress).   Lemon Oil OIL Take 1 application by mouth daily as needed (add to water).   Lemongrass Oil OIL Apply 1 application topically daily as needed (multiple uses).   Magnesium Bisglycinate (MAG GLYCINATE) 100 MG TABS Take 270 mg by mouth in the morning, at noon, and at bedtime.   metFORMIN (GLUCOPHAGE-XR) 500 MG 24 hr tablet Take 1/2 tablet by mouth twice a day   metoprolol tartrate (LOPRESSOR) 50 MG tablet Take 1/2 (one-half) tablet by mouth twice daily   Multiple Vitamins-Minerals (MULTIVITAMIN WITH MINERALS) tablet Take 1 tablet by mouth daily.   Oil Base OIL Breathe oil as needed for congestion   OIL OF OREGANO PO Uses as needed   peppermint oil liquid Apply 1 application topically daily as needed (itchy/ cold symptoms).   Tea Tree Oil OIL Apply 1 application topically daily as needed (fungus).   vitamin B-12 (CYANOCOBALAMIN) 500 MCG tablet Take 500 mcg by mouth daily.   vitamin C (ASCORBIC ACID)  500 MG tablet Take 500 mg by mouth daily.        03/20/2022    9:35 AM 03/10/2022   11:15 AM 10/28/2021    1:36 PM 08/01/2021    2:53 PM  GAD 7 : Generalized Anxiety Score  Nervous, Anxious, on Edge 1 0 0 1  Control/stop worrying 0 0 0 1  Worry too much - different things 0 0 0 1  Trouble relaxing 0 0 0 0  Restless 0 0 0 0  Easily annoyed or irritable 0 0 0 0  Afraid - awful might happen 0 0 0 0  Total GAD 7 Score 1 0 0 3  Anxiety Difficulty Not difficult at all Not difficult at all Not difficult at all Not difficult at all       03/20/2022    9:35 AM 03/10/2022   11:15 AM 10/28/2021    1:36 PM  Depression screen PHQ 2/9  Decreased Interest 0 1 1  Down, Depressed, Hopeless 0 1 1  PHQ - 2 Score 0 2 2  Altered sleeping 1 2 0  Tired, decreased energy 0 1 1  Change in appetite 0 0 1  Feeling bad or failure about yourself  0 1 0  Trouble concentrating 0 0 0  Moving slowly  or fidgety/restless 0 0 0  Suicidal thoughts 0 0 0  PHQ-9 Score _0 Difficult doing work/chores Not difficult at all Somewhat difficult Not difficult at all    BP Readings from Last 3 Encounters:  03/20/22 136/74  03/10/22 124/76  02/09/22 108/62    Physical Exam Vitals and nursing note reviewed.  Constitutional:      General: She is not in acute distress.    Appearance: She is well-developed.  HENT:     Head: Normocephalic and atraumatic.  Neck:     Vascular: No carotid bruit.  Cardiovascular:     Rate and Rhythm: Normal rate and regular rhythm.     Heart sounds: No murmur heard. Pulmonary:     Effort: Pulmonary effort is normal. No respiratory distress.     Breath sounds: No wheezing or rhonchi.  Musculoskeletal:     Cervical back: Normal range of motion.     Right lower leg: No edema.     Left lower leg: No edema.  Lymphadenopathy:     Cervical: No cervical adenopathy.  Skin:    General: Skin is warm and dry.     Findings: No rash.  Neurological:     Mental Status: She is alert and oriented to person, place, and time.  Psychiatric:        Mood and Affect: Mood normal.        Behavior: Behavior normal.     Wt Readings from Last 3 Encounters:  03/20/22 227 lb 6.4 oz (103.1 kg)  03/10/22 223 lb (101.2 kg)  02/09/22 225 lb 8 oz (102.3 kg)    BP 136/74 (BP Location: Right Arm, Patient Position: Sitting, Cuff Size: Large)   Pulse 74   Ht _1  (1.676 m)   Wt 227 lb 6.4 oz (103.1 kg)   SpO2 96%   BMI 36.70 kg/m   Assessment and Plan: 1. Type II diabetes mellitus with complication (HCC) Clinically stable by exam and report without s/s of hypoglycemia. DM complicated by dyslipidemia. Tolerating medications well without side effects or other concerns. Can consider cutting metformin back to 250 mg once a day - Comprehensive metabolic panel - Hemoglobin A1c  2. Hyperlipidemia associated with type 2 diabetes mellitus ([place]) Should resume Crestor once a week  in December - Lipid panel  3. Nonischemic cardiomyopathy ([place]) Followed by Cardiology On metoprolol bid Has implantable loop recorder - no events in the last 90 days.     Partially dictated using Editor, commissioning. Any errors are unintentional.  Halina Maidens, MD [facility]  03/20/2022

## 2022-03-22 ENCOUNTER — Ambulatory Visit: Payer: Self-pay | Admitting: *Deleted

## 2022-03-22 LAB — COMPREHENSIVE METABOLIC PANEL
ALT: 14 IU/L (ref 0–32)
AST: 14 IU/L (ref 0–40)
Albumin/Globulin Ratio: 1.7 (ref 1.2–2.2)
Albumin: 4.7 g/dL (ref 3.9–4.9)
Alkaline Phosphatase: 94 IU/L (ref 44–121)
BUN/Creatinine Ratio: 18 (ref 12–28)
BUN: 14 mg/dL (ref 8–27)
Bilirubin Total: 0.4 mg/dL (ref 0.0–1.2)
CO2: 21 mmol/L (ref 20–29)
Calcium: 9.4 mg/dL (ref 8.7–10.3)
Chloride: 106 mmol/L (ref 96–106)
Creatinine, Ser: 0.79 mg/dL (ref 0.57–1.00)
Globulin, Total: 2.8 g/dL (ref 1.5–4.5)
Glucose: 96 mg/dL (ref 70–99)
Potassium: 4.5 mmol/L (ref 3.5–5.2)
Sodium: 143 mmol/L (ref 134–144)
Total Protein: 7.5 g/dL (ref 6.0–8.5)
eGFR: 82 mL/min/{1.73_m2} (ref >59)

## 2022-03-22 LAB — LIPID PANEL
Chol/HDL Ratio: 3.6 ratio (ref 0.0–4.4)
Cholesterol, Total: 166 mg/dL (ref 100–199)
HDL: 46 mg/dL (ref >39)
LDL Chol Calc (NIH): 97 mg/dL (ref 0–99)
Triglycerides: 127 mg/dL (ref 0–149)
VLDL Cholesterol Cal: 23 mg/dL (ref 5–40)

## 2022-03-22 LAB — HEMOGLOBIN A1C
Est. average glucose Bld gHb Est-mCnc: 120 mg/dL
Hgb A1c MFr Bld: 5.8 % — ABNORMAL HIGH (ref 4.8–5.6)

## 2022-03-22 NOTE — Telephone Encounter (Signed)
Reason for Disposition  [1] Follow-up call to recent contact AND [2] information only call, no triage required  Answer Assessment - Initial Assessment Questions 1. REASON FOR CALL or QUESTION: "What is your reason for calling today?" or "How can I best help you?" or "What question do you have that I can help answer?"     Pt returned call and was given her lab result message from Dr. Army Melia dated 03/22/2022 at 9:12 AM.  She did want to know if Dr. Army Melia wanted to make any changes to her metformin since her A1C was slightly higher than before.   During the OV they talked about reducing her dose but she's not sure she wants to do that since her level is elevated a litte. She is ok with a detailed voicemail being left.  Protocols used: Information Only Call - No Triage-A-AH

## 2022-03-22 NOTE — Telephone Encounter (Signed)
Patient informed to stay on same dose of metformin at this time.  - Brandy [name]

## 2022-03-27 ENCOUNTER — Ambulatory Visit (INDEPENDENT_AMBULATORY_CARE_PROVIDER_SITE_OTHER): Payer: Medicare Other | Admitting: Family Medicine

## 2022-03-27 ENCOUNTER — Ambulatory Visit: Payer: Self-pay

## 2022-03-27 ENCOUNTER — Encounter: Payer: Self-pay | Admitting: Family Medicine

## 2022-03-27 VITALS — BP 130/80 | HR 88 | Ht 66.0 in | Wt 226.0 lb

## 2022-03-27 DIAGNOSIS — M25552 Pain in left hip: Secondary | ICD-10-CM | POA: Insufficient documentation

## 2022-03-27 DIAGNOSIS — M7631 Iliotibial band syndrome, right leg: Secondary | ICD-10-CM | POA: Diagnosis not present

## 2022-03-27 DIAGNOSIS — M1711 Unilateral primary osteoarthritis, right knee: Secondary | ICD-10-CM | POA: Diagnosis not present

## 2022-03-27 DIAGNOSIS — M25551 Pain in right hip: Secondary | ICD-10-CM

## 2022-03-27 DIAGNOSIS — M170 Bilateral primary osteoarthritis of knee: Secondary | ICD-10-CM | POA: Insufficient documentation

## 2022-03-27 MED ORDER — DICLOFENAC SODIUM 50 MG PO TBEC
50.0000 mg | DELAYED_RELEASE_TABLET | Freq: Two times a day (BID) | ORAL | 0 refills | Status: DC
Start: 2022-03-27 — End: 2022-04-10

## 2022-03-27 NOTE — Telephone Encounter (Signed)
  Chief Complaint: right knee/ leg pain Symptoms: pain with walking, limping Frequency: since yesterday Pertinent Negatives: Patient denies swelling redness Disposition: '[]'$ ED /'[]'$ Urgent Care (no appt availability in office) / '[x]'$ Appointment(In office/virtual)/ '[]'$  [facility]/ '[]'$ Home Care/ '[]'$ Refused Recommended Disposition /'[]'$ [facility]/ '[]'$  Follow-up with PCP Additional Notes: pt has appt 0945 to see Dr. Zigmund Daniel Reason for Disposition  [1] MODERATE pain (e.g., interferes with normal activities, limping) AND [2] present > 3 days  Answer Assessment - Initial Assessment Questions 1. LOCATION and RADIATION: "Where is the pain located?"      Right leg  and knee pain 2. QUALITY: "What does the pain feel like?"  (e.g., sharp, dull, aching, burning)     *No Answer* 3. SEVERITY: "How bad is the pain?" "What does it keep you from doing?"   (Scale 1-10; or mild, moderate, severe)   -  MILD (1-3): doesn't interfere with normal activities    -  MODERATE (4-7): interferes with normal activities (e.g., work or school) or awakens from sleep, limping    -  SEVERE (8-10): excruciating pain, unable to do any normal activities, unable to walk     Sharp-sitting 2  walking to 4 if catch pain 10  4. ONSET: "When did the pain start?" "Does it come and go, or is it there all the time?"     yesterday 5. RECURRENT: "Have you had this pain before?" If Yes, ask: "When, and what happened then?"     no 6. SETTING: "Has there been any recent work, exercise or other activity that involved that part of the body?"      tripped 7. AGGRAVATING FACTORS: "What makes the knee pain worse?" (e.g., walking, climbing stairs, running)     *No Answer* 8. ASSOCIATED SYMPTOMS: "Is there any swelling or redness of the knee?"     no 9. OTHER SYMPTOMS: "Do you have any other symptoms?" (e.g., chest pain, difficulty breathing, fever, calf pain)     limping 10. PREGNANCY: "Is there any chance you are pregnant?"  "When was your last menstrual period?"       N/a  Protocols used: Knee Pain-A-AH

## 2022-03-27 NOTE — Assessment & Plan Note (Addendum)
Pain to the anterolateral knee ongoing for a few months, atraumatic in onset radiation proximally to the lateral hip, mildly distal paresthesias.  Denies any similar symptoms in the past, has had contralateral symptoms.  Examination with tenderness along the lateral patellar facet, medial joint line, flexion beyond 110 degrees recreates pain, no ligamentous laxity, McMurray's negative.  She has a positive Ober's test, positive Faber's testing, tenderness at the greater trochanteric region ipsilaterally.  Clinical history and findings are most consistent with patellofemoral osteoarthritis related arthralgia, associated IT band and greater trochanteric pain syndrome next.  I reviewed the same with the patient as well as treatment strategies, will start scheduled diclofenac twice daily, wraparound patellar stabilizing brace, hold from aggravating tasks, and maintain close follow-up in 2 weeks with low threshold to advance to corticosteroid injections.

## 2022-03-27 NOTE — Patient Instructions (Signed)
-   Start diclofenac, take twice daily until return - Use knee brace while on your feet - Can use Tylenol and ice for additional pain control - Return in 2 weeks

## 2022-03-27 NOTE — Progress Notes (Signed)
Primary Care / Sports Medicine Office Visit  Patient Information:  Patient ID: Brandy [name], female DOB: 06/08/1955 Age: 66 y.o. MRN: [id number]   Brandy [name] is a pleasant 66 y.o. female presenting with the following:  Chief Complaint  Patient presents with   Knee Pain    Right knee for a couple months. Is taking meloxicam that she doesn't know the dose    Vitals:   03/27/22 0956  BP: 130/80  Pulse: 88  SpO2: 97%   Vitals:   03/27/22 0956  Weight: 226 lb (102.5 kg)  Height: '5\' 6"'$  (1.676 m)   Body mass index is 36.48 kg/m.  CUP PACEART REMOTE DEVICE CHECK  Result Date: 03/15/2022 ILR summary report received. Battery status OK. Normal device function. No new symptom, tachy, brady, or pause episodes. No new AF episodes. Monthly summary reports and ROV/PRN LA  DG Knee Complete 4 Views Right  Result Date: 03/14/2022 CLINICAL DATA:  Right knee pain and swelling. EXAM: RIGHT KNEE - COMPLETE 4 VIEW COMPARISON:  None Available. FINDINGS: Mild degenerative changes are present at the patellofemoral joint, worse laterally. Knee is located. No acute osseous lesion is present. No significant joint effusion is present. IMPRESSION: Mild degenerative changes at the patellofemoral joint, worse laterally. Electronically Signed   By: San Morelle M.D.   On: 03/14/2022 07:08     Independent interpretation of notes and tests performed by another provider:   Independent interpretation of right knee x-rays dated 03/10/2022 reveals primarily patellofemoral degenerative changes with lateral patellar tilt, lateral osteophytes, there is secondary medial and lateral tibiofemoral space narrowing, no acute osseous processes noted.  Enthesophyte formation at the tibial tuberosity, quadriceps tendon insertion, superior patellar osteophyte noted.  Procedures performed:   None  Pertinent History, Exam, Impression, and Recommendations:   Problem List Items Addressed This Visit        Musculoskeletal and Integument   Primary osteoarthritis of right knee - Primary    Pain to the anterolateral knee ongoing for a few months, atraumatic in onset radiation proximally to the lateral hip, mildly distal paresthesias.  Denies any similar symptoms in the past, has had contralateral symptoms.  Examination with tenderness along the lateral patellar facet, medial joint line, flexion beyond 110 degrees recreates pain, no ligamentous laxity, McMurray's negative.  She has a positive Ober's test, positive Faber's testing, tenderness at the greater trochanteric region ipsilaterally.  Clinical history and findings are most consistent with patellofemoral osteoarthritis related arthralgia, associated IT band and greater trochanteric pain syndrome next.  I reviewed the same with the patient as well as treatment strategies, will start scheduled diclofenac twice daily, wraparound patellar stabilizing brace, hold from aggravating tasks, and maintain close follow-up in 2 weeks with low threshold to advance to corticosteroid injections.      Relevant Medications   diclofenac (VOLTAREN) 50 MG EC tablet   Greater 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   Relevant Medications   diclofenac (VOLTAREN) 50 MG EC tablet   It band syndrome, right   Relevant Medications   diclofenac (VOLTAREN) 50 MG EC tablet     Orders & Medications Meds ordered this encounter  Medications   diclofenac (VOLTAREN) 50 MG EC tablet    Sig: Take 1 tablet (50 mg total) by mouth 2 (two) times daily.    Dispense:  30 tablet    Refill:  0   No orders of the defined types were placed in this encounter.    Return  in about 2 weeks (around 04/10/2022).     Brandy Culver, MD   Primary Care Sports Medicine [place]

## 2022-03-28 NOTE — Progress Notes (Signed)
Care[facility] Summary Report / Loop Recorder 

## 2022-04-10 ENCOUNTER — Ambulatory Visit (INDEPENDENT_AMBULATORY_CARE_PROVIDER_SITE_OTHER): Payer: Medicare Other | Admitting: Family Medicine

## 2022-04-10 ENCOUNTER — Encounter: Payer: Self-pay | Admitting: Family Medicine

## 2022-04-10 DIAGNOSIS — M7631 Iliotibial band syndrome, right leg: Secondary | ICD-10-CM

## 2022-04-10 DIAGNOSIS — M1711 Unilateral primary osteoarthritis, right knee: Secondary | ICD-10-CM | POA: Diagnosis not present

## 2022-04-10 DIAGNOSIS — M25551 Pain in right hip: Secondary | ICD-10-CM

## 2022-04-10 MED ORDER — DICLOFENAC SODIUM 50 MG PO TBEC: 50.0000 mg | DELAYED_RELEASE_TABLET | Freq: Two times a day (BID) | ORAL | 0 refills | Status: DC | PRN

## 2022-04-10 NOTE — Patient Instructions (Signed)
-   Transition to as needed dosing of diclofenac - Referral coordinator will contact to schedule physical therapy - Return for follow-up as scheduled, contact for questions between now and then

## 2022-04-10 NOTE — Assessment & Plan Note (Signed)
Chronic condition the setting of ipsilateral right knee osteoarthritis and IT band syndrome, still symptomatic with focal tenderness, will plan greater trochanteric corticosteroid injection, formal PT referral placed.

## 2022-04-10 NOTE — Progress Notes (Signed)
     Primary Care / Sports Medicine Office Visit  Patient Information:  Patient ID: Brandy [name], female DOB: 12/19/1955 Age: 66 y.o. MRN: [id number]   Brandy [name] is a pleasant 66 y.o. female presenting with the following:  Chief Complaint  Patient presents with   Primary osteoarthritis of right knee    Feels like the pain has shifted. Medicaiton has helped a lot.     Vitals:   04/10/22 1333  BP: 128/82  Pulse: 68  SpO2: 98%   Vitals:   04/10/22 1333  Weight: 228 lb (103.4 kg)  Height: '5\' 6"'$  (1.676 m)   Body mass index is 36.8 kg/m.     Independent interpretation of notes and tests performed by another provider:   None  Procedures performed:   None  Pertinent History, Exam, Impression, and Recommendations:   Problem List Items Addressed This Visit       Musculoskeletal and Integument   Primary osteoarthritis of right knee    Interim improvement noted however she is still symptomatic primarily at the medial joint line, as such we discussed continued diclofenac dosing, transition to a as needed basis, close follow-up with low threshold to advance to intra-articular cortisone injection, and a referral to formal PT has been placed.      Relevant Medications   diclofenac (VOLTAREN) 50 MG EC tablet   Other Relevant Orders   Ambulatory referral to Physical Therapy   Greater 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    Chronic condition the setting of ipsilateral right knee osteoarthritis and IT band syndrome, still symptomatic with focal tenderness, will plan greater trochanteric corticosteroid injection, formal PT referral placed.      Relevant Medications   diclofenac (VOLTAREN) 50 MG EC tablet   Other Relevant Orders   Ambulatory referral to Physical Therapy   It band syndrome, right    See additional assessment(s) for plan details.      Relevant Medications   diclofenac (VOLTAREN) 50 MG EC tablet   Other Relevant Orders   Ambulatory referral to  Physical Therapy     Orders & Medications Meds ordered this encounter  Medications   diclofenac (VOLTAREN) 50 MG EC tablet    Sig: Take 1 tablet (50 mg total) by mouth 2 (two) times daily as needed.    Dispense:  60 tablet    Refill:  0   Orders Placed This Encounter  Procedures   Ambulatory referral to Physical Therapy     Return for 40 minute procedure slot.     Montel Culver, MD   Primary Care Sports Medicine [place]

## 2022-04-10 NOTE — Assessment & Plan Note (Signed)
Interim improvement noted however she is still symptomatic primarily at the medial joint line, as such we discussed continued diclofenac dosing, transition to a as needed basis, close follow-up with low threshold to advance to intra-articular cortisone injection, and a referral to formal PT has been placed.

## 2022-04-10 NOTE — Assessment & Plan Note (Signed)
See additional assessment(s) for plan details. 

## 2022-04-11 ENCOUNTER — Telehealth: Payer: Self-pay | Admitting: Internal Medicine

## 2022-04-11 NOTE — Telephone Encounter (Signed)
Copied from [facility] #[id number]. Topic: General - Other >> Apr 11, 2022  9:28 AM Everette C wrote: Reason for CRM: The patient would like to be ensured by a member of clinical staff from the practice that their upcoming injection from Dr. Zigmund Daniel for their knee/hip will not affect their diabetes  Please contact the patient further when possible for confirmation and further discussion

## 2022-04-11 NOTE — Telephone Encounter (Signed)
PC from pt, explained sugars do tend to run high after receiving a cortisone injection, to keep taking the same medications she is taking. Pt voiced understanding.

## 2022-04-11 NOTE — Telephone Encounter (Signed)
LVM for pt to call back.

## 2022-04-17 ENCOUNTER — Ambulatory Visit (INDEPENDENT_AMBULATORY_CARE_PROVIDER_SITE_OTHER): Payer: Medicare Other

## 2022-04-17 DIAGNOSIS — I4819 Other persistent atrial fibrillation: Secondary | ICD-10-CM | POA: Diagnosis not present

## 2022-04-17 LAB — CUP PACEART REMOTE DEVICE CHECK
Date Time Interrogation Session: 20231203230744
Implantable Pulse Generator Implant Date: 20230928

## 2022-04-18 ENCOUNTER — Encounter: Payer: Self-pay | Admitting: Family Medicine

## 2022-04-18 ENCOUNTER — Ambulatory Visit (INDEPENDENT_AMBULATORY_CARE_PROVIDER_SITE_OTHER): Payer: Medicare Other | Admitting: Family Medicine

## 2022-04-18 ENCOUNTER — Inpatient Hospital Stay (INDEPENDENT_AMBULATORY_CARE_PROVIDER_SITE_OTHER): Payer: Medicare Other | Admitting: Radiology

## 2022-04-18 VITALS — BP 120/68 | HR 68 | Ht 66.0 in | Wt 227.0 lb

## 2022-04-18 DIAGNOSIS — M25551 Pain in right hip: Secondary | ICD-10-CM | POA: Diagnosis not present

## 2022-04-18 DIAGNOSIS — M7631 Iliotibial band syndrome, right leg: Secondary | ICD-10-CM | POA: Diagnosis not present

## 2022-04-18 DIAGNOSIS — M1711 Unilateral primary osteoarthritis, right knee: Secondary | ICD-10-CM | POA: Diagnosis not present

## 2022-04-18 DIAGNOSIS — M7051 Other bursitis of knee, right knee: Secondary | ICD-10-CM

## 2022-04-18 MED ORDER — TRIAMCINOLONE ACETONIDE 40 MG/ML IJ SUSP
120.0000 mg | Freq: Once | INTRAMUSCULAR | Status: AC
  Administered 2022-04-18: 120 mg via INTRAMUSCULAR

## 2022-04-18 NOTE — Patient Instructions (Addendum)
You have just been given a cortisone injection to reduce pain and inflammation. After the injection you may notice immediate relief of pain as a result of the Lidocaine. It is important to rest the area of the injection for 24 to 48 hours after the injection. There is a possibility of some temporary increased discomfort and swelling for up to 72 hours until the cortisone begins to work. If you do have pain, simply rest the joint and use ice. If you can tolerate over the counter medications, you can try Tylenol, Aleve, or Advil for added relief per package instructions. - Relative rest x 2 days then as-needed - Use diclofenac twice daily as-needed - Return in 3 months

## 2022-04-19 DIAGNOSIS — M7051 Other bursitis of knee, right knee: Secondary | ICD-10-CM | POA: Insufficient documentation

## 2022-04-19 NOTE — Assessment & Plan Note (Signed)
Returns for follow-up to right knee arthralgia in the setting of osteoarthritis, has noted some improvement with diclofenac. Examination with tenderness at the medial joint line, pes bursa which is considered secondary and compensatory. Given persistent symptoms, she did elect to proceed with ultrasound-guided cortisone injections at the right knee joint and pes bursa. Post-care reviewed, will consider PT. Advised discontinuation of diclofenac after symptoms respond to cortisone.

## 2022-04-19 NOTE — Progress Notes (Signed)
Primary Care / Sports Medicine Office Visit  Patient Information:  Patient ID: RUQAYA STRAUSS, female DOB: 1955-08-07 Age: 66 y.o. MRN: [id number]   EMALIE MCWETHY is a pleasant 66 y.o. female presenting with the following:  Chief Complaint  Patient presents with   Greater 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    Vitals:   04/18/22 0951  BP: 120/68  Pulse: 68  SpO2: 99%   Vitals:   04/18/22 0951  Weight: 227 lb (103 kg)  Height: '5\' 6"'$  (1.676 m)   Body mass index is 36.64 kg/m.     Independent interpretation of notes and tests performed by another provider:   None  Procedures performed:   Procedure:  Injection of right greater trochanter under ultrasound guidance. Ultrasound guidance utilized for out-of-plane injection, no sonographic derangement noted Samsung HS60 device utilized with permanent recording / reporting. Verbal informed consent obtained and verified. Skin prepped in a sterile fashion. Ethyl chloride for topical local analgesia.  Completed without difficulty and tolerated well. Medication: triamcinolone acetonide 40 mg/mL suspension for injection 1 mL total and 2 mL lidocaine 1% without epinephrine utilized for needle placement anesthetic Advised to contact for fevers/chills, erythema, induration, drainage, or persistent bleeding.  Procedure:  Injection of right knee intraarticular under ultrasound guidance. Ultrasound guidance utilized for out-of-plane injection, joint line visualized, no effusion Samsung HS60 device utilized with permanent recording / reporting. Verbal informed consent obtained and verified. Skin prepped in a sterile fashion. Ethyl chloride for topical local analgesia.  Completed without difficulty and tolerated well. Medication: triamcinolone acetonide 40 mg/mL suspension for injection 1 mL total and 2 mL lidocaine 1% without epinephrine utilized for needle placement anesthetic Advised to contact for fevers/chills,  erythema, induration, drainage, or persistent bleeding.  Procedure:  Injection of right pes bursa under ultrasound guidance. Ultrasound guidance utilized for out-of-plane approach Samsung HS60 device utilized with permanent recording / reporting. Verbal informed consent obtained and verified. Skin prepped in a sterile fashion. Ethyl chloride for topical local analgesia.  Completed without difficulty and tolerated well. Medication: triamcinolone acetonide 40 mg/mL suspension for injection 1 mL total and 2 mL lidocaine 1% without epinephrine utilized for needle placement anesthetic Advised to contact for fevers/chills, erythema, induration, drainage, or persistent bleeding.   Pertinent History, Exam, Impression, and Recommendations:   Problem List Items Addressed This Visit       Musculoskeletal and Integument   Primary osteoarthritis of right knee - Primary    Returns for follow-up to right knee arthralgia in the setting of osteoarthritis, has noted some improvement with diclofenac. Examination with tenderness at the medial joint line, pes bursa which is considered secondary and compensatory. Given persistent symptoms, she did elect to proceed with ultrasound-guided cortisone injections at the right knee joint and pes bursa. Post-care reviewed, will consider PT. Advised discontinuation of diclofenac after symptoms respond to cortisone. Return in 3 months.      Relevant Orders   [place] LIMITED JOINT SPACE STRUCTURES LOW RIGHT   Greater 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    Persistent symptomatology despite PRN diclofenac, given persistence, she did elect to proceed with ultrasound guided right greater trochanter cortisone injection. Post-care reviewed, advised discontinuation of diclofenac as symptoms respond to cortisone. PT referral placed at last visit, she will consider starting this in several weeks. Return in 3 months.      Relevant Orders   [place] LIMITED JOINT SPACE  STRUCTURES LOW RIGHT   Pes anserinus bursitis of right  knee    See additional assessment(s) for plan details.      Relevant Orders   [place] LIMITED JOINT SPACE STRUCTURES LOW RIGHT     Orders & Medications Meds ordered this encounter  Medications   triamcinolone acetonide (KENALOG-40) injection 120 mg   Orders Placed This Encounter  Procedures   [place] LIMITED JOINT SPACE STRUCTURES LOW RIGHT     Return in about 3 months (around 07/18/2022).     Montel Culver, MD, [facility]   Primary Care Sports Medicine Primary Care and Sports Medicine at [facility]

## 2022-04-19 NOTE — Assessment & Plan Note (Signed)
See additional assessment(s) for plan details. 

## 2022-04-19 NOTE — Assessment & Plan Note (Addendum)
Persistent symptomatology despite PRN diclofenac, given persistence, she did elect to proceed with ultrasound guided right greater trochanter cortisone injection. Post-care reviewed, advised discontinuation of diclofenac as symptoms respond to cortisone. PT referral placed at last visit, she will consider starting this in several weeks. Return in 3 months.

## 2022-04-21 IMAGING — CR DG ABDOMEN ACUTE W/ 1V CHEST
4 series · 4 of 4 positions shown · non-contrast
Comparison: None.

CLINICAL DATA: Rectal bleeding after colonoscopy, evaluate for free
air

EXAM:
DG ABDOMEN ACUTE W/ 1V CHEST

[chest pa]
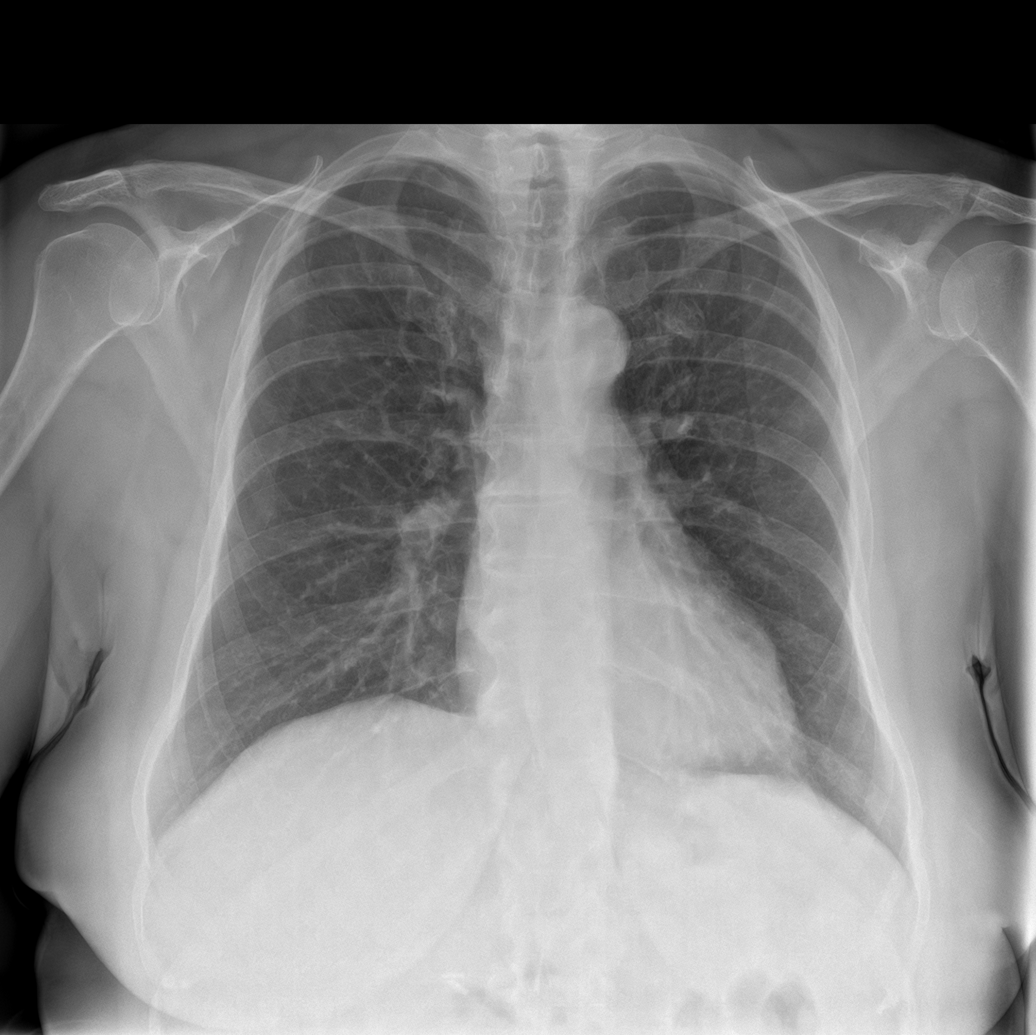

[abdomen erect]
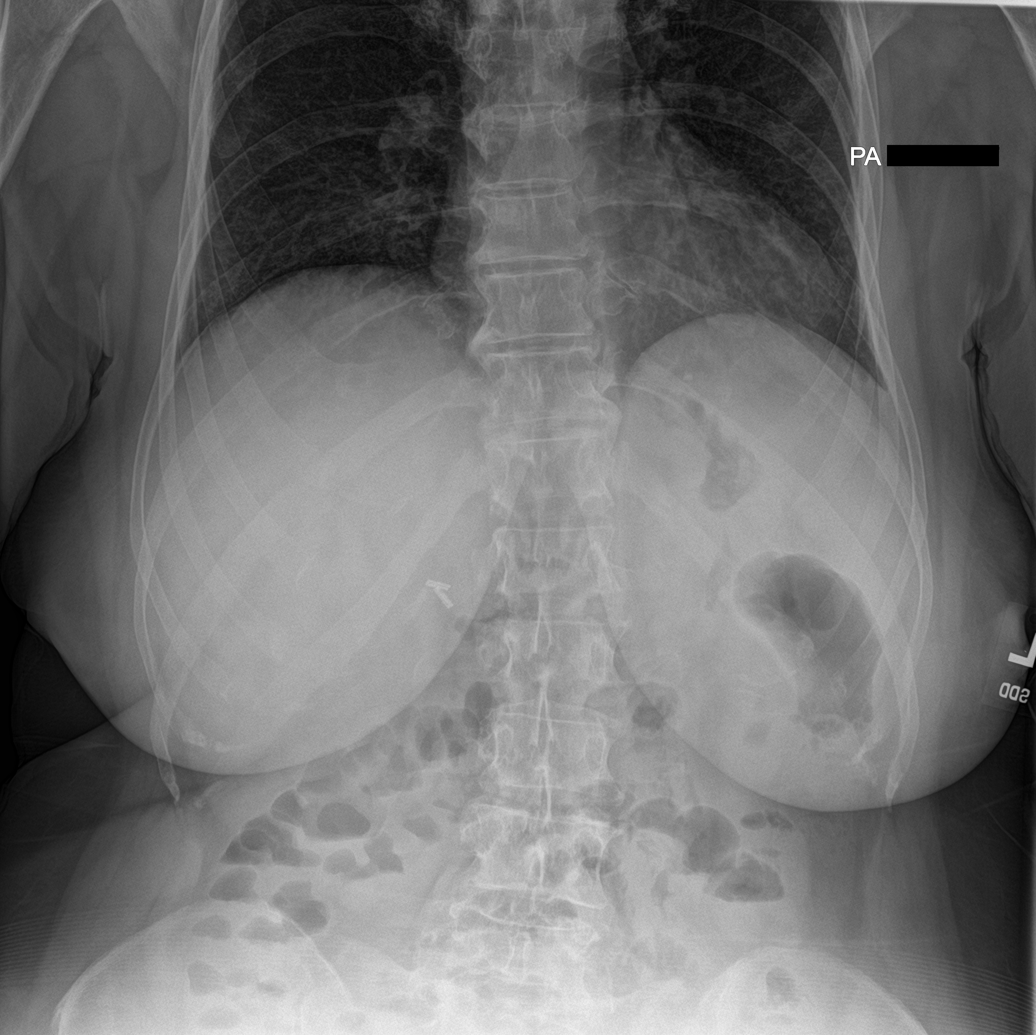

[abdomen kub (1 of 2)]
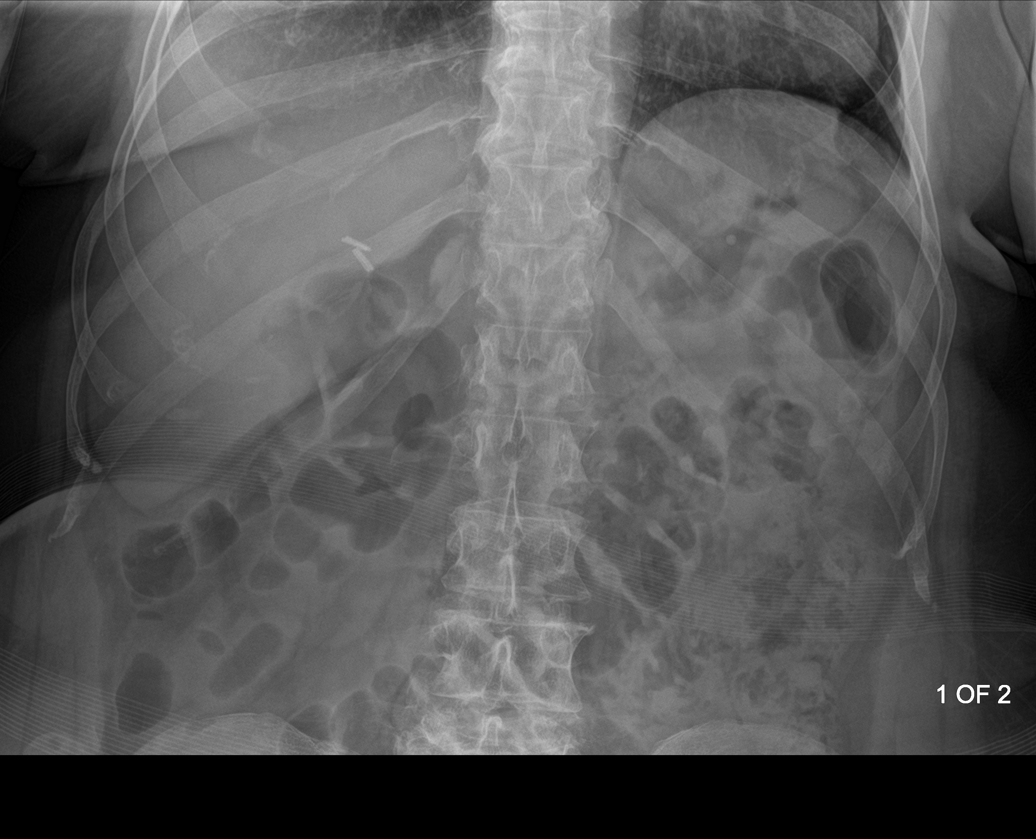

[abdomen kub (2 of 2)]
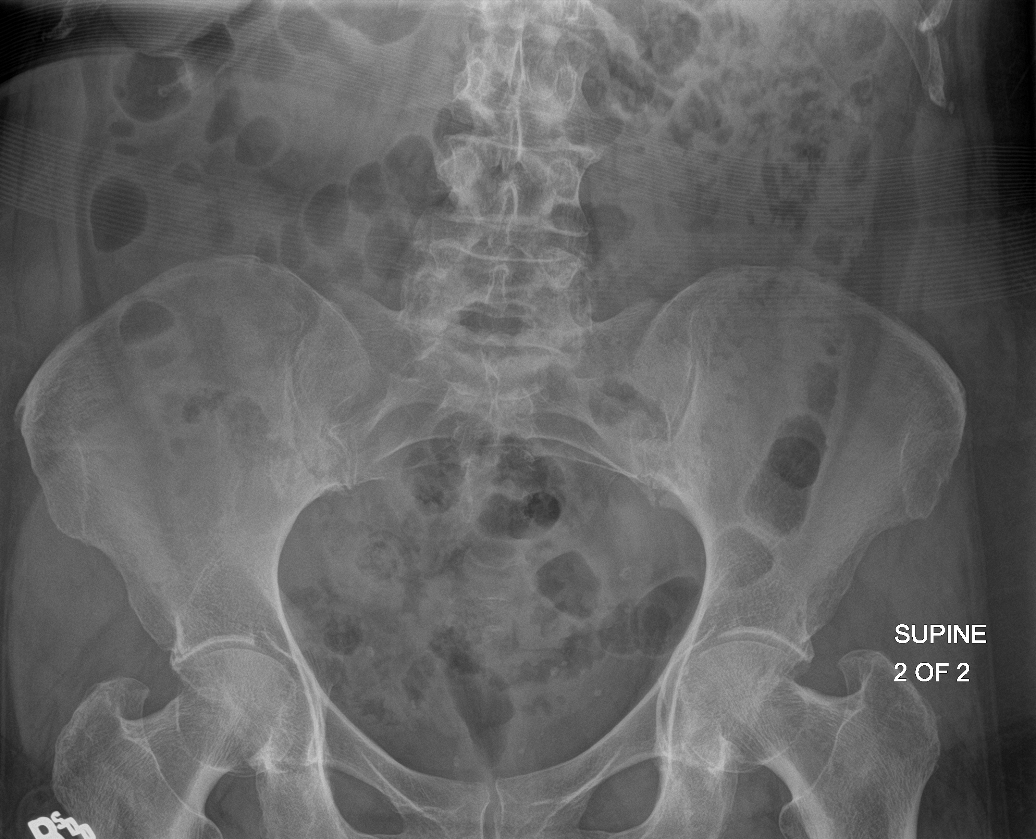

[4 of 4 positions shown; findings below may reference images not displayed]

FINDINGS: Lungs are clear.  No pleural effusion or pneumothorax.

The heart is normal in size.

Nonobstructive bowel gas pattern.

No evidence of free air under the diaphragm on the upright view.

Cholecystectomy clips.

Degenerative changes of the lumbar spine.

Visualized bony pelvis appears intact.
IMPRESSION: No evidence of acute cardiopulmonary disease.

No evidence of small bowel obstruction or free air.

## 2022-05-01 ENCOUNTER — Other Ambulatory Visit: Payer: Self-pay | Admitting: Internal Medicine

## 2022-05-01 DIAGNOSIS — E118 Type 2 diabetes mellitus with unspecified complications: Secondary | ICD-10-CM

## 2022-05-11 ENCOUNTER — Ambulatory Visit: Payer: Medicare Other | Attending: Family Medicine

## 2022-05-11 DIAGNOSIS — M7631 Iliotibial band syndrome, right leg: Secondary | ICD-10-CM | POA: Insufficient documentation

## 2022-05-11 DIAGNOSIS — M25551 Pain in right hip: Secondary | ICD-10-CM | POA: Diagnosis not present

## 2022-05-11 DIAGNOSIS — M1711 Unilateral primary osteoarthritis, right knee: Secondary | ICD-10-CM | POA: Insufficient documentation

## 2022-05-11 DIAGNOSIS — M25561 Pain in right knee: Secondary | ICD-10-CM | POA: Diagnosis not present

## 2022-05-11 DIAGNOSIS — G8929 Other chronic pain: Secondary | ICD-10-CM | POA: Insufficient documentation

## 2022-05-11 NOTE — Therapy (Signed)
OUTPATIENT PHYSICAL THERAPY KNEE EVALUATION   Patient Name: Brandy [name] MRN: [id number] DOB:1955/12/24, 66 y.o., female Today's Date: 05/11/2022  END OF SESSION:  PT End of Session - 05/11/22 1128     Visit Number 1    Number of Visits 17    Date for PT Re-Evaluation 07/06/22    Authorization Type eval: 05/11/22    PT Start Time 1145    PT Stop Time 1230    PT Time Calculation (min) 45 min    Activity Tolerance Patient tolerated treatment well    Behavior During Therapy [facility] for tasks assessed/performed            Past Medical History:  Diagnosis Date   Acute lower GI bleeding 01/19/2020   Arthritis    Atrial fibrillation with rapid ventricular response (HCC)    Chicken pox    Chronic systolic CHF (congestive heart failure) ([place])    a. 03/2016 Echo: Ef 15-20%, diff HK, ant AK;  b. 05/2016 Echo: Ef 30-35%, diff HK, mildly dil LA/RA.   Diabetes mellitus without complication (HCC)    Dysrhythmia    GERD (gastroesophageal reflux disease)    Heart murmur    Hip discomfort    been going on for 40 years    History of kidney stones    Hyperlipidemia associated with type 2 diabetes mellitus ([place]) 05/12/2021   Hypertension    Moderate mitral regurgitation    a. 03/2016 Echo: mod MR in setting of LV dysfxn.   Motion sickness    car - back seat   NICM (nonischemic cardiomyopathy) ([place])    a. 03/2016 Echo: EF 15-20%, diff HK, ant AK, mod MR, mod dil LA, mildly dil RA;  b. 04/2016 Cath: nl cors;  c. 05/2016 Echo: EF 30-35%, diff HK.   Obesity    Obstructive sleep apnea    compliant with CPAP   Persistent atrial fibrillation ([place])    a. Dx 03/2016;  b. CHA2DS2VASc = 2-->Eliquis '5mg'$  BID;  b. 05/2016 Failed DCCV x 4.   Sleep apnea    Stomach irritation    Visit for monitoring Tikosyn therapy 07/24/2016   Past Surgical History:  Procedure Laterality Date   ATRIAL FIBRILLATION ABLATION N/A 12/09/2020   Procedure: ATRIAL FIBRILLATION ABLATION;  Surgeon: Vickie Epley, MD;   Location: [place] [facility];  Service: Cardiovascular;  Laterality: N/A;   BIOPSY N/A 01/12/2020   Procedure: BIOPSY;  Surgeon: Virgel Manifold, MD;  Location: [place];  Service: Endoscopy;  Laterality: N/A;   CARDIAC CATHETERIZATION N/A 04/17/2016   Procedure: Left Heart Cath and Coronary Angiography;  Surgeon: Wellington Hampshire, MD;  Location: [place] [facility];  Service: Cardiovascular;  Laterality: N/A;   CARDIOVERSION N/A 10/22/2020   Procedure: CARDIOVERSION;  Surgeon: Nelva Bush, MD;  Location: [place] [facility];  Service: Cardiovascular;  Laterality: N/A;   CHOLECYSTECTOMY     COLON SURGERY  2021   COLONOSCOPY N/A 01/20/2020   Procedure: COLONOSCOPY;  Surgeon: Lesly Rubenstein, MD;  Location: [facility];  Service: Endoscopy;  Laterality: N/A;   COLONOSCOPY WITH PROPOFOL N/A 01/12/2020   Procedure: COLONOSCOPY WITH PROPOFOL;  Surgeon: Virgel Manifold, MD;  Location: [place];  Service: Endoscopy;  Laterality: N/A;  Diabetic - oral meds sleep apnea   ELECTROPHYSIOLOGIC STUDY N/A 05/26/2016   Procedure: Cardioversion;  Surgeon: Wellington Hampshire, MD;  Location: [facility];  Service: Cardiovascular;  Laterality: N/A;   ESOPHAGOGASTRODUODENOSCOPY (EGD) WITH PROPOFOL N/A 01/12/2020  Procedure: ESOPHAGOGASTRODUODENOSCOPY (EGD) WITH PROPOFOL;  Surgeon: Virgel Manifold, MD;  Location: [place];  Service: Endoscopy;  Laterality: N/A;   POLYPECTOMY N/A 01/12/2020   Procedure: POLYPECTOMY;  Surgeon: Virgel Manifold, MD;  Location: [place];  Service: Endoscopy;  Laterality: N/A;   Patient Active Problem List   Diagnosis Date Noted   Pes anserinus bursitis of right knee 04/19/2022   Primary osteoarthritis of right knee 03/27/2022   Greater 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 03/27/2022   It band syndrome, right 03/27/2022   Hyperlipidemia associated with type 2 diabetes mellitus ([place]) 05/12/2021   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 Halina Maidens  REFERRING PROVIDER: Dr. Rosette Reveal  REFERRING DIAG: M17.11 (ICD-10-CM) - Primary osteoarthritis of right knee, M76.31 (ICD-10-CM) - It band syndrome, right, M25.551 (ICD-10-CM) - Greater 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  RATIONALE FOR EVALUATION AND TREATMENT: Rehabilitation  THERAPY DIAG: Pain in right hip  Chronic pain of right knee  ONSET DATE: August 2023  FOLLOW-UP APPT SCHEDULED WITH REFERRING PROVIDER: Yes    SUBJECTIVE:                                                                                                                                                                                         SUBJECTIVE STATEMENT:  R hip and R knee pain  PERTINENT HISTORY:  Pt reports history of chronic R hip pain for decades with recent onset of R knee pain in August 2023. No known trauma but pt thinks she might have aggravated her knee walking across uneven grass. She saw Dr. Zigmund Daniel and eventually had steroid injections in her R greater trochanter, R knee injection, and R pes bursa on 04/18/22. Radiographs (03/14/22) of R knee showed mild degenerative changes at the patellofemoral joint, worse laterally. She receives chiropractic care every 2 weeks for her low back/hip pain and also sees massage therapy once/month.    PAIN:    Pain Intensity: Present: 1/10, Best: 0/10, Worst: 3/10 Pain location: Medial R knee pain Pain Quality: pressure, sharp Radiating: No  Numbness/Tingling: No Focal Weakness: Yes,  R knee feels unstable and feels like it will hyperextend Aggravating factors: Extended standing, standing after extended sitting,  squatting,  Relieving factors: resting, ice, elevation, heat, antiinflammatories, using cane and knee brace; 24-hour pain behavior: activity-dependent How long can you stand: 10 minutes History of prior back, hip, knee injury, pain, surgery, or therapy: Yes, history of chronic back and R hip pain. No prior history of orthopedic surgery to back, hip, or knee. Dominant hand: right Imaging: Yes, see history Red flags: Negative for bowel/bladder changes, saddle paresthesia, personal history of cancer, h/o spinal tumors, h/o compression fx, h/o abdominal aneurysm, abdominal pain, chills/fever, night sweats, nausea, vomiting, unrelenting pain, first onset of insidious LBP <20 y/o  PRECAUTIONS: None  WEIGHT BEARING RESTRICTIONS: No  FALLS: Has patient fallen in last 6 months? No  Living Environment Lives with: lives alone Lives in: House/apartment Stairs: Yes: External: 3 steps; can reach both Has following equipment at home: Single point cane  Prior level of function: Independent  Occupational demands: Pt is an assessment specialist at [employer] (has to walk a long way to get to the testing room).   Hobbies: Pottery (currently not limited but initially had trouble sitting at the wheel), gardening/yard work;  Patient Goals: walking across the yard with improved confidence, lifting heavier objects at home without pain in hip   OBJECTIVE:  Patient Surveys  LEFS 39/80 FOTO 52, predicted improvement to 73  Cognition Patient is oriented to person, place, and time.  Recent memory is intact.  Remote memory is intact.  Attention span and concentration are intact.  Expressive speech is intact.  Patient's fund of knowledge is within normal limits for educational level.    Gross Musculoskeletal Assessment Tremor: None Bulk: Normal Tone: Normal  GAIT: Full gait  assessment deferred however gait is notably antalgic on RLE  Posture: Forward head/rounded shoulders in sitting, full posture assessment deferre;  AROM AROM (Normal range in degrees) AROM    Hip Right Left  Flexion (125) WNL WNL  Extension (15)    Abduction (40)    Adduction     Internal Rotation (45)    External Rotation (45)        Knee    Flexion (135) WNL WNL  Extension (0) WNL WNL      Ankle    Dorsiflexion (20)    Plantarflexion (50)    Inversion (35)    Eversion (15    (* = pain; Blank rows = not tested)  LE MMT: MMT (out of 5) Right  Left   Hip flexion 4 4+  Hip extension 4- 4  Hip abduction 4- 4  Hip adduction 4- 4+  Hip internal rotation 4 5  Hip external rotation 4 5  Knee flexion 4 4+  Knee extension 5 5  Ankle dorsiflexion 4+ 4+  Ankle plantarflexion    Ankle inversion    Ankle eversion    (* = pain; Blank rows = not tested)  Sensation Pt reports decreased sensation in RLE L4-L5 but otherwise intact and symmetrical. Proprioception, and hot/cold testing deferred on this date.  Reflexes Deferred  Muscle Length Hamstrings: R: Positive for tightness at 80 degrees L: Positive for tightness at 80 degrees  Quadriceps Pat Patrick): R: Not examined L: Not examined Hip flexors Marcello Moores): R: Not examined L: Not examined IT band Nicoletta Dress): R: Not examined L: Not examined   Palpation Location LEFT  RIGHT           Quadriceps  0  Medial Hamstrings  0  Lateral Hamstrings  0  Lateral Hamstring tendon  0  Medial Hamstring tendon  1  Quadriceps tendon  0  Patella  0  Patellar Tendon  0  Tibial Tuberosity  0  Medial joint line  1  Lateral joint line  0  MCL  1  LCL  0  Adductor Tubercle  1  Pes Anserine tendon  1  Infrapatellar fat pad  0  Fibular head  0  Popliteal fossa  0  (Blank rows = not tested) Graded on 0-4 scale (0 = no pain, 1 = pain, 2 = pain with wincing/grimacing/flinching, 3 = pain with withdrawal, 4 = unwilling to allow palpation), (Blank  rows = not tested)  Passive Accessory Motion Deferred PAM testing of knee  VASCULAR Deferred  SPECIAL TESTS  Ligamentous Stability  ACL: Lachman's: R: Negative L: Not examined Anterior Drawer: R: Negative L: Not examined Pivot Shift: R: Negative L: Not examined  PCL: Posterior Drawer: R: Negative L: Not examined Reverse Lachman's: R: Negative L: Not examined Posterior Sag Sign: R: Negative L: Not examined  MCL: Valgus Stress (30 degrees flexion): R: Negative L: Not examined  LCL: Varus Stress (30 degrees flexion): R: Negative L: Not examined  Meniscus Tests McMurray's Test:  Medial Meniscus (Tibial ER): R: Negative L: Not examined Lateral Meniscus (Tibial IR): R: Negative L: Not examined Thessaly: R: Not examined L: Not examined Steinmann Sign I: R: Negative L: Not examined  Patellofemoral Pain Syndrome Patellar Tilt (Lateral): R: Negative L: Not examined Patellar Apprehension: R: Negative L: Not examined Squatting pain: R: Not examined L: Not examined Stair climbing pain: R: Negative L: Not examined Kneeling pain: R: Positive L: Not examined Resisted knee extension pain: R: Negative L: Not examined   Passive Accessory Intervertebral Motion Pt denies reproduction of hip or knee pain with CPA L1-L5 and UPA bilaterally L1-L5. Generally, hypomobile throughout with tenderness along spine.  Special Tests Lumbar Radiculopathy and Discogenic: Centralization and Peripheralization (SN 92, -LR 0.12): Not examined Slump (SN 83, -LR 0.32): R: Negative L: Negative SLR (SN 92, -LR 0.29): R: Negative L:  Negative Crossed SLR (SP 90): R: Negative L: Not examined  Facet Joint: Extension-Rotation (SN 100, -LR 0.0): R: Negative L: Negative  Lumbar Foraminal Stenosis: Lumbar quadrant (SN 70): R: Negative L: Negative  Hip: FABER (SN 81): R: Positive L: Negative FADIR (SN 94): R: Positive L: Negative Hip scour (SN 50): R: Negative L: Negative Figure 4: R: Negative L: Positive  for lateral L hip pain  SIJ:  Thigh Thrust (SN 88, -LR 0.18) : R: Not examined L: Not examined  Piriformis Syndrome: FAIR Test (SN 88, SP 83): R: Not examined L: Not examined  Functional Tasks Deferred  Patellar Tendinopathy Inferior pole palpation with anterior tilt: R: Negative L: Not examined   Beighton Scale: Deferred, denies history of hypermobility   TODAY'S TREATMENT  Deferred   PATIENT EDUCATION:  Education details: Plan of care Person educated: Patient Education method: Explanation Education comprehension: verbalized understanding   HOME EXERCISE PROGRAM:  None currently   GOALS: Goals reviewed with patient? Yes  SHORT TERM GOALS: Target date: 06/08/2022  Pt will be independent with HEP to improve strength and decrease knee pain to improve pain-free function at home and work. Baseline:  Goal status: INITIAL   LONG TERM GOALS: Target date: 07/06/2022  Pt will increase FOTO to at least 61 to demonstrate significant improvement in function at home and work related to knee pain  Baseline: 05/11/22: 52 Goal status: INITIAL  2.  Pt will be able to bend and squat without an increase  in her R knee pain in order to resume working in her yard/garden with less pain Baseline: 05/11/22: 3/10 R knee/hip pain Goal status: INITIAL  3.  Pt will increase LEFS score by at least 9 points in order demonstrate clinically significant reduction in knee pain/disability.       Baseline: 05/11/22: 39/80 Goal status: INITIAL  4.  Pt will increase strength of R hip abduction to at least 4+/5 MMT grade in order to demonstrate improvement in strength and function and improved stability at right knee . Baseline: 05/11/22: 4-/5 Goal status: INITIAL   ASSESSMENT:  CLINICAL IMPRESSION: Patient is a 66 y.o. female who was seen today for physical therapy evaluation and treatment for R hip and R knee pain. Evaluation focused more on knee pain but will return to assessing R hip at  future sessions. The most significant finding from the examination is notably less strength in R hip and knee compared to LLE.    OBJECTIVE IMPAIRMENTS: Abnormal gait, decreased strength, and pain.   ACTIVITY LIMITATIONS: lifting and squatting  PARTICIPATION LIMITATIONS: cleaning, shopping, community activity, occupation, and yard work  PERSONAL FACTORS: Age, Time since onset of injury/illness/exacerbation, and 3+ comorbidities: DM, obesity, OSA, and afib  are also affecting patient's functional outcome.   REHAB POTENTIAL: Fair    CLINICAL DECISION MAKING: Unstable/unpredictable  EVALUATION COMPLEXITY: High   PLAN: PT FREQUENCY: 1-2x/week  PT DURATION: 8 weeks  PLANNED INTERVENTIONS: Therapeutic exercises, Therapeutic activity, Neuromuscular re-education, Balance training, Gait training, Patient/Family education, Self Care, Joint mobilization, Joint manipulation, Vestibular training, Canalith repositioning, Orthotic/Fit training, DME instructions, Dry Needling, Electrical stimulation, Spinal manipulation, Spinal mobilization, Cryotherapy, Moist heat, Taping, Traction, Ultrasound, Ionotophoresis '4mg'$ /ml Dexamethasone, Manual therapy, and Re-evaluation.  PLAN FOR NEXT SESSION: Measure bilateral hip IR/ER, initiate strengthening for RLE, issue HEP;  Lyndel Safe English Tomer PT, DPT, GCS  Doyce Saling, PT 05/11/2022, 2:36 PM

## 2022-05-18 ENCOUNTER — Ambulatory Visit: Payer: Medicare Other | Attending: Family Medicine

## 2022-05-18 DIAGNOSIS — M25551 Pain in right hip: Secondary | ICD-10-CM | POA: Insufficient documentation

## 2022-05-18 DIAGNOSIS — G8929 Other chronic pain: Secondary | ICD-10-CM | POA: Diagnosis not present

## 2022-05-18 DIAGNOSIS — M25561 Pain in right knee: Secondary | ICD-10-CM | POA: Insufficient documentation

## 2022-05-18 NOTE — Therapy (Signed)
OUTPATIENT PHYSICAL THERAPY KNEE TREATMENT  Patient Name: Brandy [name] MRN: [id number] DOB:February 14, 1956, 67 y.o., female Today's Date: 05/19/2022  END OF SESSION:  PT End of Session - 05/18/22 1519     Visit Number 2    Number of Visits 17    Date for PT Re-Evaluation 07/06/22    Authorization Type eval: 05/11/22    PT Start Time 1530    PT Stop Time 1615    PT Time Calculation (min) 45 min    Activity Tolerance Patient tolerated treatment well    Behavior During Therapy [facility] for tasks assessed/performed            Past Medical History:  Diagnosis Date   Acute lower GI bleeding 01/19/2020   Arthritis    Atrial fibrillation with rapid ventricular response (HCC)    Chicken pox    Chronic systolic CHF (congestive heart failure) ([place])    a. 03/2016 Echo: Ef 15-20%, diff HK, ant AK;  b. 05/2016 Echo: Ef 30-35%, diff HK, mildly dil LA/RA.   Diabetes mellitus without complication (HCC)    Dysrhythmia    GERD (gastroesophageal reflux disease)    Heart murmur    Hip discomfort    been going on for 40 years    History of kidney stones    Hyperlipidemia associated with type 2 diabetes mellitus ([place]) 05/12/2021   Hypertension    Moderate mitral regurgitation    a. 03/2016 Echo: mod MR in setting of LV dysfxn.   Motion sickness    car - back seat   NICM (nonischemic cardiomyopathy) ([place])    a. 03/2016 Echo: EF 15-20%, diff HK, ant AK, mod MR, mod dil LA, mildly dil RA;  b. 04/2016 Cath: nl cors;  c. 05/2016 Echo: EF 30-35%, diff HK.   Obesity    Obstructive sleep apnea    compliant with CPAP   Persistent atrial fibrillation ([place])    a. Dx 03/2016;  b. CHA2DS2VASc = 2-->Eliquis '5mg'$  BID;  b. 05/2016 Failed DCCV x 4.   Sleep apnea    Stomach irritation    Visit for monitoring Tikosyn therapy 07/24/2016   Past Surgical History:  Procedure Laterality Date   ATRIAL FIBRILLATION ABLATION N/A 12/09/2020   Procedure: ATRIAL FIBRILLATION ABLATION;  Surgeon: Vickie Epley, MD;  Location:  [place] [facility];  Service: Cardiovascular;  Laterality: N/A;   BIOPSY N/A 01/12/2020   Procedure: BIOPSY;  Surgeon: Virgel Manifold, MD;  Location: [place];  Service: Endoscopy;  Laterality: N/A;   CARDIAC CATHETERIZATION N/A 04/17/2016   Procedure: Left Heart Cath and Coronary Angiography;  Surgeon: Wellington Hampshire, MD;  Location: [place] [facility];  Service: Cardiovascular;  Laterality: N/A;   CARDIOVERSION N/A 10/22/2020   Procedure: CARDIOVERSION;  Surgeon: Nelva Bush, MD;  Location: [facility];  Service: Cardiovascular;  Laterality: N/A;   CHOLECYSTECTOMY     COLON SURGERY  2021   COLONOSCOPY N/A 01/20/2020   Procedure: COLONOSCOPY;  Surgeon: Lesly Rubenstein, MD;  Location: [facility];  Service: Endoscopy;  Laterality: N/A;   COLONOSCOPY WITH PROPOFOL N/A 01/12/2020   Procedure: COLONOSCOPY WITH PROPOFOL;  Surgeon: Virgel Manifold, MD;  Location: [place];  Service: Endoscopy;  Laterality: N/A;  Diabetic - oral meds sleep apnea   ELECTROPHYSIOLOGIC STUDY N/A 05/26/2016   Procedure: Cardioversion;  Surgeon: Wellington Hampshire, MD;  Location: [facility];  Service: Cardiovascular;  Laterality: N/A;   ESOPHAGOGASTRODUODENOSCOPY (EGD) WITH PROPOFOL N/A 01/12/2020   Procedure:  ESOPHAGOGASTRODUODENOSCOPY (EGD) WITH PROPOFOL;  Surgeon: Virgel Manifold, MD;  Location: [place];  Service: Endoscopy;  Laterality: N/A;   POLYPECTOMY N/A 01/12/2020   Procedure: POLYPECTOMY;  Surgeon: Virgel Manifold, MD;  Location: [place];  Service: Endoscopy;  Laterality: N/A;   Patient Active Problem List   Diagnosis Date Noted   Pes anserinus bursitis of right knee 04/19/2022   Primary osteoarthritis of right knee 03/27/2022   Greater 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 03/27/2022   It band syndrome, right 03/27/2022   Hyperlipidemia associated with type 2 diabetes mellitus ([place]) 05/12/2021   Palmar fascial fibromatosis  ([place]) 04/01/2021   Varicose veins of leg with pain, bilateral 07/06/2020   Acquired thrombophilia ([name]) 05/24/2020   Type II diabetes mellitus with complication ([place]) 68/03/5725   Gastric polyp    Colon polyps    Varicose veins of both lower extremities 10/20/2019   Dysphagia 10/20/2019   Osteoarthritis of right hand 10/31/2016   Nonischemic cardiomyopathy ([place]) 04/14/2016   BMI 33.0-33.9,adult 04/14/2016   PCP: Dr. Halina Maidens  REFERRING PROVIDER: Dr. Rosette Reveal  REFERRING DIAG: M17.11 (ICD-10-CM) - Primary osteoarthritis of right knee, M76.31 (ICD-10-CM) - It band syndrome, right, M25.551 (ICD-10-CM) - Greater 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  RATIONALE FOR EVALUATION AND TREATMENT: Rehabilitation  THERAPY DIAG: Chronic pain of right knee  Pain in right hip  ONSET DATE: August 2023  FOLLOW-UP APPT SCHEDULED WITH REFERRING PROVIDER: Yes   FROM INITIAL EVALUATION SUBJECTIVE:                                                                                                                                                                                         SUBJECTIVE STATEMENT:  R hip and R knee pain  PERTINENT HISTORY:  Pt reports history of chronic R hip pain for decades with recent onset of R knee pain in August 2023. No known trauma but pt thinks she might have aggravated her knee walking across uneven grass. She saw Dr. Zigmund Daniel and eventually had steroid injections in her R greater trochanter, R knee injection, and R pes bursa on 04/18/22. Radiographs (03/14/22) of R knee showed mild degenerative changes at the patellofemoral joint, worse laterally. She receives chiropractic care every 2 weeks for her low back/hip pain and also sees massage therapy once/month.   PAIN:    Pain Intensity: Present: 1/10, Best: 0/10, Worst: 3/10 Pain location: Medial R knee pain Pain Quality: pressure, sharp Radiating: No  Numbness/Tingling: No Focal Weakness:  Yes, R knee feels unstable and feels like it will hyperextend Aggravating factors: Extended standing, standing after extended sitting, squatting,  Relieving factors: resting, ice, elevation, heat, antiinflammatories, using cane and knee brace; 24-hour pain behavior: activity-dependent How long can you stand: 10 minutes History of prior back, hip, knee injury, pain, surgery, or therapy: Yes, history of chronic back and R hip pain. No prior history of orthopedic surgery to back, hip, or knee. Dominant hand: right Imaging: Yes, see history Red flags: Negative for bowel/bladder changes, saddle paresthesia, personal history of cancer, h/o spinal tumors, h/o compression fx, h/o abdominal aneurysm, abdominal pain, chills/fever, night sweats, nausea, vomiting, unrelenting pain, first onset of insidious LBP <20 y/o  PRECAUTIONS: None  WEIGHT BEARING RESTRICTIONS: No  FALLS: Has patient fallen in last 6 months? No  Living Environment Lives with: lives alone Lives in: House/apartment Stairs: Yes: External: 3 steps; can reach both Has following equipment at home: Single point cane  Prior level of function: Independent  Occupational demands: Pt is an assessment specialist at [employer] (has to walk a long way to get to the testing room).   Hobbies: Pottery (currently not limited but initially had trouble sitting at the wheel), gardening/yard work;  Patient Goals: walking across the yard with improved confidence, lifting heavier objects at home without pain in hip  OBJECTIVE:  Patient Surveys  LEFS 39/80 FOTO 52, predicted improvement to 70  Cognition Patient is oriented to person, place, and time.  Recent memory is intact.  Remote memory is intact.  Attention span and concentration are intact.  Expressive speech is intact.  Patient's fund of knowledge is within normal limits for educational level.    Gross Musculoskeletal Assessment Tremor: None Bulk: Normal Tone: Normal  GAIT: Full  gait assessment deferred however gait is notably antalgic on RLE  Posture: Forward head/rounded shoulders in sitting, full posture assessment deferre;  AROM AROM (Normal range in degrees) AROM    Hip Right Left  Flexion (125) WNL WNL  Extension (15)    Abduction (40)    Adduction     Internal Rotation (45)    External Rotation (45)        Knee    Flexion (135) WNL WNL  Extension (0) WNL WNL      Ankle    Dorsiflexion (20)    Plantarflexion (50)    Inversion (35)    Eversion (15    (* = pain; Blank rows = not tested)  LE MMT: MMT (out of 5) Right  Left   Hip flexion 4 4+  Hip extension 4- 4  Hip abduction 4- 4  Hip adduction 4- 4+  Hip internal rotation 4 5  Hip external rotation 4 5  Knee flexion 4 4+  Knee extension 5 5  Ankle dorsiflexion 4+ 4+  Ankle plantarflexion    Ankle inversion    Ankle eversion    (* = pain; Blank rows = not tested)  Sensation Pt reports decreased sensation in RLE L4-L5 but otherwise intact and symmetrical. Proprioception, and hot/cold testing deferred on this date.  Reflexes Deferred  Muscle Length Hamstrings: R: Positive for tightness at 80 degrees L: Positive for tightness at 80 degrees  Quadriceps Pat Patrick): R: Not examined L: Not examined Hip flexors Marcello Moores): R: Not examined L: Not examined IT band Nicoletta Dress): R: Not examined L: Not examined  Palpation Location LEFT  RIGHT           Quadriceps  0  Medial Hamstrings  0  Lateral Hamstrings  0  Lateral Hamstring tendon  0  Medial Hamstring tendon  1  Quadriceps tendon  0  Patella  0  Patellar Tendon  0  Tibial Tuberosity  0  Medial joint line  1  Lateral joint line  0  MCL  1  LCL  0  Adductor Tubercle  1  Pes Anserine tendon  1  Infrapatellar fat pad  0  Fibular head  0  Popliteal fossa  0  (Blank rows = not tested) Graded on 0-4 scale (0 = no pain, 1 = pain, 2 = pain with wincing/grimacing/flinching, 3 = pain with withdrawal, 4 = unwilling to allow palpation), (Blank  rows = not tested)  Passive Accessory Motion Deferred PAM testing of knee  VASCULAR Deferred  SPECIAL TESTS  Ligamentous Stability  ACL: Lachman's: R: Negative L: Not examined Anterior Drawer: R: Negative L: Not examined Pivot Shift: R: Negative L: Not examined  PCL: Posterior Drawer: R: Negative L: Not examined Reverse Lachman's: R: Negative L: Not examined Posterior Sag Sign: R: Negative L: Not examined  MCL: Valgus Stress (30 degrees flexion): R: Negative L: Not examined  LCL: Varus Stress (30 degrees flexion): R: Negative L: Not examined  Meniscus Tests McMurray's Test:  Medial Meniscus (Tibial ER): R: Negative L: Not examined Lateral Meniscus (Tibial IR): R: Negative L: Not examined Thessaly: R: Not examined L: Not examined Steinmann Sign I: R: Negative L: Not examined  Patellofemoral Pain Syndrome Patellar Tilt (Lateral): R: Negative L: Not examined Patellar Apprehension: R: Negative L: Not examined Squatting pain: R: Not examined L: Not examined Stair climbing pain: R: Negative L: Not examined Kneeling pain: R: Positive L: Not examined Resisted knee extension pain: R: Negative L: Not examined  Passive Accessory Intervertebral Motion Pt denies reproduction of hip or knee pain with CPA L1-L5 and UPA bilaterally L1-L5. Generally, hypomobile throughout with tenderness along spine.  Special Tests Lumbar Radiculopathy and Discogenic: Centralization and Peripheralization (SN 92, -LR 0.12): Not examined Slump (SN 83, -LR 0.32): R: Negative L: Negative SLR (SN 92, -LR 0.29): R: Negative L:  Negative Crossed SLR (SP 90): R: Negative L: Not examined  Facet Joint: Extension-Rotation (SN 100, -LR 0.0): R: Negative L: Negative  Lumbar Foraminal Stenosis: Lumbar quadrant (SN 70): R: Negative L: Negative  Hip: FABER (SN 81): R: Positive L: Negative FADIR (SN 94): R: Positive L: Negative Hip scour (SN 50): R: Negative L: Negative Figure 4: R: Negative L: Positive  for lateral L hip pain  SIJ:  Thigh Thrust (SN 88, -LR 0.18) : R: Not examined L: Not examined  Piriformis Syndrome: FAIR Test (SN 88, SP 83): R: Not examined L: Not examined  Functional Tasks Deferred  Patellar Tendinopathy Inferior pole palpation with anterior tilt: R: Negative L: Not examined   Beighton Scale: Deferred, denies history of hypermobility   TODAY'S TREATMENT    SUBJECTIVE: Pt reports that today was the first day she put on a knee brace because she was having knee pain this morning. However she does not have any resting knee pain currently. Otherwise her knee has been doing well and she has been able to be active. She has started weaning herself off the anti-inflammatory medication. No specific questions or concerns.   PAIN: Denies pain   Ther-ex  NuStep L1-2 x 5 minutes  Measure bilateral hip ER/IR: R ER/IR: 50/27 L ER/IR: 50/23  Supine R quad set over towel roll 5s hold x 10; Supine R SLR x 5, discontinued secondary to R knee and hip pain; Hooklying R SAQ over bolster with manual resistance 2 x 10; Hooklying clams with green  tband 2 x 10 BLE; Hooklying adductor ball squeeze 2 x 10 BLE; Hooklying bridges with arms across chest 2 x 10; Hooklying marching with green tband around knees 2 x 10 BLE; Sidelying straight knee hip abduction 2 x 10 BLE; HEP issued and reviewed with patient;   PATIENT EDUCATION:  Education details: Pt educated throughout session about proper posture and technique with exercises. Improved exercise technique, movement at target joints, use of target muscles after min to mod verbal, visual, tactile cues. HEP Person educated: Patient Education method: Explanation and handout Education comprehension: verbalized understanding and returned demonstration   HOME EXERCISE PROGRAM:  Access Code: [email or web address] URL: [email or web address] Date: 05/18/2022 Prepared by: Roxana Hires  Exercises - Supine Bridge  - 1 x daily - 7 x  weekly - 2 sets - 10 reps - 2s hold - Hooklying Clamshell with Resistance  - 1 x daily - 7 x weekly - 2 sets - 10 reps - 2s hold - Supine March with Resistance Band  - 1 x daily - 7 x weekly - 2 sets - 10 reps - 2s hold - Seated Hip Adduction Isometrics with Ball  - 1 x daily - 7 x weekly - 2 sets - 10 reps - 2s hold - Supine Quad Set (Mirrored)  - 1 x daily - 7 x weekly - 2 sets - 10 reps - 5s hold   GOALS: Goals reviewed with patient? Yes  SHORT TERM GOALS: Target date: 06/08/2022  Pt will be independent with HEP to improve strength and decrease knee pain to improve pain-free function at home and work. Baseline:  Goal status: INITIAL   LONG TERM GOALS: Target date: 07/06/2022  Pt will increase FOTO to at least 61 to demonstrate significant improvement in function at home and work related to knee pain  Baseline: 05/11/22: 52 Goal status: INITIAL  2.  Pt will be able to bend and squat without an increase in her R knee pain in order to resume working in her yard/garden with less pain Baseline: 05/11/22: 3/10 R knee/hip pain Goal status: INITIAL  3.  Pt will increase LEFS score by at least 9 points in order demonstrate clinically significant reduction in knee pain/disability.       Baseline: 05/11/22: 39/80 Goal status: INITIAL  4.  Pt will increase strength of R hip abduction to at least 4+/5 MMT grade in order to demonstrate improvement in strength and function and improved stability at right knee . Baseline: 05/11/22: 4-/5 Goal status: INITIAL   ASSESSMENT:  CLINICAL IMPRESSION: Measured bilateral hip rotation today with notable deficits in internal rotation bilaterally. Initiated strengthening on mat table to limit WB through R hip and knee. Pt is able to complete all exercises with minimal pain. Issued HEP and reviewed with patient. Pt encouraged to follow-up as scheduled. Pt will benefit from PT services to address deficits in strength and range of motion in order to return  to full function at home with less pain.  OBJECTIVE IMPAIRMENTS: Abnormal gait, decreased strength, and pain.   ACTIVITY LIMITATIONS: lifting and squatting  PARTICIPATION LIMITATIONS: cleaning, shopping, community activity, occupation, and yard work  PERSONAL FACTORS: Age, Time since onset of injury/illness/exacerbation, and 3+ comorbidities: DM, obesity, OSA, and afib  are also affecting patient's functional outcome.   REHAB POTENTIAL: Fair    CLINICAL DECISION MAKING: Unstable/unpredictable  EVALUATION COMPLEXITY: High   PLAN: PT FREQUENCY: 1-2x/week  PT DURATION: 8 weeks  PLANNED INTERVENTIONS: Therapeutic exercises, Therapeutic activity, Neuromuscular re-education, Balance training, Gait  training, Patient/Family education, Self Care, Joint mobilization, Joint manipulation, Vestibular training, Canalith repositioning, Orthotic/Fit training, DME instructions, Dry Needling, Electrical stimulation, Spinal manipulation, Spinal mobilization, Cryotherapy, Moist heat, Taping, Traction, Ultrasound, Ionotophoresis '4mg'$ /ml Dexamethasone, Manual therapy, and Re-evaluation.  PLAN FOR NEXT SESSION: Progress strengthening for RLE, review/modify HEP as needed;  Lyndel Safe Denette Hass PT, DPT, GCS  Ashana Tullo, PT 05/19/2022, 8:27 AM

## 2022-05-22 ENCOUNTER — Ambulatory Visit (INDEPENDENT_AMBULATORY_CARE_PROVIDER_SITE_OTHER): Payer: Medicare Other

## 2022-05-22 DIAGNOSIS — I4819 Other persistent atrial fibrillation: Secondary | ICD-10-CM | POA: Diagnosis not present

## 2022-05-23 ENCOUNTER — Ambulatory Visit: Payer: Medicare Other

## 2022-05-23 DIAGNOSIS — G8929 Other chronic pain: Secondary | ICD-10-CM | POA: Diagnosis not present

## 2022-05-23 DIAGNOSIS — M25561 Pain in right knee: Secondary | ICD-10-CM | POA: Diagnosis not present

## 2022-05-23 DIAGNOSIS — M25551 Pain in right hip: Secondary | ICD-10-CM

## 2022-05-23 LAB — CUP PACEART REMOTE DEVICE CHECK
Date Time Interrogation Session: 20240107231046
Implantable Pulse Generator Implant Date: 20230928

## 2022-05-23 NOTE — Therapy (Signed)
OUTPATIENT PHYSICAL THERAPY KNEE TREATMENT  Patient Name: Brandy [name] MRN: [id number] DOB:1956/01/11, 67 y.o., female Today's Date: 05/23/2022  END OF SESSION:  PT End of Session - 05/23/22 1145     Visit Number 3    Number of Visits 17    Date for PT Re-Evaluation 07/06/22    Authorization Type eval: 05/11/22    PT Start Time 1145    PT Stop Time 1230    PT Time Calculation (min) 45 min    Activity Tolerance Patient tolerated treatment well    Behavior During Therapy [facility] for tasks assessed/performed            Past Medical History:  Diagnosis Date   Acute lower GI bleeding 01/19/2020   Arthritis    Atrial fibrillation with rapid ventricular response (HCC)    Chicken pox    Chronic systolic CHF (congestive heart failure) ([place])    a. 03/2016 Echo: Ef 15-20%, diff HK, ant AK;  b. 05/2016 Echo: Ef 30-35%, diff HK, mildly dil LA/RA.   Diabetes mellitus without complication (HCC)    Dysrhythmia    GERD (gastroesophageal reflux disease)    Heart murmur    Hip discomfort    been going on for 40 years    History of kidney stones    Hyperlipidemia associated with type 2 diabetes mellitus ([place]) 05/12/2021   Hypertension    Moderate mitral regurgitation    a. 03/2016 Echo: mod MR in setting of LV dysfxn.   Motion sickness    car - back seat   NICM (nonischemic cardiomyopathy) ([place])    a. 03/2016 Echo: EF 15-20%, diff HK, ant AK, mod MR, mod dil LA, mildly dil RA;  b. 04/2016 Cath: nl cors;  c. 05/2016 Echo: EF 30-35%, diff HK.   Obesity    Obstructive sleep apnea    compliant with CPAP   Persistent atrial fibrillation ([place])    a. Dx 03/2016;  b. CHA2DS2VASc = 2-->Eliquis '5mg'$  BID;  b. 05/2016 Failed DCCV x 4.   Sleep apnea    Stomach irritation    Visit for monitoring Tikosyn therapy 07/24/2016   Past Surgical History:  Procedure Laterality Date   ATRIAL FIBRILLATION ABLATION N/A 12/09/2020   Procedure: ATRIAL FIBRILLATION ABLATION;  Surgeon: Vickie Epley, MD;  Location:  [facility];  Service: Cardiovascular;  Laterality: N/A;   BIOPSY N/A 01/12/2020   Procedure: BIOPSY;  Surgeon: Virgel Manifold, MD;  Location: [place];  Service: Endoscopy;  Laterality: N/A;   CARDIAC CATHETERIZATION N/A 04/17/2016   Procedure: Left Heart Cath and Coronary Angiography;  Surgeon: Wellington Hampshire, MD;  Location: [facility] [facility];  Service: Cardiovascular;  Laterality: N/A;   CARDIOVERSION N/A 10/22/2020   Procedure: CARDIOVERSION;  Surgeon: Nelva Bush, MD;  Location: [facility];  Service: Cardiovascular;  Laterality: N/A;   CHOLECYSTECTOMY     COLON SURGERY  2021   COLONOSCOPY N/A 01/20/2020   Procedure: COLONOSCOPY;  Surgeon: Lesly Rubenstein, MD;  Location: [facility];  Service: Endoscopy;  Laterality: N/A;   COLONOSCOPY WITH PROPOFOL N/A 01/12/2020   Procedure: COLONOSCOPY WITH PROPOFOL;  Surgeon: Virgel Manifold, MD;  Location: [place];  Service: Endoscopy;  Laterality: N/A;  Diabetic - oral meds sleep apnea   ELECTROPHYSIOLOGIC STUDY N/A 05/26/2016   Procedure: Cardioversion;  Surgeon: Wellington Hampshire, MD;  Location: [facility];  Service: Cardiovascular;  Laterality: N/A;   ESOPHAGOGASTRODUODENOSCOPY (EGD) WITH PROPOFOL N/A 01/12/2020   Procedure:  ESOPHAGOGASTRODUODENOSCOPY (EGD) WITH PROPOFOL;  Surgeon: Virgel Manifold, MD;  Location: [place];  Service: Endoscopy;  Laterality: N/A;   POLYPECTOMY N/A 01/12/2020   Procedure: POLYPECTOMY;  Surgeon: Virgel Manifold, MD;  Location: [place];  Service: Endoscopy;  Laterality: N/A;   Patient Active Problem List   Diagnosis Date Noted   Pes anserinus bursitis of right knee 04/19/2022   Primary osteoarthritis of right knee 03/27/2022   Greater 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 03/27/2022   It band syndrome, right 03/27/2022   Hyperlipidemia associated with type 2 diabetes mellitus ([place]) 05/12/2021   Palmar fascial fibromatosis  (dupuytren) 04/01/2021   Varicose veins of leg with pain, bilateral 07/06/2020   Acquired thrombophilia ([place]) 05/24/2020   Type II diabetes mellitus with complication ([place]) 73/53/2992   Gastric polyp    Colon polyps    Varicose veins of both lower extremities 10/20/2019   Dysphagia 10/20/2019   Osteoarthritis of right hand 10/31/2016   Nonischemic cardiomyopathy ([place]) 04/14/2016   BMI 33.0-33.9,adult 04/14/2016   PCP: Dr. Halina Maidens  REFERRING PROVIDER: Dr. Rosette Reveal  REFERRING DIAG: M17.11 (ICD-10-CM) - Primary osteoarthritis of right knee, M76.31 (ICD-10-CM) - It band syndrome, right, M25.551 (ICD-10-CM) - Greater 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  RATIONALE FOR EVALUATION AND TREATMENT: Rehabilitation  THERAPY DIAG: Chronic pain of right knee  Pain in right hip  ONSET DATE: August 2023  FOLLOW-UP APPT SCHEDULED WITH REFERRING PROVIDER: Yes   FROM INITIAL EVALUATION SUBJECTIVE:                                                                                                                                                                                         SUBJECTIVE STATEMENT:  R hip and R knee pain  PERTINENT HISTORY:  Pt reports history of chronic R hip pain for decades with recent onset of R knee pain in August 2023. No known trauma but pt thinks she might have aggravated her knee walking across uneven grass. She saw Dr. Zigmund Daniel and eventually had steroid injections in her R greater trochanter, R knee injection, and R pes bursa on 04/18/22. Radiographs (03/14/22) of R knee showed mild degenerative changes at the patellofemoral joint, worse laterally. She receives chiropractic care every 2 weeks for her low back/hip pain and also sees massage therapy once/month.   PAIN:    Pain Intensity: Present: 1/10, Best: 0/10, Worst: 3/10 Pain location: Medial R knee pain Pain Quality: pressure, sharp Radiating: No  Numbness/Tingling: No Focal Weakness:  Yes, R knee feels unstable and feels like it will hyperextend Aggravating factors: Extended standing, standing after extended sitting, squatting,  Relieving factors: resting, ice, elevation, heat, antiinflammatories, using cane and knee brace; 24-hour pain behavior: activity-dependent How long can you stand: 10 minutes History of prior back, hip, knee injury, pain, surgery, or therapy: Yes, history of chronic back and R hip pain. No prior history of orthopedic surgery to back, hip, or knee. Dominant hand: right Imaging: Yes, see history Red flags: Negative for bowel/bladder changes, saddle paresthesia, personal history of cancer, h/o spinal tumors, h/o compression fx, h/o abdominal aneurysm, abdominal pain, chills/fever, night sweats, nausea, vomiting, unrelenting pain, first onset of insidious LBP <19 y/o  PRECAUTIONS: None  WEIGHT BEARING RESTRICTIONS: No  FALLS: Has patient fallen in last 6 months? 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   Hobbies: Pottery (currently not limited but initially had trouble sitting at the wheel), gardening/yard work;  Patient Goals: walking across the yard with improved confidence, lifting heavier objects at home without pain in hip  OBJECTIVE:  Patient Surveys  LEFS 39/80 FOTO 52, predicted improvement to 62  Cognition Patient is oriented to person, place, and time.  Recent memory is intact.  Remote memory is intact.  Attention span and concentration are intact.  Expressive speech is intact.  Patient's fund of knowledge is within normal limits for educational level.    Gross Musculoskeletal Assessment Tremor: None Bulk: Normal Tone: Normal  GAIT: Full  gait assessment deferred however gait is notably antalgic on RLE  Posture: Forward head/rounded shoulders in sitting, full posture assessment deferre;  AROM AROM (Normal range in degrees) AROM    Hip Right Left  Flexion (125) WNL WNL  Extension (15)    Abduction (40)    Adduction     Internal Rotation (45)    External Rotation (45)        Knee    Flexion (135) WNL WNL  Extension (0) WNL WNL      Ankle    Dorsiflexion (20)    Plantarflexion (50)    Inversion (35)    Eversion (15    (* = pain; Blank rows = not tested)  LE MMT: MMT (out of 5) Right  Left   Hip flexion 4 4+  Hip extension 4- 4  Hip abduction 4- 4  Hip adduction 4- 4+  Hip internal rotation 4 5  Hip external rotation 4 5  Knee flexion 4 4+  Knee extension 5 5  Ankle dorsiflexion 4+ 4+  Ankle plantarflexion    Ankle inversion    Ankle eversion    (* = pain; Blank rows = not tested)  Sensation Pt reports decreased sensation in RLE L4-L5 but otherwise intact and symmetrical. Proprioception, and hot/cold testing deferred on this date.  Reflexes Deferred  Muscle Length Hamstrings: R: Positive for tightness at 80 degrees L: Positive for tightness at 80 degrees  Quadriceps Pat Patrick): R: Not examined L: Not examined Hip flexors Marcello Moores): R: Not examined L: Not examined IT band Nicoletta Dress): R: Not examined L: Not examined  Palpation Location LEFT  RIGHT           Quadriceps  0  Medial Hamstrings  0  Lateral Hamstrings  0  Lateral Hamstring tendon  0  Medial Hamstring tendon  1  Quadriceps tendon  0  Patella  0  Patellar Tendon  0  Tibial Tuberosity  0  Medial joint line  1  Lateral joint line  0  MCL  1  LCL  0  Adductor Tubercle  1  Pes Anserine tendon  1  Infrapatellar fat pad  0  Fibular head  0  Popliteal fossa  0  (Blank rows = not tested) Graded on 0-4 scale (0 = no pain, 1 = pain, 2 = pain with wincing/grimacing/flinching, 3 = pain with withdrawal, 4 = unwilling to allow palpation), (Blank  rows = not tested)  Passive Accessory Motion Deferred PAM testing of knee  VASCULAR Deferred  SPECIAL TESTS  Ligamentous Stability  ACL: Lachman's: R: Negative L: Not examined Anterior Drawer: R: Negative L: Not examined Pivot Shift: R: Negative L: Not examined  PCL: Posterior Drawer: R: Negative L: Not examined Reverse Lachman's: R: Negative L: Not examined Posterior Sag Sign: R: Negative L: Not examined  MCL: Valgus Stress (30 degrees flexion): R: Negative L: Not examined  LCL: Varus Stress (30 degrees flexion): R: Negative L: Not examined  Meniscus Tests McMurray's Test:  Medial Meniscus (Tibial ER): R: Negative L: Not examined Lateral Meniscus (Tibial IR): R: Negative L: Not examined Thessaly: R: Not examined L: Not examined Steinmann Sign I: R: Negative L: Not examined  Patellofemoral Pain Syndrome Patellar Tilt (Lateral): R: Negative L: Not examined Patellar Apprehension: R: Negative L: Not examined Squatting pain: R: Not examined L: Not examined Stair climbing pain: R: Negative L: Not examined Kneeling pain: R: Positive L: Not examined Resisted knee extension pain: R: Negative L: Not examined  Passive Accessory Intervertebral Motion Pt denies reproduction of hip or knee pain with CPA L1-L5 and UPA bilaterally L1-L5. Generally, hypomobile throughout with tenderness along spine.  Special Tests Lumbar Radiculopathy and Discogenic: Centralization and Peripheralization (SN 92, -LR 0.12): Not examined Slump (SN 83, -LR 0.32): R: Negative L: Negative SLR (SN 92, -LR 0.29): R: Negative L:  Negative Crossed SLR (SP 90): R: Negative L: Not examined  Facet Joint: Extension-Rotation (SN 100, -LR 0.0): R: Negative L: Negative  Lumbar Foraminal Stenosis: Lumbar quadrant (SN 70): R: Negative L: Negative  Hip: FABER (SN 81): R: Positive L: Negative FADIR (SN 94): R: Positive L: Negative Hip scour (SN 50): R: Negative L: Negative Figure 4: R: Negative L: Positive  for lateral L hip pain  SIJ:  Thigh Thrust (SN 88, -LR 0.18) : R: Not examined L: Not examined  Piriformis Syndrome: FAIR Test (SN 88, SP 83): R: Not examined L: Not examined  Functional Tasks Deferred  Patellar Tendinopathy Inferior pole palpation with anterior tilt: R: Negative L: Not examined   Beighton Scale: Deferred, denies history of hypermobility   TODAY'S TREATMENT    SUBJECTIVE: Pt reports that she is doing well today. Notes continued improvement in RLE strength. Pt states she has not had any more episodes of buckling or R knee hyperextension since the last therapy session. She would like to know if there are some exercises she can perform in sitting while she is at work. No specific concerns currently.   PAIN: Denies pain   Ther-ex  NuStep L1 x 5 minutes   Supine R quad set over towel roll 5s hold x 10; Supine R SLR x 5, discontinued secondary to R knee and hip pain; Hooklying marching with green tband around knees 2 x 10 BLE; Hooklying R SAQ over bolster with manual resistance 2 x 10; Hooklying clams with green tband 2  x 10 BLE; Hooklying adductor ball squeeze 2 x 10 BLE; Hooklying bridges with arms across chest and green tband around knees 2 x 10; Seated LAQ with 5# ankle weights (AW) 2 x 10 BLE; Seated HS curls with green tband 2 x 10 BLE;  Standing exercises with 5# AW: Hip flexion marches x 10 BLE; Hip abduction x 10 BLE; HS curls x 10 BLE; Hip extension x 10 BLE;  HEP updated and reviewed with patient;  Not performed: Sidelying straight knee hip abduction 2 x 10 BLE;   PATIENT EDUCATION:  Education details: Pt educated throughout session about proper posture and technique with exercises. Improved exercise technique, movement at target joints, use of target muscles after min to mod verbal, visual, tactile cues. HEP Person educated: Patient Education method: Explanation and handout Education comprehension: verbalized understanding and returned  demonstration   HOME EXERCISE PROGRAM:  Access Code: [email or web address] URL: [email or web address] Date: 05/23/2022 Prepared by: Roxana Hires  Exercises - Supine Quad Set (Mirrored)  - 1 x daily - 7 x weekly - 2 sets - 10 reps - 5s hold - Supine Bridge  - 1 x daily - 7 x weekly - 2 sets - 10 reps - 2s hold - Supine March with Resistance Band  - 1 x daily - 7 x weekly - 2 sets - 10 reps - 2s hold - Seated Hip Adduction Isometrics with Ball  - 1 x daily - 7 x weekly - 2 sets - 10 reps - 2s hold - Seated Hip Abduction with Resistance  - 1 x daily - 7 x weekly - 2 sets - 10 reps - 2s hold - Standing Hip Abduction with Resistance at Ankles and Counter Support  - 1 x daily - 7 x weekly - 2 sets - 10 reps - 2s hold - Standing Hip Extension with Resistance at Counter Support  - 1 x daily - 7 x weekly - 2 sets - 10 reps - 2 hold   GOALS: Goals reviewed with patient? Yes  SHORT TERM GOALS: Target date: 06/08/2022  Pt will be independent with HEP to improve strength and decrease knee pain to improve pain-free function at home and work. Baseline:  Goal status: INITIAL   LONG TERM GOALS: Target date: 07/06/2022  Pt will increase FOTO to at least 61 to demonstrate significant improvement in function at home and work related to knee pain  Baseline: 05/11/22: 52 Goal status: INITIAL  2.  Pt will be able to bend and squat without an increase in her R knee pain in order to resume working in her yard/garden with less pain Baseline: 05/11/22: 3/10 R knee/hip pain Goal status: INITIAL  3.  Pt will increase LEFS score by at least 9 points in order demonstrate clinically significant reduction in knee pain/disability.       Baseline: 05/11/22: 39/80 Goal status: INITIAL  4.  Pt will increase strength of R hip abduction to at least 4+/5 MMT grade in order to demonstrate improvement in strength and function and improved stability at right knee . Baseline: 05/11/22: 4-/5 Goal status:  INITIAL   ASSESSMENT:  CLINICAL IMPRESSION: Pt demonstrates excellent motivation and effort during session today. Progressed strengthening to seated and standing exercises. Pt denies any increase in R knee or hip pain when in WB positions.  HEP updated with patient and discussed exercises she can perform while sitting at work. Pt encouraged to follow-up as scheduled. Pt will benefit from PT services to address deficits in strength and  range of motion in order to return to full function at home with less pain.  OBJECTIVE IMPAIRMENTS: Abnormal gait, decreased strength, and pain.   ACTIVITY LIMITATIONS: lifting and squatting  PARTICIPATION LIMITATIONS: cleaning, shopping, community activity, occupation, and yard work  PERSONAL FACTORS: Age, Time since onset of injury/illness/exacerbation, and 3+ comorbidities: DM, obesity, OSA, and afib  are also affecting patient's functional outcome.   REHAB POTENTIAL: Fair    CLINICAL DECISION MAKING: Unstable/unpredictable  EVALUATION COMPLEXITY: High   PLAN: PT FREQUENCY: 1-2x/week  PT DURATION: 8 weeks  PLANNED INTERVENTIONS: Therapeutic exercises, Therapeutic activity, Neuromuscular re-education, Balance training, Gait training, Patient/Family education, Self Care, Joint mobilization, Joint manipulation, Vestibular training, Canalith repositioning, Orthotic/Fit training, DME instructions, Dry Needling, Electrical stimulation, Spinal manipulation, Spinal mobilization, Cryotherapy, Moist heat, Taping, Traction, Ultrasound, Ionotophoresis '4mg'$ /ml Dexamethasone, Manual therapy, and Re-evaluation.  PLAN FOR NEXT SESSION: Progress strengthening for RLE, review/modify HEP as needed;  Lyndel Safe Tallie Hevia PT, DPT, GCS  Devonia Farro, PT 05/23/2022, 1:09 PM

## 2022-05-25 NOTE — Progress Notes (Signed)
Care[facility] Summary Report / Loop Recorder 

## 2022-05-26 ENCOUNTER — Ambulatory Visit: Payer: Medicare Other

## 2022-05-26 DIAGNOSIS — M25551 Pain in right hip: Secondary | ICD-10-CM | POA: Diagnosis not present

## 2022-05-26 DIAGNOSIS — G8929 Other chronic pain: Secondary | ICD-10-CM

## 2022-05-26 DIAGNOSIS — M25561 Pain in right knee: Secondary | ICD-10-CM | POA: Diagnosis not present

## 2022-05-26 NOTE — Therapy (Signed)
OUTPATIENT PHYSICAL THERAPY KNEE TREATMENT  Patient Name: TYREISHA UNGAR MRN: [id number] DOB:1956-03-16, 67 y.o., female Today's Date: 05/26/2022  END OF SESSION:  PT End of Session - 05/26/22 0939     Visit Number 4    Number of Visits 17    Date for PT Re-Evaluation 07/06/22    Authorization Type eval: 05/11/22    PT Start Time 0931    PT Stop Time 1015    PT Time Calculation (min) 44 min    Activity Tolerance Patient tolerated treatment well    Behavior During Therapy [facility] for tasks assessed/performed            Past Medical History:  Diagnosis Date   Acute lower GI bleeding 01/19/2020   Arthritis    Atrial fibrillation with rapid ventricular response (HCC)    Chicken pox    Chronic systolic CHF (congestive heart failure) ([place])    a. 03/2016 Echo: Ef 15-20%, diff HK, ant AK;  b. 05/2016 Echo: Ef 30-35%, diff HK, mildly dil LA/RA.   Diabetes mellitus without complication (HCC)    Dysrhythmia    GERD (gastroesophageal reflux disease)    Heart murmur    Hip discomfort    been going on for 40 years    History of kidney stones    Hyperlipidemia associated with type 2 diabetes mellitus ([place]) 05/12/2021   Hypertension    Moderate mitral regurgitation    a. 03/2016 Echo: mod MR in setting of LV dysfxn.   Motion sickness    car - back seat   NICM (nonischemic cardiomyopathy) ([place])    a. 03/2016 Echo: EF 15-20%, diff HK, ant AK, mod MR, mod dil LA, mildly dil RA;  b. 04/2016 Cath: nl cors;  c. 05/2016 Echo: EF 30-35%, diff HK.   Obesity    Obstructive sleep apnea    compliant with CPAP   Persistent atrial fibrillation ([place])    a. Dx 03/2016;  b. CHA2DS2VASc = 2-->Eliquis '5mg'$  BID;  b. 05/2016 Failed DCCV x 4.   Sleep apnea    Stomach irritation    Visit for monitoring Tikosyn therapy 07/24/2016   Past Surgical History:  Procedure Laterality Date   ATRIAL FIBRILLATION ABLATION N/A 12/09/2020   Procedure: ATRIAL FIBRILLATION ABLATION;  Surgeon: Vickie Epley, MD;  Location:  [place] [facility];  Service: Cardiovascular;  Laterality: N/A;   BIOPSY N/A 01/12/2020   Procedure: BIOPSY;  Surgeon: Virgel Manifold, MD;  Location: [place];  Service: Endoscopy;  Laterality: N/A;   CARDIAC CATHETERIZATION N/A 04/17/2016   Procedure: Left Heart Cath and Coronary Angiography;  Surgeon: Wellington Hampshire, MD;  Location: [place] [facility];  Service: Cardiovascular;  Laterality: N/A;   CARDIOVERSION N/A 10/22/2020   Procedure: CARDIOVERSION;  Surgeon: Nelva Bush, MD;  Location: [place] [facility];  Service: Cardiovascular;  Laterality: N/A;   CHOLECYSTECTOMY     COLON SURGERY  2021   COLONOSCOPY N/A 01/20/2020   Procedure: COLONOSCOPY;  Surgeon: Lesly Rubenstein, MD;  Location: [facility];  Service: Endoscopy;  Laterality: N/A;   COLONOSCOPY WITH PROPOFOL N/A 01/12/2020   Procedure: COLONOSCOPY WITH PROPOFOL;  Surgeon: Virgel Manifold, MD;  Location: [place];  Service: Endoscopy;  Laterality: N/A;  Diabetic - oral meds sleep apnea   ELECTROPHYSIOLOGIC STUDY N/A 05/26/2016   Procedure: Cardioversion;  Surgeon: Wellington Hampshire, MD;  Location: [facility];  Service: Cardiovascular;  Laterality: N/A;   ESOPHAGOGASTRODUODENOSCOPY (EGD) WITH PROPOFOL N/A 01/12/2020   Procedure:  ESOPHAGOGASTRODUODENOSCOPY (EGD) WITH PROPOFOL;  Surgeon: Virgel Manifold, MD;  Location: [place];  Service: Endoscopy;  Laterality: N/A;   POLYPECTOMY N/A 01/12/2020   Procedure: POLYPECTOMY;  Surgeon: Virgel Manifold, MD;  Location: [place];  Service: Endoscopy;  Laterality: N/A;   Patient Active Problem List   Diagnosis Date Noted   Pes anserinus bursitis of right knee 04/19/2022   Primary osteoarthritis of right knee 03/27/2022   Greater 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 03/27/2022   It band syndrome, right 03/27/2022   Hyperlipidemia associated with type 2 diabetes mellitus ([place]) 05/12/2021   Palmar fascial fibromatosis  ([place]) 04/01/2021   Varicose veins of leg with pain, bilateral 07/06/2020   Acquired thrombophilia ([place]) 05/24/2020   Type II diabetes mellitus with complication ([place]) 85/46/2703   Gastric polyp    Colon polyps    Varicose veins of both lower extremities 10/20/2019   Dysphagia 10/20/2019   Osteoarthritis of right hand 10/31/2016   Nonischemic cardiomyopathy ([place]) 04/14/2016   BMI 33.0-33.9,adult 04/14/2016   PCP: Dr. Halina Maidens  REFERRING PROVIDER: Dr. Rosette Reveal  REFERRING DIAG: M17.11 (ICD-10-CM) - Primary osteoarthritis of right knee, M76.31 (ICD-10-CM) - It band syndrome, right, M25.551 (ICD-10-CM) - Greater 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  RATIONALE FOR EVALUATION AND TREATMENT: Rehabilitation  THERAPY DIAG: Chronic pain of right knee  Pain in right hip  ONSET DATE: August 2023  FOLLOW-UP APPT SCHEDULED WITH REFERRING PROVIDER: Yes   FROM INITIAL EVALUATION SUBJECTIVE:                                                                                                                                                                                         SUBJECTIVE STATEMENT:  R hip and R knee pain  PERTINENT HISTORY:  Pt reports history of chronic R hip pain for decades with recent onset of R knee pain in August 2023. No known trauma but pt thinks she might have aggravated her knee walking across uneven grass. She saw Dr. Zigmund Daniel and eventually had steroid injections in her R greater trochanter, R knee injection, and R pes bursa on 04/18/22. Radiographs (03/14/22) of R knee showed mild degenerative changes at the patellofemoral joint, worse laterally. She receives chiropractic care every 2 weeks for her low back/hip pain and also sees massage therapy once/month.   PAIN:    Pain Intensity: Present: 1/10, Best: 0/10, Worst: 3/10 Pain location: Medial R knee pain Pain Quality: pressure, sharp Radiating: No  Numbness/Tingling: No Focal Weakness:  Yes, R knee feels unstable and feels like it will hyperextend Aggravating factors: Extended standing, standing after extended sitting, squatting,  Relieving factors: resting, ice, elevation, heat, antiinflammatories, using cane and knee brace; 24-hour pain behavior: activity-dependent How long can you stand: 10 minutes History of prior back, hip, knee injury, pain, surgery, or therapy: Yes, history of chronic back and R hip pain. No prior history of orthopedic surgery to back, hip, or knee. Dominant hand: right Imaging: Yes, see history Red flags: Negative for bowel/bladder changes, saddle paresthesia, personal history of cancer, h/o spinal tumors, h/o compression fx, h/o abdominal aneurysm, abdominal pain, chills/fever, night sweats, nausea, vomiting, unrelenting pain, first onset of insidious LBP <20 y/o  PRECAUTIONS: None  WEIGHT BEARING RESTRICTIONS: No  FALLS: Has patient fallen in last 6 months? No  Living Environment Lives with: lives alone Lives in: House/apartment Stairs: Yes: External: 3 steps; can reach both Has following equipment at home: Single point cane  Prior level of function: Independent  Occupational demands: Pt is an assessment specialist at [employer] (has to walk a long way to get to the testing room).   Hobbies: Pottery (currently not limited but initially had trouble sitting at the wheel), gardening/yard work;  Patient Goals: walking across the yard with improved confidence, lifting heavier objects at home without pain in hip  OBJECTIVE:  Patient Surveys  LEFS 39/80 FOTO 52, predicted improvement to 75  Cognition Patient is oriented to person, place, and time.  Recent memory is intact.  Remote memory is intact.  Attention span and concentration are intact.  Expressive speech is intact.  Patient's fund of knowledge is within normal limits for educational level.    Gross Musculoskeletal Assessment Tremor: None Bulk: Normal Tone: Normal  GAIT: Full  gait assessment deferred however gait is notably antalgic on RLE  Posture: Forward head/rounded shoulders in sitting, full posture assessment deferre;  AROM AROM (Normal range in degrees) AROM    Hip Right Left  Flexion (125) WNL WNL  Extension (15)    Abduction (40)    Adduction     Internal Rotation (45)    External Rotation (45)        Knee    Flexion (135) WNL WNL  Extension (0) WNL WNL      Ankle    Dorsiflexion (20)    Plantarflexion (50)    Inversion (35)    Eversion (15    (* = pain; Blank rows = not tested)  LE MMT: MMT (out of 5) Right  Left   Hip flexion 4 4+  Hip extension 4- 4  Hip abduction 4- 4  Hip adduction 4- 4+  Hip internal rotation 4 5  Hip external rotation 4 5  Knee flexion 4 4+  Knee extension 5 5  Ankle dorsiflexion 4+ 4+  Ankle plantarflexion    Ankle inversion    Ankle eversion    (* = pain; Blank rows = not tested)  Sensation Pt reports decreased sensation in RLE L4-L5 but otherwise intact and symmetrical. Proprioception, and hot/cold testing deferred on this date.  Reflexes Deferred  Muscle Length Hamstrings: R: Positive for tightness at 80 degrees L: Positive for tightness at 80 degrees  Quadriceps Pat Patrick): R: Not examined L: Not examined Hip flexors Marcello Moores): R: Not examined L: Not examined IT band Nicoletta Dress): R: Not examined L: Not examined  Palpation Location LEFT  RIGHT           Quadriceps  0  Medial Hamstrings  0  Lateral Hamstrings  0  Lateral Hamstring tendon  0  Medial Hamstring tendon  1  Quadriceps tendon  0  Patella  0  Patellar Tendon  0  Tibial Tuberosity  0  Medial joint line  1  Lateral joint line  0  MCL  1  LCL  0  Adductor Tubercle  1  Pes Anserine tendon  1  Infrapatellar fat pad  0  Fibular head  0  Popliteal fossa  0  (Blank rows = not tested) Graded on 0-4 scale (0 = no pain, 1 = pain, 2 = pain with wincing/grimacing/flinching, 3 = pain with withdrawal, 4 = unwilling to allow palpation), (Blank  rows = not tested)  Passive Accessory Motion Deferred PAM testing of knee  VASCULAR Deferred  SPECIAL TESTS  Ligamentous Stability  ACL: Lachman's: R: Negative L: Not examined Anterior Drawer: R: Negative L: Not examined Pivot Shift: R: Negative L: Not examined  PCL: Posterior Drawer: R: Negative L: Not examined Reverse Lachman's: R: Negative L: Not examined Posterior Sag Sign: R: Negative L: Not examined  MCL: Valgus Stress (30 degrees flexion): R: Negative L: Not examined  LCL: Varus Stress (30 degrees flexion): R: Negative L: Not examined  Meniscus Tests McMurray's Test:  Medial Meniscus (Tibial ER): R: Negative L: Not examined Lateral Meniscus (Tibial IR): R: Negative L: Not examined Thessaly: R: Not examined L: Not examined Steinmann Sign I: R: Negative L: Not examined  Patellofemoral Pain Syndrome Patellar Tilt (Lateral): R: Negative L: Not examined Patellar Apprehension: R: Negative L: Not examined Squatting pain: R: Not examined L: Not examined Stair climbing pain: R: Negative L: Not examined Kneeling pain: R: Positive L: Not examined Resisted knee extension pain: R: Negative L: Not examined  Passive Accessory Intervertebral Motion Pt denies reproduction of hip or knee pain with CPA L1-L5 and UPA bilaterally L1-L5. Generally, hypomobile throughout with tenderness along spine.  Special Tests Lumbar Radiculopathy and Discogenic: Centralization and Peripheralization (SN 92, -LR 0.12): Not examined Slump (SN 83, -LR 0.32): R: Negative L: Negative SLR (SN 92, -LR 0.29): R: Negative L:  Negative Crossed SLR (SP 90): R: Negative L: Not examined  Facet Joint: Extension-Rotation (SN 100, -LR 0.0): R: Negative L: Negative  Lumbar Foraminal Stenosis: Lumbar quadrant (SN 70): R: Negative L: Negative  Hip: FABER (SN 81): R: Positive L: Negative FADIR (SN 94): R: Positive L: Negative Hip scour (SN 50): R: Negative L: Negative Figure 4: R: Negative L: Positive  for lateral L hip pain  SIJ:  Thigh Thrust (SN 88, -LR 0.18) : R: Not examined L: Not examined  Piriformis Syndrome: FAIR Test (SN 88, SP 83): R: Not examined L: Not examined  Functional Tasks Deferred  Patellar Tendinopathy Inferior pole palpation with anterior tilt: R: Negative L: Not examined   Beighton Scale: Deferred, denies history of hypermobility   TODAY'S TREATMENT    SUBJECTIVE: Pt reports that she is doing well today. Notes bilateral knee soreness which she rates as 1/10 currently. No specific questions or concerns upon arrival today.   PAIN: 1/10 bilateral knee pain   Ther-ex  NuStep L1-3 x 6 minutes for LE strengthening and warm-up   Mini squats x 10, extensive verbal cues and demonstration for proper technique; Resisted side stepping with green tband around knees x 8 lengths; Seated clams with green tband 2 x 15 BLE; Seated adductor ball squeeze 2 x 15 BLE; Seated LAQ with 5# ankle weights (AW) 2 x 15 BLE; Seated HS curls with green tband 2 x 10 BLE;  Standing exercises with 5# AW: Hip flexion marches x 15 BLE; Hip abduction x 15  BLE; HS curls x 15 BLE; Hip extension x 15 BLE;  6" alternating forward step-ups with 5# AW x 10 with each LE; Forward BOSU (round side up) lunges x 10 with each foot forward; HEP reviewed with patient;   Not performed Supine R quad set over towel roll 5s hold x 10; Supine R SLR x 5, discontinued secondary to R knee and hip pain; Hooklying marching with green tband around knees 2 x 10 BLE; Hooklying R SAQ over bolster with manual resistance 2 x 10; Hooklying bridges with arms across chest and green tband around knees 2 x 10; Sidelying straight knee hip abduction 2 x 10 BLE;   PATIENT EDUCATION:  Education details: Pt educated throughout session about proper posture and technique with exercises. Improved exercise technique, movement at target joints, use of target muscles after min to mod verbal, visual, tactile cues.  HEP Person educated: Patient Education method: Explanation and handout Education comprehension: verbalized understanding and returned demonstration   HOME EXERCISE PROGRAM:  Access Code: [email or web address] URL: [email or web address] Date: 05/23/2022 Prepared by: Roxana Hires  Exercises - Supine Quad Set (Mirrored)  - 1 x daily - 7 x weekly - 2 sets - 10 reps - 5s hold - Supine Bridge  - 1 x daily - 7 x weekly - 2 sets - 10 reps - 2s hold - Supine March with Resistance Band  - 1 x daily - 7 x weekly - 2 sets - 10 reps - 2s hold - Seated Hip Adduction Isometrics with Ball  - 1 x daily - 7 x weekly - 2 sets - 10 reps - 2s hold - Seated Hip Abduction with Resistance  - 1 x daily - 7 x weekly - 2 sets - 10 reps - 2s hold - Standing Hip Abduction with Resistance at Ankles and Counter Support  - 1 x daily - 7 x weekly - 2 sets - 10 reps - 2s hold - Standing Hip Extension with Resistance at Counter Support  - 1 x daily - 7 x weekly - 2 sets - 10 reps - 2 hold   GOALS: Goals reviewed with patient? Yes  SHORT TERM GOALS: Target date: 06/08/2022  Pt will be independent with HEP to improve strength and decrease knee pain to improve pain-free function at home and work. Baseline:  Goal status: INITIAL   LONG TERM GOALS: Target date: 07/06/2022  Pt will increase FOTO to at least 61 to demonstrate significant improvement in function at home and work related to knee pain  Baseline: 05/11/22: 52 Goal status: INITIAL  2.  Pt will be able to bend and squat without an increase in her R knee pain in order to resume working in her yard/garden with less pain Baseline: 05/11/22: 3/10 R knee/hip pain Goal status: INITIAL  3.  Pt will increase LEFS score by at least 9 points in order demonstrate clinically significant reduction in knee pain/disability.       Baseline: 05/11/22: 39/80 Goal status: INITIAL  4.  Pt will increase strength of R hip abduction to at least 4+/5 MMT grade in order to  demonstrate improvement in strength and function and improved stability at right knee . Baseline: 05/11/22: 4-/5 Goal status: INITIAL   ASSESSMENT:  CLINICAL IMPRESSION: Pt demonstrates excellent motivation and effort during session today. Progressed strengthening and included additional standing exercises today. Pt denies any increase in knee or hip pain during session but does report muscle fatigue. No HEP updates at this time but reinforced current  program and will consider modifying HEP at next visit. Pt encouraged to follow-up as scheduled. Pt will benefit from PT services to address deficits in strength and range of motion in order to return to full function at home with less pain.  OBJECTIVE IMPAIRMENTS: Abnormal gait, decreased strength, and pain.   ACTIVITY LIMITATIONS: lifting and squatting  PARTICIPATION LIMITATIONS: cleaning, shopping, community activity, occupation, and yard work  PERSONAL FACTORS: Age, Time since onset of injury/illness/exacerbation, and 3+ comorbidities: DM, obesity, OSA, and afib  are also affecting patient's functional outcome.   REHAB POTENTIAL: Fair    CLINICAL DECISION MAKING: Unstable/unpredictable  EVALUATION COMPLEXITY: High   PLAN: PT FREQUENCY: 1-2x/week  PT DURATION: 8 weeks  PLANNED INTERVENTIONS: Therapeutic exercises, Therapeutic activity, Neuromuscular re-education, Balance training, Gait training, Patient/Family education, Self Care, Joint mobilization, Joint manipulation, Vestibular training, Canalith repositioning, Orthotic/Fit training, DME instructions, Dry Needling, Electrical stimulation, Spinal manipulation, Spinal mobilization, Cryotherapy, Moist heat, Taping, Traction, Ultrasound, Ionotophoresis '4mg'$ /ml Dexamethasone, Manual therapy, and Re-evaluation.  PLAN FOR NEXT SESSION: Progress strengthening for RLE, review/modify HEP as needed;  Lyndel Safe Wanette Robison PT, DPT, GCS  Kylen Schliep, PT 05/26/2022, 2:11 PM

## 2022-05-29 NOTE — Therapy (Signed)
OUTPATIENT PHYSICAL THERAPY KNEE TREATMENT  Patient Name: Brandy [name] MRN: [id number] DOB:[birth year], 67 y.o., female Today's Date: 05/30/2022  END OF SESSION:  PT End of Session - 05/30/22 1311     Visit Number 5    Number of Visits 17    Date for PT Re-Evaluation 07/06/22    Authorization Type eval: 05/11/22    PT Start Time 1315    PT Stop Time 1400    PT Time Calculation (min) 45 min    Activity Tolerance Patient tolerated treatment well    Behavior During Therapy [facility] for tasks assessed/performed            Past Medical History:  Diagnosis Date   Acute lower GI bleeding 01/19/2020   Arthritis    Atrial fibrillation with rapid ventricular response (HCC)    Chicken pox    Chronic systolic CHF (congestive heart failure) ([place])    a. 03/2016 Echo: Ef 15-20%, diff HK, ant AK;  b. 05/2016 Echo: Ef 30-35%, diff HK, mildly dil LA/RA.   Diabetes mellitus without complication (HCC)    Dysrhythmia    GERD (gastroesophageal reflux disease)    Heart murmur    Hip discomfort    been going on for 40 years    History of kidney stones    Hyperlipidemia associated with type 2 diabetes mellitus ([place]) 05/12/2021   Hypertension    Moderate mitral regurgitation    a. 03/2016 Echo: mod MR in setting of LV dysfxn.   Motion sickness    car - back seat   NICM (nonischemic cardiomyopathy) ([place])    a. 03/2016 Echo: EF 15-20%, diff HK, ant AK, mod MR, mod dil LA, mildly dil RA;  b. 04/2016 Cath: nl cors;  c. 05/2016 Echo: EF 30-35%, diff HK.   Obesity    Obstructive sleep apnea    compliant with CPAP   Persistent atrial fibrillation ([place])    a. Dx 03/2016;  b. CHA2DS2VASc = 2-->Eliquis '5mg'$  BID;  b. 05/2016 Failed DCCV x 4.   Sleep apnea    Stomach irritation    Visit for monitoring Tikosyn therapy 07/24/2016   Past Surgical History:  Procedure Laterality Date   ATRIAL FIBRILLATION ABLATION N/A 12/09/2020   Procedure: ATRIAL FIBRILLATION ABLATION;  Surgeon: Vickie Epley, MD;  Location:  [facility];  Service: Cardiovascular;  Laterality: N/A;   BIOPSY N/A 01/12/2020   Procedure: BIOPSY;  Surgeon: Virgel Manifold, MD;  Location: [facility];  Service: Endoscopy;  Laterality: N/A;   CARDIAC CATHETERIZATION N/A 04/17/2016   Procedure: Left Heart Cath and Coronary Angiography;  Surgeon: Wellington Hampshire, MD;  Location: [facility];  Service: Cardiovascular;  Laterality: N/A;   CARDIOVERSION N/A 10/22/2020   Procedure: CARDIOVERSION;  Surgeon: Nelva Bush, MD;  Location: [facility];  Service: Cardiovascular;  Laterality: N/A;   CHOLECYSTECTOMY     COLON SURGERY  2021   COLONOSCOPY N/A 01/20/2020   Procedure: COLONOSCOPY;  Surgeon: Lesly Rubenstein, MD;  Location: [facility];  Service: Endoscopy;  Laterality: N/A;   COLONOSCOPY WITH PROPOFOL N/A 01/12/2020   Procedure: COLONOSCOPY WITH PROPOFOL;  Surgeon: Virgel Manifold, MD;  Location: [facility];  Service: Endoscopy;  Laterality: N/A;  Diabetic - oral meds sleep apnea   ELECTROPHYSIOLOGIC STUDY N/A 05/26/2016   Procedure: Cardioversion;  Surgeon: Wellington Hampshire, MD;  Location: [facility];  Service: Cardiovascular;  Laterality: N/A;   ESOPHAGOGASTRODUODENOSCOPY (EGD) WITH PROPOFOL N/A 01/12/2020   Procedure:  ESOPHAGOGASTRODUODENOSCOPY (EGD) WITH PROPOFOL;  Surgeon: Virgel Manifold, MD;  Location: [facility];  Service: Endoscopy;  Laterality: N/A;   POLYPECTOMY N/A 01/12/2020   Procedure: POLYPECTOMY;  Surgeon: Virgel Manifold, MD;  Location: [place];  Service: Endoscopy;  Laterality: N/A;   Patient Active Problem List   Diagnosis Date Noted   Pes anserinus bursitis of right knee 04/19/2022   Primary osteoarthritis of right knee 03/27/2022   Greater 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 03/27/2022   It band syndrome, right 03/27/2022   Hyperlipidemia associated with type 2 diabetes mellitus ([place]) 05/12/2021   Palmar fascial fibromatosis  (dupuytren) 04/01/2021   Varicose veins of leg with pain, bilateral 07/06/2020   Acquired thrombophilia ([place]) 05/24/2020   Type II diabetes mellitus with complication ([place]) 60/02/9322   Gastric polyp    Colon polyps    Varicose veins of both lower extremities 10/20/2019   Dysphagia 10/20/2019   Osteoarthritis of right hand 10/31/2016   Nonischemic cardiomyopathy ([place]) 04/14/2016   BMI 33.0-33.9,adult 04/14/2016   PCP: Dr. Halina Maidens  REFERRING PROVIDER: Dr. Rosette Reveal  REFERRING DIAG: M17.11 (ICD-10-CM) - Primary osteoarthritis of right knee, M76.31 (ICD-10-CM) - It band syndrome, right, M25.551 (ICD-10-CM) - Greater 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  RATIONALE FOR EVALUATION AND TREATMENT: Rehabilitation  THERAPY DIAG: Chronic pain of right knee  Pain in right hip  ONSET DATE: August 2023  FOLLOW-UP APPT SCHEDULED WITH REFERRING PROVIDER: Yes   FROM INITIAL EVALUATION SUBJECTIVE:                                                                                                                                                                                         SUBJECTIVE STATEMENT:  R hip and R knee pain  PERTINENT HISTORY:  Pt reports history of chronic R hip pain for decades with recent onset of R knee pain in August 2023. No known trauma but pt thinks she might have aggravated her knee walking across uneven grass. She saw Dr. Zigmund Daniel and eventually had steroid injections in her R greater trochanter, R knee injection, and R pes bursa on 04/18/22. Radiographs (03/14/22) of R knee showed mild degenerative changes at the patellofemoral joint, worse laterally. She receives chiropractic care every 2 weeks for her low back/hip pain and also sees massage therapy once/month.   PAIN:    Pain Intensity: Present: 1/10, Best: 0/10, Worst: 3/10 Pain location: Medial R knee pain Pain Quality: pressure, sharp Radiating: No  Numbness/Tingling: No Focal Weakness:  Yes, R knee feels unstable and feels like it will hyperextend Aggravating factors: Extended standing, standing after extended sitting, squatting,  Relieving factors: resting, ice, elevation, heat, antiinflammatories, using cane and knee brace; 24-hour pain behavior: activity-dependent How long can you stand: 10 minutes History of prior back, hip, knee injury, pain, surgery, or therapy: Yes, history of chronic back and R hip pain. No prior history of orthopedic surgery to back, hip, or knee. Dominant hand: right Imaging: Yes, see history Red flags: Negative for bowel/bladder changes, saddle paresthesia, personal history of cancer, h/o spinal tumors, h/o compression fx, h/o abdominal aneurysm, abdominal pain, chills/fever, night sweats, nausea, vomiting, unrelenting pain, first onset of insidious LBP <20 y/o  PRECAUTIONS: None  WEIGHT BEARING RESTRICTIONS: No  FALLS: Has patient fallen in last 6 months? 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   Hobbies: Pottery (currently not limited but initially had trouble sitting at the wheel), gardening/yard work;  Patient Goals: walking across the yard with improved confidence, lifting heavier objects at home without pain in hip  OBJECTIVE:  Patient Surveys  LEFS 39/80 FOTO 52, predicted improvement to 34  Cognition Patient is oriented to person, place, and time.  Recent memory is intact.  Remote memory is intact.  Attention span and concentration are intact.  Expressive speech is intact.  Patient's fund of knowledge is within normal limits for educational level.    Gross Musculoskeletal Assessment Tremor: None Bulk: Normal Tone: Normal  GAIT: Full  gait assessment deferred however gait is notably antalgic on RLE  Posture: Forward head/rounded shoulders in sitting, full posture assessment deferre;  AROM AROM (Normal range in degrees) AROM    Hip Right Left  Flexion (125) WNL WNL  Extension (15)    Abduction (40)    Adduction     Internal Rotation (45)    External Rotation (45)        Knee    Flexion (135) WNL WNL  Extension (0) WNL WNL      Ankle    Dorsiflexion (20)    Plantarflexion (50)    Inversion (35)    Eversion (15    (* = pain; Blank rows = not tested)  LE MMT: MMT (out of 5) Right  Left   Hip flexion 4 4+  Hip extension 4- 4  Hip abduction 4- 4  Hip adduction 4- 4+  Hip internal rotation 4 5  Hip external rotation 4 5  Knee flexion 4 4+  Knee extension 5 5  Ankle dorsiflexion 4+ 4+  Ankle plantarflexion    Ankle inversion    Ankle eversion    (* = pain; Blank rows = not tested)  Sensation Pt reports decreased sensation in RLE L4-L5 but otherwise intact and symmetrical. Proprioception, and hot/cold testing deferred on this date.  Reflexes Deferred  Muscle Length Hamstrings: R: Positive for tightness at 80 degrees L: Positive for tightness at 80 degrees  Quadriceps Pat Patrick): R: Not examined L: Not examined Hip flexors Marcello Moores): R: Not examined L: Not examined IT band Nicoletta Dress): R: Not examined L: Not examined  Palpation Location LEFT  RIGHT           Quadriceps  0  Medial Hamstrings  0  Lateral Hamstrings  0  Lateral Hamstring tendon  0  Medial Hamstring tendon  1  Quadriceps tendon  0  Patella  0  Patellar Tendon  0  Tibial Tuberosity  0  Medial joint line  1  Lateral joint line  0  MCL  1  LCL  0  Adductor Tubercle  1  Pes Anserine tendon  1  Infrapatellar fat pad  0  Fibular head  0  Popliteal fossa  0  (Blank rows = not tested) Graded on 0-4 scale (0 = no pain, 1 = pain, 2 = pain with wincing/grimacing/flinching, 3 = pain with withdrawal, 4 = unwilling to allow palpation), (Blank  rows = not tested)  Passive Accessory Motion Deferred PAM testing of knee  VASCULAR Deferred  SPECIAL TESTS  Ligamentous Stability  ACL: Lachman's: R: Negative L: Not examined Anterior Drawer: R: Negative L: Not examined Pivot Shift: R: Negative L: Not examined  PCL: Posterior Drawer: R: Negative L: Not examined Reverse Lachman's: R: Negative L: Not examined Posterior Sag Sign: R: Negative L: Not examined  MCL: Valgus Stress (30 degrees flexion): R: Negative L: Not examined  LCL: Varus Stress (30 degrees flexion): R: Negative L: Not examined  Meniscus Tests McMurray's Test:  Medial Meniscus (Tibial ER): R: Negative L: Not examined Lateral Meniscus (Tibial IR): R: Negative L: Not examined Thessaly: R: Not examined L: Not examined Steinmann Sign I: R: Negative L: Not examined  Patellofemoral Pain Syndrome Patellar Tilt (Lateral): R: Negative L: Not examined Patellar Apprehension: R: Negative L: Not examined Squatting pain: R: Not examined L: Not examined Stair climbing pain: R: Negative L: Not examined Kneeling pain: R: Positive L: Not examined Resisted knee extension pain: R: Negative L: Not examined  Passive Accessory Intervertebral Motion Pt denies reproduction of hip or knee pain with CPA L1-L5 and UPA bilaterally L1-L5. Generally, hypomobile throughout with tenderness along spine.  Special Tests Lumbar Radiculopathy and Discogenic: Centralization and Peripheralization (SN 92, -LR 0.12): Not examined Slump (SN 83, -LR 0.32): R: Negative L: Negative SLR (SN 92, -LR 0.29): R: Negative L:  Negative Crossed SLR (SP 90): R: Negative L: Not examined  Facet Joint: Extension-Rotation (SN 100, -LR 0.0): R: Negative L: Negative  Lumbar Foraminal Stenosis: Lumbar quadrant (SN 70): R: Negative L: Negative  Hip: FABER (SN 81): R: Positive L: Negative FADIR (SN 94): R: Positive L: Negative Hip scour (SN 50): R: Negative L: Negative Figure 4: R: Negative L: Positive  for lateral L hip pain  SIJ:  Thigh Thrust (SN 88, -LR 0.18) : R: Not examined L: Not examined  Piriformis Syndrome: FAIR Test (SN 88, SP 83): R: Not examined L: Not examined  Functional Tasks Deferred  Patellar Tendinopathy Inferior pole palpation with anterior tilt: R: Negative L: Not examined   Beighton Scale: Deferred, denies history of hypermobility   TODAY'S TREATMENT    SUBJECTIVE: Pt reports that she is doing well today. She strained her R lower back this morning. L knee pain upon arrival which she rates as 4/10 currently. No specific questions upon arrival today.   PAIN: 4/10 L knee pain;   Ther-ex  NuStep L1-4 x 6 minutes for LE strengthening and warm-up with moist heat pack on low back while obtaining interval history;  Supine R quad set over towel roll 5s hold x 10; Supine R SLR x 5, discontinued secondary to R knee and hip pain; Hooklying marching with green tband around knees 2 x 10 BLE; Hooklying R SAQ over bolster with manual resistance 2 x 10; Hooklying clams with manual resistance from therapist 2 x 10; Hooklying adductor squeeze with  manual resistance from therapist 2 x 10; Hooklying bridges with arms across chest and green tband around knees 2 x 10; Supine heel slide with manual resistance from therapist 2 x 10; Sidelying straight knee hip abduction 2 x 10 BLE; Seated HS curls with blue tband 2 x 10 BLE; Seated heel raises with manual resistance 2 x 10 BLE; Total Gym Level 22 mini squats 2 x 10;   Not performed: Resisted side stepping with green tband around knees x 8 lengths; Seated clams with green tband 2 x 15 BLE; Seated adductor ball squeeze 2 x 15 BLE; Seated LAQ with 5# ankle weights (AW) 2 x 15 BLE; Standing exercises with 5# AW: Hip flexion marches x 15 BLE; Hip abduction x 15 BLE; HS curls x 15 BLE; Hip extension x 15 BLE; 6" alternating forward step-ups with 5# AW x 10 with each LE; Forward BOSU (round side up) lunges x 10 with each  foot forward;   PATIENT EDUCATION:  Education details: Pt educated throughout session about proper posture and technique with exercises. Improved exercise technique, movement at target joints, use of target muscles after min to mod verbal, visual, tactile cues. Person educated: Patient Education method: Explanation Education comprehension: verbalized understanding and returned demonstration   HOME EXERCISE PROGRAM:  Access Code: [email or web address] URL: [email or web address] Date: 05/23/2022 Prepared by: Roxana Hires  Exercises - Supine Quad Set (Mirrored)  - 1 x daily - 7 x weekly - 2 sets - 10 reps - 5s hold - Supine Bridge  - 1 x daily - 7 x weekly - 2 sets - 10 reps - 2s hold - Supine March with Resistance Band  - 1 x daily - 7 x weekly - 2 sets - 10 reps - 2s hold - Seated Hip Adduction Isometrics with Ball  - 1 x daily - 7 x weekly - 2 sets - 10 reps - 2s hold - Seated Hip Abduction with Resistance  - 1 x daily - 7 x weekly - 2 sets - 10 reps - 2s hold - Standing Hip Abduction with Resistance at Ankles and Counter Support  - 1 x daily - 7 x weekly - 2 sets - 10 reps - 2s hold - Standing Hip Extension with Resistance at Counter Support  - 1 x daily - 7 x weekly - 2 sets - 10 reps - 2 hold   GOALS: Goals reviewed with patient? Yes  SHORT TERM GOALS: Target date: 06/08/2022  Pt will be independent with HEP to improve strength and decrease knee pain to improve pain-free function at home and work. Baseline:  Goal status: INITIAL   LONG TERM GOALS: Target date: 07/06/2022  Pt will increase FOTO to at least 61 to demonstrate significant improvement in function at home and work related to knee pain  Baseline: 05/11/22: 52 Goal status: INITIAL  2.  Pt will be able to bend and squat without an increase in her R knee pain in order to resume working in her yard/garden with less pain Baseline: 05/11/22: 3/10 R knee/hip pain Goal status: INITIAL  3.  Pt will increase LEFS  score by at least 9 points in order demonstrate clinically significant reduction in knee pain/disability.       Baseline: 05/11/22: 39/80 Goal status: INITIAL  4.  Pt will increase strength of R hip abduction to at least 4+/5 MMT grade in order to demonstrate improvement in strength and function and improved stability at right knee . Baseline: 05/11/22: 4-/5 Goal status: INITIAL  ASSESSMENT:  CLINICAL IMPRESSION: Pt demonstrates excellent motivation and effort during session today. Due to back pain returned to mat table strengthening today. Pt denies any increase in knee or hip pain during session. No HEP updates at this time but reinforced current program and will consider modifying HEP at next visit. Pt encouraged to follow-up as scheduled. Pt will benefit from PT services to address deficits in strength and range of motion in order to return to full function at home with less pain.  OBJECTIVE IMPAIRMENTS: Abnormal gait, decreased strength, and pain.   ACTIVITY LIMITATIONS: lifting and squatting  PARTICIPATION LIMITATIONS: cleaning, shopping, community activity, occupation, and yard work  PERSONAL FACTORS: Age, Time since onset of injury/illness/exacerbation, and 3+ comorbidities: DM, obesity, OSA, and afib  are also affecting patient's functional outcome.   REHAB POTENTIAL: Fair    CLINICAL DECISION MAKING: Unstable/unpredictable  EVALUATION COMPLEXITY: High   PLAN: PT FREQUENCY: 1-2x/week  PT DURATION: 8 weeks  PLANNED INTERVENTIONS: Therapeutic exercises, Therapeutic activity, Neuromuscular re-education, Balance training, Gait training, Patient/Family education, Self Care, Joint mobilization, Joint manipulation, Vestibular training, Canalith repositioning, Orthotic/Fit training, DME instructions, Dry Needling, Electrical stimulation, Spinal manipulation, Spinal mobilization, Cryotherapy, Moist heat, Taping, Traction, Ultrasound, Ionotophoresis '4mg'$ /ml Dexamethasone, Manual  therapy, and Re-evaluation.  PLAN FOR NEXT SESSION: Progress strengthening for RLE, review/modify HEP as needed;  Lyndel Safe Eydan Chianese PT, DPT, GCS  Reyanna Baley, PT 05/30/2022, 1:57 PM

## 2022-05-30 ENCOUNTER — Ambulatory Visit: Payer: Medicare Other

## 2022-05-30 DIAGNOSIS — M25551 Pain in right hip: Secondary | ICD-10-CM | POA: Diagnosis not present

## 2022-05-30 DIAGNOSIS — M25561 Pain in right knee: Secondary | ICD-10-CM | POA: Diagnosis not present

## 2022-05-30 DIAGNOSIS — G8929 Other chronic pain: Secondary | ICD-10-CM

## 2022-06-01 NOTE — Therapy (Signed)
OUTPATIENT PHYSICAL THERAPY KNEE TREATMENT  Patient Name: Brandy [name] MRN: [id number] DOB:1955-12-24, 67 y.o., female Today's Date: 06/02/2022  END OF SESSION:  PT End of Session - 06/02/22 0927     Visit Number 6    Number of Visits 17    Date for PT Re-Evaluation 07/06/22    Authorization Type eval: 05/11/22    PT Start Time 0927    PT Stop Time 1015    PT Time Calculation (min) 48 min    Activity Tolerance Patient tolerated treatment well    Behavior During Therapy [facility] for tasks assessed/performed            Past Medical History:  Diagnosis Date   Acute lower GI bleeding 01/19/2020   Arthritis    Atrial fibrillation with rapid ventricular response (HCC)    Chicken pox    Chronic systolic CHF (congestive heart failure) ([place])    a. 03/2016 Echo: Ef 15-20%, diff HK, ant AK;  b. 05/2016 Echo: Ef 30-35%, diff HK, mildly dil LA/RA.   Diabetes mellitus without complication (HCC)    Dysrhythmia    GERD (gastroesophageal reflux disease)    Heart murmur    Hip discomfort    been going on for 40 years    History of kidney stones    Hyperlipidemia associated with type 2 diabetes mellitus ([place]) 05/12/2021   Hypertension    Moderate mitral regurgitation    a. 03/2016 Echo: mod MR in setting of LV dysfxn.   Motion sickness    car - back seat   NICM (nonischemic cardiomyopathy) ([place])    a. 03/2016 Echo: EF 15-20%, diff HK, ant AK, mod MR, mod dil LA, mildly dil RA;  b. 04/2016 Cath: nl cors;  c. 05/2016 Echo: EF 30-35%, diff HK.   Obesity    Obstructive sleep apnea    compliant with CPAP   Persistent atrial fibrillation ([place])    a. Dx 03/2016;  b. CHA2DS2VASc = 2-->Eliquis '5mg'$  BID;  b. 05/2016 Failed DCCV x 4.   Sleep apnea    Stomach irritation    Visit for monitoring Tikosyn therapy 07/24/2016   Past Surgical History:  Procedure Laterality Date   ATRIAL FIBRILLATION ABLATION N/A 12/09/2020   Procedure: ATRIAL FIBRILLATION ABLATION;  Surgeon: Vickie Epley, MD;  Location:  [place] [facility];  Service: Cardiovascular;  Laterality: N/A;   BIOPSY N/A 01/12/2020   Procedure: BIOPSY;  Surgeon: Virgel Manifold, MD;  Location: [place];  Service: Endoscopy;  Laterality: N/A;   CARDIAC CATHETERIZATION N/A 04/17/2016   Procedure: Left Heart Cath and Coronary Angiography;  Surgeon: Wellington Hampshire, MD;  Location: [place] [facility];  Service: Cardiovascular;  Laterality: N/A;   CARDIOVERSION N/A 10/22/2020   Procedure: CARDIOVERSION;  Surgeon: Nelva Bush, MD;  Location: [facility];  Service: Cardiovascular;  Laterality: N/A;   CHOLECYSTECTOMY     COLON SURGERY  2021   COLONOSCOPY N/A 01/20/2020   Procedure: COLONOSCOPY;  Surgeon: Lesly Rubenstein, MD;  Location: [facility];  Service: Endoscopy;  Laterality: N/A;   COLONOSCOPY WITH PROPOFOL N/A 01/12/2020   Procedure: COLONOSCOPY WITH PROPOFOL;  Surgeon: Virgel Manifold, MD;  Location: [place];  Service: Endoscopy;  Laterality: N/A;  Diabetic - oral meds sleep apnea   ELECTROPHYSIOLOGIC STUDY N/A 05/26/2016   Procedure: Cardioversion;  Surgeon: Wellington Hampshire, MD;  Location: [facility];  Service: Cardiovascular;  Laterality: N/A;   ESOPHAGOGASTRODUODENOSCOPY (EGD) WITH PROPOFOL N/A 01/12/2020   Procedure:  ESOPHAGOGASTRODUODENOSCOPY (EGD) WITH PROPOFOL;  Surgeon: Virgel Manifold, MD;  Location: [place];  Service: Endoscopy;  Laterality: N/A;   POLYPECTOMY N/A 01/12/2020   Procedure: POLYPECTOMY;  Surgeon: Virgel Manifold, MD;  Location: [place];  Service: Endoscopy;  Laterality: N/A;   Patient Active Problem List   Diagnosis Date Noted   Pes anserinus bursitis of right knee 04/19/2022   Primary osteoarthritis of right knee 03/27/2022   Greater 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 03/27/2022   It band syndrome, right 03/27/2022   Hyperlipidemia associated with type 2 diabetes mellitus ([place]) 05/12/2021   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 Halina Maidens  REFERRING PROVIDER: Dr. Rosette Reveal  REFERRING DIAG: M17.11 (ICD-10-CM) - Primary osteoarthritis of right knee, M76.31 (ICD-10-CM) - It band syndrome, right, M25.551 (ICD-10-CM) - Greater 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  RATIONALE FOR EVALUATION AND TREATMENT: Rehabilitation  THERAPY DIAG: Chronic pain of right knee  Pain in right hip  ONSET DATE: August 2023  FOLLOW-UP APPT SCHEDULED WITH REFERRING PROVIDER: Yes   FROM INITIAL EVALUATION SUBJECTIVE:                                                                                                                                                                                         SUBJECTIVE STATEMENT:  R hip and R knee pain  PERTINENT HISTORY:  Pt reports history of chronic R hip pain for decades with recent onset of R knee pain in August 2023. No known trauma but pt thinks she might have aggravated her knee walking across uneven grass. She saw Dr. Zigmund Daniel and eventually had steroid injections in her R greater trochanter, R knee injection, and R pes bursa on 04/18/22. Radiographs (03/14/22) of R knee showed mild degenerative changes at the patellofemoral joint, worse laterally. She receives chiropractic care every 2 weeks for her low back/hip pain and also sees massage therapy once/month.   PAIN:    Pain Intensity: Present: 1/10, Best: 0/10, Worst: 3/10 Pain location: Medial R knee pain Pain Quality: pressure, sharp Radiating: No  Numbness/Tingling: No Focal Weakness:  Yes, R knee feels unstable and feels like it will hyperextend Aggravating factors: Extended standing, standing after extended sitting, squatting,  Relieving factors: resting, ice, elevation, heat, antiinflammatories, using cane and knee brace; 24-hour pain behavior: activity-dependent How long can you stand: 10 minutes History of prior back, hip, knee injury, pain, surgery, or therapy: Yes, history of chronic back and R hip pain. No prior history of orthopedic surgery to back, hip, or knee. Dominant hand: right Imaging: Yes, see history Red flags: Negative for bowel/bladder changes, saddle paresthesia, personal history of cancer, h/o spinal tumors, h/o compression fx, h/o abdominal aneurysm, abdominal pain, chills/fever, night sweats, nausea, vomiting, unrelenting pain, first onset of insidious LBP <19 y/o  PRECAUTIONS: None  WEIGHT BEARING RESTRICTIONS: No  FALLS: Has patient fallen in last 6 months? No  Living Environment Lives with: lives alone Lives in: House/apartment Stairs: Yes: External: 3 steps; can reach both Has following equipment at home: Single point cane  Prior level of function: Independent  Occupational demands: Pt is an assessment specialist at [employer] (has to walk a long way to get to the testing room).   Hobbies: Pottery (currently not limited but initially had trouble sitting at the wheel), gardening/yard work;  Patient Goals: walking across the yard with improved confidence, lifting heavier objects at home without pain in hip  OBJECTIVE:  Patient Surveys  LEFS 39/80 FOTO 52, predicted improvement to 60  Cognition Patient is oriented to person, place, and time.  Recent memory is intact.  Remote memory is intact.  Attention span and concentration are intact.  Expressive speech is intact.  Patient's fund of knowledge is within normal limits for educational level.    Gross Musculoskeletal Assessment Tremor: None Bulk: Normal Tone: Normal  GAIT: Full  gait assessment deferred however gait is notably antalgic on RLE  Posture: Forward head/rounded shoulders in sitting, full posture assessment deferre;  AROM AROM (Normal range in degrees) AROM    Hip Right Left  Flexion (125) WNL WNL  Extension (15)    Abduction (40)    Adduction     Internal Rotation (45)    External Rotation (45)        Knee    Flexion (135) WNL WNL  Extension (0) WNL WNL      Ankle    Dorsiflexion (20)    Plantarflexion (50)    Inversion (35)    Eversion (15    (* = pain; Blank rows = not tested)  LE MMT: MMT (out of 5) Right  Left   Hip flexion 4 4+  Hip extension 4- 4  Hip abduction 4- 4  Hip adduction 4- 4+  Hip internal rotation 4 5  Hip external rotation 4 5  Knee flexion 4 4+  Knee extension 5 5  Ankle dorsiflexion 4+ 4+  Ankle plantarflexion    Ankle inversion    Ankle eversion    (* = pain; Blank rows = not tested)  Sensation Pt reports decreased sensation in RLE L4-L5 but otherwise intact and symmetrical. Proprioception, and hot/cold testing deferred on this date.  Reflexes Deferred  Muscle Length Hamstrings: R: Positive for tightness at 80 degrees L: Positive for tightness at 80 degrees  Quadriceps Pat Patrick): R: Not examined L: Not examined Hip flexors Marcello Moores): R: Not examined L: Not examined IT band Nicoletta Dress): R: Not examined L: Not examined  Palpation Location LEFT  RIGHT           Quadriceps  0  Medial Hamstrings  0  Lateral Hamstrings  0  Lateral Hamstring tendon  0  Medial Hamstring tendon  1  Quadriceps tendon  0  Patella  0  Patellar Tendon  0  Tibial Tuberosity  0  Medial joint line  1  Lateral joint line  0  MCL  1  LCL  0  Adductor Tubercle  1  Pes Anserine tendon  1  Infrapatellar fat pad  0  Fibular head  0  Popliteal fossa  0  (Blank rows = not tested) Graded on 0-4 scale (0 = no pain, 1 = pain, 2 = pain with wincing/grimacing/flinching, 3 = pain with withdrawal, 4 = unwilling to allow palpation), (Blank  rows = not tested)  Passive Accessory Motion Deferred PAM testing of knee  VASCULAR Deferred  SPECIAL TESTS  Ligamentous Stability  ACL: Lachman's: R: Negative L: Not examined Anterior Drawer: R: Negative L: Not examined Pivot Shift: R: Negative L: Not examined  PCL: Posterior Drawer: R: Negative L: Not examined Reverse Lachman's: R: Negative L: Not examined Posterior Sag Sign: R: Negative L: Not examined  MCL: Valgus Stress (30 degrees flexion): R: Negative L: Not examined  LCL: Varus Stress (30 degrees flexion): R: Negative L: Not examined  Meniscus Tests McMurray's Test:  Medial Meniscus (Tibial ER): R: Negative L: Not examined Lateral Meniscus (Tibial IR): R: Negative L: Not examined Thessaly: R: Not examined L: Not examined Steinmann Sign I: R: Negative L: Not examined  Patellofemoral Pain Syndrome Patellar Tilt (Lateral): R: Negative L: Not examined Patellar Apprehension: R: Negative L: Not examined Squatting pain: R: Not examined L: Not examined Stair climbing pain: R: Negative L: Not examined Kneeling pain: R: Positive L: Not examined Resisted knee extension pain: R: Negative L: Not examined  Passive Accessory Intervertebral Motion Pt denies reproduction of hip or knee pain with CPA L1-L5 and UPA bilaterally L1-L5. Generally, hypomobile throughout with tenderness along spine.  Special Tests Lumbar Radiculopathy and Discogenic: Centralization and Peripheralization (SN 92, -LR 0.12): Not examined Slump (SN 83, -LR 0.32): R: Negative L: Negative SLR (SN 92, -LR 0.29): R: Negative L:  Negative Crossed SLR (SP 90): R: Negative L: Not examined  Facet Joint: Extension-Rotation (SN 100, -LR 0.0): R: Negative L: Negative  Lumbar Foraminal Stenosis: Lumbar quadrant (SN 70): R: Negative L: Negative  Hip: FABER (SN 81): R: Positive L: Negative FADIR (SN 94): R: Positive L: Negative Hip scour (SN 50): R: Negative L: Negative Figure 4: R: Negative L: Positive  for lateral L hip pain  SIJ:  Thigh Thrust (SN 88, -LR 0.18) : R: Not examined L: Not examined  Piriformis Syndrome: FAIR Test (SN 88, SP 83): R: Not examined L: Not examined  Functional Tasks Deferred  Patellar Tendinopathy Inferior pole palpation with anterior tilt: R: Negative L: Not examined   Beighton Scale: Deferred, denies history of hypermobility   TODAY'S TREATMENT    SUBJECTIVE: Pt reports that she is doing alright today. L hip/thigh pain rated at 3/10 upon arrival. No specific questions upon arrival today.   PAIN: 3/10 L hip/thigh pain;   Ther-ex  NuStep L1-4 x 6 minutes for LE strengthening and warm-up with moist heat pack on low back while obtaining interval history;  Supine quad sets over towel roll 5s hold x 15 RLE, 10 LLE; Supine SLR x 10 BLE; Hooklying marching with green tband around knees 2 x 10 BLE; Hooklying clams with green tband 2 x 10; Hooklying adductor squeeze with manual resistance from therapist 2 x 10; Hooklying bridges with arms across chest and green tband around knees 2 x 10; Hooklying R SAQ over bolster with manual resistance 2  x 10; Supine heel slide with manual resistance from therapist 2 x 10 BLE; Sidelying straight knee hip abduction with 2# ankle weights (AW) 2 x 10 BLE; Sidelying reverse clams with 2# AW x 10 LLE, 2 x 10 RLE; Total Gym (TG) Level 22 (L22) mini squats 2 x 10; TG L22 heel raises 2 x 10;   Not performed: Resisted side stepping with green tband around knees x 8 lengths; Seated clams with green tband 2 x 15 BLE; Seated adductor ball squeeze 2 x 15 BLE; Seated LAQ with 5# ankle weights (AW) 2 x 15 BLE; Standing exercises with 5# AW: Hip flexion marches x 15 BLE; Hip abduction x 15 BLE; HS curls x 15 BLE; Hip extension x 15 BLE; 6" alternating forward step-ups with 5# AW x 10 with each LE; Forward BOSU (round side up) lunges x 10 with each foot forward;   PATIENT EDUCATION:  Education details: Pt educated  throughout session about proper posture and technique with exercises. Improved exercise technique, movement at target joints, use of target muscles after min to mod verbal, visual, tactile cues. Person educated: Patient Education method: Explanation Education comprehension: verbalized understanding and returned demonstration   HOME EXERCISE PROGRAM:  Access Code: [email or web address] URL: [email or web address] Date: 06/02/2022 Prepared by: Roxana Hires  Exercises - Supine Quad Set (Mirrored)  - 1 x daily - 7 x weekly - 2 sets - 10 reps - 5s hold - Supine Bridge  - 1 x daily - 7 x weekly - 2 sets - 10 reps - 2s hold - Supine March with Resistance Band  - 1 x daily - 7 x weekly - 2 sets - 10 reps - 2s hold - Seated Hip Adduction Isometrics with Ball  - 1 x daily - 7 x weekly - 2 sets - 10 reps - 2s hold - Seated Hip Abduction with Resistance  - 1 x daily - 7 x weekly - 2 sets - 10 reps - 2s hold - Standing Hip Abduction with Resistance at Ankles and Counter Support  - 1 x daily - 7 x weekly - 2 sets - 10 reps - 2s hold - Standing Hip Extension with Resistance at Counter Support  - 1 x daily - 7 x weekly - 2 sets - 10 reps - 2 hold - Mini Squat with Counter Support  - 1 x daily - 7 x weekly - 2 sets - 10 reps   GOALS: Goals reviewed with patient? Yes  SHORT TERM GOALS: Target date: 06/08/2022  Pt will be independent with HEP to improve strength and decrease knee pain to improve pain-free function at home and work. Baseline:  Goal status: INITIAL   LONG TERM GOALS: Target date: 07/06/2022  Pt will increase FOTO to at least 61 to demonstrate significant improvement in function at home and work related to knee pain  Baseline: 05/11/22: 52 Goal status: INITIAL  2.  Pt will be able to bend and squat without an increase in her R knee pain in order to resume working in her yard/garden with less pain Baseline: 05/11/22: 3/10 R knee/hip pain Goal status: INITIAL  3.  Pt will increase  LEFS score by at least 9 points in order demonstrate clinically significant reduction in knee pain/disability.       Baseline: 05/11/22: 39/80 Goal status: INITIAL  4.  Pt will increase strength of R hip abduction to at least 4+/5 MMT grade in order to demonstrate improvement in strength and function and improved stability at  right knee . Baseline: 05/11/22: 4-/5 Goal status: INITIAL   ASSESSMENT:  CLINICAL IMPRESSION: Pt demonstrates excellent motivation and effort during session today. She denies any increase in knee or hip pain during session. Progressed strengthening and updated HEP during session. Pt encouraged to follow-up as scheduled. She will benefit from PT services to address deficits in strength and range of motion in order to return to full function at home with less pain.  OBJECTIVE IMPAIRMENTS: Abnormal gait, decreased strength, and pain.   ACTIVITY LIMITATIONS: lifting and squatting  PARTICIPATION LIMITATIONS: cleaning, shopping, community activity, occupation, and yard work  PERSONAL FACTORS: Age, Time since onset of injury/illness/exacerbation, and 3+ comorbidities: DM, obesity, OSA, and afib  are also affecting patient's functional outcome.   REHAB POTENTIAL: Fair    CLINICAL DECISION MAKING: Unstable/unpredictable  EVALUATION COMPLEXITY: High   PLAN: PT FREQUENCY: 1-2x/week  PT DURATION: 8 weeks  PLANNED INTERVENTIONS: Therapeutic exercises, Therapeutic activity, Neuromuscular re-education, Balance training, Gait training, Patient/Family education, Self Care, Joint mobilization, Joint manipulation, Vestibular training, Canalith repositioning, Orthotic/Fit training, DME instructions, Dry Needling, Electrical stimulation, Spinal manipulation, Spinal mobilization, Cryotherapy, Moist heat, Taping, Traction, Ultrasound, Ionotophoresis '4mg'$ /ml Dexamethasone, Manual therapy, and Re-evaluation.  PLAN FOR NEXT SESSION: Progress strengthening for RLE, review/modify HEP as  needed;  Lyndel Safe Wynema Garoutte PT, DPT, GCS  Kayliee Atienza, PT 06/02/2022, 10:23 AM

## 2022-06-02 ENCOUNTER — Ambulatory Visit: Payer: Medicare Other

## 2022-06-02 DIAGNOSIS — M25561 Pain in right knee: Secondary | ICD-10-CM | POA: Diagnosis not present

## 2022-06-02 DIAGNOSIS — G8929 Other chronic pain: Secondary | ICD-10-CM | POA: Diagnosis not present

## 2022-06-02 DIAGNOSIS — M25551 Pain in right hip: Secondary | ICD-10-CM

## 2022-06-02 NOTE — Therapy (Incomplete)
OUTPATIENT PHYSICAL THERAPY KNEE TREATMENT  Patient Name: Brandy [name] MRN: [id number] DOB:10/02/1955, 67 y.o., female Today's Date: 06/02/2022  END OF SESSION:   Past Medical History:  Diagnosis Date   Acute lower GI bleeding 01/19/2020   Arthritis    Atrial fibrillation with rapid ventricular response (HCC)    Chicken pox    Chronic systolic CHF (congestive heart failure) ([place])    a. 03/2016 Echo: Ef 15-20%, diff HK, ant AK;  b. 05/2016 Echo: Ef 30-35%, diff HK, mildly dil LA/RA.   Diabetes mellitus without complication (HCC)    Dysrhythmia    GERD (gastroesophageal reflux disease)    Heart murmur    Hip discomfort    been going on for 40 years    History of kidney stones    Hyperlipidemia associated with type 2 diabetes mellitus ([place]) 05/12/2021   Hypertension    Moderate mitral regurgitation    a. 03/2016 Echo: mod MR in setting of LV dysfxn.   Motion sickness    car - back seat   NICM (nonischemic cardiomyopathy) ([place])    a. 03/2016 Echo: EF 15-20%, diff HK, ant AK, mod MR, mod dil LA, mildly dil RA;  b. 04/2016 Cath: nl cors;  c. 05/2016 Echo: EF 30-35%, diff HK.   Obesity    Obstructive sleep apnea    compliant with CPAP   Persistent atrial fibrillation ([place])    a. Dx 03/2016;  b. CHA2DS2VASc = 2-->Eliquis '5mg'$  BID;  b. 05/2016 Failed DCCV x 4.   Sleep apnea    Stomach irritation    Visit for monitoring Tikosyn therapy 07/24/2016   Past Surgical History:  Procedure Laterality Date   ATRIAL FIBRILLATION ABLATION N/A 12/09/2020   Procedure: ATRIAL FIBRILLATION ABLATION;  Surgeon: Vickie Epley, MD;  Location: [facility];  Service: Cardiovascular;  Laterality: N/A;   BIOPSY N/A 01/12/2020   Procedure: BIOPSY;  Surgeon: Virgel Manifold, MD;  Location: [facility];  Service: Endoscopy;  Laterality: N/A;   CARDIAC CATHETERIZATION N/A 04/17/2016   Procedure: Left Heart Cath and Coronary Angiography;  Surgeon: Wellington Hampshire, MD;  Location: [facility];  Service: Cardiovascular;  Laterality: N/A;   CARDIOVERSION N/A 10/22/2020   Procedure: CARDIOVERSION;  Surgeon: Nelva Bush, MD;  Location: [facility];  Service: Cardiovascular;  Laterality: N/A;   CHOLECYSTECTOMY     COLON SURGERY  2021   COLONOSCOPY N/A 01/20/2020   Procedure: COLONOSCOPY;  Surgeon: Lesly Rubenstein, MD;  Location: [facility];  Service: Endoscopy;  Laterality: N/A;   COLONOSCOPY WITH PROPOFOL N/A 01/12/2020   Procedure: COLONOSCOPY WITH PROPOFOL;  Surgeon: Virgel Manifold, MD;  Location: [place] [facility];  Service: Endoscopy;  Laterality: N/A;  Diabetic - oral meds sleep apnea   ELECTROPHYSIOLOGIC STUDY N/A 05/26/2016   Procedure: Cardioversion;  Surgeon: Wellington Hampshire, MD;  Location: [facility];  Service: Cardiovascular;  Laterality: N/A;   ESOPHAGOGASTRODUODENOSCOPY (EGD) WITH PROPOFOL N/A 01/12/2020   Procedure: ESOPHAGOGASTRODUODENOSCOPY (EGD) WITH PROPOFOL;  Surgeon: Virgel Manifold, MD;  Location: [place];  Service: Endoscopy;  Laterality: N/A;   POLYPECTOMY N/A 01/12/2020   Procedure: POLYPECTOMY;  Surgeon: Virgel Manifold, MD;  Location: [place];  Service: Endoscopy;  Laterality: N/A;   Patient Active Problem List   Diagnosis Date Noted   Pes anserinus bursitis of right knee 04/19/2022   Primary osteoarthritis of right knee 03/27/2022   Greater 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 03/27/2022  It band syndrome, right 03/27/2022   Hyperlipidemia associated with type 2 diabetes mellitus ([place]) 05/12/2021   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 Halina Maidens  REFERRING PROVIDER: Dr. Rosette Reveal  REFERRING DIAG: M17.11 (ICD-10-CM) - Primary osteoarthritis of right knee, M76.31 (ICD-10-CM) - It band syndrome, right, M25.551 (ICD-10-CM) - Greater 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  RATIONALE FOR EVALUATION AND TREATMENT: Rehabilitation  THERAPY DIAG: Chronic pain of right knee  Pain in right hip  ONSET DATE: August 2023  FOLLOW-UP APPT SCHEDULED WITH REFERRING PROVIDER: Yes   FROM INITIAL EVALUATION SUBJECTIVE:                                                                                                                                                                                         SUBJECTIVE STATEMENT:  R hip and R knee pain  PERTINENT HISTORY:  Pt reports history of chronic R hip pain for decades with recent onset of R knee pain in August 2023. No known trauma but pt thinks she might have aggravated her knee walking across uneven grass. She saw Dr. Zigmund Daniel and eventually had steroid injections in her R greater trochanter, R knee injection, and R pes bursa on 04/18/22. Radiographs (03/14/22) of R knee showed mild degenerative changes at the patellofemoral joint, worse laterally. She receives chiropractic care every 2 weeks for her low back/hip pain and also sees massage therapy once/month.   PAIN:    Pain Intensity: Present: 1/10, Best: 0/10, Worst: 3/10 Pain location: Medial R knee pain Pain Quality: pressure, sharp Radiating: No  Numbness/Tingling: No Focal Weakness: Yes, R knee feels unstable and feels like it will hyperextend Aggravating factors: Extended standing, standing after extended sitting, squatting,  Relieving factors: resting, ice, elevation, heat, antiinflammatories, using cane and knee brace; 24-hour pain behavior: activity-dependent How long can you stand: 10 minutes History of prior back, hip, knee  injury, pain, surgery, or therapy: Yes, history of chronic back and R hip pain. No prior history of orthopedic surgery to back, hip, or knee. Dominant hand: right Imaging: Yes, see history Red flags: Negative for bowel/bladder changes, saddle paresthesia, personal history of cancer, h/o spinal tumors, h/o compression fx, h/o abdominal aneurysm, abdominal pain, chills/fever, night sweats,  nausea, vomiting, unrelenting pain, first onset of insidious LBP <19 y/o  PRECAUTIONS: None  WEIGHT BEARING RESTRICTIONS: No  FALLS: Has patient fallen in last 6 months? No  Living Environment Lives with: lives alone Lives in: House/apartment Stairs: Yes: External: 3 steps; can reach both Has following equipment at home: Single point cane  Prior level of function: Independent  Occupational demands: Pt is an assessment specialist at [employer] (has to walk a long way to get to the testing room).   Hobbies: Pottery (currently not limited but initially had trouble sitting at the wheel), gardening/yard work;  Patient Goals: walking across the yard with improved confidence, lifting heavier objects at home without pain in hip  OBJECTIVE:  Patient Surveys  LEFS 39/80 FOTO 52, predicted improvement to 69  Cognition Patient is oriented to person, place, and time.  Recent memory is intact.  Remote memory is intact.  Attention span and concentration are intact.  Expressive speech is intact.  Patient's fund of knowledge is within normal limits for educational level.    Gross Musculoskeletal Assessment Tremor: None Bulk: Normal Tone: Normal  GAIT: Full gait assessment deferred however gait is notably antalgic on RLE  Posture: Forward head/rounded shoulders in sitting, full posture assessment deferre;  AROM AROM (Normal range in degrees) AROM    Hip Right Left  Flexion (125) WNL WNL  Extension (15)    Abduction (40)    Adduction     Internal Rotation (45)    External Rotation (45)        Knee     Flexion (135) WNL WNL  Extension (0) WNL WNL      Ankle    Dorsiflexion (20)    Plantarflexion (50)    Inversion (35)    Eversion (15    (* = pain; Blank rows = not tested)  LE MMT: MMT (out of 5) Right  Left   Hip flexion 4 4+  Hip extension 4- 4  Hip abduction 4- 4  Hip adduction 4- 4+  Hip internal rotation 4 5  Hip external rotation 4 5  Knee flexion 4 4+  Knee extension 5 5  Ankle dorsiflexion 4+ 4+  Ankle plantarflexion    Ankle inversion    Ankle eversion    (* = pain; Blank rows = not tested)  Sensation Pt reports decreased sensation in RLE L4-L5 but otherwise intact and symmetrical. Proprioception, and hot/cold testing deferred on this date.  Reflexes Deferred  Muscle Length Hamstrings: R: Positive for tightness at 80 degrees L: Positive for tightness at 80 degrees  Quadriceps Pat Patrick): R: Not examined L: Not examined Hip flexors Marcello Moores): R: Not examined L: Not examined IT band Nicoletta Dress): R: Not examined L: Not examined  Palpation Location LEFT  RIGHT           Quadriceps  0  Medial Hamstrings  0  Lateral Hamstrings  0  Lateral Hamstring tendon  0  Medial Hamstring tendon  1  Quadriceps tendon  0  Patella  0  Patellar Tendon  0  Tibial Tuberosity  0  Medial joint line  1  Lateral joint line  0  MCL  1  LCL  0  Adductor Tubercle  1  Pes Anserine tendon  1  Infrapatellar fat pad  0  Fibular head  0  Popliteal fossa  0  (Blank rows = not tested) Graded on 0-4 scale (0 = no pain, 1 = pain, 2 = pain with wincing/grimacing/flinching, 3 = pain with  withdrawal, 4 = unwilling to allow palpation), (Blank rows = not tested)  Passive Accessory Motion Deferred PAM testing of knee  VASCULAR Deferred  SPECIAL TESTS  Ligamentous Stability  ACL: Lachman's: R: Negative L: Not examined Anterior Drawer: R: Negative L: Not examined Pivot Shift: R: Negative L: Not examined  PCL: Posterior Drawer: R: Negative L: Not examined Reverse Lachman's: R: Negative  L: Not examined Posterior Sag Sign: R: Negative L: Not examined  MCL: Valgus Stress (30 degrees flexion): R: Negative L: Not examined  LCL: Varus Stress (30 degrees flexion): R: Negative L: Not examined  Meniscus Tests McMurray's Test:  Medial Meniscus (Tibial ER): R: Negative L: Not examined Lateral Meniscus (Tibial IR): R: Negative L: Not examined Thessaly: R: Not examined L: Not examined Steinmann Sign I: R: Negative L: Not examined  Patellofemoral Pain Syndrome Patellar Tilt (Lateral): R: Negative L: Not examined Patellar Apprehension: R: Negative L: Not examined Squatting pain: R: Not examined L: Not examined Stair climbing pain: R: Negative L: Not examined Kneeling pain: R: Positive L: Not examined Resisted knee extension pain: R: Negative L: Not examined  Passive Accessory Intervertebral Motion Pt denies reproduction of hip or knee pain with CPA L1-L5 and UPA bilaterally L1-L5. Generally, hypomobile throughout with tenderness along spine.  Special Tests Lumbar Radiculopathy and Discogenic: Centralization and Peripheralization (SN 92, -LR 0.12): Not examined Slump (SN 83, -LR 0.32): R: Negative L: Negative SLR (SN 92, -LR 0.29): R: Negative L:  Negative Crossed SLR (SP 90): R: Negative L: Not examined  Facet Joint: Extension-Rotation (SN 100, -LR 0.0): R: Negative L: Negative  Lumbar Foraminal Stenosis: Lumbar quadrant (SN 70): R: Negative L: Negative  Hip: FABER (SN 81): R: Positive L: Negative FADIR (SN 94): R: Positive L: Negative Hip scour (SN 50): R: Negative L: Negative Figure 4: R: Negative L: Positive for lateral L hip pain  SIJ:  Thigh Thrust (SN 88, -LR 0.18) : R: Not examined L: Not examined  Piriformis Syndrome: FAIR Test (SN 88, SP 83): R: Not examined L: Not examined  Functional Tasks Deferred  Patellar Tendinopathy Inferior pole palpation with anterior tilt: R: Negative L: Not examined   Beighton Scale: Deferred, denies history of  hypermobility   TODAY'S TREATMENT    SUBJECTIVE: Pt reports that she is doing alright today. L hip/thigh pain rated at 3/10 upon arrival. No specific questions upon arrival today.   PAIN: 3/10 L hip/thigh pain;   Ther-ex  NuStep L1-4 x 6 minutes for LE strengthening and warm-up with moist heat pack on low back while obtaining interval history;  Supine quad sets over towel roll 5s hold x 15 RLE, 10 LLE; Supine SLR x 10 BLE; Hooklying marching with green tband around knees 2 x 10 BLE; Hooklying clams with green tband 2 x 10; Hooklying adductor squeeze with manual resistance from therapist 2 x 10; Hooklying bridges with arms across chest and green tband around knees 2 x 10; Hooklying R SAQ over bolster with manual resistance 2 x 10; Supine heel slide with manual resistance from therapist 2 x 10 BLE; Sidelying straight knee hip abduction with 2# ankle weights (AW) 2 x 10 BLE; Sidelying reverse clams with 2# AW x 10 LLE, 2 x 10 RLE; Total Gym (TG) Level 22 (L22) mini squats 2 x 10; TG L22 heel raises 2 x 10;   Not performed: Resisted side stepping with green tband around knees x 8 lengths; Seated clams with green tband 2 x 15 BLE; Seated adductor ball  squeeze 2 x 15 BLE; Seated LAQ with 5# ankle weights (AW) 2 x 15 BLE; Standing exercises with 5# AW: Hip flexion marches x 15 BLE; Hip abduction x 15 BLE; HS curls x 15 BLE; Hip extension x 15 BLE; 6" alternating forward step-ups with 5# AW x 10 with each LE; Forward BOSU (round side up) lunges x 10 with each foot forward;   PATIENT EDUCATION:  Education details: Pt educated throughout session about proper posture and technique with exercises. Improved exercise technique, movement at target joints, use of target muscles after min to mod verbal, visual, tactile cues. Person educated: Patient Education method: Explanation Education comprehension: verbalized understanding and returned demonstration   HOME EXERCISE PROGRAM:   Access Code: [email or web address] URL: [email or web address] Date: 06/02/2022 Prepared by: Roxana Hires  Exercises - Supine Quad Set (Mirrored)  - 1 x daily - 7 x weekly - 2 sets - 10 reps - 5s hold - Supine Bridge  - 1 x daily - 7 x weekly - 2 sets - 10 reps - 2s hold - Supine March with Resistance Band  - 1 x daily - 7 x weekly - 2 sets - 10 reps - 2s hold - Seated Hip Adduction Isometrics with Ball  - 1 x daily - 7 x weekly - 2 sets - 10 reps - 2s hold - Seated Hip Abduction with Resistance  - 1 x daily - 7 x weekly - 2 sets - 10 reps - 2s hold - Standing Hip Abduction with Resistance at Ankles and Counter Support  - 1 x daily - 7 x weekly - 2 sets - 10 reps - 2s hold - Standing Hip Extension with Resistance at Counter Support  - 1 x daily - 7 x weekly - 2 sets - 10 reps - 2 hold - Mini Squat with Counter Support  - 1 x daily - 7 x weekly - 2 sets - 10 reps   GOALS: Goals reviewed with patient? Yes  SHORT TERM GOALS: Target date: 06/08/2022  Pt will be independent with HEP to improve strength and decrease knee pain to improve pain-free function at home and work. Baseline:  Goal status: INITIAL   LONG TERM GOALS: Target date: 07/06/2022  Pt will increase FOTO to at least 61 to demonstrate significant improvement in function at home and work related to knee pain  Baseline: 05/11/22: 52 Goal status: INITIAL  2.  Pt will be able to bend and squat without an increase in her R knee pain in order to resume working in her yard/garden with less pain Baseline: 05/11/22: 3/10 R knee/hip pain Goal status: INITIAL  3.  Pt will increase LEFS score by at least 9 points in order demonstrate clinically significant reduction in knee pain/disability.       Baseline: 05/11/22: 39/80 Goal status: INITIAL  4.  Pt will increase strength of R hip abduction to at least 4+/5 MMT grade in order to demonstrate improvement in strength and function and improved stability at right knee . Baseline:  05/11/22: 4-/5 Goal status: INITIAL   ASSESSMENT:  CLINICAL IMPRESSION: Pt demonstrates excellent motivation and effort during session today. She denies any increase in knee or hip pain during session. Progressed strengthening and updated HEP during session. Pt encouraged to follow-up as scheduled. She will benefit from PT services to address deficits in strength and range of motion in order to return to full function at home with less pain.  OBJECTIVE IMPAIRMENTS: Abnormal gait, decreased strength, and pain.  ACTIVITY LIMITATIONS: lifting and squatting  PARTICIPATION LIMITATIONS: cleaning, shopping, community activity, occupation, and yard work  PERSONAL FACTORS: Age, Time since onset of injury/illness/exacerbation, and 3+ comorbidities: DM, obesity, OSA, and afib  are also affecting patient's functional outcome.   REHAB POTENTIAL: Fair    CLINICAL DECISION MAKING: Unstable/unpredictable  EVALUATION COMPLEXITY: High   PLAN: PT FREQUENCY: 1-2x/week  PT DURATION: 8 weeks  PLANNED INTERVENTIONS: Therapeutic exercises, Therapeutic activity, Neuromuscular re-education, Balance training, Gait training, Patient/Family education, Self Care, Joint mobilization, Joint manipulation, Vestibular training, Canalith repositioning, Orthotic/Fit training, DME instructions, Dry Needling, Electrical stimulation, Spinal manipulation, Spinal mobilization, Cryotherapy, Moist heat, Taping, Traction, Ultrasound, Ionotophoresis '4mg'$ /ml Dexamethasone, Manual therapy, and Re-evaluation.  PLAN FOR NEXT SESSION: Progress strengthening for RLE, review/modify HEP as needed;  Lyndel Safe Lada Fulbright PT, DPT, GCS  Samad Thon, PT 06/02/2022, 2:42 PM

## 2022-06-06 ENCOUNTER — Ambulatory Visit: Payer: Medicare Other

## 2022-06-06 DIAGNOSIS — M25551 Pain in right hip: Secondary | ICD-10-CM

## 2022-06-06 DIAGNOSIS — M25561 Pain in right knee: Secondary | ICD-10-CM | POA: Diagnosis not present

## 2022-06-06 DIAGNOSIS — G8929 Other chronic pain: Secondary | ICD-10-CM

## 2022-06-06 NOTE — Therapy (Signed)
OUTPATIENT PHYSICAL THERAPY KNEE TREATMENT  Patient Name: Brandy [name] MRN: [id number] DOB:05/04/1956, 67 y.o., female Today's Date: 06/06/2022  END OF SESSION:  PT End of Session - 06/06/22 1153     Visit Number 7    Number of Visits 17    Date for PT Re-Evaluation 07/06/22    Authorization Type eval: 05/11/22    PT Start Time 1147    PT Stop Time 1230    PT Time Calculation (min) 43 min    Activity Tolerance Patient tolerated treatment well    Behavior During Therapy [facility] for tasks assessed/performed            Past Medical History:  Diagnosis Date   Acute lower GI bleeding 01/19/2020   Arthritis    Atrial fibrillation with rapid ventricular response (HCC)    Chicken pox    Chronic systolic CHF (congestive heart failure) ([place])    a. 03/2016 Echo: Ef 15-20%, diff HK, ant AK;  b. 05/2016 Echo: Ef 30-35%, diff HK, mildly dil LA/RA.   Diabetes mellitus without complication (HCC)    Dysrhythmia    GERD (gastroesophageal reflux disease)    Heart murmur    Hip discomfort    been going on for 40 years    History of kidney stones    Hyperlipidemia associated with type 2 diabetes mellitus ([place]) 05/12/2021   Hypertension    Moderate mitral regurgitation    a. 03/2016 Echo: mod MR in setting of LV dysfxn.   Motion sickness    car - back seat   NICM (nonischemic cardiomyopathy) ([place])    a. 03/2016 Echo: EF 15-20%, diff HK, ant AK, mod MR, mod dil LA, mildly dil RA;  b. 04/2016 Cath: nl cors;  c. 05/2016 Echo: EF 30-35%, diff HK.   Obesity    Obstructive sleep apnea    compliant with CPAP   Persistent atrial fibrillation ([place])    a. Dx 03/2016;  b. CHA2DS2VASc = 2-->Eliquis '5mg'$  BID;  b. 05/2016 Failed DCCV x 4.   Sleep apnea    Stomach irritation    Visit for monitoring Tikosyn therapy 07/24/2016   Past Surgical History:  Procedure Laterality Date   ATRIAL FIBRILLATION ABLATION N/A 12/09/2020   Procedure: ATRIAL FIBRILLATION ABLATION;  Surgeon: Vickie Epley, MD;  Location:  [place] [facility];  Service: Cardiovascular;  Laterality: N/A;   BIOPSY N/A 01/12/2020   Procedure: BIOPSY;  Surgeon: Virgel Manifold, MD;  Location: [place];  Service: Endoscopy;  Laterality: N/A;   CARDIAC CATHETERIZATION N/A 04/17/2016   Procedure: Left Heart Cath and Coronary Angiography;  Surgeon: Wellington Hampshire, MD;  Location: [place] [facility];  Service: Cardiovascular;  Laterality: N/A;   CARDIOVERSION N/A 10/22/2020   Procedure: CARDIOVERSION;  Surgeon: Nelva Bush, MD;  Location: [place] [facility];  Service: Cardiovascular;  Laterality: N/A;   CHOLECYSTECTOMY     COLON SURGERY  2021   COLONOSCOPY N/A 01/20/2020   Procedure: COLONOSCOPY;  Surgeon: Lesly Rubenstein, MD;  Location: [facility];  Service: Endoscopy;  Laterality: N/A;   COLONOSCOPY WITH PROPOFOL N/A 01/12/2020   Procedure: COLONOSCOPY WITH PROPOFOL;  Surgeon: Virgel Manifold, MD;  Location: [place];  Service: Endoscopy;  Laterality: N/A;  Diabetic - oral meds sleep apnea   ELECTROPHYSIOLOGIC STUDY N/A 05/26/2016   Procedure: Cardioversion;  Surgeon: Wellington Hampshire, MD;  Location: [facility];  Service: Cardiovascular;  Laterality: N/A;   ESOPHAGOGASTRODUODENOSCOPY (EGD) WITH PROPOFOL N/A 01/12/2020   Procedure:  ESOPHAGOGASTRODUODENOSCOPY (EGD) WITH PROPOFOL;  Surgeon: Virgel Manifold, MD;  Location: [place];  Service: Endoscopy;  Laterality: N/A;   POLYPECTOMY N/A 01/12/2020   Procedure: POLYPECTOMY;  Surgeon: Virgel Manifold, MD;  Location: [place];  Service: Endoscopy;  Laterality: N/A;   Patient Active Problem List   Diagnosis Date Noted   Pes anserinus bursitis of right knee 04/19/2022   Primary osteoarthritis of right knee 03/27/2022   Greater 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 03/27/2022   It band syndrome, right 03/27/2022   Hyperlipidemia associated with type 2 diabetes mellitus ([place]) 05/12/2021   Palmar fascial fibromatosis  (dupuytren) 04/01/2021   Varicose veins of leg with pain, bilateral 07/06/2020   Acquired thrombophilia ([place]) 05/24/2020   Type II diabetes mellitus with complication ([place]) 32/35/5732   Gastric polyp    Colon polyps    Varicose veins of both lower extremities 10/20/2019   Dysphagia 10/20/2019   Osteoarthritis of right hand 10/31/2016   Nonischemic cardiomyopathy ([place]) 04/14/2016   BMI 33.0-33.9,adult 04/14/2016   PCP: Dr. Halina Maidens  REFERRING PROVIDER: Dr. Rosette Reveal  REFERRING DIAG: M17.11 (ICD-10-CM) - Primary osteoarthritis of right knee, M76.31 (ICD-10-CM) - It band syndrome, right, M25.551 (ICD-10-CM) - Greater 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  RATIONALE FOR EVALUATION AND TREATMENT: Rehabilitation  THERAPY DIAG: Chronic pain of right knee  Pain in right hip  ONSET DATE: August 2023  FOLLOW-UP APPT SCHEDULED WITH REFERRING PROVIDER: Yes   FROM INITIAL EVALUATION SUBJECTIVE:                                                                                                                                                                                         SUBJECTIVE STATEMENT:  R hip and R knee pain  PERTINENT HISTORY:  Pt reports history of chronic R hip pain for decades with recent onset of R knee pain in August 2023. No known trauma but pt thinks she might have aggravated her knee walking across uneven grass. She saw Dr. Zigmund Daniel and eventually had steroid injections in her R greater trochanter, R knee injection, and R pes bursa on 04/18/22. Radiographs (03/14/22) of R knee showed mild degenerative changes at the patellofemoral joint, worse laterally. She receives chiropractic care every 2 weeks for her low back/hip pain and also sees massage therapy once/month.   PAIN:    Pain Intensity: Present: 1/10, Best: 0/10, Worst: 3/10 Pain location: Medial R knee pain Pain Quality: pressure, sharp Radiating: No  Numbness/Tingling: No Focal Weakness:  Yes, R knee feels unstable and feels like it will hyperextend Aggravating factors: Extended standing, standing after extended sitting, squatting,  Relieving factors: resting, ice, elevation, heat, antiinflammatories, using cane and knee brace; 24-hour pain behavior: activity-dependent How long can you stand: 10 minutes History of prior back, hip, knee injury, pain, surgery, or therapy: Yes, history of chronic back and R hip pain. No prior history of orthopedic surgery to back, hip, or knee. Dominant hand: right Imaging: Yes, see history Red flags: Negative for bowel/bladder changes, saddle paresthesia, personal history of cancer, h/o spinal tumors, h/o compression fx, h/o abdominal aneurysm, abdominal pain, chills/fever, night sweats, nausea, vomiting, unrelenting pain, first onset of insidious LBP <19 y/o  PRECAUTIONS: None  WEIGHT BEARING RESTRICTIONS: No  FALLS: Has patient fallen in last 6 months? No  Living Environment Lives with: lives alone Lives in: House/apartment Stairs: Yes: External: 3 steps; can reach both Has following equipment at home: Single point cane  Prior level of function: Independent  Occupational demands: Pt is an assessment specialist at [employer] (has to walk a long way to get to the testing room).   Hobbies: Pottery (currently not limited but initially had trouble sitting at the wheel), gardening/yard work;  Patient Goals: walking across the yard with improved confidence, lifting heavier objects at home without pain in hip  OBJECTIVE:  Patient Surveys  LEFS 39/80 FOTO 52, predicted improvement to 82  Cognition Patient is oriented to person, place, and time.  Recent memory is intact.  Remote memory is intact.  Attention span and concentration are intact.  Expressive speech is intact.  Patient's fund of knowledge is within normal limits for educational level.    Gross Musculoskeletal Assessment Tremor: None Bulk: Normal Tone: Normal  GAIT: Full  gait assessment deferred however gait is notably antalgic on RLE  Posture: Forward head/rounded shoulders in sitting, full posture assessment deferre;  AROM AROM (Normal range in degrees) AROM    Hip Right Left  Flexion (125) WNL WNL  Extension (15)    Abduction (40)    Adduction     Internal Rotation (45)    External Rotation (45)        Knee    Flexion (135) WNL WNL  Extension (0) WNL WNL      Ankle    Dorsiflexion (20)    Plantarflexion (50)    Inversion (35)    Eversion (15    (* = pain; Blank rows = not tested)  LE MMT: MMT (out of 5) Right  Left   Hip flexion 4 4+  Hip extension 4- 4  Hip abduction 4- 4  Hip adduction 4- 4+  Hip internal rotation 4 5  Hip external rotation 4 5  Knee flexion 4 4+  Knee extension 5 5  Ankle dorsiflexion 4+ 4+  Ankle plantarflexion    Ankle inversion    Ankle eversion    (* = pain; Blank rows = not tested)  Sensation Pt reports decreased sensation in RLE L4-L5 but otherwise intact and symmetrical. Proprioception, and hot/cold testing deferred on this date.  Reflexes Deferred  Muscle Length Hamstrings: R: Positive for tightness at 80 degrees L: Positive for tightness at 80 degrees  Quadriceps Pat Patrick): R: Not examined L: Not examined Hip flexors Marcello Moores): R: Not examined L: Not examined IT band Nicoletta Dress): R: Not examined L: Not examined  Palpation Location LEFT  RIGHT           Quadriceps  0  Medial Hamstrings  0  Lateral Hamstrings  0  Lateral Hamstring tendon  0  Medial Hamstring tendon  1  Quadriceps tendon  0  Patella  0  Patellar Tendon  0  Tibial Tuberosity  0  Medial joint line  1  Lateral joint line  0  MCL  1  LCL  0  Adductor Tubercle  1  Pes Anserine tendon  1  Infrapatellar fat pad  0  Fibular head  0  Popliteal fossa  0  (Blank rows = not tested) Graded on 0-4 scale (0 = no pain, 1 = pain, 2 = pain with wincing/grimacing/flinching, 3 = pain with withdrawal, 4 = unwilling to allow palpation), (Blank  rows = not tested)  Passive Accessory Motion Deferred PAM testing of knee  VASCULAR Deferred  SPECIAL TESTS  Ligamentous Stability  ACL: Lachman's: R: Negative L: Not examined Anterior Drawer: R: Negative L: Not examined Pivot Shift: R: Negative L: Not examined  PCL: Posterior Drawer: R: Negative L: Not examined Reverse Lachman's: R: Negative L: Not examined Posterior Sag Sign: R: Negative L: Not examined  MCL: Valgus Stress (30 degrees flexion): R: Negative L: Not examined  LCL: Varus Stress (30 degrees flexion): R: Negative L: Not examined  Meniscus Tests McMurray's Test:  Medial Meniscus (Tibial ER): R: Negative L: Not examined Lateral Meniscus (Tibial IR): R: Negative L: Not examined Thessaly: R: Not examined L: Not examined Steinmann Sign I: R: Negative L: Not examined  Patellofemoral Pain Syndrome Patellar Tilt (Lateral): R: Negative L: Not examined Patellar Apprehension: R: Negative L: Not examined Squatting pain: R: Not examined L: Not examined Stair climbing pain: R: Negative L: Not examined Kneeling pain: R: Positive L: Not examined Resisted knee extension pain: R: Negative L: Not examined  Passive Accessory Intervertebral Motion Pt denies reproduction of hip or knee pain with CPA L1-L5 and UPA bilaterally L1-L5. Generally, hypomobile throughout with tenderness along spine.  Special Tests Lumbar Radiculopathy and Discogenic: Centralization and Peripheralization (SN 92, -LR 0.12): Not examined Slump (SN 83, -LR 0.32): R: Negative L: Negative SLR (SN 92, -LR 0.29): R: Negative L:  Negative Crossed SLR (SP 90): R: Negative L: Not examined  Facet Joint: Extension-Rotation (SN 100, -LR 0.0): R: Negative L: Negative  Lumbar Foraminal Stenosis: Lumbar quadrant (SN 70): R: Negative L: Negative  Hip: FABER (SN 81): R: Positive L: Negative FADIR (SN 94): R: Positive L: Negative Hip scour (SN 50): R: Negative L: Negative Figure 4: R: Negative L: Positive  for lateral L hip pain  SIJ:  Thigh Thrust (SN 88, -LR 0.18) : R: Not examined L: Not examined  Piriformis Syndrome: FAIR Test (SN 88, SP 83): R: Not examined L: Not examined  Functional Tasks Deferred  Patellar Tendinopathy Inferior pole palpation with anterior tilt: R: Negative L: Not examined   Beighton Scale: Deferred, denies history of hypermobility   TODAY'S TREATMENT    SUBJECTIVE: Pt reports that she is doing alright today. "I did something to my L knee this weekend." Pt states she started having knee pain while walking this weekend but doesn't recall any specific injury. Pain starts just above the patella and extends down just below knee. She rates the pain as 1/10 upon arrival. No specific questions upon arrival today.   PAIN: 1/10 L knee pain   Ther-ex  NuStep L1-4 x 6 minutes for LE strengthening and warm-up while obtaining interval history and therapist adjusting resistance to alter difficulty;  Total Gym (TG) Level 22 (L22) mini squats 2 x 10; TG L22 heel raises 2 x 10;  Standing exercises with 5# ankle weights (AW)  Hip flexion marches x 10 BLE;  Hamstring curls x 10 BLE; Hip abduction x 10 BLE; Hip extension x 10 BLE;  Seated LAQ with 5# ankle weights (AW) 2 x 10 BLE; Seated clams with blue tband resistance 2 x 10; Seated ball squeeze 2 x 10;  Side stepping with 5# AW and blue tband around knees 12' x 4 lengths; Forward BOSU (round side up) step-ups x 10 with each LE; Forward BOSU (round side up) lunges x 10 with each foot forward;   Not performed: Supine quad sets over towel roll 5s hold x 15 RLE, 10 LLE; Supine SLR x 10 BLE; Hooklying marching with green tband around knees 2 x 10 BLE; Hooklying clams with green tband 2 x 10; Hooklying adductor squeeze with manual resistance from therapist 2 x 10; Hooklying bridges with arms across chest and green tband around knees 2 x 10; Hooklying R SAQ over bolster with manual resistance 2 x 10; Supine heel  slide with manual resistance from therapist 2 x 10 BLE; Sidelying straight knee hip abduction with 2# ankle weights (AW) 2 x 10 BLE; Sidelying reverse clams with 2# AW x 10 LLE, 2 x 10 RLE;    PATIENT EDUCATION:  Education details: Pt educated throughout session about proper posture and technique with exercises. Improved exercise technique, movement at target joints, use of target muscles after min to mod verbal, visual, tactile cues. Person educated: Patient Education method: Explanation Education comprehension: verbalized understanding and returned demonstration   HOME EXERCISE PROGRAM:  Access Code: [email or web address] URL: [email or web address] Date: 06/02/2022 Prepared by: Roxana Hires  Exercises - Supine Quad Set (Mirrored)  - 1 x daily - 7 x weekly - 2 sets - 10 reps - 5s hold - Supine Bridge  - 1 x daily - 7 x weekly - 2 sets - 10 reps - 2s hold - Supine March with Resistance Band  - 1 x daily - 7 x weekly - 2 sets - 10 reps - 2s hold - Seated Hip Adduction Isometrics with Ball  - 1 x daily - 7 x weekly - 2 sets - 10 reps - 2s hold - Seated Hip Abduction with Resistance  - 1 x daily - 7 x weekly - 2 sets - 10 reps - 2s hold - Standing Hip Abduction with Resistance at Ankles and Counter Support  - 1 x daily - 7 x weekly - 2 sets - 10 reps - 2s hold - Standing Hip Extension with Resistance at Counter Support  - 1 x daily - 7 x weekly - 2 sets - 10 reps - 2 hold - Mini Squat with Counter Support  - 1 x daily - 7 x weekly - 2 sets - 10 reps   GOALS: Goals reviewed with patient? Yes  SHORT TERM GOALS: Target date: 06/08/2022  Pt will be independent with HEP to improve strength and decrease knee pain to improve pain-free function at home and work. Baseline:  Goal status: INITIAL   LONG TERM GOALS: Target date: 07/06/2022  Pt will increase FOTO to at least 61 to demonstrate significant improvement in function at home and work related to knee pain  Baseline: 05/11/22:  52 Goal status: INITIAL  2.  Pt will be able to bend and squat without an increase in her R knee pain in order to resume working in her yard/garden with less pain Baseline: 05/11/22: 3/10 R knee/hip pain Goal status: INITIAL  3.  Pt will increase LEFS score by at least 9 points in order demonstrate clinically significant reduction  in knee pain/disability.       Baseline: 05/11/22: 39/80 Goal status: INITIAL  4.  Pt will increase strength of R hip abduction to at least 4+/5 MMT grade in order to demonstrate improvement in strength and function and improved stability at right knee . Baseline: 05/11/22: 4-/5 Goal status: INITIAL   ASSESSMENT:  CLINICAL IMPRESSION: Pt demonstrates excellent motivation and effort during session today. Progressed strengthening to additional upright exercises today and pt denies any increase in R hip or knee pain. She does continue with some L knee pain throughout session. No updates to HEP during session today. Pt encouraged to follow-up as scheduled. She will benefit from PT services to address deficits in strength and range of motion in order to return to full function at home with less pain.  OBJECTIVE IMPAIRMENTS: Abnormal gait, decreased strength, and pain.   ACTIVITY LIMITATIONS: lifting and squatting  PARTICIPATION LIMITATIONS: cleaning, shopping, community activity, occupation, and yard work  PERSONAL FACTORS: Age, Time since onset of injury/illness/exacerbation, and 3+ comorbidities: DM, obesity, OSA, and afib  are also affecting patient's functional outcome.   REHAB POTENTIAL: Fair    CLINICAL DECISION MAKING: Unstable/unpredictable  EVALUATION COMPLEXITY: High   PLAN: PT FREQUENCY: 1-2x/week  PT DURATION: 8 weeks  PLANNED INTERVENTIONS: Therapeutic exercises, Therapeutic activity, Neuromuscular re-education, Balance training, Gait training, Patient/Family education, Self Care, Joint mobilization, Joint manipulation, Vestibular training,  Canalith repositioning, Orthotic/Fit training, DME instructions, Dry Needling, Electrical stimulation, Spinal manipulation, Spinal mobilization, Cryotherapy, Moist heat, Taping, Traction, Ultrasound, Ionotophoresis '4mg'$ /ml Dexamethasone, Manual therapy, and Re-evaluation.  PLAN FOR NEXT SESSION: Progress strengthening for RLE, review/modify HEP as needed;  Lyndel Safe Eshani Maestre PT, DPT, GCS  Roddrick Sharron, PT 06/06/2022, 1:56 PM

## 2022-06-07 NOTE — Therapy (Incomplete)
OUTPATIENT PHYSICAL THERAPY KNEE TREATMENT  Patient Name: Brandy [name] MRN: [id number] DOB:06/08/1955, 67 y.o., female Today's Date: 06/07/2022  END OF SESSION:   Past Medical History:  Diagnosis Date   Acute lower GI bleeding 01/19/2020   Arthritis    Atrial fibrillation with rapid ventricular response (HCC)    Chicken pox    Chronic systolic CHF (congestive heart failure) ([place])    a. 03/2016 Echo: Ef 15-20%, diff HK, ant AK;  b. 05/2016 Echo: Ef 30-35%, diff HK, mildly dil LA/RA.   Diabetes mellitus without complication (HCC)    Dysrhythmia    GERD (gastroesophageal reflux disease)    Heart murmur    Hip discomfort    been going on for 40 years    History of kidney stones    Hyperlipidemia associated with type 2 diabetes mellitus ([place]) 05/12/2021   Hypertension    Moderate mitral regurgitation    a. 03/2016 Echo: mod MR in setting of LV dysfxn.   Motion sickness    car - back seat   NICM (nonischemic cardiomyopathy) ([place])    a. 03/2016 Echo: EF 15-20%, diff HK, ant AK, mod MR, mod dil LA, mildly dil RA;  b. 04/2016 Cath: nl cors;  c. 05/2016 Echo: EF 30-35%, diff HK.   Obesity    Obstructive sleep apnea    compliant with CPAP   Persistent atrial fibrillation ([place])    a. Dx 03/2016;  b. CHA2DS2VASc = 2-->Eliquis '5mg'$  BID;  b. 05/2016 Failed DCCV x 4.   Sleep apnea    Stomach irritation    Visit for monitoring Tikosyn therapy 07/24/2016   Past Surgical History:  Procedure Laterality Date   ATRIAL FIBRILLATION ABLATION N/A 12/09/2020   Procedure: ATRIAL FIBRILLATION ABLATION;  Surgeon: Vickie Epley, MD;  Location: [facility];  Service: Cardiovascular;  Laterality: N/A;   BIOPSY N/A 01/12/2020   Procedure: BIOPSY;  Surgeon: Virgel Manifold, MD;  Location: [facility];  Service: Endoscopy;  Laterality: N/A;   CARDIAC CATHETERIZATION N/A 04/17/2016   Procedure: Left Heart Cath and Coronary Angiography;  Surgeon: Wellington Hampshire, MD;  Location: [facility];  Service: Cardiovascular;  Laterality: N/A;   CARDIOVERSION N/A 10/22/2020   Procedure: CARDIOVERSION;  Surgeon: Nelva Bush, MD;  Location: [facility];  Service: Cardiovascular;  Laterality: N/A;   CHOLECYSTECTOMY     COLON SURGERY  2021   COLONOSCOPY N/A 01/20/2020   Procedure: COLONOSCOPY;  Surgeon: Lesly Rubenstein, MD;  Location: [facility];  Service: Endoscopy;  Laterality: N/A;   COLONOSCOPY WITH PROPOFOL N/A 01/12/2020   Procedure: COLONOSCOPY WITH PROPOFOL;  Surgeon: Virgel Manifold, MD;  Location: [facility];  Service: Endoscopy;  Laterality: N/A;  Diabetic - oral meds sleep apnea   ELECTROPHYSIOLOGIC STUDY N/A 05/26/2016   Procedure: Cardioversion;  Surgeon: Wellington Hampshire, MD;  Location: [facility];  Service: Cardiovascular;  Laterality: N/A;   ESOPHAGOGASTRODUODENOSCOPY (EGD) WITH PROPOFOL N/A 01/12/2020   Procedure: ESOPHAGOGASTRODUODENOSCOPY (EGD) WITH PROPOFOL;  Surgeon: Virgel Manifold, MD;  Location: [facility] [place];  Service: Endoscopy;  Laterality: N/A;   POLYPECTOMY N/A 01/12/2020   Procedure: POLYPECTOMY;  Surgeon: Virgel Manifold, MD;  Location: [facility];  Service: Endoscopy;  Laterality: N/A;   Patient Active Problem List   Diagnosis Date Noted   Pes anserinus bursitis of right knee 04/19/2022   Primary osteoarthritis of right knee 03/27/2022   Greater 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 03/27/2022  It band syndrome, right 03/27/2022   Hyperlipidemia associated with type 2 diabetes mellitus ([place]) 05/12/2021   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   Varicose veins of leg with pain, bilateral 07/06/2020   Acquired thrombophilia ([place]) 05/24/2020   Type II diabetes mellitus with complication ([place]) 82/99/3716   Gastric polyp    Colon polyps    Varicose veins of both lower extremities 10/20/2019   Dysphagia 10/20/2019   Osteoarthritis of right hand 10/31/2016   Nonischemic  cardiomyopathy ([place]) 04/14/2016   BMI 33.0-33.9,adult 04/14/2016   PCP: Dr. Halina Maidens  REFERRING PROVIDER: Dr. Rosette Reveal  REFERRING DIAG: M17.11 (ICD-10-CM) - Primary osteoarthritis of right knee, M76.31 (ICD-10-CM) - It band syndrome, right, M25.551 (ICD-10-CM) - Greater 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  RATIONALE FOR EVALUATION AND TREATMENT: Rehabilitation  THERAPY DIAG: Chronic pain of right knee  Pain in right hip  ONSET DATE: August 2023  FOLLOW-UP APPT SCHEDULED WITH REFERRING PROVIDER: Yes   FROM INITIAL EVALUATION SUBJECTIVE:                                                                                                                                                                                         SUBJECTIVE STATEMENT:  R hip and R knee pain  PERTINENT HISTORY:  Pt reports history of chronic R hip pain for decades with recent onset of R knee pain in August 2023. No known trauma but pt thinks she might have aggravated her knee walking across uneven grass. She saw Dr. Zigmund Daniel and eventually had steroid injections in her R greater trochanter, R knee injection, and R pes bursa on 04/18/22. Radiographs (03/14/22) of R knee showed mild degenerative changes at the patellofemoral joint, worse laterally. She receives chiropractic care every 2 weeks for her low back/hip pain and also sees massage therapy once/month.   PAIN:    Pain Intensity: Present: 1/10, Best: 0/10, Worst: 3/10 Pain location: Medial R knee pain Pain Quality: pressure, sharp Radiating: No  Numbness/Tingling: No Focal Weakness: Yes, R knee feels unstable and feels like it will hyperextend Aggravating factors: Extended standing, standing after extended sitting, squatting,  Relieving factors: resting, ice, elevation, heat, antiinflammatories, using cane and knee brace; 24-hour pain behavior: activity-dependent How long can you stand: 10 minutes History of prior back, hip, knee  injury, pain, surgery, or therapy: Yes, history of chronic back and R hip pain. No prior history of orthopedic surgery to back, hip, or knee. Dominant hand: right Imaging: Yes, see history Red flags: Negative for bowel/bladder changes, saddle paresthesia, personal history of cancer, h/o spinal tumors, h/o compression fx, h/o abdominal aneurysm, abdominal pain, chills/fever, night sweats,  nausea, vomiting, unrelenting pain, first onset of insidious LBP <19 y/o  PRECAUTIONS: None  WEIGHT BEARING RESTRICTIONS: No  FALLS: Has patient fallen in last 6 months? No  Living Environment Lives with: lives alone Lives in: House/apartment Stairs: Yes: External: 3 steps; can reach both Has following equipment at home: Single point cane  Prior level of function: Independent  Occupational demands: Pt is an assessment specialist at [employer] (has to walk a long way to get to the testing room).   Hobbies: Pottery (currently not limited but initially had trouble sitting at the wheel), gardening/yard work;  Patient Goals: walking across the yard with improved confidence, lifting heavier objects at home without pain in hip  OBJECTIVE:  Patient Surveys  LEFS 39/80 FOTO 52, predicted improvement to 14  Cognition Patient is oriented to person, place, and time.  Recent memory is intact.  Remote memory is intact.  Attention span and concentration are intact.  Expressive speech is intact.  Patient's fund of knowledge is within normal limits for educational level.    Gross Musculoskeletal Assessment Tremor: None Bulk: Normal Tone: Normal  GAIT: Full gait assessment deferred however gait is notably antalgic on RLE  Posture: Forward head/rounded shoulders in sitting, full posture assessment deferre;  AROM AROM (Normal range in degrees) AROM    Hip Right Left  Flexion (125) WNL WNL  Extension (15)    Abduction (40)    Adduction     Internal Rotation (45)    External Rotation (45)        Knee     Flexion (135) WNL WNL  Extension (0) WNL WNL      Ankle    Dorsiflexion (20)    Plantarflexion (50)    Inversion (35)    Eversion (15    (* = pain; Blank rows = not tested)  LE MMT: MMT (out of 5) Right  Left   Hip flexion 4 4+  Hip extension 4- 4  Hip abduction 4- 4  Hip adduction 4- 4+  Hip internal rotation 4 5  Hip external rotation 4 5  Knee flexion 4 4+  Knee extension 5 5  Ankle dorsiflexion 4+ 4+  Ankle plantarflexion    Ankle inversion    Ankle eversion    (* = pain; Blank rows = not tested)  Sensation Pt reports decreased sensation in RLE L4-L5 but otherwise intact and symmetrical. Proprioception, and hot/cold testing deferred on this date.  Reflexes Deferred  Muscle Length Hamstrings: R: Positive for tightness at 80 degrees L: Positive for tightness at 80 degrees  Quadriceps Pat Patrick): R: Not examined L: Not examined Hip flexors Marcello Moores): R: Not examined L: Not examined IT band Nicoletta Dress): R: Not examined L: Not examined  Palpation Location LEFT  RIGHT           Quadriceps  0  Medial Hamstrings  0  Lateral Hamstrings  0  Lateral Hamstring tendon  0  Medial Hamstring tendon  1  Quadriceps tendon  0  Patella  0  Patellar Tendon  0  Tibial Tuberosity  0  Medial joint line  1  Lateral joint line  0  MCL  1  LCL  0  Adductor Tubercle  1  Pes Anserine tendon  1  Infrapatellar fat pad  0  Fibular head  0  Popliteal fossa  0  (Blank rows = not tested) Graded on 0-4 scale (0 = no pain, 1 = pain, 2 = pain with wincing/grimacing/flinching, 3 = pain with  withdrawal, 4 = unwilling to allow palpation), (Blank rows = not tested)  Passive Accessory Motion Deferred PAM testing of knee  VASCULAR Deferred  SPECIAL TESTS  Ligamentous Stability  ACL: Lachman's: R: Negative L: Not examined Anterior Drawer: R: Negative L: Not examined Pivot Shift: R: Negative L: Not examined  PCL: Posterior Drawer: R: Negative L: Not examined Reverse Lachman's: R: Negative  L: Not examined Posterior Sag Sign: R: Negative L: Not examined  MCL: Valgus Stress (30 degrees flexion): R: Negative L: Not examined  LCL: Varus Stress (30 degrees flexion): R: Negative L: Not examined  Meniscus Tests McMurray's Test:  Medial Meniscus (Tibial ER): R: Negative L: Not examined Lateral Meniscus (Tibial IR): R: Negative L: Not examined Thessaly: R: Not examined L: Not examined Steinmann Sign I: R: Negative L: Not examined  Patellofemoral Pain Syndrome Patellar Tilt (Lateral): R: Negative L: Not examined Patellar Apprehension: R: Negative L: Not examined Squatting pain: R: Not examined L: Not examined Stair climbing pain: R: Negative L: Not examined Kneeling pain: R: Positive L: Not examined Resisted knee extension pain: R: Negative L: Not examined  Passive Accessory Intervertebral Motion Pt denies reproduction of hip or knee pain with CPA L1-L5 and UPA bilaterally L1-L5. Generally, hypomobile throughout with tenderness along spine.  Special Tests Lumbar Radiculopathy and Discogenic: Centralization and Peripheralization (SN 92, -LR 0.12): Not examined Slump (SN 83, -LR 0.32): R: Negative L: Negative SLR (SN 92, -LR 0.29): R: Negative L:  Negative Crossed SLR (SP 90): R: Negative L: Not examined  Facet Joint: Extension-Rotation (SN 100, -LR 0.0): R: Negative L: Negative  Lumbar Foraminal Stenosis: Lumbar quadrant (SN 70): R: Negative L: Negative  Hip: FABER (SN 81): R: Positive L: Negative FADIR (SN 94): R: Positive L: Negative Hip scour (SN 50): R: Negative L: Negative Figure 4: R: Negative L: Positive for lateral L hip pain  SIJ:  Thigh Thrust (SN 88, -LR 0.18) : R: Not examined L: Not examined  Piriformis Syndrome: FAIR Test (SN 88, SP 83): R: Not examined L: Not examined  Functional Tasks Deferred  Patellar Tendinopathy Inferior pole palpation with anterior tilt: R: Negative L: Not examined   Beighton Scale: Deferred, denies history of  hypermobility   TODAY'S TREATMENT    SUBJECTIVE: Pt reports that she is doing alright today. "I did something to my L knee this weekend." Pt states she started having knee pain while walking this weekend but doesn't recall any specific injury. Pain starts just above the patella and extends down just below knee. She rates the pain as 1/10 upon arrival. No specific questions upon arrival today.   PAIN: 1/10 L knee pain   Ther-ex  NuStep L1-4 x 6 minutes for LE strengthening and warm-up while obtaining interval history and therapist adjusting resistance to alter difficulty;  Total Gym (TG) Level 22 (L22) mini squats 2 x 10; TG L22 heel raises 2 x 10;  Standing exercises with 5# ankle weights (AW)  Hip flexion marches x 10 BLE; Hamstring curls x 10 BLE; Hip abduction x 10 BLE; Hip extension x 10 BLE;  Seated LAQ with 5# ankle weights (AW) 2 x 10 BLE; Seated clams with blue tband resistance 2 x 10; Seated ball squeeze 2 x 10;  Side stepping with 5# AW and blue tband around knees 12' x 4 lengths; Forward BOSU (round side up) step-ups x 10 with each LE; Forward BOSU (round side up) lunges x 10 with each foot forward;   Not performed: Supine quad  sets over towel roll 5s hold x 15 RLE, 10 LLE; Supine SLR x 10 BLE; Hooklying marching with green tband around knees 2 x 10 BLE; Hooklying clams with green tband 2 x 10; Hooklying adductor squeeze with manual resistance from therapist 2 x 10; Hooklying bridges with arms across chest and green tband around knees 2 x 10; Hooklying R SAQ over bolster with manual resistance 2 x 10; Supine heel slide with manual resistance from therapist 2 x 10 BLE; Sidelying straight knee hip abduction with 2# ankle weights (AW) 2 x 10 BLE; Sidelying reverse clams with 2# AW x 10 LLE, 2 x 10 RLE;    PATIENT EDUCATION:  Education details: Pt educated throughout session about proper posture and technique with exercises. Improved exercise technique, movement  at target joints, use of target muscles after min to mod verbal, visual, tactile cues. Person educated: Patient Education method: Explanation Education comprehension: verbalized understanding and returned demonstration   HOME EXERCISE PROGRAM:  Access Code: [email or web address] URL: [email or web address] Date: 06/02/2022 Prepared by: Roxana Hires  Exercises - Supine Quad Set (Mirrored)  - 1 x daily - 7 x weekly - 2 sets - 10 reps - 5s hold - Supine Bridge  - 1 x daily - 7 x weekly - 2 sets - 10 reps - 2s hold - Supine March with Resistance Band  - 1 x daily - 7 x weekly - 2 sets - 10 reps - 2s hold - Seated Hip Adduction Isometrics with Ball  - 1 x daily - 7 x weekly - 2 sets - 10 reps - 2s hold - Seated Hip Abduction with Resistance  - 1 x daily - 7 x weekly - 2 sets - 10 reps - 2s hold - Standing Hip Abduction with Resistance at Ankles and Counter Support  - 1 x daily - 7 x weekly - 2 sets - 10 reps - 2s hold - Standing Hip Extension with Resistance at Counter Support  - 1 x daily - 7 x weekly - 2 sets - 10 reps - 2 hold - Mini Squat with Counter Support  - 1 x daily - 7 x weekly - 2 sets - 10 reps   GOALS: Goals reviewed with patient? Yes  SHORT TERM GOALS: Target date: 06/08/2022  Pt will be independent with HEP to improve strength and decrease knee pain to improve pain-free function at home and work. Baseline:  Goal status: INITIAL   LONG TERM GOALS: Target date: 07/06/2022  Pt will increase FOTO to at least 61 to demonstrate significant improvement in function at home and work related to knee pain  Baseline: 05/11/22: 52 Goal status: INITIAL  2.  Pt will be able to bend and squat without an increase in her R knee pain in order to resume working in her yard/garden with less pain Baseline: 05/11/22: 3/10 R knee/hip pain Goal status: INITIAL  3.  Pt will increase LEFS score by at least 9 points in order demonstrate clinically significant reduction in knee  pain/disability.       Baseline: 05/11/22: 39/80 Goal status: INITIAL  4.  Pt will increase strength of R hip abduction to at least 4+/5 MMT grade in order to demonstrate improvement in strength and function and improved stability at right knee . Baseline: 05/11/22: 4-/5 Goal status: INITIAL   ASSESSMENT:  CLINICAL IMPRESSION: Pt demonstrates excellent motivation and effort during session today. Progressed strengthening to additional upright exercises today and pt denies any increase in R hip or knee  pain. She does continue with some L knee pain throughout session. No updates to HEP during session today. Pt encouraged to follow-up as scheduled. She will benefit from PT services to address deficits in strength and range of motion in order to return to full function at home with less pain.  OBJECTIVE IMPAIRMENTS: Abnormal gait, decreased strength, and pain.   ACTIVITY LIMITATIONS: lifting and squatting  PARTICIPATION LIMITATIONS: cleaning, shopping, community activity, occupation, and yard work  PERSONAL FACTORS: Age, Time since onset of injury/illness/exacerbation, and 3+ comorbidities: DM, obesity, OSA, and afib  are also affecting patient's functional outcome.   REHAB POTENTIAL: Fair    CLINICAL DECISION MAKING: Unstable/unpredictable  EVALUATION COMPLEXITY: High   PLAN: PT FREQUENCY: 1-2x/week  PT DURATION: 8 weeks  PLANNED INTERVENTIONS: Therapeutic exercises, Therapeutic activity, Neuromuscular re-education, Balance training, Gait training, Patient/Family education, Self Care, Joint mobilization, Joint manipulation, Vestibular training, Canalith repositioning, Orthotic/Fit training, DME instructions, Dry Needling, Electrical stimulation, Spinal manipulation, Spinal mobilization, Cryotherapy, Moist heat, Taping, Traction, Ultrasound, Ionotophoresis '4mg'$ /ml Dexamethasone, Manual therapy, and Re-evaluation.  PLAN FOR NEXT SESSION: Progress strengthening for RLE, review/modify HEP  as needed;  Lyndel Safe Kharee Lesesne PT, DPT, [facility]  Ariann Khaimov, PT 06/07/2022, 1:03 PM

## 2022-06-08 ENCOUNTER — Ambulatory Visit: Payer: Medicare Other

## 2022-06-08 DIAGNOSIS — M25561 Pain in right knee: Secondary | ICD-10-CM | POA: Diagnosis not present

## 2022-06-08 DIAGNOSIS — M25551 Pain in right hip: Secondary | ICD-10-CM

## 2022-06-08 DIAGNOSIS — G8929 Other chronic pain: Secondary | ICD-10-CM

## 2022-06-08 NOTE — Therapy (Signed)
OUTPATIENT PHYSICAL THERAPY KNEE TREATMENT  Patient Name: Brandy [name] MRN: [id number] DOB:[birth year], 67 y.o., female Today's Date: 06/08/2022  END OF SESSION:  PT End of Session - 06/08/22 1405     Visit Number 8    Number of Visits 17    Date for PT Re-Evaluation 07/06/22    Authorization Type [employer] [place]    Authorization Time Period 05/11/22-07/06/22    Progress Note Due on Visit 10    PT Start Time 1401    PT Stop Time 1440    PT Time Calculation (min) 39 min    Equipment Utilized During Treatment Gait belt    Activity Tolerance Patient tolerated treatment well;No increased pain    Behavior During Therapy [facility] for tasks assessed/performed            Past Medical History:  Diagnosis Date   Acute lower GI bleeding 01/19/2020   Arthritis    Atrial fibrillation with rapid ventricular response (HCC)    Chicken pox    Chronic systolic CHF (congestive heart failure) ([place])    a. 03/2016 Echo: Ef 15-20%, diff HK, ant AK;  b. 05/2016 Echo: Ef 30-35%, diff HK, mildly dil LA/RA.   Diabetes mellitus without complication (HCC)    Dysrhythmia    GERD (gastroesophageal reflux disease)    Heart murmur    Hip discomfort    been going on for 40 years    History of kidney stones    Hyperlipidemia associated with type 2 diabetes mellitus ([place]) 05/12/2021   Hypertension    Moderate mitral regurgitation    a. 03/2016 Echo: mod MR in setting of LV dysfxn.   Motion sickness    car - back seat   NICM (nonischemic cardiomyopathy) ([place])    a. 03/2016 Echo: EF 15-20%, diff HK, ant AK, mod MR, mod dil LA, mildly dil RA;  b. 04/2016 Cath: nl cors;  c. 05/2016 Echo: EF 30-35%, diff HK.   Obesity    Obstructive sleep apnea    compliant with CPAP   Persistent atrial fibrillation ([place])    a. Dx 03/2016;  b. CHA2DS2VASc = 2-->Eliquis '5mg'$  BID;  b. 05/2016 Failed DCCV x 4.   Sleep apnea    Stomach irritation    Visit for monitoring Tikosyn therapy 07/24/2016   Past Surgical History:   Procedure Laterality Date   ATRIAL FIBRILLATION ABLATION N/A 12/09/2020   Procedure: ATRIAL FIBRILLATION ABLATION;  Surgeon: Vickie Epley, MD;  Location: [place] [facility];  Service: Cardiovascular;  Laterality: N/A;   BIOPSY N/A 01/12/2020   Procedure: BIOPSY;  Surgeon: Virgel Manifold, MD;  Location: [place];  Service: Endoscopy;  Laterality: N/A;   CARDIAC CATHETERIZATION N/A 04/17/2016   Procedure: Left Heart Cath and Coronary Angiography;  Surgeon: Wellington Hampshire, MD;  Location: [place] [facility];  Service: Cardiovascular;  Laterality: N/A;   CARDIOVERSION N/A 10/22/2020   Procedure: CARDIOVERSION;  Surgeon: Nelva Bush, MD;  Location: [facility];  Service: Cardiovascular;  Laterality: N/A;   CHOLECYSTECTOMY     COLON SURGERY  2021   COLONOSCOPY N/A 01/20/2020   Procedure: COLONOSCOPY;  Surgeon: Lesly Rubenstein, MD;  Location: [facility];  Service: Endoscopy;  Laterality: N/A;   COLONOSCOPY WITH PROPOFOL N/A 01/12/2020   Procedure: COLONOSCOPY WITH PROPOFOL;  Surgeon: Virgel Manifold, MD;  Location: [place];  Service: Endoscopy;  Laterality: N/A;  Diabetic - oral meds sleep apnea   ELECTROPHYSIOLOGIC STUDY N/A 05/26/2016  Procedure: Cardioversion;  Surgeon: Wellington Hampshire, MD;  Location: [facility];  Service: Cardiovascular;  Laterality: N/A;   ESOPHAGOGASTRODUODENOSCOPY (EGD) WITH PROPOFOL N/A 01/12/2020   Procedure: ESOPHAGOGASTRODUODENOSCOPY (EGD) WITH PROPOFOL;  Surgeon: Virgel Manifold, MD;  Location: [place];  Service: Endoscopy;  Laterality: N/A;   POLYPECTOMY N/A 01/12/2020   Procedure: POLYPECTOMY;  Surgeon: Virgel Manifold, MD;  Location: [place];  Service: Endoscopy;  Laterality: N/A;   Patient Active Problem List   Diagnosis Date Noted   Pes anserinus bursitis of right knee 04/19/2022   Primary osteoarthritis of right knee 03/27/2022   Greater trochanteric pain syndrome of right lower  extremity 03/27/2022   It band syndrome, right 03/27/2022   Hyperlipidemia associated with type 2 diabetes mellitus ([place]) 05/12/2021   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   Varicose veins of leg with pain, bilateral 07/06/2020   Acquired thrombophilia ([place]) 05/24/2020   Type II diabetes mellitus with complication ([place]) 26/37/8588   Gastric polyp    Colon polyps    Varicose veins of both lower extremities 10/20/2019   Dysphagia 10/20/2019   Osteoarthritis of right hand 10/31/2016   Nonischemic cardiomyopathy ([place]) 04/14/2016   BMI 33.0-33.9,adult 04/14/2016   PCP: Dr. Halina Maidens  REFERRING PROVIDER: Dr. Rosette Reveal  REFERRING DIAG: M17.11 (ICD-10-CM) - Primary osteoarthritis of right knee, M76.31 (ICD-10-CM) - It band syndrome, right, M25.551 (ICD-10-CM) - Greater 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  RATIONALE FOR EVALUATION AND TREATMENT: Rehabilitation  THERAPY DIAG: Chronic pain of right knee  Pain in right hip  ONSET DATE: August 2023  FOLLOW-UP APPT SCHEDULED WITH REFERRING PROVIDER: Yes   FROM INITIAL EVALUATION SUBJECTIVE:                                                                                                                                                                                         SUBJECTIVE STATEMENT:  R hip and R knee pain  PERTINENT HISTORY:  Pt reports history of chronic R hip pain for decades with recent onset of R knee pain in August 2023. No known trauma but pt thinks she might have aggravated her knee walking across uneven grass. She saw Dr. Zigmund Daniel and eventually had steroid injections in her R greater trochanter, R knee injection, and R pes bursa on 04/18/22. Radiographs (03/14/22) of R knee showed mild degenerative changes at the patellofemoral joint, worse laterally. She receives chiropractic care every 2 weeks for her low back/hip pain and also sees massage therapy once/month.   PAIN:    0/10   PRECAUTIONS: None  WEIGHT BEARING RESTRICTIONS: No  FALLS: Has patient fallen in last  6 months? No   OBJECTIVE:  TODAY'S TREATMENT    SUBJECTIVE: No updates since prior session. Pt doing well as long as she keeps moving, avoids getting stiff at work.   PAIN: none  INTERVENTION THIS DATE 06/08/22   -NuStep L1-4 x 6 minutes for LE strengthening and warm-up while obtaining interval history and therapist adjusting resistance to alter difficulty; -Forward stepups BOSU 1x15 bilat, CL high knee swing, UE support -lateral side stepping 5lb AW bilat, 1x69f bilat    -STS from chair+ airex 1x10 (mil discomfort, no pain)   Standing exercises with 5# ankle weights (AW)  -Hip flexion marches x 10 BLE; -Hamstring curls x 10 BLE; -Hip abduction x 10 BLE; -Hip extension x 10 BLE; -Seated LAQ with 5# ankle weights (AW) 1 x 10 BLE; -Seated ball squeeze 1 x 10; -Seated clams with blue tband resistance 1 x 10; -Seated ball squeeze 1 x 10; -Seated LAQ with 5# ankle weights (AW) 1 x 10 BLE; -Hip extension x 10 BLE; -Hip abduction x 10 BLE; -Hamstring curls x 10 BLE; -Hip flexion marches x 10 BLE  PATIENT EDUCATION:  Education details: Pt educated throughout session about proper posture and technique with exercises. Improved exercise technique, movement at target joints, use of target muscles after min to mod verbal, visual, tactile cues. Person educated: Patient Education method: Explanation Education comprehension: verbalized understanding and returned demonstration   HOME EXERCISE PROGRAM:  Access Code: [email or web address] URL: [email or web address] Date: 06/02/2022 Prepared by: JRoxana Hires Exercises - Supine Quad Set (Mirrored)  - 1 x daily - 7 x weekly - 2 sets - 10 reps - 5s hold - Supine Bridge  - 1 x daily - 7 x weekly - 2 sets - 10 reps - 2s hold - Supine March with Resistance Band  - 1 x daily - 7 x weekly - 2 sets - 10 reps - 2s hold - Seated Hip Adduction  Isometrics with Ball  - 1 x daily - 7 x weekly - 2 sets - 10 reps - 2s hold - Seated Hip Abduction with Resistance  - 1 x daily - 7 x weekly - 2 sets - 10 reps - 2s hold - Standing Hip Abduction with Resistance at Ankles and Counter Support  - 1 x daily - 7 x weekly - 2 sets - 10 reps - 2s hold - Standing Hip Extension with Resistance at Counter Support  - 1 x daily - 7 x weekly - 2 sets - 10 reps - 2 hold - Mini Squat with Counter Support  - 1 x daily - 7 x weekly - 2 sets - 10 reps   GOALS: Goals reviewed with patient? Yes  SHORT TERM GOALS: Target date: 06/08/2022  Pt will be independent with HEP to improve strength and decrease knee pain to improve pain-free function at home and work. Baseline:  Goal status: INITIAL   LONG TERM GOALS: Target date: 07/06/2022  Pt will increase FOTO to at least 61 to demonstrate significant improvement in function at home and work related to knee pain  Baseline: 05/11/22: 52 Goal status: INITIAL  2.  Pt will be able to bend and squat without an increase in her R knee pain in order to resume working in her yard/garden with less pain Baseline: 05/11/22: 3/10 R knee/hip pain Goal status: INITIAL  3.  Pt will increase LEFS score by at least 9 points in order demonstrate clinically significant reduction in knee pain/disability.  Baseline: 05/11/22: 39/80 Goal status: INITIAL  4.  Pt will increase strength of R hip abduction to at least 4+/5 MMT grade in order to demonstrate improvement in strength and function and improved stability at right knee . Baseline: 05/11/22: 4-/5 Goal status: INITIAL   ASSESSMENT:  CLINICAL IMPRESSION: Pt demonstrates excellent motivation and effort during session today. Still working toward strength and load tolerance. No updates to HEP during session today. Pt encouraged to follow-up as scheduled. She will benefit from PT services to address deficits in strength and range of motion in order to return to full  function at home with less pain.  OBJECTIVE IMPAIRMENTS: Abnormal gait, decreased strength, and pain.   ACTIVITY LIMITATIONS: lifting and squatting  PARTICIPATION LIMITATIONS: cleaning, shopping, community activity, occupation, and yard work  PERSONAL FACTORS: Age, Time since onset of injury/illness/exacerbation, and 3+ comorbidities: DM, obesity, OSA, and afib  are also affecting patient's functional outcome.   REHAB POTENTIAL: Fair    CLINICAL DECISION MAKING: Unstable/unpredictable  EVALUATION COMPLEXITY: High   PLAN: PT FREQUENCY: 1-2x/week  PT DURATION: 8 weeks  PLANNED INTERVENTIONS: Therapeutic exercises, Therapeutic activity, Neuromuscular re-education, Balance training, Gait training, Patient/Family education, Self Care, Joint mobilization, Joint manipulation, Vestibular training, Canalith repositioning, Orthotic/Fit training, DME instructions, Dry Needling, Electrical stimulation, Spinal manipulation, Spinal mobilization, Cryotherapy, Moist heat, Taping, Traction, Ultrasound, Ionotophoresis '4mg'$ /ml Dexamethasone, Manual therapy, and Re-evaluation.  PLAN FOR NEXT SESSION: Progress strengthening for RLE, review/modify HEP as needed;  2:26 PM, 06/08/22 Etta Grandchild, PT, DPT Physical Therapist - [facility] in [address] (Office)       [place] C, PT 06/08/2022, 2:08 PM

## 2022-06-12 NOTE — Therapy (Incomplete)
OUTPATIENT PHYSICAL THERAPY KNEE TREATMENT  Patient Name: RODNEY WIGGER MRN: [id number] DOB:04/02/1956, 67 y.o., female Today's Date: 06/12/2022  END OF SESSION:   Past Medical History:  Diagnosis Date   Acute lower GI bleeding 01/19/2020   Arthritis    Atrial fibrillation with rapid ventricular response (HCC)    Chicken pox    Chronic systolic CHF (congestive heart failure) ([place])    a. 03/2016 Echo: Ef 15-20%, diff HK, ant AK;  b. 05/2016 Echo: Ef 30-35%, diff HK, mildly dil LA/RA.   Diabetes mellitus without complication (HCC)    Dysrhythmia    GERD (gastroesophageal reflux disease)    Heart murmur    Hip discomfort    been going on for 40 years    History of kidney stones    Hyperlipidemia associated with type 2 diabetes mellitus ([place]) 05/12/2021   Hypertension    Moderate mitral regurgitation    a. 03/2016 Echo: mod MR in setting of LV dysfxn.   Motion sickness    car - back seat   NICM (nonischemic cardiomyopathy) ([place])    a. 03/2016 Echo: EF 15-20%, diff HK, ant AK, mod MR, mod dil LA, mildly dil RA;  b. 04/2016 Cath: nl cors;  c. 05/2016 Echo: EF 30-35%, diff HK.   Obesity    Obstructive sleep apnea    compliant with CPAP   Persistent atrial fibrillation ([place])    a. Dx 03/2016;  b. CHA2DS2VASc = 2-->Eliquis '5mg'$  BID;  b. 05/2016 Failed DCCV x 4.   Sleep apnea    Stomach irritation    Visit for monitoring Tikosyn therapy 07/24/2016   Past Surgical History:  Procedure Laterality Date   ATRIAL FIBRILLATION ABLATION N/A 12/09/2020   Procedure: ATRIAL FIBRILLATION ABLATION;  Surgeon: Vickie Epley, MD;  Location: [place] [facility];  Service: Cardiovascular;  Laterality: N/A;   BIOPSY N/A 01/12/2020   Procedure: BIOPSY;  Surgeon: Virgel Manifold, MD;  Location: [place];  Service: Endoscopy;  Laterality: N/A;   CARDIAC CATHETERIZATION N/A 04/17/2016   Procedure: Left Heart Cath and Coronary Angiography;  Surgeon: Wellington Hampshire, MD;  Location: [place] [facility];  Service: Cardiovascular;  Laterality: N/A;   CARDIOVERSION N/A 10/22/2020   Procedure: CARDIOVERSION;  Surgeon: Nelva Bush, MD;  Location: [facility];  Service: Cardiovascular;  Laterality: N/A;   CHOLECYSTECTOMY     COLON SURGERY  2021   COLONOSCOPY N/A 01/20/2020   Procedure: COLONOSCOPY;  Surgeon: Lesly Rubenstein, MD;  Location: [facility];  Service: Endoscopy;  Laterality: N/A;   COLONOSCOPY WITH PROPOFOL N/A 01/12/2020   Procedure: COLONOSCOPY WITH PROPOFOL;  Surgeon: Virgel Manifold, MD;  Location: [place];  Service: Endoscopy;  Laterality: N/A;  Diabetic - oral meds sleep apnea   ELECTROPHYSIOLOGIC STUDY N/A 05/26/2016   Procedure: Cardioversion;  Surgeon: Wellington Hampshire, MD;  Location: [facility];  Service: Cardiovascular;  Laterality: N/A;   ESOPHAGOGASTRODUODENOSCOPY (EGD) WITH PROPOFOL N/A 01/12/2020   Procedure: ESOPHAGOGASTRODUODENOSCOPY (EGD) WITH PROPOFOL;  Surgeon: Virgel Manifold, MD;  Location: [place];  Service: Endoscopy;  Laterality: N/A;   POLYPECTOMY N/A 01/12/2020   Procedure: POLYPECTOMY;  Surgeon: Virgel Manifold, MD;  Location: [place];  Service: Endoscopy;  Laterality: N/A;   Patient Active Problem List   Diagnosis Date Noted   Pes anserinus bursitis of right knee 04/19/2022   Primary osteoarthritis of right knee 03/27/2022   Greater 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 03/27/2022  It band syndrome, right 03/27/2022   Hyperlipidemia associated with type 2 diabetes mellitus ([place]) 05/12/2021   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   Varicose veins of leg with pain, bilateral 07/06/2020   Acquired thrombophilia ([place]) 05/24/2020   Type II diabetes mellitus with complication ([place]) 86/57/8469   Gastric polyp    Colon polyps    Varicose veins of both lower extremities 10/20/2019   Dysphagia 10/20/2019   Osteoarthritis of right hand 10/31/2016   Nonischemic  cardiomyopathy ([place]) 04/14/2016   BMI 33.0-33.9,adult 04/14/2016   PCP: Dr. Halina Maidens  REFERRING PROVIDER: Dr. Rosette Reveal  REFERRING DIAG: M17.11 (ICD-10-CM) - Primary osteoarthritis of right knee, M76.31 (ICD-10-CM) - It band syndrome, right, M25.551 (ICD-10-CM) - Greater 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  RATIONALE FOR EVALUATION AND TREATMENT: Rehabilitation  THERAPY DIAG: Chronic pain of right knee  Pain in right hip  ONSET DATE: August 2023  FOLLOW-UP APPT SCHEDULED WITH REFERRING PROVIDER: Yes   FROM INITIAL EVALUATION SUBJECTIVE:                                                                                                                                                                                         SUBJECTIVE STATEMENT:  R hip and R knee pain  PERTINENT HISTORY:  Pt reports history of chronic R hip pain for decades with recent onset of R knee pain in August 2023. No known trauma but pt thinks she might have aggravated her knee walking across uneven grass. She saw Dr. Zigmund Daniel and eventually had steroid injections in her R greater trochanter, R knee injection, and R pes bursa on 04/18/22. Radiographs (03/14/22) of R knee showed mild degenerative changes at the patellofemoral joint, worse laterally. She receives chiropractic care every 2 weeks for her low back/hip pain and also sees massage therapy once/month.   PAIN:    0/10  PRECAUTIONS: None  WEIGHT BEARING RESTRICTIONS: No  FALLS: Has patient fallen in last 6 months? No   OBJECTIVE:  TODAY'S TREATMENT    SUBJECTIVE: No updates since prior session. Pt doing well as long as she keeps moving, avoids getting stiff at work.   PAIN: none  INTERVENTION THIS DATE 06/12/22   -NuStep L1-4 x 6 minutes for LE strengthening and warm-up while obtaining interval history and therapist adjusting resistance to alter difficulty; -Forward stepups BOSU 1x15 bilat, CL high knee swing, UE  support -lateral side stepping 5lb AW bilat, 1x68f bilat    -STS from chair+ airex 1x10 (mil discomfort, no pain)   Standing exercises with 5# ankle weights (AW)  -Hip flexion marches x 10 BLE; -Hamstring curls x 10 BLE; -Hip  abduction x 10 BLE; -Hip extension x 10 BLE; -Seated LAQ with 5# ankle weights (AW) 1 x 10 BLE; -Seated ball squeeze 1 x 10; -Seated clams with blue tband resistance 1 x 10; -Seated ball squeeze 1 x 10; -Seated LAQ with 5# ankle weights (AW) 1 x 10 BLE; -Hip extension x 10 BLE; -Hip abduction x 10 BLE; -Hamstring curls x 10 BLE; -Hip flexion marches x 10 BLE  PATIENT EDUCATION:  Education details: Pt educated throughout session about proper posture and technique with exercises. Improved exercise technique, movement at target joints, use of target muscles after min to mod verbal, visual, tactile cues. Person educated: Patient Education method: Explanation Education comprehension: verbalized understanding and returned demonstration   HOME EXERCISE PROGRAM:  Access Code: [email or web address] URL: [email or web address] Date: 06/02/2022 Prepared by: Roxana Hires  Exercises - Supine Quad Set (Mirrored)  - 1 x daily - 7 x weekly - 2 sets - 10 reps - 5s hold - Supine Bridge  - 1 x daily - 7 x weekly - 2 sets - 10 reps - 2s hold - Supine March with Resistance Band  - 1 x daily - 7 x weekly - 2 sets - 10 reps - 2s hold - Seated Hip Adduction Isometrics with Ball  - 1 x daily - 7 x weekly - 2 sets - 10 reps - 2s hold - Seated Hip Abduction with Resistance  - 1 x daily - 7 x weekly - 2 sets - 10 reps - 2s hold - Standing Hip Abduction with Resistance at Ankles and Counter Support  - 1 x daily - 7 x weekly - 2 sets - 10 reps - 2s hold - Standing Hip Extension with Resistance at Counter Support  - 1 x daily - 7 x weekly - 2 sets - 10 reps - 2 hold - Mini Squat with Counter Support  - 1 x daily - 7 x weekly - 2 sets - 10 reps   GOALS: Goals reviewed with  patient? Yes  SHORT TERM GOALS: Target date: 06/08/2022  Pt will be independent with HEP to improve strength and decrease knee pain to improve pain-free function at home and work. Baseline:  Goal status: INITIAL   LONG TERM GOALS: Target date: 07/06/2022  Pt will increase FOTO to at least 61 to demonstrate significant improvement in function at home and work related to knee pain  Baseline: 05/11/22: 52 Goal status: INITIAL  2.  Pt will be able to bend and squat without an increase in her R knee pain in order to resume working in her yard/garden with less pain Baseline: 05/11/22: 3/10 R knee/hip pain Goal status: INITIAL  3.  Pt will increase LEFS score by at least 9 points in order demonstrate clinically significant reduction in knee pain/disability.       Baseline: 05/11/22: 39/80 Goal status: INITIAL  4.  Pt will increase strength of R hip abduction to at least 4+/5 MMT grade in order to demonstrate improvement in strength and function and improved stability at right knee . Baseline: 05/11/22: 4-/5 Goal status: INITIAL   ASSESSMENT:  CLINICAL IMPRESSION: Pt demonstrates excellent motivation and effort during session today. Still working toward strength and load tolerance. No updates to HEP during session today. Pt encouraged to follow-up as scheduled. She will benefit from PT services to address deficits in strength and range of motion in order to return to full function at home with less pain.  OBJECTIVE IMPAIRMENTS: Abnormal gait, decreased strength, and pain.  ACTIVITY LIMITATIONS: lifting and squatting  PARTICIPATION LIMITATIONS: cleaning, shopping, community activity, occupation, and yard work  PERSONAL FACTORS: Age, Time since onset of injury/illness/exacerbation, and 3+ comorbidities: DM, obesity, OSA, and afib  are also affecting patient's functional outcome.   REHAB POTENTIAL: Fair    CLINICAL DECISION MAKING: Unstable/unpredictable  EVALUATION COMPLEXITY:  High   PLAN: PT FREQUENCY: 1-2x/week  PT DURATION: 8 weeks  PLANNED INTERVENTIONS: Therapeutic exercises, Therapeutic activity, Neuromuscular re-education, Balance training, Gait training, Patient/Family education, Self Care, Joint mobilization, Joint manipulation, Vestibular training, Canalith repositioning, Orthotic/Fit training, DME instructions, Dry Needling, Electrical stimulation, Spinal manipulation, Spinal mobilization, Cryotherapy, Moist heat, Taping, Traction, Ultrasound, Ionotophoresis '4mg'$ /ml Dexamethasone, Manual therapy, and Re-evaluation.  PLAN FOR NEXT SESSION: Progress strengthening for RLE, review/modify HEP as needed;   Lyndel Safe Jairen Goldfarb PT, DPT, GCS  Rosangelica Pevehouse, PT 06/12/2022, 8:44 AM

## 2022-06-13 ENCOUNTER — Ambulatory Visit: Payer: Medicare Other

## 2022-06-13 DIAGNOSIS — M25551 Pain in right hip: Secondary | ICD-10-CM

## 2022-06-13 DIAGNOSIS — G8929 Other chronic pain: Secondary | ICD-10-CM

## 2022-06-15 DIAGNOSIS — K080 Exfoliation of teeth due to systemic causes: Secondary | ICD-10-CM | POA: Diagnosis not present

## 2022-06-16 ENCOUNTER — Ambulatory Visit: Payer: Medicare Other | Attending: Family Medicine

## 2022-06-16 DIAGNOSIS — M25551 Pain in right hip: Secondary | ICD-10-CM | POA: Diagnosis not present

## 2022-06-16 DIAGNOSIS — M25561 Pain in right knee: Secondary | ICD-10-CM | POA: Diagnosis not present

## 2022-06-16 DIAGNOSIS — G8929 Other chronic pain: Secondary | ICD-10-CM | POA: Insufficient documentation

## 2022-06-16 NOTE — Therapy (Signed)
OUTPATIENT PHYSICAL THERAPY KNEE TREATMENT  Patient Name: Brandy [name] MRN: [id number] DOB:[birth year], 67 y.o., female Today's Date: 06/16/2022  END OF SESSION:  PT End of Session - 06/16/22 0937     Visit Number 9    Number of Visits 17    Date for PT Re-Evaluation 07/06/22    Authorization Type [employer] MCR    PT Start Time 0930    PT Stop Time 1015    PT Time Calculation (min) 45 min    Equipment Utilized During Treatment Gait belt    Activity Tolerance Patient tolerated treatment well;No increased pain    Behavior During Therapy [facility] for tasks assessed/performed            Past Medical History:  Diagnosis Date   Acute lower GI bleeding 01/19/2020   Arthritis    Atrial fibrillation with rapid ventricular response (HCC)    Chicken pox    Chronic systolic CHF (congestive heart failure) ([place])    a. 03/2016 Echo: Ef 15-20%, diff HK, ant AK;  b. 05/2016 Echo: Ef 30-35%, diff HK, mildly dil LA/RA.   Diabetes mellitus without complication (HCC)    Dysrhythmia    GERD (gastroesophageal reflux disease)    Heart murmur    Hip discomfort    been going on for 40 years    History of kidney stones    Hyperlipidemia associated with type 2 diabetes mellitus ([place]) 05/12/2021   Hypertension    Moderate mitral regurgitation    a. 03/2016 Echo: mod MR in setting of LV dysfxn.   Motion sickness    car - back seat   NICM (nonischemic cardiomyopathy) ([place])    a. 03/2016 Echo: EF 15-20%, diff HK, ant AK, mod MR, mod dil LA, mildly dil RA;  b. 04/2016 Cath: nl cors;  c. 05/2016 Echo: EF 30-35%, diff HK.   Obesity    Obstructive sleep apnea    compliant with CPAP   Persistent atrial fibrillation ([place])    a. Dx 03/2016;  b. CHA2DS2VASc = 2-->Eliquis '5mg'$  BID;  b. 05/2016 Failed DCCV x 4.   Sleep apnea    Stomach irritation    Visit for monitoring Tikosyn therapy 07/24/2016   Past Surgical History:  Procedure Laterality Date   ATRIAL FIBRILLATION ABLATION N/A 12/09/2020    Procedure: ATRIAL FIBRILLATION ABLATION;  Surgeon: Vickie Epley, MD;  Location: [place] [facility];  Service: Cardiovascular;  Laterality: N/A;   BIOPSY N/A 01/12/2020   Procedure: BIOPSY;  Surgeon: Virgel Manifold, MD;  Location: [place];  Service: Endoscopy;  Laterality: N/A;   CARDIAC CATHETERIZATION N/A 04/17/2016   Procedure: Left Heart Cath and Coronary Angiography;  Surgeon: Wellington Hampshire, MD;  Location: [place] [facility];  Service: Cardiovascular;  Laterality: N/A;   CARDIOVERSION N/A 10/22/2020   Procedure: CARDIOVERSION;  Surgeon: Nelva Bush, MD;  Location: [place] [facility];  Service: Cardiovascular;  Laterality: N/A;   CHOLECYSTECTOMY     COLON SURGERY  2021   COLONOSCOPY N/A 01/20/2020   Procedure: COLONOSCOPY;  Surgeon: Lesly Rubenstein, MD;  Location: [facility];  Service: Endoscopy;  Laterality: N/A;   COLONOSCOPY WITH PROPOFOL N/A 01/12/2020   Procedure: COLONOSCOPY WITH PROPOFOL;  Surgeon: Virgel Manifold, MD;  Location: [place];  Service: Endoscopy;  Laterality: N/A;  Diabetic - oral meds sleep apnea   ELECTROPHYSIOLOGIC STUDY N/A 05/26/2016   Procedure: Cardioversion;  Surgeon: Wellington Hampshire, MD;  Location: [facility];  Service: Cardiovascular;  Laterality: N/A;   ESOPHAGOGASTRODUODENOSCOPY (EGD) WITH PROPOFOL N/A 01/12/2020   Procedure: ESOPHAGOGASTRODUODENOSCOPY (EGD) WITH PROPOFOL;  Surgeon: Virgel Manifold, MD;  Location: [place];  Service: Endoscopy;  Laterality: N/A;   POLYPECTOMY N/A 01/12/2020   Procedure: POLYPECTOMY;  Surgeon: Virgel Manifold, MD;  Location: [place];  Service: Endoscopy;  Laterality: N/A;   Patient Active Problem List   Diagnosis Date Noted   Pes anserinus bursitis of right knee 04/19/2022   Primary osteoarthritis of right knee 03/27/2022   Greater 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 03/27/2022   It band syndrome, right 03/27/2022   Hyperlipidemia  associated with type 2 diabetes mellitus ([place]) 05/12/2021   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 Halina Maidens  REFERRING PROVIDER: Dr. Rosette Reveal  REFERRING DIAG: M17.11 (ICD-10-CM) - Primary osteoarthritis of right knee, M76.31 (ICD-10-CM) - It band syndrome, right, M25.551 (ICD-10-CM) - Greater 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  RATIONALE FOR EVALUATION AND TREATMENT: Rehabilitation  THERAPY DIAG: Chronic pain of right knee  Pain in right hip  ONSET DATE: August 2023  FOLLOW-UP APPT SCHEDULED WITH REFERRING PROVIDER: Yes   FROM INITIAL EVALUATION SUBJECTIVE:                                                                                                                                                                                         SUBJECTIVE STATEMENT:  R hip and R knee pain  PERTINENT HISTORY:  Pt reports history of chronic R hip pain for decades with recent onset of R knee pain in August 2023. No known trauma but pt thinks she might have aggravated her knee walking across uneven grass. She saw Dr. Zigmund Daniel and eventually had steroid injections in her R greater trochanter, R knee injection, and R pes bursa on 04/18/22. Radiographs (03/14/22) of R knee showed mild degenerative changes at the patellofemoral joint, worse laterally. She receives chiropractic care every 2 weeks for her low back/hip pain and also sees massage therapy once/month.   PAIN:    0/10   PRECAUTIONS: None  WEIGHT BEARING RESTRICTIONS: No  FALLS: Has patient  fallen in last 6 months? No   OBJECTIVE:  TODAY'S TREATMENT    SUBJECTIVE: No updates  since prior session. No resting pain upon arrival. She saw the chiropractor on Wednesday but denies any soreness afterwards. No specific questions or concern currently.   PAIN: none    Ther-ex  NuStep L1-4 x 6 minutes BLE only for LE strengthening and warm-up while obtaining interval history and therapist adjusting resistance to alter difficulty; Forward BOSU lunges x 10 with each foot forward; Lateral BOSU lunges x 10 toward each direction; Forward BOSU (round side up) step-ups x 10 with each LE; Standing heel raises x 20;  Standing exercises with 5# ankle weights (AW)  Hip flexion marches x 15 BLE; Hamstring curls x 15 BLE; Hip abduction x 15 BLE; Hip extension x 15 BLE;  Seated LAQ with 5# ankle weights (AW) 2 x 15 BLE; Seated clams with black tband resistance 2 x 15; Seated ball squeeze 2 x 15;  Side stepping with 5# AW and blue tband around knees 12' x 4 lengths; STS from chair+ airex with 8# overhead med ball lift 2 x 10;    PATIENT EDUCATION:  Education details: Pt educated throughout session about proper posture and technique with exercises. Improved exercise technique, movement at target joints, use of target muscles after min to mod verbal, visual, tactile cues. Person educated: Patient Education method: Explanation Education comprehension: verbalized understanding and returned demonstration   HOME EXERCISE PROGRAM:  Access Code: [email or web address] URL: [email or web address] Date: 06/02/2022 Prepared by: Roxana Hires  Exercises - Supine Quad Set (Mirrored)  - 1 x daily - 7 x weekly - 2 sets - 10 reps - 5s hold - Supine Bridge  - 1 x daily - 7 x weekly - 2 sets - 10 reps - 2s hold - Supine March with Resistance Band  - 1 x daily - 7 x weekly - 2 sets - 10 reps - 2s hold - Seated Hip Adduction Isometrics with Ball  - 1 x daily - 7 x weekly - 2 sets - 10 reps - 2s  hold - Seated Hip Abduction with Resistance  - 1 x daily - 7 x weekly - 2 sets - 10 reps - 2s hold - Standing Hip Abduction with Resistance at Ankles and Counter Support  - 1 x daily - 7 x weekly - 2 sets - 10 reps - 2s hold - Standing Hip Extension with Resistance at Counter Support  - 1 x daily - 7 x weekly - 2 sets - 10 reps - 2 hold - Mini Squat with Counter Support  - 1 x daily - 7 x weekly - 2 sets - 10 reps   GOALS: Goals reviewed with patient? Yes  SHORT TERM GOALS: Target date: 06/08/2022  Pt will be independent with HEP to improve strength and decrease knee pain to improve pain-free function at home and work. Baseline:  Goal status: INITIAL   LONG TERM GOALS: Target date: 07/06/2022  Pt will increase FOTO to at least 61 to demonstrate significant improvement in function at home and work related to knee pain  Baseline: 05/11/22: 52 Goal status: INITIAL  2.  Pt will be able to bend and squat without an increase in her R knee pain in order to resume working in her yard/garden with less pain Baseline: 05/11/22: 3/10 R knee/hip pain Goal status: INITIAL  3.  Pt will increase LEFS score by at least 9 points in order demonstrate clinically significant reduction in knee pain/disability.       Baseline: 05/11/22: 39/80 Goal status: INITIAL  4.  Pt will increase strength  of R hip abduction to at least 4+/5 MMT grade in order to demonstrate improvement in strength and function and improved stability at right knee . Baseline: 05/11/22: 4-/5 Goal status: INITIAL   ASSESSMENT:  CLINICAL IMPRESSION: Pt demonstrates excellent motivation and effort during session today. Progressed strengthening during session today and she denies any increase in pain. No updates to HEP during session today but will consider at future session. Pt encouraged to continue current program and follow-up as scheduled. She will need update outcome measures, goals, and progress note at next session. She will  benefit from PT services to address deficits in strength and range of motion in order to return to full function at home with less pain.  OBJECTIVE IMPAIRMENTS: Abnormal gait, decreased strength, and pain.   ACTIVITY LIMITATIONS: lifting and squatting  PARTICIPATION LIMITATIONS: cleaning, shopping, community activity, occupation, and yard work  PERSONAL FACTORS: Age, Time since onset of injury/illness/exacerbation, and 3+ comorbidities: DM, obesity, OSA, and afib  are also affecting patient's functional outcome.   REHAB POTENTIAL: Fair    CLINICAL DECISION MAKING: Unstable/unpredictable  EVALUATION COMPLEXITY: High   PLAN: PT FREQUENCY: 1-2x/week  PT DURATION: 8 weeks  PLANNED INTERVENTIONS: Therapeutic exercises, Therapeutic activity, Neuromuscular re-education, Balance training, Gait training, Patient/Family education, Self Care, Joint mobilization, Joint manipulation, Vestibular training, Canalith repositioning, Orthotic/Fit training, DME instructions, Dry Needling, Electrical stimulation, Spinal manipulation, Spinal mobilization, Cryotherapy, Moist heat, Taping, Traction, Ultrasound, Ionotophoresis '4mg'$ /ml Dexamethasone, Manual therapy, and Re-evaluation.  PLAN FOR NEXT SESSION: Update outcome measures/goals, progress note, progress strengthening for RLE, review/modify HEP as needed;   Lyndel Safe Brentin Shin PT, DPT, GCS  Brandy [name], PT 06/16/2022, 10:19 AM

## 2022-06-20 ENCOUNTER — Ambulatory Visit: Payer: Medicare Other

## 2022-06-20 DIAGNOSIS — M25561 Pain in right knee: Secondary | ICD-10-CM | POA: Diagnosis not present

## 2022-06-20 DIAGNOSIS — G8929 Other chronic pain: Secondary | ICD-10-CM | POA: Diagnosis not present

## 2022-06-20 DIAGNOSIS — M25551 Pain in right hip: Secondary | ICD-10-CM | POA: Diagnosis not present

## 2022-06-20 NOTE — Therapy (Signed)
OUTPATIENT PHYSICAL THERAPY KNEE TREATMENT/PROGRESS NOTE  Dates of reporting period  05/11/22   to   06/20/22   Patient Name: Brandy [name] MRN: [id number] DOB:26-Apr-1956, 67 y.o., female Today's Date: 06/21/2022  END OF SESSION:  PT End of Session - 06/20/22 1531     Visit Number 10    Number of Visits 17    Date for PT Re-Evaluation 07/06/22    Authorization Type [employer] [facility]    Authorization Time Period eval: 05/11/22    PT Start Time 1532    PT Stop Time 1615    PT Time Calculation (min) 43 min    Equipment Utilized During Treatment Gait belt    Activity Tolerance Patient tolerated treatment well;No increased pain    Behavior During Therapy [facility] for tasks assessed/performed            Past Medical History:  Diagnosis Date   Acute lower GI bleeding 01/19/2020   Arthritis    Atrial fibrillation with rapid ventricular response (HCC)    Chicken pox    Chronic systolic CHF (congestive heart failure) ([place])    a. 03/2016 Echo: Ef 15-20%, diff HK, ant AK;  b. 05/2016 Echo: Ef 30-35%, diff HK, mildly dil LA/RA.   Diabetes mellitus without complication (HCC)    Dysrhythmia    GERD (gastroesophageal reflux disease)    Heart murmur    Hip discomfort    been going on for 40 years    History of kidney stones    Hyperlipidemia associated with type 2 diabetes mellitus ([place]) 05/12/2021   Hypertension    Moderate mitral regurgitation    a. 03/2016 Echo: mod MR in setting of LV dysfxn.   Motion sickness    car - back seat   NICM (nonischemic cardiomyopathy) ([place])    a. 03/2016 Echo: EF 15-20%, diff HK, ant AK, mod MR, mod dil LA, mildly dil RA;  b. 04/2016 Cath: nl cors;  c. 05/2016 Echo: EF 30-35%, diff HK.   Obesity    Obstructive sleep apnea    compliant with CPAP   Persistent atrial fibrillation ([place])    a. Dx 03/2016;  b. CHA2DS2VASc = 2-->Eliquis '5mg'$  BID;  b. 05/2016 Failed DCCV x 4.   Sleep apnea    Stomach irritation    Visit for monitoring Tikosyn therapy  07/24/2016   Past Surgical History:  Procedure Laterality Date   ATRIAL FIBRILLATION ABLATION N/A 12/09/2020   Procedure: ATRIAL FIBRILLATION ABLATION;  Surgeon: Vickie Epley, MD;  Location: [place] [facility];  Service: Cardiovascular;  Laterality: N/A;   BIOPSY N/A 01/12/2020   Procedure: BIOPSY;  Surgeon: Virgel Manifold, MD;  Location: [place];  Service: Endoscopy;  Laterality: N/A;   CARDIAC CATHETERIZATION N/A 04/17/2016   Procedure: Left Heart Cath and Coronary Angiography;  Surgeon: Wellington Hampshire, MD;  Location: [place] [facility];  Service: Cardiovascular;  Laterality: N/A;   CARDIOVERSION N/A 10/22/2020   Procedure: CARDIOVERSION;  Surgeon: Nelva Bush, MD;  Location: [place];  Service: Cardiovascular;  Laterality: N/A;   CHOLECYSTECTOMY     COLON SURGERY  2021   COLONOSCOPY N/A 01/20/2020   Procedure: COLONOSCOPY;  Surgeon: Lesly Rubenstein, MD;  Location: [facility];  Service: Endoscopy;  Laterality: N/A;   COLONOSCOPY WITH PROPOFOL N/A 01/12/2020   Procedure: COLONOSCOPY WITH PROPOFOL;  Surgeon: Virgel Manifold, MD;  Location: [place];  Service: Endoscopy;  Laterality: N/A;  Diabetic - oral meds sleep apnea  ELECTROPHYSIOLOGIC STUDY N/A 05/26/2016   Procedure: Cardioversion;  Surgeon: Wellington Hampshire, MD;  Location: [facility];  Service: Cardiovascular;  Laterality: N/A;   ESOPHAGOGASTRODUODENOSCOPY (EGD) WITH PROPOFOL N/A 01/12/2020   Procedure: ESOPHAGOGASTRODUODENOSCOPY (EGD) WITH PROPOFOL;  Surgeon: Virgel Manifold, MD;  Location: [place];  Service: Endoscopy;  Laterality: N/A;   POLYPECTOMY N/A 01/12/2020   Procedure: POLYPECTOMY;  Surgeon: Virgel Manifold, MD;  Location: [place];  Service: Endoscopy;  Laterality: N/A;   Patient Active Problem List   Diagnosis Date Noted   Pes anserinus bursitis of right knee 04/19/2022   Primary osteoarthritis of right knee 03/27/2022   Greater  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 03/27/2022   It band syndrome, right 03/27/2022   Hyperlipidemia associated with type 2 diabetes mellitus ([place]) 05/12/2021   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   Varicose veins of leg with pain, bilateral 07/06/2020   Acquired thrombophilia ([place]) 05/24/2020   Type II diabetes mellitus with complication ([place]) 93/90/3009   Gastric polyp    Colon polyps    Varicose veins of both lower extremities 10/20/2019   Dysphagia 10/20/2019   Osteoarthritis of right hand 10/31/2016   Nonischemic cardiomyopathy ([place]) 04/14/2016   BMI 33.0-33.9,adult 04/14/2016   PCP: Dr. Halina Maidens  REFERRING PROVIDER: Dr. Rosette Reveal  REFERRING DIAG: M17.11 (ICD-10-CM) - Primary osteoarthritis of right knee, M76.31 (ICD-10-CM) - It band syndrome, right, M25.551 (ICD-10-CM) - Greater 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  RATIONALE FOR EVALUATION AND TREATMENT: Rehabilitation  THERAPY DIAG: Chronic pain of right knee  Pain in right hip  ONSET DATE: August 2023  FOLLOW-UP APPT SCHEDULED WITH REFERRING PROVIDER: Yes   FROM INITIAL EVALUATION SUBJECTIVE:                                                                                                                                                                                         SUBJECTIVE STATEMENT:  R hip and R knee pain  PERTINENT HISTORY:  Pt reports history of chronic R hip pain for decades with recent onset of R knee pain in August 2023. No known trauma but pt thinks she might have aggravated her knee walking across uneven grass. She saw Dr. Zigmund Daniel and eventually had steroid injections in her R greater trochanter, R knee injection, and R pes bursa on 04/18/22. Radiographs (03/14/22) of R knee showed mild degenerative changes at the patellofemoral joint, worse laterally. She receives chiropractic care every 2 weeks for her low back/hip pain and also sees massage  therapy once/month.   PAIN:    Pain Intensity: Present: 1/10, Best: 0/10, Worst: 3/10 Pain location:  Medial R knee pain Pain Quality: pressure, sharp Radiating: No  Numbness/Tingling: No Focal Weakness: Yes, R knee feels unstable and feels like it will hyperextend Aggravating factors: Extended standing, standing after extended sitting, squatting,  Relieving factors: resting, ice, elevation, heat, antiinflammatories, using cane and knee brace; 24-hour pain behavior: activity-dependent How long can you stand: 10 minutes History of prior back, hip, knee injury, pain, surgery, or therapy: Yes, history of chronic back and R hip pain. No prior history of orthopedic surgery to back, hip, or knee. Dominant hand: right Imaging: Yes, see history Red flags: Negative for bowel/bladder changes, saddle paresthesia, personal history of cancer, h/o spinal tumors, h/o compression fx, h/o abdominal aneurysm, abdominal pain, chills/fever, night sweats, nausea, vomiting, unrelenting pain, first onset of insidious LBP <20 y/o  PRECAUTIONS: None  WEIGHT BEARING RESTRICTIONS: No  FALLS: Has patient fallen in last 6 months? No  Living Environment Lives with: lives alone Lives in: House/apartment Stairs: Yes: External: 3 steps; can reach both Has following equipment at home: Single point cane  Prior level of function: Independent  Occupational demands: Pt is an assessment specialist at [employer] (has to walk a long way to get to the testing room).   Hobbies: Pottery (currently not limited but initially had trouble sitting at the wheel), gardening/yard work;  Patient Goals: walking across the yard with improved confidence, lifting heavier objects at home without pain in hip  OBJECTIVE:  Patient Surveys  LEFS 39/80 FOTO 52, predicted improvement to 80  Cognition Patient is oriented to person, place, and time.  Recent memory is intact.  Remote memory is intact.  Attention span and concentration are  intact.  Expressive speech is intact.  Patient's fund of knowledge is within normal limits for educational level.    Gross Musculoskeletal Assessment Tremor: None Bulk: Normal Tone: Normal  GAIT: Full gait assessment deferred however gait is notably antalgic on RLE  Posture: Forward head/rounded shoulders in sitting, full posture assessment deferre;  AROM AROM (Normal range in degrees) AROM    Hip Right Left  Flexion (125) WNL WNL  Extension (15)    Abduction (40)    Adduction     Internal Rotation (45)    External Rotation (45)        Knee    Flexion (135) WNL WNL  Extension (0) WNL WNL      Ankle    Dorsiflexion (20)    Plantarflexion (50)    Inversion (35)    Eversion (15    (* = pain; Blank rows = not tested)  LE MMT: MMT (out of 5) Right  Left   Hip flexion 4 4+  Hip extension 4- 4  Hip abduction 4- 4  Hip adduction 4- 4+  Hip internal rotation 4 5  Hip external rotation 4 5  Knee flexion 4 4+  Knee extension 5 5  Ankle dorsiflexion 4+ 4+  Ankle plantarflexion    Ankle inversion    Ankle eversion    (* = pain; Blank rows = not tested)  Sensation Pt reports decreased sensation in RLE L4-L5 but otherwise intact and symmetrical. Proprioception, and hot/cold testing deferred on this date.  Muscle Length Hamstrings: R: Positive for tightness at 80 degrees L: Positive for tightness at 80 degrees  Quadriceps Pat Patrick): R: Not examined L: Not examined Hip flexors Marcello Moores): R: Not examined L: Not examined IT band Nicoletta Dress): R: Not examined L: Not examined  Palpation Location LEFT  RIGHT  Quadriceps  0  Medial Hamstrings  0  Lateral Hamstrings  0  Lateral Hamstring tendon  0  Medial Hamstring tendon  1  Quadriceps tendon  0  Patella  0  Patellar Tendon  0  Tibial Tuberosity  0  Medial joint line  1  Lateral joint line  0  MCL  1  LCL  0  Adductor Tubercle  1  Pes Anserine tendon  1  Infrapatellar fat pad  0  Fibular head  0  Popliteal  fossa  0  (Blank rows = not tested) Graded on 0-4 scale (0 = no pain, 1 = pain, 2 = pain with wincing/grimacing/flinching, 3 = pain with withdrawal, 4 = unwilling to allow palpation), (Blank rows = not tested)  SPECIAL TESTS  Ligamentous Stability  ACL: Lachman's: R: Negative L: Not examined Anterior Drawer: R: Negative L: Not examined Pivot Shift: R: Negative L: Not examined  PCL: Posterior Drawer: R: Negative L: Not examined Reverse Lachman's: R: Negative L: Not examined Posterior Sag Sign: R: Negative L: Not examined  MCL: Valgus Stress (30 degrees flexion): R: Negative L: Not examined  LCL: Varus Stress (30 degrees flexion): R: Negative L: Not examined  Meniscus Tests McMurray's Test:  Medial Meniscus (Tibial ER): R: Negative L: Not examined Lateral Meniscus (Tibial IR): R: Negative L: Not examined Thessaly: R: Not examined L: Not examined Steinmann Sign I: R: Negative L: Not examined  Patellofemoral Pain Syndrome Patellar Tilt (Lateral): R: Negative L: Not examined Patellar Apprehension: R: Negative L: Not examined Squatting pain: R: Not examined L: Not examined Stair climbing pain: R: Negative L: Not examined Kneeling pain: R: Positive L: Not examined Resisted knee extension pain: R: Negative L: Not examined  Passive Accessory Intervertebral Motion Pt denies reproduction of hip or knee pain with CPA L1-L5 and UPA bilaterally L1-L5. Generally, hypomobile throughout with tenderness along spine.  Special Tests Lumbar Radiculopathy and Discogenic: Centralization and Peripheralization (SN 92, -LR 0.12): Not examined Slump (SN 83, -LR 0.32): R: Negative L: Negative SLR (SN 92, -LR 0.29): R: Negative L:  Negative Crossed SLR (SP 90): R: Negative L: Not examined  Facet Joint: Extension-Rotation (SN 100, -LR 0.0): R: Negative L: Negative  Lumbar Foraminal Stenosis: Lumbar quadrant (SN 70): R: Negative L: Negative  Hip: FABER (SN 81): R: Positive L: Negative FADIR  (SN 94): R: Positive L: Negative Hip scour (SN 50): R: Negative L: Negative Figure 4: R: Negative L: Positive for lateral L hip pain  SIJ:  Thigh Thrust (SN 88, -LR 0.18) : R: Not examined L: Not examined  Piriformis Syndrome: FAIR Test (SN 88, SP 83): R: Not examined L: Not examined  Patellar Tendinopathy Inferior pole palpation with anterior tilt: R: Negative L: Not examined     TODAY'S TREATMENT    SUBJECTIVE: No updates since prior session. No resting pain upon arrival. No specific questions or concern currently.   PAIN: none    Ther-ex  NuStep L1-4 x 6 minutes BLE only for LE strengthening and warm-up while obtaining interval history and therapist adjusting resistance to alter difficulty;  Updated outcome measures: LEFS: 39/80 FOTO: 55 R hip abduction strength: 4/5 (15# on dynamometer); Worst pain with squatting: 3/10;  Supine R SLR with 3# ankle weight (AW) RLE 2 x 15, LLE 2 x 13; Supine heel slide with manually resisted extension using bodyweight 2 x 10 BLE; Hooklying bridges 2 x 10; Hooklying clams 2 x 10; Hooklying adductor squeezes 2 x 10;  Sidelying hip  abduction with 3# AW 2 x 20 RLE, 2 x 10 LLE; Sidelying hip reverse clam with 3# AW 2 x 15 RLE, 2 x 10 LLE;   Not performed: Seated LAQ with 5# ankle weights (AW) 2 x 15 BLE; Seated clams with black tband resistance 2 x 15; Seated ball squeeze 2 x 15; Side stepping with 5# AW and blue tband around knees 12' x 4 lengths; STS from chair+ airex with 8# overhead med ball lift 2 x 10;  Forward BOSU lunges x 10 with each foot forward; Lateral BOSU lunges x 10 toward each direction; Forward BOSU (round side up) step-ups x 10 with each LE; Standing heel raises x 20; Standing exercises with 5# ankle weights (AW)  Hip flexion marches x 15 BLE; Hamstring curls x 15 BLE; Hip abduction x 15 BLE; Hip extension x 15 BLE;   PATIENT EDUCATION:  Education details: Pt educated throughout session about proper posture  and technique with exercises. Improved exercise technique, movement at target joints, use of target muscles after min to mod verbal, visual, tactile cues. Person educated: Patient Education method: Explanation Education comprehension: verbalized understanding and returned demonstration   HOME EXERCISE PROGRAM:  Access Code: [email or web address] URL: [email or web address] Date: 06/02/2022 Prepared by: Roxana Hires  Exercises - Supine Quad Set (Mirrored)  - 1 x daily - 7 x weekly - 2 sets - 10 reps - 5s hold - Supine Bridge  - 1 x daily - 7 x weekly - 2 sets - 10 reps - 2s hold - Supine March with Resistance Band  - 1 x daily - 7 x weekly - 2 sets - 10 reps - 2s hold - Seated Hip Adduction Isometrics with Ball  - 1 x daily - 7 x weekly - 2 sets - 10 reps - 2s hold - Seated Hip Abduction with Resistance  - 1 x daily - 7 x weekly - 2 sets - 10 reps - 2s hold - Standing Hip Abduction with Resistance at Ankles and Counter Support  - 1 x daily - 7 x weekly - 2 sets - 10 reps - 2s hold - Standing Hip Extension with Resistance at Counter Support  - 1 x daily - 7 x weekly - 2 sets - 10 reps - 2 hold - Mini Squat with Counter Support  - 1 x daily - 7 x weekly - 2 sets - 10 reps   GOALS: Goals reviewed with patient? Yes  SHORT TERM GOALS: Target date: 06/08/2022  Pt will be independent with HEP to improve strength and decrease knee pain to improve pain-free function at home and work. Baseline:  Goal status: INITIAL   LONG TERM GOALS: Target date: 07/06/2022  Pt will increase FOTO to at least 61 to demonstrate significant improvement in function at home and work related to knee pain  Baseline: 05/11/22: 52; 06/20/22: 55; Goal status: PARTIALLY MET  2.  Pt will be able to bend and squat without an increase in her R knee pain in order to resume working in her yard/garden with less pain Baseline: 05/11/22: 3/10 R knee/hip pain; 06/20/22: 3/10 R knee/hip pain; Goal status: ONGOING  3.  Pt will  increase LEFS score by at least 9 points in order demonstrate clinically significant reduction in knee pain/disability.       Baseline: 05/11/22: 39/80; 06/20/22: 39/80; Goal status: ONGOING  4.  Pt will increase strength of R hip abduction to at least 4+/5 MMT grade in order to demonstrate improvement in strength and  function and improved stability at right knee . Baseline: 05/11/22: 4-/5; 06/20/22: 4/5 (15# on dynamometer); Goal status: INITIAL   ASSESSMENT:  CLINICAL IMPRESSION: Updated outcome measures during visit today. Her R knee worst pain with squatting and her LEFS remain unchanged from the initial evaluation. Slight improvement in her FOTO score as well as her R hip abduction strength (improved from 4/5 to 4+/5).  Pt demonstrates excellent motivation and effort during session today. Progressed strengthening during session today and she denies any increase in pain. No updates to HEP during session today but will consider at future session. Pt encouraged to continue current program and follow-up as scheduled. She will need update outcome measures, goals, and progress note at next session. She will benefit from PT services to address deficits in strength and range of motion in order to return to full function at home with less pain.  OBJECTIVE IMPAIRMENTS: Abnormal gait, decreased strength, and pain.   ACTIVITY LIMITATIONS: lifting and squatting  PARTICIPATION LIMITATIONS: cleaning, shopping, community activity, occupation, and yard work  PERSONAL FACTORS: Age, Time since onset of injury/illness/exacerbation, and 3+ comorbidities: DM, obesity, OSA, and afib  are also affecting patient's functional outcome.   REHAB POTENTIAL: Fair    CLINICAL DECISION MAKING: Unstable/unpredictable  EVALUATION COMPLEXITY: High   PLAN: PT FREQUENCY: 1-2x/week  PT DURATION: 8 weeks  PLANNED INTERVENTIONS: Therapeutic exercises, Therapeutic activity, Neuromuscular re-education, Balance training,  Gait training, Patient/Family education, Self Care, Joint mobilization, Joint manipulation, Vestibular training, Canalith repositioning, Orthotic/Fit training, DME instructions, Dry Needling, Electrical stimulation, Spinal manipulation, Spinal mobilization, Cryotherapy, Moist heat, Taping, Traction, Ultrasound, Ionotophoresis '4mg'$ /ml Dexamethasone, Manual therapy, and Re-evaluation.  PLAN FOR NEXT SESSION:  progress strengthening for RLE, review/modify HEP as needed;   Lyndel Safe Rosemae Mcquown PT, DPT, GCS  River Ambrosio, PT 06/21/2022, 1:11 PM

## 2022-06-22 ENCOUNTER — Encounter (HOSPITAL_COMMUNITY): Payer: Self-pay | Admitting: *Deleted

## 2022-06-22 NOTE — Therapy (Signed)
OUTPATIENT PHYSICAL THERAPY KNEE TREATMENT  Patient Name: Brandy [name] MRN: [id number] DOB:June 28, 1955, 67 y.o., female Today's Date: 06/23/2022  END OF SESSION:   PT End of Session - 06/23/22 0939     Visit Number 11    Number of Visits 17    Date for PT Re-Evaluation 07/06/22    Authorization Type [employer] [facility] Shield [facility]    Authorization Time Period eval: 05/11/22    PT Start Time 0930    PT Stop Time 1015    PT Time Calculation (min) 45 min    Equipment Utilized During Treatment Gait belt    Activity Tolerance Patient tolerated treatment well;No increased pain    Behavior During Therapy [facility] for tasks assessed/performed             Past Medical History:  Diagnosis Date   Acute lower GI bleeding 01/19/2020   Arthritis    Atrial fibrillation with rapid ventricular response (HCC)    Chicken pox    Chronic systolic CHF (congestive heart failure) ([place])    a. 03/2016 Echo: Ef 15-20%, diff HK, ant AK;  b. 05/2016 Echo: Ef 30-35%, diff HK, mildly dil LA/RA.   Diabetes mellitus without complication (HCC)    Dysrhythmia    GERD (gastroesophageal reflux disease)    Heart murmur    Hip discomfort    been going on for 40 years    History of kidney stones    Hyperlipidemia associated with type 2 diabetes mellitus ([place]) 05/12/2021   Hypertension    Moderate mitral regurgitation    a. 03/2016 Echo: mod MR in setting of LV dysfxn.   Motion sickness    car - back seat   NICM (nonischemic cardiomyopathy) ([place])    a. 03/2016 Echo: EF 15-20%, diff HK, ant AK, mod MR, mod dil LA, mildly dil RA;  b. 04/2016 Cath: nl cors;  c. 05/2016 Echo: EF 30-35%, diff HK.   Obesity    Obstructive sleep apnea    compliant with CPAP   Persistent atrial fibrillation ([place])    a. Dx 03/2016;  b. CHA2DS2VASc = 2-->Eliquis 13m BID;  b. 05/2016 Failed DCCV x 4.   Sleep apnea    Stomach irritation    Visit for monitoring Tikosyn therapy 07/24/2016   Past Surgical History:  Procedure Laterality Date    ATRIAL FIBRILLATION ABLATION N/A 12/09/2020   Procedure: ATRIAL FIBRILLATION ABLATION;  Surgeon: LVickie Epley MD;  [facility];  Service: Cardiovascular;  Laterality: N/A;   BIOPSY N/A 01/12/2020   Procedure: BIOPSY;  Surgeon: TVirgel Manifold MD;  [place] [place]  Service: Endoscopy;  Laterality: N/A;   CARDIAC CATHETERIZATION N/A 04/17/2016   Procedure: Left Heart Cath and Coronary Angiography;  Surgeon: MWellington Hampshire MD;  [facility];  Service: Cardiovascular;  Laterality: N/A;   CARDIOVERSION N/A 10/22/2020   Procedure: CARDIOVERSION;  Surgeon: ENelva Bush MD;  [facility];  Service: Cardiovascular;  Laterality: N/A;   CHOLECYSTECTOMY     COLON SURGERY  2021   COLONOSCOPY N/A 01/20/2020   Procedure: COLONOSCOPY;  Surgeon: LLesly Rubenstein MD;  Location: [facility];  Service: Endoscopy;  Laterality: N/A;   COLONOSCOPY WITH PROPOFOL N/A 01/12/2020   Procedure: COLONOSCOPY WITH PROPOFOL;  Surgeon: TVirgel Manifold MD;  [place] [facility]  Service: Endoscopy;  Laterality: N/A;  Diabetic - oral meds sleep apnea   ELECTROPHYSIOLOGIC STUDY N/A 05/26/2016   Procedure: Cardioversion;  Surgeon: MRogue Jury  Ferne Reus, MD;  Location: [facility];  Service: Cardiovascular;  Laterality: N/A;   ESOPHAGOGASTRODUODENOSCOPY (EGD) WITH PROPOFOL N/A 01/12/2020   Procedure: ESOPHAGOGASTRODUODENOSCOPY (EGD) WITH PROPOFOL;  Surgeon: Virgel Manifold, MD;  Location: [place];  Service: Endoscopy;  Laterality: N/A;   POLYPECTOMY N/A 01/12/2020   Procedure: POLYPECTOMY;  Surgeon: Virgel Manifold, MD;  Location: [place];  Service: Endoscopy;  Laterality: N/A;   Patient Active Problem List   Diagnosis Date Noted   Pes anserinus bursitis of right knee 04/19/2022   Primary osteoarthritis of right knee 03/27/2022   Greater 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 03/27/2022   It band  syndrome, right 03/27/2022   Hyperlipidemia associated with type 2 diabetes mellitus ([place]) 05/12/2021   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   Varicose veins of leg with pain, bilateral 07/06/2020   Acquired thrombophilia ([place]) 05/24/2020   Type II diabetes mellitus with complication ([place]) 123456   Gastric polyp    Colon polyps    Varicose veins of both lower extremities 10/20/2019   Dysphagia 10/20/2019   Osteoarthritis of right hand 10/31/2016   Nonischemic cardiomyopathy ([place]) 04/14/2016   BMI 33.0-33.9,adult 04/14/2016   PCP: Dr. Halina Maidens  REFERRING PROVIDER: Dr. Rosette Reveal  REFERRING DIAG: M17.11 (ICD-10-CM) - Primary osteoarthritis of right knee, M76.31 (ICD-10-CM) - It band syndrome, right, M25.551 (ICD-10-CM) - Greater 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  RATIONALE FOR EVALUATION AND TREATMENT: Rehabilitation  THERAPY DIAG: Chronic pain of right knee  Pain in right hip  ONSET DATE: August 2023  FOLLOW-UP APPT SCHEDULED WITH REFERRING PROVIDER: Yes   FROM INITIAL EVALUATION SUBJECTIVE:                                                                                                                                                                                         SUBJECTIVE STATEMENT:  R hip and R knee pain  PERTINENT HISTORY:  Pt reports history of chronic R hip pain for decades with recent onset of R knee pain in August 2023. No known trauma but pt thinks she might have aggravated her knee walking across uneven grass. She saw Dr. Zigmund Daniel and eventually had steroid injections in her R greater trochanter, R knee injection, and R pes bursa on 04/18/22. Radiographs (03/14/22) of R knee showed mild degenerative changes at the patellofemoral joint, worse laterally. She receives chiropractic care every 2 weeks for her low back/hip pain and also sees massage therapy once/month.   PAIN:    Pain Intensity: Present: 1/10, Best: 0/10,  Worst: 3/10 Pain location: Medial R knee pain Pain Quality: pressure, sharp Radiating: No  Numbness/Tingling: No Focal Weakness: Yes, R knee feels unstable and feels like it will hyperextend Aggravating factors: Extended standing, standing after extended sitting, squatting,  Relieving factors: resting, ice, elevation, heat, antiinflammatories, using cane and knee brace; 24-hour pain behavior: activity-dependent How long can you stand: 10 minutes History of prior back, hip, knee injury, pain, surgery, or therapy: Yes, history of chronic back and R hip pain. No prior history of orthopedic surgery to back, hip, or knee. Dominant hand: right Imaging: Yes, see history Red flags: Negative for bowel/bladder changes, saddle paresthesia, personal history of cancer, h/o spinal tumors, h/o compression fx, h/o abdominal aneurysm, abdominal pain, chills/fever, night sweats, nausea, vomiting, unrelenting pain, first onset of insidious LBP <19 y/o  PRECAUTIONS: None  WEIGHT BEARING RESTRICTIONS: No  FALLS: Has patient fallen in last 6 months? No  Living Environment Lives with: lives alone Lives in: House/apartment Stairs: Yes: External: 3 steps; can reach both Has following equipment at home: Single point cane  Prior level of function: Independent  Occupational demands: Pt is an assessment specialist at [employer] (has to walk a long way to get to the testing room).   Hobbies: Pottery (currently not limited but initially had trouble sitting at the wheel), gardening/yard work;  Patient Goals: walking across the yard with improved confidence, lifting heavier objects at home without pain in hip  OBJECTIVE:  Patient Surveys  LEFS 39/80 FOTO 52, predicted improvement to 44  Cognition Patient is oriented to person, place, and time.  Recent memory is intact.  Remote memory is intact.  Attention span and concentration are intact.  Expressive speech is intact.  Patient's fund of knowledge is within  normal limits for educational level.    Gross Musculoskeletal Assessment Tremor: None Bulk: Normal Tone: Normal  GAIT: Full gait assessment deferred however gait is notably antalgic on RLE  Posture: Forward head/rounded shoulders in sitting, full posture assessment deferre;  AROM AROM (Normal range in degrees) AROM    Hip Right Left  Flexion (125) WNL WNL  Extension (15)    Abduction (40)    Adduction     Internal Rotation (45)    External Rotation (45)        Knee    Flexion (135) WNL WNL  Extension (0) WNL WNL      Ankle    Dorsiflexion (20)    Plantarflexion (50)    Inversion (35)    Eversion (15    (* = pain; Blank rows = not tested)  LE MMT: MMT (out of 5) Right  Left   Hip flexion 4 4+  Hip extension 4- 4  Hip abduction 4- 4  Hip adduction 4- 4+  Hip internal rotation 4 5  Hip external rotation 4 5  Knee flexion 4 4+  Knee extension 5 5  Ankle dorsiflexion 4+ 4+  Ankle plantarflexion    Ankle inversion    Ankle eversion    (* = pain; Blank rows = not tested)  Sensation Pt reports decreased sensation in RLE L4-L5 but otherwise intact and symmetrical. Proprioception, and hot/cold testing deferred on this date.  Muscle Length Hamstrings: R: Positive for tightness at 80 degrees L: Positive for tightness at 80 degrees  Quadriceps Pat Patrick): R: Not examined L: Not examined Hip flexors Marcello Moores): R: Not examined L: Not examined IT band Nicoletta Dress): R: Not examined L: Not examined  Palpation Location LEFT  RIGHT           Quadriceps  0  Medial Hamstrings  0  Lateral Hamstrings  0  Lateral Hamstring tendon  0  Medial Hamstring tendon  1  Quadriceps tendon  0  Patella  0  Patellar Tendon  0  Tibial Tuberosity  0  Medial joint line  1  Lateral joint line  0  MCL  1  LCL  0  Adductor Tubercle  1  Pes Anserine tendon  1  Infrapatellar fat pad  0  Fibular head  0  Popliteal fossa  0  (Blank rows = not tested) Graded on 0-4 scale (0 = no pain, 1 = pain,  2 = pain with wincing/grimacing/flinching, 3 = pain with withdrawal, 4 = unwilling to allow palpation), (Blank rows = not tested)  SPECIAL TESTS  Ligamentous Stability  ACL: Lachman's: R: Negative L: Not examined Anterior Drawer: R: Negative L: Not examined Pivot Shift: R: Negative L: Not examined  PCL: Posterior Drawer: R: Negative L: Not examined Reverse Lachman's: R: Negative L: Not examined Posterior Sag Sign: R: Negative L: Not examined  MCL: Valgus Stress (30 degrees flexion): R: Negative L: Not examined  LCL: Varus Stress (30 degrees flexion): R: Negative L: Not examined  Meniscus Tests McMurray's Test:  Medial Meniscus (Tibial ER): R: Negative L: Not examined Lateral Meniscus (Tibial IR): R: Negative L: Not examined Thessaly: R: Not examined L: Not examined Steinmann Sign I: R: Negative L: Not examined  Patellofemoral Pain Syndrome Patellar Tilt (Lateral): R: Negative L: Not examined Patellar Apprehension: R: Negative L: Not examined Squatting pain: R: Not examined L: Not examined Stair climbing pain: R: Negative L: Not examined Kneeling pain: R: Positive L: Not examined Resisted knee extension pain: R: Negative L: Not examined  Passive Accessory Intervertebral Motion Pt denies reproduction of hip or knee pain with CPA L1-L5 and UPA bilaterally L1-L5. Generally, hypomobile throughout with tenderness along spine.  Special Tests Lumbar Radiculopathy and Discogenic: Centralization and Peripheralization (SN 92, -LR 0.12): Not examined Slump (SN 83, -LR 0.32): R: Negative L: Negative SLR (SN 92, -LR 0.29): R: Negative L:  Negative Crossed SLR (SP 90): R: Negative L: Not examined  Facet Joint: Extension-Rotation (SN 100, -LR 0.0): R: Negative L: Negative  Lumbar Foraminal Stenosis: Lumbar quadrant (SN 70): R: Negative L: Negative  Hip: FABER (SN 81): R: Positive L: Negative FADIR (SN 94): R: Positive L: Negative Hip scour (SN 50): R: Negative L:  Negative Figure 4: R: Negative L: Positive for lateral L hip pain  SIJ:  Thigh Thrust (SN 88, -LR 0.18) : R: Not examined L: Not examined  Piriformis Syndrome: FAIR Test (SN 88, SP 83): R: Not examined L: Not examined  Patellar Tendinopathy Inferior pole palpation with anterior tilt: R: Negative L: Not examined     TODAY'S TREATMENT    SUBJECTIVE: No updates since prior session. No resting pain upon arrival. No specific questions or concern currently.   PAIN: none    Ther-ex  NuStep L1-3 x 6 minutes BLE only for LE strengthening and warm-up while obtaining interval history and therapist adjusting resistance to alter difficulty;  Total Gym Level 22 squats 2 x 15; TG L13 single leg squats x 10 BLE; Resisted side stepping with blue tband x multiple bouts; Seated clams with blue tband 2 x 15; Seated adductor ball squeeze 2 x 15; Seated LAQ with 5# ankle weights (AW) x 25 BLE;  Standing exercises with 5# ankle weights (AW)  Hip flexion marches x 15 BLE; Hamstring curls x 15 BLE; Hip abduction x 15 BLE; Hip extension x  15 BLE;  6" step-ups alternating leading LE x 10 BLE; STS from chair+ airex with 8# overhead med ball lift 2 x 10;    Not performed: Forward BOSU lunges x 10 with each foot forward; Lateral BOSU lunges x 10 toward each direction; Forward BOSU (round side up) step-ups x 10 with each LE; Standing heel raises x 20; Supine R SLR with 3# ankle weight (AW) RLE 2 x 15, LLE 2 x 13; Supine heel slide with manually resisted extension using bodyweight 2 x 10 BLE; Hooklying bridges 2 x 10; Hooklying clams 2 x 10; Hooklying adductor squeezes 2 x 10; Sidelying hip abduction with 3# AW 2 x 20 RLE, 2 x 10 LLE; Sidelying hip reverse clam with 3# AW 2 x 15 RLE, 2 x 10 LLE;    PATIENT EDUCATION:  Education details: Pt educated throughout session about proper posture and technique with exercises. Improved exercise technique, movement at target joints, use of target  muscles after min to mod verbal, visual, tactile cues. Person educated: Patient Education method: Explanation Education comprehension: verbalized understanding and returned demonstration   HOME EXERCISE PROGRAM:  Access Code: [email or web address] URL: [email or web address] Date: 06/02/2022 Prepared by: Roxana Hires  Exercises - Supine Quad Set (Mirrored)  - 1 x daily - 7 x weekly - 2 sets - 10 reps - 5s hold - Supine Bridge  - 1 x daily - 7 x weekly - 2 sets - 10 reps - 2s hold - Supine March with Resistance Band  - 1 x daily - 7 x weekly - 2 sets - 10 reps - 2s hold - Seated Hip Adduction Isometrics with Ball  - 1 x daily - 7 x weekly - 2 sets - 10 reps - 2s hold - Seated Hip Abduction with Resistance  - 1 x daily - 7 x weekly - 2 sets - 10 reps - 2s hold - Standing Hip Abduction with Resistance at Ankles and Counter Support  - 1 x daily - 7 x weekly - 2 sets - 10 reps - 2s hold - Standing Hip Extension with Resistance at Counter Support  - 1 x daily - 7 x weekly - 2 sets - 10 reps - 2 hold - Mini Squat with Counter Support  - 1 x daily - 7 x weekly - 2 sets - 10 reps   GOALS: Goals reviewed with patient? Yes  SHORT TERM GOALS: Target date: 06/08/2022  Pt will be independent with HEP to improve strength and decrease knee pain to improve pain-free function at home and work. Baseline:  Goal status: ONGOING   LONG TERM GOALS: Target date: 07/06/2022  Pt will increase FOTO to at least 61 to demonstrate significant improvement in function at home and work related to knee pain  Baseline: 05/11/22: 52; 06/20/22: 55; Goal status: PARTIALLY MET  2.  Pt will be able to bend and squat without an increase in her R knee pain in order to resume working in her yard/garden with less pain Baseline: 05/11/22: 3/10 R knee/hip pain; 06/20/22: 3/10 R knee/hip pain; Goal status: ONGOING  3.  Pt will increase LEFS score by at least 9 points in order demonstrate clinically significant reduction in  knee pain/disability.       Baseline: 05/11/22: 39/80; 06/20/22: 39/80; Goal status: ONGOING  4.  Pt will increase strength of R hip abduction to at least 4+/5 MMT grade in order to demonstrate improvement in strength and function and improved stability at right knee . Baseline: 05/11/22:  4-/5; 06/20/22: 4/5 (15# on dynamometer); Goal status: INITIAL   ASSESSMENT:  CLINICAL IMPRESSION: Pt demonstrates excellent motivation and effort during session today. Progressed strengthening during session and she denies any increase in pain. No updates to HEP during session today but will consider at future session. Pt encouraged to continue current program and follow-up as scheduled. She will benefit from PT services to address deficits in strength and range of motion in order to return to full function at home with less pain.  OBJECTIVE IMPAIRMENTS: Abnormal gait, decreased strength, and pain.   ACTIVITY LIMITATIONS: lifting and squatting  PARTICIPATION LIMITATIONS: cleaning, shopping, community activity, occupation, and yard work  PERSONAL FACTORS: Age, Time since onset of injury/illness/exacerbation, and 3+ comorbidities: DM, obesity, OSA, and afib  are also affecting patient's functional outcome.   REHAB POTENTIAL: Fair    CLINICAL DECISION MAKING: Unstable/unpredictable  EVALUATION COMPLEXITY: High   PLAN: PT FREQUENCY: 1-2x/week  PT DURATION: 8 weeks  PLANNED INTERVENTIONS: Therapeutic exercises, Therapeutic activity, Neuromuscular re-education, Balance training, Gait training, Patient/Family education, Self Care, Joint mobilization, Joint manipulation, Vestibular training, Canalith repositioning, Orthotic/Fit training, DME instructions, Dry Needling, Electrical stimulation, Spinal manipulation, Spinal mobilization, Cryotherapy, Moist heat, Taping, Traction, Ultrasound, Ionotophoresis 65m/ml Dexamethasone, Manual therapy, and Re-evaluation.  PLAN FOR NEXT SESSION:  progress strengthening  for RLE, review/modify HEP as needed;   JLyndel SafeHuprich PT, DPT, GCS  Dera Vanaken, PT 06/23/2022, 9:59 PM

## 2022-06-23 ENCOUNTER — Ambulatory Visit: Payer: Medicare Other | Attending: Family Medicine

## 2022-06-23 DIAGNOSIS — M25551 Pain in right hip: Secondary | ICD-10-CM | POA: Insufficient documentation

## 2022-06-23 DIAGNOSIS — M25561 Pain in right knee: Secondary | ICD-10-CM | POA: Diagnosis not present

## 2022-06-23 DIAGNOSIS — G8929 Other chronic pain: Secondary | ICD-10-CM | POA: Diagnosis not present

## 2022-06-26 ENCOUNTER — Ambulatory Visit: Payer: Medicare Other

## 2022-06-26 DIAGNOSIS — I4819 Other persistent atrial fibrillation: Secondary | ICD-10-CM

## 2022-06-26 LAB — CUP PACEART REMOTE DEVICE CHECK
Date Time Interrogation Session: 20240209230840
Implantable Pulse Generator Implant Date: 20230928

## 2022-06-27 ENCOUNTER — Ambulatory Visit: Payer: Medicare Other

## 2022-06-27 DIAGNOSIS — K080 Exfoliation of teeth due to systemic causes: Secondary | ICD-10-CM | POA: Diagnosis not present

## 2022-06-27 DIAGNOSIS — G8929 Other chronic pain: Secondary | ICD-10-CM | POA: Diagnosis not present

## 2022-06-27 DIAGNOSIS — M25551 Pain in right hip: Secondary | ICD-10-CM

## 2022-06-27 DIAGNOSIS — M25561 Pain in right knee: Secondary | ICD-10-CM | POA: Diagnosis not present

## 2022-06-27 NOTE — Therapy (Unsigned)
OUTPATIENT PHYSICAL THERAPY KNEE TREATMENT  Patient Name: Brandy [name] MRN: [id number] DOB:1955/06/09, 67 y.o., female Today's Date: 06/29/2022  END OF SESSION:   PT End of Session - 06/29/22 1029     Visit Number 12    Number of Visits 17    Date for PT Re-Evaluation 07/06/22    Authorization Type [employer] [facility]    Authorization Time Period eval: 05/11/22    PT Start Time 1703    PT Stop Time 1745    PT Time Calculation (min) 42 min    Equipment Utilized During Treatment Gait belt    Activity Tolerance Patient tolerated treatment well;No increased pain    Behavior During Therapy [facility] for tasks assessed/performed            Past Medical History:  Diagnosis Date   Acute lower GI bleeding 01/19/2020   Arthritis    Atrial fibrillation with rapid ventricular response (HCC)    Chicken pox    Chronic systolic CHF (congestive heart failure) ([place])    a. 03/2016 Echo: Ef 15-20%, diff HK, ant AK;  b. 05/2016 Echo: Ef 30-35%, diff HK, mildly dil LA/RA.   Diabetes mellitus without complication (HCC)    Dysrhythmia    GERD (gastroesophageal reflux disease)    Heart murmur    Hip discomfort    been going on for 40 years    History of kidney stones    Hyperlipidemia associated with type 2 diabetes mellitus ([place]) 05/12/2021   Hypertension    Moderate mitral regurgitation    a. 03/2016 Echo: mod MR in setting of LV dysfxn.   Motion sickness    car - back seat   NICM (nonischemic cardiomyopathy) ([place])    a. 03/2016 Echo: EF 15-20%, diff HK, ant AK, mod MR, mod dil LA, mildly dil RA;  b. 04/2016 Cath: nl cors;  c. 05/2016 Echo: EF 30-35%, diff HK.   Obesity    Obstructive sleep apnea    compliant with CPAP   Persistent atrial fibrillation ([place])    a. Dx 03/2016;  b. CHA2DS2VASc = 2-->Eliquis 21m BID;  b. 05/2016 Failed DCCV x 4.   Sleep apnea    Stomach irritation    Visit for monitoring Tikosyn therapy 07/24/2016   Past Surgical History:  Procedure Laterality Date    ATRIAL FIBRILLATION ABLATION N/A 12/09/2020   Procedure: ATRIAL FIBRILLATION ABLATION;  Surgeon: LVickie Epley MD;  [place] [facility];  Service: Cardiovascular;  Laterality: N/A;   BIOPSY N/A 01/12/2020   Procedure: BIOPSY;  Surgeon: TVirgel Manifold MD;  [place]  Service: Endoscopy;  Laterality: N/A;   CARDIAC CATHETERIZATION N/A 04/17/2016   Procedure: Left Heart Cath and Coronary Angiography;  Surgeon: MWellington Hampshire MD;  [place] [facility];  Service: Cardiovascular;  Laterality: N/A;   CARDIOVERSION N/A 10/22/2020   Procedure: CARDIOVERSION;  Surgeon: ENelva Bush MD;  [facility];  Service: Cardiovascular;  Laterality: N/A;   CHOLECYSTECTOMY     COLON SURGERY  2021   COLONOSCOPY N/A 01/20/2020   Procedure: COLONOSCOPY;  Surgeon: LLesly Rubenstein MD;  Location: [facility];  Service: Endoscopy;  Laterality: N/A;   COLONOSCOPY WITH PROPOFOL N/A 01/12/2020   Procedure: COLONOSCOPY WITH PROPOFOL;  Surgeon: TVirgel Manifold MD;  [place]  Service: Endoscopy;  Laterality: N/A;  Diabetic - oral meds sleep apnea   ELECTROPHYSIOLOGIC STUDY N/A 05/26/2016   Procedure: Cardioversion;  Surgeon: MMertie Clause  Fletcher Anon, MD;  Location: [facility];  Service: Cardiovascular;  Laterality: N/A;   ESOPHAGOGASTRODUODENOSCOPY (EGD) WITH PROPOFOL N/A 01/12/2020   Procedure: ESOPHAGOGASTRODUODENOSCOPY (EGD) WITH PROPOFOL;  Surgeon: Virgel Manifold, MD;  Location: [place];  Service: Endoscopy;  Laterality: N/A;   POLYPECTOMY N/A 01/12/2020   Procedure: POLYPECTOMY;  Surgeon: Virgel Manifold, MD;  Location: [place];  Service: Endoscopy;  Laterality: N/A;   Patient Active Problem List   Diagnosis Date Noted   Pes anserinus bursitis of right knee 04/19/2022   Primary osteoarthritis of right knee 03/27/2022   Greater 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 03/27/2022   It band  syndrome, right 03/27/2022   Hyperlipidemia associated with type 2 diabetes mellitus ([place]) 05/12/2021   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   Varicose veins of leg with pain, bilateral 07/06/2020   Acquired thrombophilia ([place]) 05/24/2020   Type II diabetes mellitus with complication ([place]) 123456   Gastric polyp    Colon polyps    Varicose veins of both lower extremities 10/20/2019   Dysphagia 10/20/2019   Osteoarthritis of right hand 10/31/2016   Nonischemic cardiomyopathy ([place]) 04/14/2016   BMI 33.0-33.9,adult 04/14/2016   PCP: Dr. Halina Maidens  REFERRING PROVIDER: Dr. Rosette Reveal  REFERRING DIAG: M17.11 (ICD-10-CM) - Primary osteoarthritis of right knee, M76.31 (ICD-10-CM) - It band syndrome, right, M25.551 (ICD-10-CM) - Greater 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  RATIONALE FOR EVALUATION AND TREATMENT: Rehabilitation  THERAPY DIAG: Chronic pain of right knee  Pain in right hip  ONSET DATE: August 2023  FOLLOW-UP APPT SCHEDULED WITH REFERRING PROVIDER: Yes   FROM INITIAL EVALUATION SUBJECTIVE:                                                                                                                                                                                         SUBJECTIVE STATEMENT:  R hip and R knee pain  PERTINENT HISTORY:  Pt reports history of chronic R hip pain for decades with recent onset of R knee pain in August 2023. No known trauma but pt thinks she might have aggravated her knee walking across uneven grass. She saw Dr. Zigmund Daniel and eventually had steroid injections in her R greater trochanter, R knee injection, and R pes bursa on 04/18/22. Radiographs (03/14/22) of R knee showed mild degenerative changes at the patellofemoral joint, worse laterally. She receives chiropractic care every 2 weeks for her low back/hip pain and also sees massage therapy once/month.   PAIN:    Pain Intensity: Present: 1/10, Best: 0/10,  Worst: 3/10 Pain location: Medial R knee pain Pain Quality: pressure, sharp Radiating: No  Numbness/Tingling:  No Focal Weakness: Yes, R knee feels unstable and feels like it will hyperextend Aggravating factors: Extended standing, standing after extended sitting, squatting,  Relieving factors: resting, ice, elevation, heat, antiinflammatories, using cane and knee brace; 24-hour pain behavior: activity-dependent How long can you stand: 10 minutes History of prior back, hip, knee injury, pain, surgery, or therapy: Yes, history of chronic back and R hip pain. No prior history of orthopedic surgery to back, hip, or knee. Dominant hand: right Imaging: Yes, see history Red flags: Negative for bowel/bladder changes, saddle paresthesia, personal history of cancer, h/o spinal tumors, h/o compression fx, h/o abdominal aneurysm, abdominal pain, chills/fever, night sweats, nausea, vomiting, unrelenting pain, first onset of insidious LBP <19 y/o  PRECAUTIONS: None  WEIGHT BEARING RESTRICTIONS: No  FALLS: Has patient fallen in last 6 months? No  Living Environment Lives with: lives alone Lives in: House/apartment Stairs: Yes: External: 3 steps; can reach both Has following equipment at home: Single point cane  Prior level of function: Independent  Occupational demands: Pt is an assessment specialist at [employer] (has to walk a long way to get to the testing room).   Hobbies: Pottery (currently not limited but initially had trouble sitting at the wheel), gardening/yard work;  Patient Goals: walking across the yard with improved confidence, lifting heavier objects at home without pain in hip  OBJECTIVE:  Patient Surveys  LEFS 39/80 FOTO 52, predicted improvement to 76  Cognition Patient is oriented to person, place, and time.  Recent memory is intact.  Remote memory is intact.  Attention span and concentration are intact.  Expressive speech is intact.  Patient's fund of knowledge is within  normal limits for educational level.    Gross Musculoskeletal Assessment Tremor: None Bulk: Normal Tone: Normal  GAIT: Full gait assessment deferred however gait is notably antalgic on RLE  Posture: Forward head/rounded shoulders in sitting, full posture assessment deferre;  AROM AROM (Normal range in degrees) AROM    Hip Right Left  Flexion (125) WNL WNL  Extension (15)    Abduction (40)    Adduction     Internal Rotation (45)    External Rotation (45)        Knee    Flexion (135) WNL WNL  Extension (0) WNL WNL      Ankle    Dorsiflexion (20)    Plantarflexion (50)    Inversion (35)    Eversion (15    (* = pain; Blank rows = not tested)  LE MMT: MMT (out of 5) Right  Left   Hip flexion 4 4+  Hip extension 4- 4  Hip abduction 4- 4  Hip adduction 4- 4+  Hip internal rotation 4 5  Hip external rotation 4 5  Knee flexion 4 4+  Knee extension 5 5  Ankle dorsiflexion 4+ 4+  Ankle plantarflexion    Ankle inversion    Ankle eversion    (* = pain; Blank rows = not tested)  Sensation Pt reports decreased sensation in RLE L4-L5 but otherwise intact and symmetrical. Proprioception, and hot/cold testing deferred on this date.  Muscle Length Hamstrings: R: Positive for tightness at 80 degrees L: Positive for tightness at 80 degrees  Quadriceps Pat Patrick): R: Not examined L: Not examined Hip flexors Marcello Moores): R: Not examined L: Not examined IT band Nicoletta Dress): R: Not examined L: Not examined  Palpation Location LEFT  RIGHT           Quadriceps  0  Medial Hamstrings  0  Lateral Hamstrings  0  Lateral Hamstring tendon  0  Medial Hamstring tendon  1  Quadriceps tendon  0  Patella  0  Patellar Tendon  0  Tibial Tuberosity  0  Medial joint line  1  Lateral joint line  0  MCL  1  LCL  0  Adductor Tubercle  1  Pes Anserine tendon  1  Infrapatellar fat pad  0  Fibular head  0  Popliteal fossa  0  (Blank rows = not tested) Graded on 0-4 scale (0 = no pain, 1 = pain,  2 = pain with wincing/grimacing/flinching, 3 = pain with withdrawal, 4 = unwilling to allow palpation), (Blank rows = not tested)  SPECIAL TESTS  Ligamentous Stability  ACL: Lachman's: R: Negative L: Not examined Anterior Drawer: R: Negative L: Not examined Pivot Shift: R: Negative L: Not examined  PCL: Posterior Drawer: R: Negative L: Not examined Reverse Lachman's: R: Negative L: Not examined Posterior Sag Sign: R: Negative L: Not examined  MCL: Valgus Stress (30 degrees flexion): R: Negative L: Not examined  LCL: Varus Stress (30 degrees flexion): R: Negative L: Not examined  Meniscus Tests McMurray's Test:  Medial Meniscus (Tibial ER): R: Negative L: Not examined Lateral Meniscus (Tibial IR): R: Negative L: Not examined Thessaly: R: Not examined L: Not examined Steinmann Sign I: R: Negative L: Not examined  Patellofemoral Pain Syndrome Patellar Tilt (Lateral): R: Negative L: Not examined Patellar Apprehension: R: Negative L: Not examined Squatting pain: R: Not examined L: Not examined Stair climbing pain: R: Negative L: Not examined Kneeling pain: R: Positive L: Not examined Resisted knee extension pain: R: Negative L: Not examined  Passive Accessory Intervertebral Motion Pt denies reproduction of hip or knee pain with CPA L1-L5 and UPA bilaterally L1-L5. Generally, hypomobile throughout with tenderness along spine.  Special Tests Lumbar Radiculopathy and Discogenic: Centralization and Peripheralization (SN 92, -LR 0.12): Not examined Slump (SN 83, -LR 0.32): R: Negative L: Negative SLR (SN 92, -LR 0.29): R: Negative L:  Negative Crossed SLR (SP 90): R: Negative L: Not examined  Facet Joint: Extension-Rotation (SN 100, -LR 0.0): R: Negative L: Negative  Lumbar Foraminal Stenosis: Lumbar quadrant (SN 70): R: Negative L: Negative  Hip: FABER (SN 81): R: Positive L: Negative FADIR (SN 94): R: Positive L: Negative Hip scour (SN 50): R: Negative L:  Negative Figure 4: R: Negative L: Positive for lateral L hip pain  SIJ:  Thigh Thrust (SN 88, -LR 0.18) : R: Not examined L: Not examined  Piriformis Syndrome: FAIR Test (SN 88, SP 83): R: Not examined L: Not examined  Patellar Tendinopathy Inferior pole palpation with anterior tilt: R: Negative L: Not examined     TODAY'S TREATMENT    SUBJECTIVE: Reports that L knee is slightly more aggravated than R knee today, but states that pain is "not too bad." No resting pain upon arrival. No specific questions or concern currently.   PAIN: Bilateral knee pain "not too bad" and no resting pain;    Ther-ex  NuStep L1-3 x 6 minutes BLE only for LE strengthening and warm-up while obtaining interval history and therapist adjusting resistance to alter difficulty;  Resisted side stepping with blue tband x multiple bouts; Seated LAQ with 5# ankle weights (AW) x 25 BLE;  Standing exercises with 5# ankle weights (AW)  BLE; Hamstring curls x 15 BLE;  6" step-ups alternating leading LE 2 x 10 BLE; STS 2x10 Split squats x10 Calf raises 2x10 Lateral lunges  1x10  Not performed: Forward BOSU lunges x 10 with each foot forward; Lateral BOSU lunges x 10 toward each direction; Forward BOSU (round side up) step-ups x 10 with each LE; Standing heel raises x 20; Supine R SLR with 3# ankle weight (AW) RLE 2 x 15, LLE 2 x 13; Supine heel slide with manually resisted extension using bodyweight 2 x 10 BLE; Hooklying bridges 2 x 10; Hooklying clams 2 x 10; Hooklying adductor squeezes 2 x 10; Sidelying hip abduction with 3# AW 2 x 20 RLE, 2 x 10 LLE; Sidelying hip reverse clam with 3# AW 2 x 15 RLE, 2 x 10 LLE; Seated clams with blue tband 2 x 15; Seated adductor ball squeeze 2 x 15; Total Gym Level 22 squats 2 x 15; TG L13 single leg squats x 10 BLE; Hip abduction x 15 BLE; Hip extension x 15 BLE;    PATIENT EDUCATION:  Education details: Pt educated throughout session about proper posture  and technique with exercises. Improved exercise technique, movement at target joints, use of target muscles after min to mod verbal, visual, tactile cues. Person educated: Patient Education method: Explanation Education comprehension: verbalized understanding and returned demonstration   HOME EXERCISE PROGRAM:  Access Code: [email or web address] URL: [email or web address] Date: 06/02/2022 Prepared by: Roxana Hires  Exercises - Supine Quad Set (Mirrored)  - 1 x daily - 7 x weekly - 2 sets - 10 reps - 5s hold - Supine Bridge  - 1 x daily - 7 x weekly - 2 sets - 10 reps - 2s hold - Supine March with Resistance Band  - 1 x daily - 7 x weekly - 2 sets - 10 reps - 2s hold - Seated Hip Adduction Isometrics with Ball  - 1 x daily - 7 x weekly - 2 sets - 10 reps - 2s hold - Seated Hip Abduction with Resistance  - 1 x daily - 7 x weekly - 2 sets - 10 reps - 2s hold - Standing Hip Abduction with Resistance at Ankles and Counter Support  - 1 x daily - 7 x weekly - 2 sets - 10 reps - 2s hold - Standing Hip Extension with Resistance at Counter Support  - 1 x daily - 7 x weekly - 2 sets - 10 reps - 2 hold - Mini Squat with Counter Support  - 1 x daily - 7 x weekly - 2 sets - 10 reps   GOALS: Goals reviewed with patient? Yes  SHORT TERM GOALS: Target date: 06/08/2022  Pt will be independent with HEP to improve strength and decrease knee pain to improve pain-free function at home and work. Baseline:  Goal status: ONGOING   LONG TERM GOALS: Target date: 07/06/2022  Pt will increase FOTO to at least 61 to demonstrate significant improvement in function at home and work related to knee pain  Baseline: 05/11/22: 52; 06/20/22: 55; Goal status: PARTIALLY MET  2.  Pt will be able to bend and squat without an increase in her R knee pain in order to resume working in her yard/garden with less pain Baseline: 05/11/22: 3/10 R knee/hip pain; 06/20/22: 3/10 R knee/hip pain; Goal status: ONGOING  3.  Pt will  increase LEFS score by at least 9 points in order demonstrate clinically significant reduction in knee pain/disability.       Baseline: 05/11/22: 39/80; 06/20/22: 39/80; Goal status: ONGOING  4.  Pt will increase strength of R hip abduction to at least 4+/5 MMT grade in order  to demonstrate improvement in strength and function and improved stability at right knee . Baseline: 05/11/22: 4-/5; 06/20/22: 4/5 (15# on dynamometer); Goal status: INITIAL   ASSESSMENT:  CLINICAL IMPRESSION: Pt demonstrates excellent motivation and effort during session today. Able to participate in all therapeutic interventions today with no reports of increased knee pain. Does require verbal cueing to maintain proper form for lateral lunges and split squats. Will continue to benefit from continued skilled PT intervention in order to address limitations in strength, gait, and pain in order to promote optimal return to PLOF.  OBJECTIVE IMPAIRMENTS: Abnormal gait, decreased strength, and pain.   ACTIVITY LIMITATIONS: lifting and squatting  PARTICIPATION LIMITATIONS: cleaning, shopping, community activity, occupation, and yard work  PERSONAL FACTORS: Age, Time since onset of injury/illness/exacerbation, and 3+ comorbidities: DM, obesity, OSA, and afib  are also affecting patient's functional outcome.   REHAB POTENTIAL: Fair    CLINICAL DECISION MAKING: Unstable/unpredictable  EVALUATION COMPLEXITY: High   PLAN: PT FREQUENCY: 1-2x/week  PT DURATION: 8 weeks  PLANNED INTERVENTIONS: Therapeutic exercises, Therapeutic activity, Neuromuscular re-education, Balance training, Gait training, Patient/Family education, Self Care, Joint mobilization, Joint manipulation, Vestibular training, Canalith repositioning, Orthotic/Fit training, DME instructions, Dry Needling, Electrical stimulation, Spinal manipulation, Spinal mobilization, Cryotherapy, Moist heat, Taping, Traction, Ultrasound, Ionotophoresis 32m/ml Dexamethasone,  Manual therapy, and Re-evaluation.  PLAN FOR NEXT SESSION:  progress strengthening for RLE, review/modify HEP as needed;   JLyndel SafeHuprich PT, DPT, GCS  Aala Ransom, PT 06/29/2022, 10:55 AM

## 2022-06-27 NOTE — Progress Notes (Signed)
Care[facility] Summary Report / Loop Recorder 

## 2022-06-30 ENCOUNTER — Ambulatory Visit: Payer: Medicare Other

## 2022-06-30 DIAGNOSIS — G8929 Other chronic pain: Secondary | ICD-10-CM | POA: Diagnosis not present

## 2022-06-30 DIAGNOSIS — M25551 Pain in right hip: Secondary | ICD-10-CM | POA: Diagnosis not present

## 2022-06-30 DIAGNOSIS — M25561 Pain in right knee: Secondary | ICD-10-CM | POA: Diagnosis not present

## 2022-06-30 NOTE — Therapy (Signed)
OUTPATIENT PHYSICAL THERAPY KNEE TREATMENT  Patient Name: Brandy [name] MRN: [id number] DOB:1956-03-14, 67 y.o., female Today's Date: 06/30/2022  END OF SESSION:   PT End of Session - 06/30/22 0947     Visit Number 13    Number of Visits 17    Date for PT Re-Evaluation 07/06/22    Authorization Type [employer] [facility]    Authorization Time Period eval: 05/11/22    PT Start Time 0935    PT Stop Time 1020    PT Time Calculation (min) 45 min    Equipment Utilized During Treatment Gait belt    Activity Tolerance Patient tolerated treatment well;No increased pain    Behavior During Therapy [facility] for tasks assessed/performed            Past Medical History:  Diagnosis Date   Acute lower GI bleeding 01/19/2020   Arthritis    Atrial fibrillation with rapid ventricular response (HCC)    Chicken pox    Chronic systolic CHF (congestive heart failure) ([place])    a. 03/2016 Echo: Ef 15-20%, diff HK, ant AK;  b. 05/2016 Echo: Ef 30-35%, diff HK, mildly dil LA/RA.   Diabetes mellitus without complication (HCC)    Dysrhythmia    GERD (gastroesophageal reflux disease)    Heart murmur    Hip discomfort    been going on for 40 years    History of kidney stones    Hyperlipidemia associated with type 2 diabetes mellitus ([place]) 05/12/2021   Hypertension    Moderate mitral regurgitation    a. 03/2016 Echo: mod MR in setting of LV dysfxn.   Motion sickness    car - back seat   NICM (nonischemic cardiomyopathy) ([place])    a. 03/2016 Echo: EF 15-20%, diff HK, ant AK, mod MR, mod dil LA, mildly dil RA;  b. 04/2016 Cath: nl cors;  c. 05/2016 Echo: EF 30-35%, diff HK.   Obesity    Obstructive sleep apnea    compliant with CPAP   Persistent atrial fibrillation ([place])    a. Dx 03/2016;  b. CHA2DS2VASc = 2-->Eliquis 21m BID;  b. 05/2016 Failed DCCV x 4.   Sleep apnea    Stomach irritation    Visit for monitoring Tikosyn therapy 07/24/2016   Past Surgical History:  Procedure Laterality Date    ATRIAL FIBRILLATION ABLATION N/A 12/09/2020   Procedure: ATRIAL FIBRILLATION ABLATION;  Surgeon: LVickie Epley MD;  [facility];  Service: Cardiovascular;  Laterality: N/A;   BIOPSY N/A 01/12/2020   Procedure: BIOPSY;  Surgeon: TVirgel Manifold MD;  [place] [facility]  Service: Endoscopy;  Laterality: N/A;   CARDIAC CATHETERIZATION N/A 04/17/2016   Procedure: Left Heart Cath and Coronary Angiography;  Surgeon: MWellington Hampshire MD;  [facility];  Service: Cardiovascular;  Laterality: N/A;   CARDIOVERSION N/A 10/22/2020   Procedure: CARDIOVERSION;  Surgeon: ENelva Bush MD;  [facility];  Service: Cardiovascular;  Laterality: N/A;   CHOLECYSTECTOMY     COLON SURGERY  2021   COLONOSCOPY N/A 01/20/2020   Procedure: COLONOSCOPY;  Surgeon: LLesly Rubenstein MD;  Location: [facility];  Service: Endoscopy;  Laterality: N/A;   COLONOSCOPY WITH PROPOFOL N/A 01/12/2020   Procedure: COLONOSCOPY WITH PROPOFOL;  Surgeon: TVirgel Manifold MD;  [place] [facility]  Service: Endoscopy;  Laterality: N/A;  Diabetic - oral meds sleep apnea   ELECTROPHYSIOLOGIC STUDY N/A 05/26/2016   Procedure: Cardioversion;  Surgeon: MMertie Clause  Fletcher Anon, MD;  Location: [facility];  Service: Cardiovascular;  Laterality: N/A;   ESOPHAGOGASTRODUODENOSCOPY (EGD) WITH PROPOFOL N/A 01/12/2020   Procedure: ESOPHAGOGASTRODUODENOSCOPY (EGD) WITH PROPOFOL;  Surgeon: Virgel Manifold, MD;  Location: [place];  Service: Endoscopy;  Laterality: N/A;   POLYPECTOMY N/A 01/12/2020   Procedure: POLYPECTOMY;  Surgeon: Virgel Manifold, MD;  Location: [place];  Service: Endoscopy;  Laterality: N/A;   Patient Active Problem List   Diagnosis Date Noted   Pes anserinus bursitis of right knee 04/19/2022   Primary osteoarthritis of right knee 03/27/2022   Greater 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 03/27/2022   It band  syndrome, right 03/27/2022   Hyperlipidemia associated with type 2 diabetes mellitus ([place]) 05/12/2021   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   Varicose veins of leg with pain, bilateral 07/06/2020   Acquired thrombophilia ([place]) 05/24/2020   Type II diabetes mellitus with complication ([place]) 123456   Gastric polyp    Colon polyps    Varicose veins of both lower extremities 10/20/2019   Dysphagia 10/20/2019   Osteoarthritis of right hand 10/31/2016   Nonischemic cardiomyopathy ([place]) 04/14/2016   BMI 33.0-33.9,adult 04/14/2016   PCP: Dr. Halina Maidens  REFERRING PROVIDER: Dr. Rosette Reveal  REFERRING DIAG: M17.11 (ICD-10-CM) - Primary osteoarthritis of right knee, M76.31 (ICD-10-CM) - It band syndrome, right, M25.551 (ICD-10-CM) - Greater 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  RATIONALE FOR EVALUATION AND TREATMENT: Rehabilitation  THERAPY DIAG: Chronic pain of right knee  Pain in right hip  ONSET DATE: August 2023  FOLLOW-UP APPT SCHEDULED WITH REFERRING PROVIDER: Yes   FROM INITIAL EVALUATION SUBJECTIVE:                                                                                                                                                                                         SUBJECTIVE STATEMENT:  R hip and R knee pain  PERTINENT HISTORY:  Pt reports history of chronic R hip pain for decades with recent onset of R knee pain in August 2023. No known trauma but pt thinks she might have aggravated her knee walking across uneven grass. She saw Dr. Zigmund Daniel and eventually had steroid injections in her R greater trochanter, R knee injection, and R pes bursa on 04/18/22. Radiographs (03/14/22) of R knee showed mild degenerative changes at the patellofemoral joint, worse laterally. She receives chiropractic care every 2 weeks for her low back/hip pain and also sees massage therapy once/month.   PAIN:    Pain Intensity: Present: 1/10, Best: 0/10,  Worst: 3/10 Pain location: Medial R knee pain Pain Quality: pressure, sharp Radiating: No  Numbness/Tingling:  No Focal Weakness: Yes, R knee feels unstable and feels like it will hyperextend Aggravating factors: Extended standing, standing after extended sitting, squatting,  Relieving factors: resting, ice, elevation, heat, antiinflammatories, using cane and knee brace; 24-hour pain behavior: activity-dependent How long can you stand: 10 minutes History of prior back, hip, knee injury, pain, surgery, or therapy: Yes, history of chronic back and R hip pain. No prior history of orthopedic surgery to back, hip, or knee. Dominant hand: right Imaging: Yes, see history Red flags: Negative for bowel/bladder changes, saddle paresthesia, personal history of cancer, h/o spinal tumors, h/o compression fx, h/o abdominal aneurysm, abdominal pain, chills/fever, night sweats, nausea, vomiting, unrelenting pain, first onset of insidious LBP <20 y/o  PRECAUTIONS: None  WEIGHT BEARING RESTRICTIONS: No  FALLS: Has patient fallen in last 6 months? No  Living Environment Lives with: lives alone Lives in: House/apartment Stairs: Yes: External: 3 steps; can reach both Has following equipment at home: Single point cane  Prior level of function: Independent  Occupational demands: Pt is an assessment specialist at [employer] (has to walk a long way to get to the testing room).   Hobbies: Pottery (currently not limited but initially had trouble sitting at the wheel), gardening/yard work;  Patient Goals: walking across the yard with improved confidence, lifting heavier objects at home without pain in hip  OBJECTIVE:  Patient Surveys  LEFS 39/80 FOTO 52, predicted improvement to 80  Cognition Patient is oriented to person, place, and time.  Recent memory is intact.  Remote memory is intact.  Attention span and concentration are intact.  Expressive speech is intact.  Patient's fund of knowledge is within  normal limits for educational level.    Gross Musculoskeletal Assessment Tremor: None Bulk: Normal Tone: Normal  GAIT: Full gait assessment deferred however gait is notably antalgic on RLE  Posture: Forward head/rounded shoulders in sitting, full posture assessment deferre;  AROM AROM (Normal range in degrees) AROM    Hip Right Left  Flexion (125) WNL WNL  Extension (15)    Abduction (40)    Adduction     Internal Rotation (45)    External Rotation (45)        Knee    Flexion (135) WNL WNL  Extension (0) WNL WNL      Ankle    Dorsiflexion (20)    Plantarflexion (50)    Inversion (35)    Eversion (15    (* = pain; Blank rows = not tested)  LE MMT: MMT (out of 5) Right  Left   Hip flexion 4 4+  Hip extension 4- 4  Hip abduction 4- 4  Hip adduction 4- 4+  Hip internal rotation 4 5  Hip external rotation 4 5  Knee flexion 4 4+  Knee extension 5 5  Ankle dorsiflexion 4+ 4+  Ankle plantarflexion    Ankle inversion    Ankle eversion    (* = pain; Blank rows = not tested)  Sensation Pt reports decreased sensation in RLE L4-L5 but otherwise intact and symmetrical. Proprioception, and hot/cold testing deferred on this date.  Muscle Length Hamstrings: R: Positive for tightness at 80 degrees L: Positive for tightness at 80 degrees  Quadriceps Pat Patrick): R: Not examined L: Not examined Hip flexors Marcello Moores): R: Not examined L: Not examined IT band Nicoletta Dress): R: Not examined L: Not examined  Palpation Location LEFT  RIGHT           Quadriceps  0  Medial Hamstrings  0  Lateral Hamstrings  0  Lateral Hamstring tendon  0  Medial Hamstring tendon  1  Quadriceps tendon  0  Patella  0  Patellar Tendon  0  Tibial Tuberosity  0  Medial joint line  1  Lateral joint line  0  MCL  1  LCL  0  Adductor Tubercle  1  Pes Anserine tendon  1  Infrapatellar fat pad  0  Fibular head  0  Popliteal fossa  0  (Blank rows = not tested) Graded on 0-4 scale (0 = no pain, 1 = pain,  2 = pain with wincing/grimacing/flinching, 3 = pain with withdrawal, 4 = unwilling to allow palpation), (Blank rows = not tested)  SPECIAL TESTS  Ligamentous Stability  ACL: Lachman's: R: Negative L: Not examined Anterior Drawer: R: Negative L: Not examined Pivot Shift: R: Negative L: Not examined  PCL: Posterior Drawer: R: Negative L: Not examined Reverse Lachman's: R: Negative L: Not examined Posterior Sag Sign: R: Negative L: Not examined  MCL: Valgus Stress (30 degrees flexion): R: Negative L: Not examined  LCL: Varus Stress (30 degrees flexion): R: Negative L: Not examined  Meniscus Tests McMurray's Test:  Medial Meniscus (Tibial ER): R: Negative L: Not examined Lateral Meniscus (Tibial IR): R: Negative L: Not examined Thessaly: R: Not examined L: Not examined Steinmann Sign I: R: Negative L: Not examined  Patellofemoral Pain Syndrome Patellar Tilt (Lateral): R: Negative L: Not examined Patellar Apprehension: R: Negative L: Not examined Squatting pain: R: Not examined L: Not examined Stair climbing pain: R: Negative L: Not examined Kneeling pain: R: Positive L: Not examined Resisted knee extension pain: R: Negative L: Not examined  Passive Accessory Intervertebral Motion Pt denies reproduction of hip or knee pain with CPA L1-L5 and UPA bilaterally L1-L5. Generally, hypomobile throughout with tenderness along spine.  Special Tests Lumbar Radiculopathy and Discogenic: Centralization and Peripheralization (SN 92, -LR 0.12): Not examined Slump (SN 83, -LR 0.32): R: Negative L: Negative SLR (SN 92, -LR 0.29): R: Negative L:  Negative Crossed SLR (SP 90): R: Negative L: Not examined  Facet Joint: Extension-Rotation (SN 100, -LR 0.0): R: Negative L: Negative  Lumbar Foraminal Stenosis: Lumbar quadrant (SN 70): R: Negative L: Negative  Hip: FABER (SN 81): R: Positive L: Negative FADIR (SN 94): R: Positive L: Negative Hip scour (SN 50): R: Negative L:  Negative Figure 4: R: Negative L: Positive for lateral L hip pain  SIJ:  Thigh Thrust (SN 88, -LR 0.18) : R: Not examined L: Not examined  Piriformis Syndrome: FAIR Test (SN 88, SP 83): R: Not examined L: Not examined  Patellar Tendinopathy Inferior pole palpation with anterior tilt: R: Negative L: Not examined     TODAY'S TREATMENT    SUBJECTIVE: Reports that she was very sore after the last therapy session. No resting pain upon arrival. Plans to work in the yard later today. No specific questions currently.   PAIN: Denies resting pain;    Ther-ex  NuStep L1-4 x 6 minutes BLE only for LE strengthening and warm-up while obtaining interval history and therapist adjusting resistance to alter difficulty;  Double black tband resisted gait forward, backward, R lateral, and L lateral x 4 bouts each direction; Seated LAQ with 5# ankle weights (AW) 2 x 15 BLE; Standing heel raises 2 x 20;  Standing exercises with 5# ankle weights (AW)  BLE; Hamstring curls x 15 BLE; Hip abduction x 15 BLE; Hamstring curls x 15 BLE; Hip extension x 15 BLE;  Seated clams with manual resistance 2 x 15; Seated adductor squeeze with manual resistance 2 x 15; Total Gym Level 22 squats 2 x 15;   Not performed: Forward BOSU lunges x 10 with each foot forward; Lateral BOSU lunges x 10 toward each direction; Forward BOSU (round side up) step-ups x 10 with each LE; Supine R SLR with 3# ankle weight (AW) RLE 2 x 15, LLE 2 x 13; Supine heel slide with manually resisted extension using bodyweight 2 x 10 BLE; Hooklying bridges 2 x 10; Hooklying clams 2 x 10; Hooklying adductor squeezes 2 x 10; Sidelying hip abduction with 3# AW 2 x 20 RLE, 2 x 10 LLE; Sidelying hip reverse clam with 3# AW 2 x 15 RLE, 2 x 10 LLE;   PATIENT EDUCATION:  Education details: Pt educated throughout session about proper posture and technique with exercises. Improved exercise technique, movement at target joints, use of target  muscles after min to mod verbal, visual, tactile cues. Person educated: Patient Education method: Explanation Education comprehension: verbalized understanding and returned demonstration   HOME EXERCISE PROGRAM:  Access Code: [email or web address] URL: [email or web address] Date: 06/02/2022 Prepared by: Roxana Hires  Exercises - Supine Quad Set (Mirrored)  - 1 x daily - 7 x weekly - 2 sets - 10 reps - 5s hold - Supine Bridge  - 1 x daily - 7 x weekly - 2 sets - 10 reps - 2s hold - Supine March with Resistance Band  - 1 x daily - 7 x weekly - 2 sets - 10 reps - 2s hold - Seated Hip Adduction Isometrics with Ball  - 1 x daily - 7 x weekly - 2 sets - 10 reps - 2s hold - Seated Hip Abduction with Resistance  - 1 x daily - 7 x weekly - 2 sets - 10 reps - 2s hold - Standing Hip Abduction with Resistance at Ankles and Counter Support  - 1 x daily - 7 x weekly - 2 sets - 10 reps - 2s hold - Standing Hip Extension with Resistance at Counter Support  - 1 x daily - 7 x weekly - 2 sets - 10 reps - 2 hold - Mini Squat with Counter Support  - 1 x daily - 7 x weekly - 2 sets - 10 reps   GOALS: Goals reviewed with patient? Yes  SHORT TERM GOALS: Target date: 06/08/2022  Pt will be independent with HEP to improve strength and decrease knee pain to improve pain-free function at home and work. Baseline:  Goal status: ONGOING   LONG TERM GOALS: Target date: 07/06/2022  Pt will increase FOTO to at least 61 to demonstrate significant improvement in function at home and work related to knee pain  Baseline: 05/11/22: 52; 06/20/22: 55; Goal status: PARTIALLY MET  2.  Pt will be able to bend and squat without an increase in her R knee pain in order to resume working in her yard/garden with less pain Baseline: 05/11/22: 3/10 R knee/hip pain; 06/20/22: 3/10 R knee/hip pain; Goal status: ONGOING  3.  Pt will increase LEFS score by at least 9 points in order demonstrate clinically significant reduction in  knee pain/disability.       Baseline: 05/11/22: 39/80; 06/20/22: 39/80; Goal status: ONGOING  4.  Pt will increase strength of R hip abduction to at least 4+/5 MMT grade in order to demonstrate improvement in strength and function and improved stability at right knee . Baseline: 05/11/22: 4-/5; 06/20/22: 4/5 (15# on dynamometer);  Goal status: INITIAL   ASSESSMENT:  CLINICAL IMPRESSION: Pt demonstrates excellent motivation and effort during session today. Able to participate in all therapeutic interventions today. Reports mild L knee pain during Total Gym squats so decreased depth of squat which helps relieve pain. Plan to continue progressive strengthening at future sessions. Will continue to benefit from continued skilled PT intervention in order to address limitations in strength, gait, and pain in order to promote optimal return to PLOF.  OBJECTIVE IMPAIRMENTS: Abnormal gait, decreased strength, and pain.   ACTIVITY LIMITATIONS: lifting and squatting  PARTICIPATION LIMITATIONS: cleaning, shopping, community activity, occupation, and yard work  PERSONAL FACTORS: Age, Time since onset of injury/illness/exacerbation, and 3+ comorbidities: DM, obesity, OSA, and afib  are also affecting patient's functional outcome.   REHAB POTENTIAL: Fair    CLINICAL DECISION MAKING: Unstable/unpredictable  EVALUATION COMPLEXITY: High   PLAN: PT FREQUENCY: 1-2x/week  PT DURATION: 8 weeks  PLANNED INTERVENTIONS: Therapeutic exercises, Therapeutic activity, Neuromuscular re-education, Balance training, Gait training, Patient/Family education, Self Care, Joint mobilization, Joint manipulation, Vestibular training, Canalith repositioning, Orthotic/Fit training, DME instructions, Dry Needling, Electrical stimulation, Spinal manipulation, Spinal mobilization, Cryotherapy, Moist heat, Taping, Traction, Ultrasound, Ionotophoresis 62m/ml Dexamethasone, Manual therapy, and Re-evaluation.  PLAN FOR NEXT SESSION:   progress strengthening for RLE, review/modify HEP as needed;   JLyndel SafeHuprich PT, DPT, GCS  Alayssa Flinchum, PT 06/30/2022, 10:51 AM

## 2022-07-04 ENCOUNTER — Ambulatory Visit: Payer: Medicare Other

## 2022-07-04 DIAGNOSIS — M25551 Pain in right hip: Secondary | ICD-10-CM

## 2022-07-04 DIAGNOSIS — G8929 Other chronic pain: Secondary | ICD-10-CM

## 2022-07-04 DIAGNOSIS — M25561 Pain in right knee: Secondary | ICD-10-CM | POA: Diagnosis not present

## 2022-07-04 NOTE — Therapy (Signed)
OUTPATIENT PHYSICAL THERAPY KNEE TREATMENT  Patient Name: Brandy [name] MRN: [id number] DOB:[birth year], 67 y.o., female Today's Date: 07/04/2022  END OF SESSION:   PT End of Session - 07/04/22 0851     Visit Number 14    Number of Visits 17    Date for PT Re-Evaluation 07/06/22    Authorization Type [employer] [facility]    Authorization Time Period eval: 05/11/22    PT Start Time 0847    PT Stop Time 0930    PT Time Calculation (min) 43 min    Equipment Utilized During Treatment Gait belt    Activity Tolerance Patient tolerated treatment well;No increased pain    Behavior During Therapy [facility] for tasks assessed/performed            Past Medical History:  Diagnosis Date   Acute lower GI bleeding 01/19/2020   Arthritis    Atrial fibrillation with rapid ventricular response (HCC)    Chicken pox    Chronic systolic CHF (congestive heart failure) ([place])    a. 03/2016 Echo: Ef 15-20%, diff HK, ant AK;  b. 05/2016 Echo: Ef 30-35%, diff HK, mildly dil LA/RA.   Diabetes mellitus without complication (HCC)    Dysrhythmia    GERD (gastroesophageal reflux disease)    Heart murmur    Hip discomfort    been going on for 40 years    History of kidney stones    Hyperlipidemia associated with type 2 diabetes mellitus ([place]) 05/12/2021   Hypertension    Moderate mitral regurgitation    a. 03/2016 Echo: mod MR in setting of LV dysfxn.   Motion sickness    car - back seat   NICM (nonischemic cardiomyopathy) ([place])    a. 03/2016 Echo: EF 15-20%, diff HK, ant AK, mod MR, mod dil LA, mildly dil RA;  b. 04/2016 Cath: nl cors;  c. 05/2016 Echo: EF 30-35%, diff HK.   Obesity    Obstructive sleep apnea    compliant with CPAP   Persistent atrial fibrillation ([place])    a. Dx 03/2016;  b. CHA2DS2VASc = 2-->Eliquis 72m BID;  b. 05/2016 Failed DCCV x 4.   Sleep apnea    Stomach irritation    Visit for monitoring Tikosyn therapy 07/24/2016   Past Surgical History:  Procedure Laterality Date    ATRIAL FIBRILLATION ABLATION N/A 12/09/2020   Procedure: ATRIAL FIBRILLATION ABLATION;  Surgeon: LVickie Epley MD;  [facility];  Service: Cardiovascular;  Laterality: N/A;   BIOPSY N/A 01/12/2020   Procedure: BIOPSY;  Surgeon: TVirgel Manifold MD;  [place] [facility]  Service: Endoscopy;  Laterality: N/A;   CARDIAC CATHETERIZATION N/A 04/17/2016   Procedure: Left Heart Cath and Coronary Angiography;  Surgeon: MWellington Hampshire MD;  [facility];  Service: Cardiovascular;  Laterality: N/A;   CARDIOVERSION N/A 10/22/2020   Procedure: CARDIOVERSION;  Surgeon: ENelva Bush MD;  [facility];  Service: Cardiovascular;  Laterality: N/A;   CHOLECYSTECTOMY     COLON SURGERY  2021   COLONOSCOPY N/A 01/20/2020   Procedure: COLONOSCOPY;  Surgeon: LLesly Rubenstein MD;  Location: [facility];  Service: Endoscopy;  Laterality: N/A;   COLONOSCOPY WITH PROPOFOL N/A 01/12/2020   Procedure: COLONOSCOPY WITH PROPOFOL;  Surgeon: TVirgel Manifold MD;  [place] [facility]  Service: Endoscopy;  Laterality: N/A;  Diabetic - oral meds sleep apnea   ELECTROPHYSIOLOGIC STUDY N/A 05/26/2016   Procedure: Cardioversion;  Surgeon: MMertie Clause  Fletcher Anon, MD;  Location: [facility];  Service: Cardiovascular;  Laterality: N/A;   ESOPHAGOGASTRODUODENOSCOPY (EGD) WITH PROPOFOL N/A 01/12/2020   Procedure: ESOPHAGOGASTRODUODENOSCOPY (EGD) WITH PROPOFOL;  Surgeon: Virgel Manifold, MD;  Location: [place];  Service: Endoscopy;  Laterality: N/A;   POLYPECTOMY N/A 01/12/2020   Procedure: POLYPECTOMY;  Surgeon: Virgel Manifold, MD;  Location: [place];  Service: Endoscopy;  Laterality: N/A;   Patient Active Problem List   Diagnosis Date Noted   Pes anserinus bursitis of right knee 04/19/2022   Primary osteoarthritis of right knee 03/27/2022   Greater 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 03/27/2022   It band  syndrome, right 03/27/2022   Hyperlipidemia associated with type 2 diabetes mellitus ([place]) 05/12/2021   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   Varicose veins of leg with pain, bilateral 07/06/2020   Acquired thrombophilia ([place]) 05/24/2020   Type II diabetes mellitus with complication ([place]) 123456   Gastric polyp    Colon polyps    Varicose veins of both lower extremities 10/20/2019   Dysphagia 10/20/2019   Osteoarthritis of right hand 10/31/2016   Nonischemic cardiomyopathy ([place]) 04/14/2016   BMI 33.0-33.9,adult 04/14/2016   PCP: Dr. Halina Maidens  REFERRING PROVIDER: Dr. Rosette Reveal  REFERRING DIAG: M17.11 (ICD-10-CM) - Primary osteoarthritis of right knee, M76.31 (ICD-10-CM) - It band syndrome, right, M25.551 (ICD-10-CM) - Greater 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  RATIONALE FOR EVALUATION AND TREATMENT: Rehabilitation  THERAPY DIAG: Chronic pain of right knee  Pain in right hip  ONSET DATE: August 2023  FOLLOW-UP APPT SCHEDULED WITH REFERRING PROVIDER: Yes   FROM INITIAL EVALUATION SUBJECTIVE:                                                                                                                                                                                         SUBJECTIVE STATEMENT:  R hip and R knee pain  PERTINENT HISTORY:  Pt reports history of chronic R hip pain for decades with recent onset of R knee pain in August 2023. No known trauma but pt thinks she might have aggravated her knee walking across uneven grass. She saw Dr. Zigmund Daniel and eventually had steroid injections in her R greater trochanter, R knee injection, and R pes bursa on 04/18/22. Radiographs (03/14/22) of R knee showed mild degenerative changes at the patellofemoral joint, worse laterally. She receives chiropractic care every 2 weeks for her low back/hip pain and also sees massage therapy once/month.   PAIN:    Pain Intensity: Present: 1/10, Best: 0/10,  Worst: 3/10 Pain location: Medial R knee pain Pain Quality: pressure, sharp Radiating: No  Numbness/Tingling:  No Focal Weakness: Yes, R knee feels unstable and feels like it will hyperextend Aggravating factors: Extended standing, standing after extended sitting, squatting,  Relieving factors: resting, ice, elevation, heat, antiinflammatories, using cane and knee brace; 24-hour pain behavior: activity-dependent How long can you stand: 10 minutes History of prior back, hip, knee injury, pain, surgery, or therapy: Yes, history of chronic back and R hip pain. No prior history of orthopedic surgery to back, hip, or knee. Dominant hand: right Imaging: Yes, see history Red flags: Negative for bowel/bladder changes, saddle paresthesia, personal history of cancer, h/o spinal tumors, h/o compression fx, h/o abdominal aneurysm, abdominal pain, chills/fever, night sweats, nausea, vomiting, unrelenting pain, first onset of insidious LBP <19 y/o  PRECAUTIONS: None  WEIGHT BEARING RESTRICTIONS: No  FALLS: Has patient fallen in last 6 months? 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   Hobbies: Pottery (currently not limited but initially had trouble sitting at the wheel), gardening/yard work;  Patient Goals: walking across the yard with improved confidence, lifting heavier objects at home without pain in hip  OBJECTIVE:  Patient Surveys  LEFS 39/80 FOTO 52, predicted improvement to 81  Cognition Patient is oriented to person, place, and time.  Recent memory is intact.  Remote memory is intact.  Attention span and concentration are intact.  Expressive speech is intact.  Patient's fund of knowledge is within  normal limits for educational level.    Gross Musculoskeletal Assessment Tremor: None Bulk: Normal Tone: Normal  GAIT: Full gait assessment deferred however gait is notably antalgic on RLE  Posture: Forward head/rounded shoulders in sitting, full posture assessment deferre;  AROM AROM (Normal range in degrees) AROM    Hip Right Left  Flexion (125) WNL WNL  Extension (15)    Abduction (40)    Adduction     Internal Rotation (45)    External Rotation (45)        Knee    Flexion (135) WNL WNL  Extension (0) WNL WNL      Ankle    Dorsiflexion (20)    Plantarflexion (50)    Inversion (35)    Eversion (15    (* = pain; Blank rows = not tested)  LE MMT: MMT (out of 5) Right  Left   Hip flexion 4 4+  Hip extension 4- 4  Hip abduction 4- 4  Hip adduction 4- 4+  Hip internal rotation 4 5  Hip external rotation 4 5  Knee flexion 4 4+  Knee extension 5 5  Ankle dorsiflexion 4+ 4+  Ankle plantarflexion    Ankle inversion    Ankle eversion    (* = pain; Blank rows = not tested)  Sensation Pt reports decreased sensation in RLE L4-L5 but otherwise intact and symmetrical. Proprioception, and hot/cold testing deferred on this date.  Muscle Length Hamstrings: R: Positive for tightness at 80 degrees L: Positive for tightness at 80 degrees  Quadriceps Pat Patrick): R: Not examined L: Not examined Hip flexors Marcello Moores): R: Not examined L: Not examined IT band Nicoletta Dress): R: Not examined L: Not examined  Palpation Location LEFT  RIGHT           Quadriceps  0  Medial Hamstrings  0  Lateral Hamstrings  0  Lateral Hamstring tendon  0  Medial Hamstring tendon  1  Quadriceps tendon  0  Patella  0  Patellar Tendon  0  Tibial Tuberosity  0  Medial joint line  1  Lateral joint line  0  MCL  1  LCL  0  Adductor Tubercle  1  Pes Anserine tendon  1  Infrapatellar fat pad  0  Fibular head  0  Popliteal fossa  0  (Blank rows = not tested) Graded on 0-4 scale (0 = no pain, 1 = pain,  2 = pain with wincing/grimacing/flinching, 3 = pain with withdrawal, 4 = unwilling to allow palpation), (Blank rows = not tested)  SPECIAL TESTS  Ligamentous Stability  ACL: Lachman's: R: Negative L: Not examined Anterior Drawer: R: Negative L: Not examined Pivot Shift: R: Negative L: Not examined  PCL: Posterior Drawer: R: Negative L: Not examined Reverse Lachman's: R: Negative L: Not examined Posterior Sag Sign: R: Negative L: Not examined  MCL: Valgus Stress (30 degrees flexion): R: Negative L: Not examined  LCL: Varus Stress (30 degrees flexion): R: Negative L: Not examined  Meniscus Tests McMurray's Test:  Medial Meniscus (Tibial ER): R: Negative L: Not examined Lateral Meniscus (Tibial IR): R: Negative L: Not examined Thessaly: R: Not examined L: Not examined Steinmann Sign I: R: Negative L: Not examined  Patellofemoral Pain Syndrome Patellar Tilt (Lateral): R: Negative L: Not examined Patellar Apprehension: R: Negative L: Not examined Squatting pain: R: Not examined L: Not examined Stair climbing pain: R: Negative L: Not examined Kneeling pain: R: Positive L: Not examined Resisted knee extension pain: R: Negative L: Not examined  Passive Accessory Intervertebral Motion Pt denies reproduction of hip or knee pain with CPA L1-L5 and UPA bilaterally L1-L5. Generally, hypomobile throughout with tenderness along spine.  Special Tests Lumbar Radiculopathy and Discogenic: Centralization and Peripheralization (SN 92, -LR 0.12): Not examined Slump (SN 83, -LR 0.32): R: Negative L: Negative SLR (SN 92, -LR 0.29): R: Negative L:  Negative Crossed SLR (SP 90): R: Negative L: Not examined  Facet Joint: Extension-Rotation (SN 100, -LR 0.0): R: Negative L: Negative  Lumbar Foraminal Stenosis: Lumbar quadrant (SN 70): R: Negative L: Negative  Hip: FABER (SN 81): R: Positive L: Negative FADIR (SN 94): R: Positive L: Negative Hip scour (SN 50): R: Negative L:  Negative Figure 4: R: Negative L: Positive for lateral L hip pain  SIJ:  Thigh Thrust (SN 88, -LR 0.18) : R: Not examined L: Not examined  Piriformis Syndrome: FAIR Test (SN 88, SP 83): R: Not examined L: Not examined  Patellar Tendinopathy Inferior pole palpation with anterior tilt: R: Negative L: Not examined     TODAY'S TREATMENT    SUBJECTIVE: Reports mild soreness after the last therapy session but not excessive. No resting R hip and R knee pain upon arrival. She reports pain in her L knee this morning which is acute on chronic. She spent a lot of time working in the yard over the weekend. No specific questions currently.   PAIN: Denies resting pain;    Ther-ex  NuStep L1-4 x 6 minutes BLE only for LE strengthening and warm-up while obtaining interval history and therapist adjusting resistance to alter difficulty;  Forward 6" step-ups leading with RLE up/LLE down x 10, 12" step x 10; Total Gym (TG) Level 15 (L15) R single leg squats 2 x 10; TG L15 double leg heel raises 2 x 20;  Nautilus resisted gait  with 95# forward, backward, R lateral, and L lateral x 5 each direction;  Seated clams with blue tband 2 x 15; Seated adductor ball squeeze 2 x 15; Forward BOSU lunges leading with RLE x 10; R lateral BOSU lunges x 10 ;   Not performed: Forward BOSU (round side up) step-ups x 10 with each LE; Supine R SLR with 3# ankle weight (AW) RLE 2 x 15, LLE 2 x 13; Supine heel slide with manually resisted extension using bodyweight 2 x 10 BLE; Hooklying bridges 2 x 10; Hooklying clams 2 x 10; Hooklying adductor squeezes 2 x 10; Sidelying hip abduction with 3# AW 2 x 20 RLE, 2 x 10 LLE; Sidelying hip reverse clam with 3# AW 2 x 15 RLE, 2 x 10 LLE; Seated LAQ with 5# ankle weights (AW) 2 x 15 BLE; Standing exercises with 5# ankle weights (AW)  BLE; Hamstring curls x 15 BLE; Hip abduction x 15 BLE; Hamstring curls x 15 BLE; Hip extension x 15 BLE;   PATIENT EDUCATION:   Education details: Pt educated throughout session about proper posture and technique with exercises. Improved exercise technique, movement at target joints, use of target muscles after min to mod verbal, visual, tactile cues. Person educated: Patient Education method: Explanation Education comprehension: verbalized understanding and returned demonstration   HOME EXERCISE PROGRAM:  Access Code: [email or web address] URL: [email or web address] Date: 06/02/2022 Prepared by: Roxana Hires  Exercises - Supine Quad Set (Mirrored)  - 1 x daily - 7 x weekly - 2 sets - 10 reps - 5s hold - Supine Bridge  - 1 x daily - 7 x weekly - 2 sets - 10 reps - 2s hold - Supine March with Resistance Band  - 1 x daily - 7 x weekly - 2 sets - 10 reps - 2s hold - Seated Hip Adduction Isometrics with Ball  - 1 x daily - 7 x weekly - 2 sets - 10 reps - 2s hold - Seated Hip Abduction with Resistance  - 1 x daily - 7 x weekly - 2 sets - 10 reps - 2s hold - Standing Hip Abduction with Resistance at Ankles and Counter Support  - 1 x daily - 7 x weekly - 2 sets - 10 reps - 2s hold - Standing Hip Extension with Resistance at Counter Support  - 1 x daily - 7 x weekly - 2 sets - 10 reps - 2 hold - Mini Squat with Counter Support  - 1 x daily - 7 x weekly - 2 sets - 10 reps   GOALS: Goals reviewed with patient? Yes  SHORT TERM GOALS: Target date: 06/08/2022  Pt will be independent with HEP to improve strength and decrease knee pain to improve pain-free function at home and work. Baseline:  Goal status: ONGOING   LONG TERM GOALS: Target date: 07/06/2022  Pt will increase FOTO to at least 61 to demonstrate significant improvement in function at home and work related to knee pain  Baseline: 05/11/22: 52; 06/20/22: 55; Goal status: PARTIALLY MET  2.  Pt will be able to bend and squat without an increase in her R knee pain in order to resume working in her yard/garden with less pain Baseline: 05/11/22: 3/10 R knee/hip  pain; 06/20/22: 3/10 R knee/hip pain; Goal status: ONGOING  3.  Pt will increase LEFS score by at least 9 points in order demonstrate clinically significant reduction in knee pain/disability.       Baseline: 05/11/22: 39/80; 06/20/22: 39/80; Goal status:  ONGOING  4.  Pt will increase strength of R hip abduction to at least 4+/5 MMT grade in order to demonstrate improvement in strength and function and improved stability at right knee . Baseline: 05/11/22: 4-/5; 06/20/22: 4/5 (15# on dynamometer); Goal status: INITIAL   ASSESSMENT:  CLINICAL IMPRESSION: Pt demonstrates excellent motivation and effort during session today. Able to participate in all therapeutic interventions today. Utilized exercises today to minimize L knee pain. Plan to continue progressive strengthening at future sessions. She will continue to benefit from continued skilled PT intervention in order to address limitations in strength, gait, and pain in order to promote optimal return to PLOF.  OBJECTIVE IMPAIRMENTS: Abnormal gait, decreased strength, and pain.   ACTIVITY LIMITATIONS: lifting and squatting  PARTICIPATION LIMITATIONS: cleaning, shopping, community activity, occupation, and yard work  PERSONAL FACTORS: Age, Time since onset of injury/illness/exacerbation, and 3+ comorbidities: DM, obesity, OSA, and afib  are also affecting patient's functional outcome.   REHAB POTENTIAL: Fair    CLINICAL DECISION MAKING: Unstable/unpredictable  EVALUATION COMPLEXITY: High   PLAN: PT FREQUENCY: 1-2x/week  PT DURATION: 8 weeks  PLANNED INTERVENTIONS: Therapeutic exercises, Therapeutic activity, Neuromuscular re-education, Balance training, Gait training, Patient/Family education, Self Care, Joint mobilization, Joint manipulation, Vestibular training, Canalith repositioning, Orthotic/Fit training, DME instructions, Dry Needling, Electrical stimulation, Spinal manipulation, Spinal mobilization, Cryotherapy, Moist heat,  Taping, Traction, Ultrasound, Ionotophoresis 60m/ml Dexamethasone, Manual therapy, and Re-evaluation.  PLAN FOR NEXT SESSION:  Recertification vs discharge, progress strengthening for RLE, review/modify HEP as needed;   JPhillips GroutPT, DPT, GCS  Lonney Revak, PT 07/04/2022, 5:13 PM

## 2022-07-07 ENCOUNTER — Ambulatory Visit: Payer: Medicare Other

## 2022-07-07 DIAGNOSIS — G8929 Other chronic pain: Secondary | ICD-10-CM | POA: Diagnosis not present

## 2022-07-07 DIAGNOSIS — M25551 Pain in right hip: Secondary | ICD-10-CM | POA: Diagnosis not present

## 2022-07-07 DIAGNOSIS — M25561 Pain in right knee: Secondary | ICD-10-CM | POA: Diagnosis not present

## 2022-07-07 NOTE — Therapy (Signed)
OUTPATIENT PHYSICAL THERAPY KNEE TREATMENT/RECERTIFICATION  Patient Name: Brandy [name] MRN: [id number] DOB:[birth year], 67 y.o., female Today's Date: 07/07/2022  END OF SESSION:  PT End of Session - 07/07/22 0810     Visit Number 15    Number of Visits 25    Date for PT Re-Evaluation 08/04/22    Authorization Type [employer] [facility]    Authorization Time Period eval: 05/11/22    PT Start Time 0759    PT Stop Time 0845    PT Time Calculation (min) 46 min    Equipment Utilized During Treatment --    Activity Tolerance Patient tolerated treatment well    Behavior During Therapy [facility] for tasks assessed/performed            Past Medical History:  Diagnosis Date   Acute lower GI bleeding 01/19/2020   Arthritis    Atrial fibrillation with rapid ventricular response (HCC)    Chicken pox    Chronic systolic CHF (congestive heart failure) ([place])    a. 03/2016 Echo: Ef 15-20%, diff HK, ant AK;  b. 05/2016 Echo: Ef 30-35%, diff HK, mildly dil LA/RA.   Diabetes mellitus without complication (HCC)    Dysrhythmia    GERD (gastroesophageal reflux disease)    Heart murmur    Hip discomfort    been going on for 40 years    History of kidney stones    Hyperlipidemia associated with type 2 diabetes mellitus ([place]) 05/12/2021   Hypertension    Moderate mitral regurgitation    a. 03/2016 Echo: mod MR in setting of LV dysfxn.   Motion sickness    car - back seat   NICM (nonischemic cardiomyopathy) ([place])    a. 03/2016 Echo: EF 15-20%, diff HK, ant AK, mod MR, mod dil LA, mildly dil RA;  b. 04/2016 Cath: nl cors;  c. 05/2016 Echo: EF 30-35%, diff HK.   Obesity    Obstructive sleep apnea    compliant with CPAP   Persistent atrial fibrillation ([place])    a. Dx 03/2016;  b. CHA2DS2VASc = 2-->Eliquis '5mg'$  BID;  b. 05/2016 Failed DCCV x 4.   Sleep apnea    Stomach irritation    Visit for monitoring Tikosyn therapy 07/24/2016   Past Surgical History:  Procedure Laterality Date   ATRIAL  FIBRILLATION ABLATION N/A 12/09/2020   Procedure: ATRIAL FIBRILLATION ABLATION;  Surgeon: Vickie Epley, MD;  Location: [place] [facility];  Service: Cardiovascular;  Laterality: N/A;   BIOPSY N/A 01/12/2020   Procedure: BIOPSY;  Surgeon: Virgel Manifold, MD;  Location: [place];  Service: Endoscopy;  Laterality: N/A;   CARDIAC CATHETERIZATION N/A 04/17/2016   Procedure: Left Heart Cath and Coronary Angiography;  Surgeon: Wellington Hampshire, MD;  Location: [place] [facility];  Service: Cardiovascular;  Laterality: N/A;   CARDIOVERSION N/A 10/22/2020   Procedure: CARDIOVERSION;  Surgeon: Nelva Bush, MD;  Location: [place] [facility];  Service: Cardiovascular;  Laterality: N/A;   CHOLECYSTECTOMY     COLON SURGERY  2021   COLONOSCOPY N/A 01/20/2020   Procedure: COLONOSCOPY;  Surgeon: Lesly Rubenstein, MD;  Location: [facility];  Service: Endoscopy;  Laterality: N/A;   COLONOSCOPY WITH PROPOFOL N/A 01/12/2020   Procedure: COLONOSCOPY WITH PROPOFOL;  Surgeon: Virgel Manifold, MD;  Location: [place];  Service: Endoscopy;  Laterality: N/A;  Diabetic - oral meds sleep apnea   ELECTROPHYSIOLOGIC STUDY N/A 05/26/2016   Procedure: Cardioversion;  Surgeon: Wellington Hampshire, MD;  Location:  [facility];  Service: Cardiovascular;  Laterality: N/A;   ESOPHAGOGASTRODUODENOSCOPY (EGD) WITH PROPOFOL N/A 01/12/2020   Procedure: ESOPHAGOGASTRODUODENOSCOPY (EGD) WITH PROPOFOL;  Surgeon: Virgel Manifold, MD;  Location: [place];  Service: Endoscopy;  Laterality: N/A;   POLYPECTOMY N/A 01/12/2020   Procedure: POLYPECTOMY;  Surgeon: Virgel Manifold, MD;  Location: [place];  Service: Endoscopy;  Laterality: N/A;   Patient Active Problem List   Diagnosis Date Noted   Pes anserinus bursitis of right knee 04/19/2022   Primary osteoarthritis of right knee 03/27/2022   Greater 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 03/27/2022   It band syndrome,  right 03/27/2022   Hyperlipidemia associated with type 2 diabetes mellitus ([place]) 05/12/2021   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   Varicose veins of leg with pain, bilateral 07/06/2020   Acquired thrombophilia ([place]) 05/24/2020   Type II diabetes mellitus with complication ([place]) 123456   Gastric polyp    Colon polyps    Varicose veins of both lower extremities 10/20/2019   Dysphagia 10/20/2019   Osteoarthritis of right hand 10/31/2016   Nonischemic cardiomyopathy ([place]) 04/14/2016   BMI 33.0-33.9,adult 04/14/2016   PCP: Dr. Halina Maidens  REFERRING PROVIDER: Dr. Rosette Reveal  REFERRING DIAG: M17.11 (ICD-10-CM) - Primary osteoarthritis of right knee, M76.31 (ICD-10-CM) - It band syndrome, right, M25.551 (ICD-10-CM) - Greater 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  RATIONALE FOR EVALUATION AND TREATMENT: Rehabilitation  THERAPY DIAG: Chronic pain of right knee  Pain in right hip  ONSET DATE: August 2023  FOLLOW-UP APPT SCHEDULED WITH REFERRING PROVIDER: Yes   FROM INITIAL EVALUATION SUBJECTIVE:                                                                                                                                                                                         SUBJECTIVE STATEMENT:  R hip and R knee pain  PERTINENT HISTORY:  Pt reports history of chronic R hip pain for decades with recent onset of R knee pain in August 2023. No known trauma but pt thinks she might have aggravated her knee walking across uneven grass. She saw Dr. Zigmund Daniel and eventually had steroid injections in her R greater trochanter, R knee injection, and R pes bursa on 04/18/22. Radiographs (03/14/22) of R knee showed mild degenerative changes at the patellofemoral joint, worse laterally. She receives chiropractic care every 2 weeks for her low back/hip pain and also sees massage therapy once/month.   PAIN:    Pain Intensity: Present: 1/10, Best: 0/10, Worst:  3/10 Pain location: Medial R knee pain Pain Quality: pressure, sharp Radiating: No  Numbness/Tingling: No Focal Weakness: Yes,  R knee feels unstable and feels like it will hyperextend Aggravating factors: Extended standing, standing after extended sitting, squatting,  Relieving factors: resting, ice, elevation, heat, antiinflammatories, using cane and knee brace; 24-hour pain behavior: activity-dependent How long can you stand: 10 minutes History of prior back, hip, knee injury, pain, surgery, or therapy: Yes, history of chronic back and R hip pain. No prior history of orthopedic surgery to back, hip, or knee. Dominant hand: right Imaging: Yes, see history Red flags: Negative for bowel/bladder changes, saddle paresthesia, personal history of cancer, h/o spinal tumors, h/o compression fx, h/o abdominal aneurysm, abdominal pain, chills/fever, night sweats, nausea, vomiting, unrelenting pain, first onset of insidious LBP <19 y/o  PRECAUTIONS: None  WEIGHT BEARING RESTRICTIONS: No  FALLS: Has patient fallen in last 6 months? 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   Hobbies: Pottery (currently not limited but initially had trouble sitting at the wheel), gardening/yard work;  Patient Goals: walking across the yard with improved confidence, lifting heavier objects at home without pain in hip  OBJECTIVE:  Patient Surveys  LEFS 39/80 FOTO 52, predicted improvement to 53  Cognition Patient is oriented to person, place, and time.  Recent memory is intact.  Remote memory is intact.  Attention span and concentration are intact.  Expressive speech is intact.  Patient's fund of knowledge is within normal  limits for educational level.    Gross Musculoskeletal Assessment Tremor: None Bulk: Normal Tone: Normal  GAIT: Full gait assessment deferred however gait is notably antalgic on RLE  Posture: Forward head/rounded shoulders in sitting, full posture assessment deferre;  AROM AROM (Normal range in degrees) AROM    Hip Right Left  Flexion (125) WNL WNL  Extension (15)    Abduction (40)    Adduction     Internal Rotation (45)    External Rotation (45)        Knee    Flexion (135) WNL WNL  Extension (0) WNL WNL      Ankle    Dorsiflexion (20)    Plantarflexion (50)    Inversion (35)    Eversion (15    (* = pain; Blank rows = not tested)  LE MMT: MMT (out of 5) Right  Left   Hip flexion 4 4+  Hip extension 4- 4  Hip abduction 4- 4  Hip adduction 4- 4+  Hip internal rotation 4 5  Hip external rotation 4 5  Knee flexion 4 4+  Knee extension 5 5  Ankle dorsiflexion 4+ 4+  Ankle plantarflexion    Ankle inversion    Ankle eversion    (* = pain; Blank rows = not tested)  Sensation Pt reports decreased sensation in RLE L4-L5 but otherwise intact and symmetrical. Proprioception, and hot/cold testing deferred on this date.  Muscle Length Hamstrings: R: Positive for tightness at 80 degrees L: Positive for tightness at 80 degrees  Quadriceps Pat Patrick): R: Not examined L: Not examined Hip flexors Marcello Moores): R: Not examined L: Not examined IT band Nicoletta Dress): R: Not examined L: Not examined  Palpation Location LEFT  RIGHT           Quadriceps  0  Medial Hamstrings  0  Lateral Hamstrings  0  Lateral Hamstring tendon  0  Medial Hamstring tendon  1  Quadriceps tendon  0  Patella  0  Patellar Tendon  0  Tibial Tuberosity  0  Medial joint line  1  Lateral joint line  0  MCL  1  LCL  0  Adductor Tubercle  1  Pes Anserine tendon  1  Infrapatellar fat pad  0  Fibular head  0  Popliteal fossa  0  (Blank rows = not tested) Graded on 0-4 scale (0 = no pain, 1 = pain, 2 =  pain with wincing/grimacing/flinching, 3 = pain with withdrawal, 4 = unwilling to allow palpation), (Blank rows = not tested)  SPECIAL TESTS  Ligamentous Stability  ACL: Lachman's: R: Negative L: Not examined Anterior Drawer: R: Negative L: Not examined Pivot Shift: R: Negative L: Not examined  PCL: Posterior Drawer: R: Negative L: Not examined Reverse Lachman's: R: Negative L: Not examined Posterior Sag Sign: R: Negative L: Not examined  MCL: Valgus Stress (30 degrees flexion): R: Negative L: Not examined  LCL: Varus Stress (30 degrees flexion): R: Negative L: Not examined  Meniscus Tests McMurray's Test:  Medial Meniscus (Tibial ER): R: Negative L: Not examined Lateral Meniscus (Tibial IR): R: Negative L: Not examined Thessaly: R: Not examined L: Not examined Steinmann Sign I: R: Negative L: Not examined  Patellofemoral Pain Syndrome Patellar Tilt (Lateral): R: Negative L: Not examined Patellar Apprehension: R: Negative L: Not examined Squatting pain: R: Not examined L: Not examined Stair climbing pain: R: Negative L: Not examined Kneeling pain: R: Positive L: Not examined Resisted knee extension pain: R: Negative L: Not examined  Passive Accessory Intervertebral Motion Pt denies reproduction of hip or knee pain with CPA L1-L5 and UPA bilaterally L1-L5. Generally, hypomobile throughout with tenderness along spine.  Special Tests Lumbar Radiculopathy and Discogenic: Centralization and Peripheralization (SN 92, -LR 0.12): Not examined Slump (SN 83, -LR 0.32): R: Negative L: Negative SLR (SN 92, -LR 0.29): R: Negative L:  Negative Crossed SLR (SP 90): R: Negative L: Not examined  Facet Joint: Extension-Rotation (SN 100, -LR 0.0): R: Negative L: Negative  Lumbar Foraminal Stenosis: Lumbar quadrant (SN 70): R: Negative L: Negative  Hip: FABER (SN 81): R: Positive L: Negative FADIR (SN 94): R: Positive L: Negative Hip scour (SN 50): R: Negative L: Negative Figure  4: R: Negative L: Positive for lateral L hip pain  SIJ:  Thigh Thrust (SN 88, -LR 0.18) : R: Not examined L: Not examined  Piriformis Syndrome: FAIR Test (SN 88, SP 83): R: Not examined L: Not examined  Patellar Tendinopathy Inferior pole palpation with anterior tilt: R: Negative L: Not examined     TODAY'S TREATMENT    SUBJECTIVE: Reports she is doing well today. Arrives with a L knee brace and states that the L knee has been considerably more irritated recently. No resting R hip or bilateral knee pain upon arrival however pt reporting significant pain with L knee flexion. No specific questions currently.   PAIN: Denies resting pain but reports considerable L knee pain with flexion;    Ther-ex  NuStep L1-3 x 6 minutes BLE/BUE for strengthening and warm-up while obtaining interval history and therapist adjusting resistance to alter difficulty;  Total Gym (TG) Level 15 (L15) R single leg squats 2 x 10; TG L15 double leg heel raises 2 x 20;  Updated outcome measures: LEFS: 58/80 FOTO: 59 R hip abduction strength: 4/5, Dynamometer: 17#, 21.5#, 20# (19.5#); Worst R pain with  squatting: 2/10;  Supine SLR 4# ankle weight (AW) RLE 2 x 10, 3# AW LLE 2 x 10;  Supine R heel slide with manually resisted extension using bodyweight x 10; Hooklying clams with manual resistance from therapist 2 x 10; Hooklying adductor squeezes with manual resistnace from therapist 2 x 10; L sidelying R hip abduction with 3# AW 2 x 20; L sidelying R hip reverse clams with 3# AW x 10, x 16;   Not performed: Forward BOSU (round side up) step-ups x 10 with each LE; Hooklying bridges 2 x 10; Seated LAQ with 5# ankle weights (AW) 2 x 15 BLE; Standing exercises with 5# ankle weights (AW)  BLE; Hamstring curls x 15 BLE; Hip abduction x 15 BLE; Hamstring curls x 15 BLE; Hip extension x 15 BLE; Nautilus resisted gait with 95# forward, backward, R lateral, and L lateral x 5 each direction;  Forward BOSU  lunges leading with RLE x 10; R lateral BOSU lunges x 10;   PATIENT EDUCATION:  Education details: Pt educated throughout session about proper posture and technique with exercises. Improved exercise technique, movement at target joints, use of target muscles after min to mod verbal, visual, tactile cues. Educated about progress with therapy and outcome measures; Person educated: Patient Education method: Explanation Education comprehension: verbalized understanding and returned demonstration   HOME EXERCISE PROGRAM:  Access Code: [email or web address] URL: [email or web address] Date: 06/02/2022 Prepared by: Roxana Hires  Exercises - Supine Quad Set (Mirrored)  - 1 x daily - 7 x weekly - 2 sets - 10 reps - 5s hold - Supine Bridge  - 1 x daily - 7 x weekly - 2 sets - 10 reps - 2s hold - Supine March with Resistance Band  - 1 x daily - 7 x weekly - 2 sets - 10 reps - 2s hold - Seated Hip Adduction Isometrics with Ball  - 1 x daily - 7 x weekly - 2 sets - 10 reps - 2s hold - Seated Hip Abduction with Resistance  - 1 x daily - 7 x weekly - 2 sets - 10 reps - 2s hold - Standing Hip Abduction with Resistance at Ankles and Counter Support  - 1 x daily - 7 x weekly - 2 sets - 10 reps - 2s hold - Standing Hip Extension with Resistance at Counter Support  - 1 x daily - 7 x weekly - 2 sets - 10 reps - 2 hold - Mini Squat with Counter Support  - 1 x daily - 7 x weekly - 2 sets - 10 reps   GOALS: Goals reviewed with patient? Yes  SHORT TERM GOALS: Target date: 06/08/2022  Pt will be independent with HEP to improve strength and decrease knee pain to improve pain-free function at home and work. Baseline:  Goal status: ONGOING   LONG TERM GOALS: Target date: 07/06/2022  Pt will increase FOTO to at least 61 to demonstrate significant improvement in function at home and work related to knee pain  Baseline: 05/11/22: 52; 06/20/22: 55; 07/07/22: 59; Goal status: PARTIALLY MET  2.  Pt will be able  to bend and squat without an increase in her R knee pain in order to resume working in her yard/garden with less pain Baseline: 05/11/22: 3/10 R knee/hip pain; 06/20/22: 3/10 R knee/hip pain; 07/07/22: 2/10 "pulling," squatting ability is much better per pt; Goal status: PARTIALLY MET  3.  Pt will increase LEFS score by at least 9 points in order demonstrate clinically significant reduction  in knee pain/disability.       Baseline: 05/11/22: 39/80; 06/20/22: 39/80; 07/07/22: 58/80 Goal status: ACHIEVED  4.  Pt will increase strength of R hip abduction to at least 4+/5 MMT grade in order to demonstrate improvement in strength and function and improved stability at right knee . Baseline: 05/11/22: 4-/5; 06/20/22: 4/5 (15# on dynamometer); 07/07/22: 4/5 (19.5#),   Goal status: PARTIALLY MET   ASSESSMENT:  CLINICAL IMPRESSION: Updated outcome measures during visit today. Her worst R knee pain with squatting has decreased to 2/10 with only a "pulling sensation" and she reports much more ease with squatting. Her LEFS improved considerably from 39/80 at her initial evaluation to 58/80 today. Patient's FOTO improved from 73 to 69. Her R hip abduction strength increased from 4-/5 initially to 4/5 on 06/20/22 (15#) and 4/5 today (19.5#). Pt demonstrates excellent motivation and effort during session today. Progressed strengthening during session today and she denies any increase in pain. Focused on limited WB positions today to decrease L knee pain. No updates to HEP during session today but will consider at future sessions. Pt encouraged to follow-up with MD regarding her acute on chronic L knee pain. Therapy frequency decreased to 1x/wk. She will benefit from PT services to address deficits in strength and range of motion in order to return to full function at home with less pain.   OBJECTIVE IMPAIRMENTS: Abnormal gait, decreased strength, and pain.   ACTIVITY LIMITATIONS: lifting and squatting  PARTICIPATION  LIMITATIONS: cleaning, shopping, community activity, occupation, and yard work  PERSONAL FACTORS: Age, Time since onset of injury/illness/exacerbation, and 3+ comorbidities: DM, obesity, OSA, and afib  are also affecting patient's functional outcome.   REHAB POTENTIAL: Fair    CLINICAL DECISION MAKING: Unstable/unpredictable  EVALUATION COMPLEXITY: High   PLAN: PT FREQUENCY: 1x/week  PT DURATION: 8 weeks  PLANNED INTERVENTIONS: Therapeutic exercises, Therapeutic activity, Neuromuscular re-education, Balance training, Gait training, Patient/Family education, Self Care, Joint mobilization, Joint manipulation, Vestibular training, Canalith repositioning, Orthotic/Fit training, DME instructions, Dry Needling, Electrical stimulation, Spinal manipulation, Spinal mobilization, Cryotherapy, Moist heat, Taping, Traction, Ultrasound, Ionotophoresis '4mg'$ /ml Dexamethasone, Manual therapy, and Re-evaluation.  PLAN FOR NEXT SESSION:  progress strengthening for RLE, review/modify HEP as needed;   Lyndel Safe Gianluca Chhim PT, DPT, GCS  Ranette Luckadoo, PT 07/07/2022, 9:46 AM

## 2022-07-14 ENCOUNTER — Ambulatory Visit: Payer: Medicare Other | Attending: Family Medicine

## 2022-07-14 DIAGNOSIS — M25561 Pain in right knee: Secondary | ICD-10-CM | POA: Diagnosis not present

## 2022-07-14 DIAGNOSIS — G8929 Other chronic pain: Secondary | ICD-10-CM | POA: Diagnosis not present

## 2022-07-14 DIAGNOSIS — M25551 Pain in right hip: Secondary | ICD-10-CM | POA: Diagnosis not present

## 2022-07-14 DIAGNOSIS — M25562 Pain in left knee: Secondary | ICD-10-CM | POA: Diagnosis not present

## 2022-07-14 NOTE — Therapy (Signed)
OUTPATIENT PHYSICAL THERAPY KNEE TREATMENT  Patient Name: Brandy [name] MRN: [id number] DOB:26-Sep-1955, 67 y.o., female Today's Date: 07/14/2022  END OF SESSION:  PT End of Session - 07/14/22 0941     Visit Number 16    Number of Visits 25    Date for PT Re-Evaluation 08/04/22    Authorization Type [employer] [facility]    Authorization Time Period eval: 05/11/22    PT Start Time 0931    PT Stop Time 1015    PT Time Calculation (min) 44 min    Activity Tolerance Patient tolerated treatment well    Behavior During Therapy [facility] for tasks assessed/performed            Past Medical History:  Diagnosis Date   Acute lower GI bleeding 01/19/2020   Arthritis    Atrial fibrillation with rapid ventricular response (HCC)    Chicken pox    Chronic systolic CHF (congestive heart failure) ([place])    a. 03/2016 Echo: Ef 15-20%, diff HK, ant AK;  b. 05/2016 Echo: Ef 30-35%, diff HK, mildly dil LA/RA.   Diabetes mellitus without complication (HCC)    Dysrhythmia    GERD (gastroesophageal reflux disease)    Heart murmur    Hip discomfort    been going on for 40 years    History of kidney stones    Hyperlipidemia associated with type 2 diabetes mellitus ([place]) 05/12/2021   Hypertension    Moderate mitral regurgitation    a. 03/2016 Echo: mod MR in setting of LV dysfxn.   Motion sickness    car - back seat   NICM (nonischemic cardiomyopathy) ([place])    a. 03/2016 Echo: EF 15-20%, diff HK, ant AK, mod MR, mod dil LA, mildly dil RA;  b. 04/2016 Cath: nl cors;  c. 05/2016 Echo: EF 30-35%, diff HK.   Obesity    Obstructive sleep apnea    compliant with CPAP   Persistent atrial fibrillation ([place])    a. Dx 03/2016;  b. CHA2DS2VASc = 2-->Eliquis '5mg'$  BID;  b. 05/2016 Failed DCCV x 4.   Sleep apnea    Stomach irritation    Visit for monitoring Tikosyn therapy 07/24/2016   Past Surgical History:  Procedure Laterality Date   ATRIAL FIBRILLATION ABLATION N/A 12/09/2020   Procedure: ATRIAL  FIBRILLATION ABLATION;  Surgeon: Vickie Epley, MD;  Location: [place] [facility];  Service: Cardiovascular;  Laterality: N/A;   BIOPSY N/A 01/12/2020   Procedure: BIOPSY;  Surgeon: Virgel Manifold, MD;  Location: [place];  Service: Endoscopy;  Laterality: N/A;   CARDIAC CATHETERIZATION N/A 04/17/2016   Procedure: Left Heart Cath and Coronary Angiography;  Surgeon: Wellington Hampshire, MD;  Location: [place] [facility];  Service: Cardiovascular;  Laterality: N/A;   CARDIOVERSION N/A 10/22/2020   Procedure: CARDIOVERSION;  Surgeon: Nelva Bush, MD;  Location: [place] [facility];  Service: Cardiovascular;  Laterality: N/A;   CHOLECYSTECTOMY     COLON SURGERY  2021   COLONOSCOPY N/A 01/20/2020   Procedure: COLONOSCOPY;  Surgeon: Lesly Rubenstein, MD;  Location: [facility];  Service: Endoscopy;  Laterality: N/A;   COLONOSCOPY WITH PROPOFOL N/A 01/12/2020   Procedure: COLONOSCOPY WITH PROPOFOL;  Surgeon: Virgel Manifold, MD;  Location: [place];  Service: Endoscopy;  Laterality: N/A;  Diabetic - oral meds sleep apnea   ELECTROPHYSIOLOGIC STUDY N/A 05/26/2016   Procedure: Cardioversion;  Surgeon: Wellington Hampshire, MD;  Location: [facility];  Service: Cardiovascular;  Laterality: N/A;  ESOPHAGOGASTRODUODENOSCOPY (EGD) WITH PROPOFOL N/A 01/12/2020   Procedure: ESOPHAGOGASTRODUODENOSCOPY (EGD) WITH PROPOFOL;  Surgeon: Virgel Manifold, MD;  Location: [place];  Service: Endoscopy;  Laterality: N/A;   POLYPECTOMY N/A 01/12/2020   Procedure: POLYPECTOMY;  Surgeon: Virgel Manifold, MD;  Location: [place];  Service: Endoscopy;  Laterality: N/A;   Patient Active Problem List   Diagnosis Date Noted   Pes anserinus bursitis of right knee 04/19/2022   Primary osteoarthritis of right knee 03/27/2022   Greater 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 03/27/2022   It band syndrome, right 03/27/2022   Hyperlipidemia associated with type 2  diabetes mellitus ([place]) 05/12/2021   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 Halina Maidens  REFERRING PROVIDER: Dr. Rosette Reveal  REFERRING DIAG: M17.11 (ICD-10-CM) - Primary osteoarthritis of right knee, M76.31 (ICD-10-CM) - It band syndrome, right, M25.551 (ICD-10-CM) - Greater 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  RATIONALE FOR EVALUATION AND TREATMENT: Rehabilitation  THERAPY DIAG: Chronic pain of right knee  Pain in right hip  ONSET DATE: August 2023  FOLLOW-UP APPT SCHEDULED WITH REFERRING PROVIDER: Yes   FROM INITIAL EVALUATION SUBJECTIVE:                                                                                                                                                                                         SUBJECTIVE STATEMENT:  R hip and R knee pain  PERTINENT HISTORY:  Pt reports history of chronic R hip pain for decades with recent onset of R knee pain in August 2023. No known trauma but pt thinks she might have aggravated her knee walking across uneven grass. She saw Dr. Zigmund Daniel and eventually had steroid injections in her R greater trochanter, R knee injection, and R pes bursa on 04/18/22. Radiographs (03/14/22) of R knee showed mild degenerative changes at the patellofemoral joint, worse laterally. She receives chiropractic care every 2 weeks for her low back/hip pain and also sees massage therapy once/month.   PAIN:    Pain Intensity: Present: 1/10, Best: 0/10, Worst: 3/10 Pain location: Medial R knee pain Pain Quality:  pressure, sharp Radiating: No  Numbness/Tingling: No Focal Weakness: Yes, R knee feels unstable and feels like it will hyperextend  Aggravating factors: Extended standing, standing after extended sitting, squatting,  Relieving factors: resting, ice, elevation, heat, antiinflammatories, using cane and knee brace; 24-hour pain behavior: activity-dependent How long can you stand: 10 minutes History of prior back, hip, knee injury, pain, surgery, or therapy: Yes, history of chronic back and R hip pain. No prior history of orthopedic surgery to back, hip, or knee. Dominant hand: right Imaging: Yes, see history Red flags: Negative for bowel/bladder changes, saddle paresthesia, personal history of cancer, h/o spinal tumors, h/o compression fx, h/o abdominal aneurysm, abdominal pain, chills/fever, night sweats, nausea, vomiting, unrelenting pain, first onset of insidious LBP <20 y/o  PRECAUTIONS: None  WEIGHT BEARING RESTRICTIONS: No  FALLS: Has patient fallen in last 6 months? 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   Hobbies: Pottery (currently not limited but initially had trouble sitting at the wheel), gardening/yard work;  Patient Goals: walking across the yard with improved confidence, lifting heavier objects at home without pain in hip  OBJECTIVE:  Patient Surveys  LEFS 39/80 FOTO 52, predicted improvement to 61  GAIT: Full gait assessment deferred however gait is notably antalgic on RLE  AROM AROM (Normal range in degrees) AROM    Hip Right Left  Flexion (125) WNL WNL  Extension (15)    Abduction (40)    Adduction     Internal Rotation (45)    External Rotation (45)        Knee     Flexion (135) WNL WNL  Extension (0) WNL WNL      Ankle    Dorsiflexion (20)    Plantarflexion (50)    Inversion (35)    Eversion (15    (* = pain; Blank rows = not tested)  LE MMT: MMT (out of 5) Right  Left   Hip flexion 4 4+  Hip extension 4- 4  Hip abduction 4- 4  Hip adduction 4- 4+  Hip internal rotation 4 5  Hip external rotation 4 5  Knee flexion 4 4+  Knee extension 5 5  Ankle dorsiflexion 4+ 4+  Ankle plantarflexion    Ankle inversion    Ankle eversion    (* = pain; Blank rows = not tested)  Sensation Pt reports decreased sensation in RLE L4-L5 but otherwise intact and symmetrical. Proprioception, and hot/cold testing deferred on this date.  Muscle Length Hamstrings: R: Positive for tightness at 80 degrees L: Positive for tightness at 80 degrees   Palpation Location LEFT  RIGHT           Quadriceps  0  Medial Hamstrings  0  Lateral Hamstrings  0  Lateral Hamstring tendon  0  Medial Hamstring tendon  1  Quadriceps tendon  0  Patella  0  Patellar Tendon  0  Tibial Tuberosity  0  Medial joint line  1  Lateral joint line  0  MCL  1  LCL  0  Adductor Tubercle  1  Pes Anserine tendon  1  Infrapatellar fat pad  0  Fibular head  0  Popliteal fossa  0  (Blank rows = not tested) Graded on 0-4 scale (0 = no pain, 1 = pain, 2 = pain with wincing/grimacing/flinching, 3 = pain  with withdrawal, 4 = unwilling to allow palpation), (Blank rows = not tested)  SPECIAL TESTS  Ligamentous Stability  ACL: Lachman's: R: Negative L: Not examined Anterior Drawer: R: Negative L: Not examined Pivot Shift: R: Negative L: Not examined  PCL: Posterior Drawer: R: Negative L: Not examined Reverse Lachman's: R: Negative L: Not examined Posterior Sag Sign: R: Negative L: Not examined  MCL: Valgus Stress (30 degrees flexion): R: Negative L: Not examined  LCL: Varus Stress (30 degrees flexion): R: Negative L: Not examined  Meniscus Tests McMurray's Test:  Medial  Meniscus (Tibial ER): R: Negative L: Not examined Lateral Meniscus (Tibial IR): R: Negative L: Not examined Thessaly: R: Not examined L: Not examined Steinmann Sign I: R: Negative L: Not examined  Patellofemoral Pain Syndrome Patellar Tilt (Lateral): R: Negative L: Not examined Patellar Apprehension: R: Negative L: Not examined Squatting pain: R: Not examined L: Not examined Stair climbing pain: R: Negative L: Not examined Kneeling pain: R: Positive L: Not examined Resisted knee extension pain: R: Negative L: Not examined  Passive Accessory Intervertebral Motion Pt denies reproduction of hip or knee pain with CPA L1-L5 and UPA bilaterally L1-L5. Generally, hypomobile throughout with tenderness along spine.  Special Tests Lumbar Radiculopathy and Discogenic: Centralization and Peripheralization (SN 92, -LR 0.12): Not examined Slump (SN 83, -LR 0.32): R: Negative L: Negative SLR (SN 92, -LR 0.29): R: Negative L:  Negative Crossed SLR (SP 90): R: Negative L: Not examined  Facet Joint: Extension-Rotation (SN 100, -LR 0.0): R: Negative L: Negative  Lumbar Foraminal Stenosis: Lumbar quadrant (SN 70): R: Negative L: Negative  Hip: FABER (SN 81): R: Positive L: Negative FADIR (SN 94): R: Positive L: Negative Hip scour (SN 50): R: Negative L: Negative Figure 4: R: Negative L: Positive for lateral L hip pain  SIJ:  Thigh Thrust (SN 88, -LR 0.18) : R: Not examined L: Not examined  Piriformis Syndrome: FAIR Test (SN 88, SP 83): R: Not examined L: Not examined  Patellar Tendinopathy Inferior pole palpation with anterior tilt: R: Negative L: Not examined     TODAY'S TREATMENT    SUBJECTIVE: Reports she is doing well today. Significant L knee soreness after the last therapy session resulting in the need to wear a brace for a couple days afterwards. She thinks that she might have had a pinched nerve in her L hip and states with some soft tissue work of the hip noticed significant  improvement in the L knee pain. No resting R hip or bilateral knee pain upon arrival.   PAIN: Denies resting hip or knee pain    Ther-ex  NuStep L1-3 x 6 minutes BLE/BUE for strengthening and warm-up while obtaining interval history;  Total Gym (TG) Level 22 (L22) double single leg squats 2 x 10; TG L22 double leg heel raises 2 x 20;  Supine SLR 3# ankle weight (AW) RLE 2 x 10,   L sidelying R hip abduction with 3# AW 2 x 10; L sidelying R hip reverse clams 2 x 10; L sidelying R clams with manual resistance from therapist 2 x 10; Seated R LAQ with 3# AW 2 x 10; Seated R hamstring curls with green tband 2 x 10; 6" forward step-ups leading with RLE x 15, a few reps performed on the LLE as well; 6" R lateral step-ups x 15, a few reps performed with the LLE as well;   Not performed: Forward BOSU (round side up) step-ups x 10 with each LE; Hooklying bridges  2 x 10; Standing exercises with 5# ankle weights (AW)  BLE; Hamstring curls x 15 BLE; Hip abduction x 15 BLE; Hamstring curls x 15 BLE; Hip extension x 15 BLE; Nautilus resisted gait with 95# forward, backward, R lateral, and L lateral x 5 each direction;  Forward BOSU lunges leading with RLE x 10; R lateral BOSU lunges x 10;   PATIENT EDUCATION:  Education details: Pt educated throughout session about proper posture and technique with exercises. Improved exercise technique, movement at target joints, use of target muscles after min to mod verbal, visual, tactile cues.  Person educated: Patient Education method: Explanation Education comprehension: verbalized understanding and returned demonstration   HOME EXERCISE PROGRAM:  Access Code: [email or web address] URL: [email or web address] Date: 06/02/2022 Prepared by: Roxana Hires  Exercises - Supine Quad Set (Mirrored)  - 1 x daily - 7 x weekly - 2 sets - 10 reps - 5s hold - Supine Bridge  - 1 x daily - 7 x weekly - 2 sets - 10 reps - 2s hold - Supine March with  Resistance Band  - 1 x daily - 7 x weekly - 2 sets - 10 reps - 2s hold - Seated Hip Adduction Isometrics with Ball  - 1 x daily - 7 x weekly - 2 sets - 10 reps - 2s hold - Seated Hip Abduction with Resistance  - 1 x daily - 7 x weekly - 2 sets - 10 reps - 2s hold - Standing Hip Abduction with Resistance at Ankles and Counter Support  - 1 x daily - 7 x weekly - 2 sets - 10 reps - 2s hold - Standing Hip Extension with Resistance at Counter Support  - 1 x daily - 7 x weekly - 2 sets - 10 reps - 2 hold - Mini Squat with Counter Support  - 1 x daily - 7 x weekly - 2 sets - 10 reps   GOALS: Goals reviewed with patient? Yes  SHORT TERM GOALS: Target date: 06/08/2022  Pt will be independent with HEP to improve strength and decrease knee pain to improve pain-free function at home and work. Baseline:  Goal status: ONGOING   LONG TERM GOALS: Target date: 07/06/2022  Pt will increase FOTO to at least 61 to demonstrate significant improvement in function at home and work related to knee pain  Baseline: 05/11/22: 52; 06/20/22: 55; 07/07/22: 59; Goal status: PARTIALLY MET  2.  Pt will be able to bend and squat without an increase in her R knee pain in order to resume working in her yard/garden with less pain Baseline: 05/11/22: 3/10 R knee/hip pain; 06/20/22: 3/10 R knee/hip pain; 07/07/22: 2/10 "pulling," squatting ability is much better per pt; Goal status: PARTIALLY MET  3.  Pt will increase LEFS score by at least 9 points in order demonstrate clinically significant reduction in knee pain/disability.       Baseline: 05/11/22: 39/80; 06/20/22: 39/80; 07/07/22: 58/80 Goal status: ACHIEVED  4.  Pt will increase strength of R hip abduction to at least 4+/5 MMT grade in order to demonstrate improvement in strength and function and improved stability at right knee . Baseline: 05/11/22: 4-/5; 06/20/22: 4/5 (15# on dynamometer); 07/07/22: 4/5 (19.5#),   Goal status: PARTIALLY MET   ASSESSMENT:  CLINICAL  IMPRESSION: Continued strengthening during session today however most of the exercises performed in limited WB positions today in order to decrease L knee pain. No updates to HEP during session today but will consider at future sessions. Therapy  frequency will be maintained at 1x/wk. She will benefit from PT services to address deficits in strength and range of motion in order to return to full function at home with less pain.   OBJECTIVE IMPAIRMENTS: Abnormal gait, decreased strength, and pain.   ACTIVITY LIMITATIONS: lifting and squatting  PARTICIPATION LIMITATIONS: cleaning, shopping, community activity, occupation, and yard work  PERSONAL FACTORS: Age, Time since onset of injury/illness/exacerbation, and 3+ comorbidities: DM, obesity, OSA, and afib  are also affecting patient's functional outcome.   REHAB POTENTIAL: Fair    CLINICAL DECISION MAKING: Unstable/unpredictable  EVALUATION COMPLEXITY: High   PLAN: PT FREQUENCY: 1x/week  PT DURATION: 8 weeks  PLANNED INTERVENTIONS: Therapeutic exercises, Therapeutic activity, Neuromuscular re-education, Balance training, Gait training, Patient/Family education, Self Care, Joint mobilization, Joint manipulation, Vestibular training, Canalith repositioning, Orthotic/Fit training, DME instructions, Dry Needling, Electrical stimulation, Spinal manipulation, Spinal mobilization, Cryotherapy, Moist heat, Taping, Traction, Ultrasound, Ionotophoresis '4mg'$ /ml Dexamethasone, Manual therapy, and Re-evaluation.  PLAN FOR NEXT SESSION:  progress strengthening for RLE, review/modify HEP as needed;   Phillips Grout PT, DPT, GCS  Brandy [name], PT 07/14/2022, 12:33 PM

## 2022-07-19 ENCOUNTER — Ambulatory Visit: Payer: Medicare Other | Admitting: Internal Medicine

## 2022-07-20 NOTE — Therapy (Signed)
OUTPATIENT PHYSICAL THERAPY KNEE TREATMENT  Patient Name: Brandy [name] MRN: [id number] DOB:[birth year], 67 y.o., female Today's Date: 07/22/2022  END OF SESSION:  PT End of Session - 07/21/22 0851     Visit Number 17    Number of Visits 25    Date for PT Re-Evaluation 08/04/22    Authorization Type [employer] [facility]    Authorization Time Period eval: 05/11/22    PT Start Time 0855    PT Stop Time 0940    PT Time Calculation (min) 45 min    Activity Tolerance Patient tolerated treatment well    Behavior During Therapy [facility] for tasks assessed/performed            Past Medical History:  Diagnosis Date   Acute lower GI bleeding 01/19/2020   Arthritis    Atrial fibrillation with rapid ventricular response (HCC)    Chicken pox    Chronic systolic CHF (congestive heart failure) ([place])    a. 03/2016 Echo: Ef 15-20%, diff HK, ant AK;  b. 05/2016 Echo: Ef 30-35%, diff HK, mildly dil LA/RA.   Diabetes mellitus without complication (HCC)    Dysrhythmia    GERD (gastroesophageal reflux disease)    Heart murmur    Hip discomfort    been going on for 40 years    History of kidney stones    Hyperlipidemia associated with type 2 diabetes mellitus ([place]) 05/12/2021   Hypertension    Moderate mitral regurgitation    a. 03/2016 Echo: mod MR in setting of LV dysfxn.   Motion sickness    car - back seat   NICM (nonischemic cardiomyopathy) ([place])    a. 03/2016 Echo: EF 15-20%, diff HK, ant AK, mod MR, mod dil LA, mildly dil RA;  b. 04/2016 Cath: nl cors;  c. 05/2016 Echo: EF 30-35%, diff HK.   Obesity    Obstructive sleep apnea    compliant with CPAP   Persistent atrial fibrillation ([place])    a. Dx 03/2016;  b. CHA2DS2VASc = 2-->Eliquis '5mg'$  BID;  b. 05/2016 Failed DCCV x 4.   Sleep apnea    Stomach irritation    Visit for monitoring Tikosyn therapy 07/24/2016   Past Surgical History:  Procedure Laterality Date   ATRIAL FIBRILLATION ABLATION N/A 12/09/2020   Procedure: ATRIAL  FIBRILLATION ABLATION;  Surgeon: Vickie Epley, MD;  Location: [place] [facility];  Service: Cardiovascular;  Laterality: N/A;   BIOPSY N/A 01/12/2020   Procedure: BIOPSY;  Surgeon: Virgel Manifold, MD;  Location: [place];  Service: Endoscopy;  Laterality: N/A;   CARDIAC CATHETERIZATION N/A 04/17/2016   Procedure: Left Heart Cath and Coronary Angiography;  Surgeon: Wellington Hampshire, MD;  Location: [place] [facility];  Service: Cardiovascular;  Laterality: N/A;   CARDIOVERSION N/A 10/22/2020   Procedure: CARDIOVERSION;  Surgeon: Nelva Bush, MD;  Location: [place] [facility];  Service: Cardiovascular;  Laterality: N/A;   CHOLECYSTECTOMY     COLON SURGERY  2021   COLONOSCOPY N/A 01/20/2020   Procedure: COLONOSCOPY;  Surgeon: Lesly Rubenstein, MD;  Location: [facility];  Service: Endoscopy;  Laterality: N/A;   COLONOSCOPY WITH PROPOFOL N/A 01/12/2020   Procedure: COLONOSCOPY WITH PROPOFOL;  Surgeon: Virgel Manifold, MD;  Location: [place];  Service: Endoscopy;  Laterality: N/A;  Diabetic - oral meds sleep apnea   ELECTROPHYSIOLOGIC STUDY N/A 05/26/2016   Procedure: Cardioversion;  Surgeon: Wellington Hampshire, MD;  Location: [facility];  Service: Cardiovascular;  Laterality: N/A;  ESOPHAGOGASTRODUODENOSCOPY (EGD) WITH PROPOFOL N/A 01/12/2020   Procedure: ESOPHAGOGASTRODUODENOSCOPY (EGD) WITH PROPOFOL;  Surgeon: Virgel Manifold, MD;  Location: [place];  Service: Endoscopy;  Laterality: N/A;   POLYPECTOMY N/A 01/12/2020   Procedure: POLYPECTOMY;  Surgeon: Virgel Manifold, MD;  Location: [place];  Service: Endoscopy;  Laterality: N/A;   Patient Active Problem List   Diagnosis Date Noted   Pes anserinus bursitis of right knee 04/19/2022   Primary osteoarthritis of right knee 03/27/2022   Greater 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 03/27/2022   It band syndrome, right 03/27/2022   Hyperlipidemia associated with type 2  diabetes mellitus ([place]) 05/12/2021   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 Halina Maidens  REFERRING PROVIDER: Dr. Rosette Reveal  REFERRING DIAG: M17.11 (ICD-10-CM) - Primary osteoarthritis of right knee, M76.31 (ICD-10-CM) - It band syndrome, right, M25.551 (ICD-10-CM) - Greater 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  RATIONALE FOR EVALUATION AND TREATMENT: Rehabilitation  THERAPY DIAG: Chronic pain of right knee  Pain in right hip  ONSET DATE: August 2023  FOLLOW-UP APPT SCHEDULED WITH REFERRING PROVIDER: Yes   FROM INITIAL EVALUATION SUBJECTIVE:                                                                                                                                                                                         SUBJECTIVE STATEMENT:  R hip and R knee pain  PERTINENT HISTORY:  Pt reports history of chronic R hip pain for decades with recent onset of R knee pain in August 2023. No known trauma but pt thinks she might have aggravated her knee walking across uneven grass. She saw Dr. Zigmund Daniel and eventually had steroid injections in her R greater trochanter, R knee injection, and R pes bursa on 04/18/22. Radiographs (03/14/22) of R knee showed mild degenerative changes at the patellofemoral joint, worse laterally. She receives chiropractic care every 2 weeks for her low back/hip pain and also sees massage therapy once/month.   PAIN:    Pain Intensity: Present: 1/10, Best: 0/10, Worst: 3/10 Pain location: Medial R knee pain Pain Quality:  pressure, sharp Radiating: No  Numbness/Tingling: No Focal Weakness: Yes, R knee feels unstable and feels like it will hyperextend  Aggravating factors: Extended standing, standing after extended sitting, squatting,  Relieving factors: resting, ice, elevation, heat, antiinflammatories, using cane and knee brace; 24-hour pain behavior: activity-dependent How long can you stand: 10 minutes History of prior back, hip, knee injury, pain, surgery, or therapy: Yes, history of chronic back and R hip pain. No prior history of orthopedic surgery to back, hip, or knee. Dominant hand: right Imaging: Yes, see history Red flags: Negative for bowel/bladder changes, saddle paresthesia, personal history of cancer, h/o spinal tumors, h/o compression fx, h/o abdominal aneurysm, abdominal pain, chills/fever, night sweats, nausea, vomiting, unrelenting pain, first onset of insidious LBP <19 y/o  PRECAUTIONS: None  WEIGHT BEARING RESTRICTIONS: No  FALLS: Has patient fallen in last 6 months? 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   Hobbies: Pottery (currently not limited but initially had trouble sitting at the wheel), gardening/yard work;  Patient Goals: walking across the yard with improved confidence, lifting heavier objects at home without pain in hip  OBJECTIVE:  Patient Surveys  LEFS 39/80 FOTO 52, predicted improvement to 61  GAIT: Full gait assessment deferred however gait is notably antalgic on RLE  AROM AROM (Normal range in degrees) AROM    Hip Right Left  Flexion (125) WNL WNL  Extension (15)    Abduction (40)    Adduction     Internal Rotation (45)    External Rotation (45)        Knee     Flexion (135) WNL WNL  Extension (0) WNL WNL      Ankle    Dorsiflexion (20)    Plantarflexion (50)    Inversion (35)    Eversion (15    (* = pain; Blank rows = not tested)  LE MMT: MMT (out of 5) Right  Left   Hip flexion 4 4+  Hip extension 4- 4  Hip abduction 4- 4  Hip adduction 4- 4+  Hip internal rotation 4 5  Hip external rotation 4 5  Knee flexion 4 4+  Knee extension 5 5  Ankle dorsiflexion 4+ 4+  Ankle plantarflexion    Ankle inversion    Ankle eversion    (* = pain; Blank rows = not tested)  Sensation Pt reports decreased sensation in RLE L4-L5 but otherwise intact and symmetrical. Proprioception, and hot/cold testing deferred on this date.  Muscle Length Hamstrings: R: Positive for tightness at 80 degrees L: Positive for tightness at 80 degrees   Palpation Location LEFT  RIGHT           Quadriceps  0  Medial Hamstrings  0  Lateral Hamstrings  0  Lateral Hamstring tendon  0  Medial Hamstring tendon  1  Quadriceps tendon  0  Patella  0  Patellar Tendon  0  Tibial Tuberosity  0  Medial joint line  1  Lateral joint line  0  MCL  1  LCL  0  Adductor Tubercle  1  Pes Anserine tendon  1  Infrapatellar fat pad  0  Fibular head  0  Popliteal fossa  0  (Blank rows = not tested) Graded on 0-4 scale (0 = no pain, 1 = pain, 2 = pain with wincing/grimacing/flinching, 3 = pain  with withdrawal, 4 = unwilling to allow palpation), (Blank rows = not tested)  SPECIAL TESTS  Ligamentous Stability  ACL: Lachman's: R: Negative L: Not examined Anterior Drawer: R: Negative L: Not examined Pivot Shift: R: Negative L: Not examined  PCL: Posterior Drawer: R: Negative L: Not examined Reverse Lachman's: R: Negative L: Not examined Posterior Sag Sign: R: Negative L: Not examined  MCL: Valgus Stress (30 degrees flexion): R: Negative L: Not examined  LCL: Varus Stress (30 degrees flexion): R: Negative L: Not examined  Meniscus Tests McMurray's Test:  Medial  Meniscus (Tibial ER): R: Negative L: Not examined Lateral Meniscus (Tibial IR): R: Negative L: Not examined Thessaly: R: Not examined L: Not examined Steinmann Sign I: R: Negative L: Not examined  Patellofemoral Pain Syndrome Patellar Tilt (Lateral): R: Negative L: Not examined Patellar Apprehension: R: Negative L: Not examined Squatting pain: R: Not examined L: Not examined Stair climbing pain: R: Negative L: Not examined Kneeling pain: R: Positive L: Not examined Resisted knee extension pain: R: Negative L: Not examined  Passive Accessory Intervertebral Motion Pt denies reproduction of hip or knee pain with CPA L1-L5 and UPA bilaterally L1-L5. Generally, hypomobile throughout with tenderness along spine.  Special Tests Lumbar Radiculopathy and Discogenic: Centralization and Peripheralization (SN 92, -LR 0.12): Not examined Slump (SN 83, -LR 0.32): R: Negative L: Negative SLR (SN 92, -LR 0.29): R: Negative L:  Negative Crossed SLR (SP 90): R: Negative L: Not examined  Facet Joint: Extension-Rotation (SN 100, -LR 0.0): R: Negative L: Negative  Lumbar Foraminal Stenosis: Lumbar quadrant (SN 70): R: Negative L: Negative  Hip: FABER (SN 81): R: Positive L: Negative FADIR (SN 94): R: Positive L: Negative Hip scour (SN 50): R: Negative L: Negative Figure 4: R: Negative L: Positive for lateral L hip pain  SIJ:  Thigh Thrust (SN 88, -LR 0.18) : R: Not examined L: Not examined  Piriformis Syndrome: FAIR Test (SN 88, SP 83): R: Not examined L: Not examined  Patellar Tendinopathy Inferior pole palpation with anterior tilt: R: Negative L: Not examined     TODAY'S TREATMENT    SUBJECTIVE: Reports she is doing well today. Intermittent bilateral knee pain since the last therapy session but L knee pain has not been as sharp. She went the chiropractor on Tuesday and feels like it is very helpful. She was unable to see Dr. Zigmund Daniel this week. No resting pain upon arrival and no  specific questions or concerns.   PAIN: Denies resting hip or knee pain    Ther-ex  NuStep L1-4 x 6 minutes BLE/BUE for strengthening and warm-up while obtaining interval history;  Total Gym (TG) Level 22 (L22) double single leg squats holding 2, 5# dumbbells (DB) 2 x 20; TG L22 double leg heel raises holding 2, 5# DB 2 x 20;  Resisted side stepping with blue tband around ankles 15' x 4; Seated clams with blue tband 2 x 20; Seated adductor ball squeeze 2 x 20; Seated R LAQ with 5# AW 2 x 15; Seated hamstring curls with blue tband 2 x 15;  Standing exercises with 5# ankle weights (AW)  BLE; Hip flexion marches x 15 BLE; Hip abduction x 15 BLE; Hip extension x 15 BLE;   Not performed: Forward BOSU (round side up) step-ups x 10 with each LE; Hooklying bridges 2 x 10; Nautilus resisted gait with 95# forward, backward, R lateral, and L lateral x 5 each direction;  Forward BOSU lunges leading with RLE x 10; R  lateral BOSU lunges x 10; Supine SLR 3# ankle weight (AW) RLE 2 x 10,   L sidelying R hip abduction with 3# AW 2 x 10; L sidelying R hip reverse clams 2 x 10; L sidelying R clams with manual resistance from therapist 2 x 10; 6" forward step-ups leading with RLE x 15, a few reps performed on the LLE as well; 6" R lateral step-ups x 15, a few reps performed with the LLE as well;   PATIENT EDUCATION:  Education details: Pt educated throughout session about proper posture and technique with exercises. Improved exercise technique, movement at target joints, use of target muscles after min to mod verbal, visual, tactile cues.  Person educated: Patient Education method: Explanation Education comprehension: verbalized understanding and returned demonstration   HOME EXERCISE PROGRAM:  Access Code: [email or web address] URL: [email or web address] Date: 06/02/2022 Prepared by: Roxana Hires  Exercises - Supine Quad Set (Mirrored)  - 1 x daily - 7 x weekly - 2 sets - 10 reps -  5s hold - Supine Bridge  - 1 x daily - 7 x weekly - 2 sets - 10 reps - 2s hold - Supine March with Resistance Band  - 1 x daily - 7 x weekly - 2 sets - 10 reps - 2s hold - Seated Hip Adduction Isometrics with Ball  - 1 x daily - 7 x weekly - 2 sets - 10 reps - 2s hold - Seated Hip Abduction with Resistance  - 1 x daily - 7 x weekly - 2 sets - 10 reps - 2s hold - Standing Hip Abduction with Resistance at Ankles and Counter Support  - 1 x daily - 7 x weekly - 2 sets - 10 reps - 2s hold - Standing Hip Extension with Resistance at Counter Support  - 1 x daily - 7 x weekly - 2 sets - 10 reps - 2 hold - Mini Squat with Counter Support  - 1 x daily - 7 x weekly - 2 sets - 10 reps   GOALS: Goals reviewed with patient? Yes  SHORT TERM GOALS: Target date: 06/08/2022  Pt will be independent with HEP to improve strength and decrease knee pain to improve pain-free function at home and work. Baseline:  Goal status: ONGOING   LONG TERM GOALS: Target date: 07/06/2022  Pt will increase FOTO to at least 61 to demonstrate significant improvement in function at home and work related to knee pain  Baseline: 05/11/22: 52; 06/20/22: 55; 07/07/22: 59; Goal status: PARTIALLY MET  2.  Pt will be able to bend and squat without an increase in her R knee pain in order to resume working in her yard/garden with less pain Baseline: 05/11/22: 3/10 R knee/hip pain; 06/20/22: 3/10 R knee/hip pain; 07/07/22: 2/10 "pulling," squatting ability is much better per pt; Goal status: PARTIALLY MET  3.  Pt will increase LEFS score by at least 9 points in order demonstrate clinically significant reduction in knee pain/disability.       Baseline: 05/11/22: 39/80; 06/20/22: 39/80; 07/07/22: 58/80 Goal status: ACHIEVED  4.  Pt will increase strength of R hip abduction to at least 4+/5 MMT grade in order to demonstrate improvement in strength and function and improved stability at right knee . Baseline: 05/11/22: 4-/5; 06/20/22: 4/5 (15# on  dynamometer); 07/07/22: 4/5 (19.5#),   Goal status: PARTIALLY MET   ASSESSMENT:  CLINICAL IMPRESSION: Continued strengthening during session today. Progressed back to more upright and seated exercises. No increase in pain during session  today. No updates to HEP during session today but will consider at future sessions. Therapy frequency will be maintained at 1x/wk. She will benefit from PT services to address deficits in strength and range of motion in order to return to full function at home with less pain.   OBJECTIVE IMPAIRMENTS: Abnormal gait, decreased strength, and pain.   ACTIVITY LIMITATIONS: lifting and squatting  PARTICIPATION LIMITATIONS: cleaning, shopping, community activity, occupation, and yard work  PERSONAL FACTORS: Age, Time since onset of injury/illness/exacerbation, and 3+ comorbidities: DM, obesity, OSA, and afib  are also affecting patient's functional outcome.   REHAB POTENTIAL: Fair    CLINICAL DECISION MAKING: Unstable/unpredictable  EVALUATION COMPLEXITY: High   PLAN: PT FREQUENCY: 1x/week  PT DURATION: 8 weeks  PLANNED INTERVENTIONS: Therapeutic exercises, Therapeutic activity, Neuromuscular re-education, Balance training, Gait training, Patient/Family education, Self Care, Joint mobilization, Joint manipulation, Vestibular training, Canalith repositioning, Orthotic/Fit training, DME instructions, Dry Needling, Electrical stimulation, Spinal manipulation, Spinal mobilization, Cryotherapy, Moist heat, Taping, Traction, Ultrasound, Ionotophoresis '4mg'$ /ml Dexamethasone, Manual therapy, and Re-evaluation.  PLAN FOR NEXT SESSION:  progress strengthening for RLE, review/modify HEP as needed;   Lyndel Safe Miroslav Gin PT, DPT, GCS  Skii Cleland, PT 07/22/2022, 1:20 PM

## 2022-07-21 ENCOUNTER — Ambulatory Visit: Payer: Medicare Other | Admitting: Internal Medicine

## 2022-07-21 ENCOUNTER — Ambulatory Visit: Payer: Medicare Other

## 2022-07-21 DIAGNOSIS — M25562 Pain in left knee: Secondary | ICD-10-CM | POA: Diagnosis not present

## 2022-07-21 DIAGNOSIS — G8929 Other chronic pain: Secondary | ICD-10-CM | POA: Diagnosis not present

## 2022-07-21 DIAGNOSIS — M25561 Pain in right knee: Secondary | ICD-10-CM | POA: Diagnosis not present

## 2022-07-21 DIAGNOSIS — M25551 Pain in right hip: Secondary | ICD-10-CM

## 2022-07-26 ENCOUNTER — Ambulatory Visit
Admission: RE | Admit: 2022-07-26 | Discharge: 2022-07-26 | Disposition: A | Discharge status: Home / Self Care | Payer: Medicare Other | Attending: Family Medicine | Admitting: Family Medicine

## 2022-07-26 ENCOUNTER — Ambulatory Visit
Admission: RE | Admit: 2022-07-26 | Discharge: 2022-07-26 | Disposition: A | Discharge status: Home / Self Care | Payer: Medicare Other | Source: Ambulatory Visit | Attending: Family Medicine | Admitting: Family Medicine

## 2022-07-26 ENCOUNTER — Ambulatory Visit (INDEPENDENT_AMBULATORY_CARE_PROVIDER_SITE_OTHER): Payer: Medicare Other | Admitting: Family Medicine

## 2022-07-26 ENCOUNTER — Encounter: Payer: Self-pay | Admitting: Family Medicine

## 2022-07-26 VITALS — BP 128/60 | HR 66 | Ht 67.0 in | Wt 237.0 lb

## 2022-07-26 DIAGNOSIS — M25562 Pain in left knee: Secondary | ICD-10-CM | POA: Diagnosis not present

## 2022-07-26 DIAGNOSIS — M1712 Unilateral primary osteoarthritis, left knee: Secondary | ICD-10-CM | POA: Diagnosis not present

## 2022-07-26 DIAGNOSIS — M5416 Radiculopathy, lumbar region: Secondary | ICD-10-CM

## 2022-07-26 DIAGNOSIS — M4726 Other spondylosis with radiculopathy, lumbar region: Secondary | ICD-10-CM | POA: Diagnosis not present

## 2022-07-26 MED ORDER — GABAPENTIN 100 MG PO CAPS: 100.0000 mg | ORAL_CAPSULE | Freq: Every day | ORAL | 0 refills | Status: DC

## 2022-07-26 MED ORDER — DICLOFENAC SODIUM 50 MG PO TBEC: 50.0000 mg | DELAYED_RELEASE_TABLET | Freq: Two times a day (BID) | ORAL | 0 refills | Status: DC | PRN

## 2022-07-26 NOTE — Assessment & Plan Note (Signed)
Acute on chronic concern primary localized medial left knee without radiation in the setting of left radicular features discussed elsewhere.  Denies any mechanical symptoms, no swelling, pain with ambulation and ADLs.  Has been dosing ibuprofen 200 mg every few weeks for this issue.  Has been more noticeable as right leg symptoms have improved.  Examination with painful terminal flexion to 130 degrees, tenderness about the medial joint line, no laxity but pain during valgus stressing, otherwise negative anterior/posterior drawer, negative varus stressing, negative Lachman, negative McMurray's, nontender throughout the remainder of the knee.  Clinical features most consistent with medial tibiofemoral and patellofemoral arthralgia, MCL sprain, possibly chronic in nature given symptomatology, plan for dedicated x-rays, diclofenac regimen, activity modifications.  Will return for follow-up in 4 weeks for reevaluation, suboptimal progress to be addressed with consideration of intra-articular corticosteroid.  Ultimately she would benefit from formal physical therapy and as symptoms allow, wean from medications.

## 2022-07-26 NOTE — Progress Notes (Signed)
Primary Care / Sports Medicine Office Visit  Patient Information:  Patient ID: Brandy [name], female DOB: 1955-05-22 Age: 67 y.o. MRN: [id number]   Brandy [name] is a pleasant 67 y.o. female presenting with the following:  Chief Complaint  Patient presents with   Primary osteoarthritis of right knee    Right knee is better, making left side hurt     Vitals:   07/26/22 0835  BP: 128/60  Pulse: 66  SpO2: 99%   Vitals:   07/26/22 0835  Weight: 237 lb (107.5 kg)  Height: '5\' 7"'$  (1.702 m)   Body mass index is 37.12 kg/m.  No results found.   Independent interpretation of notes and tests performed by another provider:   None  Procedures performed:   None  Pertinent History, Exam, Impression, and Recommendations:   Brandy [name] was seen today for primary osteoarthritis of right knee.  Left lumbar radiculopathy Assessment & Plan: Acute on chronic, initially noted roughly 33 years prior while pregnant, has been recurrent and more noticeable as of late.  Involving left posterolateral hip with radiation to the left foot/toes, paresthesias reported.  Noted with activity, has been more noticeable since improvement with her right-sided symptoms.  Examination significant for positive straight leg raise recreating symptomatology, positive piriformis, tenderness at the greater trochanter on the left, negative FADIR.  Plan for dedicated lumbar spine x-rays, initiation of diclofenac 50 mg twice daily x 4 weeks, nightly gabapentin 100 mg, gentle activity as tolerated approved, close follow-up in 4 weeks for reevaluation.  Pending localization of symptoms (piriformis/greater trochanteric region), can consider ultrasound-guided injection(s).  If primarily lumbar/radicular in nature, pending x-rays, advanced imaging and referral to spine group to be coordinated.  Once symptoms allow, she would benefit from formal physical therapy.  Orders: -     DG Lumbar Spine Complete; Future -     Gabapentin;  Take 1 capsule (100 mg total) by mouth at bedtime.  Dispense: 30 capsule; Refill: 0  Arthralgia of left knee Assessment & Plan: Acute on chronic concern primary localized medial left knee without radiation in the setting of left radicular features discussed elsewhere.  Denies any mechanical symptoms, no swelling, pain with ambulation and ADLs.  Has been dosing ibuprofen 200 mg every few weeks for this issue.  Has been more noticeable as right leg symptoms have improved.  Examination with painful terminal flexion to 130 degrees, tenderness about the medial joint line, no laxity but pain during valgus stressing, otherwise negative anterior/posterior drawer, negative varus stressing, negative Lachman, negative McMurray's, nontender throughout the remainder of the knee.  Clinical features most consistent with medial tibiofemoral and patellofemoral arthralgia, MCL sprain, possibly chronic in nature given symptomatology, plan for dedicated x-rays, diclofenac regimen, activity modifications.  Will return for follow-up in 4 weeks for reevaluation, suboptimal progress to be addressed with consideration of intra-articular corticosteroid.  Ultimately she would benefit from formal physical therapy and as symptoms allow, wean from medications.  Orders: -     DG Knee Complete 4 Views Left; Future  Other orders -     Diclofenac Sodium; Take 1 tablet (50 mg total) by mouth 2 (two) times daily as needed.  Dispense: 60 tablet; Refill: 0     Orders & Medications Meds ordered this encounter  Medications   diclofenac (VOLTAREN) 50 MG EC tablet    Sig: Take 1 tablet (50 mg total) by mouth 2 (two) times daily as needed.    Dispense:  60 tablet  Refill:  0   gabapentin (NEURONTIN) 100 MG capsule    Sig: Take 1 capsule (100 mg total) by mouth at bedtime.    Dispense:  30 capsule    Refill:  0   Orders Placed This Encounter  Procedures   DG Lumbar Spine Complete   DG Knee Complete 4 Views Left      Return in about 4 weeks (around 08/23/2022).     Montel Culver, MD, [facility]   Primary Care Sports Medicine Primary Care and Sports Medicine at [facility]

## 2022-07-26 NOTE — Assessment & Plan Note (Signed)
Acute on chronic, initially noted roughly 33 years prior while pregnant, has been recurrent and more noticeable as of late.  Involving left posterolateral hip with radiation to the left foot/toes, paresthesias reported.  Noted with activity, has been more noticeable since improvement with her right-sided symptoms.  Examination significant for positive straight leg raise recreating symptomatology, positive piriformis, tenderness at the greater trochanter on the left, negative FADIR.  Plan for dedicated lumbar spine x-rays, initiation of diclofenac 50 mg twice daily x 4 weeks, nightly gabapentin 100 mg, gentle activity as tolerated approved, close follow-up in 4 weeks for reevaluation.  Pending localization of symptoms (piriformis/greater trochanteric region), can consider ultrasound-guided injection(s).  If primarily lumbar/radicular in nature, pending x-rays, advanced imaging and referral to spine group to be coordinated.  Once symptoms allow, she would benefit from formal physical therapy.

## 2022-07-26 NOTE — Patient Instructions (Signed)
-   Obtain x-rays today - Dose metformin twice daily with food (no other NSAIDs while on this medication) - Start gabapentin nightly, side effect can be drowsiness - Continue activity as tolerated using symptoms as a guide - Return for follow-up in 4 weeks  Additionally: - Can reach out to [place] at the 2-week mark or later if foot/ankle symptoms persist to coordinate visit to review this

## 2022-07-27 ENCOUNTER — Other Ambulatory Visit: Payer: Self-pay | Admitting: Internal Medicine

## 2022-07-27 DIAGNOSIS — E118 Type 2 diabetes mellitus with unspecified complications: Secondary | ICD-10-CM

## 2022-07-27 NOTE — Telephone Encounter (Signed)
Requested Prescriptions  Pending Prescriptions Disp Refills   metFORMIN (GLUCOPHAGE-XR) 500 MG 24 hr tablet [Pharmacy Med Name: metFORMIN HCl ER 500 MG Oral Tablet Extended Release 24 Hour] 90 tablet 0    Sig: Take 1/2 (one-half) tablet by mouth twice daily     Endocrinology:  Diabetes - Biguanides Failed - 07/27/2022  7:17 AM      Failed - B12 Level in normal range and within 720 days    Vitamin B-12  Date Value Ref Range Status  01/21/2020 186 180 - 914 pg/mL Final    Comment:    (NOTE) This assay is not validated for testing neonatal or myeloproliferative syndrome specimens for Vitamin B12 levels. Performed at [facility], [place] [address] [place],  [address]          Failed - CBC within normal limits and completed in the last 12 months    WBC  Date Value Ref Range Status  11/16/2020 6.5 4.0 - 10.5 K/uL Final   RBC  Date Value Ref Range Status  02/22/2021 0 (A) 4.04 - 5.48 M/uL Final  11/16/2020 4.62 3.87 - 5.11 MIL/uL Final   Hemoglobin  Date Value Ref Range Status  11/16/2020 14.6 12.0 - 15.0 g/dL Final  09/21/2020 16.1 (H) 11.1 - 15.9 g/dL Final   HCT  Date Value Ref Range Status  11/16/2020 42.7 36.0 - 46.0 % Final   Hematocrit  Date Value Ref Range Status  09/21/2020 47.8 (H) 34.0 - 46.6 % Final   MCHC  Date Value Ref Range Status  11/16/2020 34.2 30.0 - 36.0 g/dL Final   [facility]  Date Value Ref Range Status  11/16/2020 31.6 26.0 - 34.0 pg Final   MCV  Date Value Ref Range Status  11/16/2020 92.4 80.0 - 100.0 fL Final  09/21/2020 89 79 - 97 fL Final  07/31/2011 90 80 - 100 fL Final   No results found for: "PLTCOUNTKUC", "LABPLAT", "POCPLA" RDW  Date Value Ref Range Status  11/16/2020 12.6 11.5 - 15.5 % Final  09/21/2020 13.2 11.7 - 15.4 % Final  07/31/2011 11.8 11.5 - 14.5 % Final         Passed - Cr in normal range and within 360 days    Creatinine  Date Value Ref Range Status  07/31/2011 1.04 0.60 - 1.30 mg/dL Final    Creatinine, Ser  Date Value Ref Range Status  03/20/2022 0.79 0.57 - 1.00 mg/dL Final         Passed - HBA1C is between 0 and 7.9 and within 180 days    Hgb A1c MFr Bld  Date Value Ref Range Status  03/20/2022 5.8 (H) 4.8 - 5.6 % Final    Comment:             Prediabetes: 5.7 - 6.4          Diabetes: >6.4          Glycemic control for adults with diabetes: <7.0          Passed - eGFR in normal range and within 360 days    EGFR (African American)  Date Value Ref Range Status  07/31/2011 >60 >83m/min Final   GFR calc Af Amer  Date Value Ref Range Status  01/24/2020 >60 >60 mL/min Final   EGFR (Non-African Amer.)  Date Value Ref Range Status  07/31/2011 58 (L) >669mmin Final    Comment:    eGFR values <6063min/1.73 m2 may be an indication of chronic kidney disease (  CKD). Calculated eGFR, using the MRDR Study equation, is useful in  patients with stable renal function. The eGFR calculation will not be reliable in acutely ill patients when serum creatinine is changing rapidly. It is not useful in patients on dialysis. The eGFR calculation may not be applicable to patients at the low and high extremes of body sizes, pregnant women, and vegetarians.    GFR, Estimated  Date Value Ref Range Status  11/16/2020 >60 >60 mL/min Final    Comment:    (NOTE) Calculated using the CKD-EPI Creatinine Equation (2021)    eGFR  Date Value Ref Range Status  03/20/2022 82 >59 mL/min/1.73 Final         Passed - Valid encounter within last 6 months    Recent Outpatient Visits           Yesterday Left lumbar radiculopathy   [facility] [place], Earley Abide, MD   3 months ago Primary osteoarthritis of right knee   [place] [facility] [place], Earley Abide, MD   3 months ago Primary osteoarthritis of right knee   [place] [facility] [place], Earley Abide, MD   4 months ago Primary osteoarthritis of right knee   [place] [facility] [place], Earley Abide, MD   4 months ago Type II diabetes mellitus with complication [facility])   [place] [facility], Jesse Sans, MD       Future Appointments             In 1 month [facility], Jesse Sans, MD [place] at [facility], [facility]

## 2022-07-28 ENCOUNTER — Ambulatory Visit: Payer: Medicare Other

## 2022-07-28 DIAGNOSIS — M25562 Pain in left knee: Secondary | ICD-10-CM | POA: Diagnosis not present

## 2022-07-28 DIAGNOSIS — M25551 Pain in right hip: Secondary | ICD-10-CM | POA: Diagnosis not present

## 2022-07-28 DIAGNOSIS — G8929 Other chronic pain: Secondary | ICD-10-CM

## 2022-07-28 DIAGNOSIS — M25561 Pain in right knee: Secondary | ICD-10-CM | POA: Diagnosis not present

## 2022-07-28 NOTE — Therapy (Signed)
OUTPATIENT PHYSICAL THERAPY KNEE TREATMENT  Patient Name: Brandy [name] MRN: [id number] DOB:06-19-1955, 67 y.o., female Today's Date: 07/28/2022  END OF SESSION:  PT End of Session - 07/28/22 0936     Visit Number 18    Number of Visits 25    Date for PT Re-Evaluation 08/04/22    Authorization Type [employer] [facility]    Authorization Time Period eval: 05/11/22    PT Start Time 0933    PT Stop Time 1015    PT Time Calculation (min) 42 min    Activity Tolerance Patient tolerated treatment well    Behavior During Therapy [facility] for tasks assessed/performed            Past Medical History:  Diagnosis Date   Acute lower GI bleeding 01/19/2020   Arthritis    Atrial fibrillation with rapid ventricular response (HCC)    Chicken pox    Chronic systolic CHF (congestive heart failure) ([place])    a. 03/2016 Echo: Ef 15-20%, diff HK, ant AK;  b. 05/2016 Echo: Ef 30-35%, diff HK, mildly dil LA/RA.   Diabetes mellitus without complication (HCC)    Dysrhythmia    GERD (gastroesophageal reflux disease)    Heart murmur    Hip discomfort    been going on for 40 years    History of kidney stones    Hyperlipidemia associated with type 2 diabetes mellitus ([place]) 05/12/2021   Hypertension    Moderate mitral regurgitation    a. 03/2016 Echo: mod MR in setting of LV dysfxn.   Motion sickness    car - back seat   NICM (nonischemic cardiomyopathy) ([place])    a. 03/2016 Echo: EF 15-20%, diff HK, ant AK, mod MR, mod dil LA, mildly dil RA;  b. 04/2016 Cath: nl cors;  c. 05/2016 Echo: EF 30-35%, diff HK.   Obesity    Obstructive sleep apnea    compliant with CPAP   Persistent atrial fibrillation ([place])    a. Dx 03/2016;  b. CHA2DS2VASc = 2-->Eliquis 5mg  BID;  b. 05/2016 Failed DCCV x 4.   Sleep apnea    Stomach irritation    Visit for monitoring Tikosyn therapy 07/24/2016   Past Surgical History:  Procedure Laterality Date   ATRIAL FIBRILLATION ABLATION N/A 12/09/2020   Procedure: ATRIAL  FIBRILLATION ABLATION;  Surgeon: Vickie Epley, MD;  Location: [place] [facility];  Service: Cardiovascular;  Laterality: N/A;   BIOPSY N/A 01/12/2020   Procedure: BIOPSY;  Surgeon: Virgel Manifold, MD;  Location: [place];  Service: Endoscopy;  Laterality: N/A;   CARDIAC CATHETERIZATION N/A 04/17/2016   Procedure: Left Heart Cath and Coronary Angiography;  Surgeon: Wellington Hampshire, MD;  Location: [place] [facility];  Service: Cardiovascular;  Laterality: N/A;   CARDIOVERSION N/A 10/22/2020   Procedure: CARDIOVERSION;  Surgeon: Nelva Bush, MD;  Location: [place];  Service: Cardiovascular;  Laterality: N/A;   CHOLECYSTECTOMY     COLON SURGERY  2021   COLONOSCOPY N/A 01/20/2020   Procedure: COLONOSCOPY;  Surgeon: Lesly Rubenstein, MD;  Location: [facility];  Service: Endoscopy;  Laterality: N/A;   COLONOSCOPY WITH PROPOFOL N/A 01/12/2020   Procedure: COLONOSCOPY WITH PROPOFOL;  Surgeon: Virgel Manifold, MD;  Location: [place];  Service: Endoscopy;  Laterality: N/A;  Diabetic - oral meds sleep apnea   ELECTROPHYSIOLOGIC STUDY N/A 05/26/2016   Procedure: Cardioversion;  Surgeon: Wellington Hampshire, MD;  Location: [facility];  Service: Cardiovascular;  Laterality: N/A;  ESOPHAGOGASTRODUODENOSCOPY (EGD) WITH PROPOFOL N/A 01/12/2020   Procedure: ESOPHAGOGASTRODUODENOSCOPY (EGD) WITH PROPOFOL;  Surgeon: Virgel Manifold, MD;  Location: [place];  Service: Endoscopy;  Laterality: N/A;   POLYPECTOMY N/A 01/12/2020   Procedure: POLYPECTOMY;  Surgeon: Virgel Manifold, MD;  Location: [place];  Service: Endoscopy;  Laterality: N/A;   Patient Active Problem List   Diagnosis Date Noted   Left lumbar radiculopathy 07/26/2022   Arthralgia of left knee 07/26/2022   Pes anserinus bursitis of right knee 04/19/2022   Primary osteoarthritis of right knee 03/27/2022   Greater 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 03/27/2022    It band syndrome, right 03/27/2022   Hyperlipidemia associated with type 2 diabetes mellitus ([place]) 05/12/2021   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 Halina Maidens  REFERRING PROVIDER: Dr. Rosette Reveal  REFERRING DIAG: M17.11 (ICD-10-CM) - Primary osteoarthritis of right knee, M76.31 (ICD-10-CM) - It band syndrome, right, M25.551 (ICD-10-CM) - Greater 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  RATIONALE FOR EVALUATION AND TREATMENT: Rehabilitation  THERAPY DIAG: Chronic pain of right knee  Pain in right hip  ONSET DATE: August 2023  FOLLOW-UP APPT SCHEDULED WITH REFERRING PROVIDER: Yes   FROM INITIAL EVALUATION SUBJECTIVE:                                                                                                                                                                                         SUBJECTIVE STATEMENT:  R hip and R knee pain  PERTINENT HISTORY:  Pt reports history of chronic R hip pain for decades with recent onset of R knee pain in August 2023. No known trauma but pt thinks she might have aggravated her knee walking across uneven grass. She saw Dr. Zigmund Daniel and eventually had steroid injections in her R greater trochanter, R knee injection, and R pes bursa on 04/18/22. Radiographs (03/14/22) of R knee showed mild degenerative changes at the patellofemoral joint, worse laterally. She receives chiropractic care every 2 weeks for her low back/hip pain and also sees massage therapy once/month.   PAIN:    Pain Intensity: Present: 1/10,  Best: 0/10, Worst: 3/10 Pain location: Medial R knee pain Pain Quality: pressure, sharp Radiating: No  Numbness/Tingling: No  Focal Weakness: Yes, R knee feels unstable and feels like it will hyperextend Aggravating factors: Extended standing, standing after extended sitting, squatting,  Relieving factors: resting, ice, elevation, heat, antiinflammatories, using cane and knee brace; 24-hour pain behavior: activity-dependent How long can you stand: 10 minutes History of prior back, hip, knee injury, pain, surgery, or therapy: Yes, history of chronic back and R hip pain. No prior history of orthopedic surgery to back, hip, or knee. Dominant hand: right Imaging: Yes, see history Red flags: Negative for bowel/bladder changes, saddle paresthesia, personal history of cancer, h/o spinal tumors, h/o compression fx, h/o abdominal aneurysm, abdominal pain, chills/fever, night sweats, nausea, vomiting, unrelenting pain, first onset of insidious LBP <20 y/o  PRECAUTIONS: None  WEIGHT BEARING RESTRICTIONS: No  FALLS: Has patient fallen in last 6 months? 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   Hobbies: Pottery (currently not limited but initially had trouble sitting at the wheel), gardening/yard work;  Patient Goals: walking across the yard with improved confidence, lifting heavier objects at home without pain in hip  OBJECTIVE:  Patient Surveys  LEFS 39/80 FOTO 52, predicted improvement to 61  GAIT: Full gait assessment deferred however gait is notably antalgic on RLE  AROM AROM (Normal range in degrees) AROM    Hip Right Left  Flexion (125) WNL WNL  Extension (15)    Abduction (40)    Adduction      Internal Rotation (45)    External Rotation (45)        Knee    Flexion (135) WNL WNL  Extension (0) WNL WNL      Ankle    Dorsiflexion (20)    Plantarflexion (50)    Inversion (35)    Eversion (15    (* = pain; Blank rows = not tested)  LE MMT: MMT (out of 5) Right  Left   Hip flexion 4 4+  Hip extension 4- 4  Hip abduction 4- 4  Hip adduction 4- 4+  Hip internal rotation 4 5  Hip external rotation 4 5  Knee flexion 4 4+  Knee extension 5 5  Ankle dorsiflexion 4+ 4+  Ankle plantarflexion    Ankle inversion    Ankle eversion    (* = pain; Blank rows = not tested)  Sensation Pt reports decreased sensation in RLE L4-L5 but otherwise intact and symmetrical. Proprioception, and hot/cold testing deferred on this date.  Muscle Length Hamstrings: R: Positive for tightness at 80 degrees L: Positive for tightness at 80 degrees   Palpation Location LEFT  RIGHT           Quadriceps  0  Medial Hamstrings  0  Lateral Hamstrings  0  Lateral Hamstring tendon  0  Medial Hamstring tendon  1  Quadriceps tendon  0  Patella  0  Patellar Tendon  0  Tibial Tuberosity  0  Medial joint line  1  Lateral joint line  0  MCL  1  LCL  0  Adductor Tubercle  1  Pes Anserine tendon  1  Infrapatellar fat pad  0  Fibular head  0  Popliteal fossa  0  (Blank rows = not tested) Graded on 0-4 scale (0 =  no pain, 1 = pain, 2 = pain with wincing/grimacing/flinching, 3 = pain with withdrawal, 4 = unwilling to allow palpation), (Blank rows = not tested)  SPECIAL TESTS  Ligamentous Stability  ACL: Lachman's: R: Negative L: Not examined Anterior Drawer: R: Negative L: Not examined Pivot Shift: R: Negative L: Not examined  PCL: Posterior Drawer: R: Negative L: Not examined Reverse Lachman's: R: Negative L: Not examined Posterior Sag Sign: R: Negative L: Not examined  MCL: Valgus Stress (30 degrees flexion): R: Negative L: Not examined  LCL: Varus Stress (30 degrees flexion): R:  Negative L: Not examined  Meniscus Tests McMurray's Test:  Medial Meniscus (Tibial ER): R: Negative L: Not examined Lateral Meniscus (Tibial IR): R: Negative L: Not examined Thessaly: R: Not examined L: Not examined Steinmann Sign I: R: Negative L: Not examined  Patellofemoral Pain Syndrome Patellar Tilt (Lateral): R: Negative L: Not examined Patellar Apprehension: R: Negative L: Not examined Squatting pain: R: Not examined L: Not examined Stair climbing pain: R: Negative L: Not examined Kneeling pain: R: Positive L: Not examined Resisted knee extension pain: R: Negative L: Not examined  Passive Accessory Intervertebral Motion Pt denies reproduction of hip or knee pain with CPA L1-L5 and UPA bilaterally L1-L5. Generally, hypomobile throughout with tenderness along spine.  Special Tests Lumbar Radiculopathy and Discogenic: Centralization and Peripheralization (SN 92, -LR 0.12): Not examined Slump (SN 83, -LR 0.32): R: Negative L: Negative SLR (SN 92, -LR 0.29): R: Negative L:  Negative Crossed SLR (SP 90): R: Negative L: Not examined  Facet Joint: Extension-Rotation (SN 100, -LR 0.0): R: Negative L: Negative  Lumbar Foraminal Stenosis: Lumbar quadrant (SN 70): R: Negative L: Negative  Hip: FABER (SN 81): R: Positive L: Negative FADIR (SN 94): R: Positive L: Negative Hip scour (SN 50): R: Negative L: Negative Figure 4: R: Negative L: Positive for lateral L hip pain  SIJ:  Thigh Thrust (SN 88, -LR 0.18) : R: Not examined L: Not examined  Piriformis Syndrome: FAIR Test (SN 88, SP 83): R: Not examined L: Not examined  Patellar Tendinopathy Inferior pole palpation with anterior tilt: R: Negative L: Not examined     TODAY'S TREATMENT    SUBJECTIVE: Reports she is doing well today. She saw Dr. Zigmund Daniel who ordered plain films of her low back and L knee due to ongoing knee pain. Overall her R knee and hip pain have improved considerably. She was advised to limit exercise  for her L knee and start gabapentin at night and diclofenac. No resting pain upon arrival and no specific questions.    PAIN: Denies resting hip or knee pain    Ther-ex  NuStep L1-4 x 6 minutes BLE/BUE for strengthening and warm-up while obtaining interval history;  Total Gym (TG) Level 17 (L17) R single leg squats 2 x 10; TG L17 double leg heel raises 2 x 10;  Seated clams with blue tband 3 x 10; Seated adductor ball squeeze 3 x 10; Seated R LAQ with 7.5# AW 3 x 10; Seated hamstring curls with blue tband 3 x 10;  Supine SLR 3# ankle weight (AW) RLE 3 x 10,   R single leg hooklying bridges 3 x 10; Supine R SAQ with heavy manual resistance 3 x 10, no pain;   Not performed: Forward BOSU (round side up) step-ups x 10 with each LE; Nautilus resisted gait with 95# forward, backward, R lateral, and L lateral x 5 each direction;  Forward BOSU lunges leading with RLE x 10;  R lateral BOSU lunges x 10; 6" forward step-ups leading with RLE x 15, a few reps performed on the LLE as well; 6" R lateral step-ups x 15, a few reps performed with the LLE as well; Resisted side stepping with blue tband around ankles 15' x 4; Standing exercises with 5# ankle weights (AW)  BLE; Hip flexion marches x 15 BLE; Hip abduction x 15 BLE; Hip extension x 15 BLE; L sidelying R hip abduction with 3# AW 2 x 10; L sidelying R hip reverse clams 2 x 10; L sidelying R clams with manual resistance from therapist 2 x 10;   PATIENT EDUCATION:  Education details: Pt educated throughout session about proper posture and technique with exercises. Improved exercise technique, movement at target joints, use of target muscles after min to mod verbal, visual, tactile cues.  Person educated: Patient Education method: Explanation Education comprehension: verbalized understanding and returned demonstration   HOME EXERCISE PROGRAM:  Access Code: [email or web address] URL: [email or web address] Date:  06/02/2022 Prepared by: Roxana Hires  Exercises - Supine Quad Set (Mirrored)  - 1 x daily - 7 x weekly - 2 sets - 10 reps - 5s hold - Supine Bridge  - 1 x daily - 7 x weekly - 2 sets - 10 reps - 2s hold - Supine March with Resistance Band  - 1 x daily - 7 x weekly - 2 sets - 10 reps - 2s hold - Seated Hip Adduction Isometrics with Ball  - 1 x daily - 7 x weekly - 2 sets - 10 reps - 2s hold - Seated Hip Abduction with Resistance  - 1 x daily - 7 x weekly - 2 sets - 10 reps - 2s hold - Standing Hip Abduction with Resistance at Ankles and Counter Support  - 1 x daily - 7 x weekly - 2 sets - 10 reps - 2s hold - Standing Hip Extension with Resistance at Counter Support  - 1 x daily - 7 x weekly - 2 sets - 10 reps - 2 hold - Mini Squat with Counter Support  - 1 x daily - 7 x weekly - 2 sets - 10 reps   GOALS: Goals reviewed with patient? Yes  SHORT TERM GOALS: Target date: 06/08/2022  Pt will be independent with HEP to improve strength and decrease knee pain to improve pain-free function at home and work. Baseline:  Goal status: ONGOING   LONG TERM GOALS: Target date: 07/06/2022  Pt will increase FOTO to at least 61 to demonstrate significant improvement in function at home and work related to knee pain  Baseline: 05/11/22: 52; 06/20/22: 55; 07/07/22: 59; Goal status: PARTIALLY MET  2.  Pt will be able to bend and squat without an increase in her R knee pain in order to resume working in her yard/garden with less pain Baseline: 05/11/22: 3/10 R knee/hip pain; 06/20/22: 3/10 R knee/hip pain; 07/07/22: 2/10 "pulling," squatting ability is much better per pt; Goal status: PARTIALLY MET  3.  Pt will increase LEFS score by at least 9 points in order demonstrate clinically significant reduction in knee pain/disability.       Baseline: 05/11/22: 39/80; 06/20/22: 39/80; 07/07/22: 58/80 Goal status: ACHIEVED  4.  Pt will increase strength of R hip abduction to at least 4+/5 MMT grade in order to  demonstrate improvement in strength and function and improved stability at right knee . Baseline: 05/11/22: 4-/5; 06/20/22: 4/5 (15# on dynamometer); 07/07/22: 4/5 (19.5#),   Goal status: PARTIALLY MET  ASSESSMENT:  CLINICAL IMPRESSION: Continued strengthening during session today. Progressed strengthening but limited exercises with LLE at this time. No increase in pain during session today. No updates to HEP during session today but will consider at future sessions. Therapy frequency will be maintained at 1x/wk. She will benefit from PT services to address deficits in strength and range of motion in order to return to full function at home with less pain.   OBJECTIVE IMPAIRMENTS: Abnormal gait, decreased strength, and pain.   ACTIVITY LIMITATIONS: lifting and squatting  PARTICIPATION LIMITATIONS: cleaning, shopping, community activity, occupation, and yard work  PERSONAL FACTORS: Age, Time since onset of injury/illness/exacerbation, and 3+ comorbidities: DM, obesity, OSA, and afib  are also affecting patient's functional outcome.   REHAB POTENTIAL: Fair    CLINICAL DECISION MAKING: Unstable/unpredictable  EVALUATION COMPLEXITY: High   PLAN: PT FREQUENCY: 1x/week  PT DURATION: 8 weeks  PLANNED INTERVENTIONS: Therapeutic exercises, Therapeutic activity, Neuromuscular re-education, Balance training, Gait training, Patient/Family education, Self Care, Joint mobilization, Joint manipulation, Vestibular training, Canalith repositioning, Orthotic/Fit training, DME instructions, Dry Needling, Electrical stimulation, Spinal manipulation, Spinal mobilization, Cryotherapy, Moist heat, Taping, Traction, Ultrasound, Ionotophoresis 4mg /ml Dexamethasone, Manual therapy, and Re-evaluation.  PLAN FOR NEXT SESSION:  progress strengthening for RLE, review/modify HEP as needed;   Lyndel Safe Brylynn Hanssen PT, DPT, GCS  Yousra Ivens, PT 07/28/2022, 10:52 AM

## 2022-07-31 ENCOUNTER — Ambulatory Visit (INDEPENDENT_AMBULATORY_CARE_PROVIDER_SITE_OTHER): Payer: Medicare Other

## 2022-07-31 DIAGNOSIS — I4819 Other persistent atrial fibrillation: Secondary | ICD-10-CM | POA: Diagnosis not present

## 2022-08-01 DIAGNOSIS — L578 Other skin changes due to chronic exposure to nonionizing radiation: Secondary | ICD-10-CM | POA: Diagnosis not present

## 2022-08-01 DIAGNOSIS — D485 Neoplasm of uncertain behavior of skin: Secondary | ICD-10-CM | POA: Diagnosis not present

## 2022-08-01 DIAGNOSIS — D2221 Melanocytic nevi of right ear and external auricular canal: Secondary | ICD-10-CM | POA: Diagnosis not present

## 2022-08-01 DIAGNOSIS — Z86018 Personal history of other benign neoplasm: Secondary | ICD-10-CM | POA: Diagnosis not present

## 2022-08-01 DIAGNOSIS — Z872 Personal history of diseases of the skin and subcutaneous tissue: Secondary | ICD-10-CM | POA: Diagnosis not present

## 2022-08-01 LAB — CUP PACEART REMOTE DEVICE CHECK
Date Time Interrogation Session: 20240317230657
Implantable Pulse Generator Implant Date: 20230928

## 2022-08-04 ENCOUNTER — Ambulatory Visit: Payer: Medicare Other

## 2022-08-04 DIAGNOSIS — M25561 Pain in right knee: Secondary | ICD-10-CM | POA: Diagnosis not present

## 2022-08-04 DIAGNOSIS — G8929 Other chronic pain: Secondary | ICD-10-CM

## 2022-08-04 DIAGNOSIS — M25551 Pain in right hip: Secondary | ICD-10-CM | POA: Diagnosis not present

## 2022-08-04 DIAGNOSIS — M25562 Pain in left knee: Secondary | ICD-10-CM | POA: Diagnosis not present

## 2022-08-04 NOTE — Therapy (Signed)
OUTPATIENT PHYSICAL THERAPY KNEE TREATMENT/DISCHARGE  Patient Name: Brandy [name] MRN: [id number] DOB:1955/10/01, 67 y.o., female Today's Date: 08/04/2022  END OF SESSION:  PT End of Session - 08/04/22 0938     Visit Number 19    Number of Visits 25    Date for PT Re-Evaluation 08/04/22    Authorization Type [employer] [facility]    Authorization Time Period eval: 05/11/22    PT Start Time 0930    PT Stop Time 1015    PT Time Calculation (min) 45 min    Activity Tolerance Patient tolerated treatment well    Behavior During Therapy [facility] for tasks assessed/performed            Past Medical History:  Diagnosis Date   Acute lower GI bleeding 01/19/2020   Arthritis    Atrial fibrillation with rapid ventricular response (HCC)    Chicken pox    Chronic systolic CHF (congestive heart failure) ([place])    a. 03/2016 Echo: Ef 15-20%, diff HK, ant AK;  b. 05/2016 Echo: Ef 30-35%, diff HK, mildly dil LA/RA.   Diabetes mellitus without complication (HCC)    Dysrhythmia    GERD (gastroesophageal reflux disease)    Heart murmur    Hip discomfort    been going on for 40 years    History of kidney stones    Hyperlipidemia associated with type 2 diabetes mellitus ([place]) 05/12/2021   Hypertension    Moderate mitral regurgitation    a. 03/2016 Echo: mod MR in setting of LV dysfxn.   Motion sickness    car - back seat   NICM (nonischemic cardiomyopathy) ([place])    a. 03/2016 Echo: EF 15-20%, diff HK, ant AK, mod MR, mod dil LA, mildly dil RA;  b. 04/2016 Cath: nl cors;  c. 05/2016 Echo: EF 30-35%, diff HK.   Obesity    Obstructive sleep apnea    compliant with CPAP   Persistent atrial fibrillation ([place])    a. Dx 03/2016;  b. CHA2DS2VASc = 2-->Eliquis 5mg  BID;  b. 05/2016 Failed DCCV x 4.   Sleep apnea    Stomach irritation    Visit for monitoring Tikosyn therapy 07/24/2016   Past Surgical History:  Procedure Laterality Date   ATRIAL FIBRILLATION ABLATION N/A 12/09/2020   Procedure: ATRIAL  FIBRILLATION ABLATION;  Surgeon: Vickie Epley, MD;  Location: [place] [facility];  Service: Cardiovascular;  Laterality: N/A;   BIOPSY N/A 01/12/2020   Procedure: BIOPSY;  Surgeon: Virgel Manifold, MD;  Location: [place];  Service: Endoscopy;  Laterality: N/A;   CARDIAC CATHETERIZATION N/A 04/17/2016   Procedure: Left Heart Cath and Coronary Angiography;  Surgeon: Wellington Hampshire, MD;  Location: [place] [facility];  Service: Cardiovascular;  Laterality: N/A;   CARDIOVERSION N/A 10/22/2020   Procedure: CARDIOVERSION;  Surgeon: Nelva Bush, MD;  Location: [place] [facility];  Service: Cardiovascular;  Laterality: N/A;   CHOLECYSTECTOMY     COLON SURGERY  2021   COLONOSCOPY N/A 01/20/2020   Procedure: COLONOSCOPY;  Surgeon: Lesly Rubenstein, MD;  Location: [facility];  Service: Endoscopy;  Laterality: N/A;   COLONOSCOPY WITH PROPOFOL N/A 01/12/2020   Procedure: COLONOSCOPY WITH PROPOFOL;  Surgeon: Virgel Manifold, MD;  Location: [place];  Service: Endoscopy;  Laterality: N/A;  Diabetic - oral meds sleep apnea   ELECTROPHYSIOLOGIC STUDY N/A 05/26/2016   Procedure: Cardioversion;  Surgeon: Wellington Hampshire, MD;  Location: [facility];  Service: Cardiovascular;  Laterality: N/A;  ESOPHAGOGASTRODUODENOSCOPY (EGD) WITH PROPOFOL N/A 01/12/2020   Procedure: ESOPHAGOGASTRODUODENOSCOPY (EGD) WITH PROPOFOL;  Surgeon: Virgel Manifold, MD;  Location: [place];  Service: Endoscopy;  Laterality: N/A;   POLYPECTOMY N/A 01/12/2020   Procedure: POLYPECTOMY;  Surgeon: Virgel Manifold, MD;  Location: [place];  Service: Endoscopy;  Laterality: N/A;   Patient Active Problem List   Diagnosis Date Noted   Left lumbar radiculopathy 07/26/2022   Arthralgia of left knee 07/26/2022   Pes anserinus bursitis of right knee 04/19/2022   Primary osteoarthritis of right knee 03/27/2022   Greater 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 03/27/2022    It band syndrome, right 03/27/2022   Hyperlipidemia associated with type 2 diabetes mellitus ([place]) 05/12/2021   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 Halina Maidens  REFERRING PROVIDER: Dr. Rosette Reveal  REFERRING DIAG: M17.11 (ICD-10-CM) - Primary osteoarthritis of right knee, M76.31 (ICD-10-CM) - It band syndrome, right, M25.551 (ICD-10-CM) - Greater 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  RATIONALE FOR EVALUATION AND TREATMENT: Rehabilitation  THERAPY DIAG: Chronic pain of right knee  Pain in right hip  ONSET DATE: August 2023  FOLLOW-UP APPT SCHEDULED WITH REFERRING PROVIDER: Yes   FROM INITIAL EVALUATION SUBJECTIVE:                                                                                                                                                                                         SUBJECTIVE STATEMENT:  R hip and R knee pain  PERTINENT HISTORY:  Pt reports history of chronic R hip pain for decades with recent onset of R knee pain in August 2023. No known trauma but pt thinks she might have aggravated her knee walking across uneven grass. She saw Dr. Zigmund Daniel and eventually had steroid injections in her R greater trochanter, R knee injection, and R pes bursa on 04/18/22. Radiographs (03/14/22) of R knee showed mild degenerative changes at the patellofemoral joint, worse laterally. She receives chiropractic care every 2 weeks for her low back/hip pain and also sees massage therapy once/month.   PAIN:    Pain Intensity: Present: 1/10,  Best: 0/10, Worst: 3/10 Pain location: Medial R knee pain Pain Quality: pressure, sharp Radiating: No  Numbness/Tingling: No  Focal Weakness: Yes, R knee feels unstable and feels like it will hyperextend Aggravating factors: Extended standing, standing after extended sitting, squatting,  Relieving factors: resting, ice, elevation, heat, antiinflammatories, using cane and knee brace; 24-hour pain behavior: activity-dependent How long can you stand: 10 minutes History of prior back, hip, knee injury, pain, surgery, or therapy: Yes, history of chronic back and R hip pain. No prior history of orthopedic surgery to back, hip, or knee. Dominant hand: right Imaging: Yes, see history Red flags: Negative for bowel/bladder changes, saddle paresthesia, personal history of cancer, h/o spinal tumors, h/o compression fx, h/o abdominal aneurysm, abdominal pain, chills/fever, night sweats, nausea, vomiting, unrelenting pain, first onset of insidious LBP <20 y/o  PRECAUTIONS: None  WEIGHT BEARING RESTRICTIONS: No  FALLS: Has patient fallen in last 6 months? 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   Hobbies: Pottery (currently not limited but initially had trouble sitting at the wheel), gardening/yard work;  Patient Goals: walking across the yard with improved confidence, lifting heavier objects at home without pain in hip  OBJECTIVE:  Patient Surveys  LEFS 39/80 FOTO 52, predicted improvement to 61  GAIT: Full gait assessment deferred however gait is notably antalgic on RLE  AROM AROM (Normal range in degrees) AROM    Hip Right Left  Flexion (125) WNL WNL  Extension (15)    Abduction (40)    Adduction      Internal Rotation (45)    External Rotation (45)        Knee    Flexion (135) WNL WNL  Extension (0) WNL WNL      Ankle    Dorsiflexion (20)    Plantarflexion (50)    Inversion (35)    Eversion (15    (* = pain; Blank rows = not tested)  LE MMT: MMT (out of 5) Right  Left   Hip flexion 4 4+  Hip extension 4- 4  Hip abduction 4- 4  Hip adduction 4- 4+  Hip internal rotation 4 5  Hip external rotation 4 5  Knee flexion 4 4+  Knee extension 5 5  Ankle dorsiflexion 4+ 4+  Ankle plantarflexion    Ankle inversion    Ankle eversion    (* = pain; Blank rows = not tested)  Sensation Pt reports decreased sensation in RLE L4-L5 but otherwise intact and symmetrical. Proprioception, and hot/cold testing deferred on this date.  Muscle Length Hamstrings: R: Positive for tightness at 80 degrees L: Positive for tightness at 80 degrees   Palpation Location LEFT  RIGHT           Quadriceps  0  Medial Hamstrings  0  Lateral Hamstrings  0  Lateral Hamstring tendon  0  Medial Hamstring tendon  1  Quadriceps tendon  0  Patella  0  Patellar Tendon  0  Tibial Tuberosity  0  Medial joint line  1  Lateral joint line  0  MCL  1  LCL  0  Adductor Tubercle  1  Pes Anserine tendon  1  Infrapatellar fat pad  0  Fibular head  0  Popliteal fossa  0  (Blank rows = not tested) Graded on 0-4 scale (0 =  no pain, 1 = pain, 2 = pain with wincing/grimacing/flinching, 3 = pain with withdrawal, 4 = unwilling to allow palpation), (Blank rows = not tested)  SPECIAL TESTS  Ligamentous Stability  ACL: Lachman's: R: Negative L: Not examined Anterior Drawer: R: Negative L: Not examined Pivot Shift: R: Negative L: Not examined  PCL: Posterior Drawer: R: Negative L: Not examined Reverse Lachman's: R: Negative L: Not examined Posterior Sag Sign: R: Negative L: Not examined  MCL: Valgus Stress (30 degrees flexion): R: Negative L: Not examined  LCL: Varus Stress (30 degrees flexion): R:  Negative L: Not examined  Meniscus Tests McMurray's Test:  Medial Meniscus (Tibial ER): R: Negative L: Not examined Lateral Meniscus (Tibial IR): R: Negative L: Not examined Thessaly: R: Not examined L: Not examined Steinmann Sign I: R: Negative L: Not examined  Patellofemoral Pain Syndrome Patellar Tilt (Lateral): R: Negative L: Not examined Patellar Apprehension: R: Negative L: Not examined Squatting pain: R: Not examined L: Not examined Stair climbing pain: R: Negative L: Not examined Kneeling pain: R: Positive L: Not examined Resisted knee extension pain: R: Negative L: Not examined  Passive Accessory Intervertebral Motion Pt denies reproduction of hip or knee pain with CPA L1-L5 and UPA bilaterally L1-L5. Generally, hypomobile throughout with tenderness along spine.  Special Tests Lumbar Radiculopathy and Discogenic: Centralization and Peripheralization (SN 92, -LR 0.12): Not examined Slump (SN 83, -LR 0.32): R: Negative L: Negative SLR (SN 92, -LR 0.29): R: Negative L:  Negative Crossed SLR (SP 90): R: Negative L: Not examined  Facet Joint: Extension-Rotation (SN 100, -LR 0.0): R: Negative L: Negative  Lumbar Foraminal Stenosis: Lumbar quadrant (SN 70): R: Negative L: Negative  Hip: FABER (SN 81): R: Positive L: Negative FADIR (SN 94): R: Positive L: Negative Hip scour (SN 50): R: Negative L: Negative Figure 4: R: Negative L: Positive for lateral L hip pain  SIJ:  Thigh Thrust (SN 88, -LR 0.18) : R: Not examined L: Not examined  Piriformis Syndrome: FAIR Test (SN 88, SP 83): R: Not examined L: Not examined  Patellar Tendinopathy Inferior pole palpation with anterior tilt: R: Negative L: Not examined     TODAY'S TREATMENT    SUBJECTIVE: Pt reports she is doing well today. Overall her R knee and hip pain have improved considerably since starting therapy. No resting pain upon arrival and no specific questions.    PAIN: Denies resting hip or knee pain     Ther-ex  NuStep L1-4 x 6 minutes BLE/BUE for strengthening and warm-up while obtaining interval history; Updated outcome measures with patient (see goal section); R single leg hooklying bridges 2 x 10; L sidelying R hip abduction with 3# Ankle Weight (AW) 2 x 10; L sidelying R hip reverse clams with 3# AW 2 x 10; L sidelying R clams with manual resistance from therapist 2 x 10; Supine SLR 3# ankle weight (AW) RLE x 20, x 15;   Generated aquatic exercise program and reviewed with patient, discussed discharge instructions;   PATIENT EDUCATION:  Education details: Pt educated throughout session about proper posture and technique with exercises. Improved exercise technique, movement at target joints, use of target muscles after min to mod verbal, visual, tactile cues. Discharge Person educated: Patient Education method: Explanation Education comprehension: verbalized understanding and returned demonstration   HOME EXERCISE PROGRAM:  Access Code: [email or web address] URL: [email or web address] Date: 06/02/2022 Prepared by: Roxana Hires  Exercises - Supine Quad Set (Mirrored)  - 1 x daily - 7 x weekly - 2  sets - 10 reps - 5s hold - Supine Bridge  - 1 x daily - 7 x weekly - 2 sets - 10 reps - 2s hold - Supine March with Resistance Band  - 1 x daily - 7 x weekly - 2 sets - 10 reps - 2s hold - Seated Hip Adduction Isometrics with Ball  - 1 x daily - 7 x weekly - 2 sets - 10 reps - 2s hold - Seated Hip Abduction with Resistance  - 1 x daily - 7 x weekly - 2 sets - 10 reps - 2s hold - Standing Hip Abduction with Resistance at Ankles and Counter Support  - 1 x daily - 7 x weekly - 2 sets - 10 reps - 2s hold - Standing Hip Extension with Resistance at Counter Support  - 1 x daily - 7 x weekly - 2 sets - 10 reps - 2 hold - Mini Squat with Counter Support  - 1 x daily - 7 x weekly - 2 sets - 10 reps   Access Code: [email or web address] URL: [email or web address] Date: 08/04/2022 Prepared  by: Roxana Hires  Exercises - Forward Walking  - 1 x daily - 2 x weekly - Backward Walking  - 1 x daily - 2 x weekly - Forward March  - 1 x daily - 2 x weekly - Backward March  - 1 x daily - 2 x weekly - Forward March with Opposite Arm Knee Taps and Hand Floats  - 1 x daily - 2 x weekly - Backward March with Opposite Arm Knee Taps and Hand Floats  - 1 x daily - 2 x weekly - Standing March at [facility]  - 1 x daily - 2 x weekly - 2 sets - 15 reps - Standing Hip Flexion Extension at [employer]  - 1 x daily - 2 x weekly - 2 sets - 15 reps - Standing Hip Abduction Adduction at [employer]  - 1 x daily - 2 x weekly - 2 sets - 15 reps - Squat  - 1 x daily - 2 x weekly - 2 sets - 15 reps - Single Leg Squat  - 1 x daily - 2 x weekly - 2 sets - 15 reps - Hamstring Stretch with Noodle  - 1 x daily - 7 x weekly - 3 reps - 30s hold - Hip Flexor Stretch with Noodle  - 1 x daily - 7 x weekly - 3 reps - 30s hold   GOALS: Goals reviewed with patient? Yes  SHORT TERM GOALS:   Pt will be independent with HEP to improve strength and decrease knee pain to improve pain-free function at home and work. Baseline:  Goal status: ACHIEVED   LONG TERM GOALS:   Pt will increase FOTO to at least 61 to demonstrate significant improvement in function at home and work related to knee pain  Baseline: 05/11/22: 52; 06/20/22: 55; 07/07/22: 59; 08/04/22: 72; Goal status: ACHIEVED  2.  Pt will be able to bend and squat without an increase in her R knee pain in order to resume working in her yard/garden with less pain Baseline: 05/11/22: 3/10 R knee/hip pain; 06/20/22: 3/10 R knee/hip pain; 07/07/22: 2/10 "pulling," squatting ability is much better per pt; 08/04/22: No pain; Goal status: ACHIEVED  3.  Pt will increase LEFS score by at least 9 points in order demonstrate clinically significant reduction in knee pain/disability.       Baseline: 05/11/22: 39/80; 06/20/22: 39/80; 2/[id number];  08/04/22: 62/80 Goal status:  ACHIEVED  4.  Pt will increase strength of R hip abduction to at least 4+/5 MMT grade in order to demonstrate improvement in strength and function and improved stability at right knee . Baseline: 05/11/22: 4-/5; 06/20/22: 4/5 (15# on dynamometer); 07/07/22: 4/5 (19.5#),  08/04/22: 4+/5 (29.5#) Goal status: ACHIEVED   ASSESSMENT:  CLINICAL IMPRESSION: Updated outcome measures during visit today. She no longer has any R knee pain with squatting. Her LEFS improved considerably from 39/80 at her initial evaluation to 62/80 today. Patient's FOTO also improved from 19 at initial evaluation to 25 today. Her R hip abduction strength increased from 4-/5 initially to 4/5 on 06/20/22 (15#) and 4+/5 today (29.5#). She is independent with her HEP. Progressed strengthening during session today and she denies any increase in pain. Generatic an aquatic therapy program and issued to patient. At this time pt has met all of her goals and is ready to discharge.   OBJECTIVE IMPAIRMENTS: Abnormal gait, decreased strength, and pain.   ACTIVITY LIMITATIONS: lifting and squatting  PARTICIPATION LIMITATIONS: cleaning, shopping, community activity, occupation, and yard work  PERSONAL FACTORS: Age, Time since onset of injury/illness/exacerbation, and 3+ comorbidities: DM, obesity, OSA, and afib  are also affecting patient's functional outcome.   REHAB POTENTIAL: Fair    CLINICAL DECISION MAKING: Unstable/unpredictable  EVALUATION COMPLEXITY: High   PLAN: PT FREQUENCY: 1x/week  PT DURATION: 8 weeks  PLANNED INTERVENTIONS: Therapeutic exercises, Therapeutic activity, Neuromuscular re-education, Balance training, Gait training, Patient/Family education, Self Care, Joint mobilization, Joint manipulation, Vestibular training, Canalith repositioning, Orthotic/Fit training, DME instructions, Dry Needling, Electrical stimulation, Spinal manipulation, Spinal mobilization, Cryotherapy, Moist heat, Taping, Traction, Ultrasound,  Ionotophoresis 4mg /ml Dexamethasone, Manual therapy, and Re-evaluation.  PLAN FOR NEXT SESSION:  Discharge  Phillips Grout PT, DPT, GCS  Collette Pescador, PT 08/04/2022, 9:32 PM

## 2022-08-09 NOTE — Progress Notes (Signed)
Care[facility] Summary Report / Loop Recorder 

## 2022-08-11 ENCOUNTER — Ambulatory Visit: Payer: Medicare Other

## 2022-08-11 DIAGNOSIS — M25562 Pain in left knee: Secondary | ICD-10-CM | POA: Diagnosis not present

## 2022-08-11 DIAGNOSIS — M25561 Pain in right knee: Secondary | ICD-10-CM | POA: Diagnosis not present

## 2022-08-11 DIAGNOSIS — G8929 Other chronic pain: Secondary | ICD-10-CM | POA: Diagnosis not present

## 2022-08-11 DIAGNOSIS — M25551 Pain in right hip: Secondary | ICD-10-CM | POA: Diagnosis not present

## 2022-08-11 NOTE — Therapy (Addendum)
OUTPATIENT PHYSICAL THERAPY KNEE EVALUATION  Patient Name: MURPHEE TOUPIN MRN: [id number] DOB:1955-09-21, 67 y.o., female Today's Date: 08/12/2022  END OF SESSION:  PT End of Session - 08/11/22 0803     Visit Number 1    Number of Visits 17    Date for PT Re-Evaluation 10/06/22    Authorization Type [employer] [facility]    Authorization Time Period eval: 08/11/22    PT Start Time 0805    PT Stop Time 0845    PT Time Calculation (min) 40 min    Activity Tolerance Patient tolerated treatment well    Behavior During Therapy [facility] for tasks assessed/performed            Past Medical History:  Diagnosis Date   Acute lower GI bleeding 01/19/2020   Arthritis    Atrial fibrillation with rapid ventricular response (HCC)    Chicken pox    Chronic systolic CHF (congestive heart failure) ([place])    a. 03/2016 Echo: Ef 15-20%, diff HK, ant AK;  b. 05/2016 Echo: Ef 30-35%, diff HK, mildly dil LA/RA.   Diabetes mellitus without complication (HCC)    Dysrhythmia    GERD (gastroesophageal reflux disease)    Heart murmur    Hip discomfort    been going on for 40 years    History of kidney stones    Hyperlipidemia associated with type 2 diabetes mellitus ([place]) 05/12/2021   Hypertension    Moderate mitral regurgitation    a. 03/2016 Echo: mod MR in setting of LV dysfxn.   Motion sickness    car - back seat   NICM (nonischemic cardiomyopathy) ([place])    a. 03/2016 Echo: EF 15-20%, diff HK, ant AK, mod MR, mod dil LA, mildly dil RA;  b. 04/2016 Cath: nl cors;  c. 05/2016 Echo: EF 30-35%, diff HK.   Obesity    Obstructive sleep apnea    compliant with CPAP   Persistent atrial fibrillation ([place])    a. Dx 03/2016;  b. CHA2DS2VASc = 2-->Eliquis 5mg  BID;  b. 05/2016 Failed DCCV x 4.   Sleep apnea    Stomach irritation    Visit for monitoring Tikosyn therapy 07/24/2016   Past Surgical History:  Procedure Laterality Date   ATRIAL FIBRILLATION ABLATION N/A 12/09/2020   Procedure: ATRIAL  FIBRILLATION ABLATION;  Surgeon: Vickie Epley, MD;  Location: [place] [facility];  Service: Cardiovascular;  Laterality: N/A;   BIOPSY N/A 01/12/2020   Procedure: BIOPSY;  Surgeon: Virgel Manifold, MD;  Location: [place];  Service: Endoscopy;  Laterality: N/A;   CARDIAC CATHETERIZATION N/A 04/17/2016   Procedure: Left Heart Cath and Coronary Angiography;  Surgeon: Wellington Hampshire, MD;  Location: [place] [facility];  Service: Cardiovascular;  Laterality: N/A;   CARDIOVERSION N/A 10/22/2020   Procedure: CARDIOVERSION;  Surgeon: Nelva Bush, MD;  Location: [place] [facility];  Service: Cardiovascular;  Laterality: N/A;   CHOLECYSTECTOMY     COLON SURGERY  2021   COLONOSCOPY N/A 01/20/2020   Procedure: COLONOSCOPY;  Surgeon: Lesly Rubenstein, MD;  Location: [facility];  Service: Endoscopy;  Laterality: N/A;   COLONOSCOPY WITH PROPOFOL N/A 01/12/2020   Procedure: COLONOSCOPY WITH PROPOFOL;  Surgeon: Virgel Manifold, MD;  Location: [place];  Service: Endoscopy;  Laterality: N/A;  Diabetic - oral meds sleep apnea   ELECTROPHYSIOLOGIC STUDY N/A 05/26/2016   Procedure: Cardioversion;  Surgeon: Wellington Hampshire, MD;  Location: [facility];  Service: Cardiovascular;  Laterality: N/A;  ESOPHAGOGASTRODUODENOSCOPY (EGD) WITH PROPOFOL N/A 01/12/2020   Procedure: ESOPHAGOGASTRODUODENOSCOPY (EGD) WITH PROPOFOL;  Surgeon: Virgel Manifold, MD;  Location: [place];  Service: Endoscopy;  Laterality: N/A;   POLYPECTOMY N/A 01/12/2020   Procedure: POLYPECTOMY;  Surgeon: Virgel Manifold, MD;  Location: [place];  Service: Endoscopy;  Laterality: N/A;   Patient Active Problem List   Diagnosis Date Noted   Left lumbar radiculopathy 07/26/2022   Arthralgia of left knee 07/26/2022   Pes anserinus bursitis of right knee 04/19/2022   Primary osteoarthritis of right knee 03/27/2022   Greater 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 03/27/2022    It band syndrome, right 03/27/2022   Hyperlipidemia associated with type 2 diabetes mellitus ([place]) 05/12/2021   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   Varicose veins of leg with pain, bilateral 07/06/2020   Acquired thrombophilia ([place]) 05/24/2020   Type II diabetes mellitus with complication ([place]) 123456   Gastric polyp    Colon polyps    Varicose veins of both lower extremities 10/20/2019   Dysphagia 10/20/2019   Osteoarthritis of right hand 10/31/2016   Nonischemic cardiomyopathy ([place]) 04/14/2016   BMI 33.0-33.9,adult 04/14/2016    PCP: Dr. Halina Maidens  REFERRING PROVIDER: Dr. Rosette Reveal  REFERRING DIAG: Arthralgia of left knee  RATIONALE FOR EVALUATION AND TREATMENT: Rehabilitation  THERAPY DIAG: Chronic pain of left knee - Plan: PT plan of care cert/re-cert  ONSET DATE: Ongoing for years, pt unsure of start date  FOLLOW-UP APPT SCHEDULED WITH REFERRING PROVIDER: Yes    SUBJECTIVE:                                                                                                                                                                                         SUBJECTIVE STATEMENT:  L knee pain  PERTINENT HISTORY:  Pt reports chronic L knee pain for multiple years. No known trauma and no prior history of surgery. Pain is mostly located in the inside portion of her knee. She saw Dr. Zigmund Daniel who ordered plain films which showed severe medial and mild-to-moderate patellofemoral compartment osteoarthritis. At the time of her evaluation it was felt that her clinical features were most consistent with medial tibiofemoral and patellofemaral arthralgia and possible chronic MCL sprains. She was prescribed a regimen of diclofenac, educated in activity modification, and advised to return in 4 weeks to consider intra-articular steroid injection if symptoms have not improved. It was also recommended that pt work with physical therapy. At this time pt would like  to defer a steroid injection due to concerns of her blood sugar regulation. Pt was previously seen by physical therapy for R  hip and R knee pain which responded well to therapy. She also has a history of chronic low back pain which started initially 33 years ago when she was pregnant. She was prescribed Gabapentin and also advised to start physical therapy as symptoms will allow.   History from 05/11/22 Pt reports history of chronic R hip pain for decades with recent onset of R knee pain in August 2023. No known trauma but pt thinks she might have aggravated her knee walking across uneven grass. She saw Dr. Zigmund Daniel and eventually had steroid injections in her R greater trochanter, R knee injection, and R pes bursa on 04/18/22. Radiographs (03/14/22) of R knee showed mild degenerative changes at the patellofemoral joint, worse laterally. She receives chiropractic care every 2 weeks for her low back/hip pain and also sees massage therapy once/month.   L knee radiographs: 07/26/22: IMPRESSION: Severe medial and mild-to-moderate patellofemoral compartment osteoarthritis.   PAIN:    Pain Intensity: Present: 3/10, Best: 0/10, Worst: 8/10 Pain location: Medial L knee pain radiating up inner thigh and down to the L foot; Pain Quality: sharp Radiating: Yes, up the medial L thigh and down to the L foot, occasional L toe throbbing  Numbness/Tingling: Yes, lateral L knee; Focal Weakness: Yes, L knee feels unstable and buckles, no hyperextension Aggravating factors: Walking too fast, extended standing, standing after extended sitting, steps; Relieving factors: resting, ice, elevation, heat, antiinflammatories, knee brace; 24-hour pain behavior: worsens with activity as day progresses History of prior back, hip, knee injury, pain, surgery, or therapy: Yes, history of chronic back, R hip/knee pain, and L hip pain. No prior history of orthopedic surgery to back, hip, or knee. Dominant hand: right Imaging: Yes,  see history Red flags: Negative for bowel/bladder changes, saddle paresthesia, personal history of cancer, h/o spinal tumors, h/o compression fx, h/o abdominal aneurysm, abdominal pain, chills/fever, night sweats, nausea, vomiting, and unrelenting pain;  PRECAUTIONS: None  WEIGHT BEARING RESTRICTIONS: No  FALLS: Has patient fallen in last 6 months? 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   Hobbies: Pottery (currently not limited but initially had trouble sitting at the wheel), gardening/yard work;  Patient Goals: walking across the yard with improved confidence, lifting heavier objects at home without pain in hip, working the yard, "I would like to be able to get back to walking."   OBJECTIVE:  Patient Surveys  LEFS To be completed FOTO 47, predicted improvement to 38  Cognition Patient is oriented to person, place, and time.  Recent memory is intact.  Remote memory is intact.  Attention span and concentration are intact.  Expressive speech is intact.  Patient's fund of knowledge is within normal limits for educational level.    Gross Musculoskeletal Assessment Tremor: None Bulk: Normal Tone: Normal  GAIT: Full gait assessment deferred however pt does demonstrate medial whip of R heel and bilateral ankle pronation during gait. No excessive frontal plane motion of pelvis noted. Gait does appears slightly antalgic  Posture:  Forward head/rounded shoulders in sitting, full posture assessment deferred;   AROM AROM (Normal range in degrees) AROM   Lumbar   Flexion (65) Moderate loss of segmental flexion  Extension (30) Mild loss  Right lateral flexion (  25) Mild loss  Left  lateral flexion (25) Mild loss  Right rotation (30) Mild loss  Left rotation (30) Mild loss   AROM (Normal range in degrees) AROM    Hip Right Left  Flexion (125) WNL WNL  Extension (15)    Abduction (40) 25 25  Adduction     Internal Rotation (45)    External Rotation (45)        Knee    Flexion (135) 125*, soft end feel 125*, soft end feel  Extension (0) 0 0      Ankle    Dorsiflexion (20)    (* = pain; Blank rows = not tested)  LE MMT: MMT (out of 5) Right  Left   Hip flexion 5 4+  Hip extension    Hip abduction    Hip adduction    Hip internal rotation 5 4+  Hip external rotation 5 4+  Knee flexion    Knee extension 5 5  Ankle dorsiflexion 5 4+  Ankle plantarflexion Stron5 Strong  (* = pain; Blank rows = not tested)  Sensation Deferred  Reflexes Deferred  Muscle Length Hamstrings: R: Positive for tightness at 80 degrees L: Positive for tightness at 80 degrees  Quadriceps Pat Patrick): R: Not examined L: Not examined Hip flexors Marcello Moores): R: Not examined L: Not examined IT band Nicoletta Dress): R: Not examined L: Not examined  Palpation Location LEFT  RIGHT           Quadriceps 0   Medial Hamstrings 0   Lateral Hamstrings 0   Lateral Hamstring tendon 1   Medial Hamstring tendon 1   Quadriceps tendon 0   Patella 0   Patellar Tendon 0   Tibial Tuberosity 1   Medial joint line 1   Lateral joint line 1   MCL 1   LCL 1   Adductor Tubercle 1   Pes Anserine tendon 1   Infrapatellar fat pad 0   Fibular head 1   Popliteal fossa 0   (Blank rows = not tested) Graded on 0-4 scale (0 = no pain, 1 = pain, 2 = pain with wincing/grimacing/flinching, 3 = pain with withdrawal, 4 = unwilling to allow palpation), (Blank rows = not tested)  VASCULAR Deferred  SPECIAL TESTS  Ligamentous Stability  ACL: Lachman's: R: Not examined L: Negative Active Lachman's: R: Not examined L: Negative Anterior Drawer: R: Not examined L: Negative Pivot Shift: R: Not examined L:  Negative  PCL: Posterior Drawer: R: Not examined L: Negative Reverse Lachman's: R: Not examined L: Negative Posterior Sag Sign: R: Not examined L: Negative  MCL: Valgus Stress (30 degrees flexion): R: Not examined L: Negative  LCL: Varus Stress (30 degrees flexion): R: Not examined L: Negative  Meniscus Tests McMurray's Test:  Medial Meniscus (Tibial ER): R: Not examined L: Negative Lateral Meniscus (Tibial IR): R: Not examined L: Negative Thessaly: R: Not examined L: Not examined Ege's: R: Not examined L: Not examined Effusion: R: Not examined L: Negative   Patellofemoral Pain Syndrome Patellar Tilt (Lateral): R: Not examined L: Negative Patellar Apprehension: R: Not examined L: Not examined Squatting pain: R: Not examined L: Positive Stair climbing pain: R: Not examined L: Positive Kneeling pain: R: Not examined L: Positive Resisted knee extension pain: R: Not examined L: Negative Compression: R: Not examined L: Positive  Patellar Tendinopathy Inferior pole palpation with anterior tilt: R: Not examined L: Negative  Passive Accessory Intervertebral Motion Deferred  Hip: FABER (SN 81): R: Positive L:  Negative FADIR (SN 94): R: Positive L: Negative Hip scour (SN 50): R: Negative L: Negative Figure 4: R: Negative L: Positive for lateral L hip pain  SIJ:  Thigh Thrust (SN 88, -LR 0.18) : R: Not examined L: Not examined  Piriformis Syndrome: FAIR Test (SN 88, SP 83): R: Not examined L: Not examined  Functional Tasks Deferred   Beighton Scale: Deferred, denies history of hypermobility   TODAY'S TREATMENT  Deferred   PATIENT EDUCATION:  Education details: Plan of care Person educated: Patient Education method: Explanation Education comprehension: verbalized understanding   HOME EXERCISE PROGRAM:  None currently   GOALS: Goals reviewed with patient? Yes  SHORT TERM GOALS: Target date: 09/08/2022   Pt will be independent with HEP to improve strength  and decrease knee pain to improve pain-free function at home and work. Baseline:  Goal status: INITIAL   LONG TERM GOALS: Target date: 10/06/2022   Pt will increase FOTO to at least 58 to demonstrate significant improvement in function at home and work related to knee pain  Baseline: 08/11/22: 47 Goal status: INITIAL  2.  Pt will be able to bend, squat, and walk across an uneven yard without an increase in her L knee pain in order to resume working in her yard/garden as well as return to walking for exercise with less pain Baseline:  Goal status: INITIAL  3.  Pt will increase LEFS score by at least 9 points in order demonstrate clinically significant reduction in knee pain/disability.       Baseline: 08/11/22: 43/80 Goal status: INITIAL  4.  Pt will increase strength of L hip abduction to within 10% of R side in order to demonstrate improvement in strength and function and improved stability at left knee . Baseline: 08/11/22: To be completed Goal status: INITIAL   ASSESSMENT:  CLINICAL IMPRESSION: Patient is a 67 y.o. female who was seen today for physical therapy evaluation and treatment for L knee pain. Pt has been previously treated at this clinic for R knee and hip pain with positive response. Pt with painful palpation to L knee medial and lateral joint lines as well as patellar compression and overpressure at end range flexion. Painful squatting and stair ascend/descend. Decreased strength in L hip and knee compared to R side. No ligamentous instability appreciated    OBJECTIVE IMPAIRMENTS: Abnormal gait, decreased strength, and pain.   ACTIVITY LIMITATIONS: lifting, standing, squatting, and stairs  PARTICIPATION LIMITATIONS: cleaning, shopping, community activity, occupation, and yard work  PERSONAL FACTORS: Age, Time since onset of injury/illness/exacerbation, and 3+ comorbidities: DM, obesity, OSA, and afib  are also affecting patient's functional outcome.   REHAB POTENTIAL:  Fair    CLINICAL DECISION MAKING: Unstable/unpredictable  EVALUATION COMPLEXITY: Moderate   PLAN: PT FREQUENCY: 1-2x/week  PT DURATION: 8 weeks  PLANNED INTERVENTIONS: Therapeutic exercises, Therapeutic activity, Neuromuscular re-education, Balance training, Gait training, Patient/Family education, Self Care, Joint mobilization, Joint manipulation, Vestibular training, Canalith repositioning, Orthotic/Fit training, DME instructions, Dry Needling, Electrical stimulation, Spinal manipulation, Spinal mobilization, Cryotherapy, Moist heat, Taping, Traction, Ultrasound, Ionotophoresis 4mg /ml Dexamethasone, Manual therapy, and Re-evaluation.  PLAN FOR NEXT SESSION: Measure bilateral hip IR/ER, dynamometry testing of L hip abduction, update goals, initiate strengthening for LLE,  issue HEP;  Sharalyn Ink Nealy Hickmon PT, DPT, GCS  Talin Rozeboom, PT 08/12/2022, 9:36 AM

## 2022-08-24 ENCOUNTER — Ambulatory Visit: Payer: Medicare Other | Attending: Family Medicine

## 2022-08-24 DIAGNOSIS — M25561 Pain in right knee: Secondary | ICD-10-CM | POA: Diagnosis not present

## 2022-08-24 DIAGNOSIS — M25562 Pain in left knee: Secondary | ICD-10-CM | POA: Insufficient documentation

## 2022-08-24 DIAGNOSIS — M25551 Pain in right hip: Secondary | ICD-10-CM | POA: Diagnosis not present

## 2022-08-24 DIAGNOSIS — G8929 Other chronic pain: Secondary | ICD-10-CM | POA: Insufficient documentation

## 2022-08-24 NOTE — Therapy (Signed)
OUTPATIENT PHYSICAL THERAPY KNEE TREATMENT  Patient Name: Brandy [name] MRN: [id number] DOB:05-23-1955, 67 y.o., female Today's Date: 08/27/2022  END OF SESSION:  PT End of Session - 08/27/22 1651     Visit Number 2    Number of Visits 17    Date for PT Re-Evaluation 10/06/22    Authorization Type [employer] [facility]    Authorization Time Period eval: 08/11/22    PT Start Time 1705    PT Stop Time 1750    PT Time Calculation (min) 45 min    Activity Tolerance Patient tolerated treatment well    Behavior During Therapy [facility] for tasks assessed/performed            Past Medical History:  Diagnosis Date   Acute lower GI bleeding 01/19/2020   Arthritis    Atrial fibrillation with rapid ventricular response    Chicken pox    Chronic systolic CHF (congestive heart failure)    a. 03/2016 Echo: Ef 15-20%, diff HK, ant AK;  b. 05/2016 Echo: Ef 30-35%, diff HK, mildly dil LA/RA.   Diabetes mellitus without complication    Dysrhythmia    GERD (gastroesophageal reflux disease)    Heart murmur    Hip discomfort    been going on for 40 years    History of kidney stones    Hyperlipidemia associated with type 2 diabetes mellitus 05/12/2021   Hypertension    Moderate mitral regurgitation    a. 03/2016 Echo: mod MR in setting of LV dysfxn.   Motion sickness    car - back seat   NICM (nonischemic cardiomyopathy)    a. 03/2016 Echo: EF 15-20%, diff HK, ant AK, mod MR, mod dil LA, mildly dil RA;  b. 04/2016 Cath: nl cors;  c. 05/2016 Echo: EF 30-35%, diff HK.   Obesity    Obstructive sleep apnea    compliant with CPAP   Persistent atrial fibrillation    a. Dx 03/2016;  b. CHA2DS2VASc = 2-->Eliquis 5mg  BID;  b. 05/2016 Failed DCCV x 4.   Sleep apnea    Stomach irritation    Visit for monitoring Tikosyn therapy 07/24/2016   Past Surgical History:  Procedure Laterality Date   ATRIAL FIBRILLATION ABLATION N/A 12/09/2020   Procedure: ATRIAL FIBRILLATION ABLATION;  Surgeon: Lanier Prude, MD;  Location: [facility];  Service: Cardiovascular;  Laterality: N/A;   BIOPSY N/A 01/12/2020   Procedure: BIOPSY;  Surgeon: Pasty Spillers, MD;  Location: [facility];  Service: Endoscopy;  Laterality: N/A;   CARDIAC CATHETERIZATION N/A 04/17/2016   Procedure: Left Heart Cath and Coronary Angiography;  Surgeon: Iran Ouch, MD;  Location: [facility] INVASIVE CV LAB;  Service: Cardiovascular;  Laterality: N/A;   CARDIOVERSION N/A 10/22/2020   Procedure: CARDIOVERSION;  Surgeon: Yvonne Kendall, MD;  Location: [facility] OR[facility];  Service: Cardiovascular;  Laterality: N/A;   CHOLECYSTECTOMY     COLON SURGERY  2021   COLONOSCOPY N/A 01/20/2020   Procedure: COLONOSCOPY;  Surgeon: Regis Bill, MD;  Location: [facility] ENDOSCOPY;  Service: Endoscopy;  Laterality: N/A;   COLONOSCOPY WITH PROPOFOL N/A 01/12/2020   Procedure: COLONOSCOPY WITH PROPOFOL;  Surgeon: Pasty Spillers, MD;  Location: [facility];  Service: Endoscopy;  Laterality: N/A;  Diabetic - oral meds sleep apnea   ELECTROPHYSIOLOGIC STUDY N/A 05/26/2016   Procedure: Cardioversion;  Surgeon: Iran Ouch, MD;  Location: [facility] OR[facility];  Service: Cardiovascular;  Laterality: N/A;   ESOPHAGOGASTRODUODENOSCOPY (EGD) WITH PROPOFOL  N/A 01/12/2020   Procedure: ESOPHAGOGASTRODUODENOSCOPY (EGD) WITH PROPOFOL;  Surgeon: Pasty Spillersahiliani, Varnita B, MD;  [facility];  Service: Endoscopy;  Laterality: N/A;   POLYPECTOMY N/A 01/12/2020   Procedure: POLYPECTOMY;  Surgeon: Pasty Spillersahiliani, Varnita B, MD;  [facility];  Service: Endoscopy;  Laterality: N/A;   Patient Active Problem List   Diagnosis Date Noted   Left lumbar radiculopathy 07/26/2022   Arthralgia of left knee 07/26/2022   Pes anserinus bursitis of right knee 04/19/2022   Primary osteoarthritis of right knee 03/27/2022   Greater 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 03/27/2022   It band syndrome, right 03/27/2022    Hyperlipidemia associated with type 2 diabetes mellitus 05/12/2021   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   Varicose veins of leg with pain, bilateral 07/06/2020   Acquired thrombophilia 05/24/2020   Type II diabetes mellitus with complication 01/19/2020   Gastric polyp    Colon polyps    Varicose veins of both lower extremities 10/20/2019   Dysphagia 10/20/2019   Osteoarthritis of right hand 10/31/2016   Nonischemic cardiomyopathy 04/14/2016   BMI 33.0-33.9,adult 04/14/2016    PCP: Dr. Bari EdwardLaura Berglund  REFERRING PROVIDER: Dr. Joseph BerkshireJason Matthews  REFERRING DIAG: Arthralgia of left knee  RATIONALE FOR EVALUATION AND TREATMENT: Rehabilitation  THERAPY DIAG: Chronic pain of left knee  Pain in right hip  ONSET DATE: Ongoing for years, pt unsure of start date  FOLLOW-UP APPT SCHEDULED WITH REFERRING PROVIDER: Yes   FROM INITIAL EVALUATION SUBJECTIVE:                                                                                                                                                                                         SUBJECTIVE STATEMENT:  L knee pain  PERTINENT HISTORY:  Pt reports chronic L knee pain for multiple years. No known trauma and no prior history of surgery. Pain is mostly located in the inside portion of her knee. She saw Dr. Ashley RoyaltyMatthews who ordered plain films which showed severe medial and mild-to-moderate patellofemoral compartment osteoarthritis. At the time of her evaluation it was felt that her clinical features were most consistent with medial tibiofemoral and patellofemaral arthralgia and possible chronic MCL sprains. She was prescribed a regimen of diclofenac, educated in activity modification, and advised to return in 4 weeks to consider intra-articular steroid injection if symptoms have not improved. It was also recommended that pt work with physical therapy. At this time pt would like to defer a steroid injection due to concerns of her blood  sugar regulation. Pt was previously seen by physical therapy for R hip and R knee pain which responded well  to therapy. She also has a history of chronic low back pain which started initially 33 years ago when she was pregnant. She was prescribed Gabapentin and also advised to start physical therapy as symptoms will allow.   History from 05/11/22 Pt reports history of chronic R hip pain for decades with recent onset of R knee pain in August 2023. No known trauma but pt thinks she might have aggravated her knee walking across uneven grass. She saw [name] and eventually had steroid injections in her R greater trochanter, R knee injection, and R pes bursa on 04/18/22. Radiographs (03/14/22) of R knee showed mild degenerative changes at the patellofemoral joint, worse laterally. She receives chiropractic care every 2 weeks for her low back/hip pain and also sees massage therapy once/month.   L knee radiographs: 07/26/22: IMPRESSION: Severe medial and mild-to-moderate patellofemoral compartment osteoarthritis.   PAIN:    Pain Intensity: Present: 3/10, Best: 0/10, Worst: 8/10 Pain location: Medial L knee pain radiating up inner thigh and down to the L foot; Pain Quality: sharp Radiating: Yes, up the medial L thigh and down to the L foot, occasional L toe throbbing  Numbness/Tingling: Yes, lateral L knee; Focal Weakness: Yes, L knee feels unstable and buckles, no hyperextension Aggravating factors: Walking too fast, extended standing, standing after extended sitting, steps; Relieving factors: resting, ice, elevation, heat, antiinflammatories, knee brace; 24-hour pain behavior: worsens with activity as day progresses History of prior back, hip, knee injury, pain, surgery, or therapy: Yes, history of chronic back, R hip/knee pain, and L hip pain. No prior history of orthopedic surgery to back, hip, or knee. Dominant hand: right Imaging: Yes, see history Red flags: Negative for bowel/bladder changes,  saddle paresthesia, personal history of cancer, h/o spinal tumors, h/o compression fx, h/o abdominal aneurysm, abdominal pain, chills/fever, night sweats, nausea, vomiting, and unrelenting pain;  PRECAUTIONS: None  WEIGHT BEARING RESTRICTIONS: No  FALLS: Has patient fallen in last 6 months? 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   Hobbies: Pottery (currently not limited but initially had trouble sitting at the wheel), gardening/yard work;  Patient Goals: walking across the yard with improved confidence, lifting heavier objects at home without pain in hip, working the yard, "I would like to be able to get back to walking."   OBJECTIVE:  Patient Surveys  LEFS To be completed FOTO 47, predicted improvement to 27  Cognition Patient is oriented to person, place, and time.  Recent memory is intact.  Remote memory is intact.  Attention span and concentration are intact.  Expressive speech is intact.  Patient's fund of knowledge is within normal limits for educational level.    Gross Musculoskeletal Assessment Tremor: None Bulk: Normal Tone: Normal  GAIT: Full gait assessment deferred however pt does demonstrate medial whip of R heel and bilateral ankle pronation during gait. No excessive frontal plane motion of pelvis noted. Gait does appears slightly antalgic  Posture:  Forward head/rounded shoulders in sitting, full posture assessment deferred;   AROM AROM (Normal range in degrees) AROM   Lumbar   Flexion (65) Moderate loss of segmental flexion  Extension (30) Mild loss  Right lateral flexion (25) Mild loss  Left lateral flexion (25)  Mild loss  Right rotation (30) Mild  loss  Left rotation (30) Mild loss   AROM (Normal range in degrees) AROM    Hip Right Left  Flexion (125) WNL WNL  Extension (15)    Abduction (40) 25 25  Adduction     Internal Rotation (45)    External Rotation (45)        Knee    Flexion (135) 125*, soft end feel 125*, soft end feel  Extension (0) 0 0      Ankle    Dorsiflexion (20)    (* = pain; Blank rows = not tested)  LE MMT: MMT (out of 5) Right  Left   Hip flexion 5 4+  Hip extension    Hip abduction    Hip adduction    Hip internal rotation 5 4+  Hip external rotation 5 4+  Knee flexion    Knee extension 5 5  Ankle dorsiflexion 5 4+  Ankle plantarflexion Stron5 Strong  (* = pain; Blank rows = not tested)  Sensation Deferred  Reflexes Deferred  Muscle Length Hamstrings: R: Positive for tightness at 80 degrees L: Positive for tightness at 80 degrees  Quadriceps Michela Pitcher): R: Not examined L: Not examined Hip flexors Maisie Fus): R: Not examined L: Not examined IT band Claiborne Rigg): R: Not examined L: Not examined  Palpation Location LEFT  RIGHT           Quadriceps 0   Medial Hamstrings 0   Lateral Hamstrings 0   Lateral Hamstring tendon 1   Medial Hamstring tendon 1   Quadriceps tendon 0   Patella 0   Patellar Tendon 0   Tibial Tuberosity 1   Medial joint line 1   Lateral joint line 1   MCL 1   LCL 1   Adductor Tubercle 1   Pes Anserine tendon 1   Infrapatellar fat pad 0   Fibular head 1   Popliteal fossa 0   (Blank rows = not tested) Graded on 0-4 scale (0 = no pain, 1 = pain, 2 = pain with wincing/grimacing/flinching, 3 = pain with withdrawal, 4 = unwilling to allow palpation), (Blank rows = not tested)  VASCULAR Deferred  SPECIAL TESTS  Ligamentous Stability  ACL: Lachman's: R: Not examined L: Negative Active Lachman's: R: Not examined L: Negative Anterior Drawer: R: Not examined L: Negative Pivot Shift: R: Not examined L: Negative  PCL: Posterior Drawer: R: Not examined L:  Negative Reverse Lachman's: R: Not examined L: Negative Posterior Sag Sign: R: Not examined L: Negative  MCL: Valgus Stress (30 degrees flexion): R: Not examined L: Negative  LCL: Varus Stress (30 degrees flexion): R: Not examined L: Negative  Meniscus Tests McMurray's Test:  Medial Meniscus (Tibial ER): R: Not examined L: Negative Lateral Meniscus (Tibial IR): R: Not examined L: Negative Thessaly: R: Not examined L: Not examined Ege's: R: Not examined L: Not examined Effusion: R: Not examined L: Negative   Patellofemoral Pain Syndrome Patellar Tilt (Lateral): R: Not examined L: Negative Patellar Apprehension: R: Not examined L: Not examined Squatting pain: R: Not examined L: Positive Stair climbing pain: R: Not examined L: Positive Kneeling pain: R: Not examined L: Positive Resisted knee extension pain: R: Not examined L: Negative Compression: R: Not examined L: Positive  Patellar Tendinopathy Inferior pole palpation with anterior tilt: R: Not examined L: Negative  Passive Accessory Intervertebral Motion Deferred  Hip: FABER (SN 81): R: Positive L: Negative FADIR (SN 94): R: Positive L: Negative  Hip scour (SN 50): R: Negative L: Negative Figure 4: R: Negative L: Positive for lateral L hip pain  SIJ:  Thigh Thrust (SN 88, -LR 0.18) : R: Not examined L: Not examined  Piriformis Syndrome: FAIR Test (SN 88, SP 83): R: Not examined L: Not examined  Functional Tasks Deferred   Beighton Scale: Deferred, denies history of hypermobility   TODAY'S TREATMENT    SUBJECTIVE: Pt reports that she is doing well today. No significant changes since the initial evaluation. No specific questions or concerns currently.    PAIN: Denies resting pain;   Ther-ex  NuStep L2 x 5 minutes for BLE strengthening and warm-up during interval history;  MMT R/L: Hip abduction: 26.5#, 27#, 25# (26.2#) / 17.0#, 17.0#, 19.0# (17.7#); Knee extension: 31.0#, 31.0#, 34.0# (32#)  / 34.0#,  32.5# (33.3#) Supine L SLR with 3# ankle weight (AW) 2 x 10; Supine L SAQ over bolster with heavy manual resistance 2 x 10; Hooklying clams with manual resistance 2 x 10; Hooklying adductor squeeze with manual resistance 2 x 10; R sidelying L hip abduction with 3# AW 2 x 10; R sidelying L hip reverse clam with 3# AW 2 x 10;   PATIENT EDUCATION:  Education details: Pt educated throughout session about proper posture and technique with exercises. Improved exercise technique, movement at target joints, use of target muscles after min to mod verbal, visual, tactile cues.  Person educated: Patient Education method: Explanation Education comprehension: verbalized understanding   HOME EXERCISE PROGRAM:  None currently   GOALS: Goals reviewed with patient? Yes  SHORT TERM GOALS: Target date: 09/08/2022   Pt will be independent with HEP to improve strength and decrease knee pain to improve pain-free function at home and work. Baseline:  Goal status: INITIAL   LONG TERM GOALS: Target date: 10/06/2022   Pt will increase FOTO to at least 58 to demonstrate significant improvement in function at home and work related to knee pain  Baseline: 08/11/22: 47 Goal status: INITIAL  2.  Pt will be able to bend, squat, and walk across an uneven yard without an increase in her L knee pain in order to resume working in her yard/garden as well as return to walking for exercise with less pain Baseline:  Goal status: INITIAL  3.  Pt will increase LEFS score by at least 9 points in order demonstrate clinically significant reduction in knee pain/disability.       Baseline: 08/11/22: 43/80 Goal status: INITIAL  4.  Pt will increase strength of L hip abduction to within 10% of R side in order to demonstrate improvement in strength and function and improved stability at left knee . Baseline: 08/11/22: To be completed; 08/24/22: Hip abduction R/L: 26.2#/17.7#; Goal status: INITIAL   ASSESSMENT:  CLINICAL  IMPRESSION: Patient demonstrates excellent motivation during session today. Performed additional strength testing which reveals notably weaker L hip abduction compared to R side. Quad strength is roughly symmetrical. Initiated strengthening during sesion. Plan to issue HEP at next appointment and progress strengthening. Pt will benefit from PT services to address deficits in strength, mobility, and pain in order to improve function at home and with leisure activities.   OBJECTIVE IMPAIRMENTS: Abnormal gait, decreased strength, and pain.   ACTIVITY LIMITATIONS: lifting, standing, squatting, and stairs  PARTICIPATION LIMITATIONS: cleaning, shopping, community activity, occupation, and yard work  PERSONAL FACTORS: Age, Time since onset of injury/illness/exacerbation, and 3+ comorbidities: DM, obesity, OSA, and afib  are also affecting patient's functional outcome.  REHAB POTENTIAL: Fair    CLINICAL DECISION MAKING: Unstable/unpredictable  EVALUATION COMPLEXITY: Moderate   PLAN: PT FREQUENCY: 1-2x/week  PT DURATION: 8 weeks  PLANNED INTERVENTIONS: Therapeutic exercises, Therapeutic activity, Neuromuscular re-education, Balance training, Gait training, Patient/Family education, Self Care, Joint mobilization, Joint manipulation, Vestibular training, Canalith repositioning, Orthotic/Fit training, DME instructions, Dry Needling, Electrical stimulation, Spinal manipulation, Spinal mobilization, Cryotherapy, Moist heat, Taping, Traction, Ultrasound, Ionotophoresis 4mg /ml Dexamethasone, Manual therapy, and Re-evaluation.  PLAN FOR NEXT SESSION: Measure bilateral hip IR/ER, progress strengthening, issue HEP;  Sharalyn Ink Fidela Cieslak PT, DPT, GCS  Joniece Smotherman, PT 08/27/2022, 5:05 PM

## 2022-08-25 ENCOUNTER — Ambulatory Visit (INDEPENDENT_AMBULATORY_CARE_PROVIDER_SITE_OTHER): Payer: Medicare Other | Admitting: Family Medicine

## 2022-08-25 ENCOUNTER — Encounter: Payer: Self-pay | Admitting: Family Medicine

## 2022-08-25 VITALS — BP 126/86 | HR 78 | Ht 67.0 in | Wt 236.0 lb

## 2022-08-25 DIAGNOSIS — M5416 Radiculopathy, lumbar region: Secondary | ICD-10-CM | POA: Diagnosis not present

## 2022-08-31 NOTE — Therapy (Signed)
OUTPATIENT PHYSICAL THERAPY KNEE TREATMENT  Patient Name: Brandy [name] MRN: [id number] DOB:Oct 04, 1955, 67 y.o., female Today's Date: 09/02/2022  END OF SESSION:  PT End of Session - 09/01/22 1059     Visit Number 3    Number of Visits 17    Date for PT Re-Evaluation 10/06/22    Authorization Type [employer] [facility] [place] [facility]    Authorization Time Period eval: 08/11/22    PT Start Time 1055    PT Stop Time 1140    PT Time Calculation (min) 45 min    Activity Tolerance Patient tolerated treatment well    Behavior During Therapy [facility] for tasks assessed/performed            Past Medical History:  Diagnosis Date   Acute lower GI bleeding 01/19/2020   Arthritis    Atrial fibrillation with rapid ventricular response    Chicken pox    Chronic systolic CHF (congestive heart failure)    a. 03/2016 Echo: Ef 15-20%, diff HK, ant AK;  b. 05/2016 Echo: Ef 30-35%, diff HK, mildly dil LA/RA.   Diabetes mellitus without complication    Dysrhythmia    GERD (gastroesophageal reflux disease)    Heart murmur    Hip discomfort    been going on for 40 years    History of kidney stones    Hyperlipidemia associated with type 2 diabetes mellitus 05/12/2021   Hypertension    Moderate mitral regurgitation    a. 03/2016 Echo: mod MR in setting of LV dysfxn.   Motion sickness    car - back seat   NICM (nonischemic cardiomyopathy)    a. 03/2016 Echo: EF 15-20%, diff HK, ant AK, mod MR, mod dil LA, mildly dil RA;  b. 04/2016 Cath: nl cors;  c. 05/2016 Echo: EF 30-35%, diff HK.   Obesity    Obstructive sleep apnea    compliant with CPAP   Persistent atrial fibrillation    a. Dx 03/2016;  b. CHA2DS2VASc = 2-->Eliquis 5mg  BID;  b. 05/2016 Failed DCCV x 4.   Sleep apnea    Stomach irritation    Visit for monitoring Tikosyn therapy 07/24/2016   Past Surgical History:  Procedure Laterality Date   ATRIAL FIBRILLATION ABLATION N/A 12/09/2020   Procedure: ATRIAL FIBRILLATION ABLATION;  Surgeon: Lanier Prude, MD;  Location: [facility] INVASIVE CV LAB;  Service: Cardiovascular;  Laterality: N/A;   BIOPSY N/A 01/12/2020   Procedure: BIOPSY;  Surgeon: Pasty Spillers, MD;  Location: [facility];  Service: Endoscopy;  Laterality: N/A;   CARDIAC CATHETERIZATION N/A 04/17/2016   Procedure: Left Heart Cath and Coronary Angiography;  Surgeon: Iran Ouch, MD;  Location: [facility] INVASIVE CV LAB;  Service: Cardiovascular;  Laterality: N/A;   CARDIOVERSION N/A 10/22/2020   Procedure: CARDIOVERSION;  Surgeon: Yvonne Kendall, MD;  Location: [facility] ORS;  Service: Cardiovascular;  Laterality: N/A;   CHOLECYSTECTOMY     COLON SURGERY  2021   COLONOSCOPY N/A 01/20/2020   Procedure: COLONOSCOPY;  Surgeon: Regis Bill, MD;  Location: [facility] ENDOSCOPY;  Service: Endoscopy;  Laterality: N/A;   COLONOSCOPY WITH PROPOFOL N/A 01/12/2020   Procedure: COLONOSCOPY WITH PROPOFOL;  Surgeon: Pasty Spillers, MD;  Location: [facility];  Service: Endoscopy;  Laterality: N/A;  Diabetic - oral meds sleep apnea   ELECTROPHYSIOLOGIC STUDY N/A 05/26/2016   Procedure: Cardioversion;  Surgeon: Iran Ouch, MD;  Location: [facility] ORS;  Service: Cardiovascular;  Laterality: N/A;   ESOPHAGOGASTRODUODENOSCOPY (EGD) WITH PROPOFOL  N/A 01/12/2020   Procedure: ESOPHAGOGASTRODUODENOSCOPY (EGD) WITH PROPOFOL;  Surgeon: Pasty Spillers, MD;  Location: [facility] SURGERY CNTR;  Service: Endoscopy;  Laterality: N/A;   POLYPECTOMY N/A 01/12/2020   Procedure: POLYPECTOMY;  Surgeon: Pasty Spillers, MD;  Location: [facility] SURGERY [facility];  Service: Endoscopy;  Laterality: N/A;   Patient Active Problem List   Diagnosis Date Noted   Left lumbar radiculopathy 07/26/2022   Arthralgia of left knee 07/26/2022   Pes anserinus bursitis of right knee 04/19/2022   Primary osteoarthritis of right knee 03/27/2022   Greater 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 03/27/2022   It band syndrome, right 03/27/2022    Hyperlipidemia associated with type 2 diabetes mellitus 05/12/2021   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   Varicose veins of leg with pain, bilateral 07/06/2020   Acquired thrombophilia 05/24/2020   Type II diabetes mellitus with complication 01/19/2020   Gastric polyp    Colon polyps    Varicose veins of both lower extremities 10/20/2019   Dysphagia 10/20/2019   Osteoarthritis of right hand 10/31/2016   Nonischemic cardiomyopathy 04/14/2016   BMI 33.0-33.9,adult 04/14/2016   PCP: Dr. Bari Edward  REFERRING PROVIDER: Dr. Joseph Berkshire  REFERRING DIAG: Arthralgia of left knee  RATIONALE FOR EVALUATION AND TREATMENT: Rehabilitation  THERAPY DIAG: Chronic pain of left knee  Pain in right hip  Chronic pain of right knee  ONSET DATE: Ongoing for years, pt unsure of start date  FOLLOW-UP APPT SCHEDULED WITH REFERRING PROVIDER: Yes   FROM INITIAL EVALUATION SUBJECTIVE:                                                                                                                                                                                         SUBJECTIVE STATEMENT:  L knee pain  PERTINENT HISTORY:  Pt reports chronic L knee pain for multiple years. No known trauma and no prior history of surgery. Pain is mostly located in the inside portion of her knee. She saw [name] who ordered plain films which showed severe medial and mild-to-moderate patellofemoral compartment osteoarthritis. At the time of her evaluation it was felt that her clinical features were most consistent with medial tibiofemoral and patellofemaral arthralgia and possible chronic MCL sprains. She was prescribed a regimen of diclofenac, educated in activity modification, and advised to return in 4 weeks to consider intra-articular steroid injection if symptoms have not improved. It was also recommended that pt work with physical therapy. At this time pt would like to defer a steroid injection  due to concerns of her blood sugar regulation. Pt was previously seen by physical therapy for R hip and R  knee pain which responded well to therapy. She also has a history of chronic low back pain which started initially 33 years ago when she was pregnant. She was prescribed Gabapentin and also advised to start physical therapy as symptoms will allow.  History from 05/11/22 Pt reports history of chronic R hip pain for decades with recent onset of R knee pain in August 2023. No known trauma but pt thinks she might have aggravated her knee walking across uneven grass. She saw [name] and eventually had steroid injections in her R greater trochanter, R knee injection, and R pes bursa on 04/18/22. Radiographs (03/14/22) of R knee showed mild degenerative changes at the patellofemoral joint, worse laterally. She receives chiropractic care every 2 weeks for her low back/hip pain and also sees massage therapy once/month.   L knee radiographs: 07/26/22: IMPRESSION: Severe medial and mild-to-moderate patellofemoral compartment osteoarthritis.   PAIN:    Pain Intensity: Present: 3/10, Best: 0/10, Worst: 8/10 Pain location: Medial L knee pain radiating up inner thigh and down to the L foot; Pain Quality: sharp Radiating: Yes, up the medial L thigh and down to the L foot, occasional L toe throbbing  Numbness/Tingling: Yes, lateral L knee; Focal Weakness: Yes, L knee feels unstable and buckles, no hyperextension Aggravating factors: Walking too fast, extended standing, standing after extended sitting, steps; Relieving factors: resting, ice, elevation, heat, antiinflammatories, knee brace; 24-hour pain behavior: worsens with activity as day progresses History of prior back, hip, knee injury, pain, surgery, or therapy: Yes, history of chronic back, R hip/knee pain, and L hip pain. No prior history of orthopedic surgery to back, hip, or knee. Dominant hand: right Imaging: Yes, see history Red flags: Negative  for bowel/bladder changes, saddle paresthesia, personal history of cancer, h/o spinal tumors, h/o compression fx, h/o abdominal aneurysm, abdominal pain, chills/fever, night sweats, nausea, vomiting, and unrelenting pain;  PRECAUTIONS: None  WEIGHT BEARING RESTRICTIONS: No  FALLS: Has patient fallen in last 6 months? 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   Hobbies: Pottery (currently not limited but initially had trouble sitting at the wheel), gardening/yard work;  Patient Goals: walking across the yard with improved confidence, lifting heavier objects at home without pain in hip, working the yard, "I would like to be able to get back to walking."   OBJECTIVE:  Patient Surveys  LEFS To be completed FOTO 47, predicted improvement to 76  Cognition Patient is oriented to person, place, and time.  Recent memory is intact.  Remote memory is intact.  Attention span and concentration are intact.  Expressive speech is intact.  Patient's fund of knowledge is within normal limits for educational level.    Gross Musculoskeletal Assessment Tremor: None Bulk: Normal Tone: Normal  GAIT: Full gait assessment deferred however pt does demonstrate medial whip of R heel and bilateral ankle pronation during gait. No excessive frontal plane motion of pelvis noted. Gait does appears slightly antalgic  Posture:  Forward head/rounded shoulders in sitting, full posture assessment deferred;   AROM AROM (Normal range in degrees) AROM   Lumbar   Flexion (65) Moderate loss of segmental flexion  Extension (30) Mild loss  Right lateral flexion (25) Mild loss  Left lateral flexion (25) Mild loss   Right rotation (30) Mild loss  Left rotation (30) Mild loss   AROM (Normal range in degrees) AROM    Hip Right Left  Flexion (125) WNL WNL  Extension (15)    Abduction (40) 25 25  Adduction     Internal Rotation (45)    External Rotation (45)        Knee    Flexion (135) 125*, soft end feel 125*, soft end feel  Extension (0) 0 0      Ankle    Dorsiflexion (20)    (* = pain; Blank rows = not tested)  LE MMT: MMT (out of 5) Right  Left   Hip flexion 5 4+  Hip extension    Hip abduction    Hip adduction    Hip internal rotation 5 4+  Hip external rotation 5 4+  Knee flexion    Knee extension 5 5  Ankle dorsiflexion 5 4+  Ankle plantarflexion Stron5 Strong  (* = pain; Blank rows = not tested)  Sensation Deferred  Reflexes Deferred  Muscle Length Hamstrings: R: Positive for tightness at 80 degrees L: Positive for tightness at 80 degrees  Quadriceps Michela Pitcher): R: Not examined L: Not examined Hip flexors Maisie Fus): R: Not examined L: Not examined IT band Claiborne Rigg): R: Not examined L: Not examined  Palpation Location LEFT  RIGHT           Quadriceps 0   Medial Hamstrings 0   Lateral Hamstrings 0   Lateral Hamstring tendon 1   Medial Hamstring tendon 1   Quadriceps tendon 0   Patella 0   Patellar Tendon 0   Tibial Tuberosity 1   Medial joint line 1   Lateral joint line 1   MCL 1   LCL 1   Adductor Tubercle 1   Pes Anserine tendon 1   Infrapatellar fat pad 0   Fibular head 1   Popliteal fossa 0   (Blank rows = not tested) Graded on 0-4 scale (0 = no pain, 1 = pain, 2 = pain with wincing/grimacing/flinching, 3 = pain with withdrawal, 4 = unwilling to allow palpation), (Blank rows = not tested)  VASCULAR Deferred  SPECIAL TESTS  Ligamentous Stability  ACL: Lachman's: R: Not examined L: Negative Active Lachman's: R: Not examined L: Negative Anterior Drawer: R: Not examined L: Negative Pivot Shift: R: Not examined L: Negative  PCL: Posterior Drawer: R:  Not examined L: Negative Reverse Lachman's: R: Not examined L: Negative Posterior Sag Sign: R: Not examined L: Negative  MCL: Valgus Stress (30 degrees flexion): R: Not examined L: Negative  LCL: Varus Stress (30 degrees flexion): R: Not examined L: Negative  Meniscus Tests McMurray's Test:  Medial Meniscus (Tibial ER): R: Not examined L: Negative Lateral Meniscus (Tibial IR): R: Not examined L: Negative Thessaly: R: Not examined L: Not examined Ege's: R: Not examined L: Not examined Effusion: R: Not examined L: Negative   Patellofemoral Pain Syndrome Patellar Tilt (Lateral): R: Not examined L: Negative Patellar Apprehension: R: Not examined L: Not examined Squatting pain: R: Not examined L: Positive Stair climbing pain: R: Not examined L: Positive Kneeling pain: R: Not examined L: Positive Resisted knee extension pain: R: Not examined L: Negative Compression: R: Not examined L: Positive  Patellar Tendinopathy Inferior pole palpation with anterior tilt: R: Not examined L: Negative  Passive Accessory Intervertebral Motion Deferred  Hip: FABER (SN 81): R: Positive L: Negative FADIR (SN 94):  R: Positive L: Negative Hip scour (SN 50): R: Negative L: Negative Figure 4: R: Negative L: Positive for lateral L hip pain  SIJ:  Thigh Thrust (SN 88, -LR 0.18) : R: Not examined L: Not examined  Piriformis Syndrome: FAIR Test (SN 88, SP 83): R: Not examined L: Not examined  Functional Tasks Deferred   Beighton Scale: Deferred, denies history of hypermobility   TODAY'S TREATMENT    SUBJECTIVE: Pt reports that she is doing well today. She states that [name] wanted her to try to taper down on the gabapentin to see how much of her pain was nerve related and how much pain was related to inflammation. No specific questions or concerns currently.    PAIN: Denies resting pain;   Ther-ex  NuStep L2-3 x 5 minutes for BLE strengthening and warm-up during interval  history; Total Gym (TG) Level 22 (L22) double leg squats 2 x 15; TG L22 double leg heel raises 2 x 20; Supine SLR with 4# ankle weight (AW) 2 x 10 BLE; Supine L SAQ over bolster with heavy manual resistance 2 x 10; Hooklying clams with manual resistance 2 x 10; Hooklying adductor squeeze with manual resistance 2 x 10; Sidelying hip abduction with 4# AW 2 x 10 BLE; Sidelying reverse clam with 4# AW 2 x 10 BLE;   PATIENT EDUCATION:  Education details: Pt educated throughout session about proper posture and technique with exercises. Improved exercise technique, movement at target joints, use of target muscles after min to mod verbal, visual, tactile cues.  Person educated: Patient Education method: Explanation Education comprehension: verbalized understanding   HOME EXERCISE PROGRAM:  None currently   GOALS: Goals reviewed with patient? Yes  SHORT TERM GOALS: Target date: 09/08/2022   Pt will be independent with HEP to improve strength and decrease knee pain to improve pain-free function at home and work. Baseline:  Goal status: INITIAL   LONG TERM GOALS: Target date: 10/06/2022   Pt will increase FOTO to at least 58 to demonstrate significant improvement in function at home and work related to knee pain  Baseline: 08/11/22: 47 Goal status: INITIAL  2.  Pt will be able to bend, squat, and walk across an uneven yard without an increase in her L knee pain in order to resume working in her yard/garden as well as return to walking for exercise with less pain Baseline:  Goal status: INITIAL  3.  Pt will increase LEFS score by at least 9 points in order demonstrate clinically significant reduction in knee pain/disability.       Baseline: 08/11/22: 43/80 Goal status: INITIAL  4.  Pt will increase strength of L hip abduction to within 10% of R side in order to demonstrate improvement in strength and function and improved stability at left knee . Baseline: 08/11/22: To be completed;  08/24/22: Hip abduction R/L: 26.2#/17.7#; Goal status: INITIAL   ASSESSMENT:  CLINICAL IMPRESSION: Patient demonstrates excellent motivation during session today. Progressed strengthening during session with patient. Utilized decreased WB positions in order to limit L knee pain. Pt encouraged to follow-up as scheduled. Plan to progress strengthening in future session. Pt will benefit from PT services to address deficits in strength, mobility, and pain in order to improve function at home and with leisure activities.   OBJECTIVE IMPAIRMENTS: Abnormal gait, decreased strength, and pain.   ACTIVITY LIMITATIONS: lifting, standing, squatting, and stairs  PARTICIPATION LIMITATIONS: cleaning, shopping, community activity, occupation, and yard work  PERSONAL FACTORS: Age, Time since onset  of injury/illness/exacerbation, and 3+ comorbidities: DM, obesity, OSA, and afib  are also affecting patient's functional outcome.   REHAB POTENTIAL: Fair    CLINICAL DECISION MAKING: Unstable/unpredictable  EVALUATION COMPLEXITY: Moderate   PLAN: PT FREQUENCY: 1-2x/week  PT DURATION: 8 weeks  PLANNED INTERVENTIONS: Therapeutic exercises, Therapeutic activity, Neuromuscular re-education, Balance training, Gait training, Patient/Family education, Self Care, Joint mobilization, Joint manipulation, Vestibular training, Canalith repositioning, Orthotic/Fit training, DME instructions, Dry Needling, Electrical stimulation, Spinal manipulation, Spinal mobilization, Cryotherapy, Moist heat, Taping, Traction, Ultrasound, Ionotophoresis 4mg /ml Dexamethasone, Manual therapy, and Re-evaluation.  PLAN FOR NEXT SESSION: Measure bilateral hip IR/ER, progress strengthening, issue HEP;  Sharalyn Ink [facility] PT, DPT, GCS  Annett Boxwell, PT 09/02/2022, 3:01 PM

## 2022-09-01 ENCOUNTER — Other Ambulatory Visit: Payer: Self-pay | Admitting: Family Medicine

## 2022-09-01 ENCOUNTER — Telehealth: Payer: Self-pay | Admitting: Internal Medicine

## 2022-09-01 ENCOUNTER — Ambulatory Visit: Payer: Medicare Other

## 2022-09-01 DIAGNOSIS — M5416 Radiculopathy, lumbar region: Secondary | ICD-10-CM

## 2022-09-01 DIAGNOSIS — M25561 Pain in right knee: Secondary | ICD-10-CM | POA: Diagnosis not present

## 2022-09-01 DIAGNOSIS — M25551 Pain in right hip: Secondary | ICD-10-CM | POA: Diagnosis not present

## 2022-09-01 DIAGNOSIS — G8929 Other chronic pain: Secondary | ICD-10-CM

## 2022-09-01 DIAGNOSIS — M25562 Pain in left knee: Secondary | ICD-10-CM | POA: Diagnosis not present

## 2022-09-01 NOTE — Telephone Encounter (Signed)
Please advise 

## 2022-09-01 NOTE — Telephone Encounter (Signed)
Medication Refill - Medication: diclofenac (VOLTAREN) 50 MG EC tablet  and gabapentin (NEURONTIN) 100 MG capsule  Has the patient contacted their pharmacy? Yes.   Pt told to contact provider  Preferred Pharmacy (with phone number or street name):  [facility] [address] [place] - [address] Phone: [phone]  Fax: [phone]     Has the patient been seen for an appointment in the last year OR does the patient have an upcoming appointment? Yes.    Agent: Please be advised that RX refills may take up to 3 business days. We ask that you follow-up with your pharmacy.

## 2022-09-01 NOTE — Telephone Encounter (Signed)
Requested medication (s) are due for refill today: Yes  Requested medication (s) are on the active medication list: Yes  Last refill:  07/26/22  Future visit scheduled: Yes  Notes to clinic:  See request.    Requested Prescriptions  Pending Prescriptions Disp Refills   diclofenac (VOLTAREN) 50 MG EC tablet [Pharmacy Med Name: Diclofenac Sodium 50 MG Oral Tablet Delayed Release] 60 tablet 0    Sig: Take 1 tablet by mouth twice daily as needed     Analgesics:  NSAIDS Failed - 09/01/2022 11:53 AM      Failed - Manual Review: Labs are only required if the patient has taken medication for more than 8 weeks.      Failed - HGB in normal range and within 360 days    Hemoglobin  Date Value Ref Range Status  11/16/2020 14.6 12.0 - 15.0 g/dL Final  53/66/4403 47.4 (H) 11.1 - 15.9 g/dL Final         Failed - PLT in normal range and within 360 days    Platelets  Date Value Ref Range Status  11/16/2020 267 150 - 400 K/uL Final  09/21/2020 310 150 - 450 x10E3/uL Final         Failed - HCT in normal range and within 360 days    HCT  Date Value Ref Range Status  11/16/2020 42.7 36.0 - 46.0 % Final   Hematocrit  Date Value Ref Range Status  09/21/2020 47.8 (H) 34.0 - 46.6 % Final         Passed - Cr in normal range and within 360 days    Creatinine  Date Value Ref Range Status  07/31/2011 1.04 0.60 - 1.30 mg/dL Final   Creatinine, Ser  Date Value Ref Range Status  03/20/2022 0.79 0.57 - 1.00 mg/dL Final         Passed - eGFR is 30 or above and within 360 days    EGFR (African American)  Date Value Ref Range Status  07/31/2011 >60 >39mL/min Final   GFR calc Af Amer  Date Value Ref Range Status  01/24/2020 >60 >60 mL/min Final   EGFR (Non-African Amer.)  Date Value Ref Range Status  07/31/2011 58 (L) >28mL/min Final    Comment:    eGFR values <59mL/min/1.73 m2 may be an indication of chronic kidney disease (CKD). Calculated eGFR, using the MRDR Study equation, is  useful in  patients with stable renal function. The eGFR calculation will not be reliable in acutely ill patients when serum creatinine is changing rapidly. It is not useful in patients on dialysis. The eGFR calculation may not be applicable to patients at the low and high extremes of body sizes, pregnant women, and vegetarians.    GFR, Estimated  Date Value Ref Range Status  11/16/2020 >60 >60 mL/min Final    Comment:    (NOTE) Calculated using the CKD-EPI Creatinine Equation (2021)    eGFR  Date Value Ref Range Status  03/20/2022 82 >59 mL/min/1.73 Final         Passed - Patient is not pregnant      Passed - Valid encounter within last 12 months    Recent Outpatient Visits           1 week ago    [facility] at [facility], Ocie Bob, MD   1 month ago Left lumbar radiculopathy   [facility] at [facility], Barbara Cower  J, MD   4 months ago Primary osteoarthritis of right knee   [place] [facility] at [facility], Ocie Bob, MD   4 months ago Primary osteoarthritis of right knee   [facility] at [facility], Ocie Bob, MD   5 months ago Primary osteoarthritis of right knee   [facility] Medicine at [facility], Ocie Bob, MD       Future Appointments             In 5 days Judithann Graves Nyoka Cowden, MD [facility] Primary Care & Sports Medicine at [facility], [facility]             gabapentin (NEURONTIN) 100 MG capsule [Pharmacy Med Name: Gabapentin 100 MG Oral Capsule] 30 capsule 0    Sig: Take 1 capsule by mouth at bedtime     Neurology: Anticonvulsants - gabapentin Passed - 09/01/2022 11:53 AM      Passed - Cr in normal range and within 360 days    Creatinine  Date Value Ref Range Status  07/31/2011 1.04 0.60 - 1.30 mg/dL Final   Creatinine, Ser  Date Value Ref Range  Status  03/20/2022 0.79 0.57 - 1.00 mg/dL Final         Passed - Completed PHQ-2 or PHQ-9 in the last 360 days      Passed - Valid encounter within last 12 months    Recent Outpatient Visits           1 week ago    [facility] & Sports Medicine at [facility], Ocie Bob, MD   1 month ago Left lumbar radiculopathy   [place] [facility] at [facility], Ocie Bob, MD   4 months ago Primary osteoarthritis of right knee   [facility] Primary Care & Sports Medicine at [facility], Ocie Bob, MD   4 months ago Primary osteoarthritis of right knee   [facility] Primary Care & [facility] Medicine at [facility], Ocie Bob, MD   5 months ago Primary osteoarthritis of right knee   [facility] Care & [facility] Medicine at [facility], Ocie Bob, MD       Future Appointments             In 5 days Judithann Graves Nyoka Cowden, MD [facility] Primary Care & Sports Medicine at [facility], [facility]

## 2022-09-03 MED ORDER — GABAPENTIN 100 MG PO CAPS: 100.0000 mg | ORAL_CAPSULE | Freq: Every day | ORAL | 0 refills | Status: DC

## 2022-09-03 MED ORDER — DICLOFENAC SODIUM 50 MG PO TBEC: 50.0000 mg | DELAYED_RELEASE_TABLET | Freq: Two times a day (BID) | ORAL | 0 refills | Status: DC | PRN

## 2022-09-03 NOTE — Patient Instructions (Addendum)
-   Take 1-3 capsules gabapentin nightly as-needed - Take diclofenac twice daily as-needed (take with food) - Continue physical therapy - Follow-up as-needed

## 2022-09-03 NOTE — Progress Notes (Signed)
     Primary Care / Sports Medicine Office Visit  Patient Information:  Patient ID: Brandy [name], female DOB: 20-Jul-1955 Age: 67 y.o. MRN: [id number]   Brandy [name] is a pleasant 67 y.o. female presenting with the following:  Chief Complaint  Patient presents with   Knee Pain    Still aches at times    Vitals:   08/25/22 1421  BP: 126/86  Pulse: 78  SpO2: 98%   Vitals:   08/25/22 1421  Weight: 236 lb (107 kg)  Height:  (1.702 m)   Body mass index is 36.96 kg/m.  No results found.   Independent interpretation of notes and tests performed by another provider:   None  Procedures performed:   None  Pertinent History, Exam, Impression, and Recommendations:   Brandy [name] was seen today for knee pain.  Left lumbar radiculopathy Assessment & Plan: Steady improvement with physical therapy and medications. Still symptomatic, primarily at night / end of day.  - Titrate gabapentin to a maximum of 300 mg nightly - Dose diclofenac twice daily as-needed - Follow-up as-needed  Orders: -     Gabapentin; Take 1-3 capsules (100-300 mg total) by mouth at bedtime.  Dispense: 45 capsule; Refill: 0 -     Diclofenac Sodium; Take 1 tablet (50 mg total) by mouth 2 (two) times daily as needed.  Dispense: 60 tablet; Refill: 0     Orders & Medications Meds ordered this encounter  Medications   gabapentin (NEURONTIN) 100 MG capsule    Sig: Take 1-3 capsules (100-300 mg total) by mouth at bedtime.    Dispense:  45 capsule    Refill:  0   diclofenac (VOLTAREN) 50 MG EC tablet    Sig: Take 1 tablet (50 mg total) by mouth 2 (two) times daily as needed.    Dispense:  60 tablet    Refill:  0   No orders of the defined types were placed in this encounter.    Return if symptoms worsen or fail to improve.     [name], MD, [facility]   Primary Care Sports Medicine Primary Care and Sports Medicine at [facility]

## 2022-09-03 NOTE — Assessment & Plan Note (Signed)
Steady improvement with physical therapy and medications. Still symptomatic, primarily at night / end of day.  - Titrate gabapentin to a maximum of 300 mg nightly - Dose diclofenac twice daily as-needed - Follow-up as-needed

## 2022-09-04 ENCOUNTER — Ambulatory Visit (INDEPENDENT_AMBULATORY_CARE_PROVIDER_SITE_OTHER): Payer: Medicare Other

## 2022-09-04 DIAGNOSIS — I4819 Other persistent atrial fibrillation: Secondary | ICD-10-CM

## 2022-09-05 LAB — CUP PACEART REMOTE DEVICE CHECK
Date Time Interrogation Session: 20240419230521
Implantable Pulse Generator Implant Date: 20230928

## 2022-09-06 ENCOUNTER — Telehealth: Payer: Self-pay | Admitting: Internal Medicine

## 2022-09-06 ENCOUNTER — Encounter: Payer: Self-pay | Admitting: Internal Medicine

## 2022-09-06 ENCOUNTER — Ambulatory Visit (INDEPENDENT_AMBULATORY_CARE_PROVIDER_SITE_OTHER): Payer: Medicare Other | Admitting: Internal Medicine

## 2022-09-06 VITALS — BP 128/70 | HR 63 | Ht 67.0 in | Wt 233.0 lb

## 2022-09-06 DIAGNOSIS — M79672 Pain in left foot: Secondary | ICD-10-CM

## 2022-09-06 DIAGNOSIS — E785 Hyperlipidemia, unspecified: Secondary | ICD-10-CM

## 2022-09-06 DIAGNOSIS — E1169 Type 2 diabetes mellitus with other specified complication: Secondary | ICD-10-CM

## 2022-09-06 DIAGNOSIS — R609 Edema, unspecified: Secondary | ICD-10-CM

## 2022-09-06 DIAGNOSIS — E118 Type 2 diabetes mellitus with unspecified complications: Secondary | ICD-10-CM | POA: Diagnosis not present

## 2022-09-06 LAB — POCT GLYCOSYLATED HEMOGLOBIN (HGB A1C): Hemoglobin A1C: 6 % — AB (ref 4.0–5.6)

## 2022-09-06 MED ORDER — FUROSEMIDE 20 MG PO TABS: 20.0000 mg | ORAL_TABLET | Freq: Every day | ORAL | 0 refills | Status: AC | PRN

## 2022-09-06 NOTE — Addendum Note (Signed)
Addended by: Mariel Sleet on: 09/06/2022 11:15 AM   Modules accepted: Orders

## 2022-09-06 NOTE — Assessment & Plan Note (Addendum)
Clinically stable without s/s of hypoglycemia. Tolerating metformin well without side effects. She is concerned about the weight gain and wonders if metformin is not working as well. Lab Results  Component Value Date   HGBA1C 5.8 (H) 03/20/2022  Will reduce metformin to 250 mg qam Add Farxiga 10 mg daily - sample given.  Call for Rx if cost is affordable.

## 2022-09-06 NOTE — Patient Instructions (Signed)
Take Farxiga 10 mg once a day.  Stop the evening metformin.  Check on coverage.

## 2022-09-06 NOTE — Telephone Encounter (Signed)
Copied from CRM [id number]. Topic: Medicare AWV >> Sep 06, 2022  2:06 PM Payton Doughty wrote: Reason for CRM: Called patient to schedule Medicare Annual Wellness Visit (AWV). Left message for patient to call back and schedule Medicare Annual Wellness Visit (AWV).  Last date of AWV: 09/26/21  Please schedule an appointment at any time with Kennedy Bucker, LPN  .  If any questions, please contact me.  Thank you ,  Verlee Rossetti; Care Guide Ambulatory Clinical Support [facility] Direct Dial: [phone]

## 2022-09-06 NOTE — Assessment & Plan Note (Addendum)
Not on appropriate statin therapy. She was on Crestor once a week previously. Lab Results  Component Value Date   LDLCALC 97 03/20/2022

## 2022-09-06 NOTE — Progress Notes (Signed)
Date:  09/06/2022   Name:  Brandy [name]   DOB:  1955/10/18   MRN:  [id number]   Chief Complaint: Diabetes  Diabetes She presents for her follow-up diabetic visit. She has type 2 diabetes mellitus. Pertinent negatives for hypoglycemia include no headaches, nervousness/anxiousness or tremors. Pertinent negatives for diabetes include no chest pain, no fatigue, no polydipsia and no polyuria. Symptoms are stable. Current diabetic treatment includes oral agent (monotherapy) (metformin). An ACE inhibitor/angiotensin II receptor blocker is not being taken. She does not see a podiatrist.Eye exam is current.    Lab Results  Component Value Date   NA 143 03/20/2022   K 4.5 03/20/2022   CO2 21 03/20/2022   GLUCOSE 96 03/20/2022   BUN 14 03/20/2022   CREATININE 0.79 03/20/2022   CALCIUM 9.4 03/20/2022   EGFR 82 03/20/2022   GFRNONAA >60 11/16/2020   Lab Results  Component Value Date   CHOL 166 03/20/2022   HDL 46 03/20/2022   LDLCALC 97 03/20/2022   LDLDIRECT 131.0 10/31/2016   TRIG 127 03/20/2022   CHOLHDL 3.6 03/20/2022   Lab Results  Component Value Date   TSH 1.460 08/01/2021   Lab Results  Component Value Date   HGBA1C 6.0 (A) 09/06/2022   Lab Results  Component Value Date   WBC 6.5 11/16/2020   HGB 14.6 11/16/2020   HCT 42.7 11/16/2020   MCV 92.4 11/16/2020   PLT 267 11/16/2020   Lab Results  Component Value Date   ALT 14 03/20/2022   AST 14 03/20/2022   ALKPHOS 94 03/20/2022   BILITOT 0.4 03/20/2022   Lab Results  Component Value Date   VD25OH 38.71 10/31/2016     Review of Systems  Constitutional:  Negative for appetite change, fatigue, fever and unexpected weight change.  HENT:  Negative for tinnitus and trouble swallowing.   Eyes:  Negative for visual disturbance.  Respiratory:  Negative for cough, chest tightness and shortness of breath.   Cardiovascular:  Negative for chest pain, palpitations and leg swelling.  Gastrointestinal:  Negative for  abdominal pain.  Endocrine: Negative for polydipsia and polyuria.  Genitourinary:  Negative for dysuria and hematuria.  Musculoskeletal:  Positive for arthralgias and gait problem (swelling and pain top of left foot).  Neurological:  Negative for tremors, numbness and headaches.  Psychiatric/Behavioral:  Negative for dysphoric mood and sleep disturbance. The patient is not nervous/anxious.     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   Varicose veins of leg with pain, bilateral 07/06/2020   Acquired thrombophilia 05/24/2020   Type II diabetes mellitus with complication 01/19/2020   Gastric polyp    Colon polyps    Varicose veins of both lower extremities 10/20/2019   Dysphagia 10/20/2019   Osteoarthritis of right hand 10/31/2016   Nonischemic cardiomyopathy 04/14/2016   BMI 33.0-33.9,adult 04/14/2016    No Known Allergies  Past Surgical History:  Procedure Laterality Date   ATRIAL FIBRILLATION ABLATION N/A 12/09/2020   Procedure: ATRIAL FIBRILLATION ABLATION;  Surgeon: Lanier Prude, MD;  Location: [facility] INVASIVE CV LAB;  Service: Cardiovascular;  Laterality:  N/A;   BIOPSY N/A 01/12/2020   Procedure: BIOPSY;  Surgeon: Pasty Spillers, MD;  Location: [facility];  Service: Endoscopy;  Laterality: N/A;   CARDIAC CATHETERIZATION N/A 04/17/2016   Procedure: Left Heart Cath and Coronary Angiography;  Surgeon: Iran Ouch, MD;  Location: [facility] INVASIVE CV LAB;  Service: Cardiovascular;  Laterality: N/A;   CARDIOVERSION N/A 10/22/2020   Procedure:  CARDIOVERSION;  Surgeon: Yvonne Kendall, MD;  Location: [facility] ORS;  Service: Cardiovascular;  Laterality: N/A;   CHOLECYSTECTOMY     COLON SURGERY  2021   COLONOSCOPY N/A 01/20/2020   Procedure: COLONOSCOPY;  Surgeon: Regis Bill, MD;  Location: [facility] ENDOSCOPY;  Service: Endoscopy;  Laterality: N/A;   COLONOSCOPY WITH PROPOFOL N/A 01/12/2020   Procedure: COLONOSCOPY WITH PROPOFOL;  Surgeon: Pasty Spillers, MD;  Location: [facility];  Service: Endoscopy;  Laterality: N/A;  Diabetic - oral meds sleep apnea   ELECTROPHYSIOLOGIC STUDY N/A 05/26/2016   Procedure: Cardioversion;  Surgeon: Iran Ouch, MD;  Location: [facility] ORS;  Service: Cardiovascular;  Laterality: N/A;   ESOPHAGOGASTRODUODENOSCOPY (EGD) WITH PROPOFOL N/A 01/12/2020   Procedure: ESOPHAGOGASTRODUODENOSCOPY (EGD) WITH PROPOFOL;  Surgeon: Pasty Spillers, MD;  Location: [facility];  Service: Endoscopy;  Laterality: N/A;   POLYPECTOMY N/A 01/12/2020   Procedure: POLYPECTOMY;  Surgeon: Pasty Spillers, MD;  Location: [facility] SUR[facility];  Service: Endoscopy;  Laterality: N/A;    Social History   Tobacco Use   Smoking status: Never   Smokeless tobacco: Never  Vaping Use   Vaping Use: Never used  Substance Use Topics   Alcohol use: Never   Drug use: Never     Medication list has been reviewed and updated.  Current Meds  Medication Sig   CALCIUM PO Take by mouth daily.   Celery Seed OIL Using as needed to help with constipation   Cholecalciferol (VITAMIN D3) 1000 units CAPS Take 1,000 Units by mouth 2 (two) times daily.   diclofenac (VOLTAREN) 50 MG EC tablet Take 1 tablet (50 mg total) by mouth 2 (two) times daily as needed.   gabapentin (NEURONTIN) 100 MG capsule Take 1-3 capsules (100-300 mg total) by mouth at bedtime.   glucose blood (ONETOUCH VERIO) test strip USE 1 STRIP TO CHECK GLUCOSE UP TO 4 TIMES DAILY AS DIRECTED   Lancets (ONETOUCH DELICA PLUS LANCET33G) MISC USE  1 TO  CHECK GLUCOSE UP TO 4 TIMES DAILY AS DIRECTED   Lavender Oil OIL Apply 1 application topically daily as needed (stress).   Lemon Oil OIL Take 1 application by mouth daily as needed (add to water).   Lemongrass Oil OIL Apply 1 application topically daily as needed (multiple uses).   Magnesium Bisglycinate (MAG GLYCINATE) 100 MG TABS Take 270 mg by mouth in the morning, at noon, and at bedtime.   metFORMIN (GLUCOPHAGE-XR) 500 MG 24 hr tablet Take 1/2 (one-half) tablet by mouth twice daily   metoprolol tartrate (LOPRESSOR) 50 MG tablet Take 1/2 (one-half) tablet by mouth twice daily   Multiple Vitamins-Minerals (MULTIVITAMIN WITH MINERALS) tablet Take 1 tablet by mouth daily.   Oil Base OIL Breathe oil as needed for congestion   OIL OF OREGANO PO Uses as needed   peppermint oil liquid Apply 1 application topically daily as needed (itchy/ cold symptoms).   Tea Tree Oil OIL Apply 1 application topically daily as needed (fungus).   vitamin B-12 (CYANOCOBALAMIN) 500 MCG tablet Take 500 mcg by mouth daily.   vitamin C (ASCORBIC  ACID) 500 MG tablet Take 500 mg by mouth daily.    vitamin E 45 MG (100 UNITS) capsule Take by mouth daily.   [DISCONTINUED] furosemide (LASIX) 20 MG tablet Take 1 tablet (20 mg total) by mouth daily as needed for fluid or edema.       09/06/2022   10:35 AM 03/20/2022    9:35 AM 03/10/2022   11:15 AM 10/28/2021    1:36 PM  GAD 7 : Generalized Anxiety Score  Nervous, Anxious, on Edge 0 1 0 0  Control/stop worrying 0 0 0 0  Worry too much - different things 0 0 0 0  Trouble relaxing 2 0 0 0  Restless 1 0 0 0  Easily annoyed or irritable 0 0 0 0  Afraid - awful might happen 0 0 0 0  Total GAD 7 Score 3 1 0 0  Anxiety Difficulty Not difficult at all Not difficult at all Not difficult at all Not difficult at all       09/06/2022   10:35 AM 03/20/2022    9:35 AM 03/10/2022   11:15 AM  Depression screen PHQ 2/9  Decreased Interest 2 0 1  Down, Depressed, Hopeless 0 0 1   PHQ - 2 Score 2 0 2  Altered sleeping 2 1 2   Tired, decreased energy 2 0 1  Change in appetite 1 0 0  Feeling bad or failure about yourself  0 0 1  Trouble concentrating 0 0 0  Moving slowly or fidgety/restless 1 0 0  Suicidal thoughts 0 0 0  PHQ-9 Score 8 1 6   Difficult doing work/chores Not difficult at all Not difficult at all Somewhat difficult    BP Readings from Last 3 Encounters:  09/06/22 128/70  08/25/22 126/86  07/26/22 128/60    Physical Exam Vitals and nursing note reviewed.  Constitutional:      General: She is not in acute distress.    Appearance: She is well-developed.  HENT:     Head: Normocephalic and atraumatic.  Neck:     Vascular: No carotid bruit.  Cardiovascular:     Rate and Rhythm: Normal rate and regular rhythm.  Pulmonary:     Effort: Pulmonary effort is normal. No respiratory distress.     Breath sounds: No wheezing or rhonchi.  Musculoskeletal:     Cervical back: Normal range of motion.     Right lower leg: No edema.     Left lower leg: No edema.  Lymphadenopathy:     Cervical: No cervical adenopathy.  Skin:    General: Skin is warm and dry.     Findings: No rash.  Neurological:     Mental Status: She is alert and oriented to person, place, and time.  Psychiatric:        Mood and Affect: Mood normal.        Behavior: Behavior normal.     Wt Readings from Last 3 Encounters:  09/06/22 233 lb (105.7 kg)  08/25/22 236 lb (107 kg)  07/26/22 237 lb (107.5 kg)    BP 128/70   Pulse 63   Ht 5\' 7"  (1.702 m)   Wt 233 lb (105.7 kg)   SpO2 98%   BMI 36.49 kg/m   Assessment and Plan:  Problem List Items Addressed This Visit       Endocrine   Hyperlipidemia associated with type 2 diabetes mellitus (Chronic)    Not on appropriate statin therapy. She was on Crestor once a week  previously. Lab Results  Component Value Date   LDLCALC 97 03/20/2022       Relevant Medications   furosemide (LASIX) 20 MG tablet   Type II diabetes  mellitus with complication - Primary (Chronic)    Clinically stable without s/s of hypoglycemia. Tolerating metformin well without side effects. She is concerned about the weight gain and wonders if metformin is not working as well. Lab Results  Component Value Date   HGBA1C 5.8 (H) 03/20/2022  Will reduce metformin to 250 mg qam Add Farxiga 10 mg daily - sample given.  Call for Rx if cost is affordable.      Relevant Orders   POCT glycosylated hemoglobin (Hb A1C) (Completed)   Microalbumin / creatinine urine ratio   Other Visit Diagnoses     Dependent edema       Relevant Medications   furosemide (LASIX) 20 MG tablet   Foot pain, left       consult podiatry for evaluation of dorsal foot mass       Return in about 4 months (around 01/06/2023) for DM.   Partially dictated using Dragon software, any errors are not intentional.  Reubin Milan, MD [facility] Primary Care and Sports Medicine [place], [place]

## 2022-09-07 ENCOUNTER — Ambulatory Visit: Payer: Medicare Other

## 2022-09-07 DIAGNOSIS — G8929 Other chronic pain: Secondary | ICD-10-CM

## 2022-09-07 DIAGNOSIS — M25551 Pain in right hip: Secondary | ICD-10-CM | POA: Diagnosis not present

## 2022-09-07 DIAGNOSIS — M25562 Pain in left knee: Secondary | ICD-10-CM | POA: Diagnosis not present

## 2022-09-07 DIAGNOSIS — M25561 Pain in right knee: Secondary | ICD-10-CM | POA: Diagnosis not present

## 2022-09-07 NOTE — Therapy (Addendum)
OUTPATIENT PHYSICAL THERAPY KNEE TREATMENT  Patient Name: Brandy [name] MRN: [id number] DOB:02-18-1956, 67 y.o., female Today's Date: 09/07/2022  END OF SESSION:  PT End of Session - 09/07/22 1622     Visit Number 4    Date for PT Re-Evaluation 10/06/22    PT Start Time 1532    PT Stop Time 1615    PT Time Calculation (min) 43 min    Equipment Utilized During Treatment Gait belt    Activity Tolerance Patient tolerated treatment well    Behavior During Therapy WFL for tasks assessed/performed            Past Medical History:  Diagnosis Date   Acute lower GI bleeding 01/19/2020   Arthritis    Atrial fibrillation with rapid ventricular response    Chicken pox    Chronic systolic CHF (congestive heart failure)    a. 03/2016 Echo: Ef 15-20%, diff HK, ant AK;  b. 05/2016 Echo: Ef 30-35%, diff HK, mildly dil LA/RA.   Diabetes mellitus without complication    Dysrhythmia    GERD (gastroesophageal reflux disease)    Heart murmur    Hip discomfort    been going on for 40 years    History of kidney stones    Hyperlipidemia associated with type 2 diabetes mellitus 05/12/2021   Hypertension    Moderate mitral regurgitation    a. 03/2016 Echo: mod MR in setting of LV dysfxn.   Motion sickness    car - back seat   NICM (nonischemic cardiomyopathy)    a. 03/2016 Echo: EF 15-20%, diff HK, ant AK, mod MR, mod dil LA, mildly dil RA;  b. 04/2016 Cath: nl cors;  c. 05/2016 Echo: EF 30-35%, diff HK.   Obesity    Obstructive sleep apnea    compliant with CPAP   Persistent atrial fibrillation    a. Dx 03/2016;  b. CHA2DS2VASc = 2-->Eliquis  BID;  b. 05/2016 Failed DCCV x 4.   Sleep apnea    Stomach irritation    Visit for monitoring Tikosyn therapy 07/24/2016   Past Surgical History:  Procedure Laterality Date   ATRIAL FIBRILLATION ABLATION N/A 12/09/2020   Procedure: ATRIAL FIBRILLATION ABLATION;  Surgeon: Lanier Prude, MD;  Location: [facility];  Service: Cardiovascular;   Laterality: N/A;   BIOPSY N/A 01/12/2020   Procedure: BIOPSY;  Surgeon: Pasty Spillers, MD;  Location: [facility];  Service: Endoscopy;  Laterality: N/A;   CARDIAC CATHETERIZATION N/A 04/17/2016   Procedure: Left Heart Cath and Coronary Angiography;  Surgeon: Iran Ouch, MD;  Location: [facility];  Service: Cardiovascular;  Laterality: N/A;   CARDIOVERSION N/A 10/22/2020   Procedure: CARDIOVERSION;  Surgeon: Yvonne Kendall, MD;  Location: [facility];  Service: Cardiovascular;  Laterality: N/A;   CHOLECYSTECTOMY     COLON SURGERY  2021   COLONOSCOPY N/A 01/20/2020   Procedure: COLONOSCOPY;  Surgeon: Regis Bill, MD;  Location: [facility];  Service: Endoscopy;  Laterality: N/A;   COLONOSCOPY WITH PROPOFOL N/A 01/12/2020   Procedure: COLONOSCOPY WITH PROPOFOL;  Surgeon: Pasty Spillers, MD;  Location: [facility];  Service: Endoscopy;  Laterality: N/A;  Diabetic - oral meds sleep apnea   ELECTROPHYSIOLOGIC STUDY N/A 05/26/2016   Procedure: Cardioversion;  Surgeon: Iran Ouch, MD;  Location: [facility];  Service: Cardiovascular;  Laterality: N/A;   ESOPHAGOGASTRODUODENOSCOPY (EGD) WITH PROPOFOL N/A 01/12/2020   Procedure: ESOPHAGOGASTRODUODENOSCOPY (EGD) WITH PROPOFOL;  Surgeon: Pasty Spillers, MD;  Location: [facility];  Service: Endoscopy;  Laterality: N/A;   POLYPECTOMY N/A 01/12/2020   Procedure: POLYPECTOMY;  Surgeon: Pasty Spillers, MD;  Location: [facility];  Service: Endoscopy;  Laterality: N/A;   Patient Active Problem List   Diagnosis Date Noted   Left lumbar radiculopathy 07/26/2022   Arthralgia of left knee 07/26/2022   Pes anserinus bursitis of right knee 04/19/2022   Primary osteoarthritis of right knee 03/27/2022   Greater 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 03/27/2022   It band syndrome, right 03/27/2022   Hyperlipidemia associated with type 2 diabetes mellitus 05/12/2021    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   Varicose veins of leg with pain, bilateral 07/06/2020   Acquired thrombophilia 05/24/2020   Type II diabetes mellitus with complication 01/19/2020   Gastric polyp    Colon polyps    Varicose veins of both lower extremities 10/20/2019   Dysphagia 10/20/2019   Osteoarthritis of right hand 10/31/2016   Nonischemic cardiomyopathy 04/14/2016   BMI 33.0-33.9,adult 04/14/2016   PCP: Dr. Bari Edward  REFERRING PROVIDER: Dr. Joseph Berkshire  REFERRING DIAG: Arthralgia of left knee  RATIONALE FOR EVALUATION AND TREATMENT: Rehabilitation  THERAPY DIAG: Chronic pain of left knee  Pain in right hip  Chronic pain of right knee  ONSET DATE: Ongoing for years, pt unsure of start date  FOLLOW-UP APPT SCHEDULED WITH REFERRING PROVIDER: Yes   FROM INITIAL EVALUATION SUBJECTIVE:                                                                                                                                                                                         SUBJECTIVE STATEMENT:  L knee pain  PERTINENT HISTORY:  Pt reports chronic L knee pain for multiple years. No known trauma and no prior history of surgery. Pain is mostly located in the inside portion of her knee. She saw [name] who ordered plain films which showed severe medial and mild-to-moderate patellofemoral compartment osteoarthritis. At the time of her evaluation it was felt that her clinical features were most consistent with medial tibiofemoral and patellofemaral arthralgia and possible chronic MCL sprains. She was prescribed a regimen of diclofenac, educated in activity modification, and advised to return in 4 weeks to consider intra-articular steroid injection if symptoms have not improved. It was also recommended that pt work with physical therapy. At this time pt would like to defer a steroid injection due to concerns of her blood sugar regulation. Pt was previously seen  by physical therapy for R hip and R knee pain which responded well to therapy. She also has a history of chronic low back  pain which started initially 33 years ago when she was pregnant. She was prescribed Gabapentin and also advised to start physical therapy as symptoms will allow.  History from 05/11/22 Pt reports history of chronic R hip pain for decades with recent onset of R knee pain in August 2023. No known trauma but pt thinks she might have aggravated her knee walking across uneven grass. She saw [name] and eventually had steroid injections in her R greater trochanter, R knee injection, and R pes bursa on 04/18/22. Radiographs (03/14/22) of R knee showed mild degenerative changes at the patellofemoral joint, worse laterally. She receives chiropractic care every 2 weeks for her low back/hip pain and also sees massage therapy once/month.   L knee radiographs: 07/26/22: IMPRESSION: Severe medial and mild-to-moderate patellofemoral compartment osteoarthritis.   PAIN:    Pain Intensity: Present: 3/10, Best: 0/10, Worst: 8/10 Pain location: Medial L knee pain radiating up inner thigh and down to the L foot; Pain Quality: sharp Radiating: Yes, up the medial L thigh and down to the L foot, occasional L toe throbbing  Numbness/Tingling: Yes, lateral L knee; Focal Weakness: Yes, L knee feels unstable and buckles, no hyperextension Aggravating factors: Walking too fast, extended standing, standing after extended sitting, steps; Relieving factors: resting, ice, elevation, heat, antiinflammatories, knee brace; 24-hour pain behavior: worsens with activity as day progresses History of prior back, hip, knee injury, pain, surgery, or therapy: Yes, history of chronic back, R hip/knee pain, and L hip pain. No prior history of orthopedic surgery to back, hip, or knee. Dominant hand: right Imaging: Yes, see history Red flags: Negative for bowel/bladder changes, saddle paresthesia, personal history of  cancer, h/o spinal tumors, h/o compression fx, h/o abdominal aneurysm, abdominal pain, chills/fever, night sweats, nausea, vomiting, and unrelenting pain;  PRECAUTIONS: None  WEIGHT BEARING RESTRICTIONS: No  FALLS: Has patient fallen in last 6 months? 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   Hobbies: Pottery (currently not limited but initially had trouble sitting at the wheel), gardening/yard work;  Patient Goals: walking across the yard with improved confidence, lifting heavier objects at home without pain in hip, working the yard, "I would like to be able to get back to walking."   OBJECTIVE:  Patient Surveys  LEFS To be completed FOTO 47, predicted improvement to 48  Cognition Patient is oriented to person, place, and time.  Recent memory is intact.  Remote memory is intact.  Attention span and concentration are intact.  Expressive speech is intact.  Patient's fund of knowledge is within normal limits for educational level.    Gross Musculoskeletal Assessment Tremor: None Bulk: Normal Tone: Normal  GAIT: Full gait assessment deferred however pt does demonstrate medial whip of R heel and bilateral ankle pronation during gait. No excessive frontal plane motion of pelvis noted. Gait does appears slightly antalgic  Posture:  Forward head/rounded shoulders in sitting, full posture assessment deferred;   AROM AROM (Normal range in degrees) AROM   Lumbar   Flexion (65) Moderate loss of segmental flexion  Extension (30) Mild loss  Right lateral flexion (25) Mild loss  Left lateral flexion (25) Mild loss  Right rotation (30) Mild loss  Left rotation (30)  Mild loss    AROM (Normal range in degrees) AROM    Hip Right Left  Flexion (125) WNL WNL  Extension (15)    Abduction (40) 25 25  Adduction     Internal Rotation (45)    External Rotation (45)        Knee    Flexion (135) 125*, soft end feel 125*, soft end feel  Extension (0) 0 0      Ankle    Dorsiflexion (20)    (* = pain; Blank rows = not tested)  LE MMT: MMT (out of 5) Right  Left   Hip flexion 5 4+  Hip extension    Hip abduction    Hip adduction    Hip internal rotation 5 4+  Hip external rotation 5 4+  Knee flexion    Knee extension 5 5  Ankle dorsiflexion 5 4+  Ankle plantarflexion Stron5 Strong  (* = pain; Blank rows = not tested)  Sensation Deferred  Reflexes Deferred  Muscle Length Hamstrings: R: Positive for tightness at 80 degrees L: Positive for tightness at 80 degrees  Quadriceps Michela Pitcher): R: Not examined L: Not examined Hip flexors Maisie Fus): R: Not examined L: Not examined IT band Claiborne Rigg): R: Not examined L: Not examined  Palpation Location LEFT  RIGHT           Quadriceps 0   Medial Hamstrings 0   Lateral Hamstrings 0   Lateral Hamstring tendon 1   Medial Hamstring tendon 1   Quadriceps tendon 0   Patella 0   Patellar Tendon 0   Tibial Tuberosity 1   Medial joint line 1   Lateral joint line 1   MCL 1   LCL 1   Adductor Tubercle 1   Pes Anserine tendon 1   Infrapatellar fat pad 0   Fibular head 1   Popliteal fossa 0   (Blank rows = not tested) Graded on 0-4 scale (0 = no pain, 1 = pain, 2 = pain with wincing/grimacing/flinching, 3 = pain with withdrawal, 4 = unwilling to allow palpation), (Blank rows = not tested)  VASCULAR Deferred  SPECIAL TESTS  Ligamentous Stability  ACL: Lachman's: R: Not examined L: Negative Active Lachman's: R: Not examined L: Negative Anterior Drawer: R: Not examined L: Negative Pivot Shift: R: Not examined L: Negative  PCL: Posterior Drawer: R: Not examined L: Negative Reverse Lachman's: R: Not examined  L: Negative Posterior Sag Sign: R: Not examined L: Negative  MCL: Valgus Stress (30 degrees flexion): R: Not examined L: Negative  LCL: Varus Stress (30 degrees flexion): R: Not examined L: Negative  Meniscus Tests McMurray's Test:  Medial Meniscus (Tibial ER): R: Not examined L: Negative Lateral Meniscus (Tibial IR): R: Not examined L: Negative Thessaly: R: Not examined L: Not examined Ege's: R: Not examined L: Not examined Effusion: R: Not examined L: Negative   Patellofemoral Pain Syndrome Patellar Tilt (Lateral): R: Not examined L: Negative Patellar Apprehension: R: Not examined L: Not examined Squatting pain: R: Not examined L: Positive Stair climbing pain: R: Not examined L: Positive Kneeling pain: R: Not examined L: Positive Resisted knee extension pain: R: Not examined L: Negative Compression: R: Not examined L: Positive  Patellar Tendinopathy Inferior pole palpation with anterior tilt: R: Not examined L: Negative  Passive Accessory Intervertebral Motion Deferred  Hip: FABER (SN 81): R: Positive L: Negative FADIR (SN 94): R: Positive L: Negative Hip scour (SN 50): R: Negative L: Negative Figure 4: R: Negative  L: Positive for lateral L hip pain  SIJ:  Thigh Thrust (SN 88, -LR 0.18) : R: Not examined L: Not examined  Piriformis Syndrome: FAIR Test (SN 88, SP 83): R: Not examined L: Not examined  Functional Tasks Deferred   Beighton Scale: Deferred, denies history of hypermobility   TODAY'S TREATMENT    SUBJECTIVE: Pt reports that she is doing well today. She states that [name] wanted her to try to taper down on the gabapentin to see how much of her pain was nerve related and how much pain was related to inflammation. No specific questions or concerns currently.    PAIN: Denies resting pain;   Ther-ex : NuStep L4 x 10 minutes for BLE strengthening and warm-up during interval history; BLE stretches: Calf, Hamstrings, quads and Iliopsoas 2  x 60 secs each.  B knee Jt mobs.  Pt education about the significance of stretches every day. Bridges with Marcelo Baldy and GTB 2 x 60 secs STS 2 x 60 secs Mini squats 2 x 60 secs.  Pt provided with HEP.    Not performed:  Total Gym (TG) Level 22 (L22) double leg squats 2 x 15; TG L22 double leg heel raises 2 x 20; Supine SLR with 4# ankle weight (AW) 2 x 10 BLE; Supine L SAQ over bolster with heavy manual resistance 2 x 10; Hooklying clams with manual resistance 2 x 10; Hooklying adductor squeeze with manual resistance 2 x 10; Sidelying hip abduction with 4# AW 2 x 10 BLE; Sidelying reverse clam with 4# AW 2 x 10 BLE;   PATIENT EDUCATION:  Education details: Pt educated throughout session about proper posture and technique with exercises, HEP.  Improved exercise technique, movement at target joints, use of target muscles after min to mod verbal, visual, tactile cues.  Person educated: Patient Education method: Explanation Education comprehension: verbalized understanding   HOME EXERCISE PROGRAM:  Access Code: [email or web address] URL: [email or web address] Date: 09/07/2022 Prepared by: Janet Berlin  Exercises - Gastroc Stretch on Wall  - 2 x daily - 7 x weekly - 1 sets - 2 reps - 60 hold - Seated Hamstring Stretch  - 2 x daily - 7 x weekly - 1 sets - 2 reps - 60 hold - Modified Thomas Stretch  - 2 x daily - 7 x weekly - 1 sets - 2- reps - 60 hold - Sit to Stand  - 1 x daily - 7 x weekly - 2 sets - 15 reps   GOALS: Goals reviewed with patient? Yes  SHORT TERM GOALS: Target date: 09/08/2022   Pt will be independent with HEP to improve strength and decrease knee pain to improve pain-free function at home and work. Baseline:  Goal status: INITIAL   LONG TERM GOALS: Target date: 10/06/2022   Pt will increase FOTO to at least 58 to demonstrate significant improvement in function at home and work related to knee pain  Baseline: 08/11/22: 47 Goal status: INITIAL  2.  Pt  will be able to bend, squat, and walk across an uneven yard without an increase in her L knee pain in order to resume working in her yard/garden as well as return to walking for exercise with less pain Baseline:  Goal status: INITIAL  3.  Pt will increase LEFS score by at least 9 points in order demonstrate clinically significant reduction in knee pain/disability.       Baseline: 08/11/22: 43/80 Goal status: INITIAL  4.  Pt will increase strength of  L hip abduction to within 10% of R side in order to demonstrate improvement in strength and function and improved stability at left knee . Baseline: 08/11/22: To be completed; 08/24/22: Hip abduction R/L: 26.2#/17.7#; Goal status: INITIAL   ASSESSMENT:  CLINICAL IMPRESSION: Patient demonstrates excellent motivation during session today. Strengthen inI introduced stretches and Jt mobs  during session with patient.Pt performed Closed chain exs to improve stability after the stretches.  Pt encouraged to follow-up as scheduled. Pt education regarding her condition to improve compliance to BLE stretches. Pt tol tx well.  Pt will benefit from PT services to address deficits in strength, mobility, and pain in order to improve function at home and with leisure activities.   OBJECTIVE IMPAIRMENTS: Abnormal gait, decreased strength, and pain.   ACTIVITY LIMITATIONS: lifting, standing, squatting, and stairs  PARTICIPATION LIMITATIONS: cleaning, shopping, community activity, occupation, and yard work  PERSONAL FACTORS: Age, Time since onset of injury/illness/exacerbation, and 3+ comorbidities: DM, obesity, OSA, and afib  are also affecting patient's functional outcome.   REHAB POTENTIAL: Fair    CLINICAL DECISION MAKING: Unstable/unpredictable  EVALUATION COMPLEXITY: Moderate   PLAN: PT FREQUENCY: 1-2x/week  PT DURATION: 8 weeks  PLANNED INTERVENTIONS: Therapeutic exercises, Therapeutic activity, Neuromuscular re-education, Balance training, Gait  training, Patient/Family education, Self Care, Joint mobilization, Joint manipulation, Vestibular training, Canalith repositioning, Orthotic/Fit training, DME instructions, Dry Needling, Electrical stimulation, Spinal manipulation, Spinal mobilization, Cryotherapy, Moist heat, Taping, Traction, Ultrasound, Ionotophoresis 4mg /ml Dexamethasone, Manual therapy, and Re-evaluation.  PLAN FOR NEXT SESSION: Measure bilateral hip IR/ER, progress strengthening, issue HEP;  Janet Berlin PT DPT 4:24 PM,09/07/22

## 2022-09-11 NOTE — Progress Notes (Signed)
Care[facility] Summary Report / Loop Recorder 

## 2022-09-12 ENCOUNTER — Ambulatory Visit: Payer: Medicare Other

## 2022-09-12 DIAGNOSIS — M25562 Pain in left knee: Secondary | ICD-10-CM | POA: Diagnosis not present

## 2022-09-12 DIAGNOSIS — M25551 Pain in right hip: Secondary | ICD-10-CM | POA: Diagnosis not present

## 2022-09-12 DIAGNOSIS — M25561 Pain in right knee: Secondary | ICD-10-CM | POA: Diagnosis not present

## 2022-09-12 DIAGNOSIS — G8929 Other chronic pain: Secondary | ICD-10-CM

## 2022-09-12 NOTE — Therapy (Signed)
OUTPATIENT PHYSICAL THERAPY KNEE TREATMENT  Patient Name: Brandy [name] MRN: [id number] DOB:1955-06-25, 67 y.o., female Today's Date: 09/12/2022  END OF SESSION:  PT End of Session - 09/12/22 1152     Visit Number 5    Number of Visits 17    Date for PT Re-Evaluation 10/06/22    Authorization Type [employer] [facility]    Authorization Time Period eval: 08/11/22    PT Start Time 1145    PT Stop Time 1230    PT Time Calculation (min) 45 min    Equipment Utilized During Treatment --    Activity Tolerance Patient tolerated treatment well    Behavior During Therapy [facility] for tasks assessed/performed            Past Medical History:  Diagnosis Date   Acute lower GI bleeding 01/19/2020   Arthritis    Atrial fibrillation with rapid ventricular response (HCC)    Chicken pox    Chronic systolic CHF (congestive heart failure) (HCC)    a. 03/2016 Echo: Ef 15-20%, diff HK, ant AK;  b. 05/2016 Echo: Ef 30-35%, diff HK, mildly dil LA/RA.   Diabetes mellitus without complication (HCC)    Dysrhythmia    GERD (gastroesophageal reflux disease)    Heart murmur    Hip discomfort    been going on for 40 years    History of kidney stones    Hyperlipidemia associated with type 2 diabetes mellitus (HCC) 05/12/2021   Hypertension    Moderate mitral regurgitation    a. 03/2016 Echo: mod MR in setting of LV dysfxn.   Motion sickness    car - back seat   NICM (nonischemic cardiomyopathy) (HCC)    a. 03/2016 Echo: EF 15-20%, diff HK, ant AK, mod MR, mod dil LA, mildly dil RA;  b. 04/2016 Cath: nl cors;  c. 05/2016 Echo: EF 30-35%, diff HK.   Obesity    Obstructive sleep apnea    compliant with CPAP   Persistent atrial fibrillation (HCC)    a. Dx 03/2016;  b. CHA2DS2VASc = 2-->Eliquis 5mg  BID;  b. 05/2016 Failed DCCV x 4.   Sleep apnea    Stomach irritation    Visit for monitoring Tikosyn therapy 07/24/2016   Past Surgical History:  Procedure Laterality Date   ATRIAL FIBRILLATION ABLATION N/A  12/09/2020   Procedure: ATRIAL FIBRILLATION ABLATION;  Surgeon: Lanier Prude, MD;  Location: [facility];  Service: Cardiovascular;  Laterality: N/A;   BIOPSY N/A 01/12/2020   Procedure: BIOPSY;  Surgeon: Pasty Spillers, MD;  Location: [facility];  Service: Endoscopy;  Laterality: N/A;   CARDIAC CATHETERIZATION N/A 04/17/2016   Procedure: Left Heart Cath and Coronary Angiography;  Surgeon: Iran Ouch, MD;  Location: [facility] INVASIVE CV LAB;  Service: Cardiovascular;  Laterality: N/A;   CARDIOVERSION N/A 10/22/2020   Procedure: CARDIOVERSION;  Surgeon: Yvonne Kendall, MD;  Location: [facility] ORS;  Service: Cardiovascular;  Laterality: N/A;   CHOLECYSTECTOMY     COLON SURGERY  2021   COLONOSCOPY N/A 01/20/2020   Procedure: COLONOSCOPY;  Surgeon: Regis Bill, MD;  Location: [facility] ENDOSCOPY;  Service: Endoscopy;  Laterality: N/A;   COLONOSCOPY WITH PROPOFOL N/A 01/12/2020   Procedure: COLONOSCOPY WITH PROPOFOL;  Surgeon: Pasty Spillers, MD;  Location: [facility];  Service: Endoscopy;  Laterality: N/A;  Diabetic - oral meds sleep apnea   ELECTROPHYSIOLOGIC STUDY N/A 05/26/2016   Procedure: Cardioversion;  Surgeon: Iran Ouch, MD;  Location:  [facility] ORS;  Service: Cardiovascular;  Laterality: N/A;   ESOPHAGOGASTRODUODENOSCOPY (EGD) WITH PROPOFOL N/A 01/12/2020   Procedure: ESOPHAGOGASTRODUODENOSCOPY (EGD) WITH PROPOFOL;  Surgeon: Pasty Spillers, MD;  Location: [facility];  Service: Endoscopy;  Laterality: N/A;   POLYPECTOMY N/A 01/12/2020   Procedure: POLYPECTOMY;  Surgeon: Pasty Spillers, MD;  Location: [facility];  Service: Endoscopy;  Laterality: N/A;   Patient Active Problem List   Diagnosis Date Noted   Left lumbar radiculopathy 07/26/2022   Arthralgia of left knee 07/26/2022   Pes anserinus bursitis of right knee 04/19/2022   Primary osteoarthritis of right knee 03/27/2022   Greater trochanteric pain syndrome of  right lower extremity 03/27/2022   It band syndrome, right 03/27/2022   Hyperlipidemia associated with type 2 diabetes mellitus (HCC) 05/12/2021   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   Varicose veins of leg with pain, bilateral 07/06/2020   Acquired thrombophilia (HCC) 05/24/2020   Type II diabetes mellitus with complication (HCC) 01/19/2020   Gastric polyp    Colon polyps    Varicose veins of both lower extremities 10/20/2019   Dysphagia 10/20/2019   Osteoarthritis of right hand 10/31/2016   Nonischemic cardiomyopathy (HCC) 04/14/2016   BMI 33.0-33.9,adult 04/14/2016   PCP: Dr. Bari Edward  REFERRING PROVIDER: Dr. Joseph Berkshire  REFERRING DIAG: Arthralgia of left knee  RATIONALE FOR EVALUATION AND TREATMENT: Rehabilitation  THERAPY DIAG: Chronic pain of left knee  ONSET DATE: Ongoing for years, pt unsure of start date  FOLLOW-UP APPT SCHEDULED WITH REFERRING PROVIDER: Yes   FROM INITIAL EVALUATION SUBJECTIVE:                                                                                                                                                                                         SUBJECTIVE STATEMENT:  L knee pain  PERTINENT HISTORY:  Pt reports chronic L knee pain for multiple years. No known trauma and no prior history of surgery. Pain is mostly located in the inside portion of her knee. She saw [name] who ordered plain films which showed severe medial and mild-to-moderate patellofemoral compartment osteoarthritis. At the time of her evaluation it was felt that her clinical features were most consistent with medial tibiofemoral and patellofemaral arthralgia and possible chronic MCL sprains. She was prescribed a regimen of diclofenac, educated in activity modification, and advised to return in 4 weeks to consider intra-articular steroid injection if symptoms have not improved. It was also recommended that pt work with physical therapy. At this  time pt would like to defer a steroid injection due to concerns of her blood sugar regulation. Pt was previously seen by  physical therapy for R hip and R knee pain which responded well to therapy. She also has a history of chronic low back pain which started initially 33 years ago when she was pregnant. She was prescribed Gabapentin and also advised to start physical therapy as symptoms will allow.  History from 05/11/22 Pt reports history of chronic R hip pain for decades with recent onset of R knee pain in August 2023. No known trauma but pt thinks she might have aggravated her knee walking across uneven grass. She saw [name] and eventually had steroid injections in her R greater trochanter, R knee injection, and R pes bursa on 04/18/22. Radiographs (03/14/22) of R knee showed mild degenerative changes at the patellofemoral joint, worse laterally. She receives chiropractic care every 2 weeks for her low back/hip pain and also sees massage therapy once/month.   L knee radiographs: 07/26/22: IMPRESSION: Severe medial and mild-to-moderate patellofemoral compartment osteoarthritis.   PAIN:    Pain Intensity: Present: 3/10, Best: 0/10, Worst: 8/10 Pain location: Medial L knee pain radiating up inner thigh and down to the L foot; Pain Quality: sharp Radiating: Yes, up the medial L thigh and down to the L foot, occasional L toe throbbing  Numbness/Tingling: Yes, lateral L knee; Focal Weakness: Yes, L knee feels unstable and buckles, no hyperextension Aggravating factors: Walking too fast, extended standing, standing after extended sitting, steps; Relieving factors: resting, ice, elevation, heat, antiinflammatories, knee brace; 24-hour pain behavior: worsens with activity as day progresses History of prior back, hip, knee injury, pain, surgery, or therapy: Yes, history of chronic back, R hip/knee pain, and L hip pain. No prior history of orthopedic surgery to back, hip, or knee. Dominant hand:  right Imaging: Yes, see history Red flags: Negative for bowel/bladder changes, saddle paresthesia, personal history of cancer, h/o spinal tumors, h/o compression fx, h/o abdominal aneurysm, abdominal pain, chills/fever, night sweats, nausea, vomiting, and unrelenting pain;  PRECAUTIONS: None  WEIGHT BEARING RESTRICTIONS: No  FALLS: Has patient fallen in last 6 months? 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   Hobbies: Pottery (currently not limited but initially had trouble sitting at the wheel), gardening/yard work;  Patient Goals: walking across the yard with improved confidence, lifting heavier objects at home without pain in hip, working the yard, "I would like to be able to get back to walking."   OBJECTIVE:  Patient Surveys  LEFS To be completed FOTO 47, predicted improvement to 75  Cognition Patient is oriented to person, place, and time.  Recent memory is intact.  Remote memory is intact.  Attention span and concentration are intact.  Expressive speech is intact.  Patient's fund of knowledge is within normal limits for educational level.    Gross Musculoskeletal Assessment Tremor: None Bulk: Normal Tone: Normal  GAIT: Full gait assessment deferred however pt does demonstrate medial whip of R heel and bilateral ankle pronation during gait. No excessive frontal plane motion of pelvis noted. Gait does appears slightly antalgic  Posture:  Forward head/rounded shoulders in sitting, full posture assessment deferred;   AROM AROM (Normal range in degrees) AROM   Lumbar   Flexion (65) Moderate loss of segmental flexion  Extension (30) Mild loss  Right lateral flexion  (25) Mild loss  Left lateral flexion (25) Mild loss  Right rotation (30) Mild loss  Left rotation (30) Mild loss   AROM (Normal range in degrees) AROM    Hip Right Left  Flexion (125) WNL WNL  Extension (15)    Abduction (40) 25 25  Adduction     Internal Rotation (45)    External Rotation (45)        Knee    Flexion (135) 125*, soft end feel 125*, soft end feel  Extension (0) 0 0      Ankle    Dorsiflexion (20)    (* = pain; Blank rows = not tested)  LE MMT: MMT (out of 5) Right  Left   Hip flexion 5 4+  Hip extension    Hip abduction    Hip adduction    Hip internal rotation 5 4+  Hip external rotation 5 4+  Knee flexion    Knee extension 5 5  Ankle dorsiflexion 5 4+  Ankle plantarflexion Stron5 Strong  (* = pain; Blank rows = not tested)  Sensation Deferred  Reflexes Deferred  Muscle Length Hamstrings: R: Positive for tightness at 80 degrees L: Positive for tightness at 80 degrees  Quadriceps Michela Pitcher): R: Not examined L: Not examined Hip flexors Maisie Fus): R: Not examined L: Not examined IT band Claiborne Rigg): R: Not examined L: Not examined  Palpation Location LEFT  RIGHT           Quadriceps 0   Medial Hamstrings 0   Lateral Hamstrings 0   Lateral Hamstring tendon 1   Medial Hamstring tendon 1   Quadriceps tendon 0   Patella 0   Patellar Tendon 0   Tibial Tuberosity 1   Medial joint line 1   Lateral joint line 1   MCL 1   LCL 1   Adductor Tubercle 1   Pes Anserine tendon 1   Infrapatellar fat pad 0   Fibular head 1   Popliteal fossa 0   (Blank rows = not tested) Graded on 0-4 scale (0 = no pain, 1 = pain, 2 = pain with wincing/grimacing/flinching, 3 = pain with withdrawal, 4 = unwilling to allow palpation), (Blank rows = not tested)  VASCULAR Deferred  SPECIAL TESTS  Ligamentous Stability  ACL: Lachman's: R: Not examined L: Negative Active Lachman's: R: Not examined L: Negative Anterior Drawer: R: Not examined L: Negative Pivot Shift: R:  Not examined L: Negative  PCL: Posterior Drawer: R: Not examined L: Negative Reverse Lachman's: R: Not examined L: Negative Posterior Sag Sign: R: Not examined L: Negative  MCL: Valgus Stress (30 degrees flexion): R: Not examined L: Negative  LCL: Varus Stress (30 degrees flexion): R: Not examined L: Negative  Meniscus Tests McMurray's Test:  Medial Meniscus (Tibial ER): R: Not examined L: Negative Lateral Meniscus (Tibial IR): R: Not examined L: Negative Thessaly: R: Not examined L: Not examined Ege's: R: Not examined L: Not examined Effusion: R: Not examined L: Negative   Patellofemoral Pain Syndrome Patellar Tilt (Lateral): R: Not examined L: Negative Patellar Apprehension: R: Not examined L: Not examined Squatting pain: R: Not examined L: Positive Stair climbing pain: R: Not examined L: Positive Kneeling pain: R: Not examined L: Positive Resisted knee extension pain: R: Not examined L: Negative Compression: R: Not examined L: Positive  Patellar Tendinopathy Inferior pole palpation with anterior tilt: R: Not examined L: Negative  Passive Accessory Intervertebral Motion Deferred  Hip: FABER (SN 81):  R: Positive L: Negative FADIR (SN 94): R: Positive L: Negative Hip scour (SN 50): R: Negative L: Negative Figure 4: R: Negative L: Positive for lateral L hip pain  SIJ:  Thigh Thrust (SN 88, -LR 0.18) : R: Not examined L: Not examined  Piriformis Syndrome: FAIR Test (SN 88, SP 83): R: Not examined L: Not examined  Functional Tasks Deferred   Beighton Scale: Deferred, denies history of hypermobility   TODAY'S TREATMENT    SUBJECTIVE: Pt reports that she is doing well today. No specific questions or concerns currently.    PAIN: Denies resting pain;   Ther-ex NuStep L4-5 x 10 minutes for BLE strengthening and warm-up and LE strengthening during interval history (5 minutes unbilled); Total Gym (TG) Level 22 (L22) double leg squats x 15, added 2, 6# dumbbells  (DB) x 15; TG L22 double leg heel raises with 2, 6# DB 2 x 20; TG L22 single leg partial squat 2 x 10 BLE;  Standing exercises with 5# ankle weights (AW): Hip flexion marches x 10 BLE; Hip abduction x 10 BLE; Hamstring curls x 10 BLE; Hip extension x 10 BLE;  Seated LAQ with 5# AW 2 x 10 BLE; Seated clams with manual resistance 2 x 10 BLE; Seated adductor squeeze with manual resistance 2 x 10 BLE;   Not performed:  Supine SLR with 4# ankle weight (AW) 2 x 10 BLE; Supine L SAQ over bolster with heavy manual resistance 2 x 10; Hooklying clams with manual resistance 2 x 10; Hooklying adductor squeeze with manual resistance 2 x 10; Sidelying hip abduction with 4# AW 2 x 10 BLE; Sidelying reverse clam with 4# AW 2 x 10 BLE; BLE stretches: Calf, Hamstrings, quads and Iliopsoas 2 x 60 secs each.    PATIENT EDUCATION:  Education details: Pt educated throughout session about proper posture and technique with exercises, HEP.  Improved exercise technique, movement at target joints, use of target muscles after min to mod verbal, visual, tactile cues.  Person educated: Patient Education method: Explanation Education comprehension: verbalized understanding   HOME EXERCISE PROGRAM:  Access Code: [email or web address] URL: [email or web address] Date: 09/07/2022 Prepared by: Janet Berlin  Exercises - Gastroc Stretch on Wall  - 2 x daily - 7 x weekly - 1 sets - 2 reps - 60 hold - Seated Hamstring Stretch  - 2 x daily - 7 x weekly - 1 sets - 2 reps - 60 hold - Modified Thomas Stretch  - 2 x daily - 7 x weekly - 1 sets - 2- reps - 60 hold - Sit to Stand  - 1 x daily - 7 x weekly - 2 sets - 15 reps   GOALS: Goals reviewed with patient? Yes  SHORT TERM GOALS: Target date: 09/08/2022   Pt will be independent with HEP to improve strength and decrease knee pain to improve pain-free function at home and work. Baseline:  Goal status: INITIAL   LONG TERM GOALS: Target date: 10/06/2022   Pt  will increase FOTO to at least 58 to demonstrate significant improvement in function at home and work related to knee pain  Baseline: 08/11/22: 47 Goal status: INITIAL  2.  Pt will be able to bend, squat, and walk across an uneven yard without an increase in her L knee pain in order to resume working in her yard/garden as well as return to walking for exercise with less pain Baseline:  Goal status: INITIAL  3.  Pt will increase LEFS score by at least  9 points in order demonstrate clinically significant reduction in knee pain/disability.       Baseline: 08/11/22: 43/80 Goal status: INITIAL  4.  Pt will increase strength of L hip abduction to within 10% of R side in order to demonstrate improvement in strength and function and improved stability at left knee . Baseline: 08/11/22: To be completed; 08/24/22: Hip abduction R/L: 26.2#/17.7#; Goal status: INITIAL   ASSESSMENT:  CLINICAL IMPRESSION: Patient demonstrates excellent motivation during session today. Progressed strengthening and reintroduced single leg partial squats on the Total Gym. Pt denies an increase in L knee pain during session today. Pt encouraged to follow-up as scheduled and continue her HEP including newly issued stretches. Plan to progress strengthening during future sessions. Pt will benefit from PT services to address deficits in strength, mobility, and pain in order to improve function at home and with leisure activities.   OBJECTIVE IMPAIRMENTS: Abnormal gait, decreased strength, and pain.   ACTIVITY LIMITATIONS: lifting, standing, squatting, and stairs  PARTICIPATION LIMITATIONS: cleaning, shopping, community activity, occupation, and yard work  PERSONAL FACTORS: Age, Time since onset of injury/illness/exacerbation, and 3+ comorbidities: DM, obesity, OSA, and afib  are also affecting patient's functional outcome.   REHAB POTENTIAL: Fair    CLINICAL DECISION MAKING: Unstable/unpredictable  EVALUATION COMPLEXITY:  Moderate   PLAN: PT FREQUENCY: 1-2x/week  PT DURATION: 8 weeks  PLANNED INTERVENTIONS: Therapeutic exercises, Therapeutic activity, Neuromuscular re-education, Balance training, Gait training, Patient/Family education, Self Care, Joint mobilization, Joint manipulation, Vestibular training, Canalith repositioning, Orthotic/Fit training, DME instructions, Dry Needling, Electrical stimulation, Spinal manipulation, Spinal mobilization, Cryotherapy, Moist heat, Taping, Traction, Ultrasound, Ionotophoresis 4mg /ml Dexamethasone, Manual therapy, and Re-evaluation.  PLAN FOR NEXT SESSION: Measure bilateral hip IR/ER, progress strengthening, issue HEP;  Sharalyn Ink Denali Becvar PT, DPT, GCS  1:11 PM,09/12/22

## 2022-09-13 NOTE — Therapy (Signed)
OUTPATIENT PHYSICAL THERAPY KNEE TREATMENT  Patient Name: Brandy [name] MRN: [id number] DOB:14-Sep-1955, 67 y.o., female Today's Date: 09/17/2022  END OF SESSION:  PT End of Session - 09/17/22 1750     Visit Number 6    Number of Visits 17    Date for PT Re-Evaluation 10/06/22    Authorization Type [employer] [facility]    Authorization Time Period eval: 08/11/22    PT Start Time 0931    PT Stop Time 1015    PT Time Calculation (min) 44 min    Activity Tolerance Patient tolerated treatment well    Behavior During Therapy [facility] for tasks assessed/performed            Past Medical History:  Diagnosis Date   Acute lower GI bleeding 01/19/2020   Arthritis    Atrial fibrillation with rapid ventricular response (HCC)    Chicken pox    Chronic systolic CHF (congestive heart failure) (HCC)    a. 03/2016 Echo: Ef 15-20%, diff HK, ant AK;  b. 05/2016 Echo: Ef 30-35%, diff HK, mildly dil LA/RA.   Diabetes mellitus without complication (HCC)    Dysrhythmia    GERD (gastroesophageal reflux disease)    Heart murmur    Hip discomfort    been going on for 40 years    History of kidney stones    Hyperlipidemia associated with type 2 diabetes mellitus (HCC) 05/12/2021   Hypertension    Moderate mitral regurgitation    a. 03/2016 Echo: mod MR in setting of LV dysfxn.   Motion sickness    car - back seat   NICM (nonischemic cardiomyopathy) (HCC)    a. 03/2016 Echo: EF 15-20%, diff HK, ant AK, mod MR, mod dil LA, mildly dil RA;  b. 04/2016 Cath: nl cors;  c. 05/2016 Echo: EF 30-35%, diff HK.   Obesity    Obstructive sleep apnea    compliant with CPAP   Persistent atrial fibrillation (HCC)    a. Dx 03/2016;  b. CHA2DS2VASc = 2-->Eliquis 5mg  BID;  b. 05/2016 Failed DCCV x 4.   Sleep apnea    Stomach irritation    Visit for monitoring Tikosyn therapy 07/24/2016   Past Surgical History:  Procedure Laterality Date   ATRIAL FIBRILLATION ABLATION N/A 12/09/2020   Procedure: ATRIAL FIBRILLATION  ABLATION;  Surgeon: Lanier Prude, MD;  Location: [facility];  Service: Cardiovascular;  Laterality: N/A;   BIOPSY N/A 01/12/2020   Procedure: BIOPSY;  Surgeon: Pasty Spillers, MD;  Location: [facility];  Service: Endoscopy;  Laterality: N/A;   CARDIAC CATHETERIZATION N/A 04/17/2016   Procedure: Left Heart Cath and Coronary Angiography;  Surgeon: Iran Ouch, MD;  Location: [facility] INVASIVE CV LAB;  Service: Cardiovascular;  Laterality: N/A;   CARDIOVERSION N/A 10/22/2020   Procedure: CARDIOVERSION;  Surgeon: Yvonne Kendall, MD;  Location: [facility] ORS;  Service: Cardiovascular;  Laterality: N/A;   CHOLECYSTECTOMY     COLON SURGERY  2021   COLONOSCOPY N/A 01/20/2020   Procedure: COLONOSCOPY;  Surgeon: Regis Bill, MD;  Location: [facility] ENDOSCOPY;  Service: Endoscopy;  Laterality: N/A;   COLONOSCOPY WITH PROPOFOL N/A 01/12/2020   Procedure: COLONOSCOPY WITH PROPOFOL;  Surgeon: Pasty Spillers, MD;  Location: [facility];  Service: Endoscopy;  Laterality: N/A;  Diabetic - oral meds sleep apnea   ELECTROPHYSIOLOGIC STUDY N/A 05/26/2016   Procedure: Cardioversion;  Surgeon: Iran Ouch, MD;  Location: [facility] ORS;  Service: Cardiovascular;  Laterality: N/A;  ESOPHAGOGASTRODUODENOSCOPY (EGD) WITH PROPOFOL N/A 01/12/2020   Procedure: ESOPHAGOGASTRODUODENOSCOPY (EGD) WITH PROPOFOL;  Surgeon: Pasty Spillers, MD;  Location: [facility];  Service: Endoscopy;  Laterality: N/A;   POLYPECTOMY N/A 01/12/2020   Procedure: POLYPECTOMY;  Surgeon: Pasty Spillers, MD;  Location: [facility];  Service: Endoscopy;  Laterality: N/A;   Patient Active Problem List   Diagnosis Date Noted   Left lumbar radiculopathy 07/26/2022   Arthralgia of left knee 07/26/2022   Pes anserinus bursitis of right knee 04/19/2022   Primary osteoarthritis of right knee 03/27/2022   Greater 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 03/27/2022   It band  syndrome, right 03/27/2022   Hyperlipidemia associated with type 2 diabetes mellitus (HCC) 05/12/2021   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   Varicose veins of leg with pain, bilateral 07/06/2020   Acquired thrombophilia (HCC) 05/24/2020   Type II diabetes mellitus with complication (HCC) 01/19/2020   Gastric polyp    Colon polyps    Varicose veins of both lower extremities 10/20/2019   Dysphagia 10/20/2019   Osteoarthritis of right hand 10/31/2016   Nonischemic cardiomyopathy (HCC) 04/14/2016   BMI 33.0-33.9,adult 04/14/2016   PCP: Dr. Bari Edward  REFERRING PROVIDER: Dr. Joseph Berkshire  REFERRING DIAG: Arthralgia of left knee  RATIONALE FOR EVALUATION AND TREATMENT: Rehabilitation  THERAPY DIAG: Chronic pain of left knee  ONSET DATE: Ongoing for years, pt unsure of start date  FOLLOW-UP APPT SCHEDULED WITH REFERRING PROVIDER: Yes   FROM INITIAL EVALUATION SUBJECTIVE:                                                                                                                                                                                         SUBJECTIVE STATEMENT:  L knee pain  PERTINENT HISTORY:  Pt reports chronic L knee pain for multiple years. No known trauma and no prior history of surgery. Pain is mostly located in the inside portion of her knee. She saw [name] who ordered plain films which showed severe medial and mild-to-moderate patellofemoral compartment osteoarthritis. At the time of her evaluation it was felt that her clinical features were most consistent with medial tibiofemoral and patellofemaral arthralgia and possible chronic MCL sprains. She was prescribed a regimen of diclofenac, educated in activity modification, and advised to return in 4 weeks to consider intra-articular steroid injection if symptoms have not improved. It was also recommended that pt work with physical therapy. At this time pt would like to defer a steroid  injection due to concerns of her blood sugar regulation. Pt was previously seen by physical therapy for R hip and R knee pain which  responded well to therapy. She also has a history of chronic low back pain which started initially 33 years ago when she was pregnant. She was prescribed Gabapentin and also advised to start physical therapy as symptoms will allow.  History from 05/11/22 Pt reports history of chronic R hip pain for decades with recent onset of R knee pain in August 2023. No known trauma but pt thinks she might have aggravated her knee walking across uneven grass. She saw [name] and eventually had steroid injections in her R greater trochanter, R knee injection, and R pes bursa on 04/18/22. Radiographs (03/14/22) of R knee showed mild degenerative changes at the patellofemoral joint, worse laterally. She receives chiropractic care every 2 weeks for her low back/hip pain and also sees massage therapy once/month.   L knee radiographs: 07/26/22: IMPRESSION: Severe medial and mild-to-moderate patellofemoral compartment osteoarthritis.   PAIN:    Pain Intensity: Present: 3/10, Best: 0/10, Worst: 8/10 Pain location: Medial L knee pain radiating up inner thigh and down to the L foot; Pain Quality: sharp Radiating: Yes, up the medial L thigh and down to the L foot, occasional L toe throbbing  Numbness/Tingling: Yes, lateral L knee; Focal Weakness: Yes, L knee feels unstable and buckles, no hyperextension Aggravating factors: Walking too fast, extended standing, standing after extended sitting, steps; Relieving factors: resting, ice, elevation, heat, antiinflammatories, knee brace; 24-hour pain behavior: worsens with activity as day progresses History of prior back, hip, knee injury, pain, surgery, or therapy: Yes, history of chronic back, R hip/knee pain, and L hip pain. No prior history of orthopedic surgery to back, hip, or knee. Dominant hand: right Imaging: Yes, see history Red  flags: Negative for bowel/bladder changes, saddle paresthesia, personal history of cancer, h/o spinal tumors, h/o compression fx, h/o abdominal aneurysm, abdominal pain, chills/fever, night sweats, nausea, vomiting, and unrelenting pain;  PRECAUTIONS: None  WEIGHT BEARING RESTRICTIONS: No  FALLS: Has patient fallen in last 6 months? No  Living Environment Lives with: lives alone Lives in: House/apartment Stairs: Yes: External: 3 steps; can reach both Has following equipment at home: Single point cane  Prior level of function: Independent  Occupational demands: Pt is an assessment specialist at 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   Hobbies: Pottery (currently not limited but initially had trouble sitting at the wheel), gardening/yard work;  Patient Goals: walking across the yard with improved confidence, lifting heavier objects at home without pain in hip, working the yard, "I would like to be able to get back to walking."   OBJECTIVE:  Patient Surveys  LEFS To be completed FOTO 47, predicted improvement to 42  Cognition Patient is oriented to person, place, and time.  Recent memory is intact.  Remote memory is intact.  Attention span and concentration are intact.  Expressive speech is intact.  Patient's fund of knowledge is within normal limits for educational level.    Gross Musculoskeletal Assessment Tremor: None Bulk: Normal Tone: Normal  GAIT: Full gait assessment deferred however pt does demonstrate medial whip of R heel and bilateral ankle pronation during gait. No excessive frontal plane motion of pelvis noted. Gait does appears slightly antalgic  Posture:  Forward head/rounded shoulders in sitting, full posture assessment deferred;   AROM AROM (Normal range in degrees) AROM   Lumbar   Flexion (65) Moderate loss of segmental flexion  Extension (30) Mild loss  Right lateral flexion (25) Mild loss  Left lateral flexion  (  25) Mild loss  Right rotation (30) Mild loss  Left rotation (30) Mild loss   AROM (Normal range in degrees) AROM    Hip Right Left  Flexion (125) WNL WNL  Extension (15)    Abduction (40) 25 25  Adduction     Internal Rotation (45)    External Rotation (45)        Knee    Flexion (135) 125*, soft end feel 125*, soft end feel  Extension (0) 0 0      Ankle    Dorsiflexion (20)    (* = pain; Blank rows = not tested)  LE MMT: MMT (out of 5) Right  Left   Hip flexion 5 4+  Hip extension    Hip abduction    Hip adduction    Hip internal rotation 5 4+  Hip external rotation 5 4+  Knee flexion    Knee extension 5 5  Ankle dorsiflexion 5 4+  Ankle plantarflexion Stron5 Strong  (* = pain; Blank rows = not tested)  Sensation Deferred  Reflexes Deferred  Muscle Length Hamstrings: R: Positive for tightness at 80 degrees L: Positive for tightness at 80 degrees  Quadriceps Michela Pitcher): R: Not examined L: Not examined Hip flexors Maisie Fus): R: Not examined L: Not examined IT band Claiborne Rigg): R: Not examined L: Not examined  Palpation Location LEFT  RIGHT           Quadriceps 0   Medial Hamstrings 0   Lateral Hamstrings 0   Lateral Hamstring tendon 1   Medial Hamstring tendon 1   Quadriceps tendon 0   Patella 0   Patellar Tendon 0   Tibial Tuberosity 1   Medial joint line 1   Lateral joint line 1   MCL 1   LCL 1   Adductor Tubercle 1   Pes Anserine tendon 1   Infrapatellar fat pad 0   Fibular head 1   Popliteal fossa 0   (Blank rows = not tested) Graded on 0-4 scale (0 = no pain, 1 = pain, 2 = pain with wincing/grimacing/flinching, 3 = pain with withdrawal, 4 = unwilling to allow palpation), (Blank rows = not tested)  VASCULAR Deferred  SPECIAL TESTS  Ligamentous Stability  ACL: Lachman's: R: Not examined L: Negative Active Lachman's: R: Not examined L: Negative Anterior Drawer: R: Not examined L: Negative Pivot Shift: R: Not examined L:  Negative  PCL: Posterior Drawer: R: Not examined L: Negative Reverse Lachman's: R: Not examined L: Negative Posterior Sag Sign: R: Not examined L: Negative  MCL: Valgus Stress (30 degrees flexion): R: Not examined L: Negative  LCL: Varus Stress (30 degrees flexion): R: Not examined L: Negative  Meniscus Tests McMurray's Test:  Medial Meniscus (Tibial ER): R: Not examined L: Negative Lateral Meniscus (Tibial IR): R: Not examined L: Negative Thessaly: R: Not examined L: Not examined Ege's: R: Not examined L: Not examined Effusion: R: Not examined L: Negative   Patellofemoral Pain Syndrome Patellar Tilt (Lateral): R: Not examined L: Negative Patellar Apprehension: R: Not examined L: Not examined Squatting pain: R: Not examined L: Positive Stair climbing pain: R: Not examined L: Positive Kneeling pain: R: Not examined L: Positive Resisted knee extension pain: R: Not examined L: Negative Compression: R: Not examined L: Positive  Patellar Tendinopathy Inferior pole palpation with anterior tilt: R: Not examined L: Negative  Passive Accessory Intervertebral Motion Deferred  Hip: FABER (SN 81): R: Positive L: Negative FADIR (SN 94): R: Positive L: Negative  Hip scour (SN 50): R: Negative L: Negative Figure 4: R: Negative L: Positive for lateral L hip pain  SIJ:  Thigh Thrust (SN 88, -LR 0.18) : R: Not examined L: Not examined  Piriformis Syndrome: FAIR Test (SN 88, SP 83): R: Not examined L: Not examined  Functional Tasks Deferred   Beighton Scale: Deferred, denies history of hypermobility   TODAY'S TREATMENT    SUBJECTIVE: Pt reports that she is doing well today. No resting L knee pain upon arrival. No specific questions or concerns currently.    PAIN: Denies resting pain;   Ther-ex NuStep L4-5 x 10 minutes for BLE strengthening and warm-up and LE strengthening during interval history (5 minutes unbilled); Total Gym (TG) Level 22 (L22) double leg squats with 2,  9# dumbbells (DB) x 10; TG L22 double leg heel raises with 2, 9# DB 2 x 15; TG L22 single leg partial squat 2 x 10 BLE;  Resisted side stepping with blue tband around ankles x multiple length, 2 bouts; Seated L LAQ with manual resistance 2 x 10; Seated clams with manual resistance form therapist 2 x 10; Seated adductor squeeze with manual resistance from therapist 2 x 10; Squats x 8, demonstration and verbal cues for proper form;   Not performed:  Supine SLR with 4# ankle weight (AW) 2 x 10 BLE; Supine L SAQ over bolster with heavy manual resistance 2 x 10; Hooklying clams with manual resistance 2 x 10; Hooklying adductor squeeze with manual resistance 2 x 10; Sidelying hip abduction with 4# AW 2 x 10 BLE; Sidelying reverse clam with 4# AW 2 x 10 BLE; BLE stretches: Calf, Hamstrings, quads and Iliopsoas 2 x 60 secs each.  Standing exercises with 5# ankle weights (AW): Hip flexion marches x 10 BLE; Hip abduction x 10 BLE; Hamstring curls x 10 BLE; Hip extension x 10 BLE;   PATIENT EDUCATION:  Education details: Pt educated throughout session about proper posture and technique with exercises, HEP.  Improved exercise technique, movement at target joints, use of target muscles after min to mod verbal, visual, tactile cues.  Person educated: Patient Education method: Explanation Education comprehension: verbalized understanding   HOME EXERCISE PROGRAM:  Access Code: [email or web address] URL: [email or web address] Date: 09/07/2022 Prepared by: Janet Berlin  Exercises - Gastroc Stretch on Wall  - 2 x daily - 7 x weekly - 1 sets - 2 reps - 60 hold - Seated Hamstring Stretch  - 2 x daily - 7 x weekly - 1 sets - 2 reps - 60 hold - Modified Thomas Stretch  - 2 x daily - 7 x weekly - 1 sets - 2- reps - 60 hold - Sit to Stand  - 1 x daily - 7 x weekly - 2 sets - 15 reps   GOALS: Goals reviewed with patient? Yes  SHORT TERM GOALS: Target date: 09/08/2022   Pt will be independent  with HEP to improve strength and decrease knee pain to improve pain-free function at home and work. Baseline:  Goal status: INITIAL   LONG TERM GOALS: Target date: 10/06/2022   Pt will increase FOTO to at least 58 to demonstrate significant improvement in function at home and work related to knee pain  Baseline: 08/11/22: 47 Goal status: INITIAL  2.  Pt will be able to bend, squat, and walk across an uneven yard without an increase in her L knee pain in order to resume working in her yard/garden as well as return to walking for exercise with  less pain Baseline:  Goal status: INITIAL  3.  Pt will increase LEFS score by at least 9 points in order demonstrate clinically significant reduction in knee pain/disability.       Baseline: 08/11/22: 43/80 Goal status: INITIAL  4.  Pt will increase strength of L hip abduction to within 10% of R side in order to demonstrate improvement in strength and function and improved stability at left knee . Baseline: 08/11/22: To be completed; 08/24/22: Hip abduction R/L: 26.2#/17.7#; Goal status: INITIAL   ASSESSMENT:  CLINICAL IMPRESSION: Patient demonstrates excellent motivation during session today. Progressed strengthening and reintroduced single leg partial squats on the Total Gym. Pt denies an increase in L knee pain during session today. Pt encouraged to follow-up as scheduled and continue her HEP. Plan to progress strengthening during future sessions. Pt will benefit from PT services to address deficits in strength, mobility, and pain in order to improve function at home and with leisure activities.   OBJECTIVE IMPAIRMENTS: Abnormal gait, decreased strength, and pain.   ACTIVITY LIMITATIONS: lifting, standing, squatting, and stairs  PARTICIPATION LIMITATIONS: cleaning, shopping, community activity, occupation, and yard work  PERSONAL FACTORS: Age, Time since onset of injury/illness/exacerbation, and 3+ comorbidities: DM, obesity, OSA, and afib  are  also affecting patient's functional outcome.   REHAB POTENTIAL: Fair    CLINICAL DECISION MAKING: Unstable/unpredictable  EVALUATION COMPLEXITY: Moderate   PLAN: PT FREQUENCY: 1-2x/week  PT DURATION: 8 weeks  PLANNED INTERVENTIONS: Therapeutic exercises, Therapeutic activity, Neuromuscular re-education, Balance training, Gait training, Patient/Family education, Self Care, Joint mobilization, Joint manipulation, Vestibular training, Canalith repositioning, Orthotic/Fit training, DME instructions, Dry Needling, Electrical stimulation, Spinal manipulation, Spinal mobilization, Cryotherapy, Moist heat, Taping, Traction, Ultrasound, Ionotophoresis 4mg /ml Dexamethasone, Manual therapy, and Re-evaluation.  PLAN FOR NEXT SESSION: Measure bilateral hip IR/ER, progress strengthening, issue HEP;  Sharalyn Ink Jaleigh Mccroskey PT, DPT, GCS  5:54 PM,09/17/22

## 2022-09-15 ENCOUNTER — Ambulatory Visit: Payer: Medicare Other | Attending: Family Medicine

## 2022-09-15 DIAGNOSIS — M25562 Pain in left knee: Secondary | ICD-10-CM | POA: Diagnosis not present

## 2022-09-15 DIAGNOSIS — G8929 Other chronic pain: Secondary | ICD-10-CM

## 2022-09-18 NOTE — Therapy (Signed)
OUTPATIENT PHYSICAL THERAPY KNEE TREATMENT  Patient Name: Brandy [name] MRN: [id number] DOB:[birth year], 67 y.o., female Today's Date: 09/21/2022  END OF SESSION:  PT End of Session - 09/21/22 1119     Visit Number 7    Number of Visits 17    Date for PT Re-Evaluation 10/06/22    Authorization Type [employer] [facility]    Authorization Time Period eval: 08/11/22    PT Start Time 1145    PT Stop Time 1230    PT Time Calculation (min) 45 min    Activity Tolerance Patient tolerated treatment well    Behavior During Therapy [facility] for tasks assessed/performed            Past Medical History:  Diagnosis Date   Acute lower GI bleeding 01/19/2020   Arthritis    Atrial fibrillation with rapid ventricular response (HCC)    Chicken pox    Chronic systolic CHF (congestive heart failure) (HCC)    a. 03/2016 Echo: Ef 15-20%, diff HK, ant AK;  b. 05/2016 Echo: Ef 30-35%, diff HK, mildly dil LA/RA.   Diabetes mellitus without complication (HCC)    Dysrhythmia    GERD (gastroesophageal reflux disease)    Heart murmur    Hip discomfort    been going on for 40 years    History of kidney stones    Hyperlipidemia associated with type 2 diabetes mellitus (HCC) 05/12/2021   Hypertension    Moderate mitral regurgitation    a. 03/2016 Echo: mod MR in setting of LV dysfxn.   Motion sickness    car - back seat   NICM (nonischemic cardiomyopathy) (HCC)    a. 03/2016 Echo: EF 15-20%, diff HK, ant AK, mod MR, mod dil LA, mildly dil RA;  b. 04/2016 Cath: nl cors;  c. 05/2016 Echo: EF 30-35%, diff HK.   Obesity    Obstructive sleep apnea    compliant with CPAP   Persistent atrial fibrillation (HCC)    a. Dx 03/2016;  b. CHA2DS2VASc = 2-->Eliquis 5mg  BID;  b. 05/2016 Failed DCCV x 4.   Sleep apnea    Stomach irritation    Visit for monitoring Tikosyn therapy 07/24/2016   Past Surgical History:  Procedure Laterality Date   ATRIAL FIBRILLATION ABLATION N/A 12/09/2020   Procedure: ATRIAL FIBRILLATION  ABLATION;  Surgeon: Lanier Prude, MD;  Location: [facility];  Service: Cardiovascular;  Laterality: N/A;   BIOPSY N/A 01/12/2020   Procedure: BIOPSY;  Surgeon: Pasty Spillers, MD;  Location: [facility];  Service: Endoscopy;  Laterality: N/A;   CARDIAC CATHETERIZATION N/A 04/17/2016   Procedure: Left Heart Cath and Coronary Angiography;  Surgeon: Iran Ouch, MD;  Location: [facility] INVASIVE CV LAB;  Service: Cardiovascular;  Laterality: N/A;   CARDIOVERSION N/A 10/22/2020   Procedure: CARDIOVERSION;  Surgeon: Yvonne Kendall, MD;  Location: [facility] ORS;  Service: Cardiovascular;  Laterality: N/A;   CHOLECYSTECTOMY     COLON SURGERY  2021   COLONOSCOPY N/A 01/20/2020   Procedure: COLONOSCOPY;  Surgeon: Regis Bill, MD;  Location: [facility] ENDOSCOPY;  Service: Endoscopy;  Laterality: N/A;   COLONOSCOPY WITH PROPOFOL N/A 01/12/2020   Procedure: COLONOSCOPY WITH PROPOFOL;  Surgeon: Pasty Spillers, MD;  Location: [facility];  Service: Endoscopy;  Laterality: N/A;  Diabetic - oral meds sleep apnea   ELECTROPHYSIOLOGIC STUDY N/A 05/26/2016   Procedure: Cardioversion;  Surgeon: Iran Ouch, MD;  Location: [facility] OR[facility];  Service: Cardiovascular;  Laterality: N/A;  ESOPHAGOGASTRODUODENOSCOPY (EGD) WITH PROPOFOL N/A 01/12/2020   Procedure: ESOPHAGOGASTRODUODENOSCOPY (EGD) WITH PROPOFOL;  Surgeon: Pasty Spillers, MD;  Location: [facility];  Service: Endoscopy;  Laterality: N/A;   POLYPECTOMY N/A 01/12/2020   Procedure: POLYPECTOMY;  Surgeon: Pasty Spillers, MD;  Location: [facility];  Service: Endoscopy;  Laterality: N/A;   Patient Active Problem List   Diagnosis Date Noted   Left lumbar radiculopathy 07/26/2022   Arthralgia of left knee 07/26/2022   Pes anserinus bursitis of right knee 04/19/2022   Primary osteoarthritis of right knee 03/27/2022   Greater 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 03/27/2022   It band  syndrome, right 03/27/2022   Hyperlipidemia associated with type 2 diabetes mellitus (HCC) 05/12/2021   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   Varicose veins of leg with pain, bilateral 07/06/2020   Acquired thrombophilia (HCC) 05/24/2020   Type II diabetes mellitus with complication (HCC) 01/19/2020   Gastric polyp    Colon polyps    Varicose veins of both lower extremities 10/20/2019   Dysphagia 10/20/2019   Osteoarthritis of right hand 10/31/2016   Nonischemic cardiomyopathy (HCC) 04/14/2016   BMI 33.0-33.9,adult 04/14/2016   PCP: Dr. Bari Edward  REFERRING PROVIDER: Dr. Joseph Berkshire  REFERRING DIAG: Arthralgia of left knee  RATIONALE FOR EVALUATION AND TREATMENT: Rehabilitation  THERAPY DIAG: Chronic pain of left knee  ONSET DATE: Ongoing for years, pt unsure of start date  FOLLOW-UP APPT SCHEDULED WITH REFERRING PROVIDER: Yes   FROM INITIAL EVALUATION SUBJECTIVE:                                                                                                                                                                                         SUBJECTIVE STATEMENT:  L knee pain  PERTINENT HISTORY:  Pt reports chronic L knee pain for multiple years. No known trauma and no prior history of surgery. Pain is mostly located in the inside portion of her knee. She saw [name] who ordered plain films which showed severe medial and mild-to-moderate patellofemoral compartment osteoarthritis. At the time of her evaluation it was felt that her clinical features were most consistent with medial tibiofemoral and patellofemaral arthralgia and possible chronic MCL sprains. She was prescribed a regimen of diclofenac, educated in activity modification, and advised to return in 4 weeks to consider intra-articular steroid injection if symptoms have not improved. It was also recommended that pt work with physical therapy. At this time pt would like to defer a steroid  injection due to concerns of her blood sugar regulation. Pt was previously seen by physical therapy for R hip and R knee pain which  responded well to therapy. She also has a history of chronic low back pain which started initially 33 years ago when she was pregnant. She was prescribed Gabapentin and also advised to start physical therapy as symptoms will allow.  History from 05/11/22 Pt reports history of chronic R hip pain for decades with recent onset of R knee pain in August 2023. No known trauma but pt thinks she might have aggravated her knee walking across uneven grass. She saw [name] and eventually had steroid injections in her R greater trochanter, R knee injection, and R pes bursa on 04/18/22. Radiographs (03/14/22) of R knee showed mild degenerative changes at the patellofemoral joint, worse laterally. She receives chiropractic care every 2 weeks for her low back/hip pain and also sees massage therapy once/month.   L knee radiographs: 07/26/22: IMPRESSION: Severe medial and mild-to-moderate patellofemoral compartment osteoarthritis.   PAIN:    Pain Intensity: Present: 3/10, Best: 0/10, Worst: 8/10 Pain location: Medial L knee pain radiating up inner thigh and down to the L foot; Pain Quality: sharp Radiating: Yes, up the medial L thigh and down to the L foot, occasional L toe throbbing  Numbness/Tingling: Yes, lateral L knee; Focal Weakness: Yes, L knee feels unstable and buckles, no hyperextension Aggravating factors: Walking too fast, extended standing, standing after extended sitting, steps; Relieving factors: resting, ice, elevation, heat, antiinflammatories, knee brace; 24-hour pain behavior: worsens with activity as day progresses History of prior back, hip, knee injury, pain, surgery, or therapy: Yes, history of chronic back, R hip/knee pain, and L hip pain. No prior history of orthopedic surgery to back, hip, or knee. Dominant hand: right Imaging: Yes, see history Red  flags: Negative for bowel/bladder changes, saddle paresthesia, personal history of cancer, h/o spinal tumors, h/o compression fx, h/o abdominal aneurysm, abdominal pain, chills/fever, night sweats, nausea, vomiting, and unrelenting pain;  PRECAUTIONS: None  WEIGHT BEARING RESTRICTIONS: No  FALLS: Has patient fallen in last 6 months? 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   Hobbies: Pottery (currently not limited but initially had trouble sitting at the wheel), gardening/yard work;  Patient Goals: walking across the yard with improved confidence, lifting heavier objects at home without pain in hip, working the yard, "I would like to be able to get back to walking."   OBJECTIVE:  Patient Surveys  LEFS To be completed FOTO 47, predicted improvement to 20  Cognition Patient is oriented to person, place, and time.  Recent memory is intact.  Remote memory is intact.  Attention span and concentration are intact.  Expressive speech is intact.  Patient's fund of knowledge is within normal limits for educational level.    Gross Musculoskeletal Assessment Tremor: None Bulk: Normal Tone: Normal  GAIT: Full gait assessment deferred however pt does demonstrate medial whip of R heel and bilateral ankle pronation during gait. No excessive frontal plane motion of pelvis noted. Gait does appears slightly antalgic  Posture:  Forward head/rounded shoulders in sitting, full posture assessment deferred;   AROM AROM (Normal range in degrees) AROM   Lumbar   Flexion (65) Moderate loss of segmental flexion  Extension (30) Mild loss  Right lateral flexion (25) Mild loss  Left lateral flexion  (  25) Mild loss  Right rotation (30) Mild loss  Left rotation (30) Mild loss   AROM (Normal range in degrees) AROM    Hip Right Left  Flexion (125) WNL WNL  Extension (15)    Abduction (40) 25 25  Adduction     Internal Rotation (45)    External Rotation (45)        Knee    Flexion (135) 125*, soft end feel 125*, soft end feel  Extension (0) 0 0      Ankle    Dorsiflexion (20)    (* = pain; Blank rows = not tested)  LE MMT: MMT (out of 5) Right  Left   Hip flexion 5 4+  Hip extension    Hip abduction    Hip adduction    Hip internal rotation 5 4+  Hip external rotation 5 4+  Knee flexion    Knee extension 5 5  Ankle dorsiflexion 5 4+  Ankle plantarflexion Stron5 Strong  (* = pain; Blank rows = not tested)  Sensation Deferred  Reflexes Deferred  Muscle Length Hamstrings: R: Positive for tightness at 80 degrees L: Positive for tightness at 80 degrees  Quadriceps Michela Pitcher): R: Not examined L: Not examined Hip flexors Maisie Fus): R: Not examined L: Not examined IT band Claiborne Rigg): R: Not examined L: Not examined  Palpation Location LEFT  RIGHT           Quadriceps 0   Medial Hamstrings 0   Lateral Hamstrings 0   Lateral Hamstring tendon 1   Medial Hamstring tendon 1   Quadriceps tendon 0   Patella 0   Patellar Tendon 0   Tibial Tuberosity 1   Medial joint line 1   Lateral joint line 1   MCL 1   LCL 1   Adductor Tubercle 1   Pes Anserine tendon 1   Infrapatellar fat pad 0   Fibular head 1   Popliteal fossa 0   (Blank rows = not tested) Graded on 0-4 scale (0 = no pain, 1 = pain, 2 = pain with wincing/grimacing/flinching, 3 = pain with withdrawal, 4 = unwilling to allow palpation), (Blank rows = not tested)  VASCULAR Deferred  SPECIAL TESTS  Ligamentous Stability  ACL: Lachman's: R: Not examined L: Negative Active Lachman's: R: Not examined L: Negative Anterior Drawer: R: Not examined L: Negative Pivot Shift: R: Not examined L:  Negative  PCL: Posterior Drawer: R: Not examined L: Negative Reverse Lachman's: R: Not examined L: Negative Posterior Sag Sign: R: Not examined L: Negative  MCL: Valgus Stress (30 degrees flexion): R: Not examined L: Negative  LCL: Varus Stress (30 degrees flexion): R: Not examined L: Negative  Meniscus Tests McMurray's Test:  Medial Meniscus (Tibial ER): R: Not examined L: Negative Lateral Meniscus (Tibial IR): R: Not examined L: Negative Thessaly: R: Not examined L: Not examined Ege's: R: Not examined L: Not examined Effusion: R: Not examined L: Negative   Patellofemoral Pain Syndrome Patellar Tilt (Lateral): R: Not examined L: Negative Patellar Apprehension: R: Not examined L: Not examined Squatting pain: R: Not examined L: Positive Stair climbing pain: R: Not examined L: Positive Kneeling pain: R: Not examined L: Positive Resisted knee extension pain: R: Not examined L: Negative Compression: R: Not examined L: Positive  Patellar Tendinopathy Inferior pole palpation with anterior tilt: R: Not examined L: Negative  Passive Accessory Intervertebral Motion Deferred  Hip: FABER (SN 81): R: Positive L: Negative FADIR (SN 94): R: Positive L: Negative  Hip scour (SN 50): R: Negative L: Negative Figure 4: R: Negative L: Positive for lateral L hip pain  SIJ:  Thigh Thrust (SN 88, -LR 0.18) : R: Not examined L: Not examined  Piriformis Syndrome: FAIR Test (SN 88, SP 83): R: Not examined L: Not examined  Functional Tasks Deferred   Beighton Scale: Deferred, denies history of hypermobility   TODAY'S TREATMENT    SUBJECTIVE: Pt reports that she is doing well today. No resting L knee pain upon arrival. No specific questions or concerns currently.    PAIN: Denies resting pain;   Ther-ex NuStep L4-5 x 10 minutes for BLE strengthening and warm-up and LE strengthening during interval history (5 minutes unbilled); Total Gym (TG) Level 22 (L22) double leg squats with 2,  9# dumbbells (DB) 2 x 15; TG L22 double leg heel raises with 2, 9# DB 2 x 15; TG L22 single leg partial squat 2 x 10 BLE; TRX lateral lunges x 10 toward each side; TRX squats x 10, cues for proper form/technique; Resisted side stepping with blue tband around ankles in // bars x multiple length, 2 bouts divided by rest break; Seated LAQ with manual resistance 2 x 10 BLE; Seated clams with manual resistance form therapist 2 x 10; Seated adductor squeeze with manual resistance from therapist 2 x 10; Airex balance beam tandem gait x multiple bouts;   Not performed:  Supine SLR with 4# ankle weight (AW) 2 x 10 BLE; Supine L SAQ over bolster with heavy manual resistance 2 x 10; Hooklying clams with manual resistance 2 x 10; Hooklying adductor squeeze with manual resistance 2 x 10; Sidelying hip abduction with 4# AW 2 x 10 BLE; Sidelying reverse clam with 4# AW 2 x 10 BLE; BLE stretches: Calf, Hamstrings, quads and Iliopsoas 2 x 60 secs each.  Standing exercises with 5# ankle weights (AW): Hip flexion marches x 10 BLE; Hip abduction x 10 BLE; Hamstring curls x 10 BLE; Hip extension x 10 BLE;   PATIENT EDUCATION:  Education details: Pt educated throughout session about proper posture and technique with exercises, HEP.  Improved exercise technique, movement at target joints, use of target muscles after min to mod verbal, visual, tactile cues.  Person educated: Patient Education method: Explanation Education comprehension: verbalized understanding   HOME EXERCISE PROGRAM:  Access Code: [email or web address] URL: [email or web address] Date: 09/07/2022 Prepared by: Janet Berlin  Exercises - Gastroc Stretch on Wall  - 2 x daily - 7 x weekly - 1 sets - 2 reps - 60 hold - Seated Hamstring Stretch  - 2 x daily - 7 x weekly - 1 sets - 2 reps - 60 hold - Modified Thomas Stretch  - 2 x daily - 7 x weekly - 1 sets - 2- reps - 60 hold - Sit to Stand  - 1 x daily - 7 x weekly - 2 sets - 15  reps   GOALS: Goals reviewed with patient? Yes  SHORT TERM GOALS: Target date: 09/08/2022   Pt will be independent with HEP to improve strength and decrease knee pain to improve pain-free function at home and work. Baseline:  Goal status: INITIAL   LONG TERM GOALS: Target date: 10/06/2022   Pt will increase FOTO to at least 58 to demonstrate significant improvement in function at home and work related to knee pain  Baseline: 08/11/22: 47 Goal status: INITIAL  2.  Pt will be able to bend, squat, and walk across an uneven yard without an increase in  her L knee pain in order to resume working in her yard/garden as well as return to walking for exercise with less pain Baseline:  Goal status: INITIAL  3.  Pt will increase LEFS score by at least 9 points in order demonstrate clinically significant reduction in knee pain/disability.       Baseline: 08/11/22: 43/80 Goal status: INITIAL  4.  Pt will increase strength of L hip abduction to within 10% of R side in order to demonstrate improvement in strength and function and improved stability at left knee . Baseline: 08/11/22: To be completed; 08/24/22: Hip abduction R/L: 26.2#/17.7#; Goal status: INITIAL   ASSESSMENT:  CLINICAL IMPRESSION: Patient demonstrates excellent motivation during session today. Progressed strengthening and introduced TRX exercises. Exercises modified as needed to prevent increase in L knee pain. Pt encouraged to follow-up as scheduled and continue her HEP. Plan to progress strengthening during future sessions. Pt will benefit from PT services to address deficits in strength, mobility, and pain in order to improve function at home and with leisure activities.   OBJECTIVE IMPAIRMENTS: Abnormal gait, decreased strength, and pain.   ACTIVITY LIMITATIONS: lifting, standing, squatting, and stairs  PARTICIPATION LIMITATIONS: cleaning, shopping, community activity, occupation, and yard work  PERSONAL FACTORS: Age, Time  since onset of injury/illness/exacerbation, and 3+ comorbidities: DM, obesity, OSA, and afib  are also affecting patient's functional outcome.   REHAB POTENTIAL: Fair    CLINICAL DECISION MAKING: Unstable/unpredictable  EVALUATION COMPLEXITY: Moderate   PLAN: PT FREQUENCY: 1-2x/week  PT DURATION: 8 weeks  PLANNED INTERVENTIONS: Therapeutic exercises, Therapeutic activity, Neuromuscular re-education, Balance training, Gait training, Patient/Family education, Self Care, Joint mobilization, Joint manipulation, Vestibular training, Canalith repositioning, Orthotic/Fit training, DME instructions, Dry Needling, Electrical stimulation, Spinal manipulation, Spinal mobilization, Cryotherapy, Moist heat, Taping, Traction, Ultrasound, Ionotophoresis 4mg /ml Dexamethasone, Manual therapy, and Re-evaluation.  PLAN FOR NEXT SESSION: Measure bilateral hip IR/ER, progress strengthening, issue HEP;  Sharalyn Ink Kameron Glazebrook PT, DPT, GCS  11:20 AM,09/21/22

## 2022-09-19 ENCOUNTER — Ambulatory Visit: Payer: Medicare Other

## 2022-09-19 DIAGNOSIS — M25562 Pain in left knee: Secondary | ICD-10-CM | POA: Diagnosis not present

## 2022-09-19 DIAGNOSIS — M898X7 Other specified disorders of bone, ankle and foot: Secondary | ICD-10-CM | POA: Diagnosis not present

## 2022-09-19 DIAGNOSIS — G5792 Unspecified mononeuropathy of left lower limb: Secondary | ICD-10-CM | POA: Diagnosis not present

## 2022-09-19 DIAGNOSIS — G8929 Other chronic pain: Secondary | ICD-10-CM

## 2022-09-19 DIAGNOSIS — M19072 Primary osteoarthritis, left ankle and foot: Secondary | ICD-10-CM | POA: Diagnosis not present

## 2022-09-19 DIAGNOSIS — M79672 Pain in left foot: Secondary | ICD-10-CM | POA: Diagnosis not present

## 2022-09-22 ENCOUNTER — Ambulatory Visit: Payer: Medicare Other

## 2022-09-22 DIAGNOSIS — G8929 Other chronic pain: Secondary | ICD-10-CM | POA: Diagnosis not present

## 2022-09-22 DIAGNOSIS — M25562 Pain in left knee: Secondary | ICD-10-CM | POA: Diagnosis not present

## 2022-09-22 NOTE — Therapy (Signed)
OUTPATIENT PHYSICAL THERAPY KNEE TREATMENT  Patient Name: Brandy [name] MRN: [id number] DOB:May 13, 1956, 67 y.o., female Today's Date: 09/22/2022  END OF SESSION:  PT End of Session - 09/22/22 0846     Visit Number 8    Number of Visits 17    Date for PT Re-Evaluation 10/06/22    Authorization Type [employer] [facility]    Authorization Time Period eval: 08/11/22    PT Start Time 0845    PT Stop Time 0930    PT Time Calculation (min) 45 min    Activity Tolerance Patient tolerated treatment well    Behavior During Therapy [facility] for tasks assessed/performed            Past Medical History:  Diagnosis Date   Acute lower GI bleeding 01/19/2020   Arthritis    Atrial fibrillation with rapid ventricular response (HCC)    Chicken pox    Chronic systolic CHF (congestive heart failure) (HCC)    a. 03/2016 Echo: Ef 15-20%, diff HK, ant AK;  b. 05/2016 Echo: Ef 30-35%, diff HK, mildly dil LA/RA.   Diabetes mellitus without complication (HCC)    Dysrhythmia    GERD (gastroesophageal reflux disease)    Heart murmur    Hip discomfort    been going on for 40 years    History of kidney stones    Hyperlipidemia associated with type 2 diabetes mellitus (HCC) 05/12/2021   Hypertension    Moderate mitral regurgitation    a. 03/2016 Echo: mod MR in setting of LV dysfxn.   Motion sickness    car - back seat   NICM (nonischemic cardiomyopathy) (HCC)    a. 03/2016 Echo: EF 15-20%, diff HK, ant AK, mod MR, mod dil LA, mildly dil RA;  b. 04/2016 Cath: nl cors;  c. 05/2016 Echo: EF 30-35%, diff HK.   Obesity    Obstructive sleep apnea    compliant with CPAP   Persistent atrial fibrillation (HCC)    a. Dx 03/2016;  b. CHA2DS2VASc = 2-->Eliquis 5mg  BID;  b. 05/2016 Failed DCCV x 4.   Sleep apnea    Stomach irritation    Visit for monitoring Tikosyn therapy 07/24/2016   Past Surgical History:  Procedure Laterality Date   ATRIAL FIBRILLATION ABLATION N/A 12/09/2020   Procedure: ATRIAL  FIBRILLATION ABLATION;  Surgeon: Lanier Prude, MD;  Location: [facility] INVASIVE CV LAB;  Service: Cardiovascular;  Laterality: N/A;   BIOPSY N/A 01/12/2020   Procedure: BIOPSY;  Surgeon: Pasty Spillers, MD;  Location: [facility]ERY [facility];  Service: Endoscopy;  Laterality: N/A;   CARDIAC CATHETERIZATION N/A 04/17/2016   Procedure: Left Heart Cath and Coronary Angiography;  Surgeon: Iran Ouch, MD;  Location: [facility] INVASIVE CV LAB;  Service: Cardiovascular;  Laterality: N/A;   CARDIOVERSION N/A 10/22/2020   Procedure: CARDIOVERSION;  Surgeon: Yvonne Kendall, MD;  Location: [facility] ORS;  Service: Cardiovascular;  Laterality: N/A;   CHOLECYSTECTOMY     COLON SURGERY  2021   COLONOSCOPY N/A 01/20/2020   Procedure: COLONOSCOPY;  Surgeon: Regis Bill, MD;  Location: [facility] ENDOSCOPY;  Service: Endoscopy;  Laterality: N/A;   COLONOSCOPY WITH PROPOFOL N/A 01/12/2020   Procedure: COLONOSCOPY WITH PROPOFOL;  Surgeon: Pasty Spillers, MD;  Location: [facility];  Service: Endoscopy;  Laterality: N/A;  Diabetic - oral meds sleep apnea   ELECTROPHYSIOLOGIC STUDY N/A 05/26/2016   Procedure: Cardioversion;  Surgeon: Iran Ouch, MD;  Location: [facility] ORS;  Service: Cardiovascular;  Laterality: N/A;  ESOPHAGOGASTRODUODENOSCOPY (EGD) WITH PROPOFOL N/A 01/12/2020   Procedure: ESOPHAGOGASTRODUODENOSCOPY (EGD) WITH PROPOFOL;  Surgeon: Pasty Spillers, MD;  Location: [facility];  Service: Endoscopy;  Laterality: N/A;   POLYPECTOMY N/A 01/12/2020   Procedure: POLYPECTOMY;  Surgeon: Pasty Spillers, MD;  Location: [facility]ERY [facility];  Service: Endoscopy;  Laterality: N/A;   Patient Active Problem List   Diagnosis Date Noted   Left lumbar radiculopathy 07/26/2022   Arthralgia of left knee 07/26/2022   Pes anserinus bursitis of right knee 04/19/2022   Primary osteoarthritis of right knee 03/27/2022   Greater 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 03/27/2022    It band syndrome, right 03/27/2022   Hyperlipidemia associated with type 2 diabetes mellitus (HCC) 05/12/2021   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   Varicose veins of leg with pain, bilateral 07/06/2020   Acquired thrombophilia (HCC) 05/24/2020   Type II diabetes mellitus with complication (HCC) 01/19/2020   Gastric polyp    Colon polyps    Varicose veins of both lower extremities 10/20/2019   Dysphagia 10/20/2019   Osteoarthritis of right hand 10/31/2016   Nonischemic cardiomyopathy (HCC) 04/14/2016   BMI 33.0-33.9,adult 04/14/2016   PCP: Dr. Bari Edward  REFERRING PROVIDER: Dr. Joseph Berkshire  REFERRING DIAG: Arthralgia of left knee  RATIONALE FOR EVALUATION AND TREATMENT: Rehabilitation  THERAPY DIAG: Chronic pain of left knee  ONSET DATE: Ongoing for years, pt unsure of start date  FOLLOW-UP APPT SCHEDULED WITH REFERRING PROVIDER: Yes   FROM INITIAL EVALUATION SUBJECTIVE:                                                                                                                                                                                         SUBJECTIVE STATEMENT:  L knee pain  PERTINENT HISTORY:  Pt reports chronic L knee pain for multiple years. No known trauma and no prior history of surgery. Pain is mostly located in the inside portion of her knee. She saw [name] who ordered plain films which showed severe medial and mild-to-moderate patellofemoral compartment osteoarthritis. At the time of her evaluation it was felt that her clinical features were most consistent with medial tibiofemoral and patellofemaral arthralgia and possible chronic MCL sprains. She was prescribed a regimen of diclofenac, educated in activity modification, and advised to return in 4 weeks to consider intra-articular steroid injection if symptoms have not improved. It was also recommended that pt work with physical therapy. At this time pt would like to defer a  steroid injection due to concerns of her blood sugar regulation. Pt was previously seen by physical therapy for R hip and R knee pain which  responded well to therapy. She also has a history of chronic low back pain which started initially 33 years ago when she was pregnant. She was prescribed Gabapentin and also advised to start physical therapy as symptoms will allow.  History from 05/11/22 Pt reports history of chronic R hip pain for decades with recent onset of R knee pain in August 2023. No known trauma but pt thinks she might have aggravated her knee walking across uneven grass. She saw [name] and eventually had steroid injections in her R greater trochanter, R knee injection, and R pes bursa on 04/18/22. Radiographs (03/14/22) of R knee showed mild degenerative changes at the patellofemoral joint, worse laterally. She receives chiropractic care every 2 weeks for her low back/hip pain and also sees massage therapy once/month.   L knee radiographs: 07/26/22: IMPRESSION: Severe medial and mild-to-moderate patellofemoral compartment osteoarthritis.   PAIN:    Pain Intensity: Present: 3/10, Best: 0/10, Worst: 8/10 Pain location: Medial L knee pain radiating up inner thigh and down to the L foot; Pain Quality: sharp Radiating: Yes, up the medial L thigh and down to the L foot, occasional L toe throbbing  Numbness/Tingling: Yes, lateral L knee; Focal Weakness: Yes, L knee feels unstable and buckles, no hyperextension Aggravating factors: Walking too fast, extended standing, standing after extended sitting, steps; Relieving factors: resting, ice, elevation, heat, antiinflammatories, knee brace; 24-hour pain behavior: worsens with activity as day progresses History of prior back, hip, knee injury, pain, surgery, or therapy: Yes, history of chronic back, R hip/knee pain, and L hip pain. No prior history of orthopedic surgery to back, hip, or knee. Dominant hand: right Imaging: Yes, see  history Red flags: Negative for bowel/bladder changes, saddle paresthesia, personal history of cancer, h/o spinal tumors, h/o compression fx, h/o abdominal aneurysm, abdominal pain, chills/fever, night sweats, nausea, vomiting, and unrelenting pain;  PRECAUTIONS: None  WEIGHT BEARING RESTRICTIONS: No  FALLS: Has patient fallen in last 6 months? 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   Hobbies: Pottery (currently not limited but initially had trouble sitting at the wheel), gardening/yard work;  Patient Goals: walking across the yard with improved confidence, lifting heavier objects at home without pain in hip, working the yard, "I would like to be able to get back to walking."   OBJECTIVE:  Patient Surveys  LEFS To be completed FOTO 47, predicted improvement to 14  Cognition Patient is oriented to person, place, and time.  Recent memory is intact.  Remote memory is intact.  Attention span and concentration are intact.  Expressive speech is intact.  Patient's fund of knowledge is within normal limits for educational level.    Gross Musculoskeletal Assessment Tremor: None Bulk: Normal Tone: Normal  GAIT: Full gait assessment deferred however pt does demonstrate medial whip of R heel and bilateral ankle pronation during gait. No excessive frontal plane motion of pelvis noted. Gait does appears slightly antalgic  Posture:  Forward head/rounded shoulders in sitting, full posture assessment deferred;   AROM AROM (Normal range in degrees) AROM   Lumbar   Flexion (65) Moderate loss of segmental flexion  Extension (30) Mild loss  Right lateral flexion (25) Mild loss  Left  lateral  flexion (25) Mild loss  Right rotation (30) Mild loss  Left rotation (30) Mild loss   AROM (Normal range in degrees) AROM    Hip Right Left  Flexion (125) WNL WNL  Extension (15)    Abduction (40) 25 25  Adduction     Internal Rotation (45)    External Rotation (45)        Knee    Flexion (135) 125*, soft end feel 125*, soft end feel  Extension (0) 0 0      Ankle    Dorsiflexion (20)    (* = pain; Blank rows = not tested)  LE MMT: MMT (out of 5) Right  Left   Hip flexion 5 4+  Hip extension    Hip abduction    Hip adduction    Hip internal rotation 5 4+  Hip external rotation 5 4+  Knee flexion    Knee extension 5 5  Ankle dorsiflexion 5 4+  Ankle plantarflexion Stron5 Strong  (* = pain; Blank rows = not tested)  Sensation Deferred  Reflexes Deferred  Muscle Length Hamstrings: R: Positive for tightness at 80 degrees L: Positive for tightness at 80 degrees  Quadriceps Michela Pitcher): R: Not examined L: Not examined Hip flexors Maisie Fus): R: Not examined L: Not examined IT band Claiborne Rigg): R: Not examined L: Not examined  Palpation Location LEFT  RIGHT           Quadriceps 0   Medial Hamstrings 0   Lateral Hamstrings 0   Lateral Hamstring tendon 1   Medial Hamstring tendon 1   Quadriceps tendon 0   Patella 0   Patellar Tendon 0   Tibial Tuberosity 1   Medial joint line 1   Lateral joint line 1   MCL 1   LCL 1   Adductor Tubercle 1   Pes Anserine tendon 1   Infrapatellar fat pad 0   Fibular head 1   Popliteal fossa 0   (Blank rows = not tested) Graded on 0-4 scale (0 = no pain, 1 = pain, 2 = pain with wincing/grimacing/flinching, 3 = pain with withdrawal, 4 = unwilling to allow palpation), (Blank rows = not tested)  VASCULAR Deferred  SPECIAL TESTS  Ligamentous Stability  ACL: Lachman's: R: Not examined L: Negative Active Lachman's: R: Not examined L: Negative Anterior Drawer: R: Not examined L: Negative Pivot Shift: R: Not examined L:  Negative  PCL: Posterior Drawer: R: Not examined L: Negative Reverse Lachman's: R: Not examined L: Negative Posterior Sag Sign: R: Not examined L: Negative  MCL: Valgus Stress (30 degrees flexion): R: Not examined L: Negative  LCL: Varus Stress (30 degrees flexion): R: Not examined L: Negative  Meniscus Tests McMurray's Test:  Medial Meniscus (Tibial ER): R: Not examined L: Negative Lateral Meniscus (Tibial IR): R: Not examined L: Negative Thessaly: R: Not examined L: Not examined Ege's: R: Not examined L: Not examined Effusion: R: Not examined L: Negative   Patellofemoral Pain Syndrome Patellar Tilt (Lateral): R: Not examined L: Negative Patellar Apprehension: R: Not examined L: Not examined Squatting pain: R: Not examined L: Positive Stair climbing pain: R: Not examined L: Positive Kneeling pain: R: Not examined L: Positive Resisted knee extension pain: R: Not examined L: Negative Compression: R: Not examined L: Positive  Patellar Tendinopathy Inferior pole palpation with anterior tilt: R: Not examined L: Negative  Passive Accessory Intervertebral Motion Deferred  Hip: FABER (SN 81): R: Positive L: Negative FADIR (SN 94): R: Positive L:  Negative Hip scour (SN 50): R: Negative L: Negative Figure 4: R: Negative L: Positive for lateral L hip pain  SIJ:  Thigh Thrust (SN 88, -LR 0.18) : R: Not examined L: Not examined  Piriformis Syndrome: FAIR Test (SN 88, SP 83): R: Not examined L: Not examined  Functional Tasks Deferred   Beighton Scale: Deferred, denies history of hypermobility   TODAY'S TREATMENT    SUBJECTIVE: Pt reports that she is doing alright today. No resting L knee pain upon arrival but she continues to have a lot of radicular pain in the L upper thigh. She has tried adjusting the amount of gabapentin she takes however it does make her drowsy. She had an appointment with the chiropractor yesterday which typically helps with this pain if she has a  significant leg length asymmetry from pelvic rotation. No specific questions currently.    PAIN: L radicular thigh pain;   Ther-ex NuStep L4-5 x 10 minutes for BLE strengthening and warm-up and LE strengthening during interval history (5 minutes unbilled); TRX squats x 10, cues for proper form/technique; TRX lateral lunges x 10 toward each side; TRX split squats with 2 Airex pads under rear knee x 5 on each side;  Standing exercises with 5# ankle weights (AW): Hip flexion marches x 15 BLE; Hip abduction x 15 BLE; Hamstring curls x 15 BLE; Hip extension x 15 BLE;  Side stepping with 5# AW in // bars x multiple lengths; Seated LAQ with 5# AW x 15 BLE; Seated clams with manual resistance form therapist x 15; Seated adductor squeeze with manual resistance from therapist x 15; Prostretch calf stretch 3 x 45s;   Not performed:  Supine SLR with 4# ankle weight (AW) 2 x 10 BLE; Supine L SAQ over bolster with heavy manual resistance 2 x 10; Hooklying clams with manual resistance 2 x 10; Hooklying adductor squeeze with manual resistance 2 x 10; Sidelying hip abduction with 4# AW 2 x 10 BLE; Sidelying reverse clam with 4# AW 2 x 10 BLE; Total Gym (TG) Level 22 (L22) double leg squats with 2, 9# dumbbells (DB) 2 x 15; TG L22 double leg heel raises with 2, 9# DB 2 x 15; TG L22 single leg partial squat 2 x 10 BLE;    PATIENT EDUCATION:  Education details: Pt educated throughout session about proper posture and technique with exercises, HEP.  Improved exercise technique, movement at target joints, use of target muscles after min to mod verbal, visual, tactile cues.  Person educated: Patient Education method: Explanation Education comprehension: verbalized understanding   HOME EXERCISE PROGRAM:  Access Code: [email or web address] URL: [email or web address] Date: 09/07/2022 Prepared by: Janet Berlin  Exercises - Gastroc Stretch on Wall  - 2 x daily - 7 x weekly - 1 sets - 2 reps -  60 hold - Seated Hamstring Stretch  - 2 x daily - 7 x weekly - 1 sets - 2 reps - 60 hold - Modified Thomas Stretch  - 2 x daily - 7 x weekly - 1 sets - 2- reps - 60 hold - Sit to Stand  - 1 x daily - 7 x weekly - 2 sets - 15 reps   GOALS: Goals reviewed with patient? Yes  SHORT TERM GOALS: Target date: 09/08/2022   Pt will be independent with HEP to improve strength and decrease knee pain to improve pain-free function at home and work. Baseline:  Goal status: INITIAL   LONG TERM GOALS: Target date: 10/06/2022   Pt will  increase FOTO to at least 58 to demonstrate significant improvement in function at home and work related to knee pain  Baseline: 08/11/22: 47 Goal status: INITIAL  2.  Pt will be able to bend, squat, and walk across an uneven yard without an increase in her L knee pain in order to resume working in her yard/garden as well as return to walking for exercise with less pain Baseline:  Goal status: INITIAL  3.  Pt will increase LEFS score by at least 9 points in order demonstrate clinically significant reduction in knee pain/disability.       Baseline: 08/11/22: 43/80 Goal status: INITIAL  4.  Pt will increase strength of L hip abduction to within 10% of R side in order to demonstrate improvement in strength and function and improved stability at left knee . Baseline: 08/11/22: To be completed; 08/24/22: Hip abduction R/L: 26.2#/17.7#; Goal status: INITIAL   ASSESSMENT:  CLINICAL IMPRESSION: Patient demonstrates excellent motivation during session today. Progressed strengthening with TRX introducing split squats to simulate ground to stand transfers that pt would have to perform while gardening. Increased repetitions with standing ankle weight exercises. Exercises modified as needed to prevent increase in L knee pain. Pt encouraged to follow-up as scheduled and continue her HEP. Plan to progress strengthening during future sessions. Pt will benefit from PT services to  address deficits in strength, mobility, and pain in order to improve function at home and with leisure activities.   OBJECTIVE IMPAIRMENTS: Abnormal gait, decreased strength, and pain.   ACTIVITY LIMITATIONS: lifting, standing, squatting, and stairs  PARTICIPATION LIMITATIONS: cleaning, shopping, community activity, occupation, and yard work  PERSONAL FACTORS: Age, Time since onset of injury/illness/exacerbation, and 3+ comorbidities: DM, obesity, OSA, and afib  are also affecting patient's functional outcome.   REHAB POTENTIAL: Fair    CLINICAL DECISION MAKING: Unstable/unpredictable  EVALUATION COMPLEXITY: Moderate   PLAN: PT FREQUENCY: 1-2x/week  PT DURATION: 8 weeks  PLANNED INTERVENTIONS: Therapeutic exercises, Therapeutic activity, Neuromuscular re-education, Balance training, Gait training, Patient/Family education, Self Care, Joint mobilization, Joint manipulation, Vestibular training, Canalith repositioning, Orthotic/Fit training, DME instructions, Dry Needling, Electrical stimulation, Spinal manipulation, Spinal mobilization, Cryotherapy, Moist heat, Taping, Traction, Ultrasound, Ionotophoresis 4mg /ml Dexamethasone, Manual therapy, and Re-evaluation.  PLAN FOR NEXT SESSION: progress strengthening, issue HEP;  Sharalyn Ink Keaira Whitehurst PT, DPT, GCS  9:30 AM,09/22/22

## 2022-09-23 ENCOUNTER — Other Ambulatory Visit: Payer: Self-pay | Admitting: Internal Medicine

## 2022-09-23 DIAGNOSIS — E118 Type 2 diabetes mellitus with unspecified complications: Secondary | ICD-10-CM

## 2022-09-25 NOTE — Telephone Encounter (Signed)
Requested Prescriptions  Pending Prescriptions Disp Refills   metoprolol tartrate (LOPRESSOR) 50 MG tablet [Pharmacy Med Name: Metoprolol Tartrate 50 MG Oral Tablet] 90 tablet 0    Sig: Take 1/2 (one-half) tablet by mouth twice daily     Cardiovascular:  Beta Blockers Passed - 09/23/2022  6:48 AM      Passed - Last BP in normal range    BP Readings from Last 1 Encounters:  09/06/22 128/70         Passed - Last Heart Rate in normal range    Pulse Readings from Last 1 Encounters:  09/06/22 63         Passed - Valid encounter within last 6 months    Recent Outpatient Visits           2 weeks ago Type II diabetes mellitus with complication (HCC)   [place] [facility] at [facility], Nyoka Cowden, MD   1 month ago Left lumbar radiculopathy   [place] [facility], Ocie Bob, MD   2 months ago Left lumbar radiculopathy   [place] [facility], Ocie Bob, MD   5 months ago Primary osteoarthritis of right knee   [facility] at [facility], Ocie Bob, MD   5 months ago Primary osteoarthritis of right knee   [facility] at [facility], Ocie Bob, MD       Future Appointments             In 2 months Judithann Graves Nyoka Cowden, MD [facility] at [facility], [facility]             [facility] VERIO test strip [place] [facility] Name: OneTouch Verio In Vitro Strip] 100 each 0    Sig: USE 1 STRIP TO CHECK GLUCOSE UP TO 4 TIMES DAILY AS  DIRECTED     Endocrinology: Diabetes - Testing Supplies Passed - 09/23/2022  6:48 AM      Passed - Valid encounter within last 12 months    Recent Outpatient Visits           2 weeks ago Type II diabetes mellitus with complication [facility])   [place] [facility] at [facility], Nyoka Cowden, MD    1 month ago Left lumbar radiculopathy   [place] [facility] at [facility], Ocie Bob, MD   2 months ago Left lumbar radiculopathy   [place] [facility] at [facility], Ocie Bob, MD   5 months ago Primary osteoarthritis of right knee   [facility] at [facility], Ocie Bob, MD   5 months ago Primary osteoarthritis of right knee   [facility] at [facility], Ocie Bob, MD       Future Appointments             In 2 months Judithann Graves, Nyoka Cowden, MD [facility] at [facility], [facility]

## 2022-09-26 ENCOUNTER — Ambulatory Visit: Payer: Medicare Other

## 2022-09-26 DIAGNOSIS — G8929 Other chronic pain: Secondary | ICD-10-CM

## 2022-09-26 DIAGNOSIS — M25562 Pain in left knee: Secondary | ICD-10-CM | POA: Diagnosis not present

## 2022-09-26 NOTE — Therapy (Signed)
OUTPATIENT PHYSICAL THERAPY KNEE TREATMENT  Patient Name: Brandy [name] MRN: [id number] DOB:Oct 15, 1955, 67 y.o., female Today's Date: 09/28/2022  END OF SESSION:  PT End of Session - 09/28/22 0952     Visit Number 9    Number of Visits 17    Date for PT Re-Evaluation 10/06/22    Authorization Type [employer] [facility]    Authorization Time Period eval: 08/11/22    PT Start Time 1145    PT Stop Time 1230    PT Time Calculation (min) 45 min    Activity Tolerance Patient tolerated treatment well    Behavior During Therapy [facility] for tasks assessed/performed            Past Medical History:  Diagnosis Date   Acute lower GI bleeding 01/19/2020   Arthritis    Atrial fibrillation with rapid ventricular response (HCC)    Chicken pox    Chronic systolic CHF (congestive heart failure) (HCC)    a. 03/2016 Echo: Ef 15-20%, diff HK, ant AK;  b. 05/2016 Echo: Ef 30-35%, diff HK, mildly dil LA/RA.   Diabetes mellitus without complication (HCC)    Dysrhythmia    GERD (gastroesophageal reflux disease)    Heart murmur    Hip discomfort    been going on for 40 years    History of kidney stones    Hyperlipidemia associated with type 2 diabetes mellitus (HCC) 05/12/2021   Hypertension    Moderate mitral regurgitation    a. 03/2016 Echo: mod MR in setting of LV dysfxn.   Motion sickness    car - back seat   NICM (nonischemic cardiomyopathy) (HCC)    a. 03/2016 Echo: EF 15-20%, diff HK, ant AK, mod MR, mod dil LA, mildly dil RA;  b. 04/2016 Cath: nl cors;  c. 05/2016 Echo: EF 30-35%, diff HK.   Obesity    Obstructive sleep apnea    compliant with CPAP   Persistent atrial fibrillation (HCC)    a. Dx 03/2016;  b. CHA2DS2VASc = 2-->Eliquis 5mg  BID;  b. 05/2016 Failed DCCV x 4.   Sleep apnea    Stomach irritation    Visit for monitoring Tikosyn therapy 07/24/2016   Past Surgical History:  Procedure Laterality Date   ATRIAL FIBRILLATION ABLATION N/A 12/09/2020   Procedure: ATRIAL  FIBRILLATION ABLATION;  Surgeon: Lanier Prude, MD;  Location: [facility];  Service: Cardiovascular;  Laterality: N/A;   BIOPSY N/A 01/12/2020   Procedure: BIOPSY;  Surgeon: Pasty Spillers, MD;  Location: [facility];  Service: Endoscopy;  Laterality: N/A;   CARDIAC CATHETERIZATION N/A 04/17/2016   Procedure: Left Heart Cath and Coronary Angiography;  Surgeon: Iran Ouch, MD;  Location: [facility] INV[facility] CV [facility];  Service: Cardiovascular;  Laterality: N/A;   CARDIOVERSION N/A 10/22/2020   Procedure: CARDIOVERSION;  Surgeon: Yvonne Kendall, MD;  Location: [facility] OR[facility];  Service: Cardiovascular;  Laterality: N/A;   CHOLECYSTECTOMY     COLON SURGERY  2021   COLONOSCOPY N/A 01/20/2020   Procedure: COLONOSCOPY;  Surgeon: Regis Bill, MD;  Location: [facility];  Service: Endoscopy;  Laterality: N/A;   COLONOSCOPY WITH PROPOFOL N/A 01/12/2020   Procedure: COLONOSCOPY WITH PROPOFOL;  Surgeon: Pasty Spillers, MD;  Location: [facility];  Service: Endoscopy;  Laterality: N/A;  Diabetic - oral meds sleep apnea   ELECTROPHYSIOLOGIC STUDY N/A 05/26/2016   Procedure: Cardioversion;  Surgeon: Iran Ouch, MD;  Location: [facility];  Service: Cardiovascular;  Laterality: N/A;  ESOPHAGOGASTRODUODENOSCOPY (EGD) WITH PROPOFOL N/A 01/12/2020   Procedure: ESOPHAGOGASTRODUODENOSCOPY (EGD) WITH PROPOFOL;  Surgeon: Pasty Spillers, MD;  Location: [facility];  Service: Endoscopy;  Laterality: N/A;   POLYPECTOMY N/A 01/12/2020   Procedure: POLYPECTOMY;  Surgeon: Pasty Spillers, MD;  Location: [facility];  Service: Endoscopy;  Laterality: N/A;   Patient Active Problem List   Diagnosis Date Noted   Left lumbar radiculopathy 07/26/2022   Arthralgia of left knee 07/26/2022   Pes anserinus bursitis of right knee 04/19/2022   Primary osteoarthritis of right knee 03/27/2022   Greater 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 03/27/2022    It band syndrome, right 03/27/2022   Hyperlipidemia associated with type 2 diabetes mellitus (HCC) 05/12/2021   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   Varicose veins of leg with pain, bilateral 07/06/2020   Acquired thrombophilia (HCC) 05/24/2020   Type II diabetes mellitus with complication (HCC) 01/19/2020   Gastric polyp    Colon polyps    Varicose veins of both lower extremities 10/20/2019   Dysphagia 10/20/2019   Osteoarthritis of right hand 10/31/2016   Nonischemic cardiomyopathy (HCC) 04/14/2016   BMI 33.0-33.9,adult 04/14/2016   PCP: Dr. Bari Edward  REFERRING PROVIDER: Dr. Joseph Berkshire  REFERRING DIAG: Arthralgia of left knee  RATIONALE FOR EVALUATION AND TREATMENT: Rehabilitation  THERAPY DIAG: Chronic pain of left knee  ONSET DATE: Ongoing for years, pt unsure of start date  FOLLOW-UP APPT SCHEDULED WITH REFERRING PROVIDER: Yes   FROM INITIAL EVALUATION SUBJECTIVE:                                                                                                                                                                                         SUBJECTIVE STATEMENT:  L knee pain  PERTINENT HISTORY:  Pt reports chronic L knee pain for multiple years. No known trauma and no prior history of surgery. Pain is mostly located in the inside portion of her knee. She saw [name] who ordered plain films which showed severe medial and mild-to-moderate patellofemoral compartment osteoarthritis. At the time of her evaluation it was felt that her clinical features were most consistent with medial tibiofemoral and patellofemaral arthralgia and possible chronic MCL sprains. She was prescribed a regimen of diclofenac, educated in activity modification, and advised to return in 4 weeks to consider intra-articular steroid injection if symptoms have not improved. It was also recommended that pt work with physical therapy. At this time pt would like to defer a  steroid injection due to concerns of her blood sugar regulation. Pt was previously seen by physical therapy for R hip and R knee pain which  responded well to therapy. She also has a history of chronic low back pain which started initially 33 years ago when she was pregnant. She was prescribed Gabapentin and also advised to start physical therapy as symptoms will allow.  History from 05/11/22 Pt reports history of chronic R hip pain for decades with recent onset of R knee pain in August 2023. No known trauma but pt thinks she might have aggravated her knee walking across uneven grass. She saw [name] and eventually had steroid injections in her R greater trochanter, R knee injection, and R pes bursa on 04/18/22. Radiographs (03/14/22) of R knee showed mild degenerative changes at the patellofemoral joint, worse laterally. She receives chiropractic care every 2 weeks for her low back/hip pain and also sees massage therapy once/month.   L knee radiographs: 07/26/22: IMPRESSION: Severe medial and mild-to-moderate patellofemoral compartment osteoarthritis.   PAIN:    Pain Intensity: Present: 3/10, Best: 0/10, Worst: 8/10 Pain location: Medial L knee pain radiating up inner thigh and down to the L foot; Pain Quality: sharp Radiating: Yes, up the medial L thigh and down to the L foot, occasional L toe throbbing  Numbness/Tingling: Yes, lateral L knee; Focal Weakness: Yes, L knee feels unstable and buckles, no hyperextension Aggravating factors: Walking too fast, extended standing, standing after extended sitting, steps; Relieving factors: resting, ice, elevation, heat, antiinflammatories, knee brace; 24-hour pain behavior: worsens with activity as day progresses History of prior back, hip, knee injury, pain, surgery, or therapy: Yes, history of chronic back, R hip/knee pain, and L hip pain. No prior history of orthopedic surgery to back, hip, or knee. Dominant hand: right Imaging: Yes, see  history Red flags: Negative for bowel/bladder changes, saddle paresthesia, personal history of cancer, h/o spinal tumors, h/o compression fx, h/o abdominal aneurysm, abdominal pain, chills/fever, night sweats, nausea, vomiting, and unrelenting pain;  PRECAUTIONS: None  WEIGHT BEARING RESTRICTIONS: No  FALLS: Has patient fallen in last 6 months? 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   Hobbies: Pottery (currently not limited but initially had trouble sitting at the wheel), gardening/yard work;  Patient Goals: walking across the yard with improved confidence, lifting heavier objects at home without pain in hip, working the yard, "I would like to be able to get back to walking."   OBJECTIVE:  Patient Surveys  LEFS To be completed FOTO 47, predicted improvement to 5  Cognition Patient is oriented to person, place, and time.  Recent memory is intact.  Remote memory is intact.  Attention span and concentration are intact.  Expressive speech is intact.  Patient's fund of knowledge is within normal limits for educational level.    Gross Musculoskeletal Assessment Tremor: None Bulk: Normal Tone: Normal  GAIT: Full gait assessment deferred however pt does demonstrate medial whip of R heel and bilateral ankle pronation during gait. No excessive frontal plane motion of pelvis noted. Gait does appears slightly antalgic  Posture:  Forward head/rounded shoulders in sitting, full posture assessment deferred;   AROM AROM (Normal range in degrees) AROM   Lumbar   Flexion (65) Moderate loss of segmental flexion  Extension (30) Mild loss  Right lateral flexion (25) Mild loss  Left  lateral  flexion (25) Mild loss  Right rotation (30) Mild loss  Left rotation (30) Mild loss   AROM (Normal range in degrees) AROM    Hip Right Left  Flexion (125) WNL WNL  Extension (15)    Abduction (40) 25 25  Adduction     Internal Rotation (45)    External Rotation (45)        Knee    Flexion (135) 125*, soft end feel 125*, soft end feel  Extension (0) 0 0      Ankle    Dorsiflexion (20)    (* = pain; Blank rows = not tested)  LE MMT: MMT (out of 5) Right  Left   Hip flexion 5 4+  Hip extension    Hip abduction    Hip adduction    Hip internal rotation 5 4+  Hip external rotation 5 4+  Knee flexion    Knee extension 5 5  Ankle dorsiflexion 5 4+  Ankle plantarflexion Stron5 Strong  (* = pain; Blank rows = not tested)  Sensation Deferred  Reflexes Deferred  Muscle Length Hamstrings: R: Positive for tightness at 80 degrees L: Positive for tightness at 80 degrees  Quadriceps Michela Pitcher): R: Not examined L: Not examined Hip flexors Maisie Fus): R: Not examined L: Not examined IT band Claiborne Rigg): R: Not examined L: Not examined  Palpation Location LEFT  RIGHT           Quadriceps 0   Medial Hamstrings 0   Lateral Hamstrings 0   Lateral Hamstring tendon 1   Medial Hamstring tendon 1   Quadriceps tendon 0   Patella 0   Patellar Tendon 0   Tibial Tuberosity 1   Medial joint line 1   Lateral joint line 1   MCL 1   LCL 1   Adductor Tubercle 1   Pes Anserine tendon 1   Infrapatellar fat pad 0   Fibular head 1   Popliteal fossa 0   (Blank rows = not tested) Graded on 0-4 scale (0 = no pain, 1 = pain, 2 = pain with wincing/grimacing/flinching, 3 = pain with withdrawal, 4 = unwilling to allow palpation), (Blank rows = not tested)  VASCULAR Deferred  SPECIAL TESTS  Ligamentous Stability  ACL: Lachman's: R: Not examined L: Negative Active Lachman's: R: Not examined L: Negative Anterior Drawer: R: Not examined L: Negative Pivot Shift: R: Not examined L:  Negative  PCL: Posterior Drawer: R: Not examined L: Negative Reverse Lachman's: R: Not examined L: Negative Posterior Sag Sign: R: Not examined L: Negative  MCL: Valgus Stress (30 degrees flexion): R: Not examined L: Negative  LCL: Varus Stress (30 degrees flexion): R: Not examined L: Negative  Meniscus Tests McMurray's Test:  Medial Meniscus (Tibial ER): R: Not examined L: Negative Lateral Meniscus (Tibial IR): R: Not examined L: Negative Thessaly: R: Not examined L: Not examined Ege's: R: Not examined L: Not examined Effusion: R: Not examined L: Negative   Patellofemoral Pain Syndrome Patellar Tilt (Lateral): R: Not examined L: Negative Patellar Apprehension: R: Not examined L: Not examined Squatting pain: R: Not examined L: Positive Stair climbing pain: R: Not examined L: Positive Kneeling pain: R: Not examined L: Positive Resisted knee extension pain: R: Not examined L: Negative Compression: R: Not examined L: Positive  Patellar Tendinopathy Inferior pole palpation with anterior tilt: R: Not examined L: Negative  Passive Accessory Intervertebral Motion Deferred  Hip: FABER (SN 81): R: Positive L: Negative FADIR (SN 94): R: Positive L:  Negative Hip scour (SN 50): R: Negative L: Negative Figure 4: R: Negative L: Positive for lateral L hip pain  SIJ:  Thigh Thrust (SN 88, -LR 0.18) : R: Not examined L: Not examined  Piriformis Syndrome: FAIR Test (SN 88, SP 83): R: Not examined L: Not examined  Functional Tasks Deferred   Beighton Scale: Deferred, denies history of hypermobility   TODAY'S TREATMENT    SUBJECTIVE: Pt reports that she is doing alright today. She has been having some more L lateral ankle/foot pain recently which makes walking difficult. No resting L knee pain upon arrival but she continues to have a lot of radicular pain in the L upper thigh. No specific questions currently.    PAIN: L radicular thigh pain, L lateral ankle  pain;   Ther-ex NuStep L4-6 (seated 9, arms 10) x 10 minutes for BLE strengthening and warm-up and LE strengthening during interval history (5 minutes unbilled); Isometric L ankle eversion 10s hold x 10; Isometric L ankle dorsiflexion 10s hold x 10; Supine SLR with 4# ankle weight (AW) 2 x 10 BLE; Supine L SAQ over bolster with heavy manual resistance 2 x 10; Hooklying clams with manual resistance 2 x 10; Hooklying adductor squeeze with manual resistance 2 x 10;   Manual Therapy  Assessment of L ankle with pain to palpation along peroneus longus/brevis muscles and tendons including insertion at base of 5th metatarsal. Pain with palpation at sinus tarsi and anterior tibialis muscle. Pain with resisted L ankle eversion and passive inversion. Symptoms consistent with possible peroneal tendonitis and sinus tarsitis.  L ankle subtalar distraction, grade III-IV, 30s/bout x 3 bouts; L ankle long axis distraction manipulation; L sital fibula on tibia A/P mobilizations, grade III, 30s/bout x 3 bouts; STM to L peroneus longus, brevis, and anterior tibialis;   Not performed:  Sidelying hip abduction with 4# AW 2 x 10 BLE; Sidelying reverse clam with 4# AW 2 x 10 BLE; Total Gym (TG) Level 22 (L22) double leg squats with 2, 9# dumbbells (DB) 2 x 15; TG L22 double leg heel raises with 2, 9# DB 2 x 15; TG L22 single leg partial squat 2 x 10 BLE; TRX squats x 10, cues for proper form/technique; TRX lateral lunges x 10 toward each side; TRX split squats with 2 Airex pads under rear knee x 5 on each side; Seated LAQ with 5# AW x 15 BLE; Standing exercises with 5# ankle weights (AW): Hip flexion marches x 15 BLE; Hip abduction x 15 BLE; Hamstring curls x 15 BLE; Hip extension x 15 BLE;   PATIENT EDUCATION:  Education details: Pt educated throughout session about proper posture and technique with exercises, HEP.  Improved exercise technique, movement at target joints, use of target muscles after  min to mod verbal, visual, tactile cues.  Person educated: Patient Education method: Explanation Education comprehension: verbalized understanding   HOME EXERCISE PROGRAM:  Access Code: [email or web address] URL: [email or web address] Date: 09/07/2022 Prepared by: Janet Berlin  Exercises - Gastroc Stretch on Wall  - 2 x daily - 7 x weekly - 1 sets - 2 reps - 60 hold - Seated Hamstring Stretch  - 2 x daily - 7 x weekly - 1 sets - 2 reps - 60 hold - Modified Thomas Stretch  - 2 x daily - 7 x weekly - 1 sets - 2- reps - 60 hold - Sit to Stand  - 1 x daily - 7 x weekly - 2 sets - 15 reps   GOALS:  Goals reviewed with patient? Yes  SHORT TERM GOALS: Target date: 09/08/2022   Pt will be independent with HEP to improve strength and decrease knee pain to improve pain-free function at home and work. Baseline:  Goal status: INITIAL   LONG TERM GOALS: Target date: 10/06/2022   Pt will increase FOTO to at least 58 to demonstrate significant improvement in function at home and work related to knee pain  Baseline: 08/11/22: 47 Goal status: INITIAL  2.  Pt will be able to bend, squat, and walk across an uneven yard without an increase in her L knee pain in order to resume working in her yard/garden as well as return to walking for exercise with less pain Baseline:  Goal status: INITIAL  3.  Pt will increase LEFS score by at least 9 points in order demonstrate clinically significant reduction in knee pain/disability.       Baseline: 08/11/22: 43/80 Goal status: INITIAL  4.  Pt will increase strength of L hip abduction to within 10% of R side in order to demonstrate improvement in strength and function and improved stability at left knee . Baseline: 08/11/22: To be completed; 08/24/22: Hip abduction R/L: 26.2#/17.7#; Goal status: INITIAL   ASSESSMENT:  CLINICAL IMPRESSION: Patient demonstrates excellent motivation during session today. She is reporting recent L lateral ankle/foot pain which  makes walking difficult. Examination of foot/ankle is consistent with possible peroneal tendonitis and sinus tarsitis. Performed manual techniques and strengthening today in order to improve ankle pain so that she can progress her L knee strengthening. Repeated mat table strengthening. Pt encouraged to follow-up as scheduled and continue her HEP. Plan to progress strengthening during future sessions. She will need a progress note at next appointment. Pt will benefit from PT services to address deficits in strength, mobility, and pain in order to improve function at home and with leisure activities.   OBJECTIVE IMPAIRMENTS: Abnormal gait, decreased strength, and pain.   ACTIVITY LIMITATIONS: lifting, standing, squatting, and stairs  PARTICIPATION LIMITATIONS: cleaning, shopping, community activity, occupation, and yard work  PERSONAL FACTORS: Age, Time since onset of injury/illness/exacerbation, and 3+ comorbidities: DM, obesity, OSA, and afib  are also affecting patient's functional outcome.   REHAB POTENTIAL: Fair    CLINICAL DECISION MAKING: Unstable/unpredictable  EVALUATION COMPLEXITY: Moderate   PLAN: PT FREQUENCY: 1-2x/week  PT DURATION: 8 weeks  PLANNED INTERVENTIONS: Therapeutic exercises, Therapeutic activity, Neuromuscular re-education, Balance training, Gait training, Patient/Family education, Self Care, Joint mobilization, Joint manipulation, Vestibular training, Canalith repositioning, Orthotic/Fit training, DME instructions, Dry Needling, Electrical stimulation, Spinal manipulation, Spinal mobilization, Cryotherapy, Moist heat, Taping, Traction, Ultrasound, Ionotophoresis 4mg /ml Dexamethasone, Manual therapy, and Re-evaluation.  PLAN FOR NEXT SESSION: Update outcome measures/goals, progress note, progress strengthening, issue HEP;  Sharalyn Ink Namiah Dunnavant PT, DPT, GCS  9:55 AM,09/28/22

## 2022-09-28 NOTE — Therapy (Signed)
OUTPATIENT PHYSICAL THERAPY KNEE TREATMENT/PROGRESS NOTE  Dates of reporting period  08/11/22   to   09/29/22   Patient Name: Brandy [name] MRN: [id number] DOB:[birth year], 67 y.o., female Today's Date: 09/29/2022  END OF SESSION:  PT End of Session - 09/29/22 0755     Visit Number 10    Number of Visits 17    Date for PT Re-Evaluation 10/06/22    Authorization Type [employer] [facility]    Authorization Time Period eval: 08/11/22    PT Start Time 0800    PT Stop Time 0845    PT Time Calculation (min) 45 min    Activity Tolerance Patient tolerated treatment well    Behavior During Therapy [facility] for tasks assessed/performed            Past Medical History:  Diagnosis Date   Acute lower GI bleeding 01/19/2020   Arthritis    Atrial fibrillation with rapid ventricular response (HCC)    Chicken pox    Chronic systolic CHF (congestive heart failure) (HCC)    a. 03/2016 Echo: Ef 15-20%, diff HK, ant AK;  b. 05/2016 Echo: Ef 30-35%, diff HK, mildly dil LA/RA.   Diabetes mellitus without complication (HCC)    Dysrhythmia    GERD (gastroesophageal reflux disease)    Heart murmur    Hip discomfort    been going on for 40 years    History of kidney stones    Hyperlipidemia associated with type 2 diabetes mellitus (HCC) 05/12/2021   Hypertension    Moderate mitral regurgitation    a. 03/2016 Echo: mod MR in setting of LV dysfxn.   Motion sickness    car - back seat   NICM (nonischemic cardiomyopathy) (HCC)    a. 03/2016 Echo: EF 15-20%, diff HK, ant AK, mod MR, mod dil LA, mildly dil RA;  b. 04/2016 Cath: nl cors;  c. 05/2016 Echo: EF 30-35%, diff HK.   Obesity    Obstructive sleep apnea    compliant with CPAP   Persistent atrial fibrillation (HCC)    a. Dx 03/2016;  b. CHA2DS2VASc = 2-->Eliquis 5mg  BID;  b. 05/2016 Failed DCCV x 4.   Sleep apnea    Stomach irritation    Visit for monitoring Tikosyn therapy 07/24/2016   Past Surgical History:  Procedure Laterality Date   ATRIAL  FIBRILLATION ABLATION N/A 12/09/2020   Procedure: ATRIAL FIBRILLATION ABLATION;  Surgeon: Lanier Prude, MD;  Location: [facility] INVASIVE [facility];  Service: Cardiovascular;  Laterality: N/A;   BIOPSY N/A 01/12/2020   Procedure: BIOPSY;  Surgeon: Pasty Spillers, MD;  Location: [facility];  Service: Endoscopy;  Laterality: N/A;   CARDIAC CATHETERIZATION N/A 04/17/2016   Procedure: Left Heart Cath and Coronary Angiography;  Surgeon: Iran Ouch, MD;  Location: [facility] INVASIVE CV LAB;  Service: Cardiovascular;  Laterality: N/A;   CARDIOVERSION N/A 10/22/2020   Procedure: CARDIOVERSION;  Surgeon: Yvonne Kendall, MD;  Location: [facility] ORS;  Service: Cardiovascular;  Laterality: N/A;   CHOLECYSTECTOMY     COLON SURGERY  2021   COLONOSCOPY N/A 01/20/2020   Procedure: COLONOSCOPY;  Surgeon: Regis Bill, MD;  Location: [facility] ENDOSCOPY;  Service: Endoscopy;  Laterality: N/A;   COLONOSCOPY WITH PROPOFOL N/A 01/12/2020   Procedure: COLONOSCOPY WITH PROPOFOL;  Surgeon: Pasty Spillers, MD;  Location: [facility];  Service: Endoscopy;  Laterality: N/A;  Diabetic - oral meds sleep apnea   ELECTROPHYSIOLOGIC STUDY N/A 05/26/2016   Procedure: Cardioversion;  Surgeon: Iran Ouch, MD;  Location: [facility] ORS;  Service: Cardiovascular;  Laterality: N/A;   ESOPHAGOGASTRODUODENOSCOPY (EGD) WITH PROPOFOL N/A 01/12/2020   Procedure: ESOPHAGOGASTRODUODENOSCOPY (EGD) WITH PROPOFOL;  Surgeon: Pasty Spillers, MD;  Location: [facility];  Service: Endoscopy;  Laterality: N/A;   POLYPECTOMY N/A 01/12/2020   Procedure: POLYPECTOMY;  Surgeon: Pasty Spillers, MD;  Location: [facility];  Service: Endoscopy;  Laterality: N/A;   Patient Active Problem List   Diagnosis Date Noted   Left lumbar radiculopathy 07/26/2022   Arthralgia of left knee 07/26/2022   Pes anserinus bursitis of right knee 04/19/2022   Primary osteoarthritis of right knee 03/27/2022   Greater  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 03/27/2022   It band syndrome, right 03/27/2022   Hyperlipidemia associated with type 2 diabetes mellitus (HCC) 05/12/2021   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   Varicose veins of leg with pain, bilateral 07/06/2020   Acquired thrombophilia (HCC) 05/24/2020   Type II diabetes mellitus with complication (HCC) 01/19/2020   Gastric polyp    Colon polyps    Varicose veins of both lower extremities 10/20/2019   Dysphagia 10/20/2019   Osteoarthritis of right hand 10/31/2016   Nonischemic cardiomyopathy (HCC) 04/14/2016   BMI 33.0-33.9,adult 04/14/2016   PCP: Dr. Bari Edward  REFERRING PROVIDER: Dr. Joseph Berkshire  REFERRING DIAG: Arthralgia of left knee  RATIONALE FOR EVALUATION AND TREATMENT: Rehabilitation  THERAPY DIAG: Chronic pain of left knee  ONSET DATE: Ongoing for years, pt unsure of start date  FOLLOW-UP APPT SCHEDULED WITH REFERRING PROVIDER: Yes   FROM INITIAL EVALUATION SUBJECTIVE:                                                                                                                                                                                         SUBJECTIVE STATEMENT:  L knee pain  PERTINENT HISTORY:  Pt reports chronic L knee pain for multiple years. No known trauma and no prior history of surgery. Pain is mostly located in the inside portion of her knee. She saw [name] who ordered plain films which showed severe medial and mild-to-moderate patellofemoral compartment osteoarthritis. At the time of her evaluation it was felt that her clinical features were most consistent with medial tibiofemoral and patellofemaral arthralgia and possible chronic MCL sprains. She was prescribed a regimen of diclofenac, educated in activity modification, and advised to return in 4 weeks to consider intra-articular steroid injection if symptoms have not improved. It was also recommended that pt work  with physical therapy. At this time pt would like to defer a steroid injection due to concerns of her blood  sugar regulation. Pt was previously seen by physical therapy for R hip and R knee pain which responded well to therapy. She also has a history of chronic low back pain which started initially 33 years ago when she was pregnant. She was prescribed Gabapentin and also advised to start physical therapy as symptoms will allow.  History from 05/11/22 Pt reports history of chronic R hip pain for decades with recent onset of R knee pain in August 2023. No known trauma but pt thinks she might have aggravated her knee walking across uneven grass. She saw [name] and eventually had steroid injections in her R greater trochanter, R knee injection, and R pes bursa on 04/18/22. Radiographs (03/14/22) of R knee showed mild degenerative changes at the patellofemoral joint, worse laterally. She receives chiropractic care every 2 weeks for her low back/hip pain and also sees massage therapy once/month.   L knee radiographs: 07/26/22: IMPRESSION: Severe medial and mild-to-moderate patellofemoral compartment osteoarthritis.  PAIN:    Pain Intensity: Present: 3/10, Best: 0/10, Worst: 8/10 Pain location: Medial L knee pain radiating up inner thigh and down to the L foot; Pain Quality: sharp Radiating: Yes, up the medial L thigh and down to the L foot, occasional L toe throbbing  Numbness/Tingling: Yes, lateral L knee; Focal Weakness: Yes, L knee feels unstable and buckles, no hyperextension Aggravating factors: Walking too fast, extended standing, standing after extended sitting, steps; Relieving factors: resting, ice, elevation, heat, antiinflammatories, knee brace; 24-hour pain behavior: worsens with activity as day progresses History of prior back, hip, knee injury, pain, surgery, or therapy: Yes, history of chronic back, R hip/knee pain, and L hip pain. No prior history of orthopedic surgery to back, hip,  or knee. Dominant hand: right Imaging: Yes, see history Red flags: Negative for bowel/bladder changes, saddle paresthesia, personal history of cancer, h/o spinal tumors, h/o compression fx, h/o abdominal aneurysm, abdominal pain, chills/fever, night sweats, nausea, vomiting, and unrelenting pain;  PRECAUTIONS: None  WEIGHT BEARING RESTRICTIONS: No  FALLS: Has patient fallen in last 6 months? 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   Hobbies: Pottery (currently not limited but initially had trouble sitting at the wheel), gardening/yard work;  Patient Goals: walking across the yard with improved confidence, lifting heavier objects at home without pain in hip, working the yard, "I would like to be able to get back to walking."  OBJECTIVE:  Patient Surveys  LEFS To be completed FOTO 47, predicted improvement to 34  Cognition Patient is oriented to person, place, and time.  Recent memory is intact.  Remote memory is intact.  Attention span and concentration are intact.  Expressive speech is intact.  Patient's fund of knowledge is within normal limits for educational level.    Gross Musculoskeletal Assessment Tremor: None Bulk: Normal Tone: Normal  GAIT: Full gait assessment deferred however pt does demonstrate medial whip of R heel and bilateral ankle pronation during gait. No excessive frontal plane motion of pelvis noted. Gait does appears slightly antalgic  Posture:  Forward head/rounded shoulders in sitting, full posture assessment deferred;  AROM AROM (Normal range in degrees) AROM   Lumbar   Flexion (65) Moderate loss of segmental flexion  Extension (  30) Mild loss   Right lateral flexion (25) Mild loss  Left lateral flexion (25) Mild loss  Right rotation (30) Mild loss  Left rotation (30) Mild loss   AROM (Normal range in degrees) AROM    Hip Right Left  Flexion (125) WNL WNL  Extension (15)    Abduction (40) 25 25  Adduction     Internal Rotation (45)    External Rotation (45)        Knee    Flexion (135) 125*, soft end feel 125*, soft end feel  Extension (0) 0 0      Ankle    Dorsiflexion (20)    (* = pain; Blank rows = not tested)  LE MMT: MMT (out of 5) Right  Left   Hip flexion 5 4+  Hip extension    Hip abduction    Hip adduction    Hip internal rotation 5 4+  Hip external rotation 5 4+  Knee flexion    Knee extension 5 5  Ankle dorsiflexion 5 4+  Ankle plantarflexion Stron5 Strong  (* = pain; Blank rows = not tested)  Muscle Length Hamstrings: R: Positive for tightness at 80 degrees L: Positive for tightness at 80 degrees  Quadriceps Michela Pitcher): R: Not examined L: Not examined Hip flexors Maisie Fus): R: Not examined L: Not examined IT band Claiborne Rigg): R: Not examined L: Not examined  Palpation Location LEFT  RIGHT           Quadriceps 0   Medial Hamstrings 0   Lateral Hamstrings 0   Lateral Hamstring tendon 1   Medial Hamstring tendon 1   Quadriceps tendon 0   Patella 0   Patellar Tendon 0   Tibial Tuberosity 1   Medial joint line 1   Lateral joint line 1   MCL 1   LCL 1   Adductor Tubercle 1   Pes Anserine tendon 1   Infrapatellar fat pad 0   Fibular head 1   Popliteal fossa 0   (Blank rows = not tested) Graded on 0-4 scale (0 = no pain, 1 = pain, 2 = pain with wincing/grimacing/flinching, 3 = pain with withdrawal, 4 = unwilling to allow palpation), (Blank rows = not tested)  SPECIAL TESTS  Ligamentous Stability  ACL: Lachman's: R: Not examined L: Negative Active Lachman's: R: Not examined L: Negative Anterior Drawer: R: Not examined L: Negative Pivot Shift: R: Not examined L: Negative  PCL: Posterior  Drawer: R: Not examined L: Negative Reverse Lachman's: R: Not examined L: Negative Posterior Sag Sign: R: Not examined L: Negative  MCL: Valgus Stress (30 degrees flexion): R: Not examined L: Negative  LCL: Varus Stress (30 degrees flexion): R: Not examined L: Negative  Meniscus Tests McMurray's Test:  Medial Meniscus (Tibial ER): R: Not examined L: Negative Lateral Meniscus (Tibial IR): R: Not examined L: Negative Thessaly: R: Not examined L: Not examined Ege's: R: Not examined L: Not examined Effusion: R: Not examined L: Negative   Patellofemoral Pain Syndrome Patellar Tilt (Lateral): R: Not examined L: Negative Patellar Apprehension: R: Not examined L: Not examined Squatting pain: R: Not examined L: Positive Stair climbing pain: R: Not examined L: Positive Kneeling pain: R: Not examined L: Positive Resisted knee extension pain: R: Not examined L: Negative Compression: R: Not examined L: Positive  Patellar Tendinopathy Inferior pole palpation with anterior tilt: R: Not examined L: Negative  Passive Accessory Intervertebral Motion Deferred  Hip: FABER (SN 81): R: Positive L: Negative FADIR (  SN 94): R: Positive L: Negative Hip scour (SN 50): R: Negative L: Negative Figure 4: R: Negative L: Positive for lateral L hip pain  SIJ:  Thigh Thrust (SN 88, -LR 0.18) : R: Not examined L: Not examined  Piriformis Syndrome: FAIR Test (SN 88, SP 83): R: Not examined L: Not examined  Functional Tasks Deferred   Beighton Scale: Deferred, denies history of hypermobility   TODAY'S TREATMENT    SUBJECTIVE: Pt reports that she is doing alright today. She has continued with severe L lateral ankle/foot pain recently which has made walking difficult. She has been icing the ankle aggressively which helps. No resting L knee or thigh pain upon arrival. No specific questions currently.    PAIN: L lateral ankle pain 2/10;   Ther-ex NuStep L4-6 (seated 9, arms 10) x 10 minutes for  BLE strengthening and warm-up and LE strengthening during interval history (5 minutes unbilled); Updated outcome measures: LEFS: 43/80 FOTO: 47 Hip abduction strength R/L: 25.5, 24, 27.0 (25.5#) /22.5, 20.0, 22.0 (21.5#); Isometric L ankle eversion 10s hold x 10; Isometric L ankle dorsiflexion 10s hold x 10;   Manual Therapy  L ankle subtalar distraction, grade III-IV, 30s/bout x 3 bouts; L talocrural A/P mobilizations and available end range dorsiflexion, grade III, 30s/bout x 3 bouts; STM to L peroneus longus and brevis;   Trigger Point Dry Needling (TDN), unbilled Education performed with patient regarding potential benefit of TDN. Reviewed precautions and risks with patient. Pt provided verbal consent to treatment. Using clean technique TDN performed to L peroneus longus/brevis with 4, 0.25 x 40 single needle placements with local deep ache reported. Pistoning technique utilized.    Not performed:  Supine SLR with 4# ankle weight (AW) 2 x 10 BLE; Supine L SAQ over bolster with heavy manual resistance 2 x 10; Hooklying clams with manual resistance 2 x 10; Hooklying adductor squeeze with manual resistance 2 x 10; Sidelying hip abduction with 4# AW 2 x 10 BLE; Sidelying reverse clam with 4# AW 2 x 10 BLE; Total Gym (TG) Level 22 (L22) double leg squats with 2, 9# dumbbells (DB) 2 x 15; TG L22 double leg heel raises with 2, 9# DB 2 x 15; TG L22 single leg partial squat 2 x 10 BLE; TRX squats x 10, cues for proper form/technique; TRX lateral lunges x 10 toward each side; TRX split squats with 2 Airex pads under rear knee x 5 on each side; Seated LAQ with 5# AW x 15 BLE; Standing exercises with 5# ankle weights (AW): Hip flexion marches x 15 BLE; Hip abduction x 15 BLE; Hamstring curls x 15 BLE; Hip extension x 15 BLE;   PATIENT EDUCATION:  Education details: Pt educated throughout session about proper posture and technique with exercises. Improved exercise technique, movement  at target joints, use of target muscles after min to mod verbal, visual, tactile cues. Outcome measures and progress Person educated: Patient Education method: Explanation Education comprehension: verbalized understanding   HOME EXERCISE PROGRAM:  Access Code: [email or web address] URL: [email or web address] Date: 09/07/2022 Prepared by: Janet Berlin  Exercises - Gastroc Stretch on Wall  - 2 x daily - 7 x weekly - 1 sets - 2 reps - 60 hold - Seated Hamstring Stretch  - 2 x daily - 7 x weekly - 1 sets - 2 reps - 60 hold - Modified Thomas Stretch  - 2 x daily - 7 x weekly - 1 sets - 2- reps - 60 hold - Sit to Stand  -  1 x daily - 7 x weekly - 2 sets - 15 reps   GOALS: Goals reviewed with patient? Yes  SHORT TERM GOALS: Target date: 09/08/2022   Pt will be independent with HEP to improve strength and decrease knee pain to improve pain-free function at home and work. Baseline:  Goal status: INITIAL   LONG TERM GOALS: Target date: 10/06/2022   Pt will increase FOTO to at least 58 to demonstrate significant improvement in function at home and work related to knee pain  Baseline: 08/11/22: 47; 09/29/22: 47; Goal status: ONGOING  2.  Pt will be able to bend, squat, and walk across an uneven yard without an increase in her L knee pain in order to resume working in her yard/garden as well as return to walking for exercise with less pain Baseline: 09/29/22: Slight improvement Goal status: PARTIALLY MET  3.  Pt will increase LEFS score by at least 9 points in order demonstrate clinically significant reduction in knee pain/disability.       Baseline: 08/11/22: 43/80; 09/29/22: 43/80; Goal status: ONGOING  4.  Pt will increase strength of L hip abduction to within 10% of R side in order to demonstrate improvement in strength and function and improved stability at left knee . Baseline: 08/11/22: To be completed; 08/24/22: Hip abduction R/L: 26.2#/17.7#; 09/29/22: Hip abduction R/L: 25.5, 24, 27.0  (25.5#) /22.5, 20.0, 22.0 (21.5#); Goal status: PARTIALLY MET   ASSESSMENT:  CLINICAL IMPRESSION: Patient demonstrates excellent motivation during session today. Updated outcome measures with patient. Her LEFS and FOTO remain unchanged since the initial evaluation. However she reports slight improvement in her ability to bend, squat, and walk across an uneven yard with respect to her L knee pain. Her L hip abduction strength improved from 17.7# to 21.5#. She is reporting continued recent L lateral ankle/foot pain which makes walking difficult. Examination of foot/ankle was consistent with possible peroneal tendonitis and sinus tarsitis. Performed manual techniques and strengthening again today and also introduced trigger point dry needling to peroneus longus/brevis. Focused on L ankle pain in order to allow pt to progress her L knee strengthening. Pt encouraged to follow-up as scheduled and continue her HEP. Plan to progress strengthening during future sessions. Pt will benefit from PT services to address deficits in strength, mobility, and pain in order to improve function at home and with leisure activities.   OBJECTIVE IMPAIRMENTS: Abnormal gait, decreased strength, and pain.   ACTIVITY LIMITATIONS: lifting, standing, squatting, and stairs  PARTICIPATION LIMITATIONS: cleaning, shopping, community activity, occupation, and yard work  PERSONAL FACTORS: Age, Time since onset of injury/illness/exacerbation, and 3+ comorbidities: DM, obesity, OSA, and afib  are also affecting patient's functional outcome.   REHAB POTENTIAL: Fair    CLINICAL DECISION MAKING: Unstable/unpredictable  EVALUATION COMPLEXITY: Moderate   PLAN: PT FREQUENCY: 1-2x/week  PT DURATION: 8 weeks  PLANNED INTERVENTIONS: Therapeutic exercises, Therapeutic activity, Neuromuscular re-education, Balance training, Gait training, Patient/Family education, Self Care, Joint mobilization, Joint manipulation, Vestibular training,  Canalith repositioning, Orthotic/Fit training, DME instructions, Dry Needling, Electrical stimulation, Spinal manipulation, Spinal mobilization, Cryotherapy, Moist heat, Taping, Traction, Ultrasound, Ionotophoresis 4mg /ml Dexamethasone, Manual therapy, and Re-evaluation.  PLAN FOR NEXT SESSION: Progress strengthening, review/modify HEP as needed;  Lynnea Maizes PT, DPT, GCS  12:33 PM,09/29/22

## 2022-09-29 ENCOUNTER — Ambulatory Visit: Payer: Medicare Other

## 2022-09-29 DIAGNOSIS — G8929 Other chronic pain: Secondary | ICD-10-CM

## 2022-09-29 DIAGNOSIS — M25562 Pain in left knee: Secondary | ICD-10-CM | POA: Diagnosis not present

## 2022-09-29 NOTE — Therapy (Signed)
OUTPATIENT PHYSICAL THERAPY KNEE TREATMENT  Patient Name: Brandy [name] MRN: [id number] DOB:May 04, 1956, 67 y.o., female Today's Date: 10/03/2022  END OF SESSION:  PT End of Session - 10/03/22 1106     Visit Number 11    Number of Visits 17    Date for PT Re-Evaluation 10/06/22    Authorization Type [employer] [facility] [employer] [facility]    Authorization Time Period eval: 08/11/22    PT Start Time 1100    PT Stop Time 1145    PT Time Calculation (min) 45 min    Activity Tolerance Patient tolerated treatment well    Behavior During Therapy [facility] for tasks assessed/performed            Past Medical History:  Diagnosis Date   Acute lower GI bleeding 01/19/2020   Arthritis    Atrial fibrillation with rapid ventricular response (HCC)    Chicken pox    Chronic systolic CHF (congestive heart failure) (HCC)    a. 03/2016 Echo: Ef 15-20%, diff HK, ant AK;  b. 05/2016 Echo: Ef 30-35%, diff HK, mildly dil LA/RA.   Diabetes mellitus without complication (HCC)    Dysrhythmia    GERD (gastroesophageal reflux disease)    Heart murmur    Hip discomfort    been going on for 40 years    History of kidney stones    Hyperlipidemia associated with type 2 diabetes mellitus (HCC) 05/12/2021   Hypertension    Moderate mitral regurgitation    a. 03/2016 Echo: mod MR in setting of LV dysfxn.   Motion sickness    car - back seat   NICM (nonischemic cardiomyopathy) (HCC)    a. 03/2016 Echo: EF 15-20%, diff HK, ant AK, mod MR, mod dil LA, mildly dil RA;  b. 04/2016 Cath: nl cors;  c. 05/2016 Echo: EF 30-35%, diff HK.   Obesity    Obstructive sleep apnea    compliant with CPAP   Persistent atrial fibrillation (HCC)    a. Dx 03/2016;  b. CHA2DS2VASc = 2-->Eliquis 5mg  BID;  b. 05/2016 Failed DCCV x 4.   Sleep apnea    Stomach irritation    Visit for monitoring Tikosyn therapy 07/24/2016   Past Surgical History:  Procedure Laterality Date   ATRIAL FIBRILLATION ABLATION N/A 12/09/2020   Procedure: ATRIAL  FIBRILLATION ABLATION;  Surgeon: Lanier Prude, MD;  Location: [facility] INVASIVE CV LAB;  Service: Cardiovascular;  Laterality: N/A;   BIOPSY N/A 01/12/2020   Procedure: BIOPSY;  Surgeon: Pasty Spillers, MD;  Location: [facility];  Service: Endoscopy;  Laterality: N/A;   CARDIAC CATHETERIZATION N/A 04/17/2016   Procedure: Left Heart Cath and Coronary Angiography;  Surgeon: Iran Ouch, MD;  Location: [facility] INVASIVE CV LAB;  Service: Cardiovascular;  Laterality: N/A;   CARDIOVERSION N/A 10/22/2020   Procedure: CARDIOVERSION;  Surgeon: Yvonne Kendall, MD;  Location: [facility] ORS;  Service: Cardiovascular;  Laterality: N/A;   CHOLECYSTECTOMY     COLON SURGERY  2021   COLONOSCOPY N/A 01/20/2020   Procedure: COLONOSCOPY;  Surgeon: Regis Bill, MD;  Location: [facility] ENDOSCOPY;  Service: Endoscopy;  Laterality: N/A;   COLONOSCOPY WITH PROPOFOL N/A 01/12/2020   Procedure: COLONOSCOPY WITH PROPOFOL;  Surgeon: Pasty Spillers, MD;  Location: [facility];  Service: Endoscopy;  Laterality: N/A;  Diabetic - oral meds sleep apnea   ELECTROPHYSIOLOGIC STUDY N/A 05/26/2016   Procedure: Cardioversion;  Surgeon: Iran Ouch, MD;  Location: [facility] ORS;  Service: Cardiovascular;  Laterality: N/A;  ESOPHAGOGASTRODUODENOSCOPY (EGD) WITH PROPOFOL N/A 01/12/2020   Procedure: ESOPHAGOGASTRODUODENOSCOPY (EGD) WITH PROPOFOL;  Surgeon: Pasty Spillers, MD;  Location: [facility];  Service: Endoscopy;  Laterality: N/A;   POLYPECTOMY N/A 01/12/2020   Procedure: POLYPECTOMY;  Surgeon: Pasty Spillers, MD;  Location: [facility] SUR[facility]ERY [facility];  Service: Endoscopy;  Laterality: N/A;   Patient Active Problem List   Diagnosis Date Noted   Left lumbar radiculopathy 07/26/2022   Arthralgia of left knee 07/26/2022   Pes anserinus bursitis of right knee 04/19/2022   Primary osteoarthritis of right knee 03/27/2022   Greater 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 03/27/2022    It band syndrome, right 03/27/2022   Hyperlipidemia associated with type 2 diabetes mellitus (HCC) 05/12/2021   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   Varicose veins of leg with pain, bilateral 07/06/2020   Acquired thrombophilia (HCC) 05/24/2020   Type II diabetes mellitus with complication (HCC) 01/19/2020   Gastric polyp    Colon polyps    Varicose veins of both lower extremities 10/20/2019   Dysphagia 10/20/2019   Osteoarthritis of right hand 10/31/2016   Nonischemic cardiomyopathy (HCC) 04/14/2016   BMI 33.0-33.9,adult 04/14/2016   PCP: Dr. Bari Edward  REFERRING PROVIDER: Dr. Joseph Berkshire  REFERRING DIAG: Arthralgia of left knee  RATIONALE FOR EVALUATION AND TREATMENT: Rehabilitation  THERAPY DIAG: Chronic pain of left knee  ONSET DATE: Ongoing for years, pt unsure of start date  FOLLOW-UP APPT SCHEDULED WITH REFERRING PROVIDER: Yes   FROM INITIAL EVALUATION SUBJECTIVE:                                                                                                                                                                                         SUBJECTIVE STATEMENT:  L knee pain  PERTINENT HISTORY:  Pt reports chronic L knee pain for multiple years. No known trauma and no prior history of surgery. Pain is mostly located in the inside portion of her knee. She saw [name] who ordered plain films which showed severe medial and mild-to-moderate patellofemoral compartment osteoarthritis. At the time of her evaluation it was felt that her clinical features were most consistent with medial tibiofemoral and patellofemaral arthralgia and possible chronic MCL sprains. She was prescribed a regimen of diclofenac, educated in activity modification, and advised to return in 4 weeks to consider intra-articular steroid injection if symptoms have not improved. It was also recommended that pt work with physical therapy. At this time pt would like to defer a  steroid injection due to concerns of her blood sugar regulation. Pt was previously seen by physical therapy for R hip and R knee pain which  responded well to therapy. She also has a history of chronic low back pain which started initially 33 years ago when she was pregnant. She was prescribed Gabapentin and also advised to start physical therapy as symptoms will allow.  History from 05/11/22 Pt reports history of chronic R hip pain for decades with recent onset of R knee pain in August 2023. No known trauma but pt thinks she might have aggravated her knee walking across uneven grass. She saw [name] and eventually had steroid injections in her R greater trochanter, R knee injection, and R pes bursa on 04/18/22. Radiographs (03/14/22) of R knee showed mild degenerative changes at the patellofemoral joint, worse laterally. She receives chiropractic care every 2 weeks for her low back/hip pain and also sees massage therapy once/month.   L knee radiographs: 07/26/22: IMPRESSION: Severe medial and mild-to-moderate patellofemoral compartment osteoarthritis.  PAIN:    Pain Intensity: Present: 3/10, Best: 0/10, Worst: 8/10 Pain location: Medial L knee pain radiating up inner thigh and down to the L foot; Pain Quality: sharp Radiating: Yes, up the medial L thigh and down to the L foot, occasional L toe throbbing  Numbness/Tingling: Yes, lateral L knee; Focal Weakness: Yes, L knee feels unstable and buckles, no hyperextension Aggravating factors: Walking too fast, extended standing, standing after extended sitting, steps; Relieving factors: resting, ice, elevation, heat, antiinflammatories, knee brace; 24-hour pain behavior: worsens with activity as day progresses History of prior back, hip, knee injury, pain, surgery, or therapy: Yes, history of chronic back, R hip/knee pain, and L hip pain. No prior history of orthopedic surgery to back, hip, or knee. Dominant hand: right Imaging: Yes, see history Red  flags: Negative for bowel/bladder changes, saddle paresthesia, personal history of cancer, h/o spinal tumors, h/o compression fx, h/o abdominal aneurysm, abdominal pain, chills/fever, night sweats, nausea, vomiting, and unrelenting pain;  PRECAUTIONS: None  WEIGHT BEARING RESTRICTIONS: No  FALLS: Has patient fallen in last 6 months? 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   Hobbies: Pottery (currently not limited but initially had trouble sitting at the wheel), gardening/yard work;  Patient Goals: walking across the yard with improved confidence, lifting heavier objects at home without pain in hip, working the yard, "I would like to be able to get back to walking."  OBJECTIVE:  Patient Surveys  LEFS To be completed FOTO 47, predicted improvement to 47  Cognition Patient is oriented to person, place, and time.  Recent memory is intact.  Remote memory is intact.  Attention span and concentration are intact.  Expressive speech is intact.  Patient's fund of knowledge is within normal limits for educational level.    Gross Musculoskeletal Assessment Tremor: None Bulk: Normal Tone: Normal  GAIT: Full gait assessment deferred however pt does demonstrate medial whip of R heel and bilateral ankle pronation during gait. No excessive frontal plane motion of pelvis noted. Gait does appears slightly antalgic  Posture:  Forward head/rounded shoulders in sitting, full posture assessment deferred;  AROM AROM (Normal range in degrees) AROM   Lumbar   Flexion (65) Moderate loss of segmental flexion  Extension (30) Mild loss  Right lateral flexion (25) Mild loss  Left lateral flexion (25)  Mild  loss  Right rotation (30) Mild loss  Left rotation (30) Mild loss   AROM (Normal range in degrees) AROM    Hip Right Left  Flexion (125) WNL WNL  Extension (15)    Abduction (40) 25 25  Adduction     Internal Rotation (45)    External Rotation (45)        Knee    Flexion (135) 125*, soft end feel 125*, soft end feel  Extension (0) 0 0      Ankle    Dorsiflexion (20)    (* = pain; Blank rows = not tested)  LE MMT: MMT (out of 5) Right  Left   Hip flexion 5 4+  Hip extension    Hip abduction    Hip adduction    Hip internal rotation 5 4+  Hip external rotation 5 4+  Knee flexion    Knee extension 5 5  Ankle dorsiflexion 5 4+  Ankle plantarflexion Stron5 Strong  (* = pain; Blank rows = not tested)  Muscle Length Hamstrings: R: Positive for tightness at 80 degrees L: Positive for tightness at 80 degrees  Quadriceps Michela Pitcher): R: Not examined L: Not examined Hip flexors Maisie Fus): R: Not examined L: Not examined IT band Claiborne Rigg): R: Not examined L: Not examined  Palpation Location LEFT  RIGHT           Quadriceps 0   Medial Hamstrings 0   Lateral Hamstrings 0   Lateral Hamstring tendon 1   Medial Hamstring tendon 1   Quadriceps tendon 0   Patella 0   Patellar Tendon 0   Tibial Tuberosity 1   Medial joint line 1   Lateral joint line 1   MCL 1   LCL 1   Adductor Tubercle 1   Pes Anserine tendon 1   Infrapatellar fat pad 0   Fibular head 1   Popliteal fossa 0   (Blank rows = not tested) Graded on 0-4 scale (0 = no pain, 1 = pain, 2 = pain with wincing/grimacing/flinching, 3 = pain with withdrawal, 4 = unwilling to allow palpation), (Blank rows = not tested)  SPECIAL TESTS  Ligamentous Stability  ACL: Lachman's: R: Not examined L: Negative Active Lachman's: R: Not examined L: Negative Anterior Drawer: R: Not examined L: Negative Pivot Shift: R: Not examined L: Negative  PCL: Posterior Drawer: R: Not examined L: Negative Reverse Lachman's: R: Not  examined L: Negative Posterior Sag Sign: R: Not examined L: Negative  MCL: Valgus Stress (30 degrees flexion): R: Not examined L: Negative  LCL: Varus Stress (30 degrees flexion): R: Not examined L: Negative  Meniscus Tests McMurray's Test:  Medial Meniscus (Tibial ER): R: Not examined L: Negative Lateral Meniscus (Tibial IR): R: Not examined L: Negative Thessaly: R: Not examined L: Not examined Ege's: R: Not examined L: Not examined Effusion: R: Not examined L: Negative   Patellofemoral Pain Syndrome Patellar Tilt (Lateral): R: Not examined L: Negative Patellar Apprehension: R: Not examined L: Not examined Squatting pain: R: Not examined L: Positive Stair climbing pain: R: Not examined L: Positive Kneeling pain: R: Not examined L: Positive Resisted knee extension pain: R: Not examined L: Negative Compression: R: Not examined L: Positive  Patellar Tendinopathy Inferior pole palpation with anterior tilt: R: Not examined L: Negative  Passive Accessory Intervertebral Motion Deferred  Hip: FABER (SN 81): R: Positive L: Negative FADIR (SN 94): R: Positive L: Negative Hip scour (SN 50): R: Negative L: Negative Figure 4: R:  Negative L: Positive for lateral L hip pain  SIJ:  Thigh Thrust (SN 88, -LR 0.18) : R: Not examined L: Not examined  Piriformis Syndrome: FAIR Test (SN 88, SP 83): R: Not examined L: Not examined  Functional Tasks Deferred   Beighton Scale: Deferred, denies history of hypermobility   TODAY'S TREATMENT    SUBJECTIVE: Pt reports that she is doing alright today. Her L lateral ankle/foot pain has been improving. She has continued icing the ankle aggressively. No resting L knee or thigh pain upon arrival. No specific questions currently.    PAIN: Denies   Ther-ex NuStep L4-6 (seated 9, arms 10) x 10 minutes for BLE strengthening and warm-up and LE strengthening during interval history (5 minutes unbilled); Supine SLR with 4# ankle weight (AW) 2 x  10 BLE; Hooklying clams with manual resistance 2 x 10; Hooklying adductor squeeze with manual resistance 2 x 10; Supine L SAQ over bolster with heavy manual resistance 2 x 10; Sidelying hip abduction with 4# AW 2 x 10 BLE; Sidelying reverse clam with 4# AW 2 x 10 BLE; TRX squats x 10, cues for proper form/technique; TRX split squats with 1 Airex pad on a 6" step under rear knee x 10 on each side;   Not performed:  Total Gym (TG) Level 22 (L22) double leg squats with 2, 9# dumbbells (DB) 2 x 15; TG L22 double leg heel raises with 2, 9# DB 2 x 15; TG L22 single leg partial squat 2 x 10 BLE; TRX lateral lunges x 10 toward each side; Seated LAQ with 5# AW x 15 BLE; Standing exercises with 5# ankle weights (AW): Hip flexion marches x 15 BLE; Hip abduction x 15 BLE; Hamstring curls x 15 BLE; Hip extension x 15 BLE;   PATIENT EDUCATION:  Education details: Pt educated throughout session about proper posture and technique with exercises. Improved exercise technique, movement at target joints, use of target muscles after min to mod verbal, visual, tactile cues. Person educated: Patient Education method: Explanation Education comprehension: verbalized understanding   HOME EXERCISE PROGRAM:  Access Code: [email or web address] URL: [email or web address] Date: 09/07/2022 Prepared by: Janet Berlin  Exercises - Gastroc Stretch on Wall  - 2 x daily - 7 x weekly - 1 sets - 2 reps - 60 hold - Seated Hamstring Stretch  - 2 x daily - 7 x weekly - 1 sets - 2 reps - 60 hold - Modified Thomas Stretch  - 2 x daily - 7 x weekly - 1 sets - 2- reps - 60 hold - Sit to Stand  - 1 x daily - 7 x weekly - 2 sets - 15 reps   GOALS: Goals reviewed with patient? Yes  SHORT TERM GOALS: Target date: 09/08/2022   Pt will be independent with HEP to improve strength and decrease knee pain to improve pain-free function at home and work. Baseline:  Goal status: INITIAL   LONG TERM GOALS: Target date:  10/06/2022   Pt will increase FOTO to at least 58 to demonstrate significant improvement in function at home and work related to knee pain  Baseline: 08/11/22: 47; 09/29/22: 47; Goal status: ONGOING  2.  Pt will be able to bend, squat, and walk across an uneven yard without an increase in her L knee pain in order to resume working in her yard/garden as well as return to walking for exercise with less pain Baseline: 09/29/22: Slight improvement Goal status: PARTIALLY MET  3.  Pt will increase LEFS score  by at least 9 points in order demonstrate clinically significant reduction in knee pain/disability.       Baseline: 08/11/22: 43/80; 09/29/22: 43/80; Goal status: ONGOING  4.  Pt will increase strength of L hip abduction to within 10% of R side in order to demonstrate improvement in strength and function and improved stability at left knee . Baseline: 08/11/22: To be completed; 08/24/22: Hip abduction R/L: 26.2#/17.7#; 09/29/22: Hip abduction R/L: 25.5, 24, 27.0 (25.5#) /22.5, 20.0, 22.0 (21.5#); Goal status: PARTIALLY MET   ASSESSMENT:  CLINICAL IMPRESSION: Patient demonstrates excellent motivation during session today. Left ankle/foot pain is improving so progressed with LLE strengthening during session. Performed both open and closed chain strengthening pt denies increase in pain during session. Cues provided for proper form with squats. Pt encouraged to follow-up as scheduled and continue her HEP. Plan to progress strengthening during future sessions. She will need a recertification at her next visit. Pt will benefit from PT services to address deficits in strength, mobility, and pain in order to improve function at home and with leisure activities.   OBJECTIVE IMPAIRMENTS: Abnormal gait, decreased strength, and pain.   ACTIVITY LIMITATIONS: lifting, standing, squatting, and stairs  PARTICIPATION LIMITATIONS: cleaning, shopping, community activity, occupation, and yard work  PERSONAL FACTORS:  Age, Time since onset of injury/illness/exacerbation, and 3+ comorbidities: DM, obesity, OSA, and afib  are also affecting patient's functional outcome.   REHAB POTENTIAL: Fair    CLINICAL DECISION MAKING: Unstable/unpredictable  EVALUATION COMPLEXITY: Moderate   PLAN: PT FREQUENCY: 1-2x/week  PT DURATION: 8 weeks  PLANNED INTERVENTIONS: Therapeutic exercises, Therapeutic activity, Neuromuscular re-education, Balance training, Gait training, Patient/Family education, Self Care, Joint mobilization, Joint manipulation, Vestibular training, Canalith repositioning, Orthotic/Fit training, DME instructions, Dry Needling, Electrical stimulation, Spinal manipulation, Spinal mobilization, Cryotherapy, Moist heat, Taping, Traction, Ultrasound, Ionotophoresis 4mg /ml Dexamethasone, Manual therapy, and Re-evaluation.  PLAN FOR NEXT SESSION: Progress strengthening, review/modify HEP as needed;  Lynnea Maizes PT, DPT, GCS  6:15 PM,10/03/22

## 2022-10-03 ENCOUNTER — Ambulatory Visit: Payer: Medicare Other

## 2022-10-03 DIAGNOSIS — G8929 Other chronic pain: Secondary | ICD-10-CM | POA: Diagnosis not present

## 2022-10-03 DIAGNOSIS — M25562 Pain in left knee: Secondary | ICD-10-CM | POA: Diagnosis not present

## 2022-10-04 NOTE — Therapy (Signed)
OUTPATIENT PHYSICAL THERAPY KNEE TREATMENT/RECERTIFICATION  Patient Name: Brandy [name] MRN: [id number] DOB:1955-10-15, 67 y.o., female Today's Date: 10/06/2022  END OF SESSION:  PT End of Session - 10/06/22 0758     Visit Number 12    Number of Visits 33    Date for PT Re-Evaluation 12/01/22    Authorization Type [employer] [facility]    Authorization Time Period eval: 08/11/22    PT Start Time 0800    PT Stop Time 0845    PT Time Calculation (min) 45 min    Activity Tolerance Patient tolerated treatment well    Behavior During Therapy [facility] for tasks assessed/performed            Past Medical History:  Diagnosis Date   Acute lower GI bleeding 01/19/2020   Arthritis    Atrial fibrillation with rapid ventricular response (HCC)    Chicken pox    Chronic systolic CHF (congestive heart failure) (HCC)    a. 03/2016 Echo: Ef 15-20%, diff HK, ant AK;  b. 05/2016 Echo: Ef 30-35%, diff HK, mildly dil LA/RA.   Diabetes mellitus without complication (HCC)    Dysrhythmia    GERD (gastroesophageal reflux disease)    Heart murmur    Hip discomfort    been going on for 40 years    History of kidney stones    Hyperlipidemia associated with type 2 diabetes mellitus (HCC) 05/12/2021   Hypertension    Moderate mitral regurgitation    a. 03/2016 Echo: mod MR in setting of LV dysfxn.   Motion sickness    car - back seat   NICM (nonischemic cardiomyopathy) (HCC)    a. 03/2016 Echo: EF 15-20%, diff HK, ant AK, mod MR, mod dil LA, mildly dil RA;  b. 04/2016 Cath: nl cors;  c. 05/2016 Echo: EF 30-35%, diff HK.   Obesity    Obstructive sleep apnea    compliant with CPAP   Persistent atrial fibrillation (HCC)    a. Dx 03/2016;  b. CHA2DS2VASc = 2-->Eliquis 5mg  BID;  b. 05/2016 Failed DCCV x 4.   Sleep apnea    Stomach irritation    Visit for monitoring Tikosyn therapy 07/24/2016   Past Surgical History:  Procedure Laterality Date   ATRIAL FIBRILLATION ABLATION N/A 12/09/2020   Procedure:  ATRIAL FIBRILLATION ABLATION;  Surgeon: Lanier Prude, MD;  Location: [facility] CV LAB;  Service: Cardiovascular;  Laterality: N/A;   BIOPSY N/A 01/12/2020   Procedure: BIOPSY;  Surgeon: Pasty Spillers, MD;  Location: [facility];  Service: Endoscopy;  Laterality: N/A;   CARDIAC CATHETERIZATION N/A 04/17/2016   Procedure: Left Heart Cath and Coronary Angiography;  Surgeon: Iran Ouch, MD;  Location: [facility] INVASIVE CV LAB;  Service: Cardiovascular;  Laterality: N/A;   CARDIOVERSION N/A 10/22/2020   Procedure: CARDIOVERSION;  Surgeon: Yvonne Kendall, MD;  Location: [facility] ORS;  Service: Cardiovascular;  Laterality: N/A;   CHOLECYSTECTOMY     COLON SURGERY  2021   COLONOSCOPY N/A 01/20/2020   Procedure: COLONOSCOPY;  Surgeon: Regis Bill, MD;  Location: [facility] ENDOSCOPY;  Service: Endoscopy;  Laterality: N/A;   COLONOSCOPY WITH PROPOFOL N/A 01/12/2020   Procedure: COLONOSCOPY WITH PROPOFOL;  Surgeon: Pasty Spillers, MD;  Location: [facility];  Service: Endoscopy;  Laterality: N/A;  Diabetic - oral meds sleep apnea   ELECTROPHYSIOLOGIC STUDY N/A 05/26/2016   Procedure: Cardioversion;  Surgeon: Iran Ouch, MD;  Location: [facility] ORS;  Service: Cardiovascular;  Laterality: N/A;  ESOPHAGOGASTRODUODENOSCOPY (EGD) WITH PROPOFOL N/A 01/12/2020   Procedure: ESOPHAGOGASTRODUODENOSCOPY (EGD) WITH PROPOFOL;  Surgeon: Pasty Spillers, MD;  Location: [facility];  Service: Endoscopy;  Laterality: N/A;   POLYPECTOMY N/A 01/12/2020   Procedure: POLYPECTOMY;  Surgeon: Pasty Spillers, MD;  Location: [facility];  Service: Endoscopy;  Laterality: N/A;   Patient Active Problem List   Diagnosis Date Noted   Left lumbar radiculopathy 07/26/2022   Arthralgia of left knee 07/26/2022   Pes anserinus bursitis of right knee 04/19/2022   Primary osteoarthritis of right knee 03/27/2022   Greater 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  03/27/2022   It band syndrome, right 03/27/2022   Hyperlipidemia associated with type 2 diabetes mellitus (HCC) 05/12/2021   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   Varicose veins of leg with pain, bilateral 07/06/2020   Acquired thrombophilia (HCC) 05/24/2020   Type II diabetes mellitus with complication (HCC) 01/19/2020   Gastric polyp    Colon polyps    Varicose veins of both lower extremities 10/20/2019   Dysphagia 10/20/2019   Osteoarthritis of right hand 10/31/2016   Nonischemic cardiomyopathy (HCC) 04/14/2016   BMI 33.0-33.9,adult 04/14/2016   PCP: Dr. Bari Edward  REFERRING PROVIDER: Dr. Joseph Berkshire  REFERRING DIAG: Arthralgia of left knee  RATIONALE FOR EVALUATION AND TREATMENT: Rehabilitation  THERAPY DIAG: Chronic pain of left knee  ONSET DATE: Ongoing for years, pt unsure of start date  FOLLOW-UP APPT SCHEDULED WITH REFERRING PROVIDER: Yes   FROM INITIAL EVALUATION SUBJECTIVE:                                                                                                                                                                                         SUBJECTIVE STATEMENT:  L knee pain  PERTINENT HISTORY:  Pt reports chronic L knee pain for multiple years. No known trauma and no prior history of surgery. Pain is mostly located in the inside portion of her knee. She saw [name] who ordered plain films which showed severe medial and mild-to-moderate patellofemoral compartment osteoarthritis. At the time of her evaluation it was felt that her clinical features were most consistent with medial tibiofemoral and patellofemaral arthralgia and possible chronic MCL sprains. She was prescribed a regimen of diclofenac, educated in activity modification, and advised to return in 4 weeks to consider intra-articular steroid injection if symptoms have not improved. It was also recommended that pt work with physical therapy. At this time pt would like to  defer a steroid injection due to concerns of her blood sugar regulation. Pt was previously seen by physical therapy for R hip and R knee pain which  responded well to therapy. She also has a history of chronic low back pain which started initially 33 years ago when she was pregnant. She was prescribed Gabapentin and also advised to start physical therapy as symptoms will allow.  History from 05/11/22 Pt reports history of chronic R hip pain for decades with recent onset of R knee pain in August 2023. No known trauma but pt thinks she might have aggravated her knee walking across uneven grass. She saw [name] and eventually had steroid injections in her R greater trochanter, R knee injection, and R pes bursa on 04/18/22. Radiographs (03/14/22) of R knee showed mild degenerative changes at the patellofemoral joint, worse laterally. She receives chiropractic care every 2 weeks for her low back/hip pain and also sees massage therapy once/month.   L knee radiographs: 07/26/22: IMPRESSION: Severe medial and mild-to-moderate patellofemoral compartment osteoarthritis.  PAIN:    Pain Intensity: Present: 3/10, Best: 0/10, Worst: 8/10 Pain location: Medial L knee pain radiating up inner thigh and down to the L foot; Pain Quality: sharp Radiating: Yes, up the medial L thigh and down to the L foot, occasional L toe throbbing  Numbness/Tingling: Yes, lateral L knee; Focal Weakness: Yes, L knee feels unstable and buckles, no hyperextension Aggravating factors: Walking too fast, extended standing, standing after extended sitting, steps; Relieving factors: resting, ice, elevation, heat, antiinflammatories, knee brace; 24-hour pain behavior: worsens with activity as day progresses History of prior back, hip, knee injury, pain, surgery, or therapy: Yes, history of chronic back, R hip/knee pain, and L hip pain. No prior history of orthopedic surgery to back, hip, or knee. Dominant hand: right Imaging: Yes, see  history Red flags: Negative for bowel/bladder changes, saddle paresthesia, personal history of cancer, h/o spinal tumors, h/o compression fx, h/o abdominal aneurysm, abdominal pain, chills/fever, night sweats, nausea, vomiting, and unrelenting pain;  PRECAUTIONS: None  WEIGHT BEARING RESTRICTIONS: No  FALLS: Has patient fallen in last 6 months? 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   Hobbies: Pottery (currently not limited but initially had trouble sitting at the wheel), gardening/yard work;  Patient Goals: walking across the yard with improved confidence, lifting heavier objects at home without pain in hip, working the yard, "I would like to be able to get back to walking."  OBJECTIVE:  Patient Surveys  LEFS To be completed FOTO 47, predicted improvement to 19  Cognition Patient is oriented to person, place, and time.  Recent memory is intact.  Remote memory is intact.  Attention span and concentration are intact.  Expressive speech is intact.  Patient's fund of knowledge is within normal limits for educational level.    Gross Musculoskeletal Assessment Tremor: None Bulk: Normal Tone: Normal  GAIT: Full gait assessment deferred however pt does demonstrate medial whip of R heel and bilateral ankle pronation during gait. No excessive frontal plane motion of pelvis noted. Gait does appears slightly antalgic  Posture:  Forward head/rounded shoulders in sitting, full posture assessment deferred;  AROM AROM (Normal range in degrees) AROM   Lumbar   Flexion (65) Moderate loss of segmental flexion  Extension (30) Mild loss  Right lateral flexion (25) Mild loss  Left lateral  flexion (25) Mild  loss  Right rotation (30) Mild loss  Left rotation (30) Mild loss   AROM (Normal range in degrees) AROM    Hip Right Left  Flexion (125) WNL WNL  Extension (15)    Abduction (40) 25 25  Adduction     Internal Rotation (45)    External Rotation (45)        Knee    Flexion (135) 125*, soft end feel 125*, soft end feel  Extension (0) 0 0      Ankle    Dorsiflexion (20)    (* = pain; Blank rows = not tested)  LE MMT: MMT (out of 5) Right  Left   Hip flexion 5 4+  Hip extension    Hip abduction    Hip adduction    Hip internal rotation 5 4+  Hip external rotation 5 4+  Knee flexion    Knee extension 5 5  Ankle dorsiflexion 5 4+  Ankle plantarflexion Stron5 Strong  (* = pain; Blank rows = not tested)  Muscle Length Hamstrings: R: Positive for tightness at 80 degrees L: Positive for tightness at 80 degrees  Quadriceps Michela Pitcher): R: Not examined L: Not examined Hip flexors Maisie Fus): R: Not examined L: Not examined IT band Claiborne Rigg): R: Not examined L: Not examined  Palpation Location LEFT  RIGHT           Quadriceps 0   Medial Hamstrings 0   Lateral Hamstrings 0   Lateral Hamstring tendon 1   Medial Hamstring tendon 1   Quadriceps tendon 0   Patella 0   Patellar Tendon 0   Tibial Tuberosity 1   Medial joint line 1   Lateral joint line 1   MCL 1   LCL 1   Adductor Tubercle 1   Pes Anserine tendon 1   Infrapatellar fat pad 0   Fibular head 1   Popliteal fossa 0   (Blank rows = not tested) Graded on 0-4 scale (0 = no pain, 1 = pain, 2 = pain with wincing/grimacing/flinching, 3 = pain with withdrawal, 4 = unwilling to allow palpation), (Blank rows = not tested)  SPECIAL TESTS  Ligamentous Stability  ACL: Lachman's: R: Not examined L: Negative Active Lachman's: R: Not examined L: Negative Anterior Drawer: R: Not examined L: Negative Pivot Shift: R: Not examined L: Negative  PCL: Posterior Drawer: R: Not examined L: Negative Reverse  Lachman's: R: Not examined L: Negative Posterior Sag Sign: R: Not examined L: Negative  MCL: Valgus Stress (30 degrees flexion): R: Not examined L: Negative  LCL: Varus Stress (30 degrees flexion): R: Not examined L: Negative  Meniscus Tests McMurray's Test:  Medial Meniscus (Tibial ER): R: Not examined L: Negative Lateral Meniscus (Tibial IR): R: Not examined L: Negative Thessaly: R: Not examined L: Not examined Ege's: R: Not examined L: Not examined Effusion: R: Not examined L: Negative   Patellofemoral Pain Syndrome Patellar Tilt (Lateral): R: Not examined L: Negative Patellar Apprehension: R: Not examined L: Not examined Squatting pain: R: Not examined L: Positive Stair climbing pain: R: Not examined L: Positive Kneeling pain: R: Not examined L: Positive Resisted knee extension pain: R: Not examined L: Negative Compression: R: Not examined L: Positive  Patellar Tendinopathy Inferior pole palpation with anterior tilt: R: Not examined L: Negative  Passive Accessory Intervertebral Motion Deferred  Hip: FABER (SN 81): R: Positive L: Negative FADIR (SN 94): R: Positive L: Negative Hip scour (SN 50): R: Negative L: Negative Figure 4: R:  Negative L: Positive for lateral L hip pain  SIJ:  Thigh Thrust (SN 88, -LR 0.18) : R: Not examined L: Not examined  Piriformis Syndrome: FAIR Test (SN 88, SP 83): R: Not examined L: Not examined  Functional Tasks Deferred   Beighton Scale: Deferred, denies history of hypermobility   TODAY'S TREATMENT    SUBJECTIVE: Pt reports that she is doing alright today. Her L lateral ankle/foot pain has continued improving. No resting L knee or thigh pain upon arrival. She is still taking the gabapentin. She is trying to decide which NSIAD to continue for pain management.   PAIN: Denies   Ther-ex NuStep L3-5 (seated 9, arms 10) x 10 minutes for BLE strengthening and warm-up and LE strengthening during interval history and medication  discussion (5 minutes unbilled); Total Gym (TG) Level 22 (L22) double leg squats with 2, 9# dumbbells (DB) 2 x 15; TG L22 double leg heel raises with 2, 9# DB 2 x 15; TG L22 single leg partial squat LLE x 10, x 15, RLE x 10, x 15; TRX split squats with 1 Airex pad on a 6" step under rear knee 2 x 10 on each side; Resisted side stepping with blue tband around ankles x 6 lengths; Seated LAQ with 7.5# ankle weights (AW) x 10 BLE;   Not performed:  TRX lateral lunges x 10 toward each side; Seated LAQ with 5# AW x 15 BLE; Standing exercises with 5# ankle weights (AW): Hip flexion marches x 15 BLE; Hip abduction x 15 BLE; Hamstring curls x 15 BLE; Hip extension x 15 BLE; Supine SLR with 4# ankle weight (AW) 2 x 10 BLE; Hooklying clams with manual resistance 2 x 10; Hooklying adductor squeeze with manual resistance 2 x 10; Supine L SAQ over bolster with heavy manual resistance 2 x 10; Sidelying hip abduction with 4# AW 2 x 10 BLE; Sidelying reverse clam with 4# AW 2 x 10 BLE; TRX squats x 10, cues for proper form/technique;   PATIENT EDUCATION:  Education details: Pt educated throughout session about proper posture and technique with exercises. Improved exercise technique, movement at target joints, use of target muscles after min to mod verbal, visual, tactile cues. Person educated: Patient Education method: Explanation Education comprehension: verbalized understanding   HOME EXERCISE PROGRAM:  Access Code: [email or web address] URL: [email or web address] Date: 09/07/2022 Prepared by: Janet Berlin  Exercises - Gastroc Stretch on Wall  - 2 x daily - 7 x weekly - 1 sets - 2 reps - 60 hold - Seated Hamstring Stretch  - 2 x daily - 7 x weekly - 1 sets - 2 reps - 60 hold - Modified Thomas Stretch  - 2 x daily - 7 x weekly - 1 sets - 2- reps - 60 hold - Sit to Stand  - 1 x daily - 7 x weekly - 2 sets - 15 reps   GOALS: Goals reviewed with patient? Yes  SHORT TERM GOALS: Target  date: 09/08/2022   Pt will be independent with HEP to improve strength and decrease knee pain to improve pain-free function at home and work. Baseline:  Goal status: ACHIEVED   LONG TERM GOALS: Target date: 12/01/2022   Pt will increase FOTO to at least 58 to demonstrate significant improvement in function at home and work related to knee pain  Baseline: 08/11/22: 47; 09/29/22: 47; Goal status: ONGOING  2.  Pt will be able to bend, squat, and walk across an uneven yard without an increase in her L  knee pain in order to resume working in her yard/garden as well as return to walking for exercise with less pain Baseline: 09/29/22: Slight improvement Goal status: PARTIALLY MET  3.  Pt will increase LEFS score by at least 9 points in order demonstrate clinically significant reduction in knee pain/disability.       Baseline: 08/11/22: 43/80; 09/29/22: 43/80; Goal status: ONGOING  4.  Pt will increase strength of L hip abduction to within 10% of R side in order to demonstrate improvement in strength and function and improved stability at left knee . Baseline: 08/11/22: To be completed; 08/24/22: Hip abduction R/L: 26.2#/17.7#; 09/29/22: Hip abduction R/L: 25.5, 24, 27.0 (25.5#) /22.5, 20.0, 22.0 (21.5#); Goal status: PARTIALLY MET   ASSESSMENT:  CLINICAL IMPRESSION: Patient demonstrates excellent motivation during session today. Updated outcome measures with patient DURING 09/29/22 VISIT. Her LEFS and FOTO remain unchanged since the initial evaluation. However she reports slight improvement in her ability to bend, squat, and walk across an uneven yard with respect to her L knee pain. Her L hip abduction strength improved from 17.7# to 21.5#. At that time she was reporting continued L lateral ankle/foot pain however it continues improving since that time. Progressed strengthening during visit today. Pt encouraged to follow-up as scheduled and continue her HEP. Plan to progress strengthening during future  sessions. Pt will benefit from PT services to address deficits in strength, mobility, and pain in order to improve function at home and with leisure activities.    OBJECTIVE IMPAIRMENTS: Abnormal gait, decreased strength, and pain.   ACTIVITY LIMITATIONS: lifting, standing, squatting, and stairs  PARTICIPATION LIMITATIONS: cleaning, shopping, community activity, occupation, and yard work  PERSONAL FACTORS: Age, Time since onset of injury/illness/exacerbation, and 3+ comorbidities: DM, obesity, OSA, and afib  are also affecting patient's functional outcome.   REHAB POTENTIAL: Fair    CLINICAL DECISION MAKING: Unstable/unpredictable  EVALUATION COMPLEXITY: Moderate   PLAN: PT FREQUENCY: 1-2x/week  PT DURATION: 8 weeks  PLANNED INTERVENTIONS: Therapeutic exercises, Therapeutic activity, Neuromuscular re-education, Balance training, Gait training, Patient/Family education, Self Care, Joint mobilization, Joint manipulation, Vestibular training, Canalith repositioning, Orthotic/Fit training, DME instructions, Dry Needling, Electrical stimulation, Spinal manipulation, Spinal mobilization, Cryotherapy, Moist heat, Taping, Traction, Ultrasound, Ionotophoresis 4mg /ml Dexamethasone, Manual therapy, and Re-evaluation.  PLAN FOR NEXT SESSION: Progress strengthening, review/modify HEP as needed;  Sharalyn Ink Darran Gabay PT, DPT, GCS  9:11 AM,10/06/22

## 2022-10-05 LAB — CUP PACEART REMOTE DEVICE CHECK
Date Time Interrogation Session: 20240522230321
Implantable Pulse Generator Implant Date: 20230928

## 2022-10-06 ENCOUNTER — Ambulatory Visit: Payer: Medicare Other

## 2022-10-06 DIAGNOSIS — G8929 Other chronic pain: Secondary | ICD-10-CM | POA: Diagnosis not present

## 2022-10-06 DIAGNOSIS — M25562 Pain in left knee: Secondary | ICD-10-CM | POA: Diagnosis not present

## 2022-10-08 ENCOUNTER — Other Ambulatory Visit: Payer: Self-pay | Admitting: Family Medicine

## 2022-10-08 DIAGNOSIS — M5416 Radiculopathy, lumbar region: Secondary | ICD-10-CM

## 2022-10-10 ENCOUNTER — Ambulatory Visit (INDEPENDENT_AMBULATORY_CARE_PROVIDER_SITE_OTHER): Payer: Medicare Other

## 2022-10-10 ENCOUNTER — Ambulatory Visit: Payer: Medicare Other

## 2022-10-10 DIAGNOSIS — I428 Other cardiomyopathies: Secondary | ICD-10-CM | POA: Diagnosis not present

## 2022-10-10 NOTE — Telephone Encounter (Signed)
Please review.  KP

## 2022-10-10 NOTE — Telephone Encounter (Signed)
Requested medication (s) are due for refill today: yes  Requested medication (s) are on the active medication list: yes  Last refill:  09/03/22 #45 0 refills  Future visit scheduled: yes in 2 months  Notes to clinic:  no refills remain, last ordered by Rudene Christians, MD 09/03/22. Do you want to refill Rx?     Requested Prescriptions  Pending Prescriptions Disp Refills   gabapentin (NEURONTIN) 100 MG capsule [Pharmacy Med Name: Gabapentin 100 MG Oral Capsule] 45 capsule 0    Sig: TAKE 1 TO 3 CAPSULES BY MOUTH AT BEDTIME     Neurology: Anticonvulsants - gabapentin Passed - 10/08/2022  8:29 PM      Passed - Cr in normal range and within 360 days    Creatinine  Date Value Ref Range Status  07/31/2011 1.04 0.60 - 1.30 mg/dL Final   Creatinine, Ser  Date Value Ref Range Status  03/20/2022 0.79 0.57 - 1.00 mg/dL Final         Passed - Completed PHQ-2 or PHQ-9 in the last 360 days      Passed - Valid encounter within last 12 months    Recent Outpatient Visits           1 month ago Type II diabetes mellitus with complication [facility])   [place] [facility], Nyoka Cowden, MD   1 month ago Left lumbar radiculopathy   [place] [facility], Ocie Bob, MD   2 months ago Left lumbar radiculopathy   [place] [facility], Ocie Bob, MD   5 months ago Primary osteoarthritis of right knee   [facility] at [facility], Ocie Bob, MD   6 months ago Primary osteoarthritis of right knee   [facility] at [facility], Ocie Bob, MD       Future Appointments             In 2 months Judithann Graves, Nyoka Cowden, MD [facility] at [facility], [facility]

## 2022-10-11 NOTE — Progress Notes (Signed)
Care[facility] Summary Report / Loop Recorder 

## 2022-10-12 ENCOUNTER — Ambulatory Visit: Payer: Self-pay

## 2022-10-12 ENCOUNTER — Other Ambulatory Visit: Payer: Self-pay | Admitting: Family Medicine

## 2022-10-12 DIAGNOSIS — M5416 Radiculopathy, lumbar region: Secondary | ICD-10-CM

## 2022-10-12 NOTE — Telephone Encounter (Signed)
Requested Prescriptions  Pending Prescriptions Disp Refills   diclofenac (VOLTAREN) 50 MG EC tablet [Pharmacy Med Name: Diclofenac Sodium 50 MG Oral Tablet Delayed Release] 180 tablet 0    Sig: Take 1 tablet by mouth twice daily as needed     Analgesics:  NSAIDS Failed - 10/12/2022  7:51 AM      Failed - Manual Review: Labs are only required if the patient has taken medication for more than 8 weeks.      Failed - HGB in normal range and within 360 days    Hemoglobin  Date Value Ref Range Status  11/16/2020 14.6 12.0 - 15.0 g/dL Final  09/81/1914 78.2 (H) 11.1 - 15.9 g/dL Final         Failed - PLT in normal range and within 360 days    Platelets  Date Value Ref Range Status  11/16/2020 267 150 - 400 K/uL Final  09/21/2020 310 150 - 450 x10E3/uL Final         Failed - HCT in normal range and within 360 days    HCT  Date Value Ref Range Status  11/16/2020 42.7 36.0 - 46.0 % Final   Hematocrit  Date Value Ref Range Status  09/21/2020 47.8 (H) 34.0 - 46.6 % Final         Passed - Cr in normal range and within 360 days    Creatinine  Date Value Ref Range Status  07/31/2011 1.04 0.60 - 1.30 mg/dL Final   Creatinine, Ser  Date Value Ref Range Status  03/20/2022 0.79 0.57 - 1.00 mg/dL Final         Passed - eGFR is 30 or above and within 360 days    EGFR (African American)  Date Value Ref Range Status  07/31/2011 >60 >64mL/min Final   GFR calc Af Amer  Date Value Ref Range Status  01/24/2020 >60 >60 mL/min Final   EGFR (Non-African Amer.)  Date Value Ref Range Status  07/31/2011 58 (L) >48mL/min Final    Comment:    eGFR values <87mL/min/1.73 m2 may be an indication of chronic kidney disease (CKD). Calculated eGFR, using the MRDR Study equation, is useful in  patients with stable renal function. The eGFR calculation will not be reliable in acutely ill patients when serum creatinine is changing rapidly. It is not useful in patients on dialysis. The eGFR calculation  may not be applicable to patients at the low and high extremes of body sizes, pregnant women, and vegetarians.    GFR, Estimated  Date Value Ref Range Status  11/16/2020 >60 >60 mL/min Final    Comment:    (NOTE) Calculated using the CKD-EPI Creatinine Equation (2021)    eGFR  Date Value Ref Range Status  03/20/2022 82 >59 mL/min/1.73 Final         Passed - Patient is not pregnant      Passed - Valid encounter within last 12 months    Recent Outpatient Visits           1 month ago Type II diabetes mellitus with complication (HCC)   [place] [facility] at [facility], Nyoka Cowden, MD   1 month ago Left lumbar radiculopathy   [place] [facility] at [facility], Ocie Bob, MD   2 months ago Left lumbar radiculopathy   [facility] at [facility], Ocie Bob, MD   5 months ago Primary osteoarthritis  of right knee   [place] [facility] at [facility], Ocie Bob, MD   6 months ago Primary osteoarthritis of right knee   [facility] at [facility], Ocie Bob, MD       Future Appointments             In 2 months Judithann Graves, Nyoka Cowden, MD [facility] at [facility], [facility]

## 2022-10-12 NOTE — Telephone Encounter (Signed)
    Chief Complaint: Left knee, ankle,foot pain. Requests appointment tomorrow. Symptoms: Above Frequency: 2 weeks ago Pertinent Negatives: Patient denies swelling Disposition: [] ED /[] Urgent Care (no appt availability in office) / [x] Appointment(In office/virtual)/ []  [place] [facility]/ [] Home Care/ [] Refused Recommended Disposition /[] [facility]/ []  Follow-up with PCP Additional Notes:   Reason for Disposition  [1] SEVERE pain (e.g., excruciating, unable to walk) AND [2] not improved after 2 hours of pain medicine  Answer Assessment - Initial Assessment Questions 1. LOCATION and RADIATION: "Where is the pain located?"      Left knee, ankle and foot 2. QUALITY: "What does the pain feel like?"  (e.g., sharp, dull, aching, burning)     Aches 3. SEVERITY: "How bad is the pain?" "What does it keep you from doing?"   (Scale 1-10; or mild, moderate, severe)   -  MILD (1-3): doesn't interfere with normal activities    -  MODERATE (4-7): interferes with normal activities (e.g., work or school) or awakens from sleep, limping    -  SEVERE (8-10): excruciating pain, unable to do any normal activities, unable to walk     9 4. ONSET: "When did the pain start?" "Does it come and go, or is it there all the time?"     2 weeks ago 5. RECURRENT: "Have you had this pain before?" If Yes, ask: "When, and what happened then?"     No 6. SETTING: "Has there been any recent work, exercise or other activity that involved that part of the body?"      No 7. AGGRAVATING FACTORS: "What makes the knee pain worse?" (e.g., walking, climbing stairs, running)     Walking 8. ASSOCIATED SYMPTOMS: "Is there any swelling or redness of the knee?"     No 9. OTHER SYMPTOMS: "Do you have any other symptoms?" (e.g., chest pain, difficulty breathing, fever, calf pain)     No 10. PREGNANCY: "Is there any chance you are pregnant?" "When was your last menstrual period?"       No  Protocols used: Knee  Pain-A-AH

## 2022-10-13 ENCOUNTER — Ambulatory Visit (INDEPENDENT_AMBULATORY_CARE_PROVIDER_SITE_OTHER): Payer: Medicare Other | Admitting: Family Medicine

## 2022-10-13 ENCOUNTER — Encounter: Payer: Self-pay | Admitting: Family Medicine

## 2022-10-13 ENCOUNTER — Ambulatory Visit: Payer: Medicare Other

## 2022-10-13 VITALS — BP 128/70 | HR 76 | Ht 67.0 in | Wt 232.0 lb

## 2022-10-13 DIAGNOSIS — G8929 Other chronic pain: Secondary | ICD-10-CM | POA: Diagnosis not present

## 2022-10-13 DIAGNOSIS — M7672 Peroneal tendinitis, left leg: Secondary | ICD-10-CM

## 2022-10-13 DIAGNOSIS — M25562 Pain in left knee: Secondary | ICD-10-CM | POA: Diagnosis not present

## 2022-10-13 NOTE — Patient Instructions (Signed)
-   Restart diclofenac scheduled x 2 weeks, then dose as needed - Continue with physical therapy, new referral to include peroneal tendons sent today - Can use lace up ASO brace - Contact us at 1-2 weeks if symptoms fail to improve to discuss next steps

## 2022-10-13 NOTE — Progress Notes (Signed)
     Primary Care / Sports Medicine Office Visit  Patient Information:  Patient ID: Brandy [name], female DOB: 1955/10/05 Age: 67 y.o. MRN: [id number]   Brandy [name] is a pleasant 67 y.o. female presenting with the following:  Chief Complaint  Patient presents with   Foot Pain    Had bone spur, podiatry removed it. Bottom foot up shin, 1.5 week    Vitals:   10/13/22 1051  BP: 128/70  Pulse: 76  SpO2: 96%   Vitals:   10/13/22 1051  Weight: 232 lb (105.2 kg)  Height: 5\' 7"  (1.702 m)   Body mass index is 36.34 kg/m.  CUP PACEART REMOTE DEVICE CHECK  Result Date: 10/05/2022 ILR summary report received. Battery status OK. Normal device function. No new symptom, tachy, brady, or pause episodes. No new AF episodes. Monthly summary reports and ROV/PRN. MC, CVRS.    Independent interpretation of notes and tests performed by another provider:   None  Procedures performed:   None  Pertinent History, Exam, Impression, and Recommendations:   Brandy [name] was seen today for foot pain.  Peroneal tendinitis, left Assessment & Plan: Patient with atraumatic onset over the past 1-2 weeks of left lateral foot/ankle pain in the setting of relative overuse associated with physical therapy and working in her garden.  Denies any paresthesias beyond baseline.  Exam localizes to peroneal tendons where there is adequate function though with pain.  Examination otherwise nonfocal.  Plan as follows: - Restart diclofenac scheduled x 2 weeks, then dose as needed - Continue with physical therapy, new referral to include peroneal tendons sent today - Can use lace up ASO brace - Contact us at 1-2 weeks if symptoms fail to improve to discuss next steps - Can consider prednisone course, cam boot  Orders: -     Ambulatory referral to Physical Therapy   I provided a total time of 30 minutes including both face-to-face and non-face-to-face time on 10/13/2022 inclusive of time utilized for medical chart review,  information gathering, care coordination with staff, and documentation completion.   Orders & Medications No orders of the defined types were placed in this encounter.  Orders Placed This Encounter  Procedures   Ambulatory referral to Physical Therapy     No follow-ups on file.     [name], MD, [facility]   Primary Care Sports Medicine Primary Care and Sports Medicine at [facility]

## 2022-10-13 NOTE — Assessment & Plan Note (Signed)
Patient with atraumatic onset over the past 1-2 weeks of left lateral foot/ankle pain in the setting of relative overuse associated with physical therapy and working in her garden.  Denies any paresthesias beyond baseline.  Exam localizes to peroneal tendons where there is adequate function though with pain.  Examination otherwise nonfocal.  Plan as follows: - Restart diclofenac scheduled x 2 weeks, then dose as needed - Continue with physical therapy, new referral to include peroneal tendons sent today - Can use lace up ASO brace - Contact us at 1-2 weeks if symptoms fail to improve to discuss next steps - Can consider prednisone course, cam boot

## 2022-10-13 NOTE — Therapy (Signed)
OUTPATIENT PHYSICAL THERAPY KNEE TREATMENT  Patient Name: Brandy [name] MRN: [id number] DOB:01-28-1956, 67 y.o., female Today's Date: 10/13/2022  END OF SESSION:  PT End of Session - 10/13/22 0804     Visit Number 13    Number of Visits 33    Date for PT Re-Evaluation 12/01/22    Authorization Type [employer] [facility]    Authorization Time Period eval: 08/11/22    PT Start Time 0800    PT Stop Time 0845    PT Time Calculation (min) 45 min    Activity Tolerance Patient tolerated treatment well    Behavior During Therapy [facility] for tasks assessed/performed            Past Medical History:  Diagnosis Date   Acute lower GI bleeding 01/19/2020   Arthritis    Atrial fibrillation with rapid ventricular response (HCC)    Chicken pox    Chronic systolic CHF (congestive heart failure) (HCC)    a. 03/2016 Echo: Ef 15-20%, diff HK, ant AK;  b. 05/2016 Echo: Ef 30-35%, diff HK, mildly dil LA/RA.   Diabetes mellitus without complication (HCC)    Dysrhythmia    GERD (gastroesophageal reflux disease)    Heart murmur    Hip discomfort    been going on for 40 years    History of kidney stones    Hyperlipidemia associated with type 2 diabetes mellitus (HCC) 05/12/2021   Hypertension    Moderate mitral regurgitation    a. 03/2016 Echo: mod MR in setting of LV dysfxn.   Motion sickness    car - back seat   NICM (nonischemic cardiomyopathy) (HCC)    a. 03/2016 Echo: EF 15-20%, diff HK, ant AK, mod MR, mod dil LA, mildly dil RA;  b. 04/2016 Cath: nl cors;  c. 05/2016 Echo: EF 30-35%, diff HK.   Obesity    Obstructive sleep apnea    compliant with CPAP   Persistent atrial fibrillation (HCC)    a. Dx 03/2016;  b. CHA2DS2VASc = 2-->Eliquis 5mg  BID;  b. 05/2016 Failed DCCV x 4.   Sleep apnea    Stomach irritation    Visit for monitoring Tikosyn therapy 07/24/2016   Past Surgical History:  Procedure Laterality Date   ATRIAL FIBRILLATION ABLATION N/A 12/09/2020   Procedure: ATRIAL  FIBRILLATION ABLATION;  Surgeon: Lanier Prude, MD;  Location: [facility];  Service: Cardiovascular;  Laterality: N/A;   BIOPSY N/A 01/12/2020   Procedure: BIOPSY;  Surgeon: Pasty Spillers, MD;  Location: [facility];  Service: Endoscopy;  Laterality: N/A;   CARDIAC CATHETERIZATION N/A 04/17/2016   Procedure: Left Heart Cath and Coronary Angiography;  Surgeon: Iran Ouch, MD;  Location: [facility] INVASIVE CV LAB;  Service: Cardiovascular;  Laterality: N/A;   CARDIOVERSION N/A 10/22/2020   Procedure: CARDIOVERSION;  Surgeon: Yvonne Kendall, MD;  Location: [facility] ORS;  Service: Cardiovascular;  Laterality: N/A;   CHOLECYSTECTOMY     COLON SURGERY  2021   COLONOSCOPY N/A 01/20/2020   Procedure: COLONOSCOPY;  Surgeon: Regis Bill, MD;  Location: [facility] ENDOSCOPY;  Service: Endoscopy;  Laterality: N/A;   COLONOSCOPY WITH PROPOFOL N/A 01/12/2020   Procedure: COLONOSCOPY WITH PROPOFOL;  Surgeon: Pasty Spillers, MD;  Location: [facility];  Service: Endoscopy;  Laterality: N/A;  Diabetic - oral meds sleep apnea   ELECTROPHYSIOLOGIC STUDY N/A 05/26/2016   Procedure: Cardioversion;  Surgeon: Iran Ouch, MD;  Location: [facility] ORS;  Service: Cardiovascular;  Laterality: N/A;  ESOPHAGOGASTRODUODENOSCOPY (EGD) WITH PROPOFOL N/A 01/12/2020   Procedure: ESOPHAGOGASTRODUODENOSCOPY (EGD) WITH PROPOFOL;  Surgeon: Pasty Spillers, MD;  Location: [facility];  Service: Endoscopy;  Laterality: N/A;   POLYPECTOMY N/A 01/12/2020   Procedure: POLYPECTOMY;  Surgeon: Pasty Spillers, MD;  Location: [facility];  Service: Endoscopy;  Laterality: N/A;   Patient Active Problem List   Diagnosis Date Noted   Peroneal tendinitis, left 10/13/2022   Left lumbar radiculopathy 07/26/2022   Arthralgia of left knee 07/26/2022   Pes anserinus bursitis of right knee 04/19/2022   Primary osteoarthritis of right knee 03/27/2022   Greater trochanteric pain  syndrome of right lower extremity 03/27/2022   It band syndrome, right 03/27/2022   Hyperlipidemia associated with type 2 diabetes mellitus (HCC) 05/12/2021   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   Varicose veins of leg with pain, bilateral 07/06/2020   Acquired thrombophilia (HCC) 05/24/2020   Type II diabetes mellitus with complication (HCC) 01/19/2020   Gastric polyp    Colon polyps    Varicose veins of both lower extremities 10/20/2019   Dysphagia 10/20/2019   Osteoarthritis of right hand 10/31/2016   Nonischemic cardiomyopathy (HCC) 04/14/2016   BMI 33.0-33.9,adult 04/14/2016   PCP: Dr. Bari Edward  REFERRING PROVIDER: Dr. Joseph Berkshire  REFERRING DIAG: Arthralgia of left knee  RATIONALE FOR EVALUATION AND TREATMENT: Rehabilitation  THERAPY DIAG: Chronic pain of left knee  ONSET DATE: Ongoing for years, pt unsure of start date  FOLLOW-UP APPT SCHEDULED WITH REFERRING PROVIDER: Yes   FROM INITIAL EVALUATION SUBJECTIVE:                                                                                                                                                                                         SUBJECTIVE STATEMENT:  L knee pain  PERTINENT HISTORY:  Pt reports chronic L knee pain for multiple years. No known trauma and no prior history of surgery. Pain is mostly located in the inside portion of her knee. She saw [name] who ordered plain films which showed severe medial and mild-to-moderate patellofemoral compartment osteoarthritis. At the time of her evaluation it was felt that her clinical features were most consistent with medial tibiofemoral and patellofemaral arthralgia and possible chronic MCL sprains. She was prescribed a regimen of diclofenac, educated in activity modification, and advised to return in 4 weeks to consider intra-articular steroid injection if symptoms have not improved. It was also recommended that pt work with physical  therapy. At this time pt would like to defer a steroid injection due to concerns of her blood sugar regulation. Pt was previously seen by physical therapy for R  hip and R knee pain which responded well to therapy. She also has a history of chronic low back pain which started initially 33 years ago when she was pregnant. She was prescribed Gabapentin and also advised to start physical therapy as symptoms will allow.  History from 05/11/22 Pt reports history of chronic R hip pain for decades with recent onset of R knee pain in August 2023. No known trauma but pt thinks she might have aggravated her knee walking across uneven grass. She saw [name] and eventually had steroid injections in her R greater trochanter, R knee injection, and R pes bursa on 04/18/22. Radiographs (03/14/22) of R knee showed mild degenerative changes at the patellofemoral joint, worse laterally. She receives chiropractic care every 2 weeks for her low back/hip pain and also sees massage therapy once/month.   L knee radiographs: 07/26/22: IMPRESSION: Severe medial and mild-to-moderate patellofemoral compartment osteoarthritis.  PAIN:    Pain Intensity: Present: 3/10, Best: 0/10, Worst: 8/10 Pain location: Medial L knee pain radiating up inner thigh and down to the L foot; Pain Quality: sharp Radiating: Yes, up the medial L thigh and down to the L foot, occasional L toe throbbing  Numbness/Tingling: Yes, lateral L knee; Focal Weakness: Yes, L knee feels unstable and buckles, no hyperextension Aggravating factors: Walking too fast, extended standing, standing after extended sitting, steps; Relieving factors: resting, ice, elevation, heat, antiinflammatories, knee brace; 24-hour pain behavior: worsens with activity as day progresses History of prior back, hip, knee injury, pain, surgery, or therapy: Yes, history of chronic back, R hip/knee pain, and L hip pain. No prior history of orthopedic surgery to back, hip, or  knee. Dominant hand: right Imaging: Yes, see history Red flags: Negative for bowel/bladder changes, saddle paresthesia, personal history of cancer, h/o spinal tumors, h/o compression fx, h/o abdominal aneurysm, abdominal pain, chills/fever, night sweats, nausea, vomiting, and unrelenting pain;  PRECAUTIONS: None  WEIGHT BEARING RESTRICTIONS: No  FALLS: Has patient fallen in last 6 months? 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   Hobbies: Pottery (currently not limited but initially had trouble sitting at the wheel), gardening/yard work;  Patient Goals: walking across the yard with improved confidence, lifting heavier objects at home without pain in hip, working the yard, "I would like to be able to get back to walking."  OBJECTIVE:  Patient Surveys  LEFS To be completed FOTO 47, predicted improvement to 34  Cognition Patient is oriented to person, place, and time.  Recent memory is intact.  Remote memory is intact.  Attention span and concentration are intact.  Expressive speech is intact.  Patient's fund of knowledge is within normal limits for educational level.    Gross Musculoskeletal Assessment Tremor: None Bulk: Normal Tone: Normal  GAIT: Full gait assessment deferred however pt does demonstrate medial whip of R heel and bilateral ankle pronation during gait. No excessive frontal plane motion of pelvis noted. Gait does appears slightly antalgic  Posture:  Forward head/rounded shoulders in sitting, full posture assessment deferred;  AROM AROM (Normal range in degrees) AROM   Lumbar   Flexion (65) Moderate loss of segmental flexion  Extension (30) Mild loss  Right  lateral flexion (25) Mild loss  Left lateral flexion (25) Mild loss  Right rotation (30) Mild loss  Left rotation (30) Mild loss   AROM (Normal range in degrees) AROM    Hip Right Left  Flexion (125) WNL WNL  Extension (15)    Abduction (40) 25 25  Adduction     Internal Rotation (45)    External Rotation (45)        Knee    Flexion (135) 125*, soft end feel 125*, soft end feel  Extension (0) 0 0      Ankle    Dorsiflexion (20)    (* = pain; Blank rows = not tested)  LE MMT: MMT (out of 5) Right  Left   Hip flexion 5 4+  Hip extension    Hip abduction    Hip adduction    Hip internal rotation 5 4+  Hip external rotation 5 4+  Knee flexion    Knee extension 5 5  Ankle dorsiflexion 5 4+  Ankle plantarflexion Stron5 Strong  (* = pain; Blank rows = not tested)  Muscle Length Hamstrings: R: Positive for tightness at 80 degrees L: Positive for tightness at 80 degrees  Quadriceps Michela Pitcher): R: Not examined L: Not examined Hip flexors Maisie Fus): R: Not examined L: Not examined IT band Claiborne Rigg): R: Not examined L: Not examined  Palpation Location LEFT  RIGHT           Quadriceps 0   Medial Hamstrings 0   Lateral Hamstrings 0   Lateral Hamstring tendon 1   Medial Hamstring tendon 1   Quadriceps tendon 0   Patella 0   Patellar Tendon 0   Tibial Tuberosity 1   Medial joint line 1   Lateral joint line 1   MCL 1   LCL 1   Adductor Tubercle 1   Pes Anserine tendon 1   Infrapatellar fat pad 0   Fibular head 1   Popliteal fossa 0   (Blank rows = not tested) Graded on 0-4 scale (0 = no pain, 1 = pain, 2 = pain with wincing/grimacing/flinching, 3 = pain with withdrawal, 4 = unwilling to allow palpation), (Blank rows = not tested)  SPECIAL TESTS  Ligamentous Stability  ACL: Lachman's: R: Not examined L: Negative Active Lachman's: R: Not examined L: Negative Anterior Drawer: R: Not examined L: Negative Pivot Shift: R: Not examined L: Negative  PCL: Posterior Drawer:  R: Not examined L: Negative Reverse Lachman's: R: Not examined L: Negative Posterior Sag Sign: R: Not examined L: Negative  MCL: Valgus Stress (30 degrees flexion): R: Not examined L: Negative  LCL: Varus Stress (30 degrees flexion): R: Not examined L: Negative  Meniscus Tests McMurray's Test:  Medial Meniscus (Tibial ER): R: Not examined L: Negative Lateral Meniscus (Tibial IR): R: Not examined L: Negative Thessaly: R: Not examined L: Not examined Ege's: R: Not examined L: Not examined Effusion: R: Not examined L: Negative   Patellofemoral Pain Syndrome Patellar Tilt (Lateral): R: Not examined L: Negative Patellar Apprehension: R: Not examined L: Not examined Squatting pain: R: Not examined L: Positive Stair climbing pain: R: Not examined L: Positive Kneeling pain: R: Not examined L: Positive Resisted knee extension pain: R: Not examined L: Negative Compression: R: Not examined L: Positive  Patellar Tendinopathy Inferior pole palpation with anterior tilt: R: Not examined L: Negative  Passive Accessory Intervertebral Motion Deferred  Hip: FABER (SN 81): R: Positive L: Negative FADIR (SN 94): R: Positive L: Negative Hip scour (SN 50): R: Negative  L: Negative Figure 4: R: Negative L: Positive for lateral L hip pain  SIJ:  Thigh Thrust (SN 88, -LR 0.18) : R: Not examined L: Not examined  Piriformis Syndrome: FAIR Test (SN 88, SP 83): R: Not examined L: Not examined  Functional Tasks Deferred   Beighton Scale: Deferred, denies history of hypermobility   TODAY'S TREATMENT    SUBJECTIVE: Pt reports that she is having a lot of L lateral foot/ankle pain today. It has significantly limited her ambulation and is causing issues at her L knee, hip, and back. She has an appointment to see [name] later this morning. She is planning to refill her Diclofenac and restart.   PAIN: 5/10 L lateral ankle pain;   Ther-ex NuStep L3-5 (seated 9, arms 10) x 8 minutes for BLE  strengthening and warm-up and LE strengthening during interval history; Isometric L ankle eversion 30s hold/30s relax x 5; Isometric L ankle dorsiflexion 30s hold/30s relax x 5;   Manual Therapy  L ankle inversion and plantarflexion stretches 3 x 30s each; Extensive STM to L peroneus longus/brevis as well as L gastroc; Ice massage for STM and analgesia to L peroneus longus/brevis;   Not performed:  TRX lateral lunges x 10 toward each side; Seated LAQ with 5# AW x 15 BLE; Standing exercises with 5# ankle weights (AW): Hip flexion marches x 15 BLE; Hip abduction x 15 BLE; Hamstring curls x 15 BLE; Hip extension x 15 BLE; Supine SLR with 4# ankle weight (AW) 2 x 10 BLE; Hooklying clams with manual resistance 2 x 10; Hooklying adductor squeeze with manual resistance 2 x 10; Supine L SAQ over bolster with heavy manual resistance 2 x 10; Sidelying hip abduction with 4# AW 2 x 10 BLE; Sidelying reverse clam with 4# AW 2 x 10 BLE; TRX squats x 10, cues for proper form/technique; Total Gym (TG) Level 22 (L22) double leg squats with 2, 9# dumbbells (DB) 2 x 15; TG L22 double leg heel raises with 2, 9# DB 2 x 15; TG L22 single leg partial squat LLE x 10, x 15, RLE x 10, x 15; TRX split squats with 1 Airex pad on a 6" step under rear knee 2 x 10 on each side; Resisted side stepping with blue tband around ankles x 6 lengths;    PATIENT EDUCATION:  Education details: Pt educated throughout session about proper posture and technique with exercises. Improved exercise technique, movement at target joints, use of target muscles after min to mod verbal, visual, tactile cues. Person educated: Patient Education method: Explanation Education comprehension: verbalized understanding   HOME EXERCISE PROGRAM:  Access Code: [email or web address] URL: [email or web address] Date: 09/07/2022 Prepared by: Janet Berlin  Exercises - Gastroc Stretch on Wall  - 2 x daily - 7 x weekly - 1 sets - 2 reps -  60 hold - Seated Hamstring Stretch  - 2 x daily - 7 x weekly - 1 sets - 2 reps - 60 hold - Modified Thomas Stretch  - 2 x daily - 7 x weekly - 1 sets - 2- reps - 60 hold - Sit to Stand  - 1 x daily - 7 x weekly - 2 sets - 15 reps   GOALS: Goals reviewed with patient? Yes  SHORT TERM GOALS: Target date: 09/08/2022   Pt will be independent with HEP to improve strength and decrease knee pain to improve pain-free function at home and work. Baseline:  Goal status: ACHIEVED   LONG TERM GOALS: Target date:  12/01/2022   Pt will increase FOTO to at least 58 to demonstrate significant improvement in function at home and work related to knee pain  Baseline: 08/11/22: 47; 09/29/22: 47; Goal status: ONGOING  2.  Pt will be able to bend, squat, and walk across an uneven yard without an increase in her L knee pain in order to resume working in her yard/garden as well as return to walking for exercise with less pain Baseline: 09/29/22: Slight improvement Goal status: PARTIALLY MET  3.  Pt will increase LEFS score by at least 9 points in order demonstrate clinically significant reduction in knee pain/disability.       Baseline: 08/11/22: 43/80; 09/29/22: 43/80; Goal status: ONGOING  4.  Pt will increase strength of L hip abduction to within 10% of R side in order to demonstrate improvement in strength and function and improved stability at left knee . Baseline: 08/11/22: To be completed; 08/24/22: Hip abduction R/L: 26.2#/17.7#; 09/29/22: Hip abduction R/L: 25.5, 24, 27.0 (25.5#) /22.5, 20.0, 22.0 (21.5#); Goal status: PARTIALLY MET   ASSESSMENT:  CLINICAL IMPRESSION: Patient is reporting continued L lateral ankle/foot pain and has an appointment with [name] later this morning. Session focused on ankle pain as it is preventing her ability to strengthen L hip and knee. Asked pt to have [name] send a new order if he wants [place] to focus on L ankle pain at this time. Pt encouraged to follow-up as  scheduled and continue her HEP. Plan to progress strengthening during future sessions. Pt will benefit from PT services to address deficits in strength, mobility, and pain in order to improve function at home and with leisure activities.    OBJECTIVE IMPAIRMENTS: Abnormal gait, decreased strength, and pain.   ACTIVITY LIMITATIONS: lifting, standing, squatting, and stairs  PARTICIPATION LIMITATIONS: cleaning, shopping, community activity, occupation, and yard work  PERSONAL FACTORS: Age, Time since onset of injury/illness/exacerbation, and 3+ comorbidities: DM, obesity, OSA, and afib  are also affecting patient's functional outcome.   REHAB POTENTIAL: Fair    CLINICAL DECISION MAKING: Unstable/unpredictable  EVALUATION COMPLEXITY: Moderate   PLAN: PT FREQUENCY: 1-2x/week  PT DURATION: 8 weeks  PLANNED INTERVENTIONS: Therapeutic exercises, Therapeutic activity, Neuromuscular re-education, Balance training, Gait training, Patient/Family education, Self Care, Joint mobilization, Joint manipulation, Vestibular training, Canalith repositioning, Orthotic/Fit training, DME instructions, Dry Needling, Electrical stimulation, Spinal manipulation, Spinal mobilization, Cryotherapy, Moist heat, Taping, Traction, Ultrasound, Ionotophoresis 4mg /ml Dexamethasone, Manual therapy, and Re-evaluation.  PLAN FOR NEXT SESSION: Progress strengthening, review/modify HEP as needed;  Sharalyn Ink Zandra Lajeunesse PT, DPT, GCS  10:04 PM,10/13/22

## 2022-10-16 ENCOUNTER — Other Ambulatory Visit: Payer: Self-pay | Admitting: Family Medicine

## 2022-10-17 DIAGNOSIS — K589 Irritable bowel syndrome without diarrhea: Secondary | ICD-10-CM | POA: Diagnosis not present

## 2022-10-17 DIAGNOSIS — D6869 Other thrombophilia: Secondary | ICD-10-CM | POA: Diagnosis not present

## 2022-10-19 NOTE — Therapy (Signed)
OUTPATIENT PHYSICAL THERAPY KNEE TREATMENT/DISCHARGE  Patient Name: Brandy [name] MRN: [id number] DOB:1956/02/07, 67 y.o., female Today's Date: 10/20/2022  END OF SESSION:  PT End of Session - 10/20/22 0933     Visit Number 14    Number of Visits 33    Date for PT Re-Evaluation 12/01/22    Authorization Type [employer] [facility]    Authorization Time Period eval: 08/11/22    PT Start Time 0933    PT Stop Time 1015    PT Time Calculation (min) 42 min    Activity Tolerance Patient tolerated treatment well    Behavior During Therapy [facility] for tasks assessed/performed            Past Medical History:  Diagnosis Date   Acute lower GI bleeding 01/19/2020   Arthritis    Atrial fibrillation with rapid ventricular response (HCC)    Chicken pox    Chronic systolic CHF (congestive heart failure) (HCC)    a. 03/2016 Echo: Ef 15-20%, diff HK, ant AK;  b. 05/2016 Echo: Ef 30-35%, diff HK, mildly dil LA/RA.   Diabetes mellitus without complication (HCC)    Dysrhythmia    GERD (gastroesophageal reflux disease)    Heart murmur    Hip discomfort    been going on for 40 years    History of kidney stones    Hyperlipidemia associated with type 2 diabetes mellitus (HCC) 05/12/2021   Hypertension    Moderate mitral regurgitation    a. 03/2016 Echo: mod MR in setting of LV dysfxn.   Motion sickness    car - back seat   NICM (nonischemic cardiomyopathy) (HCC)    a. 03/2016 Echo: EF 15-20%, diff HK, ant AK, mod MR, mod dil LA, mildly dil RA;  b. 04/2016 Cath: nl cors;  c. 05/2016 Echo: EF 30-35%, diff HK.   Obesity    Obstructive sleep apnea    compliant with CPAP   Persistent atrial fibrillation (HCC)    a. Dx 03/2016;  b. CHA2DS2VASc = 2-->Eliquis 5mg  BID;  b. 05/2016 Failed DCCV x 4.   Sleep apnea    Stomach irritation    Visit for monitoring Tikosyn therapy 07/24/2016   Past Surgical History:  Procedure Laterality Date   ATRIAL FIBRILLATION ABLATION N/A 12/09/2020   Procedure: ATRIAL  FIBRILLATION ABLATION;  Surgeon: Lanier Prude, MD;  Location: [facility] INVASIVE CV LAB;  Service: Cardiovascular;  Laterality: N/A;   BIOPSY N/A 01/12/2020   Procedure: BIOPSY;  Surgeon: Pasty Spillers, MD;  Location: [facility] SURGERY [facility];  Service: Endoscopy;  Laterality: N/A;   CARDIAC CATHETERIZATION N/A 04/17/2016   Procedure: Left Heart Cath and Coronary Angiography;  Surgeon: Iran Ouch, MD;  Location: [facility] INVASIVE CV LAB;  Service: Cardiovascular;  Laterality: N/A;   CARDIOVERSION N/A 10/22/2020   Procedure: CARDIOVERSION;  Surgeon: Yvonne Kendall, MD;  Location: [facility] ORS;  Service: Cardiovascular;  Laterality: N/A;   CHOLECYSTECTOMY     COLON SURGERY  2021   COLONOSCOPY N/A 01/20/2020   Procedure: COLONOSCOPY;  Surgeon: Regis Bill, MD;  Location: [facility] ENDOSCOPY;  Service: Endoscopy;  Laterality: N/A;   COLONOSCOPY WITH PROPOFOL N/A 01/12/2020   Procedure: COLONOSCOPY WITH PROPOFOL;  Surgeon: Pasty Spillers, MD;  Location: [facility];  Service: Endoscopy;  Laterality: N/A;  Diabetic - oral meds sleep apnea   ELECTROPHYSIOLOGIC STUDY N/A 05/26/2016   Procedure: Cardioversion;  Surgeon: Iran Ouch, MD;  Location: [facility] ORS;  Service: Cardiovascular;  Laterality: N/A;  ESOPHAGOGASTRODUODENOSCOPY (EGD) WITH PROPOFOL N/A 01/12/2020   Procedure: ESOPHAGOGASTRODUODENOSCOPY (EGD) WITH PROPOFOL;  Surgeon: Pasty Spillers, MD;  Location: [facility];  Service: Endoscopy;  Laterality: N/A;   POLYPECTOMY N/A 01/12/2020   Procedure: POLYPECTOMY;  Surgeon: Pasty Spillers, MD;  Location: [facility]ERY [facility];  Service: Endoscopy;  Laterality: N/A;   Patient Active Problem List   Diagnosis Date Noted   Peroneal tendinitis, left 10/13/2022   Left lumbar radiculopathy 07/26/2022   Arthralgia of left knee 07/26/2022   Pes anserinus bursitis of right knee 04/19/2022   Primary osteoarthritis of right knee 03/27/2022   Greater trochanteric pain  syndrome of right lower extremity 03/27/2022   It band syndrome, right 03/27/2022   Hyperlipidemia associated with type 2 diabetes mellitus (HCC) 05/12/2021   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   Varicose veins of leg with pain, bilateral 07/06/2020   Acquired thrombophilia (HCC) 05/24/2020   Type II diabetes mellitus with complication (HCC) 01/19/2020   Gastric polyp    Colon polyps    Varicose veins of both lower extremities 10/20/2019   Dysphagia 10/20/2019   Osteoarthritis of right hand 10/31/2016   Nonischemic cardiomyopathy (HCC) 04/14/2016   BMI 33.0-33.9,adult 04/14/2016   PCP: Dr. Bari Edward  REFERRING PROVIDER: Dr. Joseph Berkshire  REFERRING DIAG: Arthralgia of left knee  RATIONALE FOR EVALUATION AND TREATMENT: Rehabilitation  THERAPY DIAG: Chronic pain of left knee  ONSET DATE: Ongoing for years, pt unsure of start date  FOLLOW-UP APPT SCHEDULED WITH REFERRING PROVIDER: Yes   FROM INITIAL EVALUATION SUBJECTIVE:                                                                                                                                                                                         SUBJECTIVE STATEMENT:  L knee pain  PERTINENT HISTORY:  Pt reports chronic L knee pain for multiple years. No known trauma and no prior history of surgery. Pain is mostly located in the inside portion of her knee. She saw [name] who ordered plain films which showed severe medial and mild-to-moderate patellofemoral compartment osteoarthritis. At the time of her evaluation it was felt that her clinical features were most consistent with medial tibiofemoral and patellofemaral arthralgia and possible chronic MCL sprains. She was prescribed a regimen of diclofenac, educated in activity modification, and advised to return in 4 weeks to consider intra-articular steroid injection if symptoms have not improved. It was also recommended that pt work with physical  therapy. At this time pt would like to defer a steroid injection due to concerns of her blood sugar regulation. Pt was previously seen by physical therapy for R  hip and R knee pain which responded well to therapy. She also has a history of chronic low back pain which started initially 33 years ago when she was pregnant. She was prescribed Gabapentin and also advised to start physical therapy as symptoms will allow.  History from 05/11/22 Pt reports history of chronic R hip pain for decades with recent onset of R knee pain in August 2023. No known trauma but pt thinks she might have aggravated her knee walking across uneven grass. She saw [name] and eventually had steroid injections in her R greater trochanter, R knee injection, and R pes bursa on 04/18/22. Radiographs (03/14/22) of R knee showed mild degenerative changes at the patellofemoral joint, worse laterally. She receives chiropractic care every 2 weeks for her low back/hip pain and also sees massage therapy once/month.   L knee radiographs: 07/26/22: IMPRESSION: Severe medial and mild-to-moderate patellofemoral compartment osteoarthritis.  PAIN:    Pain Intensity: Present: 3/10, Best: 0/10, Worst: 8/10 Pain location: Medial L knee pain radiating up inner thigh and down to the L foot; Pain Quality: sharp Radiating: Yes, up the medial L thigh and down to the L foot, occasional L toe throbbing  Numbness/Tingling: Yes, lateral L knee; Focal Weakness: Yes, L knee feels unstable and buckles, no hyperextension Aggravating factors: Walking too fast, extended standing, standing after extended sitting, steps; Relieving factors: resting, ice, elevation, heat, antiinflammatories, knee brace; 24-hour pain behavior: worsens with activity as day progresses History of prior back, hip, knee injury, pain, surgery, or therapy: Yes, history of chronic back, R hip/knee pain, and L hip pain. No prior history of orthopedic surgery to back, hip, or  knee. Dominant hand: right Imaging: Yes, see history Red flags: Negative for bowel/bladder changes, saddle paresthesia, personal history of cancer, h/o spinal tumors, h/o compression fx, h/o abdominal aneurysm, abdominal pain, chills/fever, night sweats, nausea, vomiting, and unrelenting pain;  PRECAUTIONS: None  WEIGHT BEARING RESTRICTIONS: No  FALLS: Has patient fallen in last 6 months? 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   Hobbies: Pottery (currently not limited but initially had trouble sitting at the wheel), gardening/yard work;  Patient Goals: walking across the yard with improved confidence, lifting heavier objects at home without pain in hip, working the yard, "I would like to be able to get back to walking."  OBJECTIVE:  Patient Surveys  LEFS To be completed FOTO 47, predicted improvement to 34  Cognition Patient is oriented to person, place, and time.  Recent memory is intact.  Remote memory is intact.  Attention span and concentration are intact.  Expressive speech is intact.  Patient's fund of knowledge is within normal limits for educational level.    Gross Musculoskeletal Assessment Tremor: None Bulk: Normal Tone: Normal  GAIT: Full gait assessment deferred however pt does demonstrate medial whip of R heel and bilateral ankle pronation during gait. No excessive frontal plane motion of pelvis noted. Gait does appears slightly antalgic  Posture:  Forward head/rounded shoulders in sitting, full posture assessment deferred;  AROM AROM (Normal range in degrees) AROM   Lumbar   Flexion (65) Moderate loss of segmental flexion  Extension (30) Mild loss  Right  lateral flexion (25) Mild loss  Left lateral flexion (25) Mild loss  Right rotation (30) Mild loss  Left rotation (30) Mild loss   AROM (Normal range in degrees) AROM    Hip Right Left  Flexion (125) WNL WNL  Extension (15)    Abduction (40) 25 25  Adduction     Internal Rotation (45)    External Rotation (45)        Knee    Flexion (135) 125*, soft end feel 125*, soft end feel  Extension (0) 0 0      Ankle    Dorsiflexion (20)    (* = pain; Blank rows = not tested)  LE MMT: MMT (out of 5) Right  Left   Hip flexion 5 4+  Hip extension    Hip abduction    Hip adduction    Hip internal rotation 5 4+  Hip external rotation 5 4+  Knee flexion    Knee extension 5 5  Ankle dorsiflexion 5 4+  Ankle plantarflexion Stron5 Strong  (* = pain; Blank rows = not tested)  Muscle Length Hamstrings: R: Positive for tightness at 80 degrees L: Positive for tightness at 80 degrees  Quadriceps Michela Pitcher): R: Not examined L: Not examined Hip flexors Maisie Fus): R: Not examined L: Not examined IT band Claiborne Rigg): R: Not examined L: Not examined  Palpation Location LEFT  RIGHT           Quadriceps 0   Medial Hamstrings 0   Lateral Hamstrings 0   Lateral Hamstring tendon 1   Medial Hamstring tendon 1   Quadriceps tendon 0   Patella 0   Patellar Tendon 0   Tibial Tuberosity 1   Medial joint line 1   Lateral joint line 1   MCL 1   LCL 1   Adductor Tubercle 1   Pes Anserine tendon 1   Infrapatellar fat pad 0   Fibular head 1   Popliteal fossa 0   (Blank rows = not tested) Graded on 0-4 scale (0 = no pain, 1 = pain, 2 = pain with wincing/grimacing/flinching, 3 = pain with withdrawal, 4 = unwilling to allow palpation), (Blank rows = not tested)  SPECIAL TESTS  Ligamentous Stability  ACL: Lachman's: R: Not examined L: Negative Active Lachman's: R: Not examined L: Negative Anterior Drawer: R: Not examined L: Negative Pivot Shift: R: Not examined L: Negative  PCL: Posterior Drawer:  R: Not examined L: Negative Reverse Lachman's: R: Not examined L: Negative Posterior Sag Sign: R: Not examined L: Negative  MCL: Valgus Stress (30 degrees flexion): R: Not examined L: Negative  LCL: Varus Stress (30 degrees flexion): R: Not examined L: Negative  Meniscus Tests McMurray's Test:  Medial Meniscus (Tibial ER): R: Not examined L: Negative Lateral Meniscus (Tibial IR): R: Not examined L: Negative Thessaly: R: Not examined L: Not examined Ege's: R: Not examined L: Not examined Effusion: R: Not examined L: Negative   Patellofemoral Pain Syndrome Patellar Tilt (Lateral): R: Not examined L: Negative Patellar Apprehension: R: Not examined L: Not examined Squatting pain: R: Not examined L: Positive Stair climbing pain: R: Not examined L: Positive Kneeling pain: R: Not examined L: Positive Resisted knee extension pain: R: Not examined L: Negative Compression: R: Not examined L: Positive  Patellar Tendinopathy Inferior pole palpation with anterior tilt: R: Not examined L: Negative  Passive Accessory Intervertebral Motion Deferred  Hip: FABER (SN 81): R: Positive L: Negative FADIR (SN 94): R: Positive L: Negative Hip scour (SN 50): R: Negative  L: Negative Figure 4: R: Negative L: Positive for lateral L hip pain  SIJ:  Thigh Thrust (SN 88, -LR 0.18) : R: Not examined L: Not examined  Piriformis Syndrome: FAIR Test (SN 88, SP 83): R: Not examined L: Not examined  Functional Tasks Deferred   Beighton Scale: Deferred, denies history of hypermobility   TODAY'S TREATMENT    SUBJECTIVE: Pt reports continued L lateral foot/ankle pain today. She saw [name] who encouraged her to restart her diclofenac, utilize a lace up ASO brace, and to address this issue with PT.   PAIN: 1/10 L lateral ankle pain;   Ther-ex Updated outcome measures: FOTO: 55 LEFS: 61/80; Hip abduction strength R/L: 26.0, 30.0, 28.0 (28.0#) / 26.0, 26.0, 28.0 (26.7#);  Supine L SLR with  4# ankle weight (AW) 2 x 10; Hooklying clams with manual resistance 2 x 15; Hooklying adductor squeeze with manual resistance 2 x 15; Supine L SAQ over bolster with heavy manual resistance 2 x 15; R sidelying L hip abduction with 4# AW 2 x 10; R sidelying L reverse clam with 4# AW 2 x 10;   PATIENT EDUCATION:  Education details: Pt educated throughout session about proper posture and technique with exercises. Improved exercise technique, movement at target joints, use of target muscles after min to mod verbal, visual, tactile cues. Discharge Person educated: Patient Education method: Explanation Education comprehension: verbalized understanding   HOME EXERCISE PROGRAM:  Access Code: [email or web address] URL: [email or web address] Date: 09/07/2022 Prepared by: Janet Berlin  Exercises - Gastroc Stretch on Wall  - 2 x daily - 7 x weekly - 1 sets - 2 reps - 60 hold - Seated Hamstring Stretch  - 2 x daily - 7 x weekly - 1 sets - 2 reps - 60 hold - Modified Thomas Stretch  - 2 x daily - 7 x weekly - 1 sets - 2- reps - 60 hold - Sit to Stand  - 1 x daily - 7 x weekly - 2 sets - 15 reps   GOALS: Goals reviewed with patient? Yes  SHORT TERM GOALS: Target date: 09/08/2022   Pt will be independent with HEP to improve strength and decrease knee pain to improve pain-free function at home and work. Baseline:  Goal status: ACHIEVED   LONG TERM GOALS: Target date: 12/01/2022   Pt will increase FOTO to at least 58 to demonstrate significant improvement in function at home and work related to knee pain  Baseline: 08/11/22: 47; 09/29/22: 47; 10/20/22: 55; Goal status: PARTIALLY MET  2.  Pt will be able to bend, squat, and walk across an uneven yard without an increase in her L knee pain in order to resume working in her yard/garden as well as return to walking for exercise with less pain Baseline: 09/29/22: Slight improvement; 10/20/22: Slight improvement and increased confidence; Goal status:  PARTIALLY MET  3.  Pt will increase LEFS score by at least 9 points in order demonstrate clinically significant reduction in knee pain/disability.       Baseline: 08/11/22: 43/80; 09/29/22: 43/80; 10/20/22: 61; Goal status: ACHIEVED;  4.  Pt will increase strength of L hip abduction to within 10% of R side in order to demonstrate improvement in strength and function and improved stability at left knee . Baseline: 08/11/22: To be completed; 08/24/22: Hip abduction R/L: 26.2#/17.7#; 09/29/22: Hip abduction R/L: 25.5, 24, 27.0 (25.5#) /22.5, 20.0, 22.0 (21.5#); Hip abduction strength R/L: 26.0, 30.0, 28.0 (28.0#) / 26.0, 26.0, 28.0 (26.7#);  Goal status: ACHIEVED  ASSESSMENT:  CLINICAL IMPRESSION: Plan to discharge pt from therapy for her L knee and perform an evaluation at next visit for L peroneal tendonitis. Updated outcome measures today. Her LEFS and FOTO improved considerably since the initial evaluation. She also reports mild improvement in her ability and confidence to bend, squat, and walk across an uneven yard with respect to her L knee pain. Her L hip abduction strength improved from 17.7# at the initial evaluation to 26.7# today. She is reporting continued L lateral ankle/foot pain which will be evaluated at next appointment.   OBJECTIVE IMPAIRMENTS: Abnormal gait, decreased strength, and pain.   ACTIVITY LIMITATIONS: lifting, standing, squatting, and stairs  PARTICIPATION LIMITATIONS: cleaning, shopping, community activity, occupation, and yard work  PERSONAL FACTORS: Age, Time since onset of injury/illness/exacerbation, and 3+ comorbidities: DM, obesity, OSA, and afib  are also affecting patient's functional outcome.   REHAB POTENTIAL: Fair    CLINICAL DECISION MAKING: Unstable/unpredictable  EVALUATION COMPLEXITY: Moderate   PLAN: PT FREQUENCY: 1-2x/week  PT DURATION: 8 weeks  PLANNED INTERVENTIONS: Therapeutic exercises, Therapeutic activity, Neuromuscular re-education,  Balance training, Gait training, Patient/Family education, Self Care, Joint mobilization, Joint manipulation, Vestibular training, Canalith repositioning, Orthotic/Fit training, DME instructions, Dry Needling, Electrical stimulation, Spinal manipulation, Spinal mobilization, Cryotherapy, Moist heat, Taping, Traction, Ultrasound, Ionotophoresis 4mg /ml Dexamethasone, Manual therapy, and Re-evaluation.  PLAN FOR NEXT SESSION: Discharge  Sharalyn Ink Trayce Maino PT, DPT, GCS  1:07 PM,10/20/22

## 2022-10-20 ENCOUNTER — Ambulatory Visit: Payer: Medicare Other | Attending: Family Medicine

## 2022-10-20 DIAGNOSIS — G8929 Other chronic pain: Secondary | ICD-10-CM | POA: Diagnosis not present

## 2022-10-20 DIAGNOSIS — M25551 Pain in right hip: Secondary | ICD-10-CM | POA: Diagnosis not present

## 2022-10-20 DIAGNOSIS — M25562 Pain in left knee: Secondary | ICD-10-CM | POA: Insufficient documentation

## 2022-10-20 DIAGNOSIS — M7672 Peroneal tendinitis, left leg: Secondary | ICD-10-CM | POA: Diagnosis not present

## 2022-10-20 DIAGNOSIS — M25572 Pain in left ankle and joints of left foot: Secondary | ICD-10-CM | POA: Insufficient documentation

## 2022-10-24 ENCOUNTER — Ambulatory Visit: Payer: Medicare Other

## 2022-10-24 DIAGNOSIS — M25572 Pain in left ankle and joints of left foot: Secondary | ICD-10-CM | POA: Diagnosis not present

## 2022-10-24 DIAGNOSIS — M25551 Pain in right hip: Secondary | ICD-10-CM | POA: Diagnosis not present

## 2022-10-24 DIAGNOSIS — G8929 Other chronic pain: Secondary | ICD-10-CM | POA: Diagnosis not present

## 2022-10-24 DIAGNOSIS — M7672 Peroneal tendinitis, left leg: Secondary | ICD-10-CM | POA: Diagnosis not present

## 2022-10-24 DIAGNOSIS — M25562 Pain in left knee: Secondary | ICD-10-CM | POA: Diagnosis not present

## 2022-10-24 LAB — MICROALBUMIN / CREATININE URINE RATIO: Microalb Creat Ratio: 29.999

## 2022-10-24 LAB — COMPREHENSIVE METABOLIC PANEL: eGFR: 90.001

## 2022-10-24 LAB — BASIC METABOLIC PANEL: Creatinine: 0.7 (ref 0.5–1.1)

## 2022-10-24 NOTE — Therapy (Signed)
OUTPATIENT PHYSICAL THERAPY ANKLE EVALUATION  Patient Name: Brandy [name] MRN: [id number] DOB:[birth year], 67 y.o., female Today's Date: 10/26/2022  END OF SESSION:  PT End of Session - 10/26/22 1301     Visit Number 1    Number of Visits 17    Date for PT Re-Evaluation 12/19/22    Authorization Type eval: 10/24/22, [employer] Medicare 2024  [id number] on [place]  $10 copay  no auth req    PT Start Time 1147    PT Stop Time 1230    PT Time Calculation (min) 43 min    Activity Tolerance Patient tolerated treatment well    Behavior During Therapy [facility] for tasks assessed/performed            Past Medical History:  Diagnosis Date   Acute lower GI bleeding 01/19/2020   Arthritis    Atrial fibrillation with rapid ventricular response (HCC)    Chicken pox    Chronic systolic CHF (congestive heart failure) (HCC)    a. 03/2016 Echo: Ef 15-20%, diff HK, ant AK;  b. 05/2016 Echo: Ef 30-35%, diff HK, mildly dil LA/RA.   Diabetes mellitus without complication (HCC)    Dysrhythmia    GERD (gastroesophageal reflux disease)    Heart murmur    Hip discomfort    been going on for 40 years    History of kidney stones    Hyperlipidemia associated with type 2 diabetes mellitus (HCC) 05/12/2021   Hypertension    Moderate mitral regurgitation    a. 03/2016 Echo: mod MR in setting of LV dysfxn.   Motion sickness    car - back seat   NICM (nonischemic cardiomyopathy) (HCC)    a. 03/2016 Echo: EF 15-20%, diff HK, ant AK, mod MR, mod dil LA, mildly dil RA;  b. 04/2016 Cath: nl cors;  c. 05/2016 Echo: EF 30-35%, diff HK.   Obesity    Obstructive sleep apnea    compliant with CPAP   Persistent atrial fibrillation (HCC)    a. Dx 03/2016;  b. CHA2DS2VASc = 2-->Eliquis 5mg  BID;  b. 05/2016 Failed DCCV x 4.   Sleep apnea    Stomach irritation    Visit for monitoring Tikosyn therapy 07/24/2016   Past Surgical History:  Procedure Laterality Date   ATRIAL FIBRILLATION ABLATION N/A 12/09/2020   Procedure: ATRIAL  FIBRILLATION ABLATION;  Surgeon: Lanier Prude, MD;  Location: [facility];  Service: Cardiovascular;  Laterality: N/A;   BIOPSY N/A 01/12/2020   Procedure: BIOPSY;  Surgeon: Pasty Spillers, MD;  Location: [facility];  Service: Endoscopy;  Laterality: N/A;   CARDIAC CATHETERIZATION N/A 04/17/2016   Procedure: Left Heart Cath and Coronary Angiography;  Surgeon: Iran Ouch, MD;  Location: [facility] INVASIVE CV LAB;  Service: Cardiovascular;  Laterality: N/A;   CARDIOVERSION N/A 10/22/2020   Procedure: CARDIOVERSION;  Surgeon: Yvonne Kendall, MD;  Location: [facility] ORS;  Service: Cardiovascular;  Laterality: N/A;   CHOLECYSTECTOMY     COLON SURGERY  2021   COLONOSCOPY N/A 01/20/2020   Procedure: COLONOSCOPY;  Surgeon: Regis Bill, MD;  Location: [facility] ENDOSCOPY;  Service: Endoscopy;  Laterality: N/A;   COLONOSCOPY WITH PROPOFOL N/A 01/12/2020   Procedure: COLONOSCOPY WITH PROPOFOL;  Surgeon: Pasty Spillers, MD;  Location: [facility];  Service: Endoscopy;  Laterality: N/A;  Diabetic - oral meds sleep apnea   ELECTROPHYSIOLOGIC STUDY N/A 05/26/2016   Procedure: Cardioversion;  Surgeon: Iran Ouch, MD;  Location: [facility] OR[facility];  Service: Cardiovascular;  Laterality: N/A;   ESOPHAGOGASTRODUODENOSCOPY (EGD) WITH PROPOFOL N/A 01/12/2020   Procedure: ESOPHAGOGASTRODUODENOSCOPY (EGD) WITH PROPOFOL;  Surgeon: Pasty Spillers, MD;  Location: [facility];  Service: Endoscopy;  Laterality: N/A;   POLYPECTOMY N/A 01/12/2020   Procedure: POLYPECTOMY;  Surgeon: Pasty Spillers, MD;  Location: [facility];  Service: Endoscopy;  Laterality: N/A;   Patient Active Problem List   Diagnosis Date Noted   Peroneal tendinitis, left 10/13/2022   Left lumbar radiculopathy 07/26/2022   Arthralgia of left knee 07/26/2022   Pes anserinus bursitis of right knee 04/19/2022   Primary osteoarthritis of right knee 03/27/2022   Greater trochanteric pain  syndrome of right lower extremity 03/27/2022   It band syndrome, right 03/27/2022   Hyperlipidemia associated with type 2 diabetes mellitus (HCC) 05/12/2021   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   Varicose veins of leg with pain, bilateral 07/06/2020   Acquired thrombophilia (HCC) 05/24/2020   Type II diabetes mellitus with complication (HCC) 01/19/2020   Gastric polyp    Colon polyps    Varicose veins of both lower extremities 10/20/2019   Dysphagia 10/20/2019   Osteoarthritis of right hand 10/31/2016   Nonischemic cardiomyopathy (HCC) 04/14/2016   BMI 33.0-33.9,adult 04/14/2016    PCP: Bari Edward MD  REFERRING PROVIDER: Joseph Berkshire MD  REFERRING DIAG: [phone] (ICD-10-CM) - Peroneal tendinitis, left   Rationale for Evaluation and Treatment: Rehabilitation  THERAPY DIAG: Pain in left ankle and joints of left foot  ONSET DATE: 09/26/22 (approximate)  FOLLOW-UP APPT SCHEDULED WITH REFERRING PROVIDER: No, but sees PCP 12/20/22   SUBJECTIVE:                                                                                                                                                                                         SUBJECTIVE STATEMENT:  L lateral ankle pain.   PERTINENT HISTORY:  Pt reports onset of L lateral ankle and base of 5th metatarsal pain after exercising in mid May 2024. No trauma reported however pt states that she had started performing cable resisted hip abduction as well as walking with weights. She has history of calcaneal bone spurs and has seen podiatry but no history of surgery to L foot/ankle. She saw [name] who encouraged her to restart diclofenac scheduled fpr 2 weeks and then dose as needed. She was also encouraged to use a lace up ASO brace which she has been wearing with improvement. She was noticing improvement in her symptoms until she stepped in a hole this past Saturday and had a painful pop. She denies bruising or swelling  afterwards. She continued walking on her LLE and  over the next 24 hours noted gradual improvement.  09/19/22: Left foot 3 views AP, lateral, lateral oblique: Dorsiflex ptosis present at the midfoot level over likely the second and third tarsometatarsal joints.  Slight pes cavus foot structure.  Osteoarthritic changes present at the second and third tarsometatarsal joints.  Posterior and plantar calcaneal heel spurs present.  Mild metatarsus adductus.   PAIN:    Pain Intensity: Present: 1/10, Best: 0/10, Worst: 10/10 Pain location: L lateral ankle posterior to lateral malleoli and into L foot.  Pain Quality: burning, sharp, and aching ("under ankle bone") Radiating: No  Swelling: No  Popping, catching, locking: Yes  Numbness/Tingling: No Focal Weakness: No but has a history of feeling chronic instability in L ankle; Aggravating factors: walking without brace, walking on uneven surfaces,  Relieving factors: ice, diclofenac, gabapentin, Tylenol; 24-hour pain behavior: worse at the end of the day; History of prior back, hip, knee, or ankle injury, pain, surgery, or therapy: No Dominant hand: right Imaging: Yes, see history; Typical footwear:  Red flags: Negative for personal history of cancer, chills/fever, night sweats, nausea, vomiting, unrelenting pain): Negative  PRECAUTIONS: None  WEIGHT BEARING RESTRICTIONS: No  FALLS: Has patient fallen in last 6 months? 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   Hobbies: Pottery (currently not limited but initially had trouble sitting at the wheel), gardening/yard work;  Patient Goals: walking across the yard with improved confidence, lifting  heavier objects at home without pain in hip, working the yard, "I would like to be able to get back to walking."   OBJECTIVE:   Patient Surveys  FOTO 38, predicted improvement to 41  Cognition Patient is oriented to person, place, and time.  Recent memory is intact.  Remote memory is intact.  Attention span and concentration are intact.  Expressive speech is intact.  Patient's fund of knowledge is within normal limits for educational level.    Gross Musculoskeletal Assessment Bulk: Normal Tone: Normal No trophic changes noted to foot/ankle. No ecchymosis, erythema, or edema noted.  Patient has a high resting arch with minimal arch collapse during gait  GAIT: Comments: No excessive pronation/supination noted during gait.  Minimal pronation noted.  Gait is antalgic with decreased step length on right lower extremity.  Posture: No gross deficits in the posture noted that are contributing to the left ankle pain.  AROM AROM (Normal range in degrees) AROM   Lumbar   Flexion (65) Moderate loss of segmental flexion  Extension (30) Mild loss  Right lateral flexion (25) Mild loss  Left lateral flexion (25) Mild loss  Right rotation (30) Mild loss  Left rotation (30) Mild loss   AROM (Normal range in degrees) AROM   Hip Right Left  Flexion (125)  WNL  Extension (15)    Abduction (40)  WNL  Adduction   WNL  Internal Rotation (45)  Mod/severe loss  External Rotation (45)  Mild loss      Knee    Flexion (135)  WNL (soft end feel)  Extension (0)  0      Ankle    Dorsiflexion (20) 18 18  Plantarflexion (50) 64 54  Inversion (35) 24 22  Eversion (15) 16 14  (* =  pain; Blank rows = not tested)  LE MMT: MMT (out of 5) Right  Left   Hip flexion 5 5  Hip extension    Hip abduction 28.0# (measured at last DC) 26.7# (measured at last DC)  Hip adduction    Hip internal rotation 5 5  Hip external rotation 5 5  Knee flexion 5 5  Knee extension 5 5  Ankle dorsiflexion 5 5*   Ankle plantarflexion 5 5*  Ankle inversion 5 5  Ankle eversion 5 5*  (* = pain; Blank rows = not tested)   Sensation Grossly intact to light touch throughout bilateral LEs as determined by testing dermatomes L2-S2. Proprioception, stereognosis, and hot/cold testing deferred on this date.  Reflexes Deferred  Muscle Length Deferred  Palpation Generally painful to palpation around anterior and left lateral ankle.  Pain with palpation along peroneal muscles/tendons, at the base of the fifth metatarsal, and in the arch of the foot following the tendon of peroneus longus.  Pain to palpation at the sinus tarsus as well as over the deltoid ligament.  Painful palpation along distal end of fibula.  Passive Accessory Motion Superior Tibiofibular Joint: WNL Inferior Tibiofibular Joint: WNL Talocrural Joint Distraction: WNL Talocrural Joint AP: Hypomobile Talocrural Joint PA: WNL  VASCULAR Dorsalis pedis and posterior tibial pulses are palpable  SPECIAL TESTS Ligamentous Integrity Anterior Drawer (ATF, 10-15 plantarflexion with anterior translation): Negative Talar Tilt (CFL, inversion): Negative Eversion Stress Test (Deltoid, eversion): Negative External Rotation Test (High ankle, dorsiflexion and external rotation): Negative Squeeze Test (High ankle): Negative Impingment Sign (Dorsiflexion and eversion): Negative  Achilles Integrity Thompson Test: Negative  Fracture Screening Metatarsal Axial Loading: Negative Tap/Percussion Test: Negative Vibration Test: Not examined  Pronation/Supination Navicular Drop: Negative  Nerve Test Tarsal Tunnel Test (maximal DF, EV, toe ext with tapping over tarsal tunnel): Not examined Test for Morton's Neuroma (compress metatarsals and mobilize): Not examined  Other Windlass Mechanism Test: Not examined   Beighton Scale: Deferred   TODAY'S TREATMENT:  Manual Therapy  STM to left lower leg along peroneal muscles and tendons including  posterior to the lateral malleolus, near the insertion of the peroneus brevis at the base of the fifth metatarsal, and along the transverse arch of the foot.   PATIENT EDUCATION:  Education details: Plan of care, continue isometrics Person educated: Patient Education method: Explanation Education comprehension: verbalized understanding   HOME EXERCISE PROGRAM:  Continue low-level isometrics for left ankle eversion.  Encouraged force around 25% of MVC.   ASSESSMENT:  CLINICAL IMPRESSION: Patient is a 67 y.o. female who was seen today for physical therapy evaluation and treatment for L lateral ankle pain.   OBJECTIVE IMPAIRMENTS: Abnormal gait, difficulty walking, decreased ROM, and pain.   ACTIVITY LIMITATIONS: squatting and stairs  PARTICIPATION LIMITATIONS: community activity, occupation, and yard work  PERSONAL FACTORS: Age, Past/current experiences, and 3+ comorbidities: DMII, lumbar radiculopathy, and bilateral knee OA  are also affecting patient's functional outcome.   REHAB POTENTIAL: Excellent  CLINICAL DECISION MAKING: Stable/uncomplicated  EVALUATION COMPLEXITY: Low   GOALS: Goals reviewed with patient? No  SHORT TERM GOALS: Target date: 11/21/2022  Pt will be independent with HEP to improve strength and decrease ankle pain to improve pain-free function at home and work. Baseline:  Goal status: INITIAL   LONG TERM GOALS: Target date: 12/19/2022  Pt will increase FOTO to at least 60 to demonstrate significant improvement in function at home and work related to ankle pain  Baseline: 10/24/22: 38 Goal status: INITIAL  2.  Pt will decrease worst ankle pain by at least 3 points on the NPRS in order to demonstrate clinically significant reduction in ankle pain. Baseline: 10/24/22: worst: 10/10; Goal status: INITIAL  3.  Pt will report at least 75% improvement in symptoms in order demonstrate clinically significant reduction in ankle pain/disability.       Baseline:   Goal status: INITIAL  4.  Pt will be able to bend, squat, and walk across an uneven yard without an increase in her L ankle pain in order to resume working in her yard/garden as well as return to walking for exercise with less pain Baseline: 10/24/22: Notable painful in L ankle Goal status: INITIAL   PLAN: PT FREQUENCY: 1-2x/week  PT DURATION: 8 weeks  PLANNED INTERVENTIONS: Therapeutic exercises, Therapeutic activity, Neuromuscular re-education, Balance training, Gait training, Patient/Family education, Self Care, Joint mobilization, Joint manipulation, Vestibular training, Canalith repositioning, Orthotic/Fit training, DME instructions, Dry Needling, Electrical stimulation, Spinal manipulation, Spinal mobilization, Cryotherapy, Moist heat, Taping, Traction, Ultrasound, Ionotophoresis 4mg /ml Dexamethasone, Manual therapy, and Re-evaluation.  PLAN FOR NEXT SESSION: Review left ankle isometric strengthening and progress as tolerated, manual therapy interventions to decrease pain and improve range of motion, issued HEP;   Brandy [name] PT, DPT, GCS  Brandy [name], PT 10/26/2022, 1:14 PM

## 2022-10-27 ENCOUNTER — Ambulatory Visit: Payer: Medicare Other

## 2022-10-27 DIAGNOSIS — M7672 Peroneal tendinitis, left leg: Secondary | ICD-10-CM | POA: Diagnosis not present

## 2022-10-27 DIAGNOSIS — M25572 Pain in left ankle and joints of left foot: Secondary | ICD-10-CM | POA: Diagnosis not present

## 2022-10-27 DIAGNOSIS — M25551 Pain in right hip: Secondary | ICD-10-CM | POA: Diagnosis not present

## 2022-10-27 DIAGNOSIS — M25562 Pain in left knee: Secondary | ICD-10-CM | POA: Diagnosis not present

## 2022-10-27 DIAGNOSIS — G8929 Other chronic pain: Secondary | ICD-10-CM | POA: Diagnosis not present

## 2022-10-27 NOTE — Therapy (Signed)
OUTPATIENT PHYSICAL THERAPY ANKLE TREATMENT  Patient Name: Brandy [name] MRN: [id number] DOB:Oct 03, 1955, 67 y.o., female Today's Date: 10/27/2022  END OF SESSION:  PT End of Session - 10/27/22 1019     Visit Number 2    Number of Visits 17    Date for PT Re-Evaluation 12/19/22    Authorization Type eval: 10/24/22, BC[employer] Medicare 2024  [id number] on [place]  $10 cop[place]  no auth req    PT Start Time 0930    PT Stop Time 1015    PT Time Calculation (min) 45 min    Activity Tolerance Patient tolerated treatment well    Behavior During Therapy [facility] for tasks assessed/performed            Past Medical History:  Diagnosis Date   Acute lower GI bleeding 01/19/2020   Arthritis    Atrial fibrillation with rapid ventricular response (HCC)    Chicken pox    Chronic systolic CHF (congestive heart failure) (HCC)    a. 03/2016 Echo: Ef 15-20%, diff HK, ant AK;  b. 05/2016 Echo: Ef 30-35%, diff HK, mildly dil LA/RA.   Diabetes mellitus without complication (HCC)    Dysrhythmia    GERD (gastroesophageal reflux disease)    Heart murmur    Hip discomfort    been going on for 40 years    History of kidney stones    Hyperlipidemia associated with type 2 diabetes mellitus (HCC) 05/12/2021   Hypertension    Moderate mitral regurgitation    a. 03/2016 Echo: mod MR in setting of LV dysfxn.   Motion sickness    car - back seat   NICM (nonischemic cardiomyopathy) (HCC)    a. 03/2016 Echo: EF 15-20%, diff HK, ant AK, mod MR, mod dil LA, mildly dil RA;  b. 04/2016 Cath: nl cors;  c. 05/2016 Echo: EF 30-35%, diff HK.   Obesity    Obstructive sleep apnea    compliant with CPAP   Persistent atrial fibrillation (HCC)    a. Dx 03/2016;  b. CHA2DS2VASc = 2-->Eliquis 5mg  BID;  b. 05/2016 Failed DCCV x 4.   Sleep apnea    Stomach irritation    Visit for monitoring Tikosyn therapy 07/24/2016   Past Surgical History:  Procedure Laterality Date   ATRIAL FIBRILLATION ABLATION N/A 12/09/2020   Procedure: ATRIAL  FIBRILLATION ABLATION;  Surgeon: Lanier Prude, MD;  Location: [facility];  Service: Cardiovascular;  Laterality: N/A;   BIOPSY N/A 01/12/2020   Procedure: BIOPSY;  Surgeon: Pasty Spillers, MD;  Location: [facility];  Service: Endoscopy;  Laterality: N/A;   CARDIAC CATHETERIZATION N/A 04/17/2016   Procedure: Left Heart Cath and Coronary Angiography;  Surgeon: Iran Ouch, MD;  Location: [facility];  Service: Cardiovascular;  Laterality: N/A;   CARDIOVERSION N/A 10/22/2020   Procedure: CARDIOVERSION;  Surgeon: Yvonne Kendall, MD;  Location: [facility] OR[facility];  Service: Cardiovascular;  Laterality: N/A;   CHOLECYSTECTOMY     COLON SURGERY  2021   COLONOSCOPY N/A 01/20/2020   Procedure: COLONOSCOPY;  Surgeon: Regis Bill, MD;  Location: [facility];  Service: Endoscopy;  Laterality: N/A;   COLONOSCOPY WITH PROPOFOL N/A 01/12/2020   Procedure: COLONOSCOPY WITH PROPOFOL;  Surgeon: Pasty Spillers, MD;  Location: [facility];  Service: Endoscopy;  Laterality: N/A;  Diabetic - oral meds sleep apnea   ELECTROPHYSIOLOGIC STUDY N/A 05/26/2016   Procedure: Cardioversion;  Surgeon: Iran Ouch, MD;  Location: [facility];  Service: Cardiovascular;  Laterality: N/A;   ESOPHAGOGASTRODUODENOSCOPY (EGD) WITH PROPOFOL N/A 01/12/2020   Procedure: ESOPHAGOGASTRODUODENOSCOPY (EGD) WITH PROPOFOL;  Surgeon: Pasty Spillers, MD;  Location: [facility];  Service: Endoscopy;  Laterality: N/A;   POLYPECTOMY N/A 01/12/2020   Procedure: POLYPECTOMY;  Surgeon: Pasty Spillers, MD;  Location: [facility];  Service: Endoscopy;  Laterality: N/A;   Patient Active Problem List   Diagnosis Date Noted   Peroneal tendinitis, left 10/13/2022   Left lumbar radiculopathy 07/26/2022   Arthralgia of left knee 07/26/2022   Pes anserinus bursitis of right knee 04/19/2022   Primary osteoarthritis of right knee 03/27/2022   Greater trochanteric pain  syndrome of right lower extremity 03/27/2022   It band syndrome, right 03/27/2022   Hyperlipidemia associated with type 2 diabetes mellitus (HCC) 05/12/2021   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   Varicose veins of leg with pain, bilateral 07/06/2020   Acquired thrombophilia (HCC) 05/24/2020   Type II diabetes mellitus with complication (HCC) 01/19/2020   Gastric polyp    Colon polyps    Varicose veins of both lower extremities 10/20/2019   Dysphagia 10/20/2019   Osteoarthritis of right hand 10/31/2016   Nonischemic cardiomyopathy (HCC) 04/14/2016   BMI 33.0-33.9,adult 04/14/2016    PCP: Bari Edward MD  REFERRING PROVIDER: Joseph Berkshire MD  REFERRING DIAG: [phone] (ICD-10-CM) - Peroneal tendinitis, left   Rationale for Evaluation and Treatment: Rehabilitation  THERAPY DIAG: Pain in left ankle and joints of left foot  ONSET DATE: 09/26/22 (approximate)  FOLLOW-UP APPT SCHEDULED WITH REFERRING PROVIDER: No, but sees PCP 12/20/22  FROM INITIAL EVALUATION SUBJECTIVE:                                                                                                                                                                                         SUBJECTIVE STATEMENT:  L lateral ankle pain.   PERTINENT HISTORY:  Pt reports onset of L lateral ankle and base of 5th metatarsal pain after exercising in mid May 2024. No trauma reported however pt states that she had started performing cable resisted hip abduction as well as walking with weights. She has history of calcaneal bone spurs and has seen podiatry but no history of surgery to L foot/ankle. She saw [name] who encouraged her to restart diclofenac scheduled fpr 2 weeks and then dose as needed. She was also encouraged to use a lace up ASO brace which she has been wearing with improvement. She was noticing improvement in her symptoms until she stepped in a hole this past Saturday and had a painful pop. She denies  bruising or swelling afterwards. She continued walking on her  LLE and over the next 24 hours noted gradual improvement.  09/19/22: Left foot 3 views AP, lateral, lateral oblique: Dorsiflex ptosis present at the midfoot level over likely the second and third tarsometatarsal joints.  Slight pes cavus foot structure.  Osteoarthritic changes present at the second and third tarsometatarsal joints.  Posterior and plantar calcaneal heel spurs present.  Mild metatarsus adductus.   PAIN:    Pain Intensity: Present: 1/10, Best: 0/10, Worst: 10/10 Pain location: L lateral ankle posterior to lateral malleoli and into L foot.  Pain Quality: burning, sharp, and aching ("under ankle bone") Radiating: No  Swelling: No  Popping, catching, locking: Yes  Numbness/Tingling: No Focal Weakness: No but has a history of feeling chronic instability in L ankle; Aggravating factors: walking without brace, walking on uneven surfaces,  Relieving factors: ice, diclofenac, gabapentin, Tylenol; 24-hour pain behavior: worse at the end of the day; History of prior back, hip, knee, or ankle injury, pain, surgery, or therapy: No Dominant hand: right Imaging: Yes, see history; Typical footwear:  Red flags: Negative for personal history of cancer, chills/fever, night sweats, nausea, vomiting, unrelenting pain): Negative  PRECAUTIONS: None  WEIGHT BEARING RESTRICTIONS: No  FALLS: Has patient fallen in last 6 months? 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   Hobbies: Pottery (currently not limited but initially had trouble sitting at the wheel), gardening/yard work;  Patient Goals: walking across the yard with improved  confidence, lifting heavier objects at home without pain in hip, working the yard, "I would like to be able to get back to walking."   OBJECTIVE:   Patient Surveys  FOTO 38, predicted improvement to 10  Cognition Patient is oriented to person, place, and time.  Recent memory is intact.  Remote memory is intact.  Attention span and concentration are intact.  Expressive speech is intact.  Patient's fund of knowledge is within normal limits for educational level.    Gross Musculoskeletal Assessment Bulk: Normal Tone: Normal No trophic changes noted to foot/ankle. No ecchymosis, erythema, or edema noted.  Patient has a high resting arch with minimal arch collapse during gait  GAIT: Comments: No excessive pronation/supination noted during gait.  Minimal pronation noted.  Gait is antalgic with decreased step length on right lower extremity.  Posture: No gross deficits in the posture noted that are contributing to the left ankle pain.  AROM AROM (Normal range in degrees) AROM   Lumbar   Flexion (65) Moderate loss of segmental flexion  Extension (30) Mild loss  Right lateral flexion (25) Mild loss  Left lateral flexion (25) Mild loss  Right rotation (30) Mild loss  Left rotation (30) Mild loss   AROM (Normal range in degrees) AROM   Hip Right Left  Flexion (125)  WNL  Extension (15)    Abduction (40)  WNL  Adduction   WNL  Internal Rotation (45)  Mod/severe loss  External Rotation (45)  Mild loss      Knee    Flexion (135)  WNL (soft end feel)  Extension (0)  0      Ankle    Dorsiflexion (20) 18 18  Plantarflexion (50) 64 54  Inversion (35) 24 22  Eversion (15)  16 14  (* = pain; Blank rows = not tested)  LE MMT: MMT (out of 5) Right  Left   Hip flexion 5 5  Hip extension    Hip abduction 28.0# (measured at last DC) 26.7# (measured at last DC)  Hip adduction    Hip internal rotation 5 5  Hip external rotation 5 5  Knee flexion 5 5  Knee extension 5 5  Ankle  dorsiflexion 5 5*  Ankle plantarflexion 5 5*  Ankle inversion 5 5  Ankle eversion 5 5*  (* = pain; Blank rows = not tested)   Sensation Grossly intact to light touch throughout bilateral LEs as determined by testing dermatomes L2-S2. Proprioception, stereognosis, and hot/cold testing deferred on this date.  Reflexes Deferred  Muscle Length Deferred  Palpation Generally painful to palpation around anterior and left lateral ankle.  Pain with palpation along peroneal muscles/tendons, at the base of the fifth metatarsal, and in the arch of the foot following the tendon of peroneus longus.  Pain to palpation at the sinus tarsus as well as over the deltoid ligament.  Painful palpation along distal end of fibula.  Passive Accessory Motion Superior Tibiofibular Joint: WNL Inferior Tibiofibular Joint: WNL Talocrural Joint Distraction: WNL Talocrural Joint AP: Hypomobile Talocrural Joint PA: WNL  VASCULAR Dorsalis pedis and posterior tibial pulses are palpable  SPECIAL TESTS Ligamentous Integrity Anterior Drawer (ATF, 10-15 plantarflexion with anterior translation): Negative Talar Tilt (CFL, inversion): Negative Eversion Stress Test (Deltoid, eversion): Negative External Rotation Test (High ankle, dorsiflexion and external rotation): Negative Squeeze Test (High ankle): Negative Impingment Sign (Dorsiflexion and eversion): Negative  Achilles Integrity Thompson Test: Negative  Fracture Screening Metatarsal Axial Loading: Negative Tap/Percussion Test: Negative Vibration Test: Not examined  Pronation/Supination Navicular Drop: Negative  Nerve Test Tarsal Tunnel Test (maximal DF, EV, toe ext with tapping over tarsal tunnel): Not examined Test for Morton's Neuroma (compress metatarsals and mobilize): Not examined  Other Windlass Mechanism Test: Not examined   Beighton Scale: Deferred   TODAY'S TREATMENT:   SUBJECTIVE: Pt reports that she is doing alright today. Pain  fluctuates from day to day and based on activity. She continues wearing her lace-up ASO brace. HEP is going well. No specific questions currently.   PAIN: L ankle and plantar heel pain   Trigger Point Dry Needling (TDN), unbilled Education previously performed with patient regarding potential benefit of TDN. Previously reviewed precautions and risks with patient. Pt provided verbal consent to treatment. Using clean technique TDN performed to L peroneus longus/brevis with 3, 0.25 x 40 single needle placements with local deep ache reported. Pistoning technique utilized.    Manual Therapy  Moist heat pack applied to L ankle at start of session during interval history; STM to left lower leg along peroneal muscles and tendons including posterior to the lateral malleolus, near the insertion of the peroneus brevis at the base of the fifth metatarsal, and along the transverse arch of the foot. Also utilized instrument assist with sweeping strokes L ankle talocrural distraction, grade III, 30s/bout x 3 bouts; L ankle talocrural A/P mobilizations at available end range dorsiflexion, grade III, 30s/bout x 3 bouts; L ankle subtalar distraction, grade III, 30s/bout x 3 bouts;   Ther-ex  Isometric L ankle eversion, inversion, and dorsiflexion 10s hold/10s relax x 10 each; Isotonic L ankle plantar flexion x 10; HEP updated and reviewed with patient;   PATIENT EDUCATION:  Education details: Pt educated throughout session about proper posture and technique with exercises. Improved exercise technique,  movement at target joints, use of target muscles after min to mod verbal, visual, tactile cues, updated HEP Person educated: Patient Education method: Explanation, Demonstration, Tactile cues, and Handouts Education comprehension: verbalized understanding and returned demonstration   HOME EXERCISE PROGRAM:  Access Code: [email or web address] URL: [email or web address] Date: 10/27/2022 Prepared by:  Ria Comment  Exercises - Isometric Ankle Eversion at Wall  - 2 x daily - 7 x weekly - 1 sets - 10 reps - 10s on/10s off hold - Isometric Ankle Inversion at Wall (Mirrored)  - 2 x daily - 7 x weekly - 1 sets - 10 reps - 10s on/10s off hold - Long Sitting Calf Stretch with Strap  - 2 x daily - 7 x weekly - 3 reps - 30-45s hold   ASSESSMENT:  CLINICAL IMPRESSION: Progressed manual therapy and strengthening with patient during session today. Attempted isotonic inversion however pt reports increase in pain so regressed back to isometrics. Utilized trigger point dry needling as well as IASTM. HEP progression provided and pt encouraged to follow-up as scheduled. Pt will benefit from PT services to address deficits in range of motion and pain in order to return to full function at home, work, and when working in the yard with less pain.   OBJECTIVE IMPAIRMENTS: Abnormal gait, difficulty walking, decreased ROM, and pain.   ACTIVITY LIMITATIONS: squatting and stairs  PARTICIPATION LIMITATIONS: community activity, occupation, and yard work  PERSONAL FACTORS: Age, Past/current experiences, and 3+ comorbidities: DMII, lumbar radiculopathy, and bilateral knee OA  are also affecting patient's functional outcome.   REHAB POTENTIAL: Excellent  CLINICAL DECISION MAKING: Stable/uncomplicated  EVALUATION COMPLEXITY: Low   GOALS: Goals reviewed with patient? No  SHORT TERM GOALS: Target date: 11/21/2022  Pt will be independent with HEP to improve strength and decrease ankle pain to improve pain-free function at home and work. Baseline:  Goal status: INITIAL   LONG TERM GOALS: Target date: 12/19/2022  Pt will increase FOTO to at least 60 to demonstrate significant improvement in function at home and work related to ankle pain  Baseline: 10/24/22: 38 Goal status: INITIAL  2.  Pt will decrease worst ankle pain by at least 3 points on the NPRS in order to demonstrate clinically significant reduction  in ankle pain. Baseline: 10/24/22: worst: 10/10; Goal status: INITIAL  3.  Pt will report at least 75% improvement in symptoms in order demonstrate clinically significant reduction in ankle pain/disability.       Baseline:  Goal status: INITIAL  4.  Pt will be able to bend, squat, and walk across an uneven yard without an increase in her L ankle pain in order to resume working in her yard/garden as well as return to walking for exercise with less pain Baseline: 10/24/22: Notable painful in L ankle Goal status: INITIAL   PLAN: PT FREQUENCY: 1-2x/week  PT DURATION: 8 weeks  PLANNED INTERVENTIONS: Therapeutic exercises, Therapeutic activity, Neuromuscular re-education, Balance training, Gait training, Patient/Family education, Self Care, Joint mobilization, Joint manipulation, Vestibular training, Canalith repositioning, Orthotic/Fit training, DME instructions, Dry Needling, Electrical stimulation, Spinal manipulation, Spinal mobilization, Cryotherapy, Moist heat, Taping, Traction, Ultrasound, Ionotophoresis 4mg /ml Dexamethasone, Manual therapy, and Re-evaluation.  PLAN FOR NEXT SESSION: progress strengthening as tolerated, manual therapy interventions to decrease pain and improve range of motion, review/modify HEP as needed.   Sharalyn Ink Phung Kotas PT, DPT, GCS  Ethridge Sollenberger, PT 10/27/2022, 10:51 AM

## 2022-10-29 ENCOUNTER — Other Ambulatory Visit: Payer: Self-pay | Admitting: Internal Medicine

## 2022-10-29 DIAGNOSIS — E118 Type 2 diabetes mellitus with unspecified complications: Secondary | ICD-10-CM

## 2022-10-31 ENCOUNTER — Ambulatory Visit: Payer: Medicare Other

## 2022-10-31 DIAGNOSIS — M25562 Pain in left knee: Secondary | ICD-10-CM | POA: Diagnosis not present

## 2022-10-31 DIAGNOSIS — G8929 Other chronic pain: Secondary | ICD-10-CM

## 2022-10-31 DIAGNOSIS — M25572 Pain in left ankle and joints of left foot: Secondary | ICD-10-CM

## 2022-10-31 DIAGNOSIS — M7672 Peroneal tendinitis, left leg: Secondary | ICD-10-CM | POA: Diagnosis not present

## 2022-10-31 DIAGNOSIS — M25551 Pain in right hip: Secondary | ICD-10-CM | POA: Diagnosis not present

## 2022-10-31 NOTE — Therapy (Signed)
OUTPATIENT PHYSICAL THERAPY ANKLE TREATMENT  Patient Name: Brandy [name] MRN: [id number] DOB:[birth year], 67 y.o., female Today's Date: 10/31/2022  END OF SESSION:  PT End of Session - 10/31/22 0930     Visit Number 3    Number of Visits 17    Date for PT Re-Evaluation 12/19/22    Authorization Type eval: 10/24/22, BCBS Medicare 2024  [id number] on [place]  $10 copay  no auth req    PT Start Time 0930    PT Stop Time 1015    PT Time Calculation (min) 45 min    Activity Tolerance Patient tolerated treatment well    Behavior During Therapy [facility] for tasks assessed/performed            Past Medical History:  Diagnosis Date   Acute lower GI bleeding 01/19/2020   Arthritis    Atrial fibrillation with rapid ventricular response (HCC)    Chicken pox    Chronic systolic CHF (congestive heart failure) (HCC)    a. 03/2016 Echo: Ef 15-20%, diff HK, ant AK;  b. 05/2016 Echo: Ef 30-35%, diff HK, mildly dil LA/RA.   Diabetes mellitus without complication (HCC)    Dysrhythmia    GERD (gastroesophageal reflux disease)    Heart murmur    Hip discomfort    been going on for 40 years    History of kidney stones    Hyperlipidemia associated with type 2 diabetes mellitus (HCC) 05/12/2021   Hypertension    Moderate mitral regurgitation    a. 03/2016 Echo: mod MR in setting of LV dysfxn.   Motion sickness    car - back seat   NICM (nonischemic cardiomyopathy) (HCC)    a. 03/2016 Echo: EF 15-20%, diff HK, ant AK, mod MR, mod dil LA, mildly dil RA;  b. 04/2016 Cath: nl cors;  c. 05/2016 Echo: EF 30-35%, diff HK.   Obesity    Obstructive sleep apnea    compliant with CPAP   Persistent atrial fibrillation (HCC)    a. Dx 03/2016;  b. CHA2DS2VASc = 2-->Eliquis 5mg  BID;  b. 05/2016 Failed DCCV x 4.   Sleep apnea    Stomach irritation    Visit for monitoring Tikosyn therapy 07/24/2016   Past Surgical History:  Procedure Laterality Date   ATRIAL FIBRILLATION ABLATION N/A 12/09/2020   Procedure: ATRIAL  FIBRILLATION ABLATION;  Surgeon: Lanier Prude, MD;  Location: [facility]IVE [facility];  Service: Cardiovascular;  Laterality: N/A;   BIOPSY N/A 01/12/2020   Procedure: BIOPSY;  Surgeon: Pasty Spillers, MD;  Location: [facility];  Service: Endoscopy;  Laterality: N/A;   CARDIAC CATHETERIZATION N/A 04/17/2016   Procedure: Left Heart Cath and Coronary Angiography;  Surgeon: Iran Ouch, MD;  Location: [facility] INVASIVE CV LAB;  Service: Cardiovascular;  Laterality: N/A;   CARDIOVERSION N/A 10/22/2020   Procedure: CARDIOVERSION;  Surgeon: Yvonne Kendall, MD;  Location: [facility] ORS;  Service: Cardiovascular;  Laterality: N/A;   CHOLECYSTECTOMY     COLON SURGERY  2021   COLONOSCOPY N/A 01/20/2020   Procedure: COLONOSCOPY;  Surgeon: Regis Bill, MD;  Location: [facility] ENDOSCOPY;  Service: Endoscopy;  Laterality: N/A;   COLONOSCOPY WITH PROPOFOL N/A 01/12/2020   Procedure: COLONOSCOPY WITH PROPOFOL;  Surgeon: Pasty Spillers, MD;  Location: [facility];  Service: Endoscopy;  Laterality: N/A;  Diabetic - oral meds sleep apnea   ELECTROPHYSIOLOGIC STUDY N/A 05/26/2016   Procedure: Cardioversion;  Surgeon: Iran Ouch, MD;  Location: [facility] ORS;  Service: Cardiovascular;  Laterality: N/A;   ESOPHAGOGASTRODUODENOSCOPY (EGD) WITH PROPOFOL N/A 01/12/2020   Procedure: ESOPHAGOGASTRODUODENOSCOPY (EGD) WITH PROPOFOL;  Surgeon: Pasty Spillers, MD;  Location: [facility];  Service: Endoscopy;  Laterality: N/A;   POLYPECTOMY N/A 01/12/2020   Procedure: POLYPECTOMY;  Surgeon: Pasty Spillers, MD;  Location: [facility];  Service: Endoscopy;  Laterality: N/A;   Patient Active Problem List   Diagnosis Date Noted   Peroneal tendinitis, left 10/13/2022   Left lumbar radiculopathy 07/26/2022   Arthralgia of left knee 07/26/2022   Pes anserinus bursitis of right knee 04/19/2022   Primary osteoarthritis of right knee 03/27/2022   Greater trochanteric pain  syndrome of right lower extremity 03/27/2022   It band syndrome, right 03/27/2022   Hyperlipidemia associated with type 2 diabetes mellitus (HCC) 05/12/2021   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   Varicose veins of leg with pain, bilateral 07/06/2020   Acquired thrombophilia (HCC) 05/24/2020   Type II diabetes mellitus with complication (HCC) 01/19/2020   Gastric polyp    Colon polyps    Varicose veins of both lower extremities 10/20/2019   Dysphagia 10/20/2019   Osteoarthritis of right hand 10/31/2016   Nonischemic cardiomyopathy (HCC) 04/14/2016   BMI 33.0-33.9,adult 04/14/2016    PCP: Bari Edward MD  REFERRING PROVIDER: Joseph Berkshire MD  REFERRING DIAG: [phone] (ICD-10-CM) - Peroneal tendinitis, left   Rationale for Evaluation and Treatment: Rehabilitation  THERAPY DIAG: Pain in left ankle and joints of left foot  Chronic pain of left knee  ONSET DATE: 09/26/22 (approximate)  FOLLOW-UP APPT SCHEDULED WITH REFERRING PROVIDER: No, but sees PCP 12/20/22  FROM INITIAL EVALUATION SUBJECTIVE:                                                                                                                                                                                         SUBJECTIVE STATEMENT:  L lateral ankle pain.   PERTINENT HISTORY:  Pt reports onset of L lateral ankle and base of 5th metatarsal pain after exercising in mid May 2024. No trauma reported however pt states that she had started performing cable resisted hip abduction as well as walking with weights. She has history of calcaneal bone spurs and has seen podiatry but no history of surgery to L foot/ankle. She saw [name] who encouraged her to restart diclofenac scheduled fpr 2 weeks and then dose as needed. She was also encouraged to use a lace up ASO brace which she has been wearing with improvement. She was noticing improvement in her symptoms until she stepped in a hole this past Saturday and  had a painful pop. She denies bruising or swelling  afterwards. She continued walking on her LLE and over the next 24 hours noted gradual improvement.  09/19/22: Left foot 3 views AP, lateral, lateral oblique: Dorsiflex ptosis present at the midfoot level over likely the second and third tarsometatarsal joints.  Slight pes cavus foot structure.  Osteoarthritic changes present at the second and third tarsometatarsal joints.  Posterior and plantar calcaneal heel spurs present.  Mild metatarsus adductus.   PAIN:    Pain Intensity: Present: 1/10, Best: 0/10, Worst: 10/10 Pain location: L lateral ankle posterior to lateral malleoli and into L foot.  Pain Quality: burning, sharp, and aching ("under ankle bone") Radiating: No  Swelling: No  Popping, catching, locking: Yes  Numbness/Tingling: No Focal Weakness: No but has a history of feeling chronic instability in L ankle; Aggravating factors: walking without brace, walking on uneven surfaces,  Relieving factors: ice, diclofenac, gabapentin, Tylenol; 24-hour pain behavior: worse at the end of the day; History of prior back, hip, knee, or ankle injury, pain, surgery, or therapy: No Dominant hand: right Imaging: Yes, see history; Typical footwear:  Red flags: Negative for personal history of cancer, chills/fever, night sweats, nausea, vomiting, unrelenting pain): Negative  PRECAUTIONS: None  WEIGHT BEARING RESTRICTIONS: No  FALLS: Has patient fallen in last 6 months? 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   Hobbies: Pottery (currently not limited but initially had trouble sitting at the wheel), gardening/yard work;  Patient Goals: walking  across the yard with improved confidence, lifting heavier objects at home without pain in hip, working the yard, "I would like to be able to get back to walking."   OBJECTIVE:   Patient Surveys  FOTO 38, predicted improvement to 42  Cognition Patient is oriented to person, place, and time.  Recent memory is intact.  Remote memory is intact.  Attention span and concentration are intact.  Expressive speech is intact.  Patient's fund of knowledge is within normal limits for educational level.    Gross Musculoskeletal Assessment Bulk: Normal Tone: Normal No trophic changes noted to foot/ankle. No ecchymosis, erythema, or edema noted.  Patient has a high resting arch with minimal arch collapse during gait  GAIT: Comments: No excessive pronation/supination noted during gait.  Minimal pronation noted.  Gait is antalgic with decreased step length on right lower extremity.  Posture: No gross deficits in the posture noted that are contributing to the left ankle pain.  AROM AROM (Normal range in degrees) AROM   Lumbar   Flexion (65) Moderate loss of segmental flexion  Extension (30) Mild loss  Right lateral flexion (25) Mild loss  Left lateral flexion (25) Mild loss  Right rotation (30) Mild loss  Left rotation (30) Mild loss   AROM (Normal range in degrees) AROM   Hip Right Left  Flexion (125)  WNL  Extension (15)    Abduction (40)  WNL  Adduction   WNL  Internal Rotation (45)  Mod/severe loss  External Rotation (45)  Mild loss      Knee    Flexion (135)  WNL (soft end feel)  Extension (0)  0      Ankle    Dorsiflexion (20) 18 18  Plantarflexion (50) 64 54  Inversion (  35) 24 22  Eversion (15) 16 14  (* = pain; Blank rows = not tested)  LE MMT: MMT (out of 5) Right  Left   Hip flexion 5 5  Hip extension    Hip abduction 28.0# (measured at last DC) 26.7# (measured at last DC)  Hip adduction    Hip internal rotation 5 5  Hip external rotation 5 5  Knee flexion 5  5  Knee extension 5 5  Ankle dorsiflexion 5 5*  Ankle plantarflexion 5 5*  Ankle inversion 5 5  Ankle eversion 5 5*  (* = pain; Blank rows = not tested)   Sensation Grossly intact to light touch throughout bilateral LEs as determined by testing dermatomes L2-S2. Proprioception, stereognosis, and hot/cold testing deferred on this date.  Reflexes Deferred  Muscle Length Deferred  Palpation Generally painful to palpation around anterior and left lateral ankle.  Pain with palpation along peroneal muscles/tendons, at the base of the fifth metatarsal, and in the arch of the foot following the tendon of peroneus longus.  Pain to palpation at the sinus tarsus as well as over the deltoid ligament.  Painful palpation along distal end of fibula.  Passive Accessory Motion Superior Tibiofibular Joint: WNL Inferior Tibiofibular Joint: WNL Talocrural Joint Distraction: WNL Talocrural Joint AP: Hypomobile Talocrural Joint PA: WNL  VASCULAR Dorsalis pedis and posterior tibial pulses are palpable  SPECIAL TESTS Ligamentous Integrity Anterior Drawer (ATF, 10-15 plantarflexion with anterior translation): Negative Talar Tilt (CFL, inversion): Negative Eversion Stress Test (Deltoid, eversion): Negative External Rotation Test (High ankle, dorsiflexion and external rotation): Negative Squeeze Test (High ankle): Negative Impingment Sign (Dorsiflexion and eversion): Negative  Achilles Integrity Thompson Test: Negative  Fracture Screening Metatarsal Axial Loading: Negative Tap/Percussion Test: Negative Vibration Test: Not examined  Pronation/Supination Navicular Drop: Negative  Nerve Test Tarsal Tunnel Test (maximal DF, EV, toe ext with tapping over tarsal tunnel): Not examined Test for Morton's Neuroma (compress metatarsals and mobilize): Not examined  Other Windlass Mechanism Test: Not examined   Beighton Scale: Deferred   TODAY'S TREATMENT:   SUBJECTIVE: Pt reports that she is  doing alright today. Pain was doing very well this morning but hurt more once she stood up. She continues wearing her lace-up ASO brace. HEP is going well. No specific questions currently.   PAIN: 1/10 L lateral ankle;   Trigger Point Dry Needling (TDN), unbilled Education previously performed with patient regarding potential benefit of TDN. Previously reviewed precautions and risks with patient. Pt provided verbal consent to treatment. Using clean technique TDN performed to L peroneus longus/brevis with 2, 0.25 x 40 single needle placements with local deep ache reported. Pistoning technique utilized.    Manual Therapy  Moist heat pack applied to L ankle at start of session during interval history; STM to left lower leg along peroneal muscles and tendons including posterior to the lateral malleolus, near the insertion of the peroneus brevis at the base of the fifth metatarsal, and along the transverse arch of the foot.  L ankle talocrural distraction, grade III, 30s/bout x 3 bouts; L ankle talocrural A/P mobilizations at available end range dorsiflexion, grade III, 30s/bout x 3 bouts; L ankle subtalar distraction, grade III, 30s/bout x 3 bouts; L ankle inversion and plantarflexion stretches 2 x 30s each;   Ther-ex  Eccentric L ankle eversion and dorsiflexion 2 x 10 each; Isotonic inversion and plantarflexion 2 x 10 each; HEP reviewed;   PATIENT EDUCATION:  Education details: Pt educated throughout session about proper posture and  technique with exercises. Improved exercise technique, movement at target joints, use of target muscles after min to mod verbal, visual, tactile cues; Person educated: Patient Education method: Explanation, Demonstration, Tactile cues, and Handouts Education comprehension: verbalized understanding and returned demonstration   HOME EXERCISE PROGRAM:  Access Code: [email or web address] URL: [email or web address] Date: 10/27/2022 Prepared by: Ria Comment  Exercises - Isometric Ankle Eversion at Wall  - 2 x daily - 7 x weekly - 1 sets - 10 reps - 10s on/10s off hold - Isometric Ankle Inversion at Wall (Mirrored)  - 2 x daily - 7 x weekly - 1 sets - 10 reps - 10s on/10s off hold - Long Sitting Calf Stretch with Strap  - 2 x daily - 7 x weekly - 3 reps - 30-45s hold   ASSESSMENT:  CLINICAL IMPRESSION: Progressed manual therapy and strengthening with patient during session today. Continued isometric L ankle dorsiflexion and eversion. Progressed to isotonic L ankle inversion and plantarflexion. Utilized trigger point dry needling as well as IASTM. Pt encouraged to continue her HEP and follow-up as scheduled. Pt will benefit from PT services to address deficits in range of motion and pain in order to return to full function at home, work, and when working in the yard with less pain.   OBJECTIVE IMPAIRMENTS: Abnormal gait, difficulty walking, decreased ROM, and pain.   ACTIVITY LIMITATIONS: squatting and stairs  PARTICIPATION LIMITATIONS: community activity, occupation, and yard work  PERSONAL FACTORS: Age, Past/current experiences, and 3+ comorbidities: DMII, lumbar radiculopathy, and bilateral knee OA  are also affecting patient's functional outcome.   REHAB POTENTIAL: Excellent  CLINICAL DECISION MAKING: Stable/uncomplicated  EVALUATION COMPLEXITY: Low   GOALS: Goals reviewed with patient? No  SHORT TERM GOALS: Target date: 11/21/2022  Pt will be independent with HEP to improve strength and decrease ankle pain to improve pain-free function at home and work. Baseline:  Goal status: INITIAL   LONG TERM GOALS: Target date: 12/19/2022  Pt will increase FOTO to at least 60 to demonstrate significant improvement in function at home and work related to ankle pain  Baseline: 10/24/22: 38 Goal status: INITIAL  2.  Pt will decrease worst ankle pain by at least 3 points on the NPRS in order to demonstrate clinically significant  reduction in ankle pain. Baseline: 10/24/22: worst: 10/10; Goal status: INITIAL  3.  Pt will report at least 75% improvement in symptoms in order demonstrate clinically significant reduction in ankle pain/disability.       Baseline:  Goal status: INITIAL  4.  Pt will be able to bend, squat, and walk across an uneven yard without an increase in her L ankle pain in order to resume working in her yard/garden as well as return to walking for exercise with less pain Baseline: 10/24/22: Notable painful in L ankle Goal status: INITIAL   PLAN: PT FREQUENCY: 1-2x/week  PT DURATION: 8 weeks  PLANNED INTERVENTIONS: Therapeutic exercises, Therapeutic activity, Neuromuscular re-education, Balance training, Gait training, Patient/Family education, Self Care, Joint mobilization, Joint manipulation, Vestibular training, Canalith repositioning, Orthotic/Fit training, DME instructions, Dry Needling, Electrical stimulation, Spinal manipulation, Spinal mobilization, Cryotherapy, Moist heat, Taping, Traction, Ultrasound, Ionotophoresis 4mg /ml Dexamethasone, Manual therapy, and Re-evaluation.  PLAN FOR NEXT SESSION: progress strengthening as tolerated, manual therapy interventions to decrease pain and improve range of motion, review/modify HEP as needed.   Sharalyn Ink [place]als PT, DPT, GCS  Shayle Donahoo, PT 10/31/2022, 10:17 PM

## 2022-11-01 ENCOUNTER — Encounter: Payer: Self-pay | Admitting: Internal Medicine

## 2022-11-03 ENCOUNTER — Ambulatory Visit: Payer: Medicare Other

## 2022-11-03 DIAGNOSIS — M25572 Pain in left ankle and joints of left foot: Secondary | ICD-10-CM

## 2022-11-03 DIAGNOSIS — M25562 Pain in left knee: Secondary | ICD-10-CM | POA: Diagnosis not present

## 2022-11-03 DIAGNOSIS — M7672 Peroneal tendinitis, left leg: Secondary | ICD-10-CM | POA: Diagnosis not present

## 2022-11-03 DIAGNOSIS — G8929 Other chronic pain: Secondary | ICD-10-CM

## 2022-11-03 DIAGNOSIS — M25551 Pain in right hip: Secondary | ICD-10-CM | POA: Diagnosis not present

## 2022-11-03 NOTE — Therapy (Signed)
OUTPATIENT PHYSICAL THERAPY ANKLE TREATMENT  Patient Name: Brandy [name] MRN: [id number] DOB:Apr 21, 1956, 67 y.o., female Today's Date: 11/03/2022  END OF SESSION:  PT End of Session - 11/03/22 0854     Visit Number 4    Number of Visits 17    Date for PT Re-Evaluation 12/19/22    Authorization Type eval: 10/24/22, BC[employer] Medicare 2024  [id number]  [place]  no auth req    PT Start Time 0845    PT Stop Time 0930    PT Time Calculation (min) 45 min    Activity Tolerance Patient tolerated treatment well    Behavior During Therapy [facility] for tasks assessed/performed             Past Medical History:  Diagnosis Date   Acute lower GI bleeding 01/19/2020   Arthritis    Atrial fibrillation with rapid ventricular response (HCC)    Chicken pox    Chronic systolic CHF (congestive heart failure) (HCC)    a. 03/2016 Echo: Ef 15-20%, diff HK, ant AK;  b. 05/2016 Echo: Ef 30-35%, diff HK, mildly dil LA/RA.   Diabetes mellitus without complication (HCC)    Dysrhythmia    GERD (gastroesophageal reflux disease)    Heart murmur    Hip discomfort    been going on for 40 years    History of kidney stones    Hyperlipidemia associated with type 2 diabetes mellitus (HCC) 05/12/2021   Hypertension    Moderate mitral regurgitation    a. 03/2016 Echo: mod MR in setting of LV dysfxn.   Motion sickness    car - back seat   NICM (nonischemic cardiomyopathy) (HCC)    a. 03/2016 Echo: EF 15-20%, diff HK, ant AK, mod MR, mod dil LA, mildly dil RA;  b. 04/2016 Cath: nl cors;  c. 05/2016 Echo: EF 30-35%, diff HK.   Obesity    Obstructive sleep apnea    compliant with CPAP   Persistent atrial fibrillation (HCC)    a. Dx 03/2016;  b. CHA2DS2VASc = 2-->Eliquis 5mg  BID;  b. 05/2016 Failed DCCV x 4.   Sleep apnea    Stomach irritation    Visit for monitoring Tikosyn therapy 07/24/2016   Past Surgical History:  Procedure Laterality Date   ATRIAL FIBRILLATION ABLATION N/A 12/09/2020   Procedure: ATRIAL  FIBRILLATION ABLATION;  Surgeon: Lanier Prude, MD;  Location: [facility];  Service: Cardiovascular;  Laterality: N/A;   BIOPSY N/A 01/12/2020   Procedure: BIOPSY;  Surgeon: Pasty Spillers, MD;  Location: [facility];  Service: Endoscopy;  Laterality: N/A;   CARDIAC CATHETERIZATION N/A 04/17/2016   Procedure: Left Heart Cath and Coronary Angiography;  Surgeon: Iran Ouch, MD;  Location: [facility] INVASIVE CV LAB;  Service: Cardiovascular;  Laterality: N/A;   CARDIOVERSION N/A 10/22/2020   Procedure: CARDIOVERSION;  Surgeon: Yvonne Kendall, MD;  Location: [facility] ORS;  Service: Cardiovascular;  Laterality: N/A;   CHOLECYSTECTOMY     COLON SURGERY  2021   COLONOSCOPY N/A 01/20/2020   Procedure: COLONOSCOPY;  Surgeon: Regis Bill, MD;  Location: [facility] ENDOSCOPY;  Service: Endoscopy;  Laterality: N/A;   COLONOSCOPY WITH PROPOFOL N/A 01/12/2020   Procedure: COLONOSCOPY WITH PROPOFOL;  Surgeon: Pasty Spillers, MD;  Location: [facility];  Service: Endoscopy;  Laterality: N/A;  Diabetic - oral meds sleep apnea   ELECTROPHYSIOLOGIC STUDY N/A 05/26/2016   Procedure: Cardioversion;  Surgeon: Iran Ouch, MD;  Location: [facility] ORS;  Service:  Cardiovascular;  Laterality: N/A;   ESOPHAGOGASTRODUODENOSCOPY (EGD) WITH PROPOFOL N/A 01/12/2020   Procedure: ESOPHAGOGASTRODUODENOSCOPY (EGD) WITH PROPOFOL;  Surgeon: Pasty Spillers, MD;  Location: [facility];  Service: Endoscopy;  Laterality: N/A;   POLYPECTOMY N/A 01/12/2020   Procedure: POLYPECTOMY;  Surgeon: Pasty Spillers, MD;  Location: [facility];  Service: Endoscopy;  Laterality: N/A;   Patient Active Problem List   Diagnosis Date Noted   Peroneal tendinitis, left 10/13/2022   Left lumbar radiculopathy 07/26/2022   Arthralgia of left knee 07/26/2022   Pes anserinus bursitis of right knee 04/19/2022   Primary osteoarthritis of right knee 03/27/2022   Greater trochanteric pain  syndrome of right lower extremity 03/27/2022   It band syndrome, right 03/27/2022   Hyperlipidemia associated with type 2 diabetes mellitus (HCC) 05/12/2021   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   Varicose veins of leg with pain, bilateral 07/06/2020   Acquired thrombophilia (HCC) 05/24/2020   Type II diabetes mellitus with complication (HCC) 01/19/2020   Gastric polyp    Colon polyps    Varicose veins of both lower extremities 10/20/2019   Dysphagia 10/20/2019   Osteoarthritis of right hand 10/31/2016   Nonischemic cardiomyopathy (HCC) 04/14/2016   BMI 33.0-33.9,adult 04/14/2016    PCP: Bari Edward MD  REFERRING PROVIDER: Joseph Berkshire MD  REFERRING DIAG: [phone] (ICD-10-CM) - Peroneal tendinitis, left   Rationale for Evaluation and Treatment: Rehabilitation  THERAPY DIAG: Pain in left ankle and joints of left foot  Chronic pain of left knee  ONSET DATE: 09/26/22 (approximate)  FOLLOW-UP APPT SCHEDULED WITH REFERRING PROVIDER: No, but sees PCP 12/20/22  FROM INITIAL EVALUATION SUBJECTIVE:                                                                                                                                                                                         SUBJECTIVE STATEMENT:  L lateral ankle pain.   PERTINENT HISTORY:  Pt reports onset of L lateral ankle and base of 5th metatarsal pain after exercising in mid May 2024. No trauma reported however pt states that she had started performing cable resisted hip abduction as well as walking with weights. She has history of calcaneal bone spurs and has seen podiatry but no history of surgery to L foot/ankle. She saw [name] who encouraged her to restart diclofenac scheduled fpr 2 weeks and then dose as needed. She was also encouraged to use a lace up ASO brace which she has been wearing with improvement. She was noticing improvement in her symptoms until she stepped in a hole this past Saturday and  had a painful pop. She denies bruising  or swelling afterwards. She continued walking on her LLE and over the next 24 hours noted gradual improvement.  09/19/22: Left foot 3 views AP, lateral, lateral oblique: Dorsiflex ptosis present at the midfoot level over likely the second and third tarsometatarsal joints.  Slight pes cavus foot structure.  Osteoarthritic changes present at the second and third tarsometatarsal joints.  Posterior and plantar calcaneal heel spurs present.  Mild metatarsus adductus.   PAIN:    Pain Intensity: Present: 1/10, Best: 0/10, Worst: 10/10 Pain location: L lateral ankle posterior to lateral malleoli and into L foot.  Pain Quality: burning, sharp, and aching ("under ankle bone") Radiating: No  Swelling: No  Popping, catching, locking: Yes  Numbness/Tingling: No Focal Weakness: No but has a history of feeling chronic instability in L ankle; Aggravating factors: walking without brace, walking on uneven surfaces,  Relieving factors: ice, diclofenac, gabapentin, Tylenol; 24-hour pain behavior: worse at the end of the day; History of prior back, hip, knee, or ankle injury, pain, surgery, or therapy: No Dominant hand: right Imaging: Yes, see history; Typical footwear:  Red flags: Negative for personal history of cancer, chills/fever, night sweats, nausea, vomiting, unrelenting pain): Negative  PRECAUTIONS: None  WEIGHT BEARING RESTRICTIONS: No  FALLS: Has patient fallen in last 6 months? 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   Hobbies: Pottery (currently not limited but initially had trouble sitting at the wheel), gardening/yard work;  Patient Goals: walking  across the yard with improved confidence, lifting heavier objects at home without pain in hip, working the yard, "I would like to be able to get back to walking."   OBJECTIVE:   Patient Surveys  FOTO 38, predicted improvement to 53  Cognition Patient is oriented to person, place, and time.  Recent memory is intact.  Remote memory is intact.  Attention span and concentration are intact.  Expressive speech is intact.  Patient's fund of knowledge is within normal limits for educational level.    Gross Musculoskeletal Assessment Bulk: Normal Tone: Normal No trophic changes noted to foot/ankle. No ecchymosis, erythema, or edema noted.  Patient has a high resting arch with minimal arch collapse during gait  GAIT: Comments: No excessive pronation/supination noted during gait.  Minimal pronation noted.  Gait is antalgic with decreased step length on right lower extremity.  Posture: No gross deficits in the posture noted that are contributing to the left ankle pain.  AROM AROM (Normal range in degrees) AROM   Lumbar   Flexion (65) Moderate loss of segmental flexion  Extension (30) Mild loss  Right lateral flexion (25) Mild loss  Left lateral flexion (25) Mild loss  Right rotation (30) Mild loss  Left rotation (30) Mild loss   AROM (Normal range in degrees) AROM   Hip Right Left  Flexion (125)  WNL  Extension (15)    Abduction (40)  WNL  Adduction   WNL  Internal Rotation (45)  Mod/severe loss  External Rotation (45)  Mild loss      Knee    Flexion (135)  WNL (soft end feel)  Extension (0)  0      Ankle    Dorsiflexion (20) 18 18  Plantarflexion (50) 64 54  Inversion (35) 24 22  Eversion (15) 16 14  (* = pain; Blank rows = not tested)  LE MMT: MMT (out of 5) Right  Left   Hip flexion 5 5  Hip extension    Hip abduction 28.0# (measured at last DC) 26.7# (measured at last DC)  Hip adduction    Hip internal rotation 5 5  Hip external rotation 5 5  Knee flexion 5  5  Knee extension 5 5  Ankle dorsiflexion 5 5*  Ankle plantarflexion 5 5*  Ankle inversion 5 5  Ankle eversion 5 5*  (* = pain; Blank rows = not tested)   Sensation Grossly intact to light touch throughout bilateral LEs as determined by testing dermatomes L2-S2. Proprioception, stereognosis, and hot/cold testing deferred on this date.  Reflexes Deferred  Muscle Length Deferred  Palpation Generally painful to palpation around anterior and left lateral ankle.  Pain with palpation along peroneal muscles/tendons, at the base of the fifth metatarsal, and in the arch of the foot following the tendon of peroneus longus.  Pain to palpation at the sinus tarsus as well as over the deltoid ligament.  Painful palpation along distal end of fibula.  Passive Accessory Motion Superior Tibiofibular Joint: WNL Inferior Tibiofibular Joint: WNL Talocrural Joint Distraction: WNL Talocrural Joint AP: Hypomobile Talocrural Joint PA: WNL  VASCULAR Dorsalis pedis and posterior tibial pulses are palpable  SPECIAL TESTS Ligamentous Integrity Anterior Drawer (ATF, 10-15 plantarflexion with anterior translation): Negative Talar Tilt (CFL, inversion): Negative Eversion Stress Test (Deltoid, eversion): Negative External Rotation Test (High ankle, dorsiflexion and external rotation): Negative Squeeze Test (High ankle): Negative Impingment Sign (Dorsiflexion and eversion): Negative  Achilles Integrity Thompson Test: Negative  Fracture Screening Metatarsal Axial Loading: Negative Tap/Percussion Test: Negative Vibration Test: Not examined  Pronation/Supination Navicular Drop: Negative  Nerve Test Tarsal Tunnel Test (maximal DF, EV, toe ext with tapping over tarsal tunnel): Not examined Test for Morton's Neuroma (compress metatarsals and mobilize): Not examined  Other Windlass Mechanism Test: Not examined   Beighton Scale: Deferred   TODAY'S TREATMENT:   SUBJECTIVE: Pt reports that she is  doing well today. Her ankle pain is improving considerably. She continues wearing her lace-up ASO brace. HEP is going well. No specific questions currently.   PAIN: 0.5/10 L lateral ankle;   Trigger Point Dry Needling (TDN), unbilled Education previously performed with patient regarding potential benefit of TDN. Previously reviewed precautions and risks with patient. Pt provided verbal consent to treatment. Using clean technique TDN performed to L peroneus longus/brevis with 3, 0.25 x 40 single needle placements with local deep ache reported. Pistoning technique utilized.    Manual Therapy  Moist heat pack applied to L ankle at start of session during interval history; STM to left lower leg along peroneal muscles and tendons including posterior to the lateral malleolus, near the insertion of the peroneus brevis at the base of the fifth metatarsal, and along the transverse arch of the foot.  L ankle talocrural distraction, grade III, 30s/bout x 3 bouts; L ankle talocrural A/P mobilizations at available end range dorsiflexion, grade III, 30s/bout x 3 bouts; L ankle subtalar distraction, grade III, 30s/bout x 3 bouts; L ankle inversion and plantarflexion stretches 2 x 30s each;   Ther-ex  Isotonic eversion, inversion, dorsiflexion and plantarflexion 2 x 10 each;   PATIENT EDUCATION:  Education details: Pt educated throughout session about proper posture and technique with exercises. Improved exercise technique, movement at target joints, use of target muscles after min to  mod verbal, visual, tactile cues; HEP progressions; Person educated: Patient Education method: Explanation, Demonstration, Tactile cues, and Handouts Education comprehension: verbalized understanding and returned demonstration   HOME EXERCISE PROGRAM:  Access Code: [email or web address] URL: [email or web address] Date: 10/27/2022 Prepared by: Ria Comment  Exercises - Isometric Ankle Eversion at Wall  - 2 x daily  - 7 x weekly - 1 sets - 10 reps - 10s on/10s off hold - Isometric Ankle Inversion at Wall (Mirrored)  - 2 x daily - 7 x weekly - 1 sets - 10 reps - 10s on/10s off hold - Long Sitting Calf Stretch with Strap  - 2 x daily - 7 x weekly - 3 reps - 30-45s hold   ASSESSMENT:  CLINICAL IMPRESSION: Progressed manual therapy and strengthening with patient during session today. Continued isotonic L ankle inversion and plantarflexion and pt advised she can progress at home as tolerated. Utilized trigger point dry needling as well as IASTM given it's helpfulness in the past. Pt encouraged to continue her HEP and follow-up as scheduled. Pt will benefit from PT services to address deficits in range of motion and pain in order to return to full function at home, work, and when working in the yard with less pain.   OBJECTIVE IMPAIRMENTS: Abnormal gait, difficulty walking, decreased ROM, and pain.   ACTIVITY LIMITATIONS: squatting and stairs  PARTICIPATION LIMITATIONS: community activity, occupation, and yard work  PERSONAL FACTORS: Age, Past/current experiences, and 3+ comorbidities: DMII, lumbar radiculopathy, and bilateral knee OA  are also affecting patient's functional outcome.   REHAB POTENTIAL: Excellent  CLINICAL DECISION MAKING: Stable/uncomplicated  EVALUATION COMPLEXITY: Low   GOALS: Goals reviewed with patient? No  SHORT TERM GOALS: Target date: 11/21/2022  Pt will be independent with HEP to improve strength and decrease ankle pain to improve pain-free function at home and work. Baseline:  Goal status: INITIAL   LONG TERM GOALS: Target date: 12/19/2022  Pt will increase FOTO to at least 60 to demonstrate significant improvement in function at home and work related to ankle pain  Baseline: 10/24/22: 38 Goal status: INITIAL  2.  Pt will decrease worst ankle pain by at least 3 points on the NPRS in order to demonstrate clinically significant reduction in ankle pain. Baseline: 10/24/22:  worst: 10/10; Goal status: INITIAL  3.  Pt will report at least 75% improvement in symptoms in order demonstrate clinically significant reduction in ankle pain/disability.       Baseline:  Goal status: INITIAL  4.  Pt will be able to bend, squat, and walk across an uneven yard without an increase in her L ankle pain in order to resume working in her yard/garden as well as return to walking for exercise with less pain Baseline: 10/24/22: Notable painful in L ankle Goal status: INITIAL   PLAN: PT FREQUENCY: 1-2x/week  PT DURATION: 8 weeks  PLANNED INTERVENTIONS: Therapeutic exercises, Therapeutic activity, Neuromuscular re-education, Balance training, Gait training, Patient/Family education, Self Care, Joint mobilization, Joint manipulation, Vestibular training, Canalith repositioning, Orthotic/Fit training, DME instructions, Dry Needling, Electrical stimulation, Spinal manipulation, Spinal mobilization, Cryotherapy, Moist heat, Taping, Traction, Ultrasound, Ionotophoresis 4mg /ml Dexamethasone, Manual therapy, and Re-evaluation.  PLAN FOR NEXT SESSION: progress strengthening as tolerated, manual therapy interventions to decrease pain and improve range of motion, review/modify HEP as needed.   Sharalyn Ink Marina Boerner PT, DPT, GCS  Andrej Spagnoli, PT 11/03/2022, 11:47 AM

## 2022-11-03 NOTE — Progress Notes (Signed)
Care[facility] Summary Report / Loop Recorder 

## 2022-11-07 ENCOUNTER — Ambulatory Visit: Payer: Medicare Other

## 2022-11-07 DIAGNOSIS — M7672 Peroneal tendinitis, left leg: Secondary | ICD-10-CM | POA: Diagnosis not present

## 2022-11-07 DIAGNOSIS — M25572 Pain in left ankle and joints of left foot: Secondary | ICD-10-CM

## 2022-11-07 DIAGNOSIS — M25562 Pain in left knee: Secondary | ICD-10-CM | POA: Diagnosis not present

## 2022-11-07 DIAGNOSIS — G8929 Other chronic pain: Secondary | ICD-10-CM

## 2022-11-07 DIAGNOSIS — M25551 Pain in right hip: Secondary | ICD-10-CM | POA: Diagnosis not present

## 2022-11-07 LAB — CUP PACEART REMOTE DEVICE CHECK
Date Time Interrogation Session: 20240624230736
Implantable Pulse Generator Implant Date: 20230928

## 2022-11-07 NOTE — Therapy (Signed)
OUTPATIENT PHYSICAL THERAPY ANKLE TREATMENT  Patient Name: Brandy [name] MRN: [id number] DOB:09-10-1955, 67 y.o., female Today's Date: 11/07/2022  END OF SESSION:  PT End of Session - 11/07/22 1843    PT Start Time 0932    PT Stop Time 1015    PT Time Calculation (min) 43 min         PT VISIT # 5          Past Medical History:  Diagnosis Date   Acute lower GI bleeding 01/19/2020   Arthritis    Atrial fibrillation with rapid ventricular response (HCC)    Chicken pox    Chronic systolic CHF (congestive heart failure) (HCC)    a. 03/2016 Echo: Ef 15-20%, diff HK, ant AK;  b. 05/2016 Echo: Ef 30-35%, diff HK, mildly dil LA/RA.   Diabetes mellitus without complication (HCC)    Dysrhythmia    GERD (gastroesophageal reflux disease)    Heart murmur    Hip discomfort    been going on for 40 years    History of kidney stones    Hyperlipidemia associated with type 2 diabetes mellitus (HCC) 05/12/2021   Hypertension    Moderate mitral regurgitation    a. 03/2016 Echo: mod MR in setting of LV dysfxn.   Motion sickness    car - back seat   NICM (nonischemic cardiomyopathy) (HCC)    a. 03/2016 Echo: EF 15-20%, diff HK, ant AK, mod MR, mod dil LA, mildly dil RA;  b. 04/2016 Cath: nl cors;  c. 05/2016 Echo: EF 30-35%, diff HK.   Obesity    Obstructive sleep apnea    compliant with CPAP   Persistent atrial fibrillation (HCC)    a. Dx 03/2016;  b. CHA2DS2VASc = 2-->Eliquis 5mg  BID;  b. 05/2016 Failed DCCV x 4.   Sleep apnea    Stomach irritation    Visit for monitoring Tikosyn therapy 07/24/2016   Past Surgical History:  Procedure Laterality Date   ATRIAL FIBRILLATION ABLATION N/A 12/09/2020   Procedure: ATRIAL FIBRILLATION ABLATION;  Surgeon: Lanier Prude, MD;  Location: [facility];  Service: Cardiovascular;  Laterality: N/A;   BIOPSY N/A 01/12/2020   Procedure: BIOPSY;  Surgeon: Pasty Spillers, MD;  Location: [facility];  Service: Endoscopy;  Laterality:  N/A;   CARDIAC CATHETERIZATION N/A 04/17/2016   Procedure: Left Heart Cath and Coronary Angiography;  Surgeon: Iran Ouch, MD;  Location: [facility];  Service: Cardiovascular;  Laterality: N/A;   CARDIOVERSION N/A 10/22/2020   Procedure: CARDIOVERSION;  Surgeon: Yvonne Kendall, MD;  Location: [facility];  Service: Cardiovascular;  Laterality: N/A;   CHOLECYSTECTOMY     COLON SURGERY  2021   COLONOSCOPY N/A 01/20/2020   Procedure: COLONOSCOPY;  Surgeon: Regis Bill, MD;  Location: [facility];  Service: Endoscopy;  Laterality: N/A;   COLONOSCOPY WITH PROPOFOL N/A 01/12/2020   Procedure: COLONOSCOPY WITH PROPOFOL;  Surgeon: Pasty Spillers, MD;  Location: [facility];  Service: Endoscopy;  Laterality: N/A;  Diabetic - oral meds sleep apnea   ELECTROPHYSIOLOGIC STUDY N/A 05/26/2016   Procedure: Cardioversion;  Surgeon: Iran Ouch, MD;  Location: [facility];  Service: Cardiovascular;  Laterality: N/A;   ESOPHAGOGASTRODUODENOSCOPY (EGD) WITH PROPOFOL N/A 01/12/2020   Procedure: ESOPHAGOGASTRODUODENOSCOPY (EGD) WITH PROPOFOL;  Surgeon: Pasty Spillers, MD;  Location: [facility];  Service: Endoscopy;  Laterality: N/A;   POLYPECTOMY N/A 01/12/2020   Procedure: POLYPECTOMY;  Surgeon: Pasty Spillers, MD;  Location:  [facility];  Service: Endoscopy;  Laterality: N/A;   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   Varicose veins of leg with pain, bilateral 07/06/2020   Acquired  thrombophilia (HCC) 05/24/2020   Type II diabetes mellitus with complication (HCC) 01/19/2020   Gastric polyp    Colon polyps    Varicose veins of both lower extremities 10/20/2019   Dysphagia 10/20/2019   Osteoarthritis of right hand 10/31/2016   Nonischemic cardiomyopathy (HCC) 04/14/2016   BMI 33.0-33.9,adult 04/14/2016    PCP: Bari Edward MD  REFERRING PROVIDER: Joseph Berkshire MD  REFERRING DIAG: [phone] (ICD-10-CM) - Peroneal tendinitis, left   Rationale for Evaluation and Treatment: Rehabilitation  THERAPY DIAG: Pain in left ankle and joints of left foot  Chronic pain of left knee  ONSET DATE: 09/26/22 (approximate)  FOLLOW-UP APPT SCHEDULED WITH REFERRING PROVIDER: No, but sees PCP 12/20/22  FROM INITIAL EVALUATION SUBJECTIVE:                                                                                                                                                                                         SUBJECTIVE STATEMENT:  L lateral ankle pain.   PERTINENT HISTORY:  Pt reports onset of L lateral ankle and base of 5th metatarsal pain after exercising in mid May 2024. No trauma reported however pt states that she had started performing cable resisted hip abduction as well as walking with weights. She has history of calcaneal bone spurs and has seen podiatry but no history of surgery to L foot/ankle. She saw [name] who encouraged her to restart diclofenac scheduled fpr 2 weeks and then dose as needed. She was also encouraged to use a lace up ASO brace which she has been wearing with improvement. She was noticing improvement in her symptoms until she stepped in a hole this past Saturday and had a painful pop. She denies bruising or swelling afterwards. She continued walking on her LLE and over the next 24 hours noted gradual improvement.  09/19/22: Left foot 3 views AP, lateral, lateral oblique: Dorsiflex ptosis present at the midfoot level over likely the second and  third tarsometatarsal joints.  Slight pes cavus foot structure.  Osteoarthritic changes present  at the second and third tarsometatarsal joints.  Posterior and plantar calcaneal heel spurs present.  Mild metatarsus adductus.   PAIN:    Pain Intensity: Present: 1/10, Best: 0/10, Worst: 10/10 Pain location: L lateral ankle posterior to lateral malleoli and into L foot.  Pain Quality: burning, sharp, and aching ("under ankle bone") Radiating: No  Swelling: No  Popping, catching, locking: Yes  Numbness/Tingling: No Focal Weakness: No but has a history of feeling chronic instability in L ankle; Aggravating factors: walking without brace, walking on uneven surfaces,  Relieving factors: ice, diclofenac, gabapentin, Tylenol; 24-hour pain behavior: worse at the end of the day; History of prior back, hip, knee, or ankle injury, pain, surgery, or therapy: No Dominant hand: right Imaging: Yes, see history; Typical footwear:  Red flags: Negative for personal history of cancer, chills/fever, night sweats, nausea, vomiting, unrelenting pain): Negative  PRECAUTIONS: None  WEIGHT BEARING RESTRICTIONS: No  FALLS: Has patient fallen in last 6 months? 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   Hobbies: Pottery (currently not limited but initially had trouble sitting at the wheel), gardening/yard work;  Patient Goals: walking across the yard with improved confidence, lifting heavier objects at home without pain in hip, working the yard, "I would like to be able to get back to walking."   OBJECTIVE:   Patient Surveys  FOTO 38, predicted improvement to 37  Cognition Patient is oriented to person,  place, and time.  Recent memory is intact.  Remote memory is intact.  Attention span and concentration are intact.  Expressive speech is intact.  Patient's fund of knowledge is within normal limits for educational level.    Gross Musculoskeletal Assessment Bulk: Normal Tone: Normal No trophic changes noted to foot/ankle. No ecchymosis, erythema, or edema noted.  Patient has a high resting arch with minimal arch collapse during gait  GAIT: Comments: No excessive pronation/supination noted during gait.  Minimal pronation noted.  Gait is antalgic with decreased step length on right lower extremity.  Posture: No gross deficits in the posture noted that are contributing to the left ankle pain.  AROM AROM (Normal range in degrees) AROM   Lumbar   Flexion (65) Moderate loss of segmental flexion  Extension (30) Mild loss  Right lateral flexion (25) Mild loss  Left lateral flexion (25) Mild loss  Right rotation (30) Mild loss  Left rotation (30) Mild loss   AROM (Normal range in degrees) AROM   Hip Right Left  Flexion (125)  WNL  Extension (15)    Abduction (40)  WNL  Adduction   WNL  Internal Rotation (45)  Mod/severe loss  External Rotation (45)  Mild loss      Knee    Flexion (135)  WNL (soft end feel)  Extension (0)  0      Ankle    Dorsiflexion (20) 18 18  Plantarflexion (50) 64 54  Inversion (35) 24 22  Eversion (15) 16 14  (* = pain; Blank rows = not tested)  LE MMT: MMT (out of 5) Right  Left   Hip flexion 5 5  Hip extension    Hip abduction 28.0# (measured at last DC) 26.7# (measured at last DC)  Hip adduction    Hip internal rotation 5 5  Hip external rotation 5 5  Knee flexion 5 5  Knee extension 5 5  Ankle dorsiflexion 5 5*  Ankle plantarflexion 5 5*  Ankle inversion 5 5  Ankle eversion 5 5*  (* = pain; Blank rows = not tested)   Sensation Grossly intact to light touch throughout bilateral LEs as determined by testing dermatomes L2-S2.  Proprioception, stereognosis, and hot/cold testing deferred on this date.  Reflexes Deferred  Muscle Length Deferred  Palpation Generally painful to palpation around anterior and left lateral ankle.  Pain with palpation along peroneal muscles/tendons, at the base of the fifth metatarsal, and in the arch of the foot following the tendon of peroneus longus.  Pain to palpation at the sinus tarsus as well as over the deltoid ligament.  Painful palpation along distal end of fibula.  Passive Accessory Motion Superior Tibiofibular Joint: WNL Inferior Tibiofibular Joint: WNL Talocrural Joint Distraction: WNL Talocrural Joint AP: Hypomobile Talocrural Joint PA: WNL  VASCULAR Dorsalis pedis and posterior tibial pulses are palpable  SPECIAL TESTS Ligamentous Integrity Anterior Drawer (ATF, 10-15 plantarflexion with anterior translation): Negative Talar Tilt (CFL, inversion): Negative Eversion Stress Test (Deltoid, eversion): Negative External Rotation Test (High ankle, dorsiflexion and external rotation): Negative Squeeze Test (High ankle): Negative Impingment Sign (Dorsiflexion and eversion): Negative  Achilles Integrity Thompson Test: Negative  Fracture Screening Metatarsal Axial Loading: Negative Tap/Percussion Test: Negative Vibration Test: Not examined  Pronation/Supination Navicular Drop: Negative  Nerve Test Tarsal Tunnel Test (maximal DF, EV, toe ext with tapping over tarsal tunnel): Not examined Test for Morton's Neuroma (compress metatarsals and mobilize): Not examined  Other Windlass Mechanism Test: Not examined   Beighton Scale: Deferred   TODAY'S TREATMENT:   SUBJECTIVE: Pt reports that she is doing well today. Her ankle pain is improving considerably. She continues wearing her lace-up ASO brace. HEP is going well. No specific questions currently.   PAIN: 0/10 L lateral ankle; pain 1/10 with balance exs without brace.  TE: 20 mins Nu-Step 5 mins at level  3 Calf and soleus stretch using towel 60 secs each Ankle PREs in all palnes using GTB x 60 secs each STS 2 x 60 secs  NM: 10 min Standing balance activities included: Standing on foam, tandem walking, step ups to and form foam, standing with eyes close and with EO on foam.   Manual Therapy: 13 STM to left lower leg along peroneal muscles and tendons including posterior to the lateral malleolus, near the insertion of the peroneus brevis at the base of the fifth metatarsal, and along the transverse arch of the foot.  L ankle talocrural distraction, grade III, 30s/bout x 3 bouts; L ankle talocrural A/P mobilizations at available end range dorsiflexion, grade III, 30s/bout x 3 bouts; L ankle subtalar distraction, grade III, 30s/bout x 3 bouts; L ankle inversion and plantarflexion stretches 2 x 30s each;  Not performed: Ther-ex  Isotonic eversion, inversion, dorsiflexion and plantarflexion 2 x 10 each; Trigger Point Dry Needling (TDN), unbilled Education previously performed with patient regarding potential benefit of TDN. Previously reviewed precautions and risks with patient. Pt provided verbal consent to treatment. Using clean technique TDN performed to L peroneus longus/brevis with 3, 0.25 x 40 single needle placements with local deep ache reported. Pistoning technique utilized.   PATIENT EDUCATION:  Education details: Pt educated throughout session about proper posture and technique with exercises. Improved exercise technique, movement at target joints, use of target muscles after min  to mod verbal, visual, tactile cues; HEP progressions; Person educated: Patient Education method: Explanation, Demonstration, Tactile cues, and Handouts Education comprehension: verbalized understanding and returned demonstration   HOME EXERCISE PROGRAM:  Access Code: [email or web address] URL: [email or web address] Date: 11/07/2022 Prepared by: Janet Berlin  Exercises - Seated Ankle Eversion with  Resistance  - 1 x daily - 7 x weekly - 3 sets - 10 reps - Seated Ankle Inversion with Resistance and Legs Crossed  - 1 x daily - 7 x weekly - 3 sets - 10 reps - Gastroc Stretch on Wall  - 1 x daily - 7 x weekly - 3 sets - 10 reps - Soleus Stretch on Wall  - 1 x daily - 7 x weekly - 3 sets - 10 reps - Tibialis Posterior Stretch at Wall  - 1 x daily - 7 x weekly - 3 sets - 10 reps   ASSESSMENT:  CLINICAL IMPRESSION: PT provided jt mobs to improve DF and reduce PF tightness. Pt demonstrates tight post tibialis. Pt introduced to stretches and advanced pt with strengthening and balance exs. Pt will Continue with isotonic L ankle inversion and plantarflexion and pt advised she can progress at home as tolerated. Pt provided with updated HEP via my chart. Pt encouraged to slowly d/c the brace indoors and to wear when outdoors. Pt agreed and demonstrated good understanding. continue her HEP and follow-up as scheduled. Pt will benefit from PT services to address deficits in range of motion and pain in order to return to full function at home, work, and when working in the yard with less pain.   OBJECTIVE IMPAIRMENTS: Abnormal gait, difficulty walking, decreased ROM, and pain.   ACTIVITY LIMITATIONS: squatting and stairs  PARTICIPATION LIMITATIONS: community activity, occupation, and yard work  PERSONAL FACTORS: Age, Past/current experiences, and 3+ comorbidities: DMII, lumbar radiculopathy, and bilateral knee OA  are also affecting patient's functional outcome.   REHAB POTENTIAL: Excellent  CLINICAL DECISION MAKING: Stable/uncomplicated  EVALUATION COMPLEXITY: Low   GOALS: Goals reviewed with patient? No  SHORT TERM GOALS: Target date: 11/21/2022  Pt will be independent with HEP to improve strength and decrease ankle pain to improve pain-free function at home and work. Baseline:  Goal status: INITIAL   LONG TERM GOALS: Target date: 12/19/2022  Pt will increase FOTO to at least 60 to  demonstrate significant improvement in function at home and work related to ankle pain  Baseline: 10/24/22: 38 Goal status: INITIAL  2.  Pt will decrease worst ankle pain by at least 3 points on the NPRS in order to demonstrate clinically significant reduction in ankle pain. Baseline: 10/24/22: worst: 10/10; Goal status: INITIAL  3.  Pt will report at least 75% improvement in symptoms in order demonstrate clinically significant reduction in ankle pain/disability.       Baseline:  Goal status: INITIAL  4.  Pt will be able to bend, squat, and walk across an uneven yard without an increase in her L ankle pain in order to resume working in her yard/garden as well as return to walking for exercise with less pain Baseline: 10/24/22: Notable painful in L ankle Goal status: INITIAL   PLAN: PT FREQUENCY: 1-2x/week  PT DURATION: 8 weeks  PLANNED INTERVENTIONS: Therapeutic exercises, Therapeutic activity, Neuromuscular re-education, Balance training, Gait training, Patient/Family education, Self Care, Joint mobilization, Joint manipulation, Vestibular training, Canalith repositioning, Orthotic/Fit training, DME instructions, Dry Needling, Electrical stimulation, Spinal manipulation, Spinal mobilization, Cryotherapy, Moist heat, Taping, Traction, Ultrasound, Ionotophoresis 4mg /ml Dexamethasone, Manual therapy, and Re-evaluation.  PLAN FOR NEXT SESSION: progress strengthening as tolerated, manual therapy interventions to decrease pain and improve range of motion, review/modify HEP as needed.  Janet Berlin PT DPT 6:55 PM,11/07/22

## 2022-11-10 ENCOUNTER — Ambulatory Visit: Payer: Medicare Other

## 2022-11-10 DIAGNOSIS — M25551 Pain in right hip: Secondary | ICD-10-CM | POA: Diagnosis not present

## 2022-11-10 DIAGNOSIS — G8929 Other chronic pain: Secondary | ICD-10-CM

## 2022-11-10 DIAGNOSIS — M25572 Pain in left ankle and joints of left foot: Secondary | ICD-10-CM

## 2022-11-10 DIAGNOSIS — M25562 Pain in left knee: Secondary | ICD-10-CM | POA: Diagnosis not present

## 2022-11-10 DIAGNOSIS — M7672 Peroneal tendinitis, left leg: Secondary | ICD-10-CM | POA: Diagnosis not present

## 2022-11-10 NOTE — Therapy (Signed)
OUTPATIENT PHYSICAL THERAPY ANKLE TREATMENT  Patient Name: Brandy [name] MRN: [id number] DOB:[birth year], 67 y.o., female Today's Date: 11/10/2022  END OF SESSION:  PT End of Session - 11/07/22 1843    PT Start Time 0932    PT Stop Time 1015    PT Time Calculation (min) 43 min         PT VISIT # 5          Past Medical History:  Diagnosis Date   Acute lower GI bleeding 01/19/2020   Arthritis    Atrial fibrillation with rapid ventricular response (HCC)    Chicken pox    Chronic systolic CHF (congestive heart failure) (HCC)    a. 03/2016 Echo: Ef 15-20%, diff HK, ant AK;  b. 05/2016 Echo: Ef 30-35%, diff HK, mildly dil LA/RA.   Diabetes mellitus without complication (HCC)    Dysrhythmia    GERD (gastroesophageal reflux disease)    Heart murmur    Hip discomfort    been going on for 40 years    History of kidney stones    Hyperlipidemia associated with type 2 diabetes mellitus (HCC) 05/12/2021   Hypertension    Moderate mitral regurgitation    a. 03/2016 Echo: mod MR in setting of LV dysfxn.   Motion sickness    car - back seat   NICM (nonischemic cardiomyopathy) (HCC)    a. 03/2016 Echo: EF 15-20%, diff HK, ant AK, mod MR, mod dil LA, mildly dil RA;  b. 04/2016 Cath: nl cors;  c. 05/2016 Echo: EF 30-35%, diff HK.   Obesity    Obstructive sleep apnea    compliant with CPAP   Persistent atrial fibrillation (HCC)    a. Dx 03/2016;  b. CHA2DS2VASc = 2-->Eliquis 5mg  BID;  b. 05/2016 Failed DCCV x 4.   Sleep apnea    Stomach irritation    Visit for monitoring Tikosyn therapy 07/24/2016   Past Surgical History:  Procedure Laterality Date   ATRIAL FIBRILLATION ABLATION N/A 12/09/2020   Procedure: ATRIAL FIBRILLATION ABLATION;  Surgeon: Lanier Prude, MD;  Location: [facility];  Service: Cardiovascular;  Laterality: N/A;   BIOPSY N/A 01/12/2020   Procedure: BIOPSY;  Surgeon: Pasty Spillers, MD;  Location: [facility];  Service: Endoscopy;  Laterality:  N/A;   CARDIAC CATHETERIZATION N/A 04/17/2016   Procedure: Left Heart Cath and Coronary Angiography;  Surgeon: Iran Ouch, MD;  Location: [facility];  Service: Cardiovascular;  Laterality: N/A;   CARDIOVERSION N/A 10/22/2020   Procedure: CARDIOVERSION;  Surgeon: Yvonne Kendall, MD;  Location: [facility];  Service: Cardiovascular;  Laterality: N/A;   CHOLECYSTECTOMY     COLON SURGERY  2021   COLONOSCOPY N/A 01/20/2020   Procedure: COLONOSCOPY;  Surgeon: Regis Bill, MD;  Location: [facility];  Service: Endoscopy;  Laterality: N/A;   COLONOSCOPY WITH PROPOFOL N/A 01/12/2020   Procedure: COLONOSCOPY WITH PROPOFOL;  Surgeon: Pasty Spillers, MD;  Location: [facility];  Service: Endoscopy;  Laterality: N/A;  Diabetic - oral meds sleep apnea   ELECTROPHYSIOLOGIC STUDY N/A 05/26/2016   Procedure: Cardioversion;  Surgeon: Iran Ouch, MD;  Location: [facility];  Service: Cardiovascular;  Laterality: N/A;   ESOPHAGOGASTRODUODENOSCOPY (EGD) WITH PROPOFOL N/A 01/12/2020   Procedure: ESOPHAGOGASTRODUODENOSCOPY (EGD) WITH PROPOFOL;  Surgeon: Pasty Spillers, MD;  Location: [facility];  Service: Endoscopy;  Laterality: N/A;   POLYPECTOMY N/A 01/12/2020   Procedure: POLYPECTOMY;  Surgeon: Pasty Spillers, MD;  Location:  [facility];  Service: Endoscopy;  Laterality: N/A;   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   Varicose veins of leg with pain, bilateral 07/06/2020   Acquired  thrombophilia (HCC) 05/24/2020   Type II diabetes mellitus with complication (HCC) 01/19/2020   Gastric polyp    Colon polyps    Varicose veins of both lower extremities 10/20/2019   Dysphagia 10/20/2019   Osteoarthritis of right hand 10/31/2016   Nonischemic cardiomyopathy (HCC) 04/14/2016   BMI 33.0-33.9,adult 04/14/2016    PCP: Bari Edward MD  REFERRING PROVIDER: Joseph Berkshire MD  REFERRING DIAG: [phone] (ICD-10-CM) - Peroneal tendinitis, left   Rationale for Evaluation and Treatment: Rehabilitation  THERAPY DIAG: Pain in left ankle and joints of left foot  Chronic pain of left knee  Pain in right hip  ONSET DATE: 09/26/22 (approximate)  FOLLOW-UP APPT SCHEDULED WITH REFERRING PROVIDER: No, but sees PCP 12/20/22  FROM INITIAL EVALUATION SUBJECTIVE:                                                                                                                                                                                         SUBJECTIVE STATEMENT:  L lateral ankle pain.   PERTINENT HISTORY:  Pt reports onset of L lateral ankle and base of 5th metatarsal pain after exercising in mid May 2024. No trauma reported however pt states that she had started performing cable resisted hip abduction as well as walking with weights. She has history of calcaneal bone spurs and has seen podiatry but no history of surgery to L foot/ankle. She saw [name] who encouraged her to restart diclofenac scheduled fpr 2 weeks and then dose as needed. She was also encouraged to use a lace up ASO brace which she has been wearing with improvement. She was noticing improvement in her symptoms until she stepped in a hole this past Saturday and had a painful pop. She denies bruising or swelling afterwards. She continued walking on her LLE and over the next 24 hours noted gradual improvement.  09/19/22: Left foot 3 views AP, lateral, lateral oblique: Dorsiflex ptosis present at the midfoot level over  likely the second and third tarsometatarsal joints.  Slight pes cavus foot  structure.  Osteoarthritic changes present at the second and third tarsometatarsal joints.  Posterior and plantar calcaneal heel spurs present.  Mild metatarsus adductus.   PAIN:    Pain Intensity: Present: 1/10, Best: 0/10, Worst: 10/10 Pain location: L lateral ankle posterior to lateral malleoli and into L foot.  Pain Quality: burning, sharp, and aching ("under ankle bone") Radiating: No  Swelling: No  Popping, catching, locking: Yes  Numbness/Tingling: No Focal Weakness: No but has a history of feeling chronic instability in L ankle; Aggravating factors: walking without brace, walking on uneven surfaces,  Relieving factors: ice, diclofenac, gabapentin, Tylenol; 24-hour pain behavior: worse at the end of the day; History of prior back, hip, knee, or ankle injury, pain, surgery, or therapy: No Dominant hand: right Imaging: Yes, see history; Typical footwear:  Red flags: Negative for personal history of cancer, chills/fever, night sweats, nausea, vomiting, unrelenting pain): Negative  PRECAUTIONS: None  WEIGHT BEARING RESTRICTIONS: No  FALLS: Has patient fallen in last 6 months? 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   Hobbies: Pottery (currently not limited but initially had trouble sitting at the wheel), gardening/yard work;  Patient Goals: walking across the yard with improved confidence, lifting heavier objects at home without pain in hip, working the yard, "I would like to be able to get back to walking."   OBJECTIVE:   Patient Surveys  FOTO 38, predicted improvement to 30  Cognition Patient is  oriented to person, place, and time.  Recent memory is intact.  Remote memory is intact.  Attention span and concentration are intact.  Expressive speech is intact.  Patient's fund of knowledge is within normal limits for educational level.    Gross Musculoskeletal Assessment Bulk: Normal Tone: Normal No trophic changes noted to foot/ankle. No ecchymosis, erythema, or edema noted.  Patient has a high resting arch with minimal arch collapse during gait  GAIT: Comments: No excessive pronation/supination noted during gait.  Minimal pronation noted.  Gait is antalgic with decreased step length on right lower extremity.  Posture: No gross deficits in the posture noted that are contributing to the left ankle pain.  AROM AROM (Normal range in degrees) AROM   Lumbar   Flexion (65) Moderate loss of segmental flexion  Extension (30) Mild loss  Right lateral flexion (25) Mild loss  Left lateral flexion (25) Mild loss  Right rotation (30) Mild loss  Left rotation (30) Mild loss   AROM (Normal range in degrees) AROM   Hip Right Left  Flexion (125)  WNL  Extension (15)    Abduction (40)  WNL  Adduction   WNL  Internal Rotation (45)  Mod/severe loss  External Rotation (45)  Mild loss      Knee    Flexion (135)  WNL (soft end feel)  Extension (0)  0      Ankle    Dorsiflexion (20) 18 18  Plantarflexion (50) 64 54  Inversion (35) 24 22  Eversion (15) 16 14  (* = pain; Blank rows = not tested)  LE MMT: MMT (out of 5) Right  Left   Hip flexion 5 5  Hip extension    Hip abduction 28.0# (measured at last DC)  26.7# (measured at last DC)  Hip adduction    Hip internal rotation 5 5  Hip external rotation 5 5  Knee flexion 5 5  Knee extension 5 5  Ankle dorsiflexion 5 5*  Ankle plantarflexion 5 5*  Ankle inversion 5 5  Ankle eversion 5 5*  (* = pain; Blank rows = not tested)   Sensation Grossly intact to light touch throughout bilateral LEs as determined by testing  dermatomes L2-S2. Proprioception, stereognosis, and hot/cold testing deferred on this date.  Reflexes Deferred  Muscle Length Deferred  Palpation Generally painful to palpation around anterior and left lateral ankle.  Pain with palpation along peroneal muscles/tendons, at the base of the fifth metatarsal, and in the arch of the foot following the tendon of peroneus longus.  Pain to palpation at the sinus tarsus as well as over the deltoid ligament.  Painful palpation along distal end of fibula.  Passive Accessory Motion Superior Tibiofibular Joint: WNL Inferior Tibiofibular Joint: WNL Talocrural Joint Distraction: WNL Talocrural Joint AP: Hypomobile Talocrural Joint PA: WNL  VASCULAR Dorsalis pedis and posterior tibial pulses are palpable  SPECIAL TESTS Ligamentous Integrity Anterior Drawer (ATF, 10-15 plantarflexion with anterior translation): Negative Talar Tilt (CFL, inversion): Negative Eversion Stress Test (Deltoid, eversion): Negative External Rotation Test (High ankle, dorsiflexion and external rotation): Negative Squeeze Test (High ankle): Negative Impingment Sign (Dorsiflexion and eversion): Negative  Achilles Integrity Thompson Test: Negative  Fracture Screening Metatarsal Axial Loading: Negative Tap/Percussion Test: Negative Vibration Test: Not examined  Pronation/Supination Navicular Drop: Negative  Nerve Test Tarsal Tunnel Test (maximal DF, EV, toe ext with tapping over tarsal tunnel): Not examined Test for Morton's Neuroma (compress metatarsals and mobilize): Not examined  Other Windlass Mechanism Test: Not examined   Beighton Scale: Deferred   TODAY'S TREATMENT:   SUBJECTIVE: Pt reports of too much pain in B hip. that she is doing well today. Her ankle pain is improving considerably. " I feel that I am improving.". HEP is going well. No specific questions currently.   PAIN: 0/10 L lateral ankle; pain 1/10 with balance exs without brace.  TE:  39 mins Nu-Step 10 mins at level 4 Calf and soleus stretch using towel 60 secs hold each Supine figure 4 stretch 2 x 30 secs hold BLE Peroneal stretches 2 x 60 secs hold Manually resisted PF and Eversion 2 x 10 reps Ankle PREs in all palnes using GTB x 60 secs each Heel raises with Feet everted 2 x 20 reps Staggered STS 2 x 60 secs    Manual Therapy: 15 STM to left lower leg along peroneal muscles and tendons including posterior to the lateral malleolus, near the insertion of the peroneus brevis at the base of the fifth metatarsal, and along the transverse arch of the foot.  L ankle talocrural distraction, grade III, 30s/bout x 3 bouts; L ankle talocrural A/P mobilizations at available end range dorsiflexion, grade III, 30s/bout x 3 bouts; L ankle subtalar distraction, grade III, 30s/bout x 3 bouts; L ankle inversion and plantarflexion stretches 2 x 30s each;  Not performed: NM: 10 min Standing balance activities included: Standing on foam, tandem walking, step ups to and form foam, standing with eyes close and with EO on foam. Ther-ex  Isotonic eversion, inversion, dorsiflexion and plantarflexion 2 x 10 each; Trigger Point Dry Needling (TDN), unbilled Education previously performed with patient regarding potential benefit of TDN. Previously reviewed precautions and risks with patient. Pt provided verbal consent to treatment. Using clean technique TDN performed to  L peroneus longus/brevis with 3, 0.25 x 40 single needle placements with local deep ache reported. Pistoning technique utilized.   PATIENT EDUCATION:  Education details: Pt educated throughout session about proper posture and technique with exercises. Improved exercise technique, movement at target joints, use of target muscles after min to mod verbal, visual, tactile cues; HEP progressions; Person educated: Patient Education method: Explanation, Demonstration, Tactile cues, and Handouts Education comprehension: verbalized  understanding and returned demonstration   HOME EXERCISE PROGRAM:  Access Code: [email or web address] URL: [email or web address] Date: 11/10/2022 Prepared by: Janet Berlin  Exercises - Gastroc Stretch on Wall  - 2 x daily - 7 x weekly - 1 sets - 2 reps - 30secs hold - Soleus Stretch on Wall  - 2 x daily - 7 x weekly - 1 sets - 2 reps - 30secs hold - Tibialis Posterior Stretch at Wall  - 2 x daily - 7 x weekly - 1 sets - 2 reps - 30secs hold - Seated Ankle Eversion with Anchored Resistance  - 1 x daily - 7 x weekly - 3 sets - 10 reps - Seated Ankle Inversion with Anchored Resistance  - 1 x daily - 7 x weekly - 3 sets - 10 reps - Seated Ankle Dorsiflexion with Anchored Resistance - Leg Straight  - 1 x daily - 7 x weekly - 3 sets - 10 reps   ASSESSMENT:  CLINICAL IMPRESSION: Pt presents without Brace and feeling functional progress. Pt is slowly weaning herself off of the brace which is working well.  founf L hip jt and muscle tightness which is altering kinetic chain with standing activities. PT provided jt mobs to improve DF and reduce PF tightness. Pt demonstrates tight and painful post tibialis and Peroneal longus. Pt continues with to stretches and advanced pt with strengthening and balance exs. Introduced Peroneal stretch and hip jt stretches and strengthening.  Pt provided with updated HEP in H/O which includes today;s hip stretch and strengthening. . Pt encouraged to slowly d/c the brace indoors and to wear when outdoors. Pt agreed and demonstrated good understanding. continue her HEP and follow-up as scheduled. Pt will benefit from PT services to address deficits in range of motion and pain in order to return to full function at home, work, and when working in the yard with less pain.   OBJECTIVE IMPAIRMENTS: Abnormal gait, difficulty walking, decreased ROM, and pain.   ACTIVITY LIMITATIONS: squatting and stairs  PARTICIPATION LIMITATIONS: community activity, occupation, and yard  work  PERSONAL FACTORS: Age, Past/current experiences, and 3+ comorbidities: DMII, lumbar radiculopathy, and bilateral knee OA  are also affecting patient's functional outcome.   REHAB POTENTIAL: Excellent  CLINICAL DECISION MAKING: Stable/uncomplicated  EVALUATION COMPLEXITY: Low   GOALS: Goals reviewed with patient? No  SHORT TERM GOALS: Target date: 11/21/2022  Pt will be independent with HEP to improve strength and decrease ankle pain to improve pain-free function at home and work. Baseline:  Goal status: INITIAL   LONG TERM GOALS: Target date: 12/19/2022  Pt will increase FOTO to at least 60 to demonstrate significant improvement in function at home and work related to ankle pain  Baseline: 10/24/22: 38 Goal status: INITIAL  2.  Pt will decrease worst ankle pain by at least 3 points on the NPRS in order to demonstrate clinically significant reduction in ankle pain. Baseline: 10/24/22: worst: 10/10; Goal status: INITIAL  3.  Pt will report at least 75% improvement in symptoms in order demonstrate clinically significant reduction in ankle pain/disability.  Baseline:  Goal status: INITIAL  4.  Pt will be able to bend, squat, and walk across an uneven yard without an increase in her L ankle pain in order to resume working in her yard/garden as well as return to walking for exercise with less pain Baseline: 10/24/22: Notable painful in L ankle Goal status: INITIAL   PLAN: PT FREQUENCY: 1-2x/week  PT DURATION: 8 weeks  PLANNED INTERVENTIONS: Therapeutic exercises, Therapeutic activity, Neuromuscular re-education, Balance training, Gait training, Patient/Family education, Self Care, Joint mobilization, Joint manipulation, Vestibular training, Canalith repositioning, Orthotic/Fit training, DME instructions, Dry Needling, Electrical stimulation, Spinal manipulation, Spinal mobilization, Cryotherapy, Moist heat, Taping, Traction, Ultrasound, Ionotophoresis 4mg /ml Dexamethasone,  Manual therapy, and Re-evaluation.  PLAN FOR NEXT SESSION: progress strengthening as tolerated, manual therapy interventions to decrease pain and improve range of motion, review/modify HEP as needed.  Janet Berlin PT DPT 12:02 PM,11/10/22

## 2022-11-13 ENCOUNTER — Ambulatory Visit (INDEPENDENT_AMBULATORY_CARE_PROVIDER_SITE_OTHER): Payer: Medicare Other

## 2022-11-13 DIAGNOSIS — I428 Other cardiomyopathies: Secondary | ICD-10-CM | POA: Diagnosis not present

## 2022-11-17 ENCOUNTER — Ambulatory Visit: Payer: Medicare Other | Attending: Family Medicine

## 2022-11-17 DIAGNOSIS — M25572 Pain in left ankle and joints of left foot: Secondary | ICD-10-CM | POA: Insufficient documentation

## 2022-11-17 DIAGNOSIS — M6281 Muscle weakness (generalized): Secondary | ICD-10-CM | POA: Diagnosis not present

## 2022-11-17 NOTE — Therapy (Signed)
OUTPATIENT PHYSICAL THERAPY ANKLE TREATMENT  Patient Name: Brandy [name] MRN: [id number] DOB:November 11, 1955, 67 y.o., female Today's Date: 11/21/2022  END OF SESSION:  PT End of Session - 11/21/22 0936     Visit Number 8    Number of Visits 17    Date for PT Re-Evaluation 12/19/22    Authorization Type eval: 10/24/22, BCBS Medicare 2024  [place]  $10 cop[place]  no auth req    PT Start Time 0933    PT Stop Time 1015    PT Time Calculation (min) 42 min    Activity Tolerance Patient tolerated treatment well    Behavior During Therapy [facility] for tasks assessed/performed            Past Medical History:  Diagnosis Date   Acute lower GI bleeding 01/19/2020   Arthritis    Atrial fibrillation with rapid ventricular response (HCC)    Chicken pox    Chronic systolic CHF (congestive heart failure) (HCC)    a. 03/2016 Echo: Ef 15-20%, diff HK, ant AK;  b. 05/2016 Echo: Ef 30-35%, diff HK, mildly dil LA/RA.   Diabetes mellitus without complication (HCC)    Dysrhythmia    GERD (gastroesophageal reflux disease)    Heart murmur    Hip discomfort    been going on for 40 years    History of kidney stones    Hyperlipidemia associated with type 2 diabetes mellitus (HCC) 05/12/2021   Hypertension    Moderate mitral regurgitation    a. 03/2016 Echo: mod MR in setting of LV dysfxn.   Motion sickness    car - back seat   NICM (nonischemic cardiomyopathy) (HCC)    a. 03/2016 Echo: EF 15-20%, diff HK, ant AK, mod MR, mod dil LA, mildly dil RA;  b. 04/2016 Cath: nl cors;  c. 05/2016 Echo: EF 30-35%, diff HK.   Obesity    Obstructive sleep apnea    compliant with CPAP   Persistent atrial fibrillation (HCC)    a. Dx 03/2016;  b. CHA2DS2VASc = 2-->Eliquis 5mg  BID;  b. 05/2016 Failed DCCV x 4.   Sleep apnea    Stomach irritation    Visit for monitoring Tikosyn therapy 07/24/2016   Past Surgical History:  Procedure Laterality Date   ATRIAL FIBRILLATION ABLATION N/A 12/09/2020   Procedure: ATRIAL  FIBRILLATION ABLATION;  Surgeon: Lanier Prude, MD;  Location: [facility];  Service: Cardiovascular;  Laterality: N/A;   BIOPSY N/A 01/12/2020   Procedure: BIOPSY;  Surgeon: Pasty Spillers, MD;  Location: [facility];  Service: Endoscopy;  Laterality: N/A;   CARDIAC CATHETERIZATION N/A 04/17/2016   Procedure: Left Heart Cath and Coronary Angiography;  Surgeon: Iran Ouch, MD;  Location: [facility] INVASIVE CV LAB;  Service: Cardiovascular;  Laterality: N/A;   CARDIOVERSION N/A 10/22/2020   Procedure: CARDIOVERSION;  Surgeon: Yvonne Kendall, MD;  Location: [facility] ORS;  Service: Cardiovascular;  Laterality: N/A;   CHOLECYSTECTOMY     COLON SURGERY  2021   COLONOSCOPY N/A 01/20/2020   Procedure: COLONOSCOPY;  Surgeon: Regis Bill, MD;  Location: [facility] ENDOSCOPY;  Service: Endoscopy;  Laterality: N/A;   COLONOSCOPY WITH PROPOFOL N/A 01/12/2020   Procedure: COLONOSCOPY WITH PROPOFOL;  Surgeon: Pasty Spillers, MD;  Location: [facility];  Service: Endoscopy;  Laterality: N/A;  Diabetic - oral meds sleep apnea   ELECTROPHYSIOLOGIC STUDY N/A 05/26/2016   Procedure: Cardioversion;  Surgeon: Iran Ouch, MD;  Location: [facility] ORS;  Service: Cardiovascular;  Laterality: N/A;   ESOPHAGOGASTRODUODENOSCOPY (EGD) WITH PROPOFOL N/A 01/12/2020   Procedure: ESOPHAGOGASTRODUODENOSCOPY (EGD) WITH PROPOFOL;  Surgeon: Pasty Spillers, MD;  Location: [facility];  Service: Endoscopy;  Laterality: N/A;   POLYPECTOMY N/A 01/12/2020   Procedure: POLYPECTOMY;  Surgeon: Pasty Spillers, MD;  Location: [facility];  Service: Endoscopy;  Laterality: N/A;   Patient Active Problem List   Diagnosis Date Noted   Peroneal tendinitis, left 10/13/2022   Left lumbar radiculopathy 07/26/2022   Arthralgia of left knee 07/26/2022   Pes anserinus bursitis of right knee 04/19/2022   Primary osteoarthritis of right knee 03/27/2022   Greater trochanteric pain  syndrome of right lower extremity 03/27/2022   It band syndrome, right 03/27/2022   Hyperlipidemia associated with type 2 diabetes mellitus (HCC) 05/12/2021   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   Varicose veins of leg with pain, bilateral 07/06/2020   Acquired thrombophilia (HCC) 05/24/2020   Type II diabetes mellitus with complication (HCC) 01/19/2020   Gastric polyp    Colon polyps    Varicose veins of both lower extremities 10/20/2019   Dysphagia 10/20/2019   Osteoarthritis of right hand 10/31/2016   Nonischemic cardiomyopathy (HCC) 04/14/2016   BMI 33.0-33.9,adult 04/14/2016    PCP: Bari Edward MD  REFERRING PROVIDER: Joseph Berkshire MD  REFERRING DIAG: [phone] (ICD-10-CM) - Peroneal tendinitis, left   Rationale for Evaluation and Treatment: Rehabilitation  THERAPY DIAG: Pain in left ankle and joints of left foot  ONSET DATE: 09/26/22 (approximate)  FOLLOW-UP APPT SCHEDULED WITH REFERRING PROVIDER: No, but sees PCP 12/20/22  FROM INITIAL EVALUATION SUBJECTIVE:                                                                                                                                                                                         SUBJECTIVE STATEMENT:  L lateral ankle pain.   PERTINENT HISTORY:  Pt reports onset of L lateral ankle and base of 5th metatarsal pain after exercising in mid May 2024. No trauma reported however pt states that she had started performing cable resisted hip abduction as well as walking with weights. She has history of calcaneal bone spurs and has seen podiatry but no history of surgery to L foot/ankle. She saw [name] who encouraged her to restart diclofenac scheduled fpr 2 weeks and then dose as needed. She was also encouraged to use a lace up ASO brace which she has been wearing with improvement. She was noticing improvement in her symptoms until she stepped in a hole this past Saturday and had a painful pop. She denies  bruising or swelling afterwards. She continued walking on her  LLE and over the next 24 hours noted gradual improvement.  09/19/22: Left foot 3 views AP, lateral, lateral oblique: Dorsiflex ptosis present at the midfoot level over likely the second and third tarsometatarsal joints.  Slight pes cavus foot structure.  Osteoarthritic changes present at the second and third tarsometatarsal joints.  Posterior and plantar calcaneal heel spurs present.  Mild metatarsus adductus.   PAIN:    Pain Intensity: Present: 1/10, Best: 0/10, Worst: 10/10 Pain location: L lateral ankle posterior to lateral malleoli and into L foot.  Pain Quality: burning, sharp, and aching ("under ankle bone") Radiating: No  Swelling: No  Popping, catching, locking: Yes  Numbness/Tingling: No Focal Weakness: No but has a history of feeling chronic instability in L ankle; Aggravating factors: walking without brace, walking on uneven surfaces,  Relieving factors: ice, diclofenac, gabapentin, Tylenol; 24-hour pain behavior: worse at the end of the day; History of prior back, hip, knee, or ankle injury, pain, surgery, or therapy: No Dominant hand: right Imaging: Yes, see history; Typical footwear:  Red flags: Negative for personal history of cancer, chills/fever, night sweats, nausea, vomiting, unrelenting pain): Negative  PRECAUTIONS: None  WEIGHT BEARING RESTRICTIONS: No  FALLS: Has patient fallen in last 6 months? 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   Hobbies: Pottery (currently not limited but initially had trouble sitting at the wheel), gardening/yard work;  Patient Goals: walking across the yard with improved  confidence, lifting heavier objects at home without pain in hip, working the yard, "I would like to be able to get back to walking."   OBJECTIVE:   Patient Surveys  FOTO 38, predicted improvement to 93  Cognition Patient is oriented to person, place, and time.  Recent memory is intact.  Remote memory is intact.  Attention span and concentration are intact.  Expressive speech is intact.  Patient's fund of knowledge is within normal limits for educational level.    Gross Musculoskeletal Assessment Bulk: Normal Tone: Normal No trophic changes noted to foot/ankle. No ecchymosis, erythema, or edema noted.  Patient has a high resting arch with minimal arch collapse during gait  GAIT: Comments: No excessive pronation/supination noted during gait.  Minimal pronation noted.  Gait is antalgic with decreased step length on right lower extremity.  Posture: No gross deficits in the posture noted that are contributing to the left ankle pain.  AROM AROM (Normal range in degrees) AROM   Lumbar   Flexion (65) Moderate loss of segmental flexion  Extension (30) Mild loss  Right lateral flexion (25) Mild loss  Left lateral flexion (25) Mild loss  Right rotation (30) Mild loss  Left rotation (30) Mild loss   AROM (Normal range in degrees) AROM   Hip Right Left  Flexion (125)  WNL  Extension (15)    Abduction (40)  WNL  Adduction   WNL  Internal Rotation (45)  Mod/severe loss  External Rotation (45)  Mild loss      Knee    Flexion (135)  WNL (soft end feel)  Extension (0)  0      Ankle    Dorsiflexion (20) 18 18  Plantarflexion (50) 64 54  Inversion (35) 24 22  Eversion (15)  16 14  (* = pain; Blank rows = not tested)  LE MMT: MMT (out of 5) Right  Left   Hip flexion 5 5  Hip extension    Hip abduction 28.0# (measured at last DC) 26.7# (measured at last DC)  Hip adduction    Hip internal rotation 5 5  Hip external rotation 5 5  Knee flexion 5 5  Knee extension 5 5  Ankle  dorsiflexion 5 5*  Ankle plantarflexion 5 5*  Ankle inversion 5 5  Ankle eversion 5 5*  (* = pain; Blank rows = not tested)  Sensation Grossly intact to light touch throughout bilateral LEs as determined by testing dermatomes L2-S2. Proprioception, stereognosis, and hot/cold testing deferred on this date.  Palpation Generally painful to palpation around anterior and left lateral ankle.  Pain with palpation along peroneal muscles/tendons, at the base of the fifth metatarsal, and in the arch of the foot following the tendon of peroneus longus.  Pain to palpation at the sinus tarsus as well as over the deltoid ligament.  Painful palpation along distal end of fibula.  Passive Accessory Motion Superior Tibiofibular Joint: WNL Inferior Tibiofibular Joint: WNL Talocrural Joint Distraction: WNL Talocrural Joint AP: Hypomobile Talocrural Joint PA: WNL  VASCULAR Dorsalis pedis and posterior tibial pulses are palpable  SPECIAL TESTS Ligamentous Integrity Anterior Drawer (ATF, 10-15 plantarflexion with anterior translation): Negative Talar Tilt (CFL, inversion): Negative Eversion Stress Test (Deltoid, eversion): Negative External Rotation Test (High ankle, dorsiflexion and external rotation): Negative Squeeze Test (High ankle): Negative Impingment Sign (Dorsiflexion and eversion): Negative  Achilles Integrity Thompson Test: Negative  Fracture Screening Metatarsal Axial Loading: Negative Tap/Percussion Test: Negative Vibration Test: Not examined  Pronation/Supination Navicular Drop: Negative  Nerve Test Tarsal Tunnel Test (maximal DF, EV, toe ext with tapping over tarsal tunnel): Not examined Test for Morton's Neuroma (compress metatarsals and mobilize): Not examined  Other Windlass Mechanism Test: Not examined   Beighton Scale: Deferred   TODAY'S TREATMENT:   SUBJECTIVE: Pt reports that she is doing well today. She reports continued improvement in her ankle pain but has  been having some more L knee and L hip pain recently. She arrives out of her lace-up ASO brace. HEP is going well. No specific questions currently.   PAIN: Denies L lateral ankle pain;   Manual Therapy  STM to left lower leg along peroneal muscles and tendons including posterior to the lateral malleolus, near the insertion of the peroneus brevis at the base of the fifth metatarsal, and along the transverse arch of the foot.  L ankle talocrural distraction, grade III, 30s/bout x 3 bouts; L ankle talocrural A/P mobilizations at available end range dorsiflexion, grade III, 30s/bout x 3 bouts; L ankle subtalar distraction, grade III, 30s/bout x 3 bouts;   Ther-ex  Isotonic eversion, inversion, dorsiflexion and plantarflexion 3 x 10 each; 6" step heel raises x 15, pt encouraged to alter weight bearing % through LLE in order to perform with <3/10 pain; Rockerboard gastroc stretch 3 x 45s; Rockerboard double leg balance in A/P orientations 3 x 60s;   PATIENT EDUCATION:  Education details: Pt educated throughout session about proper posture and technique with exercises. Improved exercise technique, movement at target joints, use of target muscles after min to mod verbal, visual, tactile cues; HEP progressions; Person educated: Patient Education method: Explanation, Demonstration, and Tactile cues Education comprehension: verbalized understanding and returned demonstration   HOME EXERCISE PROGRAM:  Access Code: [email or web address] URL: [email or web address] Date: 11/17/2022 Prepared by: Ria Comment  Exercises - Isometric Ankle Eversion at Wall  - 2 x daily - 7 x weekly - 1 sets - 10 reps - 10s on/10s off hold - Isometric Ankle Inversion at Wall (Mirrored)  - 2 x daily - 7 x weekly - 1 sets - 10 reps - 10s on/10s off hold - Long Sitting Calf Stretch with Strap  - 2 x daily - 7 x weekly - 3 reps - 30-45s hold - Standing Bilateral Heel Raise on Step  - 1 x daily - 7 x weekly - 2 sets -  10 reps - Single Leg Stance  - 1 x daily - 7 x weekly - 3 reps - 30s hold   ASSESSMENT:  CLINICAL IMPRESSION: Progressed manual therapy and strengthening with patient during session today. Continued isotonic L ankle exercises. Repeated standing heel raises and utilized rockerboard again to stretch and challenge balance. Deferred trigger point dry needling today given low pain and absence of trigger points identified during STM. Pt encouraged to continue her HEP and follow-up as scheduled. Pt will benefit from PT services to address deficits in range of motion and pain in order to return to full function at home, work, and when working in the yard with less pain.   OBJECTIVE IMPAIRMENTS: Abnormal gait, difficulty walking, decreased ROM, and pain.   ACTIVITY LIMITATIONS: squatting and stairs  PARTICIPATION LIMITATIONS: community activity, occupation, and yard work  PERSONAL FACTORS: Age, Past/current experiences, and 3+ comorbidities: DMII, lumbar radiculopathy, and bilateral knee OA  are also affecting patient's functional outcome.   REHAB POTENTIAL: Excellent  CLINICAL DECISION MAKING: Stable/uncomplicated  EVALUATION COMPLEXITY: Low   GOALS: Goals reviewed with patient? No  SHORT TERM GOALS: Target date: 11/21/2022  Pt will be independent with HEP to improve strength and decrease ankle pain to improve pain-free function at home and work. Baseline:  Goal status: INITIAL   LONG TERM GOALS: Target date: 12/19/2022  Pt will increase FOTO to at least 60 to demonstrate significant improvement in function at home and work related to ankle pain  Baseline: 10/24/22: 38 Goal status: INITIAL  2.  Pt will decrease worst ankle pain by at least 3 points on the NPRS in order to demonstrate clinically significant reduction in ankle pain. Baseline: 10/24/22: worst: 10/10; Goal status: INITIAL  3.  Pt will report at least 75% improvement in symptoms in order demonstrate clinically significant  reduction in ankle pain/disability.       Baseline:  Goal status: INITIAL  4.  Pt will be able to bend, squat, and walk across an uneven yard without an increase in her L ankle pain in order to resume working in her yard/garden as well as return to walking for exercise with less pain Baseline: 10/24/22: Notable painful in L ankle Goal status: INITIAL   PLAN: PT FREQUENCY: 1-2x/week  PT DURATION: 8 weeks  PLANNED INTERVENTIONS: Therapeutic exercises, Therapeutic activity, Neuromuscular re-education, Balance training, Gait training, Patient/Family education, Self Care, Joint mobilization, Joint manipulation, Vestibular training, Canalith repositioning, Orthotic/Fit training, DME instructions, Dry Needling, Electrical stimulation, Spinal manipulation, Spinal mobilization, Cryotherapy, Moist heat, Taping, Traction, Ultrasound, Ionotophoresis 4mg /ml Dexamethasone, Manual therapy, and Re-evaluation.  PLAN FOR NEXT SESSION: progress strengthening as tolerated, manual therapy interventions to decrease pain and improve range of motion, review/modify HEP as needed.   Sharalyn Ink Omari Mcmanaway PT, DPT, GCS  Joella Saefong, PT 11/21/2022, 12:52 PM

## 2022-11-17 NOTE — Therapy (Signed)
OUTPATIENT PHYSICAL THERAPY ANKLE TREATMENT  Patient Name: Brandy [name] MRN: [id number] DOB:Jan 29, 1956, 67 y.o., female Today's Date: 11/17/2022  END OF SESSION:  PT End of Session - 11/17/22 0840     Visit Number 7    Number of Visits 17    Date for PT Re-Evaluation 12/19/22    Authorization Type eval: 10/24/22, BC[employer] Medicare 2024  [id number] on [place]  $10 copay  no auth req    PT Start Time 0845    PT Stop Time 0930    PT Time Calculation (min) 45 min    Activity Tolerance Patient tolerated treatment well    Behavior During Therapy [facility] for tasks assessed/performed            Past Medical History:  Diagnosis Date   Acute lower GI bleeding 01/19/2020   Arthritis    Atrial fibrillation with rapid ventricular response (HCC)    Chicken pox    Chronic systolic CHF (congestive heart failure) (HCC)    a. 03/2016 Echo: Ef 15-20%, diff HK, ant AK;  b. 05/2016 Echo: Ef 30-35%, diff HK, mildly dil LA/RA.   Diabetes mellitus without complication (HCC)    Dysrhythmia    GERD (gastroesophageal reflux disease)    Heart murmur    Hip discomfort    been going on for 40 years    History of kidney stones    Hyperlipidemia associated with type 2 diabetes mellitus (HCC) 05/12/2021   Hypertension    Moderate mitral regurgitation    a. 03/2016 Echo: mod MR in setting of LV dysfxn.   Motion sickness    car - back seat   NICM (nonischemic cardiomyopathy) (HCC)    a. 03/2016 Echo: EF 15-20%, diff HK, ant AK, mod MR, mod dil LA, mildly dil RA;  b. 04/2016 Cath: nl cors;  c. 05/2016 Echo: EF 30-35%, diff HK.   Obesity    Obstructive sleep apnea    compliant with CPAP   Persistent atrial fibrillation (HCC)    a. Dx 03/2016;  b. CHA2DS2VASc = 2-->Eliquis 5mg  BID;  b. 05/2016 Failed DCCV x 4.   Sleep apnea    Stomach irritation    Visit for monitoring Tikosyn therapy 07/24/2016   Past Surgical History:  Procedure Laterality Date   ATRIAL FIBRILLATION ABLATION N/A 12/09/2020   Procedure: ATRIAL  FIBRILLATION ABLATION;  Surgeon: Lanier Prude, MD;  Location: [facility];  Service: Cardiovascular;  Laterality: N/A;   BIOPSY N/A 01/12/2020   Procedure: BIOPSY;  Surgeon: Pasty Spillers, MD;  Location: [facility];  Service: Endoscopy;  Laterality: N/A;   CARDIAC CATHETERIZATION N/A 04/17/2016   Procedure: Left Heart Cath and Coronary Angiography;  Surgeon: Iran Ouch, MD;  Location: [facility];  Service: Cardiovascular;  Laterality: N/A;   CARDIOVERSION N/A 10/22/2020   Procedure: CARDIOVERSION;  Surgeon: Yvonne Kendall, MD;  Location: [facility];  Service: Cardiovascular;  Laterality: N/A;   CHOLECYSTECTOMY     COLON SURGERY  2021   COLONOSCOPY N/A 01/20/2020   Procedure: COLONOSCOPY;  Surgeon: Regis Bill, MD;  Location: [facility];  Service: Endoscopy;  Laterality: N/A;   COLONOSCOPY WITH PROPOFOL N/A 01/12/2020   Procedure: COLONOSCOPY WITH PROPOFOL;  Surgeon: Pasty Spillers, MD;  Location: [facility];  Service: Endoscopy;  Laterality: N/A;  Diabetic - oral meds sleep apnea   ELECTROPHYSIOLOGIC STUDY N/A 05/26/2016   Procedure: Cardioversion;  Surgeon: Iran Ouch, MD;  Location: [facility];  Service: Cardiovascular;  Laterality: N/A;   ESOPHAGOGASTRODUODENOSCOPY (EGD) WITH PROPOFOL N/A 01/12/2020   Procedure: ESOPHAGOGASTRODUODENOSCOPY (EGD) WITH PROPOFOL;  Surgeon: Pasty Spillers, MD;  Location: [facility];  Service: Endoscopy;  Laterality: N/A;   POLYPECTOMY N/A 01/12/2020   Procedure: POLYPECTOMY;  Surgeon: Pasty Spillers, MD;  Location: [facility];  Service: Endoscopy;  Laterality: N/A;   Patient Active Problem List   Diagnosis Date Noted   Peroneal tendinitis, left 10/13/2022   Left lumbar radiculopathy 07/26/2022   Arthralgia of left knee 07/26/2022   Pes anserinus bursitis of right knee 04/19/2022   Primary osteoarthritis of right knee 03/27/2022   Greater trochanteric pain  syndrome of right lower extremity 03/27/2022   It band syndrome, right 03/27/2022   Hyperlipidemia associated with type 2 diabetes mellitus (HCC) 05/12/2021   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   Varicose veins of leg with pain, bilateral 07/06/2020   Acquired thrombophilia (HCC) 05/24/2020   Type II diabetes mellitus with complication (HCC) 01/19/2020   Gastric polyp    Colon polyps    Varicose veins of both lower extremities 10/20/2019   Dysphagia 10/20/2019   Osteoarthritis of right hand 10/31/2016   Nonischemic cardiomyopathy (HCC) 04/14/2016   BMI 33.0-33.9,adult 04/14/2016    PCP: Bari Edward MD  REFERRING PROVIDER: Joseph Berkshire MD  REFERRING DIAG: [phone] (ICD-10-CM) - Peroneal tendinitis, left   Rationale for Evaluation and Treatment: Rehabilitation  THERAPY DIAG: Pain in left ankle and joints of left foot  ONSET DATE: 09/26/22 (approximate)  FOLLOW-UP APPT SCHEDULED WITH REFERRING PROVIDER: No, but sees PCP 12/20/22  FROM INITIAL EVALUATION SUBJECTIVE:                                                                                                                                                                                         SUBJECTIVE STATEMENT:  L lateral ankle pain.   PERTINENT HISTORY:  Pt reports onset of L lateral ankle and base of 5th metatarsal pain after exercising in mid May 2024. No trauma reported however pt states that she had started performing cable resisted hip abduction as well as walking with weights. She has history of calcaneal bone spurs and has seen podiatry but no history of surgery to L foot/ankle. She saw [name] who encouraged her to restart diclofenac scheduled fpr 2 weeks and then dose as needed. She was also encouraged to use a lace up ASO brace which she has been wearing with improvement. She was noticing improvement in her symptoms until she stepped in a hole this past Saturday and had a painful pop. She denies  bruising or swelling afterwards. She continued walking on her  LLE and over the next 24 hours noted gradual improvement.  09/19/22: Left foot 3 views AP, lateral, lateral oblique: Dorsiflex ptosis present at the midfoot level over likely the second and third tarsometatarsal joints.  Slight pes cavus foot structure.  Osteoarthritic changes present at the second and third tarsometatarsal joints.  Posterior and plantar calcaneal heel spurs present.  Mild metatarsus adductus.   PAIN:    Pain Intensity: Present: 1/10, Best: 0/10, Worst: 10/10 Pain location: L lateral ankle posterior to lateral malleoli and into L foot.  Pain Quality: burning, sharp, and aching ("under ankle bone") Radiating: No  Swelling: No  Popping, catching, locking: Yes  Numbness/Tingling: No Focal Weakness: No but has a history of feeling chronic instability in L ankle; Aggravating factors: walking without brace, walking on uneven surfaces,  Relieving factors: ice, diclofenac, gabapentin, Tylenol; 24-hour pain behavior: worse at the end of the day; History of prior back, hip, knee, or ankle injury, pain, surgery, or therapy: No Dominant hand: right Imaging: Yes, see history; Typical footwear:  Red flags: Negative for personal history of cancer, chills/fever, night sweats, nausea, vomiting, unrelenting pain): Negative  PRECAUTIONS: None  WEIGHT BEARING RESTRICTIONS: No  FALLS: Has patient fallen in last 6 months? 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   Hobbies: Pottery (currently not limited but initially had trouble sitting at the wheel), gardening/yard work;  Patient Goals: walking across the yard with improved  confidence, lifting heavier objects at home without pain in hip, working the yard, "I would like to be able to get back to walking."   OBJECTIVE:   Patient Surveys  FOTO 38, predicted improvement to 6  Cognition Patient is oriented to person, place, and time.  Recent memory is intact.  Remote memory is intact.  Attention span and concentration are intact.  Expressive speech is intact.  Patient's fund of knowledge is within normal limits for educational level.    Gross Musculoskeletal Assessment Bulk: Normal Tone: Normal No trophic changes noted to foot/ankle. No ecchymosis, erythema, or edema noted.  Patient has a high resting arch with minimal arch collapse during gait  GAIT: Comments: No excessive pronation/supination noted during gait.  Minimal pronation noted.  Gait is antalgic with decreased step length on right lower extremity.  Posture: No gross deficits in the posture noted that are contributing to the left ankle pain.  AROM AROM (Normal range in degrees) AROM   Lumbar   Flexion (65) Moderate loss of segmental flexion  Extension (30) Mild loss  Right lateral flexion (25) Mild loss  Left lateral flexion (25) Mild loss  Right rotation (30) Mild loss  Left rotation (30) Mild loss   AROM (Normal range in degrees) AROM   Hip Right Left  Flexion (125)  WNL  Extension (15)    Abduction (40)  WNL  Adduction   WNL  Internal Rotation (45)  Mod/severe loss  External Rotation (45)  Mild loss      Knee    Flexion (135)  WNL (soft end feel)  Extension (0)  0      Ankle    Dorsiflexion (20) 18 18  Plantarflexion (50) 64 54  Inversion (35) 24 22  Eversion (15)  16 14  (* = pain; Blank rows = not tested)  LE MMT: MMT (out of 5) Right  Left   Hip flexion 5 5  Hip extension    Hip abduction 28.0# (measured at last DC) 26.7# (measured at last DC)  Hip adduction    Hip internal rotation 5 5  Hip external rotation 5 5  Knee flexion 5 5  Knee extension 5 5  Ankle  dorsiflexion 5 5*  Ankle plantarflexion 5 5*  Ankle inversion 5 5  Ankle eversion 5 5*  (* = pain; Blank rows = not tested)  Sensation Grossly intact to light touch throughout bilateral LEs as determined by testing dermatomes L2-S2. Proprioception, stereognosis, and hot/cold testing deferred on this date.  Palpation Generally painful to palpation around anterior and left lateral ankle.  Pain with palpation along peroneal muscles/tendons, at the base of the fifth metatarsal, and in the arch of the foot following the tendon of peroneus longus.  Pain to palpation at the sinus tarsus as well as over the deltoid ligament.  Painful palpation along distal end of fibula.  Passive Accessory Motion Superior Tibiofibular Joint: WNL Inferior Tibiofibular Joint: WNL Talocrural Joint Distraction: WNL Talocrural Joint AP: Hypomobile Talocrural Joint PA: WNL  VASCULAR Dorsalis pedis and posterior tibial pulses are palpable  SPECIAL TESTS Ligamentous Integrity Anterior Drawer (ATF, 10-15 plantarflexion with anterior translation): Negative Talar Tilt (CFL, inversion): Negative Eversion Stress Test (Deltoid, eversion): Negative External Rotation Test (High ankle, dorsiflexion and external rotation): Negative Squeeze Test (High ankle): Negative Impingment Sign (Dorsiflexion and eversion): Negative  Achilles Integrity Thompson Test: Negative  Fracture Screening Metatarsal Axial Loading: Negative Tap/Percussion Test: Negative Vibration Test: Not examined  Pronation/Supination Navicular Drop: Negative  Nerve Test Tarsal Tunnel Test (maximal DF, EV, toe ext with tapping over tarsal tunnel): Not examined Test for Morton's Neuroma (compress metatarsals and mobilize): Not examined  Other Windlass Mechanism Test: Not examined   Beighton Scale: Deferred   TODAY'S TREATMENT:   SUBJECTIVE: Pt reports that she is doing well today. She reports at least 70% improvement in her ankle pain since  starting with therapy. She has been weaning out of her lace-up ASO brace. HEP is going well. No specific questions currently.   PAIN: Denies L lateral ankle pain;   Manual Therapy  Moist heat pack applied to L ankle at start of session during interval history; STM to left lower leg along peroneal muscles and tendons including posterior to the lateral malleolus, near the insertion of the peroneus brevis at the base of the fifth metatarsal, and along the transverse arch of the foot.  L ankle talocrural distraction, grade III, 30s/bout x 3 bouts; L ankle talocrural A/P mobilizations at available end range dorsiflexion, grade III, 30s/bout x 3 bouts; L ankle subtalar distraction, grade III, 30s/bout x 3 bouts; L ankle inversion and eversion stretches 2 x 30s each;   Ther-ex  Isotonic eversion, inversion, dorsiflexion and plantarflexion 2 x 10 each; 6" step heel raises 2 x 10, attempted eccentrics but too painful, pt encouraged to alter weight bearing % through LLE in order to perform with <3/10 pain; Rockerboard gastroc stretch 3 x 45s; Rockerboard double leg balance in A/P and R/L orientations x 60s each; Airex L single leg balance x 30s, too challenging so regressed to flat ground single leg balance x 30s; Discussed HEP progressions   PATIENT EDUCATION:  Education details: Pt educated throughout session about proper posture and technique with exercises. Improved exercise technique, movement at target  joints, use of target muscles after min to mod verbal, visual, tactile cues; HEP progressions; Person educated: Patient Education method: Explanation, Demonstration, and Tactile cues Education comprehension: verbalized understanding and returned demonstration   HOME EXERCISE PROGRAM:  Access Code: [email or web address] URL: [email or web address] Date: 11/17/2022 Prepared by: Ria Comment  Exercises - Isometric Ankle Eversion at Wall  - 2 x daily - 7 x weekly - 1 sets - 10 reps - 10s  on/10s off hold - Isometric Ankle Inversion at Wall (Mirrored)  - 2 x daily - 7 x weekly - 1 sets - 10 reps - 10s on/10s off hold - Long Sitting Calf Stretch with Strap  - 2 x daily - 7 x weekly - 3 reps - 30-45s hold - Standing Bilateral Heel Raise on Step  - 1 x daily - 7 x weekly - 2 sets - 10 reps - Single Leg Stance  - 1 x daily - 7 x weekly - 3 reps - 30s hold   ASSESSMENT:  CLINICAL IMPRESSION: Progressed manual therapy and strengthening with patient during session today. Continued isotonic L ankle exercises. Progressed to standing heel raises and utilized unstable surfaces such as rockerboard and Airex to challenge L ankle stability. Educated about HEP progressions. Deferred trigger point dry needling today. Pt encouraged to continue her HEP and follow-up as scheduled. Pt will benefit from PT services to address deficits in range of motion and pain in order to return to full function at home, work, and when working in the yard with less pain.   OBJECTIVE IMPAIRMENTS: Abnormal gait, difficulty walking, decreased ROM, and pain.   ACTIVITY LIMITATIONS: squatting and stairs  PARTICIPATION LIMITATIONS: community activity, occupation, and yard work  PERSONAL FACTORS: Age, Past/current experiences, and 3+ comorbidities: DMII, lumbar radiculopathy, and bilateral knee OA  are also affecting patient's functional outcome.   REHAB POTENTIAL: Excellent  CLINICAL DECISION MAKING: Stable/uncomplicated  EVALUATION COMPLEXITY: Low   GOALS: Goals reviewed with patient? No  SHORT TERM GOALS: Target date: 11/21/2022  Pt will be independent with HEP to improve strength and decrease ankle pain to improve pain-free function at home and work. Baseline:  Goal status: INITIAL   LONG TERM GOALS: Target date: 12/19/2022  Pt will increase FOTO to at least 60 to demonstrate significant improvement in function at home and work related to ankle pain  Baseline: 10/24/22: 38 Goal status: INITIAL  2.  Pt  will decrease worst ankle pain by at least 3 points on the NPRS in order to demonstrate clinically significant reduction in ankle pain. Baseline: 10/24/22: worst: 10/10; Goal status: INITIAL  3.  Pt will report at least 75% improvement in symptoms in order demonstrate clinically significant reduction in ankle pain/disability.       Baseline:  Goal status: INITIAL  4.  Pt will be able to bend, squat, and walk across an uneven yard without an increase in her L ankle pain in order to resume working in her yard/garden as well as return to walking for exercise with less pain Baseline: 10/24/22: Notable painful in L ankle Goal status: INITIAL   PLAN: PT FREQUENCY: 1-2x/week  PT DURATION: 8 weeks  PLANNED INTERVENTIONS: Therapeutic exercises, Therapeutic activity, Neuromuscular re-education, Balance training, Gait training, Patient/Family education, Self Care, Joint mobilization, Joint manipulation, Vestibular training, Canalith repositioning, Orthotic/Fit training, DME instructions, Dry Needling, Electrical stimulation, Spinal manipulation, Spinal mobilization, Cryotherapy, Moist heat, Taping, Traction, Ultrasound, Ionotophoresis 4mg /ml Dexamethasone, Manual therapy, and Re-evaluation.  PLAN FOR NEXT SESSION: progress strengthening as tolerated, manual therapy interventions  to decrease pain and improve range of motion, review/modify HEP as needed.   Sharalyn Ink Chaka Boyson PT, DPT, GCS  Rynlee Lisbon, PT 11/17/2022, 9:35 AM

## 2022-11-21 ENCOUNTER — Ambulatory Visit: Payer: Medicare Other

## 2022-11-21 DIAGNOSIS — M6281 Muscle weakness (generalized): Secondary | ICD-10-CM | POA: Diagnosis not present

## 2022-11-21 DIAGNOSIS — M25572 Pain in left ankle and joints of left foot: Secondary | ICD-10-CM | POA: Diagnosis not present

## 2022-11-24 ENCOUNTER — Ambulatory Visit: Payer: Medicare Other

## 2022-11-24 DIAGNOSIS — K080 Exfoliation of teeth due to systemic causes: Secondary | ICD-10-CM | POA: Diagnosis not present

## 2022-12-01 ENCOUNTER — Ambulatory Visit: Payer: Medicare Other

## 2022-12-01 DIAGNOSIS — G8929 Other chronic pain: Secondary | ICD-10-CM

## 2022-12-01 DIAGNOSIS — M6281 Muscle weakness (generalized): Secondary | ICD-10-CM

## 2022-12-01 DIAGNOSIS — M25572 Pain in left ankle and joints of left foot: Secondary | ICD-10-CM

## 2022-12-01 DIAGNOSIS — M25551 Pain in right hip: Secondary | ICD-10-CM

## 2022-12-01 NOTE — Therapy (Unsigned)
OUTPATIENT PHYSICAL THERAPY ANKLE TREATMENT  Patient Name: Brandy [name] MRN: [id number] DOB:01-23-1956, 67 y.o., female Today's Date: 12/03/2022  END OF SESSION:  PT End of Session - 12/03/22 2004     Visit Number 9    Number of Visits 17    Date for PT Re-Evaluation 12/19/22    Authorization Type eval: 10/24/22, BC[employer] Medicare 2024  [id number] on [place]  $10 copay  no auth req    PT Start Time 0937    PT Stop Time 1018    PT Time Calculation (min) 41 min    Activity Tolerance Patient tolerated treatment well    Behavior During Therapy [facility] for tasks assessed/performed            Past Medical History:  Diagnosis Date   Acute lower GI bleeding 01/19/2020   Arthritis    Atrial fibrillation with rapid ventricular response (HCC)    Chicken pox    Chronic systolic CHF (congestive heart failure) (HCC)    a. 03/2016 Echo: Ef 15-20%, diff HK, ant AK;  b. 05/2016 Echo: Ef 30-35%, diff HK, mildly dil LA/RA.   Diabetes mellitus without complication (HCC)    Dysrhythmia    GERD (gastroesophageal reflux disease)    Heart murmur    Hip discomfort    been going on for 40 years    History of kidney stones    Hyperlipidemia associated with type 2 diabetes mellitus (HCC) 05/12/2021   Hypertension    Moderate mitral regurgitation    a. 03/2016 Echo: mod MR in setting of LV dysfxn.   Motion sickness    car - back seat   NICM (nonischemic cardiomyopathy) (HCC)    a. 03/2016 Echo: EF 15-20%, diff HK, ant AK, mod MR, mod dil LA, mildly dil RA;  b. 04/2016 Cath: nl cors;  c. 05/2016 Echo: EF 30-35%, diff HK.   Obesity    Obstructive sleep apnea    compliant with CPAP   Persistent atrial fibrillation (HCC)    a. Dx 03/2016;  b. CHA2DS2VASc = 2-->Eliquis 5mg  BID;  b. 05/2016 Failed DCCV x 4.   Sleep apnea    Stomach irritation    Visit for monitoring Tikosyn therapy 07/24/2016   Past Surgical History:  Procedure Laterality Date   ATRIAL FIBRILLATION ABLATION N/A 12/09/2020   Procedure: ATRIAL  FIBRILLATION ABLATION;  Surgeon: Lanier Prude, MD;  Location: [facility];  Service: Cardiovascular;  Laterality: N/A;   BIOPSY N/A 01/12/2020   Procedure: BIOPSY;  Surgeon: Pasty Spillers, MD;  Location: [facility];  Service: Endoscopy;  Laterality: N/A;   CARDIAC CATHETERIZATION N/A 04/17/2016   Procedure: Left Heart Cath and Coronary Angiography;  Surgeon: Iran Ouch, MD;  Location: [facility] INVASIVE CV [facility];  Service: Cardiovascular;  Laterality: N/A;   CARDIOVERSION N/A 10/22/2020   Procedure: CARDIOVERSION;  Surgeon: Yvonne Kendall, MD;  Location: [facility] ORS;  Service: Cardiovascular;  Laterality: N/A;   CHOLECYSTECTOMY     COLON SURGERY  2021   COLONOSCOPY N/A 01/20/2020   Procedure: COLONOSCOPY;  Surgeon: Regis Bill, MD;  Location: [facility] ENDOSCOPY;  Service: Endoscopy;  Laterality: N/A;   COLONOSCOPY WITH PROPOFOL N/A 01/12/2020   Procedure: COLONOSCOPY WITH PROPOFOL;  Surgeon: Pasty Spillers, MD;  Location: [facility];  Service: Endoscopy;  Laterality: N/A;  Diabetic - oral meds sleep apnea   ELECTROPHYSIOLOGIC STUDY N/A 05/26/2016   Procedure: Cardioversion;  Surgeon: Iran Ouch, MD;  Location: [facility] ORS;  Service: Cardiovascular;  Laterality: N/A;   ESOPHAGOGASTRODUODENOSCOPY (EGD) WITH PROPOFOL N/A 01/12/2020   Procedure: ESOPHAGOGASTRODUODENOSCOPY (EGD) WITH PROPOFOL;  Surgeon: Pasty Spillers, MD;  Location: [facility];  Service: Endoscopy;  Laterality: N/A;   POLYPECTOMY N/A 01/12/2020   Procedure: POLYPECTOMY;  Surgeon: Pasty Spillers, MD;  Location: [facility];  Service: Endoscopy;  Laterality: N/A;   Patient Active Problem List   Diagnosis Date Noted   Peroneal tendinitis, left 10/13/2022   Left lumbar radiculopathy 07/26/2022   Arthralgia of left knee 07/26/2022   Pes anserinus bursitis of right knee 04/19/2022   Primary osteoarthritis of right knee 03/27/2022   Greater trochanteric pain  syndrome of right lower extremity 03/27/2022   It band syndrome, right 03/27/2022   Hyperlipidemia associated with type 2 diabetes mellitus (HCC) 05/12/2021   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   Varicose veins of leg with pain, bilateral 07/06/2020   Acquired thrombophilia (HCC) 05/24/2020   Type II diabetes mellitus with complication (HCC) 01/19/2020   Gastric polyp    Colon polyps    Varicose veins of both lower extremities 10/20/2019   Dysphagia 10/20/2019   Osteoarthritis of right hand 10/31/2016   Nonischemic cardiomyopathy (HCC) 04/14/2016   BMI 33.0-33.9,adult 04/14/2016    PCP: Bari Edward MD  REFERRING PROVIDER: Joseph Berkshire MD  REFERRING DIAG: [phone] (ICD-10-CM) - Peroneal tendinitis, left   Rationale for Evaluation and Treatment: Rehabilitation  THERAPY DIAG: Muscle weakness (generalized)  Pain in left ankle and joints of left foot  ONSET DATE: 09/26/22 (approximate)  FOLLOW-UP APPT SCHEDULED WITH REFERRING PROVIDER: No, but sees PCP 12/20/22  FROM INITIAL EVALUATION SUBJECTIVE:                                                                                                                                                                                         SUBJECTIVE STATEMENT:  L lateral ankle pain.   PERTINENT HISTORY:  Pt reports onset of L lateral ankle and base of 5th metatarsal pain after exercising in mid May 2024. No trauma reported however pt states that she had started performing cable resisted hip abduction as well as walking with weights. She has history of calcaneal bone spurs and has seen podiatry but no history of surgery to L foot/ankle. She saw [name] who encouraged her to restart diclofenac scheduled fpr 2 weeks and then dose as needed. She was also encouraged to use a lace up ASO brace which she has been wearing with improvement. She was noticing improvement in her symptoms until she stepped in a hole this past Saturday  and had a painful pop. She denies bruising or swelling afterwards. She  continued walking on her LLE and over the next 24 hours noted gradual improvement.  09/19/22: Left foot 3 views AP, lateral, lateral oblique: Dorsiflex ptosis present at the midfoot level over likely the second and third tarsometatarsal joints.  Slight pes cavus foot structure.  Osteoarthritic changes present at the second and third tarsometatarsal joints.  Posterior and plantar calcaneal heel spurs present.  Mild metatarsus adductus.   PAIN:    Pain Intensity: Present: 1/10, Best: 0/10, Worst: 10/10 Pain location: L lateral ankle posterior to lateral malleoli and into L foot.  Pain Quality: burning, sharp, and aching ("under ankle bone") Radiating: No  Swelling: No  Popping, catching, locking: Yes  Numbness/Tingling: No Focal Weakness: No but has a history of feeling chronic instability in L ankle; Aggravating factors: walking without brace, walking on uneven surfaces,  Relieving factors: ice, diclofenac, gabapentin, Tylenol; 24-hour pain behavior: worse at the end of the day; History of prior back, hip, knee, or ankle injury, pain, surgery, or therapy: No Dominant hand: right Imaging: Yes, see history; Typical footwear:  Red flags: Negative for personal history of cancer, chills/fever, night sweats, nausea, vomiting, unrelenting pain): Negative  PRECAUTIONS: None  WEIGHT BEARING RESTRICTIONS: No  FALLS: Has patient fallen in last 6 months? 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   Hobbies: Pottery (currently not limited but initially had trouble sitting at the wheel), gardening/yard work;  Patient Goals:  walking across the yard with improved confidence, lifting heavier objects at home without pain in hip, working the yard, "I would like to be able to get back to walking."   OBJECTIVE:   Patient Surveys  FOTO 38, predicted improvement to 26  Cognition Patient is oriented to person, place, and time.  Recent memory is intact.  Remote memory is intact.  Attention span and concentration are intact.  Expressive speech is intact.  Patient's fund of knowledge is within normal limits for educational level.    Gross Musculoskeletal Assessment Bulk: Normal Tone: Normal No trophic changes noted to foot/ankle. No ecchymosis, erythema, or edema noted.  Patient has a high resting arch with minimal arch collapse during gait  GAIT: Comments: No excessive pronation/supination noted during gait.  Minimal pronation noted.  Gait is antalgic with decreased step length on right lower extremity.  Posture: No gross deficits in the posture noted that are contributing to the left ankle pain.  AROM AROM (Normal range in degrees) AROM   Lumbar   Flexion (65) Moderate loss of segmental flexion  Extension (30) Mild loss  Right lateral flexion (25) Mild loss  Left lateral flexion (25) Mild loss  Right rotation (30) Mild loss  Left rotation (30) Mild loss   AROM (Normal range in degrees) AROM   Hip Right Left  Flexion (125)  WNL  Extension (15)    Abduction (40)  WNL  Adduction   WNL  Internal Rotation (45)  Mod/severe loss  External Rotation (45)  Mild loss      Knee    Flexion (135)  WNL (soft end feel)  Extension (0)  0      Ankle    Dorsiflexion (20) 18 18  Plantarflexion (50) 64 54  Inversion (35) 24  22  Eversion (15) 16 14  (* = pain; Blank rows = not tested)  LE MMT: MMT (out of 5) Right  Left   Hip flexion 5 5  Hip extension    Hip abduction 28.0# (measured at last DC) 26.7# (measured at last DC)  Hip adduction    Hip internal rotation 5 5  Hip external rotation 5 5  Knee  flexion 5 5  Knee extension 5 5  Ankle dorsiflexion 5 5*  Ankle plantarflexion 5 5*  Ankle inversion 5 5  Ankle eversion 5 5*  (* = pain; Blank rows = not tested)  Sensation Grossly intact to light touch throughout bilateral LEs as determined by testing dermatomes L2-S2. Proprioception, stereognosis, and hot/cold testing deferred on this date.  Palpation Generally painful to palpation around anterior and left lateral ankle.  Pain with palpation along peroneal muscles/tendons, at the base of the fifth metatarsal, and in the arch of the foot following the tendon of peroneus longus.  Pain to palpation at the sinus tarsus as well as over the deltoid ligament.  Painful palpation along distal end of fibula.  Passive Accessory Motion Superior Tibiofibular Joint: WNL Inferior Tibiofibular Joint: WNL Talocrural Joint Distraction: WNL Talocrural Joint AP: Hypomobile Talocrural Joint PA: WNL  VASCULAR Dorsalis pedis and posterior tibial pulses are palpable  SPECIAL TESTS Ligamentous Integrity Anterior Drawer (ATF, 10-15 plantarflexion with anterior translation): Negative Talar Tilt (CFL, inversion): Negative Eversion Stress Test (Deltoid, eversion): Negative External Rotation Test (High ankle, dorsiflexion and external rotation): Negative Squeeze Test (High ankle): Negative Impingment Sign (Dorsiflexion and eversion): Negative  Achilles Integrity Thompson Test: Negative  Fracture Screening Metatarsal Axial Loading: Negative Tap/Percussion Test: Negative Vibration Test: Not examined  Pronation/Supination Navicular Drop: Negative  Nerve Test Tarsal Tunnel Test (maximal DF, EV, toe ext with tapping over tarsal tunnel): Not examined Test for Morton's Neuroma (compress metatarsals and mobilize): Not examined  Other Windlass Mechanism Test: Not examined   Beighton Scale: Deferred   TODAY'S TREATMENT:   SUBJECTIVE: Pt reports that she is doing well today. She reports continued  improvement in her ankle pain and has noticed more pain in her arch recently. She reports 80-85% improvement in symptoms since starting with therapy. She arrives out of her lace-up ASO brace. HEP is going well. No specific questions currently.   PAIN: Denies L lateral ankle pain;   Manual Therapy  STM to left lower leg along peroneal muscles and tendons including posterior to the lateral malleolus, near the insertion of the peroneus brevis at the base of the fifth metatarsal, and along the transverse arch of the foot.  L ankle talocrural distraction, grade III, 30s/bout x 3 bouts; L ankle talocrural A/P mobilizations at available end range dorsiflexion, grade III, 30s/bout x 3 bouts; L ankle subtalar distraction, grade III, 30s/bout x 3 bouts;   Ther-ex  Isotonic eversion, inversion, dorsiflexion and plantarflexion 3 x 10 each; 6" step heel raises 2 x 10, pt encouraged to alter weight bearing % through LLE in order to perform with <3/10 pain; 6" step gastroc stretches 2 x 45s; Attempted L single leg Airex balance but too difficult; 1/2 foam roll tandem balance alternating forward LE 2 x 45s with each foot forward;   PATIENT EDUCATION:  Education details: Pt educated throughout session about proper posture and technique with exercises. Improved exercise technique, movement at target joints, use of target muscles after min to mod verbal, visual, tactile cues; Person educated: Patient Education method: Explanation, Demonstration, and Tactile cues  Education comprehension: verbalized understanding and returned demonstration   HOME EXERCISE PROGRAM:  Access Code: [email or web address] URL: [email or web address] Date: 11/17/2022 Prepared by: Ria Comment  Exercises - Isometric Ankle Eversion at Wall  - 2 x daily - 7 x weekly - 1 sets - 10 reps - 10s on/10s off hold - Isometric Ankle Inversion at Wall (Mirrored)  - 2 x daily - 7 x weekly - 1 sets - 10 reps - 10s on/10s off hold - Long  Sitting Calf Stretch with Strap  - 2 x daily - 7 x weekly - 3 reps - 30-45s hold - Standing Bilateral Heel Raise on Step  - 1 x daily - 7 x weekly - 2 sets - 10 reps - Single Leg Stance  - 1 x daily - 7 x weekly - 3 reps - 30s hold   ASSESSMENT:  CLINICAL IMPRESSION: Progressed manual therapy and strengthening with patient during session today. Continued isotonic L ankle exercises with additional resistance. Repeated standing heel raises. Utilized 1/2 foam roll for medial to lateral ankle stability in tandem stance. Deferred trigger point dry needling today given low pain and absence of trigger points identified during STM. Pt encouraged to continue her HEP and follow-up as scheduled. Pt will benefit from PT services to address deficits in range of motion and pain in order to return to full function at home, work, and when working in the yard with less pain.   OBJECTIVE IMPAIRMENTS: Abnormal gait, difficulty walking, decreased ROM, and pain.   ACTIVITY LIMITATIONS: squatting and stairs  PARTICIPATION LIMITATIONS: community activity, occupation, and yard work  PERSONAL FACTORS: Age, Past/current experiences, and 3+ comorbidities: DMII, lumbar radiculopathy, and bilateral knee OA  are also affecting patient's functional outcome.   REHAB POTENTIAL: Excellent  CLINICAL DECISION MAKING: Stable/uncomplicated  EVALUATION COMPLEXITY: Low   GOALS: Goals reviewed with patient? No  SHORT TERM GOALS: Target date: 11/21/2022  Pt will be independent with HEP to improve strength and decrease ankle pain to improve pain-free function at home and work. Baseline:  Goal status: INITIAL   LONG TERM GOALS: Target date: 12/19/2022  Pt will increase FOTO to at least 60 to demonstrate significant improvement in function at home and work related to ankle pain  Baseline: 10/24/22: 38 Goal status: INITIAL  2.  Pt will decrease worst ankle pain by at least 3 points on the NPRS in order to demonstrate  clinically significant reduction in ankle pain. Baseline: 10/24/22: worst: 10/10; Goal status: INITIAL  3.  Pt will report at least 75% improvement in symptoms in order demonstrate clinically significant reduction in ankle pain/disability.       Baseline: 12/01/22: 80-85% improvement Goal status: ACHIEVED  4.  Pt will be able to bend, squat, and walk across an uneven yard without an increase in her L ankle pain in order to resume working in her yard/garden as well as return to walking for exercise with less pain Baseline: 10/24/22: Notable painful in L ankle Goal status: INITIAL   PLAN: PT FREQUENCY: 1-2x/week  PT DURATION: 8 weeks  PLANNED INTERVENTIONS: Therapeutic exercises, Therapeutic activity, Neuromuscular re-education, Balance training, Gait training, Patient/Family education, Self Care, Joint mobilization, Joint manipulation, Vestibular training, Canalith repositioning, Orthotic/Fit training, DME instructions, Dry Needling, Electrical stimulation, Spinal manipulation, Spinal mobilization, Cryotherapy, Moist heat, Taping, Traction, Ultrasound, Ionotophoresis 4mg /ml Dexamethasone, Manual therapy, and Re-evaluation.  PLAN FOR NEXT SESSION: progress strengthening as tolerated, manual therapy interventions to decrease pain and improve range of motion, review/modify HEP as needed.   Barbara Cower  D Zaila Crew PT, DPT, GCS  Emmanuella Mirante, PT 12/03/2022, 8:07 PM

## 2022-12-04 NOTE — Progress Notes (Signed)
Care[facility] Summary Report / Loop Recorder 

## 2022-12-05 ENCOUNTER — Ambulatory Visit: Payer: Medicare Other

## 2022-12-06 ENCOUNTER — Ambulatory Visit (INDEPENDENT_AMBULATORY_CARE_PROVIDER_SITE_OTHER): Payer: Medicare Other

## 2022-12-06 VITALS — Ht 67.0 in | Wt 232.0 lb

## 2022-12-06 DIAGNOSIS — Z0000 Encounter for general adult medical examination without abnormal findings: Secondary | ICD-10-CM

## 2022-12-06 NOTE — Patient Instructions (Signed)
Brandy [name] , Thank you for taking time to come for your Medicare Wellness Visit. I appreciate your ongoing commitment to your health goals. Please review the following plan we discussed and let me know if I can assist you in the future.   These are the goals we discussed:  Goals      DIET - EAT MORE FRUITS AND VEGETABLES     Weight (lb) < 190 lb (86.2 kg)     Pt states she would like to lose weight with healthy eating and physical activity         This is a list of the screening recommended for you and due dates:  Health Maintenance  Topic Date Due   DTaP/Tdap/Td vaccine (2 - Tdap) 12/11/2011   COVID-19 Vaccine (4 - 2023-24 season) 01/13/2022   Mammogram  05/11/2022   Colon Cancer Screening  05/31/2022   Flu Shot  12/14/2022   Eye exam for diabetics  02/11/2023   Hemoglobin A1C  03/08/2023   Complete foot exam   09/06/2023   Yearly kidney function blood test for diabetes  10/24/2023   Yearly kidney health urinalysis for diabetes  10/24/2023   Medicare Annual Wellness Visit  12/06/2023   Pneumonia Vaccine  Completed   DEXA scan (bone density measurement)  Completed   Hepatitis C Screening  Completed   Zoster (Shingles) Vaccine  Completed   HPV Vaccine  Aged Out    Advanced directives: no  Conditions/risks identified: none  Next appointment: Follow up in one year for your annual wellness visit 12/12/23 @ 2:30 pm by phone   Preventive Care 65 Years and Older, Female Preventive care refers to lifestyle choices and visits with your health care provider that can promote health and wellness. What does preventive care include? A yearly physical exam. This is also called an annual well check. Dental exams once or twice a year. Routine eye exams. Ask your health care provider how often you should have your eyes checked. Personal lifestyle choices, including: Daily care of your teeth and gums. Regular physical activity. Eating a healthy diet. Avoiding tobacco and drug  use. Limiting alcohol use. Practicing safe sex. Taking low-dose aspirin every day. Taking vitamin and mineral supplements as recommended by your health care provider. What happens during an annual well check? The services and screenings done by your health care provider during your annual well check will depend on your age, overall health, lifestyle risk factors, and family history of disease. Counseling  Your health care provider may ask you questions about your: Alcohol use. Tobacco use. Drug use. Emotional well-being. Home and relationship well-being. Sexual activity. Eating habits. History of falls. Memory and ability to understand (cognition). Work and work Astronomer. Reproductive health. Screening  You may have the following tests or measurements: Height, weight, and BMI. Blood pressure. Lipid and cholesterol levels. These may be checked every 5 years, or more frequently if you are over 40 years old. Skin check. Lung cancer screening. You may have this screening every year starting at age 73 if you have a 30-pack-year history of smoking and currently smoke or have quit within the past 15 years. Fecal occult blood test (FOBT) of the stool. You may have this test every year starting at age 45. Flexible sigmoidoscopy or colonoscopy. You may have a sigmoidoscopy every 5 years or a colonoscopy every 10 years starting at age 81. Hepatitis C blood test. Hepatitis B blood test. Sexually transmitted disease (STD) testing. Diabetes screening. This is done by  checking your blood sugar (glucose) after you have not eaten for a while (fasting). You may have this done every 1-3 years. Bone density scan. This is done to screen for osteoporosis. You may have this done starting at age 48. Mammogram. This may be done every 1-2 years. Talk to your health care provider about how often you should have regular mammograms. Talk with your health care provider about your test results, treatment  options, and if necessary, the need for more tests. Vaccines  Your health care provider may recommend certain vaccines, such as: Influenza vaccine. This is recommended every year. Tetanus, diphtheria, and acellular pertussis (Tdap, Td) vaccine. You may need a Td booster every 10 years. Zoster vaccine. You may need this after age 70. Pneumococcal 13-valent conjugate (PCV13) vaccine. One dose is recommended after age 45. Pneumococcal polysaccharide (PPSV23) vaccine. One dose is recommended after age 21. Talk to your health care provider about which screenings and vaccines you need and how often you need them. This information is not intended to replace advice given to you by your health care provider. Make sure you discuss any questions you have with your health care provider. Document Released: 05/28/2015 Document Revised: 01/19/2016 Document Reviewed: 03/02/2015 Elsevier Interactive Patient Education  2017 [employer].  Fall Prevention in the Home Falls can cause injuries. They can happen to people of all ages. There are many things you can do to make your home safe and to help prevent falls. What can I do on the outside of my home? Regularly fix the edges of walkways and driveways and fix any cracks. Remove anything that might make you trip as you walk through a door, such as a raised step or threshold. Trim any bushes or trees on the path to your home. Use bright outdoor lighting. Clear any walking paths of anything that might make someone trip, such as rocks or tools. Regularly check to see if handrails are loose or broken. Make sure that both sides of any steps have handrails. Any raised decks and porches should have guardrails on the edges. Have any leaves, snow, or ice cleared regularly. Use sand or salt on walking paths during winter. Clean up any spills in your garage right away. This includes oil or grease spills. What can I do in the bathroom? Use night lights. Install grab  bars by the toilet and in the tub and shower. Do not use towel bars as grab bars. Use non-skid mats or decals in the tub or shower. If you need to sit down in the shower, use a plastic, non-slip stool. Keep the floor dry. Clean up any water that spills on the floor as soon as it happens. Remove soap buildup in the tub or shower regularly. Attach bath mats securely with double-sided non-slip rug tape. Do not have throw rugs and other things on the floor that can make you trip. What can I do in the bedroom? Use night lights. Make sure that you have a light by your bed that is easy to reach. Do not use any sheets or blankets that are too big for your bed. They should not hang down onto the floor. Have a firm chair that has side arms. You can use this for support while you get dressed. Do not have throw rugs and other things on the floor that can make you trip. What can I do in the kitchen? Clean up any spills right away. Avoid walking on wet floors. Keep items that you use a  lot in easy-to-reach places. If you need to reach something above you, use a strong step stool that has a grab [name]. Keep electrical cords out of the way. Do not use floor polish or wax that makes floors slippery. If you must use wax, use non-skid floor wax. Do not have throw rugs and other things on the floor that can make you trip. What can I do with my stairs? Do not leave any items on the stairs. Make sure that there are handrails on both sides of the stairs and use them. Fix handrails that are broken or loose. Make sure that handrails are as long as the stairways. Check any carpeting to make sure that it is firmly attached to the stairs. Fix any carpet that is loose or worn. Avoid having throw rugs at the top or bottom of the stairs. If you do have throw rugs, attach them to the floor with carpet tape. Make sure that you have a light switch at the top of the stairs and the bottom of the stairs. If you do not have them,  ask someone to add them for you. What else can I do to help prevent falls? Wear shoes that: Do not have high heels. Have rubber bottoms. Are comfortable and fit you well. Are closed at the toe. Do not wear sandals. If you use a stepladder: Make sure that it is fully opened. Do not climb a closed stepladder. Make sure that both sides of the stepladder are locked into place. Ask someone to hold it for you, if possible. Clearly mark and make sure that you can see: Any grab bars or handrails. First and last steps. Where the edge of each step is. Use tools that help you move around (mobility aids) if they are needed. These include: Canes. Walkers. Scooters. Crutches. Turn on the lights when you go into a dark area. Replace any light bulbs as soon as they burn out. Set up your furniture so you have a clear path. Avoid moving your furniture around. If any of your floors are uneven, fix them. If there are any pets around you, be aware of where they are. Review your medicines with your doctor. Some medicines can make you feel dizzy. This can increase your chance of falling. Ask your doctor what other things that you can do to help prevent falls. This information is not intended to replace advice given to you by your health care provider. Make sure you discuss any questions you have with your health care provider. Document Released: 02/25/2009 Document Revised: 10/07/2015 Document Reviewed: 06/05/2014 Elsevier Interactive Patient Education  2017 [employer].

## 2022-12-06 NOTE — Progress Notes (Signed)
Subjective:   Brandy [name] is a 67 y.o. female who presents for Medicare Annual (Subsequent) preventive examination.  Per patient no change in vitals since last visit; unable to obtain new vitals due to this being a telehealth visit. 12/06/22 Patient was unable to self-report vital signs via telehealth due to a lack of equipment at home.   Visit Complete: Virtual  I connected with  [name] on 12/06/22 by a audio enabled telemedicine application and verified that I am speaking with the correct person using two identifiers.  Patient Location: Home  Provider Location: Office/Clinic  I discussed the limitations of evaluation and management by telemedicine. The patient expressed understanding and agreed to proceed.   Review of Systems      Cardiac Risk Factors include: advanced age (>105men, >83 women);diabetes mellitus;hypertension;dyslipidemia;obesity (BMI >30kg/m2)     Objective:    Today's Vitals   12/06/22 1552  Weight: 232 lb (105.2 kg)  Height: 5\' 7"  (1.702 m)   Body mass index is 36.34 kg/m.     12/06/2022    3:41 PM 09/26/2021    3:46 PM 04/18/2021    1:15 PM 12/09/2020    5:44 AM 09/22/2020    3:37 PM 01/20/2020    2:57 PM 01/19/2020    7:00 PM  Advanced Directives  Does Patient Have a Medical Advance Directive? Yes Yes No Yes Yes Yes Yes  Type of Estate agent of [place];Living will Healthcare Power of [place];Living will  Healthcare Power of [place];Living will Healthcare Power of [place];Living will Healthcare Power of [employer] of [place];Living will  Does patient want to make changes to medical advance directive? No - Patient declined   No - Patient declined   No - Patient declined  Copy of Healthcare Power of Attorney in Chart? Yes - validated most recent copy scanned in chart (See row information) Yes - validated most recent copy scanned in chart (See row information)  No - copy requested Yes - validated most recent copy  scanned in chart (See row information)  No - copy requested    Current Medications (verified) Outpatient Encounter Medications as of 12/06/2022  Medication Sig   CALCIUM PO Take by mouth daily.   Celery Seed OIL Using as needed to help with constipation   Cholecalciferol (VITAMIN D3) 1000 units CAPS Take 1,000 Units by mouth 2 (two) times daily.   diclofenac (VOLTAREN) 50 MG EC tablet Take 1 tablet by mouth twice daily as needed   furosemide (LASIX) 20 MG tablet Take 1 tablet (20 mg total) by mouth daily as needed for fluid or edema.   gabapentin (NEURONTIN) 100 MG capsule TAKE 1 TO 3 CAPSULES BY MOUTH AT BEDTIME   Lancets (ONETOUCH DELICA PLUS LANCET33G) MISC USE  1 TO CHECK GLUCOSE UP TO 4 TIMES DAILY AS DIRECTED   Lavender Oil OIL Apply 1 application topically daily as needed (stress).   Lemon Oil OIL Take 1 application by mouth daily as needed (add to water).   Lemongrass Oil OIL Apply 1 application topically daily as needed (multiple uses).   Magnesium Bisglycinate (MAG GLYCINATE) 100 MG TABS Take 270 mg by mouth in the morning, at noon, and at bedtime.   metFORMIN (GLUCOPHAGE-XR) 500 MG 24 hr tablet Take 1/2 (one-half) tablet by mouth twice daily   metoprolol tartrate (LOPRESSOR) 50 MG tablet Take 1/2 (one-half) tablet by mouth twice daily   Multiple Vitamins-Minerals (MULTIVITAMIN WITH MINERALS) tablet Take 1 tablet by mouth daily.  Oil Base OIL Breathe oil as needed for congestion   OIL OF OREGANO PO Uses as needed   ONETOUCH VERIO test strip USE 1 STRIP TO CHECK GLUCOSE UP TO 4 TIMES DAILY AS  DIRECTED   peppermint oil liquid Apply 1 application topically daily as needed (itchy/ cold symptoms).   Tea Tree Oil OIL Apply 1 application topically daily as needed (fungus).   vitamin B-12 (CYANOCOBALAMIN) 500 MCG tablet Take 500 mcg by mouth daily.   vitamin C (ASCORBIC ACID) 500 MG tablet Take 500 mg by mouth daily.    vitamin E 45 MG (100 UNITS) capsule Take by mouth daily.   No  facility-administered encounter medications on file as of 12/06/2022.    Allergies (verified) Patient has no known allergies.   History: Past Medical History:  Diagnosis Date   Acute lower GI bleeding 01/19/2020   Arthritis    Atrial fibrillation with rapid ventricular response (HCC)    Chicken pox    Chronic systolic CHF (congestive heart failure) (HCC)    a. 03/2016 Echo: Ef 15-20%, diff HK, ant AK;  b. 05/2016 Echo: Ef 30-35%, diff HK, mildly dil LA/RA.   Diabetes mellitus without complication (HCC)    Dysrhythmia    GERD (gastroesophageal reflux disease)    Heart murmur    Hip discomfort    been going on for 40 years    History of kidney stones    Hyperlipidemia associated with type 2 diabetes mellitus (HCC) 05/12/2021   Hypertension    Moderate mitral regurgitation    a. 03/2016 Echo: mod MR in setting of LV dysfxn.   Motion sickness    car - back seat   NICM (nonischemic cardiomyopathy) (HCC)    a. 03/2016 Echo: EF 15-20%, diff HK, ant AK, mod MR, mod dil LA, mildly dil RA;  b. 04/2016 Cath: nl cors;  c. 05/2016 Echo: EF 30-35%, diff HK.   Obesity    Obstructive sleep apnea    compliant with CPAP   Persistent atrial fibrillation (HCC)    a. Dx 03/2016;  b. CHA2DS2VASc = 2-->Eliquis 5mg  BID;  b. 05/2016 Failed DCCV x 4.   Sleep apnea    Stomach irritation    Visit for monitoring Tikosyn therapy 07/24/2016   Past Surgical History:  Procedure Laterality Date   ATRIAL FIBRILLATION ABLATION N/A 12/09/2020   Procedure: ATRIAL FIBRILLATION ABLATION;  Surgeon: Lanier Prude, MD;  Location: [facility];  Service: Cardiovascular;  Laterality: N/A;   BIOPSY N/A 01/12/2020   Procedure: BIOPSY;  Surgeon: Pasty Spillers, MD;  Location: [facility];  Service: Endoscopy;  Laterality: N/A;   CARDIAC CATHETERIZATION N/A 04/17/2016   Procedure: Left Heart Cath and Coronary Angiography;  Surgeon: Iran Ouch, MD;  Location: [facility] INVASIVE CV LAB;  Service:  Cardiovascular;  Laterality: N/A;   CARDIOVERSION N/A 10/22/2020   Procedure: CARDIOVERSION;  Surgeon: Yvonne Kendall, MD;  Location: [facility] ORS;  Service: Cardiovascular;  Laterality: N/A;   CHOLECYSTECTOMY     COLON SURGERY  2021   COLONOSCOPY N/A 01/20/2020   Procedure: COLONOSCOPY;  Surgeon: Regis Bill, MD;  Location: [facility] ENDOSCOPY;  Service: Endoscopy;  Laterality: N/A;   COLONOSCOPY WITH PROPOFOL N/A 01/12/2020   Procedure: COLONOSCOPY WITH PROPOFOL;  Surgeon: Pasty Spillers, MD;  Location: [facility];  Service: Endoscopy;  Laterality: N/A;  Diabetic - oral meds sleep apnea   ELECTROPHYSIOLOGIC STUDY N/A 05/26/2016   Procedure: Cardioversion;  Surgeon: Iran Ouch, MD;  Location: [facility] ORS;  Service: Cardiovascular;  Laterality: N/A;   ESOPHAGOGASTRODUODENOSCOPY (EGD) WITH PROPOFOL N/A 01/12/2020   Procedure: ESOPHAGOGASTRODUODENOSCOPY (EGD) WITH PROPOFOL;  Surgeon: Pasty Spillers, MD;  Location: [facility];  Service: Endoscopy;  Laterality: N/A;   POLYPECTOMY N/A 01/12/2020   Procedure: POLYPECTOMY;  Surgeon: Pasty Spillers, MD;  Location: [facility];  Service: Endoscopy;  Laterality: N/A;   Family History  Problem Relation Age of Onset   COPD Mother    Anxiety disorder Mother    Arthritis Mother    Asthma Mother    Depression Mother    COPD Father    Heart disease Father    Hypertension Father    Arthritis Father    Varicose Veins Father    Diabetes Paternal Aunt    Cancer Maternal Aunt    Social History   Socioeconomic History   Marital status: Widowed    Spouse name: Deceased   Number of children: 1   Years of education: Not on file   Highest education level: Bachelor's degree (e.g., BA, AB, BS)  Occupational History   Not on file  Tobacco Use   Smoking status: Never   Smokeless tobacco: Never  Vaping Use   Vaping status: Never Used  Substance and Sexual Activity   Alcohol use: Never   Drug use: Never    Sexual activity: Not Currently    Partners: Male  Other Topics Concern   Not on file  Social History Narrative   1 by birth 2 adopted   Pt lives alone   Social Determinants of Health   Financial Resource Strain: Low Risk  (12/06/2022)   Overall Financial Resource Strain (CARDIA)    Difficulty of Paying Living Expenses: Not hard at all  Food Insecurity: No Food Insecurity (12/06/2022)   Hunger Vital Sign    Worried About Running Out of Food in the Last Year: Never true    Ran Out of Food in the Last Year: Never true  Transportation Needs: No Transportation Needs (12/06/2022)   PRAPARE - Administrator, Civil Service (Medical): No    Lack of Transportation (Non-Medical): No  Physical Activity: Insufficiently Active (12/06/2022)   Exercise Vital Sign    Days of Exercise per Week: 3 days    Minutes of Exercise per Session: 40 min  Stress: No Stress Concern Present (12/06/2022)   [employer] of Occupational Health - Occupational Stress Questionnaire    Feeling of Stress : Only a little  Social Connections: Moderately Integrated (12/06/2022)   Social Connection and Isolation Panel [NHANES]    Frequency of Communication with Friends and Family: More than three times a week    Frequency of Social Gatherings with Friends and Family: Twice a week    Attends Religious Services: More than 4 times per year    Active Member of [employer] or Organizations: Yes    Attends Banker Meetings: More than 4 times per year    Marital Status: Widowed    Tobacco Counseling Counseling given: Not Answered   Clinical Intake:  Pre-visit preparation completed: Yes  Pain : No/denies pain     Nutritional Risks: None Diabetes: Yes CBG done?: No Did pt. bring in CBG monitor from home?: No  How often do you need to have someone help you when you read instructions, pamphlets, or other written materials from your doctor or pharmacy?: 1 - Never  Interpreter Needed?:  No  Information entered by :: Kennedy Bucker, LPN  Activities of Daily Living    12/06/2022    3:41 PM  In your present state of health, do you have any difficulty performing the following activities:  Hearing? 0  Vision? 0  Difficulty concentrating or making decisions? 0  Walking or climbing stairs? 1  Comment ankle, knees  Dressing or bathing? 0  Doing errands, shopping? 0  Preparing Food and eating ? N  Using the Toilet? N  In the past six months, have you accidently leaked urine? N  Do you have problems with loss of bowel control? N  Managing your Medications? N  Managing your Finances? N  Housekeeping or managing your Housekeeping? N    Patient Care Team: Reubin Milan, MD as PCP - General (Internal Medicine) Duke Salvia, MD as Consulting Physician (Cardiology) Mia [place] Rossie Muskrat, MD as Consulting Physician (Gastroenterology) Eli Phillips, OD (Optometry)  Indicate any recent Medical Services you may have received from other than [facility] providers in the past year (date may be approximate).     Assessment:   This is a routine wellness examination for [facility].  Hearing/Vision screen Hearing Screening - Comments:: No aids Vision Screening - Comments:: Wears glasses- Dr.Katsoudas  Dietary issues and exercise activities discussed:     Goals Addressed             This Visit's Progress    DIET - EAT MORE FRUITS AND VEGETABLES         Depression Screen    12/06/2022    3:39 PM 09/06/2022   10:35 AM 03/20/2022    9:35 AM 03/10/2022   11:15 AM 10/28/2021    1:36 PM 09/26/2021    3:45 PM 08/01/2021    2:52 PM  PHQ 2/9 Scores  PHQ - 2 Score 2 2 0 2 2 0 0  PHQ- 9 Score 4 8 1 6 4 1 4     Fall Risk    12/06/2022    3:41 PM 09/06/2022   10:35 AM 03/20/2022    9:35 AM 03/10/2022   11:15 AM 10/28/2021    1:36 PM  Fall Risk   Falls in the past year? 0 0 0 0 0  Number falls in past yr: 0 0 0 0 0  Injury with Fall? 0 0 0 0 0  Risk for fall due to : No Fall  Risks No Fall Risks No Fall Risks No Fall Risks No Fall Risks  Follow up Falls prevention discussed;Falls evaluation completed Falls evaluation completed Falls evaluation completed Falls evaluation completed Falls evaluation completed    MEDICARE RISK AT HOME:  Medicare Risk at Home - 12/06/22 1542     Any stairs in or around the home? Yes    If so, are there any without handrails? No    Home free of loose throw rugs in walkways, pet beds, electrical cords, etc? No    Adequate lighting in your home to reduce risk of falls? Yes    Life alert? No    Use of a cane, walker or w/c? Yes   cane at times   Grab bars in the bathroom? Yes    Shower chair or bench in shower? No    Elevated toilet seat or a handicapped toilet? No             TIMED UP AND GO:  Was the test performed?  No    Cognitive Function:        12/06/2022    3:43 PM  6CIT Screen  What Year? 0 points  What month? 0 points  What time? 0 points  Count back from 20 0 points  Months in reverse 0 points  Repeat phrase 2 points  Total Score 2 points    Immunizations Immunization History  Administered Date(s) Administered   PFIZER(Purple Top)SARS-COV-2 Vaccination 02/06/2020, 02/27/2020, 07/29/2020   PNEUMOCOCCAL CONJUGATE-20 02/22/2021   Pneumococcal Conjugate-13 10/24/2019   Td 12/10/2001   Td (Adult), 2 Lf Tetanus Toxid, Preservative Free 12/10/2001   Zoster Recombinant(Shingrix) 06/10/2021, 11/07/2021    TDAP status: Due, Education has been provided regarding the importance of this vaccine. Advised may receive this vaccine at local pharmacy or Health Dept. Aware to provide a copy of the vaccination record if obtained from local pharmacy or Health Dept. Verbalized acceptance and understanding.  Flu Vaccine status: Declined, Education has been provided regarding the importance of this vaccine but patient still declined. Advised may receive this vaccine at local pharmacy or Health Dept. Aware to provide a  copy of the vaccination record if obtained from local pharmacy or Health Dept. Verbalized acceptance and understanding.  Pneumococcal vaccine status: Up to date  Covid-19 vaccine status: Completed vaccines  Qualifies for Shingles Vaccine? Yes   Zostavax completed No   Shingrix Completed?: Yes  Screening Tests Health Maintenance  Topic Date Due   DTaP/Tdap/Td (2 - Tdap) 12/11/2011   COVID-19 Vaccine (4 - 2023-24 season) 01/13/2022   MAMMOGRAM  05/11/2022   Colonoscopy  05/31/2022   INFLUENZA VACCINE  12/14/2022   OPHTHALMOLOGY EXAM  02/11/2023   HEMOGLOBIN A1C  03/08/2023   FOOT EXAM  09/06/2023   Diabetic kidney evaluation - eGFR measurement  10/24/2023   Diabetic kidney evaluation - Urine ACR  10/24/2023   Medicare Annual Wellness (AWV)  12/06/2023   Pneumonia Vaccine 58+ Years old  Completed   DEXA SCAN  Completed   Hepatitis C Screening  Completed   Zoster Vaccines- Shingrix  Completed   HPV VACCINES  Aged Out    Health Maintenance  Health Maintenance Due  Topic Date Due   DTaP/Tdap/Td (2 - Tdap) 12/11/2011   COVID-19 Vaccine (4 - 2023-24 season) 01/13/2022   MAMMOGRAM  05/11/2022   Colonoscopy  05/31/2022    Colorectal cancer screening: Type of screening: Colonoscopy. Completed 05/31/21. Repeat every 3 years  Mammogram status: Completed 05/11/21. Repeat every year- every other year  Bone Density status: Completed 05/11/21. Results reflect: Bone density results: OSTEOPENIA. Repeat every 3 years.  Lung Cancer Screening: (Low Dose CT Chest recommended if Age 55-80 years, 20 pack-year currently smoking OR have quit w/in 15years.) does not qualify.   Additional Screening:  Hepatitis C Screening: does qualify; Completed 10/31/16  Vision Screening: Recommended annual ophthalmology exams for early detection of glaucoma and other disorders of the eye. Is the patient up to date with their annual eye exam?  Yes  Who is the provider or what is the name of the office in  which the patient attends annual eye exams? Dr.Katsoudas If pt is not established with a provider, would they like to be referred to a provider to establish care? No .   Dental Screening: Recommended annual dental exams for proper oral hygiene  Diabetic Foot Exam: Diabetic Foot Exam: Completed 09/06/22  Community Resource Referral / Chronic Care Management: CRR required this visit?  No   CCM required this visit?  No     Plan:     I have personally reviewed and noted the following in the patient's chart:   Medical  and social history Use of alcohol, tobacco or illicit drugs  Current medications and supplements including opioid prescriptions. Patient is not currently taking opioid prescriptions. Functional ability and status Nutritional status Physical activity Advanced directives List of other physicians Hospitalizations, surgeries, and ER visits in previous 12 months Vitals Screenings to include cognitive, depression, and falls Referrals and appointments  In addition, I have reviewed and discussed with patient certain preventive protocols, quality metrics, and best practice recommendations. A written personalized care plan for preventive services as well as general preventive health recommendations were provided to patient.     Hal Hope, LPN   9/60/4540   After Visit Summary: (MyChart) Due to this being a telephonic visit, the after visit summary with patients personalized plan was offered to patient via MyChart   Nurse Notes: none

## 2022-12-08 ENCOUNTER — Ambulatory Visit: Payer: Medicare Other

## 2022-12-08 DIAGNOSIS — M25572 Pain in left ankle and joints of left foot: Secondary | ICD-10-CM

## 2022-12-08 DIAGNOSIS — M6281 Muscle weakness (generalized): Secondary | ICD-10-CM

## 2022-12-14 NOTE — Therapy (Signed)
OUTPATIENT PHYSICAL THERAPY ANKLE TREATMENT/PROGRESS NOTE  Dates of reporting period  10/24/22   to   12/15/22   Patient Name: Brandy [name] MRN: [id number] DOB:Sep 14, 1955, 67 y.o., female Today's Date: 12/15/2022  END OF SESSION:  PT End of Session - 12/15/22 0934     Visit Number 10    Number of Visits 17    Date for PT Re-Evaluation 12/19/22    Authorization Type eval: 10/24/22, BCBS Medicare 2024  [place]  $10 cop[place]  no auth req    PT Start Time 0930    PT Stop Time 1015    PT Time Calculation (min) 45 min    Activity Tolerance Patient tolerated treatment well    Behavior During Therapy [facility] for tasks assessed/performed             Past Medical History:  Diagnosis Date   Acute lower GI bleeding 01/19/2020   Arthritis    Atrial fibrillation with rapid ventricular response (HCC)    Chicken pox    Chronic systolic CHF (congestive heart failure) (HCC)    a. 03/2016 Echo: Ef 15-20%, diff HK, ant AK;  b. 05/2016 Echo: Ef 30-35%, diff HK, mildly dil LA/RA.   Diabetes mellitus without complication (HCC)    Dysrhythmia    GERD (gastroesophageal reflux disease)    Heart murmur    Hip discomfort    been going on for 40 years    History of kidney stones    Hyperlipidemia associated with type 2 diabetes mellitus (HCC) 05/12/2021   Hypertension    Moderate mitral regurgitation    a. 03/2016 Echo: mod MR in setting of LV dysfxn.   Motion sickness    car - back seat   NICM (nonischemic cardiomyopathy) (HCC)    a. 03/2016 Echo: EF 15-20%, diff HK, ant AK, mod MR, mod dil LA, mildly dil RA;  b. 04/2016 Cath: nl cors;  c. 05/2016 Echo: EF 30-35%, diff HK.   Obesity    Obstructive sleep apnea    compliant with CPAP   Persistent atrial fibrillation (HCC)    a. Dx 03/2016;  b. CHA2DS2VASc = 2-->Eliquis 5mg  BID;  b. 05/2016 Failed DCCV x 4.   Sleep apnea    Stomach irritation    Visit for monitoring Tikosyn therapy 07/24/2016   Past Surgical History:  Procedure Laterality Date    ATRIAL FIBRILLATION ABLATION N/A 12/09/2020   Procedure: ATRIAL FIBRILLATION ABLATION;  Surgeon: Lanier Prude, MD;  Location: [facility];  Service: Cardiovascular;  Laterality: N/A;   BIOPSY N/A 01/12/2020   Procedure: BIOPSY;  Surgeon: Pasty Spillers, MD;  Location: [facility];  Service: Endoscopy;  Laterality: N/A;   CARDIAC CATHETERIZATION N/A 04/17/2016   Procedure: Left Heart Cath and Coronary Angiography;  Surgeon: Iran Ouch, MD;  Location: [facility] INVASIVE CV LAB;  Service: Cardiovascular;  Laterality: N/A;   CARDIOVERSION N/A 10/22/2020   Procedure: CARDIOVERSION;  Surgeon: Yvonne Kendall, MD;  Location: [facility] ORS;  Service: Cardiovascular;  Laterality: N/A;   CHOLECYSTECTOMY     COLON SURGERY  2021   COLONOSCOPY N/A 01/20/2020   Procedure: COLONOSCOPY;  Surgeon: Regis Bill, MD;  Location: [facility] ENDOSCOPY;  Service: Endoscopy;  Laterality: N/A;   COLONOSCOPY WITH PROPOFOL N/A 01/12/2020   Procedure: COLONOSCOPY WITH PROPOFOL;  Surgeon: Pasty Spillers, MD;  Location: [facility];  Service: Endoscopy;  Laterality: N/A;  Diabetic - oral meds sleep apnea   ELECTROPHYSIOLOGIC STUDY N/A 05/26/2016  Procedure: Cardioversion;  Surgeon: Iran Ouch, MD;  Location: [facility] ORS;  Service: Cardiovascular;  Laterality: N/A;   ESOPHAGOGASTRODUODENOSCOPY (EGD) WITH PROPOFOL N/A 01/12/2020   Procedure: ESOPHAGOGASTRODUODENOSCOPY (EGD) WITH PROPOFOL;  Surgeon: Pasty Spillers, MD;  Location: [facility];  Service: Endoscopy;  Laterality: N/A;   POLYPECTOMY N/A 01/12/2020   Procedure: POLYPECTOMY;  Surgeon: Pasty Spillers, MD;  Location: [facility];  Service: Endoscopy;  Laterality: N/A;   Patient Active Problem List   Diagnosis Date Noted   Peroneal tendinitis, left 10/13/2022   Left lumbar radiculopathy 07/26/2022   Arthralgia of left knee 07/26/2022   Pes anserinus bursitis of right knee 04/19/2022   Primary  osteoarthritis of right knee 03/27/2022   Greater 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 03/27/2022   It band syndrome, right 03/27/2022   Hyperlipidemia associated with type 2 diabetes mellitus (HCC) 05/12/2021   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   Varicose veins of leg with pain, bilateral 07/06/2020   Acquired thrombophilia (HCC) 05/24/2020   Type II diabetes mellitus with complication (HCC) 01/19/2020   Gastric polyp    Colon polyps    Varicose veins of both lower extremities 10/20/2019   Dysphagia 10/20/2019   Osteoarthritis of right hand 10/31/2016   Nonischemic cardiomyopathy (HCC) 04/14/2016   BMI 33.0-33.9,adult 04/14/2016    PCP: Bari Edward MD  REFERRING PROVIDER: Joseph Berkshire MD  REFERRING DIAG: [phone] (ICD-10-CM) - Peroneal tendinitis, left   Rationale for Evaluation and Treatment: Rehabilitation  THERAPY DIAG: Pain in left ankle and joints of left foot  Muscle weakness (generalized)  ONSET DATE: 09/26/22 (approximate)  FOLLOW-UP APPT SCHEDULED WITH REFERRING PROVIDER: No, but sees PCP 12/20/22  FROM INITIAL EVALUATION SUBJECTIVE:                                                                                                                                                                                         SUBJECTIVE STATEMENT:  L lateral ankle pain.   PERTINENT HISTORY:  Pt reports onset of L lateral ankle and base of 5th metatarsal pain after exercising in mid May 2024. No trauma reported however pt states that she had started performing cable resisted hip abduction as well as walking with weights. She has history of calcaneal bone spurs and has seen podiatry but no history of surgery to L foot/ankle. She saw [name] who encouraged her to restart diclofenac scheduled fpr 2 weeks and then dose as needed. She was also encouraged to use a lace up ASO brace which she has been wearing with improvement. She was noticing  improvement in her symptoms until she stepped in a  hole this past Saturday and had a painful pop. She denies bruising or swelling afterwards. She continued walking on her LLE and over the next 24 hours noted gradual improvement.  09/19/22: Left foot 3 views AP, lateral, lateral oblique: Dorsiflex ptosis present at the midfoot level over likely the second and third tarsometatarsal joints.  Slight pes cavus foot structure.  Osteoarthritic changes present at the second and third tarsometatarsal joints.  Posterior and plantar calcaneal heel spurs present.  Mild metatarsus adductus.   PAIN:    Pain Intensity: Present: 1/10, Best: 0/10, Worst: 10/10 Pain location: L lateral ankle posterior to lateral malleoli and into L foot.  Pain Quality: burning, sharp, and aching ("under ankle bone") Radiating: No  Swelling: No  Popping, catching, locking: Yes  Numbness/Tingling: No Focal Weakness: No but has a history of feeling chronic instability in L ankle; Aggravating factors: walking without brace, walking on uneven surfaces,  Relieving factors: ice, diclofenac, gabapentin, Tylenol; 24-hour pain behavior: worse at the end of the day; History of prior back, hip, knee, or ankle injury, pain, surgery, or therapy: No Dominant hand: right Imaging: Yes, see history; Typical footwear:  Red flags: Negative for personal history of cancer, chills/fever, night sweats, nausea, vomiting, unrelenting pain): Negative  PRECAUTIONS: None  WEIGHT BEARING RESTRICTIONS: No  FALLS: Has patient fallen in last 6 months? 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   Hobbies: Pottery (currently not limited but initially had  trouble sitting at the wheel), gardening/yard work;  Patient Goals: walking across the yard with improved confidence, lifting heavier objects at home without pain in hip, working the yard, "I would like to be able to get back to walking."   OBJECTIVE:   Patient Surveys  FOTO 38, predicted improvement to 74  Cognition Patient is oriented to person, place, and time.  Recent memory is intact.  Remote memory is intact.  Attention span and concentration are intact.  Expressive speech is intact.  Patient's fund of knowledge is within normal limits for educational level.    Gross Musculoskeletal Assessment Bulk: Normal Tone: Normal No trophic changes noted to foot/ankle. No ecchymosis, erythema, or edema noted.  Patient has a high resting arch with minimal arch collapse during gait  GAIT: Comments: No excessive pronation/supination noted during gait.  Minimal pronation noted.  Gait is antalgic with decreased step length on right lower extremity.  Posture: No gross deficits in the posture noted that are contributing to the left ankle pain.  AROM AROM (Normal range in degrees) AROM   Lumbar   Flexion (65) Moderate loss of segmental flexion  Extension (30) Mild loss  Right lateral flexion (25) Mild loss  Left lateral flexion (25) Mild loss  Right rotation (30) Mild loss  Left rotation (30) Mild loss   AROM (Normal range in degrees) AROM   Hip Right Left  Flexion (125)  WNL  Extension (15)    Abduction (40)  WNL  Adduction   WNL  Internal Rotation (45)  Mod/severe loss  External Rotation (45)  Mild loss      Knee    Flexion (135)  WNL (soft end feel)  Extension (0)  0      Ankle  Dorsiflexion (20) 18 18  Plantarflexion (50) 64 54  Inversion (35) 24 22  Eversion (15) 16 14  (* = pain; Blank rows = not tested)  LE MMT: MMT (out of 5) Right  Left   Hip flexion 5 5  Hip extension    Hip abduction 28.0# (measured at last DC) 26.7# (measured at last DC)  Hip  adduction    Hip internal rotation 5 5  Hip external rotation 5 5  Knee flexion 5 5  Knee extension 5 5  Ankle dorsiflexion 5 5*  Ankle plantarflexion 5 5*  Ankle inversion 5 5  Ankle eversion 5 5*  (* = pain; Blank rows = not tested)  Sensation Grossly intact to light touch throughout bilateral LEs as determined by testing dermatomes L2-S2. Proprioception, stereognosis, and hot/cold testing deferred on this date.  Palpation Generally painful to palpation around anterior and left lateral ankle.  Pain with palpation along peroneal muscles/tendons, at the base of the fifth metatarsal, and in the arch of the foot following the tendon of peroneus longus.  Pain to palpation at the sinus tarsus as well as over the deltoid ligament.  Painful palpation along distal end of fibula.  Passive Accessory Motion Superior Tibiofibular Joint: WNL Inferior Tibiofibular Joint: WNL Talocrural Joint Distraction: WNL Talocrural Joint AP: Hypomobile Talocrural Joint PA: WNL  VASCULAR Dorsalis pedis and posterior tibial pulses are palpable  SPECIAL TESTS Ligamentous Integrity Anterior Drawer (ATF, 10-15 plantarflexion with anterior translation): Negative Talar Tilt (CFL, inversion): Negative Eversion Stress Test (Deltoid, eversion): Negative External Rotation Test (High ankle, dorsiflexion and external rotation): Negative Squeeze Test (High ankle): Negative Impingment Sign (Dorsiflexion and eversion): Negative  Achilles Integrity Thompson Test: Negative  Fracture Screening Metatarsal Axial Loading: Negative Tap/Percussion Test: Negative Vibration Test: Not examined  Pronation/Supination Navicular Drop: Negative  Nerve Test Tarsal Tunnel Test (maximal DF, EV, toe ext with tapping over tarsal tunnel): Not examined Test for Morton's Neuroma (compress metatarsals and mobilize): Not examined  Other Windlass Mechanism Test: Not examined   Beighton Scale: Deferred   TODAY'S  TREATMENT:   SUBJECTIVE: Pt reports that she is doing well today. She reports an increase in her ankle pain yesterday to 4/10 which is the first day she has had pain in a couple weeks. Overall she reports at least 80 improvement in symptoms since starting with therapy. She arrives out of her lace-up ASO brace. HEP is going well. No specific questions currently.   PAIN: Denies L lateral ankle pain;   Manual Therapy  Updated outcome measures with patient: FOTO: 55; % improvement: 80% improvement; Worst ankle/foot pain: 4/10 (yesterday, first pain in a couple weeks); Bend/squat/walk: lacking confidence on steps, improved confidence walking across yard;  STM to left lower leg along peroneal muscles and tendons including posterior to the lateral malleolus, near the insertion of the peroneus brevis at the base of the fifth metatarsal, and along the transverse arch of the foot.  L ankle talocrural distraction, grade III, 30s/bout x 3 bouts; L ankle talocrural A/P mobilizations at available end range dorsiflexion, grade III, 30s/bout x 3 bouts; L ankle subtalar distraction, grade III, 30s/bout x 3 bouts;   Ther-ex  Isotonic eversion, inversion, dorsiflexion and plantarflexion 2 x 10 each; Seated towel scrunches x 3 minutes; Heel raises on Airex pad x 10, x 6, second set terminated secondary to pain; Standing gastroc stretch 2 x 30s BLE; Attempted soleus stretch but discontinued secondary to pain;   PATIENT EDUCATION:  Education details: Pt educated  throughout session about proper posture and technique with exercises. Improved exercise technique, movement at target joints, use of target muscles after min to mod verbal, visual, tactile cues; Person educated: Patient Education method: Explanation, Demonstration, and Tactile cues Education comprehension: verbalized understanding and returned demonstration   HOME EXERCISE PROGRAM:  Access Code: [email or web address] URL:  [email or web address] Date: 12/15/2022 Prepared by: Ria Comment  Exercises - Seated Ankle Eversion with Resistance  - 1 x daily - 7 x weekly - 2 sets - 10 reps - 3s hold - Seated Ankle Inversion with Resistance and Legs Crossed (Mirrored)  - 1 x daily - 7 x weekly - 2 sets - 10 reps - 3s hold - Standing Bilateral Heel Raise on Step  - 1 x daily - 7 x weekly - 2 sets - 10 reps - Long Sitting Calf Stretch with Strap  - 1 x daily - 7 x weekly - 3 reps - 30-45s hold - Single Leg Stance  - 1 x daily - 7 x weekly - 3 reps - 30s hold   ASSESSMENT:  CLINICAL IMPRESSION: Updated outcome measures with patient during session today. Overall she reports at least 80% improvement in her ankle symptoms since starting with therapy. She has not had any pain for the last couple weeks until yesterday when her pain flared up. Her worst pain in the last few weeks occurred yesterday when it increased to 4/10. This is a significant improvement compared to the start of therapy when her pain was increasing to 10/10. Her FOTO score improved from 38 to 55. Pt reports that she is still lacking confidence on steps but has improved confidence walking across her yard.  Progressed manual therapy and strengthening with patient during session today. Continued isotonic L ankle exercises. Deferred trigger point dry needling today given low pain. Updated HEP to include isotonic L ankle inversion and eversion with resistance band. Pt encouraged to follow-up as scheduled. She will benefit from PT services to address deficits in range of motion and pain in order to return to full function at home, work, and when working in the yard with less pain.   OBJECTIVE IMPAIRMENTS: Abnormal gait, difficulty walking, decreased ROM, and pain.   ACTIVITY LIMITATIONS: squatting and stairs  PARTICIPATION LIMITATIONS: community activity, occupation, and yard work  PERSONAL FACTORS: Age, Past/current experiences, and 3+ comorbidities:  DMII, lumbar radiculopathy, and bilateral knee OA  are also affecting patient's functional outcome.   REHAB POTENTIAL: Excellent  CLINICAL DECISION MAKING: Stable/uncomplicated  EVALUATION COMPLEXITY: Low   GOALS: Goals reviewed with patient? No  SHORT TERM GOALS: Target date: 11/21/2022  Pt will be independent with HEP to improve strength and decrease ankle pain to improve pain-free function at home and work. Baseline:  Goal status: INITIAL   LONG TERM GOALS: Target date: 12/19/2022  Pt will increase FOTO to at least 60 to demonstrate significant improvement in function at home and work related to ankle pain  Baseline: 10/24/22: 38, 12/15/22: 55 Goal status: PARTIALLY MET  2.  Pt will decrease worst ankle pain by at least 3 points on the NPRS in order to demonstrate clinically significant reduction in ankle pain. Baseline: 10/24/22: worst: 10/10; 12/15/22: 4/10; Goal status: ACHIEVED  3.  Pt will report at least 75% improvement in symptoms in order demonstrate clinically significant reduction in ankle pain/disability.       Baseline: 12/01/22: 80-85% improvement; 12/15/22: At least 80% improvement Goal status: ACHIEVED  4.  Pt will be able to bend, squat, and walk  across an uneven yard without an increase in her L ankle pain in order to resume working in her yard/garden as well as return to walking for exercise with less pain Baseline: 10/24/22: Notable painful in L ankle; 12/15/22: She is still lacking confidence on steps but has improved confidence walking across her yard.  Goal status: PARTIALLY MET   PLAN: PT FREQUENCY: 1-2x/week  PT DURATION: 8 weeks  PLANNED INTERVENTIONS: Therapeutic exercises, Therapeutic activity, Neuromuscular re-education, Balance training, Gait training, Patient/Family education, Self Care, Joint mobilization, Joint manipulation, Vestibular training, Canalith repositioning, Orthotic/Fit training, DME instructions, Dry Needling, Electrical stimulation, Spinal  manipulation, Spinal mobilization, Cryotherapy, Moist heat, Taping, Traction, Ultrasound, Ionotophoresis 4mg /ml Dexamethasone, Manual therapy, and Re-evaluation.  PLAN FOR NEXT SESSION: progress strengthening as tolerated, manual therapy interventions to decrease pain and improve range of motion, review/modify HEP as needed.  Christopher Go SPT Sharalyn Ink  PT, DPT, GCS  ,, PT 12/15/2022, 10:40 AM

## 2022-12-15 ENCOUNTER — Ambulatory Visit: Payer: Medicare Other | Attending: Family Medicine

## 2022-12-15 DIAGNOSIS — G8929 Other chronic pain: Secondary | ICD-10-CM | POA: Diagnosis not present

## 2022-12-15 DIAGNOSIS — M25562 Pain in left knee: Secondary | ICD-10-CM | POA: Diagnosis not present

## 2022-12-15 DIAGNOSIS — M6281 Muscle weakness (generalized): Secondary | ICD-10-CM | POA: Insufficient documentation

## 2022-12-15 DIAGNOSIS — M25572 Pain in left ankle and joints of left foot: Secondary | ICD-10-CM | POA: Insufficient documentation

## 2022-12-18 ENCOUNTER — Ambulatory Visit (INDEPENDENT_AMBULATORY_CARE_PROVIDER_SITE_OTHER): Payer: Medicare Other

## 2022-12-18 DIAGNOSIS — I4819 Other persistent atrial fibrillation: Secondary | ICD-10-CM

## 2022-12-20 ENCOUNTER — Ambulatory Visit (INDEPENDENT_AMBULATORY_CARE_PROVIDER_SITE_OTHER): Payer: Medicare Other | Admitting: Internal Medicine

## 2022-12-20 ENCOUNTER — Encounter: Payer: Self-pay | Admitting: Internal Medicine

## 2022-12-20 VITALS — BP 120/76 | HR 62 | Ht 67.0 in | Wt 233.0 lb

## 2022-12-20 DIAGNOSIS — Z1231 Encounter for screening mammogram for malignant neoplasm of breast: Secondary | ICD-10-CM

## 2022-12-20 DIAGNOSIS — Z13820 Encounter for screening for osteoporosis: Secondary | ICD-10-CM

## 2022-12-20 DIAGNOSIS — E118 Type 2 diabetes mellitus with unspecified complications: Secondary | ICD-10-CM

## 2022-12-20 DIAGNOSIS — Z7984 Long term (current) use of oral hypoglycemic drugs: Secondary | ICD-10-CM | POA: Diagnosis not present

## 2022-12-20 LAB — POCT GLYCOSYLATED HEMOGLOBIN (HGB A1C): Hemoglobin A1C: 5.5 % (ref 4.0–5.6)

## 2022-12-20 MED ORDER — ONETOUCH VERIO VI STRP: ORAL_STRIP | 3 refills | Status: DC

## 2022-12-20 MED ORDER — ONETOUCH DELICA PLUS LANCET33G MISC: 3 refills | Status: DC

## 2022-12-20 NOTE — Assessment & Plan Note (Addendum)
Blood sugars stable without hypoglycemic symptoms or events. Current regimen is metformin. Changes made last visit are adding Farxiga (samples) and reducing dose of metformin.  She did not make the change yet. Lab Results  Component Value Date   HGBA1C 6.0 (A) 09/06/2022  A1C today = 5.5 Continue metformin; try Marcelline Deist if desired

## 2022-12-20 NOTE — Progress Notes (Signed)
Date:  12/20/2022   Name:  Brandy [name]   DOB:  April 23, 1956   MRN:  [id number]   Chief Complaint: Diabetes  Diabetes She presents for her follow-up diabetic visit. She has type 2 diabetes mellitus. Pertinent negatives for hypoglycemia include no headaches or tremors. Pertinent negatives for diabetes include no chest pain, no fatigue, no polydipsia and no polyuria. Current diabetic treatment includes oral agent (monotherapy) (samples of Farxiga given last visit).    Lab Results  Component Value Date   NA 143 03/20/2022   K 4.5 03/20/2022   CO2 21 03/20/2022   GLUCOSE 96 03/20/2022   BUN 14 03/20/2022   CREATININE 0.7 10/24/2022   CALCIUM 9.4 03/20/2022   EGFR 90.001 10/24/2022   GFRNONAA >60 11/16/2020   Lab Results  Component Value Date   CHOL 166 03/20/2022   HDL 46 03/20/2022   LDLCALC 97 03/20/2022   LDLDIRECT 131.0 10/31/2016   TRIG 127 03/20/2022   CHOLHDL 3.6 03/20/2022   Lab Results  Component Value Date   TSH 1.460 08/01/2021   Lab Results  Component Value Date   HGBA1C 5.5 12/20/2022   Lab Results  Component Value Date   WBC 6.5 11/16/2020   HGB 14.6 11/16/2020   HCT 42.7 11/16/2020   MCV 92.4 11/16/2020   PLT 267 11/16/2020   Lab Results  Component Value Date   ALT 14 03/20/2022   AST 14 03/20/2022   ALKPHOS 94 03/20/2022   BILITOT 0.4 03/20/2022   Lab Results  Component Value Date   VD25OH 38.71 10/31/2016     Review of Systems  Constitutional:  Negative for appetite change, fatigue, fever and unexpected weight change.  HENT:  Negative for tinnitus and trouble swallowing.   Eyes:  Negative for visual disturbance.  Respiratory:  Negative for cough, chest tightness and shortness of breath.   Cardiovascular:  Negative for chest pain, palpitations and leg swelling.  Gastrointestinal:  Negative for abdominal pain.  Endocrine: Negative for polydipsia and polyuria.  Genitourinary:  Negative for dysuria and hematuria.  Musculoskeletal:  Negative  for arthralgias.  Neurological:  Negative for tremors, numbness and headaches.  Psychiatric/Behavioral:  Negative for dysphoric mood.     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   Varicose veins of leg with pain, bilateral 07/06/2020   Acquired thrombophilia (HCC) 05/24/2020   Type II diabetes mellitus with complication (HCC) 01/19/2020   Gastric polyp    Colon polyps    Varicose veins of both lower extremities 10/20/2019   Dysphagia 10/20/2019   Osteoarthritis of right hand 10/31/2016   Nonischemic cardiomyopathy (HCC) 04/14/2016   BMI 33.0-33.9,adult 04/14/2016    No Known Allergies  Past Surgical History:  Procedure Laterality Date   ATRIAL FIBRILLATION ABLATION N/A 12/09/2020   Procedure: ATRIAL FIBRILLATION ABLATION;  Surgeon: Lanier Prude, MD;  Location: [facility];  Service: Cardiovascular;  Laterality: N/A;   BIOPSY N/A 01/12/2020   Procedure: BIOPSY;  Surgeon: Pasty Spillers, MD;  Location: [facility];  Service: Endoscopy;  Laterality: N/A;  CARDIAC CATHETERIZATION N/A 04/17/2016   Procedure: Left Heart Cath and Coronary Angiography;  Surgeon: Iran Ouch, MD;  Location: [facility];  Service: Cardiovascular;  Laterality: N/A;   CARDIOVERSION N/A 10/22/2020   Procedure: CARDIOVERSION;  Surgeon: Yvonne Kendall, MD;  Location: [facility] OR[facility];  Service: Cardiovascular;  Laterality: N/A;   CHOLECYSTECTOMY     COLON SURGERY  2021   COLONOSCOPY N/A 01/20/2020   Procedure:  COLONOSCOPY;  Surgeon: Regis Bill, MD;  Location: [facility] END[facility];  Service: Endoscopy;  Laterality: N/A;   COLONOSCOPY WITH PROPOFOL N/A 01/12/2020   Procedure: COLONOSCOPY WITH PROPOFOL;  Surgeon: Pasty Spillers, MD;  Location: [facility];  Service: Endoscopy;  Laterality: N/A;  Diabetic - oral meds sleep apnea   ELECTROPHYSIOLOGIC STUDY N/A 05/26/2016   Procedure: Cardioversion;  Surgeon: Iran Ouch, MD;  Location: [facility] OR[facility];  Service: Cardiovascular;  Laterality: N/A;   ESOPHAGOGASTRODUODENOSCOPY (EGD) WITH PROPOFOL N/A 01/12/2020   Procedure: ESOPHAGOGASTRODUODENOSCOPY (EGD) WITH PROPOFOL;  Surgeon: Pasty Spillers, MD;  Location: [facility];  Service: Endoscopy;  Laterality: N/A;   POLYPECTOMY N/A 01/12/2020   Procedure: POLYPECTOMY;  Surgeon: Pasty Spillers, MD;  Location: [facility];  Service: Endoscopy;  Laterality: N/A;    Social History   Tobacco Use   Smoking status: Never   Smokeless tobacco: Never  Vaping Use   Vaping status: Never Used  Substance Use Topics   Alcohol use: Never   Drug use: Never     Medication list has been reviewed and updated.  Current Meds  Medication Sig   CALCIUM PO Take by mouth daily.   Celery Seed OIL Using as needed to help with constipation   Cholecalciferol (VITAMIN D3) 1000 units CAPS Take 1,000 Units by mouth 2 (two) times daily.   diclofenac (VOLTAREN) 50 MG EC tablet Take 1 tablet by mouth twice daily as needed   furosemide (LASIX) 20 MG tablet Take 1 tablet (20 mg total) by mouth daily as needed for fluid or edema.   gabapentin (NEURONTIN) 100 MG capsule TAKE 1 TO 3 CAPSULES BY MOUTH AT BEDTIME   Lavender Oil OIL Apply 1 application topically daily as needed (stress).   Lemon Oil OIL Take 1 application by mouth daily as needed (add to water).   Lemongrass Oil OIL Apply 1 application topically daily as needed (multiple uses).   Magnesium Bisglycinate (MAG GLYCINATE) 100 MG TABS  Take 270 mg by mouth in the morning, at noon, and at bedtime.   metFORMIN (GLUCOPHAGE-XR) 500 MG 24 hr tablet Take 1/2 (one-half) tablet by mouth twice daily   metoprolol tartrate (LOPRESSOR) 50 MG tablet Take 1/2 (one-half) tablet by mouth twice daily   Multiple Vitamins-Minerals (MULTIVITAMIN WITH MINERALS) tablet Take 1 tablet by mouth daily.   Oil Base OIL Breathe oil as needed for congestion   OIL OF OREGANO PO Uses as needed   peppermint oil liquid Apply 1 application topically daily as needed (itchy/ cold symptoms).   Tea Tree Oil OIL Apply 1 application topically daily as needed (fungus).   vitamin B-12 (CYANOCOBALAMIN) 500 MCG tablet Take 500 mcg by mouth daily.   vitamin C (ASCORBIC ACID) 500 MG tablet Take 500 mg by mouth daily.    vitamin E 45 MG (100 UNITS) capsule Take by mouth daily.   [DISCONTINUED] Lancets (ONETOUCH DELICA PLUS LANCET33G) MISC USE  1 TO CHECK GLUCOSE UP TO 4 TIMES DAILY AS DIRECTED   [DISCONTINUED] ONETOUCH VERIO test strip USE 1  STRIP TO CHECK GLUCOSE UP TO 4 TIMES DAILY AS  DIRECTED       12/20/2022    7:58 AM 09/06/2022   10:35 AM 03/20/2022    9:35 AM 03/10/2022   11:15 AM  GAD 7 : Generalized Anxiety Score  Nervous, Anxious, on Edge 1 0 1 0  Control/stop worrying 1 0 0 0  Worry too much - different things 1 0 0 0  Trouble relaxing 1 2 0 0  Restless 1 1 0 0  Easily annoyed or irritable 1 0 0 0  Afraid - awful might happen 1 0 0 0  Total GAD 7 Score 7 3 1  0  Anxiety Difficulty Somewhat difficult Not difficult at all Not difficult at all Not difficult at all       12/20/2022    7:58 AM 12/06/2022    3:39 PM 09/06/2022   10:35 AM  Depression screen PHQ 2/9  Decreased Interest 1 1 2   Down, Depressed, Hopeless 1 1 0  PHQ - 2 Score 2 2 2   Altered sleeping 2 1 2   Tired, decreased energy 1 1 2   Change in appetite 2  1  Feeling bad or failure about yourself  0  0  Trouble concentrating 1  0  Moving slowly or fidgety/restless 0  1  Suicidal thoughts  0  0  PHQ-9 Score 8 4 8   Difficult doing work/chores Not difficult at all Not difficult at all Not difficult at all    BP Readings from Last 3 Encounters:  12/20/22 120/76  10/13/22 128/70  09/06/22 128/70    Physical Exam Vitals and nursing note reviewed.  Constitutional:      General: She is not in acute distress.    Appearance: She is well-developed.  HENT:     Head: Normocephalic and atraumatic.  Neck:     Vascular: No carotid bruit.  Cardiovascular:     Rate and Rhythm: Normal rate and regular rhythm.  Pulmonary:     Effort: Pulmonary effort is normal. No respiratory distress.     Breath sounds: No wheezing or rhonchi.  Musculoskeletal:     Cervical back: Normal range of motion.     Right lower leg: No edema.     Left lower leg: No edema.  Skin:    General: Skin is warm and dry.     Findings: No rash.  Neurological:     Mental Status: She is alert and oriented to person, place, and time.  Psychiatric:        Mood and Affect: Mood normal.        Behavior: Behavior normal.     Wt Readings from Last 3 Encounters:  12/20/22 233 lb (105.7 kg)  12/06/22 232 lb (105.2 kg)  10/13/22 232 lb (105.2 kg)    BP 120/76   Pulse 62   Ht 5\' 7"  (1.702 m)   Wt 233 lb (105.7 kg)   SpO2 95%   BMI 36.49 kg/m   Assessment and Plan:  Problem List Items Addressed This Visit       Unprioritized   Type II diabetes mellitus with complication (HCC) - Primary (Chronic)    Blood sugars stable without hypoglycemic symptoms or events. Current regimen is metformin. Changes made last visit are adding Farxiga (samples) and reducing dose of metformin.  She did not make the change yet. Lab Results  Component Value Date   HGBA1C 6.0 (A) 09/06/2022  A1C today = 5.5 Continue metformin; try [place]  if desired       Relevant Medications   glucose blood (ONETOUCH VERIO) test strip   Lancets (ONETOUCH DELICA PLUS LANCET33G) MISC   Other Relevant Orders   POCT HgB A1C (Completed)    Other Visit Diagnoses     Encounter for screening mammogram for breast cancer       at [facility]   Encounter for screening for osteoporosis       schedule at [facility] term current use of oral hypoglycemic drug           Return in about 4 months (around 04/21/2023) for CPX.    Reubin Milan, MD [facility] [place]

## 2022-12-22 ENCOUNTER — Ambulatory Visit: Payer: Medicare Other

## 2022-12-22 DIAGNOSIS — G8929 Other chronic pain: Secondary | ICD-10-CM | POA: Diagnosis not present

## 2022-12-22 DIAGNOSIS — M25572 Pain in left ankle and joints of left foot: Secondary | ICD-10-CM

## 2022-12-22 DIAGNOSIS — M6281 Muscle weakness (generalized): Secondary | ICD-10-CM | POA: Diagnosis not present

## 2022-12-22 DIAGNOSIS — M25551 Pain in right hip: Secondary | ICD-10-CM

## 2022-12-22 DIAGNOSIS — M25562 Pain in left knee: Secondary | ICD-10-CM | POA: Diagnosis not present

## 2022-12-22 NOTE — Therapy (Cosign Needed)
OUTPATIENT PHYSICAL THERAPY ANKLE TREATMENT/RECERTIFICATION  Patient Name: Brandy [name] MRN: [id number] DOB:November 27, 1955, 67 y.o., female Today's Date: 12/26/2022  END OF SESSION:  PT End of Session - 12/26/22 0811     Visit Number 11    Number of Visits 33    Date for PT Re-Evaluation 02/16/23    Authorization Type eval: 10/24/22, BC[employer] Medicare 2024  [email or web address] on [place]  $10 copay  no auth req    PT Start Time 0935    PT Stop Time 1020    PT Time Calculation (min) 45 min    Activity Tolerance Patient tolerated treatment well;No increased pain    Behavior During Therapy [facility] for tasks assessed/performed             Past Medical History:  Diagnosis Date   Acute lower GI bleeding 01/19/2020   Arthritis    Atrial fibrillation with rapid ventricular response (HCC)    Chicken pox    Chronic systolic CHF (congestive heart failure) (HCC)    a. 03/2016 Echo: Ef 15-20%, diff HK, ant AK;  b. 05/2016 Echo: Ef 30-35%, diff HK, mildly dil LA/RA.   Diabetes mellitus without complication (HCC)    Dysrhythmia    GERD (gastroesophageal reflux disease)    Heart murmur    Hip discomfort    been going on for 40 years    History of kidney stones    Hyperlipidemia associated with type 2 diabetes mellitus (HCC) 05/12/2021   Hypertension    Moderate mitral regurgitation    a. 03/2016 Echo: mod MR in setting of LV dysfxn.   Motion sickness    car - back seat   NICM (nonischemic cardiomyopathy) (HCC)    a. 03/2016 Echo: EF 15-20%, diff HK, ant AK, mod MR, mod dil LA, mildly dil RA;  b. 04/2016 Cath: nl cors;  c. 05/2016 Echo: EF 30-35%, diff HK.   Obesity    Obstructive sleep apnea    compliant with CPAP   Persistent atrial fibrillation (HCC)    a. Dx 03/2016;  b. CHA2DS2VASc = 2-->Eliquis 5mg  BID;  b. 05/2016 Failed DCCV x 4.   Sleep apnea    Stomach irritation    Visit for monitoring Tikosyn therapy 07/24/2016   Past Surgical History:  Procedure Laterality Date   ATRIAL FIBRILLATION ABLATION N/A  12/09/2020   Procedure: ATRIAL FIBRILLATION ABLATION;  Surgeon: Lanier Prude, MD;  Location: [facility];  Service: Cardiovascular;  Laterality: N/A;   BIOPSY N/A 01/12/2020   Procedure: BIOPSY;  Surgeon: Pasty Spillers, MD;  Location: [facility];  Service: Endoscopy;  Laterality: N/A;   CARDIAC CATHETERIZATION N/A 04/17/2016   Procedure: Left Heart Cath and Coronary Angiography;  Surgeon: Iran Ouch, MD;  Location: [facility] INVASIVE CV [facility];  Service: Cardiovascular;  Laterality: N/A;   CARDIOVERSION N/A 10/22/2020   Procedure: CARDIOVERSION;  Surgeon: Yvonne Kendall, MD;  Location: [facility] ORS;  Service: Cardiovascular;  Laterality: N/A;   CHOLECYSTECTOMY     COLON SURGERY  2021   COLONOSCOPY N/A 01/20/2020   Procedure: COLONOSCOPY;  Surgeon: Regis Bill, MD;  Location: [facility] ENDOS[facility];  Service: Endoscopy;  Laterality: N/A;   COLONOSCOPY WITH PROPOFOL N/A 01/12/2020   Procedure: COLONOSCOPY WITH PROPOFOL;  Surgeon: Pasty Spillers, MD;  Location: [facility];  Service: Endoscopy;  Laterality: N/A;  Diabetic - oral meds sleep apnea   ELECTROPHYSIOLOGIC STUDY N/A 05/26/2016   Procedure: Cardioversion;  Surgeon: Iran Ouch, MD;  Location: [facility] OR[facility];  Service: Cardiovascular;  Laterality: N/A;   ESOPHAGOGASTRODUODENOSCOPY (EGD) WITH PROPOFOL N/A 01/12/2020   Procedure: ESOPHAGOGASTRODUODENOSCOPY (EGD) WITH PROPOFOL;  Surgeon: Pasty Spillers, MD;  Location: [facility];  Service: Endoscopy;  Laterality: N/A;   POLYPECTOMY N/A 01/12/2020   Procedure: POLYPECTOMY;  Surgeon: Pasty Spillers, MD;  Location: [facility];  Service: Endoscopy;  Laterality: N/A;   Patient Active Problem List   Diagnosis Date Noted   Peroneal tendinitis, left 10/13/2022   Left lumbar radiculopathy 07/26/2022   Arthralgia of left knee 07/26/2022   Pes anserinus bursitis of right knee 04/19/2022   Primary osteoarthritis of right knee 03/27/2022    Greater 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 03/27/2022   It band syndrome, right 03/27/2022   Hyperlipidemia associated with type 2 diabetes mellitus (HCC) 05/12/2021   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   Varicose veins of leg with pain, bilateral 07/06/2020   Acquired thrombophilia (HCC) 05/24/2020   Type II diabetes mellitus with complication (HCC) 01/19/2020   Gastric polyp    Colon polyps    Varicose veins of both lower extremities 10/20/2019   Dysphagia 10/20/2019   Osteoarthritis of right hand 10/31/2016   Nonischemic cardiomyopathy (HCC) 04/14/2016   BMI 33.0-33.9,adult 04/14/2016    PCP: Bari Edward MD  REFERRING PROVIDER: Joseph Berkshire MD  REFERRING DIAG: [phone] (ICD-10-CM) - Peroneal tendinitis, left   Rationale for Evaluation and Treatment: Rehabilitation  THERAPY DIAG: Pain in left ankle and joints of left foot  Muscle weakness (generalized)  Chronic pain of left knee  ONSET DATE: 09/26/22 (approximate)  FOLLOW-UP APPT SCHEDULED WITH REFERRING PROVIDER: No, but sees PCP 12/20/22  FROM INITIAL EVALUATION SUBJECTIVE:                                                                                                                                                                                         SUBJECTIVE STATEMENT:  L lateral ankle pain.   PERTINENT HISTORY:  Pt reports onset of L lateral ankle and base of 5th metatarsal pain after exercising in mid May 2024. No trauma reported however pt states that she had started performing cable resisted hip abduction as well as walking with weights. She has history of calcaneal bone spurs and has seen podiatry but no history of surgery to L foot/ankle. She saw [name] who encouraged her to restart diclofenac scheduled fpr 2 weeks and then dose as needed. She was also encouraged to use a lace up ASO brace which she has been wearing with improvement. She was noticing improvement in her  symptoms until she stepped in a hole this past Saturday and had a  painful pop. She denies bruising or swelling afterwards. She continued walking on her LLE and over the next 24 hours noted gradual improvement.  09/19/22: Left foot 3 views AP, lateral, lateral oblique: Dorsiflex ptosis present at the midfoot level over likely the second and third tarsometatarsal joints.  Slight pes cavus foot structure.  Osteoarthritic changes present at the second and third tarsometatarsal joints.  Posterior and plantar calcaneal heel spurs present.  Mild metatarsus adductus.   PAIN:    Pain Intensity: Present: 1/10, Best: 0/10, Worst: 10/10 Pain location: L lateral ankle posterior to lateral malleoli and into L foot.  Pain Quality: burning, sharp, and aching ("under ankle bone") Radiating: No  Swelling: No  Popping, catching, locking: Yes  Numbness/Tingling: No Focal Weakness: No but has a history of feeling chronic instability in L ankle; Aggravating factors: walking without brace, walking on uneven surfaces,  Relieving factors: ice, diclofenac, gabapentin, Tylenol; 24-hour pain behavior: worse at the end of the day; History of prior back, hip, knee, or ankle injury, pain, surgery, or therapy: No Dominant hand: right Imaging: Yes, see history; Typical footwear:  Red flags: Negative for personal history of cancer, chills/fever, night sweats, nausea, vomiting, unrelenting pain): Negative  PRECAUTIONS: None  WEIGHT BEARING RESTRICTIONS: No  FALLS: Has patient fallen in last 6 months? 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   Hobbies: Pottery (currently not limited but initially had trouble sitting at  the wheel), gardening/yard work;  Patient Goals: walking across the yard with improved confidence, lifting heavier objects at home without pain in hip, working the yard, "I would like to be able to get back to walking."   OBJECTIVE:   Patient Surveys  FOTO 38, predicted improvement to 45  Cognition Patient is oriented to person, place, and time.  Recent memory is intact.  Remote memory is intact.  Attention span and concentration are intact.  Expressive speech is intact.  Patient's fund of knowledge is within normal limits for educational level.    Gross Musculoskeletal Assessment Bulk: Normal Tone: Normal No trophic changes noted to foot/ankle. No ecchymosis, erythema, or edema noted.  Patient has a high resting arch with minimal arch collapse during gait  GAIT: Comments: No excessive pronation/supination noted during gait.  Minimal pronation noted.  Gait is antalgic with decreased step length on right lower extremity.  Posture: No gross deficits in the posture noted that are contributing to the left ankle pain.  AROM AROM (Normal range in degrees) AROM   Lumbar   Flexion (65) Moderate loss of segmental flexion  Extension (30) Mild loss  Right lateral flexion (25) Mild loss  Left lateral flexion (25) Mild loss  Right rotation (30) Mild loss  Left rotation (30) Mild loss   AROM (Normal range in degrees) AROM   Hip Right Left  Flexion (125)  WNL  Extension (15)    Abduction (40)  WNL  Adduction   WNL  Internal Rotation (45)  Mod/severe loss  External Rotation (45)  Mild loss      Knee    Flexion (135)  WNL (soft end feel)  Extension (0)  0      Ankle    Dorsiflexion (20) 18 18  Plantarflexion (50) 64 54  Inversion (35) 24 22  Eversion (15) 16 14  (* = pain; Blank rows = not tested)  LE MMT: MMT (out of 5) Right  Left   Hip flexion 5 5  Hip extension    Hip abduction 28.0# (measured at last DC) 26.7# (measured at last DC)  Hip adduction    Hip internal  rotation 5 5  Hip external rotation 5 5  Knee flexion 5 5  Knee extension 5 5  Ankle dorsiflexion 5 5*  Ankle plantarflexion 5 5*  Ankle inversion 5 5  Ankle eversion 5 5*  (* = pain; Blank rows = not tested)  Sensation Grossly intact to light touch throughout bilateral LEs as determined by testing dermatomes L2-S2. Proprioception, stereognosis, and hot/cold testing deferred on this date.  Palpation Generally painful to palpation around anterior and left lateral ankle.  Pain with palpation along peroneal muscles/tendons, at the base of the fifth metatarsal, and in the arch of the foot following the tendon of peroneus longus.  Pain to palpation at the sinus tarsus as well as over the deltoid ligament.  Painful palpation along distal end of fibula.  Passive Accessory Motion Superior Tibiofibular Joint: WNL Inferior Tibiofibular Joint: WNL Talocrural Joint Distraction: WNL Talocrural Joint AP: Hypomobile Talocrural Joint PA: WNL  VASCULAR Dorsalis pedis and posterior tibial pulses are palpable  SPECIAL TESTS Ligamentous Integrity Anterior Drawer (ATF, 10-15 plantarflexion with anterior translation): Negative Talar Tilt (CFL, inversion): Negative Eversion Stress Test (Deltoid, eversion): Negative External Rotation Test (High ankle, dorsiflexion and external rotation): Negative Squeeze Test (High ankle): Negative Impingment Sign (Dorsiflexion and eversion): Negative  Achilles Integrity Thompson Test: Negative  Fracture Screening Metatarsal Axial Loading: Negative Tap/Percussion Test: Negative Vibration Test: Not examined  Pronation/Supination Navicular Drop: Negative  Nerve Test Tarsal Tunnel Test (maximal DF, EV, toe ext with tapping over tarsal tunnel): Not examined Test for Morton's Neuroma (compress metatarsals and mobilize): Not examined  Other Windlass Mechanism Test: Not examined   Beighton Scale: Deferred   TODAY'S TREATMENT:   SUBJECTIVE: Pt reports that  she is doing well today. Slightly tender after last session; mitigated after an hour with rest. HEP is going well. No specific questions currently.   PAIN: 2-3/10 plantar of left foot.    Manual Therapy  STM to plantar aspect of  left foot, transverse arch and medial arch of the foot; improved pain following STM.   Ther-ex  Heel raises on Stair Step 2 x 10; Standing gastroc stretch 2 x 30s BLE;  Neuromuscular Re-ed Airex 3/4 Semi-Tandem stance with balloon toss in // bars 2 x 30 sec; Airex Beam Side Stepping;  Airex Beam Crossover stepping;  Airex Beam Tandem Steps;  Ankle Rocker Board 2 x 10 DF/PF  Not Performed:  Isotonic eversion, inversion, dorsiflexion and plantarflexion 2 x 10 each; L ankle talocrural distraction, grade III, 30s/bout x 3 bouts; L ankle talocrural A/P mobilizations at available end range dorsiflexion, grade III, 30s/bout x 3 bouts; L ankle subtalar distraction, grade III, 30s/bout x 3 bouts; Seated towel scrunches x 3 minutes;  PATIENT EDUCATION:  Education details: Pt educated throughout session about proper posture and technique with exercises. Improved exercise technique, movement at target joints, use of target muscles after min to mod verbal, visual, tactile cues; Person educated: Patient Education method: Explanation, Demonstration, and Tactile cues Education comprehension: verbalized understanding and returned demonstration   HOME EXERCISE PROGRAM:  Access Code: [email or web address] URL: [email or web address] Date: 12/15/2022 Prepared by: Ria Comment  Exercises - Seated Ankle Eversion with Resistance  - 1 x daily - 7 x weekly - 2 sets - 10 reps - 3s hold - Seated Ankle Inversion with Resistance and Legs Crossed (Mirrored)  - 1 x daily - 7 x weekly - 2 sets - 10 reps - 3s hold - Standing Bilateral Heel Raise on Step  - 1 x daily - 7 x weekly - 2 sets - 10 reps - Long Sitting Calf Stretch with Strap  - 1 x daily - 7 x weekly - 3 reps - 30-45s  hold - Single Leg Stance  - 1 x daily - 7 x weekly - 3 reps - 30s hold   ASSESSMENT:  CLINICAL IMPRESSION: Pt arrived to the physical therapy session with minor pain in left foot after working in the yard yesterday. Updated outcome measures with patient during last session. Overall she reports at least 80% improvement in her ankle symptoms since starting with therapy. She has not had any pain for the last couple weeks until her pain flared up last week. Her worst pain in the last few weeks occurred last week when it increased to 4/10. This is a significant improvement compared to the start of therapy when her pain was increasing to 10/10. Her FOTO score improved from 38 to 55. Pt reports that she is still lacking confidence on steps but has improved confidence walking across her yard. Today's session focused on progressing ankle strength and stability. Pt tolerated higher balance exercises on airex balance pad; able to maintain balance in semi tandem stance while performing UE dynamic tasks. Today patient able to perform heel raises without additional report of pain in the ankle. Pt's balance challenged with Airex beam, crossover and lateral stepping. Intermittent rest breaks provided in order to mitigate fatigue. She continues to demonstrate improvements with balance and LE strengthening. No HEP updates after today's session. Pt will continue to benefit from PT services to address deficits in strength, balance, and mobility in order to return to full function at home.   OBJECTIVE IMPAIRMENTS: Abnormal gait, difficulty walking, decreased ROM, and pain.   ACTIVITY LIMITATIONS: squatting and stairs  PARTICIPATION LIMITATIONS: community activity, occupation, and yard work  PERSONAL FACTORS: Age, Past/current experiences, and 3+ comorbidities: DMII, lumbar radiculopathy, and bilateral knee OA  are also affecting patient's functional outcome.   REHAB POTENTIAL: Excellent  CLINICAL DECISION MAKING:  Stable/uncomplicated  EVALUATION COMPLEXITY: Low   GOALS: Goals reviewed with patient? No  SHORT TERM GOALS:  Pt will be independent with HEP to improve strength and decrease ankle pain to improve pain-free function at home and work. Baseline:  Goal status: ACHIEVED   LONG TERM GOALS: Target date: 02/16/2023  Pt will increase FOTO to at least 60 to demonstrate significant improvement in function at home and work related to ankle pain  Baseline: 10/24/22: 38, 12/15/22: 55 Goal status: PARTIALLY MET  2.  Pt will decrease worst ankle pain by at least 3 points on the NPRS in order to demonstrate clinically significant reduction in ankle pain. Baseline: 10/24/22: worst: 10/10; 12/15/22: 4/10; Goal status: ACHIEVED  3.  Pt will report at least 75% improvement in symptoms in order demonstrate clinically significant reduction in ankle pain/disability.       Baseline: 12/01/22: 80-85% improvement; 12/15/22: At least 80% improvement Goal status: ACHIEVED  4.  Pt will be able to bend, squat, and walk across an uneven yard without an increase in her L ankle pain in order to resume working in  her yard/garden as well as return to walking for exercise with less pain Baseline: 10/24/22: Notable painful in L ankle; 12/15/22: She is still lacking confidence on steps but has improved confidence walking across her yard.  al status: PARTIALLY MET   PLAN: PT FREQUENCY: 1-2x/week  PT DURATION: 8 weeks  PLANNED INTERVENTIONS: Therapeutic exercises, Therapeutic activity, Neuromuscular re-education, Balance training, Gait training, Patient/Family education, Self Care, Joint mobilization, Joint manipulation, Vestibular training, Canalith repositioning, Orthotic/Fit training, DME instructions, Dry Needling, Electrical stimulation, Spinal manipulation, Spinal mobilization, Cryotherapy, Moist heat, Taping, Traction, Ultrasound, Ionotophoresis 4mg /ml Dexamethasone, Manual therapy, and Re-evaluation.  PLAN FOR NEXT  SESSION: progress strengthening as tolerated, manual therapy interventions to decrease pain and improve range of motion, review/modify HEP as needed.    SPT Sharalyn Ink Huprich PT, DPT, GCS  Huprich,Jason, PT 12/26/2022, 8:14 AM

## 2022-12-26 ENCOUNTER — Ambulatory Visit: Payer: Medicare Other

## 2022-12-26 DIAGNOSIS — M6281 Muscle weakness (generalized): Secondary | ICD-10-CM | POA: Diagnosis not present

## 2022-12-26 DIAGNOSIS — M25562 Pain in left knee: Secondary | ICD-10-CM | POA: Diagnosis not present

## 2022-12-26 DIAGNOSIS — M25572 Pain in left ankle and joints of left foot: Secondary | ICD-10-CM | POA: Diagnosis not present

## 2022-12-26 DIAGNOSIS — G8929 Other chronic pain: Secondary | ICD-10-CM | POA: Diagnosis not present

## 2022-12-26 NOTE — Addendum Note (Signed)
Addended by: Ria Comment D on: 12/26/2022 08:17 AM   Modules accepted: Orders

## 2022-12-26 NOTE — Therapy (Addendum)
OUTPATIENT PHYSICAL THERAPY ANKLE TREATMENT  Patient Name: Brandy [name] MRN: [id number] DOB:June 30, 1955, 67 y.o., female Today's Date: 12/26/2022  END OF SESSION:  PT End of Session - 12/26/22 0817     Visit Number 12    Number of Visits 33    Date for PT Re-Evaluation 02/16/23    Authorization Type eval: 10/24/22, BCBS Medicare 2024  [id number] [place]  $10 copay  no auth req    PT Start Time 0810    PT Stop Time 0850    PT Time Calculation (min) 40 min    Activity Tolerance Patient tolerated treatment well;No increased pain    Behavior During Therapy [facility] for tasks assessed/performed             Past Medical History:  Diagnosis Date   Acute lower GI bleeding 01/19/2020   Arthritis    Atrial fibrillation with rapid ventricular response (HCC)    Chicken pox    Chronic systolic CHF (congestive heart failure) (HCC)    a. 03/2016 Echo: Ef 15-20%, diff HK, ant AK;  b. 05/2016 Echo: Ef 30-35%, diff HK, mildly dil LA/RA.   Diabetes mellitus without complication (HCC)    Dysrhythmia    GERD (gastroesophageal reflux disease)    Heart murmur    Hip discomfort    been going on for 40 years    History of kidney stones    Hyperlipidemia associated with type 2 diabetes mellitus (HCC) 05/12/2021   Hypertension    Moderate mitral regurgitation    a. 03/2016 Echo: mod MR in setting of LV dysfxn.   Motion sickness    car - back seat   NICM (nonischemic cardiomyopathy) (HCC)    a. 03/2016 Echo: EF 15-20%, diff HK, ant AK, mod MR, mod dil LA, mildly dil RA;  b. 04/2016 Cath: nl cors;  c. 05/2016 Echo: EF 30-35%, diff HK.   Obesity    Obstructive sleep apnea    compliant with CPAP   Persistent atrial fibrillation (HCC)    a. Dx 03/2016;  b. CHA2DS2VASc = 2-->Eliquis 5mg  BID;  b. 05/2016 Failed DCCV x 4.   Sleep apnea    Stomach irritation    Visit for monitoring Tikosyn therapy 07/24/2016   Past Surgical History:  Procedure Laterality Date   ATRIAL FIBRILLATION ABLATION N/A 12/09/2020    Procedure: ATRIAL FIBRILLATION ABLATION;  Surgeon: Lanier Prude, MD;  Location: [facility] INVASIVE CV LAB;  Service: Cardiovascular;  Laterality: N/A;   BIOPSY N/A 01/12/2020   Procedure: BIOPSY;  Surgeon: Pasty Spillers, MD;  Location: [facility] SURGERY [facility];  Service: Endoscopy;  Laterality: N/A;   CARDIAC CATHETERIZATION N/A 04/17/2016   Procedure: Left Heart Cath and Coronary Angiography;  Surgeon: Iran Ouch, MD;  Location: [facility] INVASIVE CV LAB;  Service: Cardiovascular;  Laterality: N/A;   CARDIOVERSION N/A 10/22/2020   Procedure: CARDIOVERSION;  Surgeon: Yvonne Kendall, MD;  Location: [facility] ORS;  Service: Cardiovascular;  Laterality: N/A;   CHOLECYSTECTOMY     COLON SURGERY  2021   COLONOSCOPY N/A 01/20/2020   Procedure: COLONOSCOPY;  Surgeon: Regis Bill, MD;  Location: [facility] ENDOSCOPY;  Service: Endoscopy;  Laterality: N/A;   COLONOSCOPY WITH PROPOFOL N/A 01/12/2020   Procedure: COLONOSCOPY WITH PROPOFOL;  Surgeon: Pasty Spillers, MD;  Location: [facility];  Service: Endoscopy;  Laterality: N/A;  Diabetic - oral meds sleep apnea   ELECTROPHYSIOLOGIC STUDY N/A 05/26/2016   Procedure: Cardioversion;  Surgeon: Iran Ouch, MD;  Location: [facility] ORS;  Service: Cardiovascular;  Laterality: N/A;   ESOPHAGOGASTRODUODENOSCOPY (EGD) WITH PROPOFOL N/A 01/12/2020   Procedure: ESOPHAGOGASTRODUODENOSCOPY (EGD) WITH PROPOFOL;  Surgeon: Pasty Spillers, MD;  Location: [facility];  Service: Endoscopy;  Laterality: N/A;   POLYPECTOMY N/A 01/12/2020   Procedure: POLYPECTOMY;  Surgeon: Pasty Spillers, MD;  Location: [facility] SURGERY [facility];  Service: Endoscopy;  Laterality: N/A;   Patient Active Problem List   Diagnosis Date Noted   Peroneal tendinitis, left 10/13/2022   Left lumbar radiculopathy 07/26/2022   Arthralgia of left knee 07/26/2022   Pes anserinus bursitis of right knee 04/19/2022   Primary osteoarthritis of right knee 03/27/2022   Greater  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 03/27/2022   It band syndrome, right 03/27/2022   Hyperlipidemia associated with type 2 diabetes mellitus (HCC) 05/12/2021   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   Varicose veins of leg with pain, bilateral 07/06/2020   Acquired thrombophilia (HCC) 05/24/2020   Type II diabetes mellitus with complication (HCC) 01/19/2020   Gastric polyp    Colon polyps    Varicose veins of both lower extremities 10/20/2019   Dysphagia 10/20/2019   Osteoarthritis of right hand 10/31/2016   Nonischemic cardiomyopathy (HCC) 04/14/2016   BMI 33.0-33.9,adult 04/14/2016    PCP: Bari Edward MD  REFERRING PROVIDER: Joseph Berkshire MD  REFERRING DIAG: [phone] (ICD-10-CM) - Peroneal tendinitis, left   Rationale for Evaluation and Treatment: Rehabilitation  THERAPY DIAG: Pain in left ankle and joints of left foot  Muscle weakness (generalized)  ONSET DATE: 09/26/22 (approximate)  FOLLOW-UP APPT SCHEDULED WITH REFERRING PROVIDER: No, but sees PCP 12/20/22  FROM INITIAL EVALUATION SUBJECTIVE:                                                                                                                                                                                         SUBJECTIVE STATEMENT:  L lateral ankle pain.   PERTINENT HISTORY:  Pt reports onset of L lateral ankle and base of 5th metatarsal pain after exercising in mid May 2024. No trauma reported however pt states that she had started performing cable resisted hip abduction as well as walking with weights. She has history of calcaneal bone spurs and has seen podiatry but no history of surgery to L foot/ankle. She saw [name] who encouraged her to restart diclofenac scheduled fpr 2 weeks and then dose as needed. She was also encouraged to use a lace up ASO brace which she has been wearing with improvement. She was noticing improvement in her symptoms until she stepped in a hole  this past Saturday and had a painful pop. She denies bruising or  swelling afterwards. She continued walking on her LLE and over the next 24 hours noted gradual improvement.  09/19/22: Left foot 3 views AP, lateral, lateral oblique: Dorsiflex ptosis present at the midfoot level over likely the second and third tarsometatarsal joints.  Slight pes cavus foot structure.  Osteoarthritic changes present at the second and third tarsometatarsal joints.  Posterior and plantar calcaneal heel spurs present.  Mild metatarsus adductus.   PAIN:    Pain Intensity: Present: 1/10, Best: 0/10, Worst: 10/10 Pain location: L lateral ankle posterior to lateral malleoli and into L foot.  Pain Quality: burning, sharp, and aching ("under ankle bone") Radiating: No  Swelling: No  Popping, catching, locking: Yes  Numbness/Tingling: No Focal Weakness: No but has a history of feeling chronic instability in L ankle; Aggravating factors: walking without brace, walking on uneven surfaces,  Relieving factors: ice, diclofenac, gabapentin, Tylenol; 24-hour pain behavior: worse at the end of the day; History of prior back, hip, knee, or ankle injury, pain, surgery, or therapy: No Dominant hand: right Imaging: Yes, see history; Typical footwear:  Red flags: Negative for personal history of cancer, chills/fever, night sweats, nausea, vomiting, unrelenting pain): Negative  PRECAUTIONS: None  WEIGHT BEARING RESTRICTIONS: No  FALLS: Has patient fallen in last 6 months? 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   Hobbies: Pottery (currently not limited but initially had trouble sitting at the wheel), gardening/yard  work;  Patient Goals: walking across the yard with improved confidence, lifting heavier objects at home without pain in hip, working the yard, "I would like to be able to get back to walking."   OBJECTIVE:   Patient Surveys  FOTO 38, predicted improvement to 40  Cognition Patient is oriented to person, place, and time.  Recent memory is intact.  Remote memory is intact.  Attention span and concentration are intact.  Expressive speech is intact.  Patient's fund of knowledge is within normal limits for educational level.    Gross Musculoskeletal Assessment Bulk: Normal Tone: Normal No trophic changes noted to foot/ankle. No ecchymosis, erythema, or edema noted.  Patient has a high resting arch with minimal arch collapse during gait  GAIT: Comments: No excessive pronation/supination noted during gait.  Minimal pronation noted.  Gait is antalgic with decreased step length on right lower extremity.  Posture: No gross deficits in the posture noted that are contributing to the left ankle pain.  AROM AROM (Normal range in degrees) AROM   Lumbar   Flexion (65) Moderate loss of segmental flexion  Extension (30) Mild loss  Right lateral flexion (25) Mild loss  Left lateral flexion (25) Mild loss  Right rotation (30) Mild loss  Left rotation (30) Mild loss   AROM (Normal range in degrees) AROM   Hip Right Left  Flexion (125)  WNL  Extension (15)    Abduction (40)  WNL  Adduction   WNL  Internal Rotation (45)  Mod/severe loss  External Rotation (45)  Mild loss      Knee    Flexion (135)  WNL (soft end feel)  Extension (0)  0      Ankle    Dorsiflexion (20) 18 18  Plantarflexion (50) 64 54  Inversion (35) 24 22  Eversion (15) 16 14  (* = pain; Blank rows = not tested)  LE MMT: MMT (out of 5) Right  Left   Hip flexion 5 5  Hip extension    Hip abduction 28.0# (measured at last DC) 26.7# (measured at last DC)  Hip adduction    Hip internal rotation 5 5  Hip  external rotation 5 5  Knee flexion 5 5  Knee extension 5 5  Ankle dorsiflexion 5 5*  Ankle plantarflexion 5 5*  Ankle inversion 5 5  Ankle eversion 5 5*  (* = pain; Blank rows = not tested)  Sensation Grossly intact to light touch throughout bilateral LEs as determined by testing dermatomes L2-S2. Proprioception, stereognosis, and hot/cold testing deferred on this date.  Palpation Generally painful to palpation around anterior and left lateral ankle.  Pain with palpation along peroneal muscles/tendons, at the base of the fifth metatarsal, and in the arch of the foot following the tendon of peroneus longus.  Pain to palpation at the sinus tarsus as well as over the deltoid ligament.  Painful palpation along distal end of fibula.  Passive Accessory Motion Superior Tibiofibular Joint: WNL Inferior Tibiofibular Joint: WNL Talocrural Joint Distraction: WNL Talocrural Joint AP: Hypomobile Talocrural Joint PA: WNL  VASCULAR Dorsalis pedis and posterior tibial pulses are palpable  SPECIAL TESTS Ligamentous Integrity Anterior Drawer (ATF, 10-15 plantarflexion with anterior translation): Negative Talar Tilt (CFL, inversion): Negative Eversion Stress Test (Deltoid, eversion): Negative External Rotation Test (High ankle, dorsiflexion and external rotation): Negative Squeeze Test (High ankle): Negative Impingment Sign (Dorsiflexion and eversion): Negative  Achilles Integrity Thompson Test: Negative  Fracture Screening Metatarsal Axial Loading: Negative Tap/Percussion Test: Negative Vibration Test: Not examined  Pronation/Supination Navicular Drop: Negative  Nerve Test Tarsal Tunnel Test (maximal DF, EV, toe ext with tapping over tarsal tunnel): Not examined Test for Morton's Neuroma (compress metatarsals and mobilize): Not examined  Other Windlass Mechanism Test: Not examined   Beighton Scale: Deferred   TODAY'S TREATMENT:   SUBJECTIVE: Pt reports that she is doing well  today. No additional reports of pain in ankle. HEP is going well. No specific questions currently.   PAIN: Denies   Manual Therapy  STM to plantar aspect of  left foot, transverse arch and medial arch of the foot; improved pain following STM.   Ther-ex  Nustep Level 1-5 x 10 min for warm up and interval history and change of resistance.  Heel raises on Stair Step 2 x 10; Standing gastroc stretch 2 x 30s BLE; Seated Inversion 2 x 10 BLE;   Neuromuscular Re-ed Airex 3/4 Semi-Tandem stance both LE in front with ball toss in // bars 2 x 30 sec; Airex Static Tandem Stance without UE support 1 x 30s;  Single LE on Balance Pad with Lunges BLE 4 x 6;  Airex Single Leg Taps with 3 Cones x different positions 4 x 30s;   Not Performed:  Ankle Rocker Board 2 x 10 DF/PF Airex Beam Side Stepping;  Airex Beam Crossover stepping;  Airex Beam Tandem Steps;  Isotonic eversion, inversion, dorsiflexion and plantarflexion 2 x 10 each; L ankle talocrural distraction, grade III, 30s/bout x 3 bouts; L ankle talocrural A/P mobilizations at available end range dorsiflexion, grade III, 30s/bout x 3 bouts; L ankle subtalar distraction, grade III, 30s/bout x 3 bouts; Seated towel scrunches x 3 minutes;  PATIENT EDUCATION:  Education details: Pt educated throughout session about proper posture and technique with exercises. Improved exercise technique,  movement at target joints, use of target muscles after min to mod verbal, visual, tactile cues; Person educated: Patient Education method: Explanation, Demonstration, and Tactile cues Education comprehension: verbalized understanding and returned demonstration   HOME EXERCISE PROGRAM:  Access Code: [email or web address] URL: [email or web address] Date: 12/15/2022 Prepared by: Ria Comment  Exercises - Seated Ankle Eversion with Resistance  - 1 x daily - 7 x weekly - 2 sets - 10 reps - 3s hold - Seated Ankle Inversion with Resistance and Legs Crossed  (Mirrored)  - 1 x daily - 7 x weekly - 2 sets - 10 reps - 3s hold - Standing Bilateral Heel Raise on Step  - 1 x daily - 7 x weekly - 2 sets - 10 reps - Long Sitting Calf Stretch with Strap  - 1 x daily - 7 x weekly - 3 reps - 30-45s hold - Single Leg Stance  - 1 x daily - 7 x weekly - 3 reps - 30s hold   ASSESSMENT:  CLINICAL IMPRESSION: Patient arrived to physical therapy highly motivated. Progressed static and dynamic balance in today's session. Able to perform tandem stance on level ground without UE support. Demonstrated progress with static balance on airex pad holding tandem stance with minimal UE support. She continues to demonstrate significant improvements in physical therapy. No Updates with HEP. Pt will continue benefit from PT services to address deficits in strength, balance, and mobility in order to return to full function at home.  OBJECTIVE IMPAIRMENTS: Abnormal gait, difficulty walking, decreased ROM, and pain.   ACTIVITY LIMITATIONS: squatting and stairs  PARTICIPATION LIMITATIONS: community activity, occupation, and yard work  PERSONAL FACTORS: Age, Past/current experiences, and 3+ comorbidities: DMII, lumbar radiculopathy, and bilateral knee OA  are also affecting patient's functional outcome.   REHAB POTENTIAL: Excellent  CLINICAL DECISION MAKING: Stable/uncomplicated  EVALUATION COMPLEXITY: Low   GOALS: Goals reviewed with patient? No  SHORT TERM GOALS:  Pt will be independent with HEP to improve strength and decrease ankle pain to improve pain-free function at home and work. Baseline:  Goal status: ACHIEVED   LONG TERM GOALS: Target date: 02/16/2023  Pt will increase FOTO to at least 60 to demonstrate significant improvement in function at home and work related to ankle pain  Baseline: 10/24/22: 38, 12/15/22: 55 Goal status: PARTIALLY MET  2.  Pt will decrease worst ankle pain by at least 3 points on the NPRS in order to demonstrate clinically significant  reduction in ankle pain. Baseline: 10/24/22: worst: 10/10; 12/15/22: 4/10; Goal status: ACHIEVED  3.  Pt will report at least 75% improvement in symptoms in order demonstrate clinically significant reduction in ankle pain/disability.       Baseline: 12/01/22: 80-85% improvement; 12/15/22: At least 80% improvement Goal status: ACHIEVED  4.  Pt will be able to bend, squat, and walk across an uneven yard without an increase in her L ankle pain in order to resume working in her yard/garden as well as return to walking for exercise with less pain Baseline: 10/24/22: Notable painful in L ankle; 12/15/22: She is still lacking confidence on steps but has improved confidence walking across her yard.  Goal status: PARTIALLY MET   PLAN: PT FREQUENCY: 1-2x/week  PT DURATION: 8 weeks  PLANNED INTERVENTIONS: Therapeutic exercises, Therapeutic activity, Neuromuscular re-education, Balance training, Gait training, Patient/Family education, Self Care, Joint mobilization, Joint manipulation, Vestibular training, Canalith repositioning, Orthotic/Fit training, DME instructions, Dry Needling, Electrical stimulation, Spinal manipulation, Spinal mobilization, Cryotherapy, Moist heat, Taping, Traction, Ultrasound, Ionotophoresis 4mg /ml Dexamethasone,  Manual therapy, and Re-evaluation.  PLAN FOR NEXT SESSION: progress strengthening as tolerated, manual therapy interventions to decrease pain and improve range of motion, review/modify HEP as needed.    SPT Barbara Cower D Huprich PT, DPT, GCS

## 2023-01-02 ENCOUNTER — Ambulatory Visit: Payer: Medicare Other

## 2023-01-02 DIAGNOSIS — M25572 Pain in left ankle and joints of left foot: Secondary | ICD-10-CM | POA: Diagnosis not present

## 2023-01-02 DIAGNOSIS — M25562 Pain in left knee: Secondary | ICD-10-CM | POA: Diagnosis not present

## 2023-01-02 DIAGNOSIS — G8929 Other chronic pain: Secondary | ICD-10-CM | POA: Diagnosis not present

## 2023-01-02 DIAGNOSIS — M6281 Muscle weakness (generalized): Secondary | ICD-10-CM

## 2023-01-02 NOTE — Progress Notes (Signed)
Care[facility] Summary Report / Loop Recorder 

## 2023-01-02 NOTE — Therapy (Addendum)
OUTPATIENT PHYSICAL THERAPY ANKLE TREATMENT  Patient Name: Brandy [name] MRN: [id number] DOB:09-06-1955, 67 y.o., female Today's Date: 01/02/2023  END OF SESSION:  PT End of Session - 01/02/23 0803     Visit Number 13    Number of Visits 33    Date for PT Re-Evaluation 02/16/23    Authorization Type eval: 10/24/22, BCBS Medicare 2024  GY:IRSWN on [place]  $10 copay  no auth req    PT Start Time 0800    PT Stop Time 0845    PT Time Calculation (min) 45 min    Activity Tolerance Patient tolerated treatment well    Behavior During Therapy [facility] for tasks assessed/performed             Past Medical History:  Diagnosis Date   Acute lower GI bleeding 01/19/2020   Arthritis    Atrial fibrillation with rapid ventricular response (HCC)    Chicken pox    Chronic systolic CHF (congestive heart failure) (HCC)    a. 03/2016 Echo: Ef 15-20%, diff HK, ant AK;  b. 05/2016 Echo: Ef 30-35%, diff HK, mildly dil LA/RA.   Diabetes mellitus without complication (HCC)    Dysrhythmia    GERD (gastroesophageal reflux disease)    Heart murmur    Hip discomfort    been going on for 40 years    History of kidney stones    Hyperlipidemia associated with type 2 diabetes mellitus (HCC) 05/12/2021   Hypertension    Moderate mitral regurgitation    a. 03/2016 Echo: mod MR in setting of LV dysfxn.   Motion sickness    car - back seat   NICM (nonischemic cardiomyopathy) (HCC)    a. 03/2016 Echo: EF 15-20%, diff HK, ant AK, mod MR, mod dil LA, mildly dil RA;  b. 04/2016 Cath: nl cors;  c. 05/2016 Echo: EF 30-35%, diff HK.   Obesity    Obstructive sleep apnea    compliant with CPAP   Persistent atrial fibrillation (HCC)    a. Dx 03/2016;  b. CHA2DS2VASc = 2-->Eliquis 5mg  BID;  b. 05/2016 Failed DCCV x 4.   Sleep apnea    Stomach irritation    Visit for monitoring Tikosyn therapy 07/24/2016   Past Surgical History:  Procedure Laterality Date   ATRIAL FIBRILLATION ABLATION N/A 12/09/2020   Procedure: ATRIAL  FIBRILLATION ABLATION;  Surgeon: Lanier Prude, MD;  Location: [facility];  Service: Cardiovascular;  Laterality: N/A;   BIOPSY N/A 01/12/2020   Procedure: BIOPSY;  Surgeon: Pasty Spillers, MD;  Location: [facility];  Service: Endoscopy;  Laterality: N/A;   CARDIAC CATHETERIZATION N/A 04/17/2016   Procedure: Left Heart Cath and Coronary Angiography;  Surgeon: Iran Ouch, MD;  Location: [facility];  Service: Cardiovascular;  Laterality: N/A;   CARDIOVERSION N/A 10/22/2020   Procedure: CARDIOVERSION;  Surgeon: Yvonne Kendall, MD;  Location: [facility];  Service: Cardiovascular;  Laterality: N/A;   CHOLECYSTECTOMY     COLON SURGERY  2021   COLONOSCOPY N/A 01/20/2020   Procedure: COLONOSCOPY;  Surgeon: Regis Bill, MD;  Location: [facility];  Service: Endoscopy;  Laterality: N/A;   COLONOSCOPY WITH PROPOFOL N/A 01/12/2020   Procedure: COLONOSCOPY WITH PROPOFOL;  Surgeon: Pasty Spillers, MD;  Location: [facility];  Service: Endoscopy;  Laterality: N/A;  Diabetic - oral meds sleep apnea   ELECTROPHYSIOLOGIC STUDY N/A 05/26/2016   Procedure: Cardioversion;  Surgeon: Iran Ouch, MD;  Location: [facility];  Service:  Cardiovascular;  Laterality: N/A;   ESOPHAGOGASTRODUODENOSCOPY (EGD) WITH PROPOFOL N/A 01/12/2020   Procedure: ESOPHAGOGASTRODUODENOSCOPY (EGD) WITH PROPOFOL;  Surgeon: Pasty Spillers, MD;  Location: [facility];  Service: Endoscopy;  Laterality: N/A;   POLYPECTOMY N/A 01/12/2020   Procedure: POLYPECTOMY;  Surgeon: Pasty Spillers, MD;  Location: [facility];  Service: Endoscopy;  Laterality: N/A;   Patient Active Problem List   Diagnosis Date Noted   Peroneal tendinitis, left 10/13/2022   Left lumbar radiculopathy 07/26/2022   Arthralgia of left knee 07/26/2022   Pes anserinus bursitis of right knee 04/19/2022   Primary osteoarthritis of right knee 03/27/2022   Greater trochanteric pain  syndrome of right lower extremity 03/27/2022   It band syndrome, right 03/27/2022   Hyperlipidemia associated with type 2 diabetes mellitus (HCC) 05/12/2021   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   Varicose veins of leg with pain, bilateral 07/06/2020   Acquired thrombophilia (HCC) 05/24/2020   Type II diabetes mellitus with complication (HCC) 01/19/2020   Gastric polyp    Colon polyps    Varicose veins of both lower extremities 10/20/2019   Dysphagia 10/20/2019   Osteoarthritis of right hand 10/31/2016   Nonischemic cardiomyopathy (HCC) 04/14/2016   BMI 33.0-33.9,adult 04/14/2016    PCP: Bari Edward MD  REFERRING PROVIDER: Joseph Berkshire MD  REFERRING DIAG: [phone] (ICD-10-CM) - Peroneal tendinitis, left   Rationale for Evaluation and Treatment: Rehabilitation  THERAPY DIAG: Pain in left ankle and joints of left foot  Muscle weakness (generalized)  ONSET DATE: 09/26/22 (approximate)  FOLLOW-UP APPT SCHEDULED WITH REFERRING PROVIDER: No, but sees PCP 12/20/22  FROM INITIAL EVALUATION SUBJECTIVE:                                                                                                                                                                                         SUBJECTIVE STATEMENT:  L lateral ankle pain.   PERTINENT HISTORY:  Pt reports onset of L lateral ankle and base of 5th metatarsal pain after exercising in mid May 2024. No trauma reported however pt states that she had started performing cable resisted hip abduction as well as walking with weights. She has history of calcaneal bone spurs and has seen podiatry but no history of surgery to L foot/ankle. She saw [name] who encouraged her to restart diclofenac scheduled fpr 2 weeks and then dose as needed. She was also encouraged to use a lace up ASO brace which she has been wearing with improvement. She was noticing improvement in her symptoms until she stepped in a hole this past Saturday  and had a painful pop. She denies bruising or swelling  afterwards. She continued walking on her LLE and over the next 24 hours noted gradual improvement.  09/19/22: Left foot 3 views AP, lateral, lateral oblique: Dorsiflex ptosis present at the midfoot level over likely the second and third tarsometatarsal joints.  Slight pes cavus foot structure.  Osteoarthritic changes present at the second and third tarsometatarsal joints.  Posterior and plantar calcaneal heel spurs present.  Mild metatarsus adductus.   PAIN:    Pain Intensity: Present: 1/10, Best: 0/10, Worst: 10/10 Pain location: L lateral ankle posterior to lateral malleoli and into L foot.  Pain Quality: burning, sharp, and aching ("under ankle bone") Radiating: No  Swelling: No  Popping, catching, locking: Yes  Numbness/Tingling: No Focal Weakness: No but has a history of feeling chronic instability in L ankle; Aggravating factors: walking without brace, walking on uneven surfaces,  Relieving factors: ice, diclofenac, gabapentin, Tylenol; 24-hour pain behavior: worse at the end of the day; History of prior back, hip, knee, or ankle injury, pain, surgery, or therapy: No Dominant hand: right Imaging: Yes, see history; Typical footwear:  Red flags: Negative for personal history of cancer, chills/fever, night sweats, nausea, vomiting, unrelenting pain): Negative  PRECAUTIONS: None  WEIGHT BEARING RESTRICTIONS: No  FALLS: Has patient fallen in last 6 months? 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   Hobbies: Pottery (currently not limited but initially had trouble sitting at the wheel), gardening/yard work;  Patient Goals:  walking across the yard with improved confidence, lifting heavier objects at home without pain in hip, working the yard, "I would like to be able to get back to walking."   OBJECTIVE:   Patient Surveys  FOTO 38, predicted improvement to 81  Cognition Patient is oriented to person, place, and time.  Recent memory is intact.  Remote memory is intact.  Attention span and concentration are intact.  Expressive speech is intact.  Patient's fund of knowledge is within normal limits for educational level.    Gross Musculoskeletal Assessment Bulk: Normal Tone: Normal No trophic changes noted to foot/ankle. No ecchymosis, erythema, or edema noted.  Patient has a high resting arch with minimal arch collapse during gait  GAIT: Comments: No excessive pronation/supination noted during gait.  Minimal pronation noted.  Gait is antalgic with decreased step length on right lower extremity.  Posture: No gross deficits in the posture noted that are contributing to the left ankle pain.  AROM AROM (Normal range in degrees) AROM   Lumbar   Flexion (65) Moderate loss of segmental flexion  Extension (30) Mild loss  Right lateral flexion (25) Mild loss  Left lateral flexion (25) Mild loss  Right rotation (30) Mild loss  Left rotation (30) Mild loss   AROM (Normal range in degrees) AROM   Hip Right Left  Flexion (125)  WNL  Extension (15)    Abduction (40)  WNL  Adduction   WNL  Internal Rotation (45)  Mod/severe loss  External Rotation (45)  Mild loss      Knee    Flexion (135)  WNL (soft end feel)  Extension (0)  0      Ankle    Dorsiflexion (20) 18 18  Plantarflexion (50) 64 54  Inversion (  35) 24 22  Eversion (15) 16 14  (* = pain; Blank rows = not tested)  LE MMT: MMT (out of 5) Right  Left   Hip flexion 5 5  Hip extension    Hip abduction 28.0# (measured at last DC) 26.7# (measured at last DC)  Hip adduction    Hip internal rotation 5 5  Hip external rotation 5 5  Knee  flexion 5 5  Knee extension 5 5  Ankle dorsiflexion 5 5*  Ankle plantarflexion 5 5*  Ankle inversion 5 5  Ankle eversion 5 5*  (* = pain; Blank rows = not tested)  Sensation Grossly intact to light touch throughout bilateral LEs as determined by testing dermatomes L2-S2. Proprioception, stereognosis, and hot/cold testing deferred on this date.  Palpation Generally painful to palpation around anterior and left lateral ankle.  Pain with palpation along peroneal muscles/tendons, at the base of the fifth metatarsal, and in the arch of the foot following the tendon of peroneus longus.  Pain to palpation at the sinus tarsus as well as over the deltoid ligament.  Painful palpation along distal end of fibula.  Passive Accessory Motion Superior Tibiofibular Joint: WNL Inferior Tibiofibular Joint: WNL Talocrural Joint Distraction: WNL Talocrural Joint AP: Hypomobile Talocrural Joint PA: WNL  VASCULAR Dorsalis pedis and posterior tibial pulses are palpable  SPECIAL TESTS Ligamentous Integrity Anterior Drawer (ATF, 10-15 plantarflexion with anterior translation): Negative Talar Tilt (CFL, inversion): Negative Eversion Stress Test (Deltoid, eversion): Negative External Rotation Test (High ankle, dorsiflexion and external rotation): Negative Squeeze Test (High ankle): Negative Impingment Sign (Dorsiflexion and eversion): Negative  Achilles Integrity Thompson Test: Negative  Fracture Screening Metatarsal Axial Loading: Negative Tap/Percussion Test: Negative Vibration Test: Not examined  Pronation/Supination Navicular Drop: Negative  Nerve Test Tarsal Tunnel Test (maximal DF, EV, toe ext with tapping over tarsal tunnel): Not examined Test for Morton's Neuroma (compress metatarsals and mobilize): Not examined  Other Windlass Mechanism Test: Not examined   Beighton Scale: Deferred   TODAY'S TREATMENT:   SUBJECTIVE: Pt reports that she is doing well today. No additional reports  of pain in ankle although she feels a tightness sensation under the ankle bone. HEP is going well. No specific questions currently.   PAIN: Denies   Manual Therapy  STM to lateral ankle, fibularis longus and brevis tendons, fibularis longus and brevis muscle bellies improved pain following STM.   Ther-ex  Nustep Level 1-5 x 10 min for warm up and interval history and change of resistance.  Ankle [place], Plantar Flexion and Dorsiflexion 1 x 10  Walking on heels in // bars x multiple lengths Walking on Forefoot in // bars x multiple lengths   Neuromuscular Re-ed Airex with Heel Raises 2 x 10; Tandem Walking on Grass x multiple length; Crossover Step with Alternate LE in front in grass x multiple length; Forward and Backward Walking with [employer] across grass x multiple lengths;   Not Performed:  Ankle Rocker Board 2 x 10 DF/PF Airex Beam Side Stepping;  Airex Beam Crossover stepping;  Airex Beam Tandem Steps;  Airex 3/4 Semi-Tandem stance both LE in front with ball toss in // bars 2 x 30 sec; Airex Static Tandem Stance without UE support 1 x 30s;  Isotonic eversion, inversion, dorsiflexion and plantarflexion 2 x 10 each; L ankle talocrural distraction, grade III, 30s/bout x 3 bouts; L ankle talocrural A/P mobilizations at available end range dorsiflexion, grade III, 30s/bout x 3 bouts; L ankle subtalar distraction, grade III, 30s/bout x  3 bouts; Seated towel scrunches x 3 minutes; Single LE on Balance Pad with Lunges BLE 4 x 6;  PATIENT EDUCATION:  Education details: Pt educated throughout session about proper posture and technique with exercises. Improved exercise technique, movement at target joints, use of target muscles after min to mod verbal, visual, tactile cues; Person educated: Patient Education method: Explanation, Demonstration, and Tactile cues Education comprehension: verbalized understanding and returned demonstration   HOME EXERCISE PROGRAM:  Access Code:  [email or web address] URL: [email or web address] Date: 12/15/2022 Prepared by: Ria Comment  Exercises - Seated Ankle Eversion with Resistance  - 1 x daily - 7 x weekly - 2 sets - 10 reps - 3s hold - Seated Ankle Inversion with Resistance and Legs Crossed (Mirrored)  - 1 x daily - 7 x weekly - 2 sets - 10 reps - 3s hold - Standing Bilateral Heel Raise on Step  - 1 x daily - 7 x weekly - 2 sets - 10 reps - Long Sitting Calf Stretch with Strap  - 1 x daily - 7 x weekly - 3 reps - 30-45s hold - Single Leg Stance  - 1 x daily - 7 x weekly - 3 reps - 30s hold   ASSESSMENT:  CLINICAL IMPRESSION: Patient arrived to physical therapy highly motivated. Progressed balance and and LE strength in today's session. Patient demonstrated ability to walk on heels and forefoot with no LOB. She tolerated on ground walking with dynamic balance and reactional tasks with no major LOB. She continues to demonstrate significant improvements in ankle strength and pain. She endorsed having significant improvements in balance while performing household tasks and yard work. Pt discussed planning for next session to discharge from physical therapy for ankle. No Updates with HEP. Pt will continue benefit from PT services to address deficits in strength, balance, and mobility in order to return to full function at home.  OBJECTIVE IMPAIRMENTS: Abnormal gait, difficulty walking, decreased ROM, and pain.   ACTIVITY LIMITATIONS: squatting and stairs  PARTICIPATION LIMITATIONS: community activity, occupation, and yard work  PERSONAL FACTORS: Age, Past/current experiences, and 3+ comorbidities: DMII, lumbar radiculopathy, and bilateral knee OA  are also affecting patient's functional outcome.   REHAB POTENTIAL: Excellent  CLINICAL DECISION MAKING: Stable/uncomplicated  EVALUATION COMPLEXITY: Low   GOALS: Goals reviewed with patient? No  SHORT TERM GOALS:  Pt will be independent with HEP to improve strength and  decrease ankle pain to improve pain-free function at home and work. Baseline:  Goal status: ACHIEVED   LONG TERM GOALS: Target date: 02/16/2023  Pt will increase FOTO to at least 60 to demonstrate significant improvement in function at home and work related to ankle pain  Baseline: 10/24/22: 38, 12/15/22: 55 Goal status: PARTIALLY MET  2.  Pt will decrease worst ankle pain by at least 3 points on the NPRS in order to demonstrate clinically significant reduction in ankle pain. Baseline: 10/24/22: worst: 10/10; 12/15/22: 4/10; Goal status: ACHIEVED  3.  Pt will report at least 75% improvement in symptoms in order demonstrate clinically significant reduction in ankle pain/disability.       Baseline: 12/01/22: 80-85% improvement; 12/15/22: At least 80% improvement Goal status: ACHIEVED  4.  Pt will be able to bend, squat, and walk across an uneven yard without an increase in her L ankle pain in order to resume working in her yard/garden as well as return to walking for exercise with less pain Baseline: 10/24/22: Notable painful in L ankle; 12/15/22: She is still lacking confidence on steps  but has improved confidence walking across her yard.  Goal status: PARTIALLY MET   PLAN: PT FREQUENCY: 1-2x/week  PT DURATION: 8 weeks  PLANNED INTERVENTIONS: Therapeutic exercises, Therapeutic activity, Neuromuscular re-education, Balance training, Gait training, Patient/Family education, Self Care, Joint mobilization, Joint manipulation, Vestibular training, Canalith repositioning, Orthotic/Fit training, DME instructions, Dry Needling, Electrical stimulation, Spinal manipulation, Spinal mobilization, Cryotherapy, Moist heat, Taping, Traction, Ultrasound, Ionotophoresis 4mg /ml Dexamethasone, Manual therapy, and Re-evaluation.  PLAN FOR NEXT SESSION: progress strengthening as tolerated, manual therapy interventions to decrease pain and improve range of motion, review/modify HEP as needed, discharge  Tzvi Economou  SPT Sharalyn Ink Huprich PT, DPT, GCS

## 2023-01-09 ENCOUNTER — Ambulatory Visit: Payer: Medicare Other

## 2023-01-09 DIAGNOSIS — M25562 Pain in left knee: Secondary | ICD-10-CM | POA: Diagnosis not present

## 2023-01-09 DIAGNOSIS — M25572 Pain in left ankle and joints of left foot: Secondary | ICD-10-CM

## 2023-01-09 DIAGNOSIS — G8929 Other chronic pain: Secondary | ICD-10-CM | POA: Diagnosis not present

## 2023-01-09 DIAGNOSIS — M6281 Muscle weakness (generalized): Secondary | ICD-10-CM

## 2023-01-09 NOTE — Therapy (Cosign Needed Addendum)
OUTPATIENT PHYSICAL THERAPY ANKLE DISCHARGE  Patient Name: Brandy [name] MRN: [id number] DOB:[birth year], 67 y.o., female Today's Date: 01/10/2023  END OF SESSION:  PT End of Session - 01/09/23 0800     Visit Number 14    Number of Visits 33    Date for PT Re-Evaluation 02/16/23    Authorization Type eval: 10/24/22, BC[employer] Medicare 2024  [id number]  [place]10 cop[place]  no auth req    Authorization Time Period eval: 08/11/22    PT Start Time 0800    PT Stop Time 0845    PT Time Calculation (min) 45 min    Activity Tolerance Patient tolerated treatment well    Behavior During Therapy [facility] for tasks assessed/performed            Past Medical History:  Diagnosis Date   Acute lower GI bleeding 01/19/2020   Arthritis    Atrial fibrillation with rapid ventricular response (HCC)    Chicken pox    Chronic systolic CHF (congestive heart failure) (HCC)    a. 03/2016 Echo: Ef 15-20%, diff HK, ant AK;  b. 05/2016 Echo: Ef 30-35%, diff HK, mildly dil LA/RA.   Diabetes mellitus without complication (HCC)    Dysrhythmia    GERD (gastroesophageal reflux disease)    Heart murmur    Hip discomfort    been going on for 40 years    History of kidney stones    Hyperlipidemia associated with type 2 diabetes mellitus (HCC) 05/12/2021   Hypertension    Moderate mitral regurgitation    a. 03/2016 Echo: mod MR in setting of LV dysfxn.   Motion sickness    car - back seat   NICM (nonischemic cardiomyopathy) (HCC)    a. 03/2016 Echo: EF 15-20%, diff HK, ant AK, mod MR, mod dil LA, mildly dil RA;  b. 04/2016 Cath: nl cors;  c. 05/2016 Echo: EF 30-35%, diff HK.   Obesity    Obstructive sleep apnea    compliant with CPAP   Persistent atrial fibrillation (HCC)    a. Dx 03/2016;  b. CHA2DS2VASc = 2-->Eliquis 5mg  BID;  b. 05/2016 Failed DCCV x 4.   Sleep apnea    Stomach irritation    Visit for monitoring Tikosyn therapy 07/24/2016   Past Surgical History:  Procedure Laterality Date   ATRIAL FIBRILLATION  ABLATION N/A 12/09/2020   Procedure: ATRIAL FIBRILLATION ABLATION;  Surgeon: Lanier Prude, MD;  Location: [facility];  Service: Cardiovascular;  Laterality: N/A;   BIOPSY N/A 01/12/2020   Procedure: BIOPSY;  Surgeon: Pasty Spillers, MD;  Location: [facility];  Service: Endoscopy;  Laterality: N/A;   CARDIAC CATHETERIZATION N/A 04/17/2016   Procedure: Left Heart Cath and Coronary Angiography;  Surgeon: Iran Ouch, MD;  Location: [facility];  Service: Cardiovascular;  Laterality: N/A;   CARDIOVERSION N/A 10/22/2020   Procedure: CARDIOVERSION;  Surgeon: Yvonne Kendall, MD;  Location: [facility] ORS;  Service: Cardiovascular;  Laterality: N/A;   CHOLECYSTECTOMY     COLON SURGERY  2021   COLONOSCOPY N/A 01/20/2020   Procedure: COLONOSCOPY;  Surgeon: Regis Bill, MD;  Location: [facility] END[facility];  Service: Endoscopy;  Laterality: N/A;   COLONOSCOPY WITH PROPOFOL N/A 01/12/2020   Procedure: COLONOSCOPY WITH PROPOFOL;  Surgeon: Pasty Spillers, MD;  Location: [facility];  Service: Endoscopy;  Laterality: N/A;  Diabetic - oral meds sleep apnea   ELECTROPHYSIOLOGIC STUDY N/A 05/26/2016   Procedure: Cardioversion;  Surgeon: Iran Ouch,  MD;  Location: [facility] ORS;  Service: Cardiovascular;  Laterality: N/A;   ESOPHAGOGASTRODUODENOSCOPY (EGD) WITH PROPOFOL N/A 01/12/2020   Procedure: ESOPHAGOGASTRODUODENOSCOPY (EGD) WITH PROPOFOL;  Surgeon: Pasty Spillers, MD;  Location: [facility];  Service: Endoscopy;  Laterality: N/A;   POLYPECTOMY N/A 01/12/2020   Procedure: POLYPECTOMY;  Surgeon: Pasty Spillers, MD;  Location: [facility];  Service: Endoscopy;  Laterality: N/A;   Patient Active Problem List   Diagnosis Date Noted   Peroneal tendinitis, left 10/13/2022   Left lumbar radiculopathy 07/26/2022   Arthralgia of left knee 07/26/2022   Pes anserinus bursitis of right knee 04/19/2022   Primary osteoarthritis of right knee  03/27/2022   Greater 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 03/27/2022   It band syndrome, right 03/27/2022   Hyperlipidemia associated with type 2 diabetes mellitus (HCC) 05/12/2021   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   Varicose veins of leg with pain, bilateral 07/06/2020   Acquired thrombophilia (HCC) 05/24/2020   Type II diabetes mellitus with complication (HCC) 01/19/2020   Gastric polyp    Colon polyps    Varicose veins of both lower extremities 10/20/2019   Dysphagia 10/20/2019   Osteoarthritis of right hand 10/31/2016   Nonischemic cardiomyopathy (HCC) 04/14/2016   BMI 33.0-33.9,adult 04/14/2016    PCP: Bari Edward MD  REFERRING PROVIDER: Joseph Berkshire MD  REFERRING DIAG: [phone] (ICD-10-CM) - Peroneal tendinitis, left   Rationale for Evaluation and Treatment: Rehabilitation  THERAPY DIAG: Pain in left ankle and joints of left foot  Muscle weakness (generalized)  ONSET DATE: 09/26/22 (approximate)  FOLLOW-UP APPT SCHEDULED WITH REFERRING PROVIDER: No, but sees PCP 12/20/22  FROM INITIAL EVALUATION SUBJECTIVE:                                                                                                                                                                                         SUBJECTIVE STATEMENT:  L lateral ankle pain.   PERTINENT HISTORY:  Pt reports onset of L lateral ankle and base of 5th metatarsal pain after exercising in mid May 2024. No trauma reported however pt states that she had started performing cable resisted hip abduction as well as walking with weights. She has history of calcaneal bone spurs and has seen podiatry but no history of surgery to L foot/ankle. She saw [name] who encouraged her to restart diclofenac scheduled fpr 2 weeks and then dose as needed. She was also encouraged to use a lace up ASO brace which she has been wearing with improvement. She was noticing improvement in her symptoms until  she stepped in a hole this past Saturday and had a  painful pop. She denies bruising or swelling afterwards. She continued walking on her LLE and over the next 24 hours noted gradual improvement.  09/19/22: Left foot 3 views AP, lateral, lateral oblique: Dorsiflex ptosis present at the midfoot level over likely the second and third tarsometatarsal joints.  Slight pes cavus foot structure.  Osteoarthritic changes present at the second and third tarsometatarsal joints.  Posterior and plantar calcaneal heel spurs present.  Mild metatarsus adductus.   PAIN:    Pain Intensity: Present: 1/10, Best: 0/10, Worst: 10/10 Pain location: L lateral ankle posterior to lateral malleoli and into L foot.  Pain Quality: burning, sharp, and aching ("under ankle bone") Radiating: No  Swelling: No  Popping, catching, locking: Yes  Numbness/Tingling: No Focal Weakness: No but has a history of feeling chronic instability in L ankle; Aggravating factors: walking without brace, walking on uneven surfaces,  Relieving factors: ice, diclofenac, gabapentin, Tylenol; 24-hour pain behavior: worse at the end of the day; History of prior back, hip, knee, or ankle injury, pain, surgery, or therapy: No Dominant hand: right Imaging: Yes, see history; Typical footwear:  Red flags: Negative for personal history of cancer, chills/fever, night sweats, nausea, vomiting, unrelenting pain): Negative  PRECAUTIONS: None  WEIGHT BEARING RESTRICTIONS: No  FALLS: Has patient fallen in last 6 months? 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   Hobbies: Pottery (currently not limited but initially had trouble sitting at the wheel),  gardening/yard work;  Patient Goals: walking across the yard with improved confidence, lifting heavier objects at home without pain in hip, working the yard, "I would like to be able to get back to walking."   OBJECTIVE:   Patient Surveys  FOTO 38, predicted improvement to 78  Cognition Patient is oriented to person, place, and time.  Recent memory is intact.  Remote memory is intact.  Attention span and concentration are intact.  Expressive speech is intact.  Patient's fund of knowledge is within normal limits for educational level.    Gross Musculoskeletal Assessment Bulk: Normal Tone: Normal No trophic changes noted to foot/ankle. No ecchymosis, erythema, or edema noted.  Patient has a high resting arch with minimal arch collapse during gait  GAIT: Comments: No excessive pronation/supination noted during gait.  Minimal pronation noted.  Gait is antalgic with decreased step length on right lower extremity.  Posture: No gross deficits in the posture noted that are contributing to the left ankle pain.  AROM AROM (Normal range in degrees) AROM   Lumbar   Flexion (65) Moderate loss of segmental flexion  Extension (30) Mild loss  Right lateral flexion (25) Mild loss  Left lateral flexion (25) Mild loss  Right rotation (30) Mild loss  Left rotation (30) Mild loss   AROM (Normal range in degrees) AROM   Hip Right Left  Flexion (125)  WNL  Extension (15)    Abduction (40)  WNL  Adduction   WNL  Internal Rotation (45)  Mod/severe loss  External Rotation (45)  Mild loss      Knee    Flexion (135)  WNL (soft end feel)  Extension (0)  0      Ankle    Dorsiflexion (20) 18 18  Plantarflexion (50) 64 54  Inversion (35) 24 22  Eversion (15) 16 14  (* = pain; Blank rows = not tested)  LE MMT: MMT (out of 5) Right  Left   Hip flexion 5 5  Hip extension    Hip abduction 28.0# (measured at last DC) 26.7# (measured at last DC)  Hip adduction    Hip internal rotation 5  5  Hip external rotation 5 5  Knee flexion 5 5  Knee extension 5 5  Ankle dorsiflexion 5 5*  Ankle plantarflexion 5 5*  Ankle inversion 5 5  Ankle eversion 5 5*  (* = pain; Blank rows = not tested)  Sensation Grossly intact to light touch throughout bilateral LEs as determined by testing dermatomes L2-S2. Proprioception, stereognosis, and hot/cold testing deferred on this date.  Palpation Generally painful to palpation around anterior and left lateral ankle.  Pain with palpation along peroneal muscles/tendons, at the base of the fifth metatarsal, and in the arch of the foot following the tendon of peroneus longus.  Pain to palpation at the sinus tarsus as well as over the deltoid ligament.  Painful palpation along distal end of fibula.  Passive Accessory Motion Superior Tibiofibular Joint: WNL Inferior Tibiofibular Joint: WNL Talocrural Joint Distraction: WNL Talocrural Joint AP: Hypomobile Talocrural Joint PA: WNL  VASCULAR Dorsalis pedis and posterior tibial pulses are palpable  SPECIAL TESTS Ligamentous Integrity Anterior Drawer (ATF, 10-15 plantarflexion with anterior translation): Negative Talar Tilt (CFL, inversion): Negative Eversion Stress Test (Deltoid, eversion): Negative External Rotation Test (High ankle, dorsiflexion and external rotation): Negative Squeeze Test (High ankle): Negative Impingment Sign (Dorsiflexion and eversion): Negative  Achilles Integrity Thompson Test: Negative  Fracture Screening Metatarsal Axial Loading: Negative Tap/Percussion Test: Negative Vibration Test: Not examined  Pronation/Supination Navicular Drop: Negative  Nerve Test Tarsal Tunnel Test (maximal DF, EV, toe ext with tapping over tarsal tunnel): Not examined Test for Morton's Neuroma (compress metatarsals and mobilize): Not examined  Other Windlass Mechanism Test: Not examined   Beighton Scale: Deferred   TODAY'S TREATMENT:   SUBJECTIVE: Patient reports busy  weekend with increased walking, and today she feels aching and pain from the hip to the foot. HEP is going well. No specific questions currently.   PAIN: 1/10   Ther-ex  Nustep Level 1-5 x 8 min for warm up and interval history and change of resistance.  Left Ankle Circles on Wobble Pad x 30s  Steps Calf Raises and and Heel Drop x 15  Forward Lunge on 12" step 1 x 10 BLE; Farmer's Marches with 4# dumbbells x multiple lengths    Neuromuscular Re-ed: Airex Beam Forward Sideways in // bars x multiple lengths  Airex Beam Forward Sideways with 6" and 12" hurdle x multiple lengths Forward Lunge on 12" step on airex 1 x 10 BLE;   Not Performed:  Ankle Rocker Board 2 x 10 DF/PF Airex Beam Crossover stepping;  Airex Beam Tandem Steps;  Airex 3/4 Semi-Tandem stance both LE in front with ball toss in // bars 2 x 30 sec; Airex Static Tandem Stance without UE support 1 x 30s;  Isotonic eversion, inversion, dorsiflexion and plantarflexion 2 x 10 each; L ankle talocrural distraction, grade III, 30s/bout x 3 bouts; L ankle talocrural A/P mobilizations at available end range dorsiflexion, grade III, 30s/bout x 3 bouts; L ankle subtalar distraction, grade III, 30s/bout x 3 bouts; Seated towel scrunches x 3 minutes; Single LE on Balance Pad with Lunges BLE 4 x 6;  PATIENT EDUCATION:  Education details: Pt educated throughout session about proper posture and technique with exercises. Improved exercise technique, movement at target joints, use of target muscles after min to mod verbal, visual, tactile cues; Person educated: Patient Education method: Explanation, Demonstration, and Tactile cues Education comprehension: verbalized understanding and returned demonstration   HOME EXERCISE PROGRAM:  Access Code: [email or web address] URL: [email or web address] Date: 12/15/2022 Prepared by: Ria Comment  Exercises - Seated Ankle Eversion with Resistance  - 1 x daily - 7 x weekly - 2 sets - 10  reps - 3s hold - Seated Ankle Inversion with Resistance and Legs Crossed (Mirrored)  - 1 x daily - 7 x weekly - 2 sets - 10 reps - 3s hold - Standing Bilateral Heel Raise on Step  - 1 x daily - 7 x weekly - 2 sets - 10 reps - Long Sitting Calf Stretch with Strap  - 1 x daily - 7 x weekly - 3 reps - 30-45s hold - Single Leg Stance  - 1 x daily - 7 x weekly - 3 reps - 30s hold   ASSESSMENT:  CLINICAL IMPRESSION: Today's session with a main focus on reassessment of goals and review of HEP. Patient has demonstrated significant improvements with ankle pain and strength. She self reported an improvement on the FOTO to 57 from 46. Her worst reported pain has improved to a 1/10 from a 4/10 since last progress note. Remainder of the session focused on continued strengthening of the left ankle. She demonstrated ability to perform single leg stance on the left without major LOB. She has endorsed significant improvements in balance and overall functional mobility related to her ankle. Based on today's performance PT recommends pt to discharge to home with HEP.   OBJECTIVE IMPAIRMENTS: Abnormal gait, difficulty walking, decreased ROM, and pain.   ACTIVITY LIMITATIONS: squatting and stairs  PARTICIPATION LIMITATIONS: community activity, occupation, and yard work  PERSONAL FACTORS: Age, Past/current experiences, and 3+ comorbidities: DMII, lumbar radiculopathy, and bilateral knee OA  are also affecting patient's functional outcome.   REHAB POTENTIAL: Excellent  CLINICAL DECISION MAKING: Stable/uncomplicated  EVALUATION COMPLEXITY: Low   GOALS: Goals reviewed with patient? No  SHORT TERM GOALS:  Pt will be independent with HEP to improve strength and decrease ankle pain to improve pain-free function at home and work. Baseline:  Goal status: ACHIEVED   LONG TERM GOALS: Target date: 02/16/2023  Pt will increase FOTO to at least 60 to demonstrate significant improvement in function at home and  work related/ to ankle pain  /Baseline: 10/24/22: 38, 12/15/22: 55, 01/09/2023: 57 Goal status: PARTIALLY MET   2.  Pt will decrease worst ankle pain by at least 3 points on the NPRS in order to demonstrate clinically significant reduction in ankle pain. Baseline: 10/24/22: worst: 10/10; 12/15/22: 4/10; 01/09/2023: 1/10  Goal status: ACHIEVED  3.  Pt will report at least 75% improvement in symptoms in order demonstrate clinically significant reduction in ankle pain/disability.       Baseline: 12/01/22: 80-85% improvement; 12/15/22: At least 80% improvement, 01/09/2023: 80%  Goal status: ACHIEVED  4.  Pt will be able to bend, squat, and walk across an uneven yard without an increase in her L ankle pain in order to resume working in her yard/garden as well as return to walking for exercise with less pain Baseline: 10/24/22: Notable painful in L ankle; 12/15/22: She is still lacking confidence on steps but has improved confidence walking across her yard. 01/09/2023: Improved pain and confidence, able to navigate  uneven terrain such as [place].  Goal status: ACHIEVED   PLAN: PT FREQUENCY: 1-2x/week  PT DURATION: 8 weeks  PLANNED INTERVENTIONS: Therapeutic exercises, Therapeutic activity, Neuromuscular re-education, Balance training, Gait training, Patient/Family education, Self Care, Joint mobilization, Joint manipulation, Vestibular training, Canalith repositioning, Orthotic/Fit training, DME instructions, Dry Needling, Electrical stimulation, Spinal manipulation, Spinal mobilization, Cryotherapy, Moist heat, Taping, Traction, Ultrasound, Ionotophoresis 4mg /ml Dexamethasone, Manual therapy, and Re-evaluation.  PLAN FOR NEXT SESSION: Review and modify HEP; Discharge  Gaspare Netzel SPT Sharalyn Ink Huprich PT, DPT, GCS

## 2023-01-18 ENCOUNTER — Ambulatory Visit: Payer: Medicare Other | Attending: Family Medicine

## 2023-01-18 DIAGNOSIS — M25562 Pain in left knee: Secondary | ICD-10-CM | POA: Diagnosis not present

## 2023-01-18 DIAGNOSIS — M6281 Muscle weakness (generalized): Secondary | ICD-10-CM | POA: Diagnosis not present

## 2023-01-18 DIAGNOSIS — G8929 Other chronic pain: Secondary | ICD-10-CM | POA: Diagnosis not present

## 2023-01-18 NOTE — Therapy (Addendum)
OUTPATIENT PHYSICAL THERAPY KNEE EVALUATION  Patient Name: Brandy [name] MRN: [id number] DOB:[birth year], 67 y.o., female Today's Date: 01/18/2023  END OF SESSION:  PT End of Session - 01/18/23 1707     Visit Number 1    Number of Visits 17    Date for PT Re-Evaluation 03/15/23    Authorization Type eval: 09/05//24, BC[employer] Medicare 2024  [place] on [place]  $10 copay  no auth req    PT Start Time 1707    PT Stop Time 1745    PT Time Calculation (min) 38 min    Activity Tolerance Patient tolerated treatment well    Behavior During Therapy [facility] for tasks assessed/performed            Past Medical History:  Diagnosis Date   Acute lower GI bleeding 01/19/2020   Arthritis    Atrial fibrillation with rapid ventricular response (HCC)    Chicken pox    Chronic systolic CHF (congestive heart failure) (HCC)    a. 03/2016 Echo: Ef 15-20%, diff HK, ant AK;  b. 05/2016 Echo: Ef 30-35%, diff HK, mildly dil LA/RA.   Diabetes mellitus without complication (HCC)    Dysrhythmia    GERD (gastroesophageal reflux disease)    Heart murmur    Hip discomfort    been going on for 40 years    History of kidney stones    Hyperlipidemia associated with type 2 diabetes mellitus (HCC) 05/12/2021   Hypertension    Moderate mitral regurgitation    a. 03/2016 Echo: mod MR in setting of LV dysfxn.   Motion sickness    car - back seat   NICM (nonischemic cardiomyopathy) (HCC)    a. 03/2016 Echo: EF 15-20%, diff HK, ant AK, mod MR, mod dil LA, mildly dil RA;  b. 04/2016 Cath: nl cors;  c. 05/2016 Echo: EF 30-35%, diff HK.   Obesity    Obstructive sleep apnea    compliant with CPAP   Persistent atrial fibrillation (HCC)    a. Dx 03/2016;  b. CHA2DS2VASc = 2-->Eliquis 5mg  BID;  b. 05/2016 Failed DCCV x 4.   Sleep apnea    Stomach irritation    Visit for monitoring Tikosyn therapy 07/24/2016   Past Surgical History:  Procedure Laterality Date   ATRIAL FIBRILLATION ABLATION N/A 12/09/2020   Procedure: ATRIAL  FIBRILLATION ABLATION;  Surgeon: Lanier Prude, MD;  Location: [facility] IN[facility]ASIVE CV [facility];  Service: Cardiovascular;  Laterality: N/A;   BIOPSY N/A 01/12/2020   Procedure: BIOPSY;  Surgeon: Pasty Spillers, MD;  Location: [facility];  Service: Endoscopy;  Laterality: N/A;   CARDIAC CATHETERIZATION N/A 04/17/2016   Procedure: Left Heart Cath and Coronary Angiography;  Surgeon: Iran Ouch, MD;  Location: [facility] INVASIVE CV LAB;  Service: Cardiovascular;  Laterality: N/A;   CARDIOVERSION N/A 10/22/2020   Procedure: CARDIOVERSION;  Surgeon: Yvonne Kendall, MD;  Location: [facility] ORS;  Service: Cardiovascular;  Laterality: N/A;   CHOLECYSTECTOMY     COLON SURGERY  2021   COLONOSCOPY N/A 01/20/2020   Procedure: COLONOSCOPY;  Surgeon: Regis Bill, MD;  Location: [facility] ENDOSCOPY;  Service: Endoscopy;  Laterality: N/A;   COLONOSCOPY WITH PROPOFOL N/A 01/12/2020   Procedure: COLONOSCOPY WITH PROPOFOL;  Surgeon: Pasty Spillers, MD;  Location: [facility];  Service: Endoscopy;  Laterality: N/A;  Diabetic - oral meds sleep apnea   ELECTROPHYSIOLOGIC STUDY N/A 05/26/2016   Procedure: Cardioversion;  Surgeon: Iran Ouch, MD;  Location: [facility] ORS;  Service: Cardiovascular;  Laterality: N/A;   ESOPHAGOGASTRODUODENOSCOPY (EGD) WITH PROPOFOL N/A 01/12/2020   Procedure: ESOPHAGOGASTRODUODENOSCOPY (EGD) WITH PROPOFOL;  Surgeon: Pasty Spillers, MD;  Location: [facility];  Service: Endoscopy;  Laterality: N/A;   POLYPECTOMY N/A 01/12/2020   Procedure: POLYPECTOMY;  Surgeon: Pasty Spillers, MD;  Location: [facility];  Service: Endoscopy;  Laterality: N/A;   Patient Active Problem List   Diagnosis Date Noted   Peroneal tendinitis, left 10/13/2022   Left lumbar radiculopathy 07/26/2022   Arthralgia of left knee 07/26/2022   Pes anserinus bursitis of right knee 04/19/2022   Primary osteoarthritis of right knee 03/27/2022   Greater trochanteric pain  syndrome of right lower extremity 03/27/2022   It band syndrome, right 03/27/2022   Hyperlipidemia associated with type 2 diabetes mellitus (HCC) 05/12/2021   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   Varicose veins of leg with pain, bilateral 07/06/2020   Acquired thrombophilia (HCC) 05/24/2020   Type II diabetes mellitus with complication (HCC) 01/19/2020   Gastric polyp    Colon polyps    Varicose veins of both lower extremities 10/20/2019   Dysphagia 10/20/2019   Osteoarthritis of right hand 10/31/2016   Nonischemic cardiomyopathy (HCC) 04/14/2016   BMI 33.0-33.9,adult 04/14/2016    PCP: Reubin Milan, MD  REFERRING PROVIDER: [name], MD  REFERRING DIAG: [phone] (ICD-10-CM) - Peroneal tendinitis, left   RATIONALE FOR EVALUATION AND TREATMENT: Rehabilitation  THERAPY DIAG: Left Knee Pain  ONSET DATE: Chronic Left Knee Pain   FOLLOW-UP APPT SCHEDULED WITH REFERRING PROVIDER: No    SUBJECTIVE:                                                                                                                                                                                         SUBJECTIVE STATEMENT:  Left knee pain   PERTINENT HISTORY:  Patient reports onset of progressing left knee pain within the last couple months. Patient denies trauma to the knee and has started using a compression sleeve for stability. She has had a history of left knee pain with improvement via physical therapy but rehab for the knee ended early due to pain at the ankle. She endorsed that imaging is significant for minimal space along the joint and she experiences an occasional "catch" or "pop". She denies swelling, bruising or radiating pain below the knee.    PAIN:   Pain Intensity: Present: 0/10, Best: 0/10, Worst: 3/10 Pain location: Left knee  Pain quality:  Catching   Radiating pain: Yes  Swelling: No  Popping, catching, locking: Yes  Numbness/Tingling: No Focal weakness  or buckling: Yes Aggravating factors: Stairway, Prolonged Walking, Prolonged  sitting  Relieving factors: Rest, Ice, Compression Sleeve, Massage  24-hour pain behavior: AM: "Wobbly" and "Stiff"  PM: Activity Dependent, more activity or walking aggravates the pain History of prior back, hip, or knee injury, pain, surgery, or therapy: Yes, improvement with PT for chronic knee pain Dominant hand: right Imaging: Yes, "significant for minimal spacing between tibia and femur" Prior level of function: Independent  Occupational demands: Works at [employer] part time; Hobbies: Gardening, Yard work, Reading, [employer] flags: Negative for personal history of cancer, chills/fever, night sweats, nausea, vomiting, unexplained weight gain/loss, unrelenting pain  PRECAUTIONS: None  WEIGHT BEARING RESTRICTIONS: No  FALLS: Has patient fallen in last 6 months? No  Living Environment Lives with: lives alone Lives in: House/apartment  Patient Goals: "I would like for my knee pain to reduce and gain more stability.    OBJECTIVE:   Patient Surveys  FOTO 47, predicted to 45  Cognition Patient is oriented to person, place, and time.  Recent memory is intact.  Remote memory is intact.  Attention span and concentration are intact.  Expressive speech is intact.  Patient's fund of knowledge is within normal limits for educational level.    Gross Musculoskeletal Assessment Tremor: None Bulk: Normal Tone: Normal  GAIT: Deferred   Posture: Deferred  AROM AROM (Normal range in degrees) AROM  Hip Right Left  Flexion (125)    Extension (15)    Abduction (40)    Adduction     Internal Rotation (45)    External Rotation (45)        Knee    Flexion (135)    Extension (0)        Ankle    Dorsiflexion (20)    Plantarflexion (50)    Inversion (35)    Eversion (15    (* = pain; Blank rows = not tested)  LE MMT: MMT (out of 5) Right Left  Hip flexion 4 4  Hip extension     Hip abduction    Hip adduction    Hip internal rotation 5 4  Hip external rotation 5 4+  Knee flexion 5 5  Knee extension 5 5  Ankle dorsiflexion 5 5  Ankle plantarflexion 5 5  Ankle inversion    Ankle eversion    (* = pain; Blank rows = not tested)  Sensation Grossly intact to light touch bilateral LEs as determined by testing dermatomes L2-S2. Proprioception, and hot/cold testing deferred on this date.  Reflexes (R/L) KJ: 2+/2+  Muscle Length Hamstrings: Deferred Quadriceps Michela Pitcher): Deferred Hip flexors Maisie Fus): Deferred IT band Claiborne Rigg): R: Deferred L: Negative  Palpation  Location LEFT  RIGHT           Quadriceps 1 (lateral aspect)    Medial Hamstrings 0   Lateral Hamstrings 0   Lateral Hamstring tendon 1   Medial Hamstring tendon 1   Quadriceps tendon 0   Patella 0   Patellar Tendon 0   Tibial Tuberosity    Medial joint line 1   Lateral joint line 1   MCL    LCL    Adductor Tubercle    Pes Anserine tendon 1   Infrapatellar fat pad 0   Fibular head 1   Popliteal fossa 0   IT band  2   (Blank rows = not tested) Graded on 0-4 scale (0 = no pain, 1 = pain, 2 = pain with wincing/grimacing/flinching, 3 = pain with withdrawal, 4 = unwilling to allow palpation), (Blank  rows = not tested)  Passive Accessory Motion Tibiofemoral: Deferred  Fibula on Femur: Deferred  Patellofemoral: Superior Glide: R: Negative L: Negative Inferior Glide: R: Negative L: Negative Medial Glide: R: Negative L: Negative Lateral Glide: R: Negative L: Negative  Medial Tilt: R: Negative L: Negative Lateral Tilt: R: Not done L: Positive for discomfort   SPECIAL TESTS  Ligamentous Stability  ACL: Lachman's: R: Negative L: Negative  PCL: Posterior Drawer: R: Negative L: Negative  MCL: Valgus Stress (30 degrees flexion): R: Negative L: Negative  LCL: Varus Stress (30 degrees flexion): R: Negative L: Negative  Meniscus Tests McMurray's Test: Negative Steinmann Sign I: R:  Negative L: Negative  Patellofemoral Pain Syndrome Patellar Tilt (Lateral): R: negative  L: Negative Squatting pain: Deferred Stair climbing pain: Deferred  Patellar Tendinopathy Deferred     TODAY'S TREATMENT: Deferred  PATIENT EDUCATION:  Education details: Plan of Care and HEP Person educated: Patient Education method: Explanation, Demonstration, and Handouts Education comprehension: verbalized understanding   HOME EXERCISE PROGRAM:  Access Code: [email or web address] URL: [email or web address] Date: 01/18/2023 Prepared by: Ria Comment Exercises - Seated Hamstring Stretch  - 1 x daily - 7 x weekly - 3 sets - 30 hold - Sidelying Quadriceps Stretch  - 1 x daily - 7 x weekly - 3 sets - 10 reps - 30 hold - Supine Piriformis Stretch  - 1 x daily - 7 x weekly - 3 sets - 30 hold - Supine Bridge  - 1 x daily - 7 x weekly - 2-3 sets - 10 reps - 5 hold - Seated Hip Abduction with Resistance  - 1 x daily - 7 x weekly - 2-3 sets - 10 reps - 5 hold - Mini Squat with Counter Support  - 1 x daily - 7 x weekly - 2-3 sets - 10 reps - 5 hold   ASSESSMENT:  CLINICAL IMPRESSION: Patient is a 67 y.o. female who was seen today for physical therapy evaluation and treatment for left knee pain. She presents with diffuse pain along medial aspect and lateral aspect of the knee. Objective findings significant for pain along left IT band, pes anserine, and lateral aspect of knee. She is independent in all functional mobility and ADLs without use of AD. Currently, her pain limits prolonged walking, squatting, bending, stairway navigation and prolonged sitting. Joint assessment and ligamentous testing were normal and without report of pain. Patient reported that pain causes lack of confidence when performing demanding tasks and she occasionally wears a sleeve. Based on today's performance, the patient presents with decreased strength, stability and pain. She will benefit from 1x/week focused on  stretching and strengthening the LE in order to improve functional mobility and return to PLOF.   OBJECTIVE IMPAIRMENTS: decreased activity tolerance, decreased balance, difficulty walking, decreased strength, and pain.   ACTIVITY LIMITATIONS: bending, sitting, standing, squatting, and stairs  PARTICIPATION LIMITATIONS: shopping, community activity, and yard work  PERSONAL FACTORS: Age, Fitness, Past/current experiences, Time since onset of injury/illness/exacerbation, and 3+ comorbidities: OA, a-fib, OSA, and CHF  are also affecting patient's functional outcome.   REHAB POTENTIAL: Good  CLINICAL DECISION MAKING: Evolving/moderate complexity  EVALUATION COMPLEXITY: Moderate   GOALS: Goals reviewed with patient? Yes  SHORT TERM GOALS: Target date: 02/15/2023  Pt will be independent with HEP to improve strength and decrease knee pain to improve pain-free function at home and work. Baseline:  Goal status: INITIAL    LONG TERM GOALS: Target date: 03/15/2023  Pt will increase FOTO to at least 58  to demonstrate significant improvement in function at home and work related to knee pain  Baseline: 01/18/23: 47 Goal status: INITIAL  2.  Pt will decrease worst knee pain by at least 3 points on the NPRS in order to demonstrate clinically significant reduction in knee pain. Baseline: 01/18/2023: 3/10 Goal status: INITIAL  3.  Pt will increase strength of hip flexion and hip abduction by at least 1/2 MMT grade in order to demonstrate improvement in strength and function  Baseline:  Goal status: INITIAL   PLAN: PT FREQUENCY: 1-2x/week  PT DURATION: 8 weeks  PLANNED INTERVENTIONS: Therapeutic exercises, Therapeutic activity, Neuromuscular re-education, Balance training, Gait training, Patient/Family education, Self Care, Joint mobilization, Joint manipulation, Vestibular training, Canalith repositioning, Orthotic/Fit training, DME instructions, Dry Needling, Electrical stimulation,  Spinal manipulation, Spinal mobilization, Cryotherapy, Moist heat, Taping, Traction, Ultrasound, Ionotophoresis 4mg /ml Dexamethasone, Manual therapy, and Re-evaluation.  PLAN FOR NEXT SESSION: Hip and Knee ROM, Hip Abduction and Adduction, Progress Hip and knee strengthening, check hamstring length and hip flexors   Sharalyn Ink Huprich PT, DPT, GCS  Kurk Corniel, SPT

## 2023-01-19 ENCOUNTER — Ambulatory Visit (INDEPENDENT_AMBULATORY_CARE_PROVIDER_SITE_OTHER): Payer: Medicare Other

## 2023-01-19 DIAGNOSIS — I428 Other cardiomyopathies: Secondary | ICD-10-CM

## 2023-01-23 ENCOUNTER — Other Ambulatory Visit: Payer: Self-pay | Admitting: Internal Medicine

## 2023-01-23 ENCOUNTER — Ambulatory Visit: Payer: Medicare Other

## 2023-01-23 DIAGNOSIS — M25562 Pain in left knee: Secondary | ICD-10-CM | POA: Diagnosis not present

## 2023-01-23 DIAGNOSIS — M6281 Muscle weakness (generalized): Secondary | ICD-10-CM | POA: Diagnosis not present

## 2023-01-23 DIAGNOSIS — G8929 Other chronic pain: Secondary | ICD-10-CM | POA: Diagnosis not present

## 2023-01-23 DIAGNOSIS — E118 Type 2 diabetes mellitus with unspecified complications: Secondary | ICD-10-CM

## 2023-01-23 LAB — CUP PACEART REMOTE DEVICE CHECK
Date Time Interrogation Session: 20240906230624
Implantable Pulse Generator Implant Date: 20230928

## 2023-01-23 NOTE — Progress Notes (Signed)
Care[facility] Summary Report / Loop Recorder 

## 2023-01-23 NOTE — Therapy (Cosign Needed Addendum)
OUTPATIENT PHYSICAL THERAPY KNEE TREATMENT  Patient Name: Brandy [name] MRN: [id number] DOB:01/17/1956, 67 y.o., female Today's Date: 01/24/2023  END OF SESSION:  PT End of Session - 01/23/23 0851     Visit Number 2    Number of Visits 17    Date for PT Re-Evaluation 03/15/23    Authorization Type eval: 09/05//24, BC[employer] Medicare 2024  [place]  $10 copay  no auth req    PT Start Time 0850    PT Stop Time 0930    PT Time Calculation (min) 40 min    Activity Tolerance Patient tolerated treatment well    Behavior During Therapy [facility] for tasks assessed/performed            Past Medical History:  Diagnosis Date   Acute lower GI bleeding 01/19/2020   Arthritis    Atrial fibrillation with rapid ventricular response (HCC)    Chicken pox    Chronic systolic CHF (congestive heart failure) (HCC)    a. 03/2016 Echo: Ef 15-20%, diff HK, ant AK;  b. 05/2016 Echo: Ef 30-35%, diff HK, mildly dil LA/RA.   Diabetes mellitus without complication (HCC)    Dysrhythmia    GERD (gastroesophageal reflux disease)    Heart murmur    Hip discomfort    been going on for 40 years    History of kidney stones    Hyperlipidemia associated with type 2 diabetes mellitus (HCC) 05/12/2021   Hypertension    Moderate mitral regurgitation    a. 03/2016 Echo: mod MR in setting of LV dysfxn.   Motion sickness    car - back seat   NICM (nonischemic cardiomyopathy) (HCC)    a. 03/2016 Echo: EF 15-20%, diff HK, ant AK, mod MR, mod dil LA, mildly dil RA;  b. 04/2016 Cath: nl cors;  c. 05/2016 Echo: EF 30-35%, diff HK.   Obesity    Obstructive sleep apnea    compliant with CPAP   Persistent atrial fibrillation (HCC)    a. Dx 03/2016;  b. CHA2DS2VASc = 2-->Eliquis 5mg  BID;  b. 05/2016 Failed DCCV x 4.   Sleep apnea    Stomach irritation    Visit for monitoring Tikosyn therapy 07/24/2016   Past Surgical History:  Procedure Laterality Date   ATRIAL FIBRILLATION ABLATION N/A 12/09/2020   Procedure: ATRIAL  FIBRILLATION ABLATION;  Surgeon: Lanier Prude, MD;  Location: [facility] INVASIVE CV LAB;  Service: Cardiovascular;  Laterality: N/A;   BIOPSY N/A 01/12/2020   Procedure: BIOPSY;  Surgeon: Pasty Spillers, MD;  Location: [facility];  Service: Endoscopy;  Laterality: N/A;   CARDIAC CATHETERIZATION N/A 04/17/2016   Procedure: Left Heart Cath and Coronary Angiography;  Surgeon: Iran Ouch, MD;  Location: [facility] INVASIVE CV LAB;  Service: Cardiovascular;  Laterality: N/A;   CARDIOVERSION N/A 10/22/2020   Procedure: CARDIOVERSION;  Surgeon: Yvonne Kendall, MD;  Location: [facility] ORS;  Service: Cardiovascular;  Laterality: N/A;   CHOLECYSTECTOMY     COLON SURGERY  2021   COLONOSCOPY N/A 01/20/2020   Procedure: COLONOSCOPY;  Surgeon: Regis Bill, MD;  Location: [facility] ENDOSCOPY;  Service: Endoscopy;  Laterality: N/A;   COLONOSCOPY WITH PROPOFOL N/A 01/12/2020   Procedure: COLONOSCOPY WITH PROPOFOL;  Surgeon: Pasty Spillers, MD;  Location: [facility];  Service: Endoscopy;  Laterality: N/A;  Diabetic - oral meds sleep apnea   ELECTROPHYSIOLOGIC STUDY N/A 05/26/2016   Procedure: Cardioversion;  Surgeon: Iran Ouch, MD;  Location: [facility] ORS;  Service: Cardiovascular;  Laterality: N/A;   ESOPHAGOGASTRODUODENOSCOPY (EGD) WITH PROPOFOL N/A 01/12/2020   Procedure: ESOPHAGOGASTRODUODENOSCOPY (EGD) WITH PROPOFOL;  Surgeon: Pasty Spillers, MD;  Location: [facility];  Service: Endoscopy;  Laterality: N/A;   POLYPECTOMY N/A 01/12/2020   Procedure: POLYPECTOMY;  Surgeon: Pasty Spillers, MD;  Location: [facility];  Service: Endoscopy;  Laterality: N/A;   Patient Active Problem List   Diagnosis Date Noted   Peroneal tendinitis, left 10/13/2022   Left lumbar radiculopathy 07/26/2022   Arthralgia of left knee 07/26/2022   Pes anserinus bursitis of right knee 04/19/2022   Primary osteoarthritis of right knee 03/27/2022   Greater trochanteric pain  syndrome of right lower extremity 03/27/2022   It band syndrome, right 03/27/2022   Hyperlipidemia associated with type 2 diabetes mellitus (HCC) 05/12/2021   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   Varicose veins of leg with pain, bilateral 07/06/2020   Acquired thrombophilia (HCC) 05/24/2020   Type II diabetes mellitus with complication (HCC) 01/19/2020   Gastric polyp    Colon polyps    Varicose veins of both lower extremities 10/20/2019   Dysphagia 10/20/2019   Osteoarthritis of right hand 10/31/2016   Nonischemic cardiomyopathy (HCC) 04/14/2016   BMI 33.0-33.9,adult 04/14/2016    PCP: Reubin Milan, MD  REFERRING PROVIDER: [name], MD  REFERRING DIAG: [phone] (ICD-10-CM) - Peroneal tendinitis, left   RATIONALE FOR EVALUATION AND TREATMENT: Rehabilitation  THERAPY DIAG: Left Knee Pain  ONSET DATE: Chronic Left Knee Pain   FOLLOW-UP APPT SCHEDULED WITH REFERRING PROVIDER: No    SUBJECTIVE:                                                                                                                                                                                         SUBJECTIVE STATEMENT:  Left knee pain   PERTINENT HISTORY:  Patient reports onset of progressing left knee pain within the last couple months. Patient denies trauma to the knee and has started using a compression sleeve for stability. She has had a history of left knee pain with improvement via physical therapy but rehab for the knee ended early due to pain at the ankle. She endorsed that imaging is significant for minimal space along the joint and she experiences an occasional "catch" or "pop". She denies swelling, bruising or radiating pain below the knee.    PAIN:   Pain Intensity: Present: 0/10, Best: 0/10, Worst: 3/10 Pain location: Left knee  Pain quality:  Catching   Radiating pain: Yes  Swelling: No  Popping, catching, locking: Yes  Numbness/Tingling: No Focal weakness  or buckling: Yes Aggravating factors: Stairway, Prolonged Walking, Prolonged  sitting  Relieving factors: Rest, Ice, Compression Sleeve, Massage  24-hour pain behavior: AM: "Wobbly" and "Stiff"  PM: Activity Dependent, more activity or walking aggravates the pain History of prior back, hip, or knee injury, pain, surgery, or therapy: Yes, improvement with PT for chronic knee pain Dominant hand: right Imaging: Yes, "significant for minimal spacing between tibia and femur" Prior level of function: Independent  Occupational demands: Works at [employer] part time; Hobbies: Gardening, Yard work, Reading, [employer] flags: Negative for personal history of cancer, chills/fever, night sweats, nausea, vomiting, unexplained weight gain/loss, unrelenting pain  PRECAUTIONS: None  WEIGHT BEARING RESTRICTIONS: No  FALLS: Has patient fallen in last 6 months? No  Living Environment Lives with: lives alone Lives in: House/apartment  Patient Goals: "I would like for my knee pain to reduce and gain more stability.    OBJECTIVE:   Patient Surveys  FOTO 47, predicted to 29  Cognition Patient is oriented to person, place, and time.  Recent memory is intact.  Remote memory is intact.  Attention span and concentration are intact.  Expressive speech is intact.  Patient's fund of knowledge is within normal limits for educational level.    Gross Musculoskeletal Assessment Tremor: None Bulk: Normal Tone: Normal  GAIT: Deferred   Posture: Deferred  AROM AROM (Normal range in degrees) AROM  Hip Right Left  Flexion (125)    Extension (15)    Abduction (40)    Adduction     Internal Rotation (45)    External Rotation (45)        Knee    Flexion (135)    Extension (0)        Ankle    Dorsiflexion (20)    Plantarflexion (50)    Inversion (35)    Eversion (15    (* = pain; Blank rows = not tested)  LE MMT: MMT (out of 5) Right Left  Hip flexion 4 4  Hip extension     Hip abduction    Hip adduction    Hip internal rotation 5 4  Hip external rotation 5 4+  Knee flexion 5 5  Knee extension 5 5  Ankle dorsiflexion 5 5  Ankle plantarflexion 5 5  Ankle inversion    Ankle eversion    (* = pain; Blank rows = not tested)  Sensation Grossly intact to light touch bilateral LEs as determined by testing dermatomes L2-S2. Proprioception, and hot/cold testing deferred on this date.  Reflexes (R/L) KJ: 2+/2+  Muscle Length Hamstrings: Deferred Quadriceps Michela Pitcher): Deferred Hip flexors Maisie Fus): Deferred IT band Claiborne Rigg): R: Deferred L: Negative  Palpation  Location LEFT  RIGHT           Quadriceps 1 (lateral aspect)    Medial Hamstrings 0   Lateral Hamstrings 0   Lateral Hamstring tendon 1   Medial Hamstring tendon 1   Quadriceps tendon 0   Patella 0   Patellar Tendon 0   Tibial Tuberosity    Medial joint line 1   Lateral joint line 1   MCL    LCL    Adductor Tubercle    Pes Anserine tendon 1   Infrapatellar fat pad 0   Fibular head 1   Popliteal fossa 0   IT band  2   (Blank rows = not tested) Graded on 0-4 scale (0 = no pain, 1 = pain, 2 = pain with wincing/grimacing/flinching, 3 = pain with withdrawal, 4 = unwilling to allow palpation), (Blank  rows = not tested)  Passive Accessory Motion Tibiofemoral: Deferred  Fibula on Femur: Deferred  Patellofemoral: Superior Glide: R: Negative L: Negative Inferior Glide: R: Negative L: Negative Medial Glide: R: Negative L: Negative Lateral Glide: R: Negative L: Negative  Medial Tilt: R: Negative L: Negative Lateral Tilt: R: Not done L: Positive for discomfort   SPECIAL TESTS  Ligamentous Stability  ACL: Lachman's: R: Negative L: Negative  PCL: Posterior Drawer: R: Negative L: Negative  MCL: Valgus Stress (30 degrees flexion): R: Negative L: Negative  LCL: Varus Stress (30 degrees flexion): R: Negative L: Negative  Meniscus Tests McMurray's Test: Negative Steinmann Sign I: R:  Negative L: Negative  Patellofemoral Pain Syndrome Patellar Tilt (Lateral): R: negative  L: Negative Squatting pain: Deferred Stair climbing pain: Deferred  Patellar Tendinopathy Deferred     TODAY'S TREATMENT:  Subjective: Patient reports pain with stair navigation this morning, currently no resting pain. No further questions or concerns  Pain: 0/10 Resting pain  Therex:  Thomas Test and Hamstring Length Test: WNL  Seated LAQ and Knee Flexion against manual resistance 2 x 10 ea direction BLE;  Sit to stand with 8# medicine ball 2 x 10;  6" Step up, No UE support 2 x 10 BLE; Total Gym, Level 22  2 x 10 BLE;  Partial Lunge at stairs 1 x 10 BLE;  Educated on proper squat form   PATIENT EDUCATION:  Education details: Plan of Care and HEP Person educated: Patient Education method: Explanation, Demonstration, and Handouts Education comprehension: verbalized understanding   HOME EXERCISE PROGRAM:  Access Code: [email or web address] URL: [email or web address] Date: 01/24/2023 Prepared by: Ria Comment  Exercises - Seated Hamstring Stretch  - 1 x daily - 7 x weekly - 3 sets - 30 hold - Sidelying Quadriceps Stretch  - 1 x daily - 7 x weekly - 3 sets - 10 reps - 30 hold - Supine Piriformis Stretch  - 1 x daily - 7 x weekly - 3 sets - 30 hold - Supine Bridge  - 1 x daily - 7 x weekly - 2-3 sets - 10 reps - 5 hold - Seated Hip Abduction with Resistance  - 1 x daily - 7 x weekly - 2-3 sets - 10 reps - 5 hold - Mini Squat with Counter Support  - 1 x daily - 7 x weekly - 2-3 sets - 10 reps - 5 hold   ASSESSMENT:  CLINICAL IMPRESSION: Patient reported to physical therapy for first follow up highly motivated. Progressed LE strengthening in today's session. Patient presented with moderate weakness in RLE when compared to the LLE. Pt tolerated progression of partial squat in total gym to 90 degrees of knee flexion without report of pain. PT educated patient on proper squat form in  order to improve functional mobility when gardening. Based on today's performance, the patient will benefit from continued skilled physical therapy focused on stretching and strengthening the LE in order to improve functional mobility and return to PLOF.   OBJECTIVE IMPAIRMENTS: decreased activity tolerance, decreased balance, difficulty walking, decreased strength, and pain.   ACTIVITY LIMITATIONS: bending, sitting, standing, squatting, and stairs  PARTICIPATION LIMITATIONS: shopping, community activity, and yard work  PERSONAL FACTORS: Age, Fitness, Past/current experiences, Time since onset of injury/illness/exacerbation, and 3+ comorbidities: OA, a-fib, OSA, and CHF  are also affecting patient's functional outcome.   REHAB POTENTIAL: Good  CLINICAL DECISION MAKING: Evolving/moderate complexity  EVALUATION COMPLEXITY: Moderate   GOALS: Goals reviewed with patient? Yes  SHORT TERM GOALS:  Target date: 02/15/2023  Pt will be independent with HEP to improve strength and decrease knee pain to improve pain-free function at home and work. Baseline:  Goal status: INITIAL    LONG TERM GOALS: Target date: 03/15/2023  Pt will increase FOTO to at least 58 to demonstrate significant improvement in function at home and work related to knee pain  Baseline: 01/18/23: 47 Goal status: INITIAL  2.  Pt will decrease worst knee pain by at least 3 points on the NPRS in order to demonstrate clinically significant reduction in knee pain. Baseline: 01/18/2023: 3/10 Goal status: INITIAL  3.  Pt will increase strength of hip flexion and hip abduction by at least 1/2 MMT grade in order to demonstrate improvement in strength and function  Baseline:  Goal status: INITIAL   PLAN: PT FREQUENCY: 1-2x/week  PT DURATION: 8 weeks  PLANNED INTERVENTIONS: Therapeutic exercises, Therapeutic activity, Neuromuscular re-education, Balance training, Gait training, Patient/Family education, Self Care, Joint  mobilization, Joint manipulation, Vestibular training, Canalith repositioning, Orthotic/Fit training, DME instructions, Dry Needling, Electrical stimulation, Spinal manipulation, Spinal mobilization, Cryotherapy, Moist heat, Taping, Traction, Ultrasound, Ionotophoresis 4mg /ml Dexamethasone, Manual therapy, and Re-evaluation.  PLAN FOR NEXT SESSION: Hip and Knee ROM, Hip Abduction and Adduction, Progress Hip and knee strengthening,     Sharalyn Ink Huprich PT, DPT, GCS  Shayma Pfefferle, SPT

## 2023-01-30 ENCOUNTER — Ambulatory Visit: Payer: Medicare Other

## 2023-01-30 DIAGNOSIS — M6281 Muscle weakness (generalized): Secondary | ICD-10-CM

## 2023-01-30 DIAGNOSIS — G8929 Other chronic pain: Secondary | ICD-10-CM | POA: Diagnosis not present

## 2023-01-30 DIAGNOSIS — M25562 Pain in left knee: Secondary | ICD-10-CM | POA: Diagnosis not present

## 2023-01-30 NOTE — Therapy (Cosign Needed Addendum)
OUTPATIENT PHYSICAL THERAPY KNEE TREATMENT  Patient Name: Brandy [name] MRN: [id number] DOB:19-May-1955, 67 y.o., female Today's Date: 01/31/2023  END OF SESSION:  PT End of Session - 01/30/23 0849     Visit Number 3    Number of Visits 17    Date for PT Re-Evaluation 03/15/23    Authorization Type eval: 09/05//24, BC[employer] Medicare 2024  [id number] on [place]  $10 copay  no auth req    PT Start Time [phone]    PT Stop Time 0930    PT Time Calculation (min) 44 min    Activity Tolerance Patient tolerated treatment well    Behavior During Therapy [facility] for tasks assessed/performed            Past Medical History:  Diagnosis Date   Acute lower GI bleeding 01/19/2020   Arthritis    Atrial fibrillation with rapid ventricular response (HCC)    Chicken pox    Chronic systolic CHF (congestive heart failure) (HCC)    a. 03/2016 Echo: Ef 15-20%, diff HK, ant AK;  b. 05/2016 Echo: Ef 30-35%, diff HK, mildly dil LA/RA.   Diabetes mellitus without complication (HCC)    Dysrhythmia    GERD (gastroesophageal reflux disease)    Heart murmur    Hip discomfort    been going on for 40 years    History of kidney stones    Hyperlipidemia associated with type 2 diabetes mellitus (HCC) 05/12/2021   Hypertension    Moderate mitral regurgitation    a. 03/2016 Echo: mod MR in setting of LV dysfxn.   Motion sickness    car - back seat   NICM (nonischemic cardiomyopathy) (HCC)    a. 03/2016 Echo: EF 15-20%, diff HK, ant AK, mod MR, mod dil LA, mildly dil RA;  b. 04/2016 Cath: nl cors;  c. 05/2016 Echo: EF 30-35%, diff HK.   Obesity    Obstructive sleep apnea    compliant with CPAP   Persistent atrial fibrillation (HCC)    a. Dx 03/2016;  b. CHA2DS2VASc = 2-->Eliquis 5mg  BID;  b. 05/2016 Failed DCCV x 4.   Sleep apnea    Stomach irritation    Visit for monitoring Tikosyn therapy 07/24/2016   Past Surgical History:  Procedure Laterality Date   ATRIAL FIBRILLATION ABLATION N/A 12/09/2020   Procedure: ATRIAL  FIBRILLATION ABLATION;  Surgeon: Lanier Prude, MD;  [facility]: [facility];  Service: Cardiovascular;  Laterality: N/A;   BIOPSY N/A 01/12/2020   Procedure: BIOPSY;  Surgeon: Pasty Spillers, MD;  Location: [facility];  Service: Endoscopy;  Laterality: N/A;   CARDIAC CATHETERIZATION N/A 04/17/2016   Procedure: Left Heart Cath and Coronary Angiography;  Surgeon: Iran Ouch, MD;  Location: [facility] INVASIVE CV LAB;  Service: Cardiovascular;  Laterality: N/A;   CARDIOVERSION N/A 10/22/2020   Procedure: CARDIOVERSION;  Surgeon: Yvonne Kendall, MD;  Location: [facility] ORS;  Service: Cardiovascular;  Laterality: N/A;   CHOLECYSTECTOMY     COLON SURGERY  2021   COLONOSCOPY N/A 01/20/2020   Procedure: COLONOSCOPY;  Surgeon: Regis Bill, MD;  Location: [facility] ENDOS[facility];  Service: Endoscopy;  Laterality: N/A;   COLONOSCOPY WITH PROPOFOL N/A 01/12/2020   Procedure: COLONOSCOPY WITH PROPOFOL;  Surgeon: Pasty Spillers, MD;  Location: [facility];  Service: Endoscopy;  Laterality: N/A;  Diabetic - oral meds sleep apnea   ELECTROPHYSIOLOGIC STUDY N/A 05/26/2016   Procedure: Cardioversion;  Surgeon: Iran Ouch, MD;  Location: [facility] OR[facility];  Service: Cardiovascular;  Laterality: N/A;   ESOPHAGOGASTRODUODENOSCOPY (EGD) WITH PROPOFOL N/A 01/12/2020   Procedure: ESOPHAGOGASTRODUODENOSCOPY (EGD) WITH PROPOFOL;  Surgeon: Pasty Spillers, MD;  Location: [facility];  Service: Endoscopy;  Laterality: N/A;   POLYPECTOMY N/A 01/12/2020   Procedure: POLYPECTOMY;  Surgeon: Pasty Spillers, MD;  Location: [facility];  Service: Endoscopy;  Laterality: N/A;   Patient Active Problem List   Diagnosis Date Noted   Peroneal tendinitis, left 10/13/2022   Left lumbar radiculopathy 07/26/2022   Arthralgia of left knee 07/26/2022   Pes anserinus bursitis of right knee 04/19/2022   Primary osteoarthritis of right knee 03/27/2022   Greater trochanteric pain  syndrome of right lower extremity 03/27/2022   It band syndrome, right 03/27/2022   Hyperlipidemia associated with type 2 diabetes mellitus (HCC) 05/12/2021   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   Varicose veins of leg with pain, bilateral 07/06/2020   Acquired thrombophilia (HCC) 05/24/2020   Type II diabetes mellitus with complication (HCC) 01/19/2020   Gastric polyp    Colon polyps    Varicose veins of both lower extremities 10/20/2019   Dysphagia 10/20/2019   Osteoarthritis of right hand 10/31/2016   Nonischemic cardiomyopathy (HCC) 04/14/2016   BMI 33.0-33.9,adult 04/14/2016    PCP: Reubin Milan, MD  REFERRING PROVIDER: [name], MD  REFERRING DIAG: [id number] (ICD-10-CM) - Peroneal tendinitis, left   RATIONALE FOR EVALUATION AND TREATMENT: Rehabilitation  THERAPY DIAG: Left Knee Pain  ONSET DATE: Chronic Left Knee Pain   FOLLOW-UP APPT SCHEDULED WITH REFERRING PROVIDER: No    SUBJECTIVE:                                                                                                                                                                                         SUBJECTIVE STATEMENT:  Left knee pain   PERTINENT HISTORY:  Patient reports onset of progressing left knee pain within the last couple months. Patient denies trauma to the knee and has started using a compression sleeve for stability. She has had a history of left knee pain with improvement via physical therapy but rehab for the knee ended early due to pain at the ankle. She endorsed that imaging is significant for minimal space along the joint and she experiences an occasional "catch" or "pop". She denies swelling, bruising or radiating pain below the knee.    PAIN:   Pain Intensity: Present: 0/10, Best: 0/10, Worst: 3/10 Pain location: Left knee  Pain quality:  Catching   Radiating pain: Yes  Swelling: No  Popping, catching, locking: Yes  Numbness/Tingling: No Focal weakness  or buckling: Yes Aggravating factors: Stairway, Prolonged Walking, Prolonged  sitting  Relieving factors: Rest, Ice, Compression Sleeve, Massage  24-hour pain behavior: AM: "Wobbly" and "Stiff"  PM: Activity Dependent, more activity or walking aggravates the pain History of prior back, hip, or knee injury, pain, surgery, or therapy: Yes, improvement with PT for chronic knee pain Dominant hand: right Imaging: Yes, "significant for minimal spacing between tibia and femur" Prior level of function: Independent  Occupational demands: Works at [employer] part time; Hobbies: Gardening, Yard work, Reading, [employer] flags: Negative for personal history of cancer, chills/fever, night sweats, nausea, vomiting, unexplained weight gain/loss, unrelenting pain  PRECAUTIONS: None  WEIGHT BEARING RESTRICTIONS: No  FALLS: Has patient fallen in last 6 months? No  Living Environment Lives with: lives alone Lives in: House/apartment  Patient Goals: "I would like for my knee pain to reduce and gain more stability.    OBJECTIVE:   Patient Surveys  FOTO 47, predicted to 53  Cognition Patient is oriented to person, place, and time.  Recent memory is intact.  Remote memory is intact.  Attention span and concentration are intact.  Expressive speech is intact.  Patient's fund of knowledge is within normal limits for educational level.    Gross Musculoskeletal Assessment Tremor: None Bulk: Normal Tone: Normal  GAIT: Deferred   Posture: Deferred  AROM AROM (Normal range in degrees) AROM  Hip Right Left  Flexion (125)    Extension (15)    Abduction (40)    Adduction     Internal Rotation (45)    External Rotation (45)        Knee    Flexion (135)    Extension (0)        Ankle    Dorsiflexion (20)    Plantarflexion (50)    Inversion (35)    Eversion (15    (* = pain; Blank rows = not tested)  LE MMT: MMT (out of 5) Right Left  Hip flexion 4 4  Hip extension     Hip abduction    Hip adduction    Hip internal rotation 5 4  Hip external rotation 5 4+  Knee flexion 5 5  Knee extension 5 5  Ankle dorsiflexion 5 5  Ankle plantarflexion 5 5  Ankle inversion    Ankle eversion    (* = pain; Blank rows = not tested)  Sensation Grossly intact to light touch bilateral LEs as determined by testing dermatomes L2-S2. Proprioception, and hot/cold testing deferred on this date.  Reflexes (R/L) KJ: 2+/2+  Muscle Length Hamstrings: Deferred Quadriceps Michela Pitcher): Deferred Hip flexors Maisie Fus): Deferred IT band Claiborne Rigg): R: Deferred L: Negative  Palpation  Location LEFT  RIGHT           Quadriceps 1 (lateral aspect)    Medial Hamstrings 0   Lateral Hamstrings 0   Lateral Hamstring tendon 1   Medial Hamstring tendon 1   Quadriceps tendon 0   Patella 0   Patellar Tendon 0   Tibial Tuberosity    Medial joint line 1   Lateral joint line 1   MCL    LCL    Adductor Tubercle    Pes Anserine tendon 1   Infrapatellar fat pad 0   Fibular head 1   Popliteal fossa 0   IT band  2   (Blank rows = not tested) Graded on 0-4 scale (0 = no pain, 1 = pain, 2 = pain with wincing/grimacing/flinching, 3 = pain with withdrawal, 4 = unwilling to allow palpation), (Blank  rows = not tested)  Passive Accessory Motion Tibiofemoral: Deferred  Fibula on Femur: Deferred  Patellofemoral: Superior Glide: R: Negative L: Negative Inferior Glide: R: Negative L: Negative Medial Glide: R: Negative L: Negative Lateral Glide: R: Negative L: Negative  Medial Tilt: R: Negative L: Negative Lateral Tilt: R: Not done L: Positive for discomfort   SPECIAL TESTS  Ligamentous Stability  ACL: Lachman's: R: Negative L: Negative  PCL: Posterior Drawer: R: Negative L: Negative  MCL: Valgus Stress (30 degrees flexion): R: Negative L: Negative  LCL: Varus Stress (30 degrees flexion): R: Negative L: Negative  Meniscus Tests McMurray's Test: Negative Steinmann Sign I: R:  Negative L: Negative  Patellofemoral Pain Syndrome Patellar Tilt (Lateral): R: negative  L: Negative Squatting pain: Deferred Stair climbing pain: Deferred  Patellar Tendinopathy Deferred     TODAY'S TREATMENT:   Subjective: patient reported that she visited the chiropractor Thursday for IT band; reports minor soreness but overall feeling well. No further questions or concerns   Pain: 0/10 Resting pain   Therex:  Seated LAQ and Knee Flexion against manual resistance 2 x 10 ea direction BLE;  Supine Bridge 1 x 10;  Supine Bridge with hip abduction 1 x 10; Supine SLR 2 x 10 BLE;  6" Step up, No UE support 2 x 10 BLE; Total Gym, BLE Squat Level 22  2 x 10 BLE;  Partial Lunge at stairs 1 x 10 BLE;  Standing marches with 4# AW in // bars;  Marches over 6" hurdles and airex pad in // bars x 4 trials;  Educated on proper squat form  PATIENT EDUCATION:  Education details: Plan of Care and HEP Person educated: Patient Education method: Explanation, Facilities manager, and Handouts Education comprehension: verbalized understanding   HOME EXERCISE PROGRAM:  Access Code: [email or web address] URL: [email or web address] Date: 01/24/2023 Prepared by: Ria Comment  Exercises - Seated Hamstring Stretch  - 1 x daily - 7 x weekly - 3 sets - 30 hold - Sidelying Quadriceps Stretch  - 1 x daily - 7 x weekly - 3 sets - 10 reps - 30 hold - Supine Piriformis Stretch  - 1 x daily - 7 x weekly - 3 sets - 30 hold - Supine Bridge  - 1 x daily - 7 x weekly - 2-3 sets - 10 reps - 5 hold - Seated Hip Abduction with Resistance  - 1 x daily - 7 x weekly - 2-3 sets - 10 reps - 5 hold - Mini Squat with Counter Support  - 1 x daily - 7 x weekly - 2-3 sets - 10 reps - 5 hold   ASSESSMENT:  CLINICAL IMPRESSION: Patient reported to physical therapy highly motivated. Progressed LE strengthening in today's session. Patient with continued moderate weakness in RLE when compared to the LLE. Pt performed  single leg marches without major LOB and good range. PT plan to continue progressing LE strength and balance. Based on today's performance, the patient will benefit from continued skilled physical therapy focused on stretching and strengthening the LE in order to improve functional mobility and return to PLOF.   OBJECTIVE IMPAIRMENTS: decreased activity tolerance, decreased balance, difficulty walking, decreased strength, and pain.   ACTIVITY LIMITATIONS: bending, sitting, standing, squatting, and stairs  PARTICIPATION LIMITATIONS: shopping, community activity, and yard work  PERSONAL FACTORS: Age, Fitness, Past/current experiences, Time since onset of injury/illness/exacerbation, and 3+ comorbidities: OA, a-fib, OSA, and CHF  are also affecting patient's functional outcome.   REHAB POTENTIAL: Good  CLINICAL DECISION MAKING: Evolving/moderate  complexity  EVALUATION COMPLEXITY: Moderate   GOALS: Goals reviewed with patient? Yes  SHORT TERM GOALS: Target date: 02/15/2023  Pt will be independent with HEP to improve strength and decrease knee pain to improve pain-free function at home and work. Baseline:  Goal status: INITIAL    LONG TERM GOALS: Target date: 03/15/2023  Pt will increase FOTO to at least 58 to demonstrate significant improvement in function at home and work related to knee pain  Baseline: 01/18/23: 47 Goal status: INITIAL  2.  Pt will decrease worst knee pain by at least 3 points on the NPRS in order to demonstrate clinically significant reduction in knee pain. Baseline: 01/18/2023: 3/10 Goal status: INITIAL  3.  Pt will increase strength of hip flexion and hip abduction by at least 1/2 MMT grade in order to demonstrate improvement in strength and function  Baseline:  Goal status: INITIAL   PLAN: PT FREQUENCY: 1-2x/week  PT DURATION: 8 weeks  PLANNED INTERVENTIONS: Therapeutic exercises, Therapeutic activity, Neuromuscular re-education, Balance training, Gait  training, Patient/Family education, Self Care, Joint mobilization, Joint manipulation, Vestibular training, Canalith repositioning, Orthotic/Fit training, DME instructions, Dry Needling, Electrical stimulation, Spinal manipulation, Spinal mobilization, Cryotherapy, Moist heat, Taping, Traction, Ultrasound, Ionotophoresis 4mg /ml Dexamethasone, Manual therapy, and Re-evaluation.  PLAN FOR NEXT SESSION: Hip and Knee ROM, Hip Abduction and Adduction, Progress Hip and knee strengthening,     Sharalyn Ink Huprich PT, DPT, GCS  Jennea Rager, SPT

## 2023-02-06 ENCOUNTER — Ambulatory Visit: Payer: Medicare Other

## 2023-02-06 ENCOUNTER — Ambulatory Visit (INDEPENDENT_AMBULATORY_CARE_PROVIDER_SITE_OTHER): Payer: Medicare Other | Admitting: Internal Medicine

## 2023-02-06 ENCOUNTER — Encounter: Payer: Self-pay | Admitting: Internal Medicine

## 2023-02-06 ENCOUNTER — Ambulatory Visit: Payer: Medicare Other | Admitting: Internal Medicine

## 2023-02-06 VITALS — BP 128/77 | HR 67 | Ht 67.0 in | Wt 230.0 lb

## 2023-02-06 DIAGNOSIS — N3001 Acute cystitis with hematuria: Secondary | ICD-10-CM | POA: Diagnosis not present

## 2023-02-06 DIAGNOSIS — M6281 Muscle weakness (generalized): Secondary | ICD-10-CM

## 2023-02-06 DIAGNOSIS — G8929 Other chronic pain: Secondary | ICD-10-CM | POA: Diagnosis not present

## 2023-02-06 DIAGNOSIS — M25562 Pain in left knee: Secondary | ICD-10-CM | POA: Diagnosis not present

## 2023-02-06 LAB — POCT URINALYSIS DIPSTICK
Bilirubin, UA: NEGATIVE
Glucose, UA: NEGATIVE
Ketones, UA: NEGATIVE
Nitrite, UA: NEGATIVE
Protein, UA: NEGATIVE
Spec Grav, UA: 1.025 (ref 1.010–1.025)
Urobilinogen, UA: 0.2 E.U./dL
pH, UA: 5 (ref 5.0–8.0)

## 2023-02-06 MED ORDER — NITROFURANTOIN MONOHYD MACRO 100 MG PO CAPS
100.0000 mg | ORAL_CAPSULE | Freq: Two times a day (BID) | ORAL | 0 refills | Status: AC
Start: 2023-02-06 — End: 2023-02-13

## 2023-02-06 NOTE — Progress Notes (Signed)
Date:  02/06/2023   Name:  Brandy [name]   DOB:  10/17/1955   MRN:  [id number]   Chief Complaint: Urinary Tract Infection (Started Saturday. Having urgency, and retention. Burning while urinating on and off. Pressure is present.)  Urinary Tract Infection  This is a new problem. Episode onset: 3 days ago. The problem has been gradually worsening. The quality of the pain is described as burning. The pain is mild. Associated symptoms include frequency and urgency. Pertinent negatives include no chills, nausea or vomiting. She has tried increased fluids for the symptoms. The treatment provided no relief.    Lab Results  Component Value Date   NA 143 03/20/2022   K 4.5 03/20/2022   CO2 21 03/20/2022   GLUCOSE 96 03/20/2022   BUN 14 03/20/2022   CREATININE 0.7 10/24/2022   CALCIUM 9.4 03/20/2022   EGFR 90.001 10/24/2022   GFRNONAA >60 11/16/2020   Lab Results  Component Value Date   CHOL 166 03/20/2022   HDL 46 03/20/2022   LDLCALC 97 03/20/2022   LDLDIRECT 131.0 10/31/2016   TRIG 127 03/20/2022   CHOLHDL 3.6 03/20/2022   Lab Results  Component Value Date   TSH 1.460 08/01/2021   Lab Results  Component Value Date   HGBA1C 5.5 12/20/2022   Lab Results  Component Value Date   WBC 6.5 11/16/2020   HGB 14.6 11/16/2020   HCT 42.7 11/16/2020   MCV 92.4 11/16/2020   PLT 267 11/16/2020   Lab Results  Component Value Date   ALT 14 03/20/2022   AST 14 03/20/2022   ALKPHOS 94 03/20/2022   BILITOT 0.4 03/20/2022   Lab Results  Component Value Date   VD25OH 38.71 10/31/2016     Review of Systems  Constitutional:  Negative for chills, fatigue and fever.  Respiratory:  Negative for chest tightness and shortness of breath.   Cardiovascular:  Negative for chest pain.  Gastrointestinal:  Negative for diarrhea, nausea and vomiting.  Genitourinary:  Positive for dysuria, frequency and urgency.  Psychiatric/Behavioral:  Negative for dysphoric mood and sleep disturbance. The  patient is nervous/anxious.     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   Varicose veins of leg with pain, bilateral 07/06/2020   Acquired thrombophilia (HCC) 05/24/2020   Type II diabetes mellitus with complication (HCC) 01/19/2020   Gastric polyp    Colon polyps    Varicose veins of both lower extremities 10/20/2019   Dysphagia 10/20/2019   Osteoarthritis of right hand 10/31/2016   Nonischemic cardiomyopathy (HCC) 04/14/2016   BMI 33.0-33.9,adult 04/14/2016    No Known Allergies  Past Surgical History:  Procedure Laterality Date   ATRIAL FIBRILLATION ABLATION N/A 12/09/2020   Procedure: ATRIAL FIBRILLATION ABLATION;  Surgeon: Lanier Prude, MD;  [facility]: [facility];  Service: Cardiovascular;  Laterality: N/A;   BIOPSY N/A 01/12/2020   Procedure: BIOPSY;  Surgeon: Pasty Spillers, MD;  Location: [facility];  Service: Endoscopy;  Laterality: N/A;   CARDIAC CATHETERIZATION N/A 04/17/2016   Procedure: Left Heart Cath  and Coronary Angiography;  Surgeon: Iran Ouch, MD;  Location: [facility];  Service: Cardiovascular;  Laterality: N/A;   CARDIOVERSION N/A 10/22/2020   Procedure: CARDIOVERSION;  Surgeon: Yvonne Kendall, MD;  Location: [facility];  Service: Cardiovascular;  Laterality: N/A;   CHOLECYSTECTOMY     COLON SURGERY  2021   COLONOSCOPY N/A 01/20/2020   Procedure: COLONOSCOPY;  Surgeon: Regis Bill, MD;  Location: [facility] END[facility];  Service: Endoscopy;  Laterality: N/A;    COLONOSCOPY WITH PROPOFOL N/A 01/12/2020   Procedure: COLONOSCOPY WITH PROPOFOL;  Surgeon: Pasty Spillers, MD;  Location: [facility];  Service: Endoscopy;  Laterality: N/A;  Diabetic - oral meds sleep apnea   ELECTROPHYSIOLOGIC STUDY N/A 05/26/2016   Procedure: Cardioversion;  Surgeon: Iran Ouch, MD;  Location: [facility];  Service: Cardiovascular;  Laterality: N/A;   ESOPHAGOGASTRODUODENOSCOPY (EGD) WITH PROPOFOL N/A 01/12/2020   Procedure: ESOPHAGOGASTRODUODENOSCOPY (EGD) WITH PROPOFOL;  Surgeon: Pasty Spillers, MD;  Location: [facility];  Service: Endoscopy;  Laterality: N/A;   POLYPECTOMY N/A 01/12/2020   Procedure: POLYPECTOMY;  Surgeon: Pasty Spillers, MD;  Location: [facility];  Service: Endoscopy;  Laterality: N/A;    Social History   Tobacco Use   Smoking status: Never   Smokeless tobacco: Never  Vaping Use   Vaping status: Never Used  Substance Use Topics   Alcohol use: Never   Drug use: Never     Medication list has been reviewed and updated.  Current Meds  Medication Sig   CALCIUM PO Take by mouth daily.   Celery Seed OIL Using as needed to help with constipation   Cholecalciferol (VITAMIN D3) 1000 units CAPS Take 1,000 Units by mouth 2 (two) times daily.   diclofenac (VOLTAREN) 50 MG EC tablet Take 1 tablet by mouth twice daily as needed   furosemide (LASIX) 20 MG tablet Take 1 tablet (20 mg total) by mouth daily as needed for fluid or edema.   gabapentin (NEURONTIN) 100 MG capsule TAKE 1 TO 3 CAPSULES BY MOUTH AT BEDTIME   glucose blood (ONETOUCH VERIO) test strip USE 1 STRIP TO CHECK GLUCOSE UP TO 4 TIMES DAILY AS DIRECTED   Lancets (ONETOUCH DELICA PLUS LANCET33G) MISC USE  1 TO CHECK GLUCOSE UP TO 4 TIMES DAILY AS DIRECTED   Lavender Oil OIL Apply 1 application topically daily as needed (stress).   Lemon Oil OIL Take 1 application by mouth daily as needed (add to water).   Lemongrass Oil OIL Apply 1 application  topically daily as needed (multiple uses).   Magnesium Bisglycinate (MAG GLYCINATE) 100 MG TABS Take 270 mg by mouth in the morning, at noon, and at bedtime.   metFORMIN (GLUCOPHAGE-XR) 500 MG 24 hr tablet Take 1/2 (one-half) tablet by mouth twice daily   metoprolol tartrate (LOPRESSOR) 50 MG tablet Take 1/2 (one-half) tablet by mouth twice daily   Multiple Vitamins-Minerals (MULTIVITAMIN WITH MINERALS) tablet Take 1 tablet by mouth daily.   nitrofurantoin, macrocrystal-monohydrate, (MACROBID) 100 MG capsule Take 1 capsule (100 mg total) by mouth 2 (two) times daily for 7 days.   Oil Base OIL Breathe oil as needed for congestion   OIL OF OREGANO PO Uses as needed   peppermint oil liquid Apply 1 application topically daily as needed (itchy/ cold symptoms).   Tea Tree Oil OIL Apply 1 application topically daily as needed (fungus).   vitamin B-12 (CYANOCOBALAMIN) 500 MCG tablet Take 500 mcg by mouth daily.   vitamin C (ASCORBIC  ACID) 500 MG tablet Take 500 mg by mouth daily.    vitamin E 45 MG (100 UNITS) capsule Take by mouth daily.       12/20/2022    7:58 AM 09/06/2022   10:35 AM 03/20/2022    9:35 AM 03/10/2022   11:15 AM  GAD 7 : Generalized Anxiety Score  Nervous, Anxious, on Edge 1 0 1 0  Control/stop worrying 1 0 0 0  Worry too much - different things 1 0 0 0  Trouble relaxing 1 2 0 0  Restless 1 1 0 0  Easily annoyed or irritable 1 0 0 0  Afraid - awful might happen 1 0 0 0  Total GAD 7 Score 7 3 1  0  Anxiety Difficulty Somewhat difficult Not difficult at all Not difficult at all Not difficult at all       12/20/2022    7:58 AM 12/06/2022    3:39 PM 09/06/2022   10:35 AM  Depression screen PHQ 2/9  Decreased Interest 1 1 2   Down, Depressed, Hopeless 1 1 0  PHQ - 2 Score 2 2 2   Altered sleeping 2 1 2   Tired, decreased energy 1 1 2   Change in appetite 2  1  Feeling bad or failure about yourself  0  0  Trouble concentrating 1  0  Moving slowly or fidgety/restless 0  1   Suicidal thoughts 0  0  PHQ-9 Score 8 4 8   Difficult doing work/chores Not difficult at all Not difficult at all Not difficult at all    BP Readings from Last 3 Encounters:  02/06/23 128/77  12/20/22 120/76  10/13/22 128/70    Physical Exam Vitals and nursing note reviewed.  Constitutional:      Appearance: She is well-developed.  Cardiovascular:     Rate and Rhythm: Normal rate and regular rhythm.     Heart sounds: Normal heart sounds.  Pulmonary:     Effort: Pulmonary effort is normal. No respiratory distress.     Breath sounds: Normal breath sounds.  Abdominal:     General: Bowel sounds are normal.     Palpations: Abdomen is soft.     Tenderness: There is abdominal tenderness in the suprapubic area. There is no right CVA tenderness, left CVA tenderness, guarding or rebound.    Lab Results  Component Value Date   COLORU yellow 02/06/2023   CLARITYU cloudy 02/06/2023   GLUCOSEUR Negative 02/06/2023   BILIRUBINUR neg 02/06/2023   KETONESU neg 02/06/2023   SPECGRAV 1.025 02/06/2023   RBCUR large 3+ 02/06/2023   PHUR 5.0 02/06/2023   PROTEINUR Negative 02/06/2023   UROBILINOGEN 0.2 02/06/2023   LEUKOCYTESUR Small (1+) (A) 02/06/2023     Wt Readings from Last 3 Encounters:  02/06/23 230 lb (104.3 kg)  12/20/22 233 lb (105.7 kg)  12/06/22 232 lb (105.2 kg)    BP 128/77 (BP Location: Left Arm, Cuff Size: Large)   Pulse 67   Ht 5\' 7"  (1.702 m)   Wt 230 lb (104.3 kg)   SpO2 98%   BMI 36.02 kg/m   Assessment and Plan:  Problem List Items Addressed This Visit   None Visit Diagnoses     Acute cystitis with hematuria    -  Primary   push fluids will get UCx and advise if antibiotics are appropriate   Relevant Medications   nitrofurantoin, macrocrystal-monohydrate, (MACROBID) 100 MG capsule   Other Relevant Orders   POCT Urinalysis Dipstick (Completed)   Urine  Culture       No follow-ups on file.    Reubin Milan, MD [facility] [place]

## 2023-02-06 NOTE — Therapy (Addendum)
OUTPATIENT PHYSICAL THERAPY KNEE TREATMENT  Patient Name: Brandy [name] MRN: [id number] DOB:09/22/1955, 67 y.o., female Today's Date: 02/06/2023  END OF SESSION:  PT End of Session - 02/06/23 0848     Visit Number 4    Number of Visits 17    Date for PT Re-Evaluation 03/15/23    Authorization Type eval: 09/05//24, BC[employer] Medicare 2024  YQ:[id number] on [place]  $10 copay  no auth req    PT Start Time [phone]    PT Stop Time 0930    PT Time Calculation (min) 44 min    Activity Tolerance Patient tolerated treatment well    Behavior During Therapy [facility] for tasks assessed/performed            Past Medical History:  Diagnosis Date   Acute lower GI bleeding 01/19/2020   Arthritis    Atrial fibrillation with rapid ventricular response (HCC)    Chicken pox    Chronic systolic CHF (congestive heart failure) (HCC)    a. 03/2016 Echo: Ef 15-20%, diff HK, ant AK;  b. 05/2016 Echo: Ef 30-35%, diff HK, mildly dil LA/RA.   Diabetes mellitus without complication (HCC)    Dysrhythmia    GERD (gastroesophageal reflux disease)    Heart murmur    Hip discomfort    been going on for 40 years    History of kidney stones    Hyperlipidemia associated with type 2 diabetes mellitus (HCC) 05/12/2021   Hypertension    Moderate mitral regurgitation    a. 03/2016 Echo: mod MR in setting of LV dysfxn.   Motion sickness    car - back seat   NICM (nonischemic cardiomyopathy) (HCC)    a. 03/2016 Echo: EF 15-20%, diff HK, ant AK, mod MR, mod dil LA, mildly dil RA;  b. 04/2016 Cath: nl cors;  c. 05/2016 Echo: EF 30-35%, diff HK.   Obesity    Obstructive sleep apnea    compliant with CPAP   Persistent atrial fibrillation (HCC)    a. Dx 03/2016;  b. CHA2DS2VASc = 2-->Eliquis 5mg  BID;  b. 05/2016 Failed DCCV x 4.   Sleep apnea    Stomach irritation    Visit for monitoring Tikosyn therapy 07/24/2016   Past Surgical History:  Procedure Laterality Date   ATRIAL FIBRILLATION ABLATION N/A 12/09/2020   Procedure: ATRIAL  FIBRILLATION ABLATION;  Surgeon: Lanier Prude, MD;  Location: [facility];  Service: Cardiovascular;  Laterality: N/A;   BIOPSY N/A 01/12/2020   Procedure: BIOPSY;  Surgeon: Pasty Spillers, MD;  Location: [facility];  Service: Endoscopy;  Laterality: N/A;   CARDIAC CATHETERIZATION N/A 04/17/2016   Procedure: Left Heart Cath and Coronary Angiography;  Surgeon: Iran Ouch, MD;  Location: [facility] INVASIVE CV LAB;  Service: Cardiovascular;  Laterality: N/A;   CARDIOVERSION N/A 10/22/2020   Procedure: CARDIOVERSION;  Surgeon: Yvonne Kendall, MD;  Location: [facility] ORS;  Service: Cardiovascular;  Laterality: N/A;   CHOLECYSTECTOMY     COLON SURGERY  2021   COLONOSCOPY N/A 01/20/2020   Procedure: COLONOSCOPY;  Surgeon: Regis Bill, MD;  Location: [facility] ENDOSCOPY;  Service: Endoscopy;  Laterality: N/A;   COLONOSCOPY WITH PROPOFOL N/A 01/12/2020   Procedure: COLONOSCOPY WITH PROPOFOL;  Surgeon: Pasty Spillers, MD;  Location: [facility];  Service: Endoscopy;  Laterality: N/A;  Diabetic - oral meds sleep apnea   ELECTROPHYSIOLOGIC STUDY N/A 05/26/2016   Procedure: Cardioversion;  Surgeon: Iran Ouch, MD;  Location: [facility] ORS;  Service: Cardiovascular;  Laterality: N/A;   ESOPHAGOGASTRODUODENOSCOPY (EGD) WITH PROPOFOL N/A 01/12/2020   Procedure: ESOPHAGOGASTRODUODENOSCOPY (EGD) WITH PROPOFOL;  Surgeon: Pasty Spillers, MD;  Location: [facility];  Service: Endoscopy;  Laterality: N/A;   POLYPECTOMY N/A 01/12/2020   Procedure: POLYPECTOMY;  Surgeon: Pasty Spillers, MD;  Location: [facility];  Service: Endoscopy;  Laterality: N/A;   Patient Active Problem List   Diagnosis Date Noted   Peroneal tendinitis, left 10/13/2022   Left lumbar radiculopathy 07/26/2022   Arthralgia of left knee 07/26/2022   Pes anserinus bursitis of right knee 04/19/2022   Primary osteoarthritis of right knee 03/27/2022   Greater trochanteric pain  syndrome of right lower extremity 03/27/2022   It band syndrome, right 03/27/2022   Hyperlipidemia associated with type 2 diabetes mellitus (HCC) 05/12/2021   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   Varicose veins of leg with pain, bilateral 07/06/2020   Acquired thrombophilia (HCC) 05/24/2020   Type II diabetes mellitus with complication (HCC) 01/19/2020   Gastric polyp    Colon polyps    Varicose veins of both lower extremities 10/20/2019   Dysphagia 10/20/2019   Osteoarthritis of right hand 10/31/2016   Nonischemic cardiomyopathy (HCC) 04/14/2016   BMI 33.0-33.9,adult 04/14/2016    PCP: Reubin Milan, MD  REFERRING PROVIDER: [name], MD  REFERRING DIAG: [phone] (ICD-10-CM) - Peroneal tendinitis, left   RATIONALE FOR EVALUATION AND TREATMENT: Rehabilitation  THERAPY DIAG: Left Knee Pain  ONSET DATE: Chronic Left Knee Pain   FOLLOW-UP APPT SCHEDULED WITH REFERRING PROVIDER: No    SUBJECTIVE:                                                                                                                                                                                         SUBJECTIVE STATEMENT:  Left knee pain   PERTINENT HISTORY:  Patient reports onset of progressing left knee pain within the last couple months. Patient denies trauma to the knee and has started using a compression sleeve for stability. She has had a history of left knee pain with improvement via physical therapy but rehab for the knee ended early due to pain at the ankle. She endorsed that imaging is significant for minimal space along the joint and she experiences an occasional "catch" or "pop". She denies swelling, bruising or radiating pain below the knee.    PAIN:   Pain Intensity: Present: 0/10, Best: 0/10, Worst: 3/10 Pain location: Left knee  Pain quality:  Catching   Radiating pain: Yes  Swelling: No  Popping, catching, locking: Yes  Numbness/Tingling: No Focal weakness  or buckling: Yes Aggravating factors: Stairway, Prolonged Walking, Prolonged  sitting  Relieving factors: Rest, Ice, Compression Sleeve, Massage  24-hour pain behavior: AM: "Wobbly" and "Stiff"  PM: Activity Dependent, more activity or walking aggravates the pain History of prior back, hip, or knee injury, pain, surgery, or therapy: Yes, improvement with PT for chronic knee pain Dominant hand: right Imaging: Yes, "significant for minimal spacing between tibia and femur" Prior level of function: Independent  Occupational demands: Works at [employer] part time; Hobbies: Gardening, Yard work, Reading, [employer] flags: Negative for personal history of cancer, chills/fever, night sweats, nausea, vomiting, unexplained weight gain/loss, unrelenting pain  PRECAUTIONS: None  WEIGHT BEARING RESTRICTIONS: No  FALLS: Has patient fallen in last 6 months? No  Living Environment Lives with: lives alone Lives in: House/apartment  Patient Goals: "I would like for my knee pain to reduce and gain more stability.    OBJECTIVE:   Patient Surveys  FOTO 47, predicted to 67  Cognition Patient is oriented to person, place, and time.  Recent memory is intact.  Remote memory is intact.  Attention span and concentration are intact.  Expressive speech is intact.  Patient's fund of knowledge is within normal limits for educational level.    Gross Musculoskeletal Assessment Tremor: None Bulk: Normal Tone: Normal  GAIT: Deferred   Posture: Deferred  AROM AROM (Normal range in degrees) AROM  Hip Right Left  Flexion (125)    Extension (15)    Abduction (40)    Adduction     Internal Rotation (45)    External Rotation (45)        Knee    Flexion (135)    Extension (0)        Ankle    Dorsiflexion (20)    Plantarflexion (50)    Inversion (35)    Eversion (15    (* = pain; Blank rows = not tested)  LE MMT: MMT (out of 5) Right Left  Hip flexion 4 4  Hip extension     Hip abduction    Hip adduction    Hip internal rotation 5 4  Hip external rotation 5 4+  Knee flexion 5 5  Knee extension 5 5  Ankle dorsiflexion 5 5  Ankle plantarflexion 5 5  Ankle inversion    Ankle eversion    (* = pain; Blank rows = not tested)  Sensation Grossly intact to light touch bilateral LEs as determined by testing dermatomes L2-S2. Proprioception, and hot/cold testing deferred on this date.  Reflexes (R/L) KJ: 2+/2+  Muscle Length Hamstrings: Deferred Quadriceps Michela Pitcher): Deferred Hip flexors Maisie Fus): Deferred IT band Claiborne Rigg): R: Deferred L: Negative  Palpation  Location LEFT  RIGHT           Quadriceps 1 (lateral aspect)    Medial Hamstrings 0   Lateral Hamstrings 0   Lateral Hamstring tendon 1   Medial Hamstring tendon 1   Quadriceps tendon 0   Patella 0   Patellar Tendon 0   Tibial Tuberosity    Medial joint line 1   Lateral joint line 1   MCL    LCL    Adductor Tubercle    Pes Anserine tendon 1   Infrapatellar fat pad 0   Fibular head 1   Popliteal fossa 0   IT band  2   (Blank rows = not tested) Graded on 0-4 scale (0 = no pain, 1 = pain, 2 = pain with wincing/grimacing/flinching, 3 = pain with withdrawal, 4 = unwilling to allow palpation), (Blank  rows = not tested)  Passive Accessory Motion Tibiofemoral: Deferred  Fibula on Femur: Deferred  Patellofemoral: Superior Glide: R: Negative L: Negative Inferior Glide: R: Negative L: Negative Medial Glide: R: Negative L: Negative Lateral Glide: R: Negative L: Negative  Medial Tilt: R: Negative L: Negative Lateral Tilt: R: Not done L: Positive for discomfort   SPECIAL TESTS  Ligamentous Stability  ACL: Lachman's: R: Negative L: Negative  PCL: Posterior Drawer: R: Negative L: Negative  MCL: Valgus Stress (30 degrees flexion): R: Negative L: Negative  LCL: Varus Stress (30 degrees flexion): R: Negative L: Negative  Meniscus Tests McMurray's Test: Negative Steinmann Sign I: R:  Negative L: Negative  Patellofemoral Pain Syndrome Patellar Tilt (Lateral): R: negative  L: Negative Squatting pain: Deferred Stair climbing pain: Deferred  Patellar Tendinopathy Deferred     TODAY'S TREATMENT:  Subjective: Patient reported feeling fatigued today after excessive amount of walking throughout the weekend. No further questions or concerns.   Pain: 0/10 Resting Pain  Therex:  Seated LAQ with 4# Ankle Weight 2 x 10;  Knee Flexion against manual resistance  Seated Hip Abduction 2 x 10 x 3;  Standing Marches with 4# AW in // bars;  Sit to Stand with 6# medicine ball;  6" Retro Step Down with BUE support, focused on eccentric control 2 x 10 BLE;  Partial Squats focused on "[employer]" and Hinge motion 2 x 5;    PATIENT EDUCATION:  Education details: Plan of Care and HEP Person educated: Patient Education method: Explanation, Demonstration, and Handouts Education comprehension: verbalized understanding   HOME EXERCISE PROGRAM:  Access Code: [email or web address] URL: [email or web address] Date: 01/24/2023 Prepared by: Ria Comment  Exercises - Seated Hamstring Stretch  - 1 x daily - 7 x weekly - 3 sets - 30 hold - Sidelying Quadriceps Stretch  - 1 x daily - 7 x weekly - 3 sets - 10 reps - 30 hold - Supine Piriformis Stretch  - 1 x daily - 7 x weekly - 3 sets - 30 hold - Supine Bridge  - 1 x daily - 7 x weekly - 2-3 sets - 10 reps - 5 hold - Seated Hip Abduction with Resistance  - 1 x daily - 7 x weekly - 2-3 sets - 10 reps - 5 hold - Mini Squat with Counter Support  - 1 x daily - 7 x weekly - 2-3 sets - 10 reps - 5 hold   ASSESSMENT:  CLINICAL IMPRESSION: Patient reported to physical therapy highly motivated. Today's session with a continued focus on LE strengthening. Patient endorsed improved pain with decreased radiating symptoms in posterior leg. She continues to demonstrate improvements in LE strength; she tolerated posterior step downs with minimal  UE support and no report pain.  PT plan to continue progressing LE strength and balance. Based on today's performance, the patient will benefit from continued skilled physical therapy focused on stretching and strengthening the LE in order to improve functional mobility and return to PLOF.   OBJECTIVE IMPAIRMENTS: decreased activity tolerance, decreased balance, difficulty walking, decreased strength, and pain.   ACTIVITY LIMITATIONS: bending, sitting, standing, squatting, and stairs  PARTICIPATION LIMITATIONS: shopping, community activity, and yard work  PERSONAL FACTORS: Age, Fitness, Past/current experiences, Time since onset of injury/illness/exacerbation, and 3+ comorbidities: OA, a-fib, OSA, and CHF  are also affecting patient's functional outcome.   REHAB POTENTIAL: Good  CLINICAL DECISION MAKING: Evolving/moderate complexity  EVALUATION COMPLEXITY: Moderate   GOALS: Goals reviewed with patient? Yes  SHORT TERM GOALS:  Target date: 02/15/2023  Pt will be independent with HEP to improve strength and decrease knee pain to improve pain-free function at home and work. Baseline:  Goal status: INITIAL    LONG TERM GOALS: Target date: 03/15/2023  Pt will increase FOTO to at least 58 to demonstrate significant improvement in function at home and work related to knee pain  Baseline: 01/18/23: 47 Goal status: INITIAL  2.  Pt will decrease worst knee pain by at least 3 points on the NPRS in order to demonstrate clinically significant reduction in knee pain. Baseline: 01/18/2023: 3/10 Goal status: INITIAL  3.  Pt will increase strength of hip flexion and hip abduction by at least 1/2 MMT grade in order to demonstrate improvement in strength and function  Baseline:  Goal status: INITIAL   PLAN: PT FREQUENCY: 1-2x/week  PT DURATION: 8 weeks  PLANNED INTERVENTIONS: Therapeutic exercises, Therapeutic activity, Neuromuscular re-education, Balance training, Gait training,  Patient/Family education, Self Care, Joint mobilization, Joint manipulation, Vestibular training, Canalith repositioning, Orthotic/Fit training, DME instructions, Dry Needling, Electrical stimulation, Spinal manipulation, Spinal mobilization, Cryotherapy, Moist heat, Taping, Traction, Ultrasound, Ionotophoresis 4mg /ml Dexamethasone, Manual therapy, and Re-evaluation.  PLAN FOR NEXT SESSION: Hip and Knee ROM, Hip Abduction and Adduction, Progress Hip and knee strengthening,     Brandy [name] PT, DPT, GCS  Brandy [name], SPT

## 2023-02-07 ENCOUNTER — Other Ambulatory Visit: Payer: Self-pay

## 2023-02-07 ENCOUNTER — Emergency Department: Payer: Medicare Other

## 2023-02-07 ENCOUNTER — Encounter: Payer: Self-pay | Admitting: *Deleted

## 2023-02-07 ENCOUNTER — Emergency Department
Admission: EM | Admit: 2023-02-07 | Discharge: 2023-02-07 | Disposition: A | Discharge status: Home / Self Care | Payer: Medicare Other | Attending: Emergency Medicine | Admitting: Emergency Medicine

## 2023-02-07 DIAGNOSIS — Z9049 Acquired absence of other specified parts of digestive tract: Secondary | ICD-10-CM | POA: Diagnosis not present

## 2023-02-07 DIAGNOSIS — N200 Calculus of kidney: Secondary | ICD-10-CM

## 2023-02-07 DIAGNOSIS — E119 Type 2 diabetes mellitus without complications: Secondary | ICD-10-CM | POA: Diagnosis not present

## 2023-02-07 DIAGNOSIS — N134 Hydroureter: Secondary | ICD-10-CM | POA: Diagnosis not present

## 2023-02-07 DIAGNOSIS — I509 Heart failure, unspecified: Secondary | ICD-10-CM | POA: Insufficient documentation

## 2023-02-07 DIAGNOSIS — N132 Hydronephrosis with renal and ureteral calculous obstruction: Secondary | ICD-10-CM | POA: Diagnosis not present

## 2023-02-07 DIAGNOSIS — R109 Unspecified abdominal pain: Secondary | ICD-10-CM | POA: Diagnosis not present

## 2023-02-07 DIAGNOSIS — K5730 Diverticulosis of large intestine without perforation or abscess without bleeding: Secondary | ICD-10-CM | POA: Diagnosis not present

## 2023-02-07 LAB — BASIC METABOLIC PANEL
Anion gap: 11 (ref 5–15)
BUN: 14 mg/dL (ref 8–23)
CO2: 17 mmol/L — ABNORMAL LOW (ref 22–32)
Calcium: 9 mg/dL (ref 8.9–10.3)
Chloride: 107 mmol/L (ref 98–111)
Creatinine, Ser: 0.93 mg/dL (ref 0.44–1.00)
GFR, Estimated: >60 mL/min (ref >60)
Glucose, Bld: 133 mg/dL — ABNORMAL HIGH (ref 70–99)
Potassium: 3.7 mmol/L (ref 3.5–5.1)
Sodium: 135 mmol/L (ref 135–145)

## 2023-02-07 LAB — CBC
HCT: 45.5 % (ref 36.0–46.0)
Hemoglobin: 15.1 g/dL — ABNORMAL HIGH (ref 12.0–15.0)
MCH: 30.5 pg (ref 26.0–34.0)
MCHC: 33.2 g/dL (ref 30.0–36.0)
MCV: 91.9 fL (ref 80.0–100.0)
Platelets: 273 10*3/uL (ref 150–400)
RBC: 4.95 MIL/uL (ref 3.87–5.11)
RDW: 12.3 % (ref 11.5–15.5)
WBC: 6.7 10*3/uL (ref 4.0–10.5)
nRBC: 0 % (ref 0.0–0.2)

## 2023-02-07 MED ORDER — KETOROLAC TROMETHAMINE 15 MG/ML IJ SOLN
15.0000 mg | Freq: Once | INTRAMUSCULAR | Status: AC
  Administered 2023-02-07: 15 mg via INTRAVENOUS
  Filled 2023-02-07: qty 1

## 2023-02-07 MED ORDER — ONDANSETRON HCL 4 MG/2ML IJ SOLN
4.0000 mg | Freq: Once | INTRAMUSCULAR | Status: AC
  Administered 2023-02-07: 4 mg via INTRAVENOUS
  Filled 2023-02-07: qty 2

## 2023-02-07 MED ORDER — ONDANSETRON 4 MG PO TBDP: 4.0000 mg | ORAL_TABLET | Freq: Three times a day (TID) | ORAL | 0 refills | Status: DC | PRN

## 2023-02-07 MED ORDER — TAMSULOSIN HCL 0.4 MG PO CAPS: 0.4000 mg | ORAL_CAPSULE | Freq: Every day | ORAL | 0 refills | Status: DC

## 2023-02-07 MED ORDER — OXYCODONE-ACETAMINOPHEN 5-325 MG PO TABS
1.0000 | ORAL_TABLET | Freq: Three times a day (TID) | ORAL | 0 refills | Status: AC | PRN
Start: 2023-02-07 — End: 2024-02-07

## 2023-02-07 NOTE — ED Notes (Signed)
See triage notes. Patient stated she began having signs of a UTI on Saturday. Patient reported using some essential oils that seemed to help "clear it up some" over the weekend and then saw her PCP yesterday. Patient stated she woke up this morning and was unable to produce but a few drops of urine. Patient has a hx of kidney stones.

## 2023-02-07 NOTE — ED Notes (Signed)
Back from CT  states nausea is worse and pain is increasing

## 2023-02-07 NOTE — ED Provider Notes (Signed)
[facility] Provider Note    Event Date/Time   First MD Initiated Contact with Patient 02/07/23 0732     (approximate)   History   Flank Pain   HPI  Brandy [name] is a 67 y.o. female with a history of kidney stones, CHF, diabetes who presents with complaints of left-sided back and some flank pain.  She also reports difficulty urinating.  This started yesterday.  No fevers or chills.     Physical Exam   Triage Vital Signs: ED Triage Vitals  Encounter Vitals Group     BP 02/07/23 0728 (!) 151/67     Systolic BP Percentile --      Diastolic BP Percentile --      Pulse Rate 02/07/23 0728 65     Resp 02/07/23 0728 18     Temp 02/07/23 0728 97.6 F (36.4 C)     Temp Source 02/07/23 0728 Oral     SpO2 02/07/23 0728 100 %     Weight 02/07/23 0751 104 kg (229 lb 4.5 oz)     Height 02/07/23 0751 1.702 m (5\' 7" )     Head Circumference --      Peak Flow --      Pain Score 02/07/23 0729 5     Pain Loc --      Pain Education --      Exclude from Growth Chart --     Most recent vital signs: Vitals:   02/07/23 0728  BP: (!) 151/67  Pulse: 65  Resp: 18  Temp: 97.6 F (36.4 C)  SpO2: 100%     General: Awake, no distress.  CV:  Good peripheral perfusion.  Resp:  Normal effort.  Abd:  No distention.  Mild CVA tenderness in the left Other:     ED Results / Procedures / Treatments   Labs (all labs ordered are listed, but only abnormal results are displayed) Labs Reviewed  BASIC METABOLIC PANEL - Abnormal; Notable for the following components:      Result Value   CO2 17 (*)    Glucose, Bld 133 (*)    All other components within normal limits  CBC - Abnormal; Notable for the following components:   Hemoglobin 15.1 (*)    All other components within normal limits  URINALYSIS, ROUTINE W REFLEX MICROSCOPIC     EKG     RADIOLOGY CT viewed interpret by me, left-sided ureterolithiasis    PROCEDURES:  Critical Care performed:    Procedures   MEDICATIONS ORDERED IN ED: Medications  ondansetron (ZOFRAN) injection 4 mg (4 mg Intravenous Given 02/07/23 0825)  ketorolac (TORADOL) 15 MG/ML injection 15 mg (15 mg Intravenous Given 02/07/23 0900)     IMPRESSION / MDM / ASSESSMENT AND PLAN / ED COURSE  I reviewed the triage vital signs and the nursing notes. Patient's presentation is most consistent with acute presentation with potential threat to life or bodily function.  Patient presents with flank pain, back pain, urinary retention as detailed above.  Differential includes UTI, pyelo-, kidney stone, musculoskeletal pain  Will obtain bladder scan, urinalysis, place IV, check labs and obtain CT renal stone study  CT scan demonstrates 6 mm left UVJ stone.  Patient had urinalysis yesterday which was reassuring, she is on Macrobid.  No white blood cell count, no fever, no tachycardia.  No concerns for infection at this time.  Pain is controlled after IV Toradol, appropriate for discharge at this time with outpatient follow-up, strict  return precautions of any fever or infectious etiology.      FINAL CLINICAL IMPRESSION(S) / ED DIAGNOSES   Final diagnoses:  Kidney stone     Rx / DC Orders   ED Discharge Orders          Ordered    oxyCODONE-acetaminophen (PERCOCET) 5-325 MG tablet  Every 8 hours PRN        02/07/23 0915    tamsulosin (FLOMAX) 0.4 MG CAPS capsule  Daily        02/07/23 0915    ondansetron (ZOFRAN-ODT) 4 MG disintegrating tablet  Every 8 hours PRN        02/07/23 0915             Note:  This document was prepared using Dragon voice recognition software and may include unintentional dictation errors.   Jene Every, MD 02/07/23 [phone]

## 2023-02-07 NOTE — ED Triage Notes (Signed)
Pt is here for left flank pain which feels like a kidney stone.  Pt reports that she woke up with this pain this am.  Pt reports urinary symptoms since Saturday difficulty urinating and urgency

## 2023-02-07 NOTE — ED Notes (Signed)
See triage note  Presents with some urinary sx's   States she was recently placed on meds for UTI  but pain started this am

## 2023-02-08 LAB — URINE CULTURE

## 2023-02-12 ENCOUNTER — Telehealth: Payer: Self-pay | Admitting: *Deleted

## 2023-02-12 ENCOUNTER — Emergency Department: Payer: Medicare Other

## 2023-02-12 ENCOUNTER — Other Ambulatory Visit: Payer: Self-pay

## 2023-02-12 ENCOUNTER — Emergency Department
Admission: EM | Admit: 2023-02-12 | Discharge: 2023-02-12 | Disposition: A | Discharge status: Home / Self Care | Payer: Medicare Other | Attending: Emergency Medicine | Admitting: Emergency Medicine

## 2023-02-12 ENCOUNTER — Encounter: Payer: Self-pay | Admitting: Emergency Medicine

## 2023-02-12 ENCOUNTER — Ambulatory Visit: Payer: Self-pay

## 2023-02-12 DIAGNOSIS — K449 Diaphragmatic hernia without obstruction or gangrene: Secondary | ICD-10-CM | POA: Diagnosis not present

## 2023-02-12 DIAGNOSIS — K5730 Diverticulosis of large intestine without perforation or abscess without bleeding: Secondary | ICD-10-CM | POA: Diagnosis not present

## 2023-02-12 DIAGNOSIS — I509 Heart failure, unspecified: Secondary | ICD-10-CM | POA: Insufficient documentation

## 2023-02-12 DIAGNOSIS — N132 Hydronephrosis with renal and ureteral calculous obstruction: Secondary | ICD-10-CM | POA: Insufficient documentation

## 2023-02-12 DIAGNOSIS — R109 Unspecified abdominal pain: Secondary | ICD-10-CM | POA: Diagnosis not present

## 2023-02-12 DIAGNOSIS — E119 Type 2 diabetes mellitus without complications: Secondary | ICD-10-CM | POA: Diagnosis not present

## 2023-02-12 DIAGNOSIS — N201 Calculus of ureter: Secondary | ICD-10-CM

## 2023-02-12 LAB — URINALYSIS, ROUTINE W REFLEX MICROSCOPIC
Bilirubin Urine: NEGATIVE
Glucose, UA: NEGATIVE mg/dL
Ketones, ur: 20 mg/dL — AB
Leukocytes,Ua: NEGATIVE
Nitrite: NEGATIVE
Protein, ur: NEGATIVE mg/dL
RBC / HPF: >50 RBC/hpf (ref 0–5)
Specific Gravity, Urine: 1.023 (ref 1.005–1.030)
pH: 5 (ref 5.0–8.0)

## 2023-02-12 LAB — CBC
HCT: 40.8 % (ref 36.0–46.0)
Hemoglobin: 14.2 g/dL (ref 12.0–15.0)
MCH: 30.6 pg (ref 26.0–34.0)
MCHC: 34.8 g/dL (ref 30.0–36.0)
MCV: 87.9 fL (ref 80.0–100.0)
Platelets: 272 10*3/uL (ref 150–400)
RBC: 4.64 MIL/uL (ref 3.87–5.11)
RDW: 12.2 % (ref 11.5–15.5)
WBC: 10.4 10*3/uL (ref 4.0–10.5)
nRBC: 0 % (ref 0.0–0.2)

## 2023-02-12 LAB — BASIC METABOLIC PANEL
Anion gap: 11 (ref 5–15)
BUN: 18 mg/dL (ref 8–23)
CO2: 22 mmol/L (ref 22–32)
Calcium: 9.2 mg/dL (ref 8.9–10.3)
Chloride: 104 mmol/L (ref 98–111)
Creatinine, Ser: 0.9 mg/dL (ref 0.44–1.00)
GFR, Estimated: >60 mL/min (ref >60)
Glucose, Bld: 133 mg/dL — ABNORMAL HIGH (ref 70–99)
Potassium: 3.6 mmol/L (ref 3.5–5.1)
Sodium: 137 mmol/L (ref 135–145)

## 2023-02-12 MED ORDER — MORPHINE SULFATE (PF) 4 MG/ML IV SOLN
4.0000 mg | Freq: Once | INTRAVENOUS | Status: AC
  Administered 2023-02-12: 4 mg via INTRAVENOUS
  Filled 2023-02-12: qty 1

## 2023-02-12 MED ORDER — ONDANSETRON HCL 4 MG/2ML IJ SOLN
4.0000 mg | Freq: Once | INTRAMUSCULAR | Status: AC
  Administered 2023-02-12: 4 mg via INTRAVENOUS
  Filled 2023-02-12: qty 2

## 2023-02-12 NOTE — Discharge Instructions (Signed)
Keep your appointment with urology this week.  Continue medication as previously prescribed. Increase fluids to stay hydrated and also to urinate frequently.

## 2023-02-12 NOTE — Telephone Encounter (Signed)
Spoke with patient and scheduled for Wednesday with Apolinar Junes.

## 2023-02-12 NOTE — Telephone Encounter (Signed)
.  left message to have patient return my call.   Pt left message on triage VM asking for new patient appt for kidney stone. Pt is currently in ER.

## 2023-02-12 NOTE — ED Notes (Signed)
See triage note  Presents with left flank pain States she does have a hx of renal stones

## 2023-02-12 NOTE — Telephone Encounter (Signed)
  Chief Complaint: Left flank pain - kidney stone Symptoms: pain - last week - resolved, returned this morning Frequency:  Pertinent Negatives: Patient denies  Disposition: [x] ED /[] Urgent Care (no appt availability in office) / [] Appointment(In office/virtual)/ []  [facility]/ [] Home Care/ [] Refused Recommended Disposition /[] [facility]/ []  Follow-up with PCP Additional Notes: Pt was seen in ED  - diagnoses was kidney stone. Pain resolved. Pain returned this morning. Medication is not controlling pain at all. Pt is going to ED for care and pain management.  Answer Assessment - Initial Assessment Questions 1. MAIN CONCERN OR SYMPTOM:  "What is your main concern right now?" "What question do you have?" "What's the main symptom you're worried about?" (e.g., blood in urine, flank pain)     Extreme pain 2. ONSET: "When did the  pain  start?"     Overnight pain returned 3. BETTER-SAME-WORSE: "Are you getting better, staying the same, or getting worse compared to how you felt at your last visit to the doctor (most recent medical visit)?"     Much worse  6. VISIT DIAGNOSIS:  "What was the main symptom or problem that you were seen for?" "Were you given a diagnosis?"      Kidney stone 9. PAIN: "Is there any pain?" If Yes, ask: "How bad is it?"  (Scale 0-10; or mild, moderate, severe)    - NONE (0): no pain    - MILD (1-3): doesn't interfere with normal activities     - MODERATE (4-7): interferes with normal activities or awakens from sleep     - SEVERE (8-10): excruciating pain, unable to do any normal activities     severe  Protocols used: Kidney Stone Follow-up Call-A-AH

## 2023-02-12 NOTE — ED Triage Notes (Signed)
Pt here with left sided flank pain since yesterday. Pt states she has a hx of kidney stones and has a 6 mm stone currently. Pt having nausea.

## 2023-02-12 NOTE — ED Provider Notes (Signed)
[facility] Provider Note    Event Date/Time   First MD Initiated Contact with Patient 02/12/23 1122     (approximate)   History   Flank Pain   HPI  Brandy [name] is a 67 y.o. female   presents to the ED with complaint of left-sided flank pain that began yesterday.  Patient has a known 6 mm stone that was diagnosed on her last ED visit on 02/07/2023.  Patient did get an appointment with urology which is later this week.  She also complains of some nausea and vomiting.  She denies any fever, chills or decreased urination.  Patient has a history of nonischemic cardiomyopathy, type 2 diabetes, CHF.      Physical Exam   Triage Vital Signs: ED Triage Vitals  Encounter Vitals Group     BP 02/12/23 1118 101/82     Systolic BP Percentile --      Diastolic BP Percentile --      Pulse Rate 02/12/23 1118 68     Resp 02/12/23 1118 17     Temp 02/12/23 1118 98 F (36.7 C)     Temp Source 02/12/23 1118 Oral     SpO2 02/12/23 1118 94 %     Weight 02/12/23 1117 229 lb 4.5 oz (104 kg)     Height 02/12/23 1117 5\' 7"  (1.702 m)     Head Circumference --      Peak Flow --      Pain Score --      Pain Loc --      Pain Education --      Exclude from Growth Chart --     Most recent vital signs: Vitals:   02/12/23 1118 02/12/23 1526  BP: 101/82 110/78  Pulse: 68 60  Resp: 17 16  Temp: 98 F (36.7 C) 98.2 F (36.8 C)  SpO2: 94% 95%     General: Awake, no distress.  Lying on right side and appearing to be quite uncomfortable. CV:  Good peripheral perfusion.  Resp:  Normal effort.  Lungs are clear bilaterally. Abd:  No distention.  Other:     ED Results / Procedures / Treatments   Labs (all labs ordered are listed, but only abnormal results are displayed) Labs Reviewed  BASIC METABOLIC PANEL - Abnormal; Notable for the following components:      Result Value   Glucose, Bld 133 (*)    All other components within normal limits  URINALYSIS, ROUTINE W  REFLEX MICROSCOPIC - Abnormal; Notable for the following components:   Color, Urine YELLOW (*)    APPearance CLEAR (*)    Hgb urine dipstick LARGE (*)    Ketones, ur 20 (*)    Bacteria, UA RARE (*)    All other components within normal limits  CBC     RADIOLOGY  CT renal stone study per radiologist shows a 2 mm distal left urethral calculus migrating distally since prior study.  Mild left hydroureteronephrosis, no significant change.    PROCEDURES:  Critical Care performed:   Procedures   MEDICATIONS ORDERED IN ED: Medications  morphine (PF) 4 MG/ML injection 4 mg (4 mg Intravenous Given 02/12/23 1223)  ondansetron (ZOFRAN) injection 4 mg (4 mg Intravenous Given 02/12/23 1222)  morphine (PF) 4 MG/ML injection 4 mg (4 mg Intravenous Given 02/12/23 1357)     IMPRESSION / MDM / ASSESSMENT AND PLAN / ED COURSE  I reviewed the triage vital signs and the nursing notes.  Differential diagnosis includes, but is not limited to, left urolithiasis with worsening hydronephrosis, obstruction, urinary tract infection.  67 year old female presents to the ED with left flank pain.  Patient was seen in the emergency department on 02/07/2023 which time she had a 6 mm stone on the left.  Patient has continued to take medication as prescribed and has an appointment with urology this week.  CT scan was reassuring with the 6 mm stone not seen on imaging and patient has a 2 mm stone which appears to be migrating distally.  Urinalysis was reassuring and patient was made aware.  She states that she has medication at home and will keep her appoint with urologist this week.  Prior to discharge patient was tolerating p.o. liquids.      Patient's presentation is most consistent with acute illness / injury with system symptoms.  FINAL CLINICAL IMPRESSION(S) / ED DIAGNOSES   Final diagnoses:  Ureterolithiasis     Rx / DC Orders   ED Discharge Orders     None        Note:  This document was  prepared using Dragon voice recognition software and may include unintentional dictation errors.   Tommi Rumps, PA-C 02/12/23 1553    Minna Antis, MD 02/14/23 [phone]

## 2023-02-13 ENCOUNTER — Ambulatory Visit: Payer: Medicare Other

## 2023-02-14 ENCOUNTER — Ambulatory Visit: Payer: Medicare Other | Admitting: Urology

## 2023-02-14 VITALS — BP 134/71 | HR 82 | Ht 67.0 in | Wt 228.2 lb

## 2023-02-14 DIAGNOSIS — N201 Calculus of ureter: Secondary | ICD-10-CM | POA: Diagnosis not present

## 2023-02-14 DIAGNOSIS — N200 Calculus of kidney: Secondary | ICD-10-CM | POA: Diagnosis not present

## 2023-02-14 DIAGNOSIS — K5909 Other constipation: Secondary | ICD-10-CM

## 2023-02-14 MED ORDER — TAMSULOSIN HCL 0.4 MG PO CAPS: 0.4000 mg | ORAL_CAPSULE | Freq: Every day | ORAL | 1 refills | Status: DC

## 2023-02-14 NOTE — Progress Notes (Signed)
[name],acting as a scribe for Vanna Scotland, MD.,have documented all relevant documentation on the behalf of Vanna Scotland, MD,as directed by  Vanna Scotland, MD while in the presence of Vanna Scotland, MD.  02/14/2023 2:34 PM   [name] 01/05/1956 [id number]  Referring provider: Reubin Milan, MD [address] [place],  [place] [address]  Chief Complaint  Patient presents with   Establish Care   Nephrolithiasis    HPI: 67 year-old female who presents today for further evaluation of a kidney stone event. She was seen twice in the emergency room, on 02/07/23 and 02/12/23 for a left distal ureteral stone. She underwent 2 individual CT scans that showed some slight distal migration of a approximately 4 mm left distal ureteral calculus. She did have some blood in her urine, otherwise no signs or symptoms of infection. Here creatinine was within the normal range. She has no other additional upper tract stones.   She reports a history of kidney stones in 2014 and the late 1990s but has never required surgery.   Currently, she experiences a sensation of needing to urinate frequently but no severe pain. The last significant pain episode was yesterday afternoon, which was managed with Advil. She also reports constipation and attributes it to the pain medication that she received in the ER.   Her urinalysis today was negative.   PMH: Past Medical History:  Diagnosis Date   Acute lower GI bleeding 01/19/2020   Arthritis    Atrial fibrillation with rapid ventricular response (HCC)    Chicken pox    Chronic systolic CHF (congestive heart failure) (HCC)    a. 03/2016 Echo: Ef 15-20%, diff HK, ant AK;  b. 05/2016 Echo: Ef 30-35%, diff HK, mildly dil LA/RA.   Diabetes mellitus without complication (HCC)    Dysrhythmia    GERD (gastroesophageal reflux disease)    Heart murmur    Hip discomfort    been going on for 40 years    History of kidney stones    Hyperlipidemia  associated with type 2 diabetes mellitus (HCC) 05/12/2021   Hypertension    Moderate mitral regurgitation    a. 03/2016 Echo: mod MR in setting of LV dysfxn.   Motion sickness    car - back seat   NICM (nonischemic cardiomyopathy) (HCC)    a. 03/2016 Echo: EF 15-20%, diff HK, ant AK, mod MR, mod dil LA, mildly dil RA;  b. 04/2016 Cath: nl cors;  c. 05/2016 Echo: EF 30-35%, diff HK.   Obesity    Obstructive sleep apnea    compliant with CPAP   Persistent atrial fibrillation (HCC)    a. Dx 03/2016;  b. CHA2DS2VASc = 2-->Eliquis 5mg  BID;  b. 05/2016 Failed DCCV x 4.   Sleep apnea    Stomach irritation    Visit for monitoring Tikosyn therapy 07/24/2016    Surgical History: Past Surgical History:  Procedure Laterality Date   ATRIAL FIBRILLATION ABLATION N/A 12/09/2020   Procedure: ATRIAL FIBRILLATION ABLATION;  Surgeon: Lanier Prude, MD;  Location: [facility];  Service: Cardiovascular;  Laterality: N/A;   BIOPSY N/A 01/12/2020   Procedure: BIOPSY;  Surgeon: Pasty Spillers, MD;  Location: [facility];  Service: Endoscopy;  Laterality: N/A;   CARDIAC CATHETERIZATION N/A 04/17/2016   Procedure: Left Heart Cath and Coronary Angiography;  Surgeon: Iran Ouch, MD;  Location: [facility] CV [facility];  Service: Cardiovascular;  Laterality: N/A;   CARDIOVERSION N/A  10/22/2020   Procedure: CARDIOVERSION;  Surgeon: Yvonne Kendall, MD;  Location: [facility] ORS;  Service: Cardiovascular;  Laterality: N/A;   CHOLECYSTECTOMY     COLON SURGERY  2021   COLONOSCOPY N/A 01/20/2020   Procedure: COLONOSCOPY;  Surgeon: Regis Bill, MD;  Location: [facility];  Service: Endoscopy;  Laterality: N/A;   COLONOSCOPY WITH PROPOFOL N/A 01/12/2020   Procedure: COLONOSCOPY WITH PROPOFOL;  Surgeon: Pasty Spillers, MD;  Location: [facility];  Service: Endoscopy;  Laterality: N/A;  Diabetic - oral meds sleep apnea   ELECTROPHYSIOLOGIC STUDY N/A 05/26/2016   Procedure:  Cardioversion;  Surgeon: Iran Ouch, MD;  Location: [facility] OR[facility];  Service: Cardiovascular;  Laterality: N/A;   ESOPHAGOGASTRODUODENOSCOPY (EGD) WITH PROPOFOL N/A 01/12/2020   Procedure: ESOPHAGOGASTRODUODENOSCOPY (EGD) WITH PROPOFOL;  Surgeon: Pasty Spillers, MD;  Location: [facility];  Service: Endoscopy;  Laterality: N/A;   POLYPECTOMY N/A 01/12/2020   Procedure: POLYPECTOMY;  Surgeon: Pasty Spillers, MD;  Location: [facility];  Service: Endoscopy;  Laterality: N/A;    Home Medications:  Allergies as of 02/14/2023   No Known Allergies      Medication List        Accurate as of February 14, 2023  2:34 PM. If you have any questions, ask your nurse or doctor.          STOP taking these medications    diclofenac 50 MG EC tablet Commonly known as: VOLTAREN Stopped by: Vanna Scotland   gabapentin 100 MG capsule Commonly known as: NEURONTIN Stopped by: Vanna Scotland   oxyCODONE-acetaminophen 5-325 MG tablet Commonly known as: Percocet Stopped by: Vanna Scotland       TAKE these medications    ascorbic acid 500 MG tablet Commonly known as: VITAMIN C Take 500 mg by mouth daily.   CALCIUM PO Take by mouth daily.   Celery Seed Oil Using as needed to help with constipation   cyanocobalamin 500 MCG tablet Commonly known as: VITAMIN B12 Take 500 mcg by mouth daily.   furosemide 20 MG tablet Commonly known as: LASIX Take 1 tablet (20 mg total) by mouth daily as needed for fluid or edema.   Lavender Oil Oil Apply 1 application topically daily as needed (stress).   Lemon Oil Oil Take 1 application by mouth daily as needed (add to water).   Lemongrass Oil Oil Apply 1 application topically daily as needed (multiple uses).   Mag Glycinate 100 MG Tabs Take 270 mg by mouth in the morning, at noon, and at bedtime.   metFORMIN 500 MG 24 hr tablet Commonly known as: GLUCOPHAGE-XR Take 1/2 (one-half) tablet by mouth twice daily    metoprolol tartrate 50 MG tablet Commonly known as: LOPRESSOR Take 1/2 (one-half) tablet by mouth twice daily   multivitamin with minerals tablet Take 1 tablet by mouth daily.   Oil Base Oil Breathe oil as needed for congestion   OIL OF OREGANO PO Uses as needed   ondansetron 4 MG disintegrating tablet Commonly known as: ZOFRAN-ODT Take 1 tablet (4 mg total) by mouth every 8 (eight) hours as needed for nausea or vomiting.   OneTouch Delica Plus Lancet33G Misc USE  1 TO CHECK GLUCOSE UP TO 4 TIMES DAILY AS DIRECTED   OneTouch Verio test strip Generic drug: glucose blood USE 1 STRIP TO CHECK GLUCOSE UP TO 4 TIMES DAILY AS DIRECTED   peppermint oil liquid Apply 1 application topically daily as needed (itchy/ cold symptoms).   tamsulosin 0.4 MG  Caps capsule Commonly known as: Flomax Take 1 capsule (0.4 mg total) by mouth daily.   Tea Tree Oil Oil Apply 1 application topically daily as needed (fungus).   Vitamin D3 25 MCG (1000 UT) Caps Take 1,000 Units by mouth 2 (two) times daily.   vitamin E 45 MG (100 UNITS) capsule Take by mouth daily.        Family History: Family History  Problem Relation Age of Onset   COPD Mother    Anxiety disorder Mother    Arthritis Mother    Asthma Mother    Depression Mother    COPD Father    Heart disease Father    Hypertension Father    Arthritis Father    Varicose Veins Father    Diabetes Paternal Aunt    Cancer Maternal Aunt     Social History:  reports that she has never smoked. She has never used smokeless tobacco. She reports that she does not drink alcohol and does not use drugs.   Physical Exam: BP 134/71   Pulse 82   Ht 5\' 7"  (1.702 m)   Wt 228 lb 4 oz (103.5 kg)   BMI 35.75 kg/m   Constitutional:  Alert and oriented, No acute distress. HEENT: [place] AT, moist mucus membranes.  Trachea midline, no masses. Neurologic: Grossly intact, no focal deficits, moving all 4 extremities. Psychiatric: Normal mood and  affect.   Pertinent Imaging: EXAM: CT ABDOMEN AND PELVIS WITHOUT CONTRAST  TECHNIQUE: Multidetector CT imaging of the abdomen and pelvis was performed following the standard protocol without IV contrast.  RADIATION DOSE REDUCTION: This exam was performed according to the departmental dose-optimization program which includes automated exposure control, adjustment of the mA and/or kV according to patient size and/or use of iterative reconstruction technique.  COMPARISON:  02/07/2023  FINDINGS: Lower chest: No acute findings.  Hepatobiliary: No mass visualized on this unenhanced exam. Prior cholecystectomy. No evidence of biliary obstruction.  Pancreas: No mass or inflammatory process visualized on this unenhanced exam.  Spleen:  Within normal limits in size.  Adrenals/Urinary tract: 2 mm distal left ureteral calculus shows slight distal migration since prior study. Mild left hydroureteronephrosis shows no significant change. Unremarkable unopacified urinary bladder.  Stomach/Bowel: Small hiatal hernia noted. No evidence of obstruction, inflammatory process, or abnormal fluid collections. Normal appendix visualized. Diverticulosis is seen mainly involving the descending and sigmoid colon, however there is no evidence of diverticulitis.  Vascular/Lymphatic: No pathologically enlarged lymph nodes identified. No evidence of abdominal aortic aneurysm.  Reproductive:  No mass or other significant abnormality.  Other:  None.  Musculoskeletal:  No suspicious bone lesions identified.  IMPRESSION: 2 mm distal left ureteral calculus shows slight distal migration since prior study.  Mild left hydroureteronephrosis shows no significant change.  Colonic diverticulosis, without radiographic evidence of diverticulitis.  Small hiatal hernia.   Electronically Signed By: Danae Orleans M.D. On: 02/12/2023 14:58  This was personally reviewed and I agree with the radiologic  interpretation.   EXAM: CT ABDOMEN AND PELVIS WITHOUT CONTRAST   TECHNIQUE: Multidetector CT imaging of the abdomen and pelvis was performed following the standard protocol without IV contrast.   RADIATION DOSE REDUCTION: This exam was performed according to the departmental dose-optimization program which includes automated exposure control, adjustment of the mA and/or kV according to patient size and/or use of iterative reconstruction technique.   COMPARISON:  None Available.   FINDINGS: Lower chest: No acute abnormality.   Hepatobiliary: No focal liver abnormality is seen. Status  post cholecystectomy. No biliary dilatation.   Pancreas: Unremarkable. No pancreatic ductal dilatation or surrounding inflammatory changes.   Spleen: Normal in size without focal abnormality.   Adrenals/Urinary Tract: Adrenal glands appear normal. Mild left hydroureteronephrosis is noted secondary to 6 mm calculus in distal left ureter. Urinary bladder is decompressed.   Stomach/Bowel: Stomach is within normal limits. Appendix appears normal. No evidence of bowel wall thickening, distention, or inflammatory changes. Sigmoid diverticulosis without inflammation.   Vascular/Lymphatic: No significant vascular findings are present. No enlarged abdominal or pelvic lymph nodes.   Reproductive: Uterus and bilateral adnexa are unremarkable.   Other: No abdominal wall hernia or abnormality. No abdominopelvic ascites.   Musculoskeletal: No acute or significant osseous findings.   IMPRESSION: Mild left hydroureteronephrosis is noted secondary to 6 mm calculus in distal left ureter.   Sigmoid diverticulosis without inflammation.     Electronically Signed   By: Lupita Raider M.D.   On: 02/07/2023 08:53  This was personally reviewed and I agree with the radiologic interpretation.    Assessment & Plan:    1. Kidney stones - Manage conservatively with NSAIDs, Flomax, and increased fluid  intake. - Strain urine to monitor stone passage. - Follow up KUB in 2 weeks  - Urinalysis today does not show any blood, which is reassuring - If stone is not passed by end of the month, surgical intervention may be considered - We discussed various treatment options for urolithiasis including observation with or without medical expulsive therapy, shockwave lithotripsy (SWL), ureteroscopy and laser lithotripsy with stent placement, and percutaneous nephrolithotomy.   We discussed that management is based on stone size, location, density, patient co-morbidities, and patient preference.    Stones <66mm in size have a >80% spontaneous passage rate. Data surrounding the use of tamsulosin for medical expulsive therapy is controversial, but meta analyses suggests it is most efficacious for distal stones between 5-10mm in size. Possible side effects include dizziness/lightheadedness, and retrograde ejaculation.   SWL has a lower stone free rate in a single procedure, but also a lower complication rate compared to ureteroscopy and avoids a stent and associated stent related symptoms. Possible complications include renal hematoma, steinstrasse, and need for additional treatment. We discussed the role of his increased skin to stone distance can lead to decreased efficacy with shockwave lithotripsy.   Ureteroscopy with laser lithotripsy and stent placement has a higher stone free rate than SWL in a single procedure, however increased complication rate including possible infection, ureteral injury, bleeding, and stent related morbidity. Common stent related symptoms include dysuria, urgency/frequency, and flank pain.   2. Constipation - Likely secondary to narcotic use, discussed utilizing NSAIDs in place of narcotics - Advised to use Miralax to relieve constipation - Some of her discomfort may be coming from this   Return in about 2 weeks (around 02/28/2023) for KUB . May contact us sooner if she decides to  move forward with surgical intervention.   I have reviewed the above documentation for accuracy and completeness, and I agree with the above.   Vanna Scotland, MD    [facility] [address], [address] [place], [place] [address] [phone]  I spent 47 total minutes on the day of the encounter including pre-visit review of the medical record, face-to-face time with the patient, and post visit ordering of labs/imaging/tests.

## 2023-02-15 LAB — MICROSCOPIC EXAMINATION

## 2023-02-15 LAB — URINALYSIS, COMPLETE
Bilirubin, UA: NEGATIVE
Glucose, UA: NEGATIVE
Ketones, UA: NEGATIVE
Leukocytes,UA: NEGATIVE
Nitrite, UA: NEGATIVE
Specific Gravity, UA: 1.02 (ref 1.005–1.030)
Urobilinogen, Ur: 0.2 mg/dL (ref 0.2–1.0)
pH, UA: 5.5 (ref 5.0–7.5)

## 2023-02-20 ENCOUNTER — Ambulatory Visit: Payer: Medicare Other

## 2023-02-21 ENCOUNTER — Ambulatory Visit (INDEPENDENT_AMBULATORY_CARE_PROVIDER_SITE_OTHER): Payer: Medicare Other

## 2023-02-21 DIAGNOSIS — I428 Other cardiomyopathies: Secondary | ICD-10-CM

## 2023-02-21 LAB — CUP PACEART REMOTE DEVICE CHECK
Date Time Interrogation Session: 20241008230427
Implantable Pulse Generator Implant Date: 20230928

## 2023-02-27 ENCOUNTER — Ambulatory Visit: Payer: Medicare Other | Attending: Family Medicine

## 2023-02-27 DIAGNOSIS — G8929 Other chronic pain: Secondary | ICD-10-CM | POA: Insufficient documentation

## 2023-02-27 DIAGNOSIS — M25562 Pain in left knee: Secondary | ICD-10-CM | POA: Insufficient documentation

## 2023-02-27 DIAGNOSIS — M6281 Muscle weakness (generalized): Secondary | ICD-10-CM | POA: Insufficient documentation

## 2023-02-27 NOTE — Therapy (Addendum)
OUTPATIENT PHYSICAL THERAPY KNEE TREATMENT  Patient Name: Brandy [name] MRN: [id number] DOB:20-Jan-1956, 67 y.o., female Today's Date: 02/27/2023  END OF SESSION:  PT End of Session - 02/27/23 0851     Visit Number 5    Number of Visits 17    Date for PT Re-Evaluation 03/15/23    Authorization Type eval: 09/05//24, BC[employer] Medicare 2024  EP:[place] on [place]  $10 copay  no auth req    PT Start Time [id number]    PT Stop Time 0930    PT Time Calculation (min) 43 min    Activity Tolerance Patient tolerated treatment well    Behavior During Therapy [facility] for tasks assessed/performed            Past Medical History:  Diagnosis Date   Acute lower GI bleeding 01/19/2020   Arthritis    Atrial fibrillation with rapid ventricular response (HCC)    Chicken pox    Chronic systolic CHF (congestive heart failure) (HCC)    a. 03/2016 Echo: Ef 15-20%, diff HK, ant AK;  b. 05/2016 Echo: Ef 30-35%, diff HK, mildly dil LA/RA.   Diabetes mellitus without complication (HCC)    Dysrhythmia    GERD (gastroesophageal reflux disease)    Heart murmur    Hip discomfort    been going on for 40 years    History of kidney stones    Hyperlipidemia associated with type 2 diabetes mellitus (HCC) 05/12/2021   Hypertension    Moderate mitral regurgitation    a. 03/2016 Echo: mod MR in setting of LV dysfxn.   Motion sickness    car - back seat   NICM (nonischemic cardiomyopathy) (HCC)    a. 03/2016 Echo: EF 15-20%, diff HK, ant AK, mod MR, mod dil LA, mildly dil RA;  b. 04/2016 Cath: nl cors;  c. 05/2016 Echo: EF 30-35%, diff HK.   Obesity    Obstructive sleep apnea    compliant with CPAP   Persistent atrial fibrillation (HCC)    a. Dx 03/2016;  b. CHA2DS2VASc = 2-->Eliquis 5mg  BID;  b. 05/2016 Failed DCCV x 4.   Sleep apnea    Stomach irritation    Visit for monitoring Tikosyn therapy 07/24/2016   Past Surgical History:  Procedure Laterality Date   ATRIAL FIBRILLATION ABLATION N/A 12/09/2020   Procedure: ATRIAL  FIBRILLATION ABLATION;  Surgeon: Lanier Prude, MD;  Location: [facility];  Service: Cardiovascular;  Laterality: N/A;   BIOPSY N/A 01/12/2020   Procedure: BIOPSY;  Surgeon: Pasty Spillers, MD;  Location: [facility];  Service: Endoscopy;  Laterality: N/A;   CARDIAC CATHETERIZATION N/A 04/17/2016   Procedure: Left Heart Cath and Coronary Angiography;  Surgeon: Iran Ouch, MD;  Location: [facility] INVASIVE CV LAB;  Service: Cardiovascular;  Laterality: N/A;   CARDIOVERSION N/A 10/22/2020   Procedure: CARDIOVERSION;  Surgeon: Yvonne Kendall, MD;  Location: [facility] ORS;  Service: Cardiovascular;  Laterality: N/A;   CHOLECYSTECTOMY     COLON SURGERY  2021   COLONOSCOPY N/A 01/20/2020   Procedure: COLONOSCOPY;  Surgeon: Regis Bill, MD;  Location: [facility] ENDOSCOPY;  Service: Endoscopy;  Laterality: N/A;   COLONOSCOPY WITH PROPOFOL N/A 01/12/2020   Procedure: COLONOSCOPY WITH PROPOFOL;  Surgeon: Pasty Spillers, MD;  Location: [facility];  Service: Endoscopy;  Laterality: N/A;  Diabetic - oral meds sleep apnea   ELECTROPHYSIOLOGIC STUDY N/A 05/26/2016   Procedure: Cardioversion;  Surgeon: Iran Ouch, MD;  Location: [facility] ORS;  Service: Cardiovascular;  Laterality: N/A;   ESOPHAGOGASTRODUODENOSCOPY (EGD) WITH PROPOFOL N/A 01/12/2020   Procedure: ESOPHAGOGASTRODUODENOSCOPY (EGD) WITH PROPOFOL;  Surgeon: Pasty Spillers, MD;  Location: [facility]ERY [facility];  Service: Endoscopy;  Laterality: N/A;   POLYPECTOMY N/A 01/12/2020   Procedure: POLYPECTOMY;  Surgeon: Pasty Spillers, MD;  Location: [facility];  Service: Endoscopy;  Laterality: N/A;   Patient Active Problem List   Diagnosis Date Noted   Peroneal tendinitis, left 10/13/2022   Left lumbar radiculopathy 07/26/2022   Arthralgia of left knee 07/26/2022   Pes anserinus bursitis of right knee 04/19/2022   Primary osteoarthritis of right knee 03/27/2022   Greater trochanteric pain  syndrome of right lower extremity 03/27/2022   It band syndrome, right 03/27/2022   Hyperlipidemia associated with type 2 diabetes mellitus (HCC) 05/12/2021   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   Varicose veins of leg with pain, bilateral 07/06/2020   Acquired thrombophilia (HCC) 05/24/2020   Type II diabetes mellitus with complication (HCC) 01/19/2020   Gastric polyp    Colon polyps    Varicose veins of both lower extremities 10/20/2019   Dysphagia 10/20/2019   Osteoarthritis of right hand 10/31/2016   Nonischemic cardiomyopathy (HCC) 04/14/2016   BMI 33.0-33.9,adult 04/14/2016    PCP: Reubin Milan, MD  REFERRING PROVIDER: [name], MD  REFERRING DIAG: [phone] (ICD-10-CM) - Peroneal tendinitis, left   RATIONALE FOR EVALUATION AND TREATMENT: Rehabilitation  THERAPY DIAG: Left Knee Pain  ONSET DATE: Chronic Left Knee Pain   FOLLOW-UP APPT SCHEDULED WITH REFERRING PROVIDER: No    SUBJECTIVE:                                                                                                                                                                                         SUBJECTIVE STATEMENT:  Left knee pain   PERTINENT HISTORY:  Patient reports onset of progressing left knee pain within the last couple months. Patient denies trauma to the knee and has started using a compression sleeve for stability. She has had a history of left knee pain with improvement via physical therapy but rehab for the knee ended early due to pain at the ankle. She endorsed that imaging is significant for minimal space along the joint and she experiences an occasional "catch" or "pop". She denies swelling, bruising or radiating pain below the knee.    PAIN:   Pain Intensity: Present: 0/10, Best: 0/10, Worst: 3/10 Pain location: Left knee  Pain quality:  Catching   Radiating pain: Yes  Swelling: No  Popping, catching, locking: Yes  Numbness/Tingling: No Focal weakness  or buckling: Yes Aggravating factors: Stairway, Prolonged Walking, Prolonged  sitting  Relieving factors: Rest, Ice, Compression Sleeve, Massage  24-hour pain behavior: AM: "Wobbly" and "Stiff"  PM: Activity Dependent, more activity or walking aggravates the pain History of prior back, hip, or knee injury, pain, surgery, or therapy: Yes, improvement with PT for chronic knee pain Dominant hand: right Imaging: Yes, "significant for minimal spacing between tibia and femur" Prior level of function: Independent  Occupational demands: Works at [employer] part time; Hobbies: Gardening, Yard work, Reading, [employer] flags: Negative for personal history of cancer, chills/fever, night sweats, nausea, vomiting, unexplained weight gain/loss, unrelenting pain  PRECAUTIONS: None  WEIGHT BEARING RESTRICTIONS: No  FALLS: Has patient fallen in last 6 months? No  Living Environment Lives with: lives alone Lives in: House/apartment  Patient Goals: "I would like for my knee pain to reduce and gain more stability.    OBJECTIVE:   Patient Surveys  FOTO 47, predicted to 90  Cognition Patient is oriented to person, place, and time.  Recent memory is intact.  Remote memory is intact.  Attention span and concentration are intact.  Expressive speech is intact.  Patient's fund of knowledge is within normal limits for educational level.    Gross Musculoskeletal Assessment Tremor: None Bulk: Normal Tone: Normal  GAIT: Deferred   Posture: Deferred  AROM AROM (Normal range in degrees) AROM  Hip Right Left  Flexion (125)    Extension (15)    Abduction (40)    Adduction     Internal Rotation (45)    External Rotation (45)        Knee    Flexion (135)    Extension (0)        Ankle    Dorsiflexion (20)    Plantarflexion (50)    Inversion (35)    Eversion (15    (* = pain; Blank rows = not tested)  LE MMT: MMT (out of 5) Right Left  Hip flexion 4 4  Hip extension     Hip abduction    Hip adduction    Hip internal rotation 5 4  Hip external rotation 5 4+  Knee flexion 5 5  Knee extension 5 5  Ankle dorsiflexion 5 5  Ankle plantarflexion 5 5  Ankle inversion    Ankle eversion    (* = pain; Blank rows = not tested)  Sensation Grossly intact to light touch bilateral LEs as determined by testing dermatomes L2-S2. Proprioception, and hot/cold testing deferred on this date.  Reflexes (R/L) KJ: 2+/2+  Muscle Length Hamstrings: Deferred Quadriceps Michela Pitcher): Deferred Hip flexors Maisie Fus): Deferred IT band Claiborne Rigg): R: Deferred L: Negative  Palpation  Location LEFT  RIGHT           Quadriceps 1 (lateral aspect)    Medial Hamstrings 0   Lateral Hamstrings 0   Lateral Hamstring tendon 1   Medial Hamstring tendon 1   Quadriceps tendon 0   Patella 0   Patellar Tendon 0   Tibial Tuberosity    Medial joint line 1   Lateral joint line 1   MCL    LCL    Adductor Tubercle    Pes Anserine tendon 1   Infrapatellar fat pad 0   Fibular head 1   Popliteal fossa 0   IT band  2   (Blank rows = not tested) Graded on 0-4 scale (0 = no pain, 1 = pain, 2 = pain with wincing/grimacing/flinching, 3 = pain with withdrawal, 4 = unwilling to allow palpation), (Blank  rows = not tested)  Passive Accessory Motion Tibiofemoral: Deferred  Fibula on Femur: Deferred  Patellofemoral: Superior Glide: R: Negative L: Negative Inferior Glide: R: Negative L: Negative Medial Glide: R: Negative L: Negative Lateral Glide: R: Negative L: Negative  Medial Tilt: R: Negative L: Negative Lateral Tilt: R: Not done L: Positive for discomfort   SPECIAL TESTS  Ligamentous Stability  ACL: Lachman's: R: Negative L: Negative  PCL: Posterior Drawer: R: Negative L: Negative  MCL: Valgus Stress (30 degrees flexion): R: Negative L: Negative  LCL: Varus Stress (30 degrees flexion): R: Negative L: Negative  Meniscus Tests McMurray's Test: Negative Steinmann Sign I: R:  Negative L: Negative  Patellofemoral Pain Syndrome Patellar Tilt (Lateral): R: negative  L: Negative Squatting pain: Deferred Stair climbing pain: Deferred  Patellar Tendinopathy Deferred     TODAY'S TREATMENT:  Subjective: Patient arrives to physical therapy with no reports of pain. No further questions or concerns.   Pain: 0/10 Resting Pain  Therex:  NuStep Level 1-4 x 8 min for warmup and interval history, PT manually adjusted resisted;  Seated LAQ with 5# Ankle Weight 2 x 10 x 2 holds ;  Lateral Step Down 6" BUE Support 2 x 10 BLE;  Lateral Stepping with Blue Theraband x multiple lengths;  Standing Heel Raises on Airex Pad 2 x 12 BLE;  Standing Marches with 4# AW in // bars x 24' ;  Standing Hamstring Curl with 4#AW in // bars 2 x 10;    PATIENT EDUCATION:  Education details: Plan of Care and HEP Person educated: Patient Education method: Explanation, Demonstration, and Handouts Education comprehension: verbalized understanding   HOME EXERCISE PROGRAM:  Access Code: [email or web address] URL: [email or web address] Date: 02/27/2023 Prepared by: Ria Comment  Exercises - Seated Hamstring Stretch  - 1 x daily - 7 x weekly - 3 sets - 30 hold - Sidelying Quadriceps Stretch  - 1 x daily - 7 x weekly - 3 sets - 10 reps - 30 hold - Supine Piriformis Stretch  - 1 x daily - 7 x weekly - 3 sets - 30 hold - Supine Bridge  - 1 x daily - 7 x weekly - 2-3 sets - 10 reps - 5 hold - Seated Hip Abduction with Resistance  - 1 x daily - 7 x weekly - 2-3 sets - 10 reps - 5 hold - Mini Squat with Counter Support  - 1 x daily - 7 x weekly - 2-3 sets - 10 reps - 5 hold   ASSESSMENT:  CLINICAL IMPRESSION: Patient reported to physical therapy highly motivated to improve LE strength and reduce pain. Today's session with a continued focus on LE strengthening. Patient presenting with improved pain; no reports of posterior radiating pain or knee pain within the last week. Pt tolerated  additional resistance and intensity with weight bearing exercises. PT plan to continue progressing LE strength. She is still limited with lower leg weakness and unsteadiness with prolonged single leg stance. Based on today's performance, the patient will benefit from continued skilled physical therapy focused on stretching and strengthening the LE in order to improve functional mobility and return to PLOF.   OBJECTIVE IMPAIRMENTS: decreased activity tolerance, decreased balance, difficulty walking, decreased strength, and pain.   ACTIVITY LIMITATIONS: bending, sitting, standing, squatting, and stairs  PARTICIPATION LIMITATIONS: shopping, community activity, and yard work  PERSONAL FACTORS: Age, Fitness, Past/current experiences, Time since onset of injury/illness/exacerbation, and 3+ comorbidities: OA, a-fib, OSA, and CHF  are also affecting patient's functional outcome.  REHAB POTENTIAL: Good  CLINICAL DECISION MAKING: Evolving/moderate complexity  EVALUATION COMPLEXITY: Moderate   GOALS: Goals reviewed with patient? Yes  SHORT TERM GOALS: Target date: 02/15/2023  Pt will be independent with HEP to improve strength and decrease knee pain to improve pain-free function at home and work. Baseline:  Goal status: INITIAL    LONG TERM GOALS: Target date: 03/15/2023  Pt will increase FOTO to at least 58 to demonstrate significant improvement in function at home and work related to knee pain  Baseline: 01/18/23: 47 Goal status: INITIAL  2.  Pt will decrease worst knee pain by at least 3 points on the NPRS in order to demonstrate clinically significant reduction in knee pain. Baseline: 01/18/2023: 3/10 Goal status: INITIAL  3.  Pt will increase strength of hip flexion and hip abduction by at least 1/2 MMT grade in order to demonstrate improvement in strength and function  Baseline:  Goal status: INITIAL   PLAN: PT FREQUENCY: 1-2x/week  PT DURATION: 8 weeks  PLANNED  INTERVENTIONS: Therapeutic exercises, Therapeutic activity, Neuromuscular re-education, Balance training, Gait training, Patient/Family education, Self Care, Joint mobilization, Joint manipulation, Vestibular training, Canalith repositioning, Orthotic/Fit training, DME instructions, Dry Needling, Electrical stimulation, Spinal manipulation, Spinal mobilization, Cryotherapy, Moist heat, Taping, Traction, Ultrasound, Ionotophoresis 4mg /ml Dexamethasone, Manual therapy, and Re-evaluation.  PLAN FOR NEXT SESSION: Hip Abduction and Adduction, Progress Hip and knee strengthening,     Lynnea Maizes PT, DPT, GCS  Fareeha Evon, SPT 02/27/2023 11:37 AM

## 2023-02-28 ENCOUNTER — Ambulatory Visit
Admission: RE | Admit: 2023-02-28 | Discharge: 2023-02-28 | Disposition: A | Discharge status: Home / Self Care | Payer: Medicare Other | Source: Ambulatory Visit | Attending: Urology | Admitting: Urology

## 2023-02-28 ENCOUNTER — Ambulatory Visit
Admission: RE | Admit: 2023-02-28 | Discharge: 2023-02-28 | Disposition: A | Discharge status: Home / Self Care | Payer: Medicare Other | Attending: Urology | Admitting: Urology

## 2023-02-28 ENCOUNTER — Ambulatory Visit: Payer: Medicare Other | Admitting: Urology

## 2023-02-28 VITALS — BP 137/73 | HR 69 | Ht 67.0 in | Wt 225.0 lb

## 2023-02-28 DIAGNOSIS — N200 Calculus of kidney: Secondary | ICD-10-CM

## 2023-02-28 DIAGNOSIS — I862 Pelvic varices: Secondary | ICD-10-CM | POA: Diagnosis not present

## 2023-02-28 DIAGNOSIS — N201 Calculus of ureter: Secondary | ICD-10-CM

## 2023-02-28 DIAGNOSIS — N23 Unspecified renal colic: Secondary | ICD-10-CM | POA: Diagnosis not present

## 2023-02-28 DIAGNOSIS — R350 Frequency of micturition: Secondary | ICD-10-CM

## 2023-02-28 NOTE — Progress Notes (Signed)
Brandy [name],acting as a scribe for Brandy Scotland, MD.,have documented all relevant documentation on the behalf of Brandy Scotland, MD,as directed by  Brandy Scotland, MD while in the presence of Brandy Scotland, MD.  02/28/2023 4:34 PM   Brandy [name] [birth year] [id number]  Referring provider: Reubin Milan, MD [address] [place],  [place] [address]  Chief Complaint  Patient presents with   Nephrolithiasis    HPI: 67 year-old female with a personal history of kidney stones. She presented in the office 2 weeks ago for an acute stone event. On her last scan, she had a 4 mm right distal ureteral stone.   She presents today, 2 weeks later, with follow up KUB. I personally reviewed KUB today and compared it to her most recent CT scan. There are some pelvic calcifications bilaterally. It is possible that these do represent the 3 mm stone vs phlebolith.    She reports no issues since the last visit, with the last episode of kidney pain occurring on the 8th. She describes being slightly uncomfortable but denies any bladder symptoms such as burning or blood in the urine. She has not observed any stones being passed.  PMH: Past Medical History:  Diagnosis Date   Acute lower GI bleeding 01/19/2020   Arthritis    Atrial fibrillation with rapid ventricular response (HCC)    Chicken pox    Chronic systolic CHF (congestive heart failure) (HCC)    a. 03/2016 Echo: Ef 15-20%, diff HK, ant AK;  b. 05/2016 Echo: Ef 30-35%, diff HK, mildly dil LA/RA.   Diabetes mellitus without complication (HCC)    Dysrhythmia    GERD (gastroesophageal reflux disease)    Heart murmur    Hip discomfort    been going on for 40 years    History of kidney stones    Hyperlipidemia associated with type 2 diabetes mellitus (HCC) 05/12/2021   Hypertension    Moderate mitral regurgitation    a. 03/2016 Echo: mod MR in setting of LV dysfxn.   Motion sickness    car - back seat   NICM (nonischemic  cardiomyopathy) (HCC)    a. 03/2016 Echo: EF 15-20%, diff HK, ant AK, mod MR, mod dil LA, mildly dil RA;  b. 04/2016 Cath: nl cors;  c. 05/2016 Echo: EF 30-35%, diff HK.   Obesity    Obstructive sleep apnea    compliant with CPAP   Persistent atrial fibrillation (HCC)    a. Dx 03/2016;  b. CHA2DS2VASc = 2-->Eliquis 5mg  BID;  b. 05/2016 Failed DCCV x 4.   Sleep apnea    Stomach irritation    Visit for monitoring Tikosyn therapy 07/24/2016    Surgical History: Past Surgical History:  Procedure Laterality Date   ATRIAL FIBRILLATION ABLATION N/A 12/09/2020   Procedure: ATRIAL FIBRILLATION ABLATION;  Surgeon: Brandy Prude, MD;  Location: [facility];  Service: Cardiovascular;  Laterality: N/A;   BIOPSY N/A 01/12/2020   Procedure: BIOPSY;  Surgeon: Brandy Spillers, MD;  Location: [facility];  Service: Endoscopy;  Laterality: N/A;   CARDIAC CATHETERIZATION N/A 04/17/2016   Procedure: Left Heart Cath and Coronary Angiography;  Surgeon: Brandy Ouch, MD;  Location: [facility] INVASIVE CV LAB;  Service: Cardiovascular;  Laterality: N/A;   CARDIOVERSION N/A 10/22/2020   Procedure: CARDIOVERSION;  Surgeon: Brandy Kendall, MD;  Location: [facility] ORS;  Service: Cardiovascular;  Laterality: N/A;   CHOLECYSTECTOMY     COLON SURGERY  2021  COLONOSCOPY N/A 01/20/2020   Procedure: COLONOSCOPY;  Surgeon: Brandy Bill, MD;  Location: [facility] ENDOSCOPY;  Service: Endoscopy;  Laterality: N/A;   COLONOSCOPY WITH PROPOFOL N/A 01/12/2020   Procedure: COLONOSCOPY WITH PROPOFOL;  Surgeon: Brandy Spillers, MD;  Location: [facility];  Service: Endoscopy;  Laterality: N/A;  Diabetic - oral meds sleep apnea   ELECTROPHYSIOLOGIC STUDY N/A 05/26/2016   Procedure: Cardioversion;  Surgeon: Brandy Ouch, MD;  Location: [facility] ORS;  Service: Cardiovascular;  Laterality: N/A;   ESOPHAGOGASTRODUODENOSCOPY (EGD) WITH PROPOFOL N/A 01/12/2020   Procedure: ESOPHAGOGASTRODUODENOSCOPY (EGD) WITH  PROPOFOL;  Surgeon: Brandy Spillers, MD;  Location: [facility];  Service: Endoscopy;  Laterality: N/A;   POLYPECTOMY N/A 01/12/2020   Procedure: POLYPECTOMY;  Surgeon: Brandy Spillers, MD;  Location: [facility] SURGERY [facility];  Service: Endoscopy;  Laterality: N/A;    Home Medications:  Allergies as of 02/28/2023   No Known Allergies      Medication List        Accurate as of February 28, 2023  4:34 PM. If you have any questions, ask your nurse or doctor.          STOP taking these medications    ondansetron 4 MG disintegrating tablet Commonly known as: ZOFRAN-ODT   tamsulosin 0.4 MG Caps capsule Commonly known as: Flomax       TAKE these medications    ascorbic acid 500 MG tablet Commonly known as: VITAMIN C Take 500 mg by mouth daily.   CALCIUM PO Take by mouth daily.   Celery Seed Oil Using as needed to help with constipation   cyanocobalamin 500 MCG tablet Commonly known as: VITAMIN B12 Take 500 mcg by mouth daily.   furosemide 20 MG tablet Commonly known as: LASIX Take 1 tablet (20 mg total) by mouth daily as needed for fluid or edema.   Lavender Oil Oil Apply 1 application topically daily as needed (stress).   Lemon Oil Oil Take 1 application by mouth daily as needed (add to water).   Lemongrass Oil Oil Apply 1 application topically daily as needed (multiple uses).   Mag Glycinate 100 MG Tabs Take 270 mg by mouth in the morning, at noon, and at bedtime.   metFORMIN 500 MG 24 hr tablet Commonly known as: GLUCOPHAGE-XR Take 1/2 (one-half) tablet by mouth twice daily   metoprolol tartrate 50 MG tablet Commonly known as: LOPRESSOR Take 1/2 (one-half) tablet by mouth twice daily   multivitamin with minerals tablet Take 1 tablet by mouth daily.   Oil Base Oil Breathe oil as needed for congestion   OIL OF OREGANO PO Uses as needed   OneTouch Delica Plus Lancet33G Misc USE  1 TO CHECK GLUCOSE UP TO 4 TIMES DAILY AS DIRECTED    OneTouch Verio test strip Generic drug: glucose blood USE 1 STRIP TO CHECK GLUCOSE UP TO 4 TIMES DAILY AS DIRECTED   peppermint oil liquid Apply 1 application topically daily as needed (itchy/ cold symptoms).   Tea Tree Oil Oil Apply 1 application topically daily as needed (fungus).   Vitamin D3 25 MCG (1000 UT) Caps Take 1,000 Units by mouth 2 (two) times daily.   vitamin E 45 MG (100 UNITS) capsule Take by mouth daily.        Family History: Family History  Problem Relation Age of Onset   COPD Mother    Anxiety disorder Mother    Arthritis Mother    Asthma Mother    Depression Mother  COPD Father    Heart disease Father    Hypertension Father    Arthritis Father    Varicose Veins Father    Diabetes Paternal Aunt    Cancer Maternal Aunt     Social History:  reports that she has never smoked. She has never used smokeless tobacco. She reports that she does not drink alcohol and does not use drugs.   Physical Exam: BP 137/73   Pulse 69   Ht 5\' 7"  (1.702 m)   Wt 225 lb (102.1 kg)   BMI 35.24 kg/m   Constitutional:  Alert and oriented, No acute distress. HEENT: [place] AT, moist mucus membranes.  Trachea midline, no masses. Neurologic: Grossly intact, no focal deficits, moving all 4 extremities. Psychiatric: Normal mood and affect.  Pertinent Imaging:  KUB performed today. Radiologic interpretation is pending. I personally reviewed this today.  Results for orders placed or performed in visit on 02/28/23  Microscopic Examination   Urine  Result Value Ref Range   WBC, UA 11-30 (A) 0 - 5 /hpf   RBC, Urine 0-2 0 - 2 /hpf   Epithelial Cells (non renal) 0-10 0 - 10 /hpf   Casts Present (A) None seen /lpf   Cast Type Granular casts (A) N/A   Mucus, UA Present (A) Not Estab.   Bacteria, UA Moderate (A) None seen/Few  Urinalysis, Complete  Result Value Ref Range   Specific Gravity, UA 1.025 1.005 - 1.030   pH, UA 5.0 5.0 - 7.5   Color, UA Yellow Yellow    Appearance Ur Clear Clear   Leukocytes,UA 1+ (A) Negative   Protein,UA Negative Negative/Trace   Glucose, UA Negative Negative   Ketones, UA Negative Negative   RBC, UA Negative Negative   Bilirubin, UA Negative Negative   Urobilinogen, Ur 0.2 0.2 - 1.0 mg/dL   Nitrite, UA Negative Negative   Microscopic Examination See below:     Assessment & Plan:    1. Right distal ureteral stone -KUB is not definitive today given the number phlebolith this -In the absence of pain and relatively small stone size, statistically likely that she passed the stone -Urinalysis today is also negative for any microscopic blood which is reassuring -Will hold off on any further imaging unless she has recurrent pain.  She does, will consider repeat CT scan versus going ahead and proceeding with intervention.  She is agreeable this plan.  F/u prn  I have reviewed the above documentation for accuracy and completeness, and I agree with the above.   Brandy Scotland, MD   [facility] [address], [address] [place], [place] [address] [phone]

## 2023-03-01 LAB — URINALYSIS, COMPLETE
Bilirubin, UA: NEGATIVE
Glucose, UA: NEGATIVE
Ketones, UA: NEGATIVE
Nitrite, UA: NEGATIVE
Protein,UA: NEGATIVE
RBC, UA: NEGATIVE
Specific Gravity, UA: 1.025 (ref 1.005–1.030)
Urobilinogen, Ur: 0.2 mg/dL (ref 0.2–1.0)
pH, UA: 5 (ref 5.0–7.5)

## 2023-03-01 LAB — MICROSCOPIC EXAMINATION

## 2023-03-06 ENCOUNTER — Ambulatory Visit: Payer: Medicare Other

## 2023-03-06 DIAGNOSIS — G8929 Other chronic pain: Secondary | ICD-10-CM | POA: Diagnosis not present

## 2023-03-06 DIAGNOSIS — M6281 Muscle weakness (generalized): Secondary | ICD-10-CM

## 2023-03-06 DIAGNOSIS — M25562 Pain in left knee: Secondary | ICD-10-CM | POA: Diagnosis not present

## 2023-03-06 NOTE — Therapy (Addendum)
OUTPATIENT PHYSICAL THERAPY KNEE TREATMENT  Patient Name: Brandy [name] MRN: [id number] DOB:1956-05-15, 67 y.o., female Today's Date: 03/06/2023  END OF SESSION:  PT End of Session - 03/06/23 0934     Visit Number 6    Number of Visits 17    Date for PT Re-Evaluation 03/15/23    Authorization Type eval: 09/05//24, BC[employer] Medicare 2024  [id number] on [place]  $10 copay  no auth req    PT Start Time 0930    PT Stop Time 1010    PT Time Calculation (min) 40 min    Activity Tolerance Patient tolerated treatment well    Behavior During Therapy [facility] for tasks assessed/performed            Past Medical History:  Diagnosis Date   Acute lower GI bleeding 01/19/2020   Arthritis    Atrial fibrillation with rapid ventricular response (HCC)    Chicken pox    Chronic systolic CHF (congestive heart failure) (HCC)    a. 03/2016 Echo: Ef 15-20%, diff HK, ant AK;  b. 05/2016 Echo: Ef 30-35%, diff HK, mildly dil LA/RA.   Diabetes mellitus without complication (HCC)    Dysrhythmia    GERD (gastroesophageal reflux disease)    Heart murmur    Hip discomfort    been going on for 40 years    History of kidney stones    Hyperlipidemia associated with type 2 diabetes mellitus (HCC) 05/12/2021   Hypertension    Moderate mitral regurgitation    a. 03/2016 Echo: mod MR in setting of LV dysfxn.   Motion sickness    car - back seat   NICM (nonischemic cardiomyopathy) (HCC)    a. 03/2016 Echo: EF 15-20%, diff HK, ant AK, mod MR, mod dil LA, mildly dil RA;  b. 04/2016 Cath: nl cors;  c. 05/2016 Echo: EF 30-35%, diff HK.   Obesity    Obstructive sleep apnea    compliant with CPAP   Persistent atrial fibrillation (HCC)    a. Dx 03/2016;  b. CHA2DS2VASc = 2-->Eliquis 5mg  BID;  b. 05/2016 Failed DCCV x 4.   Sleep apnea    Stomach irritation    Visit for monitoring Tikosyn therapy 07/24/2016   Past Surgical History:  Procedure Laterality Date   ATRIAL FIBRILLATION ABLATION N/A 12/09/2020   Procedure: ATRIAL  FIBRILLATION ABLATION;  Surgeon: Lanier Prude, MD;  Location: [facility] INVASIVE CV LAB;  Service: Cardiovascular;  Laterality: N/A;   BIOPSY N/A 01/12/2020   Procedure: BIOPSY;  Surgeon: Pasty Spillers, MD;  Location: [facility] SURGERY [facility]TR;  Service: Endoscopy;  Laterality: N/A;   CARDIAC CATHETERIZATION N/A 04/17/2016   Procedure: Left Heart Cath and Coronary Angiography;  Surgeon: Iran Ouch, MD;  Location: [facility] INVASIVE CV LAB;  Service: Cardiovascular;  Laterality: N/A;   CARDIOVERSION N/A 10/22/2020   Procedure: CARDIOVERSION;  Surgeon: Yvonne Kendall, MD;  Location: [facility] ORS;  Service: Cardiovascular;  Laterality: N/A;   CHOLECYSTECTOMY     COLON SURGERY  2021   COLONOSCOPY N/A 01/20/2020   Procedure: COLONOSCOPY;  Surgeon: Regis Bill, MD;  Location: [facility] ENDOSCOPY;  Service: Endoscopy;  Laterality: N/A;   COLONOSCOPY WITH PROPOFOL N/A 01/12/2020   Procedure: COLONOSCOPY WITH PROPOFOL;  Surgeon: Pasty Spillers, MD;  Location: [facility];  Service: Endoscopy;  Laterality: N/A;  Diabetic - oral meds sleep apnea   ELECTROPHYSIOLOGIC STUDY N/A 05/26/2016   Procedure: Cardioversion;  Surgeon: Iran Ouch, MD;  Location: [facility] ORS;  Service: Cardiovascular;  Laterality: N/A;   ESOPHAGOGASTRODUODENOSCOPY (EGD) WITH PROPOFOL N/A 01/12/2020   Procedure: ESOPHAGOGASTRODUODENOSCOPY (EGD) WITH PROPOFOL;  Surgeon: Pasty Spillers, MD;  Location: [facility]ERY [facility];  Service: Endoscopy;  Laterality: N/A;   POLYPECTOMY N/A 01/12/2020   Procedure: POLYPECTOMY;  Surgeon: Pasty Spillers, MD;  Location: [facility];  Service: Endoscopy;  Laterality: N/A;   Patient Active Problem List   Diagnosis Date Noted   Peroneal tendinitis, left 10/13/2022   Left lumbar radiculopathy 07/26/2022   Arthralgia of left knee 07/26/2022   Pes anserinus bursitis of right knee 04/19/2022   Primary osteoarthritis of right knee 03/27/2022   Greater trochanteric pain  syndrome of right lower extremity 03/27/2022   It band syndrome, right 03/27/2022   Hyperlipidemia associated with type 2 diabetes mellitus (HCC) 05/12/2021   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   Varicose veins of leg with pain, bilateral 07/06/2020   Acquired thrombophilia (HCC) 05/24/2020   Type II diabetes mellitus with complication (HCC) 01/19/2020   Gastric polyp    Colon polyps    Varicose veins of both lower extremities 10/20/2019   Dysphagia 10/20/2019   Osteoarthritis of right hand 10/31/2016   Nonischemic cardiomyopathy (HCC) 04/14/2016   BMI 33.0-33.9,adult 04/14/2016   PCP: Reubin Milan, MD  REFERRING PROVIDER: [name], MD  REFERRING DIAG: [phone] (ICD-10-CM) - Peroneal tendinitis, left   RATIONALE FOR EVALUATION AND TREATMENT: Rehabilitation  THERAPY DIAG: Left Knee Pain  ONSET DATE: Chronic Left Knee Pain   FOLLOW-UP APPT SCHEDULED WITH REFERRING PROVIDER: No    SUBJECTIVE:                                                                                                                                                                                         SUBJECTIVE STATEMENT:  Left knee pain   PERTINENT HISTORY:  Patient reports onset of progressing left knee pain within the last couple months. Patient denies trauma to the knee and has started using a compression sleeve for stability. She has had a history of left knee pain with improvement via physical therapy but rehab for the knee ended early due to pain at the ankle. She endorsed that imaging is significant for minimal space along the joint and she experiences an occasional "catch" or "pop". She denies swelling, bruising or radiating pain below the knee.    PAIN:   Pain Intensity: Present: 0/10, Best: 0/10, Worst: 3/10 Pain location: Left knee  Pain quality:  Catching   Radiating pain: Yes  Swelling: No  Popping, catching, locking: Yes  Numbness/Tingling: No Focal weakness  or buckling: Yes Aggravating factors: Stairway, Prolonged Walking, Prolonged sitting  Relieving factors: Rest, Ice, Compression Sleeve, Massage  24-hour pain behavior: AM: "Wobbly" and "Stiff"  PM: Activity Dependent, more activity or walking aggravates the pain History of prior back, hip, or knee injury, pain, surgery, or therapy: Yes, improvement with PT for chronic knee pain Dominant hand: right Imaging: Yes, "significant for minimal spacing between tibia and femur" Prior level of function: Independent  Occupational demands: Works at [employer] part time; Hobbies: Gardening, Yard work, Reading, [employer] flags: Negative for personal history of cancer, chills/fever, night sweats, nausea, vomiting, unexplained weight gain/loss, unrelenting pain  PRECAUTIONS: None  WEIGHT BEARING RESTRICTIONS: No  FALLS: Has patient fallen in last 6 months? No  Living Environment Lives with: lives alone Lives in: House/apartment  Patient Goals: "I would like for my knee pain to reduce and gain more stability.    OBJECTIVE:   Patient Surveys  FOTO 47, predicted to 46  Cognition Patient is oriented to person, place, and time.  Recent memory is intact.  Remote memory is intact.  Attention span and concentration are intact.  Expressive speech is intact.  Patient's fund of knowledge is within normal limits for educational level.    Gross Musculoskeletal Assessment Tremor: None Bulk: Normal Tone: Normal  GAIT: Deferred   Posture: Deferred  AROM AROM (Normal range in degrees) AROM  Hip Right Left  Flexion (125)    Extension (15)    Abduction (40)    Adduction     Internal Rotation (45)    External Rotation (45)        Knee    Flexion (135)    Extension (0)        Ankle    Dorsiflexion (20)    Plantarflexion (50)    Inversion (35)    Eversion (15    (* = pain; Blank rows = not tested)  LE MMT: MMT (out of 5) Right Left  Hip flexion 4 4  Hip extension     Hip abduction    Hip adduction    Hip internal rotation 5 4  Hip external rotation 5 4+  Knee flexion 5 5  Knee extension 5 5  Ankle dorsiflexion 5 5  Ankle plantarflexion 5 5  Ankle inversion    Ankle eversion    (* = pain; Blank rows = not tested)  Sensation Grossly intact to light touch bilateral LEs as determined by testing dermatomes L2-S2. Proprioception, and hot/cold testing deferred on this date.  Reflexes (R/L) KJ: 2+/2+  Muscle Length Hamstrings: Deferred Quadriceps Michela Pitcher): Deferred Hip flexors Maisie Fus): Deferred IT band Claiborne Rigg): R: Deferred L: Negative  Palpation  Location LEFT  RIGHT           Quadriceps 1 (lateral aspect)    Medial Hamstrings 0   Lateral Hamstrings 0   Lateral Hamstring tendon 1   Medial Hamstring tendon 1   Quadriceps tendon 0   Patella 0   Patellar Tendon 0   Tibial Tuberosity    Medial joint line 1   Lateral joint line 1   MCL    LCL    Adductor Tubercle    Pes Anserine tendon 1   Infrapatellar fat pad 0   Fibular head 1   Popliteal fossa 0   IT band  2   (Blank rows = not tested) Graded on 0-4 scale (0 = no pain, 1 = pain, 2 = pain with wincing/grimacing/flinching, 3 = pain with withdrawal, 4 = unwilling to allow palpation), (Blank rows =  not tested)  Passive Accessory Motion Tibiofemoral: Deferred  Fibula on Femur: Deferred  Patellofemoral: Superior Glide: R: Negative L: Negative Inferior Glide: R: Negative L: Negative Medial Glide: R: Negative L: Negative Lateral Glide: R: Negative L: Negative  Medial Tilt: R: Negative L: Negative Lateral Tilt: R: Not done L: Positive for discomfort   SPECIAL TESTS  Ligamentous Stability  ACL: Lachman's: R: Negative L: Negative  PCL: Posterior Drawer: R: Negative L: Negative  MCL: Valgus Stress (30 degrees flexion): R: Negative L: Negative  LCL: Varus Stress (30 degrees flexion): R: Negative L: Negative  Meniscus Tests McMurray's Test: Negative Steinmann Sign I: R:  Negative L: Negative  Patellofemoral Pain Syndrome Patellar Tilt (Lateral): R: negative  L: Negative Squatting pain: Deferred Stair climbing pain: Deferred  Patellar Tendinopathy Deferred     TODAY'S TREATMENT:   Subjective: Patient arrives to physical therapy with no new reports. Pt did report mild to moderate pain after gardening activities on Saturday; required knee brace and extensive ice/heat in order to mitigate pain. No further questions or concerns.    Pain: 0/10 Resting Pain   Therex:  NuStep Level 1-4 x 7 min for warmup and interval history, PT manually adjusted resisted;  Sit to Stand  2 x 10 (8#, 10#) (minor pain in left knee);  Seated LAQ with 5# Ankle Weight 1 x 12 x 2 holds;  Sidelying Hip Abduction, 4# Ankle weight 1 x 12 BLE;4 Forward Step Up onto 6" step FROM airex (in order to improve ankle stability and LE strength) Single UE Support 2 x 8 BLE (pain in left knee);  Lateral Stepping with Blue Theraband 2 x 56';  TRX partial squats 1 x 8, 1 x 10;  Standing Marches with 4# AW 1 x 12 BLE; Standing Hamstring Curl with 4#AW 1 x 12 BLE;   PATIENT EDUCATION:  Education details: Plan of Care and HEP Person educated: Patient Education method: Explanation, Demonstration, and Handouts Education comprehension: verbalized understanding   HOME EXERCISE PROGRAM:  Access Code: [email or web address] URL: [email or web address] Date: 02/27/2023 Prepared by: Ria Comment  Exercises - Seated Hamstring Stretch  - 1 x daily - 7 x weekly - 3 sets - 30 hold - Sidelying Quadriceps Stretch  - 1 x daily - 7 x weekly - 3 sets - 10 reps - 30 hold - Supine Piriformis Stretch  - 1 x daily - 7 x weekly - 3 sets - 30 hold - Supine Bridge  - 1 x daily - 7 x weekly - 2-3 sets - 10 reps - 5 hold - Seated Hip Abduction with Resistance  - 1 x daily - 7 x weekly - 2-3 sets - 10 reps - 5 hold - Mini Squat with Counter Support  - 1 x daily - 7 x weekly - 2-3 sets - 10 reps - 5  hold   ASSESSMENT:  CLINICAL IMPRESSION: Patient reported to physical therapy highly motivated to improve LE strength and reduce pain. Today's session with a continued focus on LE strengthening. PT decreased resistance and intensity due to consistent pain in right knee. She continues to present with deficits in LE strength.  Pt encouraged to continue adherence with HEP and stretches. Based on today's performance, the patient will benefit from continued skilled physical therapy focused on stretching and strengthening the LE in order to improve functional mobility and return to PLOF.   OBJECTIVE IMPAIRMENTS: decreased activity tolerance, decreased balance, difficulty walking, decreased strength, and pain.   ACTIVITY LIMITATIONS: bending, sitting, standing, squatting,  and stairs  PARTICIPATION LIMITATIONS: shopping, community activity, and yard work  PERSONAL FACTORS: Age, Fitness, Past/current experiences, Time since onset of injury/illness/exacerbation, and 3+ comorbidities: OA, a-fib, OSA, and CHF  are also affecting patient's functional outcome.   REHAB POTENTIAL: Good  CLINICAL DECISION MAKING: Evolving/moderate complexity  EVALUATION COMPLEXITY: Moderate   GOALS: Goals reviewed with patient? Yes  SHORT TERM GOALS: Target date: 02/15/2023  Pt will be independent with HEP to improve strength and decrease knee pain to improve pain-free function at home and work. Baseline:  Goal status: INITIAL    LONG TERM GOALS: Target date: 03/15/2023  Pt will increase FOTO to at least 58 to demonstrate significant improvement in function at home and work related to knee pain  Baseline: 01/18/23: 47 Goal status: INITIAL  2.  Pt will decrease worst knee pain by at least 3 points on the NPRS in order to demonstrate clinically significant reduction in knee pain. Baseline: 01/18/2023: 3/10 Goal status: INITIAL  3.  Pt will increase strength of hip flexion and hip abduction by at least 1/2 MMT  grade in order to demonstrate improvement in strength and function  Baseline:  Goal status: INITIAL   PLAN: PT FREQUENCY: 1-2x/week  PT DURATION: 8 weeks  PLANNED INTERVENTIONS: Therapeutic exercises, Therapeutic activity, Neuromuscular re-education, Balance training, Gait training, Patient/Family education, Self Care, Joint mobilization, Joint manipulation, Vestibular training, Canalith repositioning, Orthotic/Fit training, DME instructions, Dry Needling, Electrical stimulation, Spinal manipulation, Spinal mobilization, Cryotherapy, Moist heat, Taping, Traction, Ultrasound, Ionotophoresis 4mg /ml Dexamethasone, Manual therapy, and Re-evaluation.  PLAN FOR NEXT SESSION: Hip Abduction and Adduction, Progress Hip and knee strengthening,     Lynnea Maizes PT, DPT, GCS  Ricci Dirocco, SPT 03/06/2023 3:03 PM

## 2023-03-09 DIAGNOSIS — H524 Presbyopia: Secondary | ICD-10-CM | POA: Diagnosis not present

## 2023-03-09 DIAGNOSIS — H5213 Myopia, bilateral: Secondary | ICD-10-CM | POA: Diagnosis not present

## 2023-03-09 LAB — HM DIABETES EYE EXAM

## 2023-03-13 ENCOUNTER — Ambulatory Visit: Payer: Medicare Other | Attending: Internal Medicine | Admitting: Internal Medicine

## 2023-03-13 ENCOUNTER — Ambulatory Visit: Payer: Medicare Other

## 2023-03-13 ENCOUNTER — Encounter: Payer: Self-pay | Admitting: Internal Medicine

## 2023-03-13 VITALS — BP 127/62 | HR 63 | Ht 67.0 in | Wt 223.0 lb

## 2023-03-13 DIAGNOSIS — M6281 Muscle weakness (generalized): Secondary | ICD-10-CM

## 2023-03-13 DIAGNOSIS — I428 Other cardiomyopathies: Secondary | ICD-10-CM

## 2023-03-13 DIAGNOSIS — G8929 Other chronic pain: Secondary | ICD-10-CM | POA: Diagnosis not present

## 2023-03-13 DIAGNOSIS — I5022 Chronic systolic (congestive) heart failure: Secondary | ICD-10-CM

## 2023-03-13 DIAGNOSIS — M25562 Pain in left knee: Secondary | ICD-10-CM | POA: Diagnosis not present

## 2023-03-13 NOTE — Patient Instructions (Signed)
Medication Instructions:  The current medical regimen is effective;  continue present plan and medications.  *If you need a refill on your cardiac medications before your next appointment, please call your pharmacy*   Follow-Up: At [facility], you and your health needs are our priority.  As part of our continuing mission to provide you with exceptional heart care, we have created designated Provider Care Teams.  These Care Teams include your primary Cardiologist (physician) and Advanced Practice Providers (APPs -  Physician Assistants and Nurse Practitioners) who all work together to provide you with the care you need, when you need it.  We recommend signing up for the patient portal called "MyChart".  Sign up information is provided on this After Visit Summary.  MyChart is used to connect with patients for Virtual Visits (Telemedicine).  Patients are able to view lab/test results, encounter notes, upcoming appointments, etc.  Non-urgent messages can be sent to your provider as well.   To learn more about what you can do with MyChart, go to [email or web address].    Your next appointment:   12 month(s)  Provider:   Sherryl Manges, MD

## 2023-03-13 NOTE — Progress Notes (Signed)
Patient ID: Brandy [name], female   DOB: 09/08/1955, 67 y.o.   MRN: [id number]      Patient Care Team: Reubin Milan, MD as PCP - General (Internal Medicine) Duke Salvia, MD as Consulting Physician (Cardiology) Mia [place] Rossie Muskrat, MD as Consulting Physician (Gastroenterology) Eli Phillips, OD (Optometry)   HPI  Brandy [name] is a 67 y.o. female Seen in follow-up for atrial fibrillation for which dofetilide was started 2/18 with spontaneous conversion.She had recurrent episodes of atrial fibrillation.  She was referred to Dr. Fawn Kirk to consider ablation.  It was elected to defer.  The decision >> permanent atrial fibrillation but... Underwent catheter ablation with Dr. Merita Norton 7/22 (PVI + CFAE)  Nonischemic cardiomyopathy with persistent left ventricular dysfunction occurring in the context of her atrial fibrillation with rapid and poorly controlled rates and subsequent normalization    Anticoagulation with Apixoban previously, in order to keep recorder insertion to monitor for recurrent atrial fibrillation to determine the need for anticoagulation.  No chest pain edema nocturnal dyspnea orthopnea.  Has noted dyspnea on exertion which has been stable over at least a year.  She states that she is encroaching upon dyspnea more frequently because she is feeling so much better able to do so much more.  Last weekend, WHILE working in the yard, her Apple Watch said her heart rate up to 157.  Her AliveCor monitor symptoms was not able to record anything.   \      Thromboembolic risk factors ( HTN-1, CHF-1, Gender-1) for a CHADSVASc Score of  3    Date Cr K Mg Dig Hgb  3/18  0.76 3.9    14.5 (2/18)  4/18  0.76 4.2 2.0    5/18 0.83 4.9   15.1  2/19 0.84 4.1 2.1    10/19 0.96 4.5 2.3  13.6    9/21 0.78 3.7  0.7 9.4 >>10.7  6/22 0.77 4.2   14.6  3/23 0.77 4.2     9/24 0.9 3.6     14.2   DATE TEST EF   11/17 Echo  15-20 %   11/17 LHC  Normal CAs  1/18 Echo  35 %   5/18 Echo  50-55% LA  size-normal   8/20 Echo 60-65%   5/22 Echo 55-60%   6/23 Echo  55-60%        Past Medical History:  Diagnosis Date   Acute lower GI bleeding 01/19/2020   Arthritis    Atrial fibrillation with rapid ventricular response (HCC)    Chicken pox    Chronic systolic CHF (congestive heart failure) (HCC)    a. 03/2016 Echo: Ef 15-20%, diff HK, ant AK;  b. 05/2016 Echo: Ef 30-35%, diff HK, mildly dil LA/RA.   Diabetes mellitus without complication (HCC)    Dysrhythmia    GERD (gastroesophageal reflux disease)    Heart murmur    Hip discomfort    been going on for 40 years    History of kidney stones    Hyperlipidemia associated with type 2 diabetes mellitus (HCC) 05/12/2021   Hypertension    Moderate mitral regurgitation    a. 03/2016 Echo: mod MR in setting of LV dysfxn.   Motion sickness    car - back seat   NICM (nonischemic cardiomyopathy) (HCC)    a. 03/2016 Echo: EF 15-20%, diff HK, ant AK, mod MR, mod dil LA, mildly dil RA;  b. 04/2016 Cath: nl cors;  c. 05/2016 Echo: EF 30-35%, diff HK.  Obesity    Obstructive sleep apnea    compliant with CPAP   Persistent atrial fibrillation (HCC)    a. Dx 03/2016;  b. CHA2DS2VASc = 2-->Eliquis 5mg  BID;  b. 05/2016 Failed DCCV x 4.   Sleep apnea    Stomach irritation    Visit for monitoring Tikosyn therapy 07/24/2016    Past Surgical History:  Procedure Laterality Date   ATRIAL FIBRILLATION ABLATION N/A 12/09/2020   Procedure: ATRIAL FIBRILLATION ABLATION;  Surgeon: Lanier Prude, MD;  Location: [facility] INVASIVE CV LAB;  Service: Cardiovascular;  Laterality: N/A;   BIOPSY N/A 01/12/2020   Procedure: BIOPSY;  Surgeon: Pasty Spillers, MD;  Location: [facility]ERY [facility];  Service: Endoscopy;  Laterality: N/A;   CARDIAC CATHETERIZATION N/A 04/17/2016   Procedure: Left Heart Cath and Coronary Angiography;  Surgeon: Iran Ouch, MD;  Location: [facility] INVASIVE CV LAB;  Service: Cardiovascular;  Laterality: N/A;   CARDIOVERSION N/A 10/22/2020    Procedure: CARDIOVERSION;  Surgeon: Yvonne Kendall, MD;  Location: [facility] ORS;  Service: Cardiovascular;  Laterality: N/A;   CHOLECYSTECTOMY     COLON SURGERY  2021   COLONOSCOPY N/A 01/20/2020   Procedure: COLONOSCOPY;  Surgeon: Regis Bill, MD;  Location: [facility] ENDOSCOPY;  Service: Endoscopy;  Laterality: N/A;   COLONOSCOPY WITH PROPOFOL N/A 01/12/2020   Procedure: COLONOSCOPY WITH PROPOFOL;  Surgeon: Pasty Spillers, MD;  Location: [facility];  Service: Endoscopy;  Laterality: N/A;  Diabetic - oral meds sleep apnea   ELECTROPHYSIOLOGIC STUDY N/A 05/26/2016   Procedure: Cardioversion;  Surgeon: Iran Ouch, MD;  Location: [facility] ORS;  Service: Cardiovascular;  Laterality: N/A;   ESOPHAGOGASTRODUODENOSCOPY (EGD) WITH PROPOFOL N/A 01/12/2020   Procedure: ESOPHAGOGASTRODUODENOSCOPY (EGD) WITH PROPOFOL;  Surgeon: Pasty Spillers, MD;  Location: [facility];  Service: Endoscopy;  Laterality: N/A;   POLYPECTOMY N/A 01/12/2020   Procedure: POLYPECTOMY;  Surgeon: Pasty Spillers, MD;  Location: [facility]ERY [facility];  Service: Endoscopy;  Laterality: N/A;    Current Outpatient Medications  Medication Sig Dispense Refill   CALCIUM PO Take by mouth daily.     Celery Seed OIL Using as needed to help with constipation     Cholecalciferol (VITAMIN D3) 1000 units CAPS Take 1,000 Units by mouth 2 (two) times daily.     furosemide (LASIX) 20 MG tablet Take 1 tablet (20 mg total) by mouth daily as needed for fluid or edema. 30 tablet 0   glucose blood (ONETOUCH VERIO) test strip USE 1 STRIP TO CHECK GLUCOSE UP TO 4 TIMES DAILY AS DIRECTED 100 each 3   Lancets (ONETOUCH DELICA PLUS LANCET33G) MISC USE  1 TO CHECK GLUCOSE UP TO 4 TIMES DAILY AS DIRECTED 100 each 3   Lavender Oil OIL Apply 1 application topically daily as needed (stress).     Lemon Oil OIL Take 1 application by mouth daily as needed (add to water).     Lemongrass Oil OIL Apply 1 application topically  daily as needed (multiple uses).     Magnesium Bisglycinate (MAG GLYCINATE) 100 MG TABS Take 270 mg by mouth in the morning, at noon, and at bedtime.     metFORMIN (GLUCOPHAGE-XR) 500 MG 24 hr tablet Take 1/2 (one-half) tablet by mouth twice daily 90 tablet 3   metoprolol tartrate (LOPRESSOR) 50 MG tablet Take 1/2 (one-half) tablet by mouth twice daily 90 tablet 0   Multiple Vitamins-Minerals (MULTIVITAMIN WITH MINERALS) tablet Take 1 tablet by mouth daily.     Oil Base OIL  Breathe oil as needed for congestion     OIL OF OREGANO PO Uses as needed     peppermint oil liquid Apply 1 application topically daily as needed (itchy/ cold symptoms).     Tea Tree Oil OIL Apply 1 application topically daily as needed (fungus).     vitamin B-12 (CYANOCOBALAMIN) 500 MCG tablet Take 500 mcg by mouth daily.     vitamin C (ASCORBIC ACID) 500 MG tablet Take 500 mg by mouth daily.      vitamin E 45 MG (100 UNITS) capsule Take by mouth daily.     No current facility-administered medications for this visit.    No Known Allergies  Physical Exam: BP 127/62 (BP Location: Left Arm, Patient Position: Sitting, Cuff Size: Normal)   Pulse 63   Ht 5\' 7"  (1.702 m)   Wt 223 lb (101.2 kg)   SpO2 98%   BMI 34.93 kg/m  Well developed and nourished in no acute distress HENT normal Neck supple with JVP-  flat  Clear Regular rate and rhythm, no murmurs or gallops Abd-soft with active BS No Clubbing cyanosis edema Skin-warm and dry A & Oriented  Grossly normal sensory and motor function  ECG sinus @ 63 20/08/39   Assessment and  Plan  Atrial fibrillation-persistent status post ablation/PVI-caf without recurrence   Cardiomyopathy-- nonischemic-rate related resolved  HFpEF  Sinus bradycardia ectopic atrial rhythm  Obesity resolving  Palpitations  Pause    No interval atrial fibrillation  Resolved cardiomyopathy, interesting choice that she is on metoprolol tartrate but we will keep her on  this.  Blood pressure not likely an issue any longer; metoprolol may be having an effect but not.  With her dyspnea on exertion, likely HFpEF.  Recommendation from her primary care is to go on Farxiga, have concurred.  The pause identified on 9/25 occurred in the context of pain and vomiting with a kidney stone; still struggling with urinary urgency.  Suggested she follow-up with urology

## 2023-03-13 NOTE — Therapy (Cosign Needed)
OUTPATIENT PHYSICAL THERAPY KNEE TREATMENT  Patient Name: Brandy [name] MRN: [id number] DOB:1955-05-29, 67 y.o., female Today's Date: 03/14/2023  END OF SESSION:  PT End of Session - 03/13/23 1321     Visit Number 7    Number of Visits 17    Date for PT Re-Evaluation 03/15/23    Authorization Type eval: 09/05//24, [employer] Medicare 2024  MW:[place]  $10 copay  no auth req    PT Start Time 1315    PT Stop Time 1355    PT Time Calculation (min) 40 min    Activity Tolerance Patient tolerated treatment well    Behavior During Therapy [facility] for tasks assessed/performed             Past Medical History:  Diagnosis Date   Acute lower GI bleeding 01/19/2020   Arthritis    Atrial fibrillation with rapid ventricular response (HCC)    Chicken pox    Chronic systolic CHF (congestive heart failure) (HCC)    a. 03/2016 Echo: Ef 15-20%, diff HK, ant AK;  b. 05/2016 Echo: Ef 30-35%, diff HK, mildly dil LA/RA.   Diabetes mellitus without complication (HCC)    Dysrhythmia    GERD (gastroesophageal reflux disease)    Heart murmur    Hip discomfort    been going on for 40 years    History of kidney stones    Hyperlipidemia associated with type 2 diabetes mellitus (HCC) 05/12/2021   Hypertension    Moderate mitral regurgitation    a. 03/2016 Echo: mod MR in setting of LV dysfxn.   Motion sickness    car - back seat   NICM (nonischemic cardiomyopathy) (HCC)    a. 03/2016 Echo: EF 15-20%, diff HK, ant AK, mod MR, mod dil LA, mildly dil RA;  b. 04/2016 Cath: nl cors;  c. 05/2016 Echo: EF 30-35%, diff HK.   Obesity    Obstructive sleep apnea    compliant with CPAP   Persistent atrial fibrillation (HCC)    a. Dx 03/2016;  b. CHA2DS2VASc = 2-->Eliquis 5mg  BID;  b. 05/2016 Failed DCCV x 4.   Sleep apnea    Stomach irritation    Visit for monitoring Tikosyn therapy 07/24/2016   Past Surgical History:  Procedure Laterality Date   ATRIAL FIBRILLATION ABLATION N/A 12/09/2020   Procedure: ATRIAL  FIBRILLATION ABLATION;  Surgeon: Lanier Prude, MD;  Location: [facility];  Service: Cardiovascular;  Laterality: N/A;   BIOPSY N/A 01/12/2020   Procedure: BIOPSY;  Surgeon: Pasty Spillers, MD;  Location: [facility];  Service: Endoscopy;  Laterality: N/A;   CARDIAC CATHETERIZATION N/A 04/17/2016   Procedure: Left Heart Cath and Coronary Angiography;  Surgeon: Iran Ouch, MD;  Location: [facility] INVASIVE CV LAB;  Service: Cardiovascular;  Laterality: N/A;   CARDIOVERSION N/A 10/22/2020   Procedure: CARDIOVERSION;  Surgeon: Yvonne Kendall, MD;  Location: [facility] ORS;  Service: Cardiovascular;  Laterality: N/A;   CHOLECYSTECTOMY     COLON SURGERY  2021   COLONOSCOPY N/A 01/20/2020   Procedure: COLONOSCOPY;  Surgeon: Regis Bill, MD;  Location: [facility] ENDOSCOPY;  Service: Endoscopy;  Laterality: N/A;   COLONOSCOPY WITH PROPOFOL N/A 01/12/2020   Procedure: COLONOSCOPY WITH PROPOFOL;  Surgeon: Pasty Spillers, MD;  Location: [facility];  Service: Endoscopy;  Laterality: N/A;  Diabetic - oral meds sleep apnea   ELECTROPHYSIOLOGIC STUDY N/A 05/26/2016   Procedure: Cardioversion;  Surgeon: Iran Ouch, MD;  Location: [facility] ORS;  Service:  Cardiovascular;  Laterality: N/A;   ESOPHAGOGASTRODUODENOSCOPY (EGD) WITH PROPOFOL N/A 01/12/2020   Procedure: ESOPHAGOGASTRODUODENOSCOPY (EGD) WITH PROPOFOL;  Surgeon: Pasty Spillers, MD;  Location: [facility];  Service: Endoscopy;  Laterality: N/A;   POLYPECTOMY N/A 01/12/2020   Procedure: POLYPECTOMY;  Surgeon: Pasty Spillers, MD;  Location: [facility];  Service: Endoscopy;  Laterality: N/A;   Patient Active Problem List   Diagnosis Date Noted   Peroneal tendinitis, left 10/13/2022   Left lumbar radiculopathy 07/26/2022   Arthralgia of left knee 07/26/2022   Pes anserinus bursitis of right knee 04/19/2022   Primary osteoarthritis of right knee 03/27/2022   Greater trochanteric pain  syndrome of right lower extremity 03/27/2022   It band syndrome, right 03/27/2022   Hyperlipidemia associated with type 2 diabetes mellitus (HCC) 05/12/2021   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   Varicose veins of leg with pain, bilateral 07/06/2020   Acquired thrombophilia (HCC) 05/24/2020   Type II diabetes mellitus with complication (HCC) 01/19/2020   Gastric polyp    Colon polyps    Varicose veins of both lower extremities 10/20/2019   Dysphagia 10/20/2019   Osteoarthritis of right hand 10/31/2016   Nonischemic cardiomyopathy (HCC) 04/14/2016   BMI 33.0-33.9,adult 04/14/2016   PCP: Reubin Milan, MD  REFERRING PROVIDER: [name], MD  REFERRING DIAG: [phone] (ICD-10-CM) - Peroneal tendinitis, left   RATIONALE FOR EVALUATION AND TREATMENT: Rehabilitation  THERAPY DIAG: Left Knee Pain  ONSET DATE: Chronic Left Knee Pain   FOLLOW-UP APPT SCHEDULED WITH REFERRING PROVIDER: No    SUBJECTIVE:                                                                                                                                                                                         SUBJECTIVE STATEMENT:  Left knee pain   PERTINENT HISTORY:  Patient reports onset of progressing left knee pain within the last couple months. Patient denies trauma to the knee and has started using a compression sleeve for stability. She has had a history of left knee pain with improvement via physical therapy but rehab for the knee ended early due to pain at the ankle. She endorsed that imaging is significant for minimal space along the joint and she experiences an occasional "catch" or "pop". She denies swelling, bruising or radiating pain below the knee.    PAIN:   Pain Intensity: Present: 0/10, Best: 0/10, Worst: 3/10 Pain location: Left knee  Pain quality:  Catching   Radiating pain: Yes  Swelling: No  Popping, catching, locking: Yes  Numbness/Tingling: No Focal weakness  or buckling: Yes Aggravating factors: Stairway, Prolonged Walking,  Prolonged sitting  Relieving factors: Rest, Ice, Compression Sleeve, Massage  24-hour pain behavior: AM: "Wobbly" and "Stiff"  PM: Activity Dependent, more activity or walking aggravates the pain History of prior back, hip, or knee injury, pain, surgery, or therapy: Yes, improvement with PT for chronic knee pain Dominant hand: right Imaging: Yes, "significant for minimal spacing between tibia and femur" Prior level of function: Independent  Occupational demands: Works at [employer] part time; Hobbies: Gardening, Yard work, Reading, [employer] flags: Negative for personal history of cancer, chills/fever, night sweats, nausea, vomiting, unexplained weight gain/loss, unrelenting pain  PRECAUTIONS: None  WEIGHT BEARING RESTRICTIONS: No  FALLS: Has patient fallen in last 6 months? No  Living Environment Lives with: lives alone Lives in: House/apartment  Patient Goals: "I would like for my knee pain to reduce and gain more stability.    OBJECTIVE:   Patient Surveys  FOTO 47, predicted to 65  Cognition Patient is oriented to person, place, and time.  Recent memory is intact.  Remote memory is intact.  Attention span and concentration are intact.  Expressive speech is intact.  Patient's fund of knowledge is within normal limits for educational level.    Gross Musculoskeletal Assessment Tremor: None Bulk: Normal Tone: Normal  GAIT: Deferred   Posture: Deferred  AROM AROM (Normal range in degrees) AROM  Hip Right Left  Flexion (125)    Extension (15)    Abduction (40)    Adduction     Internal Rotation (45)    External Rotation (45)        Knee    Flexion (135)    Extension (0)        Ankle    Dorsiflexion (20)    Plantarflexion (50)    Inversion (35)    Eversion (15    (* = pain; Blank rows = not tested)  LE MMT: MMT (out of 5) Right Left  Hip flexion 4 4  Hip extension     Hip abduction    Hip adduction    Hip internal rotation 5 4  Hip external rotation 5 4+  Knee flexion 5 5  Knee extension 5 5  Ankle dorsiflexion 5 5  Ankle plantarflexion 5 5  Ankle inversion    Ankle eversion    (* = pain; Blank rows = not tested)  Sensation Grossly intact to light touch bilateral LEs as determined by testing dermatomes L2-S2. Proprioception, and hot/cold testing deferred on this date.  Reflexes (R/L) KJ: 2+/2+  Muscle Length Hamstrings: Deferred Quadriceps Michela Pitcher): Deferred Hip flexors Maisie Fus): Deferred IT band Claiborne Rigg): R: Deferred L: Negative  Palpation  Location LEFT  RIGHT           Quadriceps 1 (lateral aspect)    Medial Hamstrings 0   Lateral Hamstrings 0   Lateral Hamstring tendon 1   Medial Hamstring tendon 1   Quadriceps tendon 0   Patella 0   Patellar Tendon 0   Tibial Tuberosity    Medial joint line 1   Lateral joint line 1   MCL    LCL    Adductor Tubercle    Pes Anserine tendon 1   Infrapatellar fat pad 0   Fibular head 1   Popliteal fossa 0   IT band  2   (Blank rows = not tested) Graded on 0-4 scale (0 = no pain, 1 = pain, 2 = pain with wincing/grimacing/flinching, 3 = pain with withdrawal, 4 = unwilling to allow palpation), (  Blank rows = not tested)  Passive Accessory Motion Tibiofemoral: Deferred  Fibula on Femur: Deferred  Patellofemoral: Superior Glide: R: Negative L: Negative Inferior Glide: R: Negative L: Negative Medial Glide: R: Negative L: Negative Lateral Glide: R: Negative L: Negative  Medial Tilt: R: Negative L: Negative Lateral Tilt: R: Not done L: Positive for discomfort   SPECIAL TESTS  Ligamentous Stability  ACL: Lachman's: R: Negative L: Negative  PCL: Posterior Drawer: R: Negative L: Negative  MCL: Valgus Stress (30 degrees flexion): R: Negative L: Negative  LCL: Varus Stress (30 degrees flexion): R: Negative L: Negative  Meniscus Tests McMurray's Test: Negative Steinmann Sign I: R:  Negative L: Negative  Patellofemoral Pain Syndrome Patellar Tilt (Lateral): R: negative  L: Negative Squatting pain: Deferred Stair climbing pain: Deferred  Patellar Tendinopathy Deferred     TODAY'S TREATMENT:   Subjective: Patient arrives to physical therapy with no new reports. Pt did report mild to moderate pain after gardening activities on Saturday; required knee brace and extensive ice/heat in order to mitigate pain. No further questions or concerns.    Pain: 0/10 Resting Pain   Therex:  NuStep Level 1-4 x 5 min for warmup and interval history, PT manually adjusted resisted;  Supine SLR 2 x 10 BLE;  R Sidelying Hip Abduction, 4# Ankle weight 1 x 12 BLE; Sit to Stand  2 x 10 (8#, 10#) (minor pain in left knee);  TRX partial squats 1 x 8, 1 x 10; Standing Hamstring Curl with 4#AW 1 x 12 BLE;  Manual Therapy  Belt Assisted Mobilization with movement Passive External Rotation Movement 30s/bout x 3 bouts;   PATIENT EDUCATION:  Education details: Plan of Care and HEP Person educated: Patient Education method: Explanation, Demonstration, and Handouts Education comprehension: verbalized understanding   HOME EXERCISE PROGRAM:  Access Code: [email or web address] URL: [email or web address] Date: 02/27/2023 Prepared by: Ria Comment  Exercises - Seated Hamstring Stretch  - 1 x daily - 7 x weekly - 3 sets - 30 hold - Sidelying Quadriceps Stretch  - 1 x daily - 7 x weekly - 3 sets - 10 reps - 30 hold - Supine Piriformis Stretch  - 1 x daily - 7 x weekly - 3 sets - 30 hold - Supine Bridge  - 1 x daily - 7 x weekly - 2-3 sets - 10 reps - 5 hold - Seated Hip Abduction with Resistance  - 1 x daily - 7 x weekly - 2-3 sets - 10 reps - 5 hold - Mini Squat with Counter Support  - 1 x daily - 7 x weekly - 2-3 sets - 10 reps - 5 hold   ASSESSMENT:  CLINICAL IMPRESSION: Patient reported to physical therapy highly motivated to improve LE strength and reduce pain. Today's  session with a main focus on improving LE strength. Patient tolerated increase in resistance and intensity for LE exercises today. Patient with improved external rotation following hip mobilizations with movement; pt demonstrated ability to lift left leg onto right without pain. No updates to HEP today. Based on today's performance patient will continue to benefit from skilled physical therapy focused on LE strengthening in order to improve LE strength, balance, mobility and return to prior level of function.   OBJECTIVE IMPAIRMENTS: decreased activity tolerance, decreased balance, difficulty walking, decreased strength, and pain.   ACTIVITY LIMITATIONS: bending, sitting, standing, squatting, and stairs  PARTICIPATION LIMITATIONS: shopping, community activity, and yard work  PERSONAL FACTORS: Age, Fitness, Past/current experiences, Time since onset of injury/illness/exacerbation, and  3+ comorbidities: OA, a-fib, OSA, and CHF  are also affecting patient's functional outcome.   REHAB POTENTIAL: Good  CLINICAL DECISION MAKING: Evolving/moderate complexity  EVALUATION COMPLEXITY: Moderate   GOALS: Goals reviewed with patient? Yes  SHORT TERM GOALS: Target date: 02/15/2023  Pt will be independent with HEP to improve strength and decrease knee pain to improve pain-free function at home and work. Baseline:  Goal status: INITIAL    LONG TERM GOALS: Target date: 03/15/2023  Pt will increase FOTO to at least 58 to demonstrate significant improvement in function at home and work related to knee pain  Baseline: 01/18/23: 47 Goal status: INITIAL  2.  Pt will decrease worst knee pain by at least 3 points on the NPRS in order to demonstrate clinically significant reduction in knee pain. Baseline: 01/18/2023: 3/10 Goal status: INITIAL  3.  Pt will increase strength of hip flexion and hip abduction by at least 1/2 MMT grade in order to demonstrate improvement in strength and function  Baseline:   Goal status: INITIAL   PLAN: PT FREQUENCY: 1-2x/week  PT DURATION: 6 weeks  PLANNED INTERVENTIONS: Therapeutic exercises, Therapeutic activity, Neuromuscular re-education, Balance training, Gait training, Patient/Family education, Self Care, Joint mobilization, Joint manipulation, Vestibular training, Canalith repositioning, Orthotic/Fit training, DME instructions, Dry Needling, Electrical stimulation, Spinal manipulation, Spinal mobilization, Cryotherapy, Moist heat, Taping, Traction, Ultrasound, Ionotophoresis 4mg /ml Dexamethasone, Manual therapy, and Re-evaluation.  PLAN FOR NEXT SESSION: Update outcome measures/goals, recertification, hip Abduction and Adduction, Progress Hip and knee strengthening,     Sharalyn Ink Huprich PT, DPT, GCS  Miela Desjardin, SPT 03/14/2023 9:01 AM

## 2023-03-20 ENCOUNTER — Other Ambulatory Visit: Payer: Self-pay | Admitting: Internal Medicine

## 2023-03-20 ENCOUNTER — Ambulatory Visit: Payer: Medicare Other | Attending: Family Medicine

## 2023-03-20 DIAGNOSIS — G8929 Other chronic pain: Secondary | ICD-10-CM | POA: Diagnosis not present

## 2023-03-20 DIAGNOSIS — M25562 Pain in left knee: Secondary | ICD-10-CM | POA: Insufficient documentation

## 2023-03-20 DIAGNOSIS — M6281 Muscle weakness (generalized): Secondary | ICD-10-CM | POA: Diagnosis not present

## 2023-03-20 NOTE — Therapy (Addendum)
OUTPATIENT PHYSICAL THERAPY KNEE TREATMENT/RECERTIFICATION   Patient Name: Brandy [name] MRN: [id number] DOB:Apr 17, 1956, 67 y.o., female Today's Date: 03/20/2023  END OF SESSION:  PT End of Session - 03/20/23 0854     Visit Number 8    Number of Visits 23    Date for PT Re-Evaluation 05/01/23    Authorization Type eval: 09/05//24, BC[employer] Medicare 2024  [id number] on [place]  $10 copay  no auth req    PT Start Time 0845    PT Stop Time 0925    PT Time Calculation (min) 40 min    Activity Tolerance Patient tolerated treatment well    Behavior During Therapy [facility] for tasks assessed/performed              Past Medical History:  Diagnosis Date   Acute lower GI bleeding 01/19/2020   Arthritis    Atrial fibrillation with rapid ventricular response (HCC)    Chicken pox    Chronic systolic CHF (congestive heart failure) (HCC)    a. 03/2016 Echo: Ef 15-20%, diff HK, ant AK;  b. 05/2016 Echo: Ef 30-35%, diff HK, mildly dil LA/RA.   Diabetes mellitus without complication (HCC)    Dysrhythmia    GERD (gastroesophageal reflux disease)    Heart murmur    Hip discomfort    been going on for 40 years    History of kidney stones    Hyperlipidemia associated with type 2 diabetes mellitus (HCC) 05/12/2021   Hypertension    Moderate mitral regurgitation    a. 03/2016 Echo: mod MR in setting of LV dysfxn.   Motion sickness    car - back seat   NICM (nonischemic cardiomyopathy) (HCC)    a. 03/2016 Echo: EF 15-20%, diff HK, ant AK, mod MR, mod dil LA, mildly dil RA;  b. 04/2016 Cath: nl cors;  c. 05/2016 Echo: EF 30-35%, diff HK.   Obesity    Obstructive sleep apnea    compliant with CPAP   Persistent atrial fibrillation (HCC)    a. Dx 03/2016;  b. CHA2DS2VASc = 2-->Eliquis 5mg  BID;  b. 05/2016 Failed DCCV x 4.   Sleep apnea    Stomach irritation    Visit for monitoring Tikosyn therapy 07/24/2016   Past Surgical History:  Procedure Laterality Date   ATRIAL FIBRILLATION ABLATION N/A 12/09/2020    Procedure: ATRIAL FIBRILLATION ABLATION;  Surgeon: Lanier Prude, MD;  Location: [facility];  Service: Cardiovascular;  Laterality: N/A;   BIOPSY N/A 01/12/2020   Procedure: BIOPSY;  Surgeon: Pasty Spillers, MD;  Location: [facility];  Service: Endoscopy;  Laterality: N/A;   CARDIAC CATHETERIZATION N/A 04/17/2016   Procedure: Left Heart Cath and Coronary Angiography;  Surgeon: Iran Ouch, MD;  Location: [facility] INVASIVE CV LAB;  Service: Cardiovascular;  Laterality: N/A;   CARDIOVERSION N/A 10/22/2020   Procedure: CARDIOVERSION;  Surgeon: Yvonne Kendall, MD;  Location: [facility] ORS;  Service: Cardiovascular;  Laterality: N/A;   CHOLECYSTECTOMY     COLON SURGERY  2021   COLONOSCOPY N/A 01/20/2020   Procedure: COLONOSCOPY;  Surgeon: Regis Bill, MD;  Location: [facility] ENDOSCOPY;  Service: Endoscopy;  Laterality: N/A;   COLONOSCOPY WITH PROPOFOL N/A 01/12/2020   Procedure: COLONOSCOPY WITH PROPOFOL;  Surgeon: Pasty Spillers, MD;  Location: [facility];  Service: Endoscopy;  Laterality: N/A;  Diabetic - oral meds sleep apnea   ELECTROPHYSIOLOGIC STUDY N/A 05/26/2016   Procedure: Cardioversion;  Surgeon: Iran Ouch, MD;  Location: [facility] ORS;  Service: Cardiovascular;  Laterality: N/A;   ESOPHAGOGASTRODUODENOSCOPY (EGD) WITH PROPOFOL N/A 01/12/2020   Procedure: ESOPHAGOGASTRODUODENOSCOPY (EGD) WITH PROPOFOL;  Surgeon: Pasty Spillers, MD;  Location: [facility];  Service: Endoscopy;  Laterality: N/A;   POLYPECTOMY N/A 01/12/2020   Procedure: POLYPECTOMY;  Surgeon: Pasty Spillers, MD;  Location: [facility];  Service: Endoscopy;  Laterality: N/A;   Patient Active Problem List   Diagnosis Date Noted   Peroneal tendinitis, left 10/13/2022   Left lumbar radiculopathy 07/26/2022   Arthralgia of left knee 07/26/2022   Pes anserinus bursitis of right knee 04/19/2022   Primary osteoarthritis of right knee 03/27/2022   Greater  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 03/27/2022   It band syndrome, right 03/27/2022   Hyperlipidemia associated with type 2 diabetes mellitus (HCC) 05/12/2021   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   Varicose veins of leg with pain, bilateral 07/06/2020   Acquired thrombophilia (HCC) 05/24/2020   Type II diabetes mellitus with complication (HCC) 01/19/2020   Gastric polyp    Colon polyps    Varicose veins of both lower extremities 10/20/2019   Dysphagia 10/20/2019   Osteoarthritis of right hand 10/31/2016   Nonischemic cardiomyopathy (HCC) 04/14/2016   BMI 33.0-33.9,adult 04/14/2016   PCP: Reubin Milan, MD  REFERRING PROVIDER: [name], MD  REFERRING DIAG: [phone] (ICD-10-CM) - Peroneal tendinitis, left   RATIONALE FOR EVALUATION AND TREATMENT: Rehabilitation  THERAPY DIAG: Left Knee Pain  ONSET DATE: Chronic Left Knee Pain   FOLLOW-UP APPT SCHEDULED WITH REFERRING PROVIDER: No    SUBJECTIVE:                                                                                                                                                                                         SUBJECTIVE STATEMENT:  Left knee pain   PERTINENT HISTORY:  Patient reports onset of progressing left knee pain within the last couple months. Patient denies trauma to the knee and has started using a compression sleeve for stability. She has had a history of left knee pain with improvement via physical therapy but rehab for the knee ended early due to pain at the ankle. She endorsed that imaging is significant for minimal space along the joint and she experiences an occasional "catch" or "pop". She denies swelling, bruising or radiating pain below the knee.    PAIN:   Pain Intensity: Present: 0/10, Best: 0/10, Worst: 3/10 Pain location: Left knee  Pain quality:  Catching   Radiating pain: Yes  Swelling: No  Popping, catching, locking: Yes  Numbness/Tingling:  No Focal weakness or buckling: Yes Aggravating factors: Stairway, Prolonged  Walking, Prolonged sitting  Relieving factors: Rest, Ice, Compression Sleeve, Massage  24-hour pain behavior: AM: "Wobbly" and "Stiff"  PM: Activity Dependent, more activity or walking aggravates the pain History of prior back, hip, or knee injury, pain, surgery, or therapy: Yes, improvement with PT for chronic knee pain Dominant hand: right Imaging: Yes, "significant for minimal spacing between tibia and femur" Prior level of function: Independent  Occupational demands: Works at [employer] part time; Hobbies: Gardening, Yard work, Reading, [employer] flags: Negative for personal history of cancer, chills/fever, night sweats, nausea, vomiting, unexplained weight gain/loss, unrelenting pain  PRECAUTIONS: None  WEIGHT BEARING RESTRICTIONS: No  FALLS: Has patient fallen in last 6 months? No  Living Environment Lives with: lives alone Lives in: House/apartment  Patient Goals: "I would like for my knee pain to reduce and gain more stability.    OBJECTIVE:   Patient Surveys  FOTO 47, predicted to 86  Cognition Patient is oriented to person, place, and time.  Recent memory is intact.  Remote memory is intact.  Attention span and concentration are intact.  Expressive speech is intact.  Patient's fund of knowledge is within normal limits for educational level.    Gross Musculoskeletal Assessment Tremor: None Bulk: Normal Tone: Normal  GAIT: Deferred   Posture: Deferred  AROM AROM (Normal range in degrees) AROM  Hip Right Left  Flexion (125)    Extension (15)    Abduction (40)    Adduction     Internal Rotation (45)    External Rotation (45)        Knee    Flexion (135)    Extension (0)        Ankle    Dorsiflexion (20)    Plantarflexion (50)    Inversion (35)    Eversion (15    (* = pain; Blank rows = not tested)  LE MMT: MMT (out of 5) Right Left  Hip flexion  4 4  Hip extension    Hip abduction    Hip adduction    Hip internal rotation 5 4  Hip external rotation 5 4+  Knee flexion 5 5  Knee extension 5 5  Ankle dorsiflexion 5 5  Ankle plantarflexion 5 5  Ankle inversion    Ankle eversion    (* = pain; Blank rows = not tested)  Sensation Grossly intact to light touch bilateral LEs as determined by testing dermatomes L2-S2. Proprioception, and hot/cold testing deferred on this date.  Reflexes (R/L) KJ: 2+/2+  Muscle Length Hamstrings: Deferred Quadriceps Michela Pitcher): Deferred Hip flexors Maisie Fus): Deferred IT band Claiborne Rigg): R: Deferred L: Negative  Palpation  Location LEFT  RIGHT           Quadriceps 1 (lateral aspect)    Medial Hamstrings 0   Lateral Hamstrings 0   Lateral Hamstring tendon 1   Medial Hamstring tendon 1   Quadriceps tendon 0   Patella 0   Patellar Tendon 0   Tibial Tuberosity    Medial joint line 1   Lateral joint line 1   MCL    LCL    Adductor Tubercle    Pes Anserine tendon 1   Infrapatellar fat pad 0   Fibular head 1   Popliteal fossa 0   IT band  2   (Blank rows = not tested) Graded on 0-4 scale (0 = no pain, 1 = pain, 2 = pain with wincing/grimacing/flinching, 3 = pain with withdrawal, 4 = unwilling to allow  palpation), (Blank rows = not tested)  Passive Accessory Motion Tibiofemoral: Deferred  Fibula on Femur: Deferred  Patellofemoral: Superior Glide: R: Negative L: Negative Inferior Glide: R: Negative L: Negative Medial Glide: R: Negative L: Negative Lateral Glide: R: Negative L: Negative  Medial Tilt: R: Negative L: Negative Lateral Tilt: R: Not done L: Positive for discomfort   SPECIAL TESTS  Ligamentous Stability  ACL: Lachman's: R: Negative L: Negative  PCL: Posterior Drawer: R: Negative L: Negative  MCL: Valgus Stress (30 degrees flexion): R: Negative L: Negative  LCL: Varus Stress (30 degrees flexion): R: Negative L: Negative  Meniscus Tests McMurray's Test:  Negative Steinmann Sign I: R: Negative L: Negative  Patellofemoral Pain Syndrome Patellar Tilt (Lateral): R: negative  L: Negative Squatting pain: Deferred Stair climbing pain: Deferred  Patellar Tendinopathy Deferred     TODAY'S TREATMENT:   Subjective: Patient reports to physical therapy with no new reports. Patient brought a few shoe insoles for proper fitting.  No further questions or concerns.    Pain: 0/10 Resting Pain   Therex:  NuStep Level 1-4 x 8 min for warmup and interval history, PT manually adjusted resisted;  Forward Step Down, 6" step, BUE Support 2 x 10; Staggered Sit to Stand, LLE behind 2 x 8 (8#, 10#);  TRX partial squats, BUE support 90 Elbow Flexion,  2 x 10;  Lateral Banded Walk with Blue Band in // bars 2 x 48 ft;    Updated Outcomes:  FOTO: 54'  NRPS: 1/10; MMT L Hip Abduction and L Hip Flexion: 4+/5;    PATIENT EDUCATION:  Education details: Plan of Care and HEP Person educated: Patient Education method: Explanation, Demonstration, and Handouts Education comprehension: verbalized understanding   HOME EXERCISE PROGRAM:  Access Code: [email or web address] URL: [email or web address] Date: 02/27/2023 Prepared by: Ria Comment  Exercises - Seated Hamstring Stretch  - 1 x daily - 7 x weekly - 3 sets - 30 hold - Sidelying Quadriceps Stretch  - 1 x daily - 7 x weekly - 3 sets - 10 reps - 30 hold - Supine Piriformis Stretch  - 1 x daily - 7 x weekly - 3 sets - 30 hold - Supine Bridge  - 1 x daily - 7 x weekly - 2-3 sets - 10 reps - 5 hold - Seated Hip Abduction with Resistance  - 1 x daily - 7 x weekly - 2-3 sets - 10 reps - 5 hold - Mini Squat with Counter Support  - 1 x daily - 7 x weekly - 2-3 sets - 10 reps - 5 hold   ASSESSMENT:  CLINICAL IMPRESSION: Patient reported to physical therapy highly motivated to improve LE strength and reduce pain. Today's session with a main focus on improving LE strength and reassessing progress towards  goals. Patient has demonstrated improvements in pain, strength and functional movement as indicated on her updated outcome measures today. She is able to perform deeper ranges of squats without pain but still continues to have increased pain with prolonged activities such as gardening, stairs and longer walking distances. Anticipated maximum improvement is attainable and reasonable in a generally predictable time. Based on her current limitations and today's performance PT continues to recommend skilled physical therapy 1-2x/week in order to maximize functional gains and return to prior level of function.   OBJECTIVE IMPAIRMENTS: decreased activity tolerance, decreased balance, difficulty walking, decreased strength, and pain.   ACTIVITY LIMITATIONS: bending, sitting, standing, squatting, and stairs  PARTICIPATION LIMITATIONS: shopping, community activity, and yard work  PERSONAL FACTORS: Age, Fitness, Past/current experiences, Time since onset of injury/illness/exacerbation, and 3+ comorbidities: OA, a-fib, OSA, and CHF  are also affecting patient's functional outcome.   REHAB POTENTIAL: Good  CLINICAL DECISION MAKING: Evolving/moderate complexity  EVALUATION COMPLEXITY: Moderate   GOALS: Goals reviewed with patient? Yes  SHORT TERM GOALS: Target date: 02/15/2023  Pt will be independent with HEP to improve strength and decrease knee pain to improve pain-free function at home and work. Baseline: n/a; 03/20/2023: Endorsed HEP without Verbal Cues Goal status: Goal Met    LONG TERM GOALS: Target date: 05/01/2023 Pt will increase FOTO to at least 58 to demonstrate significant improvement in function at home and work related to knee pain  Baseline: 01/18/23: 47; 03/20/2023: 59 Goal status: Goal Met  2.  Pt will decrease worst knee pain by at least 3 points on the NPRS in order to demonstrate clinically significant reduction in knee pain. Baseline: 01/18/2023: 3/10; 03/20/2023: 1/10 Goal  status: Progressing   3.  Pt will increase strength of hip flexion and hip abduction by at least 1/2 MMT grade in order to demonstrate improvement in strength and function  Baseline: 03/20/2023: 4+/5 Hip Flexion, Hip Abduction 4+/5;  Goal status: Goal Met    PLAN: PT FREQUENCY: 1x/week  PT DURATION: 6 weeks  PLANNED INTERVENTIONS: Therapeutic exercises, Therapeutic activity, Neuromuscular re-education, Balance training, Gait training, Patient/Family education, Self Care, Joint mobilization, Joint manipulation, Vestibular training, Canalith repositioning, Orthotic/Fit training, DME instructions, Dry Needling, Electrical stimulation, Spinal manipulation, Spinal mobilization, Cryotherapy, Moist heat, Taping, Traction, Ultrasound, Ionotophoresis 4mg /ml Dexamethasone, Manual therapy, and Re-evaluation.  PLAN FOR NEXT SESSION: hip Abduction and Adduction, Progress Hip and knee strengthening,     Lynnea Maizes PT, DPT, GCS  Kristine Chahal, SPT 03/20/2023 4:05 PM

## 2023-03-21 NOTE — Telephone Encounter (Signed)
Requested by patient. Future visit in 1 month.  Requested Prescriptions  Pending Prescriptions Disp Refills   metoprolol tartrate (LOPRESSOR) 50 MG tablet [Pharmacy Med Name: Metoprolol Tartrate 50 MG Oral Tablet] 90 tablet 0    Sig: Take 1/2 (one-half) tablet by mouth twice daily     Cardiovascular:  Beta Blockers Passed - 03/20/2023 12:38 PM      Passed - Last BP in normal range    BP Readings from Last 1 Encounters:  03/13/23 127/62         Passed - Last Heart Rate in normal range    Pulse Readings from Last 1 Encounters:  03/13/23 63         Passed - Valid encounter within last 6 months    Recent Outpatient Visits           1 month ago Acute cystitis with hematuria   [place] [facility] at [facility], Nyoka Cowden, MD   3 months ago Type II diabetes mellitus with complication [facility])   [place] [facility] at [facility], Nyoka Cowden, MD   5 months ago Peroneal tendinitis, left   [place] [facility], Ocie Bob, MD   6 months ago Type II diabetes mellitus with complication [facility])   [place] [facility] at [facility], Nyoka Cowden, MD   6 months ago Left lumbar radiculopathy   [facility], Ocie Bob, MD       Future Appointments             In 1 month Judithann Graves, Nyoka Cowden, MD [facility] at [facility], [facility]

## 2023-03-26 ENCOUNTER — Ambulatory Visit (INDEPENDENT_AMBULATORY_CARE_PROVIDER_SITE_OTHER): Payer: Medicare Other

## 2023-03-26 DIAGNOSIS — I4819 Other persistent atrial fibrillation: Secondary | ICD-10-CM

## 2023-03-27 ENCOUNTER — Ambulatory Visit: Payer: Medicare Other

## 2023-03-27 DIAGNOSIS — M6281 Muscle weakness (generalized): Secondary | ICD-10-CM | POA: Diagnosis not present

## 2023-03-27 DIAGNOSIS — G8929 Other chronic pain: Secondary | ICD-10-CM | POA: Diagnosis not present

## 2023-03-27 DIAGNOSIS — M25562 Pain in left knee: Secondary | ICD-10-CM | POA: Diagnosis not present

## 2023-03-27 LAB — CUP PACEART REMOTE DEVICE CHECK
Date Time Interrogation Session: 20241110231713
Implantable Pulse Generator Implant Date: 20230928

## 2023-03-27 NOTE — Therapy (Addendum)
OUTPATIENT PHYSICAL THERAPY KNEE TREATMENT  Patient Name: Brandy [name] MRN: [id number] DOB:[birth year], 67 y.o., female Today's Date: 03/27/2023  END OF SESSION:  PT End of Session - 03/27/23 0852     Visit Number 9    Number of Visits 23    Date for PT Re-Evaluation 05/01/23    Authorization Type eval: 09/05//24, [employer] Medicare 2024  [id number] on [place]  $10 copay  no auth req    PT Start Time 0848    PT Stop Time 0926    PT Time Calculation (min) 38 min    Activity Tolerance Patient tolerated treatment well    Behavior During Therapy [facility] for tasks assessed/performed             Past Medical History:  Diagnosis Date   Acute lower GI bleeding 01/19/2020   Arthritis    Atrial fibrillation with rapid ventricular response (HCC)    Chicken pox    Chronic systolic CHF (congestive heart failure) (HCC)    a. 03/2016 Echo: Ef 15-20%, diff HK, ant AK;  b. 05/2016 Echo: Ef 30-35%, diff HK, mildly dil LA/RA.   Diabetes mellitus without complication (HCC)    Dysrhythmia    GERD (gastroesophageal reflux disease)    Heart murmur    Hip discomfort    been going on for 40 years    History of kidney stones    Hyperlipidemia associated with type 2 diabetes mellitus (HCC) 05/12/2021   Hypertension    Moderate mitral regurgitation    a. 03/2016 Echo: mod MR in setting of LV dysfxn.   Motion sickness    car - back seat   NICM (nonischemic cardiomyopathy) (HCC)    a. 03/2016 Echo: EF 15-20%, diff HK, ant AK, mod MR, mod dil LA, mildly dil RA;  b. 04/2016 Cath: nl cors;  c. 05/2016 Echo: EF 30-35%, diff HK.   Obesity    Obstructive sleep apnea    compliant with CPAP   Persistent atrial fibrillation (HCC)    a. Dx 03/2016;  b. CHA2DS2VASc = 2-->Eliquis 5mg  BID;  b. 05/2016 Failed DCCV x 4.   Sleep apnea    Stomach irritation    Visit for monitoring Tikosyn therapy 07/24/2016   Past Surgical History:  Procedure Laterality Date   ATRIAL FIBRILLATION ABLATION N/A 12/09/2020   Procedure: ATRIAL  FIBRILLATION ABLATION;  Surgeon: Lanier Prude, MD;  Location: [facility] IN[facility] CV [facility];  Service: Cardiovascular;  Laterality: N/A;   BIOPSY N/A 01/12/2020   Procedure: BIOPSY;  Surgeon: Pasty Spillers, MD;  Location: [facility];  Service: Endoscopy;  Laterality: N/A;   CARDIAC CATHETERIZATION N/A 04/17/2016   Procedure: Left Heart Cath and Coronary Angiography;  Surgeon: Iran Ouch, MD;  Location: [facility] INVASIVE CV LAB;  Service: Cardiovascular;  Laterality: N/A;   CARDIOVERSION N/A 10/22/2020   Procedure: CARDIOVERSION;  Surgeon: Yvonne Kendall, MD;  Location: [facility] ORS;  Service: Cardiovascular;  Laterality: N/A;   CHOLECYSTECTOMY     COLON SURGERY  2021   COLONOSCOPY N/A 01/20/2020   Procedure: COLONOSCOPY;  Surgeon: Regis Bill, MD;  Location: [facility] ENDOSCOPY;  Service: Endoscopy;  Laterality: N/A;   COLONOSCOPY WITH PROPOFOL N/A 01/12/2020   Procedure: COLONOSCOPY WITH PROPOFOL;  Surgeon: Pasty Spillers, MD;  Location: [facility];  Service: Endoscopy;  Laterality: N/A;  Diabetic - oral meds sleep apnea   ELECTROPHYSIOLOGIC STUDY N/A 05/26/2016   Procedure: Cardioversion;  Surgeon: Iran Ouch, MD;  Location: [facility] ORS;  Service:  Cardiovascular;  Laterality: N/A;   ESOPHAGOGASTRODUODENOSCOPY (EGD) WITH PROPOFOL N/A 01/12/2020   Procedure: ESOPHAGOGASTRODUODENOSCOPY (EGD) WITH PROPOFOL;  Surgeon: Pasty Spillers, MD;  Location: [facility];  Service: Endoscopy;  Laterality: N/A;   POLYPECTOMY N/A 01/12/2020   Procedure: POLYPECTOMY;  Surgeon: Pasty Spillers, MD;  Location: [facility];  Service: Endoscopy;  Laterality: N/A;   Patient Active Problem List   Diagnosis Date Noted   Peroneal tendinitis, left 10/13/2022   Left lumbar radiculopathy 07/26/2022   Arthralgia of left knee 07/26/2022   Pes anserinus bursitis of right knee 04/19/2022   Primary osteoarthritis of right knee 03/27/2022   Greater trochanteric pain  syndrome of right lower extremity 03/27/2022   It band syndrome, right 03/27/2022   Hyperlipidemia associated with type 2 diabetes mellitus (HCC) 05/12/2021   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   Varicose veins of leg with pain, bilateral 07/06/2020   Acquired thrombophilia (HCC) 05/24/2020   Type II diabetes mellitus with complication (HCC) 01/19/2020   Gastric polyp    Colon polyps    Varicose veins of both lower extremities 10/20/2019   Dysphagia 10/20/2019   Osteoarthritis of right hand 10/31/2016   Nonischemic cardiomyopathy (HCC) 04/14/2016   BMI 33.0-33.9,adult 04/14/2016   PCP: Reubin Milan, MD  REFERRING PROVIDER: [name], MD  REFERRING DIAG: [id number] (ICD-10-CM) - Peroneal tendinitis, left   RATIONALE FOR EVALUATION AND TREATMENT: Rehabilitation  THERAPY DIAG: Left Knee Pain  ONSET DATE: Chronic Left Knee Pain   FOLLOW-UP APPT SCHEDULED WITH REFERRING PROVIDER: No    SUBJECTIVE:                                                                                                                                                                                         SUBJECTIVE STATEMENT:  Left knee pain   PERTINENT HISTORY:  Patient reports onset of progressing left knee pain within the last couple months. Patient denies trauma to the knee and has started using a compression sleeve for stability. She has had a history of left knee pain with improvement via physical therapy but rehab for the knee ended early due to pain at the ankle. She endorsed that imaging is significant for minimal space along the joint and she experiences an occasional "catch" or "pop". She denies swelling, bruising or radiating pain below the knee.    PAIN:   Pain Intensity: Present: 0/10, Best: 0/10, Worst: 3/10 Pain location: Left knee  Pain quality:  Catching   Radiating pain: Yes  Swelling: No  Popping, catching, locking: Yes  Numbness/Tingling: No Focal weakness  or buckling: Yes Aggravating factors: Stairway, Prolonged Walking,  Prolonged sitting  Relieving factors: Rest, Ice, Compression Sleeve, Massage  24-hour pain behavior: AM: "Wobbly" and "Stiff"  PM: Activity Dependent, more activity or walking aggravates the pain History of prior back, hip, or knee injury, pain, surgery, or therapy: Yes, improvement with PT for chronic knee pain Dominant hand: right Imaging: Yes, "significant for minimal spacing between tibia and femur" Prior level of function: Independent  Occupational demands: Works at [employer] part time; Hobbies: Gardening, Yard work, Reading, [employer] flags: Negative for personal history of cancer, chills/fever, night sweats, nausea, vomiting, unexplained weight gain/loss, unrelenting pain  PRECAUTIONS: None  WEIGHT BEARING RESTRICTIONS: No  FALLS: Has patient fallen in last 6 months? No  Living Environment Lives with: lives alone Lives in: House/apartment  Patient Goals: "I would like for my knee pain to reduce and gain more stability.    OBJECTIVE:   Patient Surveys  FOTO 47, predicted to 75  Cognition Patient is oriented to person, place, and time.  Recent memory is intact.  Remote memory is intact.  Attention span and concentration are intact.  Expressive speech is intact.  Patient's fund of knowledge is within normal limits for educational level.    Gross Musculoskeletal Assessment Tremor: None Bulk: Normal Tone: Normal  GAIT: Deferred   Posture: Deferred  AROM AROM (Normal range in degrees) AROM  Hip Right Left  Flexion (125)    Extension (15)    Abduction (40)    Adduction     Internal Rotation (45)    External Rotation (45)        Knee    Flexion (135)    Extension (0)        Ankle    Dorsiflexion (20)    Plantarflexion (50)    Inversion (35)    Eversion (15    (* = pain; Blank rows = not tested)  LE MMT: MMT (out of 5) Right Left  Hip flexion 4 4  Hip extension     Hip abduction    Hip adduction    Hip internal rotation 5 4  Hip external rotation 5 4+  Knee flexion 5 5  Knee extension 5 5  Ankle dorsiflexion 5 5  Ankle plantarflexion 5 5  Ankle inversion    Ankle eversion    (* = pain; Blank rows = not tested)  Sensation Grossly intact to light touch bilateral LEs as determined by testing dermatomes L2-S2. Proprioception, and hot/cold testing deferred on this date.  Reflexes (R/L) KJ: 2+/2+  Muscle Length Hamstrings: Deferred Quadriceps Michela Pitcher): Deferred Hip flexors Maisie Fus): Deferred IT band Claiborne Rigg): R: Deferred L: Negative  Palpation  Location LEFT  RIGHT           Quadriceps 1 (lateral aspect)    Medial Hamstrings 0   Lateral Hamstrings 0   Lateral Hamstring tendon 1   Medial Hamstring tendon 1   Quadriceps tendon 0   Patella 0   Patellar Tendon 0   Tibial Tuberosity    Medial joint line 1   Lateral joint line 1   MCL    LCL    Adductor Tubercle    Pes Anserine tendon 1   Infrapatellar fat pad 0   Fibular head 1   Popliteal fossa 0   IT band  2   (Blank rows = not tested) Graded on 0-4 scale (0 = no pain, 1 = pain, 2 = pain with wincing/grimacing/flinching, 3 = pain with withdrawal, 4 = unwilling to allow palpation), (  Blank rows = not tested)  Passive Accessory Motion Tibiofemoral: Deferred  Fibula on Femur: Deferred  Patellofemoral: Superior Glide: R: Negative L: Negative Inferior Glide: R: Negative L: Negative Medial Glide: R: Negative L: Negative Lateral Glide: R: Negative L: Negative  Medial Tilt: R: Negative L: Negative Lateral Tilt: R: Not done L: Positive for discomfort   SPECIAL TESTS  Ligamentous Stability  ACL: Lachman's: R: Negative L: Negative  PCL: Posterior Drawer: R: Negative L: Negative  MCL: Valgus Stress (30 degrees flexion): R: Negative L: Negative  LCL: Varus Stress (30 degrees flexion): R: Negative L: Negative  Meniscus Tests McMurray's Test: Negative Steinmann Sign I: R:  Negative L: Negative  Patellofemoral Pain Syndrome Patellar Tilt (Lateral): R: negative  L: Negative Squatting pain: Deferred Stair climbing pain: Deferred  Patellar Tendinopathy Deferred     TODAY'S TREATMENT:   Subjective: Patient reports to physical therapy with no new reports. No further questions or concerns.    Pain: 0/10 Resting Pain   Therex:  NuStep Level 1-4 x 8 min for warmup and interval history, PT manually adjusted resisted;  Sit to Stand with green TB around thighs 2 x 10;  Lateral Banded Walk with Blue Band around thighs in // bars 1 x 48 ft;   Lateral Banded Walk with Blue Band around ankles in // bars 1 x 48 ft;   TRX partial squats 2 x 10;  Reverse Bosu 1 x 8 (CGA);  Heel Raises without UE suport 2 x 15 (second set with airex pad);  Ankle Rocker to facilitate stretch in gastrocnemius 2 x 8;    PATIENT EDUCATION:  Education details: Plan of Care and HEP Person educated: Patient Education method: Explanation, Demonstration, and Handouts Education comprehension: verbalized understanding   HOME EXERCISE PROGRAM:  Access Code: [email or web address] URL: [email or web address] Date: 02/27/2023 Prepared by: Ria Comment  Exercises - Seated Hamstring Stretch  - 1 x daily - 7 x weekly - 3 sets - 30 hold - Sidelying Quadriceps Stretch  - 1 x daily - 7 x weekly - 3 sets - 10 reps - 30 hold - Supine Piriformis Stretch  - 1 x daily - 7 x weekly - 3 sets - 30 hold - Supine Bridge  - 1 x daily - 7 x weekly - 2-3 sets - 10 reps - 5 hold - Seated Hip Abduction with Resistance  - 1 x daily - 7 x weekly - 2-3 sets - 10 reps - 5 hold - Mini Squat with Counter Support  - 1 x daily - 7 x weekly - 2-3 sets - 10 reps - 5 hold   ASSESSMENT:  CLINICAL IMPRESSION: Patient highly motivated throughout entire physical therapy session today. Continued focus on progressing knee stability and LE strength. Today's session she tolerated increased intensity; able to perform  squats on reverse bosu without report of pain or LOB. Intemittent seated rest breaks due to reported fatigue. Based on today's session Pt will benefit from PT services to address deficits in strength, balance, and mobility in order to return to full function at home.   OBJECTIVE IMPAIRMENTS: decreased activity tolerance, decreased balance, difficulty walking, decreased strength, and pain.   ACTIVITY LIMITATIONS: bending, sitting, standing, squatting, and stairs  PARTICIPATION LIMITATIONS: shopping, community activity, and yard work  PERSONAL FACTORS: Age, Fitness, Past/current experiences, Time since onset of injury/illness/exacerbation, and 3+ comorbidities: OA, a-fib, OSA, and CHF  are also affecting patient's functional outcome.   REHAB POTENTIAL: Good  CLINICAL DECISION MAKING: Evolving/moderate complexity  EVALUATION  COMPLEXITY: Moderate   GOALS: Goals reviewed with patient? Yes  SHORT TERM GOALS: Target date: 02/15/2023  Pt will be independent with HEP to improve strength and decrease knee pain to improve pain-free function at home and work. Baseline: n/a; 03/20/2023: Endorsed HEP without Verbal Cues Goal status: Goal Met    LONG TERM GOALS: Target date: 05/01/2023 Pt will increase FOTO to at least 58 to demonstrate significant improvement in function at home and work related to knee pain  Baseline: 01/18/23: 47; 03/20/2023: 59 Goal status: Goal Met  2.  Pt will decrease worst knee pain by at least 3 points on the NPRS in order to demonstrate clinically significant reduction in knee pain. Baseline: 01/18/2023: 3/10; 03/20/2023: 1/10 Goal status: Progressing   3.  Pt will increase strength of hip flexion and hip abduction by at least 1/2 MMT grade in order to demonstrate improvement in strength and function  Baseline: 03/20/2023: 4+/5 Hip Flexion, Hip Abduction 4+/5;  Goal status: Goal Met    PLAN: PT FREQUENCY: 1x/week  PT DURATION: 6 weeks  PLANNED INTERVENTIONS:  Therapeutic exercises, Therapeutic activity, Neuromuscular re-education, Balance training, Gait training, Patient/Family education, Self Care, Joint mobilization, Joint manipulation, Vestibular training, Canalith repositioning, Orthotic/Fit training, DME instructions, Dry Needling, Electrical stimulation, Spinal manipulation, Spinal mobilization, Cryotherapy, Moist heat, Taping, Traction, Ultrasound, Ionotophoresis 4mg /ml Dexamethasone, Manual therapy, and Re-evaluation.  PLAN FOR NEXT SESSION: hip Abduction and Adduction, Progress Hip and knee strengthening,     Lynnea Maizes PT, DPT, GCS  Lorri Fukuhara, SPT 03/27/2023 1:57 PM

## 2023-04-01 NOTE — Therapy (Signed)
OUTPATIENT PHYSICAL THERAPY KNEE TREATMENT/PROGRESS NOTE  Dates of reporting period  01/18/23   to   04/03/23   Patient Name: Brandy [name] MRN: [id number] DOB:1955-11-11, 67 y.o., female Today's Date: 04/03/2023  END OF SESSION:  PT End of Session - 04/03/23 0801     Visit Number 10    Number of Visits 23    Date for PT Re-Evaluation 05/01/23    Authorization Type eval: 09/05//24, [employer] Medicare 2024  [id number] [place]  $10 copay  no auth req    PT Start Time 0800    PT Stop Time 0845    PT Time Calculation (min) 45 min    Activity Tolerance Patient tolerated treatment well    Behavior During Therapy [facility] for tasks assessed/performed            Past Medical History:  Diagnosis Date   Acute lower GI bleeding 01/19/2020   Arthritis    Atrial fibrillation with rapid ventricular response (HCC)    Chicken pox    Chronic systolic CHF (congestive heart failure) (HCC)    a. 03/2016 Echo: Ef 15-20%, diff HK, ant AK;  b. 05/2016 Echo: Ef 30-35%, diff HK, mildly dil LA/RA.   Diabetes mellitus without complication (HCC)    Dysrhythmia    GERD (gastroesophageal reflux disease)    Heart murmur    Hip discomfort    been going on for 40 years    History of kidney stones    Hyperlipidemia associated with type 2 diabetes mellitus (HCC) 05/12/2021   Hypertension    Moderate mitral regurgitation    a. 03/2016 Echo: mod MR in setting of LV dysfxn.   Motion sickness    car - back seat   NICM (nonischemic cardiomyopathy) (HCC)    a. 03/2016 Echo: EF 15-20%, diff HK, ant AK, mod MR, mod dil LA, mildly dil RA;  b. 04/2016 Cath: nl cors;  c. 05/2016 Echo: EF 30-35%, diff HK.   Obesity    Obstructive sleep apnea    compliant with CPAP   Persistent atrial fibrillation (HCC)    a. Dx 03/2016;  b. CHA2DS2VASc = 2-->Eliquis 5mg  BID;  b. 05/2016 Failed DCCV x 4.   Sleep apnea    Stomach irritation    Visit for monitoring Tikosyn therapy 07/24/2016   Past Surgical History:  Procedure Laterality Date    ATRIAL FIBRILLATION ABLATION N/A 12/09/2020   Procedure: ATRIAL FIBRILLATION ABLATION;  Surgeon: Lanier Prude, MD;  Location: [facility];  Service: Cardiovascular;  Laterality: N/A;   BIOPSY N/A 01/12/2020   Procedure: BIOPSY;  Surgeon: Pasty Spillers, MD;  Location: [facility];  Service: Endoscopy;  Laterality: N/A;   CARDIAC CATHETERIZATION N/A 04/17/2016   Procedure: Left Heart Cath and Coronary Angiography;  Surgeon: Iran Ouch, MD;  Location: [facility] INVASIVE CV LAB;  Service: Cardiovascular;  Laterality: N/A;   CARDIOVERSION N/A 10/22/2020   Procedure: CARDIOVERSION;  Surgeon: Yvonne Kendall, MD;  Location: [facility] ORS;  Service: Cardiovascular;  Laterality: N/A;   CHOLECYSTECTOMY     COLON SURGERY  2021   COLONOSCOPY N/A 01/20/2020   Procedure: COLONOSCOPY;  Surgeon: Regis Bill, MD;  Location: [facility] ENDOSCOPY;  Service: Endoscopy;  Laterality: N/A;   COLONOSCOPY WITH PROPOFOL N/A 01/12/2020   Procedure: COLONOSCOPY WITH PROPOFOL;  Surgeon: Pasty Spillers, MD;  Location: [facility];  Service: Endoscopy;  Laterality: N/A;  Diabetic - oral meds sleep apnea   ELECTROPHYSIOLOGIC STUDY N/A 05/26/2016  Procedure: Cardioversion;  Surgeon: Iran Ouch, MD;  Location: [facility] ORS;  Service: Cardiovascular;  Laterality: N/A;   ESOPHAGOGASTRODUODENOSCOPY (EGD) WITH PROPOFOL N/A 01/12/2020   Procedure: ESOPHAGOGASTRODUODENOSCOPY (EGD) WITH PROPOFOL;  Surgeon: Pasty Spillers, MD;  Location: [facility];  Service: Endoscopy;  Laterality: N/A;   POLYPECTOMY N/A 01/12/2020   Procedure: POLYPECTOMY;  Surgeon: Pasty Spillers, MD;  Location: [facility];  Service: Endoscopy;  Laterality: N/A;   Patient Active Problem List   Diagnosis Date Noted   Peroneal tendinitis, left 10/13/2022   Left lumbar radiculopathy 07/26/2022   Arthralgia of left knee 07/26/2022   Pes anserinus bursitis of right knee 04/19/2022   Primary  osteoarthritis of right knee 03/27/2022   Greater 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 03/27/2022   It band syndrome, right 03/27/2022   Hyperlipidemia associated with type 2 diabetes mellitus (HCC) 05/12/2021   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   Varicose veins of leg with pain, bilateral 07/06/2020   Acquired thrombophilia (HCC) 05/24/2020   Type II diabetes mellitus with complication (HCC) 01/19/2020   Gastric polyp    Colon polyps    Varicose veins of both lower extremities 10/20/2019   Dysphagia 10/20/2019   Osteoarthritis of right hand 10/31/2016   Nonischemic cardiomyopathy (HCC) 04/14/2016   BMI 33.0-33.9,adult 04/14/2016   PCP: Reubin Milan, MD  REFERRING PROVIDER: [name], MD  REFERRING DIAG: [phone] (ICD-10-CM) - Peroneal tendinitis, left   RATIONALE FOR EVALUATION AND TREATMENT: Rehabilitation  THERAPY DIAG: Left Knee Pain  ONSET DATE: Chronic Left Knee Pain   FOLLOW-UP APPT SCHEDULED WITH REFERRING PROVIDER: No    SUBJECTIVE:                                                                                                                                                                                         SUBJECTIVE STATEMENT:  Left knee pain   PERTINENT HISTORY:  Patient reports onset of progressing left knee pain within the last couple months. Patient denies trauma to the knee and has started using a compression sleeve for stability. She has had a history of left knee pain with improvement via physical therapy but rehab for the knee ended early due to pain at the ankle. She endorsed that imaging is significant for minimal space along the joint and she experiences an occasional "catch" or "pop". She denies swelling, bruising or radiating pain below the knee.    PAIN:   Pain Intensity: Present: 0/10, Best: 0/10, Worst: 3/10 Pain location: Left knee  Pain quality:  Catching   Radiating pain: Yes  Swelling: No   Popping, catching, locking:  Yes  Numbness/Tingling: No Focal weakness or buckling: Yes Aggravating factors: Stairway, Prolonged Walking, Prolonged sitting  Relieving factors: Rest, Ice, Compression Sleeve, Massage  24-hour pain behavior: AM: "Wobbly" and "Stiff"  PM: Activity Dependent, more activity or walking aggravates the pain History of prior back, hip, or knee injury, pain, surgery, or therapy: Yes, improvement with PT for chronic knee pain Dominant hand: right Imaging: Yes, "significant for minimal spacing between tibia and femur" Prior level of function: Independent  Occupational demands: Works at [employer] part time; Hobbies: Gardening, Yard work, Reading, [employer] flags: Negative for personal history of cancer, chills/fever, night sweats, nausea, vomiting, unexplained weight gain/loss, unrelenting pain  PRECAUTIONS: None  WEIGHT BEARING RESTRICTIONS: No  FALLS: Has patient fallen in last 6 months? No  Living Environment Lives with: lives alone Lives in: House/apartment  Patient Goals: "I would like for my knee pain to reduce and gain more stability.    OBJECTIVE:   Patient Surveys  FOTO 47, predicted to 74  Cognition Patient is oriented to person, place, and time.  Recent memory is intact.  Remote memory is intact.  Attention span and concentration are intact.  Expressive speech is intact.  Patient's fund of knowledge is within normal limits for educational level.    Gross Musculoskeletal Assessment Tremor: None Bulk: Normal Tone: Normal  GAIT: Deferred   Posture: Deferred  AROM AROM (Normal range in degrees) AROM  Hip Right Left  Flexion (125)    Extension (15)    Abduction (40)    Adduction     Internal Rotation (45)    External Rotation (45)        Knee    Flexion (135)    Extension (0)        Ankle    Dorsiflexion (20)    Plantarflexion (50)    Inversion (35)    Eversion (15    (* = pain; Blank rows = not  tested)  LE MMT: MMT (out of 5) Right Left  Hip flexion 4 4  Hip extension    Hip abduction    Hip adduction    Hip internal rotation 5 4  Hip external rotation 5 4+  Knee flexion 5 5  Knee extension 5 5  Ankle dorsiflexion 5 5  Ankle plantarflexion 5 5  Ankle inversion    Ankle eversion    (* = pain; Blank rows = not tested)  Sensation Grossly intact to light touch bilateral LEs as determined by testing dermatomes L2-S2. Proprioception, and hot/cold testing deferred on this date.  Reflexes (R/L) KJ: 2+/2+  Muscle Length Hamstrings: Deferred Quadriceps Michela Pitcher): Deferred Hip flexors Maisie Fus): Deferred IT band Claiborne Rigg): R: Deferred L: Negative  Palpation  Location LEFT  RIGHT           Quadriceps 1 (lateral aspect)    Medial Hamstrings 0   Lateral Hamstrings 0   Lateral Hamstring tendon 1   Medial Hamstring tendon 1   Quadriceps tendon 0   Patella 0   Patellar Tendon 0   Tibial Tuberosity    Medial joint line 1   Lateral joint line 1   MCL    LCL    Adductor Tubercle    Pes Anserine tendon 1   Infrapatellar fat pad 0   Fibular head 1   Popliteal fossa 0   IT band  2   (Blank rows = not tested) Graded on 0-4 scale (0 = no pain, 1 = pain, 2 =  pain with wincing/grimacing/flinching, 3 = pain with withdrawal, 4 = unwilling to allow palpation), (Blank rows = not tested)  Passive Accessory Motion Tibiofemoral: Deferred  Fibula on Femur: Deferred  Patellofemoral: Superior Glide: R: Negative L: Negative Inferior Glide: R: Negative L: Negative Medial Glide: R: Negative L: Negative Lateral Glide: R: Negative L: Negative  Medial Tilt: R: Negative L: Negative Lateral Tilt: R: Not done L: Positive for discomfort   SPECIAL TESTS  Ligamentous Stability  ACL: Lachman's: R: Negative L: Negative  PCL: Posterior Drawer: R: Negative L: Negative  MCL: Valgus Stress (30 degrees flexion): R: Negative L: Negative  LCL: Varus Stress (30 degrees flexion): R:  Negative L: Negative  Meniscus Tests McMurray's Test: Negative Steinmann Sign I: R: Negative L: Negative  Patellofemoral Pain Syndrome Patellar Tilt (Lateral): R: negative  L: Negative Squatting pain: Deferred Stair climbing pain: Deferred  Patellar Tendinopathy Deferred     TODAY'S TREATMENT:   Subjective: Patient reports to physical therapy with no pain. She reports some R knee hyperextension concerns yesterday but no issues today. She also reports some ankle instability issues. No further questions currently.   Pain: 0/10 resting pain   Therex:  NuStep Level 1-4 x 8 min for warmup and interval history, PT manually adjusted resisted;  Total Gym (TG) Level 22 (L22) double leg squats 2 x 10; TG L22 double leg heel raises x 20; TG L16 single leg squat with dynadisc under foot 2 x 10 BLE;  Rockerboard balance in A/P and R/L orientations eyes open/closed x 30s each; Rockerboard A/P and R/L orientations horizontal and vertical head turns x 30s each; Rockerboard weight shifts A/P and R/L orientations x 30s each; Lateral band walks with Blue tband around thighs x multiple lengths in parallel bars; Airex tandem balance alternating forward lower extremity 2 x 30 seconds each;   PATIENT EDUCATION:  Education details: Pt educated throughout session about proper posture and technique with exercises. Improved exercise technique, movement at target joints, use of target muscles after min to mod verbal, visual, tactile cues. Three systems for balance. Person educated: Patient Education method: Explanation, Demonstration, and Handouts Education comprehension: verbalized understanding   HOME EXERCISE PROGRAM:  Access Code: [email or web address] URL: [email or web address] Date: 02/27/2023 Prepared by: Ria Comment  Exercises - Seated Hamstring Stretch  - 1 x daily - 7 x weekly - 3 sets - 30 hold - Sidelying Quadriceps Stretch  - 1 x daily - 7 x weekly - 3 sets - 10 reps - 30  hold - Supine Piriformis Stretch  - 1 x daily - 7 x weekly - 3 sets - 30 hold - Supine Bridge  - 1 x daily - 7 x weekly - 2-3 sets - 10 reps - 5 hold - Seated Hip Abduction with Resistance  - 1 x daily - 7 x weekly - 2-3 sets - 10 reps - 5 hold - Mini Squat with Counter Support  - 1 x daily - 7 x weekly - 2-3 sets - 10 reps - 5 hold   ASSESSMENT:  CLINICAL IMPRESSION: Patient reported to physical therapy highly motivated to improve LE strength and reduce pain.  Goals were just updated on 03/20/2023 so no need to repeat today.  Since starting with therapy patient has demonstrated improvements in pain, strength and functional movement as indicated in her goals. She is able to perform deeper ranges of squats without pain but still continues to have increased pain with prolonged activities such as gardening, stairs and longer walking distances. Today's  session with a main focus on improving LE strength.  Included additional exercises for ankle stability training.  Anticipated maximum improvement is attainable and reasonable in a generally predictable time. Based on her current limitations and today's performance PT continues to recommend skilled physical therapy 1-2x/week in order to maximize functional gains and return to prior level of function.   OBJECTIVE IMPAIRMENTS: decreased activity tolerance, decreased balance, difficulty walking, decreased strength, and pain.   ACTIVITY LIMITATIONS: bending, sitting, standing, squatting, and stairs  PARTICIPATION LIMITATIONS: shopping, community activity, and yard work  PERSONAL FACTORS: Age, Fitness, Past/current experiences, Time since onset of injury/illness/exacerbation, and 3+ comorbidities: OA, a-fib, OSA, and CHF  are also affecting patient's functional outcome.   REHAB POTENTIAL: Good  CLINICAL DECISION MAKING: Evolving/moderate complexity  EVALUATION COMPLEXITY: Moderate   GOALS: Goals reviewed with patient? Yes  SHORT TERM GOALS: Target  date: 02/15/2023  Pt will be independent with HEP to improve strength and decrease knee pain to improve pain-free function at home and work. Baseline: n/a; 03/20/2023: Endorsed HEP without Verbal Cues Goal status: Goal Met    LONG TERM GOALS: Target date: 05/01/2023 Pt will increase FOTO to at least 58 to demonstrate significant improvement in function at home and work related to knee pain  Baseline: 01/18/23: 47; 03/20/2023: 59 Goal status: Goal Met  2.  Pt will decrease worst knee pain by at least 3 points on the NPRS in order to demonstrate clinically significant reduction in knee pain. Baseline: 01/18/2023: 3/10; 03/20/2023: 1/10 Goal status: Progressing   3.  Pt will increase strength of hip flexion and hip abduction by at least 1/2 MMT grade in order to demonstrate improvement in strength and function  Baseline: 03/20/2023: 4+/5 Hip Flexion, Hip Abduction 4+/5;  Goal status: Goal Met    PLAN: PT FREQUENCY: 1x/week  PT DURATION: 6 weeks  PLANNED INTERVENTIONS: Therapeutic exercises, Therapeutic activity, Neuromuscular re-education, Balance training, Gait training, Patient/Family education, Self Care, Joint mobilization, Joint manipulation, Vestibular training, Canalith repositioning, Orthotic/Fit training, DME instructions, Dry Needling, Electrical stimulation, Spinal manipulation, Spinal mobilization, Cryotherapy, Moist heat, Taping, Traction, Ultrasound, Ionotophoresis 4mg /ml Dexamethasone, Manual therapy, and Re-evaluation.  PLAN FOR NEXT SESSION: Progress hip, knee, and ankle strengthening;    Sharalyn Ink Matther Labell PT, DPT, GCS  04/03/2023 8:53 AM

## 2023-04-03 ENCOUNTER — Ambulatory Visit: Payer: Medicare Other

## 2023-04-03 DIAGNOSIS — M6281 Muscle weakness (generalized): Secondary | ICD-10-CM

## 2023-04-03 DIAGNOSIS — M25562 Pain in left knee: Secondary | ICD-10-CM | POA: Diagnosis not present

## 2023-04-03 DIAGNOSIS — G8929 Other chronic pain: Secondary | ICD-10-CM | POA: Diagnosis not present

## 2023-04-07 NOTE — Therapy (Signed)
OUTPATIENT PHYSICAL THERAPY KNEE TREATMENT  Patient Name: Brandy [name] MRN: [id number] DOB:[birth year], 67 y.o., female Today's Date: 04/11/2023  END OF SESSION:  PT End of Session - 04/11/23 0758     Visit Number 11    Number of Visits 23    Date for PT Re-Evaluation 05/01/23    Authorization Type eval: 09/05//24, [employer] Medicare 2024  [id number] on [place]  $10 copay  no auth req    PT Start Time 0800    PT Stop Time 0845    PT Time Calculation (min) 45 min    Activity Tolerance Patient tolerated treatment well    Behavior During Therapy [facility] for tasks assessed/performed            Past Medical History:  Diagnosis Date   Acute lower GI bleeding 01/19/2020   Arthritis    Atrial fibrillation with rapid ventricular response (HCC)    Chicken pox    Chronic systolic CHF (congestive heart failure) (HCC)    a. 03/2016 Echo: Ef 15-20%, diff HK, ant AK;  b. 05/2016 Echo: Ef 30-35%, diff HK, mildly dil LA/RA.   Diabetes mellitus without complication (HCC)    Dysrhythmia    GERD (gastroesophageal reflux disease)    Heart murmur    Hip discomfort    been going on for 40 years    History of kidney stones    Hyperlipidemia associated with type 2 diabetes mellitus (HCC) 05/12/2021   Hypertension    Moderate mitral regurgitation    a. 03/2016 Echo: mod MR in setting of LV dysfxn.   Motion sickness    car - back seat   NICM (nonischemic cardiomyopathy) (HCC)    a. 03/2016 Echo: EF 15-20%, diff HK, ant AK, mod MR, mod dil LA, mildly dil RA;  b. 04/2016 Cath: nl cors;  c. 05/2016 Echo: EF 30-35%, diff HK.   Obesity    Obstructive sleep apnea    compliant with CPAP   Persistent atrial fibrillation (HCC)    a. Dx 03/2016;  b. CHA2DS2VASc = 2-->Eliquis 5mg  BID;  b. 05/2016 Failed DCCV x 4.   Sleep apnea    Stomach irritation    Visit for monitoring Tikosyn therapy 07/24/2016   Past Surgical History:  Procedure Laterality Date   ATRIAL FIBRILLATION ABLATION N/A 12/09/2020   Procedure: ATRIAL  FIBRILLATION ABLATION;  Surgeon: Lanier Prude, MD;  Location: [facility] INVASIVE CV LAB;  Service: Cardiovascular;  Laterality: N/A;   BIOPSY N/A 01/12/2020   Procedure: BIOPSY;  Surgeon: Pasty Spillers, MD;  Location: [facility];  Service: Endoscopy;  Laterality: N/A;   CARDIAC CATHETERIZATION N/A 04/17/2016   Procedure: Left Heart Cath and Coronary Angiography;  Surgeon: Iran Ouch, MD;  Location: [facility] INVASIVE CV LAB;  Service: Cardiovascular;  Laterality: N/A;   CARDIOVERSION N/A 10/22/2020   Procedure: CARDIOVERSION;  Surgeon: Yvonne Kendall, MD;  Location: [facility] ORS;  Service: Cardiovascular;  Laterality: N/A;   CHOLECYSTECTOMY     COLON SURGERY  2021   COLONOSCOPY N/A 01/20/2020   Procedure: COLONOSCOPY;  Surgeon: Regis Bill, MD;  Location: [facility] ENDOSCOPY;  Service: Endoscopy;  Laterality: N/A;   COLONOSCOPY WITH PROPOFOL N/A 01/12/2020   Procedure: COLONOSCOPY WITH PROPOFOL;  Surgeon: Pasty Spillers, MD;  Location: [facility];  Service: Endoscopy;  Laterality: N/A;  Diabetic - oral meds sleep apnea   ELECTROPHYSIOLOGIC STUDY N/A 05/26/2016   Procedure: Cardioversion;  Surgeon: Iran Ouch, MD;  Location: [facility] ORS;  Service: Cardiovascular;  Laterality: N/A;   ESOPHAGOGASTRODUODENOSCOPY (EGD) WITH PROPOFOL N/A 01/12/2020   Procedure: ESOPHAGOGASTRODUODENOSCOPY (EGD) WITH PROPOFOL;  Surgeon: Pasty Spillers, MD;  Location: [facility];  Service: Endoscopy;  Laterality: N/A;   POLYPECTOMY N/A 01/12/2020   Procedure: POLYPECTOMY;  Surgeon: Pasty Spillers, MD;  Location: [facility];  Service: Endoscopy;  Laterality: N/A;   Patient Active Problem List   Diagnosis Date Noted   Peroneal tendinitis, left 10/13/2022   Left lumbar radiculopathy 07/26/2022   Arthralgia of left knee 07/26/2022   Pes anserinus bursitis of right knee 04/19/2022   Primary osteoarthritis of right knee 03/27/2022   Greater trochanteric pain  syndrome of right lower extremity 03/27/2022   It band syndrome, right 03/27/2022   Hyperlipidemia associated with type 2 diabetes mellitus (HCC) 05/12/2021   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   Varicose veins of leg with pain, bilateral 07/06/2020   Acquired thrombophilia (HCC) 05/24/2020   Type II diabetes mellitus with complication (HCC) 01/19/2020   Gastric polyp    Colon polyps    Varicose veins of both lower extremities 10/20/2019   Dysphagia 10/20/2019   Osteoarthritis of right hand 10/31/2016   Nonischemic cardiomyopathy (HCC) 04/14/2016   BMI 33.0-33.9,adult 04/14/2016   PCP: Reubin Milan, MD  REFERRING PROVIDER: [name], MD  REFERRING DIAG: [phone] (ICD-10-CM) - Peroneal tendinitis, left   RATIONALE FOR EVALUATION AND TREATMENT: Rehabilitation  THERAPY DIAG: Left Knee Pain  ONSET DATE: Chronic Left Knee Pain   FOLLOW-UP APPT SCHEDULED WITH REFERRING PROVIDER: No    SUBJECTIVE:                                                                                                                                                                                         SUBJECTIVE STATEMENT:  Left knee pain   PERTINENT HISTORY:  Patient reports onset of progressing left knee pain within the last couple months. Patient denies trauma to the knee and has started using a compression sleeve for stability. She has had a history of left knee pain with improvement via physical therapy but rehab for the knee ended early due to pain at the ankle. She endorsed that imaging is significant for minimal space along the joint and she experiences an occasional "catch" or "pop". She denies swelling, bruising or radiating pain below the knee.    PAIN:   Pain Intensity: Present: 0/10, Best: 0/10, Worst: 3/10 Pain location: Left knee  Pain quality:  Catching   Radiating pain: Yes  Swelling: No  Popping, catching, locking: Yes  Numbness/Tingling: No Focal weakness  or buckling: Yes Aggravating factors: Stairway, Prolonged Walking, Prolonged sitting  Relieving factors: Rest, Ice, Compression Sleeve, Massage  24-hour pain behavior: AM: "Wobbly" and "Stiff"  PM: Activity Dependent, more activity or walking aggravates the pain History of prior back, hip, or knee injury, pain, surgery, or therapy: Yes, improvement with PT for chronic knee pain Dominant hand: right Imaging: Yes, "significant for minimal spacing between tibia and femur" Prior level of function: Independent  Occupational demands: Works at [employer] part time; Hobbies: Gardening, Yard work, Reading, [employer] flags: Negative for personal history of cancer, chills/fever, night sweats, nausea, vomiting, unexplained weight gain/loss, unrelenting pain  PRECAUTIONS: None  WEIGHT BEARING RESTRICTIONS: No  FALLS: Has patient fallen in last 6 months? No  Living Environment Lives with: lives alone Lives in: House/apartment  Patient Goals: "I would like for my knee pain to reduce and gain more stability.    OBJECTIVE:   Patient Surveys  FOTO 47, predicted to 95  Cognition Patient is oriented to person, place, and time.  Recent memory is intact.  Remote memory is intact.  Attention span and concentration are intact.  Expressive speech is intact.  Patient's fund of knowledge is within normal limits for educational level.    Gross Musculoskeletal Assessment Tremor: None Bulk: Normal Tone: Normal  GAIT: Deferred   Posture: Deferred  AROM AROM (Normal range in degrees) AROM  Hip Right Left  Flexion (125)    Extension (15)    Abduction (40)    Adduction     Internal Rotation (45)    External Rotation (45)        Knee    Flexion (135)    Extension (0)        Ankle    Dorsiflexion (20)    Plantarflexion (50)    Inversion (35)    Eversion (15    (* = pain; Blank rows = not tested)  LE MMT: MMT (out of 5) Right Left  Hip flexion 4 4  Hip extension     Hip abduction    Hip adduction    Hip internal rotation 5 4  Hip external rotation 5 4+  Knee flexion 5 5  Knee extension 5 5  Ankle dorsiflexion 5 5  Ankle plantarflexion 5 5  Ankle inversion    Ankle eversion    (* = pain; Blank rows = not tested)  Sensation Grossly intact to light touch bilateral LEs as determined by testing dermatomes L2-S2. Proprioception, and hot/cold testing deferred on this date.  Reflexes (R/L) KJ: 2+/2+  Muscle Length Hamstrings: Deferred Quadriceps Michela Pitcher): Deferred Hip flexors Maisie Fus): Deferred IT band Claiborne Rigg): R: Deferred L: Negative  Palpation  Location LEFT  RIGHT           Quadriceps 1 (lateral aspect)    Medial Hamstrings 0   Lateral Hamstrings 0   Lateral Hamstring tendon 1   Medial Hamstring tendon 1   Quadriceps tendon 0   Patella 0   Patellar Tendon 0   Tibial Tuberosity    Medial joint line 1   Lateral joint line 1   MCL    LCL    Adductor Tubercle    Pes Anserine tendon 1   Infrapatellar fat pad 0   Fibular head 1   Popliteal fossa 0   IT band  2   (Blank rows = not tested) Graded on 0-4 scale (0 = no pain, 1 = pain, 2 = pain with wincing/grimacing/flinching, 3 = pain with withdrawal, 4 = unwilling to allow palpation), (Blank rows =  not tested)  Passive Accessory Motion Tibiofemoral: Deferred  Fibula on Femur: Deferred  Patellofemoral: Superior Glide: R: Negative L: Negative Inferior Glide: R: Negative L: Negative Medial Glide: R: Negative L: Negative Lateral Glide: R: Negative L: Negative  Medial Tilt: R: Negative L: Negative Lateral Tilt: R: Not done L: Positive for discomfort   SPECIAL TESTS  Ligamentous Stability  ACL: Lachman's: R: Negative L: Negative  PCL: Posterior Drawer: R: Negative L: Negative  MCL: Valgus Stress (30 degrees flexion): R: Negative L: Negative  LCL: Varus Stress (30 degrees flexion): R: Negative L: Negative  Meniscus Tests McMurray's Test: Negative Steinmann Sign I: R:  Negative L: Negative  Patellofemoral Pain Syndrome Patellar Tilt (Lateral): R: negative  L: Negative Squatting pain: Deferred Stair climbing pain: Deferred  Patellar Tendinopathy Deferred     TODAY'S TREATMENT:   Subjective: Patient reports to physical therapy with no pain but generalized stiffness. No further questions currently.   Pain: generalized "stiffness" but no pain   Therex:  NuStep Level 2-4 x 8 min for warmup and interval history, PT manually adjusted resisted;  Forward BOSU lunges with BUE support x 10 BLE; Rockerboard balance in A/P and R/L orientations eyes open x 60s each direction; Rockerboard weight shifts A/P and R/L orientations x 60s each; Lateral band walks with blue tband around thighs x multiple lengths in parallel bars; Heel raises x 10 BLE;  Standing hip strengthening with 4# ankle weights: Hip flexion marches x 10 BLE; HS curls x 10 BLE; Hip abduction x 10 BLE; Hip extension x 10 BLE;  Seated LAQ with 4# ankle weights x 10 BLE;    Not performed: Total Gym (TG) Level 22 (L22) double leg squats 2 x 10; TG L22 double leg heel raises x 20; TG L16 single leg squat with dynadisc under foot 2 x 10 BLE;  Airex tandem balance alternating forward lower extremity 2 x 30 seconds each;   PATIENT EDUCATION:  Education details: Pt educated throughout session about proper posture and technique with exercises. Improved exercise technique, movement at target joints, use of target muscles after min to mod verbal, visual, tactile cues. Three systems for balance. Person educated: Patient Education method: Explanation, Demonstration, and Handouts Education comprehension: verbalized understanding   HOME EXERCISE PROGRAM:  Access Code: [email or web address] URL: [email or web address] Date: 02/27/2023 Prepared by: Ria Comment  Exercises - Seated Hamstring Stretch  - 1 x daily - 7 x weekly - 3 sets - 30 hold - Sidelying Quadriceps Stretch  - 1 x daily - 7  x weekly - 3 sets - 10 reps - 30 hold - Supine Piriformis Stretch  - 1 x daily - 7 x weekly - 3 sets - 30 hold - Supine Bridge  - 1 x daily - 7 x weekly - 2-3 sets - 10 reps - 5 hold - Seated Hip Abduction with Resistance  - 1 x daily - 7 x weekly - 2-3 sets - 10 reps - 5 hold - Mini Squat with Counter Support  - 1 x daily - 7 x weekly - 2-3 sets - 10 reps - 5 hold   ASSESSMENT:  CLINICAL IMPRESSION: Patient reported to physical therapy highly motivated to improve LE strength and reduce pain. Today's session with a main focus on improving LE strength. Repeated additional exercises for ankle stability training.  Anticipated maximum improvement is attainable and reasonable in a generally predictable time. Based on her current limitations and today's performance PT continues to recommend skilled physical therapy 1-2x/week in order  to maximize functional gains and return to prior level of function.   OBJECTIVE IMPAIRMENTS: decreased activity tolerance, decreased balance, difficulty walking, decreased strength, and pain.   ACTIVITY LIMITATIONS: bending, sitting, standing, squatting, and stairs  PARTICIPATION LIMITATIONS: shopping, community activity, and yard work  PERSONAL FACTORS: Age, Fitness, Past/current experiences, Time since onset of injury/illness/exacerbation, and 3+ comorbidities: OA, a-fib, OSA, and CHF  are also affecting patient's functional outcome.   REHAB POTENTIAL: Good  CLINICAL DECISION MAKING: Evolving/moderate complexity  EVALUATION COMPLEXITY: Moderate   GOALS: Goals reviewed with patient? Yes  SHORT TERM GOALS: Target date: 02/15/2023  Pt will be independent with HEP to improve strength and decrease knee pain to improve pain-free function at home and work. Baseline: n/a; 03/20/2023: Endorsed HEP without Verbal Cues Goal status: Goal Met    LONG TERM GOALS: Target date: 05/01/2023 Pt will increase FOTO to at least 58 to demonstrate significant improvement in  function at home and work related to knee pain  Baseline: 01/18/23: 47; 03/20/2023: 59 Goal status: Goal Met  2.  Pt will decrease worst knee pain by at least 3 points on the NPRS in order to demonstrate clinically significant reduction in knee pain. Baseline: 01/18/2023: 3/10; 03/20/2023: 1/10 Goal status: Progressing   3.  Pt will increase strength of hip flexion and hip abduction by at least 1/2 MMT grade in order to demonstrate improvement in strength and function  Baseline: 03/20/2023: 4+/5 Hip Flexion, Hip Abduction 4+/5;  Goal status: Goal Met    PLAN: PT FREQUENCY: 1x/week  PT DURATION: 6 weeks  PLANNED INTERVENTIONS: Therapeutic exercises, Therapeutic activity, Neuromuscular re-education, Balance training, Gait training, Patient/Family education, Self Care, Joint mobilization, Joint manipulation, Vestibular training, Canalith repositioning, Orthotic/Fit training, DME instructions, Dry Needling, Electrical stimulation, Spinal manipulation, Spinal mobilization, Cryotherapy, Moist heat, Taping, Traction, Ultrasound, Ionotophoresis 4mg /ml Dexamethasone, Manual therapy, and Re-evaluation.  PLAN FOR NEXT SESSION: Progress hip, knee, and ankle strengthening;    Sharalyn Ink Brandy [name] PT, DPT, GCS  04/11/2023 9:25 AM

## 2023-04-11 ENCOUNTER — Ambulatory Visit: Payer: Medicare Other

## 2023-04-11 DIAGNOSIS — G8929 Other chronic pain: Secondary | ICD-10-CM

## 2023-04-11 DIAGNOSIS — M6281 Muscle weakness (generalized): Secondary | ICD-10-CM

## 2023-04-11 DIAGNOSIS — M25562 Pain in left knee: Secondary | ICD-10-CM | POA: Diagnosis not present

## 2023-04-15 NOTE — Therapy (Signed)
OUTPATIENT PHYSICAL THERAPY KNEE TREATMENT  Patient Name: Brandy [name] MRN: [id number] DOB:January 31, 1956, 67 y.o., female Today's Date: 04/17/2023  END OF SESSION:  PT End of Session - 04/17/23 0849     Visit Number 12    Number of Visits 23    Date for PT Re-Evaluation 05/01/23    Authorization Type eval: 09/05//24, [employer] Medicare 2024  [id number] on [place]  $10 copay  no auth req    PT Start Time 0849    PT Stop Time 0934    PT Time Calculation (min) 45 min    Activity Tolerance Patient tolerated treatment well    Behavior During Therapy [facility] for tasks assessed/performed            Past Medical History:  Diagnosis Date   Acute lower GI bleeding 01/19/2020   Arthritis    Atrial fibrillation with rapid ventricular response (HCC)    Chicken pox    Chronic systolic CHF (congestive heart failure) (HCC)    a. 03/2016 Echo: Ef 15-20%, diff HK, ant AK;  b. 05/2016 Echo: Ef 30-35%, diff HK, mildly dil LA/RA.   Diabetes mellitus without complication (HCC)    Dysrhythmia    GERD (gastroesophageal reflux disease)    Heart murmur    Hip discomfort    been going on for 40 years    History of kidney stones    Hyperlipidemia associated with type 2 diabetes mellitus (HCC) 05/12/2021   Hypertension    Moderate mitral regurgitation    a. 03/2016 Echo: mod MR in setting of LV dysfxn.   Motion sickness    car - back seat   NICM (nonischemic cardiomyopathy) (HCC)    a. 03/2016 Echo: EF 15-20%, diff HK, ant AK, mod MR, mod dil LA, mildly dil RA;  b. 04/2016 Cath: nl cors;  c. 05/2016 Echo: EF 30-35%, diff HK.   Obesity    Obstructive sleep apnea    compliant with CPAP   Persistent atrial fibrillation (HCC)    a. Dx 03/2016;  b. CHA2DS2VASc = 2-->Eliquis 5mg  BID;  b. 05/2016 Failed DCCV x 4.   Sleep apnea    Stomach irritation    Visit for monitoring Tikosyn therapy 07/24/2016   Past Surgical History:  Procedure Laterality Date   ATRIAL FIBRILLATION ABLATION N/A 12/09/2020   Procedure: ATRIAL  FIBRILLATION ABLATION;  Surgeon: Lanier Prude, MD;  Location: [facility];  Service: Cardiovascular;  Laterality: N/A;   BIOPSY N/A 01/12/2020   Procedure: BIOPSY;  Surgeon: Pasty Spillers, MD;  Location: [facility];  Service: Endoscopy;  Laterality: N/A;   CARDIAC CATHETERIZATION N/A 04/17/2016   Procedure: Left Heart Cath and Coronary Angiography;  Surgeon: Iran Ouch, MD;  Location: [facility] INVASIVE CV LAB;  Service: Cardiovascular;  Laterality: N/A;   CARDIOVERSION N/A 10/22/2020   Procedure: CARDIOVERSION;  Surgeon: Yvonne Kendall, MD;  Location: [facility] ORS;  Service: Cardiovascular;  Laterality: N/A;   CHOLECYSTECTOMY     COLON SURGERY  2021   COLONOSCOPY N/A 01/20/2020   Procedure: COLONOSCOPY;  Surgeon: Regis Bill, MD;  Location: [facility] ENDOSCOPY;  Service: Endoscopy;  Laterality: N/A;   COLONOSCOPY WITH PROPOFOL N/A 01/12/2020   Procedure: COLONOSCOPY WITH PROPOFOL;  Surgeon: Pasty Spillers, MD;  Location: [facility];  Service: Endoscopy;  Laterality: N/A;  Diabetic - oral meds sleep apnea   ELECTROPHYSIOLOGIC STUDY N/A 05/26/2016   Procedure: Cardioversion;  Surgeon: Iran Ouch, MD;  Location: [facility] ORS;  Service: Cardiovascular;  Laterality: N/A;   ESOPHAGOGASTRODUODENOSCOPY (EGD) WITH PROPOFOL N/A 01/12/2020   Procedure: ESOPHAGOGASTRODUODENOSCOPY (EGD) WITH PROPOFOL;  Surgeon: Pasty Spillers, MD;  Location: [facility];  Service: Endoscopy;  Laterality: N/A;   POLYPECTOMY N/A 01/12/2020   Procedure: POLYPECTOMY;  Surgeon: Pasty Spillers, MD;  Location: [facility];  Service: Endoscopy;  Laterality: N/A;   Patient Active Problem List   Diagnosis Date Noted   Peroneal tendinitis, left 10/13/2022   Left lumbar radiculopathy 07/26/2022   Arthralgia of left knee 07/26/2022   Pes anserinus bursitis of right knee 04/19/2022   Primary osteoarthritis of right knee 03/27/2022   Greater trochanteric pain  syndrome of right lower extremity 03/27/2022   It band syndrome, right 03/27/2022   Hyperlipidemia associated with type 2 diabetes mellitus (HCC) 05/12/2021   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   Varicose veins of leg with pain, bilateral 07/06/2020   Acquired thrombophilia (HCC) 05/24/2020   Type II diabetes mellitus with complication (HCC) 01/19/2020   Gastric polyp    Colon polyps    Varicose veins of both lower extremities 10/20/2019   Dysphagia 10/20/2019   Osteoarthritis of right hand 10/31/2016   Nonischemic cardiomyopathy (HCC) 04/14/2016   BMI 33.0-33.9,adult 04/14/2016   PCP: Reubin Milan, MD  REFERRING PROVIDER: [name], MD  REFERRING DIAG: [phone] (ICD-10-CM) - Peroneal tendinitis, left   RATIONALE FOR EVALUATION AND TREATMENT: Rehabilitation  THERAPY DIAG: Left Knee Pain  ONSET DATE: Chronic Left Knee Pain   FOLLOW-UP APPT SCHEDULED WITH REFERRING PROVIDER: No    SUBJECTIVE:                                                                                                                                                                                         SUBJECTIVE STATEMENT:  Left knee pain   PERTINENT HISTORY:  Patient reports onset of progressing left knee pain within the last couple months. Patient denies trauma to the knee and has started using a compression sleeve for stability. She has had a history of left knee pain with improvement via physical therapy but rehab for the knee ended early due to pain at the ankle. She endorsed that imaging is significant for minimal space along the joint and she experiences an occasional "catch" or "pop". She denies swelling, bruising or radiating pain below the knee.    PAIN:   Pain Intensity: Present: 0/10, Best: 0/10, Worst: 3/10 Pain location: Left knee  Pain quality:  Catching   Radiating pain: Yes  Swelling: No  Popping, catching, locking: Yes  Numbness/Tingling: No Focal weakness  or buckling: Yes Aggravating factors: Stairway, Prolonged Walking, Prolonged sitting  Relieving factors: Rest, Ice, Compression Sleeve, Massage  24-hour pain behavior: AM: "Wobbly" and "Stiff"  PM: Activity Dependent, more activity or walking aggravates the pain History of prior back, hip, or knee injury, pain, surgery, or therapy: Yes, improvement with PT for chronic knee pain Dominant hand: right Imaging: Yes, "significant for minimal spacing between tibia and femur" Prior level of function: Independent  Occupational demands: Works at [employer] part time; Hobbies: Gardening, Yard work, Reading, [employer] flags: Negative for personal history of cancer, chills/fever, night sweats, nausea, vomiting, unexplained weight gain/loss, unrelenting pain  PRECAUTIONS: None  WEIGHT BEARING RESTRICTIONS: No  FALLS: Has patient fallen in last 6 months? No  Living Environment Lives with: lives alone Lives in: House/apartment  Patient Goals: "I would like for my knee pain to reduce and gain more stability.    OBJECTIVE:   Patient Surveys  FOTO 47, predicted to 37  Cognition Patient is oriented to person, place, and time.  Recent memory is intact.  Remote memory is intact.  Attention span and concentration are intact.  Expressive speech is intact.  Patient's fund of knowledge is within normal limits for educational level.    Gross Musculoskeletal Assessment Tremor: None Bulk: Normal Tone: Normal  GAIT: Deferred   Posture: Deferred  AROM AROM (Normal range in degrees) AROM  Hip Right Left  Flexion (125)    Extension (15)    Abduction (40)    Adduction     Internal Rotation (45)    External Rotation (45)        Knee    Flexion (135)    Extension (0)        Ankle    Dorsiflexion (20)    Plantarflexion (50)    Inversion (35)    Eversion (15    (* = pain; Blank rows = not tested)  LE MMT: MMT (out of 5) Right Left  Hip flexion 4 4  Hip extension     Hip abduction    Hip adduction    Hip internal rotation 5 4  Hip external rotation 5 4+  Knee flexion 5 5  Knee extension 5 5  Ankle dorsiflexion 5 5  Ankle plantarflexion 5 5  Ankle inversion    Ankle eversion    (* = pain; Blank rows = not tested)  Sensation Grossly intact to light touch bilateral LEs as determined by testing dermatomes L2-S2. Proprioception, and hot/cold testing deferred on this date.  Reflexes (R/L) KJ: 2+/2+  Muscle Length Hamstrings: Deferred Quadriceps Michela Pitcher): Deferred Hip flexors Maisie Fus): Deferred IT band Claiborne Rigg): R: Deferred L: Negative  Palpation  Location LEFT  RIGHT           Quadriceps 1 (lateral aspect)    Medial Hamstrings 0   Lateral Hamstrings 0   Lateral Hamstring tendon 1   Medial Hamstring tendon 1   Quadriceps tendon 0   Patella 0   Patellar Tendon 0   Tibial Tuberosity    Medial joint line 1   Lateral joint line 1   MCL    LCL    Adductor Tubercle    Pes Anserine tendon 1   Infrapatellar fat pad 0   Fibular head 1   Popliteal fossa 0   IT band  2   (Blank rows = not tested) Graded on 0-4 scale (0 = no pain, 1 = pain, 2 = pain with wincing/grimacing/flinching, 3 = pain with withdrawal, 4 = unwilling to allow palpation), (Blank rows =  not tested)  Passive Accessory Motion Tibiofemoral: Deferred  Fibula on Femur: Deferred  Patellofemoral: Superior Glide: R: Negative L: Negative Inferior Glide: R: Negative L: Negative Medial Glide: R: Negative L: Negative Lateral Glide: R: Negative L: Negative  Medial Tilt: R: Negative L: Negative Lateral Tilt: R: Not done L: Positive for discomfort  SPECIAL TESTS  Ligamentous Stability  ACL: Lachman's: R: Negative L: Negative  PCL: Posterior Drawer: R: Negative L: Negative  MCL: Valgus Stress (30 degrees flexion): R: Negative L: Negative  LCL: Varus Stress (30 degrees flexion): R: Negative L: Negative  Meniscus Tests McMurray's Test: Negative Steinmann Sign I: R:  Negative L: Negative  Patellofemoral Pain Syndrome Patellar Tilt (Lateral): R: negative  L: Negative Squatting pain: Deferred Stair climbing pain: Deferred  Patellar Tendinopathy Deferred     TODAY'S TREATMENT:   SUBJECTIVE: Patient reports to physical therapy with no pain but generalized stiffness especially in the knees. No further questions currently.   PAIN: generalized "stiffness" in knees but no pain   Therex:  NuStep Level 1-4 x 8 min for warmup and interval history, PT manually adjusted resisted;  Total Gym (TG) Level 22 (L22) double leg squats 2 x 15; TG L22 single leg squat 2 x 8 BLE;  TG L22 double leg heel raises 2 x 20;  Standing hip strengthening with 5# ankle weights: Hip flexion marches x 15 BLE; HS curls x 15 BLE; Hip abduction x 15 BLE; Hip extension x 15 BLE;  Seated LAQ with 5# ankle weights x 15 BLE; Airex tandem balance alternating forward lower extremity x 30 seconds each; Forward BOSU lunges with BUE support x 10 BLE;   Not performed: Rockerboard balance in A/P and R/L orientations eyes open x 60s each direction; Rockerboard weight shifts A/P and R/L orientations x 60s each; Lateral band walks with blue tband around thighs x multiple lengths in parallel bars;   PATIENT EDUCATION:  Education details: Pt educated throughout session about proper posture and technique with exercises. Improved exercise technique, movement at target joints, use of target muscles after min to mod verbal, visual, tactile cues. Three systems for balance. Person educated: Patient Education method: Explanation, Demonstration, and Handouts Education comprehension: verbalized understanding   HOME EXERCISE PROGRAM:  Access Code: [email or web address] URL: [email or web address] Date: 02/27/2023 Prepared by: Ria Comment  Exercises - Seated Hamstring Stretch  - 1 x daily - 7 x weekly - 3 sets - 30 hold - Sidelying Quadriceps Stretch  - 1 x daily - 7 x weekly - 3  sets - 10 reps - 30 hold - Supine Piriformis Stretch  - 1 x daily - 7 x weekly - 3 sets - 30 hold - Supine Bridge  - 1 x daily - 7 x weekly - 2-3 sets - 10 reps - 5 hold - Seated Hip Abduction with Resistance  - 1 x daily - 7 x weekly - 2-3 sets - 10 reps - 5 hold - Mini Squat with Counter Support  - 1 x daily - 7 x weekly - 2-3 sets - 10 reps - 5 hold   ASSESSMENT:  CLINICAL IMPRESSION: Patient reported to physical therapy highly motivated to improve LE strength and reduce pain. Today's session with a main focus on improving LE strength. Anticipated maximum improvement is attainable and reasonable in a generally predictable time. Based on her current limitations and today's performance PT continues to recommend skilled physical therapy 1-2x/week in order to maximize functional gains and return to prior level of function.  OBJECTIVE IMPAIRMENTS: decreased activity tolerance, decreased balance, difficulty walking, decreased strength, and pain.   ACTIVITY LIMITATIONS: bending, sitting, standing, squatting, and stairs  PARTICIPATION LIMITATIONS: shopping, community activity, and yard work  PERSONAL FACTORS: Age, Fitness, Past/current experiences, Time since onset of injury/illness/exacerbation, and 3+ comorbidities: OA, a-fib, OSA, and CHF  are also affecting patient's functional outcome.   REHAB POTENTIAL: Good  CLINICAL DECISION MAKING: Evolving/moderate complexity  EVALUATION COMPLEXITY: Moderate   GOALS: Goals reviewed with patient? Yes  SHORT TERM GOALS: Target date: 02/15/2023  Pt will be independent with HEP to improve strength and decrease knee pain to improve pain-free function at home and work. Baseline: n/a; 03/20/2023: Endorsed HEP without Verbal Cues Goal status: Goal Met    LONG TERM GOALS: Target date: 05/01/2023 Pt will increase FOTO to at least 58 to demonstrate significant improvement in function at home and work related to knee pain  Baseline: 01/18/23: 47;  03/20/2023: 59 Goal status: Goal Met  2.  Pt will decrease worst knee pain by at least 3 points on the NPRS in order to demonstrate clinically significant reduction in knee pain. Baseline: 01/18/2023: 3/10; 03/20/2023: 1/10 Goal status: Progressing   3.  Pt will increase strength of hip flexion and hip abduction by at least 1/2 MMT grade in order to demonstrate improvement in strength and function  Baseline: 03/20/2023: 4+/5 Hip Flexion, Hip Abduction 4+/5;  Goal status: Goal Met    PLAN: PT FREQUENCY: 1x/week  PT DURATION: 6 weeks  PLANNED INTERVENTIONS: Therapeutic exercises, Therapeutic activity, Neuromuscular re-education, Balance training, Gait training, Patient/Family education, Self Care, Joint mobilization, Joint manipulation, Vestibular training, Canalith repositioning, Orthotic/Fit training, DME instructions, Dry Needling, Electrical stimulation, Spinal manipulation, Spinal mobilization, Cryotherapy, Moist heat, Taping, Traction, Ultrasound, Ionotophoresis 4mg /ml Dexamethasone, Manual therapy, and Re-evaluation.  PLAN FOR NEXT SESSION: Progress hip, knee, and ankle strengthening;    Sharalyn Ink Italia Wolfert PT, DPT, GCS  04/17/2023 10:01 AM

## 2023-04-17 ENCOUNTER — Ambulatory Visit: Payer: Medicare Other | Attending: Family Medicine

## 2023-04-17 DIAGNOSIS — M6281 Muscle weakness (generalized): Secondary | ICD-10-CM | POA: Diagnosis not present

## 2023-04-17 DIAGNOSIS — M25562 Pain in left knee: Secondary | ICD-10-CM | POA: Diagnosis not present

## 2023-04-17 DIAGNOSIS — G8929 Other chronic pain: Secondary | ICD-10-CM | POA: Insufficient documentation

## 2023-04-20 NOTE — Progress Notes (Signed)
Care[facility] Summary Report / Loop Recorder 

## 2023-04-23 NOTE — Therapy (Signed)
OUTPATIENT PHYSICAL THERAPY KNEE TREATMENT  Patient Name: Brandy [name] MRN: [id number] DOB:July 25, 1955, 67 y.o., female Today's Date: 04/23/2023  END OF SESSION:   Past Medical History:  Diagnosis Date   Acute lower GI bleeding 01/19/2020   Arthritis    Atrial fibrillation with rapid ventricular response (HCC)    Chicken pox    Chronic systolic CHF (congestive heart failure) (HCC)    a. 03/2016 Echo: Ef 15-20%, diff HK, ant AK;  b. 05/2016 Echo: Ef 30-35%, diff HK, mildly dil LA/RA.   Diabetes mellitus without complication (HCC)    Dysrhythmia    GERD (gastroesophageal reflux disease)    Heart murmur    Hip discomfort    been going on for 40 years    History of kidney stones    Hyperlipidemia associated with type 2 diabetes mellitus (HCC) 05/12/2021   Hypertension    Moderate mitral regurgitation    a. 03/2016 Echo: mod MR in setting of LV dysfxn.   Motion sickness    car - back seat   NICM (nonischemic cardiomyopathy) (HCC)    a. 03/2016 Echo: EF 15-20%, diff HK, ant AK, mod MR, mod dil LA, mildly dil RA;  b. 04/2016 Cath: nl cors;  c. 05/2016 Echo: EF 30-35%, diff HK.   Obesity    Obstructive sleep apnea    compliant with CPAP   Persistent atrial fibrillation (HCC)    a. Dx 03/2016;  b. CHA2DS2VASc = 2-->Eliquis 5mg  BID;  b. 05/2016 Failed DCCV x 4.   Sleep apnea    Stomach irritation    Visit for monitoring Tikosyn therapy 07/24/2016   Past Surgical History:  Procedure Laterality Date   ATRIAL FIBRILLATION ABLATION N/A 12/09/2020   Procedure: ATRIAL FIBRILLATION ABLATION;  Surgeon: Lanier Prude, MD;  Location: [facility];  Service: Cardiovascular;  Laterality: N/A;   BIOPSY N/A 01/12/2020   Procedure: BIOPSY;  Surgeon: Pasty Spillers, MD;  Location: [facility];  Service: Endoscopy;  Laterality: N/A;   CARDIAC CATHETERIZATION N/A 04/17/2016   Procedure: Left Heart Cath and Coronary Angiography;  Surgeon: Iran Ouch, MD;  Location: [facility]  INVASIVE CV LAB;  Service: Cardiovascular;  Laterality: N/A;   CARDIOVERSION N/A 10/22/2020   Procedure: CARDIOVERSION;  Surgeon: Yvonne Kendall, MD;  Location: [facility] OR[facility];  Service: Cardiovascular;  Laterality: N/A;   CHOLECYSTECTOMY     COLON SURGERY  2021   COLONOSCOPY N/A 01/20/2020   Procedure: COLONOSCOPY;  Surgeon: Regis Bill, MD;  Location: [facility] END[facility];  Service: Endoscopy;  Laterality: N/A;   COLONOSCOPY WITH PROPOFOL N/A 01/12/2020   Procedure: COLONOSCOPY WITH PROPOFOL;  Surgeon: Pasty Spillers, MD;  Location: [facility];  Service: Endoscopy;  Laterality: N/A;  Diabetic - oral meds sleep apnea   ELECTROPHYSIOLOGIC STUDY N/A 05/26/2016   Procedure: Cardioversion;  Surgeon: Iran Ouch, MD;  Location: [facility] OR[facility];  Service: Cardiovascular;  Laterality: N/A;   ESOPHAGOGASTRODUODENOSCOPY (EGD) WITH PROPOFOL N/A 01/12/2020   Procedure: ESOPHAGOGASTRODUODENOSCOPY (EGD) WITH PROPOFOL;  Surgeon: Pasty Spillers, MD;  Location: [facility];  Service: Endoscopy;  Laterality: N/A;   POLYPECTOMY N/A 01/12/2020   Procedure: POLYPECTOMY;  Surgeon: Pasty Spillers, MD;  Location: [facility];  Service: Endoscopy;  Laterality: N/A;   Patient Active Problem List   Diagnosis Date Noted   Peroneal tendinitis, left 10/13/2022   Left lumbar radiculopathy 07/26/2022   Arthralgia of left knee 07/26/2022   Pes anserinus bursitis of right knee 04/19/2022  Primary osteoarthritis of right knee 03/27/2022   Greater 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 03/27/2022   It band syndrome, right 03/27/2022   Hyperlipidemia associated with type 2 diabetes mellitus (HCC) 05/12/2021   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   Varicose veins of leg with pain, bilateral 07/06/2020   Acquired thrombophilia (HCC) 05/24/2020   Type II diabetes mellitus with complication (HCC) 01/19/2020   Gastric polyp    Colon polyps    Varicose veins of  both lower extremities 10/20/2019   Dysphagia 10/20/2019   Osteoarthritis of right hand 10/31/2016   Nonischemic cardiomyopathy (HCC) 04/14/2016   BMI 33.0-33.9,adult 04/14/2016   PCP: Reubin Milan, MD  REFERRING PROVIDER: [name], MD  REFERRING DIAG: [phone] (ICD-10-CM) - Peroneal tendinitis, left   RATIONALE FOR EVALUATION AND TREATMENT: Rehabilitation  THERAPY DIAG: Left Knee Pain  ONSET DATE: Chronic Left Knee Pain   FOLLOW-UP APPT SCHEDULED WITH REFERRING PROVIDER: No    SUBJECTIVE:                                                                                                                                                                                         SUBJECTIVE STATEMENT:  Left knee pain   PERTINENT HISTORY:  Patient reports onset of progressing left knee pain within the last couple months. Patient denies trauma to the knee and has started using a compression sleeve for stability. She has had a history of left knee pain with improvement via physical therapy but rehab for the knee ended early due to pain at the ankle. She endorsed that imaging is significant for minimal space along the joint and she experiences an occasional "catch" or "pop". She denies swelling, bruising or radiating pain below the knee.    PAIN:   Pain Intensity: Present: 0/10, Best: 0/10, Worst: 3/10 Pain location: Left knee  Pain quality:  Catching   Radiating pain: Yes  Swelling: No  Popping, catching, locking: Yes  Numbness/Tingling: No Focal weakness or buckling: Yes Aggravating factors: Stairway, Prolonged Walking, Prolonged sitting  Relieving factors: Rest, Ice, Compression Sleeve, Massage  24-hour pain behavior: AM: "Wobbly" and "Stiff"  PM: Activity Dependent, more activity or walking aggravates the pain History of prior back, hip, or knee injury, pain, surgery, or therapy: Yes, improvement with PT for chronic knee pain Dominant hand: right Imaging: Yes, "significant  for minimal spacing between tibia and femur" Prior level of function: Independent  Occupational demands: Works at [employer] part time; Hobbies: Gardening, Yard work, Reading, [employer]: Negative for personal history of cancer, chills/fever, night sweats, nausea, vomiting, unexplained weight gain/loss, unrelenting pain  PRECAUTIONS: None  WEIGHT BEARING RESTRICTIONS: No  FALLS: Has patient fallen in last 6 months? No  Living Environment Lives with: lives alone Lives in: House/apartment  Patient Goals: "I would like for my knee pain to reduce and gain more stability.    OBJECTIVE:   Patient Surveys  FOTO 47, predicted to 58  Cognition Patient is oriented to person, place, and time.  Recent memory is intact.  Remote memory is intact.  Attention span and concentration are intact.  Expressive speech is intact.  Patient's fund of knowledge is within normal limits for educational level.    Gross Musculoskeletal Assessment Tremor: None Bulk: Normal Tone: Normal  GAIT: Deferred   Posture: Deferred  AROM AROM (Normal range in degrees) AROM  Hip Right Left  Flexion (125)    Extension (15)    Abduction (40)    Adduction     Internal Rotation (45)    External Rotation (45)        Knee    Flexion (135)    Extension (0)        Ankle    Dorsiflexion (20)    Plantarflexion (50)    Inversion (35)    Eversion (15    (* = pain; Blank rows = not tested)  LE MMT: MMT (out of 5) Right Left  Hip flexion 4 4  Hip extension    Hip abduction    Hip adduction    Hip internal rotation 5 4  Hip external rotation 5 4+  Knee flexion 5 5  Knee extension 5 5  Ankle dorsiflexion 5 5  Ankle plantarflexion 5 5  Ankle inversion    Ankle eversion    (* = pain; Blank rows = not tested)  Sensation Grossly intact to light touch bilateral LEs as determined by testing dermatomes L2-S2. Proprioception, and hot/cold testing deferred on this  date.  Reflexes (R/L) KJ: 2+/2+  Muscle Length Hamstrings: Deferred Quadriceps Michela Pitcher): Deferred Hip flexors Maisie Fus): Deferred IT band Claiborne Rigg): R: Deferred L: Negative  Palpation  Location LEFT  RIGHT           Quadriceps 1 (lateral aspect)    Medial Hamstrings 0   Lateral Hamstrings 0   Lateral Hamstring tendon 1   Medial Hamstring tendon 1   Quadriceps tendon 0   Patella 0   Patellar Tendon 0   Tibial Tuberosity    Medial joint line 1   Lateral joint line 1   MCL    LCL    Adductor Tubercle    Pes Anserine tendon 1   Infrapatellar fat pad 0   Fibular head 1   Popliteal fossa 0   IT band  2   (Blank rows = not tested) Graded on 0-4 scale (0 = no pain, 1 = pain, 2 = pain with wincing/grimacing/flinching, 3 = pain with withdrawal, 4 = unwilling to allow palpation), (Blank rows = not tested)  Passive Accessory Motion Tibiofemoral: Deferred  Fibula on Femur: Deferred  Patellofemoral: Superior Glide: R: Negative L: Negative Inferior Glide: R: Negative L: Negative Medial Glide: R: Negative L: Negative Lateral Glide: R: Negative L: Negative  Medial Tilt: R: Negative L: Negative Lateral Tilt: R: Not done L: Positive for discomfort  SPECIAL TESTS  Ligamentous Stability  ACL: Lachman's: R: Negative L: Negative  PCL: Posterior Drawer: R: Negative L: Negative  MCL: Valgus Stress (30 degrees flexion): R: Negative L: Negative  LCL: Varus Stress (30 degrees flexion): R: Negative L: Negative  Meniscus  Tests McMurray's Test: Negative Steinmann Sign I: R: Negative L: Negative  Patellofemoral Pain Syndrome Patellar Tilt (Lateral): R: negative  L: Negative Squatting pain: Deferred Stair climbing pain: Deferred  Patellar Tendinopathy Deferred     TODAY'S TREATMENT (04/23/23): ***  SUBJECTIVE: Patient reports to physical therapy with no pain but generalized stiffness especially in the knees. No further questions currently.   PAIN: generalized "stiffness"  in knees but no pain   Therex:  NuStep Level 1-4 x 8 min for warmup and interval history, PT manually adjusted resisted;  Total Gym (TG) Level 22 (L22) double leg squats 2 x 15; TG L22 single leg squat 2 x 8 BLE;  TG L22 double leg heel raises 2 x 20;  Standing hip strengthening with 5# ankle weights: Hip flexion marches x 15 BLE; HS curls x 15 BLE; Hip abduction x 15 BLE; Hip extension x 15 BLE;  Seated LAQ with 5# ankle weights x 15 BLE; Airex tandem balance alternating forward lower extremity x 30 seconds each; Forward BOSU lunges with BUE support x 10 BLE;   Not performed: Rockerboard balance in A/P and R/L orientations eyes open x 60s each direction; Rockerboard weight shifts A/P and R/L orientations x 60s each; Lateral band walks with blue tband around thighs x multiple lengths in parallel bars;   PATIENT EDUCATION:  Education details: Pt educated throughout session about proper posture and technique with exercises. Improved exercise technique, movement at target joints, use of target muscles after min to mod verbal, visual, tactile cues. Three systems for balance. Person educated: Patient Education method: Explanation, Demonstration, and Handouts Education comprehension: verbalized understanding   HOME EXERCISE PROGRAM:  Access Code: [email or web address] URL: [email or web address] Date: 02/27/2023 Prepared by: Ria Comment  Exercises - Seated Hamstring Stretch  - 1 x daily - 7 x weekly - 3 sets - 30 hold - Sidelying Quadriceps Stretch  - 1 x daily - 7 x weekly - 3 sets - 10 reps - 30 hold - Supine Piriformis Stretch  - 1 x daily - 7 x weekly - 3 sets - 30 hold - Supine Bridge  - 1 x daily - 7 x weekly - 2-3 sets - 10 reps - 5 hold - Seated Hip Abduction with Resistance  - 1 x daily - 7 x weekly - 2-3 sets - 10 reps - 5 hold - Mini Squat with Counter Support  - 1 x daily - 7 x weekly - 2-3 sets - 10 reps - 5 hold   ASSESSMENT:  CLINICAL IMPRESSION:  *** Patient reported to physical therapy highly motivated to improve LE strength and reduce pain. Today's session with a main focus on improving LE strength. Anticipated maximum improvement is attainable and reasonable in a generally predictable time. Based on her current limitations and today's performance PT continues to recommend skilled physical therapy 1-2x/week in order to maximize functional gains and return to prior level of function.   OBJECTIVE IMPAIRMENTS: decreased activity tolerance, decreased balance, difficulty walking, decreased strength, and pain.   ACTIVITY LIMITATIONS: bending, sitting, standing, squatting, and stairs  PARTICIPATION LIMITATIONS: shopping, community activity, and yard work  PERSONAL FACTORS: Age, Fitness, Past/current experiences, Time since onset of injury/illness/exacerbation, and 3+ comorbidities: OA, a-fib, OSA, and CHF  are also affecting patient's functional outcome.   REHAB POTENTIAL: Good  CLINICAL DECISION MAKING: Evolving/moderate complexity  EVALUATION COMPLEXITY: Moderate   GOALS: Goals reviewed with patient? Yes  SHORT TERM GOALS: Target date: 02/15/2023  Pt will be independent with HEP to improve  strength and decrease knee pain to improve pain-free function at home and work. Baseline: n/a; 03/20/2023: Endorsed HEP without Verbal Cues Goal status: Goal Met    LONG TERM GOALS: Target date: 05/01/2023 Pt will increase FOTO to at least 58 to demonstrate significant improvement in function at home and work related to knee pain  Baseline: 01/18/23: 47; 03/20/2023: 59 Goal status: Goal Met  2.  Pt will decrease worst knee pain by at least 3 points on the NPRS in order to demonstrate clinically significant reduction in knee pain. Baseline: 01/18/2023: 3/10; 03/20/2023: 1/10 Goal status: Progressing   3.  Pt will increase strength of hip flexion and hip abduction by at least 1/2 MMT grade in order to demonstrate improvement in strength and  function  Baseline: 03/20/2023: 4+/5 Hip Flexion, Hip Abduction 4+/5;  Goal status: Goal Met    PLAN: PT FREQUENCY: 1x/week  PT DURATION: 6 weeks  PLANNED INTERVENTIONS: Therapeutic exercises, Therapeutic activity, Neuromuscular re-education, Balance training, Gait training, Patient/Family education, Self Care, Joint mobilization, Joint manipulation, Vestibular training, Canalith repositioning, Orthotic/Fit training, DME instructions, Dry Needling, Electrical stimulation, Spinal manipulation, Spinal mobilization, Cryotherapy, Moist heat, Taping, Traction, Ultrasound, Ionotophoresis 4mg /ml Dexamethasone, Manual therapy, and Re-evaluation.  PLAN FOR NEXT SESSION: Progress hip, knee, and ankle strengthening;    Maylon Peppers, PT, DPT 04/23/2023 1:18 PM

## 2023-04-24 ENCOUNTER — Ambulatory Visit: Payer: Medicare Other

## 2023-04-24 DIAGNOSIS — M6281 Muscle weakness (generalized): Secondary | ICD-10-CM | POA: Diagnosis not present

## 2023-04-24 DIAGNOSIS — G8929 Other chronic pain: Secondary | ICD-10-CM

## 2023-04-24 DIAGNOSIS — M25562 Pain in left knee: Secondary | ICD-10-CM | POA: Diagnosis not present

## 2023-04-30 ENCOUNTER — Ambulatory Visit (INDEPENDENT_AMBULATORY_CARE_PROVIDER_SITE_OTHER): Payer: Medicare Other

## 2023-04-30 DIAGNOSIS — I4819 Other persistent atrial fibrillation: Secondary | ICD-10-CM

## 2023-04-30 LAB — CUP PACEART REMOTE DEVICE CHECK
Date Time Interrogation Session: 20241215231221
Implantable Pulse Generator Implant Date: 20230928

## 2023-05-01 NOTE — Therapy (Signed)
OUTPATIENT PHYSICAL THERAPY KNEE TREATMENT/RECERTIFICATION  Patient Name: WILETTA COLUMBIA MRN: [id number] DOB:[birth year], 67 y.o., female Today's Date: 05/01/2023  END OF SESSION:    Past Medical History:  Diagnosis Date   Acute lower GI bleeding 01/19/2020   Arthritis    Atrial fibrillation with rapid ventricular response (HCC)    Chicken pox    Chronic systolic CHF (congestive heart failure) (HCC)    a. 03/2016 Echo: Ef 15-20%, diff HK, ant AK;  b. 05/2016 Echo: Ef 30-35%, diff HK, mildly dil LA/RA.   Diabetes mellitus without complication (HCC)    Dysrhythmia    GERD (gastroesophageal reflux disease)    Heart murmur    Hip discomfort    been going on for 40 years    History of kidney stones    Hyperlipidemia associated with type 2 diabetes mellitus (HCC) 05/12/2021   Hypertension    Moderate mitral regurgitation    a. 03/2016 Echo: mod MR in setting of LV dysfxn.   Motion sickness    car - back seat   NICM (nonischemic cardiomyopathy) (HCC)    a. 03/2016 Echo: EF 15-20%, diff HK, ant AK, mod MR, mod dil LA, mildly dil RA;  b. 04/2016 Cath: nl cors;  c. 05/2016 Echo: EF 30-35%, diff HK.   Obesity    Obstructive sleep apnea    compliant with CPAP   Persistent atrial fibrillation (HCC)    a. Dx 03/2016;  b. CHA2DS2VASc = 2-->Eliquis 5mg  BID;  b. 05/2016 Failed DCCV x 4.   Sleep apnea    Stomach irritation    Visit for monitoring Tikosyn therapy 07/24/2016   Past Surgical History:  Procedure Laterality Date   ATRIAL FIBRILLATION ABLATION N/A 12/09/2020   Procedure: ATRIAL FIBRILLATION ABLATION;  Surgeon: Lanier Prude, MD;  Location: [facility];  Service: Cardiovascular;  Laterality: N/A;   BIOPSY N/A 01/12/2020   Procedure: BIOPSY;  Surgeon: Pasty Spillers, MD;  Location: [facility];  Service: Endoscopy;  Laterality: N/A;   CARDIAC CATHETERIZATION N/A 04/17/2016   Procedure: Left Heart Cath and Coronary Angiography;  Surgeon: Iran Ouch, MD;   Location: [facility];  Service: Cardiovascular;  Laterality: N/A;   CARDIOVERSION N/A 10/22/2020   Procedure: CARDIOVERSION;  Surgeon: Yvonne Kendall, MD;  Location: [facility];  Service: Cardiovascular;  Laterality: N/A;   CHOLECYSTECTOMY     COLON SURGERY  2021   COLONOSCOPY N/A 01/20/2020   Procedure: COLONOSCOPY;  Surgeon: Regis Bill, MD;  Location: [facility];  Service: Endoscopy;  Laterality: N/A;   COLONOSCOPY WITH PROPOFOL N/A 01/12/2020   Procedure: COLONOSCOPY WITH PROPOFOL;  Surgeon: Pasty Spillers, MD;  Location: [facility];  Service: Endoscopy;  Laterality: N/A;  Diabetic - oral meds sleep apnea   ELECTROPHYSIOLOGIC STUDY N/A 05/26/2016   Procedure: Cardioversion;  Surgeon: Iran Ouch, MD;  Location: [facility];  Service: Cardiovascular;  Laterality: N/A;   ESOPHAGOGASTRODUODENOSCOPY (EGD) WITH PROPOFOL N/A 01/12/2020   Procedure: ESOPHAGOGASTRODUODENOSCOPY (EGD) WITH PROPOFOL;  Surgeon: Pasty Spillers, MD;  Location: [facility];  Service: Endoscopy;  Laterality: N/A;   POLYPECTOMY N/A 01/12/2020   Procedure: POLYPECTOMY;  Surgeon: Pasty Spillers, MD;  Location: [facility];  Service: Endoscopy;  Laterality: N/A;   Patient Active Problem List   Diagnosis Date Noted   Peroneal tendinitis, left 10/13/2022   Left lumbar radiculopathy 07/26/2022   Arthralgia of left knee 07/26/2022   Pes anserinus bursitis of right knee 04/19/2022  Primary osteoarthritis of right knee 03/27/2022   Greater 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 03/27/2022   It band syndrome, right 03/27/2022   Hyperlipidemia associated with type 2 diabetes mellitus (HCC) 05/12/2021   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   Varicose veins of leg with pain, bilateral 07/06/2020   Acquired thrombophilia (HCC) 05/24/2020   Type II diabetes mellitus with complication (HCC) 01/19/2020   Gastric polyp    Colon polyps     Varicose veins of both lower extremities 10/20/2019   Dysphagia 10/20/2019   Osteoarthritis of right hand 10/31/2016   Nonischemic cardiomyopathy (HCC) 04/14/2016   BMI 33.0-33.9,adult 04/14/2016   PCP: Reubin Milan, MD  REFERRING PROVIDER: [name], MD  REFERRING DIAG: [phone] (ICD-10-CM) - Peroneal tendinitis, left   RATIONALE FOR EVALUATION AND TREATMENT: Rehabilitation  THERAPY DIAG: Left Knee Pain  ONSET DATE: Chronic Left Knee Pain   FOLLOW-UP APPT SCHEDULED WITH REFERRING PROVIDER: No    SUBJECTIVE:                                                                                                                                                                                         SUBJECTIVE STATEMENT:  Left knee pain   PERTINENT HISTORY:  Patient reports onset of progressing left knee pain within the last couple months. Patient denies trauma to the knee and has started using a compression sleeve for stability. She has had a history of left knee pain with improvement via physical therapy but rehab for the knee ended early due to pain at the ankle. She endorsed that imaging is significant for minimal space along the joint and she experiences an occasional "catch" or "pop". She denies swelling, bruising or radiating pain below the knee.    PAIN:   Pain Intensity: Present: 0/10, Best: 0/10, Worst: 3/10 Pain location: Left knee  Pain quality:  Catching   Radiating pain: Yes  Swelling: No  Popping, catching, locking: Yes  Numbness/Tingling: No Focal weakness or buckling: Yes Aggravating factors: Stairway, Prolonged Walking, Prolonged sitting  Relieving factors: Rest, Ice, Compression Sleeve, Massage  24-hour pain behavior: AM: "Wobbly" and "Stiff"  PM: Activity Dependent, more activity or walking aggravates the pain History of prior back, hip, or knee injury, pain, surgery, or therapy: Yes, improvement with PT for chronic knee pain Dominant hand: right Imaging:  Yes, "significant for minimal spacing between tibia and femur" Prior level of function: Independent  Occupational demands: Works at [employer] part time; Hobbies: Gardening, Yard work, [place], [employer]: Negative for personal history of cancer, chills/fever, night sweats, nausea, vomiting, unexplained weight gain/loss, unrelenting pain  PRECAUTIONS: None  WEIGHT BEARING RESTRICTIONS: No  FALLS: Has patient fallen in last 6 months? No  Living Environment Lives with: lives alone Lives in: House/apartment  Patient Goals: "I would like for my knee pain to reduce and gain more stability.    OBJECTIVE:   Patient Surveys  FOTO 47, predicted to 35  Cognition Patient is oriented to person, place, and time.  Recent memory is intact.  Remote memory is intact.  Attention span and concentration are intact.  Expressive speech is intact.  Patient's fund of knowledge is within normal limits for educational level.    Gross Musculoskeletal Assessment Tremor: None Bulk: Normal Tone: Normal  GAIT: Deferred   Posture: Deferred  AROM AROM (Normal range in degrees) AROM  Hip Right Left  Flexion (125)    Extension (15)    Abduction (40)    Adduction     Internal Rotation (45)    External Rotation (45)        Knee    Flexion (135)    Extension (0)        Ankle    Dorsiflexion (20)    Plantarflexion (50)    Inversion (35)    Eversion (15    (* = pain; Blank rows = not tested)  LE MMT: MMT (out of 5) Right Left  Hip flexion 4 4  Hip extension    Hip abduction    Hip adduction    Hip internal rotation 5 4  Hip external rotation 5 4+  Knee flexion 5 5  Knee extension 5 5  Ankle dorsiflexion 5 5  Ankle plantarflexion 5 5  Ankle inversion    Ankle eversion    (* = pain; Blank rows = not tested)  Sensation Grossly intact to light touch bilateral LEs as determined by testing dermatomes L2-S2. Proprioception, and hot/cold testing deferred on  this date.  Reflexes (R/L) KJ: 2+/2+  Muscle Length Hamstrings: Deferred Quadriceps Michela Pitcher): Deferred Hip flexors Maisie Fus): Deferred IT band Claiborne Rigg): R: Deferred L: Negative  Palpation  Location LEFT  RIGHT           Quadriceps 1 (lateral aspect)    Medial Hamstrings 0   Lateral Hamstrings 0   Lateral Hamstring tendon 1   Medial Hamstring tendon 1   Quadriceps tendon 0   Patella 0   Patellar Tendon 0   Tibial Tuberosity    Medial joint line 1   Lateral joint line 1   MCL    LCL    Adductor Tubercle    Pes Anserine tendon 1   Infrapatellar fat pad 0   Fibular head 1   Popliteal fossa 0   IT band  2   (Blank rows = not tested) Graded on 0-4 scale (0 = no pain, 1 = pain, 2 = pain with wincing/grimacing/flinching, 3 = pain with withdrawal, 4 = unwilling to allow palpation), (Blank rows = not tested)  Passive Accessory Motion Tibiofemoral: Deferred  Fibula on Femur: Deferred  Patellofemoral: Superior Glide: R: Negative L: Negative Inferior Glide: R: Negative L: Negative Medial Glide: R: Negative L: Negative Lateral Glide: R: Negative L: Negative  Medial Tilt: R: Negative L: Negative Lateral Tilt: R: Not done L: Positive for discomfort  SPECIAL TESTS  Ligamentous Stability  ACL: Lachman's: R: Negative L: Negative  PCL: Posterior Drawer: R: Negative L: Negative  MCL: Valgus Stress (30 degrees flexion): R: Negative L: Negative  LCL: Varus Stress (30 degrees flexion): R: Negative L: Negative  Meniscus  Tests McMurray's Test: Negative Steinmann Sign I: R: Negative L: Negative  Patellofemoral Pain Syndrome Patellar Tilt (Lateral): R: negative  L: Negative Squatting pain: Deferred Stair climbing pain: Deferred  Patellar Tendinopathy Deferred     TODAY'S TREATMENT (05/01/23):   SUBJECTIVE: Patient reports to physical therapy with no pain but generalized stiffness especially in the knees. No further questions currently.   PAIN: generalized  "stiffness" in knees but no pain   Therex:  NuStep Level 1-4 x 8 min for warmup and interval history, PT manually adjusted resisted;   Updated Outcomes:  FOTO: 59'  NRPS: 1/10; MMT L Hip Abduction and L Hip Flexion: 4+/5;      Total Gym (TG) Level 22 (L22) double leg squats 2 x 15; TG L22 single leg squat 2 x 8 BLE;   Seated LAQ with 5# ankle weights 2 x 10 BLE;  High marching at blue agility ladder x 3 laps with 5# AW  Lateral stepping at blue agility ladder x 3 laps with 5# AW Hip adduction ball squeeze 2 x 10 with 3-5 second hold   Standing hip strengthening with 5# ankle weights: HS curls x 15 BLE;   Not performed: Rockerboard balance in A/P and R/L orientations eyes open x 60s each direction; Rockerboard weight shifts A/P and R/L orientations x 60s each; Lateral band walks with blue tband around thighs x multiple lengths in parallel bars;   PATIENT EDUCATION:  Education details: Pt educated throughout session about proper posture and technique with exercises. Improved exercise technique, movement at target joints, use of target muscles after min to mod verbal, visual, tactile cues. Three systems for balance. Person educated: Patient Education method: Explanation, Demonstration, and Handouts Education comprehension: verbalized understanding   HOME EXERCISE PROGRAM:  Access Code: [email or web address] URL: [email or web address] Date: 02/27/2023 Prepared by: Ria Comment  Exercises - Seated Hamstring Stretch  - 1 x daily - 7 x weekly - 3 sets - 30 hold - Sidelying Quadriceps Stretch  - 1 x daily - 7 x weekly - 3 sets - 10 reps - 30 hold - Supine Piriformis Stretch  - 1 x daily - 7 x weekly - 3 sets - 30 hold - Supine Bridge  - 1 x daily - 7 x weekly - 2-3 sets - 10 reps - 5 hold - Seated Hip Abduction with Resistance  - 1 x daily - 7 x weekly - 2-3 sets - 10 reps - 5 hold - Mini Squat with Counter Support  - 1 x daily - 7 x weekly - 2-3 sets - 10 reps - 5  hold   ASSESSMENT:  CLINICAL IMPRESSION:  Patient reported to physical therapy highly motivated to improve LE strength and reduce pain. Today's session with a main focus on improving LE strength and reassessing progress towards goals. Patient has demonstrated improvements in pain, strength and functional movement as indicated on her updated outcome measures today. She is able to perform deeper ranges of squats without pain but still continues to have increased pain with prolonged activities such as gardening, stairs and longer walking distances. Anticipated maximum improvement is attainable and reasonable in a generally predictable time. Based on her current limitations and today's performance PT continues to recommend skilled physical therapy 1-2x/week in order to maximize functional gains and return to prior level of function.   Patient arrives to PT treatment session motivated to participate. Session focused on BLE strengthening. Patient reporting feeling weaker than usual today but able to tolerate similar resistance as previous  session. Required seated therapeutic rest breaks between each exercise. Patient will continue to benefit from skilled therapy to address remaining deficits in order to improve quality of life and return to PLOF.    OBJECTIVE IMPAIRMENTS: decreased activity tolerance, decreased balance, difficulty walking, decreased strength, and pain.   ACTIVITY LIMITATIONS: bending, sitting, standing, squatting, and stairs  PARTICIPATION LIMITATIONS: shopping, community activity, and yard work  PERSONAL FACTORS: Age, Fitness, Past/current experiences, Time since onset of injury/illness/exacerbation, and 3+ comorbidities: OA, a-fib, OSA, and CHF  are also affecting patient's functional outcome.   REHAB POTENTIAL: Good  CLINICAL DECISION MAKING: Evolving/moderate complexity  EVALUATION COMPLEXITY: Moderate   GOALS: Goals reviewed with patient? Yes  SHORT TERM GOALS: Target  date: 02/15/2023  Pt will be independent with HEP to improve strength and decrease knee pain to improve pain-free function at home and work. Baseline: n/a; 03/20/2023: Endorsed HEP without Verbal Cues Goal status: Goal Met    LONG TERM GOALS: Target date: 05/01/2023 Pt will increase FOTO to at least 58 to demonstrate significant improvement in function at home and work related to knee pain  Baseline: 01/18/23: 47; 03/20/2023: 59 Goal status: Goal Met  2.  Pt will decrease worst knee pain by at least 3 points on the NPRS in order to demonstrate clinically significant reduction in knee pain. Baseline: 01/18/2023: 3/10; 03/20/2023: 1/10 Goal status: Progressing   3.  Pt will increase strength of hip flexion and hip abduction by at least 1/2 MMT grade in order to demonstrate improvement in strength and function  Baseline: 03/20/2023: 4+/5 Hip Flexion, Hip Abduction 4+/5;  Goal status: Goal Met    PLAN: PT FREQUENCY: 1x/week  PT DURATION: 6 weeks  PLANNED INTERVENTIONS: Therapeutic exercises, Therapeutic activity, Neuromuscular re-education, Balance training, Gait training, Patient/Family education, Self Care, Joint mobilization, Joint manipulation, Vestibular training, Canalith repositioning, Orthotic/Fit training, DME instructions, Dry Needling, Electrical stimulation, Spinal manipulation, Spinal mobilization, Cryotherapy, Moist heat, Taping, Traction, Ultrasound, Ionotophoresis 4mg /ml Dexamethasone, Manual therapy, and Re-evaluation.  PLAN FOR NEXT SESSION: Progress hip, knee, and ankle strengthening;    Maylon Peppers, PT, DPT 05/01/2023 10:54 AM

## 2023-05-03 ENCOUNTER — Ambulatory Visit: Payer: Medicare Other

## 2023-05-03 DIAGNOSIS — M25562 Pain in left knee: Secondary | ICD-10-CM | POA: Diagnosis not present

## 2023-05-03 DIAGNOSIS — G8929 Other chronic pain: Secondary | ICD-10-CM

## 2023-05-03 DIAGNOSIS — M6281 Muscle weakness (generalized): Secondary | ICD-10-CM | POA: Diagnosis not present

## 2023-05-10 ENCOUNTER — Encounter: Payer: Self-pay | Admitting: Internal Medicine

## 2023-05-14 DIAGNOSIS — M8588 Other specified disorders of bone density and structure, other site: Secondary | ICD-10-CM | POA: Diagnosis not present

## 2023-05-14 DIAGNOSIS — E2839 Other primary ovarian failure: Secondary | ICD-10-CM | POA: Diagnosis not present

## 2023-05-14 DIAGNOSIS — Z1231 Encounter for screening mammogram for malignant neoplasm of breast: Secondary | ICD-10-CM | POA: Diagnosis not present

## 2023-05-14 LAB — HM DEXA SCAN

## 2023-05-14 LAB — HM MAMMOGRAPHY

## 2023-05-18 ENCOUNTER — Ambulatory Visit (INDEPENDENT_AMBULATORY_CARE_PROVIDER_SITE_OTHER): Payer: Medicare Other | Admitting: Internal Medicine

## 2023-05-18 ENCOUNTER — Encounter: Payer: Self-pay | Admitting: Internal Medicine

## 2023-05-18 VITALS — BP 118/70 | HR 67 | Ht 67.0 in | Wt 226.0 lb

## 2023-05-18 DIAGNOSIS — E1169 Type 2 diabetes mellitus with other specified complication: Secondary | ICD-10-CM

## 2023-05-18 DIAGNOSIS — E118 Type 2 diabetes mellitus with unspecified complications: Secondary | ICD-10-CM | POA: Diagnosis not present

## 2023-05-18 DIAGNOSIS — Z7984 Long term (current) use of oral hypoglycemic drugs: Secondary | ICD-10-CM | POA: Diagnosis not present

## 2023-05-18 DIAGNOSIS — Z6835 Body mass index (BMI) 35.0-35.9, adult: Secondary | ICD-10-CM

## 2023-05-18 DIAGNOSIS — E785 Hyperlipidemia, unspecified: Secondary | ICD-10-CM | POA: Diagnosis not present

## 2023-05-18 DIAGNOSIS — M67442 Ganglion, left hand: Secondary | ICD-10-CM | POA: Insufficient documentation

## 2023-05-18 DIAGNOSIS — Z0000 Encounter for general adult medical examination without abnormal findings: Secondary | ICD-10-CM

## 2023-05-18 DIAGNOSIS — I428 Other cardiomyopathies: Secondary | ICD-10-CM

## 2023-05-18 MED ORDER — DAPAGLIFLOZIN PROPANEDIOL 10 MG PO TABS: 10.0000 mg | ORAL_TABLET | Freq: Every day | ORAL | 1 refills | Status: DC

## 2023-05-18 NOTE — Progress Notes (Signed)
 Date:  05/18/2023   Name:  Brandy [name]   DOB:  Mar 18, 1956   MRN:  [id number]   Chief Complaint: Annual Exam Brandy [name] is a 68 y.o. female who presents today for her Complete Annual Exam. She feels well. She reports exercising. She reports she is sleeping well. Breast complaints - none.  Mammogram: 04/2023 DEXA: 04/2023 normal hip/osteopenia spine Colonoscopy: 05/2021 repeat 3 yrs  Health Maintenance Due  Topic Date Due   DTaP/Tdap/Td (2 - Tdap) 12/11/2011   COVID-19 Vaccine (4 - 2024-25 season) 01/14/2023    Immunization History  Administered Date(s) Administered   PFIZER(Purple Top)SARS-COV-2 Vaccination 02/06/2020, 02/27/2020, 07/29/2020   PNEUMOCOCCAL CONJUGATE-20 02/22/2021   Pneumococcal Conjugate-13 10/24/2019   Td 12/10/2001   Td (Adult), 2 Lf Tetanus Toxid, Preservative Free 12/10/2001   Zoster Recombinant(Shingrix ) 06/10/2021, 11/07/2021     Diabetes She presents for her follow-up diabetic visit. She has type 2 diabetes mellitus. Her disease course has been stable. Pertinent negatives for hypoglycemia include no headaches or tremors. Pertinent negatives for diabetes include no chest pain, no fatigue, no polydipsia and no polyuria. Current diabetic treatments: MTF.  Hyperlipidemia This is a chronic problem. Recent lipid tests were reviewed and are high. Pertinent negatives include no chest pain or shortness of breath. Current antihyperlipidemic treatment includes diet change.  NICM - has pacemaker and sees Cardiology regularly.  Pacer interrogated monthly with no Afib episodes.    Review of Systems  Constitutional:  Negative for appetite change, fatigue, fever and unexpected weight change.  HENT:  Negative for tinnitus and trouble swallowing.   Eyes:  Negative for visual disturbance.  Respiratory:  Negative for cough, chest tightness and shortness of breath.   Cardiovascular:  Negative for chest pain, palpitations and leg swelling.  Gastrointestinal:  Negative for  abdominal pain.  Endocrine: Negative for polydipsia and polyuria.  Genitourinary:  Negative for dysuria and hematuria.  Musculoskeletal:  Negative for arthralgias.  Neurological:  Negative for tremors, numbness and headaches.  Psychiatric/Behavioral:  Negative for dysphoric mood.      Lab Results  Component Value Date   NA 137 02/12/2023   K 3.6 02/12/2023   CO2 22 02/12/2023   GLUCOSE 133 (H) 02/12/2023   BUN 18 02/12/2023   CREATININE 0.90 02/12/2023   CALCIUM  9.2 02/12/2023   EGFR 90.001 10/24/2022   GFRNONAA >60 02/12/2023   Lab Results  Component Value Date   CHOL 166 03/20/2022   HDL 46 03/20/2022   LDLCALC 97 03/20/2022   LDLDIRECT 131.0 10/31/2016   TRIG 127 03/20/2022   CHOLHDL 3.6 03/20/2022   Lab Results  Component Value Date   TSH 1.460 08/01/2021   Lab Results  Component Value Date   HGBA1C 5.5 12/20/2022   Lab Results  Component Value Date   WBC 10.4 02/12/2023   HGB 14.2 02/12/2023   HCT 40.8 02/12/2023   MCV 87.9 02/12/2023   PLT 272 02/12/2023   Lab Results  Component Value Date   ALT 14 03/20/2022   AST 14 03/20/2022   ALKPHOS 94 03/20/2022   BILITOT 0.4 03/20/2022   Lab Results  Component Value Date   VD25OH 38.71 10/31/2016     Patient Active Problem List   Diagnosis Date Noted   Peroneal tendinitis, left 10/13/2022   Left lumbar radiculopathy 07/26/2022   Arthralgia of left knee 07/26/2022   Pes anserinus bursitis of right knee 04/19/2022   Primary osteoarthritis of right knee 03/27/2022   Greater trochanteric pain syndrome  of right lower extremity 03/27/2022   It band syndrome, right 03/27/2022   Hyperlipidemia associated with type 2 diabetes mellitus (HCC) 05/12/2021   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   Varicose veins of leg with pain, bilateral 07/06/2020   Type II diabetes mellitus with complication (HCC) 01/19/2020   Gastric polyp    Colon polyps    Varicose veins of both lower extremities 10/20/2019    Dysphagia 10/20/2019   Osteoarthritis of right hand 10/31/2016   Nonischemic cardiomyopathy (HCC) 04/14/2016   BMI 33.0-33.9,adult 04/14/2016    No Known Allergies  Past Surgical History:  Procedure Laterality Date   ATRIAL FIBRILLATION ABLATION N/A 12/09/2020   Procedure: ATRIAL FIBRILLATION ABLATION;  Surgeon: Cindie Ole DASEN, MD;  Location: [facility];  Service: Cardiovascular;  Laterality: N/A;   BIOPSY N/A 01/12/2020   Procedure: BIOPSY;  Surgeon: Janalyn Keene NOVAK, MD;  Location: [facility];  Service: Endoscopy;  Laterality: N/A;   CARDIAC CATHETERIZATION N/A 04/17/2016   Procedure: Left Heart Cath and Coronary Angiography;  Surgeon: Deatrice DELENA Cage, MD;  Location: [facility] INVASIVE CV LAB;  Service: Cardiovascular;  Laterality: N/A;   CARDIOVERSION N/A 10/22/2020   Procedure: CARDIOVERSION;  Surgeon: Mady Bruckner, MD;  Location: [facility] ORS;  Service: Cardiovascular;  Laterality: N/A;   CHOLECYSTECTOMY     COLON SURGERY  2021   COLONOSCOPY N/A 01/20/2020   Procedure: COLONOSCOPY;  Surgeon: Maryruth Ole DASEN, MD;  Location: [facility] ENDOSCOPY;  Service: Endoscopy;  Laterality: N/A;   COLONOSCOPY WITH PROPOFOL  N/A 01/12/2020   Procedure: COLONOSCOPY WITH PROPOFOL ;  Surgeon: Janalyn Keene NOVAK, MD;  Location: [facility];  Service: Endoscopy;  Laterality: N/A;  Diabetic - oral meds sleep apnea   ELECTROPHYSIOLOGIC STUDY N/A 05/26/2016   Procedure: Cardioversion;  Surgeon: Deatrice DELENA Cage, MD;  Location: [facility] ORS;  Service: Cardiovascular;  Laterality: N/A;   ESOPHAGOGASTRODUODENOSCOPY (EGD) WITH PROPOFOL  N/A 01/12/2020   Procedure: ESOPHAGOGASTRODUODENOSCOPY (EGD) WITH PROPOFOL ;  Surgeon: Janalyn Keene NOVAK, MD;  Location: [facility];  Service: Endoscopy;  Laterality: N/A;   POLYPECTOMY N/A 01/12/2020   Procedure: POLYPECTOMY;  Surgeon: Janalyn Keene NOVAK, MD;  Location: [facility];  Service: Endoscopy;  Laterality: N/A;    Social  History   Tobacco Use   Smoking status: Never   Smokeless tobacco: Never  Vaping Use   Vaping status: Never Used  Substance Use Topics   Alcohol use: Never   Drug use: Never     Medication list has been reviewed and updated.  Current Meds  Medication Sig   CALCIUM  PO Take by mouth daily.   Celery Seed OIL Using as needed to help with constipation   Cholecalciferol  (VITAMIN D3) 1000 units CAPS Take 1,000 Units by mouth 2 (two) times daily.   dapagliflozin  propanediol (FARXIGA ) 10 MG TABS tablet Take 1 tablet (10 mg total) by mouth daily before breakfast.   furosemide  (LASIX ) 20 MG tablet Take 1 tablet (20 mg total) by mouth daily as needed for fluid or edema.   glucose blood (ONETOUCH VERIO) test strip USE 1 STRIP TO CHECK GLUCOSE UP TO 4 TIMES DAILY AS DIRECTED   Lancets (ONETOUCH DELICA PLUS LANCET33G) MISC USE  1 TO CHECK GLUCOSE UP TO 4 TIMES DAILY AS DIRECTED   Lavender Oil OIL Apply 1 application topically daily as needed (stress).   Lemon Oil OIL Take 1 application by mouth daily as needed (add to water ).   Lemongrass Oil OIL Apply 1 application topically daily as needed (multiple  uses).   Magnesium Bisglycinate (MAG GLYCINATE) 100 MG TABS Take 270 mg by mouth in the morning, at noon, and at bedtime.   metFORMIN  (GLUCOPHAGE -XR) 500 MG 24 hr tablet Take 1/2 (one-half) tablet by mouth twice daily   metoprolol  tartrate (LOPRESSOR ) 50 MG tablet Take 1/2 (one-half) tablet by mouth twice daily (Patient taking differently: Take 25 mg by mouth daily at 6 (six) AM. And a second half dose at noon if active)   Multiple Vitamins-Minerals (MULTIVITAMIN WITH MINERALS) tablet Take 1 tablet by mouth daily.   Oil Base OIL Breathe oil as needed for congestion   OIL OF OREGANO PO Uses as needed   peppermint oil liquid Apply 1 application topically daily as needed (itchy/ cold symptoms).   Tea Tree Oil OIL Apply 1 application topically daily as needed (fungus).   Turmeric 500 MG CAPS Take by  mouth.   vitamin B-12 (CYANOCOBALAMIN) 500 MCG tablet Take 500 mcg by mouth daily.   vitamin C (ASCORBIC ACID) 500 MG tablet Take 500 mg by mouth daily.    vitamin E 45 MG (100 UNITS) capsule Take by mouth daily.       05/18/2023    9:41 AM 12/20/2022    7:58 AM 09/06/2022   10:35 AM 03/20/2022    9:35 AM  GAD 7 : Generalized Anxiety Score  Nervous, Anxious, on Edge 1 1 0 1  Control/stop worrying 0 1 0 0  Worry too much - different things 0 1 0 0  Trouble relaxing 0 1 2 0  Restless 0 1 1 0  Easily annoyed or irritable 0 1 0 0  Afraid - awful might happen 0 1 0 0  Total GAD 7 Score 1 7 3 1   Anxiety Difficulty Not difficult at all Somewhat difficult Not difficult at all Not difficult at all       05/18/2023    9:41 AM 12/20/2022    7:58 AM 12/06/2022    3:39 PM  Depression screen PHQ 2/9  Decreased Interest 0 1 1  Down, Depressed, Hopeless 0 1 1  PHQ - 2 Score 0 2 2  Altered sleeping 0 2 1  Tired, decreased energy 0 1 1  Change in appetite 0 2   Feeling bad or failure about yourself  0 0   Trouble concentrating 0 1   Moving slowly or fidgety/restless 0 0   Suicidal thoughts 0 0   PHQ-9 Score 0 8 4  Difficult doing work/chores Not difficult at all Not difficult at all Not difficult at all    BP Readings from Last 3 Encounters:  05/18/23 118/70  03/13/23 127/62  02/28/23 137/73    Physical Exam Vitals and nursing note reviewed.  Constitutional:      General: She is not in acute distress.    Appearance: She is well-developed.  HENT:     Head: Normocephalic and atraumatic.     Right Ear: Tympanic membrane and ear canal normal.     Left Ear: Tympanic membrane and ear canal normal.     Nose:     Right Sinus: No maxillary sinus tenderness.     Left Sinus: No maxillary sinus tenderness.  Eyes:     General: No scleral icterus.       Right eye: No discharge.        Left eye: No discharge.     Conjunctiva/sclera: Conjunctivae normal.  Neck:     Thyroid : No thyromegaly.      Vascular: No  carotid bruit.  Cardiovascular:     Rate and Rhythm: Normal rate and regular rhythm.     Pulses: Normal pulses.     Heart sounds: Normal heart sounds.  Pulmonary:     Effort: Pulmonary effort is normal. No respiratory distress.     Breath sounds: No wheezing.  Abdominal:     General: Bowel sounds are normal.     Palpations: Abdomen is soft.     Tenderness: There is no abdominal tenderness.  Musculoskeletal:     Left hand: Swelling present.     Cervical back: Normal range of motion. No erythema.     Right lower leg: No edema.     Left lower leg: No edema.     Comments: Mucous cyst left index finger distal phalanx  Lymphadenopathy:     Cervical: No cervical adenopathy.  Skin:    General: Skin is warm and dry.     Findings: No rash.  Neurological:     Mental Status: She is alert and oriented to person, place, and time.     Cranial Nerves: No cranial nerve deficit.     Sensory: No sensory deficit.     Deep Tendon Reflexes: Reflexes are normal and symmetric.  Psychiatric:        Attention and Perception: Attention normal.        Mood and Affect: Mood normal.    Diabetic Foot Exam - Simple   Simple Foot Form Diabetic Foot exam was performed with the following findings: Yes 05/18/2023 10:03 AM  Visual Inspection See comments: Yes Sensation Testing Intact to touch and monofilament testing bilaterally: Yes Pulse Check Posterior Tibialis and Dorsalis pulse intact bilaterally: Yes Comments Bony enlargement dorsum left foot      Wt Readings from Last 3 Encounters:  05/18/23 226 lb (102.5 kg)  03/13/23 223 lb (101.2 kg)  02/28/23 225 lb (102.1 kg)    BP 118/70   Pulse 67   Ht 5' 7 (1.702 m)   Wt 226 lb (102.5 kg)   SpO2 96%   BMI 35.40 kg/m   Assessment and Plan:  Problem List Items Addressed This Visit       Unprioritized   Nonischemic cardiomyopathy (HCC) (Chronic)   Followed by Cardiology - has pacemaker and no evidence of Afib On BB daily.       Relevant Orders   CBC with Differential/Platelet   TSH   Type II diabetes mellitus with complication (HCC) (Chronic)   Blood sugars stable without hypoglycemic symptoms or events. Currently managed with MTF. Changes made last visit are adding Farxiga  - she started this a month ago. Lab Results  Component Value Date   HGBA1C 5.5 12/20/2022         Relevant Medications   dapagliflozin  propanediol (FARXIGA ) 10 MG TABS tablet   Other Relevant Orders   Comprehensive metabolic panel   Hemoglobin A1c   Microalbumin / creatinine urine ratio   Hyperlipidemia associated with type 2 diabetes mellitus (HCC) (Chronic)   Not currently on statin therapy. Lab Results  Component Value Date   LDLCALC 97 03/20/2022         Relevant Medications   dapagliflozin  propanediol (FARXIGA ) 10 MG TABS tablet   Other Relevant Orders   Lipid panel   Other Visit Diagnoses       Annual physical exam    -  Primary   up to date on screenings and immunizations     Mucous cyst of digit of left hand  Relevant Orders   Ambulatory referral to Orthopedic Surgery     BMI 35.0-35.9,adult   (Chronic)       Long term current use of oral hypoglycemic drug           Return in about 4 months (around 09/15/2023) for DM.    Leita HILARIO Adie, MD [facility] and [facility] [place]

## 2023-05-18 NOTE — Assessment & Plan Note (Signed)
 Followed by Cardiology - has pacemaker and no evidence of Afib On BB daily.

## 2023-05-18 NOTE — Assessment & Plan Note (Signed)
 Not currently on statin therapy. Lab Results  Component Value Date   LDLCALC 97 03/20/2022

## 2023-05-18 NOTE — Assessment & Plan Note (Addendum)
 Blood sugars stable without hypoglycemic symptoms or events. Currently managed with MTF. Changes made last visit are adding [place] - she started this a month ago. Lab Results  Component Value Date   HGBA1C 5.5 12/20/2022

## 2023-05-19 LAB — COMPREHENSIVE METABOLIC PANEL
ALT: 22 [IU]/L (ref 0–32)
AST: 17 [IU]/L (ref 0–40)
Albumin: 4.5 g/dL (ref 3.9–4.9)
Alkaline Phosphatase: 87 [IU]/L (ref 44–121)
BUN/Creatinine Ratio: 18 (ref 12–28)
BUN: 16 mg/dL (ref 8–27)
Bilirubin Total: 0.4 mg/dL (ref 0.0–1.2)
CO2: 22 mmol/L (ref 20–29)
Calcium: 9.6 mg/dL (ref 8.7–10.3)
Chloride: 106 mmol/L (ref 96–106)
Creatinine, Ser: 0.91 mg/dL (ref 0.57–1.00)
Globulin, Total: 2.8 g/dL (ref 1.5–4.5)
Glucose: 104 mg/dL — ABNORMAL HIGH (ref 70–99)
Potassium: 4.6 mmol/L (ref 3.5–5.2)
Sodium: 141 mmol/L (ref 134–144)
Total Protein: 7.3 g/dL (ref 6.0–8.5)
eGFR: 69 mL/min/{1.73_m2} (ref >59)

## 2023-05-19 LAB — LIPID PANEL
Chol/HDL Ratio: 4.4 {ratio} (ref 0.0–4.4)
Cholesterol, Total: 191 mg/dL (ref 100–199)
HDL: 43 mg/dL (ref >39)
LDL Chol Calc (NIH): 120 mg/dL — ABNORMAL HIGH (ref 0–99)
Triglycerides: 157 mg/dL — ABNORMAL HIGH (ref 0–149)
VLDL Cholesterol Cal: 28 mg/dL (ref 5–40)

## 2023-05-19 LAB — MICROALBUMIN / CREATININE URINE RATIO
Creatinine, Urine: 171.7 mg/dL
Microalb/Creat Ratio: 5 mg/g{creat} (ref 0–29)
Microalbumin, Urine: 8.9 ug/mL

## 2023-05-19 LAB — CBC WITH DIFFERENTIAL/PLATELET
Basophils Absolute: 0.1 10*3/uL (ref 0.0–0.2)
Basos: 1 %
EOS (ABSOLUTE): 0.2 10*3/uL (ref 0.0–0.4)
Eos: 4 %
Hematocrit: 43 % (ref 34.0–46.6)
Hemoglobin: 14.4 g/dL (ref 11.1–15.9)
Immature Grans (Abs): 0 10*3/uL (ref 0.0–0.1)
Immature Granulocytes: 0 %
Lymphocytes Absolute: 2.7 10*3/uL (ref 0.7–3.1)
Lymphs: 42 %
MCH: 30.4 pg (ref 26.6–33.0)
MCHC: 33.5 g/dL (ref 31.5–35.7)
MCV: 91 fL (ref 79–97)
Monocytes Absolute: 0.7 10*3/uL (ref 0.1–0.9)
Monocytes: 11 %
Neutrophils Absolute: 2.7 10*3/uL (ref 1.4–7.0)
Neutrophils: 42 %
Platelets: 276 10*3/uL (ref 150–450)
RBC: 4.74 x10E6/uL (ref 3.77–5.28)
RDW: 12.1 % (ref 11.7–15.4)
WBC: 6.5 10*3/uL (ref 3.4–10.8)

## 2023-05-19 LAB — TSH: TSH: 2.26 u[IU]/mL (ref 0.450–4.500)

## 2023-05-19 LAB — HEMOGLOBIN A1C
Est. average glucose Bld gHb Est-mCnc: 123 mg/dL
Hgb A1c MFr Bld: 5.9 % — ABNORMAL HIGH (ref 4.8–5.6)

## 2023-05-21 ENCOUNTER — Encounter: Payer: Self-pay | Admitting: Family Medicine

## 2023-05-21 ENCOUNTER — Ambulatory Visit (INDEPENDENT_AMBULATORY_CARE_PROVIDER_SITE_OTHER): Payer: Medicare Other | Admitting: Family Medicine

## 2023-05-21 ENCOUNTER — Encounter: Payer: Self-pay | Admitting: Internal Medicine

## 2023-05-21 VITALS — BP 140/78 | HR 84 | Ht 67.0 in | Wt 230.0 lb

## 2023-05-21 DIAGNOSIS — M7672 Peroneal tendinitis, left leg: Secondary | ICD-10-CM | POA: Diagnosis not present

## 2023-05-21 DIAGNOSIS — M1711 Unilateral primary osteoarthritis, right knee: Secondary | ICD-10-CM

## 2023-05-21 DIAGNOSIS — M25551 Pain in right hip: Secondary | ICD-10-CM

## 2023-05-21 DIAGNOSIS — M25562 Pain in left knee: Secondary | ICD-10-CM | POA: Diagnosis not present

## 2023-05-21 DIAGNOSIS — M5416 Radiculopathy, lumbar region: Secondary | ICD-10-CM | POA: Diagnosis not present

## 2023-05-21 DIAGNOSIS — M7631 Iliotibial band syndrome, right leg: Secondary | ICD-10-CM

## 2023-05-21 MED ORDER — PREGABALIN 25 MG PO CAPS
25.0000 mg | ORAL_CAPSULE | Freq: Every evening | ORAL | 0 refills | Status: DC | PRN
Start: 2023-05-21 — End: 2023-06-16

## 2023-05-21 MED ORDER — CELECOXIB 100 MG PO CAPS: 100.0000 mg | ORAL_CAPSULE | Freq: Two times a day (BID) | ORAL | 0 refills | Status: DC | PRN

## 2023-05-21 NOTE — Progress Notes (Signed)
 Primary Care / Sports Medicine Office Visit  Patient Information:  Patient ID: MYYA MEENACH, female DOB: 1955/09/02 Age: 68 y.o. MRN: [id number]   Brandy [name] is a pleasant 68 y.o. female presenting with the following:  Chief Complaint  Patient presents with   Follow-up    Patient presents today fro fo;;ow up after PT, she states he has been going to PT for her left hip, knee, and ankle. PT went very well, she has much more strength and flexibility.     Vitals:   05/21/23 1555  BP: (!) 140/78  Pulse: 84  SpO2: 95%   Vitals:   05/21/23 1555  Weight: 230 lb (104.3 kg)  Height: 5' 7 (1.702 m)   Body mass index is 36.02 kg/m.  CUP PACEART REMOTE DEVICE CHECK Result Date: 04/30/2023 ILR summary report received. Battery status OK. Normal device function. No new symptom, tachy, brady, or pause episodes. No new AF episodes. Monthly summary reports and ROV/PRN. [facility], [facility]    Independent interpretation of notes and tests performed by another provider:   None  Procedures performed:   None  Pertinent History, Exam, Impression, and Recommendations:   Problem List Items Addressed This Visit       Nervous and Auditory   Left lumbar radiculopathy   Reports mixed, but overall positive results from physical therapy and medications. The patient describes the back and buttock pain as a burning sensation that sometimes shoots down the leg, stopping at the knee.   Lumbosacral radicular pain Persistent burning pain in the left posterolateral thigh to the ipsilateral posterior knee. -Continue physical therapy.  New referral placed. -Trial of Lyrica  25 mg (pregabalin ) for neuropathic pain nightly as needed. -Consider nerve health supplements including Omega-3s, Magnesium, and Acetyl L-Carnitine for potential nerve pain management and repair.  List provided via MyChart. -Consider referral to an interventional spine group if pain persists despite interventions.      Relevant  Medications   pregabalin  (LYRICA ) 25 MG capsule   Other Relevant Orders   Ambulatory referral to Physical Therapy     Musculoskeletal and Integument   Greater 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   Relevant Orders   Ambulatory referral to Physical Therapy   It band syndrome, right   Relevant Orders   Ambulatory referral to Physical Therapy   Peroneal tendinitis, left - Primary   The patient also reports occasional flare-ups of pain in the lateral lower leg and ankle, previously noted to be consistent with peroneal tendinitis, describing intermittent instability with walking.  Physical Exam EXTREMITIES: Tender at the base of the fifth metatarsal on the left side. MUSCULOSKELETAL: Inspection with prominent pes cavus.  Tenderness proximal to mid range of the peroneal tendon and at its insertion at the base of the fifth metatarsal on the left side.   Peroneal tendinitis left Patient reports associated foot pain and stiffness, possibly related to degenerative changes in the midfoot noted on prior imaging report from 2015. -Consider referral to podiatry for potential custom orthotics.  Patient will contact us  if wishing to proceed with this. -Take Celebrex  as needed.      Relevant Medications   celecoxib  (CELEBREX ) 100 MG capsule   Other Relevant Orders   Ambulatory referral to Physical Therapy   Primary osteoarthritis of right knee   Relevant Medications   celecoxib  (CELEBREX ) 100 MG capsule   Other Relevant Orders   Ambulatory referral to Physical Therapy     Other   Arthralgia  of left knee   The patient notes that the knee feels unstable with prolonged time on feet and while outdoors, sometimes near buckling, but has improved in strength with PT.   MUSCULOSKELETAL: Medial and lateral joint lines of the knee tender, lateral side slightly more tender. No tenderness at patellar facets. Pain with deep flexion of the knee, localized to anteromedial aspect.  See  additional assessment(s) for plan details.      Relevant Orders   Ambulatory referral to Physical Therapy   I provided a total time of 48 minutes including both face-to-face and non-face-to-face time on 05/21/2023 inclusive of time utilized for medical chart review, information gathering, care coordination with staff, and documentation completion.   Orders & Medications Medications:  Meds ordered this encounter  Medications   pregabalin  (LYRICA ) 25 MG capsule    Sig: Take 1 capsule (25 mg total) by mouth at bedtime as needed.    Dispense:  30 capsule    Refill:  0   celecoxib  (CELEBREX ) 100 MG capsule    Sig: Take 1 capsule (100 mg total) by mouth 2 (two) times daily as needed.    Dispense:  60 capsule    Refill:  0   Orders Placed This Encounter  Procedures   Ambulatory referral to Physical Therapy     No follow-ups on file.     [name], MD, [facility]   Primary Care Sports Medicine Primary Care and Sports Medicine at [facility] [place]

## 2023-05-21 NOTE — Assessment & Plan Note (Signed)
 Reports mixed, but overall positive results from physical therapy and medications. The patient describes the back and buttock pain as a burning sensation that sometimes shoots down the leg, stopping at the knee.   Lumbosacral radicular pain Persistent burning pain in the left posterolateral thigh to the ipsilateral posterior knee. -Continue physical therapy.  New referral placed. -Trial of Lyrica  25 mg (pregabalin ) for neuropathic pain nightly as needed. -Consider nerve health supplements including Omega-3s, Magnesium, and Acetyl L-Carnitine for potential nerve pain management and repair.  List provided via MyChart. -Consider referral to an interventional spine group if pain persists despite interventions.

## 2023-05-21 NOTE — Assessment & Plan Note (Addendum)
 The patient also reports occasional flare-ups of pain in the lateral lower leg and ankle, previously noted to be consistent with peroneal tendinitis, describing intermittent instability with walking.  Physical Exam EXTREMITIES: Tender at the base of the fifth metatarsal on the left side. MUSCULOSKELETAL: Inspection with prominent pes cavus.  Tenderness proximal to mid range of the peroneal tendon and at its insertion at the base of the fifth metatarsal on the left side.   Peroneal tendinitis left Patient reports associated foot pain and stiffness, possibly related to degenerative changes in the midfoot noted on prior imaging report from 2015. -Consider referral to podiatry for potential custom orthotics.  Patient will contact us  if wishing to proceed with this. -Take Celebrex  as needed.

## 2023-05-21 NOTE — Patient Instructions (Addendum)
 Patient Plan for Peroneal Tendonitis, Neuropathic Pain, Inflammation, and Foot Pain:  Peroneal Tendonitis and Neuropathic Pain: - We will continue with your physical therapy regimen to help manage your symptoms. - We are starting a trial of Lyrica  (pregabalin ) nightly as needed for nerve pain relief. - Consider supplements (see below)  - If the pain persists, we may refer you to a spine specialist and/or podiatrist for further evaluation.  Inflammation: - Inflammation is your body's response to injury or irritation, leading to pain and discomfort. - You are currently taking turmeric, which can aid in reducing inflammation. - We discussed the possibility of adding a prescription anti-inflammatory during flare-ups to manage symptoms effectively. - Exploring a low inflammation diet may also help alleviate symptoms and improve overall health.  Foot Pain: - Your foot pain and stiffness may be linked to degenerative changes in the midfoot. -Contact us  if wanting a referral to a podiatrist for an evaluation and to discuss the potential use of custom orthotics. - A prescription anti-inflammatory can be used as needed to manage foot pain.  Supplements for Nerve Health:  Alpha-Lipoic Acid (ALA)   Dose: 600-1,200 mg/day, divided into 2 doses.   Purpose: Reduces oxidative stress and inflammation; supports nerve repair.    Curcumin (Turmeric)   Dose: 500-2,000 mg/day, preferably in bioavailable forms (with black pepper or lipid carriers).   Purpose: Reduces inflammation and nerve pain.    Omega-3 Fatty Acids   Dose: 1,000-3,000 mg/day of combined EPA and DHA.   Purpose: Anti-inflammatory; supports nerve membrane integrity.    B-Complex Vitamins   Dose:   - B1 (Thiamine): 50-100 mg/day.   - B6 (Pyridoxine): 50-100 mg/day (avoid exceeding 200 mg/day to prevent toxicity).   - B12 (Cobalamin): 500-1,000 mcg/day.   Purpose: Supports nerve health and function.    Magnesium   Dose: 200-400  mg/day of elemental magnesium (prefer glycinate or citrate forms).   Purpose: Relieves muscle spasms and stabilizes nerve activity.    Acetyl-L-Carnitine (ALC)   Dose: 1,000-2,000 mg/day, divided into 2 doses.   Purpose: Promotes nerve regeneration and reduces neuropathic pain.

## 2023-05-21 NOTE — Assessment & Plan Note (Signed)
 The patient notes that the knee feels unstable with prolonged time on feet and while outdoors, sometimes near buckling, but has improved in strength with PT.   MUSCULOSKELETAL: Medial and lateral joint lines of the knee tender, lateral side slightly more tender. No tenderness at patellar facets. Pain with deep flexion of the knee, localized to anteromedial aspect.  See additional assessment(s) for plan details.

## 2023-05-25 DIAGNOSIS — M67442 Ganglion, left hand: Secondary | ICD-10-CM | POA: Diagnosis not present

## 2023-06-04 ENCOUNTER — Ambulatory Visit (INDEPENDENT_AMBULATORY_CARE_PROVIDER_SITE_OTHER): Payer: Medicare Other

## 2023-06-04 DIAGNOSIS — I4819 Other persistent atrial fibrillation: Secondary | ICD-10-CM

## 2023-06-04 LAB — CUP PACEART REMOTE DEVICE CHECK
Date Time Interrogation Session: 20250119231811
Implantable Pulse Generator Implant Date: 20230928

## 2023-06-05 NOTE — Progress Notes (Signed)
Care[facility] Summary Report / Loop Recorder 

## 2023-06-08 ENCOUNTER — Ambulatory Visit: Payer: Medicare Other | Attending: Family Medicine

## 2023-06-08 DIAGNOSIS — M5416 Radiculopathy, lumbar region: Secondary | ICD-10-CM | POA: Diagnosis not present

## 2023-06-08 DIAGNOSIS — M6281 Muscle weakness (generalized): Secondary | ICD-10-CM | POA: Diagnosis not present

## 2023-06-08 DIAGNOSIS — M25551 Pain in right hip: Secondary | ICD-10-CM | POA: Diagnosis not present

## 2023-06-08 DIAGNOSIS — M1711 Unilateral primary osteoarthritis, right knee: Secondary | ICD-10-CM | POA: Insufficient documentation

## 2023-06-08 DIAGNOSIS — M25562 Pain in left knee: Secondary | ICD-10-CM | POA: Diagnosis not present

## 2023-06-08 DIAGNOSIS — M7672 Peroneal tendinitis, left leg: Secondary | ICD-10-CM | POA: Insufficient documentation

## 2023-06-08 DIAGNOSIS — M25572 Pain in left ankle and joints of left foot: Secondary | ICD-10-CM | POA: Diagnosis not present

## 2023-06-08 DIAGNOSIS — M7631 Iliotibial band syndrome, right leg: Secondary | ICD-10-CM | POA: Diagnosis not present

## 2023-06-08 NOTE — Therapy (Signed)
OUTPATIENT PHYSICAL THERAPY ANKLE EVALUATION   Patient Name: HASSIE MANDT MRN: [id number] DOB:[birth year], 68 y.o., female Today's Date: 06/08/2023  END OF SESSION:  PT End of Session - 06/08/23 1009     Visit Number 1    Number of Visits 25    Date for PT Re-Evaluation 08/31/23    Authorization Type 06/08/23    PT Start Time 1015    PT Stop Time 1100    PT Time Calculation (min) 45 min    Activity Tolerance Patient tolerated treatment well    Behavior During Therapy [facility] for tasks assessed/performed            Past Medical History:  Diagnosis Date   Acute lower GI bleeding 01/19/2020   Arthritis    Atrial fibrillation with rapid ventricular response (HCC)    Chicken pox    Chronic systolic CHF (congestive heart failure) (HCC)    a. 03/2016 Echo: Ef 15-20%, diff HK, ant AK;  b. 05/2016 Echo: Ef 30-35%, diff HK, mildly dil LA/RA.   Diabetes mellitus without complication (HCC)    Dysrhythmia    GERD (gastroesophageal reflux disease)    Heart murmur    Hip discomfort    been going on for 40 years    History of kidney stones    Hyperlipidemia associated with type 2 diabetes mellitus (HCC) 05/12/2021   Hypertension    Moderate mitral regurgitation    a. 03/2016 Echo: mod MR in setting of LV dysfxn.   Motion sickness    car - back seat   NICM (nonischemic cardiomyopathy) (HCC)    a. 03/2016 Echo: EF 15-20%, diff HK, ant AK, mod MR, mod dil LA, mildly dil RA;  b. 04/2016 Cath: nl cors;  c. 05/2016 Echo: EF 30-35%, diff HK.   Obesity    Obstructive sleep apnea    compliant with CPAP   Persistent atrial fibrillation (HCC)    a. Dx 03/2016;  b. CHA2DS2VASc = 2-->Eliquis 5mg  BID;  b. 05/2016 Failed DCCV x 4.   Sleep apnea    Stomach irritation    Visit for monitoring Tikosyn therapy 07/24/2016   Past Surgical History:  Procedure Laterality Date   ATRIAL FIBRILLATION ABLATION N/A 12/09/2020   Procedure: ATRIAL FIBRILLATION ABLATION;  Surgeon: Lanier Prude, MD;  Location: [facility]  INVASIVE CV [facility];  Service: Cardiovascular;  Laterality: N/A;   BIOPSY N/A 01/12/2020   Procedure: BIOPSY;  Surgeon: Pasty Spillers, MD;  Location: [facility];  Service: Endoscopy;  Laterality: N/A;   CARDIAC CATHETERIZATION N/A 04/17/2016   Procedure: Left Heart Cath and Coronary Angiography;  Surgeon: Iran Ouch, MD;  Location: [facility] INVASIVE CV LAB;  Service: Cardiovascular;  Laterality: N/A;   CARDIOVERSION N/A 10/22/2020   Procedure: CARDIOVERSION;  Surgeon: Yvonne Kendall, MD;  Location: [facility] ORS;  Service: Cardiovascular;  Laterality: N/A;   CHOLECYSTECTOMY     COLON SURGERY  2021   COLONOSCOPY N/A 01/20/2020   Procedure: COLONOSCOPY;  Surgeon: Regis Bill, MD;  Location: [facility] ENDOSCOPY;  Service: Endoscopy;  Laterality: N/A;   COLONOSCOPY WITH PROPOFOL N/A 01/12/2020   Procedure: COLONOSCOPY WITH PROPOFOL;  Surgeon: Pasty Spillers, MD;  Location: [facility];  Service: Endoscopy;  Laterality: N/A;  Diabetic - oral meds sleep apnea   ELECTROPHYSIOLOGIC STUDY N/A 05/26/2016   Procedure: Cardioversion;  Surgeon: Iran Ouch, MD;  Location: [facility] ORS;  Service: Cardiovascular;  Laterality: N/A;   ESOPHAGOGASTRODUODENOSCOPY (EGD) WITH PROPOFOL N/A 01/12/2020   Procedure:  ESOPHAGOGASTRODUODENOSCOPY (EGD) WITH PROPOFOL;  Surgeon: Pasty Spillers, MD;  Location: [facility];  Service: Endoscopy;  Laterality: N/A;   POLYPECTOMY N/A 01/12/2020   Procedure: POLYPECTOMY;  Surgeon: Pasty Spillers, MD;  Location: [facility];  Service: Endoscopy;  Laterality: N/A;   Patient Active Problem List   Diagnosis Date Noted   Mucous cyst of digit of left hand 05/18/2023   Peroneal tendinitis, left 10/13/2022   Left lumbar radiculopathy 07/26/2022   Arthralgia of left knee 07/26/2022   Pes anserinus bursitis of right knee 04/19/2022   Primary osteoarthritis of right knee 03/27/2022   Greater trochanteric pain syndrome of right lower  extremity 03/27/2022   It band syndrome, right 03/27/2022   Hyperlipidemia associated with type 2 diabetes mellitus (HCC) 05/12/2021   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   Varicose veins of leg with pain, bilateral 07/06/2020   Type II diabetes mellitus with complication (HCC) 01/19/2020   Gastric polyp    Colon polyps    Varicose veins of both lower extremities 10/20/2019   Dysphagia 10/20/2019   Osteoarthritis of right hand 10/31/2016   Nonischemic cardiomyopathy (HCC) 04/14/2016   BMI 33.0-33.9,adult 04/14/2016    PCP: Bari Edward, MD  REFERRING PROVIDER: Joseph Berkshire, MD  REFERRING DIAG:  [phone] (ICD-10-CM) - Peroneal tendinitis, left  M54.16 (ICD-10-CM) - Left lumbar radiculopathy  M25.562 (ICD-10-CM) - Arthralgia of left knee  M17.11 (ICD-10-CM) - Primary osteoarthritis of right knee  M76.31 (ICD-10-CM) - It band syndrome, right  M25.551 (ICD-10-CM) - Greater 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   Rationale for Evaluation and Treatment: Rehabilitation  THERAPY DIAG: Pain in left ankle and joints of left foot  Muscle weakness (generalized)  ONSET DATE: Chronic, aggravated over Christmas (December 2024)  FOLLOW-UP APPT SCHEDULED WITH REFERRING PROVIDER: No    SUBJECTIVE:                                                                                                                                                                                         SUBJECTIVE STATEMENT:  Left ankle and foot pain  PERTINENT HISTORY: Pt reports pain in left ankle that seems to have "flared up over Christmas" after pt reported excess walking. Pt reports feeling "pulling and burning at the base of the lateral ankle." She has previously been to PT for pain in her left hip and knee but has now noticed increased pain in the left ankle/foot. Pt expresses difficulty with exercises; specifically pressure on the toes, stretching calves, and rocking on toes. Pt  notes pain is as high as 5/10 NPS but is constant and "never really goes away," with  lowest pain at 1/10 NPS.  PAIN:    Pain Intensity: Present: 1/10, Best: 1/10, Worst: 5/10 Pain location: Lateral foot and ankle Pain Quality: constant and burning  Radiating: Yes  Swelling: No  Popping, catching, locking: No  Numbness/Tingling: No Focal Weakness: Yes Aggravating factors: Touching lateral ankle, moving the ankle, ascending steps when leading with left foot, walking Relieving factors: Stopping activity, resting, brace temporarily relieves some pain 24-hour pain behavior: No night pain when sleeping on back, stretching in AM helps but feels unstable, PM pain depends on activity during the day History of prior back, hip, knee, or ankle injury, pain, surgery, or therapy: Yes; chronic hip, knee, foot pain Dominant hand: right Imaging: No  Typical footwear: Sneakers or rubber boots Red flags: Negative for personal history of cancer, chills/fever, night sweats, nausea, vomiting, unrelenting pain): Negative  PRECAUTIONS: None  WEIGHT BEARING RESTRICTIONS: No  FALLS: Has patient fallen in last 6 months? 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  Recent memory is intact.  Remote memory is intact.  Attention span and  concentration are intact.  Expressive speech is intact.  Patient's fund of knowledge is within normal limits for educational level.    Gross Musculoskeletal Assessment Bulk: Normal Tone: Normal No trophic changes noted to foot/ankle. No ecchymosis, erythema, or edema noted. No gross ankle/foot deformity noted  GAIT: Comments: Mild toe out gait on right foot. Antalgic gait noted on left side.  Posture: No noted discrepancies.  AROM AROM (Normal range in degrees) AROM       Hip Right Left  Flexion (125) WNL WNL  Extension (15) WNL WNL  Abduction (40) WNL WNL  Adduction  WNL WNL  Internal Rotation (45) WNL WNL  External Rotation (45) WNL WNL      Knee    Flexion (135) WNL WNL  Extension (0) WNL WNL      Ankle    Dorsiflexion (20)    Plantarflexion (50) 40 35  Inversion (35) 30 20  Eversion (15) 7 8  (* = pain; Blank rows = not tested)   LE MMT: MMT (out of 5) Right  Left   Hip flexion 4 4  Hip extension 4 4  Hip abduction 4 4+  Hip adduction 4+ 4  Hip internal rotation 5 5  Hip external rotation 5 5  Knee flexion 4 4  Knee extension 5 5  Ankle dorsiflexion 5 5  Ankle plantarflexion    Ankle inversion 5 5*  Ankle eversion 5 5*  (* = pain; Blank rows = not tested)  Sensation Diminished sensation left S1 and lateral aspect of left foot.  Reflexes Deferred  Palpation Location LEFT  RIGHT           Plantar fascia 1 0  Dorsal aspect of foot 1 0  Tarsal tunnel 1 0  (Blank rows = not tested) Graded on  0-4 scale (0 = no pain, 1 = pain, 2 = pain with wincing/grimacing/flinching, 3 = pain with withdrawal, 4 = unwilling to allow palpation), (Blank rows = not tested)  Passive Accessory Motion Superior Tibiofibular Joint: WNL Inferior Tibiofibular Joint: WNL Talocrural Joint Distraction: WNL but noted pain with "pulling feeling" with left foot Talocrural Joint AP: L hypomobile, painful where hand is purchased Talocrural Joint PA: L hypomobile  SPECIAL  TESTS Ligamentous Integrity Anterior Drawer (ATF, 10-15 plantarflexion with anterior translation): Negative Talar Tilt (CFL, inversion): Negative   TODAY'S TREATMENT: DATE: 06/08/23  Deferred   PATIENT EDUCATION:  Education details: HEP and plan of care Person educated: Patient Education method: Explanation, Demonstration, and Tactile cues Education comprehension: verbalized understanding and returned demonstration   HOME EXERCISE PROGRAM:  Access Code: [email or web address] URL: [email or web address] Date: 06/08/2023 Prepared by: Ria Comment  Exercises - Seated Ankle Inversion Eversion PROM  - 1 x daily - 7 x weekly - 10 reps - 15s hold - Gastroc Stretch with Foot at Wall  - 1 x daily - 7 x weekly - 10 reps - 15s hold  ASSESSMENT:  CLINICAL IMPRESSION: Patient is a 68 y.o. female who was seen today for physical therapy evaluation and treatment for left foot and ankle pain. She presents with diffuse pain along lateral and dorsal aspect of the left foot. Currently, her pain is limiting prolonged walking and pt has reported going from "walking 15k steps a day to [facility] steps a day." Joint assessment showed hypomobility in the left talocrural joint. Based on today's performance, she presents with decreased strength and mobility, and increased pain. Pt will benefit from PT in order to improve functional mobility and improve global strengthening.  OBJECTIVE IMPAIRMENTS: decreased strength and hypomobility.   ACTIVITY LIMITATIONS: stairs and locomotion level  PARTICIPATION LIMITATIONS: occupation and yard work  PERSONAL FACTORS: Age, Behavior pattern, Past/current experiences, Time since onset of injury/illness/exacerbation, and 1-2 comorbidities: type II diabetes and left lumbar radiculopathy  are also affecting patient's functional outcome.   REHAB POTENTIAL: Good  CLINICAL DECISION MAKING: Evolving/moderate complexity  EVALUATION COMPLEXITY: Moderate   GOALS: Goals  reviewed with patient? Yes  SHORT TERM GOALS: Target date: 06/29/23  Pt will be independent with HEP to improve strength and decrease ankle pain to improve pain-free function at home and work. Baseline:  Goal status: INITIAL   LONG TERM GOALS: Target date: 08/03/23  1.  Pt will decrease worst ankle pain by at least 3 points on the NPRS in order to demonstrate clinically significant reduction in ankle pain. Baseline: 06/08/23: 1/10 best, 5/10 worst Goal status: INITIAL  2.  Pt will increase FADI score by at least 4 points in order demonstrate clinically significant reduction in ankle pain/disability.       Baseline: To be completed next session. Goal status: INITIAL  3.  Pt will improve strength of ankle inversion/eversion with limited to no pain in order to demonstrate improvement in strength and function.  Baseline: 06/08/23: 5/5 with pain Goal status: INITIAL  4.  Pt will report at least 50% improvement in L ankle and knee symptoms in order to demonstrate improvement in her ability to work in the yard/garden with less pain.  Baseline: Goal status: INITIAL   PLAN: PT FREQUENCY: 1-2x/week  PT DURATION: 12 weeks  PLANNED INTERVENTIONS: Therapeutic exercises, Therapeutic activity, Neuromuscular re-education, Balance training, Gait training, Patient/Family education, Self Care, Joint mobilization, Joint manipulation, Vestibular training, Canalith repositioning, Orthotic/Fit training, DME instructions, Dry Needling, Electrical stimulation, Spinal manipulation, Spinal mobilization,  Cryotherapy, Moist heat, Taping, Traction, Ultrasound, Ionotophoresis 4mg /ml Dexamethasone, Manual therapy, and Re-evaluation.  PLAN FOR NEXT SESSION: Dorsiflexion measurements in WB, progress strengthening and mobility exercises, review and modify HEP as necessary, consider TDN, consider STM to plantar fascia and peroneals, initiate ankle isometrics  Sherri Sear, SPT [employer] Huprich  PT, DPT, GCS  Huprich,Jason, PT 06/08/2023, 12:27 PM

## 2023-06-13 ENCOUNTER — Ambulatory Visit: Payer: Medicare Other

## 2023-06-13 DIAGNOSIS — M25572 Pain in left ankle and joints of left foot: Secondary | ICD-10-CM

## 2023-06-13 DIAGNOSIS — M5416 Radiculopathy, lumbar region: Secondary | ICD-10-CM | POA: Diagnosis not present

## 2023-06-13 DIAGNOSIS — M6281 Muscle weakness (generalized): Secondary | ICD-10-CM | POA: Diagnosis not present

## 2023-06-13 DIAGNOSIS — M25551 Pain in right hip: Secondary | ICD-10-CM | POA: Diagnosis not present

## 2023-06-13 DIAGNOSIS — M7631 Iliotibial band syndrome, right leg: Secondary | ICD-10-CM | POA: Diagnosis not present

## 2023-06-13 DIAGNOSIS — M25562 Pain in left knee: Secondary | ICD-10-CM | POA: Diagnosis not present

## 2023-06-13 DIAGNOSIS — M1711 Unilateral primary osteoarthritis, right knee: Secondary | ICD-10-CM | POA: Diagnosis not present

## 2023-06-13 DIAGNOSIS — M7672 Peroneal tendinitis, left leg: Secondary | ICD-10-CM | POA: Diagnosis not present

## 2023-06-13 NOTE — Therapy (Unsigned)
OUTPATIENT PHYSICAL THERAPY ANKLE TREATMENT   Patient Name: Brandy [name] MRN: [id number] DOB:14-Sep-1955, 68 y.o., female Today's Date: 06/14/2023  END OF SESSION:  PT End of Session - 06/13/23 1320     Visit Number 2    Number of Visits 25    Date for PT Re-Evaluation 08/31/23    Authorization Type 06/08/23    PT Start Time 1315    PT Stop Time 1400    PT Time Calculation (min) 45 min    Activity Tolerance Patient tolerated treatment well    Behavior During Therapy [facility] for tasks assessed/performed            Past Medical History:  Diagnosis Date   Acute lower GI bleeding 01/19/2020   Arthritis    Atrial fibrillation with rapid ventricular response (HCC)    Chicken pox    Chronic systolic CHF (congestive heart failure) (HCC)    a. 03/2016 Echo: Ef 15-20%, diff HK, ant AK;  b. 05/2016 Echo: Ef 30-35%, diff HK, mildly dil LA/RA.   Diabetes mellitus without complication (HCC)    Dysrhythmia    GERD (gastroesophageal reflux disease)    Heart murmur    Hip discomfort    been going on for 40 years    History of kidney stones    Hyperlipidemia associated with type 2 diabetes mellitus (HCC) 05/12/2021   Hypertension    Moderate mitral regurgitation    a. 03/2016 Echo: mod MR in setting of LV dysfxn.   Motion sickness    car - back seat   NICM (nonischemic cardiomyopathy) (HCC)    a. 03/2016 Echo: EF 15-20%, diff HK, ant AK, mod MR, mod dil LA, mildly dil RA;  b. 04/2016 Cath: nl cors;  c. 05/2016 Echo: EF 30-35%, diff HK.   Obesity    Obstructive sleep apnea    compliant with CPAP   Persistent atrial fibrillation (HCC)    a. Dx 03/2016;  b. CHA2DS2VASc = 2-->Eliquis 5mg  BID;  b. 05/2016 Failed DCCV x 4.   Sleep apnea    Stomach irritation    Visit for monitoring Tikosyn therapy 07/24/2016   Past Surgical History:  Procedure Laterality Date   ATRIAL FIBRILLATION ABLATION N/A 12/09/2020   Procedure: ATRIAL FIBRILLATION ABLATION;  Surgeon: Lanier Prude, MD;  Location: [facility]  INVASIVE CV LAB;  Service: Cardiovascular;  Laterality: N/A;   BIOPSY N/A 01/12/2020   Procedure: BIOPSY;  Surgeon: Pasty Spillers, MD;  Location: [facility] SUR[facility];  Service: Endoscopy;  Laterality: N/A;   CARDIAC CATHETERIZATION N/A 04/17/2016   Procedure: Left Heart Cath and Coronary Angiography;  Surgeon: Iran Ouch, MD;  Location: [facility] INVASIVE CV LAB;  Service: Cardiovascular;  Laterality: N/A;   CARDIOVERSION N/A 10/22/2020   Procedure: CARDIOVERSION;  Surgeon: Yvonne Kendall, MD;  Location: [facility] ORS;  Service: Cardiovascular;  Laterality: N/A;   CHOLECYSTECTOMY     COLON SURGERY  2021   COLONOSCOPY N/A 01/20/2020   Procedure: COLONOSCOPY;  Surgeon: Regis Bill, MD;  Location: [facility] ENDOSCOPY;  Service: Endoscopy;  Laterality: N/A;   COLONOSCOPY WITH PROPOFOL N/A 01/12/2020   Procedure: COLONOSCOPY WITH PROPOFOL;  Surgeon: Pasty Spillers, MD;  Location: [facility];  Service: Endoscopy;  Laterality: N/A;  Diabetic - oral meds sleep apnea   ELECTROPHYSIOLOGIC STUDY N/A 05/26/2016   Procedure: Cardioversion;  Surgeon: Iran Ouch, MD;  Location: [facility] ORS;  Service: Cardiovascular;  Laterality: N/A;   ESOPHAGOGASTRODUODENOSCOPY (EGD) WITH PROPOFOL N/A 01/12/2020   Procedure:  ESOPHAGOGASTRODUODENOSCOPY (EGD) WITH PROPOFOL;  Surgeon: Pasty Spillers, MD;  Location: [facility];  Service: Endoscopy;  Laterality: N/A;   POLYPECTOMY N/A 01/12/2020   Procedure: POLYPECTOMY;  Surgeon: Pasty Spillers, MD;  Location: [facility] SURGERY CNTR;  Service: Endoscopy;  Laterality: N/A;   Patient Active Problem List   Diagnosis Date Noted   Mucous cyst of digit of left hand 05/18/2023   Peroneal tendinitis, left 10/13/2022   Left lumbar radiculopathy 07/26/2022   Arthralgia of left knee 07/26/2022   Pes anserinus bursitis of right knee 04/19/2022   Primary osteoarthritis of right knee 03/27/2022   Greater trochanteric pain syndrome of right lower  extremity 03/27/2022   It band syndrome, right 03/27/2022   Hyperlipidemia associated with type 2 diabetes mellitus (HCC) 05/12/2021   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   Varicose veins of leg with pain, bilateral 07/06/2020   Type II diabetes mellitus with complication (HCC) 01/19/2020   Gastric polyp    Colon polyps    Varicose veins of both lower extremities 10/20/2019   Dysphagia 10/20/2019   Osteoarthritis of right hand 10/31/2016   Nonischemic cardiomyopathy (HCC) 04/14/2016   BMI 33.0-33.9,adult 04/14/2016    PCP: Bari Edward, MD  REFERRING PROVIDER: Joseph Berkshire, MD  REFERRING DIAG:  [phone] (ICD-10-CM) - Peroneal tendinitis, left  M54.16 (ICD-10-CM) - Left lumbar radiculopathy  M25.562 (ICD-10-CM) - Arthralgia of left knee  M17.11 (ICD-10-CM) - Primary osteoarthritis of right knee  M76.31 (ICD-10-CM) - It band syndrome, right  M25.551 (ICD-10-CM) - Greater 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   Rationale for Evaluation and Treatment: Rehabilitation  THERAPY DIAG: Pain in left ankle and joints of left foot  Muscle weakness (generalized)  ONSET DATE: Chronic, aggravated over Christmas (December 2024)  FOLLOW-UP APPT SCHEDULED WITH REFERRING PROVIDER: No   INITIAL EVALUATION: SUBJECTIVE:                                                                                                                                                                                         SUBJECTIVE STATEMENT:  Left ankle and foot pain  PERTINENT HISTORY: Pt reports pain in left ankle that seems to have "flared up over Christmas" after pt reported excess walking. Pt reports feeling "pulling and burning at the base of the lateral ankle." She has previously been to PT for pain in her left hip and knee but has now noticed increased pain in the left ankle/foot. Pt expresses difficulty with exercises; specifically pressure on the toes, stretching calves, and  rocking on toes. Pt notes pain is as high as 5/10 NPS but is constant and "never really goes away,"  with lowest pain at 1/10 NPS.  PAIN:    Pain Intensity: Present: 1/10, Best: 1/10, Worst: 5/10 Pain location: Lateral foot and ankle Pain Quality: constant and burning  Radiating: Yes  Swelling: No  Popping, catching, locking: No  Numbness/Tingling: No Focal Weakness: Yes Aggravating factors: Touching lateral ankle, moving the ankle, ascending steps when leading with left foot, walking Relieving factors: Stopping activity, resting, brace temporarily relieves some pain 24-hour pain behavior: No night pain when sleeping on back, stretching in AM helps but feels unstable, PM pain depends on activity during the day History of prior back, hip, knee, or ankle injury, pain, surgery, or therapy: Yes; chronic hip, knee, foot pain Dominant hand: right Imaging: No  Typical footwear: Sneakers or rubber boots Red flags: Negative for personal history of cancer, chills/fever, night sweats, nausea, vomiting, unrelenting pain): Negative  PRECAUTIONS: None  WEIGHT BEARING RESTRICTIONS: No  FALLS: Has patient fallen in last 6 months? 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  Recent memory is intact.  Remote memory is intact.   Attention span and concentration are intact.  Expressive speech is intact.  Patient's fund of knowledge is within normal limits for educational level.    Gross Musculoskeletal Assessment Bulk: Normal Tone: Normal No trophic changes noted to foot/ankle. No ecchymosis, erythema, or edema noted. No gross ankle/foot deformity noted  GAIT: Comments: Mild toe out gait on right foot. Antalgic gait noted on left side.  Posture: No noted discrepancies.  AROM AROM (Normal range in degrees) AROM       Hip Right Left  Flexion (125) WNL WNL  Extension (15) WNL WNL  Abduction (40) WNL WNL  Adduction  WNL WNL  Internal Rotation (45) WNL WNL  External Rotation (45) WNL WNL      Knee    Flexion (135) WNL WNL  Extension (0) WNL WNL      Ankle    Dorsiflexion (20) 10 (WB) 10 (WB)  Plantarflexion (50) 40 35  Inversion (35) 30 20  Eversion (15) 7 8  (* = pain; Blank rows = not tested)   LE MMT: MMT (out of 5) Right  Left   Hip flexion 4 4  Hip extension 4 4  Hip abduction 4 4+  Hip adduction 4+ 4  Hip internal rotation 5 5  Hip external rotation 5 5  Knee flexion 4 4  Knee extension 5 5  Ankle dorsiflexion 5 5  Ankle plantarflexion    Ankle inversion 5 5*  Ankle eversion 5 5*  (* = pain; Blank rows = not tested)  Sensation Diminished sensation left S1 and lateral aspect of left foot.  Reflexes Deferred  Palpation Location LEFT  RIGHT           Plantar fascia 1 0  Dorsal aspect of foot 1 0  Tarsal tunnel 1 0  (Blank rows = not  tested) Graded on 0-4 scale (0 = no pain, 1 = pain, 2 = pain with wincing/grimacing/flinching, 3 = pain with withdrawal, 4 = unwilling to allow palpation), (Blank rows = not tested)  Passive Accessory Motion Superior Tibiofibular Joint: WNL Inferior Tibiofibular Joint: WNL Talocrural Joint Distraction: WNL but noted pain with "pulling feeling" with left foot Talocrural Joint AP: L hypomobile, painful where hand is purchased Talocrural Joint  PA: L hypomobile  SPECIAL TESTS Ligamentous Integrity Anterior Drawer (ATF, 10-15 plantarflexion with anterior translation): Negative Talar Tilt (CFL, inversion): Negative   TODAY'S TREATMENT:   Subjective: Pt noticed increased pain and soreness the day after initial evaluation. She has been wearing her brace since ten for extra stability. She notes difficulty with crossing her leg, making some of her HEP difficult. She has been icing for pain relief. No further questions or concerns upon arrival.   TherEx NuStep L1-4 x 9 minutes for warm-up and BLE strengtheing during interval history; Heel raises with UE support x 15 (stopped due to pain in hamstring); Seated resisted PF with blue tband 2 x 20 (with ball between knees to cue movement at ankle and keeping hip stability); Seated resisted inversion with blue tband 2 x 20 (ball between knees); Seated resisted eversion with blue tband 2 x 20 (ball between knees); Supine adductor squeeze with ball x 20; Supine clam with blue tband x 20;  DF in WB 06/13/23: 10 degrees BLE   Manual Therapy Distal hamstring stretch 2 x 45s BLE; Proximal hamstring stretch 2 x 45s BLE; Ankle DF stretch with blue tband and added end range stretch from therapist 4 x 30s;   PATIENT EDUCATION:  Education details: HEP and plan of care Person educated: Patient Education method: Explanation, Demonstration, and Tactile cues Education comprehension: verbalized understanding and returned demonstration   HOME EXERCISE PROGRAM:  Access Code: [email or web address] URL: [email or web address] Date: 06/13/2023 Prepared by: Ria Comment  Exercises - Seated Ankle Eversion with Resistance  - 1 x daily - 7 x weekly - 2 sets - 10 reps - 3s hold - Seated Ankle Inversion with Resistance and Legs Crossed (Mirrored)  - 1 x daily - 7 x weekly - 2 sets - 10 reps - 3s hold - Seated Ankle Plantarflexion with Resistance  - 1 x daily - 7 x weekly - 2 sets - 10 reps - 3s  hold - Long Sitting Calf Stretch with Strap  - 1 x daily - 7 x weekly - 3 reps - 30-45s hold - Standing Gastroc Stretch on Step  - 1 x daily - 7 x weekly - 3 sets - 10 reps - Standing Dorsiflexion Self-Mobilization on Step  - 1 x daily - 7 x weekly - 3 sets - 10 reps - Standing Dorsiflexion Self-Mobilization on Step (Mirrored)  - 1 x daily - 7 x weekly - 3 sets - 10 reps   ASSESSMENT:  CLINICAL IMPRESSION: Introduced strengthening exercises for the ankle. Stopped heel raises due to "burning sensation in left hamstring." Initiated resisted band exercises for ankle mobility and strength. Pt responded well and HEP was updated. Pt will benefit from PT in order to improve functional mobility and improve global strengthening.  OBJECTIVE IMPAIRMENTS: decreased strength and hypomobility.   ACTIVITY LIMITATIONS: stairs and locomotion level  PARTICIPATION LIMITATIONS: occupation and yard work  PERSONAL FACTORS: Age, Behavior pattern, Past/current experiences, Time since onset of injury/illness/exacerbation, and 1-2 comorbidities: type II diabetes and left lumbar radiculopathy  are also affecting patient's functional outcome.   REHAB POTENTIAL:  Good  CLINICAL DECISION MAKING: Evolving/moderate complexity  EVALUATION COMPLEXITY: Moderate   GOALS: Goals reviewed with patient? Yes  SHORT TERM GOALS: Target date: 06/29/23  Pt will be independent with HEP to improve strength and decrease ankle pain to improve pain-free function at home and work. Baseline:  Goal status: INITIAL   LONG TERM GOALS: Target date: 08/03/23  1.  Pt will decrease worst ankle pain by at least 3 points on the NPRS in order to demonstrate clinically significant reduction in ankle pain. Baseline: 06/08/23: 1/10 best, 5/10 worst Goal status: INITIAL  2.  Pt will increase FADI score by at least 4 points in order demonstrate clinically significant reduction in ankle pain/disability.       Baseline: 06/13/23: 65% Goal  status: INITIAL  3.  Pt will improve strength of ankle inversion/eversion with limited to no pain in order to demonstrate improvement in strength and function.  Baseline: 06/08/23: 5/5 with pain Goal status: INITIAL  4.  Pt will report at least 50% improvement in L ankle and knee symptoms in order to demonstrate improvement in her ability to work in the yard/garden with less pain.  Baseline: Goal status: INITIAL   PLAN: PT FREQUENCY: 1-2x/week  PT DURATION: 12 weeks  PLANNED INTERVENTIONS: Therapeutic exercises, Therapeutic activity, Neuromuscular re-education, Balance training, Gait training, Patient/Family education, Self Care, Joint mobilization, Joint manipulation, Vestibular training, Canalith repositioning, Orthotic/Fit training, DME instructions, Dry Needling, Electrical stimulation, Spinal manipulation, Spinal mobilization, Cryotherapy, Moist heat, Taping, Traction, Ultrasound, Ionotophoresis 4mg /ml Dexamethasone, Manual therapy, and Re-evaluation.  PLAN FOR NEXT SESSION: Progress strengthening and mobility exercises, review and modify HEP as necessary, consider TDN, consider STM to plantar fascia and peroneals, initiate ankle isometrics  Sherri Sear, SPT [employer] Huprich PT, DPT, GCS  Huprich,Jason, PT 06/14/2023, 11:33 AM

## 2023-06-16 ENCOUNTER — Other Ambulatory Visit: Payer: Self-pay | Admitting: Family Medicine

## 2023-06-16 DIAGNOSIS — M5416 Radiculopathy, lumbar region: Secondary | ICD-10-CM

## 2023-06-16 DIAGNOSIS — M7672 Peroneal tendinitis, left leg: Secondary | ICD-10-CM

## 2023-06-18 MED ORDER — CELECOXIB 100 MG PO CAPS: 100.0000 mg | ORAL_CAPSULE | Freq: Two times a day (BID) | ORAL | 2 refills | Status: DC | PRN

## 2023-06-18 MED ORDER — PREGABALIN 25 MG PO CAPS: 25.0000 mg | ORAL_CAPSULE | Freq: Every evening | ORAL | 2 refills | Status: DC | PRN

## 2023-06-19 ENCOUNTER — Ambulatory Visit: Payer: Medicare Other | Attending: Family Medicine

## 2023-06-19 DIAGNOSIS — M25572 Pain in left ankle and joints of left foot: Secondary | ICD-10-CM | POA: Insufficient documentation

## 2023-06-19 DIAGNOSIS — M6281 Muscle weakness (generalized): Secondary | ICD-10-CM | POA: Diagnosis not present

## 2023-06-19 NOTE — Therapy (Addendum)
 OUTPATIENT PHYSICAL THERAPY ANKLE TREATMENT  Patient Name: CODI FOLKERTS MRN: [id number] DOB:05-01-1956, 68 y.o., female Today's Date: 06/19/2023  END OF SESSION:  PT End of Session - 06/19/23 0800     Visit Number 3    Number of Visits 25    Date for PT Re-Evaluation 08/31/23    Authorization Type 06/08/23    PT Start Time 0800    PT Stop Time 0845    PT Time Calculation (min) 45 min    Activity Tolerance Patient tolerated treatment well    Behavior During Therapy [facility] for tasks assessed/performed            Past Medical History:  Diagnosis Date   Acute lower GI bleeding 01/19/2020   Arthritis    Atrial fibrillation with rapid ventricular response (HCC)    Chicken pox    Chronic systolic CHF (congestive heart failure) (HCC)    a. 03/2016 Echo: Ef 15-20%, diff HK, ant AK;  b. 05/2016 Echo: Ef 30-35%, diff HK, mildly dil LA/RA.   Diabetes mellitus without complication (HCC)    Dysrhythmia    GERD (gastroesophageal reflux disease)    Heart murmur    Hip discomfort    been going on for 40 years    History of kidney stones    Hyperlipidemia associated with type 2 diabetes mellitus (HCC) 05/12/2021   Hypertension    Moderate mitral regurgitation    a. 03/2016 Echo: mod MR in setting of LV dysfxn.   Motion sickness    car - back seat   NICM (nonischemic cardiomyopathy) (HCC)    a. 03/2016 Echo: EF 15-20%, diff HK, ant AK, mod MR, mod dil LA, mildly dil RA;  b. 04/2016 Cath: nl cors;  c. 05/2016 Echo: EF 30-35%, diff HK.   Obesity    Obstructive sleep apnea    compliant with CPAP   Persistent atrial fibrillation (HCC)    a. Dx 03/2016;  b. CHA2DS2VASc = 2-->Eliquis  5mg  BID;  b. 05/2016 Failed DCCV x 4.   Sleep apnea    Stomach irritation    Visit for monitoring Tikosyn  therapy 07/24/2016   Past Surgical History:  Procedure Laterality Date   ATRIAL FIBRILLATION ABLATION N/A 12/09/2020   Procedure: ATRIAL FIBRILLATION ABLATION;  Surgeon: Cindie Ole ONEIDA, MD;  Location: [facility]  INVASIVE CV LAB;  Service: Cardiovascular;  Laterality: N/A;   BIOPSY N/A 01/12/2020   Procedure: BIOPSY;  Surgeon: Janalyn Keene NOVAK, MD;  Location: [facility];  Service: Endoscopy;  Laterality: N/A;   CARDIAC CATHETERIZATION N/A 04/17/2016   Procedure: Left Heart Cath and Coronary Angiography;  Surgeon: Deatrice DELENA Cage, MD;  Location: [facility] INVASIVE CV LAB;  Service: Cardiovascular;  Laterality: N/A;   CARDIOVERSION N/A 10/22/2020   Procedure: CARDIOVERSION;  Surgeon: Mady Bruckner, MD;  Location: [facility] ORS;  Service: Cardiovascular;  Laterality: N/A;   CHOLECYSTECTOMY     COLON SURGERY  2021   COLONOSCOPY N/A 01/20/2020   Procedure: COLONOSCOPY;  Surgeon: Maryruth Ole ONEIDA, MD;  Location: [facility] ENDOSCOPY;  Service: Endoscopy;  Laterality: N/A;   COLONOSCOPY WITH PROPOFOL  N/A 01/12/2020   Procedure: COLONOSCOPY WITH PROPOFOL ;  Surgeon: Janalyn Keene NOVAK, MD;  Location: [facility];  Service: Endoscopy;  Laterality: N/A;  Diabetic - oral meds sleep apnea   ELECTROPHYSIOLOGIC STUDY N/A 05/26/2016   Procedure: Cardioversion;  Surgeon: Deatrice DELENA Cage, MD;  Location: [facility] ORS;  Service: Cardiovascular;  Laterality: N/A;   ESOPHAGOGASTRODUODENOSCOPY (EGD) WITH PROPOFOL  N/A 01/12/2020   Procedure: ESOPHAGOGASTRODUODENOSCOPY (  EGD) WITH PROPOFOL ;  Surgeon: Janalyn Keene NOVAK, MD;  Location: [facility];  Service: Endoscopy;  Laterality: N/A;   POLYPECTOMY N/A 01/12/2020   Procedure: POLYPECTOMY;  Surgeon: Janalyn Keene NOVAK, MD;  Location: [facility];  Service: Endoscopy;  Laterality: N/A;   Patient Active Problem List   Diagnosis Date Noted   Mucous cyst of digit of left hand 05/18/2023   Peroneal tendinitis, left 10/13/2022   Left lumbar radiculopathy 07/26/2022   Arthralgia of left knee 07/26/2022   Pes anserinus bursitis of right knee 04/19/2022   Primary osteoarthritis of right knee 03/27/2022   Greater trochanteric pain syndrome of right lower  extremity 03/27/2022   It band syndrome, right 03/27/2022   Hyperlipidemia associated with type 2 diabetes mellitus (HCC) 05/12/2021   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   Varicose veins of leg with pain, bilateral 07/06/2020   Type II diabetes mellitus with complication (HCC) 01/19/2020   Gastric polyp    Colon polyps    Varicose veins of both lower extremities 10/20/2019   Dysphagia 10/20/2019   Osteoarthritis of right hand 10/31/2016   Nonischemic cardiomyopathy (HCC) 04/14/2016   BMI 33.0-33.9,adult 04/14/2016    PCP: Leita Adie, MD  REFERRING PROVIDER: Selinda Ku, MD  REFERRING DIAG:  [phone] (ICD-10-CM) - Peroneal tendinitis, left  M54.16 (ICD-10-CM) - Left lumbar radiculopathy  M25.562 (ICD-10-CM) - Arthralgia of left knee  M17.11 (ICD-10-CM) - Primary osteoarthritis of right knee  M76.31 (ICD-10-CM) - It band syndrome, right  M25.551 (ICD-10-CM) - Greater 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   Rationale for Evaluation and Treatment: Rehabilitation  THERAPY DIAG: Pain in left ankle and joints of left foot  Muscle weakness (generalized)  ONSET DATE: Chronic, aggravated over Christmas (December 2024)  FOLLOW-UP APPT SCHEDULED WITH REFERRING PROVIDER: No   INITIAL EVALUATION: SUBJECTIVE:                                                                                                                                                                                         SUBJECTIVE STATEMENT:  Left ankle and foot pain  PERTINENT HISTORY: Pt reports pain in left ankle that seems to have flared up over Christmas after pt reported excess walking. Pt reports feeling pulling and burning at the base of the lateral ankle. She has previously been to PT for pain in her left hip and knee but has now noticed increased pain in the left ankle/foot. Pt expresses difficulty with exercises; specifically pressure on the toes, stretching calves, and  rocking on toes. Pt notes pain is as high as 5/10 NPS but is constant and never really goes away, with  lowest pain at 1/10 NPS.  PAIN:    Pain Intensity: Present: 1/10, Best: 1/10, Worst: 5/10 Pain location: Lateral foot and ankle Pain Quality: constant and burning  Radiating: Yes  Swelling: No  Popping, catching, locking: No  Numbness/Tingling: No Focal Weakness: Yes Aggravating factors: Touching lateral ankle, moving the ankle, ascending steps when leading with left foot, walking Relieving factors: Stopping activity, resting, brace temporarily relieves some pain 24-hour pain behavior: No night pain when sleeping on back, stretching in AM helps but feels unstable, PM pain depends on activity during the day History of prior back, hip, knee, or ankle injury, pain, surgery, or therapy: Yes; chronic hip, knee, foot pain Dominant hand: right Imaging: No  Typical footwear: Sneakers or rubber boots Red flags: Negative for personal history of cancer, chills/fever, night sweats, nausea, vomiting, unrelenting pain): Negative  PRECAUTIONS: None  WEIGHT BEARING RESTRICTIONS: No  FALLS: Has patient fallen in last 6 months? 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    OBJECTIVE:   Patient Surveys  Deferred  Cognition Patient is oriented to person, place, and time.  Recent memory is intact.  Remote memory is intact.   Attention span and concentration are intact.  Expressive speech is intact.  Patient's fund of knowledge is within normal limits for educational level.    Gross Musculoskeletal Assessment Bulk: Normal Tone: Normal No trophic changes noted to foot/ankle. No ecchymosis, erythema, or edema noted. No gross ankle/foot deformity noted  GAIT: Comments: Mild toe out gait on right foot. Antalgic gait noted on left side.  Posture: No noted discrepancies.  AROM AROM (Normal range in degrees) AROM       Hip Right Left  Flexion (125) WNL WNL  Extension (15) WNL WNL  Abduction (40) WNL WNL  Adduction  WNL WNL  Internal Rotation (45) WNL WNL  External Rotation (45) WNL WNL      Knee    Flexion (135) WNL WNL  Extension (0) WNL WNL      Ankle    Dorsiflexion (20) 10 (WB) 10 (WB)  Plantarflexion (50) 40 35  Inversion (35) 30 20  Eversion (15) 7 8  (* = pain; Blank rows = not tested)   LE MMT: MMT (out of 5) Right  Left   Hip flexion 4 4  Hip extension 4 4  Hip abduction 4 4+  Hip adduction 4+ 4  Hip internal rotation 5 5  Hip external rotation 5 5  Knee flexion 4 4  Knee extension 5 5  Ankle dorsiflexion 5 5  Ankle plantarflexion    Ankle inversion 5 5*  Ankle eversion 5 5*  (* = pain; Blank rows = not tested)  Sensation Diminished sensation left S1 and lateral aspect of left foot.  Reflexes Deferred  Palpation Location LEFT  RIGHT           Plantar fascia 1 0  Dorsal aspect of foot 1 0  Tarsal tunnel 1 0  (Blank rows = not tested)  Graded on 0-4 scale (0 = no pain, 1 = pain, 2 = pain with wincing/grimacing/flinching, 3 = pain with withdrawal, 4 = unwilling to allow palpation), (Blank rows = not tested)  Passive Accessory Motion Superior Tibiofibular Joint: WNL Inferior Tibiofibular Joint: WNL Talocrural Joint Distraction: WNL but noted pain with pulling feeling with left foot Talocrural Joint AP: L hypomobile, painful where hand is purchased Talocrural Joint  PA: L hypomobile  SPECIAL TESTS Ligamentous Integrity Anterior Drawer (ATF, 10-15 plantarflexion with anterior translation): Negative Talar Tilt (CFL, inversion): Negative   TODAY'S TREATMENT:   Subjective: Pt noted that she is doing well today. She had some increased pain from excess walking while shopping over the weekend. She wore her brace while shopping but has not otherwise. Pt has been icing for pain relief. No further questions or concerns upon arrival.   TherEx NuStep L1-4 x 8 minutes for warm-up and BLE strengtheing during interval history; Ankle pumps 2 x 30s; Seated resisted PF with blue tband x 20; Seated resisted inversion with blue tband 2 x 20; Seated resisted eversion with blue tband 2 x 20; Seated ankle circumduction x 45s each direction; Supine isometric DF 3 x 20s; Supine isometric PF 3 x 20s; Supine adductor squeeze with ball 2 x 20; Supine clam with manual resistance x 20;   Manual Therapy Distal hamstring stretch 2 x 45s BLE; Proximal hamstring stretch 2 x 45s BLE; Ankle DF stretch 4 x 30s;   Not today: Heel raises with UE support x 15 (stopped due to pain in hamstring); DF in WB 06/13/23: 10 degrees BLE   PATIENT EDUCATION:  Education details: HEP and plan of care Person educated: Patient Education method: Explanation, Demonstration, and Tactile cues Education comprehension: verbalized understanding and returned demonstration   HOME EXERCISE PROGRAM:  Access Code: [email or web address] URL: [email or web address] Date: 06/13/2023 Prepared by: Selinda Eck  Exercises - Seated Ankle Eversion with Resistance  - 1 x daily - 7 x weekly - 2 sets - 10 reps - 3s hold - Seated Ankle Inversion with Resistance and Legs Crossed (Mirrored)  - 1 x daily - 7 x weekly - 2 sets - 10 reps - 3s hold - Seated Ankle Plantarflexion with Resistance  - 1 x daily - 7 x weekly - 2 sets - 10 reps - 3s hold - Long Sitting Calf Stretch with Strap  - 1 x daily - 7 x  weekly - 3 reps - 30-45s hold - Standing Gastroc Stretch on Step  - 1 x daily - 7 x weekly - 3 sets - 10 reps - Standing Dorsiflexion Self-Mobilization on Step  - 1 x daily - 7 x weekly - 3 sets - 10 reps - Standing Dorsiflexion Self-Mobilization on Step (Mirrored)  - 1 x daily - 7 x weekly - 3 sets - 10 reps   ASSESSMENT:  CLINICAL IMPRESSION: Continued strengthening exercises for the ankle and resisted band exercises for ankle mobility and strength. She still has some pain with exercises in WB positions so primary focus was seated and in supine. Pt responded well and HEP was updated. Pt will benefit from PT in order to improve functional mobility and improve global strengthening.  OBJECTIVE IMPAIRMENTS: decreased strength and hypomobility.   ACTIVITY LIMITATIONS: stairs and locomotion level  PARTICIPATION LIMITATIONS: occupation and yard work  PERSONAL FACTORS: Age, Behavior pattern, Past/current experiences, Time since onset of injury/illness/exacerbation, and 1-2 comorbidities: type II diabetes and left lumbar radiculopathy  are also affecting patient's functional outcome.   REHAB  POTENTIAL: Good  CLINICAL DECISION MAKING: Evolving/moderate complexity  EVALUATION COMPLEXITY: Moderate   GOALS: Goals reviewed with patient? Yes  SHORT TERM GOALS: Target date: 06/29/23  Pt will be independent with HEP to improve strength and decrease ankle pain to improve pain-free function at home and work. Baseline:  Goal status: INITIAL   LONG TERM GOALS: Target date: 08/03/23  1.  Pt will decrease worst ankle pain by at least 3 points on the NPRS in order to demonstrate clinically significant reduction in ankle pain. Baseline: 06/08/23: 1/10 best, 5/10 worst Goal status: INITIAL  2.  Pt will increase FADI score by at least 4 points in order demonstrate clinically significant reduction in ankle pain/disability.       Baseline: 06/13/23: 65% Goal status: INITIAL  3.  Pt will improve  strength of ankle inversion/eversion with limited to no pain in order to demonstrate improvement in strength and function.  Baseline: 06/08/23: 5/5 with pain Goal status: INITIAL  4.  Pt will report at least 50% improvement in L ankle and knee symptoms in order to demonstrate improvement in her ability to work in the yard/garden with less pain.  Baseline: Goal status: INITIAL   PLAN: PT FREQUENCY: 1-2x/week  PT DURATION: 12 weeks  PLANNED INTERVENTIONS: Therapeutic exercises, Therapeutic activity, Neuromuscular re-education, Balance training, Gait training, Patient/Family education, Self Care, Joint mobilization, Joint manipulation, Vestibular training, Canalith repositioning, Orthotic/Fit training, DME instructions, Dry Needling, Electrical stimulation, Spinal manipulation, Spinal mobilization, Cryotherapy, Moist heat, Taping, Traction, Ultrasound, Ionotophoresis 4mg /ml Dexamethasone , Manual therapy, and Re-evaluation.  PLAN FOR NEXT SESSION: Progress strengthening and mobility exercises, review and modify HEP as necessary, consider TDN, consider STM to plantar fascia and peroneals, initiate ankle isometrics  Rocklyn Mayberry, SPT [employer]   Jason D Huprich PT, DPT, GCS  Huprich,Jason, PT 06/19/2023, 11:15 AM

## 2023-06-26 ENCOUNTER — Ambulatory Visit: Payer: Medicare Other

## 2023-06-26 DIAGNOSIS — M6281 Muscle weakness (generalized): Secondary | ICD-10-CM | POA: Diagnosis not present

## 2023-06-26 DIAGNOSIS — M25572 Pain in left ankle and joints of left foot: Secondary | ICD-10-CM | POA: Diagnosis not present

## 2023-06-26 NOTE — Therapy (Signed)
OUTPATIENT PHYSICAL THERAPY ANKLE TREATMENT  Patient Name: Brandy [name] MRN: [id number] DOB:December 15, 1955, 68 y.o., female Today's Date: 06/26/2023  END OF SESSION:  PT End of Session - 06/26/23 0759     Visit Number 4    Number of Visits 25    Date for PT Re-Evaluation 08/31/23    Authorization Type 06/08/23    PT Start Time 0800    PT Stop Time 0845    PT Time Calculation (min) 45 min    Activity Tolerance Patient tolerated treatment well    Behavior During Therapy [facility] for tasks assessed/performed            Past Medical History:  Diagnosis Date   Acute lower GI bleeding 01/19/2020   Arthritis    Atrial fibrillation with rapid ventricular response (HCC)    Chicken pox    Chronic systolic CHF (congestive heart failure) (HCC)    a. 03/2016 Echo: Ef 15-20%, diff HK, ant AK;  b. 05/2016 Echo: Ef 30-35%, diff HK, mildly dil LA/RA.   Diabetes mellitus without complication (HCC)    Dysrhythmia    GERD (gastroesophageal reflux disease)    Heart murmur    Hip discomfort    been going on for 40 years    History of kidney stones    Hyperlipidemia associated with type 2 diabetes mellitus (HCC) 05/12/2021   Hypertension    Moderate mitral regurgitation    a. 03/2016 Echo: mod MR in setting of LV dysfxn.   Motion sickness    car - back seat   NICM (nonischemic cardiomyopathy) (HCC)    a. 03/2016 Echo: EF 15-20%, diff HK, ant AK, mod MR, mod dil LA, mildly dil RA;  b. 04/2016 Cath: nl cors;  c. 05/2016 Echo: EF 30-35%, diff HK.   Obesity    Obstructive sleep apnea    compliant with CPAP   Persistent atrial fibrillation (HCC)    a. Dx 03/2016;  b. CHA2DS2VASc = 2-->Eliquis 5mg  BID;  b. 05/2016 Failed DCCV x 4.   Sleep apnea    Stomach irritation    Visit for monitoring Tikosyn therapy 07/24/2016   Past Surgical History:  Procedure Laterality Date   ATRIAL FIBRILLATION ABLATION N/A 12/09/2020   Procedure: ATRIAL FIBRILLATION ABLATION;  Surgeon: Lanier Prude, MD;  Location: [facility]  INVASIVE CV LAB;  Service: Cardiovascular;  Laterality: N/A;   BIOPSY N/A 01/12/2020   Procedure: BIOPSY;  Surgeon: Pasty Spillers, MD;  Location: [facility];  Service: Endoscopy;  Laterality: N/A;   CARDIAC CATHETERIZATION N/A 04/17/2016   Procedure: Left Heart Cath and Coronary Angiography;  Surgeon: Iran Ouch, MD;  Location: [facility] INVASIVE CV LAB;  Service: Cardiovascular;  Laterality: N/A;   CARDIOVERSION N/A 10/22/2020   Procedure: CARDIOVERSION;  Surgeon: Yvonne Kendall, MD;  Location: [facility] ORS;  Service: Cardiovascular;  Laterality: N/A;   CHOLECYSTECTOMY     COLON SURGERY  2021   COLONOSCOPY N/A 01/20/2020   Procedure: COLONOSCOPY;  Surgeon: Regis Bill, MD;  Location: [facility] ENDOSCOPY;  Service: Endoscopy;  Laterality: N/A;   COLONOSCOPY WITH PROPOFOL N/A 01/12/2020   Procedure: COLONOSCOPY WITH PROPOFOL;  Surgeon: Pasty Spillers, MD;  Location: [facility];  Service: Endoscopy;  Laterality: N/A;  Diabetic - oral meds sleep apnea   ELECTROPHYSIOLOGIC STUDY N/A 05/26/2016   Procedure: Cardioversion;  Surgeon: Iran Ouch, MD;  Location: [facility] ORS;  Service: Cardiovascular;  Laterality: N/A;   ESOPHAGOGASTRODUODENOSCOPY (EGD) WITH PROPOFOL N/A 01/12/2020   Procedure: ESOPHAGOGASTRODUODENOSCOPY (  EGD) WITH PROPOFOL;  Surgeon: Pasty Spillers, MD;  Location: [facility];  Service: Endoscopy;  Laterality: N/A;   POLYPECTOMY N/A 01/12/2020   Procedure: POLYPECTOMY;  Surgeon: Pasty Spillers, MD;  Location: [facility];  Service: Endoscopy;  Laterality: N/A;   Patient Active Problem List   Diagnosis Date Noted   Mucous cyst of digit of left hand 05/18/2023   Peroneal tendinitis, left 10/13/2022   Left lumbar radiculopathy 07/26/2022   Arthralgia of left knee 07/26/2022   Pes anserinus bursitis of right knee 04/19/2022   Primary osteoarthritis of right knee 03/27/2022   Greater trochanteric pain syndrome of right lower  extremity 03/27/2022   It band syndrome, right 03/27/2022   Hyperlipidemia associated with type 2 diabetes mellitus (HCC) 05/12/2021   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   Varicose veins of leg with pain, bilateral 07/06/2020   Type II diabetes mellitus with complication (HCC) 01/19/2020   Gastric polyp    Colon polyps    Varicose veins of both lower extremities 10/20/2019   Dysphagia 10/20/2019   Osteoarthritis of right hand 10/31/2016   Nonischemic cardiomyopathy (HCC) 04/14/2016   BMI 33.0-33.9,adult 04/14/2016    PCP: Bari Edward, MD  REFERRING PROVIDER: Joseph Berkshire, MD  REFERRING DIAG:  [phone] (ICD-10-CM) - Peroneal tendinitis, left  M54.16 (ICD-10-CM) - Left lumbar radiculopathy  M25.562 (ICD-10-CM) - Arthralgia of left knee  M17.11 (ICD-10-CM) - Primary osteoarthritis of right knee  M76.31 (ICD-10-CM) - It band syndrome, right  M25.551 (ICD-10-CM) - Greater 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   Rationale for Evaluation and Treatment: Rehabilitation  THERAPY DIAG: Pain in left ankle and joints of left foot  Muscle weakness (generalized)  ONSET DATE: Chronic, aggravated over Christmas (December 2024)  FOLLOW-UP APPT SCHEDULED WITH REFERRING PROVIDER: No   INITIAL EVALUATION: SUBJECTIVE:                                                                                                                                                                                         SUBJECTIVE STATEMENT:  Left ankle and foot pain  PERTINENT HISTORY: Pt reports pain in left ankle that seems to have "flared up over Christmas" after pt reported excess walking. Pt reports feeling "pulling and burning at the base of the lateral ankle." She has previously been to PT for pain in her left hip and knee but has now noticed increased pain in the left ankle/foot. Pt expresses difficulty with exercises; specifically pressure on the toes, stretching calves, and  rocking on toes. Pt notes pain is as high as 5/10 NPS but is constant and "never really goes away," with  lowest pain at 1/10 NPS.  PAIN:    Pain Intensity: Present: 1/10, Best: 1/10, Worst: 5/10 Pain location: Lateral foot and ankle Pain Quality: constant and burning  Radiating: Yes  Swelling: No  Popping, catching, locking: No  Numbness/Tingling: No Focal Weakness: Yes Aggravating factors: Touching lateral ankle, moving the ankle, ascending steps when leading with left foot, walking Relieving factors: Stopping activity, resting, brace temporarily relieves some pain 24-hour pain behavior: No night pain when sleeping on back, stretching in AM helps but feels unstable, PM pain depends on activity during the day History of prior back, hip, knee, or ankle injury, pain, surgery, or therapy: Yes; chronic hip, knee, foot pain Dominant hand: right Imaging: No  Typical footwear: Sneakers or rubber boots Red flags: Negative for personal history of cancer, chills/fever, night sweats, nausea, vomiting, unrelenting pain): Negative  PRECAUTIONS: None  WEIGHT BEARING RESTRICTIONS: No  FALLS: Has patient fallen in last 6 months? 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  Recent memory is intact.  Remote memory is intact.   Attention span and concentration are intact.  Expressive speech is intact.  Patient's fund of knowledge is within normal limits for educational level.    Gross Musculoskeletal Assessment Bulk: Normal Tone: Normal No trophic changes noted to foot/ankle. No ecchymosis, erythema, or edema noted. No gross ankle/foot deformity noted  GAIT: Comments: Mild toe out gait on right foot. Antalgic gait noted on left side.  Posture: No noted discrepancies.  AROM AROM (Normal range in degrees) AROM       Hip Right Left  Flexion (125) WNL WNL  Extension (15) WNL WNL  Abduction (40) WNL WNL  Adduction  WNL WNL  Internal Rotation (45) WNL WNL  External Rotation (45) WNL WNL      Knee    Flexion (135) WNL WNL  Extension (0) WNL WNL      Ankle    Dorsiflexion (20) 10 (WB) 10 (WB)  Plantarflexion (50) 40 35  Inversion (35) 30 20  Eversion (15) 7 8  (* = pain; Blank rows = not tested)   LE MMT: MMT (out of 5) Right  Left   Hip flexion 4 4  Hip extension 4 4  Hip abduction 4 4+  Hip adduction 4+ 4  Hip internal rotation 5 5  Hip external rotation 5 5  Knee flexion 4 4  Knee extension 5 5  Ankle dorsiflexion 5 5  Ankle plantarflexion    Ankle inversion 5 5*  Ankle eversion 5 5*  (* = pain; Blank rows = not tested)  Sensation Diminished sensation left S1 and lateral aspect of left foot.  Reflexes Deferred  Palpation Location LEFT  RIGHT           Plantar fascia 1 0  Dorsal aspect of foot 1 0  Tarsal tunnel 1 0  (Blank rows = not tested)  Graded on 0-4 scale (0 = no pain, 1 = pain, 2 = pain with wincing/grimacing/flinching, 3 = pain with withdrawal, 4 = unwilling to allow palpation), (Blank rows = not tested)  Passive Accessory Motion Superior Tibiofibular Joint: WNL Inferior Tibiofibular Joint: WNL Talocrural Joint Distraction: WNL but noted pain with "pulling feeling" with left foot Talocrural Joint AP: L hypomobile, painful where hand is purchased Talocrural Joint  PA: L hypomobile  SPECIAL TESTS Ligamentous Integrity Anterior Drawer (ATF, 10-15 plantarflexion with anterior translation): Negative Talar Tilt (CFL, inversion): Negative   TODAY'S TREATMENT:   Subjective: Pt noted that she is doing well today. She had some increased pain from excess walking while shopping on Saturday. She notes the pain going up the LLE and she "didn't feel like she could make it to the car." Pt has been icing for pain relief. No further questions or concerns upon arrival.   TherEx NuStep L1-4 x 10 minutes for warm-up and BLE strengtheing during interval history; Seated resisted PF with blue tband x 20 BLE; Ankle pumps 2 x 30s BLE; Seated resisted inversion with blue tband 2 x 20 BLE; Seated resisted eversion with blue tband 2 x 20 BLE; Seated ankle circumduction 2 x 30s each direction BLE; Supine isometric DF 3 x 20s BLE; Supine isometric PF 3 x 20s BLE; Supine adductor squeeze with ball 2 x 20; Supine clam with manual resistance 2 x 20;   Manual Therapy Distal hamstring stretch 2 x 45s BLE; Proximal hamstring stretch 2 x 45s BLE; Ankle DF stretch 4 x 30s BLE;   Not today: Heel raises with UE support x 15 (stopped due to pain in hamstring); DF in WB 06/13/23: 10 degrees BLE   PATIENT EDUCATION:  Education details: HEP and plan of care Person educated: Patient Education method: Explanation, Demonstration, and Tactile cues Education comprehension: verbalized understanding and returned demonstration   HOME EXERCISE PROGRAM:  Access Code: [email or web address] URL: [email or web address] Date: 06/13/2023 Prepared by: Ria Comment  Exercises - Seated Ankle Eversion with Resistance  - 1 x daily - 7 x weekly - 2 sets - 10 reps - 3s hold - Seated Ankle Inversion with Resistance and Legs Crossed (Mirrored)  - 1 x daily - 7 x weekly - 2 sets - 10 reps - 3s hold - Seated Ankle Plantarflexion with Resistance  - 1 x daily - 7 x weekly - 2 sets - 10 reps -  3s hold - Long Sitting Calf Stretch with Strap  - 1 x daily - 7 x weekly - 3 reps - 30-45s hold - Standing Gastroc Stretch on Step  - 1 x daily - 7 x weekly - 3 sets - 10 reps - Standing Dorsiflexion Self-Mobilization on Step  - 1 x daily - 7 x weekly - 3 sets - 10 reps - Standing Dorsiflexion Self-Mobilization on Step (Mirrored)  - 1 x daily - 7 x weekly - 3 sets - 10 reps   ASSESSMENT:  CLINICAL IMPRESSION: Continued strengthening exercises for the ankle and resisted band exercises for ankle mobility and strength. She still has some pain with exercises in WB positions so primary focus was seated and in supine. Pt responded well and HEP was updated. Pt will benefit from PT in order to improve functional mobility and improve global strengthening.  OBJECTIVE IMPAIRMENTS: decreased strength and hypomobility.   ACTIVITY LIMITATIONS: stairs and locomotion level  PARTICIPATION LIMITATIONS: occupation and yard work  PERSONAL FACTORS: Age, Behavior pattern, Past/current experiences, Time since onset of injury/illness/exacerbation, and  1-2 comorbidities: type II diabetes and left lumbar radiculopathy  are also affecting patient's functional outcome.   REHAB POTENTIAL: Good  CLINICAL DECISION MAKING: Evolving/moderate complexity  EVALUATION COMPLEXITY: Moderate   GOALS: Goals reviewed with patient? Yes  SHORT TERM GOALS: Target date: 06/29/23  Pt will be independent with HEP to improve strength and decrease ankle pain to improve pain-free function at home and work. Baseline:  Goal status: INITIAL   LONG TERM GOALS: Target date: 08/03/23  1.  Pt will decrease worst ankle pain by at least 3 points on the NPRS in order to demonstrate clinically significant reduction in ankle pain. Baseline: 06/08/23: 1/10 best, 5/10 worst Goal status: INITIAL  2.  Pt will increase FADI score by at least 4 points in order demonstrate clinically significant reduction in ankle pain/disability.        Baseline: 06/13/23: 65% Goal status: INITIAL  3.  Pt will improve strength of ankle inversion/eversion with limited to no pain in order to demonstrate improvement in strength and function.  Baseline: 06/08/23: 5/5 with pain Goal status: INITIAL  4.  Pt will report at least 50% improvement in L ankle and knee symptoms in order to demonstrate improvement in her ability to work in the yard/garden with less pain.  Baseline: Goal status: INITIAL   PLAN: PT FREQUENCY: 1-2x/week  PT DURATION: 12 weeks  PLANNED INTERVENTIONS: Therapeutic exercises, Therapeutic activity, Neuromuscular re-education, Balance training, Gait training, Patient/Family education, Self Care, Joint mobilization, Joint manipulation, Vestibular training, Canalith repositioning, Orthotic/Fit training, DME instructions, Dry Needling, Electrical stimulation, Spinal manipulation, Spinal mobilization, Cryotherapy, Moist heat, Taping, Traction, Ultrasound, Ionotophoresis 4mg /ml Dexamethasone, Manual therapy, and Re-evaluation.  PLAN FOR NEXT SESSION: Progress strengthening and mobility exercises, review and modify HEP as necessary, consider TDN, consider STM to plantar fascia and peroneals, initiate ankle isometrics  Sherri Sear, SPT [employer] Huprich PT, DPT, GCS  Huprich,Jason, PT 06/26/2023, 2:31 PM

## 2023-07-03 ENCOUNTER — Ambulatory Visit: Payer: Medicare Other

## 2023-07-03 DIAGNOSIS — M6281 Muscle weakness (generalized): Secondary | ICD-10-CM

## 2023-07-03 DIAGNOSIS — M25572 Pain in left ankle and joints of left foot: Secondary | ICD-10-CM | POA: Diagnosis not present

## 2023-07-03 NOTE — Therapy (Unsigned)
OUTPATIENT PHYSICAL THERAPY ANKLE TREATMENT  Patient Name: Brandy [name] MRN: [id number] DOB:Oct 03, 1955, 68 y.o., female Today's Date: 07/04/2023  END OF SESSION:  PT End of Session - 07/03/23 0847     Visit Number 5    Number of Visits 25    Date for PT Re-Evaluation 08/31/23    Authorization Type 06/08/23    PT Start Time 0845    PT Stop Time 0930    PT Time Calculation (min) 45 min    Activity Tolerance Patient tolerated treatment well    Behavior During Therapy [facility] for tasks assessed/performed            Past Medical History:  Diagnosis Date   Acute lower GI bleeding 01/19/2020   Arthritis    Atrial fibrillation with rapid ventricular response (HCC)    Chicken pox    Chronic systolic CHF (congestive heart failure) (HCC)    a. 03/2016 Echo: Ef 15-20%, diff HK, ant AK;  b. 05/2016 Echo: Ef 30-35%, diff HK, mildly dil LA/RA.   Diabetes mellitus without complication (HCC)    Dysrhythmia    GERD (gastroesophageal reflux disease)    Heart murmur    Hip discomfort    been going on for 40 years    History of kidney stones    Hyperlipidemia associated with type 2 diabetes mellitus (HCC) 05/12/2021   Hypertension    Moderate mitral regurgitation    a. 03/2016 Echo: mod MR in setting of LV dysfxn.   Motion sickness    car - back seat   NICM (nonischemic cardiomyopathy) (HCC)    a. 03/2016 Echo: EF 15-20%, diff HK, ant AK, mod MR, mod dil LA, mildly dil RA;  b. 04/2016 Cath: nl cors;  c. 05/2016 Echo: EF 30-35%, diff HK.   Obesity    Obstructive sleep apnea    compliant with CPAP   Persistent atrial fibrillation (HCC)    a. Dx 03/2016;  b. CHA2DS2VASc = 2-->Eliquis 5mg  BID;  b. 05/2016 Failed DCCV x 4.   Sleep apnea    Stomach irritation    Visit for monitoring Tikosyn therapy 07/24/2016   Past Surgical History:  Procedure Laterality Date   ATRIAL FIBRILLATION ABLATION N/A 12/09/2020   Procedure: ATRIAL FIBRILLATION ABLATION;  Surgeon: Lanier Prude, MD;  Location: [facility]  INVASIVE CV LAB;  Service: Cardiovascular;  Laterality: N/A;   BIOPSY N/A 01/12/2020   Procedure: BIOPSY;  Surgeon: Pasty Spillers, MD;  Location: [facility];  Service: Endoscopy;  Laterality: N/A;   CARDIAC CATHETERIZATION N/A 04/17/2016   Procedure: Left Heart Cath and Coronary Angiography;  Surgeon: Iran Ouch, MD;  Location: [facility] INVASIVE CV LAB;  Service: Cardiovascular;  Laterality: N/A;   CARDIOVERSION N/A 10/22/2020   Procedure: CARDIOVERSION;  Surgeon: Yvonne Kendall, MD;  Location: [facility] ORS;  Service: Cardiovascular;  Laterality: N/A;   CHOLECYSTECTOMY     COLON SURGERY  2021   COLONOSCOPY N/A 01/20/2020   Procedure: COLONOSCOPY;  Surgeon: Regis Bill, MD;  Location: [facility] ENDOSCOPY;  Service: Endoscopy;  Laterality: N/A;   COLONOSCOPY WITH PROPOFOL N/A 01/12/2020   Procedure: COLONOSCOPY WITH PROPOFOL;  Surgeon: Pasty Spillers, MD;  Location: [facility];  Service: Endoscopy;  Laterality: N/A;  Diabetic - oral meds sleep apnea   ELECTROPHYSIOLOGIC STUDY N/A 05/26/2016   Procedure: Cardioversion;  Surgeon: Iran Ouch, MD;  Location: [facility] ORS;  Service: Cardiovascular;  Laterality: N/A;   ESOPHAGOGASTRODUODENOSCOPY (EGD) WITH PROPOFOL N/A 01/12/2020   Procedure: ESOPHAGOGASTRODUODENOSCOPY (  EGD) WITH PROPOFOL;  Surgeon: Pasty Spillers, MD;  Location: [facility];  Service: Endoscopy;  Laterality: N/A;   POLYPECTOMY N/A 01/12/2020   Procedure: POLYPECTOMY;  Surgeon: Pasty Spillers, MD;  Location: [facility] SUR[facility]ERY [facility];  Service: Endoscopy;  Laterality: N/A;   Patient Active Problem List   Diagnosis Date Noted   Mucous cyst of digit of left hand 05/18/2023   Peroneal tendinitis, left 10/13/2022   Left lumbar radiculopathy 07/26/2022   Arthralgia of left knee 07/26/2022   Pes anserinus bursitis of right knee 04/19/2022   Primary osteoarthritis of right knee 03/27/2022   Greater trochanteric pain syndrome of right lower  extremity 03/27/2022   It band syndrome, right 03/27/2022   Hyperlipidemia associated with type 2 diabetes mellitus (HCC) 05/12/2021   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   Varicose veins of leg with pain, bilateral 07/06/2020   Type II diabetes mellitus with complication (HCC) 01/19/2020   Gastric polyp    Colon polyps    Varicose veins of both lower extremities 10/20/2019   Dysphagia 10/20/2019   Osteoarthritis of right hand 10/31/2016   Nonischemic cardiomyopathy (HCC) 04/14/2016   BMI 33.0-33.9,adult 04/14/2016    PCP: Bari Edward, MD  REFERRING PROVIDER: Joseph Berkshire, MD  REFERRING DIAG:  [phone] (ICD-10-CM) - Peroneal tendinitis, left  M54.16 (ICD-10-CM) - Left lumbar radiculopathy  M25.562 (ICD-10-CM) - Arthralgia of left knee  M17.11 (ICD-10-CM) - Primary osteoarthritis of right knee  M76.31 (ICD-10-CM) - It band syndrome, right  M25.551 (ICD-10-CM) - Greater 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   Rationale for Evaluation and Treatment: Rehabilitation  THERAPY DIAG: Pain in left ankle and joints of left foot  Muscle weakness (generalized)  ONSET DATE: Chronic, aggravated over Christmas (December 2024)  FOLLOW-UP APPT SCHEDULED WITH REFERRING PROVIDER: No   INITIAL EVALUATION: SUBJECTIVE:                                                                                                                                                                                         SUBJECTIVE STATEMENT:  Left ankle and foot pain  PERTINENT HISTORY: Pt reports pain in left ankle that seems to have "flared up over Christmas" after pt reported excess walking. Pt reports feeling "pulling and burning at the base of the lateral ankle." She has previously been to PT for pain in her left hip and knee but has now noticed increased pain in the left ankle/foot. Pt expresses difficulty with exercises; specifically pressure on the toes, stretching calves, and  rocking on toes. Pt notes pain is as high as 5/10 NPS but is constant and "never really goes away," with  lowest pain at 1/10 NPS.  PAIN:    Pain Intensity: Present: 1/10, Best: 1/10, Worst: 5/10 Pain location: Lateral foot and ankle Pain Quality: constant and burning  Radiating: Yes  Swelling: No  Popping, catching, locking: No  Numbness/Tingling: No Focal Weakness: Yes Aggravating factors: Touching lateral ankle, moving the ankle, ascending steps when leading with left foot, walking Relieving factors: Stopping activity, resting, brace temporarily relieves some pain 24-hour pain behavior: No night pain when sleeping on back, stretching in AM helps but feels unstable, PM pain depends on activity during the day History of prior back, hip, knee, or ankle injury, pain, surgery, or therapy: Yes; chronic hip, knee, foot pain Dominant hand: right Imaging: No  Typical footwear: Sneakers or rubber boots Red flags: Negative for personal history of cancer, chills/fever, night sweats, nausea, vomiting, unrelenting pain): Negative  PRECAUTIONS: None  WEIGHT BEARING RESTRICTIONS: No  FALLS: Has patient fallen in last 6 months? 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  Recent memory is intact.  Remote memory is intact.   Attention span and concentration are intact.  Expressive speech is intact.  Patient's fund of knowledge is within normal limits for educational level.    Gross Musculoskeletal Assessment Bulk: Normal Tone: Normal No trophic changes noted to foot/ankle. No ecchymosis, erythema, or edema noted. No gross ankle/foot deformity noted  GAIT: Comments: Mild toe out gait on right foot. Antalgic gait noted on left side.  Posture: No noted discrepancies.  AROM AROM (Normal range in degrees) AROM       Hip Right Left  Flexion (125) WNL WNL  Extension (15) WNL WNL  Abduction (40) WNL WNL  Adduction  WNL WNL  Internal Rotation (45) WNL WNL  External Rotation (45) WNL WNL      Knee    Flexion (135) WNL WNL  Extension (0) WNL WNL      Ankle    Dorsiflexion (20) 10 (WB) 10 (WB)  Plantarflexion (50) 40 35  Inversion (35) 30 20  Eversion (15) 7 8  (* = pain; Blank rows = not tested)   LE MMT: MMT (out of 5) Right  Left   Hip flexion 4 4  Hip extension 4 4  Hip abduction 4 4+  Hip adduction 4+ 4  Hip internal rotation 5 5  Hip external rotation 5 5  Knee flexion 4 4  Knee extension 5 5  Ankle dorsiflexion 5 5  Ankle plantarflexion    Ankle inversion 5 5*  Ankle eversion 5 5*  (* = pain; Blank rows = not tested)  Sensation Diminished sensation left S1 and lateral aspect of left foot.  Reflexes Deferred  Palpation Location LEFT  RIGHT           Plantar fascia 1 0  Dorsal aspect of foot 1 0  Tarsal tunnel 1 0  (Blank rows = not tested)  Graded on 0-4 scale (0 = no pain, 1 = pain, 2 = pain with wincing/grimacing/flinching, 3 = pain with withdrawal, 4 = unwilling to allow palpation), (Blank rows = not tested)  Passive Accessory Motion Superior Tibiofibular Joint: WNL Inferior Tibiofibular Joint: WNL Talocrural Joint Distraction: WNL but noted pain with "pulling feeling" with left foot Talocrural Joint AP: L hypomobile, painful where hand is purchased Talocrural Joint  PA: L hypomobile  SPECIAL TESTS Ligamentous Integrity Anterior Drawer (ATF, 10-15 plantarflexion with anterior translation): Negative Talar Tilt (CFL, inversion): Negative   TODAY'S TREATMENT:   Subjective: Pt noted that she is doing well today. She had mild pain on Sunday when ascending/descending the stairs, but overall pain has been manageable. No further questions or concerns upon arrival.   TherEx NuStep L1-4 x 10 minutes for warm-up and BLE strengtheing during interval history; Seated resisted PF with green tband x 20 BLE; Seated resisted inversion with green tband x 20 BLE; Seated resisted eversion with green tband x 20 BLE; Hooklying bridges x 15; Supine isometric DF 3 x 20s BLE; Supine isometric PF 3 x 20s BLE; Supine adductor squeeze with ball 2 x 15; Supine clam with manual resistance 2 x 15; Seated heel raises x 20;   Manual Therapy Distal hamstring stretch x 45s BLE; Proximal hamstring stretch x 45s BLE; Ankle DF stretch 3 x 30s BLE;   Not today: Heel raises with UE support x 15 (stopped due to pain in hamstring); DF in WB 06/13/23: 10 degrees BLE Ankle pumps 2 x 30s BLE; Seated ankle circumduction 2 x 30s each direction BLE;   PATIENT EDUCATION:  Education details: HEP and plan of care Person educated: Patient Education method: Explanation, Demonstration, and Tactile cues Education comprehension: verbalized understanding and returned demonstration   HOME EXERCISE PROGRAM:  Access Code: [email or web address] URL: [email or web address] Date: 06/13/2023 Prepared by: Ria Comment  Exercises - Seated Ankle Eversion with Resistance  - 1 x daily - 7 x weekly - 2 sets - 10 reps - 3s hold - Seated Ankle Inversion with Resistance and Legs Crossed (Mirrored)  - 1 x daily - 7 x weekly - 2 sets - 10 reps - 3s hold - Seated Ankle Plantarflexion with Resistance  - 1 x daily - 7 x weekly - 2 sets - 10 reps - 3s hold - Long Sitting Calf Stretch with Strap  - 1 x  daily - 7 x weekly - 3 reps - 30-45s hold - Standing Gastroc Stretch on Step  - 1 x daily - 7 x weekly - 3 sets - 10 reps - Standing Dorsiflexion Self-Mobilization on Step  - 1 x daily - 7 x weekly - 3 sets - 10 reps - Standing Dorsiflexion Self-Mobilization on Step (Mirrored)  - 1 x daily - 7 x weekly - 3 sets - 10 reps   ASSESSMENT:  CLINICAL IMPRESSION: Continued strengthening exercises for the ankle and resisted band exercises for ankle mobility and strength. She still has some pain with exercises in WB positions so primary focus was seated and in supine. No HEP modifications at this time. Pt will benefit from PT in order to improve functional mobility and improve global strengthening.  OBJECTIVE IMPAIRMENTS: decreased strength and hypomobility.   ACTIVITY LIMITATIONS: stairs and locomotion level  PARTICIPATION LIMITATIONS: occupation and yard work  PERSONAL FACTORS: Age, Behavior pattern, Past/current experiences, Time since onset of injury/illness/exacerbation, and 1-2 comorbidities: type II diabetes and left lumbar radiculopathy  are also affecting patient's functional outcome.   REHAB  POTENTIAL: Good  CLINICAL DECISION MAKING: Evolving/moderate complexity  EVALUATION COMPLEXITY: Moderate   GOALS: Goals reviewed with patient? Yes  SHORT TERM GOALS: Target date: 06/29/23  Pt will be independent with HEP to improve strength and decrease ankle pain to improve pain-free function at home and work. Baseline:  Goal status: INITIAL   LONG TERM GOALS: Target date: 08/03/23  1.  Pt will decrease worst ankle pain by at least 3 points on the NPRS in order to demonstrate clinically significant reduction in ankle pain. Baseline: 06/08/23: 1/10 best, 5/10 worst Goal status: INITIAL  2.  Pt will increase FADI score by at least 4 points in order demonstrate clinically significant reduction in ankle pain/disability.       Baseline: 06/13/23: 65% Goal status: INITIAL  3.  Pt will  improve strength of ankle inversion/eversion with limited to no pain in order to demonstrate improvement in strength and function.  Baseline: 06/08/23: 5/5 with pain Goal status: INITIAL  4.  Pt will report at least 50% improvement in L ankle and knee symptoms in order to demonstrate improvement in her ability to work in the yard/garden with less pain.  Baseline: Goal status: INITIAL   PLAN: PT FREQUENCY: 1-2x/week  PT DURATION: 12 weeks  PLANNED INTERVENTIONS: Therapeutic exercises, Therapeutic activity, Neuromuscular re-education, Balance training, Gait training, Patient/Family education, Self Care, Joint mobilization, Joint manipulation, Vestibular training, Canalith repositioning, Orthotic/Fit training, DME instructions, Dry Needling, Electrical stimulation, Spinal manipulation, Spinal mobilization, Cryotherapy, Moist heat, Taping, Traction, Ultrasound, Ionotophoresis 4mg /ml Dexamethasone, Manual therapy, and Re-evaluation.  PLAN FOR NEXT SESSION: Progress strengthening and mobility exercises, review and modify HEP as necessary, consider TDN, consider STM to plantar fascia and peroneals, initiate ankle isometrics  Sherri Sear, SPT [employer] Huprich PT, DPT, GCS  Huprich,Jason, PT 07/04/2023, 10:47 AM

## 2023-07-09 ENCOUNTER — Ambulatory Visit (INDEPENDENT_AMBULATORY_CARE_PROVIDER_SITE_OTHER): Payer: Medicare Other

## 2023-07-09 DIAGNOSIS — I4819 Other persistent atrial fibrillation: Secondary | ICD-10-CM

## 2023-07-10 ENCOUNTER — Ambulatory Visit: Payer: Medicare Other

## 2023-07-11 ENCOUNTER — Encounter: Payer: Self-pay | Admitting: Internal Medicine

## 2023-07-11 LAB — CUP PACEART REMOTE DEVICE CHECK
Date Time Interrogation Session: 20250223231524
Implantable Pulse Generator Implant Date: 20230928

## 2023-07-12 NOTE — Progress Notes (Signed)
 [facility] Summary Report / Loop Recorder

## 2023-07-17 ENCOUNTER — Ambulatory Visit: Payer: Medicare Other | Attending: Family Medicine

## 2023-07-17 DIAGNOSIS — G8929 Other chronic pain: Secondary | ICD-10-CM | POA: Insufficient documentation

## 2023-07-17 DIAGNOSIS — M25572 Pain in left ankle and joints of left foot: Secondary | ICD-10-CM | POA: Diagnosis not present

## 2023-07-17 DIAGNOSIS — M25562 Pain in left knee: Secondary | ICD-10-CM | POA: Diagnosis not present

## 2023-07-17 DIAGNOSIS — M5459 Other low back pain: Secondary | ICD-10-CM | POA: Diagnosis not present

## 2023-07-17 DIAGNOSIS — M6281 Muscle weakness (generalized): Secondary | ICD-10-CM | POA: Insufficient documentation

## 2023-07-17 DIAGNOSIS — R2681 Unsteadiness on feet: Secondary | ICD-10-CM | POA: Insufficient documentation

## 2023-07-17 DIAGNOSIS — R262 Difficulty in walking, not elsewhere classified: Secondary | ICD-10-CM | POA: Insufficient documentation

## 2023-07-17 NOTE — Therapy (Signed)
 OUTPATIENT PHYSICAL THERAPY ANKLE TREATMENT  Patient Name: Brandy [name] MRN: [id number] DOB:July 04, 1955, 68 y.o., female Today's Date: 07/17/2023  END OF SESSION:  PT End of Session - 07/17/23 0847     Visit Number 6    Number of Visits 25    Date for PT Re-Evaluation 08/31/23    PT Start Time 0847    PT Stop Time 0935    PT Time Calculation (min) 48 min    Activity Tolerance Patient tolerated treatment well    Behavior During Therapy [facility] for tasks assessed/performed             Past Medical History:  Diagnosis Date   Acute lower GI bleeding 01/19/2020   Arthritis    Atrial fibrillation with rapid ventricular response (HCC)    Chicken pox    Chronic systolic CHF (congestive heart failure) (HCC)    a. 03/2016 Echo: Ef 15-20%, diff HK, ant AK;  b. 05/2016 Echo: Ef 30-35%, diff HK, mildly dil LA/RA.   Diabetes mellitus without complication (HCC)    Dysrhythmia    GERD (gastroesophageal reflux disease)    Heart murmur    Hip discomfort    been going on for 40 years    History of kidney stones    Hyperlipidemia associated with type 2 diabetes mellitus (HCC) 05/12/2021   Hypertension    Moderate mitral regurgitation    a. 03/2016 Echo: mod MR in setting of LV dysfxn.   Motion sickness    car - back seat   NICM (nonischemic cardiomyopathy) (HCC)    a. 03/2016 Echo: EF 15-20%, diff HK, ant AK, mod MR, mod dil LA, mildly dil RA;  b. 04/2016 Cath: nl cors;  c. 05/2016 Echo: EF 30-35%, diff HK.   Obesity    Obstructive sleep apnea    compliant with CPAP   Persistent atrial fibrillation (HCC)    a. Dx 03/2016;  b. CHA2DS2VASc = 2-->Eliquis 5mg  BID;  b. 05/2016 Failed DCCV x 4.   Sleep apnea    Stomach irritation    Visit for monitoring Tikosyn therapy 07/24/2016   Past Surgical History:  Procedure Laterality Date   ATRIAL FIBRILLATION ABLATION N/A 12/09/2020   Procedure: ATRIAL FIBRILLATION ABLATION;  Surgeon: Lanier Prude, MD;  Location: [facility];  Service:  Cardiovascular;  Laterality: N/A;   BIOPSY N/A 01/12/2020   Procedure: BIOPSY;  Surgeon: Pasty Spillers, MD;  Location: [facility];  Service: Endoscopy;  Laterality: N/A;   CARDIAC CATHETERIZATION N/A 04/17/2016   Procedure: Left Heart Cath and Coronary Angiography;  Surgeon: Iran Ouch, MD;  Location: [facility] INVASIVE CV LAB;  Service: Cardiovascular;  Laterality: N/A;   CARDIOVERSION N/A 10/22/2020   Procedure: CARDIOVERSION;  Surgeon: Yvonne Kendall, MD;  Location: [facility] ORS;  Service: Cardiovascular;  Laterality: N/A;   CHOLECYSTECTOMY     COLON SURGERY  2021   COLONOSCOPY N/A 01/20/2020   Procedure: COLONOSCOPY;  Surgeon: Regis Bill, MD;  Location: [facility] ENDOSCOPY;  Service: Endoscopy;  Laterality: N/A;   COLONOSCOPY WITH PROPOFOL N/A 01/12/2020   Procedure: COLONOSCOPY WITH PROPOFOL;  Surgeon: Pasty Spillers, MD;  Location: [facility];  Service: Endoscopy;  Laterality: N/A;  Diabetic - oral meds sleep apnea   ELECTROPHYSIOLOGIC STUDY N/A 05/26/2016   Procedure: Cardioversion;  Surgeon: Iran Ouch, MD;  Location: [facility] ORS;  Service: Cardiovascular;  Laterality: N/A;   ESOPHAGOGASTRODUODENOSCOPY (EGD) WITH PROPOFOL N/A 01/12/2020   Procedure: ESOPHAGOGASTRODUODENOSCOPY (EGD) WITH PROPOFOL;  Surgeon:  Pasty Spillers, MD;  Location: [facility];  Service: Endoscopy;  Laterality: N/A;   POLYPECTOMY N/A 01/12/2020   Procedure: POLYPECTOMY;  Surgeon: Pasty Spillers, MD;  Location: [facility];  Service: Endoscopy;  Laterality: N/A;   Patient Active Problem List   Diagnosis Date Noted   Mucous cyst of digit of left hand 05/18/2023   Peroneal tendinitis, left 10/13/2022   Left lumbar radiculopathy 07/26/2022   Arthralgia of left knee 07/26/2022   Pes anserinus bursitis of right knee 04/19/2022   Primary osteoarthritis of right knee 03/27/2022   Greater 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 03/27/2022   It band  syndrome, right 03/27/2022   Hyperlipidemia associated with type 2 diabetes mellitus (HCC) 05/12/2021   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   Varicose veins of leg with pain, bilateral 07/06/2020   Type II diabetes mellitus with complication (HCC) 01/19/2020   Gastric polyp    Colon polyps    Varicose veins of both lower extremities 10/20/2019   Dysphagia 10/20/2019   Osteoarthritis of right hand 10/31/2016   Nonischemic cardiomyopathy (HCC) 04/14/2016   BMI 33.0-33.9,adult 04/14/2016    PCP: Bari Edward, MD  REFERRING PROVIDER: Joseph Berkshire, MD  REFERRING DIAG:  [phone] (ICD-10-CM) - Peroneal tendinitis, left  M54.16 (ICD-10-CM) - Left lumbar radiculopathy  M25.562 (ICD-10-CM) - Arthralgia of left knee  M17.11 (ICD-10-CM) - Primary osteoarthritis of right knee  M76.31 (ICD-10-CM) - It band syndrome, right  M25.551 (ICD-10-CM) - Greater 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   Rationale for Evaluation and Treatment: Rehabilitation  THERAPY DIAG: Difficulty in walking, not elsewhere classified  Muscle weakness (generalized)  Pain in left ankle and joints of left foot  Unsteadiness on feet  ONSET DATE: Chronic, aggravated over Christmas (December 2024)  FOLLOW-UP APPT SCHEDULED WITH REFERRING PROVIDER: No   INITIAL EVALUATION: SUBJECTIVE:                                                                                                                                                                                         SUBJECTIVE STATEMENT:  Left ankle and foot pain  PERTINENT HISTORY: Pt reports pain in left ankle that seems to have "flared up over Christmas" after pt reported excess walking. Pt reports feeling "pulling and burning at the base of the lateral ankle." She has previously been to PT for pain in her left hip and knee but has now noticed increased pain in the left ankle/foot. Pt expresses difficulty with exercises; specifically  pressure on the toes, stretching calves, and rocking on toes. Pt notes pain is as high as 5/10 NPS but is constant  and "never really goes away," with lowest pain at 1/10 NPS.  PAIN:    Pain Intensity: Present: 1/10, Best: 1/10, Worst: 5/10 Pain location: Lateral foot and ankle Pain Quality: constant and burning  Radiating: Yes  Swelling: No  Popping, catching, locking: No  Numbness/Tingling: No Focal Weakness: Yes Aggravating factors: Touching lateral ankle, moving the ankle, ascending steps when leading with left foot, walking Relieving factors: Stopping activity, resting, brace temporarily relieves some pain 24-hour pain behavior: No night pain when sleeping on back, stretching in AM helps but feels unstable, PM pain depends on activity during the day History of prior back, hip, knee, or ankle injury, pain, surgery, or therapy: Yes; chronic hip, knee, foot pain Dominant hand: right Imaging: No  Typical footwear: Sneakers or rubber boots Red flags: Negative for personal history of cancer, chills/fever, night sweats, nausea, vomiting, unrelenting pain): Negative  PRECAUTIONS: None  WEIGHT BEARING RESTRICTIONS: No  FALLS: Has patient fallen in last 6 months? 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  Recent  memory is intact.  Remote memory is intact.  Attention span and concentration are intact.  Expressive speech is intact.  Patient's fund of knowledge is within normal limits for educational level.    Gross Musculoskeletal Assessment Bulk: Normal Tone: Normal No trophic changes noted to foot/ankle. No ecchymosis, erythema, or edema noted. No gross ankle/foot deformity noted  GAIT: Comments: Mild toe out gait on right foot. Antalgic gait noted on left side.  Posture: No noted discrepancies.  AROM AROM (Normal range in degrees) AROM       Hip Right Left  Flexion (125) WNL WNL  Extension (15) WNL WNL  Abduction (40) WNL WNL  Adduction  WNL WNL  Internal Rotation (45) WNL WNL  External Rotation (45) WNL WNL      Knee    Flexion (135) WNL WNL  Extension (0) WNL WNL      Ankle    Dorsiflexion (20) 10 (WB) 10 (WB)  Plantarflexion (50) 40 35  Inversion (35) 30 20  Eversion (15) 7 8  (* = pain; Blank rows = not tested)   LE MMT: MMT (out of 5) Right  Left   Hip flexion 4 4  Hip extension 4 4  Hip abduction 4 4+  Hip adduction 4+ 4  Hip internal rotation 5 5  Hip external rotation 5 5  Knee flexion 4 4  Knee extension 5 5  Ankle dorsiflexion 5 5  Ankle plantarflexion    Ankle inversion 5 5*  Ankle eversion 5 5*  (* = pain; Blank rows = not tested)  Sensation Diminished sensation left S1 and lateral aspect of left foot.  Reflexes Deferred  Palpation Location LEFT  RIGHT           Plantar fascia 1 0  Dorsal aspect of foot 1 0  Tarsal tunnel 1 0  (  Blank rows = not tested) Graded on 0-4 scale (0 = no pain, 1 = pain, 2 = pain with wincing/grimacing/flinching, 3 = pain with withdrawal, 4 = unwilling to allow palpation), (Blank rows = not tested)  Passive Accessory Motion Superior Tibiofibular Joint: WNL Inferior Tibiofibular Joint: WNL Talocrural Joint Distraction: WNL but noted pain with "pulling feeling" with left foot Talocrural Joint AP: L hypomobile,  painful where hand is purchased Talocrural Joint PA: L hypomobile  SPECIAL TESTS Ligamentous Integrity Anterior Drawer (ATF, 10-15 plantarflexion with anterior translation): Negative Talar Tilt (CFL, inversion): Negative   TODAY'S TREATMENT:   Subjective: Pt noted that she is doing well today. She had mild pain on Sunday when ascending/descending the stairs, but overall pain has been manageable. No further questions or concerns upon arrival.   TherEx NuStep L1-5 x 10 minutes for warm-up and BLE strengtheing during interval history; Seated resisted PF with green tband x 20 BLE; Seated resisted inversion with green tband x 20 BLE; Seated resisted eversion with green tband x 20 BLE; Hooklying bridges x 15; Supine isometric DF 3 x 20s BLE; Supine isometric PF 3 x 20s BLE; Supine adductor squeeze with ball 2 x 15; Supine clam with manual resistance 2 x 15; Seated heel raises x 20;   Manual Therapy Distal hamstring stretch x 45s BLE; Proximal hamstring stretch x 45s BLE; Ankle DF stretch 3 x 30s BLE;   Not today: Heel raises with UE support x 15 (stopped due to pain in hamstring); DF in WB 06/13/23: 10 degrees BLE Ankle pumps 2 x 30s BLE; Seated ankle circumduction 2 x 30s each direction BLE;   PATIENT EDUCATION:  Education details: HEP and plan of care Person educated: Patient Education method: Explanation, Demonstration, and Tactile cues Education comprehension: verbalized understanding and returned demonstration   HOME EXERCISE PROGRAM:  Access Code: [email or web address] URL: [email or web address] Date: 06/13/2023 Prepared by: Ria Comment  Exercises - Seated Ankle Eversion with Resistance  - 1 x daily - 7 x weekly - 2 sets - 10 reps - 3s hold - Seated Ankle Inversion with Resistance and Legs Crossed (Mirrored)  - 1 x daily - 7 x weekly - 2 sets - 10 reps - 3s hold - Seated Ankle Plantarflexion with Resistance  - 1 x daily - 7 x weekly - 3    sets - 10 reps -  3s hold - Long Sitting Calf Stretch with Strap  - 1 x daily - 7 x weekly - 3 reps - 30-45s hold - Standing Gastroc Stretch on Step  - 1 x daily - 7 x weekly - 3 sets - 10 reps - Standing Dorsiflexion Self-Mobilization on Step  - 1 x daily - 7 x weekly - 3 sets - 10 reps - Standing Dorsiflexion Self-Mobilization on Step (Mirrored)  - 1 x daily - 7 x weekly - 3 sets - 10 reps - Standing on Blue foam x 30 secs with EC -heel raises on Blue foam 20 reps   ASSESSMENT:  CLINICAL IMPRESSION: Continued strengthening exercises for the ankle and resisted band exercises for ankle mobility and strength. She still has some pain with exercises in WB positions so primary focus was seated and in supine. No HEP modifications today. Pt will benefit from PT in order to improve functional mobility and improve global strengthening.  OBJECTIVE IMPAIRMENTS: decreased strength and hypomobility.   ACTIVITY LIMITATIONS: stairs and locomotion level  PARTICIPATION LIMITATIONS: occupation and yard work  PERSONAL FACTORS: Age, Behavior pattern, Past/current experiences, Time since  onset of injury/illness/exacerbation, and 1-2 comorbidities: type II diabetes and left lumbar radiculopathy  are also affecting patient's functional outcome.   REHAB POTENTIAL: Good  CLINICAL DECISION MAKING: Evolving/moderate complexity  EVALUATION COMPLEXITY: Moderate   GOALS: Goals reviewed with patient? Yes  SHORT TERM GOALS: Target date: 06/29/23  Pt will be independent with HEP to improve strength and decrease ankle pain to improve pain-free function at home and work. Baseline:  Goal status: INITIAL   LONG TERM GOALS: Target date: 08/03/23  1.  Pt will decrease worst ankle pain by at least 3 points on the NPRS in order to demonstrate clinically significant reduction in ankle pain. Baseline: 06/08/23: 1/10 best, 5/10 worst Goal status: INITIAL  2.  Pt will increase FADI score by at least 4 points in order demonstrate  clinically significant reduction in ankle pain/disability.       Baseline: 06/13/23: 65% Goal status: INITIAL  3.  Pt will improve strength of ankle inversion/eversion with limited to no pain in order to demonstrate improvement in strength and function.  Baseline: 06/08/23: 5/5 with pain Goal status: INITIAL  4.  Pt will report at least 50% improvement in L ankle and knee symptoms in order to demonstrate improvement in her ability to work in the yard/garden with less pain.  Baseline: Goal status: INITIAL   PLAN: PT FREQUENCY: 1-2x/week  PT DURATION: 12 weeks  PLANNED INTERVENTIONS: Therapeutic exercises, Therapeutic activity, Neuromuscular re-education, Balance training, Gait training, Patient/Family education, Self Care, Joint mobilization, Joint manipulation, Vestibular training, Canalith repositioning, Orthotic/Fit training, DME instructions, Dry Needling, Electrical stimulation, Spinal manipulation, Spinal mobilization, Cryotherapy, Moist heat, Taping, Traction, Ultrasound, Ionotophoresis 4mg /ml Dexamethasone, Manual therapy, and Re-evaluation.  PLAN FOR NEXT SESSION: Progress strengthening and mobility exercises, review and modify HEP as necessary, consider TDN, consider STM to plantar fascia and peroneals, initiate ankle isometrics   Janet Berlin PT DPT 9:36 AM,07/17/23

## 2023-07-20 IMAGING — CR DG CHEST 2V
1 series · 2 of 2 positions shown · non-contrast
Comparison: June 23, 2016

CLINICAL DATA: Body aches with cough and nasal congestion.

EXAM:
CHEST - 2 VIEW

[Series 1: dg chest 2 view · 0.14mm/px · 2 of 2 slices shown]
[im 1/2]
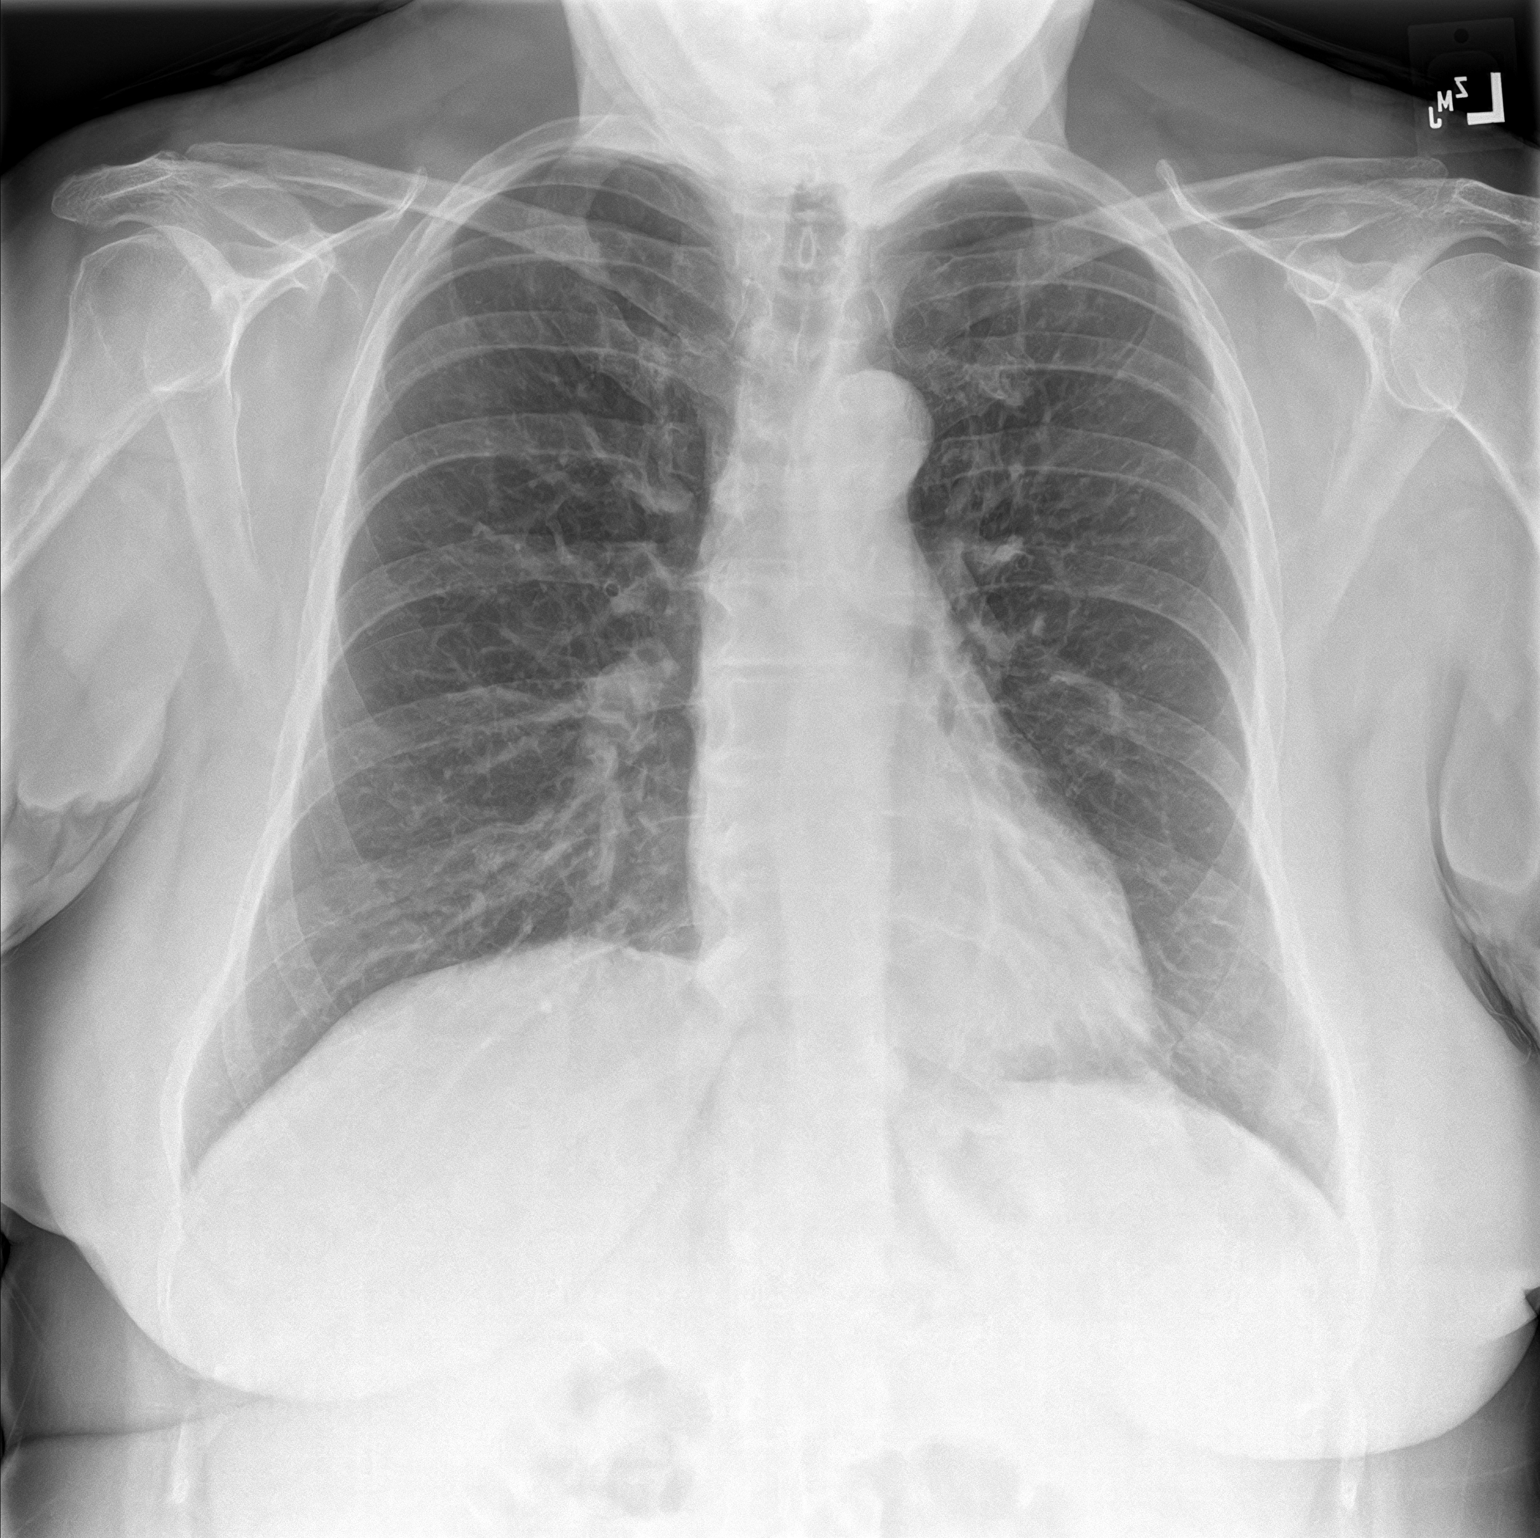
[im 2/2]
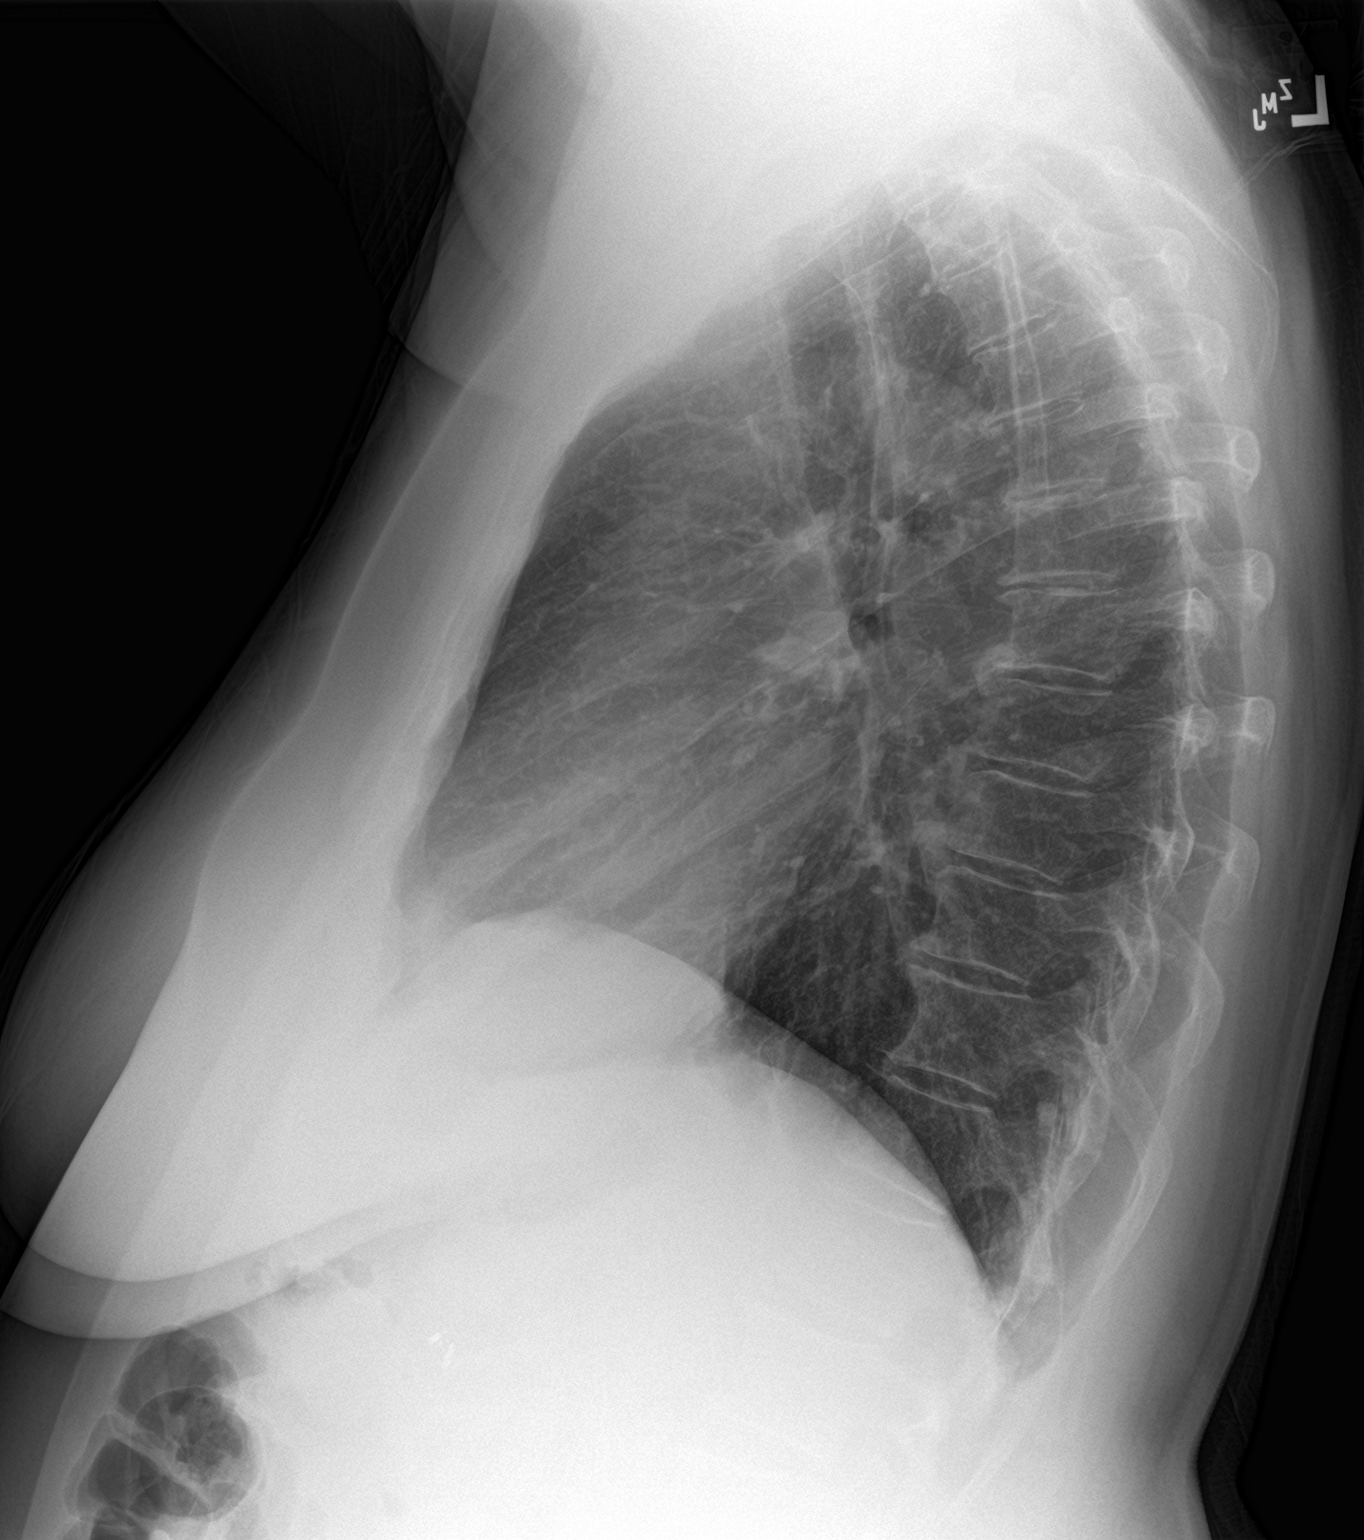

[2 of 2 positions shown; findings below may reference images not displayed]

FINDINGS: The heart size and mediastinal contours are within normal limits.
Both lungs are clear. The visualized skeletal structures are
unremarkable.
IMPRESSION: No active cardiopulmonary disease.

## 2023-07-21 NOTE — Therapy (Signed)
 OUTPATIENT PHYSICAL THERAPY ANKLE TREATMENT  Patient Name: Brandy [name] MRN: [id number] DOB:[birth year], 68 y.o., female Today's Date: 07/21/2023  END OF SESSION:    Past Medical History:  Diagnosis Date   Acute lower GI bleeding 01/19/2020   Arthritis    Atrial fibrillation with rapid ventricular response (HCC)    Chicken pox    Chronic systolic CHF (congestive heart failure) (HCC)    a. 03/2016 Echo: Ef 15-20%, diff HK, ant AK;  b. 05/2016 Echo: Ef 30-35%, diff HK, mildly dil LA/RA.   Diabetes mellitus without complication (HCC)    Dysrhythmia    GERD (gastroesophageal reflux disease)    Heart murmur    Hip discomfort    been going on for 40 years    History of kidney stones    Hyperlipidemia associated with type 2 diabetes mellitus (HCC) 05/12/2021   Hypertension    Moderate mitral regurgitation    a. 03/2016 Echo: mod MR in setting of LV dysfxn.   Motion sickness    car - back seat   NICM (nonischemic cardiomyopathy) (HCC)    a. 03/2016 Echo: EF 15-20%, diff HK, ant AK, mod MR, mod dil LA, mildly dil RA;  b. 04/2016 Cath: nl cors;  c. 05/2016 Echo: EF 30-35%, diff HK.   Obesity    Obstructive sleep apnea    compliant with CPAP   Persistent atrial fibrillation (HCC)    a. Dx 03/2016;  b. CHA2DS2VASc = 2-->Eliquis 5mg  BID;  b. 05/2016 Failed DCCV x 4.   Sleep apnea    Stomach irritation    Visit for monitoring Tikosyn therapy 07/24/2016   Past Surgical History:  Procedure Laterality Date   ATRIAL FIBRILLATION ABLATION N/A 12/09/2020   Procedure: ATRIAL FIBRILLATION ABLATION;  Surgeon: Lanier Prude, MD;  Location: [facility] INVASIVE CV LAB;  Service: Cardiovascular;  Laterality: N/A;   BIOPSY N/A 01/12/2020   Procedure: BIOPSY;  Surgeon: Pasty Spillers, MD;  Location: [facility];  Service: Endoscopy;  Laterality: N/A;   CARDIAC CATHETERIZATION N/A 04/17/2016   Procedure: Left Heart Cath and Coronary Angiography;  Surgeon: Iran Ouch, MD;  Location: [facility]  INVASIVE CV LAB;  Service: Cardiovascular;  Laterality: N/A;   CARDIOVERSION N/A 10/22/2020   Procedure: CARDIOVERSION;  Surgeon: Yvonne Kendall, MD;  Location: [facility] ORS;  Service: Cardiovascular;  Laterality: N/A;   CHOLECYSTECTOMY     COLON SURGERY  2021   COLONOSCOPY N/A 01/20/2020   Procedure: COLONOSCOPY;  Surgeon: Regis Bill, MD;  Location: [facility] ENDOSCOPY;  Service: Endoscopy;  Laterality: N/A;   COLONOSCOPY WITH PROPOFOL N/A 01/12/2020   Procedure: COLONOSCOPY WITH PROPOFOL;  Surgeon: Pasty Spillers, MD;  Location: [facility];  Service: Endoscopy;  Laterality: N/A;  Diabetic - oral meds sleep apnea   ELECTROPHYSIOLOGIC STUDY N/A 05/26/2016   Procedure: Cardioversion;  Surgeon: Iran Ouch, MD;  Location: [facility] ORS;  Service: Cardiovascular;  Laterality: N/A;   ESOPHAGOGASTRODUODENOSCOPY (EGD) WITH PROPOFOL N/A 01/12/2020   Procedure: ESOPHAGOGASTRODUODENOSCOPY (EGD) WITH PROPOFOL;  Surgeon: Pasty Spillers, MD;  Location: [facility];  Service: Endoscopy;  Laterality: N/A;   POLYPECTOMY N/A 01/12/2020   Procedure: POLYPECTOMY;  Surgeon: Pasty Spillers, MD;  Location: [facility];  Service: Endoscopy;  Laterality: N/A;   Patient Active Problem List   Diagnosis Date Noted   Mucous cyst of digit of left hand 05/18/2023   Peroneal tendinitis, left 10/13/2022   Left lumbar radiculopathy 07/26/2022   Arthralgia of left knee 07/26/2022  Pes anserinus bursitis of right knee 04/19/2022   Primary osteoarthritis of right knee 03/27/2022   Greater 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 03/27/2022   It band syndrome, right 03/27/2022   Hyperlipidemia associated with type 2 diabetes mellitus (HCC) 05/12/2021   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   Varicose veins of leg with pain, bilateral 07/06/2020   Type II diabetes mellitus with complication (HCC) 01/19/2020   Gastric polyp    Colon polyps    Varicose veins  of both lower extremities 10/20/2019   Dysphagia 10/20/2019   Osteoarthritis of right hand 10/31/2016   Nonischemic cardiomyopathy (HCC) 04/14/2016   BMI 33.0-33.9,adult 04/14/2016    PCP: Bari Edward, MD  REFERRING PROVIDER: Joseph Berkshire, MD  REFERRING DIAG:  [phone] (ICD-10-CM) - Peroneal tendinitis, left  M54.16 (ICD-10-CM) - Left lumbar radiculopathy  M25.562 (ICD-10-CM) - Arthralgia of left knee  M17.11 (ICD-10-CM) - Primary osteoarthritis of right knee  M76.31 (ICD-10-CM) - It band syndrome, right  M25.551 (ICD-10-CM) - Greater 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   Rationale for Evaluation and Treatment: Rehabilitation  THERAPY DIAG: Difficulty in walking, not elsewhere classified  Muscle weakness (generalized)  ONSET DATE: Chronic, aggravated over Christmas (December 2024)  FOLLOW-UP APPT SCHEDULED WITH REFERRING PROVIDER: No   INITIAL EVALUATION: SUBJECTIVE:                                                                                                                                                                                         SUBJECTIVE STATEMENT:  Left ankle and foot pain  PERTINENT HISTORY: Pt reports pain in left ankle that seems to have "flared up over Christmas" after pt reported excess walking. Pt reports feeling "pulling and burning at the base of the lateral ankle." She has previously been to PT for pain in her left hip and knee but has now noticed increased pain in the left ankle/foot. Pt expresses difficulty with exercises; specifically pressure on the toes, stretching calves, and rocking on toes. Pt notes pain is as high as 5/10 NPS but is constant and "never really goes away," with lowest pain at 1/10 NPS.  PAIN:    Pain Intensity: Present: 1/10, Best: 1/10, Worst: 5/10 Pain location: Lateral foot and ankle Pain Quality: constant and burning  Radiating: Yes  Swelling: No  Popping, catching, locking: No  Numbness/Tingling:  No Focal Weakness: Yes Aggravating factors: Touching lateral ankle, moving the ankle, ascending steps when leading with left foot, walking Relieving factors: Stopping activity, resting, brace temporarily relieves some pain 24-hour pain behavior: No night pain when sleeping on back, stretching in AM helps but feels unstable, PM pain  depends on activity during the day History of prior back, hip, knee, or ankle injury, pain, surgery, or therapy: Yes; chronic hip, knee, foot pain Dominant hand: right Imaging: No  Typical footwear: Sneakers or rubber boots Red flags: Negative for personal history of cancer, chills/fever, night sweats, nausea, vomiting, unrelenting pain): Negative  PRECAUTIONS: None  WEIGHT BEARING RESTRICTIONS: No  FALLS: Has patient fallen in last 6 months? 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  Recent memory is intact.  Remote memory is intact.  Attention span and concentration are intact.  Expressive speech is intact.  Patient's fund of knowledge is within normal limits for educational level.    Gross Musculoskeletal Assessment Bulk: Normal Tone: Normal No trophic changes noted to foot/ankle. No ecchymosis, erythema, or edema noted. No gross ankle/foot deformity noted  GAIT: Comments:  Mild toe out gait on right foot. Antalgic gait noted on left side.  Posture: No noted discrepancies.  AROM AROM (Normal range in degrees) AROM       Hip Right Left  Flexion (125) WNL WNL  Extension (15) WNL WNL  Abduction (40) WNL WNL  Adduction  WNL WNL  Internal Rotation (45) WNL WNL  External Rotation (45) WNL WNL      Knee    Flexion (135) WNL WNL  Extension (0) WNL WNL      Ankle    Dorsiflexion (20) 10 (WB) 10 (WB)  Plantarflexion (50) 40 35  Inversion (35) 30 20  Eversion (15) 7 8  (* = pain; Blank rows = not tested)   LE MMT: MMT (out of 5) Right  Left   Hip flexion 4 4  Hip extension 4 4  Hip abduction 4 4+  Hip adduction 4+ 4  Hip internal rotation 5 5  Hip external rotation 5 5  Knee flexion 4 4  Knee extension 5 5  Ankle dorsiflexion 5 5  Ankle plantarflexion    Ankle inversion 5 5*  Ankle eversion 5 5*  (* = pain; Blank rows = not tested)  Sensation Diminished sensation left S1 and lateral aspect of left foot.  Reflexes Deferred  Palpation Location LEFT  RIGHT           Plantar fascia 1 0  Dorsal aspect of foot 1 0  Tarsal tunnel 1 0  (Blank rows = not tested) Graded on 0-4 scale (0 = no pain, 1 = pain, 2 = pain with wincing/grimacing/flinching, 3 = pain with withdrawal, 4 = unwilling to allow palpation), (Blank rows = not tested)  Passive Accessory Motion Superior Tibiofibular Joint: WNL Inferior Tibiofibular Joint: WNL Talocrural Joint Distraction: WNL but noted pain with "pulling feeling" with left foot Talocrural Joint AP: L hypomobile, painful where hand is purchased Talocrural Joint PA: L hypomobile  SPECIAL TESTS Ligamentous Integrity Anterior Drawer (ATF, 10-15 plantarflexion with anterior translation): Negative Talar Tilt (CFL, inversion): Negative  TODAY'S TREATMENT:   Subjective: Pt noted that she is doing well today. She had mild pain on Sunday when ascending/descending the stairs, but overall pain has been  manageable. No further questions or concerns upon arrival.   TherEx NuStep L1-5 x 10 minutes for warm-up and BLE strengtheing during interval history; Seated resisted PF with green tband x 20 BLE; Seated resisted inversion with green tband x 20 BLE; Seated resisted eversion with green tband x 20 BLE; Hooklying bridges x 15; Supine isometric DF 3 x 20s BLE; Supine isometric PF 3 x 20s BLE; Supine adductor squeeze with ball 2 x 15; Supine clam with manual resistance 2 x 15; Seated heel raises x 20;   Manual Therapy Distal hamstring stretch x 45s BLE; Proximal hamstring stretch x 45s BLE; Ankle DF stretch 3 x 30s BLE;   Not today: Heel raises with UE support x 15 (stopped due to pain in hamstring); DF in WB 06/13/23: 10 degrees BLE Ankle pumps 2 x 30s BLE; Seated ankle circumduction 2 x 30s each direction BLE;   PATIENT EDUCATION:  Education details: HEP and plan of care Person educated: Patient Education method: Explanation, Demonstration, and Tactile cues Education comprehension: verbalized understanding and returned demonstration   HOME EXERCISE PROGRAM:  Access Code: [email or web address] URL: [email or web address] Date: 06/13/2023 Prepared by: Ria Comment  Exercises - Seated Ankle Eversion with Resistance  - 1 x daily - 7 x weekly - 2 sets - 10 reps - 3s hold - Seated Ankle Inversion with Resistance and Legs Crossed (Mirrored)  - 1 x daily - 7 x weekly - 2 sets - 10 reps - 3s hold - Seated Ankle Plantarflexion with Resistance  - 1 x daily - 7 x weekly - 3    sets - 10 reps - 3s hold - Long Sitting Calf Stretch with Strap  - 1 x daily - 7 x weekly - 3 reps - 30-45s hold - Standing Gastroc Stretch on Step  - 1 x daily - 7 x weekly - 3 sets - 10 reps - Standing Dorsiflexion Self-Mobilization on Step  - 1 x daily - 7 x weekly - 3 sets - 10 reps - Standing Dorsiflexion Self-Mobilization on Step (Mirrored)  - 1 x daily - 7 x weekly - 3 sets - 10 reps - Standing on  Blue foam x 30 secs with EC -heel raises on Blue foam 20 reps   ASSESSMENT:  CLINICAL IMPRESSION: Continued strengthening exercises for the ankle and resisted band exercises for ankle mobility and strength. She still has some pain with exercises in WB positions so primary focus was seated and in supine. No HEP modifications today. Pt will benefit from PT in order to improve functional mobility and improve global strengthening.  OBJECTIVE IMPAIRMENTS: decreased strength and hypomobility.   ACTIVITY LIMITATIONS: stairs and locomotion level  PARTICIPATION LIMITATIONS: occupation and yard work  PERSONAL FACTORS: Age, Behavior pattern, Past/current experiences, Time since onset of injury/illness/exacerbation, and 1-2 comorbidities: type II diabetes and left lumbar radiculopathy  are also affecting patient's functional outcome.   REHAB POTENTIAL: Good  CLINICAL DECISION MAKING: Evolving/moderate complexity  EVALUATION COMPLEXITY: Moderate   GOALS: Goals reviewed with patient? Yes  SHORT TERM GOALS: Target date: 06/29/23  Pt will be independent with HEP to improve strength and decrease ankle pain to improve pain-free function at home and work. Baseline:  Goal status: INITIAL   LONG TERM GOALS: Target date: 08/03/23  1.  Pt will decrease worst ankle pain by at least 3  points on the NPRS in order to demonstrate clinically significant reduction in ankle pain. Baseline: 06/08/23: 1/10 best, 5/10 worst Goal status: INITIAL  2.  Pt will increase FADI score by at least 4 points in order demonstrate clinically significant reduction in ankle pain/disability.       Baseline: 06/13/23: 65% Goal status: INITIAL  3.  Pt will improve strength of ankle inversion/eversion with limited to no pain in order to demonstrate improvement in strength and function.  Baseline: 06/08/23: 5/5 with pain Goal status: INITIAL  4.  Pt will report at least 50% improvement in L ankle and knee symptoms in order to  demonstrate improvement in her ability to work in the yard/garden with less pain.  Baseline: Goal status: INITIAL   PLAN: PT FREQUENCY: 1-2x/week  PT DURATION: 12 weeks  PLANNED INTERVENTIONS: Therapeutic exercises, Therapeutic activity, Neuromuscular re-education, Balance training, Gait training, Patient/Family education, Self Care, Joint mobilization, Joint manipulation, Vestibular training, Canalith repositioning, Orthotic/Fit training, DME instructions, Dry Needling, Electrical stimulation, Spinal manipulation, Spinal mobilization, Cryotherapy, Moist heat, Taping, Traction, Ultrasound, Ionotophoresis 4mg /ml Dexamethasone, Manual therapy, and Re-evaluation.  PLAN FOR NEXT SESSION: Progress strengthening and mobility exercises, review and modify HEP as necessary, consider TDN, consider STM to plantar fascia and peroneals, initiate ankle isometrics   Janet Berlin PT DPT 1:34 PM,07/21/23

## 2023-07-24 ENCOUNTER — Ambulatory Visit: Payer: Medicare Other

## 2023-07-24 DIAGNOSIS — M25572 Pain in left ankle and joints of left foot: Secondary | ICD-10-CM | POA: Diagnosis not present

## 2023-07-24 DIAGNOSIS — M25562 Pain in left knee: Secondary | ICD-10-CM | POA: Diagnosis not present

## 2023-07-24 DIAGNOSIS — R2681 Unsteadiness on feet: Secondary | ICD-10-CM | POA: Diagnosis not present

## 2023-07-24 DIAGNOSIS — K080 Exfoliation of teeth due to systemic causes: Secondary | ICD-10-CM | POA: Diagnosis not present

## 2023-07-24 DIAGNOSIS — G8929 Other chronic pain: Secondary | ICD-10-CM | POA: Diagnosis not present

## 2023-07-24 DIAGNOSIS — M5459 Other low back pain: Secondary | ICD-10-CM | POA: Diagnosis not present

## 2023-07-24 DIAGNOSIS — R262 Difficulty in walking, not elsewhere classified: Secondary | ICD-10-CM | POA: Diagnosis not present

## 2023-07-24 DIAGNOSIS — M6281 Muscle weakness (generalized): Secondary | ICD-10-CM

## 2023-07-31 ENCOUNTER — Ambulatory Visit: Payer: Medicare Other

## 2023-07-31 ENCOUNTER — Encounter: Payer: Self-pay | Admitting: Internal Medicine

## 2023-07-31 DIAGNOSIS — M5459 Other low back pain: Secondary | ICD-10-CM | POA: Diagnosis not present

## 2023-07-31 DIAGNOSIS — R2681 Unsteadiness on feet: Secondary | ICD-10-CM

## 2023-07-31 DIAGNOSIS — M6281 Muscle weakness (generalized): Secondary | ICD-10-CM | POA: Diagnosis not present

## 2023-07-31 DIAGNOSIS — R262 Difficulty in walking, not elsewhere classified: Secondary | ICD-10-CM | POA: Diagnosis not present

## 2023-07-31 DIAGNOSIS — G8929 Other chronic pain: Secondary | ICD-10-CM | POA: Diagnosis not present

## 2023-07-31 DIAGNOSIS — M25562 Pain in left knee: Secondary | ICD-10-CM | POA: Diagnosis not present

## 2023-07-31 DIAGNOSIS — M25572 Pain in left ankle and joints of left foot: Secondary | ICD-10-CM | POA: Diagnosis not present

## 2023-07-31 NOTE — Therapy (Signed)
 OUTPATIENT PHYSICAL THERAPY ANKLE TREATMENT  Patient Name: Brandy [name] MRN: [id number] DOB:1955/06/28, 68 y.o., female Today's Date: 07/31/2023  END OF SESSION:  PT End of Session - 07/31/23 1334     Visit Number 8    Number of Visits 25    Date for PT Re-Evaluation 08/31/23    Authorization Type 06/08/23    PT Start Time 0842    PT Stop Time 0930    PT Time Calculation (min) 48 min    Activity Tolerance Patient tolerated treatment well    Behavior During Therapy [facility] for tasks assessed/performed             Past Medical History:  Diagnosis Date   Acute lower GI bleeding 01/19/2020   Arthritis    Atrial fibrillation with rapid ventricular response (HCC)    Chicken pox    Chronic systolic CHF (congestive heart failure) (HCC)    a. 03/2016 Echo: Ef 15-20%, diff HK, ant AK;  b. 05/2016 Echo: Ef 30-35%, diff HK, mildly dil LA/RA.   Diabetes mellitus without complication (HCC)    Dysrhythmia    GERD (gastroesophageal reflux disease)    Heart murmur    Hip discomfort    been going on for 40 years    History of kidney stones    Hyperlipidemia associated with type 2 diabetes mellitus (HCC) 05/12/2021   Hypertension    Moderate mitral regurgitation    a. 03/2016 Echo: mod MR in setting of LV dysfxn.   Motion sickness    car - back seat   NICM (nonischemic cardiomyopathy) (HCC)    a. 03/2016 Echo: EF 15-20%, diff HK, ant AK, mod MR, mod dil LA, mildly dil RA;  b. 04/2016 Cath: nl cors;  c. 05/2016 Echo: EF 30-35%, diff HK.   Obesity    Obstructive sleep apnea    compliant with CPAP   Persistent atrial fibrillation (HCC)    a. Dx 03/2016;  b. CHA2DS2VASc = 2-->Eliquis 5mg  BID;  b. 05/2016 Failed DCCV x 4.   Sleep apnea    Stomach irritation    Visit for monitoring Tikosyn therapy 07/24/2016   Past Surgical History:  Procedure Laterality Date   ATRIAL FIBRILLATION ABLATION N/A 12/09/2020   Procedure: ATRIAL FIBRILLATION ABLATION;  Surgeon: Lanier Prude, MD;  Location: [facility]  INVASIVE CV LAB;  Service: Cardiovascular;  Laterality: N/A;   BIOPSY N/A 01/12/2020   Procedure: BIOPSY;  Surgeon: Pasty Spillers, MD;  Location: [facility];  Service: Endoscopy;  Laterality: N/A;   CARDIAC CATHETERIZATION N/A 04/17/2016   Procedure: Left Heart Cath and Coronary Angiography;  Surgeon: Iran Ouch, MD;  Location: [facility] INVASIVE CV LAB;  Service: Cardiovascular;  Laterality: N/A;   CARDIOVERSION N/A 10/22/2020   Procedure: CARDIOVERSION;  Surgeon: Yvonne Kendall, MD;  Location: [facility] ORS;  Service: Cardiovascular;  Laterality: N/A;   CHOLECYSTECTOMY     COLON SURGERY  2021   COLONOSCOPY N/A 01/20/2020   Procedure: COLONOSCOPY;  Surgeon: Regis Bill, MD;  Location: [facility] ENDOSCOPY;  Service: Endoscopy;  Laterality: N/A;   COLONOSCOPY WITH PROPOFOL N/A 01/12/2020   Procedure: COLONOSCOPY WITH PROPOFOL;  Surgeon: Pasty Spillers, MD;  Location: [facility];  Service: Endoscopy;  Laterality: N/A;  Diabetic - oral meds sleep apnea   ELECTROPHYSIOLOGIC STUDY N/A 05/26/2016   Procedure: Cardioversion;  Surgeon: Iran Ouch, MD;  Location: [facility] ORS;  Service: Cardiovascular;  Laterality: N/A;   ESOPHAGOGASTRODUODENOSCOPY (EGD) WITH PROPOFOL N/A 01/12/2020   Procedure:  ESOPHAGOGASTRODUODENOSCOPY (EGD) WITH PROPOFOL;  Surgeon: Pasty Spillers, MD;  Location: [facility];  Service: Endoscopy;  Laterality: N/A;   POLYPECTOMY N/A 01/12/2020   Procedure: POLYPECTOMY;  Surgeon: Pasty Spillers, MD;  Location: [facility];  Service: Endoscopy;  Laterality: N/A;   Patient Active Problem List   Diagnosis Date Noted   Mucous cyst of digit of left hand 05/18/2023   Peroneal tendinitis, left 10/13/2022   Left lumbar radiculopathy 07/26/2022   Arthralgia of left knee 07/26/2022   Pes anserinus bursitis of right knee 04/19/2022   Primary osteoarthritis of right knee 03/27/2022   Greater trochanteric pain syndrome of right lower  extremity 03/27/2022   It band syndrome, right 03/27/2022   Hyperlipidemia associated with type 2 diabetes mellitus (HCC) 05/12/2021   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   Varicose veins of leg with pain, bilateral 07/06/2020   Type II diabetes mellitus with complication (HCC) 01/19/2020   Gastric polyp    Colon polyps    Varicose veins of both lower extremities 10/20/2019   Dysphagia 10/20/2019   Osteoarthritis of right hand 10/31/2016   Nonischemic cardiomyopathy (HCC) 04/14/2016   BMI 33.0-33.9,adult 04/14/2016    PCP: Bari Edward, MD  REFERRING PROVIDER: Joseph Berkshire, MD  REFERRING DIAG:  [phone] (ICD-10-CM) - Peroneal tendinitis, left  M54.16 (ICD-10-CM) - Left lumbar radiculopathy  M25.562 (ICD-10-CM) - Arthralgia of left knee  M17.11 (ICD-10-CM) - Primary osteoarthritis of right knee  M76.31 (ICD-10-CM) - It band syndrome, right  M25.551 (ICD-10-CM) - Greater 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   Rationale for Evaluation and Treatment: Rehabilitation  THERAPY DIAG: Difficulty in walking, not elsewhere classified  Muscle weakness (generalized)  Unsteadiness on feet  Other low back pain  ONSET DATE: Chronic, aggravated over Christmas (December 2024)  FOLLOW-UP APPT SCHEDULED WITH REFERRING PROVIDER: No   INITIAL EVALUATION: SUBJECTIVE:                                                                                                                                                                                         SUBJECTIVE STATEMENT:  Left ankle and foot pain  PERTINENT HISTORY: Pt reports pain in left ankle that seems to have "flared up over Christmas" after pt reported excess walking. Pt reports feeling "pulling and burning at the base of the lateral ankle." She has previously been to PT for pain in her left hip and knee but has now noticed increased pain in the left ankle/foot. Pt expresses difficulty with exercises;  specifically pressure on the toes, stretching calves, and rocking on toes. Pt notes pain is as high as 5/10 NPS but is  constant and "never really goes away," with lowest pain at 1/10 NPS.  PAIN:    Pain Intensity: Present: 1/10, Best: 1/10, Worst: 5/10 Pain location: Lateral foot and ankle Pain Quality: constant and burning  Radiating: Yes  Swelling: No  Popping, catching, locking: No  Numbness/Tingling: No Focal Weakness: Yes Aggravating factors: Touching lateral ankle, moving the ankle, ascending steps when leading with left foot, walking Relieving factors: Stopping activity, resting, brace temporarily relieves some pain 24-hour pain behavior: No night pain when sleeping on back, stretching in AM helps but feels unstable, PM pain depends on activity during the day History of prior back, hip, knee, or ankle injury, pain, surgery, or therapy: Yes; chronic hip, knee, foot pain Dominant hand: right Imaging: No  Typical footwear: Sneakers or rubber boots Red flags: Negative for personal history of cancer, chills/fever, night sweats, nausea, vomiting, unrelenting pain): Negative  PRECAUTIONS: None  WEIGHT BEARING RESTRICTIONS: No  FALLS: Has patient fallen in last 6 months? 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   Recent memory is intact.  Remote memory is intact.  Attention span and concentration are intact.  Expressive speech is intact.  Patient's fund of knowledge is within normal limits for educational level.    Gross Musculoskeletal Assessment Bulk: Normal Tone: Normal No trophic changes noted to foot/ankle. No ecchymosis, erythema, or edema noted. No gross ankle/foot deformity noted  GAIT: Comments: Mild toe out gait on right foot. Antalgic gait noted on left side.  Posture: No noted discrepancies.  AROM AROM (Normal range in degrees) AROM       Hip Right Left  Flexion (125) WNL WNL  Extension (15) WNL WNL  Abduction (40) WNL WNL  Adduction  WNL WNL  Internal Rotation (45) WNL WNL  External Rotation (45) WNL WNL      Knee    Flexion (135) WNL WNL  Extension (0) WNL WNL      Ankle    Dorsiflexion (20) 10 (WB) 10 (WB)  Plantarflexion (50) 40 35  Inversion (35) 30 20  Eversion (15) 7 8  (* = pain; Blank rows = not tested)   LE MMT: MMT (out of 5) Right  Left   Hip flexion 4 4  Hip extension 4 4  Hip abduction 4 4+  Hip adduction 4+ 4  Hip internal rotation 5 5  Hip external rotation 5 5  Knee flexion 4 4  Knee extension 5 5  Ankle dorsiflexion 5 5  Ankle plantarflexion    Ankle inversion 5 5*  Ankle eversion 5 5*  (* = pain; Blank rows = not tested)  Sensation Diminished sensation left S1 and lateral aspect of left foot.  Reflexes Deferred  Palpation Location LEFT  RIGHT           Plantar fascia 1 0  Dorsal aspect of foot 1 0  Tarsal tunnel 1  0  (Blank rows = not tested) Graded on 0-4 scale (0 = no pain, 1 = pain, 2 = pain with wincing/grimacing/flinching, 3 = pain with withdrawal, 4 = unwilling to allow palpation), (Blank rows = not tested)  Passive Accessory Motion Superior Tibiofibular Joint: WNL Inferior Tibiofibular Joint: WNL Talocrural Joint Distraction: WNL but noted pain with "pulling feeling" with left foot Talocrural Joint AP: L  hypomobile, painful where hand is purchased Talocrural Joint PA: L hypomobile  SPECIAL TESTS Ligamentous Integrity Anterior Drawer (ATF, 10-15 plantarflexion with anterior translation): Negative Talar Tilt (CFL, inversion): Negative   TODAY'S TREATMENT: 07/31/2023   Subjective: Pt notes ongoing bilateral hip, knee, and L ankle pain. Pain has been slightly worse recently as she has been gardening more regularly.    TherEx:32 NuStep L1-5 x 10 minutes for warm-up and BLE strengtheing during interval history; FADIR and FABER hip stretch to R x 2 x 60secs, Hooklying glute bridges 2 x 10, cues for glute engagement due to reports of low back pain; Hooklying  ball squeeze 2 x 60 secs x 10; Sidelying clam with manual resistance 2 x 10 BLE; Sidelying hip abduction with manual resistance 2 x 10 BLE; Sidelying reverse clam with manual resistance 2 x 10 BLE; SLR 2 x 10 reps STS with Ball squeeze and BTB   THERE ACT Pt educaiotn about core activation and hip jt muscle stretching. HEP updated and provided in H/O.   Manual Therapy 4 mins Not charged R hip distraction  Passive IT band and QL stretch  Not today:  STM to L ITB with Theraband roller; Single knee to chest stretch x 30s BLE; Heel raises with UE support x 15 (stopped due to pain in hamstring); DF in WB 06/13/23: 10 degrees BLE Ankle pumps 2 x 30s BLE; Seated ankle circumduction 2 x 30s each direction BLE; Seated resisted PF with green tband x 20 BLE; Seated resisted inversion with green tband x 20 BLE; Seated resisted eversion with green tband x 20 BLE; Supine isometric DF 3 x 20s BLE; Supine isometric PF 3 x 20s BLE; Distal hamstring stretch x 45s BLE; Ankle DF stretch 3 x 30s BLE;   PATIENT EDUCATION:  Education details: HEP and plan of care Person educated: Patient Education method: Explanation, Demonstration, and Tactile cues Education comprehension: verbalized understanding and returned demonstration   HOME  EXERCISE PROGRAM:  Access Code: [email or web address] URL: [email or web address] Date: 06/13/2023 Prepared by: Ria Comment  Exercises - Seated Ankle Eversion with Resistance  - 1 x daily - 7 x weekly - 2 sets - 10 reps - 3s hold - Seated Ankle Inversion with Resistance and Legs Crossed (Mirrored)  - 1 x daily - 7 x weekly - 2 sets - 10 reps - 3s hold - Seated Ankle Plantarflexion with Resistance  - 1 x daily - 7 x weekly - 3    sets - 10 reps - 3s hold - Long Sitting Calf Stretch with Strap  - 1 x daily - 7 x weekly - 3 reps - 30-45s hold - Standing Gastroc Stretch on Step  - 1 x daily - 7 x weekly - 3 sets - 10 reps - Standing Dorsiflexion Self-Mobilization on Step  - 1 x daily - 7 x weekly - 3 sets - 10 reps - Standing Dorsiflexion Self-Mobilization on Step (Mirrored)  - 1 x daily - 7 x weekly - 3 sets - 10 reps - Standing on Blue foam x 30 secs with EC -heel raises on Blue  foam 20 reps   ASSESSMENT:  CLINICAL IMPRESSION: PT focused on R hip muscles related tightness and weakness. Educated pt about girth weight, fascia tightness and stretches to reduce pai and improve mobility. Continued strengthening exercises for hips and knees as well as modalities to manage pain and improve tissue extensibility. Primary focus on mat table exercises to minimize pain.HEP updated and provided in H/O.  Pt will benefit from PT in order to improve functional mobility and improve global strengthening.  OBJECTIVE IMPAIRMENTS: decreased strength and hypomobility.   ACTIVITY LIMITATIONS: stairs and locomotion level  PARTICIPATION LIMITATIONS: occupation and yard work  PERSONAL FACTORS: Age, Behavior pattern, Past/current experiences, Time since onset of injury/illness/exacerbation, and 1-2 comorbidities: type II diabetes and left lumbar radiculopathy  are also affecting patient's functional outcome.   REHAB POTENTIAL: Good  CLINICAL DECISION MAKING: Evolving/moderate complexity  EVALUATION  COMPLEXITY: Moderate   GOALS: Goals reviewed with patient? Yes  SHORT TERM GOALS: Target date: 06/29/23  Pt will be independent with HEP to improve strength and decrease ankle pain to improve pain-free function at home and work. Baseline:  Goal status: INITIAL   LONG TERM GOALS: Target date: 08/03/23  1.  Pt will decrease worst ankle pain by at least 3 points on the NPRS in order to demonstrate clinically significant reduction in ankle pain. Baseline: 06/08/23: 1/10 best, 5/10 worst Goal status: INITIAL  2.  Pt will increase FADI score by at least 4 points in order demonstrate clinically significant reduction in ankle pain/disability.       Baseline: 06/13/23: 65% Goal status: INITIAL  3.  Pt will improve strength of ankle inversion/eversion with limited to no pain in order to demonstrate improvement in strength and function.  Baseline: 06/08/23: 5/5 with pain Goal status: INITIAL  4.  Pt will report at least 50% improvement in L ankle and knee symptoms in order to demonstrate improvement in her ability to work in the yard/garden with less pain.  Baseline: Goal status: INITIAL   PLAN: PT FREQUENCY: 1-2x/week  PT DURATION: 12 weeks  PLANNED INTERVENTIONS: Therapeutic exercises, Therapeutic activity, Neuromuscular re-education, Balance training, Gait training, Patient/Family education, Self Care, Joint mobilization, Joint manipulation, Vestibular training, Canalith repositioning, Orthotic/Fit training, DME instructions, Dry Needling, Electrical stimulation, Spinal manipulation, Spinal mobilization, Cryotherapy, Moist heat, Taping, Traction, Ultrasound, Ionotophoresis 4mg /ml Dexamethasone, Manual therapy, and Re-evaluation.  PLAN FOR NEXT SESSION: Progress strengthening and mobility exercises, review and modify HEP as necessary, consider TDN, consider STM to plantar fascia and peroneals,   Janet Berlin PT DPT 1:39 PM,07/31/23

## 2023-08-01 DIAGNOSIS — Z86018 Personal history of other benign neoplasm: Secondary | ICD-10-CM | POA: Diagnosis not present

## 2023-08-01 DIAGNOSIS — M71342 Other bursal cyst, left hand: Secondary | ICD-10-CM | POA: Diagnosis not present

## 2023-08-01 DIAGNOSIS — L570 Actinic keratosis: Secondary | ICD-10-CM | POA: Diagnosis not present

## 2023-08-01 DIAGNOSIS — Z872 Personal history of diseases of the skin and subcutaneous tissue: Secondary | ICD-10-CM | POA: Diagnosis not present

## 2023-08-01 DIAGNOSIS — L578 Other skin changes due to chronic exposure to nonionizing radiation: Secondary | ICD-10-CM | POA: Diagnosis not present

## 2023-08-06 NOTE — Therapy (Signed)
 OUTPATIENT PHYSICAL THERAPY ANKLE TREATMENT  Patient Name: Brandy [name] MRN: [id number] DOB:[birth year], 68 y.o., female Today's Date: 08/07/2023  END OF SESSION:  PT End of Session - 08/07/23 0803     Visit Number 9    Number of Visits 25    Date for PT Re-Evaluation 08/31/23    Authorization Type 06/08/23    PT Start Time 0800    PT Stop Time 0845    PT Time Calculation (min) 45 min    Activity Tolerance Patient tolerated treatment well    Behavior During Therapy [facility] for tasks assessed/performed            Past Medical History:  Diagnosis Date   Acute lower GI bleeding 01/19/2020   Arthritis    Atrial fibrillation with rapid ventricular response (HCC)    Chicken pox    Chronic systolic CHF (congestive heart failure) (HCC)    a. 03/2016 Echo: Ef 15-20%, diff HK, ant AK;  b. 05/2016 Echo: Ef 30-35%, diff HK, mildly dil LA/RA.   Diabetes mellitus without complication (HCC)    Dysrhythmia    GERD (gastroesophageal reflux disease)    Heart murmur    Hip discomfort    been going on for 40 years    History of kidney stones    Hyperlipidemia associated with type 2 diabetes mellitus (HCC) 05/12/2021   Hypertension    Moderate mitral regurgitation    a. 03/2016 Echo: mod MR in setting of LV dysfxn.   Motion sickness    car - back seat   NICM (nonischemic cardiomyopathy) (HCC)    a. 03/2016 Echo: EF 15-20%, diff HK, ant AK, mod MR, mod dil LA, mildly dil RA;  b. 04/2016 Cath: nl cors;  c. 05/2016 Echo: EF 30-35%, diff HK.   Obesity    Obstructive sleep apnea    compliant with CPAP   Persistent atrial fibrillation (HCC)    a. Dx 03/2016;  b. CHA2DS2VASc = 2-->Eliquis 5mg  BID;  b. 05/2016 Failed DCCV x 4.   Sleep apnea    Stomach irritation    Visit for monitoring Tikosyn therapy 07/24/2016   Past Surgical History:  Procedure Laterality Date   ATRIAL FIBRILLATION ABLATION N/A 12/09/2020   Procedure: ATRIAL FIBRILLATION ABLATION;  Surgeon: Lanier Prude, MD;  Location: [facility]  INVASIVE CV LAB;  Service: Cardiovascular;  Laterality: N/A;   BIOPSY N/A 01/12/2020   Procedure: BIOPSY;  Surgeon: Pasty Spillers, MD;  Location: [facility];  Service: Endoscopy;  Laterality: N/A;   CARDIAC CATHETERIZATION N/A 04/17/2016   Procedure: Left Heart Cath and Coronary Angiography;  Surgeon: Iran Ouch, MD;  Location: [facility] INVASIVE CV LAB;  Service: Cardiovascular;  Laterality: N/A;   CARDIOVERSION N/A 10/22/2020   Procedure: CARDIOVERSION;  Surgeon: Yvonne Kendall, MD;  Location: [facility] ORS;  Service: Cardiovascular;  Laterality: N/A;   CHOLECYSTECTOMY     COLON SURGERY  2021   COLONOSCOPY N/A 01/20/2020   Procedure: COLONOSCOPY;  Surgeon: Regis Bill, MD;  Location: [facility] ENDOSCOPY;  Service: Endoscopy;  Laterality: N/A;   COLONOSCOPY WITH PROPOFOL N/A 01/12/2020   Procedure: COLONOSCOPY WITH PROPOFOL;  Surgeon: Pasty Spillers, MD;  Location: [facility];  Service: Endoscopy;  Laterality: N/A;  Diabetic - oral meds sleep apnea   ELECTROPHYSIOLOGIC STUDY N/A 05/26/2016   Procedure: Cardioversion;  Surgeon: Iran Ouch, MD;  Location: [facility] ORS;  Service: Cardiovascular;  Laterality: N/A;   ESOPHAGOGASTRODUODENOSCOPY (EGD) WITH PROPOFOL N/A 01/12/2020   Procedure: ESOPHAGOGASTRODUODENOSCOPY (  EGD) WITH PROPOFOL;  Surgeon: Pasty Spillers, MD;  Location: [facility];  Service: Endoscopy;  Laterality: N/A;   POLYPECTOMY N/A 01/12/2020   Procedure: POLYPECTOMY;  Surgeon: Pasty Spillers, MD;  Location: [facility];  Service: Endoscopy;  Laterality: N/A;   Patient Active Problem List   Diagnosis Date Noted   Mucous cyst of digit of left hand 05/18/2023   Peroneal tendinitis, left 10/13/2022   Left lumbar radiculopathy 07/26/2022   Arthralgia of left knee 07/26/2022   Pes anserinus bursitis of right knee 04/19/2022   Primary osteoarthritis of right knee 03/27/2022   Greater trochanteric pain syndrome of right lower  extremity 03/27/2022   It band syndrome, right 03/27/2022   Hyperlipidemia associated with type 2 diabetes mellitus (HCC) 05/12/2021   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   Varicose veins of leg with pain, bilateral 07/06/2020   Type II diabetes mellitus with complication (HCC) 01/19/2020   Gastric polyp    Colon polyps    Varicose veins of both lower extremities 10/20/2019   Dysphagia 10/20/2019   Osteoarthritis of right hand 10/31/2016   Nonischemic cardiomyopathy (HCC) 04/14/2016   BMI 33.0-33.9,adult 04/14/2016    PCP: Bari Edward, MD  REFERRING PROVIDER: Joseph Berkshire, MD  REFERRING DIAG:  [phone] (ICD-10-CM) - Peroneal tendinitis, left  M54.16 (ICD-10-CM) - Left lumbar radiculopathy  M25.562 (ICD-10-CM) - Arthralgia of left knee  M17.11 (ICD-10-CM) - Primary osteoarthritis of right knee  M76.31 (ICD-10-CM) - It band syndrome, right  M25.551 (ICD-10-CM) - Greater 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   Rationale for Evaluation and Treatment: Rehabilitation  THERAPY DIAG: Muscle weakness (generalized)  Chronic pain of left knee  ONSET DATE: Chronic, aggravated over Christmas (December 2024)  FOLLOW-UP APPT SCHEDULED WITH REFERRING PROVIDER: No   INITIAL EVALUATION: SUBJECTIVE:                                                                                                                                                                                         SUBJECTIVE STATEMENT:  Left ankle and foot pain  PERTINENT HISTORY: Pt reports pain in left ankle that seems to have "flared up over Christmas" after pt reported excess walking. Pt reports feeling "pulling and burning at the base of the lateral ankle." She has previously been to PT for pain in her left hip and knee but has now noticed increased pain in the left ankle/foot. Pt expresses difficulty with exercises; specifically pressure on the toes, stretching calves, and rocking on toes.  Pt notes pain is as high as 5/10 NPS but is constant and "never really goes away," with lowest pain at 1/10  NPS.  PAIN:    Pain Intensity: Present: 1/10, Best: 1/10, Worst: 5/10 Pain location: Lateral foot and ankle Pain Quality: constant and burning  Radiating: Yes  Swelling: No  Popping, catching, locking: No  Numbness/Tingling: No Focal Weakness: Yes Aggravating factors: Touching lateral ankle, moving the ankle, ascending steps when leading with left foot, walking Relieving factors: Stopping activity, resting, brace temporarily relieves some pain 24-hour pain behavior: No night pain when sleeping on back, stretching in AM helps but feels unstable, PM pain depends on activity during the day History of prior back, hip, knee, or ankle injury, pain, surgery, or therapy: Yes; chronic hip, knee, foot pain Dominant hand: right Imaging: No  Typical footwear: Sneakers or rubber boots Red flags: Negative for personal history of cancer, chills/fever, night sweats, nausea, vomiting, unrelenting pain): Negative  PRECAUTIONS: None  WEIGHT BEARING RESTRICTIONS: No  FALLS: Has patient fallen in last 6 months? 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  Recent memory is intact.  Remote memory is intact.  Attention span and  concentration are intact.  Expressive speech is intact.  Patient's fund of knowledge is within normal limits for educational level.    Gross Musculoskeletal Assessment Bulk: Normal Tone: Normal No trophic changes noted to foot/ankle. No ecchymosis, erythema, or edema noted. No gross ankle/foot deformity noted  GAIT: Comments: Mild toe out gait on right foot. Antalgic gait noted on left side.  Posture: No noted discrepancies.  AROM AROM (Normal range in degrees) AROM       Hip Right Left  Flexion (125) WNL WNL  Extension (15) WNL WNL  Abduction (40) WNL WNL  Adduction  WNL WNL  Internal Rotation (45) WNL WNL  External Rotation (45) WNL WNL      Knee    Flexion (135) WNL WNL  Extension (0) WNL WNL      Ankle    Dorsiflexion (20) 10 (WB) 10 (WB)  Plantarflexion (50) 40 35  Inversion (35) 30 20  Eversion (15) 7 8  (* = pain; Blank rows = not tested)   LE MMT: MMT (out of 5) Right  Left   Hip flexion 4 4  Hip extension 4 4  Hip abduction 4 4+  Hip adduction 4+ 4  Hip internal rotation 5 5  Hip external rotation 5 5  Knee flexion 4 4  Knee extension 5 5  Ankle dorsiflexion 5 5  Ankle plantarflexion    Ankle inversion 5 5*  Ankle eversion 5 5*  (* = pain; Blank rows = not tested)  Sensation Diminished sensation left S1 and lateral aspect of left foot.  Reflexes Deferred  Palpation Location LEFT  RIGHT           Plantar fascia 1 0  Dorsal aspect of foot 1 0  Tarsal tunnel 1 0  (Blank rows = not tested) Graded on 0-4 scale (  0 = no pain, 1 = pain, 2 = pain with wincing/grimacing/flinching, 3 = pain with withdrawal, 4 = unwilling to allow palpation), (Blank rows = not tested)  Passive Accessory Motion Superior Tibiofibular Joint: WNL Inferior Tibiofibular Joint: WNL Talocrural Joint Distraction: WNL but noted pain with "pulling feeling" with left foot Talocrural Joint AP: L hypomobile, painful where hand is purchased Talocrural Joint PA: L  hypomobile  SPECIAL TESTS Ligamentous Integrity Anterior Drawer (ATF, 10-15 plantarflexion with anterior translation): Negative Talar Tilt (CFL, inversion): Negative   TODAY'S TREATMENT: 08/07/2023   Subjective: Pt notes ongoing bilateral hip, knee, and L foot pain. Ankle pain has been slightly better. She notices more pain when ascending stairs. She had hip soreness for 2 days following her last therapy session but felt like eventually the manual therapy helped. Otherwise no specific questions or concerns.    PAIN: 2/10 L knee pain   Ther-ex  NuStep L1-6 x 10 minutes for warm-up and BLE strengthening during interval history; 6" lateral step downs to 2" Airex pad x 10 BLE; FADIR, FABER, and hamstring stretches x 60s each bilaterally; Pball straight knee bridges x 10; Bolster bridges with arms across chest x 10; Attempted L SAQ however pt reports increase in knee pain so discontinued; Hooklying adductor squeezes x 10; Hooklying clams x 10;   Not today:  STM to L ITB with Theraband roller; Single knee to chest stretch x 30s BLE; Heel raises with UE support x 15 (stopped due to pain in hamstring); DF in WB 06/13/23: 10 degrees BLE Ankle pumps 2 x 30s BLE; Seated ankle circumduction 2 x 30s each direction BLE; Seated resisted PF with green tband x 20 BLE; Seated resisted inversion with green tband x 20 BLE; Seated resisted eversion with green tband x 20 BLE; Supine isometric DF 3 x 20s BLE; Supine isometric PF 3 x 20s BLE; Ankle DF stretch 3 x 30s BLE;   PATIENT EDUCATION:  Education details: HEP and plan of care Person educated: Patient Education method: Explanation, Demonstration, and Tactile cues Education comprehension: verbalized understanding and returned demonstration   HOME EXERCISE PROGRAM:  Access Code: [email or web address] URL: [email or web address] Date: 06/13/2023 Prepared by: Ria Comment  Exercises - Seated Ankle Eversion with Resistance  - 1 x  daily - 7 x weekly - 2 sets - 10 reps - 3s hold - Seated Ankle Inversion with Resistance and Legs Crossed (Mirrored)  - 1 x daily - 7 x weekly - 2 sets - 10 reps - 3s hold - Seated Ankle Plantarflexion with Resistance  - 1 x daily - 7 x weekly - 3    sets - 10 reps - 3s hold - Long Sitting Calf Stretch with Strap  - 1 x daily - 7 x weekly - 3 reps - 30-45s hold - Standing Gastroc Stretch on Step  - 1 x daily - 7 x weekly - 3 sets - 10 reps - Standing Dorsiflexion Self-Mobilization on Step  - 1 x daily - 7 x weekly - 3 sets - 10 reps - Standing Dorsiflexion Self-Mobilization on Step (Mirrored)  - 1 x daily - 7 x weekly - 3 sets - 10 reps - Standing on Blue foam x 30 secs with EC -heel raises on Blue foam 20 reps   ASSESSMENT:  CLINICAL IMPRESSION: PT focused on R hip muscles related tightness and weakness. Educated pt about girth weight, fascia tightness and stretches to reduce pai and improve mobility. Continued strengthening exercises for hips and knees as well as  modalities to manage pain and improve tissue extensibility. Primary focus on mat table exercises to minimize pain.HEP updated and provided in H/O.  Pt will benefit from PT in order to improve functional mobility and improve global strengthening.  OBJECTIVE IMPAIRMENTS: decreased strength and hypomobility.   ACTIVITY LIMITATIONS: stairs and locomotion level  PARTICIPATION LIMITATIONS: occupation and yard work  PERSONAL FACTORS: Age, Behavior pattern, Past/current experiences, Time since onset of injury/illness/exacerbation, and 1-2 comorbidities: type II diabetes and left lumbar radiculopathy  are also affecting patient's functional outcome.   REHAB POTENTIAL: Good  CLINICAL DECISION MAKING: Evolving/moderate complexity  EVALUATION COMPLEXITY: Moderate   GOALS: Goals reviewed with patient? Yes  SHORT TERM GOALS: Target date: 06/29/23  Pt will be independent with HEP to improve strength and decrease ankle pain to improve  pain-free function at home and work. Baseline:  Goal status: INITIAL   LONG TERM GOALS: Target date: 08/03/23  1.  Pt will decrease worst ankle pain by at least 3 points on the NPRS in order to demonstrate clinically significant reduction in ankle pain. Baseline: 06/08/23: 1/10 best, 5/10 worst Goal status: INITIAL  2.  Pt will increase FADI score by at least 4 points in order demonstrate clinically significant reduction in ankle pain/disability.       Baseline: 06/13/23: 65% Goal status: INITIAL  3.  Pt will improve strength of ankle inversion/eversion with limited to no pain in order to demonstrate improvement in strength and function.  Baseline: 06/08/23: 5/5 with pain Goal status: INITIAL  4.  Pt will report at least 50% improvement in L ankle and knee symptoms in order to demonstrate improvement in her ability to work in the yard/garden with less pain.  Baseline: Goal status: INITIAL   PLAN: PT FREQUENCY: 1-2x/week  PT DURATION: 12 weeks  PLANNED INTERVENTIONS: Therapeutic exercises, Therapeutic activity, Neuromuscular re-education, Balance training, Gait training, Patient/Family education, Self Care, Joint mobilization, Joint manipulation, Vestibular training, Canalith repositioning, Orthotic/Fit training, DME instructions, Dry Needling, Electrical stimulation, Spinal manipulation, Spinal mobilization, Cryotherapy, Moist heat, Taping, Traction, Ultrasound, Ionotophoresis 4mg /ml Dexamethasone, Manual therapy, and Re-evaluation.  PLAN FOR NEXT SESSION: Update outcome measures/progress note, strengthening and mobility exercises, review and modify HEP as necessary, consider TDN, consider STM to plantar fascia and peroneals,   Sharalyn Ink Viet Kemmerer PT, DPT, GCS  12:55 PM,08/07/23

## 2023-08-07 ENCOUNTER — Ambulatory Visit

## 2023-08-07 DIAGNOSIS — M25572 Pain in left ankle and joints of left foot: Secondary | ICD-10-CM | POA: Diagnosis not present

## 2023-08-07 DIAGNOSIS — R262 Difficulty in walking, not elsewhere classified: Secondary | ICD-10-CM | POA: Diagnosis not present

## 2023-08-07 DIAGNOSIS — K080 Exfoliation of teeth due to systemic causes: Secondary | ICD-10-CM | POA: Diagnosis not present

## 2023-08-07 DIAGNOSIS — M6281 Muscle weakness (generalized): Secondary | ICD-10-CM | POA: Diagnosis not present

## 2023-08-07 DIAGNOSIS — R2681 Unsteadiness on feet: Secondary | ICD-10-CM | POA: Diagnosis not present

## 2023-08-07 DIAGNOSIS — G8929 Other chronic pain: Secondary | ICD-10-CM | POA: Diagnosis not present

## 2023-08-07 DIAGNOSIS — M5459 Other low back pain: Secondary | ICD-10-CM | POA: Diagnosis not present

## 2023-08-07 DIAGNOSIS — M25562 Pain in left knee: Secondary | ICD-10-CM | POA: Diagnosis not present

## 2023-08-11 NOTE — Therapy (Signed)
 OUTPATIENT PHYSICAL THERAPY ANKLE TREATMENT/PROGRESS NOTE  Dates of reporting period  06/08/23   to   08/13/2023  Patient Name: Brandy [name] MRN: [id number] DOB:1955-05-24, 68 y.o., female Today's Date: 08/14/2023  END OF SESSION:  PT End of Session - 08/14/23 0849     Visit Number 10    Number of Visits 25    Date for PT Re-Evaluation 08/31/23    Authorization Type eval: 06/08/23    PT Start Time 0850    PT Stop Time 0930    PT Time Calculation (min) 40 min    Activity Tolerance Patient tolerated treatment well    Behavior During Therapy [facility] for tasks assessed/performed            Past Medical History:  Diagnosis Date   Acute lower GI bleeding 01/19/2020   Arthritis    Atrial fibrillation with rapid ventricular response (HCC)    Chicken pox    Chronic systolic CHF (congestive heart failure) (HCC)    a. 03/2016 Echo: Ef 15-20%, diff HK, ant AK;  b. 05/2016 Echo: Ef 30-35%, diff HK, mildly dil LA/RA.   Diabetes mellitus without complication (HCC)    Dysrhythmia    GERD (gastroesophageal reflux disease)    Heart murmur    Hip discomfort    been going on for 40 years    History of kidney stones    Hyperlipidemia associated with type 2 diabetes mellitus (HCC) 05/12/2021   Hypertension    Moderate mitral regurgitation    a. 03/2016 Echo: mod MR in setting of LV dysfxn.   Motion sickness    car - back seat   NICM (nonischemic cardiomyopathy) (HCC)    a. 03/2016 Echo: EF 15-20%, diff HK, ant AK, mod MR, mod dil LA, mildly dil RA;  b. 04/2016 Cath: nl cors;  c. 05/2016 Echo: EF 30-35%, diff HK.   Obesity    Obstructive sleep apnea    compliant with CPAP   Persistent atrial fibrillation (HCC)    a. Dx 03/2016;  b. CHA2DS2VASc = 2-->Eliquis 5mg  BID;  b. 05/2016 Failed DCCV x 4.   Sleep apnea    Stomach irritation    Visit for monitoring Tikosyn therapy 07/24/2016   Past Surgical History:  Procedure Laterality Date   ATRIAL FIBRILLATION ABLATION N/A 12/09/2020   Procedure:  ATRIAL FIBRILLATION ABLATION;  Surgeon: Lanier Prude, MD;  Location: [facility] INVASIVE CV [facility];  Service: Cardiovascular;  Laterality: N/A;   BIOPSY N/A 01/12/2020   Procedure: BIOPSY;  Surgeon: Pasty Spillers, MD;  Location: [facility];  Service: Endoscopy;  Laterality: N/A;   CARDIAC CATHETERIZATION N/A 04/17/2016   Procedure: Left Heart Cath and Coronary Angiography;  Surgeon: Iran Ouch, MD;  Location: [facility] INVASIVE CV LAB;  Service: Cardiovascular;  Laterality: N/A;   CARDIOVERSION N/A 10/22/2020   Procedure: CARDIOVERSION;  Surgeon: Yvonne Kendall, MD;  Location: [facility] ORS;  Service: Cardiovascular;  Laterality: N/A;   CHOLECYSTECTOMY     COLON SURGERY  2021   COLONOSCOPY N/A 01/20/2020   Procedure: COLONOSCOPY;  Surgeon: Regis Bill, MD;  Location: [facility] ENDOSCOPY;  Service: Endoscopy;  Laterality: N/A;   COLONOSCOPY WITH PROPOFOL N/A 01/12/2020   Procedure: COLONOSCOPY WITH PROPOFOL;  Surgeon: Pasty Spillers, MD;  Location: [facility];  Service: Endoscopy;  Laterality: N/A;  Diabetic - oral meds sleep apnea   ELECTROPHYSIOLOGIC STUDY N/A 05/26/2016   Procedure: Cardioversion;  Surgeon: Iran Ouch, MD;  Location: [facility] ORS;  Service: Cardiovascular;  Laterality: N/A;   ESOPHAGOGASTRODUODENOSCOPY (EGD) WITH PROPOFOL N/A 01/12/2020   Procedure: ESOPHAGOGASTRODUODENOSCOPY (EGD) WITH PROPOFOL;  Surgeon: Pasty Spillers, MD;  Location: [facility];  Service: Endoscopy;  Laterality: N/A;   POLYPECTOMY N/A 01/12/2020   Procedure: POLYPECTOMY;  Surgeon: Pasty Spillers, MD;  Location: [facility];  Service: Endoscopy;  Laterality: N/A;   Patient Active Problem List   Diagnosis Date Noted   Mucous cyst of digit of left hand 05/18/2023   Peroneal tendinitis, left 10/13/2022   Left lumbar radiculopathy 07/26/2022   Arthralgia of left knee 07/26/2022   Pes anserinus bursitis of right knee 04/19/2022   Primary osteoarthritis of  right knee 03/27/2022   Greater 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 03/27/2022   It band syndrome, right 03/27/2022   Hyperlipidemia associated with type 2 diabetes mellitus (HCC) 05/12/2021   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   Varicose veins of leg with pain, bilateral 07/06/2020   Type II diabetes mellitus with complication (HCC) 01/19/2020   Gastric polyp    Colon polyps    Varicose veins of both lower extremities 10/20/2019   Dysphagia 10/20/2019   Osteoarthritis of right hand 10/31/2016   Nonischemic cardiomyopathy (HCC) 04/14/2016   BMI 33.0-33.9,adult 04/14/2016    PCP: Bari Edward, MD  REFERRING PROVIDER: Joseph Berkshire, MD  REFERRING DIAG:  [phone] (ICD-10-CM) - Peroneal tendinitis, left  M54.16 (ICD-10-CM) - Left lumbar radiculopathy  M25.562 (ICD-10-CM) - Arthralgia of left knee  M17.11 (ICD-10-CM) - Primary osteoarthritis of right knee  M76.31 (ICD-10-CM) - It band syndrome, right  M25.551 (ICD-10-CM) - Greater 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   Rationale for Evaluation and Treatment: Rehabilitation  THERAPY DIAG: Muscle weakness (generalized)  Chronic pain of left knee  ONSET DATE: Chronic, aggravated over Christmas (December 2024)  FOLLOW-UP APPT SCHEDULED WITH REFERRING PROVIDER: No, initially no but now has appt for July;  INITIAL EVALUATION: SUBJECTIVE:                                                                                                                                                                                         SUBJECTIVE STATEMENT:  Left ankle and foot pain  PERTINENT HISTORY: Pt reports pain in left ankle that seems to have "flared up over Christmas" after pt reported excess walking. Pt reports feeling "pulling and burning at the base of the lateral ankle." She has previously been to PT for pain in her left hip and knee but has now noticed increased pain in the left ankle/foot.  Pt expresses difficulty with exercises; specifically pressure on the toes, stretching calves, and rocking on toes. Pt  notes pain is as high as 5/10 NPS but is constant and "never really goes away," with lowest pain at 1/10 NPS.  PAIN:    Pain Intensity: Present: 1/10, Best: 1/10, Worst: 5/10 Pain location: Lateral foot and ankle Pain Quality: constant and burning  Radiating: Yes  Swelling: No  Popping, catching, locking: No  Numbness/Tingling: No Focal Weakness: Yes Aggravating factors: Touching lateral ankle, moving the ankle, ascending steps when leading with left foot, walking Relieving factors: Stopping activity, resting, brace temporarily relieves some pain 24-hour pain behavior: No night pain when sleeping on back, stretching in AM helps but feels unstable, PM pain depends on activity during the day History of prior back, hip, knee, or ankle injury, pain, surgery, or therapy: Yes; chronic hip, knee, foot pain Dominant hand: right Imaging: No  Typical footwear: Sneakers or rubber boots Red flags: Negative for personal history of cancer, chills/fever, night sweats, nausea, vomiting, unrelenting pain): Negative  PRECAUTIONS: None  WEIGHT BEARING RESTRICTIONS: No  FALLS: Has patient fallen in last 6 months? 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  Recent memory is intact.  Remote memory is intact.  Attention span and concentration are intact.  Expressive speech is intact.  Patient's fund of knowledge is within normal limits for educational level.    Gross Musculoskeletal Assessment Bulk: Normal Tone: Normal No trophic changes noted to foot/ankle. No ecchymosis, erythema, or edema noted. No gross ankle/foot deformity noted  GAIT: Comments: Mild toe out gait on right foot. Antalgic gait noted on left side.  Posture: No noted discrepancies.  AROM AROM (Normal range in degrees) AROM       Hip Right Left  Flexion (125) WNL WNL  Extension (15) WNL WNL  Abduction (40) WNL WNL  Adduction  WNL WNL  Internal Rotation (45) WNL WNL  External Rotation (45) WNL WNL      Knee    Flexion (135) WNL WNL  Extension (0) WNL WNL      Ankle    Dorsiflexion (20) 10 (WB) 10 (WB)  Plantarflexion (50) 40 35  Inversion (35) 30 20  Eversion (15) 7 8  (* = pain; Blank rows = not tested)   LE MMT: MMT (out of 5) Right  Left   Hip flexion 4 4  Hip extension 4 4  Hip abduction 4 4+  Hip adduction 4+ 4  Hip internal rotation 5 5  Hip external rotation 5 5  Knee flexion 4 4  Knee extension 5 5  Ankle dorsiflexion 5 5  Ankle plantarflexion    Ankle inversion 5 5*  Ankle eversion 5 5*  (* = pain; Blank rows = not tested)  Sensation Diminished sensation left S1 and lateral aspect of left foot.  Reflexes Deferred  Palpation Location LEFT  RIGHT           Plantar fascia 1 0  Dorsal aspect of foot 1 0  Tarsal tunnel 1 0  (Blank rows = not tested) Graded on 0-4 scale (0 = no pain, 1 = pain, 2 = pain with wincing/grimacing/flinching, 3 = pain with withdrawal, 4 = unwilling to allow palpation), (Blank rows = not tested)  Passive Accessory Motion Superior Tibiofibular Joint: WNL Inferior Tibiofibular Joint: WNL Talocrural Joint Distraction: WNL but noted pain with "pulling feeling" with  left foot Talocrural Joint AP: L hypomobile, painful where hand is purchased Talocrural Joint PA: L hypomobile  SPECIAL TESTS Ligamentous Integrity Anterior Drawer (ATF, 10-15 plantarflexion with anterior translation): Negative Talar Tilt (CFL, inversion): Negative   TODAY'S TREATMENT: 08/14/2023   Subjective: Pt notes ongoing bilateral hip pain, worse on the R side. Overall she reports at least 75% improvement in L ankle pain since starting with therapy. No specific questions or concerns.    PAIN: Not rated   Ther-ex  NuStep L1-4 x 8 minutes for warm-up and BLE strengthening during interval history; Updated outcome measures with patient: FADI: FADI Activities Score: 80 / 104 or 77% Worst ankle pain: 1/10; Ankle inversion/eversion strength: 5/5 and painfree % improvement: 75% improvement  Supine SLR with 2# AW 2 x 10 BLE; Hooklying clams with manual resistance from therapist x 10; Adductor squeezes with manual resistance from therapist x 10; SAQ with manual resistance from therapist x 10 BLE; Hooklying bridges x 10; Updated goals for hip;   Not today:  STM to L ITB with Theraband roller; Single knee to chest stretch x 30s BLE; Heel raises with UE support x 15 (stopped due to pain in hamstring); DF in WB 06/13/23: 10 degrees BLE Ankle pumps 2 x 30s BLE; Seated ankle circumduction 2 x 30s each direction BLE; Seated resisted PF with green tband x 20 BLE; Seated resisted inversion with green tband x 20 BLE; Seated resisted eversion with green tband x 20 BLE; Supine isometric DF 3 x 20s BLE; Supine isometric PF 3 x 20s BLE; Ankle DF stretch 3 x 30s BLE; 6" lateral step downs to 2" Airex pad x 10 BLE; FADIR, FABER, and hamstring stretches x 60s each bilaterally; Pball straight knee bridges x 10;   PATIENT EDUCATION:  Education details: HEP and plan of care Person educated: Patient Education method: Explanation, Demonstration, and Tactile cues Education comprehension:  verbalized understanding and returned demonstration   HOME EXERCISE PROGRAM:  Access Code: [email or web address] URL: [email or web address] Date: 06/13/2023 Prepared by: Ria Comment  Exercises - Seated Ankle Eversion with Resistance  - 1 x daily - 7 x weekly - 2 sets - 10 reps - 3s hold - Seated Ankle Inversion with Resistance and Legs Crossed (Mirrored)  - 1 x daily - 7 x weekly - 2 sets - 10 reps - 3s hold - Seated Ankle Plantarflexion with Resistance  - 1 x daily - 7 x weekly - 3    sets - 10 reps - 3s hold - Long Sitting Calf Stretch with Strap  - 1 x daily - 7 x weekly - 3 reps - 30-45s hold - Standing Gastroc Stretch on Step  - 1 x daily - 7 x weekly - 3 sets - 10 reps - Standing Dorsiflexion Self-Mobilization on Step  - 1 x daily - 7 x weekly - 3 sets - 10 reps - Standing Dorsiflexion Self-Mobilization on Step (Mirrored)  - 1 x daily - 7 x weekly - 3 sets - 10 reps - Standing on Blue foam x 30 secs with EC -heel raises on  Blue foam 20 reps   ASSESSMENT:  CLINICAL IMPRESSION: Updated outcome measures with patient during visit today. Overall she reports approximately 75% improvement in ankle symptoms since starting with therapy. Her FADI improved from 65% at the initial evaluation to 77% today. She demonstrates 5/5 L ankle inversion and eversion strength without pain and her worse ankle pain over the last week has only increased to 1/10. Pt reports ongoing bilateral hip pain, worse on the R side compared to the L. She completed the HAGOS questionnaire and scored 37.7% (Symptoms Score: 50%; Pain Score: 37.5%; Physical Function, Daily Living Score: 45%; Function, Sports, and Recreational Activities Score: 43.7%; Participation in Physical Activities Score: 25%; Quality of Life Score: 25 %). Additional time during today's session focused on strengthening. Pt will benefit from PT services to address deficits in strength and pain in order to return to full function at home and with leisure  activities.   OBJECTIVE IMPAIRMENTS: decreased strength and hypomobility.   ACTIVITY LIMITATIONS: stairs and locomotion level  PARTICIPATION LIMITATIONS: occupation and yard work  PERSONAL FACTORS: Age, Behavior pattern, Past/current experiences, Time since onset of injury/illness/exacerbation, and 1-2 comorbidities: type II diabetes and left lumbar radiculopathy  are also affecting patient's functional outcome.   REHAB POTENTIAL: Good  CLINICAL DECISION MAKING: Evolving/moderate complexity  EVALUATION COMPLEXITY: Moderate   GOALS: Goals reviewed with patient? Yes  SHORT TERM GOALS: Target date: 06/29/23  Pt will be independent with HEP to improve strength and decrease ankle pain to improve pain-free function at home and work. Baseline:  Goal status: INITIAL   LONG TERM GOALS: Target date: 08/03/23  1.  Pt will decrease worst ankle pain by at least 3 points on the NPRS in order to demonstrate clinically significant reduction in ankle pain. Baseline: 06/08/23: 1/10 best, 5/10 worst; 08/14/23: worst: 1/10; Goal status: ACHIEVED  2.  Pt will increase FADI score by at least 4 points in order demonstrate clinically significant reduction in ankle pain/disability.       Baseline: 06/13/23: 65%; 08/14/23: 80 / 104 or 77% Goal status: ACHIEVED  3.  Pt will improve strength of ankle inversion/eversion with limited to no pain in order to demonstrate improvement in strength and function.  Baseline: 06/08/23: 5/5 with pain; 08/14/23: 5/5 without pain Goal status: ACHIEVED  4.  Pt will report at least 50% improvement in L ankle and knee symptoms in order to demonstrate improvement in her ability to work in the yard/garden with less pain.  Baseline: 08/14/23: 75% improvement Goal status: ACHIEVED  5.  Pt will decrease HAGOS score by at least 20 points in order demonstrate clinically significant reduction in hip pain/disability.  Baseline: 08/14/23: HAGOS Score: 37.7% (Symptoms Score: 50%; Pain Score:  37.5%; Physical Function, Daily Living Score: 45%; Function, Sports, and Recreational Activities Score: 43.7%; Participation in Physical Activities Score: 25%; Quality of Life Score: 25 %) Goal status: INITIAL  6.  Pt will decrease worst posterior R hip pain by at least 3 points on the NPRS in order to demonstrate clinically significant reduction in hip pain. Baseline: 08/14/23: worst: 8/10; Goal status: INITIAL   PLAN: PT FREQUENCY: 1-2x/week  PT DURATION: 12 weeks  PLANNED INTERVENTIONS: Therapeutic exercises, Therapeutic activity, Neuromuscular re-education, Balance training, Gait training, Patient/Family education, Self Care, Joint mobilization, Joint manipulation, Vestibular training, Canalith repositioning, Orthotic/Fit training, DME instructions, Dry Needling, Electrical stimulation, Spinal manipulation, Spinal mobilization, Cryotherapy, Moist heat, Taping, Traction, Ultrasound, Ionotophoresis 4mg /ml Dexamethasone, Manual therapy, and Re-evaluation.  PLAN FOR NEXT SESSION: strengthening and mobility exercises  focused on R hip pain, review and modify HEP as necessary  Madicyn Mesina D Kamdyn Covel PT, DPT, GCS  1:08 PM,08/14/23

## 2023-08-13 ENCOUNTER — Ambulatory Visit (INDEPENDENT_AMBULATORY_CARE_PROVIDER_SITE_OTHER): Payer: Medicare Other

## 2023-08-13 DIAGNOSIS — I4819 Other persistent atrial fibrillation: Secondary | ICD-10-CM | POA: Diagnosis not present

## 2023-08-13 LAB — CUP PACEART REMOTE DEVICE CHECK
Date Time Interrogation Session: 20250330231240
Implantable Pulse Generator Implant Date: 20230928

## 2023-08-14 ENCOUNTER — Ambulatory Visit: Attending: Family Medicine

## 2023-08-14 DIAGNOSIS — M6281 Muscle weakness (generalized): Secondary | ICD-10-CM | POA: Insufficient documentation

## 2023-08-14 DIAGNOSIS — G8929 Other chronic pain: Secondary | ICD-10-CM | POA: Insufficient documentation

## 2023-08-14 DIAGNOSIS — M25562 Pain in left knee: Secondary | ICD-10-CM | POA: Insufficient documentation

## 2023-08-14 DIAGNOSIS — M25551 Pain in right hip: Secondary | ICD-10-CM | POA: Diagnosis not present

## 2023-08-15 ENCOUNTER — Other Ambulatory Visit: Payer: Self-pay | Admitting: Internal Medicine

## 2023-08-15 NOTE — Progress Notes (Signed)
 [facility] Summary Report / Loop Recorder

## 2023-08-15 NOTE — Addendum Note (Signed)
 Addended by: Geralyn Flash D on: 08/15/2023 11:04 AM   Modules accepted: Orders

## 2023-08-16 NOTE — Telephone Encounter (Signed)
 Requested Prescriptions  Pending Prescriptions Disp Refills   metoprolol tartrate (LOPRESSOR) 50 MG tablet [Pharmacy Med Name: Metoprolol Tartrate 50 MG Oral Tablet] 90 tablet 0    Sig: Take 1/2 (one-half) tablet by mouth twice daily     Cardiovascular:  Beta Blockers Failed - 08/16/2023  2:25 PM      Failed - Last BP in normal range    BP Readings from Last 1 Encounters:  05/21/23 (!) 140/78         Failed - Valid encounter within last 6 months    Recent Outpatient Visits   None     Future Appointments             In 4 weeks Judithann Graves, Nyoka Cowden, MD [facility] at [facility], [facility]   In 3 months [name], Ocie Bob, MD [facility] at [facility], [facility]            Passed - Last Heart Rate in normal range    Pulse Readings from Last 1 Encounters:  05/21/23 84

## 2023-08-20 NOTE — Therapy (Signed)
 OUTPATIENT PHYSICAL THERAPY ANKLE/KNEE/HIP TREATMENT  Patient Name: Brandy [name] MRN: [id number] DOB:1955-09-28, 68 y.o., female Today's Date: 08/21/2023  END OF SESSION:  PT End of Session - 08/21/23 0849     Visit Number 11    Number of Visits 25    Date for PT Re-Evaluation 08/31/23    Authorization Type eval: 06/08/23    PT Start Time 0848    PT Stop Time 0930    PT Time Calculation (min) 42 min    Activity Tolerance Patient tolerated treatment well    Behavior During Therapy [facility] for tasks assessed/performed            Past Medical History:  Diagnosis Date   Acute lower GI bleeding 01/19/2020   Arthritis    Atrial fibrillation with rapid ventricular response (HCC)    Chicken pox    Chronic systolic CHF (congestive heart failure) (HCC)    a. 03/2016 Echo: Ef 15-20%, diff HK, ant AK;  b. 05/2016 Echo: Ef 30-35%, diff HK, mildly dil LA/RA.   Diabetes mellitus without complication (HCC)    Dysrhythmia    GERD (gastroesophageal reflux disease)    Heart murmur    Hip discomfort    been going on for 40 years    History of kidney stones    Hyperlipidemia associated with type 2 diabetes mellitus (HCC) 05/12/2021   Hypertension    Moderate mitral regurgitation    a. 03/2016 Echo: mod MR in setting of LV dysfxn.   Motion sickness    car - back seat   NICM (nonischemic cardiomyopathy) (HCC)    a. 03/2016 Echo: EF 15-20%, diff HK, ant AK, mod MR, mod dil LA, mildly dil RA;  b. 04/2016 Cath: nl cors;  c. 05/2016 Echo: EF 30-35%, diff HK.   Obesity    Obstructive sleep apnea    compliant with CPAP   Persistent atrial fibrillation (HCC)    a. Dx 03/2016;  b. CHA2DS2VASc = 2-->Eliquis 5mg  BID;  b. 05/2016 Failed DCCV x 4.   Sleep apnea    Stomach irritation    Visit for monitoring Tikosyn therapy 07/24/2016   Past Surgical History:  Procedure Laterality Date   ATRIAL FIBRILLATION ABLATION N/A 12/09/2020   Procedure: ATRIAL FIBRILLATION ABLATION;  Surgeon: Lanier Prude, MD;   Location: [facility] INV[facility]IVE [facility];  Service: Cardiovascular;  Laterality: N/A;   BIOPSY N/A 01/12/2020   Procedure: BIOPSY;  Surgeon: Pasty Spillers, MD;  Location: [facility];  Service: Endoscopy;  Laterality: N/A;   CARDIAC CATHETERIZATION N/A 04/17/2016   Procedure: Left Heart Cath and Coronary Angiography;  Surgeon: Iran Ouch, MD;  Location: [facility] INVASIVE CV LAB;  Service: Cardiovascular;  Laterality: N/A;   CARDIOVERSION N/A 10/22/2020   Procedure: CARDIOVERSION;  Surgeon: Yvonne Kendall, MD;  Location: [facility] ORS;  Service: Cardiovascular;  Laterality: N/A;   CHOLECYSTECTOMY     COLON SURGERY  2021   COLONOSCOPY N/A 01/20/2020   Procedure: COLONOSCOPY;  Surgeon: Regis Bill, MD;  Location: [facility] ENDOSCOPY;  Service: Endoscopy;  Laterality: N/A;   COLONOSCOPY WITH PROPOFOL N/A 01/12/2020   Procedure: COLONOSCOPY WITH PROPOFOL;  Surgeon: Pasty Spillers, MD;  Location: [facility];  Service: Endoscopy;  Laterality: N/A;  Diabetic - oral meds sleep apnea   ELECTROPHYSIOLOGIC STUDY N/A 05/26/2016   Procedure: Cardioversion;  Surgeon: Iran Ouch, MD;  Location: [facility] ORS;  Service: Cardiovascular;  Laterality: N/A;   ESOPHAGOGASTRODUODENOSCOPY (EGD) WITH PROPOFOL N/A 01/12/2020   Procedure:  ESOPHAGOGASTRODUODENOSCOPY (EGD) WITH PROPOFOL;  Surgeon: Pasty Spillers, MD;  Location: [facility];  Service: Endoscopy;  Laterality: N/A;   POLYPECTOMY N/A 01/12/2020   Procedure: POLYPECTOMY;  Surgeon: Pasty Spillers, MD;  Location: [facility];  Service: Endoscopy;  Laterality: N/A;   Patient Active Problem List   Diagnosis Date Noted   Mucous cyst of digit of left hand 05/18/2023   Peroneal tendinitis, left 10/13/2022   Left lumbar radiculopathy 07/26/2022   Arthralgia of left knee 07/26/2022   Pes anserinus bursitis of right knee 04/19/2022   Primary osteoarthritis of right knee 03/27/2022   Greater trochanteric pain syndrome of  right lower extremity 03/27/2022   It band syndrome, right 03/27/2022   Hyperlipidemia associated with type 2 diabetes mellitus (HCC) 05/12/2021   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   Varicose veins of leg with pain, bilateral 07/06/2020   Type II diabetes mellitus with complication (HCC) 01/19/2020   Gastric polyp    Colon polyps    Varicose veins of both lower extremities 10/20/2019   Dysphagia 10/20/2019   Osteoarthritis of right hand 10/31/2016   Nonischemic cardiomyopathy (HCC) 04/14/2016   BMI 33.0-33.9,adult 04/14/2016    PCP: Bari Edward, MD  REFERRING PROVIDER: Joseph Berkshire, MD  REFERRING DIAG:  [phone] (ICD-10-CM) - Peroneal tendinitis, left  M54.16 (ICD-10-CM) - Left lumbar radiculopathy  M25.562 (ICD-10-CM) - Arthralgia of left knee  M17.11 (ICD-10-CM) - Primary osteoarthritis of right knee  M76.31 (ICD-10-CM) - It band syndrome, right  M25.551 (ICD-10-CM) - Greater 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   Rationale for Evaluation and Treatment: Rehabilitation  THERAPY DIAG: Muscle weakness (generalized)  Pain in right hip  ONSET DATE: Chronic, aggravated over Christmas (December 2024)  FOLLOW-UP APPT SCHEDULED WITH REFERRING PROVIDER: No, initially no but now has appt for July;  INITIAL EVALUATION: SUBJECTIVE:                                                                                                                                                                                         SUBJECTIVE STATEMENT:  Left ankle and foot pain  PERTINENT HISTORY: Pt reports pain in left ankle that seems to have "flared up over Christmas" after pt reported excess walking. Pt reports feeling "pulling and burning at the base of the lateral ankle." She has previously been to PT for pain in her left hip and knee but has now noticed increased pain in the left ankle/foot. Pt expresses difficulty with exercises; specifically pressure on the  toes, stretching calves, and rocking on toes. Pt notes pain is as high as 5/10 NPS but is constant and "never really  goes away," with lowest pain at 1/10 NPS.  PAIN:    Pain Intensity: Present: 1/10, Best: 1/10, Worst: 5/10 Pain location: Lateral foot and ankle Pain Quality: constant and burning  Radiating: Yes  Swelling: No  Popping, catching, locking: No  Numbness/Tingling: No Focal Weakness: Yes Aggravating factors: Touching lateral ankle, moving the ankle, ascending steps when leading with left foot, walking Relieving factors: Stopping activity, resting, brace temporarily relieves some pain 24-hour pain behavior: No night pain when sleeping on back, stretching in AM helps but feels unstable, PM pain depends on activity during the day History of prior back, hip, knee, or ankle injury, pain, surgery, or therapy: Yes; chronic hip, knee, foot pain Dominant hand: right Imaging: No  Typical footwear: Sneakers or rubber boots Red flags: Negative for personal history of cancer, chills/fever, night sweats, nausea, vomiting, unrelenting pain): Negative  PRECAUTIONS: None  WEIGHT BEARING RESTRICTIONS: No  FALLS: Has patient fallen in last 6 months? 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  Recent memory is intact.  Remote memory is intact.  Attention span and concentration are intact.  Expressive speech is intact.  Patient's fund of knowledge is within normal limits for educational level.    Gross Musculoskeletal Assessment Bulk: Normal Tone: Normal No trophic changes noted to foot/ankle. No ecchymosis, erythema, or edema noted. No gross ankle/foot deformity noted  GAIT: Comments: Mild toe out gait on right foot. Antalgic gait noted on left side.  Posture: No noted discrepancies.  AROM AROM (Normal range in degrees) AROM       Hip Right Left  Flexion (125) WNL WNL  Extension (15) WNL WNL  Abduction (40) WNL WNL  Adduction  WNL WNL  Internal Rotation (45) WNL WNL  External Rotation (45) WNL WNL      Knee    Flexion (135) WNL WNL  Extension (0) WNL WNL      Ankle    Dorsiflexion (20) 10 (WB) 10 (WB)  Plantarflexion (50) 40 35  Inversion (35) 30 20  Eversion (15) 7 8  (* = pain; Blank rows = not tested)   LE MMT: MMT (out of 5) Right  Left   Hip flexion 4 4  Hip extension 4 4  Hip abduction 4 4+  Hip adduction 4+ 4  Hip internal rotation 5 5  Hip external rotation 5 5  Knee flexion 4 4  Knee extension 5 5  Ankle dorsiflexion 5 5  Ankle plantarflexion    Ankle inversion 5 5*  Ankle eversion 5 5*  (* = pain; Blank rows = not tested)  Sensation Diminished sensation left S1 and lateral aspect of left foot.  Reflexes Deferred  Palpation Location LEFT  RIGHT           Plantar fascia 1 0  Dorsal aspect of foot 1 0  Tarsal tunnel 1 0  (Blank rows = not tested) Graded on 0-4 scale (0 = no pain, 1 = pain, 2 = pain with wincing/grimacing/flinching, 3 = pain with withdrawal, 4 = unwilling to allow palpation), (Blank rows = not tested)  Passive Accessory Motion Superior Tibiofibular Joint: WNL Inferior Tibiofibular Joint: WNL Talocrural Joint Distraction: WNL but noted pain with "pulling feeling" with left foot Talocrural  Joint AP: L hypomobile, painful where hand is purchased Talocrural Joint PA: L hypomobile  SPECIAL TESTS Ligamentous Integrity Anterior Drawer (ATF, 10-15 plantarflexion with anterior translation): Negative Talar Tilt (CFL, inversion): Negative   TODAY'S TREATMENT: 08/21/2023   Subjective: Pt notes ongoing bilateral hip pain, but slightly better than last week. No resting R hip pain upon arrival today but she does have pain with palpation of right hip. No specific questions or concerns.    PAIN: No resting pain;   Ther-ex  NuStep L1-4 x 8 minutes for warm-up and BLE strengthening during interval history; Hooklying isometric clam with belt around knees 10s hold x 10; Hooklying isometric ball adductor squeeze 10s hold x 10; R hip long axis isometric abduction 10s hold x 10; Hooklying R SLR x 10; Hooklying bridges x 10;   Manual Therapy  L sidelying R lateral hip and thigh STM with Theraband roller; R hip FADIR stretch x 30s;   Not today:  Single knee to chest stretch x 30s BLE; Heel raises with UE support x 15 (stopped due to pain in hamstring); DF in WB 06/13/23: 10 degrees BLE Ankle pumps 2 x 30s BLE; Seated ankle circumduction 2 x 30s each direction BLE; Seated resisted PF with green tband x 20 BLE; Seated resisted inversion with green tband x 20 BLE; Seated resisted eversion with green tband x 20 BLE; Supine isometric DF 3 x 20s BLE; Supine isometric PF 3 x 20s BLE; Ankle DF stretch 3 x 30s BLE; 6" lateral step downs to 2" Airex pad x 10 BLE; Pball straight knee bridges x 10;   PATIENT EDUCATION:  Education details: HEP and plan of care Person educated: Patient Education method: Explanation, Demonstration, and Tactile cues Education comprehension: verbalized understanding and returned demonstration   HOME EXERCISE PROGRAM:  Access Code: [email or web address] URL: [email or web address] Date: 06/13/2023 Prepared by: Ria Comment  Exercises - Seated Ankle  Eversion with Resistance  - 1 x daily - 7 x weekly - 2 sets - 10 reps - 3s hold - Seated Ankle Inversion with Resistance and Legs Crossed (Mirrored)  - 1 x daily - 7 x weekly - 2 sets - 10 reps - 3s hold - Seated Ankle Plantarflexion with Resistance  - 1 x daily - 7 x weekly - 3    sets - 10 reps - 3s hold - Long Sitting Calf Stretch with Strap  - 1 x daily - 7 x weekly - 3 reps - 30-45s hold - Standing Gastroc Stretch on Step  - 1 x daily - 7 x weekly - 3 sets - 10 reps - Standing Dorsiflexion Self-Mobilization on Step  - 1 x daily - 7 x weekly - 3 sets - 10 reps - Standing Dorsiflexion Self-Mobilization on Step (Mirrored)  - 1 x daily - 7 x weekly - 3 sets - 10 reps - Standing on Blue foam x 30 secs with EC -heel raises on Blue foam 20 reps   ASSESSMENT:  CLINICAL IMPRESSION: Session focused on strengthening and manual techniques for R hip pain. Introduced isometrics for R hip ER and abduction. No  HEP modifications today but will plan to progress at next visit. Pt encouraged to follow-up as scheduled. She will benefit from PT services to address deficits in strength and pain in order to return to full function at home and with leisure activities.   OBJECTIVE IMPAIRMENTS: decreased strength and hypomobility.   ACTIVITY LIMITATIONS: stairs and locomotion level  PARTICIPATION LIMITATIONS: occupation and yard work  PERSONAL FACTORS: Age, Behavior pattern, Past/current experiences, Time since onset of injury/illness/exacerbation, and 1-2 comorbidities: type II diabetes and left lumbar radiculopathy  are also affecting patient's functional outcome.   REHAB POTENTIAL: Good  CLINICAL DECISION MAKING: Evolving/moderate complexity  EVALUATION COMPLEXITY: Moderate   GOALS: Goals reviewed with patient? Yes  SHORT TERM GOALS: Target date: 06/29/23  Pt will be independent with HEP to improve strength and decrease ankle pain to improve pain-free function at home and work. Baseline:  Goal  status: INITIAL   LONG TERM GOALS: Target date: 08/03/23  1.  Pt will decrease worst ankle pain by at least 3 points on the NPRS in order to demonstrate clinically significant reduction in ankle pain. Baseline: 06/08/23: 1/10 best, 5/10 worst; 08/14/23: worst: 1/10; Goal status: ACHIEVED  2.  Pt will increase FADI score by at least 4 points in order demonstrate clinically significant reduction in ankle pain/disability.       Baseline: 06/13/23: 65%; 08/14/23: 80 / 104 or 77% Goal status: ACHIEVED  3.  Pt will improve strength of ankle inversion/eversion with limited to no pain in order to demonstrate improvement in strength and function.  Baseline: 06/08/23: 5/5 with pain; 08/14/23: 5/5 without pain Goal status: ACHIEVED  4.  Pt will report at least 50% improvement in L ankle and knee symptoms in order to demonstrate improvement in her ability to work in the yard/garden with less pain.  Baseline: 08/14/23: 75% improvement Goal status: ACHIEVED  5.  Pt will decrease HAGOS score by at least 20 points in order demonstrate clinically significant reduction in hip pain/disability.  Baseline: 08/14/23: HAGOS Score: 37.7% (Symptoms Score: 50%; Pain Score: 37.5%; Physical Function, Daily Living Score: 45%; Function, Sports, and Recreational Activities Score: 43.7%; Participation in Physical Activities Score: 25%; Quality of Life Score: 25 %) Goal status: INITIAL  6.  Pt will decrease worst posterior R hip pain by at least 3 points on the NPRS in order to demonstrate clinically significant reduction in hip pain. Baseline: 08/14/23: worst: 8/10; Goal status: INITIAL   PLAN: PT FREQUENCY: 1-2x/week  PT DURATION: 12 weeks  PLANNED INTERVENTIONS: Therapeutic exercises, Therapeutic activity, Neuromuscular re-education, Balance training, Gait training, Patient/Family education, Self Care, Joint mobilization, Joint manipulation, Vestibular training, Canalith repositioning, Orthotic/Fit training, DME instructions,  Dry Needling, Electrical stimulation, Spinal manipulation, Spinal mobilization, Cryotherapy, Moist heat, Taping, Traction, Ultrasound, Ionotophoresis 4mg /ml Dexamethasone, Manual therapy, and Re-evaluation.  PLAN FOR NEXT SESSION: strengthening and mobility exercises focused on R hip pain, review and modify HEP as necessary  Sharalyn Ink Areya Lemmerman PT, DPT, GCS  12:25 PM,08/21/23

## 2023-08-21 ENCOUNTER — Ambulatory Visit

## 2023-08-21 DIAGNOSIS — M25551 Pain in right hip: Secondary | ICD-10-CM | POA: Diagnosis not present

## 2023-08-21 DIAGNOSIS — M6281 Muscle weakness (generalized): Secondary | ICD-10-CM | POA: Diagnosis not present

## 2023-08-21 DIAGNOSIS — G8929 Other chronic pain: Secondary | ICD-10-CM | POA: Diagnosis not present

## 2023-08-21 DIAGNOSIS — K080 Exfoliation of teeth due to systemic causes: Secondary | ICD-10-CM | POA: Diagnosis not present

## 2023-08-21 DIAGNOSIS — M25562 Pain in left knee: Secondary | ICD-10-CM | POA: Diagnosis not present

## 2023-08-28 ENCOUNTER — Ambulatory Visit

## 2023-08-28 DIAGNOSIS — M6281 Muscle weakness (generalized): Secondary | ICD-10-CM

## 2023-08-28 DIAGNOSIS — G8929 Other chronic pain: Secondary | ICD-10-CM | POA: Diagnosis not present

## 2023-08-28 DIAGNOSIS — M25551 Pain in right hip: Secondary | ICD-10-CM

## 2023-08-28 DIAGNOSIS — M25562 Pain in left knee: Secondary | ICD-10-CM | POA: Diagnosis not present

## 2023-08-28 NOTE — Therapy (Signed)
 OUTPATIENT PHYSICAL THERAPY ANKLE/KNEE/HIP TREATMENT  Patient Name: Brandy [name] MRN: [id number] DOB:07-15-1955, 68 y.o., female Today's Date: 08/28/2023  END OF SESSION:  PT End of Session - 08/28/23 0846     Visit Number 12    Number of Visits 25    Date for PT Re-Evaluation 08/31/23    Authorization Type eval: 06/08/23    PT Start Time 0850    PT Stop Time 0935    PT Time Calculation (min) 45 min    Activity Tolerance Patient tolerated treatment well    Behavior During Therapy [facility] for tasks assessed/performed            Past Medical History:  Diagnosis Date   Acute lower GI bleeding 01/19/2020   Arthritis    Atrial fibrillation with rapid ventricular response (HCC)    Chicken pox    Chronic systolic CHF (congestive heart failure) (HCC)    a. 03/2016 Echo: Ef 15-20%, diff HK, ant AK;  b. 05/2016 Echo: Ef 30-35%, diff HK, mildly dil LA/RA.   Diabetes mellitus without complication (HCC)    Dysrhythmia    GERD (gastroesophageal reflux disease)    Heart murmur    Hip discomfort    been going on for 40 years    History of kidney stones    Hyperlipidemia associated with type 2 diabetes mellitus (HCC) 05/12/2021   Hypertension    Moderate mitral regurgitation    a. 03/2016 Echo: mod MR in setting of LV dysfxn.   Motion sickness    car - back seat   NICM (nonischemic cardiomyopathy) (HCC)    a. 03/2016 Echo: EF 15-20%, diff HK, ant AK, mod MR, mod dil LA, mildly dil RA;  b. 04/2016 Cath: nl cors;  c. 05/2016 Echo: EF 30-35%, diff HK.   Obesity    Obstructive sleep apnea    compliant with CPAP   Persistent atrial fibrillation (HCC)    a. Dx 03/2016;  b. CHA2DS2VASc = 2-->Eliquis 5mg  BID;  b. 05/2016 Failed DCCV x 4.   Sleep apnea    Stomach irritation    Visit for monitoring Tikosyn therapy 07/24/2016   Past Surgical History:  Procedure Laterality Date   ATRIAL FIBRILLATION ABLATION N/A 12/09/2020   Procedure: ATRIAL FIBRILLATION ABLATION;  Surgeon: Lanier Prude, MD;   Location: [facility];  Service: Cardiovascular;  Laterality: N/A;   BIOPSY N/A 01/12/2020   Procedure: BIOPSY;  Surgeon: Pasty Spillers, MD;  Location: [facility];  Service: Endoscopy;  Laterality: N/A;   CARDIAC CATHETERIZATION N/A 04/17/2016   Procedure: Left Heart Cath and Coronary Angiography;  Surgeon: Iran Ouch, MD;  Location: [facility] INVASIVE CV LAB;  Service: Cardiovascular;  Laterality: N/A;   CARDIOVERSION N/A 10/22/2020   Procedure: CARDIOVERSION;  Surgeon: Yvonne Kendall, MD;  Location: [facility] ORS;  Service: Cardiovascular;  Laterality: N/A;   CHOLECYSTECTOMY     COLON SURGERY  2021   COLONOSCOPY N/A 01/20/2020   Procedure: COLONOSCOPY;  Surgeon: Regis Bill, MD;  Location: [facility] ENDOSCOPY;  Service: Endoscopy;  Laterality: N/A;   COLONOSCOPY WITH PROPOFOL N/A 01/12/2020   Procedure: COLONOSCOPY WITH PROPOFOL;  Surgeon: Pasty Spillers, MD;  Location: [facility];  Service: Endoscopy;  Laterality: N/A;  Diabetic - oral meds sleep apnea   ELECTROPHYSIOLOGIC STUDY N/A 05/26/2016   Procedure: Cardioversion;  Surgeon: Iran Ouch, MD;  Location: [facility] ORS;  Service: Cardiovascular;  Laterality: N/A;   ESOPHAGOGASTRODUODENOSCOPY (EGD) WITH PROPOFOL N/A 01/12/2020   Procedure:  ESOPHAGOGASTRODUODENOSCOPY (EGD) WITH PROPOFOL;  Surgeon: Irby Mannan, MD;  Location: [facility];  Service: Endoscopy;  Laterality: N/A;   POLYPECTOMY N/A 01/12/2020   Procedure: POLYPECTOMY;  Surgeon: Irby Mannan, MD;  Location: [facility];  Service: Endoscopy;  Laterality: N/A;   Patient Active Problem List   Diagnosis Date Noted   Mucous cyst of digit of left hand 05/18/2023   Peroneal tendinitis, left 10/13/2022   Left lumbar radiculopathy 07/26/2022   Arthralgia of left knee 07/26/2022   Pes anserinus bursitis of right knee 04/19/2022   Primary osteoarthritis of right knee 03/27/2022   Greater trochanteric pain syndrome of  right lower extremity 03/27/2022   It band syndrome, right 03/27/2022   Hyperlipidemia associated with type 2 diabetes mellitus (HCC) 05/12/2021   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   Varicose veins of leg with pain, bilateral 07/06/2020   Type II diabetes mellitus with complication (HCC) 01/19/2020   Gastric polyp    Colon polyps    Varicose veins of both lower extremities 10/20/2019   Dysphagia 10/20/2019   Osteoarthritis of right hand 10/31/2016   Nonischemic cardiomyopathy (HCC) 04/14/2016   BMI 33.0-33.9,adult 04/14/2016    PCP: Janna Melter, MD  REFERRING PROVIDER: Rebekah Canada, MD  REFERRING DIAG:  [phone] (ICD-10-CM) - Peroneal tendinitis, left  M54.16 (ICD-10-CM) - Left lumbar radiculopathy  M25.562 (ICD-10-CM) - Arthralgia of left knee  M17.11 (ICD-10-CM) - Primary osteoarthritis of right knee  M76.31 (ICD-10-CM) - It band syndrome, right  M25.551 (ICD-10-CM) - Greater 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   Rationale for Evaluation and Treatment: Rehabilitation  THERAPY DIAG: Muscle weakness (generalized)  Pain in right hip  Chronic pain of left knee  ONSET DATE: Chronic, aggravated over Christmas (December 2024)  FOLLOW-UP APPT SCHEDULED WITH REFERRING PROVIDER: No, initially no but now has appt for July;  INITIAL EVALUATION: SUBJECTIVE:                                                                                                                                                                                         SUBJECTIVE STATEMENT:  Left ankle and foot pain  PERTINENT HISTORY: Pt reports pain in left ankle that seems to have "flared up over Christmas" after pt reported excess walking. Pt reports feeling "pulling and burning at the base of the lateral ankle." She has previously been to PT for pain in her left hip and knee but has now noticed increased pain in the left ankle/foot. Pt expresses difficulty with exercises;  specifically pressure on the toes, stretching calves, and rocking on toes. Pt notes pain is as high as 5/10 NPS  but is constant and "never really goes away," with lowest pain at 1/10 NPS.  PAIN:    Pain Intensity: Present: 1/10, Best: 1/10, Worst: 5/10 Pain location: Lateral foot and ankle Pain Quality: constant and burning  Radiating: Yes  Swelling: No  Popping, catching, locking: No  Numbness/Tingling: No Focal Weakness: Yes Aggravating factors: Touching lateral ankle, moving the ankle, ascending steps when leading with left foot, walking Relieving factors: Stopping activity, resting, brace temporarily relieves some pain 24-hour pain behavior: No night pain when sleeping on back, stretching in AM helps but feels unstable, PM pain depends on activity during the day History of prior back, hip, knee, or ankle injury, pain, surgery, or therapy: Yes; chronic hip, knee, foot pain Dominant hand: right Imaging: No  Typical footwear: Sneakers or rubber boots Red flags: Negative for personal history of cancer, chills/fever, night sweats, nausea, vomiting, unrelenting pain): Negative  PRECAUTIONS: None  WEIGHT BEARING RESTRICTIONS: No  FALLS: Has patient fallen in last 6 months? 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  Recent memory is intact.  Remote memory is intact.  Attention span and concentration are intact.  Expressive speech is intact.  Patient's fund of knowledge is within normal limits for educational level.    Gross Musculoskeletal Assessment Bulk: Normal Tone: Normal No trophic changes noted to foot/ankle. No ecchymosis, erythema, or edema noted. No gross ankle/foot deformity noted  GAIT: Comments: Mild toe out gait on right foot. Antalgic gait noted on left side.  Posture: No noted discrepancies.  AROM AROM (Normal range in degrees) AROM       Hip Right Left  Flexion (125) WNL WNL  Extension (15) WNL WNL  Abduction (40) WNL WNL  Adduction  WNL WNL  Internal Rotation (45) WNL WNL  External Rotation (45) WNL WNL      Knee    Flexion (135) WNL WNL  Extension (0) WNL WNL      Ankle    Dorsiflexion (20) 10 (WB) 10 (WB)  Plantarflexion (50) 40 35  Inversion (35) 30 20  Eversion (15) 7 8  (* = pain; Blank rows = not tested)   LE MMT: MMT (out of 5) Right  Left   Hip flexion 4 4  Hip extension 4 4  Hip abduction 4 4+  Hip adduction 4+ 4  Hip internal rotation 5 5  Hip external rotation 5 5  Knee flexion 4 4  Knee extension 5 5  Ankle dorsiflexion 5 5  Ankle plantarflexion    Ankle inversion 5 5*  Ankle eversion 5 5*  (* = pain; Blank rows = not tested)  Sensation Diminished sensation left S1 and lateral aspect of left foot.  Reflexes Deferred  Palpation Location LEFT  RIGHT           Plantar fascia 1 0  Dorsal  aspect of foot 1 0  Tarsal tunnel 1 0  (Blank rows = not tested) Graded on 0-4 scale (0 = no pain, 1 = pain, 2 = pain with wincing/grimacing/flinching, 3 = pain with withdrawal, 4 = unwilling to allow palpation), (Blank rows = not tested)  Passive Accessory Motion Superior Tibiofibular Joint: WNL Inferior Tibiofibular Joint: WNL Talocrural Joint Distraction: WNL but noted pain with "pulling feeling" with  left foot Talocrural Joint AP: L hypomobile, painful where hand is purchased Talocrural Joint PA: L hypomobile  SPECIAL TESTS Ligamentous Integrity Anterior Drawer (ATF, 10-15 plantarflexion with anterior translation): Negative Talar Tilt (CFL, inversion): Negative   TODAY'S TREATMENT: 08/28/2023   Subjective: Pt notes ongoing bilateral hip pain, but her R hip has been improving. No resting R hip pain upon arrival today. Pain in hips and knees continues to be activity dependent. No specific questions or concerns.    PAIN: No resting pain;   Ther-ex  NuStep L1-5 x 10 minutes for warm-up and BLE strengthening during interval history (5 minutes unbilled); Sidelying isometric clam with manual resistance 5s hold/5s relax x 10 BLE; Hip long axis isometric abduction 5s hold/5s relax x 10 BLE; Hooklying SAQ with manual resistance from therapist 2 x 10 BLE; Hooklying SLR with 4# ankle weights 2 x 10 BLE; Hooklying bridge marches 2 x 10;   Manual Therapy  Sidelying lateral hip and thigh STM with Theraband roller, performed bilaterally; Single knee to chest, FABER, and FADIR stretches x 45s each bilaterally;   Not today:  Heel raises with UE support x 15 (stopped due to pain in hamstring); DF in WB 06/13/23: 10 degrees BLE Ankle pumps 2 x 30s BLE; Seated ankle circumduction 2 x 30s each direction BLE; Seated resisted PF with green tband x 20 BLE; Seated resisted inversion with green tband x 20 BLE; Seated resisted eversion with green tband x 20 BLE; Supine isometric DF 3 x 20s BLE; Supine isometric PF 3 x 20s BLE; Ankle DF stretch 3 x 30s BLE; 6" lateral step downs to 2" Airex pad x 10 BLE; Pball straight knee bridges x 10;   PATIENT EDUCATION:  Education details: HEP and plan of care Person educated: Patient Education method: Explanation, Demonstration, and Tactile cues Education comprehension: verbalized understanding and returned demonstration   HOME EXERCISE PROGRAM:   Access Code: [email or web address] URL: [email or web address] Date: 06/13/2023 Prepared by: Crawford Dock  Exercises - Seated Ankle Eversion with Resistance  - 1 x daily - 7 x weekly - 2 sets - 10 reps - 3s hold - Seated Ankle Inversion with Resistance and Legs Crossed (Mirrored)  - 1 x daily - 7 x weekly - 2 sets - 10 reps - 3s hold - Seated Ankle Plantarflexion with Resistance  - 1 x daily - 7 x weekly - 3    sets - 10 reps - 3s hold - Long Sitting Calf Stretch with Strap  - 1 x daily - 7 x weekly - 3 reps - 30-45s hold - Standing Gastroc Stretch on Step  - 1 x daily - 7 x weekly - 3 sets - 10 reps - Standing Dorsiflexion Self-Mobilization on Step  - 1 x daily - 7 x weekly - 3 sets - 10 reps - Standing Dorsiflexion Self-Mobilization on Step (Mirrored)  - 1 x daily - 7 x weekly - 3 sets - 10 reps - Standing on Blue foam x 30 secs with EC -heel raises on Blue foam 20 reps   ASSESSMENT:  CLINICAL IMPRESSION: Session  focused on strengthening and manual techniques for bilateral hip pain. Progressed isometrics for bilateral hip ER and abduction. No HEP modifications today. Pt reporting improving R hip pain. Pt encouraged to follow-up as scheduled. She will benefit from PT services to address deficits in strength and pain in order to return to full function at home and with leisure activities.   OBJECTIVE IMPAIRMENTS: decreased strength and hypomobility.   ACTIVITY LIMITATIONS: stairs and locomotion level  PARTICIPATION LIMITATIONS: occupation and yard work  PERSONAL FACTORS: Age, Behavior pattern, Past/current experiences, Time since onset of injury/illness/exacerbation, and 1-2 comorbidities: type II diabetes and left lumbar radiculopathy  are also affecting patient's functional outcome.   REHAB POTENTIAL: Good  CLINICAL DECISION MAKING: Evolving/moderate complexity  EVALUATION COMPLEXITY: Moderate   GOALS: Goals reviewed with patient? Yes  SHORT TERM GOALS: Target date:  06/29/23  Pt will be independent with HEP to improve strength and decrease ankle pain to improve pain-free function at home and work. Baseline:  Goal status: INITIAL   LONG TERM GOALS: Target date: 08/03/23  1.  Pt will decrease worst ankle pain by at least 3 points on the NPRS in order to demonstrate clinically significant reduction in ankle pain. Baseline: 06/08/23: 1/10 best, 5/10 worst; 08/14/23: worst: 1/10; Goal status: ACHIEVED  2.  Pt will increase FADI score by at least 4 points in order demonstrate clinically significant reduction in ankle pain/disability.       Baseline: 06/13/23: 65%; 08/14/23: 80 / 104 or 77% Goal status: ACHIEVED  3.  Pt will improve strength of ankle inversion/eversion with limited to no pain in order to demonstrate improvement in strength and function.  Baseline: 06/08/23: 5/5 with pain; 08/14/23: 5/5 without pain Goal status: ACHIEVED  4.  Pt will report at least 50% improvement in L ankle and knee symptoms in order to demonstrate improvement in her ability to work in the yard/garden with less pain.  Baseline: 08/14/23: 75% improvement Goal status: ACHIEVED  5.  Pt will decrease HAGOS score by at least 20 points in order demonstrate clinically significant reduction in hip pain/disability.  Baseline: 08/14/23: HAGOS Score: 37.7% (Symptoms Score: 50%; Pain Score: 37.5%; Physical Function, Daily Living Score: 45%; Function, Sports, and Recreational Activities Score: 43.7%; Participation in Physical Activities Score: 25%; Quality of Life Score: 25 %) Goal status: INITIAL  6.  Pt will decrease worst posterior R hip pain by at least 3 points on the NPRS in order to demonstrate clinically significant reduction in hip pain. Baseline: 08/14/23: worst: 8/10; Goal status: INITIAL   PLAN: PT FREQUENCY: 1-2x/week  PT DURATION: 12 weeks  PLANNED INTERVENTIONS: Therapeutic exercises, Therapeutic activity, Neuromuscular re-education, Balance training, Gait training,  Patient/Family education, Self Care, Joint mobilization, Joint manipulation, Vestibular training, Canalith repositioning, Orthotic/Fit training, DME instructions, Dry Needling, Electrical stimulation, Spinal manipulation, Spinal mobilization, Cryotherapy, Moist heat, Taping, Traction, Ultrasound, Ionotophoresis 4mg /ml Dexamethasone, Manual therapy, and Re-evaluation.  PLAN FOR NEXT SESSION: strengthening and mobility exercises focused on R hip pain, review and modify HEP as necessary  Taneika Choi D Ashantae Pangallo PT, DPT, GCS  10:14 AM,08/28/23

## 2023-09-01 ENCOUNTER — Encounter: Payer: Self-pay | Admitting: Internal Medicine

## 2023-09-01 NOTE — Therapy (Signed)
 OUTPATIENT PHYSICAL THERAPY ANKLE/KNEE/HIP TREATMENT/RECERTIFICATION  Patient Name: Brandy [name] MRN: [id number] DOB:Aug 04, 1955, 68 y.o., female Today's Date: 09/04/2023  END OF SESSION:  PT End of Session - 09/04/23 1023     Visit Number 13    Number of Visits 49    Date for PT Re-Evaluation 11/27/23    Authorization Type eval: 06/08/23    PT Start Time 1015    PT Stop Time 1100    PT Time Calculation (min) 45 min    Activity Tolerance Patient tolerated treatment well    Behavior During Therapy [facility] for tasks assessed/performed            Past Medical History:  Diagnosis Date   Acute lower GI bleeding 01/19/2020   Arthritis    Atrial fibrillation with rapid ventricular response (HCC)    Chicken pox    Chronic systolic CHF (congestive heart failure) (HCC)    a. 03/2016 Echo: Ef 15-20%, diff HK, ant AK;  b. 05/2016 Echo: Ef 30-35%, diff HK, mildly dil LA/RA.   Diabetes mellitus without complication (HCC)    Dysrhythmia    GERD (gastroesophageal reflux disease)    Heart murmur    Hip discomfort    been going on for 40 years    History of kidney stones    Hyperlipidemia associated with type 2 diabetes mellitus (HCC) 05/12/2021   Hypertension    Moderate mitral regurgitation    a. 03/2016 Echo: mod MR in setting of LV dysfxn.   Motion sickness    car - back seat   NICM (nonischemic cardiomyopathy) (HCC)    a. 03/2016 Echo: EF 15-20%, diff HK, ant AK, mod MR, mod dil LA, mildly dil RA;  b. 04/2016 Cath: nl cors;  c. 05/2016 Echo: EF 30-35%, diff HK.   Obesity    Obstructive sleep apnea    compliant with CPAP   Persistent atrial fibrillation (HCC)    a. Dx 03/2016;  b. CHA2DS2VASc = 2-->Eliquis  5mg  BID;  b. 05/2016 Failed DCCV x 4.   Sleep apnea    Stomach irritation    Visit for monitoring Tikosyn  therapy 07/24/2016   Past Surgical History:  Procedure Laterality Date   ATRIAL FIBRILLATION ABLATION N/A 12/09/2020   Procedure: ATRIAL FIBRILLATION ABLATION;  Surgeon: Boyce Byes, MD;  Location: [facility];  Service: Cardiovascular;  Laterality: N/A;   BIOPSY N/A 01/12/2020   Procedure: BIOPSY;  Surgeon: Irby Mannan, MD;  Location: [facility];  Service: Endoscopy;  Laterality: N/A;   CARDIAC CATHETERIZATION N/A 04/17/2016   Procedure: Left Heart Cath and Coronary Angiography;  Surgeon: Wenona Hamilton, MD;  Location: [facility] INV[facility]IVE CV [facility];  Service: Cardiovascular;  Laterality: N/A;   CARDIOVERSION N/A 10/22/2020   Procedure: CARDIOVERSION;  Surgeon: Sammy Crisp, MD;  Location: [facility] ORS;  Service: Cardiovascular;  Laterality: N/A;   CHOLECYSTECTOMY     COLON SURGERY  2021   COLONOSCOPY N/A 01/20/2020   Procedure: COLONOSCOPY;  Surgeon: Shane Darling, MD;  Location: [facility] ENDOS[facility];  Service: Endoscopy;  Laterality: N/A;   COLONOSCOPY WITH PROPOFOL  N/A 01/12/2020   Procedure: COLONOSCOPY WITH PROPOFOL ;  Surgeon: Irby Mannan, MD;  [facility];  Service: Endoscopy;  Laterality: N/A;  Diabetic - oral meds sleep apnea   ELECTROPHYSIOLOGIC STUDY N/A 05/26/2016   Procedure: Cardioversion;  Surgeon: Wenona Hamilton, MD;  Location: [facility] ORS;  Service: Cardiovascular;  Laterality: N/A;   ESOPHAGOGASTRODUODENOSCOPY (EGD) WITH PROPOFOL  N/A 01/12/2020   Procedure:  ESOPHAGOGASTRODUODENOSCOPY (EGD) WITH PROPOFOL ;  Surgeon: Irby Mannan, MD;  Location: [facility];  Service: Endoscopy;  Laterality: N/A;   POLYPECTOMY N/A 01/12/2020   Procedure: POLYPECTOMY;  Surgeon: Irby Mannan, MD;  Location: [facility];  Service: Endoscopy;  Laterality: N/A;   Patient Active Problem List   Diagnosis Date Noted   Mucous cyst of digit of left hand 05/18/2023   Peroneal tendinitis, left 10/13/2022   Left lumbar radiculopathy 07/26/2022   Arthralgia of left knee 07/26/2022   Pes anserinus bursitis of right knee 04/19/2022   Primary osteoarthritis of right knee 03/27/2022   Greater trochanteric pain  syndrome of right lower extremity 03/27/2022   It band syndrome, right 03/27/2022   Hyperlipidemia associated with type 2 diabetes mellitus (HCC) 05/12/2021   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   Varicose veins of leg with pain, bilateral 07/06/2020   Type II diabetes mellitus with complication (HCC) 01/19/2020   Gastric polyp    Colon polyps    Varicose veins of both lower extremities 10/20/2019   Dysphagia 10/20/2019   Osteoarthritis of right hand 10/31/2016   Nonischemic cardiomyopathy (HCC) 04/14/2016   BMI 33.0-33.9,adult 04/14/2016    PCP: Janna Melter, MD  REFERRING PROVIDER: Rebekah Canada, MD  REFERRING DIAG:  [phone] (ICD-10-CM) - Peroneal tendinitis, left  M54.16 (ICD-10-CM) - Left lumbar radiculopathy  M25.562 (ICD-10-CM) - Arthralgia of left knee  M17.11 (ICD-10-CM) - Primary osteoarthritis of right knee  M76.31 (ICD-10-CM) - It band syndrome, right  M25.551 (ICD-10-CM) - Greater 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   Rationale for Evaluation and Treatment: Rehabilitation  THERAPY DIAG: Muscle weakness (generalized)  Pain in right hip  Chronic pain of left knee  ONSET DATE: Chronic, aggravated over Christmas (December 2024)  FOLLOW-UP APPT SCHEDULED WITH REFERRING PROVIDER: No, initially no but now has appt for July;  INITIAL EVALUATION: SUBJECTIVE:                                                                                                                                                                                         SUBJECTIVE STATEMENT:  Left ankle and foot pain  PERTINENT HISTORY: Pt reports pain in left ankle that seems to have "flared up over Christmas" after pt reported excess walking. Pt reports feeling "pulling and burning at the base of the lateral ankle." She has previously been to PT for pain in her left hip and knee but has now noticed increased pain in the left ankle/foot. Pt expresses difficulty with  exercises; specifically pressure on the toes, stretching calves, and rocking on toes. Pt notes pain is as high as 5/10 NPS  but is constant and "never really goes away," with lowest pain at 1/10 NPS.  PAIN:    Pain Intensity: Present: 1/10, Best: 1/10, Worst: 5/10 Pain location: Lateral foot and ankle Pain Quality: constant and burning  Radiating: Yes  Swelling: No  Popping, catching, locking: No  Numbness/Tingling: No Focal Weakness: Yes Aggravating factors: Touching lateral ankle, moving the ankle, ascending steps when leading with left foot, walking Relieving factors: Stopping activity, resting, brace temporarily relieves some pain 24-hour pain behavior: No night pain when sleeping on back, stretching in AM helps but feels unstable, PM pain depends on activity during the day History of prior back, hip, knee, or ankle injury, pain, surgery, or therapy: Yes; chronic hip, knee, foot pain Dominant hand: right Imaging: No  Typical footwear: Sneakers or rubber boots Red flags: Negative for personal history of cancer, chills/fever, night sweats, nausea, vomiting, unrelenting pain): Negative  PRECAUTIONS: None  WEIGHT BEARING RESTRICTIONS: No  FALLS: Has patient fallen in last 6 months? 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  Recent memory is intact.  Remote memory is intact.  Attention span and concentration are intact.  Expressive speech is intact.  Patient's fund of knowledge is within normal limits for educational level.    Gross Musculoskeletal Assessment Bulk: Normal Tone: Normal No trophic changes noted to foot/ankle. No ecchymosis, erythema, or edema noted. No gross ankle/foot deformity noted  GAIT: Comments: Mild toe out gait on right foot. Antalgic gait noted on left side.  Posture: No noted discrepancies.  AROM AROM (Normal range in degrees) AROM       Hip Right Left  Flexion (125) WNL WNL  Extension (15) WNL WNL  Abduction (40) WNL WNL  Adduction  WNL WNL  Internal Rotation (45) WNL WNL  External Rotation (45) WNL WNL      Knee    Flexion (135) WNL WNL  Extension (0) WNL WNL      Ankle    Dorsiflexion (20) 10 (WB) 10 (WB)  Plantarflexion (50) 40 35  Inversion (35) 30 20  Eversion (15) 7 8  (* = pain; Blank rows = not tested)   LE MMT: MMT (out of 5) Right  Left   Hip flexion 4 4  Hip extension 4 4  Hip abduction 4 4+  Hip adduction 4+ 4  Hip internal rotation 5 5  Hip external rotation 5 5  Knee flexion 4 4  Knee extension 5 5  Ankle dorsiflexion 5 5  Ankle plantarflexion    Ankle inversion 5 5*  Ankle eversion 5 5*  (* = pain; Blank rows = not tested)  Sensation Diminished sensation left S1 and lateral aspect of left foot.  Reflexes Deferred  Palpation Location LEFT  RIGHT           Plantar fascia 1 0  Dorsal  aspect of foot 1 0  Tarsal tunnel 1 0  (Blank rows = not tested) Graded on 0-4 scale (0 = no pain, 1 = pain, 2 = pain with wincing/grimacing/flinching, 3 = pain with withdrawal, 4 = unwilling to allow palpation), (Blank rows = not tested)  Passive Accessory Motion Superior Tibiofibular Joint: WNL Inferior Tibiofibular Joint: WNL Talocrural Joint Distraction: WNL but noted pain  with "pulling feeling" with left foot Talocrural Joint AP: L hypomobile, painful where hand is purchased Talocrural Joint PA: L hypomobile  SPECIAL TESTS Ligamentous Integrity Anterior Drawer (ATF, 10-15 plantarflexion with anterior translation): Negative Talar Tilt (CFL, inversion): Negative   TODAY'S TREATMENT: 09/04/2023   Subjective: Pt reports some recent worsening of L foot pain. It started worsening again on Friday so she started using her L ankle brace again which has helped considerably. No resting R hip pain upon arrival today. Pain in hips and knees continues to be activity dependent. No specific questions or concerns.    PAIN: No resting pain;   Ther-ex  NuStep L1-5 x 10 minutes for warm-up and BLE strengthening during interval history (5 minutes unbilled); Recertification but no need to update goals; Prone L knee hamstring curls with manual resistance from therapist 2 x 10; Prone LLE straight knee hip extension 2 x 10; Prone L hip ER with knee at 90 flexion and manual resistance from therapist 2 x 10; Supine L quad set with towel behind knee 3s hold 2 x 10; Hooklying SAQ with manual resistance from therapist 2 x 10 BLE; Hooklying SLR with 4# ankle weights 2 x 10 BLE; Hooklying bridges 2 x 10; Supine L ankle isometric eversion 3s hold 2 x 10;   Manual Therapy  STM to L peroneus brevis and longus; Mild cavitation with prone cuboid manipulation;   Not today:  Heel raises with UE support x 15 (stopped due to pain in hamstring); DF in WB 06/13/23: 10 degrees BLE Ankle pumps 2 x 30s BLE; Seated ankle circumduction 2 x 30s each direction BLE; Seated resisted PF with green tband x 20 BLE; Seated resisted inversion with green tband x 20 BLE; Seated resisted eversion with green tband x 20 BLE; Supine isometric DF 3 x 20s BLE; Supine isometric PF 3 x 20s BLE; Ankle DF stretch 3 x 30s BLE; 6" lateral step downs to 2" Airex pad x 10 BLE; Pball straight knee bridges x  10;   PATIENT EDUCATION:  Education details: HEP and plan of care Person educated: Patient Education method: Explanation, Demonstration, and Tactile cues Education comprehension: verbalized understanding and returned demonstration   HOME EXERCISE PROGRAM:  Access Code: [email or web address] URL: [email or web address] Date: 06/13/2023 Prepared by: Crawford Dock  Exercises - Seated Ankle Eversion with Resistance  - 1 x daily - 7 x weekly - 2 sets - 10 reps - 3s hold - Seated Ankle Inversion with Resistance and Legs Crossed (Mirrored)  - 1 x daily - 7 x weekly - 2 sets - 10 reps - 3s hold - Seated Ankle Plantarflexion with Resistance  - 1 x daily - 7 x weekly - 3    sets - 10 reps - 3s hold - Long Sitting Calf Stretch with Strap  - 1 x daily - 7 x weekly - 3 reps - 30-45s hold - Standing Gastroc Stretch on Step  - 1 x daily - 7 x weekly - 3 sets - 10 reps - Standing Dorsiflexion Self-Mobilization on Step  - 1 x daily - 7 x weekly - 3  sets - 10 reps - Standing Dorsiflexion Self-Mobilization on Step (Mirrored)  - 1 x daily - 7 x weekly - 3 sets - 10 reps - Standing on Blue foam x 30 secs with EC -heel raises on Blue foam 20 reps   ASSESSMENT:  CLINICAL IMPRESSION: Updated outcome measures with patient during visit on 08/14/23. At that time she reported approximately 75% improvement in ankle symptoms since starting with therapy. Her FADI improved from 65% at the initial evaluation to 77%. She demonstrated 5/5 L ankle inversion and eversion strength without pain. Unfortunately over the course of the last few days she has had a flare in her foot pain. This has been helped considerably by using her ankle brace. She reports ongoing bilateral hip pain, worse on the R side compared to the L. She completed the HAGOS questionnaire and scored 37.7% (Symptoms Score: 50%; Pain Score: 37.5%; Physical Function, Daily Living Score: 45%; Function, Sports, and Recreational Activities Score: 43.7%;  Participation in Physical Activities Score: 25%; Quality of Life Score: 25 %). Recent focus has been on hip strengthening. She will benefit from PT services to address deficits in strength and pain in order to return to full function at home and with leisure activities.   OBJECTIVE IMPAIRMENTS: decreased strength and hypomobility.   ACTIVITY LIMITATIONS: stairs and locomotion level  PARTICIPATION LIMITATIONS: occupation and yard work  PERSONAL FACTORS: Age, Behavior pattern, Past/current experiences, Time since onset of injury/illness/exacerbation, and 1-2 comorbidities: type II diabetes and left lumbar radiculopathy  are also affecting patient's functional outcome.   REHAB POTENTIAL: Good  CLINICAL DECISION MAKING: Evolving/moderate complexity  EVALUATION COMPLEXITY: Moderate   GOALS: Goals reviewed with patient? Yes  SHORT TERM GOALS: Target date:   Pt will be independent with HEP to improve strength and decrease ankle pain to improve pain-free function at home and work. Baseline:  Goal status: ACHIEVED   LONG TERM GOALS: Target date: 11/27/23  1.  Pt will decrease worst ankle pain by at least 3 points on the NPRS in order to demonstrate clinically significant reduction in ankle pain. Baseline: 06/08/23: 1/10 best, 5/10 worst; 08/14/23: worst: 1/10; Goal status: ACHIEVED  2.  Pt will increase FADI score by at least 4 points in order demonstrate clinically significant reduction in ankle pain/disability.       Baseline: 06/13/23: 65%; 08/14/23: 80 / 104 or 77% Goal status: ACHIEVED  3.  Pt will improve strength of ankle inversion/eversion with limited to no pain in order to demonstrate improvement in strength and function.  Baseline: 06/08/23: 5/5 with pain; 08/14/23: 5/5 without pain Goal status: ACHIEVED  4.  Pt will report at least 50% improvement in L ankle and knee symptoms in order to demonstrate improvement in her ability to work in the yard/garden with less pain.  Baseline:  08/14/23: 75% improvement Goal status: ACHIEVED  5.  Pt will decrease HAGOS score by at least 20 points in order demonstrate clinically significant reduction in hip pain/disability.  Baseline: 08/14/23: HAGOS Score: 37.7% (Symptoms Score: 50%; Pain Score: 37.5%; Physical Function, Daily Living Score: 45%; Function, Sports, and Recreational Activities Score: 43.7%; Participation in Physical Activities Score: 25%; Quality of Life Score: 25 %) Goal status: INITIAL  6.  Pt will decrease worst posterior R hip pain by at least 3 points on the NPRS in order to demonstrate clinically significant reduction in hip pain. Baseline: 08/14/23: worst: 8/10; Goal status: INITIAL   PLAN: PT FREQUENCY: 1-2x/week  PT DURATION: 12 weeks  PLANNED INTERVENTIONS: Therapeutic exercises,  Therapeutic activity, Neuromuscular re-education, Balance training, Gait training, Patient/Family education, Self Care, Joint mobilization, Joint manipulation, Vestibular training, Canalith repositioning, Orthotic/Fit training, DME instructions, Dry Needling, Electrical stimulation, Spinal manipulation, Spinal mobilization, Cryotherapy, Moist heat, Taping, Traction, Ultrasound, Ionotophoresis 4mg /ml Dexamethasone , Manual therapy, and Re-evaluation.  PLAN FOR NEXT SESSION: strengthening and mobility exercises focused on R hip pain, review and modify HEP as necessary  Arlynn Mcdermid D Uri Covey PT, DPT, GCS  10:07 PM,09/04/23

## 2023-09-04 ENCOUNTER — Ambulatory Visit

## 2023-09-04 DIAGNOSIS — M6281 Muscle weakness (generalized): Secondary | ICD-10-CM

## 2023-09-04 DIAGNOSIS — G8929 Other chronic pain: Secondary | ICD-10-CM | POA: Diagnosis not present

## 2023-09-04 DIAGNOSIS — M25551 Pain in right hip: Secondary | ICD-10-CM | POA: Diagnosis not present

## 2023-09-04 DIAGNOSIS — M25562 Pain in left knee: Secondary | ICD-10-CM | POA: Diagnosis not present

## 2023-09-14 ENCOUNTER — Encounter: Payer: Self-pay | Admitting: Internal Medicine

## 2023-09-14 ENCOUNTER — Ambulatory Visit (INDEPENDENT_AMBULATORY_CARE_PROVIDER_SITE_OTHER): Payer: Self-pay | Admitting: Internal Medicine

## 2023-09-14 VITALS — BP 124/72 | HR 71 | Resp 16 | Ht 67.0 in | Wt 232.0 lb

## 2023-09-14 DIAGNOSIS — I5022 Chronic systolic (congestive) heart failure: Secondary | ICD-10-CM | POA: Insufficient documentation

## 2023-09-14 DIAGNOSIS — E1169 Type 2 diabetes mellitus with other specified complication: Secondary | ICD-10-CM

## 2023-09-14 DIAGNOSIS — E785 Hyperlipidemia, unspecified: Secondary | ICD-10-CM

## 2023-09-14 DIAGNOSIS — Z7984 Long term (current) use of oral hypoglycemic drugs: Secondary | ICD-10-CM | POA: Diagnosis not present

## 2023-09-14 DIAGNOSIS — E118 Type 2 diabetes mellitus with unspecified complications: Secondary | ICD-10-CM

## 2023-09-14 DIAGNOSIS — I428 Other cardiomyopathies: Secondary | ICD-10-CM

## 2023-09-14 LAB — POCT GLYCOSYLATED HEMOGLOBIN (HGB A1C): Hemoglobin A1C: 6.1 % — AB (ref 4.0–5.6)

## 2023-09-14 NOTE — Assessment & Plan Note (Signed)
 Blood sugars have been stable.  No recent hypoglycemic events requiring assistance. Currently medications are metformin  250 mg daily and Farxiga . Lab Results  Component Value Date   HGBA1C 6.1 (A) 09/14/2023   Last visit no changes were made. A1C is slightly higher - increase metformin  to 500 mg once a day. Check BS 2 hr PC Would like her to try [facility] but CGMs are not covered

## 2023-09-14 NOTE — Patient Instructions (Signed)
Nasacort nasal spray.

## 2023-09-14 NOTE — Progress Notes (Signed)
 Date:  09/14/2023   Name:  Brandy [name]   DOB:  1955-11-07   MRN:  [id number]   Chief Complaint: Diabetes and Hyperlipidemia  Diabetes She presents for her follow-up diabetic visit. She has type 2 diabetes mellitus. Her disease course has been stable. Pertinent negatives for hypoglycemia include no dizziness or headaches. Pertinent negatives for diabetes include no chest pain, no fatigue and no weakness.  Hyperlipidemia This is a chronic problem. Recent lipid tests were reviewed and are high. Pertinent negatives include no chest pain, myalgias or shortness of breath. Current antihyperlipidemic treatment includes nicotinic acid.    Review of Systems  Constitutional:  Negative for fatigue and unexpected weight change.  HENT:  Negative for trouble swallowing.   Eyes:  Negative for visual disturbance.  Respiratory:  Negative for cough, chest tightness, shortness of breath and wheezing.   Cardiovascular:  Negative for chest pain, palpitations and leg swelling.  Gastrointestinal:  Negative for abdominal pain, constipation and diarrhea.  Musculoskeletal:  Negative for myalgias. Arthralgias: in both hands - slowly worsening. Neurological:  Negative for dizziness, weakness, light-headedness and headaches.     Lab Results  Component Value Date   NA 141 05/18/2023   K 4.6 05/18/2023   CO2 22 05/18/2023   GLUCOSE 104 (H) 05/18/2023   BUN 16 05/18/2023   CREATININE 0.91 05/18/2023   CALCIUM  9.6 05/18/2023   EGFR 69 05/18/2023   GFRNONAA >60 02/12/2023   Lab Results  Component Value Date   CHOL 191 05/18/2023   HDL 43 05/18/2023   LDLCALC 120 (H) 05/18/2023   LDLDIRECT 131.0 10/31/2016   TRIG 157 (H) 05/18/2023   CHOLHDL 4.4 05/18/2023   Lab Results  Component Value Date   TSH 2.260 05/18/2023   Lab Results  Component Value Date   HGBA1C 6.1 (A) 09/14/2023   Lab Results  Component Value Date   WBC 6.5 05/18/2023   HGB 14.4 05/18/2023   HCT 43.0 05/18/2023   MCV 91  05/18/2023   PLT 276 05/18/2023   Lab Results  Component Value Date   ALT 22 05/18/2023   AST 17 05/18/2023   ALKPHOS 87 05/18/2023   BILITOT 0.4 05/18/2023   Lab Results  Component Value Date   VD25OH 38.71 10/31/2016     Patient Active Problem List   Diagnosis Date Noted   Mucous cyst of digit of left hand 05/18/2023   Peroneal tendinitis, left 10/13/2022   Left lumbar radiculopathy 07/26/2022   Arthralgia of left knee 07/26/2022   Pes anserinus bursitis of right knee 04/19/2022   Primary osteoarthritis of right knee 03/27/2022   Greater 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 03/27/2022   It band syndrome, right 03/27/2022   Hyperlipidemia associated with type 2 diabetes mellitus (HCC) 05/12/2021   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   Varicose veins of leg with pain, bilateral 07/06/2020   Type II diabetes mellitus with complication (HCC) 01/19/2020   Gastric polyp    Colon polyps    Varicose veins of both lower extremities 10/20/2019   Dysphagia 10/20/2019   Osteoarthritis of right hand 10/31/2016   Nonischemic cardiomyopathy (HCC) 04/14/2016   BMI 33.0-33.9,adult 04/14/2016    No Known Allergies  Past Surgical History:  Procedure Laterality Date   ATRIAL FIBRILLATION ABLATION N/A 12/09/2020   Procedure: ATRIAL FIBRILLATION ABLATION;  Surgeon: Boyce Byes, MD;  Location: [facility];  Service: Cardiovascular;  Laterality: N/A;   BIOPSY N/A 01/12/2020   Procedure: BIOPSY;  Surgeon: Irby Mannan, MD;  Location: [facility];  Service: Endoscopy;  Laterality: N/A;   CARDIAC CATHETERIZATION N/A 04/17/2016   Procedure: Left Heart Cath and Coronary Angiography;  Surgeon: Wenona Hamilton, MD;  Location: [facility];  Service: Cardiovascular;  Laterality: N/A;   CARDIOVERSION N/A 10/22/2020   Procedure: CARDIOVERSION;  Surgeon: Sammy Crisp, MD;  Location: [facility];  Service: Cardiovascular;  Laterality:  N/A;   CHOLECYSTECTOMY     COLON SURGERY  2021   COLONOSCOPY N/A 01/20/2020   Procedure: COLONOSCOPY;  Surgeon: Shane Darling, MD;  Location: [facility];  Service: Endoscopy;  Laterality: N/A;   COLONOSCOPY WITH PROPOFOL  N/A 01/12/2020   Procedure: COLONOSCOPY WITH PROPOFOL ;  Surgeon: Irby Mannan, MD;  Location: [facility];  Service: Endoscopy;  Laterality: N/A;  Diabetic - oral meds sleep apnea   ELECTROPHYSIOLOGIC STUDY N/A 05/26/2016   Procedure: Cardioversion;  Surgeon: Wenona Hamilton, MD;  Location: [facility];  Service: Cardiovascular;  Laterality: N/A;   ESOPHAGOGASTRODUODENOSCOPY (EGD) WITH PROPOFOL  N/A 01/12/2020   Procedure: ESOPHAGOGASTRODUODENOSCOPY (EGD) WITH PROPOFOL ;  Surgeon: Irby Mannan, MD;  Location: [facility];  Service: Endoscopy;  Laterality: N/A;   POLYPECTOMY N/A 01/12/2020   Procedure: POLYPECTOMY;  Surgeon: Irby Mannan, MD;  Location: [facility];  Service: Endoscopy;  Laterality: N/A;    Social History   Tobacco Use   Smoking status: Never    Passive exposure: Never   Smokeless tobacco: Never  Vaping Use   Vaping status: Never Used  Substance Use Topics   Alcohol use: Never   Drug use: Never     Medication list has been reviewed and updated.  Current Meds  Medication Sig   CALCIUM  PO Take by mouth daily.   Celery Seed OIL Using as needed to help with constipation   Cholecalciferol  (VITAMIN D3) 1000 units CAPS Take 1,000 Units by mouth 2 (two) times daily.   dapagliflozin  propanediol (FARXIGA ) 10 MG TABS tablet Take 1 tablet (10 mg total) by mouth daily before breakfast.   furosemide  (LASIX ) 20 MG tablet Take 1 tablet (20 mg total) by mouth daily as needed for fluid or edema.   glucose blood (ONETOUCH VERIO) test strip USE 1 STRIP TO CHECK GLUCOSE UP TO 4 TIMES DAILY AS DIRECTED   Lancets (ONETOUCH DELICA PLUS LANCET33G) MISC USE  1 TO CHECK GLUCOSE UP TO 4 TIMES DAILY AS DIRECTED   Lavender  Oil OIL Apply 1 application topically daily as needed (stress).   Lemon Oil OIL Take 1 application by mouth daily as needed (add to water ).   Lemongrass Oil OIL Apply 1 application topically daily as needed (multiple uses).   Magnesium Bisglycinate (MAG GLYCINATE) 100 MG TABS Take 270 mg by mouth in the morning, at noon, and at bedtime.   metFORMIN  (GLUCOPHAGE -XR) 500 MG 24 hr tablet Take 1/2 (one-half) tablet by mouth twice daily   metoprolol  tartrate (LOPRESSOR ) 50 MG tablet Take 1/2 (one-half) tablet by mouth twice daily   Multiple Vitamins-Minerals (MULTIVITAMIN WITH MINERALS) tablet Take 1 tablet by mouth daily.   Oil Base OIL Breathe oil as needed for congestion   OIL OF OREGANO PO Uses as needed   peppermint oil liquid Apply 1 application topically daily as needed (itchy/ cold symptoms).   Tea Tree Oil OIL Apply 1 application topically daily as needed (fungus).   Turmeric 500 MG CAPS Take by mouth.   vitamin B-12 (CYANOCOBALAMIN) 500 MCG tablet Take 500 mcg by  mouth daily.   vitamin C (ASCORBIC ACID) 500 MG tablet Take 500 mg by mouth daily.    vitamin E 45 MG (100 UNITS) capsule Take by mouth daily.       09/14/2023    9:50 AM 05/18/2023    9:41 AM 12/20/2022    7:58 AM 09/06/2022   10:35 AM  GAD 7 : Generalized Anxiety Score  Nervous, Anxious, on Edge 1 1 1  0  Control/stop worrying 0 0 1 0  Worry too much - different things 0 0 1 0  Trouble relaxing 0 0 1 2  Restless 0 0 1 1  Easily annoyed or irritable 0 0 1 0  Afraid - awful might happen 0 0 1 0  Total GAD 7 Score 1 1 7 3   Anxiety Difficulty Not difficult at all Not difficult at all Somewhat difficult Not difficult at all       09/14/2023    9:50 AM 05/18/2023    9:41 AM 12/20/2022    7:58 AM  Depression screen PHQ 2/9  Decreased Interest 0 0 1  Down, Depressed, Hopeless 0 0 1  PHQ - 2 Score 0 0 2  Altered sleeping  0 2  Tired, decreased energy  0 1  Change in appetite  0 2  Feeling bad or failure about yourself   0 0   Trouble concentrating  0 1  Moving slowly or fidgety/restless  0 0  Suicidal thoughts  0 0  PHQ-9 Score  0 8  Difficult doing work/chores  Not difficult at all Not difficult at all    BP Readings from Last 3 Encounters:  09/14/23 124/72  05/21/23 (!) 140/78  05/18/23 118/70    Physical Exam Vitals and nursing note reviewed.  Constitutional:      General: She is not in acute distress.    Appearance: Normal appearance. She is well-developed.  HENT:     Head: Normocephalic and atraumatic.  Cardiovascular:     Rate and Rhythm: Normal rate and regular rhythm.  Pulmonary:     Effort: Pulmonary effort is normal. No respiratory distress.     Breath sounds: No wheezing or rhonchi.  Musculoskeletal:     Cervical back: Normal range of motion.     Right lower leg: No edema.     Left lower leg: No edema.     Comments: OA changes to both hands Mucus cyst left middle finger unchanged  Lymphadenopathy:     Cervical: No cervical adenopathy.  Skin:    General: Skin is warm and dry.     Findings: No rash.  Neurological:     Mental Status: She is alert and oriented to person, place, and time.  Psychiatric:        Mood and Affect: Mood normal.        Behavior: Behavior normal.     Wt Readings from Last 3 Encounters:  09/14/23 232 lb (105.2 kg)  05/21/23 230 lb (104.3 kg)  05/18/23 226 lb (102.5 kg)    BP 124/72   Pulse 71   Resp 16   Ht 5\' 7"  (1.702 m)   Wt 232 lb (105.2 kg)   SpO2 94%   BMI 36.34 kg/m   Assessment and Plan:  Problem List Items Addressed This Visit       Unprioritized   Nonischemic cardiomyopathy (HCC) (Chronic)   Stable on metoprolol  with pacemaker in place.      Type II diabetes mellitus with complication (HCC) -  Primary (Chronic)   Blood sugars have been stable.  No recent hypoglycemic events requiring assistance. Currently medications are metformin  250 mg daily and Farxiga . Lab Results  Component Value Date   HGBA1C 6.1 (A) 09/14/2023    Last visit no changes were made. A1C is slightly higher - increase metformin  to 500 mg once a day. Check BS 2 hr PC Would like her to try [facility] but CGMs are not covered        Relevant Orders   POCT glycosylated hemoglobin (Hb A1C) (Completed)   Hyperlipidemia associated with type 2 diabetes mellitus (HCC) (Chronic)   She does not want to try statins - has otc Niacin that she has taken only recently and not regularly. Recommend daily dosing for benefit. Consider checking lipids next visit.      Other Visit Diagnoses       Long term current use of oral hypoglycemic drug           Return in about 4 months (around 01/15/2024) for DM.    Sheron Dixons, MD [facility] [place]

## 2023-09-14 NOTE — Assessment & Plan Note (Signed)
 Stable on metoprolol  with pacemaker in place.

## 2023-09-14 NOTE — Assessment & Plan Note (Signed)
 She does not want to try statins - has otc Niacin that she has taken only recently and not regularly. Recommend daily dosing for benefit. Consider checking lipids next visit.

## 2023-09-17 ENCOUNTER — Ambulatory Visit (INDEPENDENT_AMBULATORY_CARE_PROVIDER_SITE_OTHER): Payer: Medicare Other

## 2023-09-17 ENCOUNTER — Encounter: Payer: Self-pay | Admitting: Internal Medicine

## 2023-09-17 DIAGNOSIS — I4819 Other persistent atrial fibrillation: Secondary | ICD-10-CM

## 2023-09-17 LAB — CUP PACEART REMOTE DEVICE CHECK
Date Time Interrogation Session: 20250504231846
Implantable Pulse Generator Implant Date: 20230928

## 2023-09-17 NOTE — Telephone Encounter (Signed)
 Please review.  KP

## 2023-09-17 NOTE — Therapy (Signed)
 OUTPATIENT PHYSICAL THERAPY ANKLE/KNEE/HIP TREATMENT  Patient Name: Brandy [name] MRN: [id number] DOB:[birth year], 68 y.o., female Today's Date: 09/18/2023  END OF SESSION:  PT End of Session - 09/18/23 0856     Visit Number 14    Number of Visits 49    Date for PT Re-Evaluation 11/27/23    Authorization Type eval: 06/08/23    PT Start Time 0845    PT Stop Time 0930    PT Time Calculation (min) 45 min    Activity Tolerance Patient tolerated treatment well    Behavior During Therapy [facility] for tasks assessed/performed            Past Medical History:  Diagnosis Date   Acute lower GI bleeding 01/19/2020   Arthritis    Atrial fibrillation with rapid ventricular response (HCC)    Chicken pox    Chronic systolic CHF (congestive heart failure) (HCC)    a. 03/2016 Echo: Ef 15-20%, diff HK, ant AK;  b. 05/2016 Echo: Ef 30-35%, diff HK, mildly dil LA/RA.   Diabetes mellitus without complication (HCC)    Dysrhythmia    GERD (gastroesophageal reflux disease)    Heart murmur    Hip discomfort    been going on for 40 years    History of kidney stones    Hyperlipidemia associated with type 2 diabetes mellitus (HCC) 05/12/2021   Hypertension    Moderate mitral regurgitation    a. 03/2016 Echo: mod MR in setting of LV dysfxn.   Motion sickness    car - back seat   NICM (nonischemic cardiomyopathy) (HCC)    a. 03/2016 Echo: EF 15-20%, diff HK, ant AK, mod MR, mod dil LA, mildly dil RA;  b. 04/2016 Cath: nl cors;  c. 05/2016 Echo: EF 30-35%, diff HK.   Obesity    Obstructive sleep apnea    compliant with CPAP   Persistent atrial fibrillation (HCC)    a. Dx 03/2016;  b. CHA2DS2VASc = 2-->Eliquis  5mg  BID;  b. 05/2016 Failed DCCV x 4.   Sleep apnea    Stomach irritation    Visit for monitoring Tikosyn  therapy 07/24/2016   Past Surgical History:  Procedure Laterality Date   ATRIAL FIBRILLATION ABLATION N/A 12/09/2020   Procedure: ATRIAL FIBRILLATION ABLATION;  Surgeon: Boyce Byes, MD;   Location: [facility] INVASIVE CV [facility];  Service: Cardiovascular;  Laterality: N/A;   BIOPSY N/A 01/12/2020   Procedure: BIOPSY;  Surgeon: Irby Mannan, MD;  Location: [facility];  Service: Endoscopy;  Laterality: N/A;   CARDIAC CATHETERIZATION N/A 04/17/2016   Procedure: Left Heart Cath and Coronary Angiography;  Surgeon: Wenona Hamilton, MD;  Location: [facility] INVASIVE CV LAB;  Service: Cardiovascular;  Laterality: N/A;   CARDIOVERSION N/A 10/22/2020   Procedure: CARDIOVERSION;  Surgeon: Sammy Crisp, MD;  Location: [facility] ORS;  Service: Cardiovascular;  Laterality: N/A;   CHOLECYSTECTOMY     COLON SURGERY  2021   COLONOSCOPY N/A 01/20/2020   Procedure: COLONOSCOPY;  Surgeon: Shane Darling, MD;  Location: [facility] ENDOSCOPY;  Service: Endoscopy;  Laterality: N/A;   COLONOSCOPY WITH PROPOFOL  N/A 01/12/2020   Procedure: COLONOSCOPY WITH PROPOFOL ;  Surgeon: Irby Mannan, MD;  Location: [facility];  Service: Endoscopy;  Laterality: N/A;  Diabetic - oral meds sleep apnea   ELECTROPHYSIOLOGIC STUDY N/A 05/26/2016   Procedure: Cardioversion;  Surgeon: Wenona Hamilton, MD;  Location: [facility] ORS;  Service: Cardiovascular;  Laterality: N/A;   ESOPHAGOGASTRODUODENOSCOPY (EGD) WITH PROPOFOL  N/A 01/12/2020   Procedure:  ESOPHAGOGASTRODUODENOSCOPY (EGD) WITH PROPOFOL ;  Surgeon: Irby Mannan, MD;  Location: [facility];  Service: Endoscopy;  Laterality: N/A;   POLYPECTOMY N/A 01/12/2020   Procedure: POLYPECTOMY;  Surgeon: Irby Mannan, MD;  Location: [facility];  Service: Endoscopy;  Laterality: N/A;   Patient Active Problem List   Diagnosis Date Noted   Mucous cyst of digit of left hand 05/18/2023   Peroneal tendinitis, left 10/13/2022   Left lumbar radiculopathy 07/26/2022   Arthralgia of left knee 07/26/2022   Pes anserinus bursitis of right knee 04/19/2022   Primary osteoarthritis of right knee 03/27/2022   Greater trochanteric pain syndrome of  right lower extremity 03/27/2022   It band syndrome, right 03/27/2022   Hyperlipidemia associated with type 2 diabetes mellitus (HCC) 05/12/2021   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   Varicose veins of leg with pain, bilateral 07/06/2020   Type II diabetes mellitus with complication (HCC) 01/19/2020   Gastric polyp    Colon polyps    Varicose veins of both lower extremities 10/20/2019   Dysphagia 10/20/2019   Osteoarthritis of right hand 10/31/2016   Nonischemic cardiomyopathy (HCC) 04/14/2016   BMI 33.0-33.9,adult 04/14/2016    PCP: Janna Melter, MD  REFERRING PROVIDER: Rebekah Canada, MD  REFERRING DIAG:  [phone] (ICD-10-CM) - Peroneal tendinitis, left  M54.16 (ICD-10-CM) - Left lumbar radiculopathy  M25.562 (ICD-10-CM) - Arthralgia of left knee  M17.11 (ICD-10-CM) - Primary osteoarthritis of right knee  M76.31 (ICD-10-CM) - It band syndrome, right  M25.551 (ICD-10-CM) - Greater 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   Rationale for Evaluation and Treatment: Rehabilitation  THERAPY DIAG: Muscle weakness (generalized)  Pain in right hip  ONSET DATE: Chronic, aggravated over Christmas (December 2024)  FOLLOW-UP APPT SCHEDULED WITH REFERRING PROVIDER: No, initially no but now has appt for July;  INITIAL EVALUATION: SUBJECTIVE:                                                                                                                                                                                         SUBJECTIVE STATEMENT:  Left ankle and foot pain  PERTINENT HISTORY: Pt reports pain in left ankle that seems to have "flared up over Christmas" after pt reported excess walking. Pt reports feeling "pulling and burning at the base of the lateral ankle." She has previously been to PT for pain in her left hip and knee but has now noticed increased pain in the left ankle/foot. Pt expresses difficulty with exercises; specifically pressure on the  toes, stretching calves, and rocking on toes. Pt notes pain is as high as 5/10 NPS but is constant and "never really  goes away," with lowest pain at 1/10 NPS.  PAIN:    Pain Intensity: Present: 1/10, Best: 1/10, Worst: 5/10 Pain location: Lateral foot and ankle Pain Quality: constant and burning  Radiating: Yes  Swelling: No  Popping, catching, locking: No  Numbness/Tingling: No Focal Weakness: Yes Aggravating factors: Touching lateral ankle, moving the ankle, ascending steps when leading with left foot, walking Relieving factors: Stopping activity, resting, brace temporarily relieves some pain 24-hour pain behavior: No night pain when sleeping on back, stretching in AM helps but feels unstable, PM pain depends on activity during the day History of prior back, hip, knee, or ankle injury, pain, surgery, or therapy: Yes; chronic hip, knee, foot pain Dominant hand: right Imaging: No  Typical footwear: Sneakers or rubber boots Red flags: Negative for personal history of cancer, chills/fever, night sweats, nausea, vomiting, unrelenting pain): Negative  PRECAUTIONS: None  WEIGHT BEARING RESTRICTIONS: No  FALLS: Has patient fallen in last 6 months? 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  Recent memory is intact.  Remote memory is intact.  Attention span and concentration are intact.  Expressive speech is intact.  Patient's fund of knowledge is within normal limits for educational level.    Gross Musculoskeletal Assessment Bulk: Normal Tone: Normal No trophic changes noted to foot/ankle. No ecchymosis, erythema, or edema noted. No gross ankle/foot deformity noted  GAIT: Comments: Mild toe out gait on right foot. Antalgic gait noted on left side.  Posture: No noted discrepancies.  AROM AROM (Normal range in degrees) AROM       Hip Right Left  Flexion (125) WNL WNL  Extension (15) WNL WNL  Abduction (40) WNL WNL  Adduction  WNL WNL  Internal Rotation (45) WNL WNL  External Rotation (45) WNL WNL      Knee    Flexion (135) WNL WNL  Extension (0) WNL WNL      Ankle    Dorsiflexion (20) 10 (WB) 10 (WB)  Plantarflexion (50) 40 35  Inversion (35) 30 20  Eversion (15) 7 8  (* = pain; Blank rows = not tested)   LE MMT: MMT (out of 5) Right  Left   Hip flexion 4 4  Hip extension 4 4  Hip abduction 4 4+  Hip adduction 4+ 4  Hip internal rotation 5 5  Hip external rotation 5 5  Knee flexion 4 4  Knee extension 5 5  Ankle dorsiflexion 5 5  Ankle plantarflexion    Ankle inversion 5 5*  Ankle eversion 5 5*  (* = pain; Blank rows = not tested)  Sensation Diminished sensation left S1 and lateral aspect of left foot.  Reflexes Deferred  Palpation Location LEFT  RIGHT           Plantar fascia 1 0  Dorsal aspect of foot 1 0  Tarsal tunnel 1 0  (Blank rows = not tested) Graded on 0-4 scale (0 = no pain, 1 = pain, 2 = pain with wincing/grimacing/flinching, 3 = pain with withdrawal, 4 = unwilling to allow palpation), (Blank rows = not tested)  Passive Accessory Motion Superior Tibiofibular Joint: WNL Inferior Tibiofibular Joint: WNL Talocrural Joint Distraction: WNL but noted pain with "pulling feeling" with left foot Talocrural  Joint AP: L hypomobile, painful where hand is purchased Talocrural Joint PA: L hypomobile  SPECIAL TESTS Ligamentous Integrity Anterior Drawer (ATF, 10-15 plantarflexion with anterior translation): Negative Talar Tilt (CFL, inversion): Negative   TODAY'S TREATMENT: 09/18/2023   Subjective: Pt reports ongoing L knee and L ankle pain. She has had some episodes of L knee buckling (hyperextension) yesterday and this morning. She has also been having more medial L knee pain. Pain in hips and knees continues to be activity dependent. No specific questions or concerns.    PAIN: None reported at rest;   Ther-ex  NuStep L1-6 x 10 minutes for warm-up and BLE strengthening during interval history (5 minutes unbilled); Supine L quad set with towel behind knee 3s hold 2 x 10; Hooklying SAQ with manual resistance from therapist 2 x 10 BLE; Hooklying SLR with 3# ankle weights 2 x 10 BLE (4# and 5# are too difficult and painful); Hooklying clams with manual resistance 2 x 10; Hooklying adductor squeezes with manual resistance 2 x 10; Hooklying bridges 2 x 10; Supine manually resisted leg press 2 x 10 BLE; Supine manually resisted straight knee abduction 2 x 10 BLE;   Not today:  Heel raises with UE support x 15 (stopped due to pain in hamstring); DF in WB 06/13/23: 10 degrees BLE Ankle pumps 2 x 30s BLE; Seated ankle circumduction 2 x 30s each direction BLE; Seated resisted PF with green tband x 20 BLE; Seated resisted inversion with green tband x 20 BLE; Seated resisted eversion with green tband x 20 BLE; Supine isometric DF 3 x 20s BLE; Supine isometric PF 3 x 20s BLE; Ankle DF stretch 3 x 30s BLE; 6" lateral step downs to 2" Airex pad x 10 BLE; Pball straight knee bridges x 10; Prone L knee hamstring curls with manual resistance from therapist 2 x 10; Prone LLE straight knee hip extension 2 x 10; Prone L hip ER with knee at 90 flexion and manual resistance from therapist 2 x 10; STM to  L peroneus brevis and longus;   PATIENT EDUCATION:  Education details: Pt educated throughout session about proper posture and technique with exercises. Improved exercise technique, movement at target joints, use of target muscles after min to mod verbal, visual, tactile cues.  Person educated: Patient Education method: Explanation, Demonstration, and Tactile cues Education comprehension: verbalized understanding and returned demonstration   HOME EXERCISE PROGRAM:  Access Code: [email or web address] URL: [email or web address] Date: 06/13/2023 Prepared by: Crawford Dock  Exercises - Seated Ankle Eversion with Resistance  - 1 x daily - 7 x weekly - 2 sets - 10 reps - 3s hold - Seated Ankle Inversion with Resistance and Legs Crossed (Mirrored)  - 1 x daily - 7 x weekly - 2 sets - 10 reps - 3s hold - Seated Ankle Plantarflexion with Resistance  - 1 x daily - 7 x weekly - 3    sets - 10 reps - 3s hold - Long Sitting Calf Stretch with Strap  - 1 x daily - 7 x weekly - 3 reps - 30-45s hold - Standing Gastroc Stretch  on Step  - 1 x daily - 7 x weekly - 3 sets - 10 reps - Standing Dorsiflexion Self-Mobilization on Step  - 1 x daily - 7 x weekly - 3 sets - 10 reps - Standing Dorsiflexion Self-Mobilization on Step (Mirrored)  - 1 x daily - 7 x weekly - 3 sets - 10 reps - Standing on Blue foam x 30 secs with EC -heel raises on Blue foam 20 reps   ASSESSMENT:  CLINICAL IMPRESSION: Session focused on strengthening for hips and knees bilaterally. Attempted L SLR with 4 and 5# ankle weights but too difficult/painful so regressed to 3#. Otherwise no pain reported during strengthening. No HEP modifications today but will plan to progress at future visits as appropriate. Pt encouraged to follow-up as scheduled. She will benefit from PT services to address deficits in strength and pain in order to return to full function at home and with leisure activities.   OBJECTIVE IMPAIRMENTS: decreased strength  and hypomobility.   ACTIVITY LIMITATIONS: stairs and locomotion level  PARTICIPATION LIMITATIONS: occupation and yard work  PERSONAL FACTORS: Age, Behavior pattern, Past/current experiences, Time since onset of injury/illness/exacerbation, and 1-2 comorbidities: type II diabetes and left lumbar radiculopathy  are also affecting patient's functional outcome.   REHAB POTENTIAL: Good  CLINICAL DECISION MAKING: Evolving/moderate complexity  EVALUATION COMPLEXITY: Moderate   GOALS: Goals reviewed with patient? Yes  SHORT TERM GOALS: Target date:   Pt will be independent with HEP to improve strength and decrease ankle pain to improve pain-free function at home and work. Baseline:  Goal status: ACHIEVED   LONG TERM GOALS: Target date: 11/27/23  1.  Pt will decrease worst ankle pain by at least 3 points on the NPRS in order to demonstrate clinically significant reduction in ankle pain. Baseline: 06/08/23: 1/10 best, 5/10 worst; 08/14/23: worst: 1/10; Goal status: ACHIEVED  2.  Pt will increase FADI score by at least 4 points in order demonstrate clinically significant reduction in ankle pain/disability.       Baseline: 06/13/23: 65%; 08/14/23: 80 / 104 or 77% Goal status: ACHIEVED  3.  Pt will improve strength of ankle inversion/eversion with limited to no pain in order to demonstrate improvement in strength and function.  Baseline: 06/08/23: 5/5 with pain; 08/14/23: 5/5 without pain Goal status: ACHIEVED  4.  Pt will report at least 50% improvement in L ankle and knee symptoms in order to demonstrate improvement in her ability to work in the yard/garden with less pain.  Baseline: 08/14/23: 75% improvement Goal status: ACHIEVED  5.  Pt will decrease HAGOS score by at least 20 points in order demonstrate clinically significant reduction in hip pain/disability.  Baseline: 08/14/23: HAGOS Score: 37.7% (Symptoms Score: 50%; Pain Score: 37.5%; Physical Function, Daily Living Score: 45%; Function,  Sports, and Recreational Activities Score: 43.7%; Participation in Physical Activities Score: 25%; Quality of Life Score: 25 %) Goal status: INITIAL  6.  Pt will decrease worst posterior R hip pain by at least 3 points on the NPRS in order to demonstrate clinically significant reduction in hip pain. Baseline: 08/14/23: worst: 8/10; Goal status: INITIAL   PLAN: PT FREQUENCY: 1-2x/week  PT DURATION: 12 weeks  PLANNED INTERVENTIONS: Therapeutic exercises, Therapeutic activity, Neuromuscular re-education, Balance training, Gait training, Patient/Family education, Self Care, Joint mobilization, Joint manipulation, Vestibular training, Canalith repositioning, Orthotic/Fit training, DME instructions, Dry Needling, Electrical stimulation, Spinal manipulation, Spinal mobilization, Cryotherapy, Moist heat, Taping, Traction, Ultrasound, Ionotophoresis 4mg /ml Dexamethasone , Manual therapy, and Re-evaluation.  PLAN FOR NEXT SESSION:  strengthening and mobility exercises focused on R hip pain, review and modify HEP as necessary  Shadeed Colberg D Ryot Burrous PT, DPT, GCS  10:13 AM,09/18/23

## 2023-09-18 ENCOUNTER — Ambulatory Visit: Attending: Family Medicine

## 2023-09-18 DIAGNOSIS — G8929 Other chronic pain: Secondary | ICD-10-CM | POA: Insufficient documentation

## 2023-09-18 DIAGNOSIS — M25562 Pain in left knee: Secondary | ICD-10-CM | POA: Insufficient documentation

## 2023-09-18 DIAGNOSIS — M25551 Pain in right hip: Secondary | ICD-10-CM | POA: Diagnosis not present

## 2023-09-18 DIAGNOSIS — M6281 Muscle weakness (generalized): Secondary | ICD-10-CM | POA: Diagnosis not present

## 2023-09-25 ENCOUNTER — Ambulatory Visit

## 2023-09-27 NOTE — Progress Notes (Signed)
 [facility] Summary Report / Loop Recorder

## 2023-09-27 NOTE — Therapy (Signed)
 OUTPATIENT PHYSICAL THERAPY ANKLE/KNEE/HIP TREATMENT  Patient Name: Brandy [name] MRN: [id number] DOB:[birth year], 68 y.o., female Today's Date: 10/02/2023  END OF SESSION:  PT End of Session - 10/02/23 0926     Visit Number 15    Number of Visits 49    Date for PT Re-Evaluation 11/27/23    Authorization Type eval: 06/08/23    PT Start Time 0847    PT Stop Time 0932    PT Time Calculation (min) 45 min    Activity Tolerance Patient tolerated treatment well    Behavior During Therapy [facility] for tasks assessed/performed             Past Medical History:  Diagnosis Date   Acute lower GI bleeding 01/19/2020   Arthritis    Atrial fibrillation with rapid ventricular response (HCC)    Chicken pox    Chronic systolic CHF (congestive heart failure) (HCC)    a. 03/2016 Echo: Ef 15-20%, diff HK, ant AK;  b. 05/2016 Echo: Ef 30-35%, diff HK, mildly dil LA/RA.   Diabetes mellitus without complication (HCC)    Dysrhythmia    GERD (gastroesophageal reflux disease)    Heart murmur    Hip discomfort    been going on for 40 years    History of kidney stones    Hyperlipidemia associated with type 2 diabetes mellitus (HCC) 05/12/2021   Hypertension    Moderate mitral regurgitation    a. 03/2016 Echo: mod MR in setting of LV dysfxn.   Motion sickness    car - back seat   NICM (nonischemic cardiomyopathy) (HCC)    a. 03/2016 Echo: EF 15-20%, diff HK, ant AK, mod MR, mod dil LA, mildly dil RA;  b. 04/2016 Cath: nl cors;  c. 05/2016 Echo: EF 30-35%, diff HK.   Obesity    Obstructive sleep apnea    compliant with CPAP   Persistent atrial fibrillation (HCC)    a. Dx 03/2016;  b. CHA2DS2VASc = 2-->Eliquis  5mg  BID;  b. 05/2016 Failed DCCV x 4.   Sleep apnea    Stomach irritation    Visit for monitoring Tikosyn  therapy 07/24/2016   Past Surgical History:  Procedure Laterality Date   ATRIAL FIBRILLATION ABLATION N/A 12/09/2020   Procedure: ATRIAL FIBRILLATION ABLATION;  Surgeon: Boyce Byes,  MD;  Location: [facility];  Service: Cardiovascular;  Laterality: N/A;   BIOPSY N/A 01/12/2020   Procedure: BIOPSY;  Surgeon: Irby Mannan, MD;  Location: [facility];  Service: Endoscopy;  Laterality: N/A;   CARDIAC CATHETERIZATION N/A 04/17/2016   Procedure: Left Heart Cath and Coronary Angiography;  Surgeon: Wenona Hamilton, MD;  Location: [facility] INV[facility] CV [facility];  Service: Cardiovascular;  Laterality: N/A;   CARDIOVERSION N/A 10/22/2020   Procedure: CARDIOVERSION;  Surgeon: Sammy Crisp, MD;  Location: [facility] OR[facility];  Service: Cardiovascular;  Laterality: N/A;   CHOLECYSTECTOMY     COLON SURGERY  2021   COLONOSCOPY N/A 01/20/2020   Procedure: COLONOSCOPY;  Surgeon: Shane Darling, MD;  Location: [facility] ENDOS[facility];  Service: Endoscopy;  Laterality: N/A;   COLONOSCOPY WITH PROPOFOL  N/A 01/12/2020   Procedure: COLONOSCOPY WITH PROPOFOL ;  Surgeon: Irby Mannan, MD;  Location: [facility];  Service: Endoscopy;  Laterality: N/A;  Diabetic - oral meds sleep apnea   ELECTROPHYSIOLOGIC STUDY N/A 05/26/2016   Procedure: Cardioversion;  Surgeon: Wenona Hamilton, MD;  Location: [facility] OR[facility];  Service: Cardiovascular;  Laterality: N/A;   ESOPHAGOGASTRODUODENOSCOPY (EGD) WITH PROPOFOL  N/A 01/12/2020  Procedure: ESOPHAGOGASTRODUODENOSCOPY (EGD) WITH PROPOFOL ;  Surgeon: Irby Mannan, MD;  Location: [facility];  Service: Endoscopy;  Laterality: N/A;   POLYPECTOMY N/A 01/12/2020   Procedure: POLYPECTOMY;  Surgeon: Irby Mannan, MD;  Location: [facility];  Service: Endoscopy;  Laterality: N/A;   Patient Active Problem List   Diagnosis Date Noted   Mucous cyst of digit of left hand 05/18/2023   Peroneal tendinitis, left 10/13/2022   Left lumbar radiculopathy 07/26/2022   Arthralgia of left knee 07/26/2022   Pes anserinus bursitis of right knee 04/19/2022   Primary osteoarthritis of right knee 03/27/2022   Greater trochanteric pain syndrome of  right lower extremity 03/27/2022   It band syndrome, right 03/27/2022   Hyperlipidemia associated with type 2 diabetes mellitus (HCC) 05/12/2021   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   Varicose veins of leg with pain, bilateral 07/06/2020   Type II diabetes mellitus with complication (HCC) 01/19/2020   Gastric polyp    Colon polyps    Varicose veins of both lower extremities 10/20/2019   Dysphagia 10/20/2019   Osteoarthritis of right hand 10/31/2016   Nonischemic cardiomyopathy (HCC) 04/14/2016   BMI 33.0-33.9,adult 04/14/2016    PCP: Janna Melter, MD  REFERRING PROVIDER: Rebekah Canada, MD  REFERRING DIAG:  [phone] (ICD-10-CM) - Peroneal tendinitis, left  M54.16 (ICD-10-CM) - Left lumbar radiculopathy  M25.562 (ICD-10-CM) - Arthralgia of left knee  M17.11 (ICD-10-CM) - Primary osteoarthritis of right knee  M76.31 (ICD-10-CM) - It band syndrome, right  M25.551 (ICD-10-CM) - Greater 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   Rationale for Evaluation and Treatment: Rehabilitation  THERAPY DIAG: Muscle weakness (generalized)  Pain in right hip  Chronic pain of left knee  ONSET DATE: Chronic, aggravated over Christmas (December 2024)  FOLLOW-UP APPT SCHEDULED WITH REFERRING PROVIDER: No, initially no but now has appt for July;  INITIAL EVALUATION: SUBJECTIVE:                                                                                                                                                                                         SUBJECTIVE STATEMENT:  Left ankle and foot pain  PERTINENT HISTORY: Pt reports pain in left ankle that seems to have "flared up over Christmas" after pt reported excess walking. Pt reports feeling "pulling and burning at the base of the lateral ankle." She has previously been to PT for pain in her left hip and knee but has now noticed increased pain in the left ankle/foot. Pt expresses difficulty with exercises;  specifically pressure on the toes, stretching calves, and rocking on toes. Pt notes pain is as high as 5/10  NPS but is constant and "never really goes away," with lowest pain at 1/10 NPS.  PAIN:    Pain Intensity: Present: 1/10, Best: 1/10, Worst: 5/10 Pain location: Lateral foot and ankle Pain Quality: constant and burning  Radiating: Yes  Swelling: No  Popping, catching, locking: No  Numbness/Tingling: No Focal Weakness: Yes Aggravating factors: Touching lateral ankle, moving the ankle, ascending steps when leading with left foot, walking Relieving factors: Stopping activity, resting, brace temporarily relieves some pain 24-hour pain behavior: No night pain when sleeping on back, stretching in AM helps but feels unstable, PM pain depends on activity during the day History of prior back, hip, knee, or ankle injury, pain, surgery, or therapy: Yes; chronic hip, knee, foot pain Dominant hand: right Imaging: No  Typical footwear: Sneakers or rubber boots Red flags: Negative for personal history of cancer, chills/fever, night sweats, nausea, vomiting, unrelenting pain): Negative  PRECAUTIONS: None  WEIGHT BEARING RESTRICTIONS: No  FALLS: Has patient fallen in last 6 months? 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  Recent memory is intact.  Remote memory is intact.  Attention span and concentration are intact.  Expressive speech is intact.  Patient's fund of knowledge is within normal limits for educational level.    Gross Musculoskeletal Assessment Bulk: Normal Tone: Normal No trophic changes noted to foot/ankle. No ecchymosis, erythema, or edema noted. No gross ankle/foot deformity noted  GAIT: Comments: Mild toe out gait on right foot. Antalgic gait noted on left side.  Posture: No noted discrepancies.  AROM AROM (Normal range in degrees) AROM       Hip Right Left  Flexion (125) WNL WNL  Extension (15) WNL WNL  Abduction (40) WNL WNL  Adduction  WNL WNL  Internal Rotation (45) WNL WNL  External Rotation (45) WNL WNL      Knee    Flexion (135) WNL WNL  Extension (0) WNL WNL      Ankle    Dorsiflexion (20) 10 (WB) 10 (WB)  Plantarflexion (50) 40 35  Inversion (35) 30 20  Eversion (15) 7 8  (* = pain; Blank rows = not tested)   LE MMT: MMT (out of 5) Right  Left   Hip flexion 4 4  Hip extension 4 4  Hip abduction 4 4+  Hip adduction 4+ 4  Hip internal rotation 5 5  Hip external rotation 5 5  Knee flexion 4 4  Knee extension 5 5  Ankle dorsiflexion 5 5  Ankle plantarflexion    Ankle inversion 5 5*  Ankle eversion 5 5*  (* = pain; Blank rows = not tested)  Sensation Diminished sensation left S1 and lateral aspect of left foot.  Reflexes Deferred  Palpation Location LEFT  RIGHT           Plantar fascia 1 0  Dorsal aspect of foot 1 0  Tarsal tunnel 1 0  (Blank rows = not tested) Graded on 0-4 scale (0 = no pain, 1 = pain, 2 = pain with wincing/grimacing/flinching, 3 = pain with withdrawal, 4 = unwilling to allow palpation), (Blank rows = not tested)  Passive Accessory Motion Superior Tibiofibular Joint: WNL Inferior Tibiofibular Joint: WNL Talocrural Joint Distraction: WNL but noted pain with "pulling feeling" with  left foot Talocrural Joint AP: L hypomobile, painful where hand is purchased Talocrural Joint PA: L hypomobile  SPECIAL TESTS Ligamentous Integrity Anterior Drawer (ATF, 10-15 plantarflexion with anterior translation): Negative Talar Tilt (CFL, inversion): Negative   TODAY'S TREATMENT: 10/02/2023   Subjective: Pt reports L plantar forefoot pain upon arrival today. She has also been having worsening R lateral hip pain recently. No specific questions or concerns.    PAIN: None reported at rest;   Ther-ex  NuStep L1-6 x 10 minutes for warm-up and BLE strengthening during interval history (5 minutes unbilled); Hooklying isometric clams with belt 30s hold/30s relax x 5; Hooklying adductor ball squeezes with manual resistance 2 x 15; Hooklying bridges with blue tband around knees for abduction/ER 2 x 10; Discussion regarding etiology of tendinopathy and respective interventions;   Manual Therapy  L sidelying R lateral hip/thigh STM with theraband roller to R hip abductors, glut max, and IT band;   Not today:  Heel raises with UE support x 15 (stopped due to pain in hamstring); DF in WB 06/13/23: 10 degrees BLE Ankle pumps 2 x 30s BLE; Seated ankle circumduction 2 x 30s each direction BLE; Seated resisted PF with green tband x 20 BLE; Seated resisted inversion with green tband x 20 BLE; Seated resisted eversion with green tband x 20 BLE; Supine isometric DF 3 x 20s BLE; Supine isometric PF 3 x 20s BLE; Ankle DF stretch 3 x 30s BLE; 6" lateral step downs to 2" Airex pad x 10 BLE; Pball straight knee bridges x 10; Prone L knee hamstring curls with manual resistance from therapist 2 x 10; Prone LLE straight knee hip extension 2 x 10; Prone L hip ER with knee at 90 flexion and manual resistance from therapist 2 x 10; STM to L peroneus brevis and longus;   PATIENT EDUCATION:  Education details: Pt educated throughout session about proper posture and technique with exercises.  Improved exercise technique, movement at target joints, use of target muscles after min to mod verbal, visual, tactile cues.  Person educated: Patient Education method: Explanation, Demonstration, and Tactile cues Education comprehension: verbalized understanding and returned demonstration   HOME EXERCISE PROGRAM:  Access Code: [email or web address] URL: [email or web address] Date: 06/13/2023 Prepared by: Crawford Dock  Exercises - Seated Ankle Eversion with Resistance  - 1 x daily - 7 x weekly - 2 sets - 10 reps - 3s hold - Seated Ankle Inversion with Resistance and Legs Crossed (Mirrored)  - 1 x daily - 7 x weekly - 2 sets - 10 reps - 3s hold - Seated Ankle Plantarflexion with Resistance  - 1 x daily - 7 x weekly - 3    sets - 10 reps - 3s hold - Long Sitting Calf Stretch with Strap  - 1 x daily - 7 x weekly - 3 reps - 30-45s hold - Standing Gastroc Stretch on Step  - 1 x daily - 7 x weekly - 3 sets - 10 reps - Standing Dorsiflexion Self-Mobilization on Step  - 1 x daily - 7 x weekly - 3 sets -  10 reps - Standing Dorsiflexion Self-Mobilization on Step (Mirrored)  - 1 x daily - 7 x weekly - 3 sets - 10 reps - Standing on Blue foam x 30 secs with EC -heel raises on Blue foam 20 reps   ASSESSMENT:  CLINICAL IMPRESSION: Pt with increase in R lateral hip pain which appears to be related to tendinopathy. Education provided regarding the role of modalities and progressive strengthening in rehab. Initiated isometrics and discussed HEP regressions/progressions. Pt encouraged to follow-up as scheduled. She will benefit from PT services to address deficits in strength and pain in order to return to full function at home and with leisure activities.   OBJECTIVE IMPAIRMENTS: decreased strength and hypomobility.   ACTIVITY LIMITATIONS: stairs and locomotion level  PARTICIPATION LIMITATIONS: occupation and yard work  PERSONAL FACTORS: Age, Behavior pattern, Past/current experiences, Time  since onset of injury/illness/exacerbation, and 1-2 comorbidities: type II diabetes and left lumbar radiculopathy are also affecting patient's functional outcome.   REHAB POTENTIAL: Good  CLINICAL DECISION MAKING: Evolving/moderate complexity  EVALUATION COMPLEXITY: Moderate   GOALS: Goals reviewed with patient? Yes  SHORT TERM GOALS: Target date:   Pt will be independent with HEP to improve strength and decrease ankle pain to improve pain-free function at home and work. Baseline:  Goal status: ACHIEVED   LONG TERM GOALS: Target date: 11/27/23  1.  Pt will decrease worst ankle pain by at least 3 points on the NPRS in order to demonstrate clinically significant reduction in ankle pain. Baseline: 06/08/23: 1/10 best, 5/10 worst; 08/14/23: worst: 1/10; Goal status: ACHIEVED  2.  Pt will increase FADI score by at least 4 points in order demonstrate clinically significant reduction in ankle pain/disability.       Baseline: 06/13/23: 65%; 08/14/23: 80 / 104 or 77% Goal status: ACHIEVED  3.  Pt will improve strength of ankle inversion/eversion with limited to no pain in order to demonstrate improvement in strength and function.  Baseline: 06/08/23: 5/5 with pain; 08/14/23: 5/5 without pain Goal status: ACHIEVED  4.  Pt will report at least 50% improvement in L ankle and knee symptoms in order to demonstrate improvement in her ability to work in the yard/garden with less pain.  Baseline: 08/14/23: 75% improvement Goal status: ACHIEVED  5.  Pt will decrease HAGOS score by at least 20 points in order demonstrate clinically significant reduction in hip pain/disability.  Baseline: 08/14/23: HAGOS Score: 37.7% (Symptoms Score: 50%; Pain Score: 37.5%; Physical Function, Daily Living Score: 45%; Function, Sports, and Recreational Activities Score: 43.7%; Participation in Physical Activities Score: 25%; Quality of Life Score: 25 %) Goal status: INITIAL  6.  Pt will decrease worst posterior R hip pain by  at least 3 points on the NPRS in order to demonstrate clinically significant reduction in hip pain. Baseline: 08/14/23: worst: 8/10; Goal status: INITIAL   PLAN: PT FREQUENCY: 1-2x/week  PT DURATION: 12 weeks  PLANNED INTERVENTIONS: Therapeutic exercises, Therapeutic activity, Neuromuscular re-education, Balance training, Gait training, Patient/Family education, Self Care, Joint mobilization, Joint manipulation, Vestibular training, Canalith repositioning, Orthotic/Fit training, DME instructions, Dry Needling, Electrical stimulation, Spinal manipulation, Spinal mobilization, Cryotherapy, Moist heat, Taping, Traction, Ultrasound, Ionotophoresis 4mg /ml Dexamethasone , Manual therapy, and Re-evaluation.  PLAN FOR NEXT SESSION: strengthening and mobility exercises focused on R hip pain, review and modify HEP as necessary  Clint Biello D Kemani Heidel PT, DPT, GCS  11:33 AM,10/02/23

## 2023-09-28 ENCOUNTER — Telehealth: Payer: Self-pay

## 2023-09-28 ENCOUNTER — Encounter (HOSPITAL_COMMUNITY): Payer: Self-pay

## 2023-09-28 NOTE — Telephone Encounter (Addendum)
 Alert received from [employer] for AF episode event occurred 5/16 @ 03:55, likely ongoing, poor rate contorl.  Presenting noisy - route to triage. Eliquis  per EPIC notes, not on MAR, program from balanced to less.  Attempted to contact patient. No answer, left message to call back.     Updated presenting rhythm at 09/28/23 @ 08:11 am.

## 2023-09-28 NOTE — Telephone Encounter (Addendum)
 Patient reports shortness of breath this morning and noted her heart rate was increased. Patient now is asymptomatic.  Reports missing metoprolol  25 mg "for a while now." Stressed compliance with medications as this can contribute to increased heart rates. Patient voiced understanding.   Patient has seen AF clinic is past. Will forward to see if they would like to see patient in office to monitor since patient was educated on strict compliance with medications.

## 2023-10-01 ENCOUNTER — Encounter: Payer: Self-pay | Admitting: *Deleted

## 2023-10-01 NOTE — Telephone Encounter (Signed)
 Additional alert received from [employer]:  Alert remote transmission: AF 4 AF events, longest x 54 min. 16% burden in the past day.  V-rates remain poorly controlled.  Clinic updated on 5/16.  Not on OAC per EMR.  Sending for update due to recurrance.   Pt advised to follow up with Afib clinic.  Pt contacted by Afib clinic and refused appointment.  Please advise.

## 2023-10-02 ENCOUNTER — Ambulatory Visit

## 2023-10-02 DIAGNOSIS — M25562 Pain in left knee: Secondary | ICD-10-CM | POA: Diagnosis not present

## 2023-10-02 DIAGNOSIS — M6281 Muscle weakness (generalized): Secondary | ICD-10-CM | POA: Diagnosis not present

## 2023-10-02 DIAGNOSIS — G8929 Other chronic pain: Secondary | ICD-10-CM

## 2023-10-02 DIAGNOSIS — M25551 Pain in right hip: Secondary | ICD-10-CM

## 2023-10-03 NOTE — Telephone Encounter (Signed)
 Left message for patient to call back

## 2023-10-03 NOTE — Telephone Encounter (Signed)
 Spoke with the patient who states that she does not feel like she needs to be seen by anyone right now. She states that she wants to keep monitoring and if she has another episode she will come in for an appointment. She would prefer to see MD over AF clinic.

## 2023-10-04 DIAGNOSIS — M8580 Other specified disorders of bone density and structure, unspecified site: Secondary | ICD-10-CM | POA: Diagnosis not present

## 2023-10-04 DIAGNOSIS — D6869 Other thrombophilia: Secondary | ICD-10-CM | POA: Diagnosis not present

## 2023-10-09 ENCOUNTER — Encounter

## 2023-10-16 ENCOUNTER — Ambulatory Visit

## 2023-10-17 ENCOUNTER — Ambulatory Visit: Attending: Family Medicine

## 2023-10-17 DIAGNOSIS — G8929 Other chronic pain: Secondary | ICD-10-CM | POA: Diagnosis not present

## 2023-10-17 DIAGNOSIS — M6281 Muscle weakness (generalized): Secondary | ICD-10-CM | POA: Diagnosis not present

## 2023-10-17 DIAGNOSIS — M25551 Pain in right hip: Secondary | ICD-10-CM | POA: Diagnosis not present

## 2023-10-17 DIAGNOSIS — M25562 Pain in left knee: Secondary | ICD-10-CM | POA: Insufficient documentation

## 2023-10-17 NOTE — Therapy (Signed)
 OUTPATIENT PHYSICAL THERAPY ANKLE/KNEE/HIP TREATMENT  Patient Name: Brandy [name] MRN: [id number] DOB:31-Jan-1956, 68 y.o., female Today's Date: 10/17/2023  END OF SESSION:  PT End of Session - 10/17/23 0802     Visit Number 16    Number of Visits 49    Date for PT Re-Evaluation 11/27/23    Authorization Type eval: 06/08/23    PT Start Time 0805    PT Stop Time 0845    PT Time Calculation (min) 40 min    Activity Tolerance Patient tolerated treatment well    Behavior During Therapy [facility] for tasks assessed/performed            Past Medical History:  Diagnosis Date   Acute lower GI bleeding 01/19/2020   Arthritis    Atrial fibrillation with rapid ventricular response (HCC)    Chicken pox    Chronic systolic CHF (congestive heart failure) (HCC)    a. 03/2016 Echo: Ef 15-20%, diff HK, ant AK;  b. 05/2016 Echo: Ef 30-35%, diff HK, mildly dil LA/RA.   Diabetes mellitus without complication (HCC)    Dysrhythmia    GERD (gastroesophageal reflux disease)    Heart murmur    Hip discomfort    been going on for 40 years    History of kidney stones    Hyperlipidemia associated with type 2 diabetes mellitus (HCC) 05/12/2021   Hypertension    Moderate mitral regurgitation    a. 03/2016 Echo: mod MR in setting of LV dysfxn.   Motion sickness    car - back seat   NICM (nonischemic cardiomyopathy) (HCC)    a. 03/2016 Echo: EF 15-20%, diff HK, ant AK, mod MR, mod dil LA, mildly dil RA;  b. 04/2016 Cath: nl cors;  c. 05/2016 Echo: EF 30-35%, diff HK.   Obesity    Obstructive sleep apnea    compliant with CPAP   Persistent atrial fibrillation (HCC)    a. Dx 03/2016;  b. CHA2DS2VASc = 2-->Eliquis  5mg  BID;  b. 05/2016 Failed DCCV x 4.   Sleep apnea    Stomach irritation    Visit for monitoring Tikosyn  therapy 07/24/2016   Past Surgical History:  Procedure Laterality Date   ATRIAL FIBRILLATION ABLATION N/A 12/09/2020   Procedure: ATRIAL FIBRILLATION ABLATION;  Surgeon: Boyce Byes, MD;   Location: [facility];  Service: Cardiovascular;  Laterality: N/A;   BIOPSY N/A 01/12/2020   Procedure: BIOPSY;  Surgeon: Irby Mannan, MD;  Location: [facility];  Service: Endoscopy;  Laterality: N/A;   CARDIAC CATHETERIZATION N/A 04/17/2016   Procedure: Left Heart Cath and Coronary Angiography;  Surgeon: Wenona Hamilton, MD;  Location: [facility] INVASIVE CV LAB;  Service: Cardiovascular;  Laterality: N/A;   CARDIOVERSION N/A 10/22/2020   Procedure: CARDIOVERSION;  Surgeon: Sammy Crisp, MD;  Location: [facility] ORS;  Service: Cardiovascular;  Laterality: N/A;   CHOLECYSTECTOMY     COLON SURGERY  2021   COLONOSCOPY N/A 01/20/2020   Procedure: COLONOSCOPY;  Surgeon: Shane Darling, MD;  Location: [facility] ENDOSCOPY;  Service: Endoscopy;  Laterality: N/A;   COLONOSCOPY WITH PROPOFOL  N/A 01/12/2020   Procedure: COLONOSCOPY WITH PROPOFOL ;  Surgeon: Irby Mannan, MD;  Location: [facility];  Service: Endoscopy;  Laterality: N/A;  Diabetic - oral meds sleep apnea   ELECTROPHYSIOLOGIC STUDY N/A 05/26/2016   Procedure: Cardioversion;  Surgeon: Wenona Hamilton, MD;  Location: [facility] ORS;  Service: Cardiovascular;  Laterality: N/A;   ESOPHAGOGASTRODUODENOSCOPY (EGD) WITH PROPOFOL  N/A 01/12/2020   Procedure:  ESOPHAGOGASTRODUODENOSCOPY (EGD) WITH PROPOFOL ;  Surgeon: Irby Mannan, MD;  Location: [facility];  Service: Endoscopy;  Laterality: N/A;   POLYPECTOMY N/A 01/12/2020   Procedure: POLYPECTOMY;  Surgeon: Irby Mannan, MD;  Location: [facility];  Service: Endoscopy;  Laterality: N/A;   Patient Active Problem List   Diagnosis Date Noted   Mucous cyst of digit of left hand 05/18/2023   Peroneal tendinitis, left 10/13/2022   Left lumbar radiculopathy 07/26/2022   Arthralgia of left knee 07/26/2022   Pes anserinus bursitis of right knee 04/19/2022   Primary osteoarthritis of right knee 03/27/2022   Greater trochanteric pain syndrome of  right lower extremity 03/27/2022   It band syndrome, right 03/27/2022   Hyperlipidemia associated with type 2 diabetes mellitus (HCC) 05/12/2021   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   Varicose veins of leg with pain, bilateral 07/06/2020   Type II diabetes mellitus with complication (HCC) 01/19/2020   Gastric polyp    Colon polyps    Varicose veins of both lower extremities 10/20/2019   Dysphagia 10/20/2019   Osteoarthritis of right hand 10/31/2016   Nonischemic cardiomyopathy (HCC) 04/14/2016   BMI 33.0-33.9,adult 04/14/2016    PCP: Janna Melter, MD  REFERRING PROVIDER: Rebekah Canada, MD  REFERRING DIAG:  [phone] (ICD-10-CM) - Peroneal tendinitis, left  M54.16 (ICD-10-CM) - Left lumbar radiculopathy  M25.562 (ICD-10-CM) - Arthralgia of left knee  M17.11 (ICD-10-CM) - Primary osteoarthritis of right knee  M76.31 (ICD-10-CM) - It band syndrome, right  M25.551 (ICD-10-CM) - Greater 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   Rationale for Evaluation and Treatment: Rehabilitation  THERAPY DIAG: Muscle weakness (generalized)  Pain in right hip  Chronic pain of left knee  ONSET DATE: Chronic, aggravated over Christmas (December 2024)  FOLLOW-UP APPT SCHEDULED WITH REFERRING PROVIDER: No, initially no but now has appt for July;  INITIAL EVALUATION: SUBJECTIVE:                                                                                                                                                                                         SUBJECTIVE STATEMENT:  Left ankle and foot pain  PERTINENT HISTORY: Pt reports pain in left ankle that seems to have "flared up over Christmas" after pt reported excess walking. Pt reports feeling "pulling and burning at the base of the lateral ankle." She has previously been to PT for pain in her left hip and knee but has now noticed increased pain in the left ankle/foot. Pt expresses difficulty with exercises;  specifically pressure on the toes, stretching calves, and rocking on toes. Pt notes pain is as high as 5/10 NPS  but is constant and "never really goes away," with lowest pain at 1/10 NPS.  PAIN:    Pain Intensity: Present: 1/10, Best: 1/10, Worst: 5/10 Pain location: Lateral foot and ankle Pain Quality: constant and burning  Radiating: Yes  Swelling: No  Popping, catching, locking: No  Numbness/Tingling: No Focal Weakness: Yes Aggravating factors: Touching lateral ankle, moving the ankle, ascending steps when leading with left foot, walking Relieving factors: Stopping activity, resting, brace temporarily relieves some pain 24-hour pain behavior: No night pain when sleeping on back, stretching in AM helps but feels unstable, PM pain depends on activity during the day History of prior back, hip, knee, or ankle injury, pain, surgery, or therapy: Yes; chronic hip, knee, foot pain Dominant hand: right Imaging: No  Typical footwear: Sneakers or rubber boots Red flags: Negative for personal history of cancer, chills/fever, night sweats, nausea, vomiting, unrelenting pain): Negative  PRECAUTIONS: None  WEIGHT BEARING RESTRICTIONS: No  FALLS: Has patient fallen in last 6 months? 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  Recent memory is intact.  Remote memory is intact.  Attention span and concentration are intact.  Expressive speech is intact.  Patient's fund of knowledge is within normal limits for educational level.    Gross Musculoskeletal Assessment Bulk: Normal Tone: Normal No trophic changes noted to foot/ankle. No ecchymosis, erythema, or edema noted. No gross ankle/foot deformity noted  GAIT: Comments: Mild toe out gait on right foot. Antalgic gait noted on left side.  Posture: No noted discrepancies.  AROM AROM (Normal range in degrees) AROM       Hip Right Left  Flexion (125) WNL WNL  Extension (15) WNL WNL  Abduction (40) WNL WNL  Adduction  WNL WNL  Internal Rotation (45) WNL WNL  External Rotation (45) WNL WNL      Knee    Flexion (135) WNL WNL  Extension (0) WNL WNL      Ankle    Dorsiflexion (20) 10 (WB) 10 (WB)  Plantarflexion (50) 40 35  Inversion (35) 30 20  Eversion (15) 7 8  (* = pain; Blank rows = not tested)   LE MMT: MMT (out of 5) Right  Left   Hip flexion 4 4  Hip extension 4 4  Hip abduction 4 4+  Hip adduction 4+ 4  Hip internal rotation 5 5  Hip external rotation 5 5  Knee flexion 4 4  Knee extension 5 5  Ankle dorsiflexion 5 5  Ankle plantarflexion    Ankle inversion 5 5*  Ankle eversion 5 5*  (* = pain; Blank rows = not tested)  Sensation Diminished sensation left S1 and lateral aspect of left foot.  Reflexes Deferred  Palpation Location LEFT  RIGHT           Plantar fascia 1 0  Dorsal  aspect of foot 1 0  Tarsal tunnel 1 0  (Blank rows = not tested) Graded on 0-4 scale (0 = no pain, 1 = pain, 2 = pain with wincing/grimacing/flinching, 3 = pain with withdrawal, 4 = unwilling to allow palpation), (Blank rows = not tested)  Passive Accessory Motion Superior Tibiofibular Joint: WNL Inferior Tibiofibular Joint: WNL Talocrural Joint Distraction: WNL but noted pain with "pulling feeling" with  left foot Talocrural Joint AP: L hypomobile, painful where hand is purchased Talocrural Joint PA: L hypomobile  SPECIAL TESTS Ligamentous Integrity Anterior Drawer (ATF, 10-15 plantarflexion with anterior translation): Negative Talar Tilt (CFL, inversion): Negative   TODAY'S TREATMENT: 10/02/2023   Subjective: Pt reports ongoing L plantar forefoot pain upon arrival today. She has seen Dr. Pink Bridges in the past without any plan to address the issue. She would like to see another podiatrist for a second opinion. Her R hip and L knee have been relatively stable. No specific questions or concerns.    PAIN: None reported at rest;   Therapeutic Activity NuStep L1-6 x 10 minutes for warm-up and BLE strengthening during interval history (5 minutes unbilled); Gait assessment without any excessive pronation noted bilaterally, trendelenburg noted bilaterally; Performed lateral and front step down with notable hip drop/knee valgus; 6" lateral step downs to 2" Airex pad 2 x 10 BLE; 6" forward step downs to 2" Airex pad x 10 BLE; Resisted side stepping with blue tband around ankles x multiple laps, performed twice; Seated alternating marches with blue tband around knees x 10 BLE; Seated clams with blue tband around knees x 10;   Not today:  Heel raises with UE support x 15 (stopped due to pain in hamstring); DF in WB 06/13/23: 10 degrees BLE Ankle pumps 2 x 30s BLE; Seated ankle circumduction 2 x 30s each direction BLE; Seated resisted PF with green tband x 20 BLE; Seated resisted inversion with green tband x 20 BLE; Seated resisted eversion with green tband x 20 BLE; Supine isometric DF 3 x 20s BLE; Supine isometric PF 3 x 20s BLE; Ankle DF stretch 3 x 30s BLE; Pball straight knee bridges x 10; Prone L knee hamstring curls with manual resistance from therapist 2 x 10; Prone LLE straight knee hip extension 2 x 10; Prone L hip ER with knee at 90 flexion and manual resistance from  therapist 2 x 10;   PATIENT EDUCATION:  Education details: Pt educated throughout session about proper posture and technique with exercises. Improved exercise technique, movement at target joints, use of target muscles after min to mod verbal, visual, tactile cues.  Person educated: Patient Education method: Explanation, Demonstration, and Tactile cues Education comprehension: verbalized understanding and returned demonstration   HOME EXERCISE PROGRAM:  Access Code: [email or web address] URL: [email or web address] Date: 06/13/2023 Prepared by: Crawford Dock  Exercises - Seated Ankle Eversion with Resistance  - 1 x daily - 7 x weekly - 2 sets - 10 reps - 3s hold - Seated Ankle Inversion with Resistance and Legs Crossed (Mirrored)  - 1 x daily - 7 x weekly - 2 sets - 10 reps - 3s hold - Seated Ankle Plantarflexion with Resistance  - 1 x daily - 7 x weekly - 3    sets - 10 reps - 3s hold - Long Sitting Calf Stretch with Strap  - 1 x daily - 7 x weekly - 3 reps - 30-45s hold - Standing Gastroc Stretch on Step  - 1 x daily - 7 x weekly - 3  sets - 10 reps - Standing Dorsiflexion Self-Mobilization on Step  - 1 x daily - 7 x weekly - 3 sets - 10 reps - Standing Dorsiflexion Self-Mobilization on Step (Mirrored)  - 1 x daily - 7 x weekly - 3 sets - 10 reps - Standing on Blue foam x 30 secs with EC -heel raises on Blue foam 20 reps   ASSESSMENT:  CLINICAL IMPRESSION: No excessive pronation noted during gait assessment. Mild swelling noted around dorsal aspect of L forefoot. Provided pt with name of Dr. Wilnette Haste DPM. Also provided pt name of [facility] as she is looking for a massage therapist. Pt with stable hip pain so progressed strengthening during session. No increase in hip or knee pain reported. Pt encouraged to follow-up as scheduled. She will benefit from PT services to address deficits in strength and pain in order to return to full function at home and with leisure  activities.   OBJECTIVE IMPAIRMENTS: decreased strength and hypomobility.   ACTIVITY LIMITATIONS: stairs and locomotion level  PARTICIPATION LIMITATIONS: occupation and yard work  PERSONAL FACTORS: Age, Behavior pattern, Past/current experiences, Time since onset of injury/illness/exacerbation, and 1-2 comorbidities: type II diabetes and left lumbar radiculopathy are also affecting patient's functional outcome.   REHAB POTENTIAL: Good  CLINICAL DECISION MAKING: Evolving/moderate complexity  EVALUATION COMPLEXITY: Moderate   GOALS: Goals reviewed with patient? Yes  SHORT TERM GOALS: Target date:   Pt will be independent with HEP to improve strength and decrease ankle pain to improve pain-free function at home and work. Baseline:  Goal status: ACHIEVED   LONG TERM GOALS: Target date: 11/27/23  1.  Pt will decrease worst ankle pain by at least 3 points on the NPRS in order to demonstrate clinically significant reduction in ankle pain. Baseline: 06/08/23: 1/10 best, 5/10 worst; 08/14/23: worst: 1/10; Goal status: ACHIEVED  2.  Pt will increase FADI score by at least 4 points in order demonstrate clinically significant reduction in ankle pain/disability.       Baseline: 06/13/23: 65%; 08/14/23: 80 / 104 or 77% Goal status: ACHIEVED  3.  Pt will improve strength of ankle inversion/eversion with limited to no pain in order to demonstrate improvement in strength and function.  Baseline: 06/08/23: 5/5 with pain; 08/14/23: 5/5 without pain Goal status: ACHIEVED  4.  Pt will report at least 50% improvement in L ankle and knee symptoms in order to demonstrate improvement in her ability to work in the yard/garden with less pain.  Baseline: 08/14/23: 75% improvement Goal status: ACHIEVED  5.  Pt will decrease HAGOS score by at least 20 points in order demonstrate clinically significant reduction in hip pain/disability.  Baseline: 08/14/23: HAGOS Score: 37.7% (Symptoms Score: 50%; Pain Score: 37.5%;  Physical Function, Daily Living Score: 45%; Function, Sports, and Recreational Activities Score: 43.7%; Participation in Physical Activities Score: 25%; Quality of Life Score: 25 %) Goal status: INITIAL  6.  Pt will decrease worst posterior R hip pain by at least 3 points on the NPRS in order to demonstrate clinically significant reduction in hip pain. Baseline: 08/14/23: worst: 8/10; Goal status: INITIAL   PLAN: PT FREQUENCY: 1-2x/week  PT DURATION: 12 weeks  PLANNED INTERVENTIONS: Therapeutic exercises, Therapeutic activity, Neuromuscular re-education, Balance training, Gait training, Patient/Family education, Self Care, Joint mobilization, Joint manipulation, Vestibular training, Canalith repositioning, Orthotic/Fit training, DME instructions, Dry Needling, Electrical stimulation, Spinal manipulation, Spinal mobilization, Cryotherapy, Moist heat, Taping, Traction, Ultrasound, Ionotophoresis 4mg /ml Dexamethasone , Manual therapy, and Re-evaluation.  PLAN FOR NEXT  SESSION: strengthening and mobility exercises focused on R hip pain, review and modify HEP as necessary  Euel Castile D Marquia Costello PT, DPT, GCS  9:18 AM,10/17/23

## 2023-10-18 ENCOUNTER — Ambulatory Visit (INDEPENDENT_AMBULATORY_CARE_PROVIDER_SITE_OTHER)

## 2023-10-18 DIAGNOSIS — I428 Other cardiomyopathies: Secondary | ICD-10-CM | POA: Diagnosis not present

## 2023-10-18 LAB — CUP PACEART REMOTE DEVICE CHECK
Date Time Interrogation Session: 20250604231608
Implantable Pulse Generator Implant Date: 20230928

## 2023-10-30 ENCOUNTER — Ambulatory Visit

## 2023-11-02 ENCOUNTER — Ambulatory Visit

## 2023-11-02 DIAGNOSIS — M6281 Muscle weakness (generalized): Secondary | ICD-10-CM | POA: Diagnosis not present

## 2023-11-02 DIAGNOSIS — M25551 Pain in right hip: Secondary | ICD-10-CM

## 2023-11-02 DIAGNOSIS — M25562 Pain in left knee: Secondary | ICD-10-CM | POA: Diagnosis not present

## 2023-11-02 DIAGNOSIS — G8929 Other chronic pain: Secondary | ICD-10-CM | POA: Diagnosis not present

## 2023-11-02 NOTE — Therapy (Signed)
 OUTPATIENT PHYSICAL THERAPY ANKLE/KNEE/HIP TREATMENT  Patient Name: Brandy [name] MRN: [id number] DOB:11-14-1955, 68 y.o., female Today's Date: 11/02/2023  END OF SESSION:  PT End of Session - 11/02/23 1023     Visit Number 17    Number of Visits 49    Date for PT Re-Evaluation 11/27/23    Authorization Type eval: 06/08/23    PT Start Time 1019    PT Stop Time 1100    PT Time Calculation (min) 41 min    Activity Tolerance Patient tolerated treatment well    Behavior During Therapy [facility] for tasks assessed/performed         Past Medical History:  Diagnosis Date   Acute lower GI bleeding 01/19/2020   Arthritis    Atrial fibrillation with rapid ventricular response (HCC)    Chicken pox    Chronic systolic CHF (congestive heart failure) (HCC)    a. 03/2016 Echo: Ef 15-20%, diff HK, ant AK;  b. 05/2016 Echo: Ef 30-35%, diff HK, mildly dil LA/RA.   Diabetes mellitus without complication (HCC)    Dysrhythmia    GERD (gastroesophageal reflux disease)    Heart murmur    Hip discomfort    been going on for 40 years    History of kidney stones    Hyperlipidemia associated with type 2 diabetes mellitus (HCC) 05/12/2021   Hypertension    Moderate mitral regurgitation    a. 03/2016 Echo: mod MR in setting of LV dysfxn.   Motion sickness    car - back seat   NICM (nonischemic cardiomyopathy) (HCC)    a. 03/2016 Echo: EF 15-20%, diff HK, ant AK, mod MR, mod dil LA, mildly dil RA;  b. 04/2016 Cath: nl cors;  c. 05/2016 Echo: EF 30-35%, diff HK.   Obesity    Obstructive sleep apnea    compliant with CPAP   Persistent atrial fibrillation (HCC)    a. Dx 03/2016;  b. CHA2DS2VASc = 2-->Eliquis  5mg  BID;  b. 05/2016 Failed DCCV x 4.   Sleep apnea    Stomach irritation    Visit for monitoring Tikosyn  therapy 07/24/2016   Past Surgical History:  Procedure Laterality Date   ATRIAL FIBRILLATION ABLATION N/A 12/09/2020   Procedure: ATRIAL FIBRILLATION ABLATION;  Surgeon: Boyce Byes, MD;   Location: [facility] INVASIVE CV LAB;  Service: Cardiovascular;  Laterality: N/A;   BIOPSY N/A 01/12/2020   Procedure: BIOPSY;  Surgeon: Irby Mannan, MD;  Location: [facility];  Service: Endoscopy;  Laterality: N/A;   CARDIAC CATHETERIZATION N/A 04/17/2016   Procedure: Left Heart Cath and Coronary Angiography;  Surgeon: Wenona Hamilton, MD;  Location: [facility] INVASIVE CV LAB;  Service: Cardiovascular;  Laterality: N/A;   CARDIOVERSION N/A 10/22/2020   Procedure: CARDIOVERSION;  Surgeon: Sammy Crisp, MD;  Location: [facility] ORS;  Service: Cardiovascular;  Laterality: N/A;   CHOLECYSTECTOMY     COLON SURGERY  2021   COLONOSCOPY N/A 01/20/2020   Procedure: COLONOSCOPY;  Surgeon: Shane Darling, MD;  Location: [facility] ENDOSCOPY;  Service: Endoscopy;  Laterality: N/A;   COLONOSCOPY WITH PROPOFOL  N/A 01/12/2020   Procedure: COLONOSCOPY WITH PROPOFOL ;  Surgeon: Irby Mannan, MD;  Location: [facility];  Service: Endoscopy;  Laterality: N/A;  Diabetic - oral meds sleep apnea   ELECTROPHYSIOLOGIC STUDY N/A 05/26/2016   Procedure: Cardioversion;  Surgeon: Wenona Hamilton, MD;  Location: [facility] ORS;  Service: Cardiovascular;  Laterality: N/A;   ESOPHAGOGASTRODUODENOSCOPY (EGD) WITH PROPOFOL  N/A 01/12/2020   Procedure: ESOPHAGOGASTRODUODENOSCOPY (EGD) WITH  PROPOFOL ;  Surgeon: Irby Mannan, MD;  Location: [facility]ERY [facility];  Service: Endoscopy;  Laterality: N/A;   POLYPECTOMY N/A 01/12/2020   Procedure: POLYPECTOMY;  Surgeon: Irby Mannan, MD;  Location: [facility] SURGERY [facility];  Service: Endoscopy;  Laterality: N/A;   Patient Active Problem List   Diagnosis Date Noted   Mucous cyst of digit of left hand 05/18/2023   Peroneal tendinitis, left 10/13/2022   Left lumbar radiculopathy 07/26/2022   Arthralgia of left knee 07/26/2022   Pes anserinus bursitis of right knee 04/19/2022   Primary osteoarthritis of right knee 03/27/2022   Greater trochanteric pain syndrome of  right lower extremity 03/27/2022   It band syndrome, right 03/27/2022   Hyperlipidemia associated with type 2 diabetes mellitus (HCC) 05/12/2021   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   Varicose veins of leg with pain, bilateral 07/06/2020   Type II diabetes mellitus with complication (HCC) 01/19/2020   Gastric polyp    Colon polyps    Varicose veins of both lower extremities 10/20/2019   Dysphagia 10/20/2019   Osteoarthritis of right hand 10/31/2016   Nonischemic cardiomyopathy (HCC) 04/14/2016   BMI 33.0-33.9,adult 04/14/2016    PCP: Janna Melter, MD  REFERRING PROVIDER: Rebekah Canada, MD  REFERRING DIAG:  [phone] (ICD-10-CM) - Peroneal tendinitis, left  M54.16 (ICD-10-CM) - Left lumbar radiculopathy  M25.562 (ICD-10-CM) - Arthralgia of left knee  M17.11 (ICD-10-CM) - Primary osteoarthritis of right knee  M76.31 (ICD-10-CM) - It band syndrome, right  M25.551 (ICD-10-CM) - Greater 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   Rationale for Evaluation and Treatment: Rehabilitation  THERAPY DIAG: Muscle weakness (generalized)  Pain in right hip  ONSET DATE: Chronic, aggravated over Christmas (December 2024)  FOLLOW-UP APPT SCHEDULED WITH REFERRING PROVIDER: No, initially no but now has appt for July;  INITIAL EVALUATION: SUBJECTIVE:                                                                                                                                                                                         SUBJECTIVE STATEMENT:  Left ankle and foot pain  PERTINENT HISTORY: Pt reports pain in left ankle that seems to have flared up over Christmas after pt reported excess walking. Pt reports feeling pulling and burning at the base of the lateral ankle. She has previously been to PT for pain in her left hip and knee but has now noticed increased pain in the left ankle/foot. Pt expresses difficulty with exercises; specifically pressure on the  toes, stretching calves, and rocking on toes. Pt notes pain is as high as 5/10 NPS but is constant and never really goes away, with  lowest pain at 1/10 NPS.  PAIN:    Pain Intensity: Present: 1/10, Best: 1/10, Worst: 5/10 Pain location: Lateral foot and ankle Pain Quality: constant and burning  Radiating: Yes  Swelling: No  Popping, catching, locking: No  Numbness/Tingling: No Focal Weakness: Yes Aggravating factors: Touching lateral ankle, moving the ankle, ascending steps when leading with left foot, walking Relieving factors: Stopping activity, resting, brace temporarily relieves some pain 24-hour pain behavior: No night pain when sleeping on back, stretching in AM helps but feels unstable, PM pain depends on activity during the day History of prior back, hip, knee, or ankle injury, pain, surgery, or therapy: Yes; chronic hip, knee, foot pain Dominant hand: right Imaging: No  Typical footwear: Sneakers or rubber boots Red flags: Negative for personal history of cancer, chills/fever, night sweats, nausea, vomiting, unrelenting pain): Negative  PRECAUTIONS: None  WEIGHT BEARING RESTRICTIONS: No  FALLS: Has patient fallen in last 6 months? 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  OBJECTIVE:   Patient Surveys  08/14/23: FADI: FADI Activities Score: 80 / 104 or 77%  Cognition Patient is oriented to person,  place, and time.  Recent memory is intact.  Remote memory is intact.  Attention span and concentration are intact.  Expressive speech is intact.  Patient's fund of knowledge is within normal limits for educational level.    Gross Musculoskeletal Assessment Bulk: Normal Tone: Normal No trophic changes noted to foot/ankle. No ecchymosis, erythema, or edema noted. No gross ankle/foot deformity noted  GAIT: Comments: Mild toe out gait on right foot. Antalgic gait noted on left side.  Posture: No noted discrepancies.  AROM AROM (Normal range in degrees) AROM       Hip Right Left  Flexion (125) WNL WNL  Extension (15) WNL WNL  Abduction (40) WNL WNL  Adduction  WNL WNL  Internal Rotation (45) WNL WNL  External Rotation (45) WNL WNL      Knee    Flexion (135) WNL WNL  Extension (0) WNL WNL      Ankle    Dorsiflexion (20) 10 (WB) 10 (WB)  Plantarflexion (50) 40 35  Inversion (35) 30 20  Eversion (15) 7 8  (* = pain; Blank rows = not tested)   LE MMT: MMT (out of 5) Right  Left   Hip flexion 4 4  Hip extension 4 4  Hip abduction 4 4+  Hip adduction 4+ 4  Hip internal rotation 5 5  Hip external rotation 5 5  Knee flexion 4 4  Knee extension 5 5  Ankle dorsiflexion 5 5  Ankle plantarflexion    Ankle inversion 5 5*  Ankle eversion 5 5*  (* = pain; Blank rows = not tested)  Sensation Diminished sensation left S1 and lateral aspect of left foot.  Reflexes Deferred  Palpation Location LEFT  RIGHT           Plantar fascia 1 0  Dorsal aspect of foot 1 0  Tarsal tunnel 1  0  (Blank rows = not tested) Graded on 0-4 scale (0 = no pain, 1 = pain, 2 = pain with wincing/grimacing/flinching, 3 = pain with withdrawal, 4 = unwilling to allow palpation), (Blank rows = not tested)  Passive Accessory Motion Superior Tibiofibular Joint: WNL Inferior Tibiofibular Joint: WNL Talocrural Joint Distraction: WNL but noted pain with pulling feeling with left foot Talocrural  Joint AP: L hypomobile, painful where hand is purchased Talocrural Joint PA: L hypomobile  SPECIAL TESTS Ligamentous Integrity Anterior Drawer (ATF, 10-15 plantarflexion with anterior translation): Negative Talar Tilt (CFL, inversion): Negative    TODAY'S TREATMENT: 11/02/2023   Subjective: Pt reports no significant L foot pain today upon arrival. She has not contacted podiatry to set up an appointment. She has been having significant L knee pain especially when flexing her knee during stairs ascend/descend. No resting knee pain currently.    PAIN: None reported at rest;    Ther-ex  NuStep L1-4 x 5 minutes for warm-up and BLE strengthening during interval history (3 minutes unbilled); Stair assessment up/down with L knee pain during all single leg stance positions; Supine L quad sets over towel roll 5s hold x 10; Supine SLR hip flexion with 2# ankle weight 2 x 10; Hooklying marching with blue tband around knees 2 x 10 BLE; Hooklying L SAQ over bolster with manual resistance 2 x 10 BLE;  Hooklying clams with blue tband around knees 2 x 10; Hooklying bridges with arms across chest and blue tband around knees 2 x 10; R sidelying straight leg L hip abduction with manual resistance 2 x 10; R sidelying L hip reverse clam with manual resistance 2 x 10;   Not today:  Heel raises with UE support x 15 (stopped due to pain in hamstring); DF in WB 06/13/23: 10 degrees BLE Ankle pumps 2 x 30s BLE; Seated ankle circumduction 2 x 30s each direction BLE; Seated resisted PF with green tband x 20 BLE; Seated resisted inversion with green tband x 20 BLE; Seated resisted eversion with green tband x 20 BLE; Supine isometric DF 3 x 20s BLE; Supine isometric PF 3 x 20s BLE; Ankle DF stretch 3 x 30s BLE; Pball straight knee bridges x 10; Prone L knee hamstring curls with manual resistance from therapist 2 x 10; Prone LLE straight knee hip extension 2 x 10; Prone L hip ER with knee at 90 flexion  and manual resistance from therapist 2 x 10;   PATIENT EDUCATION:  Education details: Pt educated throughout session about proper posture and technique with exercises. Improved exercise technique, movement at target joints, use of target muscles after min to mod verbal, visual, tactile cues.  Person educated: Patient Education method: Explanation, Demonstration, and Tactile cues Education comprehension: verbalized understanding and returned demonstration   HOME EXERCISE PROGRAM:  Access Code: [email or web address] URL: [email or web address] Date: 06/13/2023 Prepared by: Crawford Dock  Exercises - Seated Ankle Eversion with Resistance  - 1 x daily - 7 x weekly - 2 sets - 10 reps - 3s hold - Seated Ankle Inversion with Resistance and Legs Crossed (Mirrored)  - 1 x daily - 7 x weekly - 2 sets - 10 reps - 3s hold - Seated Ankle Plantarflexion with Resistance  - 1 x daily - 7 x weekly - 3    sets - 10 reps - 3s hold - Long Sitting Calf Stretch with Strap  - 1 x daily - 7 x weekly - 3 reps - 30-45s hold - Standing Gastroc Stretch on  Step  - 1 x daily - 7 x weekly - 3 sets - 10 reps - Standing Dorsiflexion Self-Mobilization on Step  - 1 x daily - 7 x weekly - 3 sets - 10 reps - Standing Dorsiflexion Self-Mobilization on Step (Mirrored)  - 1 x daily - 7 x weekly - 3 sets - 10 reps - Standing on Blue foam x 30 secs with EC -heel raises on Blue foam 20 reps   ASSESSMENT:  CLINICAL IMPRESSION: Increase in L knee pain since last session. Progressed strengthening during session to focus on quads, hamstrings, and glutes. No increase in hip or knee pain reported. Pt encouraged to follow-up as scheduled. She will benefit from PT services to address deficits in strength and pain in order to return to full function at home and with leisure activities.   OBJECTIVE IMPAIRMENTS: decreased strength and hypomobility.   ACTIVITY LIMITATIONS: stairs and locomotion level  PARTICIPATION LIMITATIONS:  occupation and yard work  PERSONAL FACTORS: Age, Behavior pattern, Past/current experiences, Time since onset of injury/illness/exacerbation, and 1-2 comorbidities: type II diabetes and left lumbar radiculopathy are also affecting patient's functional outcome.   REHAB POTENTIAL: Good  CLINICAL DECISION MAKING: Evolving/moderate complexity  EVALUATION COMPLEXITY: Moderate   GOALS: Goals reviewed with patient? Yes  SHORT TERM GOALS: Target date:   Pt will be independent with HEP to improve strength and decrease ankle pain to improve pain-free function at home and work. Baseline:  Goal status: ACHIEVED   LONG TERM GOALS: Target date: 11/27/23  1.  Pt will decrease worst ankle pain by at least 3 points on the NPRS in order to demonstrate clinically significant reduction in ankle pain. Baseline: 06/08/23: 1/10 best, 5/10 worst; 08/14/23: worst: 1/10; Goal status: ACHIEVED  2.  Pt will increase FADI score by at least 4 points in order demonstrate clinically significant reduction in ankle pain/disability.       Baseline: 06/13/23: 65%; 08/14/23: 80 / 104 or 77% Goal status: ACHIEVED  3.  Pt will improve strength of ankle inversion/eversion with limited to no pain in order to demonstrate improvement in strength and function.  Baseline: 06/08/23: 5/5 with pain; 08/14/23: 5/5 without pain Goal status: ACHIEVED  4.  Pt will report at least 50% improvement in L ankle and knee symptoms in order to demonstrate improvement in her ability to work in the yard/garden with less pain.  Baseline: 08/14/23: 75% improvement Goal status: ACHIEVED  5.  Pt will decrease HAGOS score by at least 20 points in order demonstrate clinically significant reduction in hip pain/disability.  Baseline: 08/14/23: HAGOS Score: 37.7% (Symptoms Score: 50%; Pain Score: 37.5%; Physical Function, Daily Living Score: 45%; Function, Sports, and Recreational Activities Score: 43.7%; Participation in Physical Activities Score: 25%;  Quality of Life Score: 25 %) Goal status: INITIAL  6.  Pt will decrease worst posterior R hip pain by at least 3 points on the NPRS in order to demonstrate clinically significant reduction in hip pain. Baseline: 08/14/23: worst: 8/10; Goal status: INITIAL   PLAN: PT FREQUENCY: 1-2x/week  PT DURATION: 12 weeks  PLANNED INTERVENTIONS: Therapeutic exercises, Therapeutic activity, Neuromuscular re-education, Balance training, Gait training, Patient/Family education, Self Care, Joint mobilization, Joint manipulation, Vestibular training, Canalith repositioning, Orthotic/Fit training, DME instructions, Dry Needling, Electrical stimulation, Spinal manipulation, Spinal mobilization, Cryotherapy, Moist heat, Taping, Traction, Ultrasound, Ionotophoresis 4mg /ml Dexamethasone , Manual therapy, and Re-evaluation.  PLAN FOR NEXT SESSION: strengthening and mobility exercises focused on R hip pain, review and modify HEP as necessary  Redford Behrle D Rhiannon Sassaman PT,  DPT, GCS  11:23 AM,11/02/23

## 2023-11-05 NOTE — Progress Notes (Signed)
 [facility] Summary Report / Loop Recorder

## 2023-11-13 ENCOUNTER — Encounter

## 2023-11-15 ENCOUNTER — Other Ambulatory Visit: Payer: Self-pay | Admitting: Internal Medicine

## 2023-11-15 DIAGNOSIS — E118 Type 2 diabetes mellitus with unspecified complications: Secondary | ICD-10-CM

## 2023-11-19 ENCOUNTER — Ambulatory Visit: Payer: Self-pay | Admitting: Cardiology

## 2023-11-19 ENCOUNTER — Ambulatory Visit: Payer: Self-pay | Admitting: Family Medicine

## 2023-11-19 ENCOUNTER — Ambulatory Visit

## 2023-11-19 DIAGNOSIS — I428 Other cardiomyopathies: Secondary | ICD-10-CM | POA: Diagnosis not present

## 2023-11-19 LAB — CUP PACEART REMOTE DEVICE CHECK
Date Time Interrogation Session: 20250706232557
Implantable Pulse Generator Implant Date: 20230928

## 2023-11-19 NOTE — Telephone Encounter (Signed)
 Requested medication (s) are due for refill today: na   Requested medication (s) are on the active medication list: yes   Last refill:  05/18/23 #90 1 refills  Future visit scheduled: yes with Dr. Alvia   Notes to clinic:  do you want to refill for 1 year?     Requested Prescriptions  Pending Prescriptions Disp Refills   FARXIGA  10 MG TABS tablet [Pharmacy Med Name: FARXIGA  TABS 10MG ] 90 tablet 3    Sig: TAKE 1 TABLET DAILY BEFORE BREAKFAST     Endocrinology:  Diabetes - SGLT2 Inhibitors Passed - 11/19/2023  4:26 PM      Passed - Cr in normal range and within 360 days    Creatinine  Date Value Ref Range Status  07/31/2011 1.04 0.60 - 1.30 mg/dL Final   Creatinine, Ser  Date Value Ref Range Status  05/18/2023 0.91 0.57 - 1.00 mg/dL Final         Passed - HBA1C is between 0 and 7.9 and within 180 days    Hemoglobin A1C  Date Value Ref Range Status  09/14/2023 6.1 (A) 4.0 - 5.6 % Final   Hgb A1c MFr Bld  Date Value Ref Range Status  05/18/2023 5.9 (H) 4.8 - 5.6 % Final    Comment:             Prediabetes: 5.7 - 6.4          Diabetes: >6.4          Glycemic control for adults with diabetes: <7.0          Passed - eGFR in normal range and within 360 days    EGFR (African American)  Date Value Ref Range Status  07/31/2011 >60 >69mL/min Final   GFR calc Af Amer  Date Value Ref Range Status  01/24/2020 >60 >60 mL/min Final   EGFR (Non-African Amer.)  Date Value Ref Range Status  07/31/2011 58 (L) >31mL/min Final    Comment:    eGFR values <64mL/min/1.73 m2 may be an indication of chronic kidney disease (CKD). Calculated eGFR, using the MRDR Study equation, is useful in  patients with stable renal function. The eGFR calculation will not be reliable in acutely ill patients when serum creatinine is changing rapidly. It is not useful in patients on dialysis. The eGFR calculation may not be applicable to patients at the low and high extremes of body sizes,  pregnant women, and vegetarians.    GFR, Estimated  Date Value Ref Range Status  02/12/2023 >60 >60 mL/min Final    Comment:    (NOTE) Calculated using the CKD-EPI Creatinine Equation (2021)    eGFR  Date Value Ref Range Status  05/18/2023 69 >59 mL/min/1.73 Final         Passed - Valid encounter within last 6 months    Recent Outpatient Visits           2 months ago Type II diabetes mellitus with complication [facility])   [facility] at [facility], Leita DEL, MD       Future Appointments             In 1 week Alvia, Selinda PARAS, MD [facility] at [facility], [facility]

## 2023-11-20 ENCOUNTER — Ambulatory Visit: Attending: Family Medicine

## 2023-11-20 DIAGNOSIS — G8929 Other chronic pain: Secondary | ICD-10-CM | POA: Diagnosis not present

## 2023-11-20 DIAGNOSIS — M25551 Pain in right hip: Secondary | ICD-10-CM | POA: Insufficient documentation

## 2023-11-20 DIAGNOSIS — M6281 Muscle weakness (generalized): Secondary | ICD-10-CM | POA: Diagnosis not present

## 2023-11-20 DIAGNOSIS — M25562 Pain in left knee: Secondary | ICD-10-CM | POA: Insufficient documentation

## 2023-11-20 NOTE — Therapy (Signed)
 OUTPATIENT PHYSICAL THERAPY ANKLE/KNEE/HIP TREATMENT  Patient Name: Brandy [name] MRN: [id number] DOB:02/17/1956, 68 y.o., female Today's Date: 11/20/2023  END OF SESSION:  PT End of Session - 11/20/23 1533     Visit Number 18    Number of Visits 49    Date for PT Re-Evaluation 11/27/23    Authorization Type eval: 06/08/23    PT Start Time 1535    PT Stop Time 1618    PT Time Calculation (min) 43 min    Activity Tolerance Patient tolerated treatment well    Behavior During Therapy [facility] for tasks assessed/performed         Past Medical History:  Diagnosis Date   Acute lower GI bleeding 01/19/2020   Arthritis    Atrial fibrillation with rapid ventricular response (HCC)    Chicken pox    Chronic systolic CHF (congestive heart failure) (HCC)    a. 03/2016 Echo: Ef 15-20%, diff HK, ant AK;  b. 05/2016 Echo: Ef 30-35%, diff HK, mildly dil LA/RA.   Diabetes mellitus without complication (HCC)    Dysrhythmia    GERD (gastroesophageal reflux disease)    Heart murmur    Hip discomfort    been going on for 40 years    History of kidney stones    Hyperlipidemia associated with type 2 diabetes mellitus (HCC) 05/12/2021   Hypertension    Moderate mitral regurgitation    a. 03/2016 Echo: mod MR in setting of LV dysfxn.   Motion sickness    car - back seat   NICM (nonischemic cardiomyopathy) (HCC)    a. 03/2016 Echo: EF 15-20%, diff HK, ant AK, mod MR, mod dil LA, mildly dil RA;  b. 04/2016 Cath: nl cors;  c. 05/2016 Echo: EF 30-35%, diff HK.   Obesity    Obstructive sleep apnea    compliant with CPAP   Persistent atrial fibrillation (HCC)    a. Dx 03/2016;  b. CHA2DS2VASc = 2-->Eliquis  5mg  BID;  b. 05/2016 Failed DCCV x 4.   Sleep apnea    Stomach irritation    Visit for monitoring Tikosyn  therapy 07/24/2016   Past Surgical History:  Procedure Laterality Date   ATRIAL FIBRILLATION ABLATION N/A 12/09/2020   Procedure: ATRIAL FIBRILLATION ABLATION;  Surgeon: Cindie Ole ONEIDA, MD;   Location: [facility];  Service: Cardiovascular;  Laterality: N/A;   BIOPSY N/A 01/12/2020   Procedure: BIOPSY;  Surgeon: Janalyn Keene NOVAK, MD;  Location: [facility];  Service: Endoscopy;  Laterality: N/A;   CARDIAC CATHETERIZATION N/A 04/17/2016   Procedure: Left Heart Cath and Coronary Angiography;  Surgeon: Deatrice DELENA Cage, MD;  Location: [facility] INV[facility] CV [facility];  Service: Cardiovascular;  Laterality: N/A;   CARDIOVERSION N/A 10/22/2020   Procedure: CARDIOVERSION;  Surgeon: Mady Bruckner, MD;  Location: [facility] ORS;  Service: Cardiovascular;  Laterality: N/A;   CHOLECYSTECTOMY     COLON SURGERY  2021   COLONOSCOPY N/A 01/20/2020   Procedure: COLONOSCOPY;  Surgeon: Maryruth Ole ONEIDA, MD;  Location: [facility] END[facility];  Service: Endoscopy;  Laterality: N/A;   COLONOSCOPY WITH PROPOFOL  N/A 01/12/2020   Procedure: COLONOSCOPY WITH PROPOFOL ;  Surgeon: Janalyn Keene NOVAK, MD;  Location: [facility];  Service: Endoscopy;  Laterality: N/A;  Diabetic - oral meds sleep apnea   ELECTROPHYSIOLOGIC STUDY N/A 05/26/2016   Procedure: Cardioversion;  Surgeon: Deatrice DELENA Cage, MD;  Location: [facility];  Service: Cardiovascular;  Laterality: N/A;   ESOPHAGOGASTRODUODENOSCOPY (EGD) WITH PROPOFOL  N/A 01/12/2020   Procedure: ESOPHAGOGASTRODUODENOSCOPY (EGD) WITH  PROPOFOL ;  Surgeon: Janalyn Keene NOVAK, MD;  Location: [facility];  Service: Endoscopy;  Laterality: N/A;   POLYPECTOMY N/A 01/12/2020   Procedure: POLYPECTOMY;  Surgeon: Janalyn Keene NOVAK, MD;  Location: [facility];  Service: Endoscopy;  Laterality: N/A;   Patient Active Problem List   Diagnosis Date Noted   Mucous cyst of digit of left hand 05/18/2023   Peroneal tendinitis, left 10/13/2022   Left lumbar radiculopathy 07/26/2022   Arthralgia of left knee 07/26/2022   Pes anserinus bursitis of right knee 04/19/2022   Primary osteoarthritis of right knee 03/27/2022   Greater trochanteric pain syndrome of  right lower extremity 03/27/2022   It band syndrome, right 03/27/2022   Hyperlipidemia associated with type 2 diabetes mellitus (HCC) 05/12/2021   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   Varicose veins of leg with pain, bilateral 07/06/2020   Type II diabetes mellitus with complication (HCC) 01/19/2020   Gastric polyp    Colon polyps    Varicose veins of both lower extremities 10/20/2019   Dysphagia 10/20/2019   Osteoarthritis of right hand 10/31/2016   Nonischemic cardiomyopathy (HCC) 04/14/2016   BMI 33.0-33.9,adult 04/14/2016    PCP: Leita Adie, MD  REFERRING PROVIDER: Selinda Ku, MD  REFERRING DIAG:  [phone] (ICD-10-CM) - Peroneal tendinitis, left  M54.16 (ICD-10-CM) - Left lumbar radiculopathy  M25.562 (ICD-10-CM) - Arthralgia of left knee  M17.11 (ICD-10-CM) - Primary osteoarthritis of right knee  M76.31 (ICD-10-CM) - It band syndrome, right  M25.551 (ICD-10-CM) - Greater 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   Rationale for Evaluation and Treatment: Rehabilitation  THERAPY DIAG: Muscle weakness (generalized)  Pain in right hip  Chronic pain of left knee  ONSET DATE: Chronic, aggravated over Christmas (December 2024)  FOLLOW-UP APPT SCHEDULED WITH REFERRING PROVIDER: No, initially no but now has appt for July;  INITIAL EVALUATION: SUBJECTIVE:                                                                                                                                                                                         SUBJECTIVE STATEMENT:  Left ankle and foot pain  PERTINENT HISTORY: Pt reports pain in left ankle that seems to have flared up over Christmas after pt reported excess walking. Pt reports feeling pulling and burning at the base of the lateral ankle. She has previously been to PT for pain in her left hip and knee but has now noticed increased pain in the left ankle/foot. Pt expresses difficulty with exercises;  specifically pressure on the toes, stretching calves, and rocking on toes. Pt notes pain is as high as 5/10 NPS but is constant  and never really goes away, with lowest pain at 1/10 NPS.  PAIN:    Pain Intensity: Present: 1/10, Best: 1/10, Worst: 5/10 Pain location: Lateral foot and ankle Pain Quality: constant and burning  Radiating: Yes  Swelling: No  Popping, catching, locking: No  Numbness/Tingling: No Focal Weakness: Yes Aggravating factors: Touching lateral ankle, moving the ankle, ascending steps when leading with left foot, walking Relieving factors: Stopping activity, resting, brace temporarily relieves some pain 24-hour pain behavior: No night pain when sleeping on back, stretching in AM helps but feels unstable, PM pain depends on activity during the day History of prior back, hip, knee, or ankle injury, pain, surgery, or therapy: Yes; chronic hip, knee, foot pain Dominant hand: right Imaging: No  Typical footwear: Sneakers or rubber boots Red flags: Negative for personal history of cancer, chills/fever, night sweats, nausea, vomiting, unrelenting pain): Negative  PRECAUTIONS: None  WEIGHT BEARING RESTRICTIONS: No  FALLS: Has patient fallen in last 6 months? 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  OBJECTIVE:   Patient Surveys  08/14/23: FADI: FADI Activities Score: 80 / 104 or 77%  Cognition Patient  is oriented to person, place, and time.  Recent memory is intact.  Remote memory is intact.  Attention span and concentration are intact.  Expressive speech is intact.  Patient's fund of knowledge is within normal limits for educational level.    Gross Musculoskeletal Assessment Bulk: Normal Tone: Normal No trophic changes noted to foot/ankle. No ecchymosis, erythema, or edema noted. No gross ankle/foot deformity noted  GAIT: Comments: Mild toe out gait on right foot. Antalgic gait noted on left side.  Posture: No noted discrepancies.  AROM AROM (Normal range in degrees) AROM       Hip Right Left  Flexion (125) WNL WNL  Extension (15) WNL WNL  Abduction (40) WNL WNL  Adduction  WNL WNL  Internal Rotation (45) WNL WNL  External Rotation (45) WNL WNL      Knee    Flexion (135) WNL WNL  Extension (0) WNL WNL      Ankle    Dorsiflexion (20) 10 (WB) 10 (WB)  Plantarflexion (50) 40 35  Inversion (35) 30 20  Eversion (15) 7 8  (* = pain; Blank rows = not tested)   LE MMT: MMT (out of 5) Right  Left   Hip flexion 4 4  Hip extension 4 4  Hip abduction 4 4+  Hip adduction 4+ 4  Hip internal rotation 5 5  Hip external rotation 5 5  Knee flexion 4 4  Knee extension 5 5  Ankle dorsiflexion 5 5  Ankle plantarflexion    Ankle inversion 5 5*  Ankle eversion 5 5*  (* = pain; Blank rows = not tested)  Sensation Diminished sensation left S1 and lateral aspect of left foot.  Reflexes Deferred  Palpation Location LEFT  RIGHT           Plantar fascia 1 0  Dorsal aspect of foot  1 0  Tarsal tunnel 1 0  (Blank rows = not tested) Graded on 0-4 scale (0 = no pain, 1 = pain, 2 = pain with wincing/grimacing/flinching, 3 = pain with withdrawal, 4 = unwilling to allow palpation), (Blank rows = not tested)  Passive Accessory Motion Superior Tibiofibular Joint: WNL Inferior Tibiofibular Joint: WNL Talocrural Joint Distraction: WNL but noted pain with pulling feeling with  left foot Talocrural Joint AP: L hypomobile, painful where hand is purchased Talocrural Joint PA: L hypomobile  SPECIAL TESTS Ligamentous Integrity Anterior Drawer (ATF, 10-15 plantarflexion with anterior translation): Negative Talar Tilt (CFL, inversion): Negative    TODAY'S TREATMENT: 11/20/2023   Subjective: Pt reports ongoing L foot pain today upon arrival. Her L knee has been painful recently and feels unstable. She is also reporting some R hip pain upon arrival today. She has not contacted podiatry to set up an appointment yet.    PAIN: 1/10 L knee pain, 3/10 R hip pain;   Ther-ex  NuStep L1-4 x 10 minutes for warm-up and BLE strengthening during interval history (5 minutes unbilled); ACL/PCL/MCL/LCL ligamentous testing WNL; Supine L quad sets over towel roll 5s hold x 10; Supine SLR hip flexion with 3# ankle weight x 10; Attempted hooklying L SAQ over bolster with manual resistance but DC secondary to pain; Hooklying clams with manual resistance x 10; Hooklying adductor squeezes with manual resistance x 10; Hooklying bridges with arms at side x 10; R sidelying straight leg L hip abduction with manual resistance 2 x 10; R sidelying L hip reverse clam with manual resistance 2 x 10; Seated L hamstring curls with blue tband 2 x 10;   Manual Therapy  STM to R lateral/posterior hip and thigh with Theraband Roller x 5 minutes;   Not today:  Heel raises with UE support x 15 (stopped due to pain in hamstring); DF in WB 06/13/23: 10 degrees BLE Ankle pumps 2 x 30s BLE; Seated ankle circumduction 2 x 30s each direction BLE; Seated resisted PF with green tband x 20 BLE; Seated resisted inversion with green tband x 20 BLE; Seated resisted eversion with green tband x 20 BLE; Supine isometric DF 3 x 20s BLE; Supine isometric PF 3 x 20s BLE; Ankle DF stretch 3 x 30s BLE; Pball straight knee bridges x 10; Prone L knee hamstring curls with manual resistance from therapist 2 x  10; Prone LLE straight knee hip extension 2 x 10; Prone L hip ER with knee at 90 flexion and manual resistance from therapist 2 x 10;   PATIENT EDUCATION:  Education details: Pt educated throughout session about proper posture and technique with exercises. Improved exercise technique, movement at target joints, use of target muscles after min to mod verbal, visual, tactile cues.  Person educated: Patient Education method: Explanation, Demonstration, and Tactile cues Education comprehension: verbalized understanding and returned demonstration   HOME EXERCISE PROGRAM:  Access Code: [email or web address] URL: [email or web address] Date: 06/13/2023 Prepared by: Selinda Eck  Exercises - Seated Ankle Eversion with Resistance  - 1 x daily - 7 x weekly - 2 sets - 10 reps - 3s hold - Seated Ankle Inversion with Resistance and Legs Crossed (Mirrored)  - 1 x daily - 7 x weekly - 2 sets - 10 reps - 3s hold - Seated Ankle Plantarflexion with Resistance  - 1 x daily - 7 x weekly - 3    sets - 10 reps - 3s hold - Long Sitting Calf Stretch with Strap  - 1 x  daily - 7 x weekly - 3 reps - 30-45s hold - Standing Gastroc Stretch on Step  - 1 x daily - 7 x weekly - 3 sets - 10 reps - Standing Dorsiflexion Self-Mobilization on Step  - 1 x daily - 7 x weekly - 3 sets - 10 reps - Standing Dorsiflexion Self-Mobilization on Step (Mirrored)  - 1 x daily - 7 x weekly - 3 sets - 10 reps - Standing on Blue foam x 30 secs with EC -heel raises on Blue foam 20 reps   ASSESSMENT:  CLINICAL IMPRESSION: Ongoing L knee and R hip pain today. Progressed strengthening during session to focus on quads, hamstrings, and glutes. No increase in hip or knee pain reported except during SAQ which were discontinued. Utilized STM to R lateral/posterior hip and thigh. Plan to update outcome measures and goals at next visit. Pt encouraged to follow-up as scheduled. She will benefit from PT services to address deficits in strength  and pain in order to return to full function at home and with leisure activities.   OBJECTIVE IMPAIRMENTS: decreased strength and hypomobility.   ACTIVITY LIMITATIONS: stairs and locomotion level  PARTICIPATION LIMITATIONS: occupation and yard work  PERSONAL FACTORS: Age, Behavior pattern, Past/current experiences, Time since onset of injury/illness/exacerbation, and 1-2 comorbidities: type II diabetes and left lumbar radiculopathy are also affecting patient's functional outcome.   REHAB POTENTIAL: Good  CLINICAL DECISION MAKING: Evolving/moderate complexity  EVALUATION COMPLEXITY: Moderate   GOALS: Goals reviewed with patient? Yes  SHORT TERM GOALS: Target date:   Pt will be independent with HEP to improve strength and decrease ankle pain to improve pain-free function at home and work. Baseline:  Goal status: ACHIEVED   LONG TERM GOALS: Target date: 11/27/23  1.  Pt will decrease worst ankle pain by at least 3 points on the NPRS in order to demonstrate clinically significant reduction in ankle pain. Baseline: 06/08/23: 1/10 best, 5/10 worst; 08/14/23: worst: 1/10; Goal status: ACHIEVED  2.  Pt will increase FADI score by at least 4 points in order demonstrate clinically significant reduction in ankle pain/disability.       Baseline: 06/13/23: 65%; 08/14/23: 80 / 104 or 77% Goal status: ACHIEVED  3.  Pt will improve strength of ankle inversion/eversion with limited to no pain in order to demonstrate improvement in strength and function.  Baseline: 06/08/23: 5/5 with pain; 08/14/23: 5/5 without pain Goal status: ACHIEVED  4.  Pt will report at least 50% improvement in L ankle and knee symptoms in order to demonstrate improvement in her ability to work in the yard/garden with less pain.  Baseline: 08/14/23: 75% improvement Goal status: ACHIEVED  5.  Pt will decrease HAGOS score by at least 20 points in order demonstrate clinically significant reduction in hip pain/disability.   Baseline: 08/14/23: HAGOS Score: 37.7% (Symptoms Score: 50%; Pain Score: 37.5%; Physical Function, Daily Living Score: 45%; Function, Sports, and Recreational Activities Score: 43.7%; Participation in Physical Activities Score: 25%; Quality of Life Score: 25 %) Goal status: INITIAL  6.  Pt will decrease worst posterior R hip pain by at least 3 points on the NPRS in order to demonstrate clinically significant reduction in hip pain. Baseline: 08/14/23: worst: 8/10; Goal status: INITIAL   PLAN: PT FREQUENCY: 1-2x/week  PT DURATION: 12 weeks  PLANNED INTERVENTIONS: Therapeutic exercises, Therapeutic activity, Neuromuscular re-education, Balance training, Gait training, Patient/Family education, Self Care, Joint mobilization, Joint manipulation, Vestibular training, Canalith repositioning, Orthotic/Fit training, DME instructions, Dry Needling, Electrical stimulation, Spinal manipulation,  Spinal mobilization, Cryotherapy, Moist heat, Taping, Traction, Ultrasound, Ionotophoresis 4mg /ml Dexamethasone , Manual therapy, and Re-evaluation.  PLAN FOR NEXT SESSION: Update outcome measures/goals, strengthening and mobility exercises focused on R hip pain, review and modify HEP as necessary  Cherrish Vitali D Truman Aceituno PT, DPT, GCS  5:33 PM,11/20/23

## 2023-11-26 ENCOUNTER — Ambulatory Visit (INDEPENDENT_AMBULATORY_CARE_PROVIDER_SITE_OTHER): Admitting: Family Medicine

## 2023-11-26 VITALS — BP 110/80 | HR 72 | Ht 67.0 in | Wt 232.6 lb

## 2023-11-26 DIAGNOSIS — M25552 Pain in left hip: Secondary | ICD-10-CM | POA: Diagnosis not present

## 2023-11-26 DIAGNOSIS — M170 Bilateral primary osteoarthritis of knee: Secondary | ICD-10-CM | POA: Diagnosis not present

## 2023-11-26 DIAGNOSIS — M25551 Pain in right hip: Secondary | ICD-10-CM

## 2023-11-26 NOTE — Assessment & Plan Note (Signed)
 Bilateral hip pain - Bilateral hip pain, located in the lower back part of the hip - Pain exacerbated by sitting and ascending stairs - Prolonged sitting aggravates the pain - Pain particularly severe today - Difficulty sleeping on their side due to hip pain, resulting in disrupted sleep  Physical Exam PALPATION: Minimal tenderness at the base of the fifth metatarsal on the left. No crepitus. Minor tenderness at the right sacroiliac joint and right greater trochanter maximally. Tenderness at the left greater trochanter. SPECIAL TESTS: Modified seated straight leg raise benign on the left.  Greater trochanteric pain syndrome Greater trochanteric pain syndrome with tenderness at the right SI joint and bilateral greater trochanteric regions, affecting sleep and relieved by physical therapy. - Schedule bilateral hip bursa injection with cortisone under ultrasound guidance. - Use ice and ibuprofen post-injection. - Increase physical therapy sessions post-injection.

## 2023-11-29 NOTE — Patient Instructions (Signed)
 Patient Plan:  1. Hip Pain Management:    - Schedule cortisone injections for both hips under ultrasound guidance.    - Use ice and take ibuprofen after the injections.    - Increase physical therapy sessions following the injections.  2. Knee Pain Management:    - Consider a cortisone injection for the left knee if hip injections do not relieve pain. We will follow-up on your knees at the next visit.    - Consider alternative treatments like lubricant injections with your healthcare provider.  3. Monitoring:    - Contact your healthcare provider if knee pain becomes unmanageable.  4. Red Flags:    - Report any significant increase in pain or new symptoms immediately.

## 2023-11-29 NOTE — Assessment & Plan Note (Signed)
 Bilateral knee pain - Bilateral knee pain, with the left knee more problematic - Sharp pain, often occurring when ascending stairs - History of left knee pain with prior x-rays performed - Specific physical therapy exercises help ease the pain  RADIOLOGY Right knee X-ray: Mild degenerative changes at the patellofemoral joint (02/2022) Left knee X-ray: Severe degenerative changes at the patellofemoral joint with narrowed tricompartment space (07/2022)  Osteoarthritis of left knee Osteoarthritis of the left knee with severe wear and tear, particularly at the patellofemoral articulation, confirmed by previous x-rays. - Consider cortisone injection for the left knee if pain persists after hip injections. - Discuss alternative treatment options such as lubricant injections. - Contact if knee pain becomes unmanageable.

## 2023-11-29 NOTE — Therapy (Incomplete)
 OUTPATIENT PHYSICAL THERAPY ANKLE/KNEE/HIP TREATMENT/RECERTIFICATION  Patient Name: Brandy [name] MRN: [id number] DOB:04/17/1956, 68 y.o., female Today's Date: 11/29/2023  END OF SESSION:   Past Medical History:  Diagnosis Date   Acute lower GI bleeding 01/19/2020   Arthritis    Atrial fibrillation with rapid ventricular response (HCC)    Chicken pox    Chronic systolic CHF (congestive heart failure) (HCC)    a. 03/2016 Echo: Ef 15-20%, diff HK, ant AK;  b. 05/2016 Echo: Ef 30-35%, diff HK, mildly dil LA/RA.   Diabetes mellitus without complication (HCC)    Dysrhythmia    GERD (gastroesophageal reflux disease)    Heart murmur    Hip discomfort    been going on for 40 years    History of kidney stones    Hyperlipidemia associated with type 2 diabetes mellitus (HCC) 05/12/2021   Hypertension    Moderate mitral regurgitation    a. 03/2016 Echo: mod MR in setting of LV dysfxn.   Motion sickness    car - back seat   NICM (nonischemic cardiomyopathy) (HCC)    a. 03/2016 Echo: EF 15-20%, diff HK, ant AK, mod MR, mod dil LA, mildly dil RA;  b. 04/2016 Cath: nl cors;  c. 05/2016 Echo: EF 30-35%, diff HK.   Obesity    Obstructive sleep apnea    compliant with CPAP   Persistent atrial fibrillation (HCC)    a. Dx 03/2016;  b. CHA2DS2VASc = 2-->Eliquis  5mg  BID;  b. 05/2016 Failed DCCV x 4.   Sleep apnea    Stomach irritation    Visit for monitoring Tikosyn  therapy 07/24/2016   Past Surgical History:  Procedure Laterality Date   ATRIAL FIBRILLATION ABLATION N/A 12/09/2020   Procedure: ATRIAL FIBRILLATION ABLATION;  Surgeon: Cindie Ole ONEIDA, MD;  Location: [facility];  Service: Cardiovascular;  Laterality: N/A;   BIOPSY N/A 01/12/2020   Procedure: BIOPSY;  Surgeon: Janalyn Keene NOVAK, MD;  Location: [facility];  Service: Endoscopy;  Laterality: N/A;   CARDIAC CATHETERIZATION N/A 04/17/2016   Procedure: Left Heart Cath and Coronary Angiography;  Surgeon: Deatrice DELENA Cage,  MD;  Location: [facility] CV [facility];  Service: Cardiovascular;  Laterality: N/A;   CARDIOVERSION N/A 10/22/2020   Procedure: CARDIOVERSION;  Surgeon: Mady Bruckner, MD;  Location: [facility] ORS;  Service: Cardiovascular;  Laterality: N/A;   CHOLECYSTECTOMY     COLON SURGERY  2021   COLONOSCOPY N/A 01/20/2020   Procedure: COLONOSCOPY;  Surgeon: Maryruth Ole ONEIDA, MD;  Location: [facility] ENDOS[facility];  Service: Endoscopy;  Laterality: N/A;   COLONOSCOPY WITH PROPOFOL  N/A 01/12/2020   Procedure: COLONOSCOPY WITH PROPOFOL ;  Surgeon: Janalyn Keene NOVAK, MD;  Location: [facility];  Service: Endoscopy;  Laterality: N/A;  Diabetic - oral meds sleep apnea   ELECTROPHYSIOLOGIC STUDY N/A 05/26/2016   Procedure: Cardioversion;  Surgeon: Deatrice DELENA Cage, MD;  Location: [facility] OR[facility];  Service: Cardiovascular;  Laterality: N/A;   ESOPHAGOGASTRODUODENOSCOPY (EGD) WITH PROPOFOL  N/A 01/12/2020   Procedure: ESOPHAGOGASTRODUODENOSCOPY (EGD) WITH PROPOFOL ;  Surgeon: Janalyn Keene NOVAK, MD;  Location: [facility];  Service: Endoscopy;  Laterality: N/A;   POLYPECTOMY N/A 01/12/2020   Procedure: POLYPECTOMY;  Surgeon: Janalyn Keene NOVAK, MD;  Location: [facility];  Service: Endoscopy;  Laterality: N/A;   Patient Active Problem List   Diagnosis Date Noted   Mucous cyst of digit of left hand 05/18/2023   Peroneal tendinitis, left 10/13/2022   Left lumbar radiculopathy 07/26/2022   Arthralgia of left knee 07/26/2022  Pes anserinus bursitis of right knee 04/19/2022   Osteoarthritis of both knees 03/27/2022   Greater trochanteric pain syndrome of both lower extremities 03/27/2022   It band syndrome, right 03/27/2022   Hyperlipidemia associated with type 2 diabetes mellitus (HCC) 05/12/2021   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   Varicose veins of leg with pain, bilateral 07/06/2020   Type II diabetes mellitus with complication (HCC) 01/19/2020   Gastric polyp    Colon polyps     Varicose veins of both lower extremities 10/20/2019   Dysphagia 10/20/2019   Osteoarthritis of right hand 10/31/2016   Nonischemic cardiomyopathy (HCC) 04/14/2016   BMI 33.0-33.9,adult 04/14/2016    PCP: Leita Adie, MD  REFERRING PROVIDER: Selinda Ku, MD  REFERRING DIAG:  [phone] (ICD-10-CM) - Peroneal tendinitis, left  M54.16 (ICD-10-CM) - Left lumbar radiculopathy  M25.562 (ICD-10-CM) - Arthralgia of left knee  M17.11 (ICD-10-CM) - Primary osteoarthritis of right knee  M76.31 (ICD-10-CM) - It band syndrome, right  M25.551 (ICD-10-CM) - Greater 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   Rationale for Evaluation and Treatment: Rehabilitation  THERAPY DIAG: Muscle weakness (generalized)  Pain in right hip  Chronic pain of left knee  ONSET DATE: Chronic, aggravated over Christmas (December 2024)  FOLLOW-UP APPT SCHEDULED WITH REFERRING PROVIDER: No, initially no but now has appt for July;  INITIAL EVALUATION: SUBJECTIVE:                                                                                                                                                                                         SUBJECTIVE STATEMENT:  Left ankle and foot pain  PERTINENT HISTORY: Pt reports pain in left ankle that seems to have flared up over Christmas after pt reported excess walking. Pt reports feeling pulling and burning at the base of the lateral ankle. She has previously been to PT for pain in her left hip and knee but has now noticed increased pain in the left ankle/foot. Pt expresses difficulty with exercises; specifically pressure on the toes, stretching calves, and rocking on toes. Pt notes pain is as high as 5/10 NPS but is constant and never really goes away, with lowest pain at 1/10 NPS.  PAIN:    Pain Intensity: Present: 1/10, Best: 1/10, Worst: 5/10 Pain location: Lateral foot and ankle Pain Quality: constant and burning  Radiating: Yes  Swelling: No   Popping, catching, locking: No  Numbness/Tingling: No Focal Weakness: Yes Aggravating factors: Touching lateral ankle, moving the ankle, ascending steps when leading with left foot, walking Relieving factors: Stopping activity, resting, brace temporarily relieves some pain 24-hour pain behavior: No night pain when sleeping on  back, stretching in AM helps but feels unstable, PM pain depends on activity during the day History of prior back, hip, knee, or ankle injury, pain, surgery, or therapy: Yes; chronic hip, knee, foot pain Dominant hand: right Imaging: No  Typical footwear: Sneakers or rubber boots Red flags: Negative for personal history of cancer, chills/fever, night sweats, nausea, vomiting, unrelenting pain): Negative  PRECAUTIONS: None  WEIGHT BEARING RESTRICTIONS: No  FALLS: Has patient fallen in last 6 months? 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  OBJECTIVE:   Patient Surveys  08/14/23: FADI: FADI Activities Score: 80 / 104 or 77%  Cognition Patient is oriented to person, place, and time.  Recent memory is intact.  Remote memory is intact.  Attention span and concentration are intact.  Expressive speech is intact.  Patient's fund of knowledge is within normal limits for educational level.    Gross Musculoskeletal Assessment Bulk: Normal Tone: Normal No trophic changes noted to foot/ankle. No  ecchymosis, erythema, or edema noted. No gross ankle/foot deformity noted  GAIT: Comments: Mild toe out gait on right foot. Antalgic gait noted on left side.  Posture: No noted discrepancies.  AROM AROM (Normal range in degrees) AROM       Hip Right Left  Flexion (125) WNL WNL  Extension (15) WNL WNL  Abduction (40) WNL WNL  Adduction  WNL WNL  Internal Rotation (45) WNL WNL  External Rotation (45) WNL WNL      Knee    Flexion (135) WNL WNL  Extension (0) WNL WNL      Ankle    Dorsiflexion (20) 10 (WB) 10 (WB)  Plantarflexion (50) 40 35  Inversion (35) 30 20  Eversion (15) 7 8  (* = pain; Blank rows = not tested)   LE MMT: MMT (out of 5) Right  Left   Hip flexion 4 4  Hip extension 4 4  Hip abduction 4 4+  Hip adduction 4+ 4  Hip internal rotation 5 5  Hip external rotation 5 5  Knee flexion 4 4  Knee extension 5 5  Ankle dorsiflexion 5 5  Ankle plantarflexion    Ankle inversion 5 5*  Ankle eversion 5 5*  (* = pain; Blank rows = not tested)  Sensation Diminished sensation left S1 and lateral aspect of left foot.  Reflexes Deferred  Palpation Location LEFT  RIGHT           Plantar fascia 1 0  Dorsal aspect of foot 1 0  Tarsal tunnel 1 0  (Blank rows = not tested) Graded on 0-4 scale (0 = no pain, 1 = pain, 2 = pain with wincing/grimacing/flinching, 3 = pain with withdrawal, 4 = unwilling to allow palpation), (Blank rows = not tested)  Passive Accessory Motion Superior Tibiofibular Joint: WNL Inferior Tibiofibular Joint: WNL Talocrural Joint Distraction: WNL but noted pain with pulling feeling with left foot Talocrural Joint AP: L hypomobile, painful where hand is purchased Talocrural Joint PA: L hypomobile  SPECIAL  TESTS Ligamentous Integrity Anterior Drawer (ATF, 10-15 plantarflexion with anterior translation): Negative Talar Tilt (CFL, inversion): Negative    TODAY'S TREATMENT: 11/20/2023   Subjective: Pt reports ongoing L foot pain  today upon arrival. Her L knee has been painful recently and feels unstable. She is also reporting some R hip pain upon arrival today. She has not contacted podiatry to set up an appointment yet.    PAIN: 1/10 L knee pain, 3/10 R hip pain;   Ther-ex  NuStep L1-4 x 10 minutes for warm-up and BLE strengthening during interval history (5 minutes unbilled); ACL/PCL/MCL/LCL ligamentous testing WNL; HAGOS, worst hip pain;   Supine L quad sets over towel roll 5s hold x 10; Supine SLR hip flexion with 3# ankle weight x 10; Attempted hooklying L SAQ over bolster with manual resistance but DC secondary to pain; Hooklying clams with manual resistance x 10; Hooklying adductor squeezes with manual resistance x 10; Hooklying bridges with arms at side x 10; R sidelying straight leg L hip abduction with manual resistance 2 x 10; R sidelying L hip reverse clam with manual resistance 2 x 10; Seated L hamstring curls with blue tband 2 x 10;   Manual Therapy  STM to R lateral/posterior hip and thigh with Theraband Roller x 5 minutes;   Not today:  Heel raises with UE support x 15 (stopped due to pain in hamstring); DF in WB 06/13/23: 10 degrees BLE Ankle pumps 2 x 30s BLE; Seated ankle circumduction 2 x 30s each direction BLE; Seated resisted PF with green tband x 20 BLE; Seated resisted inversion with green tband x 20 BLE; Seated resisted eversion with green tband x 20 BLE; Supine isometric DF 3 x 20s BLE; Supine isometric PF 3 x 20s BLE; Ankle DF stretch 3 x 30s BLE; Pball straight knee bridges x 10; Prone L knee hamstring curls with manual resistance from therapist 2 x 10; Prone LLE straight knee hip extension 2 x 10; Prone L hip ER with knee at 90 flexion and manual resistance from therapist 2 x 10;   PATIENT EDUCATION:  Education details: Pt educated throughout session about proper posture and technique with exercises. Improved exercise technique, movement at target joints, use of target  muscles after min to mod verbal, visual, tactile cues.  Person educated: Patient Education method: Explanation, Demonstration, and Tactile cues Education comprehension: verbalized understanding and returned demonstration   HOME EXERCISE PROGRAM:  Access Code: [email or web address] URL: [email or web address] Date: 06/13/2023 Prepared by: Selinda Eck  Exercises - Seated Ankle Eversion with Resistance  - 1 x daily - 7 x weekly - 2 sets - 10 reps - 3s hold - Seated Ankle Inversion with Resistance and Legs Crossed (Mirrored)  - 1 x daily - 7 x weekly - 2 sets - 10 reps - 3s hold - Seated Ankle Plantarflexion with Resistance  - 1 x daily - 7 x weekly - 3    sets - 10 reps - 3s hold - Long Sitting Calf Stretch with Strap  - 1 x daily - 7 x weekly - 3 reps - 30-45s hold - Standing Gastroc Stretch on Step  - 1 x daily - 7 x weekly - 3 sets - 10 reps - Standing Dorsiflexion Self-Mobilization on Step  - 1 x daily - 7 x weekly - 3 sets - 10 reps - Standing Dorsiflexion Self-Mobilization on Step (Mirrored)  - 1 x daily - 7 x weekly - 3 sets - 10 reps - Standing on Blue foam x  30 secs with EC -heel raises on Blue foam 20 reps   ASSESSMENT:  CLINICAL IMPRESSION: Ongoing L knee and R hip pain today. Progressed strengthening during session to focus on quads, hamstrings, and glutes. No increase in hip or knee pain reported except during SAQ which were discontinued. Utilized STM to R lateral/posterior hip and thigh. Plan to update outcome measures and goals at next visit. Pt encouraged to follow-up as scheduled. She will benefit from PT services to address deficits in strength and pain in order to return to full function at home and with leisure activities.   OBJECTIVE IMPAIRMENTS: decreased strength and hypomobility.   ACTIVITY LIMITATIONS: stairs and locomotion level  PARTICIPATION LIMITATIONS: occupation and yard work  PERSONAL FACTORS: Age, Behavior pattern, Past/current experiences, Time  since onset of injury/illness/exacerbation, and 1-2 comorbidities: type II diabetes and left lumbar radiculopathy are also affecting patient's functional outcome.   REHAB POTENTIAL: Good  CLINICAL DECISION MAKING: Evolving/moderate complexity  EVALUATION COMPLEXITY: Moderate   GOALS: Goals reviewed with patient? Yes  SHORT TERM GOALS: Target date:   Pt will be independent with HEP to improve strength and decrease ankle pain to improve pain-free function at home and work. Baseline:  Goal status: ACHIEVED   LONG TERM GOALS: Target date: 11/27/23  1.  Pt will decrease worst ankle pain by at least 3 points on the NPRS in order to demonstrate clinically significant reduction in ankle pain. Baseline: 06/08/23: 1/10 best, 5/10 worst; 08/14/23: worst: 1/10; Goal status: ACHIEVED  2.  Pt will increase FADI score by at least 4 points in order demonstrate clinically significant reduction in ankle pain/disability.       Baseline: 06/13/23: 65%; 08/14/23: 80 / 104 or 77% Goal status: ACHIEVED  3.  Pt will improve strength of ankle inversion/eversion with limited to no pain in order to demonstrate improvement in strength and function.  Baseline: 06/08/23: 5/5 with pain; 08/14/23: 5/5 without pain Goal status: ACHIEVED  4.  Pt will report at least 50% improvement in L ankle and knee symptoms in order to demonstrate improvement in her ability to work in the yard/garden with less pain.  Baseline: 08/14/23: 75% improvement Goal status: ACHIEVED  5.  Pt will decrease HAGOS score by at least 20 points in order demonstrate clinically significant reduction in hip pain/disability.  Baseline: 08/14/23: HAGOS Score: 37.7% (Symptoms Score: 50%; Pain Score: 37.5%; Physical Function, Daily Living Score: 45%; Function, Sports, and Recreational Activities Score: 43.7%; Participation in Physical Activities Score: 25%; Quality of Life Score: 25 %) Goal status: INITIAL  6.  Pt will decrease worst posterior R hip pain by  at least 3 points on the NPRS in order to demonstrate clinically significant reduction in hip pain. Baseline: 08/14/23: worst: 8/10; Goal status: INITIAL   PLAN: PT FREQUENCY: 1-2x/week  PT DURATION: 12 weeks  PLANNED INTERVENTIONS: Therapeutic exercises, Therapeutic activity, Neuromuscular re-education, Balance training, Gait training, Patient/Family education, Self Care, Joint mobilization, Joint manipulation, Vestibular training, Canalith repositioning, Orthotic/Fit training, DME instructions, Dry Needling, Electrical stimulation, Spinal manipulation, Spinal mobilization, Cryotherapy, Moist heat, Taping, Traction, Ultrasound, Ionotophoresis 4mg /ml Dexamethasone , Manual therapy, and Re-evaluation.  PLAN FOR NEXT SESSION: Update outcome measures/goals, strengthening and mobility exercises focused on R hip pain, review and modify HEP as necessary  Demaree Liberto D Drexler Maland PT, DPT, GCS  12:00 PM,11/29/23

## 2023-11-29 NOTE — Progress Notes (Signed)
     Primary Care / Sports Medicine Office Visit  Patient Information:  Patient ID: Brandy [name], female DOB: 30-Jan-1956 Age: 68 y.o. MRN: [id number]   Brandy [name] is a pleasant 68 y.o. female presenting with the following:  Chief Complaint  Patient presents with   Medical Management of Chronic Issues    Patient's routine follow up on joint pains. Patient has been using her oils for pain management regularly and ibuprofen on occasions.     Vitals:   11/26/23 1532  BP: 110/80  Pulse: 72  SpO2: 95%   Vitals:   11/26/23 1532  Weight: 232 lb 9.6 oz (105.5 kg)  Height: 5' 7 (1.702 m)   Body mass index is 36.43 kg/m.  Independent interpretation of notes and tests performed by another provider:   None  Procedures performed:   None  Pertinent History, Exam, Impression, and Recommendations:   Problem List Items Addressed This Visit     Greater trochanteric pain syndrome of both lower extremities - Primary   Bilateral hip pain - Bilateral hip pain, located in the lower back part of the hip - Pain exacerbated by sitting and ascending stairs - Prolonged sitting aggravates the pain - Pain particularly severe today - Difficulty sleeping on their side due to hip pain, resulting in disrupted sleep  Physical Exam PALPATION: Minimal tenderness at the base of the fifth metatarsal on the left. No crepitus. Minor tenderness at the right sacroiliac joint and right greater trochanter maximally. Tenderness at the left greater trochanter. SPECIAL TESTS: Modified seated straight leg raise benign on the left.  Greater trochanteric pain syndrome Greater trochanteric pain syndrome with tenderness at the right SI joint and bilateral greater trochanteric regions, affecting sleep and relieved by physical therapy. - Schedule bilateral hip bursa injection with cortisone under ultrasound guidance. - Use ice and ibuprofen post-injection. - Increase physical therapy sessions post-injection.       Osteoarthritis of both knees   Bilateral knee pain - Bilateral knee pain, with the left knee more problematic - Sharp pain, often occurring when ascending stairs - History of left knee pain with prior x-rays performed - Specific physical therapy exercises help ease the pain  RADIOLOGY Right knee X-ray: Mild degenerative changes at the patellofemoral joint (02/2022) Left knee X-ray: Severe degenerative changes at the patellofemoral joint with narrowed tricompartment space (07/2022)  Osteoarthritis of left knee Osteoarthritis of the left knee with severe wear and tear, particularly at the patellofemoral articulation, confirmed by previous x-rays. - Consider cortisone injection for the left knee if pain persists after hip injections. - Discuss alternative treatment options such as lubricant injections. - Contact if knee pain becomes unmanageable.        Orders & Medications Medications: No orders of the defined types were placed in this encounter.  No orders of the defined types were placed in this encounter.    No follow-ups on file.     [name], MD, [facility]   Primary Care Sports Medicine Primary Care and Sports Medicine at [facility]

## 2023-11-30 ENCOUNTER — Ambulatory Visit

## 2023-12-01 ENCOUNTER — Ambulatory Visit: Payer: Self-pay | Admitting: Cardiology

## 2023-12-05 NOTE — Progress Notes (Signed)
 [facility] Summary Report / Loop Recorder

## 2023-12-07 ENCOUNTER — Other Ambulatory Visit (INDEPENDENT_AMBULATORY_CARE_PROVIDER_SITE_OTHER): Payer: Self-pay | Admitting: Radiology

## 2023-12-07 ENCOUNTER — Encounter: Payer: Self-pay | Admitting: Family Medicine

## 2023-12-07 ENCOUNTER — Ambulatory Visit: Admitting: Family Medicine

## 2023-12-07 ENCOUNTER — Encounter

## 2023-12-07 VITALS — BP 112/60 | HR 58 | Ht 67.0 in | Wt 232.4 lb

## 2023-12-07 DIAGNOSIS — M25552 Pain in left hip: Secondary | ICD-10-CM

## 2023-12-07 DIAGNOSIS — M25551 Pain in right hip: Secondary | ICD-10-CM

## 2023-12-07 MED ORDER — TRIAMCINOLONE ACETONIDE 40 MG/ML IJ SUSP
80.0000 mg | Freq: Once | INTRAMUSCULAR | Status: AC
  Administered 2023-12-07: 80 mg via INTRA_ARTICULAR

## 2023-12-07 NOTE — Assessment & Plan Note (Addendum)
 History of Present Illness Brandy [name] is a 68 year old female who presents for a follow-up on bilateral greater trochanteric pain syndrome.  Bilateral lateral hip pain, right greater than left - Persistent pain localized to the outer hip region - Pain severity increased this week due to increased activity, including walking around the yard with her dog - Pain is exacerbated by ambulation - Inadequate rest has contributed to increased discomfort  Functional limitations - Unable to lie on right side for more than three minutes due to pain - Able to lie on left side for a longer duration - Difficulty resting comfortably due to pain  Physical therapy response - Currently undergoing weekly physical therapy sessions - Therapy is painful, especially when a roller is used on the affected area - Continues therapy to strengthen and loosen the muscle to prevent recurrence despite discomfort  Assessment and Plan Greater trochanteric pain syndrome -bilateral Chronic pain in the greater trochanteric region due to gluteus medius conditioning coupled with relative overuse, exacerbated by activity and spine issues. - Administer cortisone injections to both hips, under ultrasound guidance. - Advise to avoid strenuous activities for two days. - Apply ice to hips for 20 minutes twice daily. - Use OTC analgesics as needed. - Continue weekly physical therapy focusing on hip bursa (greater trochanteric pain syndrome).

## 2023-12-07 NOTE — Progress Notes (Signed)
 Primary Care / Sports Medicine Office Visit  Patient Information:  Patient ID: Brandy [name], female DOB: 1955-11-09 Age: 68 y.o. MRN: [id number]   Brandy [name] is a pleasant 68 y.o. female presenting with the following:  Chief Complaint  Patient presents with   Hip Pain    Patient presents today for bil hip pain. She is requesting cortisone injection today.     Vitals:   12/07/23 0905  BP: 112/60  Pulse: (!) 58  SpO2: 99%   Vitals:   12/07/23 0905  Weight: 232 lb 6.4 oz (105.4 kg)  Height: 5' 7 (1.702 m)   Body mass index is 36.4 kg/m.     Independent interpretation of notes and tests performed by another provider:   None  Procedures performed:   Procedure:  Injection of right hip under ultrasound guidance. Ultrasound guidance utilized for out of plane approach to right greater trochanteric region, sonographically noted hypoechoic area consistent with bursitis Samsung HS60 device utilized with permanent recording / reporting. Verbal informed consent obtained and verified. Skin prepped in a sterile fashion. Ethyl chloride for topical local analgesia.  Completed without difficulty and tolerated well. Medication: triamcinolone  acetonide 40 mg/mL suspension for injection 1 mL total and 2 mL lidocaine  1% without epinephrine utilized for needle placement anesthetic Advised to contact for fevers/chills, erythema, induration, drainage, or persistent bleeding.  Procedure:  Injection of left hip under ultrasound guidance. Ultrasound guidance utilized for out of plane left greater trochanteric region approach, no sonographic abnormalities, confirmation of injectate visualized Samsung HS60 device utilized with permanent recording / reporting. Verbal informed consent obtained and verified. Skin prepped in a sterile fashion. Ethyl chloride for topical local analgesia.  Completed without difficulty and tolerated well. Medication: triamcinolone  acetonide 40 mg/mL suspension for  injection 1 mL total and 2 mL lidocaine  1% without epinephrine utilized for needle placement anesthetic Advised to contact for fevers/chills, erythema, induration, drainage, or persistent bleeding.   Pertinent History, Exam, Impression, and Recommendations:   Problem List Items Addressed This Visit     Greater trochanteric pain syndrome of both lower extremities - Primary   History of Present Illness Brandy [name] is a 68 year old female who presents for a follow-up on bilateral greater trochanteric pain syndrome.  Bilateral lateral hip pain, right greater than left - Persistent pain localized to the outer hip region - Pain severity increased this week due to increased activity, including walking around the yard with her dog - Pain is exacerbated by ambulation - Inadequate rest has contributed to increased discomfort  Functional limitations - Unable to lie on right side for more than three minutes due to pain - Able to lie on left side for a longer duration - Difficulty resting comfortably due to pain  Physical therapy response - Currently undergoing weekly physical therapy sessions - Therapy is painful, especially when a roller is used on the affected area - Continues therapy to strengthen and loosen the muscle to prevent recurrence despite discomfort  Assessment and Plan Greater trochanteric pain syndrome -bilateral Chronic pain in the greater trochanteric region due to gluteus medius conditioning coupled with relative overuse, exacerbated by activity and spine issues. - Administer cortisone injections to both hips, under ultrasound guidance. - Advise to avoid strenuous activities for two days. - Apply ice to hips for 20 minutes twice daily. - Use OTC analgesics as needed. - Continue weekly physical therapy focusing on hip bursa (greater trochanteric pain syndrome).      Relevant  Orders   US  LIMITED JOINT SPACE STRUCTURES LOW BILAT   Other Visit Diagnoses       Greater  trochanteric pain syndrome of left lower extremity [M25.552]         Greater 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 [M25.551]            Orders & Medications Medications:  Meds ordered this encounter  Medications   triamcinolone  acetonide (KENALOG -40) injection 80 mg   Orders Placed This Encounter  Procedures   US  LIMITED JOINT SPACE STRUCTURES LOW BILAT     No follow-ups on file.     [name], MD, [facility]   Primary Care Sports Medicine Primary Care and Sports Medicine at [facility]

## 2023-12-07 NOTE — Patient Instructions (Signed)
 You have just been given a cortisone injection to reduce pain and inflammation. After the injection you may notice immediate relief of pain as a result of the Lidocaine . It is important to rest the area of the injection for 24 to 48 hours after the injection. There is a possibility of some temporary increased discomfort and swelling for up to 72 hours until the cortisone begins to work. If you do have pain, simply rest the joint and use ice. If you can tolerate over the counter medications, you can try Tylenol , Aleve, or Advil for added relief per package instructions.  Patient Plan for Post-Visit Guidance  1. Greater Trochanteric Pain Syndrome:    - Receive cortisone injections in both hips under ultrasound guidance.    - Avoid strenuous activities for two days following the injections.    - Apply ice to your hips for 20 minutes, twice daily.    - Use over-the-counter pain relievers as needed for discomfort.    - Continue attending weekly physical therapy sessions focusing on hip bursa management (greater trochanteric pain syndrome).  Red Flags: - If you experience increased pain, swelling, or any new symptoms, contact the office immediately.

## 2023-12-10 NOTE — Therapy (Signed)
 OUTPATIENT PHYSICAL THERAPY ANKLE/KNEE/HIP TREATMENT/RECERTIFICATION  Patient Name: Brandy [name] MRN: [id number] DOB:03-Apr-1956, 68 y.o., female Today's Date: 12/11/2023  END OF SESSION:  PT End of Session - 12/11/23 0934     Visit Number 19    Number of Visits 49    Date for PT Re-Evaluation 11/27/23    Authorization Type eval: 06/08/23    PT Start Time 0935    PT Stop Time 1020    PT Time Calculation (min) 45 min    Activity Tolerance Patient tolerated treatment well    Behavior During Therapy [facility] for tasks assessed/performed          Past Medical History:  Diagnosis Date   Acute lower GI bleeding 01/19/2020   Arthritis    Atrial fibrillation with rapid ventricular response (HCC)    Chicken pox    Chronic systolic CHF (congestive heart failure) (HCC)    a. 03/2016 Echo: Ef 15-20%, diff HK, ant AK;  b. 05/2016 Echo: Ef 30-35%, diff HK, mildly dil LA/RA.   Diabetes mellitus without complication (HCC)    Dysrhythmia    GERD (gastroesophageal reflux disease)    Heart murmur    Hip discomfort    been going on for 40 years    History of kidney stones    Hyperlipidemia associated with type 2 diabetes mellitus (HCC) 05/12/2021   Hypertension    Moderate mitral regurgitation    a. 03/2016 Echo: mod MR in setting of LV dysfxn.   Motion sickness    car - back seat   NICM (nonischemic cardiomyopathy) (HCC)    a. 03/2016 Echo: EF 15-20%, diff HK, ant AK, mod MR, mod dil LA, mildly dil RA;  b. 04/2016 Cath: nl cors;  c. 05/2016 Echo: EF 30-35%, diff HK.   Obesity    Obstructive sleep apnea    compliant with CPAP   Persistent atrial fibrillation (HCC)    a. Dx 03/2016;  b. CHA2DS2VASc = 2-->Eliquis  5mg  BID;  b. 05/2016 Failed DCCV x 4.   Sleep apnea    Stomach irritation    Visit for monitoring Tikosyn  therapy 07/24/2016   Past Surgical History:  Procedure Laterality Date   ATRIAL FIBRILLATION ABLATION N/A 12/09/2020   Procedure: ATRIAL FIBRILLATION ABLATION;  Surgeon: Cindie Ole ONEIDA, MD;  Location: [facility] INVASIVE CV LAB;  Service: Cardiovascular;  Laterality: N/A;   BIOPSY N/A 01/12/2020   Procedure: BIOPSY;  Surgeon: Janalyn Keene NOVAK, MD;  Location: [facility] SUR[facility]ERY [facility];  Service: Endoscopy;  Laterality: N/A;   CARDIAC CATHETERIZATION N/A 04/17/2016   Procedure: Left Heart Cath and Coronary Angiography;  Surgeon: Deatrice DELENA Cage, MD;  Location: [facility] INVASIVE CV LAB;  Service: Cardiovascular;  Laterality: N/A;   CARDIOVERSION N/A 10/22/2020   Procedure: CARDIOVERSION;  Surgeon: Mady Bruckner, MD;  Location: [facility] ORS;  Service: Cardiovascular;  Laterality: N/A;   CHOLECYSTECTOMY     COLON SURGERY  2021   COLONOSCOPY N/A 01/20/2020   Procedure: COLONOSCOPY;  Surgeon: Maryruth Ole ONEIDA, MD;  Location: [facility] ENDOSCOPY;  Service: Endoscopy;  Laterality: N/A;   COLONOSCOPY WITH PROPOFOL  N/A 01/12/2020   Procedure: COLONOSCOPY WITH PROPOFOL ;  Surgeon: Janalyn Keene NOVAK, MD;  Location: [facility];  Service: Endoscopy;  Laterality: N/A;  Diabetic - oral meds sleep apnea   ELECTROPHYSIOLOGIC STUDY N/A 05/26/2016   Procedure: Cardioversion;  Surgeon: Deatrice DELENA Cage, MD;  Location: [facility] ORS;  Service: Cardiovascular;  Laterality: N/A;   ESOPHAGOGASTRODUODENOSCOPY (EGD) WITH PROPOFOL  N/A 01/12/2020   Procedure: ESOPHAGOGASTRODUODENOSCOPY (EGD)  WITH PROPOFOL ;  Surgeon: Janalyn Keene NOVAK, MD;  Location: [facility] SUR[facility]ERY [facility];  Service: Endoscopy;  Laterality: N/A;   POLYPECTOMY N/A 01/12/2020   Procedure: POLYPECTOMY;  Surgeon: Janalyn Keene NOVAK, MD;  Location: [facility] SURGERY [facility];  Service: Endoscopy;  Laterality: N/A;   Patient Active Problem List   Diagnosis Date Noted   Mucous cyst of digit of left hand 05/18/2023   Peroneal tendinitis, left 10/13/2022   Left lumbar radiculopathy 07/26/2022   Arthralgia of left knee 07/26/2022   Pes anserinus bursitis of right knee 04/19/2022   Osteoarthritis of both knees 03/27/2022   Greater trochanteric pain syndrome  of both lower extremities 03/27/2022   It band syndrome, right 03/27/2022   Hyperlipidemia associated with type 2 diabetes mellitus (HCC) 05/12/2021   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   Varicose veins of leg with pain, bilateral 07/06/2020   Type II diabetes mellitus with complication (HCC) 01/19/2020   Gastric polyp    Colon polyps    Varicose veins of both lower extremities 10/20/2019   Dysphagia 10/20/2019   Osteoarthritis of right hand 10/31/2016   Nonischemic cardiomyopathy (HCC) 04/14/2016   BMI 33.0-33.9,adult 04/14/2016    PCP: Leita Adie, MD  REFERRING PROVIDER: Selinda Ku, MD  REFERRING DIAG:  [phone] (ICD-10-CM) - Peroneal tendinitis, left  M54.16 (ICD-10-CM) - Left lumbar radiculopathy  M25.562 (ICD-10-CM) - Arthralgia of left knee  M17.11 (ICD-10-CM) - Primary osteoarthritis of right knee  M76.31 (ICD-10-CM) - It band syndrome, right  M25.551 (ICD-10-CM) - Greater 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   Rationale for Evaluation and Treatment: Rehabilitation  THERAPY DIAG: Muscle weakness (generalized)  Pain in right hip  Chronic pain of left knee  ONSET DATE: Chronic, aggravated over Christmas (December 2024)  FOLLOW-UP APPT SCHEDULED WITH REFERRING PROVIDER: No, initially no but now has appt for July;  INITIAL EVALUATION: SUBJECTIVE:                                                                                                                                                                                         SUBJECTIVE STATEMENT:  Left ankle and foot pain  PERTINENT HISTORY: Pt reports pain in left ankle that seems to have flared up over Christmas after pt reported excess walking. Pt reports feeling pulling and burning at the base of the lateral ankle. She has previously been to PT for pain in her left hip and knee but has now noticed increased pain in the left ankle/foot. Pt expresses difficulty with  exercises; specifically pressure on the toes, stretching calves, and rocking on toes. Pt notes pain is as high as 5/10 NPS but is constant  and never really goes away, with lowest pain at 1/10 NPS.  PAIN:    Pain Intensity: Present: 1/10, Best: 1/10, Worst: 5/10 Pain location: Lateral foot and ankle Pain Quality: constant and burning  Radiating: Yes  Swelling: No  Popping, catching, locking: No  Numbness/Tingling: No Focal Weakness: Yes Aggravating factors: Touching lateral ankle, moving the ankle, ascending steps when leading with left foot, walking Relieving factors: Stopping activity, resting, brace temporarily relieves some pain 24-hour pain behavior: No night pain when sleeping on back, stretching in AM helps but feels unstable, PM pain depends on activity during the day History of prior back, hip, knee, or ankle injury, pain, surgery, or therapy: Yes; chronic hip, knee, foot pain Dominant hand: right Imaging: No  Typical footwear: Sneakers or rubber boots Red flags: Negative for personal history of cancer, chills/fever, night sweats, nausea, vomiting, unrelenting pain): Negative  PRECAUTIONS: None  WEIGHT BEARING RESTRICTIONS: No  FALLS: Has patient fallen in last 6 months? 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  OBJECTIVE:   Patient Surveys  08/14/23: FADI: FADI Activities Score: 80 / 104 or  77%  Cognition Patient is oriented to person, place, and time.  Recent memory is intact.  Remote memory is intact.  Attention span and concentration are intact.  Expressive speech is intact.  Patient's fund of knowledge is within normal limits for educational level.    Gross Musculoskeletal Assessment Bulk: Normal Tone: Normal No trophic changes noted to foot/ankle. No ecchymosis, erythema, or edema noted. No gross ankle/foot deformity noted  GAIT: Comments: Mild toe out gait on right foot. Antalgic gait noted on left side.  Posture: No noted discrepancies.  AROM AROM (Normal range in degrees) AROM       Hip Right Left  Flexion (125) WNL WNL  Extension (15) WNL WNL  Abduction (40) WNL WNL  Adduction  WNL WNL  Internal Rotation (45) WNL WNL  External Rotation (45) WNL WNL      Knee    Flexion (135) WNL WNL  Extension (0) WNL WNL      Ankle    Dorsiflexion (20) 10 (WB) 10 (WB)  Plantarflexion (50) 40 35  Inversion (35) 30 20  Eversion (15) 7 8  (* = pain; Blank rows = not tested)   LE MMT: MMT (out of 5) Right  Left   Hip flexion 4 4  Hip extension 4 4  Hip abduction 4 4+  Hip adduction 4+ 4  Hip internal rotation 5 5  Hip external rotation 5 5  Knee flexion 4 4  Knee extension 5 5  Ankle dorsiflexion 5 5  Ankle plantarflexion    Ankle inversion 5 5*  Ankle eversion 5 5*  (* = pain; Blank rows = not tested)  Sensation Diminished sensation left S1 and lateral aspect of left foot.  Reflexes Deferred  Palpation Location LEFT  RIGHT           Plantar fascia 1 0  Dorsal aspect of foot  1 0  Tarsal tunnel 1 0  (Blank rows = not tested) Graded on 0-4 scale (0 = no pain, 1 = pain, 2 = pain with wincing/grimacing/flinching, 3 = pain with withdrawal, 4 = unwilling to allow palpation), (Blank rows = not tested)  Passive Accessory Motion Superior Tibiofibular Joint: WNL Inferior Tibiofibular Joint: WNL Talocrural Joint Distraction: WNL but noted pain  with pulling feeling with left foot Talocrural Joint AP: L hypomobile, painful where hand is purchased Talocrural Joint PA: L hypomobile  SPECIAL TESTS Ligamentous Integrity Anterior Drawer (ATF, 10-15 plantarflexion with anterior translation): Negative Talar Tilt (CFL, inversion): Negative    TODAY'S TREATMENT: 12/11/2023   Subjective: Pt saw Dr. Alvia for her ongoing bilateral hip pain (notably worse on the right). He injected both hips with triamcinolone /lidocaine  which has resulted in marked improvement in her pain. Her L knee pain has improved since the last therapy session. She still has not contacted podiatry to set up an appointment yet for her L foot pain but she reports that it has been relatively well managed currently.    PAIN: R hip pain;   Ther-ex  NuStep L1-4 x 10 minutes for warm-up and BLE strengthening during interval history (5 minutes unbilled); HAGOS: 44.3% (Symptoms Score: 60.7%; Pain Score: 50%; Physical Function, Daily Living Score: 55%; Function, Sports, and Recreational Activities Score: 50%; Participation in Physical Activities Score: 25%; Quality of Life Score: 25%) Worst hip pain: 2/10; Hooklying isometric clams with belt around knees 30s hold 2 x 5; Hooklying isometric ball squeeze 5s hold 2 x 10; Hooklying bridges with green tband around knees and arms at side 2 x 10; Updated HEP and reviewed with patient;   Not today:  Heel raises with UE support x 15 (stopped due to pain in hamstring); DF in WB 06/13/23: 10 degrees BLE Ankle pumps 2 x 30s BLE; Seated ankle circumduction 2 x 30s each direction BLE; Seated resisted PF with green tband x 20 BLE; Seated resisted inversion with green tband x 20 BLE; Seated resisted eversion with green tband x 20 BLE; Supine isometric DF 3 x 20s BLE; Supine isometric PF 3 x 20s BLE; Ankle DF stretch 3 x 30s BLE; Pball straight knee bridges x 10; Prone L knee hamstring curls with manual resistance from therapist  2 x 10; Prone LLE straight knee hip extension 2 x 10; Prone L hip ER with knee at 90 flexion and manual resistance from therapist 2 x 10; Supine L quad sets over towel roll 5s hold x 10; Supine SLR hip flexion with 3# ankle weight x 10; R sidelying straight leg L hip abduction with manual resistance 2 x 10; R sidelying L hip reverse clam with manual resistance 2 x 10;   PATIENT EDUCATION:  Education details: Pt educated throughout session about proper posture and technique with exercises. Improved exercise technique, movement at target joints, use of target muscles after min to mod verbal, visual, tactile cues.  Updated HEP Person educated: Patient Education method: Explanation, Tactile cues, Verbal cues, and Handouts Education comprehension: verbalized understanding and returned demonstration   HOME EXERCISE PROGRAM:  Access Code: [email or web address] URL: [email or web address] Date: 12/11/2023 Prepared by: Selinda Eck  Exercises - Seated Ankle Eversion with Resistance  - 1 x daily - 7 x weekly - 2 sets - 10 reps - 3s hold - Seated Ankle Inversion with Resistance and Legs Crossed (Mirrored)  - 1 x daily - 7 x weekly - 2 sets - 10 reps - 3s hold - Seated Ankle  Plantarflexion with Resistance  - 1 x daily - 7 x weekly - 2 sets - 10 reps - 3s hold - Long Sitting Calf Stretch with Strap  - 1 x daily - 7 x weekly - 3 reps - 30-45s hold - Standing Gastroc Stretch on Step  - 1 x daily - 7 x weekly - 3 sets - 10 reps - Standing Dorsiflexion Self-Mobilization on Step  - 1 x daily - 7 x weekly - 3 sets - 10 reps - Standing Dorsiflexion Self-Mobilization on Step (Mirrored)  - 1 x daily - 7 x weekly - 3 sets - 10 reps - Hooklying Isometric Hip Abduction with Belt  - 1 x daily - 7 x weekly - 2 sets - 5 reps - 30s hold/30s relax hold   ASSESSMENT:  CLINICAL IMPRESSION: Updated outcome measures with patient during visit today. Her worst R hip pain has improved considerably since her  injection. Her HAGOS score has improved from 38% initially to 44% today which has not yet reached clinical significance but is trending in the right direction. She reports ongoing intermittent and activity dependent L foot, L ankle, L knee, and L hip pain. However she has met all the goals related to her ankle and knee. Progressed isometric strengthening for hip rotators during session with focus on longer holds and longer rest times. Updated HEP and pt encouraged to follow-up as scheduled. She will benefit from PT services to address deficits in strength and pain in order to improve pain-free function at home and with leisure activities.   OBJECTIVE IMPAIRMENTS: decreased strength and hypomobility.   ACTIVITY LIMITATIONS: stairs and locomotion level  PARTICIPATION LIMITATIONS: occupation and yard work  PERSONAL FACTORS: Age, Behavior pattern, Past/current experiences, Time since onset of injury/illness/exacerbation, and 1-2 comorbidities: type II diabetes and left lumbar radiculopathy are also affecting patient's functional outcome.   REHAB POTENTIAL: Good  CLINICAL DECISION MAKING: Evolving/moderate complexity  EVALUATION COMPLEXITY: Moderate   GOALS: Goals reviewed with patient? Yes  SHORT TERM GOALS:   Pt will be independent with HEP to improve strength and decrease ankle pain to improve pain-free function at home and work. Baseline:  Goal status: ACHIEVED   LONG TERM GOALS: Target date: 03/04/2024   1.  Pt will decrease worst ankle pain by at least 3 points on the NPRS in order to demonstrate clinically significant reduction in ankle pain. Baseline: 06/08/23: 1/10 best, 5/10 worst; 08/14/23: worst: 1/10; Goal status: ACHIEVED  2.  Pt will increase FADI score by at least 4 points in order demonstrate clinically significant reduction in ankle pain/disability.       Baseline: 06/13/23: 65%; 08/14/23: 80 / 104 or 77% Goal status: ACHIEVED  3.  Pt will improve strength of ankle  inversion/eversion with limited to no pain in order to demonstrate improvement in strength and function.  Baseline: 06/08/23: 5/5 with pain; 08/14/23: 5/5 without pain Goal status: ACHIEVED  4.  Pt will report at least 50% improvement in L ankle and knee symptoms in order to demonstrate improvement in her ability to work in the yard/garden with less pain.  Baseline: 08/14/23: 75% improvement Goal status: ACHIEVED  5.  Pt will decrease HAGOS score by at least 20 points in order demonstrate clinically significant reduction in hip pain/disability.  Baseline: 08/14/23: HAGOS Score: 37.7%, 12/11/23: 44.3%  Goal status: PARTIALLY MET  6.  Pt will decrease worst posterior R hip pain by at least 3 points on the NPRS in order to demonstrate clinically significant reduction  in hip pain. Baseline: 08/14/23: worst: 8/10; 12/11/23: 2/10; Goal status: ACHIEVED   PLAN: PT FREQUENCY: 1x/week  PT DURATION: 12 weeks  PLANNED INTERVENTIONS: Therapeutic exercises, Therapeutic activity, Neuromuscular re-education, Balance training, Gait training, Patient/Family education, Self Care, Joint mobilization, Joint manipulation, Vestibular training, Canalith repositioning, Orthotic/Fit training, DME instructions, Dry Needling, Electrical stimulation, Spinal manipulation, Spinal mobilization, Cryotherapy, Moist heat, Taping, Traction, Ultrasound, Ionotophoresis 4mg /ml Dexamethasone , Manual therapy, and Re-evaluation.  PLAN FOR NEXT SESSION: progress note, strengthening and mobility exercises focused on R hip pain, review and modify HEP as necessary  Oseph Imburgia D Jovonte Commins PT, DPT, GCS  9:34 AM,12/11/23

## 2023-12-11 ENCOUNTER — Ambulatory Visit

## 2023-12-11 DIAGNOSIS — M25551 Pain in right hip: Secondary | ICD-10-CM | POA: Diagnosis not present

## 2023-12-11 DIAGNOSIS — M6281 Muscle weakness (generalized): Secondary | ICD-10-CM

## 2023-12-11 DIAGNOSIS — G8929 Other chronic pain: Secondary | ICD-10-CM

## 2023-12-11 DIAGNOSIS — M25562 Pain in left knee: Secondary | ICD-10-CM | POA: Diagnosis not present

## 2023-12-12 ENCOUNTER — Ambulatory Visit: Payer: Medicare Other | Admitting: Emergency Medicine

## 2023-12-12 VITALS — Ht 67.0 in | Wt 227.0 lb

## 2023-12-12 DIAGNOSIS — Z0000 Encounter for general adult medical examination without abnormal findings: Secondary | ICD-10-CM

## 2023-12-12 DIAGNOSIS — H9193 Unspecified hearing loss, bilateral: Secondary | ICD-10-CM

## 2023-12-12 DIAGNOSIS — E119 Type 2 diabetes mellitus without complications: Secondary | ICD-10-CM

## 2023-12-12 NOTE — Patient Instructions (Signed)
 Brandy [name] , Thank you for taking time out of your busy schedule to complete your Annual Wellness Visit with me. I enjoyed our conversation and look forward to speaking with you again next year. I, as well as your care team,  appreciate your ongoing commitment to your health goals. Please review the following plan we discussed and let me know if I can assist you in the future. Your Game plan/ To Do List    Referrals: If you haven't heard from the office you've been referred to, please reach out to them at the phone provided.  I have placed a referral to Diabetes & Nutrition Education at [facility]. Phone # [phone]. I have placed a referral to [facility] for a hearing evaluation. Phone # [phone] ([place] location)  Follow up Visits: Next Medicare AWV with our clinical staff: 12/17/24 @ 2:40pm (VIDEO VISIT)   Have you seen your provider in the last 6 months (3 months if uncontrolled diabetes)? Yes Next Office Visit with your provider: 01/25/24 @ 9:40am with Dr. Justus  Clinician Recommendations: Get a tetanus shot at your local pharmacy at your convenience. Aim for 30 minutes of exercise or brisk walking, 6-8 glasses of water , and 5 servings of fruits and vegetables each day.       This is a list of the screening recommended for you and due dates:  Health Maintenance  Topic Date Due   DTaP/Tdap/Td vaccine (2 - Tdap) 12/11/2011   COVID-19 Vaccine (4 - 2024-25 season) 01/14/2023   Flu Shot  12/14/2023   Eye exam for diabetics  03/08/2024   Hemoglobin A1C  03/16/2024   Yearly kidney function blood test for diabetes  05/17/2024   Yearly kidney health urinalysis for diabetes  05/17/2024   Complete foot exam   05/17/2024   Colon Cancer Screening  05/31/2024   Medicare Annual Wellness Visit  12/11/2024   Mammogram  05/13/2025   DEXA scan (bone density measurement)  05/13/2028   Pneumococcal Vaccine for age over 53  Completed   Hepatitis C Screening  Completed   Zoster (Shingles) Vaccine   Completed   Hepatitis B Vaccine  Aged Out   HPV Vaccine  Aged Out   Meningitis B Vaccine  Aged Out    Advanced directives: (In Chart) A copy of your advanced directives are scanned into your chart should your provider ever need it. Advance Care Planning is important because it:  [x]  Makes sure you receive the medical care that is consistent with your values, goals, and preferences  [x]  It provides guidance to your family and loved ones and reduces their decisional burden about whether or not they are making the right decisions based on your wishes.  Follow the link provided in your after visit summary or read over the paperwork we have mailed to you to help you started getting your Advance Directives in place. If you need assistance in completing these, please reach out to us  so that we can help you!  See attachments for Preventive Care and Fall Prevention Tips.   Fall Prevention in the Home, Adult Falls can cause injuries and affect people of all ages. There are many simple things that you can do to make your home safe and to help prevent falls. If you need it, ask for help making these changes. What actions can I take to prevent falls? General information Use good lighting in all rooms. Make sure to: Replace any light bulbs that burn out. Turn on lights if it  is dark and use night-lights. Keep items that you use often in easy-to-reach places. Lower the shelves around your home if needed. Move furniture so that there are clear paths around it. Do not keep throw rugs or other things on the floor that can make you trip. If any of your floors are uneven, fix them. Add color or contrast paint or tape to clearly mark and help you see: Grab bars or handrails. First and last steps of staircases. Where the edge of each step is. If you use a ladder or stepladder: Make sure that it is fully opened. Do not climb a closed ladder. Make sure the sides of the ladder are locked in place. Have  someone hold the ladder while you use it. Know where your pets are as you move through your home. What can I do in the bathroom?     Keep the floor dry. Clean up any water  that is on the floor right away. Remove soap buildup in the bathtub or shower. Buildup makes bathtubs and showers slippery. Use non-skid mats or decals on the floor of the bathtub or shower. Attach bath mats securely with double-sided, non-slip rug tape. If you need to sit down while you are in the shower, use a non-slip stool. Install grab bars by the toilet and in the bathtub and shower. Do not use towel bars as grab bars. What can I do in the bedroom? Make sure that you have a light by your bed that is easy to reach. Do not use any sheets or blankets on your bed that hang to the floor. Have a firm bench or chair with side arms that you can use for support when you get dressed. What can I do in the kitchen? Clean up any spills right away. If you need to reach something above you, use a sturdy step stool that has a grab bar. Keep electrical cables out of the way. Do not use floor polish or wax that makes floors slippery. What can I do with my stairs? Do not leave anything on the stairs. Make sure that you have a light switch at the top and the bottom of the stairs. Have them installed if you do not have them. Make sure that there are handrails on both sides of the stairs. Fix handrails that are broken or loose. Make sure that handrails are as long as the staircases. Install non-slip stair treads on all stairs in your home if they do not have carpet. Avoid having throw rugs at the top or bottom of stairs, or secure the rugs with carpet tape to prevent them from moving. Choose a carpet design that does not hide the edge of steps on the stairs. Make sure that carpet is firmly attached to the stairs. Fix any carpet that is loose or worn. What can I do on the outside of my home? Use bright outdoor lighting. Repair the  edges of walkways and driveways and fix any cracks. Clear paths of anything that can make you trip, such as tools or rocks. Add color or contrast paint or tape to clearly mark and help you see high doorway thresholds. Trim any bushes or trees on the main path into your home. Check that handrails are securely fastened and in good repair. Both sides of all steps should have handrails. Install guardrails along the edges of any raised decks or porches. Have leaves, snow, and ice cleared regularly. Use sand, salt, or ice melt on walkways during winter months  if you live where there is ice and snow. In the garage, clean up any spills right away, including grease or oil spills. What other actions can I take? Review your medicines with your health care provider. Some medicines can make you confused or feel dizzy. This can increase your chance of falling. Wear closed-toe shoes that fit well and support your feet. Wear shoes that have rubber soles and low heels. Use a cane, walker, scooter, or crutches that help you move around if needed. Talk with your provider about other ways that you can decrease your risk of falls. This may include seeing a physical therapist to learn to do exercises to improve movement and strength. Where to find more information Centers for Disease Control and Prevention, [employer]: TonerPromos.no [employer] on Aging: BaseRingTones.pl National Institute on Aging: BaseRingTones.pl Contact a health care provider if: You are afraid of falling at home. You feel weak, drowsy, or dizzy at home. You fall at home. Get help right away if you: Lose consciousness or have trouble moving after a fall. Have a fall that causes a head injury. These symptoms may be an emergency. Get help right away. Call 911. Do not wait to see if the symptoms will go away. Do not drive yourself to the hospital. This information is not intended to replace advice given to you by your health care provider. Make sure you  discuss any questions you have with your health care provider. Document Revised: 01/02/2022 Document Reviewed: 01/02/2022 Elsevier Patient Education  2024 [employer].

## 2023-12-12 NOTE — Progress Notes (Signed)
 Subjective:   Brandy [name] is a 68 y.o. who presents for a Medicare Wellness preventive visit.  As a reminder, Annual Wellness Visits don't include a physical exam, and some assessments may be limited, especially if this visit is performed virtually. We may recommend an in-person follow-up visit with your provider if needed.  Visit Complete: Virtual I connected with  [place] ONEIDA Ada on 12/12/23 by a audio enabled telemedicine application and verified that I am speaking with the correct person using two identifiers.  Patient Location: Other:  work  Arts administrator  I discussed the limitations of evaluation and management by telemedicine. The patient expressed understanding and agreed to proceed.  Vital Signs: Because this visit was a virtual/telehealth visit, some criteria may be missing or patient reported. Any vitals not documented were not able to be obtained and vitals that have been documented are patient reported.  VideoDeclined- This patient declined Librarian, academic. Therefore the visit was completed with audio only.  Persons Participating in Visit: Patient.  AWV Questionnaire: No: Patient Medicare AWV questionnaire was not completed prior to this visit.  Cardiac Risk Factors include: advanced age (>20men, >84 women);diabetes mellitus;dyslipidemia;obesity (BMI >30kg/m2)     Objective:    Today's Vitals   12/12/23 1437  Weight: 227 lb (103 kg)  Height: 5' 7 (1.702 m)   Body mass index is 35.55 kg/m.     12/12/2023    2:56 PM 02/12/2023   11:18 AM 02/07/2023    7:31 AM 02/07/2023    7:30 AM 12/06/2022    3:41 PM 09/26/2021    3:46 PM 04/18/2021    1:15 PM  Advanced Directives  Does Patient Have a Medical Advance Directive? Yes No Yes No Yes Yes No  Type of Estate agent of [place];Living will    Healthcare Power of [place];Living will Healthcare Power of [place];Living will   Does patient want to make  changes to medical advance directive? No - Patient declined    No - Patient declined    Copy of Healthcare Power of Attorney in Chart? Yes - validated most recent copy scanned in chart (See row information)    Yes - validated most recent copy scanned in chart (See row information) Yes - validated most recent copy scanned in chart (See row information)   Would patient like information on creating a medical advance directive?  No - Patient declined         Current Medications (verified) Outpatient Encounter Medications as of 12/12/2023  Medication Sig   CALCIUM  PO Take by mouth daily.   Cholecalciferol  (VITAMIN D3) 1000 units CAPS Take 1,000 Units by mouth 2 (two) times daily.   dapagliflozin  propanediol (FARXIGA ) 10 MG TABS tablet TAKE 1 TABLET DAILY BEFORE BREAKFAST   furosemide  (LASIX ) 20 MG tablet Take 1 tablet (20 mg total) by mouth daily as needed for fluid or edema.   glucose blood (ONETOUCH VERIO) test strip USE 1 STRIP TO CHECK GLUCOSE UP TO 4 TIMES DAILY AS DIRECTED   Lancets (ONETOUCH DELICA PLUS LANCET33G) MISC USE  1 TO CHECK GLUCOSE UP TO 4 TIMES DAILY AS DIRECTED   Lavender Oil OIL Apply 1 application topically daily as needed (stress).   Lemon Oil OIL Take 1 application by mouth daily as needed (add to water ).   Magnesium Bisglycinate (MAG GLYCINATE) 100 MG TABS Take 270 mg by mouth in the morning, at noon, and at bedtime. (Patient taking differently: Take 270 mg by  mouth in the morning, at noon, and at bedtime. Takes 1 in the morning and 1 in the evening)   MAGNESIUM CITRATE PO Take 1 tablet by mouth daily. (Patient taking differently: Take 1 tablet by mouth daily. Takes 1 tablet in the afternoon)   metFORMIN  (GLUCOPHAGE -XR) 500 MG 24 hr tablet Take 1/2 (one-half) tablet by mouth twice daily (Patient taking differently: Take 1/2 (one-half) tablet by mouth twice daily Takes 1 tablet once a day)   metoprolol  tartrate (LOPRESSOR ) 50 MG tablet Take 1/2 (one-half) tablet by mouth twice  daily   Multiple Vitamins-Minerals (MULTIVITAMIN WITH MINERALS) tablet Take 1 tablet by mouth daily.   OIL OF OREGANO PO Uses as needed   peppermint oil liquid Apply 1 application topically daily as needed (itchy/ cold symptoms).   Tea Tree Oil OIL Apply 1 application topically daily as needed (fungus).   Turmeric 500 MG CAPS Take by mouth.   vitamin B-12 (CYANOCOBALAMIN) 500 MCG tablet Take 500 mcg by mouth daily.   vitamin C (ASCORBIC ACID) 500 MG tablet Take 500 mg by mouth daily.    vitamin E 45 MG (100 UNITS) capsule Take by mouth daily.   Celery Seed OIL Using as needed to help with constipation (Patient not taking: Reported on 12/12/2023)   Lemongrass Oil OIL Apply 1 application topically daily as needed (multiple uses). (Patient not taking: Reported on 12/12/2023)   Oil Base OIL Breathe oil as needed for congestion (Patient not taking: Reported on 12/12/2023)   No facility-administered encounter medications on file as of 12/12/2023.    Allergies (verified) Patient has no known allergies.   History: Past Medical History:  Diagnosis Date   Acute lower GI bleeding 01/19/2020   Arthritis    Atrial fibrillation with rapid ventricular response (HCC)    Chicken pox    Chronic systolic CHF (congestive heart failure) (HCC)    a. 03/2016 Echo: Ef 15-20%, diff HK, ant AK;  b. 05/2016 Echo: Ef 30-35%, diff HK, mildly dil LA/RA.   Diabetes mellitus without complication (HCC)    Dysrhythmia    GERD (gastroesophageal reflux disease)    Heart murmur    Hip discomfort    been going on for 40 years    History of kidney stones    Hyperlipidemia associated with type 2 diabetes mellitus (HCC) 05/12/2021   Hypertension    Moderate mitral regurgitation    a. 03/2016 Echo: mod MR in setting of LV dysfxn.   Motion sickness    car - back seat   NICM (nonischemic cardiomyopathy) (HCC)    a. 03/2016 Echo: EF 15-20%, diff HK, ant AK, mod MR, mod dil LA, mildly dil RA;  b. 04/2016 Cath: nl cors;  c.  05/2016 Echo: EF 30-35%, diff HK.   Obesity    Obstructive sleep apnea    compliant with CPAP   Persistent atrial fibrillation (HCC)    a. Dx 03/2016;  b. CHA2DS2VASc = 2-->Eliquis  5mg  BID;  b. 05/2016 Failed DCCV x 4.   Sleep apnea    Stomach irritation    Visit for monitoring Tikosyn  therapy 07/24/2016   Past Surgical History:  Procedure Laterality Date   ATRIAL FIBRILLATION ABLATION N/A 12/09/2020   Procedure: ATRIAL FIBRILLATION ABLATION;  Surgeon: Cindie Ole DASEN, MD;  Location: [facility] INVASIVE CV LAB;  Service: Cardiovascular;  Laterality: N/A;   BIOPSY N/A 01/12/2020   Procedure: BIOPSY;  Surgeon: Janalyn Keene NOVAK, MD;  Location: [facility] SURGERY CNTR;  Service: Endoscopy;  Laterality: N/A;  CARDIAC CATHETERIZATION N/A 04/17/2016   Procedure: Left Heart Cath and Coronary Angiography;  Surgeon: Deatrice DELENA Cage, MD;  Location: [facility] INVASIVE CV LAB;  Service: Cardiovascular;  Laterality: N/A;   CARDIOVERSION N/A 10/22/2020   Procedure: CARDIOVERSION;  Surgeon: Mady Bruckner, MD;  Location: [facility] ORS;  Service: Cardiovascular;  Laterality: N/A;   CHOLECYSTECTOMY     COLON SURGERY  2021   COLONOSCOPY N/A 01/20/2020   Procedure: COLONOSCOPY;  Surgeon: Maryruth Ole DASEN, MD;  Location: [facility] ENDOSCOPY;  Service: Endoscopy;  Laterality: N/A;   COLONOSCOPY WITH PROPOFOL  N/A 01/12/2020   Procedure: COLONOSCOPY WITH PROPOFOL ;  Surgeon: Janalyn Keene NOVAK, MD;  Location: [facility];  Service: Endoscopy;  Laterality: N/A;  Diabetic - oral meds sleep apnea   ELECTROPHYSIOLOGIC STUDY N/A 05/26/2016   Procedure: Cardioversion;  Surgeon: Deatrice DELENA Cage, MD;  Location: [facility] ORS;  Service: Cardiovascular;  Laterality: N/A;   ESOPHAGOGASTRODUODENOSCOPY (EGD) WITH PROPOFOL  N/A 01/12/2020   Procedure: ESOPHAGOGASTRODUODENOSCOPY (EGD) WITH PROPOFOL ;  Surgeon: Janalyn Keene NOVAK, MD;  Location: [facility] SURGERY [facility];  Service: Endoscopy;  Laterality: N/A;   POLYPECTOMY N/A 01/12/2020    Procedure: POLYPECTOMY;  Surgeon: Janalyn Keene NOVAK, MD;  Location: [facility] SURGERY CNTR;  Service: Endoscopy;  Laterality: N/A;   Family History  Problem Relation Age of Onset   COPD Mother    Anxiety disorder Mother    Arthritis Mother    Asthma Mother    Depression Mother    COPD Father    Heart disease Father    Hypertension Father    Arthritis Father    Varicose Veins Father    Diabetes Paternal Aunt    Cancer Maternal Aunt    Cancer Maternal Aunt    Social History   Socioeconomic History   Marital status: Widowed    Spouse name: Deceased   Number of children: 1   Years of education: Not on file   Highest education level: Bachelor's degree (e.g., BA, AB, BS)  Occupational History   Not on file  Tobacco Use   Smoking status: Never    Passive exposure: Never   Smokeless tobacco: Never  Vaping Use   Vaping status: Never Used  Substance and Sexual Activity   Alcohol use: Never   Drug use: Never   Sexual activity: Not Currently    Partners: Male  Other Topics Concern   Not on file  Social History Narrative   1 by birth 2 step children   Pt lives alone   Social Drivers of Corporate investment banker Strain: Low Risk  (12/12/2023)   Overall Financial Resource Strain (CARDIA)    Difficulty of Paying Living Expenses: Not hard at all  Food Insecurity: No Food Insecurity (12/12/2023)   Hunger Vital Sign    Worried About Running Out of Food in the Last Year: Never true    Ran Out of Food in the Last Year: Never true  Recent Concern: Food Insecurity - Food Insecurity Present (11/26/2023)   Hunger Vital Sign    Worried About Running Out of Food in the Last Year: Sometimes true    Ran Out of Food in the Last Year: Never true  Transportation Needs: No Transportation Needs (12/12/2023)   PRAPARE - Administrator, Civil Service (Medical): No    Lack of Transportation (Non-Medical): No  Physical Activity: Insufficiently Active (12/12/2023)   Exercise Vital  Sign    Days of Exercise per Week: 2 days    Minutes of Exercise per  Session: 10 min  Stress: Stress Concern Present (12/12/2023)   [employer] of Occupational Health - Occupational Stress Questionnaire    Feeling of Stress: Very much  Social Connections: Moderately Integrated (12/12/2023)   Social Connection and Isolation Panel    Frequency of Communication with Friends and Family: Three times a week    Frequency of Social Gatherings with Friends and Family: More than three times a week    Attends Religious Services: More than 4 times per year    Active Member of Clubs or Organizations: Yes    Attends Banker Meetings: More than 4 times per year    Marital Status: Widowed    Tobacco Counseling Counseling given: Not Answered    Clinical Intake:  Pre-visit preparation completed: Yes  Pain : No/denies pain     BMI - recorded: 35.55 Nutritional Status: BMI > 30  Obese Nutritional Risks: None Diabetes: Yes CBG done?: No (FBS 175 per patient) Did pt. bring in CBG monitor from home?: No  Lab Results  Component Value Date   HGBA1C 6.1 (A) 09/14/2023   HGBA1C 5.9 (H) 05/18/2023   HGBA1C 5.5 12/20/2022     How often do you need to have someone help you when you read instructions, pamphlets, or other written materials from your doctor or pharmacy?: 1 - Never  Interpreter Needed?: No  Information entered by :: Vina Ned, CMA   Activities of Daily Living     12/12/2023    2:41 PM  In your present state of health, do you have any difficulty performing the following activities:  Hearing? 1  Comment sometimes, referral placed for ENT evaluation  Vision? 0  Difficulty concentrating or making decisions? 0  Walking or climbing stairs? 1  Comment uses cane if walking a lot  Dressing or bathing? 0  Doing errands, shopping? 0  Preparing Food and eating ? N  Using the Toilet? N  In the past six months, have you accidently leaked urine? Y  Comment wears  panty liner  Do you have problems with loss of bowel control? Y  Comment sometimes  Managing your Medications? N  Managing your Finances? N  Housekeeping or managing your Housekeeping? N    Patient Care Team: Justus Leita DEL, MD as PCP - General (Internal Medicine) Fernande Elspeth BROCKS, MD as Consulting Physician (Cardiology) Broadus Bare, OD (Optometry) Royal Selinda BIRCH, PT as Physical Therapist (Physical Therapy) Alvia Selinda PARAS, MD as Consulting Physician (Family Medicine) Kennyth Chew, MD as Consulting Physician (Cardiology) Clovia Marsh, MD as Referring Physician (Chiropractic Medicine)  I have updated your Care Teams any recent Medical Services you may have received from other providers in the past year.     Assessment:   This is a routine wellness examination for [facility].  Hearing/Vision screen Hearing Screening - Comments:: Hearing loss, referral to ENT for evaluation Vision Screening - Comments:: Gets DM eye exams, Dr. Bare Broadus, [place]   Goals Addressed             This Visit's Progress    Patient Stated       Be able to go for a walk without pain       Depression Screen     12/12/2023    2:53 PM 12/07/2023    9:09 AM 09/14/2023    9:50 AM 05/18/2023    9:41 AM 12/20/2022    7:58 AM 12/06/2022    3:39 PM 09/06/2022   10:35 AM  PHQ 2/9 Scores  PHQ - 2 Score 2 0 0 0 2 2 2   PHQ- 9 Score 6   0 8 4 8     Fall Risk     12/12/2023    2:58 PM 12/07/2023    9:09 AM 09/14/2023    9:49 AM 05/21/2023    4:01 PM 05/18/2023    9:41 AM  Fall Risk   Falls in the past year? 0 0 0 0 0  Number falls in past yr: 0 0 0 0 0  Injury with Fall? 0 0 0 0 0  Risk for fall due to : Impaired balance/gait;Orthopedic patient;Impaired mobility No Fall Risks  No Fall Risks No Fall Risks  Follow up Falls evaluation completed;Education provided Falls evaluation completed  Falls evaluation completed Falls evaluation completed    MEDICARE RISK AT HOME:  Medicare Risk at  Home Any stairs in or around the home?: Yes If so, are there any without handrails?: No Home free of loose throw rugs in walkways, pet beds, electrical cords, etc?: No Adequate lighting in your home to reduce risk of falls?: Yes Life alert?: No Use of a cane, walker or w/c?: Yes (cane prn) Grab bars in the bathroom?: Yes Shower chair or bench in shower?: Yes Elevated toilet seat or a handicapped toilet?: No  TIMED UP AND GO:  Was the test performed?  No  Cognitive Function: 6CIT completed        12/12/2023    2:59 PM 12/06/2022    3:43 PM  6CIT Screen  What Year? 0 points 0 points  What month? 0 points 0 points  What time? 0 points 0 points  Count back from 20 0 points 0 points  Months in reverse 0 points 0 points  Repeat phrase 0 points 2 points  Total Score 0 points 2 points    Immunizations Immunization History  Administered Date(s) Administered   PFIZER(Purple Top)SARS-COV-2 Vaccination 02/06/2020, 02/27/2020, 07/29/2020   PNEUMOCOCCAL CONJUGATE-20 02/22/2021   Pneumococcal Conjugate-13 10/24/2019   Td 12/10/2001   Td (Adult), 2 Lf Tetanus Toxid, Preservative Free 12/10/2001   Zoster Recombinant(Shingrix ) 06/10/2021, 11/07/2021    Screening Tests Health Maintenance  Topic Date Due   DTaP/Tdap/Td (2 - Tdap) 12/11/2011   COVID-19 Vaccine (4 - 2024-25 season) 01/14/2023   INFLUENZA VACCINE  12/14/2023   OPHTHALMOLOGY EXAM  03/08/2024   HEMOGLOBIN A1C  03/16/2024   Diabetic kidney evaluation - eGFR measurement  05/17/2024   Diabetic kidney evaluation - Urine ACR  05/17/2024   FOOT EXAM  05/17/2024   Colonoscopy  05/31/2024   Medicare Annual Wellness (AWV)  12/11/2024   MAMMOGRAM  05/13/2025   DEXA SCAN  05/13/2028   Pneumococcal Vaccine: 50+ Years  Completed   Hepatitis C Screening  Completed   Zoster Vaccines- Shingrix   Completed   Hepatitis B Vaccines  Aged Out   HPV VACCINES  Aged Out   Meningococcal B Vaccine  Aged Out    Health  Maintenance  Health Maintenance Due  Topic Date Due   DTaP/Tdap/Td (2 - Tdap) 12/11/2011   COVID-19 Vaccine (4 - 2024-25 season) 01/14/2023   Health Maintenance Items Addressed: See Nurse Notes at the end of this note  Additional Screening:  Vision Screening: Recommended annual ophthalmology exams for early detection of glaucoma and other disorders of the eye. Would you like a referral to an eye doctor? No    Dental Screening: Recommended annual dental exams for proper oral hygiene  Community Resource Referral / Chronic Care Management: CRR  required this visit?  No   CCM required this visit?  No   Plan:    I have personally reviewed and noted the following in the patient's chart:   Medical and social history Use of alcohol, tobacco or illicit drugs  Current medications and supplements including opioid prescriptions. Patient is not currently taking opioid prescriptions. Functional ability and status Nutritional status Physical activity Advanced directives List of other physicians Hospitalizations, surgeries, and ER visits in previous 12 months Vitals Screenings to include cognitive, depression, and falls Referrals and appointments  In addition, I have reviewed and discussed with patient certain preventive protocols, quality metrics, and best practice recommendations. A written personalized care plan for preventive services as well as general preventive health recommendations were provided to patient.   Vina Ned, CMA   12/12/2023   After Visit Summary: (MyChart) Due to this being a telephonic visit, the after visit summary with patients personalized plan was offered to patient via MyChart   Notes:  FBS this morning 175 per patient Needs Tdap vaccine (pharmacy) Placed referral to DM & nutrition education Placed referral to [facility] ENT for patient reported hearing loss. Declined covid vaccines

## 2023-12-14 ENCOUNTER — Other Ambulatory Visit: Payer: Self-pay | Admitting: Internal Medicine

## 2023-12-14 DIAGNOSIS — E118 Type 2 diabetes mellitus with unspecified complications: Secondary | ICD-10-CM

## 2023-12-14 NOTE — Telephone Encounter (Signed)
 Requested Prescriptions  Pending Prescriptions Disp Refills   metoprolol  tartrate (LOPRESSOR ) 50 MG tablet [Pharmacy Med Name: Metoprolol  Tartrate 50 MG Oral Tablet] 90 tablet 0    Sig: Take 1/2 (one-half) tablet by mouth twice daily     Cardiovascular:  Beta Blockers Passed - 12/14/2023  2:48 PM      Passed - Last BP in normal range    BP Readings from Last 1 Encounters:  12/07/23 112/60         Passed - Last Heart Rate in normal range    Pulse Readings from Last 1 Encounters:  12/07/23 (!) 58         Passed - Valid encounter within last 6 months    Recent Outpatient Visits           1 week ago Greater trochanteric pain syndrome of both lower extremities   [place] [facility], Selinda PARAS, MD   2 weeks ago Greater trochanteric pain syndrome of both lower extremities   [place] [facility] at [facility], Selinda PARAS, MD   3 months ago Type II diabetes mellitus with complication [facility])   [place] [facility] at [facility], Leita DEL, MD       Future Appointments             In 5 months Ku, Selinda PARAS, MD [place] [facility] Primary Care & Sports Medicine at [facility], [facility]

## 2023-12-17 ENCOUNTER — Telehealth: Payer: Self-pay

## 2023-12-17 NOTE — Telephone Encounter (Signed)
 Spoke with patient. Reviewed AF with fast heart rates.  She confirms she is not on OAC at present.   She states she definitely felt the fast heart rates over the weekend. Reports being under extreme amounts of stress and also had 2 recent cortisone injections which may have exacerbated.    She is not wanting to take an [facility] but is willing to come in and talk with our providers around risks v/s benefits and need for stroke/clot prevention.   We did review risks of delaying/not starting an OAC at present, patient is willing to accept the risks.  States she is wanting to work on her stress management and lifestyle.  Reviewed stroke risk of not being on an OAC.     Patient verbalizes understanding and will come to appointment on Wednesday to discuss further with APP.  In meantime, she will take it easy and refrain from any form of stimulants and added stress.

## 2023-12-17 NOTE — Telephone Encounter (Signed)
 Alert received from [employer] for AF in progress from 8/2 @ 19:53, not always good rate control, Eliquis  per EPIC notes, not on MAR.  [facility] checked and as of this am 12/17/23 presenting rhythm was AF.   Per previous noted patient has paroxysmal AF and not on OAC. Was agreeable for in office visit with provider if another AF event occurred. Did not want to see AF clinic at that time.   Attempted to contact patient. No answer, left message to call back.

## 2023-12-17 NOTE — Telephone Encounter (Signed)
 Requested Prescriptions  Pending Prescriptions Disp Refills   Lancets (ONETOUCH DELICA PLUS LANCET33G) MISC [Pharmacy Med Name: ONETOUCH DELICA LAN 33G MIS] 400 each 0    Sig: USE 1  TO CHECK GLUCOSE 4 TIMES DAILY AS DIRECTED     Endocrinology: Diabetes - Testing Supplies Passed - 12/17/2023  8:47 AM      Passed - Valid encounter within last 12 months    Recent Outpatient Visits           1 week ago Greater trochanteric pain syndrome of both lower extremities   [place] [facility], Selinda PARAS, MD   3 weeks ago Greater trochanteric pain syndrome of both lower extremities   [place] [facility], Selinda PARAS, MD   3 months ago Type II diabetes mellitus with complication [facility])   [place] [facility] at [facility], Leita DEL, MD       Future Appointments             In 5 months Ku, Selinda PARAS, MD [facility] Primary Care & Sports Medicine at [facility], [facility]             glucose blood [facility] VERIO) test strip [place] Med Name: OneTouch Verio In Vitro Strip] 400 each 0    Sig: USE 1 STRIP TO CHECK GLUCOSE 4 TIMES DAILY AS  DIRECTED     Endocrinology: Diabetes - Testing Supplies Passed - 12/17/2023  8:47 AM      Passed - Valid encounter within last 12 months    Recent Outpatient Visits           1 week ago Greater trochanteric pain syndrome of both lower extremities   [place] [facility], Selinda PARAS, MD   3 weeks ago Greater trochanteric pain syndrome of both lower extremities   [place] [facility] at [facility], Selinda PARAS, MD   3 months ago Type II diabetes mellitus with complication [facility])   [place] [facility] Medicine at [facility], Leita DEL, MD       Future Appointments             In 5 months Ku, Selinda PARAS, MD [facility]  Health Primary Care & Sports Medicine at [facility], [facility]

## 2023-12-17 NOTE — Telephone Encounter (Signed)
Pt called back returning call.

## 2023-12-17 NOTE — Telephone Encounter (Signed)
Attempted to contact patient. No answer, left message to call back

## 2023-12-18 NOTE — H&P (View-Only) (Signed)
 Cardiology Office Note Date:  12/18/2023  Patient ID:  Brandy, [name] 12/04/1955, MRN [id number] PCP:  Brandy [name] DEL, [name]  Cardiologist/Electrophysiologist: Dr. Fernande AFib ablation: Dr. Cindie 2022    Chief Complaint:   discuss OAC  History of Present Illness: Brandy [name] is a 68 y.o. female with history of  NICM with improvement in EF with rate control AFib,  DM, OSA w/CPAP.  She saw Dr. Fernande 02/2023 Reported watch HRs 150's while working in the yard Had ILR to monitor burden > guide OAC Discussed increased DOE > perhaps HFpEF> started Farxiga  Described pause identified on 9/25 occurred in the context of pain and vomiting with a kidney stone; still struggling with urinary urgency.  BP not longer an issue (historically hypotension)  Advised to come in to discuss a/c recommendations with ILR alert for AFib alert  NOTE; 2022  BOTH of her LGIB occurred s/p polyp removal after restart of her a/c. The 1st about 4-5 days after the procedure and a couple days after restart of her eliquis  The 2nd almost 2 weeks after the procedure and a week or so after restart of Eliquis , though (?was a large polyp and she says the GI [name] did mention that bringing the edges together was incomplete given the size) And she was weary to resume OAC then   TODAY  She does have a sense of a bit of unease perhaps In her chest  No CP, no overt palpitations She does feel lie in AFib she gets winded easier, more tired. No near syncope or syncope   AFib Historically had been described as permanent  Did undergo ultimately AFib ablation 12/09/2020 (Dr. Cindie)   Past Medical History:  Diagnosis Date   Acute lower GI bleeding 01/19/2020   Arthritis    Atrial fibrillation with rapid ventricular response (HCC)    Chicken pox    Chronic systolic CHF (congestive heart failure) (HCC)    a. 03/2016 Echo: Ef 15-20%, diff HK, ant AK;  Brandy [name]. 05/2016 Echo: Ef 30-35%, diff HK, mildly dil LA/RA.   Diabetes  mellitus without complication (HCC)    Dysrhythmia    GERD (gastroesophageal reflux disease)    Heart murmur    Hip discomfort    been going on for 40 years    History of kidney stones    Hyperlipidemia associated with type 2 diabetes mellitus (HCC) 05/12/2021   Hypertension    Moderate mitral regurgitation    a. 03/2016 Echo: mod MR in setting of LV dysfxn.   Motion sickness    car - back seat   NICM (nonischemic cardiomyopathy) (HCC)    a. 03/2016 Echo: EF 15-20%, diff HK, ant AK, mod MR, mod dil LA, mildly dil RA;  Brandy [name]. 04/2016 Cath: nl cors;  c. 05/2016 Echo: EF 30-35%, diff HK.   Obesity    Obstructive sleep apnea    compliant with CPAP   Persistent atrial fibrillation (HCC)    a. Dx 03/2016;  Brandy [name]. CHA2DS2VASc = 2-->Eliquis  5mg  BID;  Brandy [name]. 05/2016 Failed DCCV x 4.   Sleep apnea    Stomach irritation    Visit for monitoring Tikosyn  therapy 07/24/2016    Past Surgical History:  Procedure Laterality Date   ATRIAL FIBRILLATION ABLATION N/A 12/09/2020   Procedure: ATRIAL FIBRILLATION ABLATION;  Surgeon: Brandy [name] ONEIDA, [name];  Location: [facility];  Service: Cardiovascular;  Laterality: N/A;   BIOPSY N/A 01/12/2020   Procedure: BIOPSY;  Surgeon: Brandy [name], Brandy [name]  Brandy [name], [name];  Location: [facility];  Service: Endoscopy;  Laterality: N/A;   CARDIAC CATHETERIZATION N/A 04/17/2016   Procedure: Left Heart Cath and Coronary Angiography;  Surgeon: Brandy DELENA Cage, [name];  Location: [facility];  Service: Cardiovascular;  Laterality: N/A;   CARDIOVERSION N/A 10/22/2020   Procedure: CARDIOVERSION;  Surgeon: Brandy Bruckner, [name];  Location: [facility];  Service: Cardiovascular;  Laterality: N/A;   CHOLECYSTECTOMY     COLON SURGERY  2021   COLONOSCOPY N/A 01/20/2020   Procedure: COLONOSCOPY;  Surgeon: Brandy [name] DASEN, [name];  Location: [facility];  Service: Endoscopy;  Laterality: N/A;   COLONOSCOPY WITH PROPOFOL  N/A 01/12/2020   Procedure: COLONOSCOPY WITH PROPOFOL ;  Surgeon:  Brandy Keene NOVAK, [name];  Location: [facility];  Service: Endoscopy;  Laterality: N/A;  Diabetic - oral meds sleep apnea   ELECTROPHYSIOLOGIC STUDY N/A 05/26/2016   Procedure: Cardioversion;  Surgeon: Brandy DELENA Cage, [name];  Location: [facility];  Service: Cardiovascular;  Laterality: N/A;   ESOPHAGOGASTRODUODENOSCOPY (EGD) WITH PROPOFOL  N/A 01/12/2020   Procedure: ESOPHAGOGASTRODUODENOSCOPY (EGD) WITH PROPOFOL ;  Surgeon: Brandy Keene NOVAK, [name];  Location: [facility];  Service: Endoscopy;  Laterality: N/A;   POLYPECTOMY N/A 01/12/2020   Procedure: POLYPECTOMY;  Surgeon: Brandy Keene NOVAK, [name];  Location: [facility];  Service: Endoscopy;  Laterality: N/A;    Current Outpatient Medications  Medication Sig Dispense Refill   CALCIUM  PO Take by mouth daily.     Celery Seed OIL Using as needed to help with constipation (Patient not taking: Reported on 12/12/2023)     Cholecalciferol  (VITAMIN D3) 1000 units CAPS Take 1,000 Units by mouth 2 (two) times daily.     dapagliflozin  propanediol (FARXIGA ) 10 MG TABS tablet TAKE 1 TABLET DAILY BEFORE BREAKFAST 90 tablet 0   furosemide  (LASIX ) 20 MG tablet Take 1 tablet (20 mg total) by mouth daily as needed for fluid or edema. 30 tablet 0   glucose blood (ONETOUCH VERIO) test strip USE 1 STRIP TO CHECK GLUCOSE 4 TIMES DAILY AS  DIRECTED 400 each 0   Lancets (ONETOUCH DELICA PLUS LANCET33G) MISC USE 1  TO CHECK GLUCOSE 4 TIMES DAILY AS DIRECTED 400 each 0   Lavender Oil OIL Apply 1 application topically daily as needed (stress).     Lemon Oil OIL Take 1 application by mouth daily as needed (add to water ).     Lemongrass Oil OIL Apply 1 application topically daily as needed (multiple uses). (Patient not taking: Reported on 12/12/2023)     Magnesium Bisglycinate (MAG GLYCINATE) 100 MG TABS Take 270 mg by mouth in the morning, at noon, and at bedtime. (Patient taking differently: Take 270 mg by mouth in the morning, at noon, and at bedtime.  Takes 1 in the morning and 1 in the evening)     MAGNESIUM CITRATE PO Take 1 tablet by mouth daily. (Patient taking differently: Take 1 tablet by mouth daily. Takes 1 tablet in the afternoon)     metFORMIN  (GLUCOPHAGE -XR) 500 MG 24 hr tablet Take 1/2 (one-half) tablet by mouth twice daily (Patient taking differently: Take 1/2 (one-half) tablet by mouth twice daily Takes 1 tablet once a day) 90 tablet 3   metoprolol  tartrate (LOPRESSOR ) 50 MG tablet Take 1/2 (one-half) tablet by mouth twice daily 90 tablet 0   Multiple Vitamins-Minerals (MULTIVITAMIN WITH MINERALS) tablet Take 1 tablet by mouth daily.     Oil Base OIL Breathe oil as needed for congestion (Patient not taking: Reported on 12/12/2023)  OIL OF OREGANO PO Uses as needed     peppermint oil liquid Apply 1 application topically daily as needed (itchy/ cold symptoms).     Tea Tree Oil OIL Apply 1 application topically daily as needed (fungus).     Turmeric 500 MG CAPS Take by mouth.     vitamin Brandy [name]-12 (CYANOCOBALAMIN) 500 MCG tablet Take 500 mcg by mouth daily.     vitamin C (ASCORBIC ACID) 500 MG tablet Take 500 mg by mouth daily.      vitamin E 45 MG (100 UNITS) capsule Take by mouth daily.     No current facility-administered medications for this visit.    Allergies:   Patient has no known allergies.   Social History:  The patient  reports that she has never smoked. She has never been exposed to tobacco smoke. She has never used smokeless tobacco. She reports that she does not drink alcohol and does not use drugs.   Family History:  The patient's family history includes Anxiety disorder in her mother; Arthritis in her father and mother; Asthma in her mother; COPD in her father and mother; Cancer in her maternal aunt and maternal aunt; Depression in her mother; Diabetes in her paternal aunt; Heart disease in her father; Hypertension in her father; Varicose Veins in her father.  ROS:  Please see the history of present illness.    All  other systems are reviewed and otherwise negative.   PHYSICAL EXAM:  VS:  There were no vitals taken for this visit. BMI: There is no height or weight on file to calculate BMI. Well nourished, well developed, in no acute distress HEENT: normocephalic, atraumatic Neck: no JVD, carotid bruits or masses Cardiac: irreg-irreg; no significant murmurs, no rubs, or gallops Lungs: CTA Brandy [name]/l, no wheezing, rhonchi or rales Abd: soft, nontender MS: no deformity or atrophy Ext: no edema Skin: warm and dry, no rash Neuro:  No gross deficits appreciated Psych: euthymic mood, full affect   EKG: done today and reviewed by myself AFib 91bpm   10/28/21: TTE 1. Left ventricular ejection fraction, by estimation, is 55 to 60%. The  left ventricle has normal function. The left ventricle has no regional  wall motion abnormalities. Left ventricular diastolic parameters were  normal. The average left ventricular  global longitudinal strain is -15.0 %.   2. Right ventricular systolic function is normal. The right ventricular  size is normal. There is normal pulmonary artery systolic pressure. The  estimated right ventricular systolic pressure is 26.5 mmHg.   3. Left atrial size was borderline mildly dilated.   4. The mitral valve is normal in structure. Mild mitral valve  regurgitation. No evidence of mitral stenosis.   5. The aortic valve is tricuspid. Aortic valve regurgitation is not  visualized. No aortic stenosis is present.   6. The inferior vena cava is normal in size with greater than 50%  respiratory variability, suggesting right atrial pressure of 3 mmHg.    12/09/2020: EPS/ablation CONCLUSIONS: 1. Successful PVI 2. Successful ablation/isolation of the posterior wall 3. Successful ablation of complex fractional atrial electrograms outside the LAA and LSPV 4. Intracardiac echo reveals trivial pericardial effusion, 4 pulmonary veins, normal LV function 5. No early apparent complications. 6.  Colchicine  0.6mg  PO BID x 5 days 7. Protonix  40mg  PO daily for 45 days   12/31/2018; TTE IMPRESSIONS   1. The left ventricle has normal systolic function with an ejection  fraction of 60-65%. The cavity size was normal. There is mildly  increased  left ventricular wall thickness. Left ventricular diastolic function could  not be evaluated secondary to atrial   fibrillation. No evidence of left ventricular regional wall motion  abnormalities.   2. The right ventricle has normal systolic function. The cavity was  normal. There is no increase in right ventricular wall thickness. Right  ventricular systolic pressure is upper normal to mildly elevated (25-30  mmHg plus central venous pressure).   3. Right atrial size was mildly dilated.   4. The aortic valve is tricuspid.   5. The aorta is normal unless otherwise noted.   6. The interatrial septum was not well visualized.     04/17/2016; LHC LV end diastolic pressure is normal.   1. Normal coronary arteries. 2. Severely dilated left ventricle with Normal left ventricular end-diastolic pressure. Left ventricular angiography was not performed. EF was severely dilated by echo.   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 Continue medical therapy. I am going to start anticoagulation with Eliquis  this evening. If ventricular rate is controlled, the patient can be discharged home with rate control and anticoagulation with plans for outpatient cardioversion in 3 weeks. Otherwise, if the rate is not controlled, recommend TEE guided cardioversion tomorrow.    Recent Labs: 05/18/2023: ALT 22; BUN 16; Creatinine, Ser 0.91; Hemoglobin 14.4; Platelets 276; Potassium 4.6; Sodium 141; TSH 2.260  05/18/2023: Chol/HDL Ratio 4.4; Cholesterol, Total 191; HDL 43; LDL Chol Calc (NIH) 120; Triglycerides 157   CrCl cannot be calculated (Patient's most recent lab result is older than the maximum 21  days allowed.).   Wt Readings from Last 3 Encounters:  12/12/23 227 lb (103 kg)  12/07/23 232 lb 6.4 oz (105.4 kg)  11/26/23 232 lb 9.6 oz (105.5 kg)     Other studies reviewed: Additional studies/records reviewed today include: summarized above  ASSESSMENT AND PLAN:  1. Longstanding Persistent AFib     CHA2DS2Vasc is 44 (age, DM, NICM (hx) and gender)  Ongoing episode since 12/16/23 Rates not well controlled in AFib   She is agreeable to resume OAC, no recent c=surgeries, colonoscpies, procedures Start Eliquis  5mg  BID Will increase her lopressor  to 50mg  BID to improve rates Plan DCCV after 3 weeks of uninterrupted a/c, we discussed this, she understands importance  She suspects recent cortisone shots may have provoked the AFib Hoping to avoid AADs and procedures  Discussed rate/rhythm control given prior CM is important  Plan loop check day prior to her DCCV in her hopes that she will spontaneously convert and not require it   2. NICM     Felt tachy-mediated     Normalization of her EF by last echo 2023  3. Hypotension     Historically has been on midodrine  for low BPs     Does not seem an ongoing issue   Informed Consent   Shared Decision Making/Informed Consent{ The risks (stroke, cardiac arrhythmias rarely resulting in the need for a temporary or permanent pacemaker, skin irritation or burns and complications associated with conscious sedation including aspiration, arrhythmia, respiratory failure and death), benefits (restoration of normal sinus rhythm) and alternatives of a direct current cardioversion were explained in detail to Ms. Sprecher and she agrees to proceed.                  Disposition: back in 2 mo, sooner if needed.  Will plan to transition to Dr. Cindie.    Current medicines are reviewed at length with the patient today.  The patient did not have any concerns regarding medicines.  Bonney Charlies Arthur, PA-C 12/18/2023 5:54 PM     [facility] [address] [place] [address] [phone] (office)  [phone] (fax)

## 2023-12-18 NOTE — Progress Notes (Unsigned)
 Cardiology Office Note Date:  12/18/2023  Patient ID:  Brandy [name], Brandy [name] January 09, 1956, MRN [id number] PCP:  Justus Leita DEL, MD  Cardiologist/Electrophysiologist: Dr. Fernande AFib ablation: Dr. Cindie 2022    Chief Complaint:  *** discuss OAC  History of Present Illness: Brandy [name] is a 68 y.o. female with history of  NICM with improvement in EF with rate control AFib,  DM, OSA w/CPAP.  She saw Dr. Fernande 02/2023 Reported watch HRs 150's while working in the yard Had ILR to monitor burden > guide OAC Discussed increased DOE > perhaps HFpEF> started Farxiga  Described pause identified on 9/25 occurred in the context of pain and vomiting with a kidney stone; still struggling with urinary urgency.  BP not longer an issue (historically hypotension)  Advised to come in to discuss a/c recommendations with ILR alert for AFib alert  NOTE; 2022  BOTH of her LGIB occurred s/p polyp removal after restart of her a/c. The 1st about 4-5 days after the procedure and a couple days after restart of her eliquis  The 2nd almost 2 weeks after the procedure and a week or so after restart of Eliquis , though (?was a large polyp and she says the GI MD did mention that bringing the edges together was incomplete given the size) And she was weary to resume OAC then   TODAY  *** hx of CM with RVR *** symptoms *** risk score is 3 *** increased stress of late via notes  AFib Historically had been described as permanent  Did undergo ultimately AFib ablation 12/09/2020 (Dr. Cindie)   Past Medical History:  Diagnosis Date   Acute lower GI bleeding 01/19/2020   Arthritis    Atrial fibrillation with rapid ventricular response (HCC)    Chicken pox    Chronic systolic CHF (congestive heart failure) (HCC)    a. 03/2016 Echo: Ef 15-20%, diff HK, ant AK;  b. 05/2016 Echo: Ef 30-35%, diff HK, mildly dil LA/RA.   Diabetes mellitus without complication (HCC)    Dysrhythmia    GERD (gastroesophageal reflux  disease)    Heart murmur    Hip discomfort    been going on for 40 years    History of kidney stones    Hyperlipidemia associated with type 2 diabetes mellitus (HCC) 05/12/2021   Hypertension    Moderate mitral regurgitation    a. 03/2016 Echo: mod MR in setting of LV dysfxn.   Motion sickness    car - back seat   NICM (nonischemic cardiomyopathy) (HCC)    a. 03/2016 Echo: EF 15-20%, diff HK, ant AK, mod MR, mod dil LA, mildly dil RA;  b. 04/2016 Cath: nl cors;  c. 05/2016 Echo: EF 30-35%, diff HK.   Obesity    Obstructive sleep apnea    compliant with CPAP   Persistent atrial fibrillation (HCC)    a. Dx 03/2016;  b. CHA2DS2VASc = 2-->Eliquis  5mg  BID;  b. 05/2016 Failed DCCV x 4.   Sleep apnea    Stomach irritation    Visit for monitoring Tikosyn  therapy 07/24/2016    Past Surgical History:  Procedure Laterality Date   ATRIAL FIBRILLATION ABLATION N/A 12/09/2020   Procedure: ATRIAL FIBRILLATION ABLATION;  Surgeon: Cindie Ole ONEIDA, MD;  Location: [facility];  Service: Cardiovascular;  Laterality: N/A;   BIOPSY N/A 01/12/2020   Procedure: BIOPSY;  Surgeon: Janalyn Keene NOVAK, MD;  Location: [facility];  Service: Endoscopy;  Laterality: N/A;   CARDIAC CATHETERIZATION N/A  04/17/2016   Procedure: Left Heart Cath and Coronary Angiography;  Surgeon: Deatrice DELENA Cage, MD;  Location: [facility] CV [facility];  Service: Cardiovascular;  Laterality: N/A;   CARDIOVERSION N/A 10/22/2020   Procedure: CARDIOVERSION;  Surgeon: Mady Bruckner, MD;  Location: [facility] ORS;  Service: Cardiovascular;  Laterality: N/A;   CHOLECYSTECTOMY     COLON SURGERY  2021   COLONOSCOPY N/A 01/20/2020   Procedure: COLONOSCOPY;  Surgeon: Maryruth Ole DASEN, MD;  Location: [facility] END[facility];  Service: Endoscopy;  Laterality: N/A;   COLONOSCOPY WITH PROPOFOL  N/A 01/12/2020   Procedure: COLONOSCOPY WITH PROPOFOL ;  Surgeon: Janalyn Keene NOVAK, MD;  Location: [facility];  Service: Endoscopy;   Laterality: N/A;  Diabetic - oral meds sleep apnea   ELECTROPHYSIOLOGIC STUDY N/A 05/26/2016   Procedure: Cardioversion;  Surgeon: Deatrice DELENA Cage, MD;  Location: [facility] OR[facility];  Service: Cardiovascular;  Laterality: N/A;   ESOPHAGOGASTRODUODENOSCOPY (EGD) WITH PROPOFOL  N/A 01/12/2020   Procedure: ESOPHAGOGASTRODUODENOSCOPY (EGD) WITH PROPOFOL ;  Surgeon: Janalyn Keene NOVAK, MD;  Location: [facility];  Service: Endoscopy;  Laterality: N/A;   POLYPECTOMY N/A 01/12/2020   Procedure: POLYPECTOMY;  Surgeon: Janalyn Keene NOVAK, MD;  Location: [facility];  Service: Endoscopy;  Laterality: N/A;    Current Outpatient Medications  Medication Sig Dispense Refill   CALCIUM  PO Take by mouth daily.     Celery Seed OIL Using as needed to help with constipation (Patient not taking: Reported on 12/12/2023)     Cholecalciferol  (VITAMIN D3) 1000 units CAPS Take 1,000 Units by mouth 2 (two) times daily.     dapagliflozin  propanediol (FARXIGA ) 10 MG TABS tablet TAKE 1 TABLET DAILY BEFORE BREAKFAST 90 tablet 0   furosemide  (LASIX ) 20 MG tablet Take 1 tablet (20 mg total) by mouth daily as needed for fluid or edema. 30 tablet 0   glucose blood (ONETOUCH VERIO) test strip USE 1 STRIP TO CHECK GLUCOSE 4 TIMES DAILY AS  DIRECTED 400 each 0   Lancets (ONETOUCH DELICA PLUS LANCET33G) MISC USE 1  TO CHECK GLUCOSE 4 TIMES DAILY AS DIRECTED 400 each 0   Lavender Oil OIL Apply 1 application topically daily as needed (stress).     Lemon Oil OIL Take 1 application by mouth daily as needed (add to water ).     Lemongrass Oil OIL Apply 1 application topically daily as needed (multiple uses). (Patient not taking: Reported on 12/12/2023)     Magnesium Bisglycinate (MAG GLYCINATE) 100 MG TABS Take 270 mg by mouth in the morning, at noon, and at bedtime. (Patient taking differently: Take 270 mg by mouth in the morning, at noon, and at bedtime. Takes 1 in the morning and 1 in the evening)     MAGNESIUM CITRATE PO Take 1  tablet by mouth daily. (Patient taking differently: Take 1 tablet by mouth daily. Takes 1 tablet in the afternoon)     metFORMIN  (GLUCOPHAGE -XR) 500 MG 24 hr tablet Take 1/2 (one-half) tablet by mouth twice daily (Patient taking differently: Take 1/2 (one-half) tablet by mouth twice daily Takes 1 tablet once a day) 90 tablet 3   metoprolol  tartrate (LOPRESSOR ) 50 MG tablet Take 1/2 (one-half) tablet by mouth twice daily 90 tablet 0   Multiple Vitamins-Minerals (MULTIVITAMIN WITH MINERALS) tablet Take 1 tablet by mouth daily.     Oil Base OIL Breathe oil as needed for congestion (Patient not taking: Reported on 12/12/2023)     OIL OF OREGANO PO Uses as needed     peppermint oil liquid Apply  1 application topically daily as needed (itchy/ cold symptoms).     Tea Tree Oil OIL Apply 1 application topically daily as needed (fungus).     Turmeric 500 MG CAPS Take by mouth.     vitamin B-12 (CYANOCOBALAMIN) 500 MCG tablet Take 500 mcg by mouth daily.     vitamin C (ASCORBIC ACID) 500 MG tablet Take 500 mg by mouth daily.      vitamin E 45 MG (100 UNITS) capsule Take by mouth daily.     No current facility-administered medications for this visit.    Allergies:   Patient has no known allergies.   Social History:  The patient  reports that she has never smoked. She has never been exposed to tobacco smoke. She has never used smokeless tobacco. She reports that she does not drink alcohol and does not use drugs.   Family History:  The patient's family history includes Anxiety disorder in her mother; Arthritis in her father and mother; Asthma in her mother; COPD in her father and mother; Cancer in her maternal aunt and maternal aunt; Depression in her mother; Diabetes in her paternal aunt; Heart disease in her father; Hypertension in her father; Varicose Veins in her father.  ROS:  Please see the history of present illness.    All other systems are reviewed and otherwise negative.   PHYSICAL EXAM:  VS:   There were no vitals taken for this visit. BMI: There is no height or weight on file to calculate BMI. Well nourished, well developed, in no acute distress HEENT: normocephalic, atraumatic Neck: no JVD, carotid bruits or masses Cardiac: *** irreg-irreg; no significant murmurs, no rubs, or gallops Lungs: *** CTA b/l, no wheezing, rhonchi or rales Abd: soft, nontender MS: no deformity or atrophy Ext: *** no edema Skin: warm and dry, no rash Neuro:  No gross deficits appreciated Psych: euthymic mood, full affect   EKG: done today and reviewed by myself ***   10/28/21: TTE 1. Left ventricular ejection fraction, by estimation, is 55 to 60%. The  left ventricle has normal function. The left ventricle has no regional  wall motion abnormalities. Left ventricular diastolic parameters were  normal. The average left ventricular  global longitudinal strain is -15.0 %.   2. Right ventricular systolic function is normal. The right ventricular  size is normal. There is normal pulmonary artery systolic pressure. The  estimated right ventricular systolic pressure is 26.5 mmHg.   3. Left atrial size was borderline mildly dilated.   4. The mitral valve is normal in structure. Mild mitral valve  regurgitation. No evidence of mitral stenosis.   5. The aortic valve is tricuspid. Aortic valve regurgitation is not  visualized. No aortic stenosis is present.   6. The inferior vena cava is normal in size with greater than 50%  respiratory variability, suggesting right atrial pressure of 3 mmHg.    12/09/2020: EPS/ablation CONCLUSIONS: 1. Successful PVI 2. Successful ablation/isolation of the posterior wall 3. Successful ablation of complex fractional atrial electrograms outside the LAA and LSPV 4. Intracardiac echo reveals trivial pericardial effusion, 4 pulmonary veins, normal LV function 5. No early apparent complications. 6. Colchicine  0.6mg  PO BID x 5 days 7. Protonix  40mg  PO daily for 45 days    12/31/2018; TTE IMPRESSIONS   1. The left ventricle has normal systolic function with an ejection  fraction of 60-65%. The cavity size was normal. There is mildly increased  left ventricular wall thickness. Left ventricular diastolic function could  not  be evaluated secondary to atrial   fibrillation. No evidence of left ventricular regional wall motion  abnormalities.   2. The right ventricle has normal systolic function. The cavity was  normal. There is no increase in right ventricular wall thickness. Right  ventricular systolic pressure is upper normal to mildly elevated (25-30  mmHg plus central venous pressure).   3. Right atrial size was mildly dilated.   4. The aortic valve is tricuspid.   5. The aorta is normal unless otherwise noted.   6. The interatrial septum was not well visualized.     04/17/2016; LHC LV end diastolic pressure is normal.   1. Normal coronary arteries. 2. Severely dilated left ventricle with Normal left ventricular end-diastolic pressure. Left ventricular angiography was not performed. EF was severely dilated by echo.   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 Continue medical therapy. I am going to start anticoagulation with Eliquis  this evening. If ventricular rate is controlled, the patient can be discharged home with rate control and anticoagulation with plans for outpatient cardioversion in 3 weeks. Otherwise, if the rate is not controlled, recommend TEE guided cardioversion tomorrow.    Recent Labs: 05/18/2023: ALT 22; BUN 16; Creatinine, Ser 0.91; Hemoglobin 14.4; Platelets 276; Potassium 4.6; Sodium 141; TSH 2.260  05/18/2023: Chol/HDL Ratio 4.4; Cholesterol, Total 191; HDL 43; LDL Chol Calc (NIH) 120; Triglycerides 157   CrCl cannot be calculated (Patient's most recent lab result is older than the maximum 21 days allowed.).   Wt Readings from Last 3 Encounters:  12/12/23 227  lb (103 kg)  12/07/23 232 lb 6.4 oz (105.4 kg)  11/26/23 232 lb 9.6 oz (105.5 kg)     Other studies reviewed: Additional studies/records reviewed today include: summarized above  ASSESSMENT AND PLAN:  1. Persistent AFib     CHA2DS2Vasc is ***  2. NICM     Felt tachy-mediated     Normalization of her EF by last echo 2023  3. Hypotension     Historically has been on midodrine  for low BPs            Disposition: ***  Current medicines are reviewed at length with the patient today.  The patient did not have any concerns regarding medicines.  Bonney Charlies Arthur, PA-C 12/18/2023 5:54 PM     [facility] [address] [place] [address] [phone] (office)  [phone] (fax)

## 2023-12-19 ENCOUNTER — Other Ambulatory Visit (HOSPITAL_COMMUNITY): Payer: Self-pay

## 2023-12-19 ENCOUNTER — Ambulatory Visit: Attending: Cardiology | Admitting: Physician Assistant

## 2023-12-19 VITALS — BP 112/64 | Ht 67.0 in | Wt 225.0 lb

## 2023-12-19 DIAGNOSIS — D6869 Other thrombophilia: Secondary | ICD-10-CM

## 2023-12-19 DIAGNOSIS — I4819 Other persistent atrial fibrillation: Secondary | ICD-10-CM | POA: Diagnosis not present

## 2023-12-19 DIAGNOSIS — Z95818 Presence of other cardiac implants and grafts: Secondary | ICD-10-CM

## 2023-12-19 DIAGNOSIS — I428 Other cardiomyopathies: Secondary | ICD-10-CM

## 2023-12-19 MED ORDER — METOPROLOL TARTRATE 50 MG PO TABS: 50.0000 mg | ORAL_TABLET | Freq: Two times a day (BID) | ORAL | 2 refills | Status: DC

## 2023-12-19 MED ORDER — APIXABAN 5 MG PO TABS
5.0000 mg | ORAL_TABLET | Freq: Two times a day (BID) | ORAL | 6 refills | Status: DC
Start: 2023-12-19 — End: 2024-02-11
  Filled 2023-12-19: qty 60, 30d supply, fill #0
  Filled 2024-01-15: qty 60, 30d supply, fill #1

## 2023-12-19 NOTE — Patient Instructions (Addendum)
 Medication Instructions:   START TAKING :  METOPROLOL  50 MG TWICE A DAY   START TAKING :  ELIQUIS  5 MG TWICE A DAY   *If you need a refill on your cardiac medications before your next appointment, please call your pharmacy*  Lab Work:  PLEASE GO DOWN STAIRS  [facility]  FIRST FLOOR   ( GET OFF ELEVATORS WALK TOWARDS WAITING AREA LAB LOCATED BY PHARMACY):  BMET AND CBC TODAY    If you have labs (blood work) drawn today and your tests are completely normal, you will receive your results only by: MyChart Message (if you have MyChart) OR A paper copy in the mail If you have any lab test that is abnormal or we need to change your treatment, we will call you to review the results.  Testing/Procedures: Your physician has recommended that you have a Cardioversion (DCCV). Electrical Cardioversion uses a jolt of electricity to your heart either through paddles or wired patches attached to your chest. This is a controlled, usually prescheduled, procedure. Defibrillation is done under light anesthesia in the hospital, and you usually go home the day of the procedure. This is done to get your heart back into a normal rhythm. You are not awake for the procedure. Please see the instruction sheet given to you today.  Follow-Up: At [facility], you and your health needs are our priority.  As part of our continuing mission to provide you with exceptional heart care, our providers are all part of one team.  This team includes your primary Cardiologist (physician) and Advanced Practice Providers or APPs (Physician Assistants and Nurse Practitioners) who all work together to provide you with the care you need, when you need it.  Your next appointment:  2 month(s) ISSUES ) Provider:  Ole Holts, MD or Charlies Arthur, PA-C  ( CONTACT  CASSIE HALL/ ANGELINE HAMMER FOR EP [facility]   We recommend signing up for the patient portal called MyChart.  Sign up information is provided on this After Visit  Summary.  MyChart is used to connect with patients for Virtual Visits (Telemedicine).  Patients are able to view lab/test results, encounter notes, upcoming appointments, etc.  Non-urgent messages can be sent to your provider as well.   To learn more about what you can do with MyChart, go to [email or web address].   Other Instructions     Dear Brandy [name]  You are scheduled for a Cardioversion on Thursday September 4 with Dr. Lonni.  Please arrive at the [name] (Main Entrance A) at [facility]: [address] [place], [place] [address] at 8:00 AM (This time is 1 hour(s) before your procedure to ensure your preparation).   Free valet parking service is available. You will check in at ADMITTING.   *Please Note: You will receive a call the day before your procedure to confirm the appointment time. That time may have changed from the original time based on the schedule for that day.*    DIET:  Nothing to eat or drink after midnight except a sip of water  with medications (see medication instructions below)  MEDICATION INSTRUCTIONS: !!IF ANY NEW MEDICATIONS ARE STARTED AFTER TODAY, PLEASE NOTIFY YOUR PROVIDER AS SOON AS POSSIBLE!!  FYI: Medications such as Semaglutide (Ozempic, [place]), Tirzepatide (Mounjaro, Zepbound), Dulaglutide (Trulicity), etc (GLP1 agonists) AND Canagliflozin (Invokana), Dapagliflozin  (Farxiga ), Empagliflozin (Jardiance), Ertugliflozin (Steglatro), Bexagliflozin [employer]) or any combination with one of these drugs such as Invokamet (Canagliflozin/Metformin ), Synjardy (Empagliflozin/Metformin ), etc (SGLT2 inhibitors) must be held  around the time of a procedure. This is not a comprehensive list of all of these drugs. Please review all of your medications and talk to your provider if you take any one of these. If you are not sure, ask your provider.   HOLD: Farxiga  for 3 days prior to the procedure. Last dose on Saturday, September 1 st   Continue taking  your anticoagulant (blood thinner): Apixaban  (Eliquis ).   You will need to continue this after your procedure until you are told by your provider that it is safe to stop.    LABS:  Come to the lab at the [facility] ([address] [place], 1st Floor) between the hours of 8:00 am and 4:30 pm. You do NOT have to be fasting.   FYI:  For your safety, and to allow us  to monitor your vital signs accurately during the surgery/procedure we request: If you have artificial nails, gel coating, SNS etc, please have those removed prior to your surgery/procedure. Not having the nail coverings /polish removed may result in cancellation or delay of your surgery/procedure.  Your support person will be asked to wait in the waiting room during your procedure.  It is OK to have someone drop you off and come back when you are ready to be discharged.  You cannot drive after the procedure and will need someone to drive you home.  Bring your insurance cards.  *Special Note: Every effort is made to have your procedure done on time. Occasionally there are emergencies that occur at the hospital that may cause delays. Please be patient if a delay does occur.

## 2023-12-20 ENCOUNTER — Ambulatory Visit: Payer: Self-pay | Admitting: Physician Assistant

## 2023-12-20 ENCOUNTER — Ambulatory Visit

## 2023-12-20 DIAGNOSIS — I428 Other cardiomyopathies: Secondary | ICD-10-CM

## 2023-12-20 LAB — CBC
Hematocrit: 50.7 % — ABNORMAL HIGH (ref 34.0–46.6)
Hemoglobin: 16.9 g/dL — ABNORMAL HIGH (ref 11.1–15.9)
MCH: 31.1 pg (ref 26.6–33.0)
MCHC: 33.3 g/dL (ref 31.5–35.7)
MCV: 93 fL (ref 79–97)
Platelets: 413 x10E3/uL (ref 150–450)
RBC: 5.44 x10E6/uL — ABNORMAL HIGH (ref 3.77–5.28)
RDW: 12.5 % (ref 11.7–15.4)
WBC: 13.8 x10E3/uL — ABNORMAL HIGH (ref 3.4–10.8)

## 2023-12-20 LAB — BASIC METABOLIC PANEL WITH GFR
BUN/Creatinine Ratio: 23 (ref 12–28)
BUN: 21 mg/dL (ref 8–27)
CO2: 22 mmol/L (ref 20–29)
Calcium: 10 mg/dL (ref 8.7–10.3)
Chloride: 101 mmol/L (ref 96–106)
Creatinine, Ser: 0.93 mg/dL (ref 0.57–1.00)
Glucose: 86 mg/dL (ref 70–99)
Potassium: 5.1 mmol/L (ref 3.5–5.2)
Sodium: 139 mmol/L (ref 134–144)
eGFR: 67 mL/min/1.73 (ref >59)

## 2023-12-20 NOTE — Therapy (Signed)
 OUTPATIENT PHYSICAL THERAPY ANKLE/KNEE/HIP TREATMENT/PROGRESS NOTE  Dates of reporting period  08/14/23   to   12/21/23   Patient Name: Brandy [name] MRN: [id number] DOB:01-08-1956, 68 y.o., female Today's Date: 12/22/2023  END OF SESSION:  PT End of Session - 12/21/23 0933     Visit Number 20    Number of Visits 61    Date for PT Re-Evaluation 03/04/24    Authorization Type eval: 06/08/23    PT Start Time 0933    PT Stop Time 1015    PT Time Calculation (min) 42 min    Activity Tolerance Patient tolerated treatment well    Behavior During Therapy [facility] for tasks assessed/performed         Past Medical History:  Diagnosis Date   Acute lower GI bleeding 01/19/2020   Arthritis    Atrial fibrillation with rapid ventricular response (HCC)    Chicken pox    Chronic systolic CHF (congestive heart failure) (HCC)    a. 03/2016 Echo: Ef 15-20%, diff HK, ant AK;  b. 05/2016 Echo: Ef 30-35%, diff HK, mildly dil LA/RA.   Diabetes mellitus without complication (HCC)    Dysrhythmia    GERD (gastroesophageal reflux disease)    Heart murmur    Hip discomfort    been going on for 40 years    History of kidney stones    Hyperlipidemia associated with type 2 diabetes mellitus (HCC) 05/12/2021   Hypertension    Moderate mitral regurgitation    a. 03/2016 Echo: mod MR in setting of LV dysfxn.   Motion sickness    car - back seat   NICM (nonischemic cardiomyopathy) (HCC)    a. 03/2016 Echo: EF 15-20%, diff HK, ant AK, mod MR, mod dil LA, mildly dil RA;  b. 04/2016 Cath: nl cors;  c. 05/2016 Echo: EF 30-35%, diff HK.   Obesity    Obstructive sleep apnea    compliant with CPAP   Persistent atrial fibrillation (HCC)    a. Dx 03/2016;  b. CHA2DS2VASc = 2-->Eliquis  5mg  BID;  b. 05/2016 Failed DCCV x 4.   Sleep apnea    Stomach irritation    Visit for monitoring Tikosyn  therapy 07/24/2016   Past Surgical History:  Procedure Laterality Date   ATRIAL FIBRILLATION ABLATION N/A 12/09/2020   Procedure:  ATRIAL FIBRILLATION ABLATION;  Surgeon: Cindie Ole ONEIDA, MD;  Location: [facility];  Service: Cardiovascular;  Laterality: N/A;   BIOPSY N/A 01/12/2020   Procedure: BIOPSY;  Surgeon: Janalyn Keene NOVAK, MD;  Location: [facility];  Service: Endoscopy;  Laterality: N/A;   CARDIAC CATHETERIZATION N/A 04/17/2016   Procedure: Left Heart Cath and Coronary Angiography;  Surgeon: Deatrice DELENA Cage, MD;  Location: [facility] INVASIVE CV [facility];  Service: Cardiovascular;  Laterality: N/A;   CARDIOVERSION N/A 10/22/2020   Procedure: CARDIOVERSION;  Surgeon: Mady Bruckner, MD;  Location: [facility] ORS;  Service: Cardiovascular;  Laterality: N/A;   CHOLECYSTECTOMY     COLON SURGERY  2021   COLONOSCOPY N/A 01/20/2020   Procedure: COLONOSCOPY;  Surgeon: Maryruth Ole ONEIDA, MD;  Location: [facility] ENDOSCOPY;  Service: Endoscopy;  Laterality: N/A;   COLONOSCOPY WITH PROPOFOL  N/A 01/12/2020   Procedure: COLONOSCOPY WITH PROPOFOL ;  Surgeon: Janalyn Keene NOVAK, MD;  Location: [facility];  Service: Endoscopy;  Laterality: N/A;  Diabetic - oral meds sleep apnea   ELECTROPHYSIOLOGIC STUDY N/A 05/26/2016   Procedure: Cardioversion;  Surgeon: Deatrice DELENA Cage, MD;  Location: [facility] ORS;  Service: Cardiovascular;  Laterality:  N/A;   ESOPHAGOGASTRODUODENOSCOPY (EGD) WITH PROPOFOL  N/A 01/12/2020   Procedure: ESOPHAGOGASTRODUODENOSCOPY (EGD) WITH PROPOFOL ;  Surgeon: Janalyn Keene NOVAK, MD;  Location: [facility];  Service: Endoscopy;  Laterality: N/A;   POLYPECTOMY N/A 01/12/2020   Procedure: POLYPECTOMY;  Surgeon: Janalyn Keene NOVAK, MD;  Location: [facility];  Service: Endoscopy;  Laterality: N/A;   Patient Active Problem List   Diagnosis Date Noted   Mucous cyst of digit of left hand 05/18/2023   Peroneal tendinitis, left 10/13/2022   Left lumbar radiculopathy 07/26/2022   Arthralgia of left knee 07/26/2022   Pes anserinus bursitis of right knee 04/19/2022   Osteoarthritis of both knees  03/27/2022   Greater trochanteric pain syndrome of both lower extremities 03/27/2022   It band syndrome, right 03/27/2022   Hyperlipidemia associated with type 2 diabetes mellitus (HCC) 05/12/2021   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   Varicose veins of leg with pain, bilateral 07/06/2020   Type II diabetes mellitus with complication (HCC) 01/19/2020   Gastric polyp    Colon polyps    Varicose veins of both lower extremities 10/20/2019   Dysphagia 10/20/2019   Osteoarthritis of right hand 10/31/2016   Nonischemic cardiomyopathy (HCC) 04/14/2016   BMI 33.0-33.9,adult 04/14/2016    PCP: Leita Adie, MD  REFERRING PROVIDER: Selinda Ku, MD  REFERRING DIAG:  [phone] (ICD-10-CM) - Peroneal tendinitis, left  M54.16 (ICD-10-CM) - Left lumbar radiculopathy  M25.562 (ICD-10-CM) - Arthralgia of left knee  M17.11 (ICD-10-CM) - Primary osteoarthritis of right knee  M76.31 (ICD-10-CM) - It band syndrome, right  M25.551 (ICD-10-CM) - Greater 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   Rationale for Evaluation and Treatment: Rehabilitation  THERAPY DIAG: Muscle weakness (generalized)  Pain in right hip  Chronic pain of left knee  ONSET DATE: Chronic, aggravated over Christmas (December 2024)  FOLLOW-UP APPT SCHEDULED WITH REFERRING PROVIDER: No, initially no but now has appt for July;  INITIAL EVALUATION: SUBJECTIVE:                                                                                                                                                                                         SUBJECTIVE STATEMENT:  Left ankle and foot pain  PERTINENT HISTORY: Pt reports pain in left ankle that seems to have flared up over Christmas after pt reported excess walking. Pt reports feeling pulling and burning at the base of the lateral ankle. She has previously been to PT for pain in her left hip and knee but has now noticed increased pain in the left  ankle/foot. Pt expresses difficulty with exercises; specifically pressure on the toes, stretching calves, and rocking  on toes. Pt notes pain is as high as 5/10 NPS but is constant and never really goes away, with lowest pain at 1/10 NPS.  PAIN:    Pain Intensity: Present: 1/10, Best: 1/10, Worst: 5/10 Pain location: Lateral foot and ankle Pain Quality: constant and burning  Radiating: Yes  Swelling: No  Popping, catching, locking: No  Numbness/Tingling: No Focal Weakness: Yes Aggravating factors: Touching lateral ankle, moving the ankle, ascending steps when leading with left foot, walking Relieving factors: Stopping activity, resting, brace temporarily relieves some pain 24-hour pain behavior: No night pain when sleeping on back, stretching in AM helps but feels unstable, PM pain depends on activity during the day History of prior back, hip, knee, or ankle injury, pain, surgery, or therapy: Yes; chronic hip, knee, foot pain Dominant hand: right Imaging: No  Typical footwear: Sneakers or rubber boots Red flags: Negative for personal history of cancer, chills/fever, night sweats, nausea, vomiting, unrelenting pain): Negative  PRECAUTIONS: None  WEIGHT BEARING RESTRICTIONS: No  FALLS: Has patient fallen in last 6 months? 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  OBJECTIVE:   Patient Surveys  08/14/23: FADI: FADI  Activities Score: 80 / 104 or 77%  Cognition Patient is oriented to person, place, and time.  Recent memory is intact.  Remote memory is intact.  Attention span and concentration are intact.  Expressive speech is intact.  Patient's fund of knowledge is within normal limits for educational level.    Gross Musculoskeletal Assessment Bulk: Normal Tone: Normal No trophic changes noted to foot/ankle. No ecchymosis, erythema, or edema noted. No gross ankle/foot deformity noted  GAIT: Comments: Mild toe out gait on right foot. Antalgic gait noted on left side.  Posture: No noted discrepancies.  AROM AROM (Normal range in degrees) AROM       Hip Right Left  Flexion (125) WNL WNL  Extension (15) WNL WNL  Abduction (40) WNL WNL  Adduction  WNL WNL  Internal Rotation (45) WNL WNL  External Rotation (45) WNL WNL      Knee    Flexion (135) WNL WNL  Extension (0) WNL WNL      Ankle    Dorsiflexion (20) 10 (WB) 10 (WB)  Plantarflexion (50) 40 35  Inversion (35) 30 20  Eversion (15) 7 8  (* = pain; Blank rows = not tested)   LE MMT: MMT (out of 5) Right  Left   Hip flexion 4 4  Hip extension 4 4  Hip abduction 4 4+  Hip adduction 4+ 4  Hip internal rotation 5 5  Hip external rotation 5 5  Knee flexion 4 4  Knee extension 5 5  Ankle dorsiflexion 5 5  Ankle plantarflexion    Ankle inversion 5 5*  Ankle eversion 5 5*  (* = pain; Blank rows = not tested)  Sensation Diminished sensation left S1 and lateral aspect of left foot.  Reflexes Deferred  Palpation Location LEFT  RIGHT  Plantar fascia 1 0  Dorsal aspect of foot 1 0  Tarsal tunnel 1 0  (Blank rows = not tested) Graded on 0-4 scale (0 = no pain, 1 = pain, 2 = pain with wincing/grimacing/flinching, 3 = pain with withdrawal, 4 = unwilling to allow palpation), (Blank rows = not tested)  Passive Accessory Motion Superior Tibiofibular Joint: WNL Inferior Tibiofibular Joint: WNL Talocrural Joint  Distraction: WNL but noted pain with pulling feeling with left foot Talocrural Joint AP: L hypomobile, painful where hand is purchased Talocrural Joint PA: L hypomobile  SPECIAL TESTS Ligamentous Integrity Anterior Drawer (ATF, 10-15 plantarflexion with anterior translation): Negative Talar Tilt (CFL, inversion): Negative    TODAY'S TREATMENT: 12/21/2023   Subjective: Pt states that her heart went back into afib on 12/16/23. She has been to the cardiologist and was placed on Eliquis . She is scheduled for a cardioversion on 01/17/24. Per pt the cardiologist believes that it may have been the steroid shots which converted her back to afib. She states that her heart rate has been controlled.   PAIN: Denies resting R hip pain;   Ther-ex  NuStep L1-4 x 10 minutes for warm-up and BLE strengthening during interval history (5 minutes unbilled);  Vitals:Seated: BP: 132/54 mmHg, HR: 80 bpm (60s manual radial count), SpO2: 99%  LAQ over bolster 2 x 10 with manual resistance BLE; Hooklying clams with manual resistance 2 x 10; Hooklying adductor squeeze with manual resistance 2 x 10; Hooklying bridges x 5, discontinued additional reps due to increase in pain; Supine SLR posterior pelvic tilt 2 x 10 BLE; Sidelying hip abduction 2 x 10 BLE; Updated HEP and reviewed with patient;   Not today:  Heel raises with UE support x 15 (stopped due to pain in hamstring); DF in WB 06/13/23: 10 degrees BLE Ankle pumps 2 x 30s BLE; Seated ankle circumduction 2 x 30s each direction BLE; Seated resisted PF with green tband x 20 BLE; Seated resisted inversion with green tband x 20 BLE; Seated resisted eversion with green tband x 20 BLE; Supine isometric DF 3 x 20s BLE; Supine isometric PF 3 x 20s BLE; Ankle DF stretch 3 x 30s BLE; Pball straight knee bridges x 10; Prone L knee hamstring curls with manual resistance from therapist 2 x 10; Prone LLE straight knee hip extension 2 x 10; Prone L hip ER with  knee at 90 flexion and manual resistance from therapist 2 x 10; Supine L quad sets over towel roll 5s hold x 10; R sidelying straight leg L hip abduction with manual resistance 2 x 10; R sidelying L hip reverse clam with manual resistance 2 x 10;   PATIENT EDUCATION:  Education details: Pt educated throughout session about proper posture and technique with exercises. Improved exercise technique, movement at target joints, use of target muscles after min to mod verbal, visual, tactile cues.  Updated HEP Person educated: Patient Education method: Explanation, Tactile cues, Verbal cues, and Handouts Education comprehension: verbalized understanding and returned demonstration   HOME EXERCISE PROGRAM:  Access Code: [email or web address] URL: [email or web address] Date: 12/11/2023 Prepared by: Selinda Eck  Exercises - Seated Ankle Eversion with Resistance  - 1 x daily - 7 x weekly - 2 sets - 10 reps - 3s hold - Seated Ankle Inversion with Resistance and Legs Crossed (Mirrored)  - 1 x daily - 7 x weekly - 2 sets - 10 reps - 3s hold - Seated Ankle Plantarflexion with Resistance  - 1 x daily - 7 x weekly -  2 sets - 10 reps - 3s hold - Long Sitting Calf Stretch with Strap  - 1 x daily - 7 x weekly - 3 reps - 30-45s hold - Standing Gastroc Stretch on Step  - 1 x daily - 7 x weekly - 3 sets - 10 reps - Standing Dorsiflexion Self-Mobilization on Step  - 1 x daily - 7 x weekly - 3 sets - 10 reps - Standing Dorsiflexion Self-Mobilization on Step (Mirrored)  - 1 x daily - 7 x weekly - 3 sets - 10 reps - Hooklying Isometric Hip Abduction with Belt  - 1 x daily - 7 x weekly - 2 sets - 5 reps - 30s hold/30s relax hold   ASSESSMENT:  CLINICAL IMPRESSION: Updated outcome measures with patient during visit her last visit. Her worst R hip pain had improved considerably since her steroid injections but unfortunately she has converted back into a-fib. Her rate is controlled, she is on blood thinners, and  is scheduled for a cardioversion. Her HAGOS score had improved from 38% initially to 44% which had not yet reached clinical significance but was trending in the right direction. She reports ongoing intermittent and activity dependent L foot, L ankle, L knee, and L hip pain. However she had met all the goals related to her ankle and knee. Progressed strengthening for hip rotators during session with no increase in pain. No HEP updates at this time. Pt encouraged to follow-up as scheduled. She will benefit from PT services to address deficits in strength and pain in order to improve pain-free function at home and with leisure activities.   OBJECTIVE IMPAIRMENTS: decreased strength and hypomobility.   ACTIVITY LIMITATIONS: stairs and locomotion level  PARTICIPATION LIMITATIONS: occupation and yard work  PERSONAL FACTORS: Age, Behavior pattern, Past/current experiences, Time since onset of injury/illness/exacerbation, and 1-2 comorbidities: type II diabetes and left lumbar radiculopathy are also affecting patient's functional outcome.   REHAB POTENTIAL: Good  CLINICAL DECISION MAKING: Evolving/moderate complexity  EVALUATION COMPLEXITY: Moderate   GOALS: Goals reviewed with patient? Yes  SHORT TERM GOALS:   Pt will be independent with HEP to improve strength and decrease ankle pain to improve pain-free function at home and work. Baseline:  Goal status: ACHIEVED   LONG TERM GOALS: Target date: 03/04/2024   1.  Pt will decrease worst ankle pain by at least 3 points on the NPRS in order to demonstrate clinically significant reduction in ankle pain. Baseline: 06/08/23: 1/10 best, 5/10 worst; 08/14/23: worst: 1/10; Goal status: ACHIEVED  2.  Pt will increase FADI score by at least 4 points in order demonstrate clinically significant reduction in ankle pain/disability.       Baseline: 06/13/23: 65%; 08/14/23: 80 / 104 or 77% Goal status: ACHIEVED  3.  Pt will improve strength of ankle  inversion/eversion with limited to no pain in order to demonstrate improvement in strength and function.  Baseline: 06/08/23: 5/5 with pain; 08/14/23: 5/5 without pain Goal status: ACHIEVED  4.  Pt will report at least 50% improvement in L ankle and knee symptoms in order to demonstrate improvement in her ability to work in the yard/garden with less pain.  Baseline: 08/14/23: 75% improvement Goal status: ACHIEVED  5.  Pt will decrease HAGOS score by at least 20 points in order demonstrate clinically significant reduction in hip pain/disability.  Baseline: 08/14/23: HAGOS Score: 37.7%, 12/11/23: 44.3%  Goal status: PARTIALLY MET  6.  Pt will decrease worst posterior R hip pain by at least 3 points  on the NPRS in order to demonstrate clinically significant reduction in hip pain. Baseline: 08/14/23: worst: 8/10; 12/11/23: 2/10; Goal status: ACHIEVED   PLAN: PT FREQUENCY: 1x/week  PT DURATION: 12 weeks  PLANNED INTERVENTIONS: Therapeutic exercises, Therapeutic activity, Neuromuscular re-education, Balance training, Gait training, Patient/Family education, Self Care, Joint mobilization, Joint manipulation, Vestibular training, Canalith repositioning, Orthotic/Fit training, DME instructions, Dry Needling, Electrical stimulation, Spinal manipulation, Spinal mobilization, Cryotherapy, Moist heat, Taping, Traction, Ultrasound, Ionotophoresis 4mg /ml Dexamethasone , Manual therapy, and Re-evaluation.  PLAN FOR NEXT SESSION: strengthening and mobility exercises focused on R hip pain, review and modify HEP as necessary  Debria Broecker D Tahjay Binion PT, DPT, GCS  8:26 AM,12/22/23

## 2023-12-21 ENCOUNTER — Ambulatory Visit: Attending: Family Medicine

## 2023-12-21 ENCOUNTER — Encounter: Payer: Self-pay | Admitting: Family Medicine

## 2023-12-21 DIAGNOSIS — M6281 Muscle weakness (generalized): Secondary | ICD-10-CM | POA: Diagnosis not present

## 2023-12-21 DIAGNOSIS — M25562 Pain in left knee: Secondary | ICD-10-CM | POA: Insufficient documentation

## 2023-12-21 DIAGNOSIS — G8929 Other chronic pain: Secondary | ICD-10-CM | POA: Diagnosis not present

## 2023-12-21 DIAGNOSIS — M25551 Pain in right hip: Secondary | ICD-10-CM | POA: Insufficient documentation

## 2023-12-21 LAB — CUP PACEART REMOTE DEVICE CHECK
Date Time Interrogation Session: 20250806232532
Implantable Pulse Generator Implant Date: 20230928

## 2023-12-26 ENCOUNTER — Ambulatory Visit: Payer: Self-pay | Admitting: Cardiology

## 2023-12-27 NOTE — Therapy (Signed)
 OUTPATIENT PHYSICAL THERAPY ANKLE/KNEE/HIP TREATMENT  Patient Name: Brandy [name] MRN: [id number] DOB:Oct 16, 1955, 67 y.o., female Today's Date: 12/28/2023  END OF SESSION:  PT End of Session - 12/28/23 0759     Visit Number 21    Number of Visits 61    Date for PT Re-Evaluation 03/04/24    Authorization Type eval: 06/08/23    PT Start Time 0800    PT Stop Time 0845    PT Time Calculation (min) 45 min    Activity Tolerance Patient tolerated treatment well    Behavior During Therapy [facility] for tasks assessed/performed         Past Medical History:  Diagnosis Date   Acute lower GI bleeding 01/19/2020   Arthritis    Atrial fibrillation with rapid ventricular response (HCC)    Chicken pox    Chronic systolic CHF (congestive heart failure) (HCC)    a. 03/2016 Echo: Ef 15-20%, diff HK, ant AK;  b. 05/2016 Echo: Ef 30-35%, diff HK, mildly dil LA/RA.   Diabetes mellitus without complication (HCC)    Dysrhythmia    GERD (gastroesophageal reflux disease)    Heart murmur    Hip discomfort    been going on for 40 years    History of kidney stones    Hyperlipidemia associated with type 2 diabetes mellitus (HCC) 05/12/2021   Hypertension    Moderate mitral regurgitation    a. 03/2016 Echo: mod MR in setting of LV dysfxn.   Motion sickness    car - back seat   NICM (nonischemic cardiomyopathy) (HCC)    a. 03/2016 Echo: EF 15-20%, diff HK, ant AK, mod MR, mod dil LA, mildly dil RA;  b. 04/2016 Cath: nl cors;  c. 05/2016 Echo: EF 30-35%, diff HK.   Obesity    Obstructive sleep apnea    compliant with CPAP   Persistent atrial fibrillation (HCC)    a. Dx 03/2016;  b. CHA2DS2VASc = 2-->Eliquis  5mg  BID;  b. 05/2016 Failed DCCV x 4.   Sleep apnea    Stomach irritation    Visit for monitoring Tikosyn  therapy 07/24/2016   Past Surgical History:  Procedure Laterality Date   ATRIAL FIBRILLATION ABLATION N/A 12/09/2020   Procedure: ATRIAL FIBRILLATION ABLATION;  Surgeon: Cindie Ole ONEIDA, MD;   Location: [facility];  Service: Cardiovascular;  Laterality: N/A;   BIOPSY N/A 01/12/2020   Procedure: BIOPSY;  Surgeon: Janalyn Keene NOVAK, MD;  Location: [facility];  Service: Endoscopy;  Laterality: N/A;   CARDIAC CATHETERIZATION N/A 04/17/2016   Procedure: Left Heart Cath and Coronary Angiography;  Surgeon: Deatrice DELENA Cage, MD;  Location: [facility] INV[facility] CV [facility];  Service: Cardiovascular;  Laterality: N/A;   CARDIOVERSION N/A 10/22/2020   Procedure: CARDIOVERSION;  Surgeon: Mady Bruckner, MD;  Location: [facility] OR[facility];  Service: Cardiovascular;  Laterality: N/A;   CHOLECYSTECTOMY     COLON SURGERY  2021   COLONOSCOPY N/A 01/20/2020   Procedure: COLONOSCOPY;  Surgeon: Maryruth Ole ONEIDA, MD;  Location: [facility];  Service: Endoscopy;  Laterality: N/A;   COLONOSCOPY WITH PROPOFOL  N/A 01/12/2020   Procedure: COLONOSCOPY WITH PROPOFOL ;  Surgeon: Janalyn Keene NOVAK, MD;  Location: [facility];  Service: Endoscopy;  Laterality: N/A;  Diabetic - oral meds sleep apnea   ELECTROPHYSIOLOGIC STUDY N/A 05/26/2016   Procedure: Cardioversion;  Surgeon: Deatrice DELENA Cage, MD;  Location: [facility] OR[facility];  Service: Cardiovascular;  Laterality: N/A;   ESOPHAGOGASTRODUODENOSCOPY (EGD) WITH PROPOFOL  N/A 01/12/2020   Procedure: ESOPHAGOGASTRODUODENOSCOPY (EGD) WITH  PROPOFOL ;  Surgeon: Janalyn Keene NOVAK, MD;  Location: [facility];  Service: Endoscopy;  Laterality: N/A;   POLYPECTOMY N/A 01/12/2020   Procedure: POLYPECTOMY;  Surgeon: Janalyn Keene NOVAK, MD;  Location: [facility];  Service: Endoscopy;  Laterality: N/A;   Patient Active Problem List   Diagnosis Date Noted   Mucous cyst of digit of left hand 05/18/2023   Peroneal tendinitis, left 10/13/2022   Left lumbar radiculopathy 07/26/2022   Arthralgia of left knee 07/26/2022   Pes anserinus bursitis of right knee 04/19/2022   Osteoarthritis of both knees 03/27/2022   Greater trochanteric pain syndrome of both lower  extremities 03/27/2022   It band syndrome, right 03/27/2022   Hyperlipidemia associated with type 2 diabetes mellitus (HCC) 05/12/2021   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   Varicose veins of leg with pain, bilateral 07/06/2020   Type II diabetes mellitus with complication (HCC) 01/19/2020   Gastric polyp    Colon polyps    Varicose veins of both lower extremities 10/20/2019   Dysphagia 10/20/2019   Osteoarthritis of right hand 10/31/2016   Nonischemic cardiomyopathy (HCC) 04/14/2016   BMI 33.0-33.9,adult 04/14/2016    PCP: Leita Adie, MD  REFERRING PROVIDER: Selinda Ku, MD  REFERRING DIAG:  [id number] (ICD-10-CM) - Peroneal tendinitis, left  M54.16 (ICD-10-CM) - Left lumbar radiculopathy  M25.562 (ICD-10-CM) - Arthralgia of left knee  M17.11 (ICD-10-CM) - Primary osteoarthritis of right knee  M76.31 (ICD-10-CM) - It band syndrome, right  M25.551 (ICD-10-CM) - Greater 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   Rationale for Evaluation and Treatment: Rehabilitation  THERAPY DIAG: Muscle weakness (generalized)  Pain in right hip  Chronic pain of left knee  ONSET DATE: Chronic, aggravated over Christmas (December 2024)  FOLLOW-UP APPT SCHEDULED WITH REFERRING PROVIDER: No, initially no but now has appt for July;  INITIAL EVALUATION: SUBJECTIVE:                                                                                                                                                                                         SUBJECTIVE STATEMENT:  Left ankle and foot pain  PERTINENT HISTORY: Pt reports pain in left ankle that seems to have flared up over Christmas after pt reported excess walking. Pt reports feeling pulling and burning at the base of the lateral ankle. She has previously been to PT for pain in her left hip and knee but has now noticed increased pain in the left ankle/foot. Pt expresses difficulty with exercises; specifically  pressure on the toes, stretching calves, and rocking on toes. Pt notes pain is as high as 5/10 NPS but is constant and  never really goes away, with lowest pain at 1/10 NPS.  PAIN:    Pain Intensity: Present: 1/10, Best: 1/10, Worst: 5/10 Pain location: Lateral foot and ankle Pain Quality: constant and burning  Radiating: Yes  Swelling: No  Popping, catching, locking: No  Numbness/Tingling: No Focal Weakness: Yes Aggravating factors: Touching lateral ankle, moving the ankle, ascending steps when leading with left foot, walking Relieving factors: Stopping activity, resting, brace temporarily relieves some pain 24-hour pain behavior: No night pain when sleeping on back, stretching in AM helps but feels unstable, PM pain depends on activity during the day History of prior back, hip, knee, or ankle injury, pain, surgery, or therapy: Yes; chronic hip, knee, foot pain Dominant hand: right Imaging: No  Typical footwear: Sneakers or rubber boots Red flags: Negative for personal history of cancer, chills/fever, night sweats, nausea, vomiting, unrelenting pain): Negative  PRECAUTIONS: None  WEIGHT BEARING RESTRICTIONS: No  FALLS: Has patient fallen in last 6 months? 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  OBJECTIVE:   Patient Surveys  08/14/23: FADI: FADI Activities Score: 80 / 104 or 77%  Cognition Patient is oriented  to person, place, and time.  Recent memory is intact.  Remote memory is intact.  Attention span and concentration are intact.  Expressive speech is intact.  Patient's fund of knowledge is within normal limits for educational level.    Gross Musculoskeletal Assessment Bulk: Normal Tone: Normal No trophic changes noted to foot/ankle. No ecchymosis, erythema, or edema noted. No gross ankle/foot deformity noted  GAIT: Comments: Mild toe out gait on right foot. Antalgic gait noted on left side.  Posture: No noted discrepancies.  AROM AROM (Normal range in degrees) AROM       Hip Right Left  Flexion (125) WNL WNL  Extension (15) WNL WNL  Abduction (40) WNL WNL  Adduction  WNL WNL  Internal Rotation (45) WNL WNL  External Rotation (45) WNL WNL      Knee    Flexion (135) WNL WNL  Extension (0) WNL WNL      Ankle    Dorsiflexion (20) 10 (WB) 10 (WB)  Plantarflexion (50) 40 35  Inversion (35) 30 20  Eversion (15) 7 8  (* = pain; Blank rows = not tested)  LE MMT: MMT (out of 5) Right  Left   Hip flexion 4 4  Hip extension 4 4  Hip abduction 4 4+  Hip adduction 4+ 4  Hip internal rotation 5 5  Hip external rotation 5 5  Knee flexion 4 4  Knee extension 5 5  Ankle dorsiflexion 5 5  Ankle plantarflexion    Ankle inversion 5 5*  Ankle eversion 5 5*  (* = pain; Blank rows = not tested)  Sensation Diminished sensation left S1 and lateral aspect of left foot.  Reflexes Deferred  Palpation Location LEFT  RIGHT           Plantar fascia 1 0  Dorsal aspect of foot 1 0  Tarsal tunnel 1 0  (Blank rows = not tested) Graded on 0-4 scale (0 = no pain, 1 = pain, 2 = pain with wincing/grimacing/flinching, 3 = pain with withdrawal, 4 = unwilling to allow palpation), (Blank rows = not tested)  Passive Accessory Motion Superior Tibiofibular Joint: WNL Inferior Tibiofibular Joint: WNL Talocrural Joint Distraction: WNL but noted pain with pulling feeling with left  foot Talocrural Joint AP: L hypomobile, painful where hand is purchased Talocrural Joint PA: L hypomobile  SPECIAL TESTS Ligamentous Integrity Anterior Drawer (ATF, 10-15 plantarflexion with anterior translation): Negative Talar Tilt (CFL, inversion): Negative    TODAY'S TREATMENT: 12/28/2023   Subjective: Pt states that her L knee has been hurting more recently. She also had some cramps in her anterior L ankle over the last 2 nights. Her last loop recorder has not registered any more bouts of atrial fibrillation.    PAIN: Denies resting R hip pain;   Ther-ex  NuStep L1-4 x 10 minutes for warm-up and BLE strengthening during interval history (5 minutes unbilled); LAQ over bolster with manual resistance 2 x 15  BLE; Hooklying bridges 2 x 15; Supine SLR posterior pelvic tilt with 3# AW, 2 x 10 BLE; Hooklying clams with green tband  2 x 15; Hooklying adductor ball squeeze with manual resistance 2 x 15; Supine hip abduction with manual resistance 2 x 15 BLE;   Not today:  Heel raises with UE support x 15 (stopped due to pain in hamstring); DF in WB 06/13/23: 10 degrees BLE Ankle pumps 2 x 30s BLE; Seated ankle circumduction 2 x 30s each direction BLE; Seated resisted PF with green tband x 20 BLE; Seated resisted inversion with green tband x 20 BLE; Seated resisted eversion with green tband x 20 BLE; Supine isometric DF 3 x 20s BLE; Supine isometric PF 3 x 20s BLE; Ankle DF stretch 3 x 30s BLE; Pball straight knee bridges x 10; Prone L knee hamstring curls with manual resistance from therapist 2 x 10; Prone LLE straight knee hip extension 2 x 10; Prone L hip ER with knee at 90 flexion and manual resistance from therapist 2 x 10; R sidelying L hip reverse clam with manual resistance 2 x 10;   PATIENT EDUCATION:  Education details: Pt educated throughout session about proper posture and technique with exercises. Improved exercise technique, movement at target joints, use of  target muscles after min to mod verbal, visual, tactile cues.  Updated HEP Person educated: Patient Education method: Explanation, Tactile cues, Verbal cues, and Handouts Education comprehension: verbalized understanding and returned demonstration   HOME EXERCISE PROGRAM:  Access Code: [email or web address] URL: [email or web address] Date: 12/11/2023 Prepared by: Selinda Eck  Exercises - Seated Ankle Eversion with Resistance  - 1 x daily - 7 x weekly - 2 sets - 10 reps - 3s hold - Seated Ankle Inversion with Resistance and Legs Crossed (Mirrored)  - 1 x daily - 7 x weekly - 2 sets - 10 reps - 3s hold - Seated Ankle Plantarflexion with Resistance  - 1 x daily - 7 x weekly - 2 sets - 10 reps - 3s hold - Long Sitting Calf Stretch with Strap  - 1 x daily - 7 x weekly - 3 reps - 30-45s hold - Standing Gastroc Stretch on Step  - 1 x daily - 7 x weekly - 3 sets - 10 reps - Standing Dorsiflexion Self-Mobilization on Step  - 1 x daily - 7 x weekly - 3 sets - 10 reps -  Standing Dorsiflexion Self-Mobilization on Step (Mirrored)  - 1 x daily - 7 x weekly - 3 sets - 10 reps - Hooklying Isometric Hip Abduction with Belt  - 1 x daily - 7 x weekly - 2 sets - 5 reps - 30s hold/30s relax hold   ASSESSMENT:  CLINICAL IMPRESSION: Progressed strengthening for bilateral hip and LE musculature during session today. She denies any increase in pain during session. No HEP updates at this time. Pt encouraged to follow-up as scheduled. She will benefit from PT services to address deficits in strength and pain in order to improve pain-free function at home and with leisure activities.   OBJECTIVE IMPAIRMENTS: decreased strength and hypomobility.   ACTIVITY LIMITATIONS: stairs and locomotion level  PARTICIPATION LIMITATIONS: occupation and yard work  PERSONAL FACTORS: Age, Behavior pattern, Past/current experiences, Time since onset of injury/illness/exacerbation, and 1-2 comorbidities: type II diabetes and  left lumbar radiculopathy are also affecting patient's functional outcome.   REHAB POTENTIAL: Good  CLINICAL DECISION MAKING: Evolving/moderate complexity  EVALUATION COMPLEXITY: Moderate   GOALS: Goals reviewed with patient? Yes  SHORT TERM GOALS:   Pt will be independent with HEP to improve strength and decrease ankle pain to improve pain-free function at home and work. Baseline:  Goal status: ACHIEVED   LONG TERM GOALS: Target date: 03/04/2024   1.  Pt will decrease worst ankle pain by at least 3 points on the NPRS in order to demonstrate clinically significant reduction in ankle pain. Baseline: 06/08/23: 1/10 best, 5/10 worst; 08/14/23: worst: 1/10; Goal status: ACHIEVED  2.  Pt will increase FADI score by at least 4 points in order demonstrate clinically significant reduction in ankle pain/disability.       Baseline: 06/13/23: 65%; 08/14/23: 80 / 104 or 77% Goal status: ACHIEVED  3.  Pt will improve strength of ankle inversion/eversion with limited to no pain in order to demonstrate improvement in strength and function.  Baseline: 06/08/23: 5/5 with pain; 08/14/23: 5/5 without pain Goal status: ACHIEVED  4.  Pt will report at least 50% improvement in L ankle and knee symptoms in order to demonstrate improvement in her ability to work in the yard/garden with less pain.  Baseline: 08/14/23: 75% improvement Goal status: ACHIEVED  5.  Pt will decrease HAGOS score by at least 20 points in order demonstrate clinically significant reduction in hip pain/disability.  Baseline: 08/14/23: HAGOS Score: 37.7%, 12/11/23: 44.3%  Goal status: PARTIALLY MET  6.  Pt will decrease worst posterior R hip pain by at least 3 points on the NPRS in order to demonstrate clinically significant reduction in hip pain. Baseline: 08/14/23: worst: 8/10; 12/11/23: 2/10; Goal status: ACHIEVED   PLAN: PT FREQUENCY: 1x/week  PT DURATION: 12 weeks  PLANNED INTERVENTIONS: Therapeutic exercises, Therapeutic  activity, Neuromuscular re-education, Balance training, Gait training, Patient/Family education, Self Care, Joint mobilization, Joint manipulation, Vestibular training, Canalith repositioning, Orthotic/Fit training, DME instructions, Dry Needling, Electrical stimulation, Spinal manipulation, Spinal mobilization, Cryotherapy, Moist heat, Taping, Traction, Ultrasound, Ionotophoresis 4mg /ml Dexamethasone , Manual therapy, and Re-evaluation.  PLAN FOR NEXT SESSION: strengthening and mobility exercises focused on R hip pain, review and modify HEP as necessary  Brandyce Dimario D Daiquan Resnik PT, DPT, GCS  11:09 AM,12/28/23

## 2023-12-28 ENCOUNTER — Ambulatory Visit

## 2023-12-28 ENCOUNTER — Encounter

## 2023-12-28 DIAGNOSIS — G8929 Other chronic pain: Secondary | ICD-10-CM

## 2023-12-28 DIAGNOSIS — M25551 Pain in right hip: Secondary | ICD-10-CM

## 2023-12-28 DIAGNOSIS — M25562 Pain in left knee: Secondary | ICD-10-CM | POA: Diagnosis not present

## 2023-12-28 DIAGNOSIS — M6281 Muscle weakness (generalized): Secondary | ICD-10-CM

## 2023-12-31 NOTE — Therapy (Signed)
 OUTPATIENT PHYSICAL THERAPY ANKLE/KNEE/HIP TREATMENT  Patient Name: Brandy [name] MRN: [id number] DOB:04/18/1956, 68 y.o., female Today's Date: 01/03/2024  END OF SESSION:  PT End of Session - 01/03/24 0806     Visit Number 22    Number of Visits 61    Date for PT Re-Evaluation 03/04/24    Authorization Type eval: 06/08/23    PT Start Time 0802    PT Stop Time 0845    PT Time Calculation (min) 43 min    Activity Tolerance Patient tolerated treatment well    Behavior During Therapy [facility] for tasks assessed/performed         Past Medical History:  Diagnosis Date   Acute lower GI bleeding 01/19/2020   Arthritis    Atrial fibrillation with rapid ventricular response (HCC)    Chicken pox    Chronic systolic CHF (congestive heart failure) (HCC)    a. 03/2016 Echo: Ef 15-20%, diff HK, ant AK;  b. 05/2016 Echo: Ef 30-35%, diff HK, mildly dil LA/RA.   Diabetes mellitus without complication (HCC)    Dysrhythmia    GERD (gastroesophageal reflux disease)    Heart murmur    Hip discomfort    been going on for 40 years    History of kidney stones    Hyperlipidemia associated with type 2 diabetes mellitus (HCC) 05/12/2021   Hypertension    Moderate mitral regurgitation    a. 03/2016 Echo: mod MR in setting of LV dysfxn.   Motion sickness    car - back seat   NICM (nonischemic cardiomyopathy) (HCC)    a. 03/2016 Echo: EF 15-20%, diff HK, ant AK, mod MR, mod dil LA, mildly dil RA;  b. 04/2016 Cath: nl cors;  c. 05/2016 Echo: EF 30-35%, diff HK.   Obesity    Obstructive sleep apnea    compliant with CPAP   Persistent atrial fibrillation (HCC)    a. Dx 03/2016;  b. CHA2DS2VASc = 2-->Eliquis  5mg  BID;  b. 05/2016 Failed DCCV x 4.   Sleep apnea    Stomach irritation    Visit for monitoring Tikosyn  therapy 07/24/2016   Past Surgical History:  Procedure Laterality Date   ATRIAL FIBRILLATION ABLATION N/A 12/09/2020   Procedure: ATRIAL FIBRILLATION ABLATION;  Surgeon: Cindie Ole ONEIDA, MD;   Location: [facility];  Service: Cardiovascular;  Laterality: N/A;   BIOPSY N/A 01/12/2020   Procedure: BIOPSY;  Surgeon: Janalyn Keene NOVAK, MD;  Location: [facility];  Service: Endoscopy;  Laterality: N/A;   CARDIAC CATHETERIZATION N/A 04/17/2016   Procedure: Left Heart Cath and Coronary Angiography;  Surgeon: Deatrice DELENA Cage, MD;  Location: [facility] INVASIVE CV LAB;  Service: Cardiovascular;  Laterality: N/A;   CARDIOVERSION N/A 10/22/2020   Procedure: CARDIOVERSION;  Surgeon: Mady Bruckner, MD;  Location: [facility] ORS;  Service: Cardiovascular;  Laterality: N/A;   CHOLECYSTECTOMY     COLON SURGERY  2021   COLONOSCOPY N/A 01/20/2020   Procedure: COLONOSCOPY;  Surgeon: Maryruth Ole ONEIDA, MD;  Location: [facility] ENDOSCOPY;  Service: Endoscopy;  Laterality: N/A;   COLONOSCOPY WITH PROPOFOL  N/A 01/12/2020   Procedure: COLONOSCOPY WITH PROPOFOL ;  Surgeon: Janalyn Keene NOVAK, MD;  Location: [facility];  Service: Endoscopy;  Laterality: N/A;  Diabetic - oral meds sleep apnea   ELECTROPHYSIOLOGIC STUDY N/A 05/26/2016   Procedure: Cardioversion;  Surgeon: Deatrice DELENA Cage, MD;  Location: [facility] OR[facility];  Service: Cardiovascular;  Laterality: N/A;   ESOPHAGOGASTRODUODENOSCOPY (EGD) WITH PROPOFOL  N/A 01/12/2020   Procedure: ESOPHAGOGASTRODUODENOSCOPY (EGD) WITH  PROPOFOL ;  Surgeon: Janalyn Keene NOVAK, MD;  Location: [facility];  Service: Endoscopy;  Laterality: N/A;   POLYPECTOMY N/A 01/12/2020   Procedure: POLYPECTOMY;  Surgeon: Janalyn Keene NOVAK, MD;  Location: [facility];  Service: Endoscopy;  Laterality: N/A;   Patient Active Problem List   Diagnosis Date Noted   Mucous cyst of digit of left hand 05/18/2023   Peroneal tendinitis, left 10/13/2022   Left lumbar radiculopathy 07/26/2022   Arthralgia of left knee 07/26/2022   Pes anserinus bursitis of right knee 04/19/2022   Osteoarthritis of both knees 03/27/2022   Greater trochanteric pain syndrome of both lower  extremities 03/27/2022   It band syndrome, right 03/27/2022   Hyperlipidemia associated with type 2 diabetes mellitus (HCC) 05/12/2021   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   Varicose veins of leg with pain, bilateral 07/06/2020   Type II diabetes mellitus with complication (HCC) 01/19/2020   Gastric polyp    Colon polyps    Varicose veins of both lower extremities 10/20/2019   Dysphagia 10/20/2019   Osteoarthritis of right hand 10/31/2016   Nonischemic cardiomyopathy (HCC) 04/14/2016   BMI 33.0-33.9,adult 04/14/2016    PCP: Leita Adie, MD  REFERRING PROVIDER: Selinda Ku, MD  REFERRING DIAG:  [phone] (ICD-10-CM) - Peroneal tendinitis, left  M54.16 (ICD-10-CM) - Left lumbar radiculopathy  M25.562 (ICD-10-CM) - Arthralgia of left knee  M17.11 (ICD-10-CM) - Primary osteoarthritis of right knee  M76.31 (ICD-10-CM) - It band syndrome, right  M25.551 (ICD-10-CM) - Greater 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   Rationale for Evaluation and Treatment: Rehabilitation  THERAPY DIAG: Muscle weakness (generalized)  Pain in right hip  Chronic pain of left knee  ONSET DATE: Chronic, aggravated over Christmas (December 2024)  FOLLOW-UP APPT SCHEDULED WITH REFERRING PROVIDER: No, initially no but now has appt for July;  INITIAL EVALUATION: SUBJECTIVE:                                                                                                                                                                                         SUBJECTIVE STATEMENT:  Left ankle and foot pain  PERTINENT HISTORY: Pt reports pain in left ankle that seems to have flared up over Christmas after pt reported excess walking. Pt reports feeling pulling and burning at the base of the lateral ankle. She has previously been to PT for pain in her left hip and knee but has now noticed increased pain in the left ankle/foot. Pt expresses difficulty with exercises; specifically  pressure on the toes, stretching calves, and rocking on toes. Pt notes pain is as high as 5/10 NPS but is constant and  never really goes away, with lowest pain at 1/10 NPS.  PAIN:    Pain Intensity: Present: 1/10, Best: 1/10, Worst: 5/10 Pain location: Lateral foot and ankle Pain Quality: constant and burning  Radiating: Yes  Swelling: No  Popping, catching, locking: No  Numbness/Tingling: No Focal Weakness: Yes Aggravating factors: Touching lateral ankle, moving the ankle, ascending steps when leading with left foot, walking Relieving factors: Stopping activity, resting, brace temporarily relieves some pain 24-hour pain behavior: No night pain when sleeping on back, stretching in AM helps but feels unstable, PM pain depends on activity during the day History of prior back, hip, knee, or ankle injury, pain, surgery, or therapy: Yes; chronic hip, knee, foot pain Dominant hand: right Imaging: No  Typical footwear: Sneakers or rubber boots Red flags: Negative for personal history of cancer, chills/fever, night sweats, nausea, vomiting, unrelenting pain): Negative  PRECAUTIONS: None  WEIGHT BEARING RESTRICTIONS: No  FALLS: Has patient fallen in last 6 months? 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  OBJECTIVE:   Patient Surveys  08/14/23: FADI: FADI Activities Score: 80 / 104 or 77%  Cognition Patient is oriented  to person, place, and time.  Recent memory is intact.  Remote memory is intact.  Attention span and concentration are intact.  Expressive speech is intact.  Patient's fund of knowledge is within normal limits for educational level.    Gross Musculoskeletal Assessment Bulk: Normal Tone: Normal No trophic changes noted to foot/ankle. No ecchymosis, erythema, or edema noted. No gross ankle/foot deformity noted  GAIT: Comments: Mild toe out gait on right foot. Antalgic gait noted on left side.  Posture: No noted discrepancies.  AROM AROM (Normal range in degrees) AROM       Hip Right Left  Flexion (125) WNL WNL  Extension (15) WNL WNL  Abduction (40) WNL WNL  Adduction  WNL WNL  Internal Rotation (45) WNL WNL  External Rotation (45) WNL WNL      Knee    Flexion (135) WNL WNL  Extension (0) WNL WNL      Ankle    Dorsiflexion (20) 10 (WB) 10 (WB)  Plantarflexion (50) 40 35  Inversion (35) 30 20  Eversion (15) 7 8  (* = pain; Blank rows = not tested)  LE MMT: MMT (out of 5) Right  Left   Hip flexion 4 4  Hip extension 4 4  Hip abduction 4 4+  Hip adduction 4+ 4  Hip internal rotation 5 5  Hip external rotation 5 5  Knee flexion 4 4  Knee extension 5 5  Ankle dorsiflexion 5 5  Ankle plantarflexion    Ankle inversion 5 5*  Ankle eversion 5 5*  (* = pain; Blank rows = not tested)  Sensation Diminished sensation left S1 and lateral aspect of left foot.  Reflexes Deferred  Palpation Location LEFT  RIGHT           Plantar fascia 1 0  Dorsal aspect of foot 1 0  Tarsal tunnel 1 0  (Blank rows = not tested) Graded on 0-4 scale (0 = no pain, 1 = pain, 2 = pain with wincing/grimacing/flinching, 3 = pain with withdrawal, 4 = unwilling to allow palpation), (Blank rows = not tested)  Passive Accessory Motion Superior Tibiofibular Joint: WNL Inferior Tibiofibular Joint: WNL Talocrural Joint Distraction: WNL but noted pain with pulling feeling with left  foot Talocrural Joint AP: L hypomobile, painful where hand is purchased Talocrural Joint PA: L hypomobile  SPECIAL TESTS Ligamentous Integrity Anterior Drawer (ATF, 10-15 plantarflexion with anterior translation): Negative Talar Tilt (CFL, inversion): Negative    TODAY'S TREATMENT: 12/28/2023   Subjective: Pt reports increase in L knee instability recently. L ankle cramps have improved with her foot arch wrap. She has not received any more reports of atrial fibrillation from the loop recorder monitoring.   PAIN: Denies resting R hip pain;   Ther-ex  NuStep L1-4 x 10 minutes for warm-up and BLE strengthening during interval history (5 minutes unbilled); Total Gym L22 double leg partial squats with green tband around thighs 2 x 10 BLE; Hooklying SLR with 3# AW 2 x 10 BLE; Pball bridges with arms in front of chest 2 x 10; Hooklying bridge march with green tband around knees x 10; Hooklying clams with green tband 2 x 15; Hooklying adductor ball squeeze 2 x 15;   Not today:  Heel raises with UE support x 15 (stopped due to pain in hamstring); DF in WB 06/13/23: 10 degrees BLE Ankle pumps 2 x 30s BLE; Seated ankle circumduction 2 x 30s each direction BLE; Seated resisted PF with green tband x 20 BLE; Seated resisted inversion with green tband x 20 BLE; Seated resisted eversion with green tband x 20 BLE; Supine isometric DF 3 x 20s BLE; Supine isometric PF 3 x 20s BLE; Ankle DF stretch 3 x 30s BLE; LAQ over bolster with manual resistance 2 x 15  BLE; Prone L knee hamstring curls with manual resistance from therapist 2 x 10; Prone LLE straight knee hip extension 2 x 10; Prone L hip ER with knee at 90 flexion and manual resistance from therapist 2 x 10; R sidelying L hip reverse clam with manual resistance 2 x 10; Supine hip abduction with manual resistance 2 x 15 BLE;   PATIENT EDUCATION:  Education details: Pt educated throughout session about proper posture and technique  with exercises. Improved exercise technique, movement at target joints, use of target muscles after min to mod verbal, visual, tactile cues.  Updated HEP Person educated: Patient Education method: Explanation, Tactile cues, Verbal cues, and Handouts Education comprehension: verbalized understanding and returned demonstration   HOME EXERCISE PROGRAM:  Access Code: [email or web address] URL: [email or web address] Date: 12/11/2023 Prepared by: Selinda Eck  Exercises - Seated Ankle Eversion with Resistance  - 1 x daily - 7 x weekly - 2 sets - 10 reps - 3s hold - Seated Ankle Inversion with Resistance and Legs Crossed (Mirrored)  - 1 x daily - 7 x weekly - 2 sets - 10 reps - 3s hold - Seated Ankle Plantarflexion with Resistance  - 1 x daily - 7 x weekly - 2 sets - 10 reps - 3s hold - Long Sitting Calf Stretch with Strap  - 1 x daily - 7 x weekly - 3 reps - 30-45s hold - Standing Gastroc Stretch on Step  - 1 x daily - 7 x weekly - 3 sets - 10 reps - Standing Dorsiflexion Self-Mobilization on Step  - 1  x daily - 7 x weekly - 3 sets - 10 reps - Standing Dorsiflexion Self-Mobilization on Step (Mirrored)  - 1 x daily - 7 x weekly - 3 sets - 10 reps - Hooklying Isometric Hip Abduction with Belt  - 1 x daily - 7 x weekly - 2 sets - 5 reps - 30s hold/30s relax hold   ASSESSMENT:  CLINICAL IMPRESSION: Progressed strengthening for bilateral hip and LE musculature during session today. She denies any increase in pain during session. Reintroduced Total Gym squats. No HEP updates at this time. Pt encouraged to follow-up as scheduled. She will benefit from PT services to address deficits in strength and pain in order to improve pain-free function at home and with leisure activities.   OBJECTIVE IMPAIRMENTS: decreased strength and hypomobility.   ACTIVITY LIMITATIONS: stairs and locomotion level  PARTICIPATION LIMITATIONS: occupation and yard work  PERSONAL FACTORS: Age, Behavior pattern,  Past/current experiences, Time since onset of injury/illness/exacerbation, and 1-2 comorbidities: type II diabetes and left lumbar radiculopathy are also affecting patient's functional outcome.   REHAB POTENTIAL: Good  CLINICAL DECISION MAKING: Evolving/moderate complexity  EVALUATION COMPLEXITY: Moderate   GOALS: Goals reviewed with patient? Yes  SHORT TERM GOALS:   Pt will be independent with HEP to improve strength and decrease ankle pain to improve pain-free function at home and work. Baseline:  Goal status: ACHIEVED   LONG TERM GOALS: Target date: 03/04/2024   1.  Pt will decrease worst ankle pain by at least 3 points on the NPRS in order to demonstrate clinically significant reduction in ankle pain. Baseline: 06/08/23: 1/10 best, 5/10 worst; 08/14/23: worst: 1/10; Goal status: ACHIEVED  2.  Pt will increase FADI score by at least 4 points in order demonstrate clinically significant reduction in ankle pain/disability.       Baseline: 06/13/23: 65%; 08/14/23: 80 / 104 or 77% Goal status: ACHIEVED  3.  Pt will improve strength of ankle inversion/eversion with limited to no pain in order to demonstrate improvement in strength and function.  Baseline: 06/08/23: 5/5 with pain; 08/14/23: 5/5 without pain Goal status: ACHIEVED  4.  Pt will report at least 50% improvement in L ankle and knee symptoms in order to demonstrate improvement in her ability to work in the yard/garden with less pain.  Baseline: 08/14/23: 75% improvement Goal status: ACHIEVED  5.  Pt will decrease HAGOS score by at least 20 points in order demonstrate clinically significant reduction in hip pain/disability.  Baseline: 08/14/23: HAGOS Score: 37.7%, 12/11/23: 44.3%  Goal status: PARTIALLY MET  6.  Pt will decrease worst posterior R hip pain by at least 3 points on the NPRS in order to demonstrate clinically significant reduction in hip pain. Baseline: 08/14/23: worst: 8/10; 12/11/23: 2/10; Goal status:  ACHIEVED   PLAN: PT FREQUENCY: 1x/week  PT DURATION: 12 weeks  PLANNED INTERVENTIONS: Therapeutic exercises, Therapeutic activity, Neuromuscular re-education, Balance training, Gait training, Patient/Family education, Self Care, Joint mobilization, Joint manipulation, Vestibular training, Canalith repositioning, Orthotic/Fit training, DME instructions, Dry Needling, Electrical stimulation, Spinal manipulation, Spinal mobilization, Cryotherapy, Moist heat, Taping, Traction, Ultrasound, Ionotophoresis 4mg /ml Dexamethasone , Manual therapy, and Re-evaluation.  PLAN FOR NEXT SESSION: strengthening and mobility exercises focused on R hip pain, review and modify HEP as necessary  Sandon Yoho D Charmayne Odell PT, DPT, GCS  1:03 PM,01/03/24

## 2024-01-03 ENCOUNTER — Ambulatory Visit

## 2024-01-03 DIAGNOSIS — G8929 Other chronic pain: Secondary | ICD-10-CM

## 2024-01-03 DIAGNOSIS — M25551 Pain in right hip: Secondary | ICD-10-CM

## 2024-01-03 DIAGNOSIS — M25562 Pain in left knee: Secondary | ICD-10-CM | POA: Diagnosis not present

## 2024-01-03 DIAGNOSIS — M6281 Muscle weakness (generalized): Secondary | ICD-10-CM | POA: Diagnosis not present

## 2024-01-04 ENCOUNTER — Encounter

## 2024-01-05 NOTE — Therapy (Unsigned)
 OUTPATIENT PHYSICAL THERAPY ANKLE/KNEE/HIP TREATMENT  Patient Name: Brandy [name] MRN: [id number] DOB:1956/05/02, 68 y.o., female Today's Date: 01/10/2024  END OF SESSION:   Past Medical History:  Diagnosis Date   Acute lower GI bleeding 01/19/2020   Arthritis    Atrial fibrillation with rapid ventricular response (HCC)    Chicken pox    Chronic systolic CHF (congestive heart failure) (HCC)    a. 03/2016 Echo: Ef 15-20%, diff HK, ant AK;  b. 05/2016 Echo: Ef 30-35%, diff HK, mildly dil LA/RA.   Diabetes mellitus without complication (HCC)    Dysrhythmia    GERD (gastroesophageal reflux disease)    Heart murmur    Hip discomfort    been going on for 40 years    History of kidney stones    Hyperlipidemia associated with type 2 diabetes mellitus (HCC) 05/12/2021   Hypertension    Moderate mitral regurgitation    a. 03/2016 Echo: mod MR in setting of LV dysfxn.   Motion sickness    car - back seat   NICM (nonischemic cardiomyopathy) (HCC)    a. 03/2016 Echo: EF 15-20%, diff HK, ant AK, mod MR, mod dil LA, mildly dil RA;  b. 04/2016 Cath: nl cors;  c. 05/2016 Echo: EF 30-35%, diff HK.   Obesity    Obstructive sleep apnea    compliant with CPAP   Persistent atrial fibrillation (HCC)    a. Dx 03/2016;  b. CHA2DS2VASc = 2-->Eliquis  5mg  BID;  b. 05/2016 Failed DCCV x 4.   Sleep apnea    Stomach irritation    Visit for monitoring Tikosyn  therapy 07/24/2016   Past Surgical History:  Procedure Laterality Date   ATRIAL FIBRILLATION ABLATION N/A 12/09/2020   Procedure: ATRIAL FIBRILLATION ABLATION;  Surgeon: Cindie Ole ONEIDA, MD;  Location: [facility];  Service: Cardiovascular;  Laterality: N/A;   BIOPSY N/A 01/12/2020   Procedure: BIOPSY;  Surgeon: Janalyn Keene NOVAK, MD;  Location: [facility];  Service: Endoscopy;  Laterality: N/A;   CARDIAC CATHETERIZATION N/A 04/17/2016   Procedure: Left Heart Cath and Coronary Angiography;  Surgeon: Deatrice DELENA Cage, MD;  Location:  [facility] INV[facility] CV [facility];  Service: Cardiovascular;  Laterality: N/A;   CARDIOVERSION N/A 10/22/2020   Procedure: CARDIOVERSION;  Surgeon: Mady Bruckner, MD;  Location: [facility] ORS;  Service: Cardiovascular;  Laterality: N/A;   CHOLECYSTECTOMY     COLON SURGERY  2021   COLONOSCOPY N/A 01/20/2020   Procedure: COLONOSCOPY;  Surgeon: Maryruth Ole ONEIDA, MD;  Location: [facility] ENDOS[facility];  Service: Endoscopy;  Laterality: N/A;   COLONOSCOPY WITH PROPOFOL  N/A 01/12/2020   Procedure: COLONOSCOPY WITH PROPOFOL ;  Surgeon: Janalyn Keene NOVAK, MD;  Location: [facility];  Service: Endoscopy;  Laterality: N/A;  Diabetic - oral meds sleep apnea   ELECTROPHYSIOLOGIC STUDY N/A 05/26/2016   Procedure: Cardioversion;  Surgeon: Deatrice DELENA Cage, MD;  Location: [facility] OR[facility];  Service: Cardiovascular;  Laterality: N/A;   ESOPHAGOGASTRODUODENOSCOPY (EGD) WITH PROPOFOL  N/A 01/12/2020   Procedure: ESOPHAGOGASTRODUODENOSCOPY (EGD) WITH PROPOFOL ;  Surgeon: Janalyn Keene NOVAK, MD;  Location: [facility];  Service: Endoscopy;  Laterality: N/A;   POLYPECTOMY N/A 01/12/2020   Procedure: POLYPECTOMY;  Surgeon: Janalyn Keene NOVAK, MD;  Location: [facility];  Service: Endoscopy;  Laterality: N/A;   Patient Active Problem List   Diagnosis Date Noted   Mucous cyst of digit of left hand 05/18/2023   Peroneal tendinitis, left 10/13/2022   Left lumbar radiculopathy 07/26/2022   Arthralgia of left knee 07/26/2022  Pes anserinus bursitis of right knee 04/19/2022   Osteoarthritis of both knees 03/27/2022   Greater trochanteric pain syndrome of both lower extremities 03/27/2022   It band syndrome, right 03/27/2022   Hyperlipidemia associated with type 2 diabetes mellitus (HCC) 05/12/2021   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   Varicose veins of leg with pain, bilateral 07/06/2020   Type II diabetes mellitus with complication (HCC) 01/19/2020   Gastric polyp    Colon polyps    Varicose veins of  both lower extremities 10/20/2019   Dysphagia 10/20/2019   Osteoarthritis of right hand 10/31/2016   Nonischemic cardiomyopathy (HCC) 04/14/2016   BMI 33.0-33.9,adult 04/14/2016    PCP: Leita Adie, MD  REFERRING PROVIDER: Selinda Ku, MD  REFERRING DIAG:  [phone] (ICD-10-CM) - Peroneal tendinitis, left  M54.16 (ICD-10-CM) - Left lumbar radiculopathy  M25.562 (ICD-10-CM) - Arthralgia of left knee  M17.11 (ICD-10-CM) - Primary osteoarthritis of right knee  M76.31 (ICD-10-CM) - It band syndrome, right  M25.551 (ICD-10-CM) - Greater 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   Rationale for Evaluation and Treatment: Rehabilitation  THERAPY DIAG: Muscle weakness (generalized)  Pain in right hip  Chronic pain of left knee  ONSET DATE: Chronic, aggravated over Christmas (December 2024)  FOLLOW-UP APPT SCHEDULED WITH REFERRING PROVIDER: No, initially no but now has appt for July;  INITIAL EVALUATION: SUBJECTIVE:                                                                                                                                                                                         SUBJECTIVE STATEMENT:  Left ankle and foot pain  PERTINENT HISTORY: Pt reports pain in left ankle that seems to have flared up over Christmas after pt reported excess walking. Pt reports feeling pulling and burning at the base of the lateral ankle. She has previously been to PT for pain in her left hip and knee but has now noticed increased pain in the left ankle/foot. Pt expresses difficulty with exercises; specifically pressure on the toes, stretching calves, and rocking on toes. Pt notes pain is as high as 5/10 NPS but is constant and never really goes away, with lowest pain at 1/10 NPS.  PAIN:    Pain Intensity: Present: 1/10, Best: 1/10, Worst: 5/10 Pain location: Lateral foot and ankle Pain Quality: constant and burning  Radiating: Yes  Swelling: No  Popping,  catching, locking: No  Numbness/Tingling: No Focal Weakness: Yes Aggravating factors: Touching lateral ankle, moving the ankle, ascending steps when leading with left foot, walking Relieving factors: Stopping activity, resting, brace temporarily relieves some pain 24-hour pain behavior: No night pain when sleeping on  back, stretching in AM helps but feels unstable, PM pain depends on activity during the day History of prior back, hip, knee, or ankle injury, pain, surgery, or therapy: Yes; chronic hip, knee, foot pain Dominant hand: right Imaging: No  Typical footwear: Sneakers or rubber boots Red flags: Negative for personal history of cancer, chills/fever, night sweats, nausea, vomiting, unrelenting pain): Negative  PRECAUTIONS: None  WEIGHT BEARING RESTRICTIONS: No  FALLS: Has patient fallen in last 6 months? 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  OBJECTIVE:   Patient Surveys  08/14/23: FADI: FADI Activities Score: 80 / 104 or 77%  Cognition Patient is oriented to person, place, and time.  Recent memory is intact.  Remote memory is intact.  Attention span and concentration are intact.  Expressive speech is intact.  Patient's fund of knowledge is within normal limits for educational level.    Gross Musculoskeletal Assessment Bulk: Normal Tone: Normal No trophic changes noted to foot/ankle. No ecchymosis,  erythema, or edema noted. No gross ankle/foot deformity noted  GAIT: Comments: Mild toe out gait on right foot. Antalgic gait noted on left side.  Posture: No noted discrepancies.  AROM AROM (Normal range in degrees) AROM       Hip Right Left  Flexion (125) WNL WNL  Extension (15) WNL WNL  Abduction (40) WNL WNL  Adduction  WNL WNL  Internal Rotation (45) WNL WNL  External Rotation (45) WNL WNL      Knee    Flexion (135) WNL WNL  Extension (0) WNL WNL      Ankle    Dorsiflexion (20) 10 (WB) 10 (WB)  Plantarflexion (50) 40 35  Inversion (35) 30 20  Eversion (15) 7 8  (* = pain; Blank rows = not tested)  LE MMT: MMT (out of 5) Right  Left   Hip flexion 4 4  Hip extension 4 4  Hip abduction 4 4+  Hip adduction 4+ 4  Hip internal rotation 5 5  Hip external rotation 5 5  Knee flexion 4 4  Knee extension 5 5  Ankle dorsiflexion 5 5  Ankle plantarflexion    Ankle inversion 5 5*  Ankle eversion 5 5*  (* = pain; Blank rows = not tested)  Sensation Diminished sensation left S1 and lateral aspect of left foot.  Reflexes Deferred  Palpation Location LEFT  RIGHT           Plantar fascia 1 0  Dorsal aspect of foot 1 0  Tarsal tunnel 1 0  (Blank rows = not tested) Graded on 0-4 scale (0 = no pain, 1 = pain, 2 = pain with wincing/grimacing/flinching, 3 = pain with withdrawal, 4 = unwilling to allow palpation), (Blank rows = not tested)  Passive Accessory Motion Superior Tibiofibular Joint: WNL Inferior Tibiofibular Joint: WNL Talocrural Joint Distraction: WNL but noted pain with pulling feeling with left foot Talocrural Joint AP: L hypomobile, painful where hand is purchased Talocrural Joint PA: L hypomobile  SPECIAL TESTS  Ligamentous Integrity Anterior Drawer (ATF, 10-15 plantarflexion with anterior translation): Negative Talar Tilt (CFL, inversion): Negative    TODAY'S TREATMENT: 01/08/2024   Subjective: Pt reports L knee catching with pain recently.  She has not received any more reports of atrial fibrillation from the loop recorder monitoring. No specific questions or concerns currently.   PAIN: Denies resting R hip pain, 2/10 L knee pain;   Ther-ex  NuStep L1-4 x 10 minutes for warm-up and BLE strengthening during interval history (5 minutes unbilled); Total Gym L22 double leg partial squats with green tband around thighs 2 x 10 BLE; Hooklying SLR with 3# AW 2 x 10 BLE; Hooklying bridge with green tband around knees x 10; Hooklying bridge march with green tband around knees x 10 BLE; Pball bridges with arms in front of chest 2 x 10; Hooklying clams with green tband 2 x 15; Hooklying adductor ball squeeze 2 x 15;   Not today:  Heel raises with UE support x 15 (stopped due to pain in hamstring); DF in WB 06/13/23: 10 degrees BLE Ankle pumps 2 x 30s BLE; Seated ankle circumduction 2 x 30s each direction BLE; Seated resisted PF with green tband x 20 BLE; Seated resisted inversion with green tband x 20 BLE; Seated resisted eversion with green tband x 20 BLE; Supine isometric DF 3 x 20s BLE; Supine isometric PF 3 x 20s BLE; Ankle DF stretch 3 x 30s BLE; LAQ over bolster with manual resistance 2 x 15  BLE; Prone L knee hamstring curls with manual resistance from therapist 2 x 10; Prone LLE straight knee hip extension 2 x 10; Prone L hip ER with knee at 90 flexion and manual resistance from therapist 2 x 10; R sidelying L hip reverse clam with manual resistance 2 x 10; Supine hip abduction with manual resistance 2 x 15 BLE;   PATIENT EDUCATION:  Education details: Pt educated throughout session about proper posture and technique with exercises. Improved exercise technique, movement at target joints, use of target muscles after min to mod verbal, visual, tactile cues.  Person educated: Patient Education method: Explanation, Actor cues, Verbal cues, and Handouts Education comprehension: verbalized understanding and returned  demonstration   HOME EXERCISE PROGRAM:  Access Code: [email or web address] URL: [email or web address] Date: 12/11/2023 Prepared by: Selinda Eck  Exercises - Seated Ankle Eversion with Resistance  - 1 x daily - 7 x weekly - 2 sets - 10 reps - 3s hold - Seated Ankle Inversion with Resistance and Legs Crossed (Mirrored)  - 1 x daily - 7 x weekly - 2 sets - 10 reps - 3s hold - Seated Ankle Plantarflexion with Resistance  - 1 x daily - 7 x weekly - 2 sets - 10 reps - 3s hold - Long Sitting Calf Stretch with Strap  - 1 x daily - 7 x weekly - 3 reps - 30-45s hold - Standing Gastroc Stretch on Step  - 1 x daily - 7 x weekly - 3 sets - 10 reps - Standing Dorsiflexion Self-Mobilization on Step  - 1 x daily - 7 x weekly - 3 sets - 10 reps - Standing Dorsiflexion Self-Mobilization on Step (Mirrored)  - 1 x daily - 7 x weekly - 3 sets - 10 reps - Hooklying Isometric Hip Abduction with Belt  - 1 x daily - 7 x weekly - 2 sets - 5 reps - 30s hold/30s relax hold   ASSESSMENT:  CLINICAL IMPRESSION: Continued mat table strengthening for bilateral hip and LE musculature  during session today in order to not irritate hip pain. Attempted resisted SAQ but due to increase in L knee pain discontinued. No HEP updates at this time. Pt encouraged to follow-up as scheduled. She will benefit from PT services to address deficits in strength and pain in order to improve pain-free function at home and with leisure activities.   OBJECTIVE IMPAIRMENTS: decreased strength and hypomobility.   ACTIVITY LIMITATIONS: stairs and locomotion level  PARTICIPATION LIMITATIONS: occupation and yard work  PERSONAL FACTORS: Age, Behavior pattern, Past/current experiences, Time since onset of injury/illness/exacerbation, and 1-2 comorbidities: type II diabetes and left lumbar radiculopathy are also affecting patient's functional outcome.   REHAB POTENTIAL: Good  CLINICAL DECISION MAKING: Evolving/moderate  complexity  EVALUATION COMPLEXITY: Moderate   GOALS: Goals reviewed with patient? Yes  SHORT TERM GOALS:   Pt will be independent with HEP to improve strength and decrease ankle pain to improve pain-free function at home and work. Baseline:  Goal status: ACHIEVED   LONG TERM GOALS: Target date: 03/04/2024   1.  Pt will decrease worst ankle pain by at least 3 points on the NPRS in order to demonstrate clinically significant reduction in ankle pain. Baseline: 06/08/23: 1/10 best, 5/10 worst; 08/14/23: worst: 1/10; Goal status: ACHIEVED  2.  Pt will increase FADI score by at least 4 points in order demonstrate clinically significant reduction in ankle pain/disability.       Baseline: 06/13/23: 65%; 08/14/23: 80 / 104 or 77% Goal status: ACHIEVED  3.  Pt will improve strength of ankle inversion/eversion with limited to no pain in order to demonstrate improvement in strength and function.  Baseline: 06/08/23: 5/5 with pain; 08/14/23: 5/5 without pain Goal status: ACHIEVED  4.  Pt will report at least 50% improvement in L ankle and knee symptoms in order to demonstrate improvement in her ability to work in the yard/garden with less pain.  Baseline: 08/14/23: 75% improvement Goal status: ACHIEVED  5.  Pt will decrease HAGOS score by at least 20 points in order demonstrate clinically significant reduction in hip pain/disability.  Baseline: 08/14/23: HAGOS Score: 37.7%, 12/11/23: 44.3%  Goal status: PARTIALLY MET  6.  Pt will decrease worst posterior R hip pain by at least 3 points on the NPRS in order to demonstrate clinically significant reduction in hip pain. Baseline: 08/14/23: worst: 8/10; 12/11/23: 2/10; Goal status: ACHIEVED   PLAN: PT FREQUENCY: 1x/week  PT DURATION: 12 weeks  PLANNED INTERVENTIONS: Therapeutic exercises, Therapeutic activity, Neuromuscular re-education, Balance training, Gait training, Patient/Family education, Self Care, Joint mobilization, Joint manipulation,  Vestibular training, Canalith repositioning, Orthotic/Fit training, DME instructions, Dry Needling, Electrical stimulation, Spinal manipulation, Spinal mobilization, Cryotherapy, Moist heat, Taping, Traction, Ultrasound, Ionotophoresis 4mg /ml Dexamethasone , Manual therapy, and Re-evaluation.  PLAN FOR NEXT SESSION: strengthening and mobility exercises focused on R hip pain, review and modify HEP as necessary  Sarajean Dessert D Jonanthan Bolender PT, DPT, GCS  8:20 AM,01/10/24

## 2024-01-08 ENCOUNTER — Ambulatory Visit

## 2024-01-08 DIAGNOSIS — M6281 Muscle weakness (generalized): Secondary | ICD-10-CM

## 2024-01-08 DIAGNOSIS — G8929 Other chronic pain: Secondary | ICD-10-CM | POA: Diagnosis not present

## 2024-01-08 DIAGNOSIS — M25551 Pain in right hip: Secondary | ICD-10-CM | POA: Diagnosis not present

## 2024-01-08 DIAGNOSIS — M25562 Pain in left knee: Secondary | ICD-10-CM | POA: Diagnosis not present

## 2024-01-11 ENCOUNTER — Ambulatory Visit: Admitting: Internal Medicine

## 2024-01-15 ENCOUNTER — Telehealth: Payer: Self-pay | Admitting: Physician Assistant

## 2024-01-15 ENCOUNTER — Other Ambulatory Visit (HOSPITAL_COMMUNITY): Payer: Self-pay

## 2024-01-15 ENCOUNTER — Telehealth: Payer: Self-pay

## 2024-01-15 NOTE — Telephone Encounter (Signed)
 Call received from Pt.  She is wanting to know if she is back in rhythm.  Has a DCCV scheduled for this week 01/17/2024.  Per review of [facility], Pt is still in Afib.  Advised Pt.  All questions answered.

## 2024-01-15 NOTE — Telephone Encounter (Signed)
 Patient states she took her Farxiga  this morning. Will need DCCV rescheduled to have Farxiga  held for 3 days prior.  Rescheduled DCCV for 01/18/24, arrival time 10:30 AM.  Reminded patient to not take any more Farxiga  until after her DCCV, and do not miss any doses of Eliquis . Denies any missed doses of Eliquis . She will request refill from pharmacy 6 of 6 remaining.  Patient verbalized understanding and expressed appreciation for call.

## 2024-01-15 NOTE — Telephone Encounter (Signed)
 Pt called in stating she was supposed to stop taking Farxiga  prior to Cardioversion and asked if she will need to push Cardioversion back. She states she took it this morning, she was supposed to hold for 3 days.   She also asked if she should continue taking Eliquis  and if so she needs a refill. She uses [facility] pharmacy downstairs - [address]

## 2024-01-17 ENCOUNTER — Other Ambulatory Visit (HOSPITAL_COMMUNITY): Payer: Self-pay

## 2024-01-17 DIAGNOSIS — I4819 Other persistent atrial fibrillation: Secondary | ICD-10-CM

## 2024-01-17 NOTE — Progress Notes (Signed)
 Spoke to patient and instructed them to come at 0945  and to be NPO after 0000.     Confirmed that patient will have a ride home and someone to stay with them for 24 hours after the procedure.   Medications reviewed.  Confirmed blood thinner.  Confirmed no breaks in taking blood thinner for 3+ weeks prior to procedure. Confirmed patient stopped all GLP-1s and GLP-2s for at least one week before procedure.

## 2024-01-18 ENCOUNTER — Ambulatory Visit (HOSPITAL_COMMUNITY)
Admission: RE | Admit: 2024-01-18 | Discharge: 2024-01-18 | Disposition: A | Discharge status: Home / Self Care | Attending: Cardiology | Admitting: Cardiology

## 2024-01-18 ENCOUNTER — Encounter (HOSPITAL_COMMUNITY)
Admission: RE | Disposition: A | Discharge status: Home / Self Care | Payer: Self-pay | Source: Home / Self Care | Attending: Cardiology

## 2024-01-18 ENCOUNTER — Ambulatory Visit (HOSPITAL_COMMUNITY): Admitting: Anesthesiology

## 2024-01-18 ENCOUNTER — Other Ambulatory Visit: Payer: Self-pay

## 2024-01-18 ENCOUNTER — Ambulatory Visit

## 2024-01-18 DIAGNOSIS — Z7984 Long term (current) use of oral hypoglycemic drugs: Secondary | ICD-10-CM | POA: Diagnosis not present

## 2024-01-18 DIAGNOSIS — G4733 Obstructive sleep apnea (adult) (pediatric): Secondary | ICD-10-CM | POA: Diagnosis not present

## 2024-01-18 DIAGNOSIS — K219 Gastro-esophageal reflux disease without esophagitis: Secondary | ICD-10-CM | POA: Diagnosis not present

## 2024-01-18 DIAGNOSIS — I110 Hypertensive heart disease with heart failure: Secondary | ICD-10-CM | POA: Diagnosis not present

## 2024-01-18 DIAGNOSIS — I4891 Unspecified atrial fibrillation: Secondary | ICD-10-CM | POA: Diagnosis not present

## 2024-01-18 DIAGNOSIS — I428 Other cardiomyopathies: Secondary | ICD-10-CM

## 2024-01-18 DIAGNOSIS — I4819 Other persistent atrial fibrillation: Secondary | ICD-10-CM | POA: Diagnosis not present

## 2024-01-18 DIAGNOSIS — I2510 Atherosclerotic heart disease of native coronary artery without angina pectoris: Secondary | ICD-10-CM | POA: Diagnosis not present

## 2024-01-18 DIAGNOSIS — I4811 Longstanding persistent atrial fibrillation: Secondary | ICD-10-CM | POA: Insufficient documentation

## 2024-01-18 DIAGNOSIS — E119 Type 2 diabetes mellitus without complications: Secondary | ICD-10-CM | POA: Diagnosis not present

## 2024-01-18 DIAGNOSIS — Z7901 Long term (current) use of anticoagulants: Secondary | ICD-10-CM | POA: Insufficient documentation

## 2024-01-18 DIAGNOSIS — I5042 Chronic combined systolic (congestive) and diastolic (congestive) heart failure: Secondary | ICD-10-CM | POA: Insufficient documentation

## 2024-01-18 DIAGNOSIS — Z79899 Other long term (current) drug therapy: Secondary | ICD-10-CM | POA: Insufficient documentation

## 2024-01-18 HISTORY — PX: CARDIOVERSION: EP1203

## 2024-01-18 SURGERY — CARDIOVERSION (CATH LAB)
Anesthesia: General

## 2024-01-18 MED ORDER — SODIUM CHLORIDE 0.9% FLUSH: 3.0000 mL | INTRAVENOUS | Status: DC | PRN

## 2024-01-18 MED ORDER — LIDOCAINE 2% (20 MG/ML) 5 ML SYRINGE
INTRAMUSCULAR | Status: DC | PRN
  Administered 2024-01-18: 80 mg via INTRAVENOUS

## 2024-01-18 MED ORDER — SODIUM CHLORIDE 0.9% FLUSH
3.0000 mL | Freq: Two times a day (BID) | INTRAVENOUS | Status: DC
  Administered 2024-01-18: 3 mL via INTRAVENOUS

## 2024-01-18 MED ORDER — PROPOFOL 10 MG/ML IV BOLUS
INTRAVENOUS | Status: DC | PRN
Start: 2024-01-18 — End: 2024-01-18
  Administered 2024-01-18: 80 mg via INTRAVENOUS

## 2024-01-18 MED ORDER — MAG GLYCINATE 100 MG PO TABS: 240.0000 mg | ORAL_TABLET | Freq: Two times a day (BID) | ORAL | Status: AC

## 2024-01-18 MED ORDER — SODIUM CHLORIDE 0.9 % IV SOLN: 250.0000 mL | INTRAVENOUS | Status: DC | PRN

## 2024-01-18 SURGICAL SUPPLY — 1 items: PAD DEFIB RADIO PHYSIO CONN (PAD) ×1 IMPLANT

## 2024-01-18 NOTE — CV Procedure (Signed)
    Electrical Cardioversion Procedure Note Brandy [name] [id number] 1955-12-05  Procedure: Electrical Cardioversion Indications:  Atrial Fibrillation  Time Out: Verified patient identification, verified procedure,medications/allergies/relevent history reviewed, required imaging and test results available.  Performed  Procedure Details  During this procedure the patient is administered a total of Propofol  80 mg and Lidocaine  80 mg to achieve and maintain moderate conscious sedation.  The patient's heart rate, blood pressure, and oxygen saturation are monitored continuously during the procedure. The period of conscious sedation is 2 minutes, of which I was present face-to-face 100% of this time. Brandy Hancock, CRNA is an independent, trained observer who assisted in the monitoring of the patient's level of consciousness.     Cardioversion was done with synchronized biphasic defibrillation with AP pads with 200watts.  The patient converted to normal sinus rhythm. The patient tolerated the procedure well   IMPRESSION:  Successful cardioversion of atrial fibrillation    [place] Severs 01/18/2024, 10:16 AM

## 2024-01-18 NOTE — Anesthesia Preprocedure Evaluation (Signed)
 Anesthesia Evaluation  Patient identified by MRN, date of birth, ID band Patient awake    Reviewed: Allergy & Precautions, NPO status , Patient's Chart, lab work & pertinent test results  History of Anesthesia Complications Negative for: history of anesthetic complications  Airway Mallampati: II  TM Distance: >3 FB Neck ROM: Full    Dental no notable dental hx. (+) Teeth Intact   Pulmonary sleep apnea and Continuous Positive Airway Pressure Ventilation , neg COPD, Patient abstained from smoking.Not current smoker   Pulmonary exam normal breath sounds clear to auscultation       Cardiovascular Exercise Tolerance: Good METShypertension, (-) CAD, (-) Past MI and (-) CHF + dysrhythmias Atrial Fibrillation  Rhythm:Regular Rate:Normal - Systolic murmurs 1. Left ventricular ejection fraction, by estimation, is 55 to 60%. The  left ventricle has normal function. The left ventricle has no regional  wall motion abnormalities. Left ventricular diastolic parameters were  normal. The average left ventricular  global longitudinal strain is -15.0 %.   2. Right ventricular systolic function is normal. The right ventricular  size is normal. There is normal pulmonary artery systolic pressure. The  estimated right ventricular systolic pressure is 26.5 mmHg.   3. Left atrial size was borderline mildly dilated.   4. The mitral valve is normal in structure. Mild mitral valve  regurgitation. No evidence of mitral stenosis.   5. The aortic valve is tricuspid. Aortic valve regurgitation is not  visualized. No aortic stenosis is present.   6. The inferior vena cava is normal in size with greater than 50%  respiratory variability, suggesting right atrial pressure of 3 mmHg.     Neuro/Psych negative neurological ROS  negative psych ROS   GI/Hepatic ,GERD  Controlled,,(+)     (-) substance abuse    Endo/Other  diabetes    Renal/GU negative Renal  ROS     Musculoskeletal   Abdominal   Peds  Hematology   Anesthesia Other Findings Past Medical History: No date: Arthritis No date: Chicken pox No date: Chronic systolic CHF (congestive heart failure) (HCC)     Comment:  a. 03/2016 Echo: Ef 15-20%, diff HK, ant AK;  b. 05/2016               Echo: Ef 30-35%, diff HK, mildly dil LA/RA. No date: Diabetes mellitus without complication (HCC) No date: Dysrhythmia No date: GERD (gastroesophageal reflux disease) No date: Heart murmur No date: Hip discomfort     Comment:  been going on for 40 years  No date: History of kidney stones No date: Hypertension No date: Moderate mitral regurgitation     Comment:  a. 03/2016 Echo: mod MR in setting of LV dysfxn. No date: Motion sickness     Comment:  car - back seat No date: NICM (nonischemic cardiomyopathy) (HCC)     Comment:  a. 03/2016 Echo: EF 15-20%, diff HK, ant AK, mod MR, mod              dil LA, mildly dil RA;  b. 04/2016 Cath: nl cors;  c.               05/2016 Echo: EF 30-35%, diff HK. No date: Obesity No date: Obstructive sleep apnea     Comment:  compliant with CPAP No date: Persistent atrial fibrillation (HCC)     Comment:  a. Dx 03/2016;  b. CHA2DS2VASc = 2-->Eliquis  5mg  BID;  b. 05/2016 Failed DCCV x 4. No date: Sleep apnea 07/24/2016: Visit for monitoring Tikosyn  therapy  Reproductive/Obstetrics                              Anesthesia Physical Anesthesia Plan  ASA: 3  Anesthesia Plan: General   Post-op Pain Management: Minimal or no pain anticipated   Induction: Intravenous  PONV Risk Score and Plan: 3 and Ondansetron , Propofol  infusion and TIVA  Airway Management Planned: Nasal Cannula  Additional Equipment: None  Intra-op Plan:   Post-operative Plan:   Informed Consent: I have reviewed the patients History and Physical, chart, labs and discussed the procedure including the risks, benefits and alternatives for the  proposed anesthesia with the patient or authorized representative who has indicated his/her understanding and acceptance.     Dental advisory given  Plan Discussed with: CRNA and Surgeon  Anesthesia Plan Comments: (Discussed risks of anesthesia with patient, including possibility of difficulty with spontaneous ventilation under anesthesia necessitating airway intervention, PONV, and rare risks such as cardiac or respiratory or neurological events. Patient understands.)         Anesthesia Quick Evaluation

## 2024-01-18 NOTE — Anesthesia Postprocedure Evaluation (Signed)
 Anesthesia Post Note  Patient: Brandy [name]  Procedure(s) Performed: CARDIOVERSION     Patient location during evaluation: Cath Lab Anesthesia Type: General Level of consciousness: awake and alert Pain management: pain level controlled Vital Signs Assessment: post-procedure vital signs reviewed and stable Respiratory status: spontaneous breathing, nonlabored ventilation, respiratory function stable and patient connected to nasal cannula oxygen Cardiovascular status: blood pressure returned to baseline and stable Postop Assessment: no apparent nausea or vomiting Anesthetic complications: no   No notable events documented.  Last Vitals:  Vitals:   01/18/24 1109 01/18/24 1119  BP: 115/65 117/81  Pulse: 65 71  Resp: 16 18  Temp:    SpO2: 97% 96%    Last Pain:  Vitals:   01/18/24 1119  TempSrc:   PainSc: 0-No pain                 Rome Ade

## 2024-01-18 NOTE — Transfer of Care (Signed)
 Immediate Anesthesia Transfer of Care Note  Patient: Brandy [name]  Procedure(s) Performed: CARDIOVERSION  Patient Location: Cath Lab  Anesthesia Type:General  Level of Consciousness: awake, alert , oriented, and patient cooperative  Airway & Oxygen Therapy: Patient Spontanous Breathing and Patient connected to nasal cannula oxygen  Post-op Assessment: Report given to RN and Post -op Vital signs reviewed and stable  Post vital signs: Reviewed and stable  Last Vitals:  Vitals Value Taken Time  BP 120/60 10:59  Temp    Pulse 71   Resp 16   SpO2 97     Last Pain:  Vitals:   01/18/24 1028  TempSrc:   PainSc: 0-No pain         Complications: No notable events documented.

## 2024-01-18 NOTE — Research (Signed)
 [facility] Cardioversion Informed Consent   Subject Name: Brandy [name]  Subject met inclusion and exclusion criteria.  The informed consent form, study requirements and expectations were reviewed with the subject and questions and concerns were addressed prior to the signing of the consent form.  The subject verbalized understanding of the trial requirements.  The subject agreed to participate in the [facility] Cardioversion trial and signed the informed consent at 1003 on 01/18/2024.  The informed consent was obtained prior to performance of any protocol-specific procedures for the subject.  A copy of the signed informed consent was given to the subject and a copy was placed in the subject's medical record.   [name]

## 2024-01-18 NOTE — Interval H&P Note (Signed)
 History and Physical Interval Note:  01/18/2024 10:16 AM  Brandy [name]  has presented today for surgery, with the diagnosis of AFIB.  The various methods of treatment have been discussed with the patient and family. After consideration of risks, benefits and other options for treatment, the patient has consented to  Procedure(s): CARDIOVERSION (N/A) as a surgical intervention.  The patient's history has been reviewed, patient examined, no change in status, stable for surgery.  I have reviewed the patient's chart and labs.  Questions were answered to the patient's satisfaction.     Wilbert Bihari

## 2024-01-18 NOTE — Discharge Instructions (Signed)

## 2024-01-20 ENCOUNTER — Encounter (HOSPITAL_COMMUNITY): Payer: Self-pay | Admitting: Cardiology

## 2024-01-21 ENCOUNTER — Ambulatory Visit

## 2024-01-21 DIAGNOSIS — I428 Other cardiomyopathies: Secondary | ICD-10-CM | POA: Diagnosis not present

## 2024-01-21 LAB — CUP PACEART REMOTE DEVICE CHECK
Date Time Interrogation Session: 20250906231940
Implantable Pulse Generator Implant Date: 20230928

## 2024-01-23 ENCOUNTER — Ambulatory Visit: Attending: Family Medicine

## 2024-01-23 DIAGNOSIS — G8929 Other chronic pain: Secondary | ICD-10-CM | POA: Insufficient documentation

## 2024-01-23 DIAGNOSIS — M25562 Pain in left knee: Secondary | ICD-10-CM | POA: Diagnosis not present

## 2024-01-23 DIAGNOSIS — M25551 Pain in right hip: Secondary | ICD-10-CM | POA: Insufficient documentation

## 2024-01-23 DIAGNOSIS — M6281 Muscle weakness (generalized): Secondary | ICD-10-CM | POA: Diagnosis not present

## 2024-01-23 NOTE — Therapy (Unsigned)
 OUTPATIENT PHYSICAL THERAPY ANKLE/KNEE/HIP TREATMENT  Patient Name: Brandy [name] MRN: [id number] DOB:Jun 25, 1955, 68 y.o., female Today's Date: 01/25/2024  END OF SESSION:  PT End of Session - 01/25/24 1702     Visit Number 24    Number of Visits 61    Date for PT Re-Evaluation 03/04/24    Authorization Type eval: 06/08/23    PT Start Time 1402    PT Stop Time 1445    PT Time Calculation (min) 43 min    Activity Tolerance Patient tolerated treatment well    Behavior During Therapy [facility] for tasks assessed/performed         Past Medical History:  Diagnosis Date   Acute lower GI bleeding 01/19/2020   Arthritis    Atrial fibrillation with rapid ventricular response (HCC)    Chicken pox    Chronic systolic CHF (congestive heart failure) (HCC)    a. 03/2016 Echo: Ef 15-20%, diff HK, ant AK;  b. 05/2016 Echo: Ef 30-35%, diff HK, mildly dil LA/RA.   Diabetes mellitus without complication (HCC)    Dysrhythmia    GERD (gastroesophageal reflux disease)    Heart murmur    Hip discomfort    been going on for 40 years    History of kidney stones    Hyperlipidemia associated with type 2 diabetes mellitus (HCC) 05/12/2021   Hypertension    Moderate mitral regurgitation    a. 03/2016 Echo: mod MR in setting of LV dysfxn.   Motion sickness    car - back seat   NICM (nonischemic cardiomyopathy) (HCC)    a. 03/2016 Echo: EF 15-20%, diff HK, ant AK, mod MR, mod dil LA, mildly dil RA;  b. 04/2016 Cath: nl cors;  c. 05/2016 Echo: EF 30-35%, diff HK.   Obesity    Obstructive sleep apnea    compliant with CPAP   Persistent atrial fibrillation (HCC)    a. Dx 03/2016;  b. CHA2DS2VASc = 2-->Eliquis  5mg  BID;  b. 05/2016 Failed DCCV x 4.   Sleep apnea    Stomach irritation    Visit for monitoring Tikosyn  therapy 07/24/2016   Past Surgical History:  Procedure Laterality Date   ATRIAL FIBRILLATION ABLATION N/A 12/09/2020   Procedure: ATRIAL FIBRILLATION ABLATION;  Surgeon: Cindie Ole ONEIDA, MD;   Location: [facility] INVASIVE CV LAB;  Service: Cardiovascular;  Laterality: N/A;   BIOPSY N/A 01/12/2020   Procedure: BIOPSY;  Surgeon: Janalyn Keene NOVAK, MD;  Location: [facility] SURGERY [facility];  Service: Endoscopy;  Laterality: N/A;   CARDIAC CATHETERIZATION N/A 04/17/2016   Procedure: Left Heart Cath and Coronary Angiography;  Surgeon: Deatrice DELENA Cage, MD;  Location: [facility] INVASIVE CV LAB;  Service: Cardiovascular;  Laterality: N/A;   CARDIOVERSION N/A 10/22/2020   Procedure: CARDIOVERSION;  Surgeon: Mady Bruckner, MD;  Location: [facility] ORS;  Service: Cardiovascular;  Laterality: N/A;   CARDIOVERSION N/A 01/18/2024   Procedure: CARDIOVERSION;  Surgeon: Shlomo Wilbert SAUNDERS, MD;  Location: [facility] INVASIVE CV LAB;  Service: Cardiovascular;  Laterality: N/A;   CHOLECYSTECTOMY     COLON SURGERY  2021   COLONOSCOPY N/A 01/20/2020   Procedure: COLONOSCOPY;  Surgeon: Maryruth Ole ONEIDA, MD;  Location: [facility] ENDOSCOPY;  Service: Endoscopy;  Laterality: N/A;   COLONOSCOPY WITH PROPOFOL  N/A 01/12/2020   Procedure: COLONOSCOPY WITH PROPOFOL ;  Surgeon: Janalyn Keene NOVAK, MD;  Location: [facility] SURGERY [facility];  Service: Endoscopy;  Laterality: N/A;  Diabetic - oral meds sleep apnea   ELECTROPHYSIOLOGIC STUDY N/A 05/26/2016   Procedure: Cardioversion;  Surgeon: Deatrice  DELENA Cage, MD;  Location: [facility] ORS;  Service: Cardiovascular;  Laterality: N/A;   ESOPHAGOGASTRODUODENOSCOPY (EGD) WITH PROPOFOL  N/A 01/12/2020   Procedure: ESOPHAGOGASTRODUODENOSCOPY (EGD) WITH PROPOFOL ;  Surgeon: Janalyn Keene NOVAK, MD;  Location: [facility] SUR[facility];  Service: Endoscopy;  Laterality: N/A;   POLYPECTOMY N/A 01/12/2020   Procedure: POLYPECTOMY;  Surgeon: Janalyn Keene NOVAK, MD;  Location: [facility] SUR[facility];  Service: Endoscopy;  Laterality: N/A;   Patient Active Problem List   Diagnosis Date Noted   Mucous cyst of digit of left hand 05/18/2023   Peroneal tendinitis, left 10/13/2022   Left lumbar radiculopathy 07/26/2022   Arthralgia of  left knee 07/26/2022   Pes anserinus bursitis of right knee 04/19/2022   Osteoarthritis of both knees 03/27/2022   Greater trochanteric pain syndrome of both lower extremities 03/27/2022   It band syndrome, right 03/27/2022   Hyperlipidemia associated with type 2 diabetes mellitus (HCC) 05/12/2021   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   Varicose veins of leg with pain, bilateral 07/06/2020   Type II diabetes mellitus with complication (HCC) 01/19/2020   Gastric polyp    Colon polyps    Varicose veins of both lower extremities 10/20/2019   Dysphagia 10/20/2019   Osteoarthritis of right hand 10/31/2016   Nonischemic cardiomyopathy (HCC) 04/14/2016   BMI 33.0-33.9,adult 04/14/2016   Persistent atrial fibrillation (HCC) 04/12/2016    PCP: Leita Adie, MD  REFERRING PROVIDER: Selinda Ku, MD  REFERRING DIAG:  [phone] (ICD-10-CM) - Peroneal tendinitis, left  M54.16 (ICD-10-CM) - Left lumbar radiculopathy  M25.562 (ICD-10-CM) - Arthralgia of left knee  M17.11 (ICD-10-CM) - Primary osteoarthritis of right knee  M76.31 (ICD-10-CM) - It band syndrome, right  M25.551 (ICD-10-CM) - Greater 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   Rationale for Evaluation and Treatment: Rehabilitation  THERAPY DIAG: Muscle weakness (generalized)  Pain in right hip  Chronic pain of left knee  ONSET DATE: Chronic, aggravated over Christmas (December 2024)  FOLLOW-UP APPT SCHEDULED WITH REFERRING PROVIDER: No, initially no but now has appt for July;  INITIAL EVALUATION: SUBJECTIVE:                                                                                                                                                                                         SUBJECTIVE STATEMENT:  Left ankle and foot pain  PERTINENT HISTORY: Pt reports pain in left ankle that seems to have flared up over Christmas after pt reported excess walking. Pt reports feeling pulling and  burning at the base of the lateral ankle. She has previously been to PT for pain in her left hip and knee but has now noticed increased  pain in the left ankle/foot. Pt expresses difficulty with exercises; specifically pressure on the toes, stretching calves, and rocking on toes. Pt notes pain is as high as 5/10 NPS but is constant and never really goes away, with lowest pain at 1/10 NPS.  PAIN:    Pain Intensity: Present: 1/10, Best: 1/10, Worst: 5/10 Pain location: Lateral foot and ankle Pain Quality: constant and burning  Radiating: Yes  Swelling: No  Popping, catching, locking: No  Numbness/Tingling: No Focal Weakness: Yes Aggravating factors: Touching lateral ankle, moving the ankle, ascending steps when leading with left foot, walking Relieving factors: Stopping activity, resting, brace temporarily relieves some pain 24-hour pain behavior: No night pain when sleeping on back, stretching in AM helps but feels unstable, PM pain depends on activity during the day History of prior back, hip, knee, or ankle injury, pain, surgery, or therapy: Yes; chronic hip, knee, foot pain Dominant hand: right Imaging: No  Typical footwear: Sneakers or rubber boots Red flags: Negative for personal history of cancer, chills/fever, night sweats, nausea, vomiting, unrelenting pain): Negative  PRECAUTIONS: None  WEIGHT BEARING RESTRICTIONS: No  FALLS: Has patient fallen in last 6 months? 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  OBJECTIVE:   Patient Surveys  08/14/23: FADI: FADI Activities Score: 80 / 104 or 77%  Cognition Patient is oriented to person, place, and time.  Recent memory is intact.  Remote memory is intact.  Attention span and concentration are intact.  Expressive speech is intact.  Patient's fund of knowledge is within normal limits for educational level.    Gross Musculoskeletal Assessment Bulk: Normal Tone: Normal No trophic changes noted to foot/ankle. No ecchymosis, erythema, or edema noted. No gross ankle/foot deformity noted  GAIT: Comments: Mild toe out gait on right foot. Antalgic gait noted on left side.  Posture: No noted discrepancies.  AROM AROM (Normal range in degrees) AROM       Hip Right Left  Flexion (125) WNL WNL  Extension (15) WNL WNL  Abduction (40) WNL WNL  Adduction  WNL WNL  Internal Rotation (45) WNL WNL  External Rotation (45) WNL WNL      Knee    Flexion (135) WNL WNL  Extension (0) WNL WNL      Ankle    Dorsiflexion (20) 10 (WB) 10 (WB)  Plantarflexion (50) 40 35  Inversion (35) 30 20  Eversion (15) 7 8  (* = pain; Blank rows = not tested)  LE MMT: MMT (out of 5) Right  Left   Hip flexion 4 4  Hip extension 4 4  Hip abduction 4 4+  Hip adduction 4+ 4  Hip internal rotation 5 5  Hip external rotation 5 5  Knee flexion 4 4  Knee extension 5 5  Ankle dorsiflexion 5 5  Ankle plantarflexion    Ankle inversion 5 5*  Ankle eversion 5 5*  (* = pain; Blank rows = not tested)  Sensation Diminished sensation left S1 and lateral  aspect of left foot.  Reflexes Deferred  Palpation Location LEFT  RIGHT           Plantar fascia 1 0  Dorsal aspect of foot 1 0  Tarsal tunnel 1 0  (Blank rows = not tested) Graded on 0-4 scale (0 = no pain, 1 = pain, 2 = pain with wincing/grimacing/flinching, 3 = pain with withdrawal, 4 = unwilling to allow  palpation), (Blank rows = not tested)  Passive Accessory Motion Superior Tibiofibular Joint: WNL Inferior Tibiofibular Joint: WNL Talocrural Joint Distraction: WNL but noted pain with pulling feeling with left foot Talocrural Joint AP: L hypomobile, painful where hand is purchased Talocrural Joint PA: L hypomobile  SPECIAL TESTS Ligamentous Integrity Anterior Drawer (ATF, 10-15 plantarflexion with anterior translation): Negative Talar Tilt (CFL, inversion): Negative    TODAY'S TREATMENT: 01/23/2024   Subjective: Pt reports that she is doing alright today. She underwent a cardioversion since the last therapy session without complication. No additional bouts of afib that she is aware of since the cardioversion. No specific questions or concerns currently.   PAIN: 1/10 R hip pain, 3-4/10 L knee pain;   Ther-ex  Pre-vitals: BP: 129/55, HR: 65, SpO2%: 97%, radial pulse radial NuStep L1-4 x 10 minutes for warm-up and BLE strengthening during interval history (5 minutes unbilled); Hooklying SLR with 3# AW 2 x 10 BLE; Hooklying bridge 2 x 10; Hooklying clams with manual resistance 2 x 15; Hooklying adductor ball squeeze 2 x 15; Supine hip abduction with manual resistance 2 x 15 BLE; SAQ over bolster with manual resistance 2 x 15  BLE;   Not today:  Ankle pumps 2 x 30s BLE; Seated ankle circumduction 2 x 30s each direction BLE; Seated resisted PF with green tband x 20 BLE; Seated resisted inversion with green tband x 20 BLE; Seated resisted eversion with green tband x 20 BLE; Supine isometric DF 3 x 20s BLE; Supine isometric PF 3 x 20s BLE; Ankle DF stretch 3 x 30s BLE; Prone L knee hamstring curls with manual resistance from therapist 2 x 10; Prone LLE straight knee hip extension 2 x 10; Prone L hip ER with knee at 90 flexion and manual resistance from therapist 2 x 10; R sidelying L hip reverse clam with manual resistance 2 x 10; Total Gym L22 double leg partial squats with  green tband around thighs 2 x 10 BLE; Hooklying bridge march with green tband around knees x 10 BLE; Pball bridges with arms in front of chest 2 x 10;   PATIENT EDUCATION:  Education details: Pt educated throughout session about proper posture and technique with exercises. Improved exercise technique, movement at target joints, use of target muscles after min to mod verbal, visual, tactile cues.  Person educated: Patient Education method: Explanation, Actor cues, Verbal cues, and Handouts Education comprehension: verbalized understanding and returned demonstration   HOME EXERCISE PROGRAM:  Access Code: [email or web address] URL: [email or web address] Date: 12/11/2023 Prepared by: Selinda Eck  Exercises - Seated Ankle Eversion with Resistance  - 1 x daily - 7 x weekly - 2 sets - 10 reps - 3s hold - Seated Ankle Inversion with Resistance and Legs Crossed (Mirrored)  - 1 x daily - 7 x weekly - 2 sets - 10 reps - 3s hold - Seated Ankle Plantarflexion with Resistance  - 1 x daily - 7 x weekly - 2 sets - 10 reps - 3s hold - Long Sitting Calf Stretch with Strap  - 1 x daily - 7 x  weekly - 3 reps - 30-45s hold - Standing Gastroc Stretch on Step  - 1 x daily - 7 x weekly - 3 sets - 10 reps - Standing Dorsiflexion Self-Mobilization on Step  - 1 x daily - 7 x weekly - 3 sets - 10 reps - Standing Dorsiflexion Self-Mobilization on Step (Mirrored)  - 1 x daily - 7 x weekly - 3 sets - 10 reps - Hooklying Isometric Hip Abduction with Belt  - 1 x daily - 7 x weekly - 2 sets - 5 reps - 30s hold/30s relax hold   ASSESSMENT:  CLINICAL IMPRESSION: Continued mat table strengthening for bilateral hip and LE musculature during session today in order to not irritate hip pain. She is able to tolerate resisted SAQ today without significant increase in pain. She does however report some mild increase in pain with resisted hip abduction. No HEP updates at this time. Pt encouraged to follow-up as scheduled.  She will benefit from PT services to address deficits in strength and pain in order to improve pain-free function at home and with leisure activities.   OBJECTIVE IMPAIRMENTS: decreased strength and hypomobility.   ACTIVITY LIMITATIONS: stairs and locomotion level  PARTICIPATION LIMITATIONS: occupation and yard work  PERSONAL FACTORS: Age, Behavior pattern, Past/current experiences, Time since onset of injury/illness/exacerbation, and 1-2 comorbidities: type II diabetes and left lumbar radiculopathy are also affecting patient's functional outcome.   REHAB POTENTIAL: Good  CLINICAL DECISION MAKING: Evolving/moderate complexity  EVALUATION COMPLEXITY: Moderate   GOALS: Goals reviewed with patient? Yes  SHORT TERM GOALS:   Pt will be independent with HEP to improve strength and decrease ankle pain to improve pain-free function at home and work. Baseline:  Goal status: ACHIEVED   LONG TERM GOALS: Target date: 03/04/2024   1.  Pt will decrease worst ankle pain by at least 3 points on the NPRS in order to demonstrate clinically significant reduction in ankle pain. Baseline: 06/08/23: 1/10 best, 5/10 worst; 08/14/23: worst: 1/10; Goal status: ACHIEVED  2.  Pt will increase FADI score by at least 4 points in order demonstrate clinically significant reduction in ankle pain/disability.       Baseline: 06/13/23: 65%; 08/14/23: 80 / 104 or 77% Goal status: ACHIEVED  3.  Pt will improve strength of ankle inversion/eversion with limited to no pain in order to demonstrate improvement in strength and function.  Baseline: 06/08/23: 5/5 with pain; 08/14/23: 5/5 without pain Goal status: ACHIEVED  4.  Pt will report at least 50% improvement in L ankle and knee symptoms in order to demonstrate improvement in her ability to work in the yard/garden with less pain.  Baseline: 08/14/23: 75% improvement Goal status: ACHIEVED  5.  Pt will decrease HAGOS score by at least 20 points in order demonstrate  clinically significant reduction in hip pain/disability.  Baseline: 08/14/23: HAGOS Score: 37.7%, 12/11/23: 44.3%  Goal status: PARTIALLY MET  6.  Pt will decrease worst posterior R hip pain by at least 3 points on the NPRS in order to demonstrate clinically significant reduction in hip pain. Baseline: 08/14/23: worst: 8/10; 12/11/23: 2/10; Goal status: ACHIEVED   PLAN: PT FREQUENCY: 1x/week  PT DURATION: 12 weeks  PLANNED INTERVENTIONS: Therapeutic exercises, Therapeutic activity, Neuromuscular re-education, Balance training, Gait training, Patient/Family education, Self Care, Joint mobilization, Joint manipulation, Vestibular training, Canalith repositioning, Orthotic/Fit training, DME instructions, Dry Needling, Electrical stimulation, Spinal manipulation, Spinal mobilization, Cryotherapy, Moist heat, Taping, Traction, Ultrasound, Ionotophoresis 4mg /ml Dexamethasone , Manual therapy, and Re-evaluation.  PLAN FOR NEXT SESSION:  strengthening and mobility exercises focused on R hip pain, review and modify HEP as necessary  Kohen Reither D Demont Linford PT, DPT, GCS  5:10 PM,01/25/24

## 2024-01-24 ENCOUNTER — Telehealth: Payer: Self-pay | Admitting: Internal Medicine

## 2024-01-24 NOTE — Telephone Encounter (Signed)
 Called pt told her she does not need to fast for her appt.  KP

## 2024-01-24 NOTE — Telephone Encounter (Signed)
 Copied from CRM [id number]. Topic: Appointments - Scheduling Inquiry for Clinic >> Jan 24, 2024  9:36 AM Donna BRAVO wrote: Reason for CRM: patient is asking if she needs to be fasting for the appt on 01/25/24

## 2024-01-25 ENCOUNTER — Ambulatory Visit (INDEPENDENT_AMBULATORY_CARE_PROVIDER_SITE_OTHER): Admitting: Internal Medicine

## 2024-01-25 ENCOUNTER — Encounter: Payer: Self-pay | Admitting: Internal Medicine

## 2024-01-25 VITALS — BP 116/68 | HR 65 | Ht 67.0 in | Wt 236.0 lb

## 2024-01-25 DIAGNOSIS — Z7984 Long term (current) use of oral hypoglycemic drugs: Secondary | ICD-10-CM

## 2024-01-25 DIAGNOSIS — E118 Type 2 diabetes mellitus with unspecified complications: Secondary | ICD-10-CM

## 2024-01-25 DIAGNOSIS — I4819 Other persistent atrial fibrillation: Secondary | ICD-10-CM | POA: Diagnosis not present

## 2024-01-25 DIAGNOSIS — Z1231 Encounter for screening mammogram for malignant neoplasm of breast: Secondary | ICD-10-CM | POA: Diagnosis not present

## 2024-01-25 DIAGNOSIS — I428 Other cardiomyopathies: Secondary | ICD-10-CM

## 2024-01-25 LAB — POCT GLYCOSYLATED HEMOGLOBIN (HGB A1C): Hemoglobin A1C: 6 % — AB (ref 4.0–5.6)

## 2024-01-25 MED ORDER — METOPROLOL TARTRATE 50 MG PO TABS: 50.0000 mg | ORAL_TABLET | Freq: Two times a day (BID) | ORAL | 1 refills | Status: DC

## 2024-01-25 NOTE — Assessment & Plan Note (Addendum)
 Blood sugars have been stable.  No hypoglycemic events since last visit. Currently medications are Farxiga  and MTF 500 mg. Last visit medical regimen changes were to increase MTF to 500 mg daily. Lab Results  Component Value Date   HGBA1C 6.1 (A) 09/14/2023  A1C today =  6.0 Will continue same medications. If cost of Farxiga  is too much, can refer to pharmacy for help

## 2024-01-25 NOTE — Assessment & Plan Note (Signed)
 Recently underwent successful cardioversion.

## 2024-01-25 NOTE — Assessment & Plan Note (Signed)
 Doing well on metoprolol . Afib cardioversion done last week.

## 2024-01-25 NOTE — Progress Notes (Signed)
 Date:  01/25/2024   Name:  Brandy [name]   DOB:  March 24, 1956   MRN:  [id number]   Chief Complaint: No chief complaint on file.  Diabetes She presents for her follow-up diabetic visit. She has type 2 diabetes mellitus. Her disease course has been stable. Pertinent negatives for hypoglycemia include no dizziness or headaches. Pertinent negatives for diabetes include no chest pain, no fatigue and no weakness. Current diabetic treatments: Farxiga  and MTF.  Afib - recently started Eliquis  and had cardioversion last week.  Review of Systems  Constitutional:  Negative for fatigue and unexpected weight change.  HENT:  Negative for trouble swallowing.   Eyes:  Negative for visual disturbance.  Respiratory:  Negative for cough, chest tightness, shortness of breath and wheezing.   Cardiovascular:  Negative for chest pain, palpitations and leg swelling.  Gastrointestinal:  Negative for abdominal pain, constipation and diarrhea.  Musculoskeletal:  Negative for arthralgias and myalgias.  Neurological:  Negative for dizziness, weakness, light-headedness and headaches.     Lab Results  Component Value Date   NA 139 12/19/2023   K 5.1 12/19/2023   CO2 22 12/19/2023   GLUCOSE 86 12/19/2023   BUN 21 12/19/2023   CREATININE 0.93 12/19/2023   CALCIUM  10.0 12/19/2023   EGFR 67 12/19/2023   GFRNONAA >60 02/12/2023   Lab Results  Component Value Date   CHOL 191 05/18/2023   HDL 43 05/18/2023   LDLCALC 120 (H) 05/18/2023   LDLDIRECT 131.0 10/31/2016   TRIG 157 (H) 05/18/2023   CHOLHDL 4.4 05/18/2023   Lab Results  Component Value Date   TSH 2.260 05/18/2023   Lab Results  Component Value Date   HGBA1C 6.0 (A) 01/25/2024   Lab Results  Component Value Date   WBC 13.8 (H) 12/19/2023   HGB 16.9 (H) 12/19/2023   HCT 50.7 (H) 12/19/2023   MCV 93 12/19/2023   PLT 413 12/19/2023   Lab Results  Component Value Date   ALT 22 05/18/2023   AST 17 05/18/2023   ALKPHOS 87 05/18/2023    BILITOT 0.4 05/18/2023   Lab Results  Component Value Date   VD25OH 38.71 10/31/2016     Patient Active Problem List   Diagnosis Date Noted   Mucous cyst of digit of left hand 05/18/2023   Peroneal tendinitis, left 10/13/2022   Left lumbar radiculopathy 07/26/2022   Arthralgia of left knee 07/26/2022   Pes anserinus bursitis of right knee 04/19/2022   Osteoarthritis of both knees 03/27/2022   Greater trochanteric pain syndrome of both lower extremities 03/27/2022   It band syndrome, right 03/27/2022   Hyperlipidemia associated with type 2 diabetes mellitus (HCC) 05/12/2021   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   Varicose veins of leg with pain, bilateral 07/06/2020   Type II diabetes mellitus with complication (HCC) 01/19/2020   Gastric polyp    Colon polyps    Varicose veins of both lower extremities 10/20/2019   Dysphagia 10/20/2019   Osteoarthritis of right hand 10/31/2016   Nonischemic cardiomyopathy (HCC) 04/14/2016   BMI 33.0-33.9,adult 04/14/2016   Persistent atrial fibrillation (HCC) 04/12/2016    No Known Allergies  Past Surgical History:  Procedure Laterality Date   ATRIAL FIBRILLATION ABLATION N/A 12/09/2020   Procedure: ATRIAL FIBRILLATION ABLATION;  Surgeon: Cindie Ole ONEIDA, MD;  Location: [facility];  Service: Cardiovascular;  Laterality: N/A;   BIOPSY N/A 01/12/2020   Procedure: BIOPSY;  Surgeon: Janalyn Keene NOVAK, MD;  Location: [facility];  Service: Endoscopy;  Laterality: N/A;   CARDIAC CATHETERIZATION N/A 04/17/2016   Procedure: Left Heart Cath and Coronary Angiography;  Surgeon: Deatrice DELENA Cage, MD;  Location: [facility];  Service: Cardiovascular;  Laterality: N/A;   CARDIOVERSION N/A 10/22/2020   Procedure: CARDIOVERSION;  Surgeon: Mady Bruckner, MD;  Location: [facility];  Service: Cardiovascular;  Laterality: N/A;   CARDIOVERSION N/A 01/18/2024   Procedure: CARDIOVERSION;  Surgeon: Shlomo Wilbert SAUNDERS, MD;   Location: [facility];  Service: Cardiovascular;  Laterality: N/A;   CHOLECYSTECTOMY     COLON SURGERY  2021   COLONOSCOPY N/A 01/20/2020   Procedure: COLONOSCOPY;  Surgeon: Maryruth Ole DASEN, MD;  Location: [facility];  Service: Endoscopy;  Laterality: N/A;   COLONOSCOPY WITH PROPOFOL  N/A 01/12/2020   Procedure: COLONOSCOPY WITH PROPOFOL ;  Surgeon: Janalyn Keene NOVAK, MD;  Location: [facility];  Service: Endoscopy;  Laterality: N/A;  Diabetic - oral meds sleep apnea   ELECTROPHYSIOLOGIC STUDY N/A 05/26/2016   Procedure: Cardioversion;  Surgeon: Deatrice DELENA Cage, MD;  Location: [facility];  Service: Cardiovascular;  Laterality: N/A;   ESOPHAGOGASTRODUODENOSCOPY (EGD) WITH PROPOFOL  N/A 01/12/2020   Procedure: ESOPHAGOGASTRODUODENOSCOPY (EGD) WITH PROPOFOL ;  Surgeon: Janalyn Keene NOVAK, MD;  Location: [facility];  Service: Endoscopy;  Laterality: N/A;   POLYPECTOMY N/A 01/12/2020   Procedure: POLYPECTOMY;  Surgeon: Janalyn Keene NOVAK, MD;  Location: [facility];  Service: Endoscopy;  Laterality: N/A;    Social History   Tobacco Use   Smoking status: Never    Passive exposure: Never   Smokeless tobacco: Never  Vaping Use   Vaping status: Never Used  Substance Use Topics   Alcohol use: Never   Drug use: Never     Medication list has been reviewed and updated.  Current Meds  Medication Sig   apixaban  (ELIQUIS ) 5 MG TABS tablet Take 1 tablet (5 mg total) by mouth 2 (two) times daily.   Cholecalciferol  (VITAMIN D3) 1000 units CAPS Take 1,000 Units by mouth 2 (two) times daily.   dapagliflozin  propanediol (FARXIGA ) 10 MG TABS tablet TAKE 1 TABLET DAILY BEFORE BREAKFAST   furosemide  (LASIX ) 20 MG tablet Take 1 tablet (20 mg total) by mouth daily as needed for fluid or edema.   glucose blood (ONETOUCH VERIO) test strip USE 1 STRIP TO CHECK GLUCOSE 4 TIMES DAILY AS  DIRECTED   Lancets (ONETOUCH DELICA PLUS LANCET33G) MISC USE 1  TO CHECK GLUCOSE 4  TIMES DAILY AS DIRECTED   Magnesium Bisglycinate (MAG GLYCINATE) 100 MG TABS Take 240 mg by mouth 2 (two) times daily.   metFORMIN  (GLUCOPHAGE -XR) 500 MG 24 hr tablet Take 1/2 (one-half) tablet by mouth twice daily (Patient taking differently: Take 500 mg by mouth daily with breakfast.)   Multiple Vitamins-Minerals (MULTIVITAMIN WITH MINERALS) tablet Take 1 tablet by mouth daily.   OVER THE COUNTER MEDICATION Apply 1 Application topically daily as needed. [place]   peppermint oil liquid Apply 1 application topically daily as needed (itchy/ cold symptoms).   vitamin B-12 (CYANOCOBALAMIN) 500 MCG tablet Take 500 mcg by mouth daily.   vitamin C (ASCORBIC ACID) 500 MG tablet Take 500 mg by mouth daily.    [DISCONTINUED] metoprolol  tartrate (LOPRESSOR ) 50 MG tablet Take 1 tablet (50 mg total) by mouth 2 (two) times daily.       01/25/2024    9:35 AM 09/14/2023    9:50 AM 05/18/2023    9:41 AM 12/20/2022    7:58 AM  GAD 7 :  Generalized Anxiety Score  Nervous, Anxious, on Edge 2 1 1 1   Control/stop worrying 1 0 0 1  Worry too much - different things 1 0 0 1  Trouble relaxing 1 0 0 1  Restless 1 0 0 1  Easily annoyed or irritable 0 0 0 1  Afraid - awful might happen 1 0 0 1  Total GAD 7 Score 7 1 1 7   Anxiety Difficulty Somewhat difficult Not difficult at all Not difficult at all Somewhat difficult       01/25/2024    9:34 AM 12/12/2023    2:53 PM 12/07/2023    9:09 AM  Depression screen PHQ 2/9  Decreased Interest 1 1 0  Down, Depressed, Hopeless 1 1 0  PHQ - 2 Score 2 2 0  Altered sleeping 2 1   Tired, decreased energy 2 1   Change in appetite 2 0   Feeling bad or failure about yourself  0 1   Trouble concentrating 1 1   Moving slowly or fidgety/restless 1 0   Suicidal thoughts 0 0   PHQ-9 Score 10 6   Difficult doing work/chores Somewhat difficult Somewhat difficult     BP Readings from Last 3 Encounters:  01/25/24 116/68  01/18/24 117/81  12/19/23 112/64    Physical  Exam Vitals and nursing note reviewed.  Constitutional:      General: She is not in acute distress.    Appearance: Normal appearance. She is well-developed.  HENT:     Head: Normocephalic and atraumatic.  Neck:     Vascular: No carotid bruit.  Cardiovascular:     Rate and Rhythm: Normal rate and regular rhythm.     Heart sounds: No murmur heard. Pulmonary:     Effort: Pulmonary effort is normal. No respiratory distress.     Breath sounds: No wheezing or rhonchi.  Musculoskeletal:     Cervical back: Normal range of motion.     Right lower leg: No edema.     Left lower leg: No edema.  Lymphadenopathy:     Cervical: No cervical adenopathy.  Skin:    General: Skin is warm and dry.     Findings: No rash.  Neurological:     General: No focal deficit present.     Mental Status: She is alert and oriented to person, place, and time.  Psychiatric:        Mood and Affect: Mood normal.        Behavior: Behavior normal.     Wt Readings from Last 3 Encounters:  01/25/24 236 lb (107 kg)  12/19/23 225 lb (102.1 kg)  12/12/23 227 lb (103 kg)    BP 116/68   Pulse 65   Ht 5' 7 (1.702 m)   Wt 236 lb (107 kg)   SpO2 95%   BMI 36.96 kg/m   Assessment and Plan:  Problem List Items Addressed This Visit       Unprioritized   Nonischemic cardiomyopathy (HCC) (Chronic)   Doing well on metoprolol . Afib cardioversion done last week.      Relevant Medications   metoprolol  tartrate (LOPRESSOR ) 50 MG tablet   Type II diabetes mellitus with complication (HCC) - Primary (Chronic)   Blood sugars have been stable.  No hypoglycemic events since last visit. Currently medications are Farxiga  and MTF 500 mg. Last visit medical regimen changes were to increase MTF to 500 mg daily. Lab Results  Component Value Date   HGBA1C 6.1 (A) 09/14/2023  A1C today =  6.0 Will continue same medications. If cost of Farxiga  is too much, can refer to pharmacy for help       Relevant Orders   POCT  glycosylated hemoglobin (Hb A1C) (Completed)   Persistent atrial fibrillation (HCC)   Recently underwent successful cardioversion.       Relevant Medications   metoprolol  tartrate (LOPRESSOR ) 50 MG tablet   Other Visit Diagnoses       Long term current use of oral hypoglycemic drug         Encounter for screening mammogram for breast cancer       to be done at [facility] in December       Return in about 4 months (around 05/26/2024) for [facility] CP[facility]  Dr. Lemon.    Leita HILARIO Adie, MD [facility] [place]

## 2024-01-29 NOTE — Therapy (Signed)
 OUTPATIENT PHYSICAL THERAPY ANKLE/KNEE/HIP TREATMENT  Patient Name: Brandy [name] MRN: [id number] DOB:[birth year], 68 y.o., female Today's Date: 02/01/2024  END OF SESSION:  PT End of Session - 02/01/24 1339     Visit Number 25    Number of Visits 61    Date for Recertification  03/04/24    Authorization Type eval: 06/08/23    PT Start Time 1318    PT Stop Time 1400    PT Time Calculation (min) 42 min    Activity Tolerance Patient tolerated treatment well    Behavior During Therapy [facility] for tasks assessed/performed          Past Medical History:  Diagnosis Date   Acute lower GI bleeding 01/19/2020   Arthritis    Atrial fibrillation with rapid ventricular response (HCC)    Chicken pox    Chronic systolic CHF (congestive heart failure) (HCC)    a. 03/2016 Echo: Ef 15-20%, diff HK, ant AK;  b. 05/2016 Echo: Ef 30-35%, diff HK, mildly dil LA/RA.   Diabetes mellitus without complication (HCC)    Dysrhythmia    GERD (gastroesophageal reflux disease)    Heart murmur    Hip discomfort    been going on for 40 years    History of kidney stones    Hyperlipidemia associated with type 2 diabetes mellitus (HCC) 05/12/2021   Hypertension    Moderate mitral regurgitation    a. 03/2016 Echo: mod MR in setting of LV dysfxn.   Motion sickness    car - back seat   NICM (nonischemic cardiomyopathy) (HCC)    a. 03/2016 Echo: EF 15-20%, diff HK, ant AK, mod MR, mod dil LA, mildly dil RA;  b. 04/2016 Cath: nl cors;  c. 05/2016 Echo: EF 30-35%, diff HK.   Obesity    Obstructive sleep apnea    compliant with CPAP   Persistent atrial fibrillation (HCC)    a. Dx 03/2016;  b. CHA2DS2VASc = 2-->Eliquis  5mg  BID;  b. 05/2016 Failed DCCV x 4.   Sleep apnea    Stomach irritation    Visit for monitoring Tikosyn  therapy 07/24/2016   Past Surgical History:  Procedure Laterality Date   ATRIAL FIBRILLATION ABLATION N/A 12/09/2020   Procedure: ATRIAL FIBRILLATION ABLATION;  Surgeon: Cindie Ole ONEIDA, MD;   Location: [facility];  Service: Cardiovascular;  Laterality: N/A;   BIOPSY N/A 01/12/2020   Procedure: BIOPSY;  Surgeon: Janalyn Keene NOVAK, MD;  Location: [facility];  Service: Endoscopy;  Laterality: N/A;   CARDIAC CATHETERIZATION N/A 04/17/2016   Procedure: Left Heart Cath and Coronary Angiography;  Surgeon: Deatrice DELENA Cage, MD;  Location: [facility] INV[facility]IVE CV LAB;  Service: Cardiovascular;  Laterality: N/A;   CARDIOVERSION N/A 10/22/2020   Procedure: CARDIOVERSION;  Surgeon: Mady Bruckner, MD;  Location: [facility] ORS;  Service: Cardiovascular;  Laterality: N/A;   CARDIOVERSION N/A 01/18/2024   Procedure: CARDIOVERSION;  Surgeon: Shlomo Wilbert SAUNDERS, MD;  Location: [facility];  Service: Cardiovascular;  Laterality: N/A;   CHOLECYSTECTOMY     COLON SURGERY  2021   COLONOSCOPY N/A 01/20/2020   Procedure: COLONOSCOPY;  Surgeon: Maryruth Ole ONEIDA, MD;  Location: [facility] ENDOSCOPY;  Service: Endoscopy;  Laterality: N/A;   COLONOSCOPY WITH PROPOFOL  N/A 01/12/2020   Procedure: COLONOSCOPY WITH PROPOFOL ;  Surgeon: Janalyn Keene NOVAK, MD;  Location: [facility];  Service: Endoscopy;  Laterality: N/A;  Diabetic - oral meds sleep apnea   ELECTROPHYSIOLOGIC STUDY N/A 05/26/2016   Procedure: Cardioversion;  Surgeon:  Deatrice DELENA Cage, MD;  Location: [facility] OR[facility];  Service: Cardiovascular;  Laterality: N/A;   ESOPHAGOGASTRODUODENOSCOPY (EGD) WITH PROPOFOL  N/A 01/12/2020   Procedure: ESOPHAGOGASTRODUODENOSCOPY (EGD) WITH PROPOFOL ;  Surgeon: Janalyn Keene NOVAK, MD;  Location: [facility];  Service: Endoscopy;  Laterality: N/A;   POLYPECTOMY N/A 01/12/2020   Procedure: POLYPECTOMY;  Surgeon: Janalyn Keene NOVAK, MD;  Location: [facility];  Service: Endoscopy;  Laterality: N/A;   Patient Active Problem List   Diagnosis Date Noted   Mucous cyst of digit of left hand 05/18/2023   Peroneal tendinitis, left 10/13/2022   Left lumbar radiculopathy 07/26/2022   Arthralgia of  left knee 07/26/2022   Pes anserinus bursitis of right knee 04/19/2022   Osteoarthritis of both knees 03/27/2022   Greater trochanteric pain syndrome of both lower extremities 03/27/2022   It band syndrome, right 03/27/2022   Hyperlipidemia associated with type 2 diabetes mellitus (HCC) 05/12/2021   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   Varicose veins of leg with pain, bilateral 07/06/2020   Type II diabetes mellitus with complication (HCC) 01/19/2020   Gastric polyp    Colon polyps    Varicose veins of both lower extremities 10/20/2019   Dysphagia 10/20/2019   Osteoarthritis of right hand 10/31/2016   Nonischemic cardiomyopathy (HCC) 04/14/2016   BMI 33.0-33.9,adult 04/14/2016   Persistent atrial fibrillation (HCC) 04/12/2016    PCP: Leita Adie, MD  REFERRING PROVIDER: Selinda Ku, MD  REFERRING DIAG:  [phone] (ICD-10-CM) - Peroneal tendinitis, left  M54.16 (ICD-10-CM) - Left lumbar radiculopathy  M25.562 (ICD-10-CM) - Arthralgia of left knee  M17.11 (ICD-10-CM) - Primary osteoarthritis of right knee  M76.31 (ICD-10-CM) - It band syndrome, right  M25.551 (ICD-10-CM) - Greater 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   Rationale for Evaluation and Treatment: Rehabilitation  THERAPY DIAG: Muscle weakness (generalized)  Pain in right hip  ONSET DATE: Chronic, aggravated over Christmas (December 2024)  FOLLOW-UP APPT SCHEDULED WITH REFERRING PROVIDER: No, initially no but now has appt for July;  INITIAL EVALUATION: SUBJECTIVE:                                                                                                                                                                                         SUBJECTIVE STATEMENT:  Left ankle and foot pain  PERTINENT HISTORY: Pt reports pain in left ankle that seems to have flared up over Christmas after pt reported excess walking. Pt reports feeling pulling and burning at the base of the  lateral ankle. She has previously been to PT for pain in her left hip and knee but has now noticed increased pain in the left ankle/foot.  Pt expresses difficulty with exercises; specifically pressure on the toes, stretching calves, and rocking on toes. Pt notes pain is as high as 5/10 NPS but is constant and never really goes away, with lowest pain at 1/10 NPS.  PAIN:    Pain Intensity: Present: 1/10, Best: 1/10, Worst: 5/10 Pain location: Lateral foot and ankle Pain Quality: constant and burning  Radiating: Yes  Swelling: No  Popping, catching, locking: No  Numbness/Tingling: No Focal Weakness: Yes Aggravating factors: Touching lateral ankle, moving the ankle, ascending steps when leading with left foot, walking Relieving factors: Stopping activity, resting, brace temporarily relieves some pain 24-hour pain behavior: No night pain when sleeping on back, stretching in AM helps but feels unstable, PM pain depends on activity during the day History of prior back, hip, knee, or ankle injury, pain, surgery, or therapy: Yes; chronic hip, knee, foot pain Dominant hand: right Imaging: No  Typical footwear: Sneakers or rubber boots Red flags: Negative for personal history of cancer, chills/fever, night sweats, nausea, vomiting, unrelenting pain): Negative  PRECAUTIONS: None  WEIGHT BEARING RESTRICTIONS: No  FALLS: Has patient fallen in last 6 months? 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  OBJECTIVE:   Patient Surveys  08/14/23: FADI: FADI Activities Score: 80 / 104 or 77%  Cognition Patient is oriented to person, place, and time.  Recent memory is intact.  Remote memory is intact.  Attention span and concentration are intact.  Expressive speech is intact.  Patient's fund of knowledge is within normal limits for educational level.    Gross Musculoskeletal Assessment Bulk: Normal Tone: Normal No trophic changes noted to foot/ankle. No ecchymosis, erythema, or edema noted. No gross ankle/foot deformity noted  GAIT: Comments: Mild toe out gait on right foot. Antalgic gait noted on left side.  Posture: No noted discrepancies.  AROM AROM (Normal range in degrees) AROM       Hip Right Left  Flexion (125) WNL WNL  Extension (15) WNL WNL  Abduction (40) WNL WNL  Adduction  WNL WNL  Internal Rotation (45) WNL WNL  External Rotation (45) WNL WNL      Knee    Flexion (135) WNL WNL  Extension (0) WNL WNL      Ankle    Dorsiflexion (20) 10 (WB) 10 (WB)  Plantarflexion (50) 40 35  Inversion (35) 30 20  Eversion (15) 7 8  (* = pain; Blank rows = not tested)  LE MMT: MMT (out of 5) Right  Left   Hip flexion 4 4  Hip extension 4 4  Hip abduction 4 4+  Hip adduction 4+ 4  Hip internal rotation 5 5  Hip external rotation 5 5  Knee flexion 4 4  Knee extension 5 5  Ankle dorsiflexion 5 5  Ankle plantarflexion    Ankle inversion 5 5*  Ankle eversion 5 5*  (* = pain; Blank rows = not tested)  Sensation Diminished sensation left S1 and lateral aspect of left foot.  Reflexes Deferred  Palpation Location LEFT  RIGHT           Plantar fascia 1 0  Dorsal aspect of foot 1 0  Tarsal tunnel 1 0  (Blank rows = not tested) Graded on 0-4 scale (0 = no pain, 1 = pain, 2 = pain with wincing/grimacing/flinching, 3 = pain with withdrawal, 4 = unwilling to allow palpation), (Blank rows =  not tested)  Passive Accessory Motion Superior Tibiofibular Joint: WNL Inferior Tibiofibular Joint: WNL Talocrural Joint Distraction: WNL but noted pain with pulling feeling with left foot Talocrural Joint AP: L hypomobile, painful where hand is purchased Talocrural Joint PA: L hypomobile  SPECIAL TESTS Ligamentous Integrity Anterior Drawer (ATF, 10-15 plantarflexion with anterior translation): Negative Talar Tilt (CFL, inversion): Negative    TODAY'S TREATMENT: 01/30/2024   Subjective: Pt reports that she is doing alright today. She reports 2/10 R hip pain upon arrival and 3-4/10 L knee pain. Her L knee pain has been more problematic recently. No specific questions or concerns currently.   PAIN: 2/10 R hip pain, 3-4/10 L knee pain;   Ther-ex  NuStep L1-4 x 10 minutes for warm-up and BLE strengthening during interval history (5 minutes unbilled);  Standing hip strengthening with 4# ankle weights: Hip flexion marches x 15 BLE; HS curls x 15 BLE; Hip abduction x 15 BLE, discontinued secondary to pain with WB on LLE;  Seated clams with blue tband x 15; Seated  adductor ball squeeze x 15; Supine SLR with 4# AW 2 x 10 BLE; Hooklying bridge 2 x 10;  Supine hip abduction with manual resistance 2 x 15 BLE; SAQ over bolster with manual resistance 2 x 15  BLE;   Not today:  Ankle pumps 2 x 30s BLE; Seated ankle circumduction 2 x 30s each direction BLE; Seated resisted PF with green tband x 20 BLE; Seated resisted inversion with green tband x 20 BLE; Seated resisted eversion with green tband x 20 BLE; Supine isometric DF 3 x 20s BLE; Supine isometric PF 3 x 20s BLE; Ankle DF stretch 3 x 30s BLE; Prone L knee hamstring curls with manual resistance from therapist 2 x 10; Prone LLE straight knee hip extension 2 x 10; Prone L hip ER with knee at 90 flexion and manual resistance from therapist 2 x 10; R sidelying L hip reverse clam with manual resistance 2 x 10; Total Gym L22  double leg partial squats with green tband around thighs 2 x 10 BLE; Pball bridges with arms in front of chest 2 x 10;   PATIENT EDUCATION:  Education details: Pt educated throughout session about proper posture and technique with exercises. Improved exercise technique, movement at target joints, use of target muscles after min to mod verbal, visual, tactile cues.  Person educated: Patient Education method: Explanation, Actor cues, Verbal cues, and Handouts Education comprehension: verbalized understanding and returned demonstration   HOME EXERCISE PROGRAM:  Access Code: [email or web address] URL: [email or web address] Date: 12/11/2023 Prepared by: Selinda Eck  Exercises - Seated Ankle Eversion with Resistance  - 1 x daily - 7 x weekly - 2 sets - 10 reps - 3s hold - Seated Ankle Inversion with Resistance and Legs Crossed (Mirrored)  - 1 x daily - 7 x weekly - 2 sets - 10 reps - 3s hold - Seated Ankle Plantarflexion with Resistance  - 1 x daily - 7 x weekly - 2 sets - 10 reps - 3s hold - Long Sitting Calf Stretch with Strap  - 1  x daily - 7 x weekly - 3 reps - 30-45s hold - Standing Gastroc Stretch on Step  - 1 x daily - 7 x weekly - 3 sets - 10 reps - Standing Dorsiflexion Self-Mobilization on Step  - 1 x daily - 7 x weekly - 3 sets - 10 reps - Standing Dorsiflexion Self-Mobilization on Step (Mirrored)  - 1 x daily - 7 x weekly - 3 sets - 10 reps - Hooklying Isometric Hip Abduction with Belt  - 1 x daily - 7 x weekly - 2 sets - 5 reps - 30s hold/30s relax hold   ASSESSMENT:  CLINICAL IMPRESSION: Progressed to some additional seated and standing exercises during session today. She does have to transition back to non WB positions during session due to progressive worsening of L knee pain in SLS positions. Continued mat table strengthening for bilateral hip and LE musculature. She is able to tolerate resisted SAQ today without significant increase in pain. No HEP updates at this  time. Pt encouraged to follow-up as scheduled. She will benefit from PT services to address deficits in strength and pain in order to improve pain-free function at home and with leisure activities.   OBJECTIVE IMPAIRMENTS: decreased strength and hypomobility.   ACTIVITY LIMITATIONS: stairs and locomotion level  PARTICIPATION LIMITATIONS: occupation and yard work  PERSONAL FACTORS: Age, Behavior pattern, Past/current experiences, Time since onset of injury/illness/exacerbation, and 1-2 comorbidities: type II diabetes and left lumbar radiculopathy are also affecting patient's functional outcome.   REHAB POTENTIAL: Good  CLINICAL DECISION MAKING: Evolving/moderate complexity  EVALUATION COMPLEXITY: Moderate   GOALS: Goals reviewed with patient? Yes  SHORT TERM GOALS:   Pt will be independent with HEP to improve strength and decrease ankle pain to improve pain-free function at home and work. Baseline:  Goal status: ACHIEVED   LONG TERM GOALS: Target date: 03/04/2024   1.  Pt will decrease worst ankle pain by at least 3 points on the NPRS in order to demonstrate clinically significant reduction in ankle pain. Baseline: 06/08/23: 1/10 best, 5/10 worst; 08/14/23: worst: 1/10; Goal status: ACHIEVED  2.  Pt will increase FADI score by at least 4 points in order demonstrate clinically significant reduction in ankle pain/disability.       Baseline: 06/13/23: 65%; 08/14/23: 80 / 104 or 77% Goal status: ACHIEVED  3.  Pt will improve strength of ankle inversion/eversion with limited to no pain in order to demonstrate improvement in strength and function.  Baseline: 06/08/23: 5/5 with pain; 08/14/23: 5/5 without pain Goal status: ACHIEVED  4.  Pt will report at least 50% improvement in L ankle and knee symptoms in order to demonstrate improvement in her ability to work in the yard/garden with less pain.  Baseline: 08/14/23: 75% improvement Goal status: ACHIEVED  5.  Pt will decrease HAGOS score by  at least 20 points in order demonstrate clinically significant reduction in hip pain/disability.  Baseline: 08/14/23: HAGOS Score: 37.7%, 12/11/23: 44.3%  Goal status: PARTIALLY MET  6.  Pt will decrease worst posterior R hip pain by at least 3 points on the NPRS in order to demonstrate clinically significant reduction in hip pain. Baseline: 08/14/23: worst: 8/10; 12/11/23: 2/10; Goal status: ACHIEVED   PLAN: PT FREQUENCY: 1x/week  PT DURATION: 12 weeks  PLANNED INTERVENTIONS: Therapeutic exercises, Therapeutic activity, Neuromuscular re-education, Balance training, Gait training, Patient/Family education, Self Care, Joint mobilization, Joint manipulation, Vestibular training, Canalith repositioning, Orthotic/Fit training, DME instructions, Dry Needling, Electrical stimulation, Spinal manipulation, Spinal mobilization, Cryotherapy, Moist  heat, Taping, Traction, Ultrasound, Ionotophoresis 4mg /ml Dexamethasone , Manual therapy, and Re-evaluation.  PLAN FOR NEXT SESSION: strengthening and mobility exercises focused on R hip pain, review and modify HEP as necessary  Sani Madariaga D Salvator Seppala PT, DPT, GCS  1:43 PM,02/01/24

## 2024-01-30 ENCOUNTER — Ambulatory Visit

## 2024-01-30 DIAGNOSIS — M6281 Muscle weakness (generalized): Secondary | ICD-10-CM

## 2024-01-30 DIAGNOSIS — G8929 Other chronic pain: Secondary | ICD-10-CM | POA: Diagnosis not present

## 2024-01-30 DIAGNOSIS — M25562 Pain in left knee: Secondary | ICD-10-CM | POA: Diagnosis not present

## 2024-01-30 DIAGNOSIS — M25551 Pain in right hip: Secondary | ICD-10-CM | POA: Diagnosis not present

## 2024-01-31 NOTE — Progress Notes (Signed)
 Remote Loop Recorder Transmission

## 2024-02-01 NOTE — Therapy (Signed)
 OUTPATIENT PHYSICAL THERAPY ANKLE/KNEE/HIP TREATMENT  Patient Name: Brandy [name] MRN: [id number] DOB:1955-07-31, 68 y.o., female Today's Date: 02/05/2024  END OF SESSION:  PT End of Session - 02/05/24 1108     Visit Number 26    Number of Visits 61    Date for Recertification  03/04/24    Authorization Type eval: 06/08/23    PT Start Time 1104    PT Stop Time 1145    PT Time Calculation (min) 41 min    Activity Tolerance Patient tolerated treatment well    Behavior During Therapy [facility] for tasks assessed/performed         Past Medical History:  Diagnosis Date   Acute lower GI bleeding 01/19/2020   Arthritis    Atrial fibrillation with rapid ventricular response (HCC)    Chicken pox    Chronic systolic CHF (congestive heart failure) (HCC)    a. 03/2016 Echo: Ef 15-20%, diff HK, ant AK;  b. 05/2016 Echo: Ef 30-35%, diff HK, mildly dil LA/RA.   Diabetes mellitus without complication (HCC)    Dysrhythmia    GERD (gastroesophageal reflux disease)    Heart murmur    Hip discomfort    been going on for 40 years    History of kidney stones    Hyperlipidemia associated with type 2 diabetes mellitus (HCC) 05/12/2021   Hypertension    Moderate mitral regurgitation    a. 03/2016 Echo: mod MR in setting of LV dysfxn.   Motion sickness    car - back seat   NICM (nonischemic cardiomyopathy) (HCC)    a. 03/2016 Echo: EF 15-20%, diff HK, ant AK, mod MR, mod dil LA, mildly dil RA;  b. 04/2016 Cath: nl cors;  c. 05/2016 Echo: EF 30-35%, diff HK.   Obesity    Obstructive sleep apnea    compliant with CPAP   Persistent atrial fibrillation (HCC)    a. Dx 03/2016;  b. CHA2DS2VASc = 2-->Eliquis  5mg  BID;  b. 05/2016 Failed DCCV x 4.   Sleep apnea    Stomach irritation    Visit for monitoring Tikosyn  therapy 07/24/2016   Past Surgical History:  Procedure Laterality Date   ATRIAL FIBRILLATION ABLATION N/A 12/09/2020   Procedure: ATRIAL FIBRILLATION ABLATION;  Surgeon: Cindie Ole ONEIDA, MD;   Location: [facility];  Service: Cardiovascular;  Laterality: N/A;   BIOPSY N/A 01/12/2020   Procedure: BIOPSY;  Surgeon: Janalyn Keene NOVAK, MD;  Location: [facility];  Service: Endoscopy;  Laterality: N/A;   CARDIAC CATHETERIZATION N/A 04/17/2016   Procedure: Left Heart Cath and Coronary Angiography;  Surgeon: Deatrice DELENA Cage, MD;  Location: [facility] INV[facility] CV [facility];  Service: Cardiovascular;  Laterality: N/A;   CARDIOVERSION N/A 10/22/2020   Procedure: CARDIOVERSION;  Surgeon: Mady Bruckner, MD;  Location: [facility] OR[facility];  Service: Cardiovascular;  Laterality: N/A;   CARDIOVERSION N/A 01/18/2024   Procedure: CARDIOVERSION;  Surgeon: Shlomo Wilbert SAUNDERS, MD;  Location: [facility];  Service: Cardiovascular;  Laterality: N/A;   CHOLECYSTECTOMY     COLON SURGERY  2021   COLONOSCOPY N/A 01/20/2020   Procedure: COLONOSCOPY;  Surgeon: Maryruth Ole ONEIDA, MD;  Location: [facility];  Service: Endoscopy;  Laterality: N/A;   COLONOSCOPY WITH PROPOFOL  N/A 01/12/2020   Procedure: COLONOSCOPY WITH PROPOFOL ;  Surgeon: Janalyn Keene NOVAK, MD;  Location: [facility];  Service: Endoscopy;  Laterality: N/A;  Diabetic - oral meds sleep apnea   ELECTROPHYSIOLOGIC STUDY N/A 05/26/2016   Procedure: Cardioversion;  Surgeon: Deatrice  DELENA Cage, MD;  Location: [facility];  Service: Cardiovascular;  Laterality: N/A;   ESOPHAGOGASTRODUODENOSCOPY (EGD) WITH PROPOFOL  N/A 01/12/2020   Procedure: ESOPHAGOGASTRODUODENOSCOPY (EGD) WITH PROPOFOL ;  Surgeon: Janalyn Keene NOVAK, MD;  Location: [facility];  Service: Endoscopy;  Laterality: N/A;   POLYPECTOMY N/A 01/12/2020   Procedure: POLYPECTOMY;  Surgeon: Janalyn Keene NOVAK, MD;  Location: [facility];  Service: Endoscopy;  Laterality: N/A;   Patient Active Problem List   Diagnosis Date Noted   Mucous cyst of digit of left hand 05/18/2023   Peroneal tendinitis, left 10/13/2022   Left lumbar radiculopathy 07/26/2022   Arthralgia of  left knee 07/26/2022   Pes anserinus bursitis of right knee 04/19/2022   Osteoarthritis of both knees 03/27/2022   Greater trochanteric pain syndrome of both lower extremities 03/27/2022   It band syndrome, right 03/27/2022   Hyperlipidemia associated with type 2 diabetes mellitus (HCC) 05/12/2021   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   Varicose veins of leg with pain, bilateral 07/06/2020   Type II diabetes mellitus with complication (HCC) 01/19/2020   Gastric polyp    Colon polyps    Varicose veins of both lower extremities 10/20/2019   Dysphagia 10/20/2019   Osteoarthritis of right hand 10/31/2016   Nonischemic cardiomyopathy (HCC) 04/14/2016   BMI 33.0-33.9,adult 04/14/2016   Persistent atrial fibrillation (HCC) 04/12/2016    PCP: Leita Adie, MD  REFERRING PROVIDER: Selinda Ku, MD  REFERRING DIAG:  [id number] (ICD-10-CM) - Peroneal tendinitis, left  M54.16 (ICD-10-CM) - Left lumbar radiculopathy  M25.562 (ICD-10-CM) - Arthralgia of left knee  M17.11 (ICD-10-CM) - Primary osteoarthritis of right knee  M76.31 (ICD-10-CM) - It band syndrome, right  M25.551 (ICD-10-CM) - Greater 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   Rationale for Evaluation and Treatment: Rehabilitation  THERAPY DIAG: Muscle weakness (generalized)  Pain in right hip  Chronic pain of left knee  ONSET DATE: Chronic, aggravated over Christmas (December 2024)  FOLLOW-UP APPT SCHEDULED WITH REFERRING PROVIDER: No, initially no but now has appt for July;  INITIAL EVALUATION: SUBJECTIVE:                                                                                                                                                                                         SUBJECTIVE STATEMENT:  Left ankle and foot pain  PERTINENT HISTORY: Pt reports pain in left ankle that seems to have flared up over Christmas after pt reported excess walking. Pt reports feeling pulling and  burning at the base of the lateral ankle. She has previously been to PT for pain in her left hip and knee but has now noticed increased  pain in the left ankle/foot. Pt expresses difficulty with exercises; specifically pressure on the toes, stretching calves, and rocking on toes. Pt notes pain is as high as 5/10 NPS but is constant and never really goes away, with lowest pain at 1/10 NPS.  PAIN:    Pain Intensity: Present: 1/10, Best: 1/10, Worst: 5/10 Pain location: Lateral foot and ankle Pain Quality: constant and burning  Radiating: Yes  Swelling: No  Popping, catching, locking: No  Numbness/Tingling: No Focal Weakness: Yes Aggravating factors: Touching lateral ankle, moving the ankle, ascending steps when leading with left foot, walking Relieving factors: Stopping activity, resting, brace temporarily relieves some pain 24-hour pain behavior: No night pain when sleeping on back, stretching in AM helps but feels unstable, PM pain depends on activity during the day History of prior back, hip, knee, or ankle injury, pain, surgery, or therapy: Yes; chronic hip, knee, foot pain Dominant hand: right Imaging: No  Typical footwear: Sneakers or rubber boots Red flags: Negative for personal history of cancer, chills/fever, night sweats, nausea, vomiting, unrelenting pain): Negative  PRECAUTIONS: None  WEIGHT BEARING RESTRICTIONS: No  FALLS: Has patient fallen in last 6 months? 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  OBJECTIVE:   Patient Surveys  08/14/23: FADI: FADI Activities Score: 80 / 104 or 77%  Cognition Patient is oriented to person, place, and time.  Recent memory is intact.  Remote memory is intact.  Attention span and concentration are intact.  Expressive speech is intact.  Patient's fund of knowledge is within normal limits for educational level.    Gross Musculoskeletal Assessment Bulk: Normal Tone: Normal No trophic changes noted to foot/ankle. No ecchymosis, erythema, or edema noted. No gross ankle/foot deformity noted  GAIT: Comments: Mild toe out gait on right foot. Antalgic gait noted on left side.  Posture: No noted discrepancies.  AROM AROM (Normal range in degrees) AROM       Hip Right Left  Flexion (125) WNL WNL  Extension (15) WNL WNL  Abduction (40) WNL WNL  Adduction  WNL WNL  Internal Rotation (45) WNL WNL  External Rotation (45) WNL WNL      Knee    Flexion (135) WNL WNL  Extension (0) WNL WNL      Ankle    Dorsiflexion (20) 10 (WB) 10 (WB)  Plantarflexion (50) 40 35  Inversion (35) 30 20  Eversion (15) 7 8  (* = pain; Blank rows = not tested)  LE MMT: MMT (out of 5) Right  Left   Hip flexion 4 4  Hip extension 4 4  Hip abduction 4 4+  Hip adduction 4+ 4  Hip internal rotation 5 5  Hip external rotation 5 5  Knee flexion 4 4  Knee extension 5 5  Ankle dorsiflexion 5 5  Ankle plantarflexion    Ankle inversion 5 5*  Ankle eversion 5 5*  (* = pain; Blank rows = not tested)  Sensation Diminished sensation left S1 and lateral  aspect of left foot.  Reflexes Deferred  Palpation Location LEFT  RIGHT           Plantar fascia 1 0  Dorsal aspect of foot 1 0  Tarsal tunnel 1 0  (Blank rows = not tested) Graded on 0-4 scale (0 = no pain, 1 = pain, 2 = pain with wincing/grimacing/flinching, 3 = pain with withdrawal, 4 = unwilling to allow  palpation), (Blank rows = not tested)  Passive Accessory Motion Superior Tibiofibular Joint: WNL Inferior Tibiofibular Joint: WNL Talocrural Joint Distraction: WNL but noted pain with pulling feeling with left foot Talocrural Joint AP: L hypomobile, painful where hand is purchased Talocrural Joint PA: L hypomobile  SPECIAL TESTS Ligamentous Integrity Anterior Drawer (ATF, 10-15 plantarflexion with anterior translation): Negative Talar Tilt (CFL, inversion): Negative    TODAY'S TREATMENT: 02/05/2024   Subjective: Pt reports that she is doing well today. She went to the chiropractor last week and that has helped her L knee pain. Denies resting pain upon arrival. HEP going well.  No specific questions or concerns.    PAIN: Denies   Therapeutic Activity  NuStep L1-4 x 10 minutes for BLE strengthening during interval history; Total Gym L22 double leg partial squats 2 x 10; TG L22 double leg heel raises 2 x 20;   Ther-ex  Supine SLR with 3# AW x 12, x 15 BLE; Hooklying bridge 2 x 15; Sidelying clams with manual resistance 2 x 10 BLE; Sidelying reverse clam with manual resistance 2 x 10 BLE; Sidelying hip abduction with 3# AW 2 x 10 BLE;   Not today:  Ankle pumps 2 x 30s BLE; Seated ankle circumduction 2 x 30s each direction BLE; Seated resisted PF with green tband x 20 BLE; Seated resisted inversion with green tband x 20 BLE; Seated resisted eversion with green tband x 20 BLE; Supine isometric DF 3 x 20s BLE; Supine isometric PF 3 x 20s BLE; Ankle DF stretch 3 x 30s BLE; Prone L knee hamstring curls with manual resistance from therapist 2 x 10; Prone LLE straight knee hip extension 2 x 10; Prone L hip ER with knee at 90 flexion and manual resistance from therapist 2 x 10; Pball bridges with arms in front of chest 2 x 10; Standing hip strengthening with 4# ankle weights: Hip flexion marches x 15 BLE; HS curls x 15 BLE; Hip abduction x 15 BLE, discontinued secondary to  pain with WB on LLE; Seated clams with blue tband x 15; Seated  adductor ball squeeze x 15; Supine hip abduction with manual resistance 2 x 15 BLE; SAQ over bolster with manual resistance 2 x 15  BLE;   PATIENT EDUCATION:  Education details: Pt educated throughout session about proper posture and technique with exercises. Improved exercise technique, movement at target joints, use of target muscles after min to mod verbal, visual, tactile cues.  Person educated: Patient Education method: Explanation, Actor cues, Verbal cues, and Handouts Education comprehension: verbalized understanding and returned demonstration   HOME EXERCISE PROGRAM:  Access Code: [email or web address] URL: [email or web address] Date: 12/11/2023 Prepared by: Selinda Eck  Exercises - Seated Ankle Eversion with Resistance  - 1 x daily - 7 x weekly - 2 sets - 10 reps - 3s hold - Seated Ankle Inversion with Resistance and Legs Crossed (Mirrored)  - 1 x daily - 7 x weekly - 2 sets - 10 reps - 3s hold - Seated Ankle Plantarflexion with Resistance  - 1 x daily - 7 x weekly -  2 sets - 10 reps - 3s hold - Long Sitting Calf Stretch with Strap  - 1 x daily - 7 x weekly - 3 reps - 30-45s hold - Standing Gastroc Stretch on Step  - 1 x daily - 7 x weekly - 3 sets - 10 reps - Standing Dorsiflexion Self-Mobilization on Step  - 1 x daily - 7 x weekly - 3 sets - 10 reps - Standing Dorsiflexion Self-Mobilization on Step (Mirrored)  - 1 x daily - 7 x weekly - 3 sets - 10 reps - Hooklying Isometric Hip Abduction with Belt  - 1 x daily - 7 x weekly - 2 sets - 5 reps - 30s hold/30s relax hold   ASSESSMENT:  CLINICAL IMPRESSION: Utilized Total Gym for BLE strengthening however afterward she reports slight increase in L knee pain so regressed back to mat table exercises for bilateral hip and LE musculature.  No HEP updates at this time. Plan to progress seated and standing exercises in future session as pain allows. Pt encouraged  to follow-up as scheduled. She will benefit from PT services to address deficits in strength and pain in order to improve pain-free function at home and with leisure activities.   OBJECTIVE IMPAIRMENTS: decreased strength and hypomobility.   ACTIVITY LIMITATIONS: stairs and locomotion level  PARTICIPATION LIMITATIONS: occupation and yard work  PERSONAL FACTORS: Age, Behavior pattern, Past/current experiences, Time since onset of injury/illness/exacerbation, and 1-2 comorbidities: type II diabetes and left lumbar radiculopathy are also affecting patient's functional outcome.   REHAB POTENTIAL: Good  CLINICAL DECISION MAKING: Evolving/moderate complexity  EVALUATION COMPLEXITY: Moderate   GOALS: Goals reviewed with patient? Yes  SHORT TERM GOALS:   Pt will be independent with HEP to improve strength and decrease ankle pain to improve pain-free function at home and work. Baseline:  Goal status: ACHIEVED   LONG TERM GOALS: Target date: 03/04/2024   1.  Pt will decrease worst ankle pain by at least 3 points on the NPRS in order to demonstrate clinically significant reduction in ankle pain. Baseline: 06/08/23: 1/10 best, 5/10 worst; 08/14/23: worst: 1/10; Goal status: ACHIEVED  2.  Pt will increase FADI score by at least 4 points in order demonstrate clinically significant reduction in ankle pain/disability.       Baseline: 06/13/23: 65%; 08/14/23: 80 / 104 or 77% Goal status: ACHIEVED  3.  Pt will improve strength of ankle inversion/eversion with limited to no pain in order to demonstrate improvement in strength and function.  Baseline: 06/08/23: 5/5 with pain; 08/14/23: 5/5 without pain Goal status: ACHIEVED  4.  Pt will report at least 50% improvement in L ankle and knee symptoms in order to demonstrate improvement in her ability to work in the yard/garden with less pain.  Baseline: 08/14/23: 75% improvement Goal status: ACHIEVED  5.  Pt will decrease HAGOS score by at least 20 points  in order demonstrate clinically significant reduction in hip pain/disability.  Baseline: 08/14/23: HAGOS Score: 37.7%, 12/11/23: 44.3%  Goal status: PARTIALLY MET  6.  Pt will decrease worst posterior R hip pain by at least 3 points on the NPRS in order to demonstrate clinically significant reduction in hip pain. Baseline: 08/14/23: worst: 8/10; 12/11/23: 2/10; Goal status: ACHIEVED   PLAN: PT FREQUENCY: 1x/week  PT DURATION: 12 weeks  PLANNED INTERVENTIONS: Therapeutic exercises, Therapeutic activity, Neuromuscular re-education, Balance training, Gait training, Patient/Family education, Self Care, Joint mobilization, Joint manipulation, Vestibular training, Canalith repositioning, Orthotic/Fit training, DME instructions, Dry Needling, Electrical stimulation, Spinal manipulation,  Spinal mobilization, Cryotherapy, Moist heat, Taping, Traction, Ultrasound, Ionotophoresis 4mg /ml Dexamethasone , Manual therapy, and Re-evaluation.  PLAN FOR NEXT SESSION: strengthening and mobility exercises focused on R hip pain, review and modify HEP as necessary  Mitchael Luckey D Rayona Sardinha PT, DPT, GCS  12:21 PM,02/05/24

## 2024-02-05 ENCOUNTER — Ambulatory Visit

## 2024-02-05 DIAGNOSIS — G8929 Other chronic pain: Secondary | ICD-10-CM

## 2024-02-05 DIAGNOSIS — M25551 Pain in right hip: Secondary | ICD-10-CM

## 2024-02-05 DIAGNOSIS — M25562 Pain in left knee: Secondary | ICD-10-CM | POA: Diagnosis not present

## 2024-02-05 DIAGNOSIS — M6281 Muscle weakness (generalized): Secondary | ICD-10-CM | POA: Diagnosis not present

## 2024-02-07 ENCOUNTER — Encounter: Attending: Student | Admitting: Dietician

## 2024-02-07 DIAGNOSIS — Z713 Dietary counseling and surveillance: Secondary | ICD-10-CM | POA: Insufficient documentation

## 2024-02-07 DIAGNOSIS — E119 Type 2 diabetes mellitus without complications: Secondary | ICD-10-CM | POA: Diagnosis not present

## 2024-02-07 DIAGNOSIS — E118 Type 2 diabetes mellitus with unspecified complications: Secondary | ICD-10-CM

## 2024-02-07 DIAGNOSIS — Z7984 Long term (current) use of oral hypoglycemic drugs: Secondary | ICD-10-CM | POA: Insufficient documentation

## 2024-02-07 NOTE — Patient Instructions (Addendum)
 Choose pre-portioned snacks like 100 calorie Emerald nuts, small bags of popcorn  Work towards eating three meals a day, about 5-6 hours apart!  Begin to recognize carbohydrates, proteins, and non-starchy vegetables in your food choices!  Begin to build your meals using the proportions of the Balanced Plate. First, select your carb choice(s) for the meal. Make this 25% of your meal. Next, select your source of protein to pair with your carb choice(s). Make this another 25% of your meal. Choose LEAN proteins Finally, complete your meal with a variety of non-starchy vegetables. Make this the remaining 50% of your meal.

## 2024-02-07 NOTE — Progress Notes (Unsigned)
 Diabetes Self-Management Education  Visit Type: First/Initial  Appt. Start Time: 1520 Appt. End Time: 1630  02/08/2024  [name], identified by name and date of birth, is a 68 y.o. female with a diagnosis of Diabetes: Type 2.   ASSESSMENT Pt reports interest in reducing medications for DM, currently taking Farxiga  @10  mg, Metformin  @500  mg BID Pt reports checking FBG daily, numbers are typically ~150 mg/dL, states they get lower fasting values at times when eating a snack (Cheez Its) before bed, when checking PPBG, almost always below 180 mg/dL. Pt reports getting cortisone shots in July that caused Afib, required cardioversion, currently on Eliquis  and hoping to get off of it at next cardiology visit (02/20/2024). Pt states this was very stressful and noticed increased glucose during that time. Pt reports usually taking their lunch to work, usually lean deli meat sandwich on seed bread Pt reports difficulty with portion control at times, will snack when reading books/watching TV in the evening (popcorn, cookies, nuts/chocolate) Pt reports doing PT for hip/knees/ankle for 45 minutes on Tuesdays, doing yard work in the evenings most days of the week, stays active gardening for multiple hours, takes pottery class once a week.    Individualized Plan for Diabetes Self-Management Training:   Learning Objective:  Patient will have a greater understanding of diabetes self-management. Patient education plan is to attend individual and/or group sessions per assessed needs and concerns.   Plan:   Patient Instructions  Choose pre-portioned snacks like 100 calorie Emerald nuts, small bags of popcorn  Work towards eating three meals a day, about 5-6 hours apart!  Begin to recognize carbohydrates, proteins, and non-starchy vegetables in your food choices!  Begin to build your meals using the proportions of the Balanced Plate. First, select your carb choice(s) for the meal. Make this 25% of  your meal. Next, select your source of protein to pair with your carb choice(s). Make this another 25% of your meal. Choose LEAN proteins Finally, complete your meal with a variety of non-starchy vegetables. Make this the remaining 50% of your meal.   Expected Outcomes:  Demonstrated interest in learning. Expect positive outcomes  Education material provided: [name] - How to Thrive: A Guide for Your Journey with Diabetes  If problems or questions, patient to contact team via:  Phone and Email  Future DSME appointment: 2 months

## 2024-02-08 ENCOUNTER — Encounter: Payer: Self-pay | Admitting: Dietician

## 2024-02-09 NOTE — Progress Notes (Signed)
 Remote Loop Recorder Transmission

## 2024-02-11 ENCOUNTER — Other Ambulatory Visit: Payer: Self-pay

## 2024-02-11 ENCOUNTER — Other Ambulatory Visit (HOSPITAL_COMMUNITY): Payer: Self-pay

## 2024-02-11 DIAGNOSIS — I4819 Other persistent atrial fibrillation: Secondary | ICD-10-CM

## 2024-02-11 MED ORDER — APIXABAN 5 MG PO TABS
5.0000 mg | ORAL_TABLET | Freq: Two times a day (BID) | ORAL | 5 refills | Status: AC
  Filled 2024-02-11 – 2024-04-06 (×2): qty 60, 30d supply, fill #0
  Filled 2024-05-01: qty 60, 30d supply, fill #1
  Filled 2024-06-03: qty 60, 30d supply, fill #2

## 2024-02-11 NOTE — Telephone Encounter (Signed)
 RX routed to [facility]

## 2024-02-11 NOTE — Telephone Encounter (Signed)
 Eliquis  5mg  refill request received. Patient is 68 years old, weight-107kg, Crea-0.93 on 12/19/23, Diagnosis-Afib, and last seen by Charlies Arthur on 12/19/23. Dose is appropriate based on dosing criteria. Will send in refill to requested pharmacy.

## 2024-02-11 NOTE — Therapy (Signed)
 OUTPATIENT PHYSICAL THERAPY ANKLE/KNEE/HIP TREATMENT  Patient Name: Brandy [name] MRN: [id number] DOB:1956-02-13, 68 y.o., female Today's Date: 02/12/2024  END OF SESSION:  PT End of Session - 02/12/24 1143     Visit Number 27    Number of Visits 61    Date for Recertification  03/04/24    Authorization Type eval: 06/08/23    PT Start Time 1145    PT Stop Time 1230    PT Time Calculation (min) 45 min    Activity Tolerance Patient tolerated treatment well    Behavior During Therapy [facility] for tasks assessed/performed         Past Medical History:  Diagnosis Date   Acute lower GI bleeding 01/19/2020   Arthritis    Atrial fibrillation with rapid ventricular response (HCC)    Chicken pox    Chronic systolic CHF (congestive heart failure) (HCC)    a. 03/2016 Echo: Ef 15-20%, diff HK, ant AK;  b. 05/2016 Echo: Ef 30-35%, diff HK, mildly dil LA/RA.   Diabetes mellitus without complication (HCC)    Dysrhythmia    GERD (gastroesophageal reflux disease)    Heart murmur    Hip discomfort    been going on for 40 years    History of kidney stones    Hyperlipidemia associated with type 2 diabetes mellitus (HCC) 05/12/2021   Hypertension    Moderate mitral regurgitation    a. 03/2016 Echo: mod MR in setting of LV dysfxn.   Motion sickness    car - back seat   NICM (nonischemic cardiomyopathy) (HCC)    a. 03/2016 Echo: EF 15-20%, diff HK, ant AK, mod MR, mod dil LA, mildly dil RA;  b. 04/2016 Cath: nl cors;  c. 05/2016 Echo: EF 30-35%, diff HK.   Obesity    Obstructive sleep apnea    compliant with CPAP   Persistent atrial fibrillation (HCC)    a. Dx 03/2016;  b. CHA2DS2VASc = 2-->Eliquis  5mg  BID;  b. 05/2016 Failed DCCV x 4.   Sleep apnea    Stomach irritation    Visit for monitoring Tikosyn  therapy 07/24/2016   Past Surgical History:  Procedure Laterality Date   ATRIAL FIBRILLATION ABLATION N/A 12/09/2020   Procedure: ATRIAL FIBRILLATION ABLATION;  Surgeon: Cindie Ole ONEIDA, MD;   Location: [facility];  Service: Cardiovascular;  Laterality: N/A;   BIOPSY N/A 01/12/2020   Procedure: BIOPSY;  Surgeon: Janalyn Keene NOVAK, MD;  Location: [facility];  Service: Endoscopy;  Laterality: N/A;   CARDIAC CATHETERIZATION N/A 04/17/2016   Procedure: Left Heart Cath and Coronary Angiography;  Surgeon: Deatrice DELENA Cage, MD;  Location: [facility] INV[facility]IVE CV LAB;  Service: Cardiovascular;  Laterality: N/A;   CARDIOVERSION N/A 10/22/2020   Procedure: CARDIOVERSION;  Surgeon: Mady Bruckner, MD;  Location: [facility] ORS;  Service: Cardiovascular;  Laterality: N/A;   CARDIOVERSION N/A 01/18/2024   Procedure: CARDIOVERSION;  Surgeon: Shlomo Wilbert SAUNDERS, MD;  Location: [facility];  Service: Cardiovascular;  Laterality: N/A;   CHOLECYSTECTOMY     COLON SURGERY  2021   COLONOSCOPY N/A 01/20/2020   Procedure: COLONOSCOPY;  Surgeon: Maryruth Ole ONEIDA, MD;  Location: [facility] ENDOSCOPY;  Service: Endoscopy;  Laterality: N/A;   COLONOSCOPY WITH PROPOFOL  N/A 01/12/2020   Procedure: COLONOSCOPY WITH PROPOFOL ;  Surgeon: Janalyn Keene NOVAK, MD;  Location: [facility];  Service: Endoscopy;  Laterality: N/A;  Diabetic - oral meds sleep apnea   ELECTROPHYSIOLOGIC STUDY N/A 05/26/2016   Procedure: Cardioversion;  Surgeon: Deatrice  DELENA Cage, MD;  Location: [facility] ORS;  Service: Cardiovascular;  Laterality: N/A;   ESOPHAGOGASTRODUODENOSCOPY (EGD) WITH PROPOFOL  N/A 01/12/2020   Procedure: ESOPHAGOGASTRODUODENOSCOPY (EGD) WITH PROPOFOL ;  Surgeon: Janalyn Keene NOVAK, MD;  Location: [facility];  Service: Endoscopy;  Laterality: N/A;   POLYPECTOMY N/A 01/12/2020   Procedure: POLYPECTOMY;  Surgeon: Janalyn Keene NOVAK, MD;  Location: [facility];  Service: Endoscopy;  Laterality: N/A;   Patient Active Problem List   Diagnosis Date Noted   Mucous cyst of digit of left hand 05/18/2023   Peroneal tendinitis, left 10/13/2022   Left lumbar radiculopathy 07/26/2022   Arthralgia of  left knee 07/26/2022   Pes anserinus bursitis of right knee 04/19/2022   Osteoarthritis of both knees 03/27/2022   Greater trochanteric pain syndrome of both lower extremities 03/27/2022   It band syndrome, right 03/27/2022   Hyperlipidemia associated with type 2 diabetes mellitus (HCC) 05/12/2021   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   Varicose veins of leg with pain, bilateral 07/06/2020   Type II diabetes mellitus with complication (HCC) 01/19/2020   Gastric polyp    Colon polyps    Varicose veins of both lower extremities 10/20/2019   Dysphagia 10/20/2019   Osteoarthritis of right hand 10/31/2016   Nonischemic cardiomyopathy (HCC) 04/14/2016   BMI 33.0-33.9,adult 04/14/2016   Persistent atrial fibrillation (HCC) 04/12/2016    PCP: Leita Adie, MD  REFERRING PROVIDER: Selinda Ku, MD  REFERRING DIAG:  [phone] (ICD-10-CM) - Peroneal tendinitis, left  M54.16 (ICD-10-CM) - Left lumbar radiculopathy  M25.562 (ICD-10-CM) - Arthralgia of left knee  M17.11 (ICD-10-CM) - Primary osteoarthritis of right knee  M76.31 (ICD-10-CM) - It band syndrome, right  M25.551 (ICD-10-CM) - Greater 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   Rationale for Evaluation and Treatment: Rehabilitation  THERAPY DIAG: Muscle weakness (generalized)  Pain in right hip  Chronic pain of left knee  ONSET DATE: Chronic, aggravated over Christmas (December 2024)  FOLLOW-UP APPT SCHEDULED WITH REFERRING PROVIDER: No, initially no but now has appt for July;  INITIAL EVALUATION: SUBJECTIVE:                                                                                                                                                                                         SUBJECTIVE STATEMENT:  Left ankle and foot pain  PERTINENT HISTORY: Pt reports pain in left ankle that seems to have flared up over Christmas after pt reported excess walking. Pt reports feeling pulling and  burning at the base of the lateral ankle. She has previously been to PT for pain in her left hip and knee but has now noticed increased  pain in the left ankle/foot. Pt expresses difficulty with exercises; specifically pressure on the toes, stretching calves, and rocking on toes. Pt notes pain is as high as 5/10 NPS but is constant and never really goes away, with lowest pain at 1/10 NPS.  PAIN:    Pain Intensity: Present: 1/10, Best: 1/10, Worst: 5/10 Pain location: Lateral foot and ankle Pain Quality: constant and burning  Radiating: Yes  Swelling: No  Popping, catching, locking: No  Numbness/Tingling: No Focal Weakness: Yes Aggravating factors: Touching lateral ankle, moving the ankle, ascending steps when leading with left foot, walking Relieving factors: Stopping activity, resting, brace temporarily relieves some pain 24-hour pain behavior: No night pain when sleeping on back, stretching in AM helps but feels unstable, PM pain depends on activity during the day History of prior back, hip, knee, or ankle injury, pain, surgery, or therapy: Yes; chronic hip, knee, foot pain Dominant hand: right Imaging: No  Typical footwear: Sneakers or rubber boots Red flags: Negative for personal history of cancer, chills/fever, night sweats, nausea, vomiting, unrelenting pain): Negative  PRECAUTIONS: None  WEIGHT BEARING RESTRICTIONS: No  FALLS: Has patient fallen in last 6 months? 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  OBJECTIVE:   Patient Surveys  08/14/23: FADI: FADI Activities Score: 80 / 104 or 77%  Cognition Patient is oriented to person, place, and time.  Recent memory is intact.  Remote memory is intact.  Attention span and concentration are intact.  Expressive speech is intact.  Patient's fund of knowledge is within normal limits for educational level.    Gross Musculoskeletal Assessment Bulk: Normal Tone: Normal No trophic changes noted to foot/ankle. No ecchymosis, erythema, or edema noted. No gross ankle/foot deformity noted  GAIT: Comments: Mild toe out gait on right foot. Antalgic gait noted on left side.  Posture: No noted discrepancies.  AROM AROM (Normal range in degrees) AROM       Hip Right Left  Flexion (125) WNL WNL  Extension (15) WNL WNL  Abduction (40) WNL WNL  Adduction  WNL WNL  Internal Rotation (45) WNL WNL  External Rotation (45) WNL WNL      Knee    Flexion (135) WNL WNL  Extension (0) WNL WNL      Ankle    Dorsiflexion (20) 10 (WB) 10 (WB)  Plantarflexion (50) 40 35  Inversion (35) 30 20  Eversion (15) 7 8  (* = pain; Blank rows = not tested)  LE MMT: MMT (out of 5) Right  Left   Hip flexion 4 4  Hip extension 4 4  Hip abduction 4 4+  Hip adduction 4+ 4  Hip internal rotation 5 5  Hip external rotation 5 5  Knee flexion 4 4  Knee extension 5 5  Ankle dorsiflexion 5 5  Ankle plantarflexion    Ankle inversion 5 5*  Ankle eversion 5 5*  (* = pain; Blank rows = not tested)  Sensation Diminished sensation left S1 and lateral  aspect of left foot.  Reflexes Deferred  Palpation Location LEFT  RIGHT           Plantar fascia 1 0  Dorsal aspect of foot 1 0  Tarsal tunnel 1 0  (Blank rows = not tested) Graded on 0-4 scale (0 = no pain, 1 = pain, 2 = pain with wincing/grimacing/flinching, 3 = pain with withdrawal, 4 = unwilling to allow  palpation), (Blank rows = not tested)  Passive Accessory Motion Superior Tibiofibular Joint: WNL Inferior Tibiofibular Joint: WNL Talocrural Joint Distraction: WNL but noted pain with pulling feeling with left foot Talocrural Joint AP: L hypomobile, painful where hand is purchased Talocrural Joint PA: L hypomobile  SPECIAL TESTS Ligamentous Integrity Anterior Drawer (ATF, 10-15 plantarflexion with anterior translation): Negative Talar Tilt (CFL, inversion): Negative    TODAY'S TREATMENT: 02/12/2024    Subjective: Pt states that she accidentally took a double dose of her metoprolol  and Eliquis  this morning. She took the accidental second dose about 2 hours ago. She denies any dizziness or lightheadedness. She called RN triage at her PCP office who encouraged her to monitor symptoms, stay well hydrated, and hold her evening dose. She is asking to check her vitals upon arrival today. Reports ongoing L knee pain.    PAIN: L knee pain    Therapeutic Activity  Vitals at start of session: BP: 126/44, HR: 58, SpO2%: 99% NuStep L1-4 x 10 minutes for BLE strengthening during interval history; Vitals after NuStep: BP: 125/46, HR: 57, SpO2%: 97% Total Gym L22 double leg partial squats 2 x 15; TG L22 double leg heel raises 2 x 15;   Ther-ex  Supine SLR with 3# AW 2 x 10 BLE; Hooklying bridge 2 x 10 BLE; Hooklying clams with manual resistance 2 x 10 BLE; Hooklying adductor squeeze with manual resistance 2 x 10 BLE; Supine straight leg hip abduction/adduction with manual resistance 2 x 10 BLE;   Not today:  Ankle pumps 2 x 30s BLE; Seated ankle circumduction 2 x 30s each direction BLE; Seated resisted PF with green tband x 20 BLE; Seated resisted inversion with green tband x 20 BLE; Seated resisted eversion with green tband x 20 BLE; Supine isometric DF 3 x 20s BLE; Supine isometric PF 3 x 20s BLE; Ankle DF stretch 3 x 30s BLE; Prone L knee hamstring curls with manual resistance  from therapist 2 x 10; Prone LLE straight knee hip extension 2 x 10; Prone L hip ER with knee at 90 flexion and manual resistance from therapist 2 x 10; Pball bridges with arms in front of chest 2 x 10; Standing hip strengthening with 4# ankle weights: Hip flexion marches x 15 BLE; HS curls x 15 BLE; SAQ over bolster with manual resistance 2 x 15  BLE; Sidelying reverse clam with manual resistance 2 x 10 BLE; Sidelying hip abduction with 3# AW 2 x 10 BLE;   PATIENT EDUCATION:  Education details: Pt educated throughout session about proper posture and technique with exercises. Improved exercise technique, movement at target joints, use of target muscles after min to mod verbal, visual, tactile cues.  Person educated: Patient Education method: Explanation, Actor cues, Verbal cues, and Handouts Education comprehension: verbalized understanding and returned demonstration   HOME EXERCISE PROGRAM:  Access Code: [email or web address] URL: [email or web address] Date: 12/11/2023 Prepared by: Selinda Eck  Exercises - Seated Ankle Eversion with Resistance  - 1 x daily - 7 x weekly - 2 sets - 10 reps - 3s hold - Seated Ankle  Inversion with Resistance and Legs Crossed (Mirrored)  - 1 x daily - 7 x weekly - 2 sets - 10 reps - 3s hold - Seated Ankle Plantarflexion with Resistance  - 1 x daily - 7 x weekly - 2 sets - 10 reps - 3s hold - Long Sitting Calf Stretch with Strap  - 1 x daily - 7 x weekly - 3 reps - 30-45s hold - Standing Gastroc Stretch on Step  - 1 x daily - 7 x weekly - 3 sets - 10 reps - Standing Dorsiflexion Self-Mobilization on Step  - 1 x daily - 7 x weekly - 3 sets - 10 reps - Standing Dorsiflexion Self-Mobilization on Step (Mirrored)  - 1 x daily - 7 x weekly - 3 sets - 10 reps - Hooklying Isometric Hip Abduction with Belt  - 1 x daily - 7 x weekly - 2 sets - 5 reps - 30s hold/30s relax hold   ASSESSMENT:  CLINICAL IMPRESSION: Utilized Total Gym for BLE strengthening  again today followed by mat table exercises for bilateral hip and LE musculature. Vitals are stable throughout session without any reported dizziness or lightheadedness. RN notified of vitals at her PCP office. No HEP updates at this time. Plan to progress seated and standing exercises in future session as pain allows. Pt encouraged to follow-up as scheduled. She will benefit from PT services to address deficits in strength and pain in order to improve pain-free function at home and with leisure activities.   OBJECTIVE IMPAIRMENTS: decreased strength and hypomobility.   ACTIVITY LIMITATIONS: stairs and locomotion level  PARTICIPATION LIMITATIONS: occupation and yard work  PERSONAL FACTORS: Age, Behavior pattern, Past/current experiences, Time since onset of injury/illness/exacerbation, and 1-2 comorbidities: type II diabetes and left lumbar radiculopathy are also affecting patient's functional outcome.   REHAB POTENTIAL: Good  CLINICAL DECISION MAKING: Evolving/moderate complexity  EVALUATION COMPLEXITY: Moderate   GOALS: Goals reviewed with patient? Yes  SHORT TERM GOALS:   Pt will be independent with HEP to improve strength and decrease ankle pain to improve pain-free function at home and work. Baseline:  Goal status: ACHIEVED   LONG TERM GOALS: Target date: 03/04/2024   1.  Pt will decrease worst ankle pain by at least 3 points on the NPRS in order to demonstrate clinically significant reduction in ankle pain. Baseline: 06/08/23: 1/10 best, 5/10 worst; 08/14/23: worst: 1/10; Goal status: ACHIEVED  2.  Pt will increase FADI score by at least 4 points in order demonstrate clinically significant reduction in ankle pain/disability.       Baseline: 06/13/23: 65%; 08/14/23: 80 / 104 or 77% Goal status: ACHIEVED  3.  Pt will improve strength of ankle inversion/eversion with limited to no pain in order to demonstrate improvement in strength and function.  Baseline: 06/08/23: 5/5 with pain;  08/14/23: 5/5 without pain Goal status: ACHIEVED  4.  Pt will report at least 50% improvement in L ankle and knee symptoms in order to demonstrate improvement in her ability to work in the yard/garden with less pain.  Baseline: 08/14/23: 75% improvement Goal status: ACHIEVED  5.  Pt will decrease HAGOS score by at least 20 points in order demonstrate clinically significant reduction in hip pain/disability.  Baseline: 08/14/23: HAGOS Score: 37.7%, 12/11/23: 44.3%  Goal status: PARTIALLY MET  6.  Pt will decrease worst posterior R hip pain by at least 3 points on the NPRS in order to demonstrate clinically significant reduction in hip pain. Baseline: 08/14/23: worst: 8/10; 12/11/23: 2/10;  Goal status: ACHIEVED   PLAN: PT FREQUENCY: 1x/week  PT DURATION: 12 weeks  PLANNED INTERVENTIONS: Therapeutic exercises, Therapeutic activity, Neuromuscular re-education, Balance training, Gait training, Patient/Family education, Self Care, Joint mobilization, Joint manipulation, Vestibular training, Canalith repositioning, Orthotic/Fit training, DME instructions, Dry Needling, Electrical stimulation, Spinal manipulation, Spinal mobilization, Cryotherapy, Moist heat, Taping, Traction, Ultrasound, Ionotophoresis 4mg /ml Dexamethasone , Manual therapy, and Re-evaluation.  PLAN FOR NEXT SESSION: strengthening and mobility exercises focused on R hip pain, review and modify HEP as necessary  Tniya Bowditch D Win Guajardo PT, DPT, GCS  8:28 PM,02/12/24

## 2024-02-12 ENCOUNTER — Ambulatory Visit

## 2024-02-12 ENCOUNTER — Ambulatory Visit: Payer: Self-pay

## 2024-02-12 ENCOUNTER — Telehealth: Payer: Self-pay

## 2024-02-12 DIAGNOSIS — G8929 Other chronic pain: Secondary | ICD-10-CM

## 2024-02-12 DIAGNOSIS — M6281 Muscle weakness (generalized): Secondary | ICD-10-CM

## 2024-02-12 DIAGNOSIS — M25562 Pain in left knee: Secondary | ICD-10-CM | POA: Diagnosis not present

## 2024-02-12 DIAGNOSIS — M25551 Pain in right hip: Secondary | ICD-10-CM

## 2024-02-12 NOTE — Telephone Encounter (Signed)
 Please advise via MyChart if she should hold evening dose.   Reason for Disposition  [1] Follow-up call from patient regarding patient's clinical status AND [2] information urgent  Answer Assessment - Initial Assessment Questions 1. REASON FOR CALL or QUESTION: What is your reason for calling today? or How can I best     Patient accidentally took a second dose of metoprolol  and eliquis  approximately 15 minutes. Denies any symptoms including dizziness, weakness, lightheadedness. This RN requested patient check her BP. Current reading at 1000 is 133/70 p 62. Recheck at 1005 is 129/66 p 58.  This RN contacted PCP office on Clinic Access Line to request to speak to provider or her CMA or nurse and was transferred to Dr. Justus. Dr. Justus stated patient should hydrate well and monitor for symptoms. Relayed to patient  Patient agrees to call back with any drop in BP or with onset of symptoms. High priority message sent to [facility] to ask if pt should hold evening dose. Patient requests response via MyChart.  Protocols used: PCP Call - No Triage-A-AH Copied from CRM [id number]. Topic: Clinical - Red Word Triage >> Feb 12, 2024  9:47 AM Jasmin G wrote: [facility] that prompted transfer to Nurse Triage: Pt states that she doubled up on her metoprolol  tartrate (LOPRESSOR ) 50 MG tablet and Eliquis  prescription accidentally and wanted to speak to a nurse to get some advice on what to do next, no side effects so far.

## 2024-02-12 NOTE — Telephone Encounter (Signed)
 Patient informed.

## 2024-02-12 NOTE — Telephone Encounter (Signed)
 BP: 126/44, HR: 58, SpO2%: 99%  per Physical Therapy.   Patient still asymptomatic and feeling well.  CM

## 2024-02-14 NOTE — Therapy (Signed)
 OUTPATIENT PHYSICAL THERAPY ANKLE/KNEE/HIP TREATMENT  Patient Name: Brandy [name] MRN: [id number] DOB:[birth year], 68 y.o., female Today's Date: 02/19/2024  END OF SESSION:  PT End of Session - 02/19/24 0946     Visit Number 28    Number of Visits 61    Date for Recertification  03/04/24    Authorization Type eval: 06/08/23    PT Start Time 0947    PT Stop Time 1013    PT Time Calculation (min) 26 min    Activity Tolerance Patient tolerated treatment well    Behavior During Therapy [facility] for tasks assessed/performed         Past Medical History:  Diagnosis Date   Acute lower GI bleeding 01/19/2020   Arthritis    Atrial fibrillation with rapid ventricular response (HCC)    Chicken pox    Chronic systolic CHF (congestive heart failure) (HCC)    a. 03/2016 Echo: Ef 15-20%, diff HK, ant AK;  b. 05/2016 Echo: Ef 30-35%, diff HK, mildly dil LA/RA.   Diabetes mellitus without complication (HCC)    Dysrhythmia    GERD (gastroesophageal reflux disease)    Heart murmur    Hip discomfort    been going on for 40 years    History of kidney stones    Hyperlipidemia associated with type 2 diabetes mellitus (HCC) 05/12/2021   Hypertension    Moderate mitral regurgitation    a. 03/2016 Echo: mod MR in setting of LV dysfxn.   Motion sickness    car - back seat   NICM (nonischemic cardiomyopathy) (HCC)    a. 03/2016 Echo: EF 15-20%, diff HK, ant AK, mod MR, mod dil LA, mildly dil RA;  b. 04/2016 Cath: nl cors;  c. 05/2016 Echo: EF 30-35%, diff HK.   Obesity    Obstructive sleep apnea    compliant with CPAP   Persistent atrial fibrillation (HCC)    a. Dx 03/2016;  b. CHA2DS2VASc = 2-->Eliquis  5mg  BID;  b. 05/2016 Failed DCCV x 4.   Sleep apnea    Stomach irritation    Visit for monitoring Tikosyn  therapy 07/24/2016   Past Surgical History:  Procedure Laterality Date   ATRIAL FIBRILLATION ABLATION N/A 12/09/2020   Procedure: ATRIAL FIBRILLATION ABLATION;  Surgeon: Cindie Ole ONEIDA, MD;   Location: [facility];  Service: Cardiovascular;  Laterality: N/A;   BIOPSY N/A 01/12/2020   Procedure: BIOPSY;  Surgeon: Janalyn Keene NOVAK, MD;  Location: [facility];  Service: Endoscopy;  Laterality: N/A;   CARDIAC CATHETERIZATION N/A 04/17/2016   Procedure: Left Heart Cath and Coronary Angiography;  Surgeon: Deatrice DELENA Cage, MD;  Location: [facility];  Service: Cardiovascular;  Laterality: N/A;   CARDIOVERSION N/A 10/22/2020   Procedure: CARDIOVERSION;  Surgeon: Mady Bruckner, MD;  Location: [facility];  Service: Cardiovascular;  Laterality: N/A;   CARDIOVERSION N/A 01/18/2024   Procedure: CARDIOVERSION;  Surgeon: Shlomo Wilbert SAUNDERS, MD;  Location: [facility];  Service: Cardiovascular;  Laterality: N/A;   CHOLECYSTECTOMY     COLON SURGERY  2021   COLONOSCOPY N/A 01/20/2020   Procedure: COLONOSCOPY;  Surgeon: Maryruth Ole ONEIDA, MD;  Location: [facility];  Service: Endoscopy;  Laterality: N/A;   COLONOSCOPY WITH PROPOFOL  N/A 01/12/2020   Procedure: COLONOSCOPY WITH PROPOFOL ;  Surgeon: Janalyn Keene NOVAK, MD;  Location: [facility];  Service: Endoscopy;  Laterality: N/A;  Diabetic - oral meds sleep apnea   ELECTROPHYSIOLOGIC STUDY N/A 05/26/2016   Procedure: Cardioversion;  Surgeon: Deatrice  DELENA Cage, MD;  Location: [facility];  Service: Cardiovascular;  Laterality: N/A;   ESOPHAGOGASTRODUODENOSCOPY (EGD) WITH PROPOFOL  N/A 01/12/2020   Procedure: ESOPHAGOGASTRODUODENOSCOPY (EGD) WITH PROPOFOL ;  Surgeon: Janalyn Keene NOVAK, MD;  Location: [facility];  Service: Endoscopy;  Laterality: N/A;   POLYPECTOMY N/A 01/12/2020   Procedure: POLYPECTOMY;  Surgeon: Janalyn Keene NOVAK, MD;  Location: [facility];  Service: Endoscopy;  Laterality: N/A;   Patient Active Problem List   Diagnosis Date Noted   Mucous cyst of digit of left hand 05/18/2023   Peroneal tendinitis, left 10/13/2022   Left lumbar radiculopathy 07/26/2022   Arthralgia of  left knee 07/26/2022   Pes anserinus bursitis of right knee 04/19/2022   Osteoarthritis of both knees 03/27/2022   Greater trochanteric pain syndrome of both lower extremities 03/27/2022   It band syndrome, right 03/27/2022   Hyperlipidemia associated with type 2 diabetes mellitus (HCC) 05/12/2021   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   Varicose veins of leg with pain, bilateral 07/06/2020   Type II diabetes mellitus with complication (HCC) 01/19/2020   Gastric polyp    Colon polyps    Varicose veins of both lower extremities 10/20/2019   Dysphagia 10/20/2019   Osteoarthritis of right hand 10/31/2016   Nonischemic cardiomyopathy (HCC) 04/14/2016   BMI 33.0-33.9,adult 04/14/2016   Persistent atrial fibrillation (HCC) 04/12/2016   PCP: Leita Adie, MD  REFERRING PROVIDER: Selinda Ku, MD  REFERRING DIAG:  [phone] (ICD-10-CM) - Peroneal tendinitis, left  M54.16 (ICD-10-CM) - Left lumbar radiculopathy  M25.562 (ICD-10-CM) - Arthralgia of left knee  M17.11 (ICD-10-CM) - Primary osteoarthritis of right knee  M76.31 (ICD-10-CM) - It band syndrome, right  M25.551 (ICD-10-CM) - Greater 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   Rationale for Evaluation and Treatment: Rehabilitation  THERAPY DIAG: Muscle weakness (generalized)  Pain in right hip  Chronic pain of left knee  ONSET DATE: Chronic, aggravated over Christmas (December 2024)  FOLLOW-UP APPT SCHEDULED WITH REFERRING PROVIDER: No, initially no but now has appt for July;  INITIAL EVALUATION: SUBJECTIVE:                                                                                                                                                                                         SUBJECTIVE STATEMENT:  Left ankle and foot pain  PERTINENT HISTORY: Pt reports pain in left ankle that seems to have flared up over Christmas after pt reported excess walking. Pt reports feeling pulling and  burning at the base of the lateral ankle. She has previously been to PT for pain in her left hip and knee but has now noticed increased pain  in the left ankle/foot. Pt expresses difficulty with exercises; specifically pressure on the toes, stretching calves, and rocking on toes. Pt notes pain is as high as 5/10 NPS but is constant and never really goes away, with lowest pain at 1/10 NPS.  PAIN:    Pain Intensity: Present: 1/10, Best: 1/10, Worst: 5/10 Pain location: Lateral foot and ankle Pain Quality: constant and burning  Radiating: Yes  Swelling: No  Popping, catching, locking: No  Numbness/Tingling: No Focal Weakness: Yes Aggravating factors: Touching lateral ankle, moving the ankle, ascending steps when leading with left foot, walking Relieving factors: Stopping activity, resting, brace temporarily relieves some pain 24-hour pain behavior: No night pain when sleeping on back, stretching in AM helps but feels unstable, PM pain depends on activity during the day History of prior back, hip, knee, or ankle injury, pain, surgery, or therapy: Yes; chronic hip, knee, foot pain Dominant hand: right Imaging: No  Typical footwear: Sneakers or rubber boots Red flags: Negative for personal history of cancer, chills/fever, night sweats, nausea, vomiting, unrelenting pain): Negative  PRECAUTIONS: None  WEIGHT BEARING RESTRICTIONS: No  FALLS: Has patient fallen in last 6 months? 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  OBJECTIVE:   Patient Surveys  08/14/23: FADI: FADI Activities Score: 80 / 104 or 77%  Cognition Patient is oriented to person, place, and time.  Recent memory is intact.  Remote memory is intact.  Attention span and concentration are intact.  Expressive speech is intact.  Patient's fund of knowledge is within normal limits for educational level.    Gross Musculoskeletal Assessment Bulk: Normal Tone: Normal No trophic changes noted to foot/ankle. No ecchymosis, erythema, or edema noted. No gross ankle/foot deformity noted  GAIT: Comments: Mild toe out gait on right foot. Antalgic gait noted on left side.  Posture: No noted discrepancies.  AROM AROM (Normal range in degrees) AROM       Hip Right Left  Flexion (125) WNL WNL  Extension (15) WNL WNL  Abduction (40) WNL WNL  Adduction  WNL WNL  Internal Rotation (45) WNL WNL  External Rotation (45) WNL WNL      Knee    Flexion (135) WNL WNL  Extension (0) WNL WNL      Ankle    Dorsiflexion (20) 10 (WB) 10 (WB)  Plantarflexion (50) 40 35  Inversion (35) 30 20  Eversion (15) 7 8  (* = pain; Blank rows = not tested)  LE MMT: MMT (out of 5) Right  Left   Hip flexion 4 4  Hip extension 4 4  Hip abduction 4 4+  Hip adduction 4+ 4  Hip internal rotation 5 5  Hip external rotation 5 5  Knee flexion 4 4  Knee extension 5 5  Ankle dorsiflexion 5 5  Ankle plantarflexion    Ankle inversion 5 5*  Ankle eversion 5 5*  (* = pain; Blank rows = not tested)  Sensation Diminished sensation left S1 and lateral aspect  of left foot.  Reflexes Deferred  Palpation Location LEFT  RIGHT           Plantar fascia 1 0  Dorsal aspect of foot 1 0  Tarsal tunnel 1 0  (Blank rows = not tested) Graded on 0-4 scale (0 = no pain, 1 = pain, 2 = pain with wincing/grimacing/flinching, 3 = pain with withdrawal, 4 = unwilling to allow  palpation), (Blank rows = not tested)  Passive Accessory Motion Superior Tibiofibular Joint: WNL Inferior Tibiofibular Joint: WNL Talocrural Joint Distraction: WNL but noted pain with pulling feeling with left foot Talocrural Joint AP: L hypomobile, painful where hand is purchased Talocrural Joint PA: L hypomobile  SPECIAL TESTS Ligamentous Integrity Anterior Drawer (ATF, 10-15 plantarflexion with anterior translation): Negative Talar Tilt (CFL, inversion): Negative    TODAY'S TREATMENT: 02/19/2024    Subjective: Reports ongoing L knee pain. She arrives wearing a L knee brace and states that her knee pain has been really bad lately when walking. No specific questions upon arrival.   PAIN: 4/10 L knee pain    Ther-ex  Supine SLR with 3# AW 2 x 10 BLE; Hooklying bridge 2 x 10 BLE; Hooklying clams with manual resistance 2 x 10 BLE; Hooklying adductor squeeze with manual resistance 2 x 10 BLE; Sidelying reverse clam with manual resistance 2 x 10 BLE; Sidelying hip abduction with manual resistance 2 x 10 BLE; SAQ over bolster with manual resistance 2 x 10  BLE; Heel slides with resisted extension 2 x 10 BLE;   Not today:  Ankle pumps 2 x 30s BLE; Seated ankle circumduction 2 x 30s each direction BLE; Seated resisted PF with green tband x 20 BLE; Seated resisted inversion with green tband x 20 BLE; Seated resisted eversion with green tband x 20 BLE; Supine isometric DF 3 x 20s BLE; Supine isometric PF 3 x 20s BLE; Ankle DF stretch 3 x 30s BLE; Prone L knee hamstring curls with manual resistance from therapist 2 x 10; Prone LLE straight knee hip extension 2 x 10; Prone L hip ER with knee at 90 flexion and manual resistance from therapist 2 x 10; Pball bridges with arms in front of chest 2 x 10; Standing hip strengthening with 4# ankle weights: Hip flexion marches x 15 BLE; HS curls x 15 BLE; Total Gym L22 double leg partial squats 2 x 15; TG L22 double leg heel raises 2  x 15; Supine straight leg hip abduction/adduction with manual resistance 2 x 10 BLE;   PATIENT EDUCATION:  Education details: Pt educated throughout session about proper posture and technique with exercises. Improved exercise technique, movement at target joints, use of target muscles after min to mod verbal, visual, tactile cues.  Person educated: Patient Education method: Explanation, Actor cues, Verbal cues, and Handouts Education comprehension: verbalized understanding and returned demonstration   HOME EXERCISE PROGRAM:  Access Code: [email or web address] URL: [email or web address] Date: 12/11/2023 Prepared by: Selinda Eck  Exercises - Seated Ankle Eversion with Resistance  - 1 x daily - 7 x weekly - 2 sets - 10 reps - 3s hold - Seated Ankle Inversion with Resistance and Legs Crossed (Mirrored)  - 1 x daily - 7 x weekly - 2 sets - 10 reps - 3s hold - Seated Ankle Plantarflexion with Resistance  - 1 x daily - 7 x weekly - 2 sets - 10 reps - 3s hold - Long Sitting Calf Stretch with Strap  - 1 x daily - 7 x weekly - 3  reps - 30-45s hold - Standing Gastroc Stretch on Step  - 1 x daily - 7 x weekly - 3 sets - 10 reps - Standing Dorsiflexion Self-Mobilization on Step  - 1 x daily - 7 x weekly - 3 sets - 10 reps - Standing Dorsiflexion Self-Mobilization on Step (Mirrored)  - 1 x daily - 7 x weekly - 3 sets - 10 reps - Hooklying Isometric Hip Abduction with Belt  - 1 x daily - 7 x weekly - 2 sets - 5 reps - 30s hold/30s relax hold   ASSESSMENT:  CLINICAL IMPRESSION: Due to time constraints and irritable L knee, focused on non WB positions during session today. Continued mat table strengthening for bilateral hip and LE musculature. She is able to tolerate resisted SAQ today without significant increase in pain. No HEP updates at this time. Pt encouraged to follow-up as scheduled. She will benefit from PT services to address deficits in strength and pain in order to improve pain-free  function at home and with leisure activities.   OBJECTIVE IMPAIRMENTS: decreased strength and hypomobility.   ACTIVITY LIMITATIONS: stairs and locomotion level  PARTICIPATION LIMITATIONS: occupation and yard work  PERSONAL FACTORS: Age, Behavior pattern, Past/current experiences, Time since onset of injury/illness/exacerbation, and 1-2 comorbidities: type II diabetes and left lumbar radiculopathy are also affecting patient's functional outcome.   REHAB POTENTIAL: Good  CLINICAL DECISION MAKING: Evolving/moderate complexity  EVALUATION COMPLEXITY: Moderate   GOALS: Goals reviewed with patient? Yes  SHORT TERM GOALS:   Pt will be independent with HEP to improve strength and decrease ankle pain to improve pain-free function at home and work. Baseline:  Goal status: ACHIEVED   LONG TERM GOALS: Target date: 03/04/2024   1.  Pt will decrease worst ankle pain by at least 3 points on the NPRS in order to demonstrate clinically significant reduction in ankle pain. Baseline: 06/08/23: 1/10 best, 5/10 worst; 08/14/23: worst: 1/10; Goal status: ACHIEVED  2.  Pt will increase FADI score by at least 4 points in order demonstrate clinically significant reduction in ankle pain/disability.       Baseline: 06/13/23: 65%; 08/14/23: 80 / 104 or 77% Goal status: ACHIEVED  3.  Pt will improve strength of ankle inversion/eversion with limited to no pain in order to demonstrate improvement in strength and function.  Baseline: 06/08/23: 5/5 with pain; 08/14/23: 5/5 without pain Goal status: ACHIEVED  4.  Pt will report at least 50% improvement in L ankle and knee symptoms in order to demonstrate improvement in her ability to work in the yard/garden with less pain.  Baseline: 08/14/23: 75% improvement Goal status: ACHIEVED  5.  Pt will decrease HAGOS score by at least 20 points in order demonstrate clinically significant reduction in hip pain/disability.  Baseline: 08/14/23: HAGOS Score: 37.7%, 12/11/23: 44.3%   Goal status: PARTIALLY MET  6.  Pt will decrease worst posterior R hip pain by at least 3 points on the NPRS in order to demonstrate clinically significant reduction in hip pain. Baseline: 08/14/23: worst: 8/10; 12/11/23: 2/10; Goal status: ACHIEVED   PLAN: PT FREQUENCY: 1x/week  PT DURATION: 12 weeks  PLANNED INTERVENTIONS: Therapeutic exercises, Therapeutic activity, Neuromuscular re-education, Balance training, Gait training, Patient/Family education, Self Care, Joint mobilization, Joint manipulation, Vestibular training, Canalith repositioning, Orthotic/Fit training, DME instructions, Dry Needling, Electrical stimulation, Spinal manipulation, Spinal mobilization, Cryotherapy, Moist heat, Taping, Traction, Ultrasound, Ionotophoresis 4mg /ml Dexamethasone , Manual therapy, and Re-evaluation.  PLAN FOR NEXT SESSION: strengthening and mobility exercises focused on R hip pain, review  and modify HEP as necessary  Amiya Escamilla D Miasia Crabtree PT, DPT, GCS  2:27 PM,02/19/24

## 2024-02-15 ENCOUNTER — Other Ambulatory Visit (HOSPITAL_COMMUNITY): Payer: Self-pay

## 2024-02-15 ENCOUNTER — Other Ambulatory Visit: Payer: Self-pay

## 2024-02-18 NOTE — Progress Notes (Unsigned)
 Cardiology Office Note Date:  02/18/2024  Patient ID:  Brandy [name], Brandy [name] 01/22/1956, MRN [id number] PCP:  Brandy Leita DEL, [name]  Cardiologist/Electrophysiologist: Dr. Fernande AFib ablation: Dr. Cindie 2022    Chief Complaint:   discuss OAC  History of Present Illness: Brandy [name] is a 68 y.o. female with history of  NICM with improvement in EF with rate control AFib,  DM, OSA w/CPAP.  She saw Dr. Fernande 02/2023 Reported watch HRs 150's while working in the yard Had ILR to monitor burden > guide OAC Discussed increased DOE > perhaps HFpEF> started Farxiga  Described pause identified on 9/25 occurred in the context of pain and vomiting with a kidney stone; still struggling with urinary urgency.  BP not longer an issue (historically hypotension)  Advised to come in to discuss a/c recommendations with ILR alert for AFib alert  NOTE; 2022  BOTH of her LGIB occurred s/p polyp removal after restart of her a/c. The 1st about 4-5 days after the procedure and a couple days after restart of her eliquis  The 2nd almost 2 weeks after the procedure and a week or so after restart of Eliquis , though (?was a large polyp and she says the GI [name] did mention that bringing the edges together was incomplete given the size) And she was weary to resume OAC then   I saw her 12/19/23 She does have a sense of a bit of unease perhaps In her chest  No CP, no overt palpitations She does feel like when in AFib she gets winded easier, more tired. No near syncope or syncope Eliquis  started Lopressor  increased Planned for DCCV once adequately anticoagulated  DCCV 01/18/24 > successful   TODAY  *** eliquis , dose, bleeding, labs *** burden, rates *** new EP [name] >> had discuss Brandy [name] >> Brandy [name]  AFib Historically had been described as permanent  Did undergo ultimately AFib ablation 12/09/2020 (Dr. Cindie)  Device information: MDT ILR implanted 02/09/22 (AFib surveillance)   Past Medical History:   Diagnosis Date   Acute lower GI bleeding 01/19/2020   Arthritis    Atrial fibrillation with rapid ventricular response (HCC)    Chicken pox    Chronic systolic CHF (congestive heart failure) (HCC)    a. 03/2016 Echo: Ef 15-20%, diff HK, ant AK;  b. 05/2016 Echo: Ef 30-35%, diff HK, mildly dil LA/RA.   Diabetes mellitus without complication (HCC)    Dysrhythmia    GERD (gastroesophageal reflux disease)    Heart murmur    Hip discomfort    been going on for 40 years    History of kidney stones    Hyperlipidemia associated with type 2 diabetes mellitus (HCC) 05/12/2021   Hypertension    Moderate mitral regurgitation    a. 03/2016 Echo: mod MR in setting of LV dysfxn.   Motion sickness    car - back seat   NICM (nonischemic cardiomyopathy) (HCC)    a. 03/2016 Echo: EF 15-20%, diff HK, ant AK, mod MR, mod dil LA, mildly dil RA;  b. 04/2016 Cath: nl cors;  c. 05/2016 Echo: EF 30-35%, diff HK.   Obesity    Obstructive sleep apnea    compliant with CPAP   Persistent atrial fibrillation (HCC)    a. Dx 03/2016;  b. CHA2DS2VASc = 2-->Eliquis  5mg  BID;  b. 05/2016 Failed DCCV x 4.   Sleep apnea    Stomach irritation    Visit for monitoring Tikosyn  therapy 07/24/2016    Past Surgical History:  Procedure Laterality Date   ATRIAL FIBRILLATION ABLATION N/A 12/09/2020   Procedure: ATRIAL FIBRILLATION ABLATION;  Surgeon: Brandy [name] Brandy DASEN, [name];  Location: [facility];  Service: Cardiovascular;  Laterality: N/A;   BIOPSY N/A 01/12/2020   Procedure: BIOPSY;  Surgeon: Brandy Keene NOVAK, [name];  Location: [facility];  Service: Endoscopy;  Laterality: N/A;   CARDIAC CATHETERIZATION N/A 04/17/2016   Procedure: Left Heart Cath and Coronary Angiography;  Surgeon: Brandy [name];  Location: [facility];  Service: Cardiovascular;  Laterality: N/A;   CARDIOVERSION N/A 10/22/2020   Procedure: CARDIOVERSION;  Surgeon: Brandy Bruckner, [name];  Location: [facility];  Service:  Cardiovascular;  Laterality: N/A;   CARDIOVERSION N/A 01/18/2024   Procedure: CARDIOVERSION;  Surgeon: Brandy Wilbert SAUNDERS, [name];  Location: [facility];  Service: Cardiovascular;  Laterality: N/A;   CHOLECYSTECTOMY     COLON SURGERY  2021   COLONOSCOPY N/A 01/20/2020   Procedure: COLONOSCOPY;  Surgeon: Maryruth Brandy DASEN, [name];  Location: [facility];  Service: Endoscopy;  Laterality: N/A;   COLONOSCOPY WITH PROPOFOL  N/A 01/12/2020   Procedure: COLONOSCOPY WITH PROPOFOL ;  Surgeon: Brandy Keene NOVAK, [name];  Location: [facility];  Service: Endoscopy;  Laterality: N/A;  Diabetic - oral meds sleep apnea   ELECTROPHYSIOLOGIC STUDY N/A 05/26/2016   Procedure: Cardioversion;  Surgeon: Brandy [name];  Location: [facility];  Service: Cardiovascular;  Laterality: N/A;   ESOPHAGOGASTRODUODENOSCOPY (EGD) WITH PROPOFOL  N/A 01/12/2020   Procedure: ESOPHAGOGASTRODUODENOSCOPY (EGD) WITH PROPOFOL ;  Surgeon: Brandy Keene NOVAK, [name];  Location: [facility];  Service: Endoscopy;  Laterality: N/A;   POLYPECTOMY N/A 01/12/2020   Procedure: POLYPECTOMY;  Surgeon: Brandy Keene NOVAK, [name];  Location: [facility];  Service: Endoscopy;  Laterality: N/A;    Current Outpatient Medications  Medication Sig Dispense Refill   apixaban  (ELIQUIS ) 5 MG TABS tablet Take 1 tablet (5 mg total) by mouth 2 (two) times daily. 60 tablet 5   Cholecalciferol  (VITAMIN D3) 1000 units CAPS Take 1,000 Units by mouth 2 (two) times daily.     dapagliflozin  propanediol (FARXIGA ) 10 MG TABS tablet TAKE 1 TABLET DAILY BEFORE BREAKFAST 90 tablet 0   furosemide  (LASIX ) 20 MG tablet Take 1 tablet (20 mg total) by mouth daily as needed for fluid or edema. 30 tablet 0   glucose blood (ONETOUCH VERIO) test strip USE 1 STRIP TO CHECK GLUCOSE 4 TIMES DAILY AS  DIRECTED 400 each 0   Lancets (ONETOUCH DELICA PLUS LANCET33G) MISC USE 1  TO CHECK GLUCOSE 4 TIMES DAILY AS DIRECTED 400 each 0   Magnesium Bisglycinate (MAG  GLYCINATE) 100 MG TABS Take 240 mg by mouth 2 (two) times daily.     metFORMIN  (GLUCOPHAGE -XR) 500 MG 24 hr tablet Take 1/2 (one-half) tablet by mouth twice daily 90 tablet 3   metoprolol  tartrate (LOPRESSOR ) 50 MG tablet Take 1 tablet (50 mg total) by mouth 2 (two) times daily. 180 tablet 1   Multiple Vitamins-Minerals (MULTIVITAMIN WITH MINERALS) tablet Take 1 tablet by mouth daily.     OVER THE COUNTER MEDICATION Apply 1 Application topically daily as needed. Frankicense     peppermint oil liquid Apply 1 application topically daily as needed (itchy/ cold symptoms).     vitamin B-12 (CYANOCOBALAMIN) 500 MCG tablet Take 500 mcg by mouth daily.     vitamin C (ASCORBIC ACID) 500 MG tablet Take 500 mg by mouth daily.      No current facility-administered medications for this visit.  Allergies:   Patient has no known allergies.   Social History:  The patient  reports that she has never smoked. She has never been exposed to tobacco smoke. She has never used smokeless tobacco. She reports that she does not drink alcohol and does not use drugs.   Family History:  The patient's family history includes Anxiety disorder in her mother; Arthritis in her father and mother; Asthma in her mother; COPD in her father and mother; Cancer in her maternal aunt and maternal aunt; Depression in her mother; Diabetes in her paternal aunt; Heart disease in her father; Hypertension in her father; Varicose Veins in her father.  ROS:  Please see the history of present illness.    All other systems are reviewed and otherwise negative.   PHYSICAL EXAM:  VS:  There were no vitals taken for this visit. BMI: There is no height or weight on file to calculate BMI. Well nourished, well developed, in no acute distress HEENT: normocephalic, atraumatic Neck: no JVD, carotid bruits or masses Cardiac: ***; no significant murmurs, no rubs, or gallops Lungs: *** CTA b/l, no wheezing, rhonchi or rales Abd: soft, nontender MS: no  deformity or atrophy Ext: *** no edema Skin: warm and dry, no rash Neuro:  No gross deficits appreciated Psych: euthymic mood, full affect   EKG: not done today  Device interrogation: done today and reviewed by myself Carelink from today is reviewed *** battery is good *** % AFib burden *** histogram   10/28/21: TTE 1. Left ventricular ejection fraction, by estimation, is 55 to 60%. The  left ventricle has normal function. The left ventricle has no regional  wall motion abnormalities. Left ventricular diastolic parameters were  normal. The average left ventricular  global longitudinal strain is -15.0 %.   2. Right ventricular systolic function is normal. The right ventricular  size is normal. There is normal pulmonary artery systolic pressure. The  estimated right ventricular systolic pressure is 26.5 mmHg.   3. Left atrial size was borderline mildly dilated.   4. The mitral valve is normal in structure. Mild mitral valve  regurgitation. No evidence of mitral stenosis.   5. The aortic valve is tricuspid. Aortic valve regurgitation is not  visualized. No aortic stenosis is present.   6. The inferior vena cava is normal in size with greater than 50%  respiratory variability, suggesting right atrial pressure of 3 mmHg.    12/09/2020: EPS/ablation CONCLUSIONS: 1. Successful PVI 2. Successful ablation/isolation of the posterior wall 3. Successful ablation of complex fractional atrial electrograms outside the LAA and LSPV 4. Intracardiac echo reveals trivial pericardial effusion, 4 pulmonary veins, normal LV function 5. No early apparent complications. 6. Colchicine  0.6mg  PO BID x 5 days 7. Protonix  40mg  PO daily for 45 days   12/31/2018; TTE IMPRESSIONS   1. The left ventricle has normal systolic function with an ejection  fraction of 60-65%. The cavity size was normal. There is mildly increased  left ventricular wall thickness. Left ventricular diastolic function could  not  be evaluated secondary to atrial   fibrillation. No evidence of left ventricular regional wall motion  abnormalities.   2. The right ventricle has normal systolic function. The cavity was  normal. There is no increase in right ventricular wall thickness. Right  ventricular systolic pressure is upper normal to mildly elevated (25-30  mmHg plus central venous pressure).   3. Right atrial size was mildly dilated.   4. The aortic valve is tricuspid.   5.  The aorta is normal unless otherwise noted.   6. The interatrial septum was not well visualized.     04/17/2016; LHC LV end diastolic pressure is normal.   1. Normal coronary arteries. 2. Severely dilated left ventricle with Normal left ventricular end-diastolic pressure. Left ventricular angiography was not performed. EF was severely dilated by echo.   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 Continue medical therapy. I am going to start anticoagulation with Eliquis  this evening. If ventricular rate is controlled, the patient can be discharged home with rate control and anticoagulation with plans for outpatient cardioversion in 3 weeks. Otherwise, if the rate is not controlled, recommend TEE guided cardioversion tomorrow.    Recent Labs: 05/18/2023: ALT 22; TSH 2.260 12/19/2023: BUN 21; Creatinine, Ser 0.93; Hemoglobin 16.9; Platelets 413; Potassium 5.1; Sodium 139  05/18/2023: Chol/HDL Ratio 4.4; Cholesterol, Total 191; HDL 43; LDL Chol Calc (NIH) 120; Triglycerides 157   CrCl cannot be calculated (Patient's most recent lab result is older than the maximum 21 days allowed.).   Wt Readings from Last 3 Encounters:  01/25/24 236 lb (107 kg)  12/19/23 225 lb (102.1 kg)  12/12/23 227 lb (103 kg)     Other studies reviewed: Additional studies/records reviewed today include: summarized above  ASSESSMENT AND PLAN:  1. Longstanding Persistent AFib     CHA2DS2Vasc is 8  (age, DM, NICM (hx) and gender), on Eliquis , *** appropriately dosed *** burden     2. NICM     Felt tachy-mediated     Normalization of her EF by last echo 2023  3. Hypotension     Historically has been on midodrine  for low BPs     Does not seem an ongoing issue                 Disposition: back in *** mo, sooner if needed.     Current medicines are reviewed at length with the patient today.  The patient did not have any concerns regarding medicines.  Bonney Charlies Arthur, PA-C 02/18/2024 8:10 AM     [facility] [address] [place] [address] [phone] (office)  [phone] (fax)

## 2024-02-19 ENCOUNTER — Ambulatory Visit: Attending: Family Medicine

## 2024-02-19 DIAGNOSIS — M6281 Muscle weakness (generalized): Secondary | ICD-10-CM | POA: Diagnosis not present

## 2024-02-19 DIAGNOSIS — M25562 Pain in left knee: Secondary | ICD-10-CM | POA: Insufficient documentation

## 2024-02-19 DIAGNOSIS — G8929 Other chronic pain: Secondary | ICD-10-CM | POA: Diagnosis not present

## 2024-02-19 DIAGNOSIS — M25551 Pain in right hip: Secondary | ICD-10-CM | POA: Diagnosis not present

## 2024-02-20 ENCOUNTER — Encounter: Payer: Self-pay | Admitting: Physician Assistant

## 2024-02-20 ENCOUNTER — Ambulatory Visit: Attending: Cardiology | Admitting: Physician Assistant

## 2024-02-20 VITALS — BP 114/72 | HR 72 | Ht 67.0 in | Wt 238.2 lb

## 2024-02-20 DIAGNOSIS — Z95818 Presence of other cardiac implants and grafts: Secondary | ICD-10-CM

## 2024-02-20 DIAGNOSIS — D6869 Other thrombophilia: Secondary | ICD-10-CM | POA: Diagnosis not present

## 2024-02-20 DIAGNOSIS — I4819 Other persistent atrial fibrillation: Secondary | ICD-10-CM | POA: Diagnosis not present

## 2024-02-20 DIAGNOSIS — I428 Other cardiomyopathies: Secondary | ICD-10-CM | POA: Diagnosis not present

## 2024-02-20 MED ORDER — METOPROLOL TARTRATE 25 MG PO TABS: 25.0000 mg | ORAL_TABLET | Freq: Two times a day (BID) | ORAL | 2 refills | Status: DC

## 2024-02-20 NOTE — Patient Instructions (Signed)
 Medication Instructions:   START TAKING :  METOPROLOL  (LOPRESSOR )   25 MG TWICE A DAY    *If you need a refill on your cardiac medications before your next appointment, please call your pharmacy*    Lab Work: NONE ORDERED  TODAY     If you have labs (blood work) drawn today and your tests are completely normal, you will receive your results only by: MyChart Message (if you have MyChart) OR A paper copy in the mail If you have any lab test that is abnormal or we need to change your treatment, we will call you to review the results.   Testing/Procedures:  NONE ORDERED  TODAY     Follow-Up: At [facility], you and your health needs are our priority.  As part of our continuing mission to provide you with exceptional heart care, our providers are all part of one team.  This team includes your primary Cardiologist (physician) and Advanced Practice Providers or APPs (Physician Assistants and Nurse Practitioners) who all work together to provide you with the care you need, when you need it.  Your next appointment:   2 month(s)Provider:    Donnice Primus, MD    We recommend signing up for the patient portal called MyChart.  Sign up information is provided on this After Visit Summary.  MyChart is used to connect with patients for Virtual Visits (Telemedicine).  Patients are able to view lab/test results, encounter notes, upcoming appointments, etc.  Non-urgent messages can be sent to your provider as well.   To learn more about what you can do with MyChart, go to [email or web address].   Other Instructions

## 2024-02-21 ENCOUNTER — Encounter

## 2024-02-22 ENCOUNTER — Other Ambulatory Visit: Payer: Self-pay | Admitting: Internal Medicine

## 2024-02-22 ENCOUNTER — Ambulatory Visit

## 2024-02-22 DIAGNOSIS — E118 Type 2 diabetes mellitus with unspecified complications: Secondary | ICD-10-CM

## 2024-02-22 DIAGNOSIS — I428 Other cardiomyopathies: Secondary | ICD-10-CM | POA: Diagnosis not present

## 2024-02-22 LAB — CUP PACEART REMOTE DEVICE CHECK
Date Time Interrogation Session: 20251009232124
Implantable Pulse Generator Implant Date: 20230928

## 2024-02-22 NOTE — Therapy (Signed)
 OUTPATIENT PHYSICAL THERAPY ANKLE/KNEE/HIP TREATMENT  Patient Name: Brandy [name] MRN: [id number] DOB:Feb 08, 1956, 68 y.o., female Today's Date: 02/26/2024  END OF SESSION:  PT End of Session - 02/26/24 0856     Visit Number 29    Number of Visits 61    Date for Recertification  03/04/24    Authorization Type eval: 06/08/23    PT Start Time 0847    PT Stop Time 0930    PT Time Calculation (min) 43 min    Activity Tolerance Patient tolerated treatment well    Behavior During Therapy [facility] for tasks assessed/performed         Past Medical History:  Diagnosis Date   Acute lower GI bleeding 01/19/2020   Arthritis    Atrial fibrillation with rapid ventricular response (HCC)    Chicken pox    Chronic systolic CHF (congestive heart failure) (HCC)    a. 03/2016 Echo: Ef 15-20%, diff HK, ant AK;  b. 05/2016 Echo: Ef 30-35%, diff HK, mildly dil LA/RA.   Diabetes mellitus without complication (HCC)    Dysrhythmia    GERD (gastroesophageal reflux disease)    Heart murmur    Hip discomfort    been going on for 40 years    History of kidney stones    Hyperlipidemia associated with type 2 diabetes mellitus (HCC) 05/12/2021   Hypertension    Moderate mitral regurgitation    a. 03/2016 Echo: mod MR in setting of LV dysfxn.   Motion sickness    car - back seat   NICM (nonischemic cardiomyopathy) (HCC)    a. 03/2016 Echo: EF 15-20%, diff HK, ant AK, mod MR, mod dil LA, mildly dil RA;  b. 04/2016 Cath: nl cors;  c. 05/2016 Echo: EF 30-35%, diff HK.   Obesity    Obstructive sleep apnea    compliant with CPAP   Persistent atrial fibrillation (HCC)    a. Dx 03/2016;  b. CHA2DS2VASc = 2-->Eliquis  5mg  BID;  b. 05/2016 Failed DCCV x 4.   Sleep apnea    Stomach irritation    Visit for monitoring Tikosyn  therapy 07/24/2016   Past Surgical History:  Procedure Laterality Date   ATRIAL FIBRILLATION ABLATION N/A 12/09/2020   Procedure: ATRIAL FIBRILLATION ABLATION;  Surgeon: Cindie Ole ONEIDA, MD;   Location: [facility];  Service: Cardiovascular;  Laterality: N/A;   BIOPSY N/A 01/12/2020   Procedure: BIOPSY;  Surgeon: Janalyn Keene NOVAK, MD;  Location: [facility];  Service: Endoscopy;  Laterality: N/A;   CARDIAC CATHETERIZATION N/A 04/17/2016   Procedure: Left Heart Cath and Coronary Angiography;  Surgeon: Deatrice DELENA Cage, MD;  Location: [facility] INVASIVE CV LAB;  Service: Cardiovascular;  Laterality: N/A;   CARDIOVERSION N/A 10/22/2020   Procedure: CARDIOVERSION;  Surgeon: Mady Bruckner, MD;  Location: [facility] ORS;  Service: Cardiovascular;  Laterality: N/A;   CARDIOVERSION N/A 01/18/2024   Procedure: CARDIOVERSION;  Surgeon: Shlomo Wilbert SAUNDERS, MD;  Location: [facility];  Service: Cardiovascular;  Laterality: N/A;   CHOLECYSTECTOMY     COLON SURGERY  2021   COLONOSCOPY N/A 01/20/2020   Procedure: COLONOSCOPY;  Surgeon: Maryruth Ole ONEIDA, MD;  Location: [facility] ENDOSCOPY;  Service: Endoscopy;  Laterality: N/A;   COLONOSCOPY WITH PROPOFOL  N/A 01/12/2020   Procedure: COLONOSCOPY WITH PROPOFOL ;  Surgeon: Janalyn Keene NOVAK, MD;  Location: [facility];  Service: Endoscopy;  Laterality: N/A;  Diabetic - oral meds sleep apnea   ELECTROPHYSIOLOGIC STUDY N/A 05/26/2016   Procedure: Cardioversion;  Surgeon: Deatrice  DELENA Cage, MD;  Location: [facility] ORS;  Service: Cardiovascular;  Laterality: N/A;   ESOPHAGOGASTRODUODENOSCOPY (EGD) WITH PROPOFOL  N/A 01/12/2020   Procedure: ESOPHAGOGASTRODUODENOSCOPY (EGD) WITH PROPOFOL ;  Surgeon: Janalyn Keene NOVAK, MD;  Location: [facility];  Service: Endoscopy;  Laterality: N/A;   POLYPECTOMY N/A 01/12/2020   Procedure: POLYPECTOMY;  Surgeon: Janalyn Keene NOVAK, MD;  Location: [facility];  Service: Endoscopy;  Laterality: N/A;   Patient Active Problem List   Diagnosis Date Noted   Mucous cyst of digit of left hand 05/18/2023   Peroneal tendinitis, left 10/13/2022   Left lumbar radiculopathy 07/26/2022   Arthralgia of  left knee 07/26/2022   Pes anserinus bursitis of right knee 04/19/2022   Osteoarthritis of both knees 03/27/2022   Greater trochanteric pain syndrome of both lower extremities 03/27/2022   It band syndrome, right 03/27/2022   Hyperlipidemia associated with type 2 diabetes mellitus (HCC) 05/12/2021   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   Varicose veins of leg with pain, bilateral 07/06/2020   Type II diabetes mellitus with complication (HCC) 01/19/2020   Gastric polyp    Colon polyps    Varicose veins of both lower extremities 10/20/2019   Dysphagia 10/20/2019   Osteoarthritis of right hand 10/31/2016   Nonischemic cardiomyopathy (HCC) 04/14/2016   BMI 33.0-33.9,adult 04/14/2016   Persistent atrial fibrillation (HCC) 04/12/2016   PCP: Leita Adie, MD  REFERRING PROVIDER: Selinda Ku, MD  REFERRING DIAG:  [phone] (ICD-10-CM) - Peroneal tendinitis, left  M54.16 (ICD-10-CM) - Left lumbar radiculopathy  M25.562 (ICD-10-CM) - Arthralgia of left knee  M17.11 (ICD-10-CM) - Primary osteoarthritis of right knee  M76.31 (ICD-10-CM) - It band syndrome, right  M25.551 (ICD-10-CM) - Greater 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   Rationale for Evaluation and Treatment: Rehabilitation  THERAPY DIAG: Muscle weakness (generalized)  Pain in right hip  Chronic pain of left knee  ONSET DATE: Chronic, aggravated over Christmas (December 2024)  FOLLOW-UP APPT SCHEDULED WITH REFERRING PROVIDER: No, initially no but now has appt for July;  INITIAL EVALUATION: SUBJECTIVE:                                                                                                                                                                                         SUBJECTIVE STATEMENT:  Left ankle and foot pain  PERTINENT HISTORY: Pt reports pain in left ankle that seems to have flared up over Christmas after pt reported excess walking. Pt reports feeling pulling and  burning at the base of the lateral ankle. She has previously been to PT for pain in her left hip and knee but has now noticed increased pain  in the left ankle/foot. Pt expresses difficulty with exercises; specifically pressure on the toes, stretching calves, and rocking on toes. Pt notes pain is as high as 5/10 NPS but is constant and never really goes away, with lowest pain at 1/10 NPS.  PAIN:    Pain Intensity: Present: 1/10, Best: 1/10, Worst: 5/10 Pain location: Lateral foot and ankle Pain Quality: constant and burning  Radiating: Yes  Swelling: No  Popping, catching, locking: No  Numbness/Tingling: No Focal Weakness: Yes Aggravating factors: Touching lateral ankle, moving the ankle, ascending steps when leading with left foot, walking Relieving factors: Stopping activity, resting, brace temporarily relieves some pain 24-hour pain behavior: No night pain when sleeping on back, stretching in AM helps but feels unstable, PM pain depends on activity during the day History of prior back, hip, knee, or ankle injury, pain, surgery, or therapy: Yes; chronic hip, knee, foot pain Dominant hand: right Imaging: No  Typical footwear: Sneakers or rubber boots Red flags: Negative for personal history of cancer, chills/fever, night sweats, nausea, vomiting, unrelenting pain): Negative  PRECAUTIONS: None  WEIGHT BEARING RESTRICTIONS: No  FALLS: Has patient fallen in last 6 months? 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  OBJECTIVE:   Patient Surveys  08/14/23: FADI: FADI Activities Score: 80 / 104 or 77%  Cognition Patient is oriented to person, place, and time.  Recent memory is intact.  Remote memory is intact.  Attention span and concentration are intact.  Expressive speech is intact.  Patient's fund of knowledge is within normal limits for educational level.    Gross Musculoskeletal Assessment Bulk: Normal Tone: Normal No trophic changes noted to foot/ankle. No ecchymosis, erythema, or edema noted. No gross ankle/foot deformity noted  GAIT: Comments: Mild toe out gait on right foot. Antalgic gait noted on left side.  Posture: No noted discrepancies.  AROM AROM (Normal range in degrees) AROM       Hip Right Left  Flexion (125) WNL WNL  Extension (15) WNL WNL  Abduction (40) WNL WNL  Adduction  WNL WNL  Internal Rotation (45) WNL WNL  External Rotation (45) WNL WNL      Knee    Flexion (135) WNL WNL  Extension (0) WNL WNL      Ankle    Dorsiflexion (20) 10 (WB) 10 (WB)  Plantarflexion (50) 40 35  Inversion (35) 30 20  Eversion (15) 7 8  (* = pain; Blank rows = not tested)  LE MMT: MMT (out of 5) Right  Left   Hip flexion 4 4  Hip extension 4 4  Hip abduction 4 4+  Hip adduction 4+ 4  Hip internal rotation 5 5  Hip external rotation 5 5  Knee flexion 4 4  Knee extension 5 5  Ankle dorsiflexion 5 5  Ankle plantarflexion    Ankle inversion 5 5*  Ankle eversion 5 5*  (* = pain; Blank rows = not tested)  Sensation Diminished sensation left S1 and lateral aspect  of left foot.  Reflexes Deferred  Palpation Location LEFT  RIGHT           Plantar fascia 1 0  Dorsal aspect of foot 1 0  Tarsal tunnel 1 0  (Blank rows = not tested) Graded on 0-4 scale (0 = no pain, 1 = pain, 2 = pain with wincing/grimacing/flinching, 3 = pain with withdrawal, 4 = unwilling to allow  palpation), (Blank rows = not tested)  Passive Accessory Motion Superior Tibiofibular Joint: WNL Inferior Tibiofibular Joint: WNL Talocrural Joint Distraction: WNL but noted pain with pulling feeling with left foot Talocrural Joint AP: L hypomobile, painful where hand is purchased Talocrural Joint PA: L hypomobile  SPECIAL TESTS Ligamentous Integrity Anterior Drawer (ATF, 10-15 plantarflexion with anterior translation): Negative Talar Tilt (CFL, inversion): Negative    TODAY'S TREATMENT: 02/26/2024    Subjective: Reports ongoing L knee pain but no resting knee pain today. She has been having some mild R knee irritation recently. No specific questions upon arrival.   PAIN: Denies L knee pain    Therapeutic Activity  NuStep L2-4 x 10 minutes for BLE strengthening and L knee ROM during interval history; Total Gym L20 double leg partial squats 2 x 15; TG L20 double leg heel raises 2 x 15; Resisted side stepping with blue tband around ankles 10' x multiple bouts;   Ther-ex  Supine SLR with 3# AW 2 x 15 RLE, x 10 x 15 LLE; Hooklying bridge 2 x 15 BLE; Hooklying clams with manual resistance 2 x 10 BLE; Hooklying adductor squeeze with manual resistance 2 x 10 BLE;   Not today:  Ankle pumps 2 x 30s BLE; Seated ankle circumduction 2 x 30s each direction BLE; Seated resisted PF with green tband x 20 BLE; Seated resisted inversion with green tband x 20 BLE; Seated resisted eversion with green tband x 20 BLE; Supine isometric DF 3 x 20s BLE; Supine isometric PF 3 x 20s BLE; Ankle DF stretch 3 x 30s BLE; Prone L knee hamstring curls with manual resistance from therapist 2 x 10; Prone LLE straight knee hip extension 2 x 10; Prone L hip ER with knee at 90 flexion and manual resistance from therapist 2 x 10; Pball bridges with arms in front of chest 2 x 10; Standing hip strengthening with 4# ankle weights: Hip flexion marches x 15 BLE; HS curls x 15 BLE; Supine straight leg  hip abduction/adduction with manual resistance 2 x 10 BLE; Sidelying reverse clam with manual resistance 2 x 10 BLE; Sidelying hip abduction with manual resistance 2 x 10 BLE; SAQ over bolster with manual resistance 2 x 10  BLE; Heel slides with resisted extension 2 x 10 BLE;   PATIENT EDUCATION:  Education details: Pt educated throughout session about proper posture and technique with exercises. Improved exercise technique, movement at target joints, use of target muscles after min to mod verbal, visual, tactile cues.  Person educated: Patient Education method: Explanation, Actor cues, Verbal cues, and Handouts Education comprehension: verbalized understanding and returned demonstration   HOME EXERCISE PROGRAM:  Access Code: [email or web address] URL: [email or web address] Date: 12/11/2023 Prepared by: Selinda Eck  Exercises - Seated Ankle Eversion with Resistance  - 1 x daily - 7 x weekly - 2 sets - 10 reps - 3s hold - Seated Ankle Inversion with Resistance and Legs Crossed (Mirrored)  - 1 x daily - 7 x weekly - 2 sets - 10 reps - 3s hold - Seated Ankle Plantarflexion with Resistance  - 1  x daily - 7 x weekly - 2 sets - 10 reps - 3s hold - Long Sitting Calf Stretch with Strap  - 1 x daily - 7 x weekly - 3 reps - 30-45s hold - Standing Gastroc Stretch on Step  - 1 x daily - 7 x weekly - 3 sets - 10 reps - Standing Dorsiflexion Self-Mobilization on Step  - 1 x daily - 7 x weekly - 3 sets - 10 reps - Standing Dorsiflexion Self-Mobilization on Step (Mirrored)  - 1 x daily - 7 x weekly - 3 sets - 10 reps - Hooklying Isometric Hip Abduction with Belt  - 1 x daily - 7 x weekly - 2 sets - 5 reps - 30s hold/30s relax hold   ASSESSMENT:  CLINICAL IMPRESSION: Pt able to progress today to performing some WB exercises. She denies any increase in pain. After standing exercises progressed mat table strengthening for bilateral hip and LE musculature. She is able to increase reps with some of  her exercises. No HEP updates at this time. Pt encouraged to follow-up as scheduled. She will benefit from PT services to address deficits in strength and pain in order to improve pain-free function at home and with leisure activities.   OBJECTIVE IMPAIRMENTS: decreased strength and hypomobility.   ACTIVITY LIMITATIONS: stairs and locomotion level  PARTICIPATION LIMITATIONS: occupation and yard work  PERSONAL FACTORS: Age, Behavior pattern, Past/current experiences, Time since onset of injury/illness/exacerbation, and 1-2 comorbidities: type II diabetes and left lumbar radiculopathy are also affecting patient's functional outcome.   REHAB POTENTIAL: Good  CLINICAL DECISION MAKING: Evolving/moderate complexity  EVALUATION COMPLEXITY: Moderate   GOALS: Goals reviewed with patient? Yes  SHORT TERM GOALS:   Pt will be independent with HEP to improve strength and decrease ankle pain to improve pain-free function at home and work. Baseline:  Goal status: ACHIEVED   LONG TERM GOALS: Target date: 03/04/2024   1.  Pt will decrease worst ankle pain by at least 3 points on the NPRS in order to demonstrate clinically significant reduction in ankle pain. Baseline: 06/08/23: 1/10 best, 5/10 worst; 08/14/23: worst: 1/10; Goal status: ACHIEVED  2.  Pt will increase FADI score by at least 4 points in order demonstrate clinically significant reduction in ankle pain/disability.       Baseline: 06/13/23: 65%; 08/14/23: 80 / 104 or 77% Goal status: ACHIEVED  3.  Pt will improve strength of ankle inversion/eversion with limited to no pain in order to demonstrate improvement in strength and function.  Baseline: 06/08/23: 5/5 with pain; 08/14/23: 5/5 without pain Goal status: ACHIEVED  4.  Pt will report at least 50% improvement in L ankle and knee symptoms in order to demonstrate improvement in her ability to work in the yard/garden with less pain.  Baseline: 08/14/23: 75% improvement Goal status:  ACHIEVED  5.  Pt will decrease HAGOS score by at least 20 points in order demonstrate clinically significant reduction in hip pain/disability.  Baseline: 08/14/23: HAGOS Score: 37.7%, 12/11/23: 44.3%  Goal status: PARTIALLY MET  6.  Pt will decrease worst posterior R hip pain by at least 3 points on the NPRS in order to demonstrate clinically significant reduction in hip pain. Baseline: 08/14/23: worst: 8/10; 12/11/23: 2/10; Goal status: ACHIEVED   PLAN: PT FREQUENCY: 1x/week  PT DURATION: 12 weeks  PLANNED INTERVENTIONS: Therapeutic exercises, Therapeutic activity, Neuromuscular re-education, Balance training, Gait training, Patient/Family education, Self Care, Joint mobilization, Joint manipulation, Vestibular training, Canalith repositioning, Orthotic/Fit training, DME instructions, Dry Needling,  Electrical stimulation, Spinal manipulation, Spinal mobilization, Cryotherapy, Moist heat, Taping, Traction, Ultrasound, Ionotophoresis 4mg /ml Dexamethasone , Manual therapy, and Re-evaluation.  PLAN FOR NEXT SESSION: Progress note, strengthening and mobility exercises focused on R hip pain, review and modify HEP as necessary  Jaskarn Schweer D Chidinma Clites PT, DPT, GCS  9:30 AM,02/26/24

## 2024-02-25 NOTE — Progress Notes (Signed)
 Remote Loop Recorder Transmission

## 2024-02-26 ENCOUNTER — Ambulatory Visit

## 2024-02-26 DIAGNOSIS — M25551 Pain in right hip: Secondary | ICD-10-CM

## 2024-02-26 DIAGNOSIS — G8929 Other chronic pain: Secondary | ICD-10-CM | POA: Diagnosis not present

## 2024-02-26 DIAGNOSIS — M25562 Pain in left knee: Secondary | ICD-10-CM | POA: Diagnosis not present

## 2024-02-26 DIAGNOSIS — M6281 Muscle weakness (generalized): Secondary | ICD-10-CM | POA: Diagnosis not present

## 2024-02-26 NOTE — Telephone Encounter (Signed)
 Requested Prescriptions  Pending Prescriptions Disp Refills   FARXIGA  10 MG TABS tablet [Pharmacy Med Name: FARXIGA  TABS 10MG ] 90 tablet 3    Sig: TAKE 1 TABLET DAILY BEFORE BREAKFAST     Endocrinology:  Diabetes - SGLT2 Inhibitors Passed - 02/26/2024  9:46 AM      Passed - Cr in normal range and within 360 days    Creatinine  Date Value Ref Range Status  07/31/2011 1.04 0.60 - 1.30 mg/dL Final   Creatinine, Ser  Date Value Ref Range Status  12/19/2023 0.93 0.57 - 1.00 mg/dL Final         Passed - HBA1C is between 0 and 7.9 and within 180 days    Hemoglobin A1C  Date Value Ref Range Status  01/25/2024 6.0 (A) 4.0 - 5.6 % Final   Hgb A1c MFr Bld  Date Value Ref Range Status  05/18/2023 5.9 (H) 4.8 - 5.6 % Final    Comment:             Prediabetes: 5.7 - 6.4          Diabetes: >6.4          Glycemic control for adults with diabetes: <7.0          Passed - eGFR in normal range and within 360 days    EGFR (African American)  Date Value Ref Range Status  07/31/2011 >60 >55mL/min Final   GFR calc Af Amer  Date Value Ref Range Status  01/24/2020 >60 >60 mL/min Final   EGFR (Non-African Amer.)  Date Value Ref Range Status  07/31/2011 58 (L) >40mL/min Final    Comment:    eGFR values <45mL/min/1.73 m2 may be an indication of chronic kidney disease (CKD). Calculated eGFR, using the MRDR Study equation, is useful in  patients with stable renal function. The eGFR calculation will not be reliable in acutely ill patients when serum creatinine is changing rapidly. It is not useful in patients on dialysis. The eGFR calculation may not be applicable to patients at the low and high extremes of body sizes, pregnant women, and vegetarians.    GFR, Estimated  Date Value Ref Range Status  02/12/2023 >60 >60 mL/min Final    Comment:    (NOTE) Calculated using the CKD-EPI Creatinine Equation (2021)    eGFR  Date Value Ref Range Status  12/19/2023 67 >59 mL/min/1.73 Final          Passed - Valid encounter within last 6 months    Recent Outpatient Visits           1 month ago Type II diabetes mellitus with complication (HCC)   [place] [facility], Leita DEL, MD   2 months ago Greater trochanteric pain syndrome of both lower extremities   [place] [facility], Selinda PARAS, MD   3 months ago Greater trochanteric pain syndrome of both lower extremities   [place] [facility], Selinda PARAS, MD   5 months ago Type II diabetes mellitus with complication [facility])   [place] [facility] at [facility], Leita DEL, MD       Future Appointments             In 3 months Ku, Selinda PARAS, MD [facility] at [facility], [phone] [place]

## 2024-02-27 ENCOUNTER — Ambulatory Visit: Payer: Self-pay | Admitting: Cardiology

## 2024-02-29 NOTE — Therapy (Signed)
 OUTPATIENT PHYSICAL THERAPY ANKLE/KNEE/HIP TREATMENT/PROGRESS NOTE/RECERTIFICATION  Dates of reporting period  12/21/23   to   03/04/24   Patient Name: Brandy [name] MRN: [id number] DOB:[birth year], 68 y.o., female Today's Date: 03/04/2024  END OF SESSION:  PT End of Session - 03/04/24 0853     Visit Number 30    Number of Visits 73    Date for Recertification  05/27/24    Authorization Type eval: 06/08/23    PT Start Time 0848    PT Stop Time 0930    PT Time Calculation (min) 42 min    Activity Tolerance Patient tolerated treatment well    Behavior During Therapy [facility] for tasks assessed/performed         Past Medical History:  Diagnosis Date   Acute lower GI bleeding 01/19/2020   Arthritis    Atrial fibrillation with rapid ventricular response (HCC)    Chicken pox    Chronic systolic CHF (congestive heart failure) (HCC)    a. 03/2016 Echo: Ef 15-20%, diff HK, ant AK;  b. 05/2016 Echo: Ef 30-35%, diff HK, mildly dil LA/RA.   Diabetes mellitus without complication (HCC)    Dysrhythmia    GERD (gastroesophageal reflux disease)    Heart murmur    Hip discomfort    been going on for 40 years    History of kidney stones    Hyperlipidemia associated with type 2 diabetes mellitus (HCC) 05/12/2021   Hypertension    Moderate mitral regurgitation    a. 03/2016 Echo: mod MR in setting of LV dysfxn.   Motion sickness    car - back seat   NICM (nonischemic cardiomyopathy) (HCC)    a. 03/2016 Echo: EF 15-20%, diff HK, ant AK, mod MR, mod dil LA, mildly dil RA;  b. 04/2016 Cath: nl cors;  c. 05/2016 Echo: EF 30-35%, diff HK.   Obesity    Obstructive sleep apnea    compliant with CPAP   Persistent atrial fibrillation (HCC)    a. Dx 03/2016;  b. CHA2DS2VASc = 2-->Eliquis  5mg  BID;  b. 05/2016 Failed DCCV x 4.   Sleep apnea    Stomach irritation    Visit for monitoring Tikosyn  therapy 07/24/2016   Past Surgical History:  Procedure Laterality Date   ATRIAL FIBRILLATION ABLATION N/A  12/09/2020   Procedure: ATRIAL FIBRILLATION ABLATION;  Surgeon: Cindie Ole ONEIDA, MD;  Location: [facility];  Service: Cardiovascular;  Laterality: N/A;   BIOPSY N/A 01/12/2020   Procedure: BIOPSY;  Surgeon: Janalyn Keene NOVAK, MD;  Location: [facility];  Service: Endoscopy;  Laterality: N/A;   CARDIAC CATHETERIZATION N/A 04/17/2016   Procedure: Left Heart Cath and Coronary Angiography;  Surgeon: Deatrice DELENA Cage, MD;  Location: [facility] INVASIVE CV LAB;  Service: Cardiovascular;  Laterality: N/A;   CARDIOVERSION N/A 10/22/2020   Procedure: CARDIOVERSION;  Surgeon: Mady Bruckner, MD;  Location: [facility] ORS;  Service: Cardiovascular;  Laterality: N/A;   CARDIOVERSION N/A 01/18/2024   Procedure: CARDIOVERSION;  Surgeon: Shlomo Wilbert SAUNDERS, MD;  Location: [facility];  Service: Cardiovascular;  Laterality: N/A;   CHOLECYSTECTOMY     COLON SURGERY  2021   COLONOSCOPY N/A 01/20/2020   Procedure: COLONOSCOPY;  Surgeon: Maryruth Ole ONEIDA, MD;  Location: [facility] ENDOSCOPY;  Service: Endoscopy;  Laterality: N/A;   COLONOSCOPY WITH PROPOFOL  N/A 01/12/2020   Procedure: COLONOSCOPY WITH PROPOFOL ;  Surgeon: Janalyn Keene NOVAK, MD;  Location: [facility];  Service: Endoscopy;  Laterality: N/A;  Diabetic - oral meds  sleep apnea   ELECTROPHYSIOLOGIC STUDY N/A 05/26/2016   Procedure: Cardioversion;  Surgeon: Deatrice DELENA Cage, MD;  Location: [facility] ORS;  Service: Cardiovascular;  Laterality: N/A;   ESOPHAGOGASTRODUODENOSCOPY (EGD) WITH PROPOFOL  N/A 01/12/2020   Procedure: ESOPHAGOGASTRODUODENOSCOPY (EGD) WITH PROPOFOL ;  Surgeon: Janalyn Keene NOVAK, MD;  Location: [facility];  Service: Endoscopy;  Laterality: N/A;   POLYPECTOMY N/A 01/12/2020   Procedure: POLYPECTOMY;  Surgeon: Janalyn Keene NOVAK, MD;  Location: [facility];  Service: Endoscopy;  Laterality: N/A;   Patient Active Problem List   Diagnosis Date Noted   Mucous cyst of digit of left hand 05/18/2023   Peroneal  tendinitis, left 10/13/2022   Left lumbar radiculopathy 07/26/2022   Arthralgia of left knee 07/26/2022   Pes anserinus bursitis of right knee 04/19/2022   Osteoarthritis of both knees 03/27/2022   Greater trochanteric pain syndrome of both lower extremities 03/27/2022   It band syndrome, right 03/27/2022   Hyperlipidemia associated with type 2 diabetes mellitus (HCC) 05/12/2021   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   Varicose veins of leg with pain, bilateral 07/06/2020   Type II diabetes mellitus with complication (HCC) 01/19/2020   Gastric polyp    Colon polyps    Varicose veins of both lower extremities 10/20/2019   Dysphagia 10/20/2019   Osteoarthritis of right hand 10/31/2016   Nonischemic cardiomyopathy (HCC) 04/14/2016   BMI 33.0-33.9,adult 04/14/2016   Persistent atrial fibrillation (HCC) 04/12/2016   PCP: Leita Adie, MD  REFERRING PROVIDER: Selinda Ku, MD  REFERRING DIAG:  [phone] (ICD-10-CM) - Peroneal tendinitis, left  M54.16 (ICD-10-CM) - Left lumbar radiculopathy  M25.562 (ICD-10-CM) - Arthralgia of left knee  M17.11 (ICD-10-CM) - Primary osteoarthritis of right knee  M76.31 (ICD-10-CM) - It band syndrome, right  M25.551 (ICD-10-CM) - Greater 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   Rationale for Evaluation and Treatment: Rehabilitation  THERAPY DIAG: Muscle weakness (generalized) - Plan: PT plan of care cert/re-cert  Pain in right hip - Plan: PT plan of care cert/re-cert  Chronic pain of left knee - Plan: PT plan of care cert/re-cert  ONSET DATE: Chronic, aggravated over Christmas (December 2024)  FOLLOW-UP APPT SCHEDULED WITH REFERRING PROVIDER: No, initially no but now has appt for July;  INITIAL EVALUATION: SUBJECTIVE:                                                                                                                                                                                         SUBJECTIVE  STATEMENT:  Left ankle and foot pain  PERTINENT HISTORY: Pt reports pain in left ankle that seems to have flared up over Christmas after pt  reported excess walking. Pt reports feeling pulling and burning at the base of the lateral ankle. She has previously been to PT for pain in her left hip and knee but has now noticed increased pain in the left ankle/foot. Pt expresses difficulty with exercises; specifically pressure on the toes, stretching calves, and rocking on toes. Pt notes pain is as high as 5/10 NPS but is constant and never really goes away, with lowest pain at 1/10 NPS.  PAIN:    Pain Intensity: Present: 1/10, Best: 1/10, Worst: 5/10 Pain location: Lateral foot and ankle Pain Quality: constant and burning  Radiating: Yes  Swelling: No  Popping, catching, locking: No  Numbness/Tingling: No Focal Weakness: Yes Aggravating factors: Touching lateral ankle, moving the ankle, ascending steps when leading with left foot, walking Relieving factors: Stopping activity, resting, brace temporarily relieves some pain 24-hour pain behavior: No night pain when sleeping on back, stretching in AM helps but feels unstable, PM pain depends on activity during the day History of prior back, hip, knee, or ankle injury, pain, surgery, or therapy: Yes; chronic hip, knee, foot pain Dominant hand: right Imaging: No  Typical footwear: Sneakers or rubber boots Red flags: Negative for personal history of cancer, chills/fever, night sweats, nausea, vomiting, unrelenting pain): Negative  PRECAUTIONS: None  WEIGHT BEARING RESTRICTIONS: No  FALLS: Has patient fallen in last 6 months? 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  OBJECTIVE:   Patient Surveys  08/14/23: FADI: FADI Activities Score: 80 / 104 or 77%  Cognition Patient is oriented to person, place, and time.  Recent memory is intact.  Remote memory is intact.  Attention span and concentration are intact.  Expressive speech is intact.  Patient's fund of knowledge is within normal limits for educational level.    Gross Musculoskeletal Assessment Bulk: Normal Tone: Normal No trophic changes noted to foot/ankle. No ecchymosis, erythema, or edema noted. No gross ankle/foot deformity noted  GAIT: Comments: Mild toe out gait on right foot. Antalgic gait noted on left side.  Posture: No noted discrepancies.  AROM AROM (Normal range in degrees) AROM       Hip Right Left  Flexion (125) WNL WNL  Extension (15) WNL WNL  Abduction (40) WNL WNL  Adduction  WNL WNL  Internal Rotation (45) WNL WNL  External Rotation (45) WNL WNL      Knee    Flexion (135) WNL WNL  Extension (0) WNL WNL      Ankle    Dorsiflexion (20) 10 (WB) 10 (WB)  Plantarflexion (50) 40 35  Inversion (35) 30 20  Eversion (15) 7 8  (* = pain; Blank rows = not tested)  LE MMT: MMT (out of 5) Right  Left   Hip flexion 4 4  Hip extension 4 4  Hip abduction 4 4+  Hip adduction 4+ 4  Hip internal rotation 5 5  Hip external rotation 5 5  Knee flexion 4 4  Knee extension 5 5  Ankle  dorsiflexion 5 5  Ankle plantarflexion    Ankle inversion 5 5*  Ankle eversion 5 5*  (* = pain; Blank rows = not tested)  Sensation Diminished sensation left S1 and lateral aspect of left foot.  Reflexes Deferred  Palpation Location LEFT  RIGHT           Plantar fascia 1 0  Dorsal aspect of  foot 1 0  Tarsal tunnel 1 0  (Blank rows = not tested) Graded on 0-4 scale (0 = no pain, 1 = pain, 2 = pain with wincing/grimacing/flinching, 3 = pain with withdrawal, 4 = unwilling to allow palpation), (Blank rows = not tested)  Passive Accessory Motion Superior Tibiofibular Joint: WNL Inferior Tibiofibular Joint: WNL Talocrural Joint Distraction: WNL but noted pain with pulling feeling with left foot Talocrural Joint AP: L hypomobile, painful where hand is purchased Talocrural Joint PA: L hypomobile  SPECIAL TESTS Ligamentous Integrity Anterior Drawer (ATF, 10-15 plantarflexion with anterior translation): Negative Talar Tilt (CFL, inversion): Negative    TODAY'S TREATMENT: 03/04/2024    Subjective: Reports ongoing L knee pain but no resting knee pain today. She went to the [facility] and her L knee and R hip pain increased considerably because she was there walking for 6 hours. No specific questions upon arrival.   PAIN: Denies L knee pain    Ther-ex  NuStep L2-5 x 10 minutes for BLE strengthening and L knee ROM during interval history;  Updated outcome measures: HAGOS Score: 37.3% (Symptoms Score: 46.4%; Pain Score: 42.5%; Physical Function, Daily Living Score: 40%; Function, Sports, and Recreational Activities Score: 37.5%; Participation in Physical Activities Score: 37.5%; Quality of Life Score: 20 %)  Supine SLR with 3# AW 2 x 15 BLE; Bridge x 15 BLE; Bridge marches 2 x 10 BLE;  Sidelying clams with manual resistance x 10 BLE; Sidelying reverse clams with manual resistance x 10 BLE; Sidelying hip abduction with manual resistance x 10 BLE;   Not today:  Prone L knee hamstring curls with manual resistance from therapist 2 x 10; Prone LLE straight knee hip extension 2 x 10; Prone L hip ER with knee at 90 flexion and manual resistance from therapist 2 x 10; Pball bridges with arms in front of chest 2 x 10; Standing hip strengthening with 4# ankle weights: Hip  flexion marches x 15 BLE; HS curls x 15 BLE; Supine straight leg hip abduction/adduction with manual resistance 2 x 10 BLE; Heel slides with resisted extension 2 x 10 BLE; Total Gym L20 double leg partial squats 2 x 15; TG L20 double leg heel raises 2 x 15; Resisted side stepping with blue tband around ankles 10' x multiple bouts; SAQ over bolster with manual resistance 2 x 10  BLE;   PATIENT EDUCATION:  Education details: Pt educated throughout session about proper posture and technique with exercises. Improved exercise technique, movement at target joints, use of target muscles after min to mod verbal, visual, tactile cues.  Person educated: Patient Education method: Explanation, Actor cues, Verbal cues, and Handouts Education comprehension: verbalized understanding and returned demonstration   HOME EXERCISE PROGRAM:  Access Code: [email or web address] URL: [email or web address] Date: 12/11/2023 Prepared by: Selinda Eck  Exercises - Seated Ankle Eversion with Resistance  - 1 x daily - 7 x weekly - 2 sets - 10 reps - 3s hold - Seated Ankle Inversion with Resistance and Legs Crossed (Mirrored)  - 1 x daily - 7 x weekly - 2 sets - 10 reps - 3s hold -  Seated Ankle Plantarflexion with Resistance  - 1 x daily - 7 x weekly - 2 sets - 10 reps - 3s hold - Long Sitting Calf Stretch with Strap  - 1 x daily - 7 x weekly - 3 reps - 30-45s hold - Standing Gastroc Stretch on Step  - 1 x daily - 7 x weekly - 3 sets - 10 reps - Standing Dorsiflexion Self-Mobilization on Step  - 1 x daily - 7 x weekly - 3 sets - 10 reps - Standing Dorsiflexion Self-Mobilization on Step (Mirrored)  - 1 x daily - 7 x weekly - 3 sets - 10 reps - Hooklying Isometric Hip Abduction with Belt  - 1 x daily - 7 x weekly - 2 sets - 5 reps - 30s hold/30s relax hold   ASSESSMENT:  CLINICAL IMPRESSION: Updated outcome measures with patient during visit today. Her worst R hip pain has improved considerably since her  initial evaluation but worsened since the last update. Her HAGOS score improved from 38% initially to 44% at the last update but has decreased again today to 37%. Her hip pain continues to gradually worsen as her time away from her injection increases. She reports ongoing intermittent and activity dependent L foot, L ankle, L knee, and L hip pain. She has still met all the goals related to her ankle and knee. Progressed strengthening for hips today with focus on minimizing/eliminating pain. No HEP updates at this time. She will benefit from PT services to address deficits in strength and pain in order to improve pain-free function at home and with leisure activities.   OBJECTIVE IMPAIRMENTS: decreased strength and hypomobility.   ACTIVITY LIMITATIONS: stairs and locomotion level  PARTICIPATION LIMITATIONS: occupation and yard work  PERSONAL FACTORS: Age, Behavior pattern, Past/current experiences, Time since onset of injury/illness/exacerbation, and 1-2 comorbidities: type II diabetes and left lumbar radiculopathy are also affecting patient's functional outcome.   REHAB POTENTIAL: Good  CLINICAL DECISION MAKING: Evolving/moderate complexity  EVALUATION COMPLEXITY: Moderate   GOALS: Goals reviewed with patient? Yes  SHORT TERM GOALS:   Pt will be independent with HEP to improve strength and decrease ankle pain to improve pain-free function at home and work. Baseline:  Goal status: ACHIEVED   LONG TERM GOALS: Target date: 03/04/2024   1.  Pt will decrease worst ankle pain by at least 3 points on the NPRS in order to demonstrate clinically significant reduction in ankle pain. Baseline: 06/08/23: 1/10 best, 5/10 worst; 08/14/23: worst: 1/10; 03/04/24: 2/10; Goal status: ACHIEVED  2.  Pt will increase FADI score by at least 4 points in order demonstrate clinically significant reduction in ankle pain/disability.       Baseline: 06/13/23: 65%; 08/14/23: 80 / 104 or 77% Goal status:  ACHIEVED  3.  Pt will improve strength of ankle inversion/eversion with limited to no pain in order to demonstrate improvement in strength and function.  Baseline: 06/08/23: 5/5 with pain; 08/14/23: 5/5 without pain Goal status: ACHIEVED  4.  Pt will report at least 50% improvement in L ankle and knee symptoms in order to demonstrate improvement in her ability to work in the yard/garden with less pain.  Baseline: 08/14/23: 75% improvement Goal status: ACHIEVED  5.  Pt will decrease HAGOS score by at least 20 points in order demonstrate clinically significant reduction in hip pain/disability.  Baseline: 08/14/23: HAGOS Score: 37.7%, 12/11/23: 44.3%; 03/04/24: 37.3% Goal status: ONGOING  6.  Pt will decrease worst posterior R hip pain by at least 3  points on the NPRS in order to demonstrate clinically significant reduction in hip pain. Baseline: 08/14/23: worst: 8/10; 12/11/23: 2/10; 03/04/24: 5/10; Goal status: ACHIEVED   PLAN: PT FREQUENCY: 1x/week  PT DURATION: 12 weeks  PLANNED INTERVENTIONS: Therapeutic exercises, Therapeutic activity, Neuromuscular re-education, Balance training, Gait training, Patient/Family education, Self Care, Joint mobilization, Joint manipulation, Vestibular training, Canalith repositioning, Orthotic/Fit training, DME instructions, Dry Needling, Electrical stimulation, Spinal manipulation, Spinal mobilization, Cryotherapy, Moist heat, Taping, Traction, Ultrasound, Ionotophoresis 4mg /ml Dexamethasone , Manual therapy, and Re-evaluation.  PLAN FOR NEXT SESSION: strengthening and mobility exercises focused on R hip pain, review and modify HEP as necessary  Treacy Holcomb D Lyall Faciane PT, DPT, GCS  9:34 AM,03/04/24

## 2024-03-04 ENCOUNTER — Ambulatory Visit

## 2024-03-04 DIAGNOSIS — M25562 Pain in left knee: Secondary | ICD-10-CM | POA: Diagnosis not present

## 2024-03-04 DIAGNOSIS — M6281 Muscle weakness (generalized): Secondary | ICD-10-CM

## 2024-03-04 DIAGNOSIS — G8929 Other chronic pain: Secondary | ICD-10-CM

## 2024-03-04 DIAGNOSIS — M25551 Pain in right hip: Secondary | ICD-10-CM

## 2024-03-07 ENCOUNTER — Other Ambulatory Visit: Payer: Self-pay | Admitting: Internal Medicine

## 2024-03-07 DIAGNOSIS — E118 Type 2 diabetes mellitus with unspecified complications: Secondary | ICD-10-CM

## 2024-03-07 NOTE — Therapy (Incomplete)
 OUTPATIENT PHYSICAL THERAPY ANKLE/KNEE/HIP TREATMENT   Patient Name: Brandy [name] MRN: [id number] DOB:02-15-1956, 68 y.o., female Today's Date: 03/07/2024  END OF SESSION:   Past Medical History:  Diagnosis Date   Acute lower GI bleeding 01/19/2020   Arthritis    Atrial fibrillation with rapid ventricular response (HCC)    Chicken pox    Chronic systolic CHF (congestive heart failure) (HCC)    a. 03/2016 Echo: Ef 15-20%, diff HK, ant AK;  b. 05/2016 Echo: Ef 30-35%, diff HK, mildly dil LA/RA.   Diabetes mellitus without complication (HCC)    Dysrhythmia    GERD (gastroesophageal reflux disease)    Heart murmur    Hip discomfort    been going on for 40 years    History of kidney stones    Hyperlipidemia associated with type 2 diabetes mellitus (HCC) 05/12/2021   Hypertension    Moderate mitral regurgitation    a. 03/2016 Echo: mod MR in setting of LV dysfxn.   Motion sickness    car - back seat   NICM (nonischemic cardiomyopathy) (HCC)    a. 03/2016 Echo: EF 15-20%, diff HK, ant AK, mod MR, mod dil LA, mildly dil RA;  b. 04/2016 Cath: nl cors;  c. 05/2016 Echo: EF 30-35%, diff HK.   Obesity    Obstructive sleep apnea    compliant with CPAP   Persistent atrial fibrillation (HCC)    a. Dx 03/2016;  b. CHA2DS2VASc = 2-->Eliquis  5mg  BID;  b. 05/2016 Failed DCCV x 4.   Sleep apnea    Stomach irritation    Visit for monitoring Tikosyn  therapy 07/24/2016   Past Surgical History:  Procedure Laterality Date   ATRIAL FIBRILLATION ABLATION N/A 12/09/2020   Procedure: ATRIAL FIBRILLATION ABLATION;  Surgeon: Cindie Ole ONEIDA, MD;  Location: [facility];  Service: Cardiovascular;  Laterality: N/A;   BIOPSY N/A 01/12/2020   Procedure: BIOPSY;  Surgeon: Janalyn Keene NOVAK, MD;  Location: [facility];  Service: Endoscopy;  Laterality: N/A;   CARDIAC CATHETERIZATION N/A 04/17/2016   Procedure: Left Heart Cath and Coronary Angiography;  Surgeon: Deatrice DELENA Cage, MD;  Location:  [facility];  Service: Cardiovascular;  Laterality: N/A;   CARDIOVERSION N/A 10/22/2020   Procedure: CARDIOVERSION;  Surgeon: Mady Bruckner, MD;  Location: [facility];  Service: Cardiovascular;  Laterality: N/A;   CARDIOVERSION N/A 01/18/2024   Procedure: CARDIOVERSION;  Surgeon: Shlomo Wilbert SAUNDERS, MD;  Location: [facility];  Service: Cardiovascular;  Laterality: N/A;   CHOLECYSTECTOMY     COLON SURGERY  2021   COLONOSCOPY N/A 01/20/2020   Procedure: COLONOSCOPY;  Surgeon: Maryruth Ole ONEIDA, MD;  Location: [facility];  Service: Endoscopy;  Laterality: N/A;   COLONOSCOPY WITH PROPOFOL  N/A 01/12/2020   Procedure: COLONOSCOPY WITH PROPOFOL ;  Surgeon: Janalyn Keene NOVAK, MD;  Location: [facility];  Service: Endoscopy;  Laterality: N/A;  Diabetic - oral meds sleep apnea   ELECTROPHYSIOLOGIC STUDY N/A 05/26/2016   Procedure: Cardioversion;  Surgeon: Deatrice DELENA Cage, MD;  Location: [facility] OR[facility];  Service: Cardiovascular;  Laterality: N/A;   ESOPHAGOGASTRODUODENOSCOPY (EGD) WITH PROPOFOL  N/A 01/12/2020   Procedure: ESOPHAGOGASTRODUODENOSCOPY (EGD) WITH PROPOFOL ;  Surgeon: Janalyn Keene NOVAK, MD;  Location: [facility];  Service: Endoscopy;  Laterality: N/A;   POLYPECTOMY N/A 01/12/2020   Procedure: POLYPECTOMY;  Surgeon: Janalyn Keene NOVAK, MD;  Location: [facility];  Service: Endoscopy;  Laterality: N/A;   Patient Active Problem List   Diagnosis Date Noted  Mucous cyst of digit of left hand 05/18/2023   Peroneal tendinitis, left 10/13/2022   Left lumbar radiculopathy 07/26/2022   Arthralgia of left knee 07/26/2022   Pes anserinus bursitis of right knee 04/19/2022   Osteoarthritis of both knees 03/27/2022   Greater trochanteric pain syndrome of both lower extremities 03/27/2022   It band syndrome, right 03/27/2022   Hyperlipidemia associated with type 2 diabetes mellitus (HCC) 05/12/2021   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   Varicose  veins of leg with pain, bilateral 07/06/2020   Type II diabetes mellitus with complication (HCC) 01/19/2020   Gastric polyp    Colon polyps    Varicose veins of both lower extremities 10/20/2019   Dysphagia 10/20/2019   Osteoarthritis of right hand 10/31/2016   Nonischemic cardiomyopathy (HCC) 04/14/2016   BMI 33.0-33.9,adult 04/14/2016   Persistent atrial fibrillation (HCC) 04/12/2016   PCP: Leita Adie, MD  REFERRING PROVIDER: Selinda Ku, MD  REFERRING DIAG:  [phone] (ICD-10-CM) - Peroneal tendinitis, left  M54.16 (ICD-10-CM) - Left lumbar radiculopathy  M25.562 (ICD-10-CM) - Arthralgia of left knee  M17.11 (ICD-10-CM) - Primary osteoarthritis of right knee  M76.31 (ICD-10-CM) - It band syndrome, right  M25.551 (ICD-10-CM) - Greater 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   Rationale for Evaluation and Treatment: Rehabilitation  THERAPY DIAG: Muscle weakness (generalized)  Pain in right hip  Chronic pain of left knee  ONSET DATE: Chronic, aggravated over Christmas (December 2024)  FOLLOW-UP APPT SCHEDULED WITH REFERRING PROVIDER: No, initially no but now has appt for July;  INITIAL EVALUATION: SUBJECTIVE:                                                                                                                                                                                         SUBJECTIVE STATEMENT:  Left ankle and foot pain  PERTINENT HISTORY: Pt reports pain in left ankle that seems to have flared up over Christmas after pt reported excess walking. Pt reports feeling pulling and burning at the base of the lateral ankle. She has previously been to PT for pain in her left hip and knee but has now noticed increased pain in the left ankle/foot. Pt expresses difficulty with exercises; specifically pressure on the toes, stretching calves, and rocking on toes. Pt notes pain is as high as 5/10 NPS but is constant and never really goes away, with  lowest pain at 1/10 NPS.  PAIN:    Pain Intensity: Present: 1/10, Best: 1/10, Worst: 5/10 Pain location: Lateral foot and ankle Pain Quality: constant and burning  Radiating: Yes  Swelling: No  Popping, catching, locking: No  Numbness/Tingling: No Focal Weakness: Yes  Aggravating factors: Touching lateral ankle, moving the ankle, ascending steps when leading with left foot, walking Relieving factors: Stopping activity, resting, brace temporarily relieves some pain 24-hour pain behavior: No night pain when sleeping on back, stretching in AM helps but feels unstable, PM pain depends on activity during the day History of prior back, hip, knee, or ankle injury, pain, surgery, or therapy: Yes; chronic hip, knee, foot pain Dominant hand: right Imaging: No  Typical footwear: Sneakers or rubber boots Red flags: Negative for personal history of cancer, chills/fever, night sweats, nausea, vomiting, unrelenting pain): Negative  PRECAUTIONS: None  WEIGHT BEARING RESTRICTIONS: No  FALLS: Has patient fallen in last 6 months? 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  OBJECTIVE:   Patient Surveys  08/14/23: FADI: FADI Activities Score: 80 / 104 or 77%  Cognition Patient is oriented to person, place, and time.  Recent memory is intact.  Remote memory is intact.  Attention span and concentration are intact.  Expressive speech  is intact.  Patient's fund of knowledge is within normal limits for educational level.    Gross Musculoskeletal Assessment Bulk: Normal Tone: Normal No trophic changes noted to foot/ankle. No ecchymosis, erythema, or edema noted. No gross ankle/foot deformity noted  GAIT: Comments: Mild toe out gait on right foot. Antalgic gait noted on left side.  Posture: No noted discrepancies.  AROM AROM (Normal range in degrees) AROM       Hip Right Left  Flexion (125) WNL WNL  Extension (15) WNL WNL  Abduction (40) WNL WNL  Adduction  WNL WNL  Internal Rotation (45) WNL WNL  External Rotation (45) WNL WNL      Knee    Flexion (135) WNL WNL  Extension (0) WNL WNL      Ankle    Dorsiflexion (20) 10 (WB) 10 (WB)  Plantarflexion (50) 40 35  Inversion (35) 30 20  Eversion (15) 7 8  (* = pain; Blank rows = not tested)  LE MMT: MMT (out of 5) Right  Left   Hip flexion 4 4  Hip extension 4 4  Hip abduction 4 4+  Hip adduction 4+ 4  Hip internal rotation 5 5  Hip external rotation 5 5  Knee flexion 4 4  Knee extension 5 5  Ankle dorsiflexion 5 5  Ankle plantarflexion    Ankle inversion 5 5*  Ankle eversion 5 5*  (* = pain; Blank rows = not tested)  Sensation Diminished sensation left S1 and lateral aspect of left foot.  Reflexes Deferred  Palpation Location LEFT  RIGHT           Plantar fascia 1 0  Dorsal aspect of foot 1 0  Tarsal tunnel 1 0  (Blank rows = not tested) Graded on 0-4 scale (0 = no pain, 1 = pain, 2 = pain with wincing/grimacing/flinching, 3 = pain with withdrawal, 4 = unwilling to allow palpation), (Blank rows = not tested)  Passive Accessory Motion Superior Tibiofibular Joint: WNL  Inferior Tibiofibular Joint: WNL Talocrural Joint Distraction: WNL but noted pain with pulling feeling with left foot Talocrural Joint AP: L hypomobile, painful where hand is purchased Talocrural Joint PA: L hypomobile  SPECIAL TESTS Ligamentous Integrity Anterior  Drawer (ATF, 10-15 plantarflexion with anterior translation): Negative Talar Tilt (CFL, inversion): Negative    TODAY'S TREATMENT: 03/07/2024    Subjective: Reports ongoing L knee pain but no resting knee pain today. She went to [facility] and her L knee and R hip pain increased considerably because she was there walking for 6 hours. No specific questions upon arrival.   PAIN: Denies L knee pain    Ther-ex  NuStep L2-5 x 10 minutes for BLE strengthening and L knee ROM during interval history;  Updated outcome measures: HAGOS Score: 37.3% (Symptoms Score: 46.4%; Pain Score: 42.5%; Physical Function, Daily Living Score: 40%; Function, Sports, and Recreational Activities Score: 37.5%; Participation in Physical Activities Score: 37.5%; Quality of Life Score: 20 %)  Supine SLR with 3# AW 2 x 15 BLE; Bridge x 15 BLE; Bridge marches 2 x 10 BLE;  Sidelying clams with manual resistance x 10 BLE; Sidelying reverse clams with manual resistance x 10 BLE; Sidelying hip abduction with manual resistance x 10 BLE;   Not today:  Prone L knee hamstring curls with manual resistance from therapist 2 x 10; Prone LLE straight knee hip extension 2 x 10; Prone L hip ER with knee at 90 flexion and manual resistance from therapist 2 x 10; Pball bridges with arms in front of chest 2 x 10; Standing hip strengthening with 4# ankle weights: Hip flexion marches x 15 BLE; HS curls x 15 BLE; Supine straight leg hip abduction/adduction with manual resistance 2 x 10 BLE; Heel slides with resisted extension 2 x 10 BLE; Total Gym L20 double leg partial squats 2 x 15; TG L20 double leg heel raises 2 x 15; Resisted side stepping with blue tband around ankles 10' x multiple bouts; SAQ over bolster with manual resistance 2 x 10  BLE;   PATIENT EDUCATION:  Education details: Pt educated throughout session about proper posture and technique with exercises. Improved exercise technique, movement at target  joints, use of target muscles after min to mod verbal, visual, tactile cues.  Person educated: Patient Education method: Explanation, Actor cues, Verbal cues, and Handouts Education comprehension: verbalized understanding and returned demonstration   HOME EXERCISE PROGRAM:  Access Code: [email or web address] URL: [email or web address] Date: 12/11/2023 Prepared by: Selinda Eck  Exercises - Seated Ankle Eversion with Resistance  - 1 x daily - 7 x weekly - 2 sets - 10 reps - 3s hold - Seated Ankle Inversion with Resistance and Legs Crossed (Mirrored)  - 1 x daily - 7 x weekly - 2 sets - 10 reps - 3s hold - Seated Ankle Plantarflexion with Resistance  - 1 x daily - 7 x weekly - 2 sets - 10 reps - 3s hold - Long Sitting Calf Stretch with Strap  - 1 x daily - 7 x weekly - 3 reps - 30-45s hold - Standing Gastroc Stretch on Step  - 1 x daily - 7 x weekly - 3 sets - 10 reps - Standing Dorsiflexion Self-Mobilization on Step  - 1 x daily - 7 x weekly - 3 sets - 10 reps - Standing Dorsiflexion Self-Mobilization on Step (Mirrored)  - 1 x daily - 7 x weekly - 3 sets - 10 reps - Hooklying Isometric Hip Abduction with Belt  - 1 x  daily - 7 x weekly - 2 sets - 5 reps - 30s hold/30s relax hold   ASSESSMENT:  CLINICAL IMPRESSION: Updated outcome measures with patient during visit today. Her worst R hip pain has improved considerably since her initial evaluation but worsened since the last update. Her HAGOS score improved from 38% initially to 44% at the last update but has decreased again today to 37%. Her hip pain continues to gradually worsen as her time away from her injection increases. She reports ongoing intermittent and activity dependent L foot, L ankle, L knee, and L hip pain. She has still met all the goals related to her ankle and knee. Progressed strengthening for hips today with focus on minimizing/eliminating pain. No HEP updates at this time. She will benefit from PT services to address  deficits in strength and pain in order to improve pain-free function at home and with leisure activities.   OBJECTIVE IMPAIRMENTS: decreased strength and hypomobility.   ACTIVITY LIMITATIONS: stairs and locomotion level  PARTICIPATION LIMITATIONS: occupation and yard work  PERSONAL FACTORS: Age, Behavior pattern, Past/current experiences, Time since onset of injury/illness/exacerbation, and 1-2 comorbidities: type II diabetes and left lumbar radiculopathy are also affecting patient's functional outcome.   REHAB POTENTIAL: Good  CLINICAL DECISION MAKING: Evolving/moderate complexity  EVALUATION COMPLEXITY: Moderate   GOALS: Goals reviewed with patient? Yes  SHORT TERM GOALS:   Pt will be independent with HEP to improve strength and decrease ankle pain to improve pain-free function at home and work. Baseline:  Goal status: ACHIEVED   LONG TERM GOALS: Target date: 03/04/2024   1.  Pt will decrease worst ankle pain by at least 3 points on the NPRS in order to demonstrate clinically significant reduction in ankle pain. Baseline: 06/08/23: 1/10 best, 5/10 worst; 08/14/23: worst: 1/10; 03/04/24: 2/10; Goal status: ACHIEVED  2.  Pt will increase FADI score by at least 4 points in order demonstrate clinically significant reduction in ankle pain/disability.       Baseline: 06/13/23: 65%; 08/14/23: 80 / 104 or 77% Goal status: ACHIEVED  3.  Pt will improve strength of ankle inversion/eversion with limited to no pain in order to demonstrate improvement in strength and function.  Baseline: 06/08/23: 5/5 with pain; 08/14/23: 5/5 without pain Goal status: ACHIEVED  4.  Pt will report at least 50% improvement in L ankle and knee symptoms in order to demonstrate improvement in her ability to work in the yard/garden with less pain.  Baseline: 08/14/23: 75% improvement Goal status: ACHIEVED  5.  Pt will decrease HAGOS score by at least 20 points in order demonstrate clinically significant reduction in  hip pain/disability.  Baseline: 08/14/23: HAGOS Score: 37.7%, 12/11/23: 44.3%; 03/04/24: 37.3% Goal status: ONGOING  6.  Pt will decrease worst posterior R hip pain by at least 3 points on the NPRS in order to demonstrate clinically significant reduction in hip pain. Baseline: 08/14/23: worst: 8/10; 12/11/23: 2/10; 03/04/24: 5/10; Goal status: ACHIEVED   PLAN: PT FREQUENCY: 1x/week  PT DURATION: 12 weeks  PLANNED INTERVENTIONS: Therapeutic exercises, Therapeutic activity, Neuromuscular re-education, Balance training, Gait training, Patient/Family education, Self Care, Joint mobilization, Joint manipulation, Vestibular training, Canalith repositioning, Orthotic/Fit training, DME instructions, Dry Needling, Electrical stimulation, Spinal manipulation, Spinal mobilization, Cryotherapy, Moist heat, Taping, Traction, Ultrasound, Ionotophoresis 4mg /ml Dexamethasone , Manual therapy, and Re-evaluation.  PLAN FOR NEXT SESSION: strengthening and mobility exercises focused on R hip pain, review and modify HEP as necessary  Quay Simkin D Othell Jaime PT, DPT, GCS  4:18 PM,03/07/24

## 2024-03-08 ENCOUNTER — Ambulatory Visit: Payer: Self-pay | Admitting: Cardiology

## 2024-03-08 NOTE — Telephone Encounter (Signed)
 Requested Prescriptions  Pending Prescriptions Disp Refills   metFORMIN  (GLUCOPHAGE -XR) 500 MG 24 hr tablet [Pharmacy Med Name: metFORMIN  HCl ER 500 MG Oral Tablet Extended Release 24 Hour] 90 tablet 0    Sig: Take 1/2 (one-half) tablet by mouth twice daily     Endocrinology:  Diabetes - Biguanides Failed - 03/08/2024 10:58 AM      Failed - B12 Level in normal range and within 720 days    Vitamin B-12  Date Value Ref Range Status  01/21/2020 186 180 - 914 pg/mL Final    Comment:    (NOTE) This assay is not validated for testing neonatal or myeloproliferative syndrome specimens for Vitamin B12 levels. Performed at [facility], [address], [place], [place] [address]          Passed - Cr in normal range and within 360 days    Creatinine  Date Value Ref Range Status  07/31/2011 1.04 0.60 - 1.30 mg/dL Final   Creatinine, Ser  Date Value Ref Range Status  12/19/2023 0.93 0.57 - 1.00 mg/dL Final         Passed - HBA1C is between 0 and 7.9 and within 180 days    Hemoglobin A1C  Date Value Ref Range Status  01/25/2024 6.0 (A) 4.0 - 5.6 % Final   Hgb A1c MFr Bld  Date Value Ref Range Status  05/18/2023 5.9 (H) 4.8 - 5.6 % Final    Comment:             Prediabetes: 5.7 - 6.4          Diabetes: >6.4          Glycemic control for adults with diabetes: <7.0          Passed - eGFR in normal range and within 360 days    EGFR (African American)  Date Value Ref Range Status  07/31/2011 >60 >65mL/min Final   GFR calc Af Amer  Date Value Ref Range Status  01/24/2020 >60 >60 mL/min Final   EGFR (Non-African Amer.)  Date Value Ref Range Status  07/31/2011 58 (L) >63mL/min Final    Comment:    eGFR values <56mL/min/1.73 m2 may be an indication of chronic kidney disease (CKD). Calculated eGFR, using the MRDR Study equation, is useful in  patients with stable renal function. The eGFR calculation will not be reliable in acutely ill patients when serum creatinine is  changing rapidly. It is not useful in patients on dialysis. The eGFR calculation may not be applicable to patients at the low and high extremes of body sizes, pregnant women, and vegetarians.    GFR, Estimated  Date Value Ref Range Status  02/12/2023 >60 >60 mL/min Final    Comment:    (NOTE) Calculated using the CKD-EPI Creatinine Equation (2021)    eGFR  Date Value Ref Range Status  12/19/2023 67 >59 mL/min/1.73 Final         Passed - Valid encounter within last 6 months    Recent Outpatient Visits           1 month ago Type II diabetes mellitus with complication [facility])   [place] [facility] at [facility], Leita DEL, MD   3 months ago Greater trochanteric pain syndrome of both lower extremities   [place] [facility] at [facility], Selinda PARAS, MD   3 months ago Greater trochanteric pain syndrome of both lower extremities   [facility] at [facility], Selinda PARAS, MD   5 months ago Type II diabetes mellitus with complication [facility])   [place] [facility] at [facility], Leita DEL, MD       Future Appointments             In 2 months Alvia, Selinda PARAS, MD [facility] at [facility], [phone] [place]            Passed - CBC within normal limits and completed in the last 12 months    WBC  Date Value Ref Range Status  12/19/2023 13.8 (H) 3.4 - 10.8 x10E3/uL Final  02/12/2023 10.4 4.0 - 10.5 K/uL Final   RBC  Date Value Ref Range Status  12/19/2023 5.44 (H) 3.77 - 5.28 x10E6/uL Final  02/12/2023 4.64 3.87 - 5.11 MIL/uL Final   Hemoglobin  Date Value Ref Range Status  12/19/2023 16.9 (H) 11.1 - 15.9 g/dL Final   Hematocrit  Date Value Ref Range Status  12/19/2023 50.7 (H) 34.0 - 46.6 % Final   MCHC  Date Value Ref Range Status  12/19/2023 33.3 31.5 - 35.7 g/dL Final  90/69/7975 65.1  30.0 - 36.0 g/dL Final   [facility]  Date Value Ref Range Status  12/19/2023 31.1 26.6 - 33.0 pg Final  02/12/2023 30.6 26.0 - 34.0 pg Final   MCV  Date Value Ref Range Status  12/19/2023 93 79 - 97 fL Final  07/31/2011 90 80 - 100 fL Final   No results found for: PLTCOUNTKUC, LABPLAT, POCPLA RDW  Date Value Ref Range Status  12/19/2023 12.5 11.7 - 15.4 % Final  07/31/2011 11.8 11.5 - 14.5 % Final

## 2024-03-10 DIAGNOSIS — E119 Type 2 diabetes mellitus without complications: Secondary | ICD-10-CM | POA: Diagnosis not present

## 2024-03-10 DIAGNOSIS — H52223 Regular astigmatism, bilateral: Secondary | ICD-10-CM | POA: Diagnosis not present

## 2024-03-10 DIAGNOSIS — H524 Presbyopia: Secondary | ICD-10-CM | POA: Diagnosis not present

## 2024-03-10 DIAGNOSIS — H5213 Myopia, bilateral: Secondary | ICD-10-CM | POA: Diagnosis not present

## 2024-03-10 LAB — OPHTHALMOLOGY REPORT-SCANNED

## 2024-03-11 ENCOUNTER — Ambulatory Visit

## 2024-03-11 DIAGNOSIS — G8929 Other chronic pain: Secondary | ICD-10-CM

## 2024-03-11 DIAGNOSIS — M6281 Muscle weakness (generalized): Secondary | ICD-10-CM

## 2024-03-11 DIAGNOSIS — M25551 Pain in right hip: Secondary | ICD-10-CM

## 2024-03-14 ENCOUNTER — Ambulatory Visit (INDEPENDENT_AMBULATORY_CARE_PROVIDER_SITE_OTHER): Admitting: Internal Medicine

## 2024-03-14 ENCOUNTER — Encounter: Payer: Self-pay | Admitting: Internal Medicine

## 2024-03-14 VITALS — BP 126/74 | HR 74 | Temp 97.7°F | Ht 67.0 in | Wt 235.0 lb

## 2024-03-14 DIAGNOSIS — J0100 Acute maxillary sinusitis, unspecified: Secondary | ICD-10-CM | POA: Diagnosis not present

## 2024-03-14 MED ORDER — AZITHROMYCIN 250 MG PO TABS: ORAL_TABLET | ORAL | 0 refills | Status: AC

## 2024-03-14 MED ORDER — FLUTICASONE PROPIONATE 50 MCG/ACT NA SUSP: 2.0000 | Freq: Every day | NASAL | 0 refills | Status: AC

## 2024-03-14 NOTE — Patient Instructions (Signed)
 Begin Flonase spray daily

## 2024-03-14 NOTE — Progress Notes (Signed)
 Date:  03/14/2024   Name:  Brandy [name]   DOB:  [birth year]   MRN:  [id number]   Chief Complaint: Sore Throat (X9 days. No covid test. Tried essential oils and this didn't help this time. Yellow/bloody congestion. Left sinus pain. Sore throat.)  Sore Throat  This is a new problem. The current episode started in the past 7 days. The problem has been unchanged. There has been no fever. Associated symptoms include congestion and coughing. Pertinent negatives include no shortness of breath or vomiting.  Sinus Problem This is a new problem. The current episode started in the past 7 days. The problem is unchanged. There has been no fever. The pain is mild. Associated symptoms include congestion, coughing, sinus pressure and a sore throat. Pertinent negatives include no chills or shortness of breath.    Review of Systems  Constitutional:  Negative for chills, fatigue and fever.  HENT:  Positive for congestion, sinus pressure and sore throat.   Respiratory:  Positive for cough. Negative for chest tightness, shortness of breath and wheezing.   Cardiovascular:  Negative for chest pain.  Gastrointestinal:  Negative for nausea and vomiting.  Psychiatric/Behavioral:  Positive for sleep disturbance. Negative for dysphoric mood. The patient is not nervous/anxious.      Lab Results  Component Value Date   NA 139 12/19/2023   K 5.1 12/19/2023   CO2 22 12/19/2023   GLUCOSE 86 12/19/2023   BUN 21 12/19/2023   CREATININE 0.93 12/19/2023   CALCIUM  10.0 12/19/2023   EGFR 67 12/19/2023   GFRNONAA >60 02/12/2023   Lab Results  Component Value Date   CHOL 191 05/18/2023   HDL 43 05/18/2023   LDLCALC 120 (H) 05/18/2023   LDLDIRECT 131.0 10/31/2016   TRIG 157 (H) 05/18/2023   CHOLHDL 4.4 05/18/2023   Lab Results  Component Value Date   TSH 2.260 05/18/2023   Lab Results  Component Value Date   HGBA1C 6.0 (A) 01/25/2024   Lab Results  Component Value Date   WBC 13.8 (H) 12/19/2023   HGB  16.9 (H) 12/19/2023   HCT 50.7 (H) 12/19/2023   MCV 93 12/19/2023   PLT 413 12/19/2023   Lab Results  Component Value Date   ALT 22 05/18/2023   AST 17 05/18/2023   ALKPHOS 87 05/18/2023   BILITOT 0.4 05/18/2023   Lab Results  Component Value Date   VD25OH 38.71 10/31/2016     Patient Active Problem List   Diagnosis Date Noted   Mucous cyst of digit of left hand 05/18/2023   Peroneal tendinitis, left 10/13/2022   Left lumbar radiculopathy 07/26/2022   Arthralgia of left knee 07/26/2022   Pes anserinus bursitis of right knee 04/19/2022   Osteoarthritis of both knees 03/27/2022   Greater trochanteric pain syndrome of both lower extremities 03/27/2022   It band syndrome, right 03/27/2022   Hyperlipidemia associated with type 2 diabetes mellitus (HCC) 05/12/2021   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   Varicose veins of leg with pain, bilateral 07/06/2020   Type II diabetes mellitus with complication (HCC) 01/19/2020   Gastric polyp    Colon polyps    Varicose veins of both lower extremities 10/20/2019   Dysphagia 10/20/2019   Osteoarthritis of right hand 10/31/2016   Nonischemic cardiomyopathy (HCC) 04/14/2016   BMI 33.0-33.9,adult 04/14/2016   Persistent atrial fibrillation (HCC) 04/12/2016    No Known Allergies  Past Surgical History:  Procedure Laterality Date   ATRIAL FIBRILLATION ABLATION N/A 12/09/2020  Procedure: ATRIAL FIBRILLATION ABLATION;  Surgeon: Cindie Ole DASEN, MD;  Location: [facility] INVASIVE CV LAB;  Service: Cardiovascular;  Laterality: N/A;   BIOPSY N/A 01/12/2020   Procedure: BIOPSY;  Surgeon: Janalyn Keene NOVAK, MD;  Location: [facility] [place] [facility];  Service: Endoscopy;  Laterality: N/A;   CARDIAC CATHETERIZATION N/A 04/17/2016   Procedure: Left Heart Cath and Coronary Angiography;  Surgeon: Deatrice DELENA Cage, MD;  Location: [facility] INVASIVE CV LAB;  Service: Cardiovascular;  Laterality: N/A;   CARDIOVERSION N/A 10/22/2020   Procedure:  CARDIOVERSION;  Surgeon: Mady Bruckner, MD;  Location: [facility] ORS;  Service: Cardiovascular;  Laterality: N/A;   CARDIOVERSION N/A 01/18/2024   Procedure: CARDIOVERSION;  Surgeon: Shlomo Wilbert SAUNDERS, MD;  Location: [facility] INVASIVE CV LAB;  Service: Cardiovascular;  Laterality: N/A;   CHOLECYSTECTOMY     COLON SURGERY  2021   COLONOSCOPY N/A 01/20/2020   Procedure: COLONOSCOPY;  Surgeon: Maryruth Ole DASEN, MD;  Location: [facility] ENDOSCOPY;  Service: Endoscopy;  Laterality: N/A;   COLONOSCOPY WITH PROPOFOL  N/A 01/12/2020   Procedure: COLONOSCOPY WITH PROPOFOL ;  Surgeon: Janalyn Keene NOVAK, MD;  Location: [facility];  Service: Endoscopy;  Laterality: N/A;  Diabetic - oral meds sleep apnea   ELECTROPHYSIOLOGIC STUDY N/A 05/26/2016   Procedure: Cardioversion;  Surgeon: Deatrice DELENA Cage, MD;  Location: [facility] ORS;  Service: Cardiovascular;  Laterality: N/A;   ESOPHAGOGASTRODUODENOSCOPY (EGD) WITH PROPOFOL  N/A 01/12/2020   Procedure: ESOPHAGOGASTRODUODENOSCOPY (EGD) WITH PROPOFOL ;  Surgeon: Janalyn Keene NOVAK, MD;  Location: [facility];  Service: Endoscopy;  Laterality: N/A;   POLYPECTOMY N/A 01/12/2020   Procedure: POLYPECTOMY;  Surgeon: Janalyn Keene NOVAK, MD;  Location: [facility];  Service: Endoscopy;  Laterality: N/A;    Social History   Tobacco Use   Smoking status: Never    Passive exposure: Never   Smokeless tobacco: Never  Vaping Use   Vaping status: Never Used  Substance Use Topics   Alcohol use: Never   Drug use: Never     Medication list has been reviewed and updated.  Current Meds  Medication Sig   azithromycin  (ZITHROMAX  Z-PAK) 250 MG tablet UAD   fluticasone (FLONASE) 50 MCG/ACT nasal spray Place 2 sprays into both nostrils daily.       03/14/2024   11:10 AM 01/25/2024    9:35 AM 09/14/2023    9:50 AM 05/18/2023    9:41 AM  GAD 7 : Generalized Anxiety Score  Nervous, Anxious, on Edge 0 2 1 1   Control/stop worrying 0 1 0 0  Worry too much - different  things 0 1 0 0  Trouble relaxing 0 1 0 0  Restless 0 1 0 0  Easily annoyed or irritable 0 0 0 0  Afraid - awful might happen 0 1 0 0  Total GAD 7 Score 0 7 1 1   Anxiety Difficulty Not difficult at all Somewhat difficult Not difficult at all Not difficult at all       03/14/2024   11:10 AM 02/07/2024    3:22 PM 01/25/2024    9:34 AM  Depression screen PHQ 2/9  Decreased Interest 0 0 1  Down, Depressed, Hopeless 0 0 1  PHQ - 2 Score 0 0 2  Altered sleeping 1  2  Tired, decreased energy 1  2  Change in appetite 0  2  Feeling bad or failure about yourself  0  0  Trouble concentrating 0  1  Moving slowly or fidgety/restless 0  1  Suicidal thoughts 0  0  PHQ-9 Score 2  10  Difficult doing work/chores Not difficult at all  Somewhat difficult    BP Readings from Last 3 Encounters:  03/14/24 126/74  02/20/24 114/72  01/25/24 116/68    Physical Exam Constitutional:      Appearance: She is well-developed.  HENT:     Right Ear: Ear canal and external ear normal. Tympanic membrane is retracted. Tympanic membrane is not erythematous.     Left Ear: Ear canal and external ear normal. Tympanic membrane is retracted. Tympanic membrane is not erythematous.     Nose:     Right Sinus: Maxillary sinus tenderness present. No frontal sinus tenderness.     Left Sinus: Maxillary sinus tenderness present. No frontal sinus tenderness.     Mouth/Throat:     Mouth: No oral lesions.     Pharynx: Uvula midline. Pharyngeal swelling and posterior oropharyngeal erythema present. No oropharyngeal exudate.  Cardiovascular:     Rate and Rhythm: Normal rate and regular rhythm.     Heart sounds: Normal heart sounds.  Pulmonary:     Effort: Pulmonary effort is normal.     Breath sounds: Normal breath sounds. No decreased breath sounds, wheezing or rales.  Lymphadenopathy:     Cervical: No cervical adenopathy.  Neurological:     Mental Status: She is alert and oriented to person, place, and time.      Wt Readings from Last 3 Encounters:  03/14/24 235 lb (106.6 kg)  02/20/24 238 lb 3.2 oz (108 kg)  01/25/24 236 lb (107 kg)    BP 126/74   Pulse 74   Temp 97.7 F (36.5 C) (Oral)   Ht 5' 7 (1.702 m)   Wt 235 lb (106.6 kg)   SpO2 97%   BMI 36.81 kg/m   Assessment and Plan:  Problem List Items Addressed This Visit   None Visit Diagnoses       Acute non-recurrent maxillary sinusitis    -  Primary   continue warm liquids, essential oils for comfort treat with Flonase NS daily and Zpak follow up if no improvement   Relevant Medications   fluticasone (FLONASE) 50 MCG/ACT nasal spray   azithromycin  (ZITHROMAX  Z-PAK) 250 MG tablet       No follow-ups on file.    Leita HILARIO Adie, MD [facility] [place]

## 2024-03-18 ENCOUNTER — Ambulatory Visit: Attending: Family Medicine

## 2024-03-18 DIAGNOSIS — M25551 Pain in right hip: Secondary | ICD-10-CM | POA: Insufficient documentation

## 2024-03-18 DIAGNOSIS — R2681 Unsteadiness on feet: Secondary | ICD-10-CM | POA: Diagnosis not present

## 2024-03-18 DIAGNOSIS — R262 Difficulty in walking, not elsewhere classified: Secondary | ICD-10-CM | POA: Insufficient documentation

## 2024-03-18 DIAGNOSIS — M25562 Pain in left knee: Secondary | ICD-10-CM | POA: Insufficient documentation

## 2024-03-18 DIAGNOSIS — G8929 Other chronic pain: Secondary | ICD-10-CM | POA: Insufficient documentation

## 2024-03-18 DIAGNOSIS — M6281 Muscle weakness (generalized): Secondary | ICD-10-CM | POA: Diagnosis not present

## 2024-03-18 NOTE — Therapy (Signed)
 OUTPATIENT PHYSICAL THERAPY ANKLE/KNEE/HIP TREATMENT   Patient Name: Brandy [name] MRN: [id number] DOB:[birth year], 68 y.o., female Today's Date: 03/18/2024  END OF SESSION:  PT End of Session - 03/18/24 1044     Visit Number 31    Number of Visits 73    Date for Recertification  05/27/24    Authorization Type eval: 06/08/23    PT Start Time 1015    PT Stop Time 1100    PT Time Calculation (min) 45 min    Activity Tolerance Patient tolerated treatment well;Patient limited by fatigue    Behavior During Therapy [facility] for tasks assessed/performed          Past Medical History:  Diagnosis Date   Acute lower GI bleeding 01/19/2020   Arthritis    Atrial fibrillation with rapid ventricular response (HCC)    Chicken pox    Chronic systolic CHF (congestive heart failure) (HCC)    a. 03/2016 Echo: Ef 15-20%, diff HK, ant AK;  b. 05/2016 Echo: Ef 30-35%, diff HK, mildly dil LA/RA.   Diabetes mellitus without complication (HCC)    Dysrhythmia    GERD (gastroesophageal reflux disease)    Heart murmur    Hip discomfort    been going on for 40 years    History of kidney stones    Hyperlipidemia associated with type 2 diabetes mellitus (HCC) 05/12/2021   Hypertension    Moderate mitral regurgitation    a. 03/2016 Echo: mod MR in setting of LV dysfxn.   Motion sickness    car - back seat   NICM (nonischemic cardiomyopathy) (HCC)    a. 03/2016 Echo: EF 15-20%, diff HK, ant AK, mod MR, mod dil LA, mildly dil RA;  b. 04/2016 Cath: nl cors;  c. 05/2016 Echo: EF 30-35%, diff HK.   Obesity    Obstructive sleep apnea    compliant with CPAP   Persistent atrial fibrillation (HCC)    a. Dx 03/2016;  b. CHA2DS2VASc = 2-->Eliquis  5mg  BID;  b. 05/2016 Failed DCCV x 4.   Sleep apnea    Stomach irritation    Visit for monitoring Tikosyn  therapy 07/24/2016   Past Surgical History:  Procedure Laterality Date   ATRIAL FIBRILLATION ABLATION N/A 12/09/2020   Procedure: ATRIAL FIBRILLATION ABLATION;   Surgeon: Cindie Ole ONEIDA, MD;  Location: [facility];  Service: Cardiovascular;  Laterality: N/A;   BIOPSY N/A 01/12/2020   Procedure: BIOPSY;  Surgeon: Janalyn Keene NOVAK, MD;  Location: [facility];  Service: Endoscopy;  Laterality: N/A;   CARDIAC CATHETERIZATION N/A 04/17/2016   Procedure: Left Heart Cath and Coronary Angiography;  Surgeon: Deatrice DELENA Cage, MD;  Location: [facility];  Service: Cardiovascular;  Laterality: N/A;   CARDIOVERSION N/A 10/22/2020   Procedure: CARDIOVERSION;  Surgeon: Mady Bruckner, MD;  Location: [facility] OR[facility];  Service: Cardiovascular;  Laterality: N/A;   CARDIOVERSION N/A 01/18/2024   Procedure: CARDIOVERSION;  Surgeon: Shlomo Wilbert SAUNDERS, MD;  Location: [facility];  Service: Cardiovascular;  Laterality: N/A;   CHOLECYSTECTOMY     COLON SURGERY  2021   COLONOSCOPY N/A 01/20/2020   Procedure: COLONOSCOPY;  Surgeon: Maryruth Ole ONEIDA, MD;  Location: [facility];  Service: Endoscopy;  Laterality: N/A;   COLONOSCOPY WITH PROPOFOL  N/A 01/12/2020   Procedure: COLONOSCOPY WITH PROPOFOL ;  Surgeon: Janalyn Keene NOVAK, MD;  Location: [facility];  Service: Endoscopy;  Laterality: N/A;  Diabetic - oral meds sleep apnea   ELECTROPHYSIOLOGIC STUDY N/A 05/26/2016  Procedure: Cardioversion;  Surgeon: Deatrice DELENA Cage, MD;  Location: [facility] OR[facility];  Service: Cardiovascular;  Laterality: N/A;   ESOPHAGOGASTRODUODENOSCOPY (EGD) WITH PROPOFOL  N/A 01/12/2020   Procedure: ESOPHAGOGASTRODUODENOSCOPY (EGD) WITH PROPOFOL ;  Surgeon: Janalyn Keene NOVAK, MD;  Location: [facility];  Service: Endoscopy;  Laterality: N/A;   POLYPECTOMY N/A 01/12/2020   Procedure: POLYPECTOMY;  Surgeon: Janalyn Keene NOVAK, MD;  Location: [facility];  Service: Endoscopy;  Laterality: N/A;   Patient Active Problem List   Diagnosis Date Noted   Mucous cyst of digit of left hand 05/18/2023   Peroneal tendinitis, left 10/13/2022   Left lumbar  radiculopathy 07/26/2022   Arthralgia of left knee 07/26/2022   Pes anserinus bursitis of right knee 04/19/2022   Osteoarthritis of both knees 03/27/2022   Greater trochanteric pain syndrome of both lower extremities 03/27/2022   It band syndrome, right 03/27/2022   Hyperlipidemia associated with type 2 diabetes mellitus (HCC) 05/12/2021   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   Varicose veins of leg with pain, bilateral 07/06/2020   Type II diabetes mellitus with complication (HCC) 01/19/2020   Gastric polyp    Colon polyps    Varicose veins of both lower extremities 10/20/2019   Dysphagia 10/20/2019   Osteoarthritis of right hand 10/31/2016   Nonischemic cardiomyopathy (HCC) 04/14/2016   BMI 33.0-33.9,adult 04/14/2016   Persistent atrial fibrillation (HCC) 04/12/2016   PCP: Leita Adie, MD  REFERRING PROVIDER: Selinda Ku, MD  REFERRING DIAG:  [phone] (ICD-10-CM) - Peroneal tendinitis, left  M54.16 (ICD-10-CM) - Left lumbar radiculopathy  M25.562 (ICD-10-CM) - Arthralgia of left knee  M17.11 (ICD-10-CM) - Primary osteoarthritis of right knee  M76.31 (ICD-10-CM) - It band syndrome, right  M25.551 (ICD-10-CM) - Greater 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   Rationale for Evaluation and Treatment: Rehabilitation  THERAPY DIAG: Muscle weakness (generalized)  Pain in right hip  Chronic pain of left knee  Difficulty in walking, not elsewhere classified  ONSET DATE: Chronic, aggravated over Christmas (December 2024)  FOLLOW-UP APPT SCHEDULED WITH REFERRING PROVIDER: No, initially no but now has appt for July;  INITIAL EVALUATION: SUBJECTIVE:                                                                                                                                                                                         SUBJECTIVE STATEMENT:  Left ankle and foot pain  PERTINENT HISTORY: Pt reports pain in left ankle that seems to have  flared up over Christmas after pt reported excess walking. Pt reports feeling pulling and burning at the base of the lateral ankle. She has previously been to PT for pain  in her left hip and knee but has now noticed increased pain in the left ankle/foot. Pt expresses difficulty with exercises; specifically pressure on the toes, stretching calves, and rocking on toes. Pt notes pain is as high as 5/10 NPS but is constant and never really goes away, with lowest pain at 1/10 NPS.  PAIN:    Pain Intensity: Present: 1/10, Best: 1/10, Worst: 5/10 Pain location: Lateral foot and ankle Pain Quality: constant and burning  Radiating: Yes  Swelling: No  Popping, catching, locking: No  Numbness/Tingling: No Focal Weakness: Yes Aggravating factors: Touching lateral ankle, moving the ankle, ascending steps when leading with left foot, walking Relieving factors: Stopping activity, resting, brace temporarily relieves some pain 24-hour pain behavior: No night pain when sleeping on back, stretching in AM helps but feels unstable, PM pain depends on activity during the day History of prior back, hip, knee, or ankle injury, pain, surgery, or therapy: Yes; chronic hip, knee, foot pain Dominant hand: right Imaging: No  Typical footwear: Sneakers or rubber boots Red flags: Negative for personal history of cancer, chills/fever, night sweats, nausea, vomiting, unrelenting pain): Negative  PRECAUTIONS: None  WEIGHT BEARING RESTRICTIONS: No  FALLS: Has patient fallen in last 6 months? 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  OBJECTIVE:   Patient Surveys  08/14/23: FADI: FADI Activities Score: 80 / 104 or 77%  Cognition Patient is oriented to person, place, and time.  Recent memory is intact.  Remote memory is intact.  Attention span and concentration are intact.  Expressive speech is intact.  Patient's fund of knowledge is within normal limits for educational level.    Gross Musculoskeletal Assessment Bulk: Normal Tone: Normal No trophic changes noted to foot/ankle. No ecchymosis, erythema, or edema noted. No gross ankle/foot deformity noted  GAIT: Comments: Mild toe out gait on right foot. Antalgic gait noted on left side.  Posture: No noted discrepancies.  AROM AROM (Normal range in degrees) AROM       Hip Right Left  Flexion (125) WNL WNL  Extension (15) WNL WNL  Abduction (40) WNL WNL  Adduction  WNL WNL  Internal Rotation (45) WNL WNL  External Rotation (45) WNL WNL      Knee    Flexion (135) WNL WNL  Extension (0) WNL WNL      Ankle    Dorsiflexion (20) 10 (WB) 10 (WB)  Plantarflexion (50) 40 35  Inversion (35) 30 20  Eversion (15) 7 8  (* = pain; Blank rows = not tested)  LE MMT: MMT (out of 5) Right  Left   Hip flexion 4 4  Hip extension 4 4  Hip abduction 4 4+  Hip adduction 4+ 4  Hip internal rotation 5 5  Hip external rotation 5 5  Knee flexion 4 4  Knee extension 5 5  Ankle dorsiflexion 5 5  Ankle plantarflexion    Ankle inversion 5 5*  Ankle eversion 5 5*  (* = pain; Blank rows =  not tested)  Sensation Diminished sensation left S1 and lateral aspect of left foot.  Reflexes Deferred  Palpation Location LEFT  RIGHT           Plantar fascia 1 0  Dorsal aspect of foot 1 0  Tarsal tunnel 1 0  (Blank rows = not tested) Graded on 0-4 scale (0 = no pain, 1 = pain, 2 =  pain with wincing/grimacing/flinching, 3 = pain with withdrawal, 4 = unwilling to allow palpation), (Blank rows = not tested)  Passive Accessory Motion Superior Tibiofibular Joint: WNL Inferior Tibiofibular Joint: WNL Talocrural Joint Distraction: WNL but noted pain with pulling feeling with left foot Talocrural Joint AP: L hypomobile, painful where hand is purchased Talocrural Joint PA: L hypomobile  SPECIAL TESTS Ligamentous Integrity Anterior Drawer (ATF, 10-15 plantarflexion with anterior translation): Negative Talar Tilt (CFL, inversion): Negative  Updated outcome measures (03/04/24): HAGOS Score: 37.3% (Symptoms Score: 46.4%; Pain Score: 42.5%; Physical Function, Daily Living Score: 40%; Function, Sports, and Recreational Activities Score: 37.5%; Participation in Physical Activities Score: 37.5%; Quality of Life Score: 20 %)  TODAY'S TREATMENT: 03/18/2024    Subjective: Reports ongoing L knee pain but no resting knee pain today. Pt states that she hasn't done too much recently due to having a sinus infection the past week and over the weekend so she hasn't had too much left knee or right hip pain (unless something is directly compressing the hip). Pt does report residual headache on this day from her cold. No specific questions upon arrival.   PAIN: Denies L knee pain or R hip pain at rest    Ther-ex  NuStep L2-5 x 10 minutes for BLE strengthening and L knee ROM during interval history; Supine SLR with 3# AW and light manual resistance from therapist 2 x 12 BLE; Supine Bridge against blue TB 2 x 10; Supine Bridge marches 2 x 15 BLE;  Supine SAQ over bolster with manual resistance 2 x 15  BLE; Sidelying hip abduction with manual resistance x 10 BLE; Hip flexion marches x 15 BLE; Standing HS curls, 4# AW x 15 BLE; TRX Reverse lunges 1 x 8 BLE;  TRX Squats to gray chair height, 2 x 8;  Resisted side stepping with blue tband around ankles 10' x multiple bouts;  Not today:   Prone L knee hamstring curls with manual resistance from therapist 2 x 10; Prone LLE straight knee hip extension 2 x 10; Prone L hip ER with knee at 90 flexion and manual resistance from therapist 2 x 10; Pball bridges with arms in front of chest 2 x 10; Standing hip strengthening with 4# ankle weights: Supine straight leg hip abduction/adduction with manual resistance 2 x 10 BLE; Heel slides with resisted extension 2 x 10 BLE; Total Gym L20 double leg partial squats 2 x 15; TG L20 double leg heel raises 2 x 15; Sidelying reverse clams with manual resistance x 10 BLE; Sidelying clams with manual resistance x 10 BLE;  PATIENT EDUCATION:  Education details: Pt educated throughout session about proper posture and technique with exercises. Improved exercise technique, movement at target joints, use of target muscles after min to mod verbal, visual, tactile cues.  Person educated: Patient Education method: Explanation, Actor cues, Verbal cues, and Handouts Education comprehension: verbalized understanding and returned demonstration   HOME EXERCISE PROGRAM:  Access Code: [email or web address] URL: [email or web address] Date: 12/11/2023 Prepared by: Selinda Eck  Exercises - Seated Ankle Eversion with Resistance  - 1 x daily - 7 x weekly -  2 sets - 10 reps - 3s hold - Seated Ankle Inversion with Resistance and Legs Crossed (Mirrored)  - 1 x daily - 7 x weekly - 2 sets - 10 reps - 3s hold - Seated Ankle Plantarflexion with Resistance  - 1 x daily - 7 x weekly - 2 sets - 10 reps - 3s hold - Long Sitting Calf Stretch with Strap  - 1 x daily - 7 x weekly - 3 reps - 30-45s hold - Standing Gastroc Stretch on Step  - 1 x daily - 7 x weekly - 3 sets - 10 reps - Standing Dorsiflexion Self-Mobilization on Step  - 1 x daily - 7 x weekly - 3 sets - 10 reps - Standing Dorsiflexion Self-Mobilization on Step (Mirrored)  - 1 x daily - 7 x weekly - 3 sets - 10 reps - Hooklying Isometric Hip Abduction  with Belt  - 1 x daily - 7 x weekly - 2 sets - 5 reps - 30s hold/30s relax hold   ASSESSMENT:  CLINICAL IMPRESSION: Pt continues to report ongoing, intermittent and activity dependent L foot, L ankle, L knee, and L hip pain. Pt was progressed to completing supine bridges against blue TB and supine SLR with 3# and light manual resistance on this day which she tolerated well. Pt noted to experience significant fatigue with supine SLR and sidelying hip abduction exercises and was unable to perform the exercise for the entire intended number of reps secondary to bilateral hip fatigue. Pt reported slight increase in anterior left knee pain with TRX reverse lunges so PT terminated exercise after one set. No HEP updates at this time. Pt reported reduction in left knee pain at end of tx session. Pt will continue to benefit from PT services to address deficits in strength and pain in order to improve pain-free function at home and with leisure activities.   OBJECTIVE IMPAIRMENTS: decreased strength and hypomobility.   ACTIVITY LIMITATIONS: stairs and locomotion level  PARTICIPATION LIMITATIONS: occupation and yard work  PERSONAL FACTORS: Age, Behavior pattern, Past/current experiences, Time since onset of injury/illness/exacerbation, and 1-2 comorbidities: type II diabetes and left lumbar radiculopathy are also affecting patient's functional outcome.   REHAB POTENTIAL: Good  CLINICAL DECISION MAKING: Evolving/moderate complexity  EVALUATION COMPLEXITY: Moderate   GOALS: Goals reviewed with patient? Yes  SHORT TERM GOALS:   Pt will be independent with HEP to improve strength and decrease ankle pain to improve pain-free function at home and work. Baseline:  Goal status: ACHIEVED   LONG TERM GOALS: Target date: 03/04/2024   1.  Pt will decrease worst ankle pain by at least 3 points on the NPRS in order to demonstrate clinically significant reduction in ankle pain. Baseline: 06/08/23: 1/10  best, 5/10 worst; 08/14/23: worst: 1/10; 03/04/24: 2/10; Goal status: ACHIEVED  2.  Pt will increase FADI score by at least 4 points in order demonstrate clinically significant reduction in ankle pain/disability.       Baseline: 06/13/23: 65%; 08/14/23: 80 / 104 or 77% Goal status: ACHIEVED  3.  Pt will improve strength of ankle inversion/eversion with limited to no pain in order to demonstrate improvement in strength and function.  Baseline: 06/08/23: 5/5 with pain; 08/14/23: 5/5 without pain Goal status: ACHIEVED  4.  Pt will report at least 50% improvement in L ankle and knee symptoms in order to demonstrate improvement in her ability to work in the yard/garden with less pain.  Baseline: 08/14/23: 75% improvement Goal status: ACHIEVED  5.  Pt will decrease HAGOS score by at least 20 points in order demonstrate clinically significant reduction in hip pain/disability.  Baseline: 08/14/23: HAGOS Score: 37.7%, 12/11/23: 44.3%; 03/04/24: 37.3% Goal status: ONGOING  6.  Pt will decrease worst posterior R hip pain by at least 3 points on the NPRS in order to demonstrate clinically significant reduction in hip pain. Baseline: 08/14/23: worst: 8/10; 12/11/23: 2/10; 03/04/24: 5/10; Goal status: ACHIEVED   PLAN: PT FREQUENCY: 1x/week  PT DURATION: 12 weeks  PLANNED INTERVENTIONS: Therapeutic exercises, Therapeutic activity, Neuromuscular re-education, Balance training, Gait training, Patient/Family education, Self Care, Joint mobilization, Joint manipulation, Vestibular training, Canalith repositioning, Orthotic/Fit training, DME instructions, Dry Needling, Electrical stimulation, Spinal manipulation, Spinal mobilization, Cryotherapy, Moist heat, Taping, Traction, Ultrasound, Ionotophoresis 4mg /ml Dexamethasone , Manual therapy, and Re-evaluation.  PLAN FOR NEXT SESSION: strengthening and mobility exercises focused on R hip pain, review and modify HEP as necessary  Curtistine Bracket, SPT  Jason D Huprich PT,  DPT, GCS  2:05 PM,03/18/24

## 2024-03-24 ENCOUNTER — Ambulatory Visit

## 2024-03-24 ENCOUNTER — Encounter

## 2024-03-24 DIAGNOSIS — I428 Other cardiomyopathies: Secondary | ICD-10-CM

## 2024-03-25 ENCOUNTER — Ambulatory Visit

## 2024-03-25 DIAGNOSIS — M6281 Muscle weakness (generalized): Secondary | ICD-10-CM

## 2024-03-25 DIAGNOSIS — R2681 Unsteadiness on feet: Secondary | ICD-10-CM | POA: Diagnosis not present

## 2024-03-25 DIAGNOSIS — M25551 Pain in right hip: Secondary | ICD-10-CM | POA: Diagnosis not present

## 2024-03-25 DIAGNOSIS — R262 Difficulty in walking, not elsewhere classified: Secondary | ICD-10-CM | POA: Diagnosis not present

## 2024-03-25 DIAGNOSIS — M25562 Pain in left knee: Secondary | ICD-10-CM | POA: Diagnosis not present

## 2024-03-25 DIAGNOSIS — G8929 Other chronic pain: Secondary | ICD-10-CM

## 2024-03-25 LAB — CUP PACEART REMOTE DEVICE CHECK
Date Time Interrogation Session: 20251109232344
Implantable Pulse Generator Implant Date: 20230928

## 2024-03-25 NOTE — Therapy (Signed)
 OUTPATIENT PHYSICAL THERAPY ANKLE/KNEE/HIP TREATMENT   Patient Name: Brandy [name] MRN: [id number] DOB:03/05/1956, 68 y.o., female Today's Date: 03/25/2024  END OF SESSION:  PT End of Session - 03/25/24 0929     Visit Number 32    Number of Visits 73    Date for Recertification  05/27/24    Authorization Type eval: 06/08/23    PT Start Time 0930    PT Stop Time 1015    PT Time Calculation (min) 45 min    Activity Tolerance Patient tolerated treatment well    Behavior During Therapy [facility] for tasks assessed/performed         Past Medical History:  Diagnosis Date   Acute lower GI bleeding 01/19/2020   Arthritis    Atrial fibrillation with rapid ventricular response (HCC)    Chicken pox    Chronic systolic CHF (congestive heart failure) (HCC)    a. 03/2016 Echo: Ef 15-20%, diff HK, ant AK;  b. 05/2016 Echo: Ef 30-35%, diff HK, mildly dil LA/RA.   Diabetes mellitus without complication (HCC)    Dysrhythmia    GERD (gastroesophageal reflux disease)    Heart murmur    Hip discomfort    been going on for 40 years    History of kidney stones    Hyperlipidemia associated with type 2 diabetes mellitus (HCC) 05/12/2021   Hypertension    Moderate mitral regurgitation    a. 03/2016 Echo: mod MR in setting of LV dysfxn.   Motion sickness    car - back seat   NICM (nonischemic cardiomyopathy) (HCC)    a. 03/2016 Echo: EF 15-20%, diff HK, ant AK, mod MR, mod dil LA, mildly dil RA;  b. 04/2016 Cath: nl cors;  c. 05/2016 Echo: EF 30-35%, diff HK.   Obesity    Obstructive sleep apnea    compliant with CPAP   Persistent atrial fibrillation (HCC)    a. Dx 03/2016;  b. CHA2DS2VASc = 2-->Eliquis  5mg  BID;  b. 05/2016 Failed DCCV x 4.   Sleep apnea    Stomach irritation    Visit for monitoring Tikosyn  therapy 07/24/2016   Past Surgical History:  Procedure Laterality Date   ATRIAL FIBRILLATION ABLATION N/A 12/09/2020   Procedure: ATRIAL FIBRILLATION ABLATION;  Surgeon: Cindie Ole ONEIDA, MD;   Location: [facility];  Service: Cardiovascular;  Laterality: N/A;   BIOPSY N/A 01/12/2020   Procedure: BIOPSY;  Surgeon: Janalyn Keene NOVAK, MD;  Location: [facility];  Service: Endoscopy;  Laterality: N/A;   CARDIAC CATHETERIZATION N/A 04/17/2016   Procedure: Left Heart Cath and Coronary Angiography;  Surgeon: Deatrice DELENA Cage, MD;  Location: [facility];  Service: Cardiovascular;  Laterality: N/A;   CARDIOVERSION N/A 10/22/2020   Procedure: CARDIOVERSION;  Surgeon: Mady Bruckner, MD;  Location: [facility];  Service: Cardiovascular;  Laterality: N/A;   CARDIOVERSION N/A 01/18/2024   Procedure: CARDIOVERSION;  Surgeon: Shlomo Wilbert SAUNDERS, MD;  Location: [facility];  Service: Cardiovascular;  Laterality: N/A;   CHOLECYSTECTOMY     COLON SURGERY  2021   COLONOSCOPY N/A 01/20/2020   Procedure: COLONOSCOPY;  Surgeon: Maryruth Ole ONEIDA, MD;  Location: [facility];  Service: Endoscopy;  Laterality: N/A;   COLONOSCOPY WITH PROPOFOL  N/A 01/12/2020   Procedure: COLONOSCOPY WITH PROPOFOL ;  Surgeon: Janalyn Keene NOVAK, MD;  Location: [facility];  Service: Endoscopy;  Laterality: N/A;  Diabetic - oral meds sleep apnea   ELECTROPHYSIOLOGIC STUDY N/A 05/26/2016   Procedure: Cardioversion;  Surgeon:  Deatrice DELENA Cage, MD;  Location: [facility];  Service: Cardiovascular;  Laterality: N/A;   ESOPHAGOGASTRODUODENOSCOPY (EGD) WITH PROPOFOL  N/A 01/12/2020   Procedure: ESOPHAGOGASTRODUODENOSCOPY (EGD) WITH PROPOFOL ;  Surgeon: Janalyn Keene NOVAK, MD;  Location: [facility];  Service: Endoscopy;  Laterality: N/A;   POLYPECTOMY N/A 01/12/2020   Procedure: POLYPECTOMY;  Surgeon: Janalyn Keene NOVAK, MD;  Location: [facility];  Service: Endoscopy;  Laterality: N/A;   Patient Active Problem List   Diagnosis Date Noted   Mucous cyst of digit of left hand 05/18/2023   Peroneal tendinitis, left 10/13/2022   Left lumbar radiculopathy 07/26/2022   Arthralgia of  left knee 07/26/2022   Pes anserinus bursitis of right knee 04/19/2022   Osteoarthritis of both knees 03/27/2022   Greater trochanteric pain syndrome of both lower extremities 03/27/2022   It band syndrome, right 03/27/2022   Hyperlipidemia associated with type 2 diabetes mellitus (HCC) 05/12/2021   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   Varicose veins of leg with pain, bilateral 07/06/2020   Type II diabetes mellitus with complication (HCC) 01/19/2020   Gastric polyp    Colon polyps    Varicose veins of both lower extremities 10/20/2019   Dysphagia 10/20/2019   Osteoarthritis of right hand 10/31/2016   Nonischemic cardiomyopathy (HCC) 04/14/2016   BMI 33.0-33.9,adult 04/14/2016   Persistent atrial fibrillation (HCC) 04/12/2016   PCP: Leita Adie, MD  REFERRING PROVIDER: Selinda Ku, MD  REFERRING DIAG:  [phone] (ICD-10-CM) - Peroneal tendinitis, left  M54.16 (ICD-10-CM) - Left lumbar radiculopathy  M25.562 (ICD-10-CM) - Arthralgia of left knee  M17.11 (ICD-10-CM) - Primary osteoarthritis of right knee  M76.31 (ICD-10-CM) - It band syndrome, right  M25.551 (ICD-10-CM) - Greater 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   Rationale for Evaluation and Treatment: Rehabilitation  THERAPY DIAG: Muscle weakness (generalized)  Pain in right hip  Chronic pain of left knee  ONSET DATE: Chronic, aggravated over Christmas (December 2024)  FOLLOW-UP APPT SCHEDULED WITH REFERRING PROVIDER: No, initially no but now has appt for July;  INITIAL EVALUATION: SUBJECTIVE:                                                                                                                                                                                         SUBJECTIVE STATEMENT:  Left ankle and foot pain  PERTINENT HISTORY: Pt reports pain in left ankle that seems to have flared up over Christmas after pt reported excess walking. Pt reports feeling pulling and  burning at the base of the lateral ankle. She has previously been to PT for pain in her left hip and knee but has now noticed increased  pain in the left ankle/foot. Pt expresses difficulty with exercises; specifically pressure on the toes, stretching calves, and rocking on toes. Pt notes pain is as high as 5/10 NPS but is constant and never really goes away, with lowest pain at 1/10 NPS.  PAIN:    Pain Intensity: Present: 1/10, Best: 1/10, Worst: 5/10 Pain location: Lateral foot and ankle Pain Quality: constant and burning  Radiating: Yes  Swelling: No  Popping, catching, locking: No  Numbness/Tingling: No Focal Weakness: Yes Aggravating factors: Touching lateral ankle, moving the ankle, ascending steps when leading with left foot, walking Relieving factors: Stopping activity, resting, brace temporarily relieves some pain 24-hour pain behavior: No night pain when sleeping on back, stretching in AM helps but feels unstable, PM pain depends on activity during the day History of prior back, hip, knee, or ankle injury, pain, surgery, or therapy: Yes; chronic hip, knee, foot pain Dominant hand: right Imaging: No  Typical footwear: Sneakers or rubber boots Red flags: Negative for personal history of cancer, chills/fever, night sweats, nausea, vomiting, unrelenting pain): Negative  PRECAUTIONS: None  WEIGHT BEARING RESTRICTIONS: No  FALLS: Has patient fallen in last 6 months? 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  OBJECTIVE:   Patient Surveys  08/14/23: FADI: FADI Activities Score: 80 / 104 or 77%  Cognition Patient is oriented to person, place, and time.  Recent memory is intact.  Remote memory is intact.  Attention span and concentration are intact.  Expressive speech is intact.  Patient's fund of knowledge is within normal limits for educational level.    Gross Musculoskeletal Assessment Bulk: Normal Tone: Normal No trophic changes noted to foot/ankle. No ecchymosis, erythema, or edema noted. No gross ankle/foot deformity noted  GAIT: Comments: Mild toe out gait on right foot. Antalgic gait noted on left side.  Posture: No noted discrepancies.  AROM AROM (Normal range in degrees) AROM       Hip Right Left  Flexion (125) WNL WNL  Extension (15) WNL WNL  Abduction (40) WNL WNL  Adduction  WNL WNL  Internal Rotation (45) WNL WNL  External Rotation (45) WNL WNL      Knee    Flexion (135) WNL WNL  Extension (0) WNL WNL      Ankle    Dorsiflexion (20) 10 (WB) 10 (WB)  Plantarflexion (50) 40 35  Inversion (35) 30 20  Eversion (15) 7 8  (* = pain; Blank rows = not tested)  LE MMT: MMT (out of 5) Right  Left   Hip flexion 4 4  Hip extension 4 4  Hip abduction 4 4+  Hip adduction 4+ 4  Hip internal rotation 5 5  Hip external rotation 5 5  Knee flexion 4 4  Knee extension 5 5  Ankle dorsiflexion 5 5  Ankle plantarflexion    Ankle inversion 5 5*  Ankle eversion 5 5*  (* = pain; Blank rows = not tested)  Sensation Diminished sensation left S1 and lateral  aspect of left foot.  Reflexes Deferred  Palpation Location LEFT  RIGHT           Plantar fascia 1 0  Dorsal aspect of foot 1 0  Tarsal tunnel 1 0  (Blank rows = not tested) Graded on 0-4 scale (0 = no pain, 1 = pain, 2 = pain with wincing/grimacing/flinching, 3 = pain with withdrawal, 4 = unwilling to allow  palpation), (Blank rows = not tested)  Passive Accessory Motion Superior Tibiofibular Joint: WNL Inferior Tibiofibular Joint: WNL Talocrural Joint Distraction: WNL but noted pain with pulling feeling with left foot Talocrural Joint AP: L hypomobile, painful where hand is purchased Talocrural Joint PA: L hypomobile  SPECIAL TESTS Ligamentous Integrity Anterior Drawer (ATF, 10-15 plantarflexion with anterior translation): Negative Talar Tilt (CFL, inversion): Negative  Updated outcome measures (03/04/24): HAGOS Score: 37.3% (Symptoms Score: 46.4%; Pain Score: 42.5%; Physical Function, Daily Living Score: 40%; Function, Sports, and Recreational Activities Score: 37.5%; Participation in Physical Activities Score: 37.5%; Quality of Life Score: 20 %)  TODAY'S TREATMENT: 03/25/2024    Subjective: Reports 7/10 R knee pain upon arrival today. Denies L knee pain or bilateral hip pain. Overall she has been doing pretty well. No specific questions upon arrival.   PAIN: 7/10 R knee pain   Ther-ex  NuStep L2-5 x 10 minutes for BLE strengthening and L knee ROM during interval history; Hooklying clams with manual resistance from therapist 2 x 15 BLE; Hooklying adductor squeeze with manual resistance from therapist 2 x 15 BLE; Hooklying bridges 2 x 15; Hooklying bridge marches x 10 BLE;  Supine SLR with 3# AW and light manual resistance from therapist 2 x 15 BLE; Supine SAQ over bolster with manual resistance 2 x 15  BLE; Sidelying reverse clams with 3# AW 2 x 15 BLE; Sidelying clams with manual resistance 2 x 15 BLE; Sidelying hip abduction with 3# AW 2 x 15 BLE;   Not today:  Prone L knee hamstring curls with manual resistance from therapist 2 x 10; Prone LLE straight knee hip extension 2 x 10; Prone L hip ER with knee at 90 flexion and manual resistance from therapist 2 x 10; Pball bridges with arms in front of chest 2 x 10; Standing hip strengthening with 4# ankle weights: Supine  straight leg hip abduction/adduction with manual resistance 2 x 10 BLE; Heel slides with resisted extension 2 x 10 BLE; Total Gym L20 double leg partial squats 2 x 15; TG L20 double leg heel raises 2 x 15; Hip flexion marches x 15 BLE; Standing HS curls, 4# AW x 15 BLE; TRX Reverse lunges 1 x 8 BLE;  TRX Squats to gray chair height, 2 x 8;  Resisted side stepping with blue tband around ankles 10' x multiple bouts;   PATIENT EDUCATION:  Education details: Pt educated throughout session about proper posture and technique with exercises. Improved exercise technique, movement at target joints, use of target muscles after min to mod verbal, visual, tactile cues.  Person educated: Patient Education method: Explanation, Actor cues, Verbal cues, and Handouts Education comprehension: verbalized understanding and returned demonstration   HOME EXERCISE PROGRAM:  Access Code: [email or web address] URL: [email or web address] Date: 12/11/2023 Prepared by: Selinda Eck  Exercises - Seated Ankle Eversion with Resistance  - 1 x daily - 7 x weekly - 2 sets - 10 reps - 3s hold - Seated Ankle Inversion with Resistance and Legs Crossed (Mirrored)  - 1 x daily - 7 x weekly - 2 sets - 10  reps - 3s hold - Seated Ankle Plantarflexion with Resistance  - 1 x daily - 7 x weekly - 2 sets - 10 reps - 3s hold - Long Sitting Calf Stretch with Strap  - 1 x daily - 7 x weekly - 3 reps - 30-45s hold - Standing Gastroc Stretch on Step  - 1 x daily - 7 x weekly - 3 sets - 10 reps - Standing Dorsiflexion Self-Mobilization on Step  - 1 x daily - 7 x weekly - 3 sets - 10 reps - Standing Dorsiflexion Self-Mobilization on Step (Mirrored)  - 1 x daily - 7 x weekly - 3 sets - 10 reps - Hooklying Isometric Hip Abduction with Belt  - 1 x daily - 7 x weekly - 2 sets - 5 reps - 30s hold/30s relax hold   ASSESSMENT:  CLINICAL IMPRESSION: Pt reports increase in R knee pain upon arrival so session focused on mat table  strengthening to minimize long axis force through knee joint. She denies any increase in pain during session today. No HEP updates at this time. Pt reports reduction in left knee pain at end of tx session. Pt will continue to benefit from PT services to address deficits in strength and pain in order to improve pain-free function at home and with leisure activities.   OBJECTIVE IMPAIRMENTS: decreased strength and hypomobility.   ACTIVITY LIMITATIONS: stairs and locomotion level  PARTICIPATION LIMITATIONS: occupation and yard work  PERSONAL FACTORS: Age, Behavior pattern, Past/current experiences, Time since onset of injury/illness/exacerbation, and 1-2 comorbidities: type II diabetes and left lumbar radiculopathy are also affecting patient's functional outcome.   REHAB POTENTIAL: Good  CLINICAL DECISION MAKING: Evolving/moderate complexity  EVALUATION COMPLEXITY: Moderate   GOALS: Goals reviewed with patient? Yes  SHORT TERM GOALS:   Pt will be independent with HEP to improve strength and decrease ankle pain to improve pain-free function at home and work. Baseline:  Goal status: ACHIEVED   LONG TERM GOALS: Target date: 03/04/2024   1.  Pt will decrease worst ankle pain by at least 3 points on the NPRS in order to demonstrate clinically significant reduction in ankle pain. Baseline: 06/08/23: 1/10 best, 5/10 worst; 08/14/23: worst: 1/10; 03/04/24: 2/10; Goal status: ACHIEVED  2.  Pt will increase FADI score by at least 4 points in order demonstrate clinically significant reduction in ankle pain/disability.       Baseline: 06/13/23: 65%; 08/14/23: 80 / 104 or 77% Goal status: ACHIEVED  3.  Pt will improve strength of ankle inversion/eversion with limited to no pain in order to demonstrate improvement in strength and function.  Baseline: 06/08/23: 5/5 with pain; 08/14/23: 5/5 without pain Goal status: ACHIEVED  4.  Pt will report at least 50% improvement in L ankle and knee symptoms in  order to demonstrate improvement in her ability to work in the yard/garden with less pain.  Baseline: 08/14/23: 75% improvement Goal status: ACHIEVED  5.  Pt will decrease HAGOS score by at least 20 points in order demonstrate clinically significant reduction in hip pain/disability.  Baseline: 08/14/23: HAGOS Score: 37.7%, 12/11/23: 44.3%; 03/04/24: 37.3% Goal status: ONGOING  6.  Pt will decrease worst posterior R hip pain by at least 3 points on the NPRS in order to demonstrate clinically significant reduction in hip pain. Baseline: 08/14/23: worst: 8/10; 12/11/23: 2/10; 03/04/24: 5/10; Goal status: ACHIEVED   PLAN: PT FREQUENCY: 1x/week  PT DURATION: 12 weeks  PLANNED INTERVENTIONS: Therapeutic exercises, Therapeutic activity, Neuromuscular re-education, Balance training, Gait training,  Patient/Family education, Self Care, Joint mobilization, Joint manipulation, Vestibular training, Canalith repositioning, Orthotic/Fit training, DME instructions, Dry Needling, Electrical stimulation, Spinal manipulation, Spinal mobilization, Cryotherapy, Moist heat, Taping, Traction, Ultrasound, Ionotophoresis 4mg /ml Dexamethasone , Manual therapy, and Re-evaluation.  PLAN FOR NEXT SESSION: strengthening and mobility exercises focused on R hip pain, review and modify HEP as necessary   Hazael Olveda D Vaughan Garfinkle PT, DPT, GCS  10:23 AM,03/25/24

## 2024-03-27 NOTE — Progress Notes (Signed)
 Remote Loop Recorder Transmission

## 2024-03-29 ENCOUNTER — Ambulatory Visit: Payer: Self-pay | Admitting: Cardiology

## 2024-04-01 ENCOUNTER — Ambulatory Visit: Admitting: Physical Therapy

## 2024-04-01 DIAGNOSIS — M25551 Pain in right hip: Secondary | ICD-10-CM

## 2024-04-01 DIAGNOSIS — G8929 Other chronic pain: Secondary | ICD-10-CM

## 2024-04-01 DIAGNOSIS — R2681 Unsteadiness on feet: Secondary | ICD-10-CM

## 2024-04-01 DIAGNOSIS — M25562 Pain in left knee: Secondary | ICD-10-CM | POA: Diagnosis not present

## 2024-04-01 DIAGNOSIS — M6281 Muscle weakness (generalized): Secondary | ICD-10-CM

## 2024-04-01 DIAGNOSIS — R262 Difficulty in walking, not elsewhere classified: Secondary | ICD-10-CM

## 2024-04-01 NOTE — Therapy (Signed)
 OUTPATIENT PHYSICAL THERAPY ANKLE/KNEE/HIP TREATMENT  Patient Name: Brandy [name] MRN: [id number] DOB:1955-11-24, 68 y.o., female Today's Date: 04/01/2024  END OF SESSION:  PT End of Session - 04/01/24 0905     Visit Number 33    Number of Visits 73    Date for Recertification  05/27/24    Authorization Type eval: 06/08/23    PT Start Time 0905    PT Stop Time 0948    PT Time Calculation (min) 43 min    Activity Tolerance Patient tolerated treatment well    Behavior During Therapy [facility] for tasks assessed/performed         Past Medical History:  Diagnosis Date   Acute lower GI bleeding 01/19/2020   Arthritis    Atrial fibrillation with rapid ventricular response (HCC)    Chicken pox    Chronic systolic CHF (congestive heart failure) (HCC)    a. 03/2016 Echo: Ef 15-20%, diff HK, ant AK;  b. 05/2016 Echo: Ef 30-35%, diff HK, mildly dil LA/RA.   Diabetes mellitus without complication (HCC)    Dysrhythmia    GERD (gastroesophageal reflux disease)    Heart murmur    Hip discomfort    been going on for 40 years    History of kidney stones    Hyperlipidemia associated with type 2 diabetes mellitus (HCC) 05/12/2021   Hypertension    Moderate mitral regurgitation    a. 03/2016 Echo: mod MR in setting of LV dysfxn.   Motion sickness    car - back seat   NICM (nonischemic cardiomyopathy) (HCC)    a. 03/2016 Echo: EF 15-20%, diff HK, ant AK, mod MR, mod dil LA, mildly dil RA;  b. 04/2016 Cath: nl cors;  c. 05/2016 Echo: EF 30-35%, diff HK.   Obesity    Obstructive sleep apnea    compliant with CPAP   Persistent atrial fibrillation (HCC)    a. Dx 03/2016;  b. CHA2DS2VASc = 2-->Eliquis  5mg  BID;  b. 05/2016 Failed DCCV x 4.   Sleep apnea    Stomach irritation    Visit for monitoring Tikosyn  therapy 07/24/2016   Past Surgical History:  Procedure Laterality Date   ATRIAL FIBRILLATION ABLATION N/A 12/09/2020   Procedure: ATRIAL FIBRILLATION ABLATION;  Surgeon: Cindie Ole ONEIDA, MD;   Location: [facility];  Service: Cardiovascular;  Laterality: N/A;   BIOPSY N/A 01/12/2020   Procedure: BIOPSY;  Surgeon: Janalyn Keene NOVAK, MD;  Location: [facility];  Service: Endoscopy;  Laterality: N/A;   CARDIAC CATHETERIZATION N/A 04/17/2016   Procedure: Left Heart Cath and Coronary Angiography;  Surgeon: Deatrice DELENA Cage, MD;  Location: [facility];  Service: Cardiovascular;  Laterality: N/A;   CARDIOVERSION N/A 10/22/2020   Procedure: CARDIOVERSION;  Surgeon: Mady Bruckner, MD;  Location: [facility] OR[facility];  Service: Cardiovascular;  Laterality: N/A;   CARDIOVERSION N/A 01/18/2024   Procedure: CARDIOVERSION;  Surgeon: Shlomo Wilbert SAUNDERS, MD;  Location: [facility];  Service: Cardiovascular;  Laterality: N/A;   CHOLECYSTECTOMY     COLON SURGERY  2021   COLONOSCOPY N/A 01/20/2020   Procedure: COLONOSCOPY;  Surgeon: Maryruth Ole ONEIDA, MD;  Location: [facility];  Service: Endoscopy;  Laterality: N/A;   COLONOSCOPY WITH PROPOFOL  N/A 01/12/2020   Procedure: COLONOSCOPY WITH PROPOFOL ;  Surgeon: Janalyn Keene NOVAK, MD;  Location: [facility];  Service: Endoscopy;  Laterality: N/A;  Diabetic - oral meds sleep apnea   ELECTROPHYSIOLOGIC STUDY N/A 05/26/2016   Procedure: Cardioversion;  Surgeon: Deatrice  DELENA Cage, MD;  Location: [facility];  Service: Cardiovascular;  Laterality: N/A;   ESOPHAGOGASTRODUODENOSCOPY (EGD) WITH PROPOFOL  N/A 01/12/2020   Procedure: ESOPHAGOGASTRODUODENOSCOPY (EGD) WITH PROPOFOL ;  Surgeon: Janalyn Keene NOVAK, MD;  Location: [facility];  Service: Endoscopy;  Laterality: N/A;   POLYPECTOMY N/A 01/12/2020   Procedure: POLYPECTOMY;  Surgeon: Janalyn Keene NOVAK, MD;  Location: [facility];  Service: Endoscopy;  Laterality: N/A;   Patient Active Problem List   Diagnosis Date Noted   Mucous cyst of digit of left hand 05/18/2023   Peroneal tendinitis, left 10/13/2022   Left lumbar radiculopathy 07/26/2022   Arthralgia of  left knee 07/26/2022   Pes anserinus bursitis of right knee 04/19/2022   Osteoarthritis of both knees 03/27/2022   Greater trochanteric pain syndrome of both lower extremities 03/27/2022   It band syndrome, right 03/27/2022   Hyperlipidemia associated with type 2 diabetes mellitus (HCC) 05/12/2021   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   Varicose veins of leg with pain, bilateral 07/06/2020   Type II diabetes mellitus with complication (HCC) 01/19/2020   Gastric polyp    Colon polyps    Varicose veins of both lower extremities 10/20/2019   Dysphagia 10/20/2019   Osteoarthritis of right hand 10/31/2016   Nonischemic cardiomyopathy (HCC) 04/14/2016   BMI 33.0-33.9,adult 04/14/2016   Persistent atrial fibrillation (HCC) 04/12/2016   PCP: Leita Adie, MD  REFERRING PROVIDER: Selinda Ku, MD  REFERRING DIAG:  [phone] (ICD-10-CM) - Peroneal tendinitis, left  M54.16 (ICD-10-CM) - Left lumbar radiculopathy  M25.562 (ICD-10-CM) - Arthralgia of left knee  M17.11 (ICD-10-CM) - Primary osteoarthritis of right knee  M76.31 (ICD-10-CM) - It band syndrome, right  M25.551 (ICD-10-CM) - Greater 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   Rationale for Evaluation and Treatment: Rehabilitation  THERAPY DIAG: Muscle weakness (generalized)  Pain in right hip  Chronic pain of left knee  Difficulty in walking, not elsewhere classified  Unsteadiness on feet  ONSET DATE: Chronic, aggravated over Christmas (December 2024)  FOLLOW-UP APPT SCHEDULED WITH REFERRING PROVIDER: No, initially no but now has appt for July;  INITIAL EVALUATION: SUBJECTIVE:                                                                                                                                                                                         SUBJECTIVE STATEMENT:  Left ankle and foot pain  PERTINENT HISTORY: Pt reports pain in left ankle that seems to have flared up over  Christmas after pt reported excess walking. Pt reports feeling pulling and burning at the base of the lateral ankle. She has previously been to PT for pain in  her left hip and knee but has now noticed increased pain in the left ankle/foot. Pt expresses difficulty with exercises; specifically pressure on the toes, stretching calves, and rocking on toes. Pt notes pain is as high as 5/10 NPS but is constant and never really goes away, with lowest pain at 1/10 NPS.  PAIN:    Pain Intensity: Present: 1/10, Best: 1/10, Worst: 5/10 Pain location: Lateral foot and ankle Pain Quality: constant and burning  Radiating: Yes  Swelling: No  Popping, catching, locking: No  Numbness/Tingling: No Focal Weakness: Yes Aggravating factors: Touching lateral ankle, moving the ankle, ascending steps when leading with left foot, walking Relieving factors: Stopping activity, resting, brace temporarily relieves some pain 24-hour pain behavior: No night pain when sleeping on back, stretching in AM helps but feels unstable, PM pain depends on activity during the day History of prior back, hip, knee, or ankle injury, pain, surgery, or therapy: Yes; chronic hip, knee, foot pain Dominant hand: right Imaging: No  Typical footwear: Sneakers or rubber boots Red flags: Negative for personal history of cancer, chills/fever, night sweats, nausea, vomiting, unrelenting pain): Negative  PRECAUTIONS: None  WEIGHT BEARING RESTRICTIONS: No  FALLS: Has patient fallen in last 6 months? 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  OBJECTIVE:   Patient Surveys  08/14/23: FADI: FADI Activities Score: 80 / 104 or 77%  Cognition Patient is oriented to person, place, and time.  Recent memory is intact.  Remote memory is intact.  Attention span and concentration are intact.  Expressive speech is intact.  Patient's fund of knowledge is within normal limits for educational level.    Gross Musculoskeletal Assessment Bulk: Normal Tone: Normal No trophic changes noted to foot/ankle. No ecchymosis, erythema, or edema noted. No gross ankle/foot deformity noted  GAIT: Comments: Mild toe out gait on right foot. Antalgic gait noted on left side.  Posture: No noted discrepancies.  AROM AROM (Normal range in degrees) AROM       Hip Right Left  Flexion (125) WNL WNL  Extension (15) WNL WNL  Abduction (40) WNL WNL  Adduction  WNL WNL  Internal Rotation (45) WNL WNL  External Rotation (45) WNL WNL      Knee    Flexion (135) WNL WNL  Extension (0) WNL WNL      Ankle    Dorsiflexion (20) 10 (WB) 10 (WB)  Plantarflexion (50) 40 35  Inversion (35) 30 20  Eversion (15) 7 8  (* = pain; Blank rows = not tested)  LE MMT: MMT (out of 5) Right  Left   Hip flexion 4 4  Hip extension 4 4  Hip abduction 4 4+  Hip adduction 4+ 4  Hip internal rotation 5 5  Hip external rotation 5 5  Knee flexion 4 4  Knee extension 5 5  Ankle dorsiflexion 5 5  Ankle plantarflexion    Ankle inversion 5 5*  Ankle eversion 5 5*  (* = pain; Blank rows =  not tested)  Sensation Diminished sensation left S1 and lateral aspect of left foot.  Reflexes Deferred  Palpation Location LEFT  RIGHT           Plantar fascia 1 0  Dorsal aspect of foot 1 0  Tarsal tunnel 1 0  (Blank rows = not tested) Graded on 0-4 scale (0 = no pain, 1 = pain, 2 = pain with  wincing/grimacing/flinching, 3 = pain with withdrawal, 4 = unwilling to allow palpation), (Blank rows = not tested)  Passive Accessory Motion Superior Tibiofibular Joint: WNL Inferior Tibiofibular Joint: WNL Talocrural Joint Distraction: WNL but noted pain with pulling feeling with left foot Talocrural Joint AP: L hypomobile, painful where hand is purchased Talocrural Joint PA: L hypomobile  SPECIAL TESTS Ligamentous Integrity Anterior Drawer (ATF, 10-15 plantarflexion with anterior translation): Negative Talar Tilt (CFL, inversion): Negative  Updated outcome measures (03/04/24): HAGOS Score: 37.3% (Symptoms Score: 46.4%; Pain Score: 42.5%; Physical Function, Daily Living Score: 40%; Function, Sports, and Recreational Activities Score: 37.5%; Participation in Physical Activities Score: 37.5%; Quality of Life Score: 20 %)  TODAY'S TREATMENT: 04/01/2024    Subjective:  Pt. reports the occasional 1/10 L knee pain upon arrival to clinic.  Pt. Reports no R hip or R knee discomfort this morning.  Overall she has been doing pretty well.  Pt. Reports the recent use of Flonase was causing increase aches and pain.  Pt. Stopped Flonase yesterday and feels better this morning.    Ther-ex:  NuStep L1-5 x 10 minutes for BLE strengthening; Hooklying adductor squeeze with ball 2 x 15 BLE; Hooklying bridges (ball between knees) 2 x 15; Hooklying bridge marches x 10 BLE;  Supine SAQ over bolster with 3# AW 2 x 15  BLE;  There.act.: TRX Squats to gray chair height, 2 x 8;  TRX Lateral squats 8x L/R (good pull in hips); TRX Reverse lunges 1 x 8 BLE;  Resisted gait at Nautilus: 4x all 4-planes of movement (SBA for safety/ cuing). Walking around PT clinic with gait reassessment (no increase c/o pain)   Not today:  Prone L knee hamstring curls with manual resistance from therapist 2 x 10; Prone LLE straight knee hip extension 2 x 10; Prone L hip ER with knee at 90 flexion and manual resistance  from therapist 2 x 10; Pball bridges with arms in front of chest 2 x 10; Standing hip strengthening with 4# ankle weights: Supine straight leg hip abduction/adduction with manual resistance 2 x 10 BLE; Heel slides with resisted extension 2 x 10 BLE; Total Gym L20 double leg partial squats 2 x 15; TG L20 double leg heel raises 2 x 15; Hip flexion marches x 15 BLE; Standing HS curls, 4# AW x 15 BLE; Resisted side stepping with blue tband around ankles 10' x multiple bouts;   PATIENT EDUCATION:  Education details: Pt educated throughout session about proper posture and technique with exercises. Improved exercise technique, movement at target joints, use of target muscles after min to mod verbal, visual, tactile cues.  Person educated: Patient Education method: Explanation, Actor cues, Verbal cues, and Handouts Education comprehension: verbalized understanding and returned demonstration   HOME EXERCISE PROGRAM:  Access Code: [email or web address] URL: [email or web address] Date: 12/11/2023 Prepared by: Selinda Eck  Exercises - Seated Ankle Eversion with Resistance  - 1 x daily - 7 x weekly - 2 sets - 10 reps - 3s hold - Seated Ankle Inversion with Resistance and Legs Crossed (Mirrored)  - 1 x daily - 7 x  weekly - 2 sets - 10 reps - 3s hold - Seated Ankle Plantarflexion with Resistance  - 1 x daily - 7 x weekly - 2 sets - 10 reps - 3s hold - Long Sitting Calf Stretch with Strap  - 1 x daily - 7 x weekly - 3 reps - 30-45s hold - Standing Gastroc Stretch on Step  - 1 x daily - 7 x weekly - 3 sets - 10 reps - Standing Dorsiflexion Self-Mobilization on Step  - 1 x daily - 7 x weekly - 3 sets - 10 reps - Standing Dorsiflexion Self-Mobilization on Step (Mirrored)  - 1 x daily - 7 x weekly - 3 sets - 10 reps - Hooklying Isometric Hip Abduction with Belt  - 1 x daily - 7 x weekly - 2 sets - 5 reps - 30s hold/30s relax hold   ASSESSMENT:  CLINICAL IMPRESSION: Pt. denies any increase in  pain during session today.  Pt. works hard during tx. Session with good balance/ LE stability with resisted gait/ lateral TRX lunges.  No HEP updates at this time.  Pt will continue to benefit from PT services to address deficits in strength and pain in order to improve pain-free function at home and with leisure activities.   OBJECTIVE IMPAIRMENTS: decreased strength and hypomobility.   ACTIVITY LIMITATIONS: stairs and locomotion level  PARTICIPATION LIMITATIONS: occupation and yard work  PERSONAL FACTORS: Age, Behavior pattern, Past/current experiences, Time since onset of injury/illness/exacerbation, and 1-2 comorbidities: type II diabetes and left lumbar radiculopathy are also affecting patient's functional outcome.   REHAB POTENTIAL: Good  CLINICAL DECISION MAKING: Evolving/moderate complexity  EVALUATION COMPLEXITY: Moderate   GOALS: Goals reviewed with patient? Yes  SHORT TERM GOALS:   Pt will be independent with HEP to improve strength and decrease ankle pain to improve pain-free function at home and work. Baseline:  Goal status: ACHIEVED   LONG TERM GOALS: Target date: 03/04/2024   1.  Pt will decrease worst ankle pain by at least 3 points on the NPRS in order to demonstrate clinically significant reduction in ankle pain. Baseline: 06/08/23: 1/10 best, 5/10 worst; 08/14/23: worst: 1/10; 03/04/24: 2/10; Goal status: ACHIEVED  2.  Pt will increase FADI score by at least 4 points in order demonstrate clinically significant reduction in ankle pain/disability.       Baseline: 06/13/23: 65%; 08/14/23: 80 / 104 or 77% Goal status: ACHIEVED  3.  Pt will improve strength of ankle inversion/eversion with limited to no pain in order to demonstrate improvement in strength and function.  Baseline: 06/08/23: 5/5 with pain; 08/14/23: 5/5 without pain Goal status: ACHIEVED  4.  Pt will report at least 50% improvement in L ankle and knee symptoms in order to demonstrate improvement in her  ability to work in the yard/garden with less pain.  Baseline: 08/14/23: 75% improvement Goal status: ACHIEVED  5.  Pt will decrease HAGOS score by at least 20 points in order demonstrate clinically significant reduction in hip pain/disability.  Baseline: 08/14/23: HAGOS Score: 37.7%, 12/11/23: 44.3%; 03/04/24: 37.3% Goal status: ONGOING  6.  Pt will decrease worst posterior R hip pain by at least 3 points on the NPRS in order to demonstrate clinically significant reduction in hip pain. Baseline: 08/14/23: worst: 8/10; 12/11/23: 2/10; 03/04/24: 5/10; Goal status: ACHIEVED   PLAN: PT FREQUENCY: 1x/week  PT DURATION: 12 weeks  PLANNED INTERVENTIONS: Therapeutic exercises, Therapeutic activity, Neuromuscular re-education, Balance training, Gait training, Patient/Family education, Self Care, Joint mobilization, Joint manipulation, Vestibular training,  Canalith repositioning, Orthotic/Fit training, DME instructions, Dry Needling, Electrical stimulation, Spinal manipulation, Spinal mobilization, Cryotherapy, Moist heat, Taping, Traction, Ultrasound, Ionotophoresis 4mg /ml Dexamethasone , Manual therapy, and Re-evaluation.  PLAN FOR NEXT SESSION: strengthening and mobility exercises focused on R hip pain, review and modify HEP as necessary   Ozell JAYSON Sero, PT, DPT # [id number] 11:06 AM,04/01/24

## 2024-04-06 ENCOUNTER — Other Ambulatory Visit: Payer: Self-pay

## 2024-04-07 NOTE — Therapy (Signed)
 OUTPATIENT PHYSICAL THERAPY ANKLE/KNEE/HIP TREATMENT/DISCHARGE  Patient Name: Brandy [name] MRN: [id number] DOB:12-Jul-1955, 68 y.o., female Today's Date: 04/08/2024  END OF SESSION:  PT End of Session - 04/08/24 0928     Visit Number 34    Number of Visits 73    Date for Recertification  05/27/24    Authorization Type eval: 06/08/23    PT Start Time 0850    PT Stop Time 0930    PT Time Calculation (min) 40 min    Activity Tolerance Patient tolerated treatment well    Behavior During Therapy [facility] for tasks assessed/performed         Past Medical History:  Diagnosis Date   Acute lower GI bleeding 01/19/2020   Arthritis    Atrial fibrillation with rapid ventricular response (HCC)    Chicken pox    Chronic systolic CHF (congestive heart failure) (HCC)    a. 03/2016 Echo: Ef 15-20%, diff HK, ant AK;  b. 05/2016 Echo: Ef 30-35%, diff HK, mildly dil LA/RA.   Diabetes mellitus without complication (HCC)    Dysrhythmia    GERD (gastroesophageal reflux disease)    Heart murmur    Hip discomfort    been going on for 40 years    History of kidney stones    Hyperlipidemia associated with type 2 diabetes mellitus (HCC) 05/12/2021   Hypertension    Moderate mitral regurgitation    a. 03/2016 Echo: mod MR in setting of LV dysfxn.   Motion sickness    car - back seat   NICM (nonischemic cardiomyopathy) (HCC)    a. 03/2016 Echo: EF 15-20%, diff HK, ant AK, mod MR, mod dil LA, mildly dil RA;  b. 04/2016 Cath: nl cors;  c. 05/2016 Echo: EF 30-35%, diff HK.   Obesity    Obstructive sleep apnea    compliant with CPAP   Persistent atrial fibrillation (HCC)    a. Dx 03/2016;  b. CHA2DS2VASc = 2-->Eliquis  5mg  BID;  b. 05/2016 Failed DCCV x 4.   Sleep apnea    Stomach irritation    Visit for monitoring Tikosyn  therapy 07/24/2016   Past Surgical History:  Procedure Laterality Date   ATRIAL FIBRILLATION ABLATION N/A 12/09/2020   Procedure: ATRIAL FIBRILLATION ABLATION;  Surgeon: Cindie Ole ONEIDA, MD;  Location: [facility];  Service: Cardiovascular;  Laterality: N/A;   BIOPSY N/A 01/12/2020   Procedure: BIOPSY;  Surgeon: Janalyn Keene NOVAK, MD;  Location: [facility];  Service: Endoscopy;  Laterality: N/A;   CARDIAC CATHETERIZATION N/A 04/17/2016   Procedure: Left Heart Cath and Coronary Angiography;  Surgeon: Deatrice DELENA Cage, MD;  Location: [facility] INVASIVE CV LAB;  Service: Cardiovascular;  Laterality: N/A;   CARDIOVERSION N/A 10/22/2020   Procedure: CARDIOVERSION;  Surgeon: Mady Bruckner, MD;  Location: [facility] ORS;  Service: Cardiovascular;  Laterality: N/A;   CARDIOVERSION N/A 01/18/2024   Procedure: CARDIOVERSION;  Surgeon: Shlomo Wilbert SAUNDERS, MD;  Location: [facility];  Service: Cardiovascular;  Laterality: N/A;   CHOLECYSTECTOMY     COLON SURGERY  2021   COLONOSCOPY N/A 01/20/2020   Procedure: COLONOSCOPY;  Surgeon: Maryruth Ole ONEIDA, MD;  Location: [facility] ENDOSCOPY;  Service: Endoscopy;  Laterality: N/A;   COLONOSCOPY WITH PROPOFOL  N/A 01/12/2020   Procedure: COLONOSCOPY WITH PROPOFOL ;  Surgeon: Janalyn Keene NOVAK, MD;  Location: [facility];  Service: Endoscopy;  Laterality: N/A;  Diabetic - oral meds sleep apnea   ELECTROPHYSIOLOGIC STUDY N/A 05/26/2016   Procedure: Cardioversion;  Surgeon: Deatrice  DELENA Cage, MD;  Location: [facility] ORS;  Service: Cardiovascular;  Laterality: N/A;   ESOPHAGOGASTRODUODENOSCOPY (EGD) WITH PROPOFOL  N/A 01/12/2020   Procedure: ESOPHAGOGASTRODUODENOSCOPY (EGD) WITH PROPOFOL ;  Surgeon: Janalyn Keene NOVAK, MD;  Location: [facility];  Service: Endoscopy;  Laterality: N/A;   POLYPECTOMY N/A 01/12/2020   Procedure: POLYPECTOMY;  Surgeon: Janalyn Keene NOVAK, MD;  Location: [facility];  Service: Endoscopy;  Laterality: N/A;   Patient Active Problem List   Diagnosis Date Noted   Mucous cyst of digit of left hand 05/18/2023   Peroneal tendinitis, left 10/13/2022   Left lumbar radiculopathy 07/26/2022    Arthralgia of left knee 07/26/2022   Pes anserinus bursitis of right knee 04/19/2022   Osteoarthritis of both knees 03/27/2022   Greater trochanteric pain syndrome of both lower extremities 03/27/2022   It band syndrome, right 03/27/2022   Hyperlipidemia associated with type 2 diabetes mellitus (HCC) 05/12/2021   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   Varicose veins of leg with pain, bilateral 07/06/2020   Type II diabetes mellitus with complication (HCC) 01/19/2020   Gastric polyp    Colon polyps    Varicose veins of both lower extremities 10/20/2019   Dysphagia 10/20/2019   Osteoarthritis of right hand 10/31/2016   Nonischemic cardiomyopathy (HCC) 04/14/2016   BMI 33.0-33.9,adult 04/14/2016   Persistent atrial fibrillation (HCC) 04/12/2016   PCP: Leita Adie, MD  REFERRING PROVIDER: Selinda Ku, MD  REFERRING DIAG:  [phone] (ICD-10-CM) - Peroneal tendinitis, left  M54.16 (ICD-10-CM) - Left lumbar radiculopathy  M25.562 (ICD-10-CM) - Arthralgia of left knee  M17.11 (ICD-10-CM) - Primary osteoarthritis of right knee  M76.31 (ICD-10-CM) - It band syndrome, right  M25.551 (ICD-10-CM) - Greater 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   Rationale for Evaluation and Treatment: Rehabilitation  THERAPY DIAG: Muscle weakness (generalized)  Pain in right hip  Chronic pain of left knee  Difficulty in walking, not elsewhere classified  ONSET DATE: Chronic, aggravated over Christmas (December 2024)  FOLLOW-UP APPT SCHEDULED WITH REFERRING PROVIDER: No, initially no but now has appt for July;  INITIAL EVALUATION: SUBJECTIVE:                                                                                                                                                                                         SUBJECTIVE STATEMENT:  Left ankle and foot pain  PERTINENT HISTORY: Pt reports pain in left ankle that seems to have flared up over Christmas  after pt reported excess walking. Pt reports feeling pulling and burning at the base of the lateral ankle. She has previously been to PT for pain in her left hip and  knee but has now noticed increased pain in the left ankle/foot. Pt expresses difficulty with exercises; specifically pressure on the toes, stretching calves, and rocking on toes. Pt notes pain is as high as 5/10 NPS but is constant and never really goes away, with lowest pain at 1/10 NPS.  PAIN:    Pain Intensity: Present: 1/10, Best: 1/10, Worst: 5/10 Pain location: Lateral foot and ankle Pain Quality: constant and burning  Radiating: Yes  Swelling: No  Popping, catching, locking: No  Numbness/Tingling: No Focal Weakness: Yes Aggravating factors: Touching lateral ankle, moving the ankle, ascending steps when leading with left foot, walking Relieving factors: Stopping activity, resting, brace temporarily relieves some pain 24-hour pain behavior: No night pain when sleeping on back, stretching in AM helps but feels unstable, PM pain depends on activity during the day History of prior back, hip, knee, or ankle injury, pain, surgery, or therapy: Yes; chronic hip, knee, foot pain Dominant hand: right Imaging: No  Typical footwear: Sneakers or rubber boots Red flags: Negative for personal history of cancer, chills/fever, night sweats, nausea, vomiting, unrelenting pain): Negative  PRECAUTIONS: None  WEIGHT BEARING RESTRICTIONS: No  FALLS: Has patient fallen in last 6 months? 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  OBJECTIVE:   Patient Surveys  08/14/23: FADI: FADI Activities Score: 80 / 104 or 77%  Cognition Patient is oriented to person, place, and time.  Recent memory is intact.  Remote memory is intact.  Attention span and concentration are intact.  Expressive speech is intact.  Patient's fund of knowledge is within normal limits for educational level.    Gross Musculoskeletal Assessment Bulk: Normal Tone: Normal No trophic changes noted to foot/ankle. No ecchymosis, erythema, or edema noted. No gross ankle/foot deformity noted  GAIT: Comments: Mild toe out gait on right foot. Antalgic gait noted on left side.  Posture: No noted discrepancies.  AROM AROM (Normal range in degrees) AROM       Hip Right Left  Flexion (125) WNL WNL  Extension (15) WNL WNL  Abduction (40) WNL WNL  Adduction  WNL WNL  Internal Rotation (45) WNL WNL  External Rotation (45) WNL WNL      Knee    Flexion (135) WNL WNL  Extension (0) WNL WNL      Ankle    Dorsiflexion (20) 10 (WB) 10 (WB)  Plantarflexion (50) 40 35  Inversion (35) 30 20  Eversion (15) 7 8  (* = pain; Blank rows = not tested)  LE MMT: MMT (out of 5) Right  Left   Hip flexion 4 4  Hip extension 4 4  Hip abduction 4 4+  Hip adduction 4+ 4  Hip internal rotation 5 5  Hip external rotation 5 5  Knee flexion 4 4  Knee extension 5 5  Ankle dorsiflexion 5 5  Ankle plantarflexion    Ankle inversion 5 5*  Ankle eversion 5 5*  (* = pain; Blank rows = not tested)  Sensation  Diminished sensation left S1 and lateral aspect of left foot.  Reflexes Deferred  Palpation Location LEFT  RIGHT           Plantar fascia 1 0  Dorsal aspect of foot 1 0  Tarsal tunnel 1 0  (Blank rows = not tested) Graded on 0-4 scale (0 = no pain, 1 = pain, 2 = pain with  wincing/grimacing/flinching, 3 = pain with withdrawal, 4 = unwilling to allow palpation), (Blank rows = not tested)  Passive Accessory Motion Superior Tibiofibular Joint: WNL Inferior Tibiofibular Joint: WNL Talocrural Joint Distraction: WNL but noted pain with pulling feeling with left foot Talocrural Joint AP: L hypomobile, painful where hand is purchased Talocrural Joint PA: L hypomobile  SPECIAL TESTS Ligamentous Integrity Anterior Drawer (ATF, 10-15 plantarflexion with anterior translation): Negative Talar Tilt (CFL, inversion): Negative  Updated outcome measures (03/04/24): HAGOS Score: 37.3% (Symptoms Score: 46.4%; Pain Score: 42.5%; Physical Function, Daily Living Score: 40%; Function, Sports, and Recreational Activities Score: 37.5%; Participation in Physical Activities Score: 37.5%; Quality of Life Score: 20 %)   TODAY'S TREATMENT: 04/08/2024    SUBJECTIVE: Pt reports that her hips, knees, and ankles have been doing relatively well. Her ankles have been more painful lately and she complains of 2/10 bilateral ankle pain upon arrival today. Today is her last scheduled PT appointment and she is ready for discharge.   PAIN: Bilateral 2/10 ankle pain;   Therapeutic Activity  NuStep L1-5 x 10 minutes for BLE strengthening; Resisted gait at [place] 80#, 3x all 4-planes of movement (SBA for safety/ cuing). Total Gym L22 double leg squats 2 x 10; TG L22 double leg heel raises 2 x 20; Updated outcome measures/goals with patient (see goal section); HAGOS Score: 46.3% (Symptoms Score: 42.9%; Pain Score: 52.5%; Physical Function, Daily Living Score: 60%; Function, Sports, and Recreational Activities Score: 50%; Participation in Physical Activities Score: 37.5%; Quality of Life Score: 35 %)   PATIENT EDUCATION:  Education details: Pt educated throughout session about proper posture and technique with exercises. Improved exercise technique, movement at target joints, use of target  muscles after min to mod verbal, visual, tactile cues. Outcome measures, discharge Person educated: Patient Education method: Explanation, Tactile cues, and Verbal cues Education comprehension: verbalized understanding and returned demonstration   HOME EXERCISE PROGRAM:  Access Code: [email or web address] URL: [email or web address] Date: 12/11/2023 Prepared by: Selinda Eck  Exercises - Seated Ankle Eversion with Resistance  - 1 x daily - 7 x weekly - 2 sets - 10 reps - 3s hold - Seated Ankle Inversion with Resistance and Legs Crossed (Mirrored)  - 1 x daily - 7 x weekly - 2 sets - 10 reps - 3s hold - Seated Ankle Plantarflexion with Resistance  - 1 x daily - 7 x weekly - 2 sets - 10 reps - 3s hold - Long Sitting Calf Stretch with Strap  - 1 x daily - 7 x weekly - 3 reps - 30-45s hold - Standing Gastroc Stretch on Step  - 1 x daily - 7 x weekly - 3 sets - 10 reps - Standing Dorsiflexion Self-Mobilization on Step  - 1 x daily - 7 x weekly - 3 sets - 10 reps - Standing Dorsiflexion Self-Mobilization on Step (Mirrored)  - 1 x daily - 7 x weekly - 3 sets - 10 reps - Hooklying Isometric Hip Abduction with Belt  - 1 x daily - 7 x weekly - 2 sets - 5 reps - 30s hold/30s relax hold   ASSESSMENT:  CLINICAL IMPRESSION: Updated outcome measures with patient during visit today. Her worst R hip pain has improved considerably since her initial evaluation from 8/10 initially to 2/10 today. Her HAGOS score also improved from 38% initially to 46.3%. She reports ongoing intermittent and activity dependent L foot, L ankle, L knee, and L hip pain. She has met all the goals related to her ankle and knee. Progressed strengthening for hips today with focus on minimizing/eliminating pain. Pt is ready to be discharged today. She will follow-up with referring provider in January unless additional issues arise sooner.   OBJECTIVE IMPAIRMENTS: decreased strength and hypomobility.   ACTIVITY LIMITATIONS: stairs  and locomotion level  PARTICIPATION LIMITATIONS: occupation and yard work  PERSONAL FACTORS: Age, Behavior pattern, Past/current experiences, Time since onset of injury/illness/exacerbation, and 1-2 comorbidities: type II diabetes and left lumbar radiculopathy are also affecting patient's functional outcome.   REHAB POTENTIAL: Good  CLINICAL DECISION MAKING: Evolving/moderate complexity  EVALUATION COMPLEXITY: Moderate   GOALS: Goals reviewed with patient? Yes  SHORT TERM GOALS:   Pt will be independent with HEP to improve strength and decrease ankle pain to improve pain-free function at home and work. Baseline:  Goal status: ACHIEVED   LONG TERM GOALS: Target date: 03/04/2024   1.  Pt will decrease worst ankle pain by at least 3 points on the NPRS in order to demonstrate clinically significant reduction in ankle pain. Baseline: 06/08/23: 1/10 best, 5/10 worst; 08/14/23: worst: 1/10; 03/04/24: 2/10; Goal status: ACHIEVED  2.  Pt will increase FADI score by at least 4 points in order demonstrate clinically significant reduction in ankle pain/disability.       Baseline: 06/13/23: 65%; 08/14/23: 80 / 104 or 77% Goal status: ACHIEVED  3.  Pt will improve strength of ankle inversion/eversion with limited to no pain in order to demonstrate improvement in strength and function.  Baseline: 06/08/23: 5/5 with pain; 08/14/23: 5/5 without pain Goal status: ACHIEVED  4.  Pt will report at least 50% improvement in L ankle and knee symptoms in order to demonstrate improvement in her ability to work in the yard/garden with less pain.  Baseline: 08/14/23: 75% improvement Goal status: ACHIEVED  5.  Pt will increase HAGOS score by at least 20 points in order demonstrate clinically significant reduction in hip pain/disability.  Baseline: 08/14/23: HAGOS Score: 37.7%, 12/11/23: 44.3%; 03/04/24: 37.3%; 04/08/24: 46.3% Goal status: ONGOING  6.  Pt will decrease worst posterior R hip pain by at least 3  points on the NPRS in order to demonstrate clinically significant reduction in hip pain. Baseline: 08/14/23: worst: 8/10; 12/11/23: 2/10; 03/04/24: 5/10; 04/08/24: 2/10; Goal status: ACHIEVED   PLAN: PT FREQUENCY: 1x/week  PT DURATION: 12 weeks  PLANNED INTERVENTIONS: Therapeutic exercises, Therapeutic activity, Neuromuscular re-education, Balance training, Gait training, Patient/Family education, Self Care, Joint mobilization, Joint manipulation, Vestibular training, Canalith repositioning, Orthotic/Fit training, DME instructions, Dry Needling, Electrical stimulation, Spinal manipulation, Spinal mobilization, Cryotherapy, Moist heat, Taping, Traction, Ultrasound, Ionotophoresis 4mg /ml Dexamethasone , Manual therapy, and Re-evaluation.  PLAN FOR NEXT SESSION: Discharge  Selinda JONETTA Eck PT, DPT, GCS  2:35 PM,04/08/24

## 2024-04-08 ENCOUNTER — Ambulatory Visit

## 2024-04-08 DIAGNOSIS — M25551 Pain in right hip: Secondary | ICD-10-CM

## 2024-04-08 DIAGNOSIS — M6281 Muscle weakness (generalized): Secondary | ICD-10-CM | POA: Diagnosis not present

## 2024-04-08 DIAGNOSIS — R262 Difficulty in walking, not elsewhere classified: Secondary | ICD-10-CM

## 2024-04-08 DIAGNOSIS — G8929 Other chronic pain: Secondary | ICD-10-CM | POA: Diagnosis not present

## 2024-04-08 DIAGNOSIS — M25562 Pain in left knee: Secondary | ICD-10-CM | POA: Diagnosis not present

## 2024-04-08 DIAGNOSIS — R2681 Unsteadiness on feet: Secondary | ICD-10-CM | POA: Diagnosis not present

## 2024-04-17 ENCOUNTER — Encounter: Attending: Student | Admitting: Dietician

## 2024-04-17 ENCOUNTER — Encounter: Payer: Self-pay | Admitting: Dietician

## 2024-04-17 DIAGNOSIS — E118 Type 2 diabetes mellitus with unspecified complications: Secondary | ICD-10-CM | POA: Insufficient documentation

## 2024-04-17 NOTE — Patient Instructions (Addendum)
 Work towards consuming 64 oz of fluids daily, mainly plain water . Try to consume 2 of your water  bottles daily.  Choose the higher protein breakfast bars like [place], [place], and [employer] protein  Keep up the great work losing weight!! Continue to lose weight around 1-2 lbs per week.  Try having [place] Cherry capsules in the evening after dinner to try to sleep throughout the night!  Continue to look for fasting glucose numbers under 130 mg/dL!

## 2024-04-17 NOTE — Progress Notes (Signed)
 Diabetes Self-Management Education  Visit Type: Follow-up  Appt. Start Time: 1520 Appt. End Time: 1600  04/17/2024  [name], identified by name and date of birth, is a 68 y.o. female with a diagnosis of Diabetes:  .   ASSESSMENT Pt reports continuing Farxiga  @10  mg, Metformin  @500  mg BID Pt reports logging glucose, brought logs to visit RD reviewed. Pt reports difficulty falling asleep at times and states that they get more and more hungry if it takes longer to fall asleep, states they wake up almost every day around 3:00 am. Pt reports trying to have small snack before bed to avoid waking up around 3 am hungry.  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 Pt reports trying to drink water  when they feel this way, with occasional relief. Pt reports stopping PT until check up with PCP in [place], still doing PT exercises at home. Pt reports snacking less while watching TV in the evening, especially if they have a satisfying dinner. Pt reports going for more salad, cooking vegetables in air fryer, having vegetables with lunch sandwiches. Pt reports losing 7 lbs in the last 6 weeks, states they are very happy with this progress!   Diabetes Self-Management Education - 04/17/24 1624       Visit Information   Visit Type Follow-up      Psychosocial Assessment   Learning Readiness Change in progress      Pre-Education Assessment   Patient understands the diabetes disease and treatment process. Comprehends key points    Patient understands incorporating nutritional management into lifestyle. Comprehends key points    Patient undertands incorporating physical activity into lifestyle. Needs Review   PT   Patient understands using medications safely. Demonstrates understanding / competency    Patient understands monitoring blood glucose, interpreting and using results Needs Review    Patient understands prevention, detection, and treatment  of acute complications. Comprehends key points    Patient understands prevention, detection, and treatment of chronic complications. Compreheands key points    Patient understands how to develop strategies to address psychosocial issues. Needs Review    Patient understands how to develop strategies to promote health/change behavior. Needs Review      Complications   How often do you check your blood sugar? 1-2 times/day    Fasting Blood glucose range (mg/dL) 869-820;29-870   Rarely under 130   Postprandial Blood glucose range (mg/dL) 869-820;819-799      Dietary Intake   Breakfast Peanut breakfast bar    Lunch PB and honey on wheat bread, apple sauce, 3 bean salad    Dinner Fried trout and shrimp, baked potato w/ butter, sour cream, salt and pepper, cole slaw,    Beverage(s) Water       Activity / Exercise   Activity / Exercise Type ADL's;Light (walking / raking leaves)   Doing PT exercises at home     Patient Education   Disease Pathophysiology Explored patient's options for treatment of their diabetes    Healthy Eating Role of diet in the treatment of diabetes and the relationship between the three main macronutrients and blood glucose level;Meal options for control of blood glucose level and chronic complications.    Medications Reviewed patients medication for diabetes, action, purpose, timing of dose and side effects.    Monitoring Identified appropriate SMBG and/or A1C goals.    Chronic complications Relationship between chronic complications and blood glucose control    Diabetes Stress and Support Identified  and addressed patients feelings and concerns about diabetes;Worked with patient to identify barriers to care and solutions    Lifestyle and Health Coping Lifestyle issues that need to be addressed for better diabetes care   Sleep     Individualized Goals (developed by patient)   Nutrition General guidelines for healthy choices and portions discussed    Medications take my  medication as prescribed    Monitoring  Test my blood glucose as discussed    Problem Solving Sleep Pattern    Reducing Risk examine blood glucose patterns    Health Coping Ask for help with psychological, social, or emotional issues      Patient Self-Evaluation of Goals - Patient rates self as meeting previously set goals (% of time)   Nutrition 50 - 75 % (half of the time)    Physical Activity < 25% (hardly ever/never)    Medications >75% (most of the time)    Monitoring >75% (most of the time)    Problem Solving and behavior change strategies  50 - 75 % (half of the time)    Health Coping 25 - 50% (sometimes)   Seasonal Depression     Post-Education Assessment   Patient understands the diabetes disease and treatment process. Comprehends key points    Patient understands incorporating nutritional management into lifestyle. Comprehends key points    Patient undertands incorporating physical activity into lifestyle. Comprehends key points    Patient understands using medications safely. Demonstrates understanding / competency    Patient understands monitoring blood glucose, interpreting and using results Comprehends key points    Patient understands prevention, detection, and treatment of acute complications. Comprehends key points    Patient understands prevention, detection, and treatment of chronic complications. Comprehends key points    Patient understands how to develop strategies to address psychosocial issues. Comprehends key points    Patient understands how to develop strategies to promote health/change behavior. Comprehends key points      Outcomes   Expected Outcomes Demonstrated interest in learning. Expect positive outcomes    Future DMSE 2 months    Program Status Not Completed      Subsequent Visit   Since your last visit have you continued or begun to take your medications as prescribed? Yes    Since your last visit have you had your blood pressure checked? Yes    Is  your most recent blood pressure lower, unchanged, or higher since your last visit? Lower    Since your last visit have you experienced any weight changes? Loss    Weight Loss (lbs) 7    Since your last visit, are you checking your blood glucose at least once a day? Yes          Individualized Plan for Diabetes Self-Management Training:   Learning Objective:  Patient will have a greater understanding of diabetes self-management. Patient education plan is to attend individual and/or group sessions per assessed needs and concerns.   Plan:   Patient Instructions  Work towards consuming 64 oz of fluids daily, mainly plain water . Try to consume 2 of your water  bottles daily.  Choose the higher protein breakfast bars like [place], [place], and [employer] protein  Keep up the great work losing weight!! Continue to lose weight around 1-2 lbs per week.  Try having [place] Cherry capsules in the evening after dinner to try to sleep throughout the night!  Continue to look for fasting glucose numbers under 130 mg/dL!  Expected Outcomes:  Demonstrated  interest in learning. Expect positive outcomes  If problems or questions, patient to contact team via:  Phone and Email  Future DSME appointment: 2 months

## 2024-04-18 DIAGNOSIS — M67442 Ganglion, left hand: Secondary | ICD-10-CM | POA: Diagnosis not present

## 2024-04-24 ENCOUNTER — Encounter

## 2024-04-25 ENCOUNTER — Telehealth: Payer: Self-pay

## 2024-04-25 ENCOUNTER — Ambulatory Visit: Admitting: Student in an Organized Health Care Education/Training Program

## 2024-04-25 ENCOUNTER — Ambulatory Visit
Attending: Student in an Organized Health Care Education/Training Program | Admitting: Student in an Organized Health Care Education/Training Program

## 2024-04-25 ENCOUNTER — Encounter: Payer: Self-pay | Admitting: Student in an Organized Health Care Education/Training Program

## 2024-04-25 VITALS — BP 115/72 | HR 63 | Ht 67.0 in | Wt 232.1 lb

## 2024-04-25 DIAGNOSIS — I4819 Other persistent atrial fibrillation: Secondary | ICD-10-CM

## 2024-04-25 NOTE — Telephone Encounter (Signed)
 Spoke with patient. She stated she has a current finger infection and seeing a doctor 12/19 to discuss if she needs surgery in January. Advised patient if she needs to stop Eliquis  then concomitant PVI/LAAO would have to be postponed. Will speak with her after her appointment.  Patient was scheduled with Dr. Floretta 1/8 to establish with new general cardiologist as well as shared decision/pre-op. Will postpone if procedure day is pushed due to finger surgery.  Explained to patient she cannot have elective procedures for 6 months post implant.

## 2024-04-25 NOTE — Patient Instructions (Signed)
 Medication Instructions:  Your physician recommends that you continue on your current medications as directed. Please refer to the Current Medication list given to you today.  *If you need a refill on your cardiac medications before your next appointment, please call your pharmacy*  Lab Work: None ordered.  If you have labs (blood work) drawn today and your tests are completely normal, you will receive your results only by: MyChart Message (if you have MyChart) OR A paper copy in the mail If you have any lab test that is abnormal or we need to change your treatment, we will call you to review the results.  Testing/Procedures: Your physician has requested that you have Left atrial appendage (LAA) closure device implantation is a procedure to put a small device in the LAA of the heart. The LAA is a small sac in the wall of the heart's left upper chamber. Blood clots can form in this area. The device, Watchman closes the LAA to help prevent a blood clot and stroke.    Your physician has recommended that you have an ablation. Catheter ablation is a medical procedure used to treat some cardiac arrhythmias (irregular heartbeats). During catheter ablation, a long, thin, flexible tube is put into a blood vessel in your groin (upper thigh), or neck. This tube is called an ablation catheter. It is then guided to your heart through the blood vessel. Radio frequency waves destroy small areas of heart tissue where abnormal heartbeats may cause an arrhythmia to start. Please see the instruction sheet given to you today.    Follow-Up: At [facility], you and your health needs are our priority.  As part of our continuing mission to provide you with exceptional heart care, our providers are all part of one team.  This team includes your primary Cardiologist (physician) and Advanced Practice Providers or APPs (Physician Assistants and Nurse Practitioners) who all work together to provide you with the care  you need, when you need it.  Your next appointment:   Ablation/Watchman - 06/10/2024.  You will be contacted regarding scheduling and instructions.

## 2024-04-25 NOTE — Telephone Encounter (Signed)
 Per Dr. [name], arrange concomitant ablation/watchman. Tentative date 06/10/2024. Patient will need to be re-established with new Cardiologist since Dr. Celine departure for shared decision.

## 2024-04-25 NOTE — Progress Notes (Unsigned)
 Cardiology Office Note   Date:  04/25/2024  ID:  Brandy [name], Brandy [name] 08/03/1955, MRN [id number] PCP: Brandy Leita DEL, MD  [facility] Providers Cardiologist:  None Electrophysiologist:  Brandy DELENA Primus, MD    History of Present Illness Brandy [name] is a 68 y.o. female with longstanding persistent AF, NICM and DM2 who presents for AF management.  History of Present Illness She underwent an ablation procedure in September 2022 for atrial fibrillation. The procedure has been largely effective, with only a couple of atrial fibrillation episodes triggered by specific factors such as cortisone shots and quinine. A sustained atrial fibrillation episode post-ablation required cardioversion in September 2025.  She is currently on Eliquis  but wishes to discontinue it due to side effects, including significant hair loss and restrictions on taking supplements like turmeric and omega-3s, which she finds beneficial for her joint pain. Eliquis  also limits her ability to take ibuprofen for her bone-on-bone knee and hip pain.  Her past medical history includes diabetes, for which she takes metformin , and she is on metoprolol  for heart rate control. No history of heart failure, high blood pressure, stroke, or heart disease requiring catheterization.  She lives alone in [place], having moved there in 26-May-1984. She works part-time at [employer] in the [employer]. Her family includes two stepsons and a daughter, with five grandchildren living nearby. Her husband passed away in 1997-05-26.  She reports feeling 'weird' or 'off' during atrial fibrillation episodes but otherwise feels well. She reports no further atrial fibrillation episodes since the cardioversion in September 2025.  ROS: hair loss on apixaban    Studies Reviewed  ECG review 01/18/24: NSR 67, PR 202, QRS 76, QT/c 408/431 12/19/23: AF/VR 91, QRS 86, QT/c 326/400  03/13/23: NSR 63, PR 198, QRS 78, QT/c 386/395 01/06/21:  NSR   TTE Result date: 10/28/21  1. Left ventricular ejection fraction, by estimation, is 55 to 60%. The  left ventricle has normal function. The left ventricle has no regional  wall motion abnormalities. Left ventricular diastolic parameters were  normal. The average left ventricular  global longitudinal strain is -15.0 %.   2. Right ventricular systolic function is normal. The right ventricular  size is normal. There is normal pulmonary artery systolic pressure. The  estimated right ventricular systolic pressure is 26.5 mmHg.   3. Left atrial size was borderline mildly dilated.   4. The mitral valve is normal in structure. Mild mitral valve  regurgitation. No evidence of mitral stenosis.   5. The aortic valve is tricuspid. Aortic valve regurgitation is not  visualized. No aortic stenosis is present.   6. The inferior vena cava is normal in size with greater than 50%  respiratory variability, suggesting right atrial pressure of 3 mmHg.   Risk Assessment/Calculations  CHA2DS2-VASc Score = 3  This indicates a 3.2% annual risk of stroke. The patient's score is based upon: CHF History: 0 HTN History: 0 Diabetes History: 1 Stroke History: 0 Vascular Disease History: 0 Age Score: 1 Gender Score: 1  Physical Exam VS:  BP 115/72   Pulse 63   Ht 5' 7 (1.702 m)   Wt 232 lb 1.6 oz (105.3 kg)   SpO2 97%   BMI 36.35 kg/m   Wt Readings from Last 3 Encounters:  04/25/24 232 lb 1.6 oz (105.3 kg)  03/14/24 235 lb (106.6 kg)  02/20/24 238 lb 3.2 oz (108 kg)    GEN: Well nourished, well developed in no acute distress NECK: No JVD;  No carotid bruits CARDIAC: RRR, no murmurs, rubs, gallops RESPIRATORY:  Clear to auscultation without rales, wheezing or rhonchi  EXTREMITIES:  No edema; No deformity   ASSESSMENT AND PLAN Brandy [name] is a 68 y.o. female with longstanding persistent AF, NICM and DM2 who presents for AF management. Assessment & Plan Atrial fibrillation, post-ablation,  recurrent Recurrent atrial fibrillation post-ablation with episodes triggered by cortisone and quinine. Cardioversion required for sustained AFib. CHA2DS2-VASc score of 3 indicates moderate stroke risk. She wishes to discontinue Eliquis  due to side effects. Discussed stroke risks if AFib recurs and benefits of Watchman device for stroke risk mitigation and Eliquis  discontinuation. Explained Watchman risks including stroke, bleeding, and device complications. Emphasized shared decision-making considering her preference to avoid long-term anticoagulation.  She has an ILR for monitoring of recurrent AF and initially we had discussed that she had not had any recurrent AF post ablation and we could consider discontinuation of Eliquis  without any additional procedures.  She unfortunately has had 2 episodes of AF post ablation.  The first episode was related to cortisone injections the second related to the other supplements she was taking.  One of the episodes required cardioversion and was persistent.  Since she has had sustained AF outside of the 90-day post ablation window I do think that she needs an alternative stroke risk reduction strategy other than OAC if she wants to discontinue OAC.  Although she does not have significant AF burden she was interested in me taking a look at her prior PVI lesions which was RF PVI+PWI.  If she has PVI and PWI at baseline then will not perform any additional ablation but will schedule procedure as potential concomitant PVI+Watchman with the main goal for successful Watchman deployment to allow subsequent discontinuation of Eliquis . - Continue Eliquis  until Watchman procedure. - Schedule Watchman procedure, potentially in January, with consideration of ablation during the procedure to assess previous ablation site. - Use prior CT scan from 2022 for planning/LAA measurements with TruPlan  - Switch to aspirin  and Plavix post-Watchman procedure for six months, then transition to  baby aspirin  long-term. - Monitor ILR for AFib recurrence. - Discuss with Danielle, the coordinator, for scheduling and preparation for Watchman procedure.  I have seen Brandy [name] in the office today who is being considered for a Watchman left atrial appendage closure device. I believe they will benefit from this procedure given their history of atrial fibrillation, CHA2DS2-VASc score of 3 and unadjusted ischemic stroke rate of 3.2% per year. Unfortunately, the patient is not felt to be a long term anticoagulation candidate secondary to patient preference of d/c of OAC and associated side effects. The patient's chart has been reviewed and I feel that they would be a candidate for short term oral anticoagulation after Watchman implant.   It is my belief that after undergoing a LAA closure procedure, Brandy [name] will not need long term anticoagulation which eliminates anticoagulation side effects and major bleeding risk.   Procedural risks for the Watchman implant have been reviewed with the patient including a 0.5% risk of stroke, <1% risk of perforation and <1% risk of device embolization. Other risks include bleeding, vascular damage, tamponade, worsening renal function, and death. The patient understands these risk and wishes to proceed.    The published clinical data on the safety and effectiveness of WATCHMAN include but are not limited to the following: - Holmes DR, Jess BEARD, Sick P et al. for the PROTECT AF Investigators. Percutaneous closure of the left  atrial appendage versus warfarin therapy for prevention of stroke in patients with atrial fibrillation: a randomised non-inferiority trial. Lancet 2009; 374: 534-42. GLENWOOD Jess BEARD, Doshi SK, Jonita VEAR Satchel D et al. on behalf of the PROTECT AF Investigators. Percutaneous Left Atrial Appendage Closure for Stroke Prophylaxis in Patients With Atrial Fibrillation 2.3-Year Follow-up of the PROTECT AF (Watchman Left Atrial Appendage System for Embolic  Protection in Patients With Atrial Fibrillation) Trial. Circulation 2013; 127:720-729. - Alli O, Doshi S,  Kar S, Reddy VY, Sievert H et al. Quality of Life Assessment in the Randomized PROTECT AF (Percutaneous Closure of the Left Atrial Appendage Versus Warfarin Therapy for Prevention of Stroke in Patients With Atrial Fibrillation) Trial of Patients at Risk for Stroke With Nonvalvular Atrial Fibrillation. J Am Coll Cardiol 2013; 61:1790-8. GLENWOOD Satchel DR, Archer RAMAN, Price M, Whisenant B, Sievert H, Doshi S, Huber K, Reddy V. Prospective randomized evaluation of the Watchman left atrial appendage Device in patients with atrial fibrillation versus long-term warfarin therapy; the PREVAIL trial. Journal of the [employer] of Cardiology, Vol. 4, No. 1, 2014, 1-11. - Kar S, Doshi SK, Sadhu A, Horton R, Osorio J et al. Primary outcome evaluation of a next-generation left atrial appendage closure device: results from the PINNACLE FLX trial. Circulation 2021;143(18)1754-1762.   After today's visit with the patient which was dedicated solely for shared decision making visit regarding LAA closure device, the patient decided to proceed with the LAA appendage closure procedure.  Prior to the procedure, I would like to obtain a gated CT scan of the chest with contrast timed for PV/LA visualization.   HAS-BLED score 1 Hypertension No  Abnormal renal and liver function (Dialysis, transplant, Cr >2.26 mg/dL /Cirrhosis or Bilirubin >2x Normal or AST/ALT/AP >3x Normal) No  Stroke No  Bleeding No  Labile INR (Unstable/high INR) No  Elderly (>65) Yes  Drugs or alcohol (>= 8 drinks/week, anti-plt or NSAID) No   CHA2DS2-VASc Score = 3  The patient's score is based upon: CHF History: 0 HTN History: 0 Diabetes History: 1 Stroke History: 0 Vascular Disease History: 0 Age Score: 1 Gender Score: 1  NYHA class I  Procedure details:  Procedure date: 06/11/23 CT scan: use prior from 12/09/20, get TruPlan  Hold OAC  the morning of procedure, last dose night prior  Carto/ICE/GA  A total of 65 minutes was spent preparing for the patient, reviewing history, performing exam, document encounter, coordinating care and counseling the patient. 45 minutes was spent with direct patient care.   Dispo: RTC post concomitant relook PVI+Watchman   Signed, Brandy DELENA Primus, MD

## 2024-04-26 ENCOUNTER — Ambulatory Visit

## 2024-04-26 DIAGNOSIS — I4819 Other persistent atrial fibrillation: Secondary | ICD-10-CM | POA: Diagnosis not present

## 2024-04-28 LAB — CUP PACEART REMOTE DEVICE CHECK
Date Time Interrogation Session: 20251212231513
Implantable Pulse Generator Implant Date: 20230928

## 2024-04-29 ENCOUNTER — Telehealth: Payer: Self-pay

## 2024-04-29 NOTE — Telephone Encounter (Signed)
 CT 12/02/2020  Serpiginous appendage. Stitch artifact noted in CT images. Max 26/ AVG 23/ Depth 13.8 Likely use 27mm device. TruSteer recommended to navigate to usable depth. Inf/Mid TSP RAO 20 CAU

## 2024-05-01 ENCOUNTER — Ambulatory Visit: Payer: Self-pay | Admitting: Cardiology

## 2024-05-02 NOTE — Progress Notes (Signed)
 Remote Loop Recorder Transmission

## 2024-05-05 ENCOUNTER — Encounter: Payer: Self-pay | Admitting: Internal Medicine

## 2024-05-05 NOTE — Telephone Encounter (Signed)
 Spoke with patient. She had her OV with her finger doctor on 12/19. He prescribed more antibiotics because her finger still appears infected to him. She has repeat OV with him 12/29 and then will have surgery in January. She will have to hold Eliquis  3 days prior.   Explained to patient will postpone picking concomitant ablation/watchman date until elective procedures are complete. Will continue to follow up with her.   She will keep OV with Dr. Floretta 05/22/24 to establish with new general cardiologist and get Watchman shared decision.   Will send update to Dr. [name].

## 2024-05-13 NOTE — Telephone Encounter (Signed)
 Spoke with patient. She is scheduled for her finger procedure 05/22/24. She said it is an in office procedure so she still wants to keep her OV with Dr. Floretta for 05/22/24. Patient stated she is trying to get colonoscopy done in January but if the timing does not work she is okay to postpone that till 6 months post concomitant PVI/LAAO.  Patient wanted to know how long she needed to be on uninterrupted Eliquis  prior to concomitant procedure. Advised typically at least 1 month. Will confirm with Dr. [name].

## 2024-05-21 ENCOUNTER — Telehealth (HOSPITAL_BASED_OUTPATIENT_CLINIC_OR_DEPARTMENT_OTHER): Payer: Self-pay | Admitting: *Deleted

## 2024-05-21 NOTE — Telephone Encounter (Signed)
 Pt has appt 05/22/24 Dr. Floretta.

## 2024-05-21 NOTE — Telephone Encounter (Signed)
" ° °  Name: Brandy [name]  DOB: 1955-10-14  MRN: [id number]  Primary Cardiologist: None  Chart reviewed as part of pre-operative protocol coverage. Because of Brandy [name]'s past medical history and time since last visit, she will require a follow-up in-office visit in order to better assess preoperative cardiovascular risk. Patient has appointment with Dr. Floretta on 05/22/24.   Pre-op covering staff: - Please schedule appointment and call patient to inform them. If patient already had an upcoming appointment within acceptable timeframe, please add pre-op clearance to the appointment notes so provider is aware. - Please contact requesting surgeon's office via preferred method (i.e, phone, fax) to inform them of need for appointment prior to surgery.     Rollo FABIENE Louder, PA-C  05/21/2024, 10:59 AM   "

## 2024-05-21 NOTE — Telephone Encounter (Signed)
"  ° °  Pre-operative Risk Assessment    Patient Name: Brandy [name]  DOB: Jul 09, 1955 MRN: [id number]   Date of last office visit: 04/25/24 DR. CARLISLE Date of next office visit: 05/22/24 DR. SULLIVAN   Request for Surgical Clearance    Procedure:  COLONOSCOPY  Date of Surgery:  Clearance TBD                                Surgeon:  NOT LISTED  Surgeon's Group or Practice Name:  [facility] Phone number:  [phone] Fax number:  [phone] ATTN: MALLIE RATTLER, RN   Type of Clearance Requested:   - Medical  - Pharmacy:  Hold Apixaban  (Eliquis )     Type of Anesthesia:  Not Indicated (PROPOFOL ?)   Additional requests/questions:    [name]   05/21/2024, 10:30 AM   "

## 2024-05-22 ENCOUNTER — Encounter: Payer: Self-pay | Admitting: Student in an Organized Health Care Education/Training Program

## 2024-05-22 ENCOUNTER — Ambulatory Visit
Attending: Student in an Organized Health Care Education/Training Program | Admitting: Student in an Organized Health Care Education/Training Program

## 2024-05-22 VITALS — BP 128/68 | HR 94 | Ht 66.5 in | Wt 228.0 lb

## 2024-05-22 DIAGNOSIS — E1169 Type 2 diabetes mellitus with other specified complication: Secondary | ICD-10-CM

## 2024-05-22 DIAGNOSIS — E785 Hyperlipidemia, unspecified: Secondary | ICD-10-CM | POA: Diagnosis not present

## 2024-05-22 MED ORDER — METOPROLOL SUCCINATE ER 50 MG PO TB24: 50.0000 mg | ORAL_TABLET | Freq: Every day | ORAL | 3 refills | Status: AC

## 2024-05-22 NOTE — Telephone Encounter (Signed)
 Note has been faxed.

## 2024-05-22 NOTE — Progress Notes (Signed)
 " Cardiology Office Note:   Date:  05/22/2024  ID:  [name], DOB [birth year], MRN [id number] PCP: Justus Leita DEL, MD  [facility] Providers Cardiologist:  Georganna Archer, MD Electrophysiologist:  Donnice DELENA Primus, MD { Chief Complaint:  Chief Complaint  Patient presents with   Atrial Fibrillation      History of Present Illness:   Brandy [name] is a 69 y.o. female with a PMH of longstanding persistent AF (on Eliquis ), HTN, HLD, HFimpEF, obesity, OSA who presents for follow up.  It is my first time meeting the patient today.  Overall she is doing well and has no complaints.  She denies chest pain, SOB, PND, orthopnea, swelling.  She has occasional palpitations which have been going on for many years and have been attributed to PVCs.  She follows with Dr. Primus and is going to undergo a Watchman procedure for atrial fibrillation.  Additionally, the patient is scheduled to undergo a colonoscopy later this month and needs guidance on what to do with her Eliquis .  She takes all her medications as prescribed without side effect.   Past Medical History:  Diagnosis Date   Acute lower GI bleeding 01/19/2020   Arthritis    Atrial fibrillation with rapid ventricular response (HCC)    Chicken pox    Chronic systolic CHF (congestive heart failure) (HCC)    a. 03/2016 Echo: Ef 15-20%, diff HK, ant AK;  b. 05/2016 Echo: Ef 30-35%, diff HK, mildly dil LA/RA.   Diabetes mellitus without complication (HCC)    Dysrhythmia    GERD (gastroesophageal reflux disease)    Heart murmur    Hip discomfort    been going on for 40 years    History of kidney stones    Hyperlipidemia associated with type 2 diabetes mellitus (HCC) 05/12/2021   Hypertension    Moderate mitral regurgitation    a. 03/2016 Echo: mod MR in setting of LV dysfxn.   Motion sickness    car - back seat   NICM (nonischemic cardiomyopathy) (HCC)    a. 03/2016 Echo: EF 15-20%, diff HK, ant AK, mod MR, mod dil LA, mildly dil  RA;  b. 04/2016 Cath: nl cors;  c. 05/2016 Echo: EF 30-35%, diff HK.   Obesity    Obstructive sleep apnea    compliant with CPAP   Persistent atrial fibrillation (HCC)    a. Dx 03/2016;  b. CHA2DS2VASc = 2-->Eliquis  5mg  BID;  b. 05/2016 Failed DCCV x 4.   Sleep apnea    Stomach irritation    Visit for monitoring Tikosyn  therapy 07/24/2016     Studies Reviewed:    EKG: No new ECG       Cardiac Studies & Procedures   ______________________________________________________________________________________________ CARDIAC CATHETERIZATION  CARDIAC CATHETERIZATION 04/17/2016  Conclusion  LV end diastolic pressure is normal.  1. Normal coronary arteries. 2. Severely dilated left ventricle with Normal left ventricular end-diastolic pressure. Left ventricular angiography was not performed. EF was severely dilated by echo.  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 Continue medical therapy. I am going to start anticoagulation with Eliquis  this evening. If ventricular rate is controlled, the patient can be discharged home with rate control and anticoagulation with plans for outpatient cardioversion in 3 weeks. Otherwise, if the rate is not controlled, recommend TEE guided cardioversion tomorrow.  Findings Coronary Findings Diagnostic  Dominance: Right  Left Main Vessel is angiographically normal.  Left Anterior Descending Vessel is angiographically normal.  First Diagonal Branch Vessel is angiographically normal.  Third Diagonal Branch Vessel is small in size. Vessel is angiographically normal.  Left Circumflex Vessel is angiographically normal.  First Obtuse Marginal Branch Vessel is angiographically normal.  Second Obtuse Marginal Branch Vessel is angiographically normal.  Third Obtuse Marginal Branch Vessel is angiographically normal.  Right Coronary Artery Vessel is angiographically normal.  Right  Posterior Descending Artery Vessel is angiographically normal.  Right Posterior Atrioventricular Artery Vessel is small in size. Vessel is angiographically normal.  First Right Posterolateral Branch Vessel is angiographically normal.  Second Right Posterolateral Branch Vessel is angiographically normal.  Third Right Posterolateral Branch Vessel is angiographically normal.  Intervention  No interventions have been documented.     ECHOCARDIOGRAM  ECHOCARDIOGRAM COMPLETE 10/28/2021  Narrative ECHOCARDIOGRAM REPORT    Patient Name:   Brandy [name] Date of Exam: 10/28/2021 Medical Rec #:  [id number]   Height:       67.0 in Accession #:    [id number]  Weight:       220.0 lb Date of Birth:  [birth year]    BSA:          2.106 m Patient Age:    66 years    BP:           128/70 mmHg Patient Gender: F           HR:           70 bpm. Exam Location:    Procedure: 2D Echo, 3D Echo, Cardiac Doppler, Color Doppler and Strain Analysis  Indications:    I42.80 Non-ischemic cardiomyopathy  History:        Patient has prior history of Echocardiogram examinations, most recent 09/21/2020. Cardiomyopathy and CHF, Arrythmias:Atrial Fibrillation; Risk Factors:Dyslipidemia, Diabetes, h/o hypotension and Non-Smoker. H/o ablation.  Sonographer:    Arley Pac RDMS, RVT, RDCS Referring Phys: [id number] ELSPETH JAYSON SAGE  IMPRESSIONS   1. Left ventricular ejection fraction, by estimation, is 55 to 60%. The left ventricle has normal function. The left ventricle has no regional wall motion abnormalities. Left ventricular diastolic parameters were normal. The average left ventricular global longitudinal strain is -15.0 %. 2. Right ventricular systolic function is normal. The right ventricular size is normal. There is normal pulmonary artery systolic pressure. The estimated right ventricular systolic pressure is 26.5 mmHg. 3. Left atrial size was borderline mildly dilated. 4. The mitral valve is  normal in structure. Mild mitral valve regurgitation. No evidence of mitral stenosis. 5. The aortic valve is tricuspid. Aortic valve regurgitation is not visualized. No aortic stenosis is present. 6. The inferior vena cava is normal in size with greater than 50% respiratory variability, suggesting right atrial pressure of 3 mmHg.  FINDINGS Left Ventricle: Left ventricular ejection fraction, by estimation, is 55 to 60%. The left ventricle has normal function. The left ventricle has no regional wall motion abnormalities. The average left ventricular global longitudinal strain is -15.0 %. The left ventricular internal cavity size was normal in size. There is no left ventricular hypertrophy. Left ventricular diastolic parameters were normal.  Right Ventricle: The right ventricular size is normal. No increase in right ventricular wall thickness. Right ventricular systolic function is normal. There is normal pulmonary artery systolic pressure. The tricuspid regurgitant velocity is 2.32 m/s, and with an assumed right atrial pressure of 5 mmHg, the estimated right ventricular systolic pressure is 26.5 mmHg.  Left Atrium: Left atrial size was mildly dilated.  Right Atrium: Right atrial size was normal in size.  Pericardium:  There is no evidence of pericardial effusion.  Mitral Valve: The mitral valve is normal in structure. Mild mitral valve regurgitation. No evidence of mitral valve stenosis.  Tricuspid Valve: The tricuspid valve is normal in structure. Tricuspid valve regurgitation is mild . No evidence of tricuspid stenosis.  Aortic Valve: The aortic valve is tricuspid. Aortic valve regurgitation is not visualized. No aortic stenosis is present.  Pulmonic Valve: The pulmonic valve was normal in structure. Pulmonic valve regurgitation is mild. No evidence of pulmonic stenosis.  Aorta: The aortic root is normal in size and structure.  Venous: The inferior vena cava is normal in size with greater  than 50% respiratory variability, suggesting right atrial pressure of 3 mmHg.  IAS/Shunts: No atrial level shunt detected by color flow Doppler.   LEFT VENTRICLE PLAX 2D LVIDd:         4.90 cm     Diastology LVIDs:         3.20 cm     LV e' medial:    5.55 cm/s LV PW:         1.00 cm     LV E/e' medial:  16.2 LV IVS:        0.90 cm     LV e' lateral:   8.27 cm/s LVOT diam:     2.00 cm     LV E/e' lateral: 10.9 LV SV:         77 LV SV Index:   36          2D Longitudinal Strain LVOT Area:     3.14 cm    2D Strain GLS Avg:     -15.0 %  LV Volumes (MOD) LV vol d, MOD A2C: 86.4 ml 3D Volume EF: LV vol d, MOD A4C: 88.0 ml 3D EF:        58 % LV vol s, MOD A2C: 40.5 ml LV EDV:       124 ml LV vol s, MOD A4C: 43.2 ml LV ESV:       52 ml LV SV MOD A2C:     45.9 ml LV SV:        72 ml LV SV MOD A4C:     88.0 ml LV SV MOD BP:      45.3 ml  RIGHT VENTRICLE             IVC RV Basal diam:  4.60 cm     IVC diam: 2.30 cm RV Mid diam:    3.60 cm RV S prime:     11.50 cm/s  LEFT ATRIUM             Index        RIGHT ATRIUM           Index LA diam:        4.70 cm 2.23 cm/m   RA Area:     17.20 cm LA Vol (A2C):   64.8 ml 30.77 ml/m  RA Volume:   43.20 ml  20.51 ml/m LA Vol (A4C):   46.3 ml 21.99 ml/m LA Biplane Vol: 57.0 ml 27.07 ml/m AORTIC VALVE             PULMONIC VALVE LVOT Vmax:   101.00 cm/s PV Vmax:       1.29 m/s LVOT Vmean:  69.600 cm/s PV Peak grad:  6.7 mmHg LVOT VTI:    0.244 m  AORTA Ao Root diam: 2.80 cm Ao Asc diam:  3.20 cm Ao  Arch diam: 2.5 cm  MITRAL VALVE               TRICUSPID VALVE MV Area (PHT): 3.91 cm    TR Peak grad:   21.5 mmHg MV Decel Time: 194 msec    TR Vmax:        232.00 cm/s MV E velocity: 90.00 cm/s MV A velocity: 61.30 cm/s  SHUNTS MV E/A ratio:  1.47        Systemic VTI:  0.24 m Systemic Diam: 2.00 cm  Evalene Lunger MD Electronically signed by Evalene Lunger MD Signature Date/Time: 10/28/2021/5:48:16 PM    Final     MONITORS  LONG TERM MONITOR (3-14 DAYS) 10/23/2018  Narrative Images from the original result were not included. Indication afib and rate control  Duration:5d  Findings Avg HR  102 , range 60-180 Mean during day and with activity 110-140  Rec 1) 2 d Echo to reassess LV function with rapid rates 2) begin dig 0.25 mg, bid x 2 days then qd 3) dig level and Bmet in 2 weeks       ______________________________________________________________________________________________      Risk Assessment/Calculations:    CHA2DS2-VASc Score = 3   This indicates a 3.2% annual risk of stroke. The patient's score is based upon: CHF History: 0 HTN History: 0 Diabetes History: 1 Stroke History: 0 Vascular Disease History: 0 Age Score: 1 Gender Score: 1             Physical Exam:     VS:  BP 128/68   Pulse 94   Ht 5' 6.5 (1.689 m)   Wt 228 lb (103.4 kg)   SpO2 94%   BMI 36.25 kg/m      Wt Readings from Last 3 Encounters:  04/25/24 232 lb 1.6 oz (105.3 kg)  03/14/24 235 lb (106.6 kg)  02/20/24 238 lb 3.2 oz (108 kg)     GEN: Well nourished, well developed, in no acute distress NECK: No JVD; No carotid bruits CARDIAC: RRR, no murmurs, rubs, gallops RESPIRATORY:  Clear to auscultation without rales, wheezing or rhonchi  ABDOMEN: Soft, non-tender, non-distended, normal bowel sounds EXTREMITIES:  Warm and well perfused, no edema; No deformity, 2+ radial pulses PSYCH: Normal mood and affect   Assessment & Plan  #Atrial Fibrillation - Patient is followed by EP for her AF.  Currently doing well and is going to undergo a Watchman procedure in the near future. - She is scheduled to undergo colonoscopy at the end of this month. - She should hold her Eliquis  for 48 hours prior to her colonoscopy.  She is safe to resume her Eliquis  post colonoscopy at the discretion of her GI physician. - No additional cardiovascular evaluation is required prior to undergoing  colonoscopy.  #HFimpEF - She was diagnosed with CHF with an EF of 15% back in 2017 in the setting of atrial fibrillation with RVR.  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 - Since 2017 she has had complete recovery of her LVEF on both Farxiga  and metoprolol .  Additional GDMT was not initiated due to hypotension in the past. - Currently the patient is well compensated and has no complaints. - I will transition her metoprolol  to metoprolol  succinate as this is the preferred formulation for CHF treatment. Stop metoprolol  tartrate Start metoprolol  succinate 50 mg once a day Continue dapagliflozin  10 mg daily Her blood pressures are WNL and her EF is  normal so will not initiate additional GDMT at this time. Follow-up in 6 months  #HTN - BP is at goal.  No changes.   #HLD - Will check lipid panel and lipoprotein a today for risk stratification. Lipoprotein a, lipid panel         This note was written with the assistance of a dictation microphone or AI dictation software. Please excuse any typos or grammatical errors.   Signed, Georganna Archer, MD 05/22/2024 1:19 PM    [facility]  "

## 2024-05-22 NOTE — Patient Instructions (Signed)
 Medication Instructions:   STOP METOPROLOL  TARTRATE  START METOPROLOL  SUCC ER 50 MG ONCE DAILY AT BEDTIME   *If you need a refill on your cardiac medications before your next appointment, please call your pharmacy*  Follow-Up: At [facility], you and your health needs are our priority.  As part of our continuing mission to provide you with exceptional heart care, our providers are all part of one team.  This team includes your primary Cardiologist (physician) and Advanced Practice Providers or APPs (Physician Assistants and Nurse Practitioners) who all work together to provide you with the care you need, when you need it.  Your next appointment:   6 month(s)  Provider:   GEORGANNA ARCHER MD   Other Instructions  HOLD ELIQUIS  48 HOURS PRIOR TO COLONOSCOPY AND RESTART WHEN OKAY WITH GI

## 2024-05-23 ENCOUNTER — Telehealth: Payer: Self-pay

## 2024-05-23 ENCOUNTER — Ambulatory Visit: Payer: Self-pay | Admitting: Student in an Organized Health Care Education/Training Program

## 2024-05-23 DIAGNOSIS — E1169 Type 2 diabetes mellitus with other specified complication: Secondary | ICD-10-CM

## 2024-05-23 NOTE — Telephone Encounter (Signed)
 [name]

## 2024-05-23 NOTE — Telephone Encounter (Signed)
 Spoke with patient. She is scheduled for colonoscopy 06/12/24. Explained to patient ideally like uninterrupted OAC for 1 month prior. Discussed tentative date of 3/16. Will follow up after colonoscopy.

## 2024-05-25 ENCOUNTER — Encounter

## 2024-05-25 LAB — LIPID PANEL
Chol/HDL Ratio: 5.3 ratio — ABNORMAL HIGH (ref 0.0–4.4)
Cholesterol, Total: 205 mg/dL — ABNORMAL HIGH (ref 100–199)
HDL: 39 mg/dL — ABNORMAL LOW
LDL Chol Calc (NIH): 89 mg/dL (ref 0–99)
Triglycerides: 473 mg/dL — ABNORMAL HIGH (ref 0–149)
VLDL Cholesterol Cal: 77 mg/dL — ABNORMAL HIGH (ref 5–40)

## 2024-05-25 LAB — LIPOPROTEIN A (LPA)

## 2024-05-27 ENCOUNTER — Telehealth: Payer: Self-pay

## 2024-05-27 ENCOUNTER — Ambulatory Visit

## 2024-05-27 ENCOUNTER — Encounter: Payer: Self-pay | Admitting: Student

## 2024-05-27 ENCOUNTER — Ambulatory Visit: Admitting: Student

## 2024-05-27 VITALS — BP 110/74 | HR 72 | Ht 66.5 in | Wt 227.0 lb

## 2024-05-27 DIAGNOSIS — E1169 Type 2 diabetes mellitus with other specified complication: Secondary | ICD-10-CM

## 2024-05-27 DIAGNOSIS — Z1231 Encounter for screening mammogram for malignant neoplasm of breast: Secondary | ICD-10-CM | POA: Diagnosis not present

## 2024-05-27 DIAGNOSIS — E785 Hyperlipidemia, unspecified: Secondary | ICD-10-CM

## 2024-05-27 DIAGNOSIS — I4819 Other persistent atrial fibrillation: Secondary | ICD-10-CM

## 2024-05-27 DIAGNOSIS — Z7984 Long term (current) use of oral hypoglycemic drugs: Secondary | ICD-10-CM

## 2024-05-27 DIAGNOSIS — I428 Other cardiomyopathies: Secondary | ICD-10-CM | POA: Diagnosis not present

## 2024-05-27 DIAGNOSIS — E118 Type 2 diabetes mellitus with unspecified complications: Secondary | ICD-10-CM | POA: Diagnosis not present

## 2024-05-27 LAB — CUP PACEART REMOTE DEVICE CHECK
Date Time Interrogation Session: 20260112231354
Implantable Pulse Generator Implant Date: 20230928

## 2024-05-27 LAB — POCT GLYCOSYLATED HEMOGLOBIN (HGB A1C): Hemoglobin A1C: 5.8 % — AB (ref 4.0–5.6)

## 2024-05-27 MED ORDER — LANCETS MISC: 1.0000 | 3 refills | Status: AC

## 2024-05-27 MED ORDER — LANCET DEVICE MISC: 1.0000 | 0 refills | Status: AC

## 2024-05-27 MED ORDER — BLOOD GLUCOSE MONITORING SUPPL DEVI: 1.0000 | 0 refills | Status: AC

## 2024-05-27 MED ORDER — BLOOD GLUCOSE TEST VI STRP: 1.0000 | ORAL_STRIP | 3 refills | Status: AC

## 2024-05-27 MED ORDER — METFORMIN HCL ER 500 MG PO TB24: 500.0000 mg | ORAL_TABLET | Freq: Every day | ORAL | 1 refills | Status: AC

## 2024-05-27 NOTE — Assessment & Plan Note (Addendum)
 Currently on metformin  500 mg daily and farxiga  10 mg daily. Fasting glucoses typically 130. A1c improved to 5.8% today. Is working with diabetes educator. B12 and urine microalbumin today.Foot exam performed today.

## 2024-05-27 NOTE — Assessment & Plan Note (Signed)
 Currently on Eliquis  plan for watchman in March following with EP Dr. [name]

## 2024-05-27 NOTE — Patient Instructions (Signed)
 Please call to schedule your mammogram at [phone]

## 2024-05-27 NOTE — Progress Notes (Signed)
 "  Established Patient Office Visit  Subjective   Patient ID: Brandy [name], female    DOB: 1955/07/04  Age: 69 y.o. MRN: [id number]  Chief Complaint  Patient presents with   Diabetes    Brandy [name] is a 69 y.o. person with medical hx listed below who presents today for diabetes follow up. Previously seeing Dr. Justus. Please refer to problem based charting for further details and assessment and plan of current problem and chronic medical conditions.   Patient Active Problem List   Diagnosis Date Noted   Mucous cyst of digit of left hand 05/18/2023   Peroneal tendinitis, left 10/13/2022   Left lumbar radiculopathy 07/26/2022   Arthralgia of left knee 07/26/2022   Pes anserinus bursitis of right knee 04/19/2022   Osteoarthritis of both knees 03/27/2022   Greater trochanteric pain syndrome of both lower extremities 03/27/2022   It band syndrome, right 03/27/2022   Hyperlipidemia associated with type 2 diabetes mellitus (HCC) 05/12/2021   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   Varicose veins of leg with pain, bilateral 07/06/2020   Type II diabetes mellitus with complication (HCC) 01/19/2020   Gastric polyp    Colon polyps    Osteoarthritis of right hand 10/31/2016   Nonischemic cardiomyopathy (HCC) 04/14/2016   BMI 33.0-33.9,adult 04/14/2016   Persistent atrial fibrillation (HCC) 04/12/2016      ROS Refer to HPI    Objective:     Outpatient Encounter Medications as of 05/27/2024  Medication Sig   apixaban  (ELIQUIS ) 5 MG TABS tablet Take 1 tablet (5 mg total) by mouth 2 (two) times daily.   Blood Glucose Monitoring Suppl DEVI 1 each by Does not apply route as directed. Dispense based on patient and insurance preference. Use up to four times daily as directed. (FOR ICD-10 E10.9, E11.9).   Cholecalciferol  (VITAMIN D3) 1000 units CAPS Take 1,000 Units by mouth 2 (two) times daily.   dapagliflozin  propanediol (FARXIGA ) 10 MG TABS tablet TAKE 1 TABLET DAILY BEFORE  BREAKFAST   fluticasone  (FLONASE ) 50 MCG/ACT nasal spray Place 2 sprays into both nostrils daily. (Patient taking differently: Place 2 sprays into both nostrils as needed.)   furosemide  (LASIX ) 20 MG tablet Take 1 tablet (20 mg total) by mouth daily as needed for fluid or edema.   Glucose Blood (BLOOD GLUCOSE TEST STRIPS) STRP 1 each by Does not apply route as directed. Dispense based on patient and insurance preference. Use up to four times daily as directed. (FOR ICD-10 E10.9, E11.9).   Lancet Device MISC 1 each by Does not apply route as directed. Dispense based on patient and insurance preference. Use up to four times daily as directed. (FOR ICD-10 E10.9, E11.9).   Lancets MISC 1 each by Does not apply route as directed. Dispense based on patient and insurance preference. Use up to four times daily as directed. (FOR ICD-10 E10.9, E11.9).   Magnesium Bisglycinate (MAG GLYCINATE) 100 MG TABS Take 240 mg by mouth 2 (two) times daily.   Multiple Vitamins-Minerals (MULTIVITAMIN WITH MINERALS) tablet Take 1 tablet by mouth daily.   OVER THE COUNTER MEDICATION Apply 1 Application topically daily as needed. [employer]   peppermint oil liquid Apply 1 application topically daily as needed (itchy/ cold symptoms).   vitamin B-12 (CYANOCOBALAMIN) 500 MCG tablet Take 500 mcg by mouth daily.   vitamin C (ASCORBIC ACID) 500 MG tablet Take 500 mg by mouth daily.    [DISCONTINUED] glucose blood (ONETOUCH VERIO) test strip USE 1 STRIP TO CHECK  GLUCOSE 4 TIMES DAILY AS  DIRECTED   [DISCONTINUED] metFORMIN  (GLUCOPHAGE -XR) 500 MG 24 hr tablet Take 1/2 (one-half) tablet by mouth twice daily   [DISCONTINUED] traMADol (ULTRAM) 50 MG tablet Take 50 mg by mouth every 6 (six) hours as needed.   metFORMIN  (GLUCOPHAGE -XR) 500 MG 24 hr tablet Take 1 tablet (500 mg total) by mouth daily with breakfast. Take 1/2 (one-half) tablet by mouth twice daily   metoprolol  succinate (TOPROL -XL) 50 MG 24 hr tablet Take 1 tablet (50 mg  total) by mouth daily. Take with or immediately following a meal. (Patient not taking: Reported on 05/27/2024)   [DISCONTINUED] Lancets (ONETOUCH DELICA PLUS LANCET33G) MISC USE 1  TO CHECK GLUCOSE 4 TIMES DAILY AS DIRECTED (Patient not taking: Reported on 05/27/2024)   No facility-administered encounter medications on file as of 05/27/2024.    BP 110/74   Pulse 72   Ht 5' 6.5 (1.689 m)   Wt 227 lb (103 kg)   SpO2 97%   BMI 36.09 kg/m  BP Readings from Last 3 Encounters:  05/27/24 110/74  05/22/24 128/68  04/25/24 115/72    Physical Exam Constitutional:      Appearance: Normal appearance.  HENT:     Mouth/Throat:     Mouth: Mucous membranes are moist.     Pharynx: Oropharynx is clear.  Cardiovascular:     Rate and Rhythm: Normal rate and regular rhythm.     Comments: Normal PT and DP pulses  Pulmonary:     Effort: Pulmonary effort is normal.     Breath sounds: No rhonchi or rales.  Abdominal:     General: Abdomen is flat. Bowel sounds are normal. There is no distension.     Palpations: Abdomen is soft.     Tenderness: There is no abdominal tenderness.  Skin:    General: Skin is warm and dry.     Capillary Refill: Capillary refill takes less than 2 seconds.  Neurological:     General: No focal deficit present.     Mental Status: She is alert and oriented to person, place, and time.  Psychiatric:        Mood and Affect: Mood normal.        Behavior: Behavior normal.    Diabetic Foot Exam - Simple   Simple Foot Form Diabetic Foot exam was performed with the following findings: Yes 05/27/2024  9:23 AM  Visual Inspection See comments: Yes Sensation Testing Intact to touch and monofilament testing bilaterally: Yes Pulse Check Posterior Tibialis and Dorsalis pulse intact bilaterally: Yes Comments Calluses on both feet        05/27/2024    9:04 AM 03/14/2024   11:10 AM 02/07/2024    3:22 PM  Depression screen PHQ 2/9  Decreased Interest 0 0 0  Down, Depressed,  Hopeless 0 0 0  PHQ - 2 Score 0 0 0  Altered sleeping  1   Tired, decreased energy  1   Change in appetite  0   Feeling bad or failure about yourself   0   Trouble concentrating  0   Moving slowly or fidgety/restless  0   Suicidal thoughts  0   PHQ-9 Score  2    Difficult doing work/chores  Not difficult at all      Data saved with a previous flowsheet row definition       05/27/2024    9:04 AM 03/14/2024   11:10 AM 01/25/2024    9:35 AM 09/14/2023  9:50 AM  GAD 7 : Generalized Anxiety Score  Nervous, Anxious, on Edge 0 0 2 1  Control/stop worrying 0 0 1 0  Worry too much - different things  0 1 0  Trouble relaxing  0 1 0  Restless  0 1 0  Easily annoyed or irritable  0 0 0  Afraid - awful might happen  0 1 0  Total GAD 7 Score  0 7 1  Anxiety Difficulty  Not difficult at all Somewhat difficult Not difficult at all    Results for orders placed or performed in visit on 05/27/24  POCT HgB A1C  Result Value Ref Range   Hemoglobin A1C 5.8 (A) 4.0 - 5.6 %    Last CBC Lab Results  Component Value Date   WBC 13.8 (H) 12/19/2023   HGB 16.9 (H) 12/19/2023   HCT 50.7 (H) 12/19/2023   MCV 93 12/19/2023   MCH 31.1 12/19/2023   RDW 12.5 12/19/2023   PLT 413 12/19/2023   Last metabolic panel Lab Results  Component Value Date   GLUCOSE 86 12/19/2023   NA 139 12/19/2023   K 5.1 12/19/2023   CL 101 12/19/2023   CO2 22 12/19/2023   BUN 21 12/19/2023   CREATININE 0.93 12/19/2023   EGFR 67 12/19/2023   CALCIUM  10.0 12/19/2023   PROT 7.3 05/18/2023   ALBUMIN 4.5 05/18/2023   LABGLOB 2.8 05/18/2023   AGRATIO 1.7 03/20/2022   BILITOT 0.4 05/18/2023   ALKPHOS 87 05/18/2023   AST 17 05/18/2023   ALT 22 05/18/2023   ANIONGAP 11 02/12/2023   Last lipids Lab Results  Component Value Date   CHOL 205 (H) 05/22/2024   HDL 39 (L) 05/22/2024   LDLCALC 89 05/22/2024   LDLDIRECT 131.0 10/31/2016   TRIG 473 (H) 05/22/2024   CHOLHDL 5.3 (H) 05/22/2024   Last thyroid   functions Lab Results  Component Value Date   TSH 2.260 05/18/2023   FREET4 1.18 10/20/2019      The 10-year ASCVD risk score (Arnett DK, et al., 2019) is: 17.2%    Assessment & Plan:  Type II diabetes mellitus with complication (HCC) Assessment & Plan: Currently on metformin  500 mg daily and farxiga  10 mg daily. Fasting glucoses typically 130. A1c improved to 5.8% today. Is working with diabetes educator. B12 and urine microalbumin today.Foot exam performed today.   Orders: -     POCT glycosylated hemoglobin (Hb A1C) -     metFORMIN  HCl ER; Take 1 tablet (500 mg total) by mouth daily with breakfast. Take 1/2 (one-half) tablet by mouth twice daily  Dispense: 90 tablet; Refill: 1 -     Microalbumin / creatinine urine ratio -     Vitamin B12  Screening mammogram for breast cancer -     3D Screening Mammogram, Left and Right  Hyperlipidemia associated with type 2 diabetes mellitus (HCC) Assessment & Plan: Has pending lipoprotein a with cardiology, does not want to take a statin. Follow up with cardiology.   Persistent atrial fibrillation [facility]) Assessment & Plan: Currently on Eliquis  plan for watchman in March following with EP Dr. [name] Law term current use of oral hypoglycemic drug  Nonischemic cardiomyopathy [facility]) Assessment & Plan: Last echo 2023 EF 55 to 60%. Currently on metoprolol  tartrate and farxiga , no ARB due to hypotension. Managed by Cardiology. Recently switched to metoprolol  succinate which she has not started. Discussed she can take it with current medications. Encouraged her to start metoprolol  succinate and  stop metoprolol  tartrate.    Other orders -     Blood Glucose Monitoring Suppl; 1 each by Does not apply route as directed. Dispense based on patient and insurance preference. Use up to four times daily as directed. (FOR ICD-10 E10.9, E11.9).  Dispense: 1 each; Refill: 0 -     Blood Glucose Test; 1 each by Does not apply route as directed. Dispense  based on patient and insurance preference. Use up to four times daily as directed. (FOR ICD-10 E10.9, E11.9).  Dispense: 100 strip; Refill: 3 -     Lancet Device; 1 each by Does not apply route as directed. Dispense based on patient and insurance preference. Use up to four times daily as directed. (FOR ICD-10 E10.9, E11.9).  Dispense: 1 each; Refill: 0 -     Lancets; 1 each by Does not apply route as directed. Dispense based on patient and insurance preference. Use up to four times daily as directed. (FOR ICD-10 E10.9, E11.9).  Dispense: 100 each; Refill: 3     Return in about 4 months (around 09/24/2024) for physical.    Harlene Saddler, MD "

## 2024-05-27 NOTE — Assessment & Plan Note (Addendum)
 Last echo 2023 EF 55 to 60%. Currently on metoprolol  tartrate and farxiga , no ARB due to hypotension. Managed by Cardiology. Recently switched to metoprolol  succinate which Brandy [name] has not started. Discussed Brandy [name] can take it with current medications. Encouraged her to start metoprolol  succinate and stop metoprolol  tartrate.

## 2024-05-27 NOTE — Telephone Encounter (Signed)
 Copied from CRM [id number]. Topic: Clinical - Prescription Issue >> May 27, 2024  9:19 AM Amy B wrote: Reason for CRM: [facility] pharmacy is requesting a call back for clarification of metFORMIN  (GLUCOPHAGE -XR) 500 MG 24 hr tablet.   Please call [phone]

## 2024-05-27 NOTE — Assessment & Plan Note (Signed)
 Has pending lipoprotein a with cardiology, does not want to take a statin. Follow up with cardiology.

## 2024-05-28 ENCOUNTER — Ambulatory Visit: Payer: Self-pay | Admitting: Student

## 2024-05-28 LAB — MICROALBUMIN / CREATININE URINE RATIO
Creatinine, Urine: 118.6 mg/dL
Microalb/Creat Ratio: 4 mg/g{creat} (ref 0–29)
Microalbumin, Urine: 4.7 ug/mL

## 2024-05-28 LAB — VITAMIN B12: Vitamin B-12: 610 pg/mL (ref 232–1245)

## 2024-05-29 NOTE — Addendum Note (Signed)
 Addended by: LEMON RAISIN on: 05/29/2024 12:15 PM   Modules accepted: Level of Service

## 2024-05-30 NOTE — Progress Notes (Signed)
 Remote Loop Recorder Transmission

## 2024-05-30 NOTE — Progress Notes (Signed)
 MyChart message containing providers result note and interpretation read by patient nw:Ojdu read by Shawnee ONEIDA Ada at 3:47PM on 05/27/2024.

## 2024-06-02 ENCOUNTER — Encounter: Payer: Self-pay | Admitting: Student

## 2024-06-03 ENCOUNTER — Encounter: Payer: Self-pay | Admitting: Family Medicine

## 2024-06-03 ENCOUNTER — Other Ambulatory Visit: Payer: Self-pay

## 2024-06-03 ENCOUNTER — Ambulatory Visit (INDEPENDENT_AMBULATORY_CARE_PROVIDER_SITE_OTHER): Admitting: Family Medicine

## 2024-06-03 ENCOUNTER — Telehealth: Payer: Self-pay

## 2024-06-03 VITALS — BP 108/58 | HR 66 | Ht 66.5 in | Wt 234.0 lb

## 2024-06-03 DIAGNOSIS — M25551 Pain in right hip: Secondary | ICD-10-CM | POA: Diagnosis not present

## 2024-06-03 DIAGNOSIS — M170 Bilateral primary osteoarthritis of knee: Secondary | ICD-10-CM | POA: Diagnosis not present

## 2024-06-03 DIAGNOSIS — M25552 Pain in left hip: Secondary | ICD-10-CM

## 2024-06-03 DIAGNOSIS — M7672 Peroneal tendinitis, left leg: Secondary | ICD-10-CM | POA: Diagnosis not present

## 2024-06-03 DIAGNOSIS — G8929 Other chronic pain: Secondary | ICD-10-CM | POA: Diagnosis not present

## 2024-06-03 DIAGNOSIS — E118 Type 2 diabetes mellitus with unspecified complications: Secondary | ICD-10-CM

## 2024-06-03 DIAGNOSIS — M25571 Pain in right ankle and joints of right foot: Secondary | ICD-10-CM

## 2024-06-03 MED ORDER — DAPAGLIFLOZIN PROPANEDIOL 10 MG PO TABS: 10.0000 mg | ORAL_TABLET | Freq: Every day | ORAL | 1 refills | Status: AC

## 2024-06-03 NOTE — Assessment & Plan Note (Signed)
 [name]

## 2024-06-03 NOTE — Assessment & Plan Note (Signed)
 Brandy [name]

## 2024-06-03 NOTE — Patient Instructions (Signed)
" °  VISIT SUMMARY: During your visit, we discussed your persistent lower extremity joint pain, including knee osteoarthritis, hip bursitis, and ankle tendinitis. We reviewed your current symptoms and treatment options, including physical therapy and viscosupplementation.  YOUR PLAN: BILATERAL KNEE PAIN (OSTEOARTHRITIS): You have chronic bilateral knee pain due to osteoarthritis, with more pronounced pain in your left knee. -We have referred you to physical therapy for both knees. -We discussed viscosupplementation (hyaluronic acid gel injection) as a pain management option. -We reviewed x-ray evidence of bilateral knee osteoarthritis to support insurance authorization for viscosupplementation. -We initiated the insurance authorization process for viscosupplementation. -We provided you with an educational printout on viscosupplementation to share with your cardiologist. -Remember, viscosupplementation is an adjunct to, not a replacement for, physical therapy.  BILATERAL HIP PAIN (TROCHANTERIC BURSITIS): You have bilateral hip pain consistent with trochanteric bursitis. -We included both hips in the physical therapy referral for ongoing management.  PERONEAL TENDINITIS, LEFT ANKLE: You have intermittent left ankle pain due to peroneal tendinitis. -We included your left ankle in the physical therapy referral for ongoing management.  CHRONIC PAIN OF RIGHT ANKLE: You have intermittent right ankle pain, likely due to compensatory gait mechanics. -We included your right ankle in the physical therapy referral for ongoing management.    Contains text generated by [employer].   "

## 2024-06-03 NOTE — Telephone Encounter (Signed)
 Copied from CRM [id number]. Topic: Clinical - Medication Question >> Jun 03, 2024  1:19 PM Antwanette L wrote: Reason for CRM: Please send the patient prescription script of dapagliflozin  propanediol (FARXIGA ) 10 MG TABS tablet to [facility]. Please fax this information to [phone]

## 2024-06-03 NOTE — Progress Notes (Signed)
 "    Primary Care / Sports Medicine Office Visit  Patient Information:  Patient ID: Brandy [name], female DOB: 04/12/1956 Age: 69 y.o. MRN: [id number]   Brandy [name] is a pleasant 69 y.o. female presenting with the following:  Chief Complaint  Patient presents with   Follow-up    Following up on Bil hips and knee pain. Patient has attended physical therapy which has improved her symptoms and pain.     Vitals:   06/03/24 0956  BP: (!) 108/58  Pulse: 66  SpO2: 98%   Vitals:   06/03/24 0956  Weight: 234 lb (106.1 kg)  Height: 5' 6.5 (1.689 m)   Body mass index is 37.2 kg/m.     Independent interpretation of notes and tests performed by another provider:   None  Procedures performed:   None  Pertinent History, Exam, Impression, and Recommendations:   Discussed the use of AI scribe software for clinical note transcription with the patient, who gave verbal consent to proceed.  History of Present Illness   Brandy [name] is a 69 year old female with bilateral knee osteoarthritis, bilateral trochanteric bursitis, and left peroneal tendinitis who presents for evaluation of persistent lower extremity joint pain.  Bilateral Knee Pain (Osteoarthritis): - Chronic bilateral knee pain with underlying osteoarthritis - Left knee pain most pronounced when descending stairs and after prolonged sitting - Discomfort described as an awareness rather than severe pain - No regular use of analgesics; uses acetaminophen  as needed - Avoids nonsteroidal anti-inflammatory drugs due to prior adverse effects - History of two intra-articular corticosteroid injections, after which she developed tachycardic flare (h/o prior ablation), is established with cardiology - Interest in viscosupplementation for local pain control  Bilateral Hip Pain (Trochanteric Bursitis): - Bilateral hip pain consistent with trochanteric bursitis - Symptoms exacerbated by sitting and activity - Familiar with therapeutic  exercises but finds independent performance challenging - Requests resumption of physical therapy for both hips  Ankle Pain (Peroneal Tendinitis and compensatory Right Ankle Pain): - Intermittent left ankle pain attributed to peroneal tendinitis - Occasional right ankle pain, often when compensating for other joint discomfort - Appropriate shoe selection has reduced ankle symptoms - Requests inclusion of both ankles in physical therapy referral       Assessment and Plan    Bilateral primary osteoarthritis of knees Chronic bilateral knee osteoarthritis with persistent symptoms, more pronounced on the left. Conservative management effective. Viscosupplementation discussed as a local, non-systemic option. Knee arthroplasty not indicated. - Initiated referral for physical therapy for both knees. - Discussed viscosupplementation (hyaluronic acid gel injection) as a pain management option. - Reviewed x-ray evidence of bilateral knee osteoarthritis to support insurance authorization for viscosupplementation. - Initiated insurance authorization process for viscosupplementation. - Provided educational printout on viscosupplementation for her to share with her cardiologist. - Advised that viscosupplementation is adjunctive to, not a replacement for, physical therapy.  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  - Included both hips in the physical therapy referral for ongoing management.  Peroneal tendinitis, left ankle Left peroneal tendinitis with intermittent symptoms. - Included left ankle in the physical therapy referral for ongoing management.  Chronic pain of right ankle Intermittent right ankle pain, likely secondary to compensatory gait mechanics. - Included right ankle in the physical therapy referral for ongoing management.        Assessment & Plan Osteoarthritis of both knees, unspecified osteoarthritis type     Greater  trochanteric pain syndrome of  both lower extremities     Peroneal tendinitis, left     Chronic pain of right ankle      No follow-ups on file.     Brandy JINNY Ku, MD, [facility]   Primary Care Sports Medicine Primary Care and Sports Medicine at [facility]   "

## 2024-06-05 ENCOUNTER — Ambulatory Visit: Payer: Self-pay | Admitting: Cardiology

## 2024-06-06 NOTE — Progress Notes (Signed)
 Called and spoke with patient regarding pause events   Patient was aware of both episodes and told this RN that both episodes occurred while she was vomiting and that she did not pass out   Routing to [place] for update

## 2024-06-13 ENCOUNTER — Other Ambulatory Visit: Payer: Self-pay

## 2024-06-17 ENCOUNTER — Other Ambulatory Visit: Payer: Self-pay

## 2024-06-17 DIAGNOSIS — I4819 Other persistent atrial fibrillation: Secondary | ICD-10-CM

## 2024-06-17 NOTE — Telephone Encounter (Signed)
 Spoke with patient. She had her colonscopy 06/12/24. It went well. No further procedures required by GI. Patient resumed Eliquis  06/15/2024. Patient does not have any other planned procedures. Therefore arranged PVI/LAAO with Dr. [name] 07/28/2024 at 10 AM with arrival time of 730 AM. Patient aware to not miss any doses of Eliquis .   Patient will not have pre-op OV. She is aware to notify office if she develops any illness, symptoms, hospitlizations, or urgent care visits prior to procedure date.   Patient will need labs within 30 days. Information will be provided in instruction letter. Patient uses MyChart.  She was grateful for call and assistance.

## 2024-06-17 NOTE — Addendum Note (Signed)
 Addended by: WAYLON EDSEL HERO on: 06/17/2024 09:27 AM   Modules accepted: Orders

## 2024-06-24 ENCOUNTER — Ambulatory Visit

## 2024-06-25 ENCOUNTER — Encounter

## 2024-06-26 ENCOUNTER — Encounter: Admitting: Dietician

## 2024-06-27 ENCOUNTER — Ambulatory Visit

## 2024-07-01 ENCOUNTER — Ambulatory Visit

## 2024-07-08 ENCOUNTER — Ambulatory Visit

## 2024-07-15 ENCOUNTER — Ambulatory Visit

## 2024-07-22 ENCOUNTER — Ambulatory Visit

## 2024-07-26 ENCOUNTER — Encounter

## 2024-07-28 ENCOUNTER — Encounter (HOSPITAL_COMMUNITY): Payer: Self-pay

## 2024-07-28 ENCOUNTER — Inpatient Hospital Stay (HOSPITAL_COMMUNITY): Admit: 2024-07-28 | Admitting: Student in an Organized Health Care Education/Training Program

## 2024-07-28 ENCOUNTER — Ambulatory Visit

## 2024-07-29 ENCOUNTER — Ambulatory Visit

## 2024-08-05 ENCOUNTER — Ambulatory Visit

## 2024-08-12 ENCOUNTER — Ambulatory Visit

## 2024-08-26 ENCOUNTER — Encounter

## 2024-08-28 ENCOUNTER — Ambulatory Visit

## 2024-08-28 ENCOUNTER — Ambulatory Visit (HOSPITAL_COMMUNITY): Admitting: Physician Assistant

## 2024-10-07 ENCOUNTER — Encounter: Admitting: Student

## 2024-12-17 ENCOUNTER — Ambulatory Visit
